# Patient Record
Sex: Female | Born: 1950
Health system: Southern US, Community
[De-identification: ages and names within clinical notes are randomized; demographics above are authoritative.]

## PROBLEM LIST (undated history)

## (undated) DIAGNOSIS — D649 Anemia, unspecified: Secondary | ICD-10-CM

## (undated) DIAGNOSIS — I5189 Other ill-defined heart diseases: Secondary | ICD-10-CM

## (undated) DIAGNOSIS — M1990 Unspecified osteoarthritis, unspecified site: Secondary | ICD-10-CM

## (undated) DIAGNOSIS — D259 Leiomyoma of uterus, unspecified: Secondary | ICD-10-CM

## (undated) DIAGNOSIS — K5792 Diverticulitis of intestine, part unspecified, without perforation or abscess without bleeding: Secondary | ICD-10-CM

## (undated) DIAGNOSIS — G2581 Restless legs syndrome: Secondary | ICD-10-CM

## (undated) DIAGNOSIS — E559 Vitamin D deficiency, unspecified: Secondary | ICD-10-CM

## (undated) DIAGNOSIS — E039 Hypothyroidism, unspecified: Secondary | ICD-10-CM

## (undated) DIAGNOSIS — I2510 Atherosclerotic heart disease of native coronary artery without angina pectoris: Secondary | ICD-10-CM

## (undated) DIAGNOSIS — M797 Fibromyalgia: Secondary | ICD-10-CM

## (undated) DIAGNOSIS — F32A Depression, unspecified: Secondary | ICD-10-CM

## (undated) DIAGNOSIS — S22000A Wedge compression fracture of unspecified thoracic vertebra, initial encounter for closed fracture: Secondary | ICD-10-CM

## (undated) DIAGNOSIS — M4712 Other spondylosis with myelopathy, cervical region: Secondary | ICD-10-CM

## (undated) DIAGNOSIS — F419 Anxiety disorder, unspecified: Secondary | ICD-10-CM

## (undated) DIAGNOSIS — M4722 Other spondylosis with radiculopathy, cervical region: Secondary | ICD-10-CM

## (undated) DIAGNOSIS — IMO0001 Reserved for inherently not codable concepts without codable children: Secondary | ICD-10-CM

## (undated) DIAGNOSIS — E785 Hyperlipidemia, unspecified: Secondary | ICD-10-CM

## (undated) DIAGNOSIS — I639 Cerebral infarction, unspecified: Secondary | ICD-10-CM

## (undated) DIAGNOSIS — Z87442 Personal history of urinary calculi: Secondary | ICD-10-CM

## (undated) DIAGNOSIS — I82409 Acute embolism and thrombosis of unspecified deep veins of unspecified lower extremity: Secondary | ICD-10-CM

## (undated) DIAGNOSIS — I10 Essential (primary) hypertension: Secondary | ICD-10-CM

## (undated) DIAGNOSIS — N809 Endometriosis, unspecified: Secondary | ICD-10-CM

## (undated) DIAGNOSIS — Z9889 Other specified postprocedural states: Secondary | ICD-10-CM

## (undated) DIAGNOSIS — R112 Nausea with vomiting, unspecified: Secondary | ICD-10-CM

## (undated) DIAGNOSIS — I351 Nonrheumatic aortic (valve) insufficiency: Secondary | ICD-10-CM

## (undated) DIAGNOSIS — Z5189 Encounter for other specified aftercare: Secondary | ICD-10-CM

## (undated) DIAGNOSIS — J189 Pneumonia, unspecified organism: Secondary | ICD-10-CM

## (undated) DIAGNOSIS — F329 Major depressive disorder, single episode, unspecified: Secondary | ICD-10-CM

## (undated) DIAGNOSIS — K219 Gastro-esophageal reflux disease without esophagitis: Secondary | ICD-10-CM

## (undated) DIAGNOSIS — G4700 Insomnia, unspecified: Secondary | ICD-10-CM

## (undated) DIAGNOSIS — Z8489 Family history of other specified conditions: Secondary | ICD-10-CM

## (undated) DIAGNOSIS — N189 Chronic kidney disease, unspecified: Secondary | ICD-10-CM

## (undated) DIAGNOSIS — R519 Headache, unspecified: Secondary | ICD-10-CM

## (undated) DIAGNOSIS — I509 Heart failure, unspecified: Secondary | ICD-10-CM

## (undated) DIAGNOSIS — N83209 Unspecified ovarian cyst, unspecified side: Secondary | ICD-10-CM

## (undated) DIAGNOSIS — K449 Diaphragmatic hernia without obstruction or gangrene: Secondary | ICD-10-CM

## (undated) DIAGNOSIS — D374 Neoplasm of uncertain behavior of colon: Secondary | ICD-10-CM

## (undated) DIAGNOSIS — N390 Urinary tract infection, site not specified: Secondary | ICD-10-CM

## (undated) HISTORY — DX: Endometriosis, unspecified: N80.9

## (undated) HISTORY — DX: Unspecified osteoarthritis, unspecified site: M19.90

## (undated) HISTORY — DX: Acute embolism and thrombosis of unspecified deep veins of unspecified lower extremity: I82.409

## (undated) HISTORY — DX: Vitamin D deficiency, unspecified: E55.9

## (undated) HISTORY — DX: Atherosclerotic heart disease of native coronary artery without angina pectoris: I25.10

## (undated) HISTORY — DX: Diaphragmatic hernia without obstruction or gangrene: K44.9

## (undated) HISTORY — DX: Anemia, unspecified: D64.9

## (undated) HISTORY — DX: Insomnia, unspecified: G47.00

## (undated) HISTORY — DX: Unspecified ovarian cyst, unspecified side: N83.209

## (undated) HISTORY — DX: Hyperlipidemia, unspecified: E78.5

## (undated) HISTORY — PX: TUBAL LIGATION: SHX77

## (undated) HISTORY — PX: OTHER SURGICAL HISTORY: SHX169

## (undated) HISTORY — DX: Restless legs syndrome: G25.81

## (undated) HISTORY — PX: JOINT REPLACEMENT: SHX530

## (undated) HISTORY — DX: Nonrheumatic aortic (valve) insufficiency: I35.1

## (undated) HISTORY — DX: Wedge compression fracture of unspecified thoracic vertebra, initial encounter for closed fracture: S22.000A

## (undated) HISTORY — DX: Other ill-defined heart diseases: I51.89

## (undated) HISTORY — DX: Diverticulitis of intestine, part unspecified, without perforation or abscess without bleeding: K57.92

## (undated) HISTORY — PX: CHOLECYSTECTOMY: SHX55

## (undated) HISTORY — PX: SHOULDER OPEN ROTATOR CUFF REPAIR: SHX2407

## (undated) HISTORY — PX: REDUCTION MAMMAPLASTY: SUR839

## (undated) HISTORY — PX: BREAST SURGERY: SHX581

## (undated) HISTORY — DX: Leiomyoma of uterus, unspecified: D25.9

## (undated) HISTORY — PX: APPENDECTOMY: SHX54

## (undated) HISTORY — PX: EYE SURGERY: SHX253

## (undated) HISTORY — PX: DILATION AND CURETTAGE OF UTERUS: SHX78

## (undated) HISTORY — PX: KNEE ARTHROPLASTY: SHX992

---

## 1997-12-05 ENCOUNTER — Ambulatory Visit (HOSPITAL_COMMUNITY): Admission: RE | Admit: 1997-12-05 | Discharge: 1997-12-05 | Payer: Self-pay | Admitting: Family Medicine

## 1998-01-06 ENCOUNTER — Encounter: Admission: RE | Admit: 1998-01-06 | Discharge: 1998-04-06 | Payer: Self-pay | Admitting: Family Medicine

## 1998-06-02 ENCOUNTER — Ambulatory Visit (HOSPITAL_COMMUNITY): Admission: RE | Admit: 1998-06-02 | Discharge: 1998-06-02 | Payer: Self-pay | Admitting: *Deleted

## 1999-06-14 ENCOUNTER — Encounter: Admission: RE | Admit: 1999-06-14 | Discharge: 1999-06-14 | Payer: Self-pay | Admitting: Family Medicine

## 1999-06-14 ENCOUNTER — Encounter: Payer: Self-pay | Admitting: Family Medicine

## 1999-12-15 ENCOUNTER — Other Ambulatory Visit: Admission: RE | Admit: 1999-12-15 | Discharge: 1999-12-15 | Payer: Self-pay | Admitting: Obstetrics and Gynecology

## 2000-01-04 ENCOUNTER — Encounter (HOSPITAL_COMMUNITY): Admission: RE | Admit: 2000-01-04 | Discharge: 2000-04-03 | Payer: Self-pay | Admitting: Obstetrics and Gynecology

## 2000-05-23 ENCOUNTER — Ambulatory Visit (HOSPITAL_COMMUNITY): Admission: RE | Admit: 2000-05-23 | Discharge: 2000-05-23 | Payer: Self-pay | Admitting: *Deleted

## 2000-06-19 ENCOUNTER — Encounter: Payer: Self-pay | Admitting: Family Medicine

## 2000-06-19 ENCOUNTER — Encounter: Admission: RE | Admit: 2000-06-19 | Discharge: 2000-06-19 | Payer: Self-pay | Admitting: Family Medicine

## 2000-10-04 ENCOUNTER — Encounter: Admission: RE | Admit: 2000-10-04 | Discharge: 2001-01-02 | Payer: Self-pay | Admitting: Family Medicine

## 2000-12-13 ENCOUNTER — Other Ambulatory Visit: Admission: RE | Admit: 2000-12-13 | Discharge: 2000-12-13 | Payer: Self-pay | Admitting: Obstetrics and Gynecology

## 2000-12-27 ENCOUNTER — Encounter: Payer: Self-pay | Admitting: Obstetrics and Gynecology

## 2001-01-02 ENCOUNTER — Encounter (INDEPENDENT_AMBULATORY_CARE_PROVIDER_SITE_OTHER): Payer: Self-pay

## 2001-01-02 ENCOUNTER — Ambulatory Visit (HOSPITAL_COMMUNITY): Admission: RE | Admit: 2001-01-02 | Discharge: 2001-01-02 | Payer: Self-pay | Admitting: Obstetrics and Gynecology

## 2001-06-25 ENCOUNTER — Encounter: Admission: RE | Admit: 2001-06-25 | Discharge: 2001-06-25 | Payer: Self-pay | Admitting: Family Medicine

## 2001-06-25 ENCOUNTER — Encounter: Payer: Self-pay | Admitting: Family Medicine

## 2001-12-17 ENCOUNTER — Other Ambulatory Visit: Admission: RE | Admit: 2001-12-17 | Discharge: 2001-12-17 | Payer: Self-pay | Admitting: Obstetrics and Gynecology

## 2002-06-26 ENCOUNTER — Encounter: Payer: Self-pay | Admitting: Family Medicine

## 2002-06-26 ENCOUNTER — Encounter: Admission: RE | Admit: 2002-06-26 | Discharge: 2002-06-26 | Payer: Self-pay | Admitting: Family Medicine

## 2002-10-30 ENCOUNTER — Encounter: Payer: Self-pay | Admitting: Neurosurgery

## 2002-10-30 ENCOUNTER — Encounter: Admission: RE | Admit: 2002-10-30 | Discharge: 2002-10-30 | Payer: Self-pay | Admitting: Neurosurgery

## 2002-11-04 ENCOUNTER — Encounter: Admission: RE | Admit: 2002-11-04 | Discharge: 2002-12-04 | Payer: Self-pay | Admitting: Neurosurgery

## 2002-11-13 ENCOUNTER — Encounter: Admission: RE | Admit: 2002-11-13 | Discharge: 2002-11-13 | Payer: Self-pay | Admitting: Neurosurgery

## 2002-11-13 ENCOUNTER — Encounter: Payer: Self-pay | Admitting: Neurosurgery

## 2002-11-27 ENCOUNTER — Encounter: Admission: RE | Admit: 2002-11-27 | Discharge: 2002-11-27 | Payer: Self-pay | Admitting: Neurosurgery

## 2002-11-27 ENCOUNTER — Encounter: Payer: Self-pay | Admitting: Neurosurgery

## 2003-01-07 ENCOUNTER — Other Ambulatory Visit: Admission: RE | Admit: 2003-01-07 | Discharge: 2003-01-07 | Payer: Self-pay | Admitting: Obstetrics and Gynecology

## 2003-03-24 ENCOUNTER — Encounter: Admission: RE | Admit: 2003-03-24 | Discharge: 2003-04-17 | Payer: Self-pay | Admitting: Neurosurgery

## 2003-05-14 ENCOUNTER — Ambulatory Visit (HOSPITAL_COMMUNITY): Admission: RE | Admit: 2003-05-14 | Discharge: 2003-05-14 | Payer: Self-pay | Admitting: Orthopedic Surgery

## 2003-05-14 ENCOUNTER — Ambulatory Visit (HOSPITAL_BASED_OUTPATIENT_CLINIC_OR_DEPARTMENT_OTHER): Admission: RE | Admit: 2003-05-14 | Discharge: 2003-05-14 | Payer: Self-pay | Admitting: Orthopedic Surgery

## 2003-05-26 ENCOUNTER — Encounter: Admission: RE | Admit: 2003-05-26 | Discharge: 2003-06-24 | Payer: Self-pay | Admitting: Orthopedic Surgery

## 2003-06-30 ENCOUNTER — Encounter: Admission: RE | Admit: 2003-06-30 | Discharge: 2003-06-30 | Payer: Self-pay | Admitting: Family Medicine

## 2003-08-09 HISTORY — PX: BACK SURGERY: SHX140

## 2003-09-01 ENCOUNTER — Inpatient Hospital Stay (HOSPITAL_COMMUNITY): Admission: RE | Admit: 2003-09-01 | Discharge: 2003-09-05 | Payer: Self-pay | Admitting: Neurosurgery

## 2004-01-12 ENCOUNTER — Other Ambulatory Visit: Admission: RE | Admit: 2004-01-12 | Discharge: 2004-01-12 | Payer: Self-pay | Admitting: Obstetrics and Gynecology

## 2004-08-24 ENCOUNTER — Encounter: Admission: RE | Admit: 2004-08-24 | Discharge: 2004-08-24 | Payer: Self-pay | Admitting: Family Medicine

## 2004-09-10 ENCOUNTER — Ambulatory Visit (HOSPITAL_COMMUNITY): Admission: RE | Admit: 2004-09-10 | Discharge: 2004-09-10 | Payer: Self-pay | Admitting: Orthopedic Surgery

## 2004-09-10 ENCOUNTER — Ambulatory Visit (HOSPITAL_BASED_OUTPATIENT_CLINIC_OR_DEPARTMENT_OTHER): Admission: RE | Admit: 2004-09-10 | Discharge: 2004-09-10 | Payer: Self-pay | Admitting: Orthopedic Surgery

## 2005-06-08 ENCOUNTER — Ambulatory Visit (HOSPITAL_COMMUNITY): Admission: RE | Admit: 2005-06-08 | Discharge: 2005-06-08 | Payer: Self-pay | Admitting: Orthopedic Surgery

## 2005-06-17 ENCOUNTER — Observation Stay (HOSPITAL_COMMUNITY): Admission: EM | Admit: 2005-06-17 | Discharge: 2005-06-17 | Payer: Self-pay | Admitting: Emergency Medicine

## 2005-09-02 ENCOUNTER — Encounter: Admission: RE | Admit: 2005-09-02 | Discharge: 2005-09-02 | Payer: Self-pay | Admitting: Obstetrics and Gynecology

## 2005-10-06 ENCOUNTER — Other Ambulatory Visit: Admission: RE | Admit: 2005-10-06 | Discharge: 2005-10-06 | Payer: Self-pay | Admitting: Obstetrics & Gynecology

## 2005-10-27 ENCOUNTER — Encounter: Admission: RE | Admit: 2005-10-27 | Discharge: 2005-10-27 | Payer: Self-pay | Admitting: Obstetrics and Gynecology

## 2006-01-04 ENCOUNTER — Ambulatory Visit (HOSPITAL_BASED_OUTPATIENT_CLINIC_OR_DEPARTMENT_OTHER): Admission: RE | Admit: 2006-01-04 | Discharge: 2006-01-04 | Payer: Self-pay | Admitting: Orthopedic Surgery

## 2006-09-13 ENCOUNTER — Ambulatory Visit (HOSPITAL_BASED_OUTPATIENT_CLINIC_OR_DEPARTMENT_OTHER): Admission: RE | Admit: 2006-09-13 | Discharge: 2006-09-13 | Payer: Self-pay | Admitting: Orthopedic Surgery

## 2006-10-23 ENCOUNTER — Other Ambulatory Visit: Admission: RE | Admit: 2006-10-23 | Discharge: 2006-10-23 | Payer: Self-pay | Admitting: Obstetrics & Gynecology

## 2006-11-09 ENCOUNTER — Encounter: Admission: RE | Admit: 2006-11-09 | Discharge: 2006-11-09 | Payer: Self-pay | Admitting: Obstetrics and Gynecology

## 2007-04-15 ENCOUNTER — Encounter (INDEPENDENT_AMBULATORY_CARE_PROVIDER_SITE_OTHER): Payer: Self-pay | Admitting: Surgery

## 2007-04-15 ENCOUNTER — Observation Stay (HOSPITAL_COMMUNITY): Admission: EM | Admit: 2007-04-15 | Discharge: 2007-04-16 | Payer: Self-pay | Admitting: Emergency Medicine

## 2007-08-09 HISTORY — PX: KNEE ARTHROPLASTY: SHX992

## 2008-01-15 ENCOUNTER — Other Ambulatory Visit: Admission: RE | Admit: 2008-01-15 | Discharge: 2008-01-15 | Payer: Self-pay | Admitting: Obstetrics and Gynecology

## 2008-02-20 ENCOUNTER — Encounter: Admission: RE | Admit: 2008-02-20 | Discharge: 2008-02-20 | Payer: Self-pay | Admitting: Obstetrics and Gynecology

## 2009-02-20 ENCOUNTER — Encounter: Admission: RE | Admit: 2009-02-20 | Discharge: 2009-02-20 | Payer: Self-pay | Admitting: Obstetrics and Gynecology

## 2009-02-26 ENCOUNTER — Encounter: Admission: RE | Admit: 2009-02-26 | Discharge: 2009-02-26 | Payer: Self-pay | Admitting: Obstetrics and Gynecology

## 2009-03-09 ENCOUNTER — Inpatient Hospital Stay (HOSPITAL_COMMUNITY): Admission: RE | Admit: 2009-03-09 | Discharge: 2009-03-10 | Payer: Self-pay | Admitting: Orthopedic Surgery

## 2009-03-10 ENCOUNTER — Encounter (INDEPENDENT_AMBULATORY_CARE_PROVIDER_SITE_OTHER): Payer: Self-pay | Admitting: Orthopedic Surgery

## 2009-03-10 ENCOUNTER — Ambulatory Visit: Payer: Self-pay | Admitting: Surgery

## 2009-08-31 ENCOUNTER — Inpatient Hospital Stay (HOSPITAL_COMMUNITY): Admission: RE | Admit: 2009-08-31 | Discharge: 2009-09-03 | Payer: Self-pay | Admitting: Orthopedic Surgery

## 2009-09-18 ENCOUNTER — Ambulatory Visit (HOSPITAL_COMMUNITY): Admission: RE | Admit: 2009-09-18 | Discharge: 2009-09-18 | Payer: Self-pay | Admitting: Orthopedic Surgery

## 2010-03-04 ENCOUNTER — Encounter: Admission: RE | Admit: 2010-03-04 | Discharge: 2010-03-04 | Payer: Self-pay | Admitting: Obstetrics and Gynecology

## 2010-07-15 ENCOUNTER — Emergency Department (HOSPITAL_COMMUNITY): Admission: EM | Admit: 2010-07-15 | Discharge: 2010-03-20 | Payer: Self-pay | Admitting: Emergency Medicine

## 2010-08-29 ENCOUNTER — Encounter: Payer: Self-pay | Admitting: Obstetrics and Gynecology

## 2010-10-22 LAB — DIFFERENTIAL
Eosinophils Absolute: 0.1 10*3/uL (ref 0.0–0.7)
Eosinophils Relative: 1 % (ref 0–5)
Lymphs Abs: 1.7 10*3/uL (ref 0.7–4.0)
Monocytes Relative: 4 % (ref 3–12)

## 2010-10-22 LAB — URINALYSIS, ROUTINE W REFLEX MICROSCOPIC
Bilirubin Urine: NEGATIVE
Glucose, UA: >1000 mg/dL — AB
Hgb urine dipstick: NEGATIVE
Nitrite: POSITIVE — AB
Protein, ur: NEGATIVE mg/dL
Specific Gravity, Urine: 1.017 (ref 1.005–1.030)
Urobilinogen, UA: 0.2 mg/dL (ref 0.0–1.0)
pH: 7.5 (ref 5.0–8.0)

## 2010-10-22 LAB — URINE CULTURE

## 2010-10-22 LAB — ABO/RH: ABO/RH(D): O POS

## 2010-10-22 LAB — CBC
HCT: 37.3 % (ref 36.0–46.0)
Hemoglobin: 12.6 g/dL (ref 12.0–15.0)
MCH: 28.3 pg (ref 26.0–34.0)
MCV: 83.7 fL (ref 78.0–100.0)
RBC: 4.46 MIL/uL (ref 3.87–5.11)

## 2010-10-22 LAB — TYPE AND SCREEN: Antibody Screen: NEGATIVE

## 2010-10-22 LAB — URINE MICROSCOPIC-ADD ON

## 2010-10-22 LAB — POCT I-STAT, CHEM 8
Calcium, Ion: 1.09 mmol/L — ABNORMAL LOW (ref 1.12–1.32)
Chloride: 102 mEq/L (ref 96–112)
HCT: 39 % (ref 36.0–46.0)
Hemoglobin: 13.3 g/dL (ref 12.0–15.0)
Potassium: 3.6 mEq/L (ref 3.5–5.1)

## 2010-10-22 LAB — GLUCOSE, CAPILLARY: Glucose-Capillary: 235 mg/dL — ABNORMAL HIGH (ref 70–99)

## 2010-10-24 LAB — CBC
HCT: 26.3 % — ABNORMAL LOW (ref 36.0–46.0)
HCT: 28.4 % — ABNORMAL LOW (ref 36.0–46.0)
HCT: 32.2 % — ABNORMAL LOW (ref 36.0–46.0)
HCT: 38.7 % (ref 36.0–46.0)
Hemoglobin: 9.6 g/dL — ABNORMAL LOW (ref 12.0–15.0)
MCHC: 33.8 g/dL (ref 30.0–36.0)
MCV: 83.8 fL (ref 78.0–100.0)
MCV: 84 fL (ref 78.0–100.0)
MCV: 84.3 fL (ref 78.0–100.0)
Platelets: 162 10*3/uL (ref 150–400)
Platelets: 267 10*3/uL (ref 150–400)
RBC: 3.39 MIL/uL — ABNORMAL LOW (ref 3.87–5.11)
RBC: 3.83 MIL/uL — ABNORMAL LOW (ref 3.87–5.11)
RDW: 13.1 % (ref 11.5–15.5)
WBC: 5.4 10*3/uL (ref 4.0–10.5)
WBC: 7 10*3/uL (ref 4.0–10.5)
WBC: 9.4 10*3/uL (ref 4.0–10.5)

## 2010-10-24 LAB — BASIC METABOLIC PANEL
BUN: 8 mg/dL (ref 6–23)
CO2: 27 mEq/L (ref 19–32)
Chloride: 100 mEq/L (ref 96–112)
Chloride: 102 mEq/L (ref 96–112)
Chloride: 99 mEq/L (ref 96–112)
GFR calc Af Amer: >60 mL/min (ref >60)
GFR calc Af Amer: >60 mL/min (ref >60)
GFR calc non Af Amer: >60 mL/min (ref >60)
Glucose, Bld: 258 mg/dL — ABNORMAL HIGH (ref 70–99)
Potassium: 3.5 mEq/L (ref 3.5–5.1)
Potassium: 3.8 mEq/L (ref 3.5–5.1)
Potassium: 4 mEq/L (ref 3.5–5.1)
Sodium: 135 mEq/L (ref 135–145)

## 2010-10-24 LAB — CROSSMATCH: ABO/RH(D): O POS

## 2010-10-24 LAB — GLUCOSE, CAPILLARY
Glucose-Capillary: 165 mg/dL — ABNORMAL HIGH (ref 70–99)
Glucose-Capillary: 226 mg/dL — ABNORMAL HIGH (ref 70–99)
Glucose-Capillary: 239 mg/dL — ABNORMAL HIGH (ref 70–99)
Glucose-Capillary: 302 mg/dL — ABNORMAL HIGH (ref 70–99)
Glucose-Capillary: 316 mg/dL — ABNORMAL HIGH (ref 70–99)

## 2010-10-24 LAB — COMPREHENSIVE METABOLIC PANEL
ALT: 15 U/L (ref 0–35)
AST: 17 U/L (ref 0–37)
Albumin: 3.9 g/dL (ref 3.5–5.2)
Alkaline Phosphatase: 90 U/L (ref 39–117)
BUN: 10 mg/dL (ref 6–23)
Chloride: 100 mEq/L (ref 96–112)
Potassium: 4.6 mEq/L (ref 3.5–5.1)
Sodium: 135 mEq/L (ref 135–145)
Total Bilirubin: 0.5 mg/dL (ref 0.3–1.2)
Total Protein: 6.7 g/dL (ref 6.0–8.3)

## 2010-10-24 LAB — URINALYSIS, ROUTINE W REFLEX MICROSCOPIC
Bilirubin Urine: NEGATIVE
Glucose, UA: >1000 mg/dL — AB
Hgb urine dipstick: NEGATIVE
Ketones, ur: 15 mg/dL — AB
Nitrite: NEGATIVE
Specific Gravity, Urine: 1.036 — ABNORMAL HIGH (ref 1.005–1.030)
pH: 5 (ref 5.0–8.0)

## 2010-10-24 LAB — DIFFERENTIAL
Basophils Absolute: 0 10*3/uL (ref 0.0–0.1)
Basophils Relative: 0 % (ref 0–1)
Eosinophils Absolute: 0.1 10*3/uL (ref 0.0–0.7)
Eosinophils Relative: 2 % (ref 0–5)
Monocytes Absolute: 0.4 10*3/uL (ref 0.1–1.0)
Monocytes Relative: 6 % (ref 3–12)
Neutro Abs: 4.5 10*3/uL (ref 1.7–7.7)

## 2010-10-24 LAB — URINE CULTURE: Colony Count: NO GROWTH

## 2010-10-24 LAB — URINE MICROSCOPIC-ADD ON

## 2010-11-09 ENCOUNTER — Emergency Department (HOSPITAL_COMMUNITY): Payer: 59

## 2010-11-09 ENCOUNTER — Emergency Department (HOSPITAL_COMMUNITY)
Admission: EM | Admit: 2010-11-09 | Discharge: 2010-11-09 | Disposition: A | Discharge status: Home / Self Care | Payer: 59 | Attending: Emergency Medicine | Admitting: Emergency Medicine

## 2010-11-09 DIAGNOSIS — R000 Tachycardia, unspecified: Secondary | ICD-10-CM | POA: Insufficient documentation

## 2010-11-09 DIAGNOSIS — M549 Dorsalgia, unspecified: Secondary | ICD-10-CM | POA: Insufficient documentation

## 2010-11-09 DIAGNOSIS — Z794 Long term (current) use of insulin: Secondary | ICD-10-CM | POA: Insufficient documentation

## 2010-11-09 DIAGNOSIS — N3090 Cystitis, unspecified without hematuria: Secondary | ICD-10-CM | POA: Insufficient documentation

## 2010-11-09 DIAGNOSIS — Z7982 Long term (current) use of aspirin: Secondary | ICD-10-CM | POA: Insufficient documentation

## 2010-11-09 DIAGNOSIS — E119 Type 2 diabetes mellitus without complications: Secondary | ICD-10-CM | POA: Insufficient documentation

## 2010-11-09 DIAGNOSIS — R109 Unspecified abdominal pain: Secondary | ICD-10-CM | POA: Insufficient documentation

## 2010-11-09 DIAGNOSIS — R110 Nausea: Secondary | ICD-10-CM | POA: Insufficient documentation

## 2010-11-09 DIAGNOSIS — E039 Hypothyroidism, unspecified: Secondary | ICD-10-CM | POA: Insufficient documentation

## 2010-11-09 DIAGNOSIS — M1990 Unspecified osteoarthritis, unspecified site: Secondary | ICD-10-CM | POA: Insufficient documentation

## 2010-11-09 DIAGNOSIS — Z79899 Other long term (current) drug therapy: Secondary | ICD-10-CM | POA: Insufficient documentation

## 2010-11-09 DIAGNOSIS — I10 Essential (primary) hypertension: Secondary | ICD-10-CM | POA: Insufficient documentation

## 2010-11-09 LAB — DIFFERENTIAL
Basophils Relative: 0 % (ref 0–1)
Lymphs Abs: 1.2 10*3/uL (ref 0.7–4.0)
Monocytes Relative: 8 % (ref 3–12)
Neutro Abs: 5.8 10*3/uL (ref 1.7–7.7)
Neutrophils Relative %: 75 % (ref 43–77)

## 2010-11-09 LAB — URINE MICROSCOPIC-ADD ON

## 2010-11-09 LAB — BASIC METABOLIC PANEL
BUN: 12 mg/dL (ref 6–23)
Calcium: 9.6 mg/dL (ref 8.4–10.5)
Creatinine, Ser: 0.8 mg/dL (ref 0.4–1.2)
GFR calc non Af Amer: >60 mL/min (ref >60)
Potassium: 3.9 mEq/L (ref 3.5–5.1)

## 2010-11-09 LAB — CBC
Hemoglobin: 13.5 g/dL (ref 12.0–15.0)
MCH: 28 pg (ref 26.0–34.0)
RBC: 4.83 MIL/uL (ref 3.87–5.11)
WBC: 7.7 10*3/uL (ref 4.0–10.5)

## 2010-11-09 LAB — URINALYSIS, ROUTINE W REFLEX MICROSCOPIC
Glucose, UA: NEGATIVE mg/dL
Ketones, ur: 15 mg/dL — AB
Specific Gravity, Urine: 1.023 (ref 1.005–1.030)
pH: 7 (ref 5.0–8.0)

## 2010-11-12 LAB — URINE CULTURE

## 2010-11-13 LAB — CBC
HCT: 31.8 % — ABNORMAL LOW (ref 36.0–46.0)
Hemoglobin: 10.9 g/dL — ABNORMAL LOW (ref 12.0–15.0)
MCV: 83.1 fL (ref 78.0–100.0)
Platelets: 201 10*3/uL (ref 150–400)
RDW: 13.1 % (ref 11.5–15.5)

## 2010-11-13 LAB — GLUCOSE, CAPILLARY
Glucose-Capillary: 200 mg/dL — ABNORMAL HIGH (ref 70–99)
Glucose-Capillary: 243 mg/dL — ABNORMAL HIGH (ref 70–99)
Glucose-Capillary: 338 mg/dL — ABNORMAL HIGH (ref 70–99)
Glucose-Capillary: 342 mg/dL — ABNORMAL HIGH (ref 70–99)

## 2010-11-13 LAB — BASIC METABOLIC PANEL
BUN: 14 mg/dL (ref 6–23)
CO2: 28 mEq/L (ref 19–32)
Chloride: 100 mEq/L (ref 96–112)
GFR calc non Af Amer: >60 mL/min (ref >60)
Glucose, Bld: 363 mg/dL — ABNORMAL HIGH (ref 70–99)
Potassium: 4.2 mEq/L (ref 3.5–5.1)
Sodium: 135 mEq/L (ref 135–145)

## 2010-11-14 LAB — CROSSMATCH

## 2010-11-14 LAB — COMPREHENSIVE METABOLIC PANEL
Alkaline Phosphatase: 91 U/L (ref 39–117)
BUN: 13 mg/dL (ref 6–23)
Chloride: 100 mEq/L (ref 96–112)
Creatinine, Ser: 0.76 mg/dL (ref 0.4–1.2)
GFR calc non Af Amer: >60 mL/min (ref >60)
Glucose, Bld: 89 mg/dL (ref 70–99)
Potassium: 3.9 mEq/L (ref 3.5–5.1)
Total Bilirubin: 0.5 mg/dL (ref 0.3–1.2)

## 2010-11-14 LAB — DIFFERENTIAL
Basophils Absolute: 0 10*3/uL (ref 0.0–0.1)
Basophils Relative: 0 % (ref 0–1)
Lymphocytes Relative: 35 % (ref 12–46)
Neutro Abs: 4.3 10*3/uL (ref 1.7–7.7)
Neutrophils Relative %: 56 % (ref 43–77)

## 2010-11-14 LAB — URINE CULTURE

## 2010-11-14 LAB — URINALYSIS, ROUTINE W REFLEX MICROSCOPIC
Bilirubin Urine: NEGATIVE
Ketones, ur: NEGATIVE mg/dL
Nitrite: NEGATIVE
Protein, ur: NEGATIVE mg/dL
pH: 6 (ref 5.0–8.0)

## 2010-11-14 LAB — PROTIME-INR
INR: 0.9 (ref 0.00–1.49)
Prothrombin Time: 12.3 seconds (ref 11.6–15.2)

## 2010-11-14 LAB — URINE MICROSCOPIC-ADD ON

## 2010-11-14 LAB — CBC
HCT: 40.3 % (ref 36.0–46.0)
Hemoglobin: 13.7 g/dL (ref 12.0–15.0)
MCV: 83.9 fL (ref 78.0–100.0)
WBC: 7.6 10*3/uL (ref 4.0–10.5)

## 2010-11-14 LAB — ABO/RH: ABO/RH(D): O POS

## 2010-11-14 LAB — APTT: aPTT: 29 seconds (ref 24–37)

## 2010-12-21 NOTE — Op Note (Signed)
[name], [name]                 ACCOUNT NO.:  [id number]   MEDICAL RECORD NO.:  [id number]          PATIENT TYPE:  INP   LOCATION:  1823                         FACILITY:  [facility]   PHYSICIAN:  Thornton Park. Daphine Deutscher, MD  DATE OF BIRTH:  [birth year]   DATE OF PROCEDURE:  04/15/2007  DATE OF DISCHARGE:                               OPERATIVE REPORT   PREOPERATIVE DIAGNOSIS:  A 60 year old white female with underlying  diabetes, hypothyroidism and hypertension with acute appendicitis.  She  has had multiple previous surgeries.   PROCEDURE:  Laparoscopic appendectomy.   FINDINGS:  Acute suppurative appendicitis.   SURGEON:  Luretha Murphy, M.D.   ANESTHESIA:  General endotracheal.   DESCRIPTION OF PROCEDURE:  [name] was taken from emergency room up  to room 11 on Sunday evening April 15, 2007.  She received a gram of  Mefoxin preop.  The abdomen was prepped with Techni-Care and draped  sterilely.  A longitudinal incision was made down into the umbilicus  near an old incision.  Hasson cannula was placed without difficulty.  The abdomen was insufflated.  A 5 mm was placed in the right upper  quadrant and 11 was placed in the left lower quadrant.  This was  subsequently visualized from the right side with a 5 mm scope just to  see because there was an adhesion nearby.  This was well away from any  bowel.   The appendix was mobilized anteriorly and the base was isolated and was  transected with the Endo-GIA vascular cartridge.  Remaining portion of  the mesentery of appendix was transected with a harmonic scalpel and  appendix was placed in a bag and brought out through the umbilicus.  Port sites were all injected with Marcaine.  Surveyed this area again  and no bleeding was noted.  The abdomen was deflated after closing the  umbilical defect with figure-of-eight suture of zero Vicryl.  Wounds  were closed 4-0 Vicryl and Steri-Strips.  The patient seemed to tolerate  the  procedure well.  She was taken to recovery room in satisfactory  condition.      Thornton Park Daphine Deutscher, MD  Electronically Signed     MBM/MEDQ  D:  04/15/2007  T:  04/16/2007  Job:  [id number]

## 2010-12-21 NOTE — Op Note (Signed)
NAMEJENIN, [name]                 ACCOUNT NO.:  [id number]   MEDICAL RECORD NO.:  [id number]          PATIENT TYPE:  INP   LOCATION:  5002                         FACILITY:  [facility]   PHYSICIAN:  Mila Homer. Sherlean Foot, M.D. DATE OF BIRTH:  06/25/1952   DATE OF PROCEDURE:  03/09/2009  DATE OF DISCHARGE:                               OPERATIVE REPORT   SURGEON:  Mila Homer. Sherlean Foot, MD   ASSISTANTS:  Altamese Cabal, PA-C and Skip Mayer, PA-C   ANESTHESIA:  General.   PREOPERATIVE DIAGNOSIS:  Left medial compartment osteoarthritis.   POSTOPERATIVE DIAGNOSIS:  Left medial compartment osteoarthritis.   PROCEDURE:  Left knee unicompartmental arthroplasty.   INDICATIONS FOR PROCEDURE:  The patient is a 60 year old white female  who has failed conservative measures for osteoarthritis of the left  knee.  Informed consent was obtained.   DESCRIPTION OF PROCEDURE:  The patient was taken to the operating room.  After general anesthesia and a Foley catheter was placed, the leg was  prepped and draped in usual sterile fashion.  Exsanguinated the  extremity with the Esmarch and midline incision was made from the tibial  tubercle to the inferior pole of the patella approximately 5 cm in  length.  A new blade was used to make a median parapatellar arthrotomy  and perform a synovectomy.  I then placed a tibial anatomic axis guide  with a long alignment rod and got the C-arm out taking AP images to  ensure I was through the mechanical axis with tension on the knee  correcting it slightly from its varus orientation and pinned it into the  distal femoral proximal tibial cutting blocks.  I then removed the  alignment guide to make the tibial cut and the femoral cut.  I then went  into flexion made the tibial cut.  I then removed all of the cutting  guides.  I then sized the femur to a D, pinned the cutting guide into  place and made the chamfer and posterior condylar cuts.  I removed the  cut surfaces  and the guide.  I then checked the flexion/extension gap  balance with the spacer block.  I then finished the tibia with a size 3  tibial tray, then trialed with a 3 tibia, 8 poly, D femur and I got good  flexion/extension, gap balance and good alignment.  I then removed the  trial components and copiously irrigated.  I then cemented the  components and removed all excess cement, snapped the polyethylene in  place and allowed the cement to harden in extension.  I then let the  tourniquet down, irrigated and obtained hemostasis.  Closed the  arthrotomy with figure-of-eight #1 Vicryl sutures, #1 Vicryl sutures in  the arthrotomy, buried 0s, subcuticular 2-0 Steri-Strips, infiltrated  the capsule with Marcaine and morphine mixture.   TOURNIQUET TIME:  65 minutes.   COMPLICATIONS:  None.   DRAINS:  None.           ______________________________  Mila Homer. Sherlean Foot, M.D.     SDL/MEDQ  D:  03/09/2009  T:  03/10/2009  Job:  [id number]

## 2010-12-24 NOTE — Procedures (Signed)
[facility]  Patient:    [name], [name]                        MRN: [id number] Proc. Date: 05/23/00 Adm. Date:  [id number] Attending:  Mingo Amber CC:         [name]. Cliffton Asters, M.D.   Procedure Report  PROCEDURES:  Video upper endoscopy and video colonoscopy.  INDICATIONS:  Persisting iron deficiency anemia in a 60 year old diabetic female without symptoms.  It should be noted that antibody test for celiac sprue was negative.  Recent hemoglobin was 8.7 with an MCV of 59.7.  PREPARATION:  The patient is n.p.o. since midnight, having taken Phospho-Soda prep and a clear liquid diet.  The mucosa throughout is clean.  PREPROCEDURE SEDATION:  Prior to the colonoscopy, she received 75 mg of Demerol and 8 mg of Versed intravenously.  In addition, she was on 2 L of nasal cannula O2.  DESCRIPTION OF PROCEDURE:  The Olympus video colonoscope was inserted via the rectum and advanced quite easily all the way to the cecum.  Cecal landmarks were identified and photographed.  On withdrawal, the mucosa was carefully evaluated and found to be virtually entirely normal.  There were minimal diverticula noted on the left colon.  There were no areas of inflammation, neoplasia, vascular dysplasia, or other abnormality.  Retroflexed view of the rectum was unremarkable.  The patient tolerated this procedure well.  At its conclusion, her position was reversed for procedure #2, video upper endoscopy. She received an additional 2 mg of Versed and 25 mg of Demerol.  In addition, her throat was anesthetized with Hurricaine spray.  The Olympus video upper endoscope was inserted via the mouth and advanced easily through the upper esophageal sphincter.  Intubation was carried out well into the descending duodenum.  On withdrawal, the mucosa was carefully evaluated.  Descending duodenum and bulb appeared normal, as did the entire stomach and esophagus. There were no areas of  peptic inflammation, vascular abnormality, or bleeding noted.  The patient tolerated the procedure well.  Pulse, blood pressure, and oximetry testing were stable throughout.  She will be observed in recovery for one hour and if stable, discharged home for outpatient follow-up.  IMPRESSION: 1. Normal colonoscopy with minimal left-sided diverticula. 2. Normal upper endoscopy.  RECOMMENDATIONS:  The patient should be on iron supplementation three times daily along with vitamin C, which may facilitate its absorption.  CBC should be checked within six weeks.  If anemia is not correcting or worsening, a small bowel series should be performed. DD:  05/23/00 TD:  05/23/00 Job: [id number]

## 2010-12-24 NOTE — Op Note (Signed)
[name], [name]                           ACCOUNT NO.:  [id number]   MEDICAL RECORD NO.:  [id number]                   PATIENT TYPE:  AMB   LOCATION:  DSC                                  FACILITY:  [facility]   PHYSICIAN:  Mila Homer. Sherlean Foot, M.D.              DATE OF BIRTH:  [birth year]   DATE OF PROCEDURE:  DATE OF DISCHARGE:                                 OPERATIVE REPORT   PREOPERATIVE DIAGNOSIS:  Left shoulder impingement with rotator cuff tear.   POSTOPERATIVE DIAGNOSIS:  Left shoulder impingement with rotator cuff tear  and labral tear.   PROCEDURE:  Left shoulder arthroscopy with mini open rotator cuff,  arthroscopic subacromial decompression and arthroscopic glenohumeral  debridement of the labral tear.   SURGEON:  Mila Homer. Sherlean Foot, M.D.   ASSISTANT:  Richardean Canal, P.A.   ANESTHESIA:  General plus intraoperative scalene block.   INDICATIONS:  The patient is a 60 year old with MRI and clinical evidence of  rotator cuff tearing.  Informed consent was obtained.   DESCRIPTION OF PROCEDURE:  The patient was laid supine and administered  general anesthesia, placed in the beach chair position.  The left shoulder  was prepped and draped in the usual sterile fashion.  Anterior and posterior  portals were created with a #11 blade and blunt trocar.  The glenohumeral  diagnostic arthroscopy revealed no chondromalacia but a frayed chronic type  tear tissue within the glenohumeral joint.  A 3.5 Gator shaver was used  through the anterior portal to debride the labral tear back to a stable rim  of tissue.  The undersurface of the rotator cuff looked pretty good but  there was some thinning at the leading edge of the supraspinatus.  We then  went from posterior lateral to the subacromial space, created a direct  lateral portal and went into the subacromial space there as well.  I then  performed a subtotal bursectomy.  I then used the ArthroCare 90 degree  device to debride the  bursae off the undersurface of the acromion.  I then  identified the anterior lateral corner and the type 3 acromion space there  as well.  I then performed a subtotal bursectomy.  I then used the  ArthroCare 90 degree device to debride the bursae off the undersurface of  the acromion. I then identified the anterior lateral corner and the type 3  acromion and performed an aggressive anterior lateral acromioplasty with a  4.0 mm cylindrical burr.  I then released the CA ligament and cauterized the  CA artery.  I debrided it back to a stump which was not causing any further  impingement.  I then used the shaver and aggressively debrided the rotator  cuff in the area of the suspicion from the glenohumeral arthroscopy and  found that there was a small full thickness tear.  At this point, I aborted  the arthroscopic portion of  the case and converted to a mini open incision  in extending our direct lateral portal up 2 cm.  I pierced the deltoid in  line with its raphe and held it in place with the arthroscopic retractor.  I  could see joint fluid coming out an approximately 0.5 cm tear which was an  avulsion type tear.  There was also spur directly underneath it which was  growing out and probably the area of impingement.  I ellipsed the tissue  tear, debrided the spur and then placed a 5.0 mm absorbable anchor from  Arthrex and performed two simple sutures tying it down through that.  This  nicely brought it back together with very little tension, and there was no  impingement through a range of motion of this area.  I then  irrigated and closed the deltoid with interrupted 0 Vicryl sutures, deep  soft tissues with interrupted 2-0 Vicryl sutures and then Steri-Strips and 4-  0 nylon in the portals.  I then dressed with Adaptic, dressings, sponges,  ABD, 2 inch silk tape and a sling and swathe.  The patient tolerated the  procedure well.  Complications none.  Drains none.                                                Mila Homer. Sherlean Foot, M.D.    SDL/MEDQ  D:  05/14/2003  T:  05/14/2003  Job:  [id number]

## 2010-12-24 NOTE — Op Note (Signed)
[facility]  Patient:    [name], [name]                        MRN: [id number] Proc. Date: 01/02/01 Adm. Date:  [id number] Attending:  Brynda Peon                           Operative Report  PREOPERATIVE DIAGNOSES:  Menorrhagia, known uterine fibroids, no endometrial defect on sonohysterogram.  POSTOPERATIVE DIAGNOSES:  Menorrhagia, known uterine fibroids, no endometrial defect on sonohysterogram. Path pending.  PROCEDURE:  Hysteroscopic resection of endometrial lesion, D&C.  SURGEON:  Dr. Amedeo Kinsman.  ANESTHESIA:  Modified anesthesia care plus paracervical block.  ESTIMATED BLOOD LOSS:  Minimal.  COMPLICATIONS:  None.  SORBITOL DEFICIT:  Essentially zero.  DESCRIPTION OF PROCEDURE:  The patient was taken to the operating room and after the IV sedation per anesthesia, she was placed in dorsal lithotomy position and prepped and draped in the usual fashion and bladder drained with a red rubber catheter. The cervix was grasped on its anterior lip with a single tooth tenaculum, paracervical block was instituted by injecting 10 cc of 1% xylocaine with epinephrine at each of 3 and 9 oclock. The uterus then sounded to 3.5 inches. The cervix easily dilated to a #31 Shawnie Pons and the operative hysteroscope was introduced. There were multiple endometrial polyps. These were resected with the wire loop. Bleeding points were then coagulated with the roller ball after a curettage was done. Specimens were sent to pathology, instruments removed from the vagina and the procedure was terminated. The patient tolerated it well and went in satisfactory condition to post anesthesia recovery. DD:  01/02/01 TD:  01/02/01 Job: [id number]

## 2010-12-24 NOTE — Discharge Summary (Signed)
[name], [name]                 ACCOUNT NO.:  [id number]   MEDICAL RECORD NO.:  [id number]          PATIENT TYPE:  INP   LOCATION:  6703                         FACILITY:  [facility]   PHYSICIAN:  Theone Stanley, MD   DATE OF BIRTH:  August 18, 1950   DATE OF ADMISSION:  06/16/2005  DATE OF DISCHARGE:  06/17/2005                                 DISCHARGE SUMMARY   ADMITTING DIAGNOSES:  1.  Chest pain.  2.  History of diabetes.  3.  History of hypertension.  4.  History of hypercholesterolemia.  5.  History of hypothyroidism.  6.  History of a right rotator cuff tear.   DISCHARGE DIAGNOSES:  1.  Atypical chest pain most likely non-cardiac in etiology.  2.  History of diabetes.  3.  History of hypertension.  4.  History of hypercholesterolemia.  5.  History of hypothyroidism.  6.  History of a right rotator cuff tear.   PROCEDURES/DIAGNOSTIC TESTS:  None.   CONSULTATIONS:  None.   HOSPITAL COURSE:  [name] is a very pleasant, 60 year old female  presenting with chest pain.  This pain appears to be intermittent in nature  and has been going on for a week or so.  The day prior to her admission, she  had an episode at night.  She took some cold medication and went to sleep,  and the pain went away on its own.  However, on the 9th, while she was  driving home from work, she had a similar episode of pain on the right side,  to the left shoulder, around to the back of the scapula, a 5 out of 10.  When she went home, she told her son, who is an EMT.  He took her blood  pressure at that time and felt that because of her risk factors she should  go to the hospital for further evaluation.  She was not able to correlate  any diaphoresis, nausea, or vomiting with the chest pain.  Besides this  pain, she has been feeling fine overall.  It appears that she has had poor  control over her diabetes, and her hemoglobin A1c is 10.  The pain went away  on its own.  She did receive some aspirin.   On her admission, she had an  EKG, which was normal.  She had serial enzymes, which were all normal.  Telemetry did not show any arrhythmias or abnormalities.  A TSH was checked  on this patient and it was noted that it was elevated at 7.149.  Because of  this, it was felt that she should have an increase in her Synthroid.  By the  next day, the patient was requesting to go home.  After discussing the case  with [facility], it was felt that since she was asymptomatic and there  was no evidence of ischemia on labs or EKG, it was safe to send her home  with a stress test so she was set up with a stress test with [facility] on November 15th at 9 a.m.  The patient understood  this and was  sent home in stable condition.   DISCHARGE MEDICATIONS:  1.  Ambien p.r.n.  2.  Lantus 24 units q.h.s.  3.  Metformin 500 mg q.i.d.  4.  Zetia 10 mg daily.  5.  Benicar 12.5 mg daily.  6.  Levothyroxine 75 mg daily.  7.  Humalog 10 units at breakfast, 12 at lunch, and 10 at dinner.  8.  Pravachol 625 mg 4 tabs daily.  9.  She was instructed to take an aspirin a day.  In addition, she was given      some nitro and instructed on its use and its side effects.  She was      given nitro sublingual.  10. Protonix 40 mg, 1 p.o. daily.   FOLLOWUP:  The patient is to follow up with [facility] on November  15th.  She is to follow up with Dr. [name] in two to three weeks.      Theone Stanley, MD  Electronically Signed     AEJ/MEDQ  D:  06/18/2005  T:  06/19/2005  Job:  [id number]   cc:   [name]. Cliffton Asters, M.D.  Fax: [phone]   Meade Maw, M.D.  Fax: [phone]

## 2010-12-24 NOTE — Discharge Summary (Signed)
[name], [name]                 ACCOUNT NO.:  [id number]   MEDICAL RECORD NO.:  [id number]          PATIENT TYPE:  INP   LOCATION:  5707                         FACILITY:  [facility]   PHYSICIAN:  Adolph Pollack, M.D.DATE OF BIRTH:  06/25/1952   DATE OF ADMISSION:  04/15/2007  DATE OF DISCHARGE:  04/16/2007                               DISCHARGE SUMMARY   FINAL DIAGNOSIS:  1. Acute appendicitis.   SECONDARY DIAGNOSIS:  1. Diabetes mellitus.  2. Hypothyroidism.  3. Hypertension.   PROCEDURE:  Laparoscopic appendectomy with Dr. Luretha Murphy.   REASON FOR ADMISSION:  This is a 60 year old female admitted April 15, 2007 by Dr. Luretha Murphy.  She had some lower abdominal pain,  nausea and an evaluation with CT that demonstrated appendicitis.  She  had right lower quadrant tenderness and was admitted and taken to the  operating room.   HOSPITAL COURSE:  She underwent a laparoscopic appendectomy by Dr.  Daphine Deutscher April 15, 2007.  On the first postoperative day, her initial  blood sugar was 314.  She states that it is not uncommon for her to run  blood sugars greater than 300.  We will put her back on her medications  for her diabetes, and I told her that if her blood sugar was improved  later in the day, she could be discharged, which she was able to be  discharged.   DISPOSITION:  Discharged home April 16, 2007 in satisfactory  condition.  She will continue her home medications.  She was given  discharge instructions, medicine for pain.  I have told her that she  would need to talk to Dr. Cliffton Asters about getting better control of her  blood sugar.  She will follow up with Dr. Daphine Deutscher in 2 to 3 weeks.      Adolph Pollack, M.D.  Electronically Signed     TJR/MEDQ  D:  05/15/2007  T:  05/15/2007  Job:  [id number]

## 2010-12-24 NOTE — Op Note (Signed)
NAMEPARLEE, [name]                 ACCOUNT NO.:  [id number]   MEDICAL RECORD NO.:  [id number]          PATIENT TYPE:  AMB   LOCATION:  DSC                          FACILITY:  [facility]   PHYSICIAN:  Mila Homer. Sherlean Foot, M.D. DATE OF BIRTH:  01/16/1951   DATE OF PROCEDURE:  09/13/2006  DATE OF DISCHARGE:                               OPERATIVE REPORT   SURGEON:  Mila Homer. Sherlean Foot, M.D.   ASSISTANT:  None.   ANESTHESIA:  General.   PREOPERATIVE DIAGNOSIS:  Left knee medial meniscus tear, osteoarthritis  of medial plica.   POSTOPERATIVE DIAGNOSIS:  Left knee medial meniscus tear, osteoarthritis  of medial plica.   PROCEDURE:  Left knee arthroscopy, partial medial meniscectomy,  chondroplasty medial compartment and plica-ectomy.   INDICATIONS FOR PROCEDURE:  The patient is a 60 year old white female  with failure of conservative measures for osteoarthritis of the knee  plus mechanical symptoms of internal derangement and a preliminary  diagnosis of meniscus tearing.  Informed consent obtained.   DESCRIPTION OF PROCEDURE:  The patient was laid supine and administered  MAC anesthesia.  The left leg was prepped and draped in usual sterile  fashion.  Inferolateral and anteromedial portals were created with a #11  blade, blunt trocar and cannula.  Diagnostic arthroscopy revealed grade  1 chondromalacia on the patella, 2 in the depths of the trochlea, a  large parapatellar plica.  This was debrided with a [employer].  Medial compartment had grade 1 and 2 chondromalacia.  The lateral  compartment, however, was completely normal.  There was complex tearing  of the medial meniscus.  This was debrided with a straight basket  forceps and a Biochemist, clinical.  I then lavaged the three compartments  and copiously irrigated and closed with 4-0 nylon sutures, infiltrated  with 10 mL of Marcaine- morphine mixture in both knees and then dressed  with Xeroform dressing sponges, sterile Webril  and Ace wrap.   COMPLICATIONS:  None.   DRAINS:  None.           ______________________________  Mila Homer. Sherlean Foot, M.D.     SDL/MEDQ  D:  09/13/2006  T:  09/13/2006  Job:  [id number]

## 2010-12-24 NOTE — H&P (Signed)
[name], [name]                 ACCOUNT NO.:  [id number]   MEDICAL RECORD NO.:  [id number]          PATIENT TYPE:  INP   LOCATION:  1824                         FACILITY:  [facility]   PHYSICIAN:  Melissa L. Ladona Ridgel, MD  DATE OF BIRTH:  [birth year]   DATE OF ADMISSION:  06/16/2005  DATE OF DISCHARGE:                                HISTORY & PHYSICAL   CHIEF COMPLAINT:  Chest pain.   PRIMARY CARE PHYSICIAN:  [name]. Cliffton Asters, M.D.   HISTORY OF PRESENT ILLNESS:  The patient is a 60 year old white female who  started having chest pain last evening prior to going to sleep. She did not  do anything to make it better. Nothing made it worse. It just went away on  its own. Today, while driving home from work, she developed similar pain  describing it 5 out of 10 going from the right side to the left shoulder and  around to the back scapula. She went home. Her son who is an EMT took her  blood pressure, said that it was okay but insisted that she come to the  hospital for further evaluation. The patient was not given any medication  but she did have relief with rest. She states that she sweats a lot and  therefore could not correlate any symptoms of diaphoresis with her symptoms.  At the present time, she states that there was a little twinge in the right  shoulder area of pain but otherwise she felt fine.   REVIEW OF SYSTEMS:  No fevers, no chills, no jaundice, no vomiting, no  diarrhea. She had a little bit of headache. All other review of systems are  negative.   PAST MEDICAL HISTORY:  1.  Diabetes.  2.  Hypertension.  3.  Hypercholesterolemia.  4.  Hypothyroidism.  5.  She has a torn right rotator cuff for which she has been taking      Celebrex.   PAST SURGICAL HISTORY:  1.  Two C-sections.  2.  Cholecystectomy.  3.  Breast reduction.  4.  Spinal fusion.  5.  A left rotator cuff repair.  6.  Right knee repair.   SOCIAL HISTORY:  She denies tobacco and ethanol and she works as  a  Lawyer.   FAMILY HISTORY:  Mom is living with a history of breast cancer, diabetes,  and hypertension. Dad is deceased with diabetes, MI and hypertension.   ALLERGIES:  CODEINE.   MEDICATIONS:  1.  Ambien p.r.n.  2.  Lantus 24 units q.h.s.  3.  Metformin 500 milligrams p.o. q.i.d.  4.  Zetia 10 milligrams daily.  5.  Benicar 12.5 milligrams daily.  6.  Levothyroxine 50 milligrams daily.  7.  Humalog 10 units at breakfast, 12 units at lunch, and 10 units at      dinner.  8.  Welchol 625 milligrams 4 tablets daily.   PHYSICAL EXAMINATION:  VITAL SIGNS:  Temperature was 98.1, blood pressure  140/88, pulse 84, respirations 16. Saturation 99%.  GENERAL:  This is a well-developed, well-nourished, moderately obese white  female in no  acute distress.  HEENT:  She is normocephalic and atraumatic. Pupils are equal, round, and  reactive to light. Extraocular movements intact. Moist mucus membranes.  NECK:  Supple. There is no JVD, no lymph nodes, and no carotid bruits.  CHEST:  Clear to auscultation. There are no rhonchi, rales, or wheezes.  CARDIOVASCULAR:  Regular rate and rhythm. Positive S1 and S2. No S3, S4. No  murmurs, gallops, or rubs are noted.  ABDOMEN:  Soft, obese, nontender and nondistended with positive bowel  sounds.  EXTREMITIES:  No clubbing, cyanosis, or edema.  NEUROLOGICAL:  Cranial nerves II through XII intact. Power is 5/5. DTR's are  2+. Plantars are downgoing.   LABORATORY DATA:  CT angiogram was completed and negative for PE. Her  creatinine is 0.7. White cell count is 5.8 with a hemoglobin of 12.7,  hematocrit 37.3, platelets of 248,000. A BMET was not completed. PT/INR is  12.4 and 0.9 respectively. PTT is 27. Point of care enzymes are negative x3.  EKG shows 92 beats per minute with an incomplete bundle branch block. No Q-  waves or ST-T wave changes are noted.   ASSESSMENT:  This is a 60 year old white female with atypical chest pain  that  has been episodic in nature. It is not associated with diaphoresis,  shortness of breath. She does not have any paroxysm nocturnal dyspnea,  orthopnea, or dyspnea on exertion. CT angiogram was negative for pulmonary  embolus and at present the patient is pain-free.   PLAN:  1.  Cardiovascular:  Intermittent left and right chest pain of unclear      etiology. We will start her on aspirin, Lovenox 1 milligram per      kilogram. Continue her ACE inhibitor and start a low-dose beta blocker.      We will check serial cardiac enzymes and ask [facility] to see her      in the a.m. to decide whether an outpatient stress would be appropriate.  2.  Pulmonary:  No evidence for pulmonary embolus and no obvious congestive      heart failure.  3.  GI:  We will start her on Protonix.  4.  GU:  She has no current complaints or issues.  5.  Endocrine:  We will continue her Lantus and Humalog.  6.  Torn rotator cuff:  I will continue her Celebrex at this time.  7.  DVT prophylaxis will be Lovenox.      Melissa L. Ladona Ridgel, MD  Electronically Signed     MLT/MEDQ  D:  06/17/2005  T:  06/17/2005  Job:  [id number]   cc:   [name]. Cliffton Asters, M.D.  Fax: [phone]

## 2010-12-24 NOTE — H&P (Signed)
NAMEFRANCOISE, [name]                           ACCOUNT NO.:  [id number]   MEDICAL RECORD NO.:  [id number]                   PATIENT TYPE:  INP   LOCATION:  2899                                 FACILITY:  [facility]   PHYSICIAN:  Clydene Fake, M.D.               DATE OF BIRTH:  [birth year]   DATE OF ADMISSION:  09/01/2003  DATE OF DISCHARGE:                                HISTORY & PHYSICAL   CHIEF COMPLAINT:  Back and right leg pain and right leg numbness.   HISTORY:  The patient is a 60 year old woman who has had on and off back  pain for years, which has worsened since last February.  She has had  significant back pain radiating down her right leg.  She tried epidural  injections and she had some relief but only short lived and per the patient  she continues to have problems. On MRI and x-rays she is found to have  unstable spondylolisthesis at 4-5 with retrolisthesis at 5-1 and stenosis at  both 4-5 and 5-1 levels.  Because of the instability, the patient decided to  undergo decompression and fusion.   PAST MEDICAL HISTORY:  1. Hypertension.  2. Diabetes.   PAST SURGICAL HISTORY:  1. C-sections in 1977 and 1981.  2. Breast reduction in 1988.  3. Gallbladder in the early 90s.   ALLERGIES:  1. CODEINE.  2. PENICILLIN.   MEDICATIONS:  1. Metformin.  2. Dyazide.  3. Glipizide.  4. Lantus insulin.   SOCIAL HISTORY:  She is employed as a Interior and spatial designer.  She does not smoke or  use alcohol.   REVIEW OF SYSTEMS:  Otherwise unremarkable.   FAMILY HISTORY:  Noncontributory.   PHYSICAL EXAMINATION:  HEENT:  Unremarkable.  NECK:  Supple.  LUNGS:  Clear.  HEART:  Regular rate and rhythm.  ABDOMEN:  Soft, nontender.  EXTREMITIES:  Intact.  No edema.  NEUROLOGIC:  Shows gait is fairly normal, straight leg raise is positive on  the right, negative on the left. Motor strength is __________.  Sensation is  intact to light touch and pinprick. Deep tendon reflexes are diminished  bilaterally at the ankles.   PLAN:  The patient with back and leg pain.  Unstable spondylolisthesis and  stenosis at L4-5 and L5-S1.  Patient is admitted for decompression and  fusion.                                                Clydene Fake, M.D.    JRH/MEDQ  D:  09/01/2003  T:  09/01/2003  Job:  [id number]

## 2010-12-24 NOTE — Op Note (Signed)
[name], [name]                 ACCOUNT NO.:  [id number]   MEDICAL RECORD NO.:  [id number]          PATIENT TYPE:  AMB   LOCATION:  DSC                          FACILITY:  [facility]   PHYSICIAN:  Katy Fitch. Sypher Montez Hageman., M.D.DATE OF BIRTH:  07-23-1951   DATE OF PROCEDURE:  09/10/2004  DATE OF DISCHARGE:                                 OPERATIVE REPORT   PREOPERATIVE DIAGNOSIS:  Chronic stenosing tenosynovitis right first dorsal  compartment.   POSTOPERATIVE DIAGNOSIS:  Chronic stenosing tenosynovitis right first dorsal  compartment.   OPERATIONS:  Release of right first dorsal compartment and resection of  septum between abductor pollicis longus and extensor pollicis brevis  tendons.   OPERATING SURGEON:  Katy Fitch. Sypher, M.D.   ASSISTANT:  Marveen Reeks. Dasnoit, P.A.C.   ANESTHESIA:  Marcaine 0.25% and 2% lidocaine supplemented by  IV sedation.   SUPERVISING ANESTHESIOLOGIST:  Germaine Pomfret, M.D.   INDICATIONS:  [name] is a 60 year old woman referred for evaluation and  management of painful swelling on the radial aspect of right wrist. She has  a history of type 2 diabetes. She has had swelling and difficulty with ulnar  deviation of the wrist for months. Clinical examination revealed a swollen  right first dorsal compartment.   Clinical examination confirmed stenosing tenosynovitis.   We offered steroid injection, however, due to history of diabetes she  requested that we proceed directly to release of the first dorsal  compartment.   PROCEDURE:  [name] is brought to the operating room and placed in  supine position on the table. Following light sedation with Versed and  fentanyl,  the right arm was prepped with Betadine soap solution, sterilely  draped. Marcaine 25% and 2% lidocaine were infiltrated at the first dorsal  compartment to obtain a field block.   When anesthesia satisfactory, procedure commenced with exsanguination of the  right arm with an  Esmarch bandage and inflation of the arterial tourniquet  to 220 mmHg.   Procedure commenced with a short transverse incision directly over the  palpably thickened first dorsal compartment.   Subcutaneous tissues were carefully divided revealing the swollen first  dorsal compartment. This was split at its apex revealing two slips of the  abductor pollicis longus. This was a separate dorsal compartment from the  extensor pollicis brevis. The accessory dorsal compartment was released  followed by resection of the septum between the abductor pollicis longus and  extensor pollicis brevis tendons.   The wound was then repaired with intradermal 3-0 Prolene suture.   A compressive dressings applied with Ace wrap.   For aftercare, we have advised [name] to begin immediate range of motion  exercises.   She will use Darvocet-N 100 one p.o. q.4-6h. p.r.n. pain, she is given  prescription 20 tablets without refill.   She will return to our office for follow-up in one week for suture removal  and advancement to a therapy program.      RVS/MEDQ  D:  09/10/2004  T:  09/10/2004  Job:  [id number]

## 2010-12-24 NOTE — Op Note (Signed)
[name], EID                 ACCOUNT NO.:  [id number]   MEDICAL RECORD NO.:  [id number]          PATIENT TYPE:  AMB   LOCATION:  DSC                          FACILITY:  [facility]   PHYSICIAN:  Mila Homer. Sherlean Foot, M.D. DATE OF BIRTH:  [birth year]   DATE OF PROCEDURE:  01/04/2006  DATE OF DISCHARGE:                                 OPERATIVE REPORT   SURGEON:  Mila Homer. Sherlean Foot, M.D.   ASSISTANTArlys John D. Petrarca, P.A.-C.   ANESTHESIA:  General.   PREOPERATIVE DIAGNOSIS:  Right shoulder rotator cuff tear and impingement  syndrome.   POSTOPERATIVE DIAGNOSIS:  Right shoulder rotator cuff tear and impingement  syndrome.   PROCEDURE:  Right shoulder arthroscopy with glenohumeral debridement,  subacromial decompression, distal clavicle resection, and mini open rotator  cuff repair.   INDICATIONS FOR PROCEDURE:  The patient is a 60 year old with MRI evidence  of rotator cuff tear and signs of chronic impingement syndrome and AC joint  arthritis.  Informed consent was obtained.   DESCRIPTION OF PROCEDURE:  The patient was laid supine, administered general  anesthesia, and then placed in a beach chair position.  The right shoulder  was prepped and draped in the usual sterile fashion.  Anterior and posterior  and direct lateral portals were created with a #11 blade, blunt trocar, and  cannula.  Diagnostic arthroscopy revealed no chondromalacia in the joint,  but a lot of degenerative labral tearing as well as a full-thickness rotator  cuff tear.  These were debrided with the 3.2 Barracuda shaver through the  anterior portal.  I then went into the subacromial space in the posterior  portal.  From the direct lateral portal I performed a bursectomy, CA  ligament release, and with the ArthroCare debridement wand and the Cuda  shaver, I then performed an anterolateral acromioplasty and distal clavicle  resection with __________  bur.  I then identified the rotator cuff easily.  It was  approximately 1 x 1 cm.  I then burred on the bear area there and  then converted to the mini technique.  I removed the portals and extended  the direct lateral portal up to 2 to 2.5 cm in length.  I pierced the  deltoid in line with its raphe where the old portal was and held this in  place with an Arthrex shoulder retractor.  I then placed a single 5.5 mm  bicortical screw anchor and placed a modified Mason-Alan stitch securing the  old rotator cuff back down.  This afforded excellent closure.  I then  irrigated and closed with 0 Vicryl in the deltoid interval, 2-0 in the  subcuticular space, and nylon.  I dressed with Adaptic, 4x4's, ABD's, and 2  inch silk tape, and a sling.  Complications were none and drains were none.           ______________________________  Mila Homer. Sherlean Foot, M.D.     SDL/MEDQ  D:  01/04/2006  T:  01/04/2006  Job:  [id number]

## 2010-12-24 NOTE — Op Note (Signed)
NAMEALVERTA, CACCAMO                           ACCOUNT NO.:  [id number]   MEDICAL RECORD NO.:  [id number]                   PATIENT TYPE:  INP   LOCATION:  3024                                 FACILITY:  [facility]   PHYSICIAN:  Clydene Fake, M.D.               DATE OF BIRTH:  1951/01/27   DATE OF PROCEDURE:  09/01/2003  DATE OF DISCHARGE:                                 OPERATIVE REPORT   PREOPERATIVE DIAGNOSIS:  Stenosis, spondylolisthesis and spondylosis at L4-  5, L5-S1.   POSTOPERATIVE DIAGNOSIS:  Stenosis, spondylolisthesis and spondylosis at L4-  5, L5-S1.   PROCEDURE:  1. __________ laminectomies at L4-5 and L5-S1.  2. Posterior lumbar interbody fusion at L4-5 and L5-S1.  3. Rodding and interbody cages at L4-5 and L5-S1.  4. Segmented pedicle screw fixation at L4 through S1.  5. Posterolateral fusion from L4 to S1 (two levels), Autograft, same     incision, Allograft, Symphony.   SURGEON:  Clydene Fake, M.D.   ASSISTANT:  Danae Orleans. Venetia Maxon, M.D.   ANESTHESIA:  General endotracheal tube anesthesia.   ESTIMATED BLOOD LOSS:  750 mL.   BLOOD REPLACED:  350 mL of Cell Saver blood was given back; no other blood.   DRAINS:  None.   COMPLICATIONS:  None.   REASON FOR PROCEDURE:  The patient is a 60 year old woman who has had a  couple of years of back pain and right leg pain improved with epidural  steroids but always returns.  It was elected to take her for decompression  and fusion of the lumbar spine.   DESCRIPTION OF PROCEDURE:  The patient was brought into the operating room  and general endotracheal anesthesia was induced.  The patient was placed in  the prone position on a Wilson frame and all pressures were padded.  The  patient was prepped and draped in the usual sterile fashion.  The site of  the incision was injected with 20 mL of 1% lidocaine with epinephrine.  The  incision was then made in the midline from the spinous process of L3 through  S1.  The  incision was taken down through the fascia.  Hemostasis was  obtained with Bovie cauterization.  The fascia was incised with the Bovie  and subperiosteal dissection was done to the spinous processes of the lamina  up to the facets and we exposed the transverse processes of L4, L5 and the  lateral sacrum.  Fluoroscopy was used to confirm our positioning and the  decompressive laminectomy was performed at L4-5, L5-S1 along the  facetectomies and resection of pars to decompress the central canal and both  upper and lower nerve roots at each level.  The stenosis was fairly  significant at 5-1 and even more so at 4-5 with a lot of pannus forming  around the pars area at the L4-5 level causing a stenosis. This was all  removed decompressing the canal and nerve roots.  We had __________ to the  disk spaces and got dural hemostasis with bipolar cauterization and then  incised the disk space for a diskectomy bilaterally at 4-5 and 5-1.  We then  used Brantigan interbody  broaches and scrapers to distract the disk space,  scrape out the disk and then remove the disk.  At the 4-5 we distracted  11  mm and it was in good alignment.  While holding the distraction one final  broach for a 9 wide x 11 high spacer was then placed in the interspace with  the interspace prepared and then we switched to distractor and used a final  broach on the opposite side.  We had to scrape the end plates to remove all  disk material. Autograft bone was then removed during the laminectomy and  cleaned of all soft tissue and then chopped into small pieces using the  Symphony system with platelet rich plasma and this bone was packed into two  11 high x 9 wide Brantigan cages and these were tapped into place with one  on each side.  The bone was also placed in the interspace.  This process was  repeated at L5-S1 and 9 x 9 mm Brantigan cages were used bilaterally.  The  bone was packed in the cages and the Autograft Symphony  bone and into the  dead interspace in-between the cages and around them.  Gelfoam and thrombin  was placed in the dura and attention was taken to the pedicle entry points.  Using a high speed drill the transverse process of the lateral facets and  lateral pars were all decorticated and the entry points of the pedicles were  decorticated using fluoroscopy as a guide.  The pedicle probe was placed  down the pedicle, tapped into the pedicle and checked with a small ball  probe making sure there was bony circumference all the way down. We then  placed pedicle screw and this process was done at L3, L4 and L5 bilaterally  placing 45 mm screws x 6 1/4 wide Monarch screws at L4, 60 mm screws x 40 on  the right and 45 on the left and 6 1/4 x 40 mm screws at S1.  The rods were  placed on the screw heads and locking nuts were placed.  We locked down the  top nuts at L4 and then with gentle compression over 4-5, tightened down the  nut at L5 with compression over 5-1 and then tightened the nut over S1.  This was repeated on the opposite side.  The wound was irrigated with  antibiotic solution.  Gelfoam and Thrombin were removed.  We again explored  the nerve roots and the cages making sure everything was in good position  and that we had a good decompression.  Final AP and lateral fluoroscopic  images were obtained showing good alignment of the spine and good position  of all the instrumentation.  Gelfoam was placed over the dura and the  Allograft bone placed in the Symphony system along with the rest of the  Autograft bone.  This was packed into the posterior lateral gutter for a  posterolateral fusion at L4 to S1 for a two level posterolateral fusion. The  retractors were removed.  The paraspinous muscle were closed with 0 Vicryl  interrupted suture.  The fascia was closed with the same.  The subcutaneous tissue was closed with 0, 2-0 and 3-0 Vicryl interrupted suture.  The skin  was closed with  Benzoin and Steri-Strips.  The patient was placed back in  the supine position, awakened from anesthesia and transferred to the  recovery room in stable condition.                                               Clydene Fake, M.D.    JRH/MEDQ  D:  09/01/2003  T:  09/01/2003  Job:  [id number]

## 2010-12-24 NOTE — Discharge Summary (Signed)
NAMESHEKETA, ENDE                           ACCOUNT NO.:  [id number]   MEDICAL RECORD NO.:  [id number]                   PATIENT TYPE:  INP   LOCATION:  3024                                 FACILITY:  [facility]   PHYSICIAN:  Clydene Fake, M.D.               DATE OF BIRTH:  [birth year]   DATE OF ADMISSION:  09/01/2003  DATE OF DISCHARGE:  09/05/2003                                 DISCHARGE SUMMARY   DIAGNOSES:  Stenosis ___________ spondylolisthesis and spondylosis.   DISCHARGE DIAGNOSIS:  Stenosis ____________ spondylolisthesis and  spondylosis.   PROCEDURE:  ____________ of lamina L4-5 and L5-S1 with posterior lumbar  interbody fusion at both of those levels with interbody cages and screw  fixation.   REASON FOR ADMISSION:  The patient is a 60 year old woman who has had on an  off back pain for years which  has been worsening over the last year or so.  The pain went into her right leg.  Steroid injections gave some relief, but  were short-lived.  X-rays showed her to have unstable spondylolisthesis of  L4-5 and retrolisthesis of L5-S1 with stenosis at both levels. The patient  was recommended for for decompression and fusion.   HOSPITAL COURSE:  The patient was admitted on the day of surgery and  underwent procedure above without complications. Postoperatively she was  transferred to the recovery room, then to the floor where she was doing  well. The patient was on Marcaine PCA but wanted that removed that first  night, and she did well with oral pain medications.  The next day, September 02, 2003, she noted that she had much less leg pain. The incision was clean,  dry, and intact with only moderate incisional pain. She ___________ leg  swelling, increasing her activity. PT and OT were consulted to assist with  that. Over the next few days will try to increase her activity, working with  therapy. By September 04, 2003, she did have some flatus, but no bowel  movement.  I wanted to  keep working with her, increase her activity, and get  her moving so she can tolerate p.o. better. She did have nauseousness, but  that has resolved. By September 05, 2003, she was feeling much better. Abdomen  was soft and nontender. She had good bowel sounds and pain was minimal. She  was ambulating well. She was discharged home in stable condition on September 05, 2003.  Discharge medications same as pre-hospitalization.   ACTIVITY:  Up with brace all the time, will need to stay dry for five days.   FOLLOWUP:  Will follow up with me in a few weeks in the office.  Clydene Fake, M.D.    JRH/MEDQ  D:  09/18/2003  T:  09/18/2003  Job:  [id number]

## 2011-03-21 ENCOUNTER — Other Ambulatory Visit: Payer: Self-pay | Admitting: Obstetrics and Gynecology

## 2011-03-21 DIAGNOSIS — Z1231 Encounter for screening mammogram for malignant neoplasm of breast: Secondary | ICD-10-CM

## 2011-04-05 ENCOUNTER — Ambulatory Visit
Admission: RE | Admit: 2011-04-05 | Discharge: 2011-04-05 | Disposition: A | Discharge status: Home / Self Care | Payer: 59 | Source: Ambulatory Visit | Attending: Obstetrics and Gynecology | Admitting: Obstetrics and Gynecology

## 2011-04-05 DIAGNOSIS — Z1231 Encounter for screening mammogram for malignant neoplasm of breast: Secondary | ICD-10-CM

## 2011-04-09 ENCOUNTER — Inpatient Hospital Stay (HOSPITAL_COMMUNITY)
Admission: EM | Admit: 2011-04-09 | Discharge: 2011-04-13 | DRG: 690 | Disposition: A | Discharge status: Home / Self Care | Payer: 59 | Attending: Internal Medicine | Admitting: Internal Medicine

## 2011-04-09 DIAGNOSIS — R109 Unspecified abdominal pain: Secondary | ICD-10-CM | POA: Diagnosis present

## 2011-04-09 DIAGNOSIS — D72829 Elevated white blood cell count, unspecified: Secondary | ICD-10-CM | POA: Diagnosis present

## 2011-04-09 DIAGNOSIS — E039 Hypothyroidism, unspecified: Secondary | ICD-10-CM | POA: Diagnosis present

## 2011-04-09 DIAGNOSIS — M169 Osteoarthritis of hip, unspecified: Secondary | ICD-10-CM | POA: Diagnosis present

## 2011-04-09 DIAGNOSIS — IMO0001 Reserved for inherently not codable concepts without codable children: Secondary | ICD-10-CM | POA: Diagnosis present

## 2011-04-09 DIAGNOSIS — M1610 Unilateral primary osteoarthritis, unspecified hip: Secondary | ICD-10-CM | POA: Diagnosis present

## 2011-04-09 DIAGNOSIS — I10 Essential (primary) hypertension: Secondary | ICD-10-CM | POA: Diagnosis present

## 2011-04-09 DIAGNOSIS — M25559 Pain in unspecified hip: Secondary | ICD-10-CM | POA: Diagnosis present

## 2011-04-09 DIAGNOSIS — E871 Hypo-osmolality and hyponatremia: Secondary | ICD-10-CM | POA: Diagnosis present

## 2011-04-09 DIAGNOSIS — N12 Tubulo-interstitial nephritis, not specified as acute or chronic: Principal | ICD-10-CM | POA: Diagnosis present

## 2011-04-09 DIAGNOSIS — A498 Other bacterial infections of unspecified site: Secondary | ICD-10-CM | POA: Diagnosis present

## 2011-04-09 DIAGNOSIS — Z794 Long term (current) use of insulin: Secondary | ICD-10-CM

## 2011-04-09 LAB — DIFFERENTIAL
Basophils Relative: 0 % (ref 0–1)
Eosinophils Absolute: 0.1 10*3/uL (ref 0.0–0.7)
Monocytes Absolute: 0.9 10*3/uL (ref 0.1–1.0)
Monocytes Relative: 9 % (ref 3–12)

## 2011-04-09 LAB — POCT I-STAT, CHEM 8
BUN: 18 mg/dL (ref 6–23)
Calcium, Ion: 1.11 mmol/L — ABNORMAL LOW (ref 1.12–1.32)
Chloride: 103 meq/L (ref 96–112)
Creatinine, Ser: 0.9 mg/dL (ref 0.50–1.10)
Glucose, Bld: 369 mg/dL — ABNORMAL HIGH (ref 70–99)
HCT: 41 % (ref 36.0–46.0)
Hemoglobin: 13.9 g/dL (ref 12.0–15.0)
Potassium: 4.2 meq/L (ref 3.5–5.1)
Sodium: 133 meq/L — ABNORMAL LOW (ref 135–145)
TCO2: 20 mmol/L (ref 0–100)

## 2011-04-09 LAB — URINALYSIS, ROUTINE W REFLEX MICROSCOPIC
Glucose, UA: >1000 mg/dL — AB
Protein, ur: 30 mg/dL — AB
Specific Gravity, Urine: 1.038 — ABNORMAL HIGH (ref 1.005–1.030)
Urobilinogen, UA: 1 mg/dL (ref 0.0–1.0)

## 2011-04-09 LAB — CBC
MCH: 27 pg (ref 26.0–34.0)
MCHC: 33.8 g/dL (ref 30.0–36.0)
Platelets: 294 10*3/uL (ref 150–400)

## 2011-04-09 LAB — GLUCOSE, CAPILLARY: Glucose-Capillary: 368 mg/dL — ABNORMAL HIGH (ref 70–99)

## 2011-04-09 LAB — URINE MICROSCOPIC-ADD ON

## 2011-04-10 LAB — CBC
HCT: 37.8 % (ref 36.0–46.0)
Hemoglobin: 12.5 g/dL (ref 12.0–15.0)
MCH: 27 pg (ref 26.0–34.0)
MCHC: 33.1 g/dL (ref 30.0–36.0)
MCV: 81.6 fL (ref 78.0–100.0)
Platelets: 243 10*3/uL (ref 150–400)
RBC: 4.63 MIL/uL (ref 3.87–5.11)
RDW: 13.5 % (ref 11.5–15.5)
WBC: 6.5 10*3/uL (ref 4.0–10.5)

## 2011-04-10 LAB — BASIC METABOLIC PANEL WITH GFR
CO2: 23 meq/L (ref 19–32)
Calcium: 8.8 mg/dL (ref 8.4–10.5)
Creatinine, Ser: 0.8 mg/dL (ref 0.50–1.10)
Glucose, Bld: 377 mg/dL — ABNORMAL HIGH (ref 70–99)

## 2011-04-10 LAB — BASIC METABOLIC PANEL
BUN: 15 mg/dL (ref 6–23)
Chloride: 98 mEq/L (ref 96–112)
GFR calc Af Amer: >60 mL/min (ref >60)
GFR calc non Af Amer: >60 mL/min (ref >60)
Potassium: 4.4 mEq/L (ref 3.5–5.1)
Sodium: 133 mEq/L — ABNORMAL LOW (ref 135–145)

## 2011-04-10 LAB — URINALYSIS, MICROSCOPIC ONLY
Bilirubin Urine: NEGATIVE
Glucose, UA: >1000 mg/dL — AB
Hgb urine dipstick: NEGATIVE
Ketones, ur: NEGATIVE mg/dL
Leukocytes, UA: NEGATIVE
Nitrite: NEGATIVE
Protein, ur: NEGATIVE mg/dL
Specific Gravity, Urine: 1.028 (ref 1.005–1.030)
Urobilinogen, UA: 1 mg/dL (ref 0.0–1.0)
pH: 6 (ref 5.0–8.0)

## 2011-04-10 LAB — GLUCOSE, CAPILLARY
Glucose-Capillary: 377 mg/dL — ABNORMAL HIGH (ref 70–99)
Glucose-Capillary: 246 mg/dL — ABNORMAL HIGH (ref 70–99)
Glucose-Capillary: 298 mg/dL — ABNORMAL HIGH (ref 70–99)
Glucose-Capillary: 410 mg/dL — ABNORMAL HIGH (ref 70–99)

## 2011-04-10 NOTE — H&P (Signed)
NAMEGRACLYN, [name]                 ACCOUNT NO.:  [id number]  MEDICAL RECORD NO.:  [id number]  LOCATION:  WLED                         FACILITY:  [facility]  PHYSICIAN:  Gery Pray, MD      DATE OF BIRTH:  1951-02-18  DATE OF ADMISSION:  04/09/2011 DATE OF DISCHARGE:                             HISTORY & PHYSICAL   PRIMARY CARE PHYSICIAN:  [name]. White, M.D.  CODE STATUS:  Full code.  The patient goes to team 5.  CHIEF COMPLAINT:  Flank pain.  HISTORY OF PRESENT ILLNESS:  This is a pleasant 60 year old female who states that she started to develop some back pain from Wednesday.  She initially thought nothing of it; however, today the pain became severe, sharp, constant.  It was right sided.  She reports no fevers, no chills.  No burning on urination.  No blood in her urine.  She states she had some nausea but no vomiting.  She has been quite diaphoretic having to change her clothing the last two nights.  She has no history of  UTI.  She is diabetic.  Her sugars are usually fairly controlled.  She reports some fatigue but no diarrhea.  No altered mental status. History obtained from the patient who appears reliable.  PAST MEDICAL HISTORY:  Includes 1. Hypertension. 2. Diabetes mellitus. 3. Fibromyalgia. 4. Hypothyroidism. 5. Osteoarthritis.  PAST SURGICAL HISTORY: 1. Appendectomy. 2. Cholecystectomy. 3. Multiple orthopedic surgeries including knees, back, rotator cuff,     and breast reduction.  MEDICATIONS:  Citalopram, cyclobenzaprine, Humalog, ketoprofen, Lantus 20 units subcu daily, levothyroxine, Losartan, Lovaza, magnesium citrate, metolazone, metformin 1000 mg p.o. b.i.d., metoprolol 100 mg daily, temazepam, and WelChol.  ALLERGIES:  The patient is allergic to 1. CODEINE. 2. PENICILLIN.  SOCIAL HISTORY:  Negative tobacco, alcohol, or illicit drugs.  She lives with her husband.  She does not use home oxygen.  She does not use a walker.  FAMILY HISTORY:   Significant for diabetes mellitus, thyroid cancer, and hypertension.  PHYSICAL EXAMINATION:  VITAL SIGNS:  Blood pressure 135/88, pulse 89, respirations 18, temperature 97.7, saturation 98% on room air. GENERAL: Alert and oriented female, currently no acute distress. EYES:  Pink conjunctivae.  PERRLA. ENT: Moist oral mucosa.  Trachea midline. NECK:  Supple. LUNGS:  Clear to auscultation bilaterally.  No use of accessory muscles. CARDIOVASCULAR:  Regular rate and rhythm without murmurs, rubs, or gallops.  No JVD. ABDOMEN:  Soft.  Positive bowel sounds.  Nontender, nondistended.  No organomegaly. NEURO:  Cranial nerves II through XII are grossly intact.  Sensation intact. MUSCULOSKELETAL:  Strength 5/5 in all extremities.  No clubbing, cyanosis, or edema.  The patient does have some mild right flank tenderness.  SKIN: No rashes.  No subcutaneous crepitations.  LABORATORY DATA:  white blood count 9.9, hemoglobin 13.2, platelets 249. Sodium 133, potassium 4.2, chloride 103, BUN 18, creatinine 0.9.  UA has too-numerous-to- count white blood cells, red blood cells too numerous to count, nitrites positive, leukocyte esterase small.  ASSESSMENT AND PLAN: 1. Pyelonephritis.  Admit the patient.  Blood cultures and urine     cultures will be ordered.  Antibiotics and pain medications as well  as antiemetics. 2. Diabetes mellitus, currently poor controlled, likely secondary to     infection.  We will resume home medications and sliding scale     insulin. 3. Hypertension.  Resume home medication. 4. Hypothyroidism and dyslipidemia.  The patient does not know her     home medications.  These medications have not been restarted.  She     will take her medications in the morning.          ______________________________ Gery Pray, MD     DC/MEDQ  D:  04/09/2011  T:  04/10/2011  Job:  [id number]  Electronically Signed by Gery Pray MD on 04/10/2011 03:45:27 AM

## 2011-04-11 ENCOUNTER — Inpatient Hospital Stay (HOSPITAL_COMMUNITY): Payer: 59

## 2011-04-11 LAB — GLUCOSE, CAPILLARY
Glucose-Capillary: 347 mg/dL — ABNORMAL HIGH (ref 70–99)
Glucose-Capillary: 67 mg/dL — ABNORMAL LOW (ref 70–99)
Glucose-Capillary: 270 mg/dL — ABNORMAL HIGH (ref 70–99)

## 2011-04-12 LAB — GLUCOSE, CAPILLARY
Glucose-Capillary: 417 mg/dL — ABNORMAL HIGH (ref 70–99)
Glucose-Capillary: 238 mg/dL — ABNORMAL HIGH (ref 70–99)
Glucose-Capillary: 54 mg/dL — ABNORMAL LOW (ref 70–99)

## 2011-04-12 LAB — URINE CULTURE
Colony Count: >=100000
Culture  Setup Time: 201209020230

## 2011-04-12 LAB — CBC
HCT: 37.2 % (ref 36.0–46.0)
Hemoglobin: 12.3 g/dL (ref 12.0–15.0)
MCV: 81.6 fL (ref 78.0–100.0)
WBC: 6.6 10*3/uL (ref 4.0–10.5)

## 2011-04-12 LAB — BASIC METABOLIC PANEL
CO2: 25 mEq/L (ref 19–32)
Chloride: 96 mEq/L (ref 96–112)
Glucose, Bld: 361 mg/dL — ABNORMAL HIGH (ref 70–99)
Potassium: 4.3 mEq/L (ref 3.5–5.1)
Sodium: 130 mEq/L — ABNORMAL LOW (ref 135–145)

## 2011-04-12 LAB — HEMOGLOBIN A1C
Hgb A1c MFr Bld: 11.3 % — ABNORMAL HIGH (ref <5.7)
Mean Plasma Glucose: 278 mg/dL — ABNORMAL HIGH (ref <117)

## 2011-04-13 LAB — GLUCOSE, CAPILLARY
Glucose-Capillary: 318 mg/dL — ABNORMAL HIGH (ref 70–99)
Glucose-Capillary: 216 mg/dL — ABNORMAL HIGH (ref 70–99)

## 2011-04-13 LAB — BASIC METABOLIC PANEL
Chloride: 101 mEq/L (ref 96–112)
GFR calc Af Amer: >60 mL/min (ref >60)
GFR calc non Af Amer: >60 mL/min (ref >60)
Potassium: 4.7 mEq/L (ref 3.5–5.1)
Sodium: 136 mEq/L (ref 135–145)

## 2011-04-16 LAB — CULTURE, BLOOD (ROUTINE X 2)
Culture  Setup Time: 201209021057
Culture  Setup Time: 201209021057
Culture: NO GROWTH
Culture: NO GROWTH

## 2011-04-28 NOTE — Discharge Summary (Signed)
[name], [name]                 ACCOUNT NO.:  [id number]  MEDICAL RECORD NO.:  [id number]  LOCATION:  1403                         FACILITY:  [facility]  PHYSICIAN:  Dr. Kellie Shropshire.       DATE OF BIRTH:  1951/05/08  DATE OF ADMISSION:  04/09/2011 DATE OF DISCHARGE:  04/13/2011                        DISCHARGE SUMMARY - REFERRING   PRIMARY CARE PROVIDER:  Dr. [name].  ENDOCRINOLOGIST:  Dr. Talmage Coin with [facility].  DISCHARGE DIAGNOSES: 1. Pyelonephritis. 2. Diabetes type 2, uncontrolled. 3. Hyponatremia. 4. Hypertension. 5. Left hip pain. 6. Hypothyroidism. 7. Fibromyalgia. 8. Osteoarthritis.  DISCHARGE MEDICATIONS: 1. Cipro 500 mg p.o. b.i.d., take for 9 more days. 2. Metformin 500 mg 2 tablets p.o. b.i.d. with meals. 3. Humalog 10 units subcutaneously 3 times a day before meals. 4. Lantus 30 units subcutaneously every morning. 5. Aspirin 81 mg p.o. daily. 6. Calcium/vitamin D 1 tablet p.o. daily. 7. Citalopram 20 mg p.o. daily. 8. Cyclobenzaprine 10 mg p.o. daily as needed for pain. 9. Gabapentin 100 mg 1 to 3 capsules p.o. t.i.d. as needed for pain. 10.Ketoprofen 75 mg p.o. t.i.d. as needed for headache. 11.Levothyroxine 125 mcg p.o. daily. 12.Losartan/hydrochlorothiazide 100/12.5 mg p.o. daily. 13.Lovaza 1 g 4 capsules p.o. daily. 14.Metaxalone 800 mg p.o. t.i.d. as needed for pain. 15.Metoprolol XL succinate 100 mg 1 tablet p.o. daily. 16.Multivitamins 1 tablet p.o. daily. 17.Vitamin D3, 2000 units 1 capsule p.o. daily. 18.WelChol 2 tablets p.o. b.i.d. 19.Restoril 15 mg p.o. q.h.s. as needed for insomnia.  DIAGNOSTIC LABORATORY DATA:  WBCs 9.9, hemoglobin 13.2, hematocrit 39.0, and platelets 294.  Sodium 133, potassium 4.2, chloride 103, CO2 of 20, BUN 18, creatinine 0.90, and glucose 369.  Urinalysis yields small leukocytes, positive nitrites, large blood, 30 protein, 15 ketones, greater than 1000 glucose, many bacteria, rbc's too many to  count, and wbc's too many to count.  Hemoglobin A1c is 11.3.  Mean plasma glucose 278.  Blood culture x2 shows no growth to date.  Urine culture with greater than 100,000 colonies of Escherichia coli.  DIAGNOSTIC IMAGING: 1. Two-view of the left hip on September 3rd, yields no acute fracture     or subluxation. 2. Two-view of the left knee on September 3rd, yields unicompartmental     left knee arthroplasty changes without acute or complicated area. 3. CT abdomen and pelvis without contrast on September 3rd, yields     unremarkable noncontrast CT.  Examination of the abdomen/pelvis, a     small left renal calculus is noted, but no obstructing ureteral     calculi.  CONSULTATIONS:  None.  PROCEDURES:  None.  BRIEF HISTORY:  [name] is a very pleasant 60 year old female with a history of diabetes and hypertension, who presented to the [facility] ED with a chief complaint of flank pain.  She indicated that she developed back pain couple of days prior to her presentation, but she thought nothing of it.  However, the pain worsened.  The pain was located on the right side.  She denied fever or chills.  No burning with urination.  No blood in her urine.  She also indicated she has some nausea, but no vomiting.  She indicates she has a history of UTI and is diabetic.  She reports that her sugars are usually fairly well controlled.  Workup in the ED, yielded pyelonephritis and poorly controlled diabetes.  Hospitalists were asked to admit for further evaluation and treatment.  HOSPITAL COURSE BY PROBLEM: 1. Pyelonephritis.  The patient had CT of the abdomen and pelvis as     dictated above.  Urine culture came back positive for Escherichia     coli.  The patient on IV Cipro until September 5th at which time,     it was changed to p.o.  The patient will remain on p.o. Cipro for 9     more days for a total 14-day course. 2. Diabetes type 2, uncontrolled.  Probably related to her  infectious     process as well as some noncompliance.  Hemoglobin A1c is 11.3.     The patient reports that she does take her insulin as ordered, but     only checks her blood sugar a couple times a week.  During her     hospitalization, she had several readings of greater than 400 and     she also had a tendency to dip into the 50s at 2 o'clock in the     afternoon.  She was initially covered with her home Lantus and     sliding scale.  During the course of her hospitalization, she was     started on metformin.  Her Lantus was increased to 30 units as     dictated above and her home coverage was decreased to 10 units as     dictated above.  The patient has an appointment with her     endocrinologist tomorrow, September 6th for further followup. 3. Hyponatremia.  At one point during her hospitalization, her sodium     was 130.  At the time of discharge, her sodium is 136. 4. Hypertension.  The patient's blood pressure remained in fairly good     control.  She was continued on her losartan. 5. Left hip pain most likely related to her osteoarthritis.  The     patient has a history of chronic back pain and passed back     surgeries.  Her pain was managed per her home regimen.  She had x-     rays as stated above that were negative for acute process. 6. Hypothyroidism.  The patient remained on her home Synthroid. 7. Fibromyalgia, stable at baseline.  Continue home medications. 8. Osteoarthritis, stable at baseline.  Continue home medications.  PHYSICAL EXAMINATION:  VITAL SIGNS:  Temperature 98.9, blood pressure 143/84, heart rate 70, respiratory rate is 18, and saturation 98% on room air. GENERAL:  Awake and alert, up in chair, no acute distress. CV:  Regular rate and rhythm.  No murmur, gallop, or rub.  No lower extremity edema. RESPIRATORY:  Normal effort.  Breath sounds clear to auscultation bilaterally.  No rhonchi, wheezes, or rales. ABDOMEN:  Obese and soft.  Positive bowel sounds  throughout, nontender to palpation. NEUROLOGIC:  Alert and oriented x3.  Speech clear.  Facial symmetry.  ACTIVITY:  As tolerated.  DIET:  Carb modified.  FOLLOWUP:  The patient has appointment with Dr. Talmage Coin, her endocrinologist tomorrow, September 6th at 11:40 a.m.  DISPOSITION:  The patient is medically stable and ready for discharge to home.  Time spent, 35 minutes.     Gwenyth Bender, NP   ______________________________ Dr. Kellie Shropshire.  KMB/MEDQ  D:  04/13/2011  T:  04/13/2011  Job:  [id number]  cc:   [name]. Cliffton Asters, M.D. Fax: [phone]  Tonita Cong, M.D. Fax: [phone]  Electronically Signed by Toya Smothers  on 04/28/2011 07:53:51 AM

## 2011-05-20 LAB — DIFFERENTIAL
Eosinophils Absolute: 0
Eosinophils Relative: 0
Lymphs Abs: 1.4
Monocytes Relative: 5

## 2011-05-20 LAB — CBC
HCT: 39
Hemoglobin: 13.1
MCV: 84.9
Platelets: 296
RDW: 13.2

## 2011-05-20 LAB — URINALYSIS, ROUTINE W REFLEX MICROSCOPIC
Glucose, UA: 250 — AB
Hgb urine dipstick: NEGATIVE
Ketones, ur: 15 — AB
Protein, ur: NEGATIVE
pH: 7.5

## 2011-05-20 LAB — COMPREHENSIVE METABOLIC PANEL
ALT: 17
AST: 24
BUN: 9
Chloride: 101
Creatinine, Ser: 0.61
GFR calc non Af Amer: >60
Glucose, Bld: 107 — ABNORMAL HIGH

## 2011-07-28 ENCOUNTER — Other Ambulatory Visit: Payer: Self-pay

## 2011-07-28 ENCOUNTER — Inpatient Hospital Stay (HOSPITAL_COMMUNITY)
Admission: EM | Admit: 2011-07-28 | Discharge: 2011-07-30 | DRG: 638 | Disposition: A | Discharge status: Home / Self Care | Payer: 59 | Attending: Family Medicine | Admitting: Family Medicine

## 2011-07-28 ENCOUNTER — Emergency Department (HOSPITAL_COMMUNITY): Payer: 59

## 2011-07-28 DIAGNOSIS — I10 Essential (primary) hypertension: Secondary | ICD-10-CM | POA: Diagnosis present

## 2011-07-28 DIAGNOSIS — E1110 Type 2 diabetes mellitus with ketoacidosis without coma: Secondary | ICD-10-CM | POA: Diagnosis present

## 2011-07-28 DIAGNOSIS — E1310 Other specified diabetes mellitus with ketoacidosis without coma: Principal | ICD-10-CM | POA: Diagnosis present

## 2011-07-28 DIAGNOSIS — Z794 Long term (current) use of insulin: Secondary | ICD-10-CM

## 2011-07-28 DIAGNOSIS — K760 Fatty (change of) liver, not elsewhere classified: Secondary | ICD-10-CM | POA: Diagnosis present

## 2011-07-28 DIAGNOSIS — E039 Hypothyroidism, unspecified: Secondary | ICD-10-CM | POA: Diagnosis present

## 2011-07-28 DIAGNOSIS — R112 Nausea with vomiting, unspecified: Secondary | ICD-10-CM | POA: Diagnosis present

## 2011-07-28 DIAGNOSIS — K5289 Other specified noninfective gastroenteritis and colitis: Secondary | ICD-10-CM | POA: Diagnosis present

## 2011-07-28 DIAGNOSIS — E119 Type 2 diabetes mellitus without complications: Secondary | ICD-10-CM | POA: Diagnosis present

## 2011-07-28 DIAGNOSIS — R6510 Systemic inflammatory response syndrome (SIRS) of non-infectious origin without acute organ dysfunction: Secondary | ICD-10-CM | POA: Diagnosis present

## 2011-07-28 DIAGNOSIS — K7689 Other specified diseases of liver: Secondary | ICD-10-CM | POA: Diagnosis present

## 2011-07-28 DIAGNOSIS — K529 Noninfective gastroenteritis and colitis, unspecified: Secondary | ICD-10-CM | POA: Diagnosis present

## 2011-07-28 HISTORY — DX: Essential (primary) hypertension: I10

## 2011-07-28 HISTORY — DX: Fibromyalgia: M79.7

## 2011-07-28 HISTORY — DX: Hypothyroidism, unspecified: E03.9

## 2011-07-28 HISTORY — DX: Other specified postprocedural states: Z98.890

## 2011-07-28 HISTORY — DX: Nausea with vomiting, unspecified: R11.2

## 2011-07-28 HISTORY — DX: Other specified postprocedural states: R11.2

## 2011-07-28 LAB — GLUCOSE, CAPILLARY
Glucose-Capillary: 466 mg/dL — ABNORMAL HIGH (ref 70–99)
Glucose-Capillary: 446 mg/dL — ABNORMAL HIGH (ref 70–99)
Glucose-Capillary: 450 mg/dL — ABNORMAL HIGH (ref 70–99)
Glucose-Capillary: 141 mg/dL — ABNORMAL HIGH (ref 70–99)
Glucose-Capillary: 154 mg/dL — ABNORMAL HIGH (ref 70–99)
Glucose-Capillary: 168 mg/dL — ABNORMAL HIGH (ref 70–99)
Glucose-Capillary: 219 mg/dL — ABNORMAL HIGH (ref 70–99)

## 2011-07-28 LAB — COMPREHENSIVE METABOLIC PANEL
Alkaline Phosphatase: 152 U/L — ABNORMAL HIGH (ref 39–117)
BUN: 22 mg/dL (ref 6–23)
Calcium: 10.8 mg/dL — ABNORMAL HIGH (ref 8.4–10.5)
GFR calc Af Amer: 51 mL/min — ABNORMAL LOW (ref >90)
Glucose, Bld: 671 mg/dL (ref 70–99)
Total Protein: 8.8 g/dL — ABNORMAL HIGH (ref 6.0–8.3)

## 2011-07-28 LAB — BLOOD GAS, ARTERIAL
Bicarbonate: 2.9 mEq/L — ABNORMAL LOW (ref 20.0–24.0)
Drawn by: 347861
FIO2: 0.21 %
pCO2 arterial: 13.9 mmHg — CL (ref 35.0–45.0)
pH, Arterial: 6.951 — CL (ref 7.350–7.400)
pO2, Arterial: 129 mmHg — ABNORMAL HIGH (ref 80.0–100.0)

## 2011-07-28 LAB — HEMOGLOBIN A1C
Hgb A1c MFr Bld: 11.2 % — ABNORMAL HIGH (ref <5.7)
Mean Plasma Glucose: 275 mg/dL — ABNORMAL HIGH (ref <117)

## 2011-07-28 LAB — URINALYSIS, ROUTINE W REFLEX MICROSCOPIC
Nitrite: NEGATIVE
Protein, ur: 30 mg/dL — AB
Specific Gravity, Urine: 1.025 (ref 1.005–1.030)
Urobilinogen, UA: 0.2 mg/dL (ref 0.0–1.0)

## 2011-07-28 LAB — BASIC METABOLIC PANEL
CO2: 10 mEq/L — CL (ref 19–32)
Chloride: 110 mEq/L (ref 96–112)
Chloride: 103 mEq/L (ref 96–112)
Creatinine, Ser: 0.84 mg/dL (ref 0.50–1.10)
GFR calc Af Amer: >90 mL/min (ref >90)
GFR calc Af Amer: 85 mL/min — ABNORMAL LOW (ref >90)
GFR calc non Af Amer: 78 mL/min — ABNORMAL LOW (ref >90)
Potassium: 4.3 mEq/L (ref 3.5–5.1)
Potassium: 4.3 mEq/L (ref 3.5–5.1)
Sodium: 137 mEq/L (ref 135–145)
Sodium: 137 mEq/L (ref 135–145)

## 2011-07-28 LAB — POCT I-STAT, CHEM 8
BUN: 23 mg/dL (ref 6–23)
Chloride: 112 mEq/L (ref 96–112)
Creatinine, Ser: 1.3 mg/dL — ABNORMAL HIGH (ref 0.50–1.10)
Sodium: 135 mEq/L (ref 135–145)
TCO2: 8 mmol/L (ref 0–100)

## 2011-07-28 LAB — CARDIAC PANEL(CRET KIN+CKTOT+MB+TROPI): Total CK: 164 U/L (ref 7–177)

## 2011-07-28 LAB — URINE MICROSCOPIC-ADD ON

## 2011-07-28 LAB — DIFFERENTIAL
Basophils Relative: 1 % (ref 0–1)
Eosinophils Absolute: 0 10*3/uL (ref 0.0–0.7)
Monocytes Absolute: 1 10*3/uL (ref 0.1–1.0)
Neutro Abs: 16.2 10*3/uL — ABNORMAL HIGH (ref 1.7–7.7)

## 2011-07-28 LAB — CBC
HCT: 45.6 % (ref 36.0–46.0)
Hemoglobin: 14.9 g/dL (ref 12.0–15.0)
MCH: 27.1 pg (ref 26.0–34.0)
MCHC: 32.7 g/dL (ref 30.0–36.0)

## 2011-07-28 LAB — LIPASE, BLOOD: Lipase: 12 U/L (ref 11–59)

## 2011-07-28 MED ORDER — INSULIN ASPART 100 UNIT/ML ~~LOC~~ SOLN
SUBCUTANEOUS | Status: AC
  Administered 2011-07-28: 10 [IU] via INTRAVENOUS
  Filled 2011-07-28: qty 1

## 2011-07-28 MED ORDER — VANCOMYCIN HCL IN DEXTROSE 1-5 GM/200ML-% IV SOLN
1000.0000 mg | Freq: Once | INTRAVENOUS | Status: AC
  Administered 2011-07-28: 1000 mg via INTRAVENOUS
  Filled 2011-07-28: qty 200

## 2011-07-28 MED ORDER — PANTOPRAZOLE SODIUM 40 MG IV SOLR
40.0000 mg | Freq: Every day | INTRAVENOUS | Status: DC
  Administered 2011-07-29: 40 mg via INTRAVENOUS
  Filled 2011-07-28 (×2): qty 40

## 2011-07-28 MED ORDER — INSULIN REGULAR BOLUS VIA INFUSION
0.0000 [IU] | Freq: Three times a day (TID) | INTRAVENOUS | Status: DC
  Filled 2011-07-28 (×6): qty 10

## 2011-07-28 MED ORDER — MORPHINE SULFATE 2 MG/ML IJ SOLN: 2.0000 mg | INTRAMUSCULAR | Status: DC | PRN

## 2011-07-28 MED ORDER — SODIUM CHLORIDE 0.9 % IV SOLN: INTRAVENOUS | Status: DC

## 2011-07-28 MED ORDER — ONDANSETRON HCL 4 MG/2ML IJ SOLN
4.0000 mg | Freq: Once | INTRAMUSCULAR | Status: AC
  Administered 2011-07-28: 4 mg via INTRAVENOUS
  Filled 2011-07-28: qty 2

## 2011-07-28 MED ORDER — HYDROMORPHONE HCL PF 1 MG/ML IJ SOLN
1.0000 mg | Freq: Once | INTRAMUSCULAR | Status: AC
  Administered 2011-07-28: 1 mg via INTRAVENOUS
  Filled 2011-07-28: qty 1

## 2011-07-28 MED ORDER — METOPROLOL SUCCINATE ER 100 MG PO TB24
100.0000 mg | ORAL_TABLET | Freq: Every day | ORAL | Status: DC
  Administered 2011-07-29 – 2011-07-30 (×2): 100 mg via ORAL
  Filled 2011-07-28 (×3): qty 1

## 2011-07-28 MED ORDER — INSULIN REGULAR HUMAN 100 UNIT/ML IJ SOLN
INTRAMUSCULAR | Status: DC
  Administered 2011-07-28: 5.4 [IU]/h via INTRAVENOUS
  Filled 2011-07-28: qty 1

## 2011-07-28 MED ORDER — INSULIN ASPART 100 UNIT/ML ~~LOC~~ SOLN
0.0000 [IU] | Freq: Every day | SUBCUTANEOUS | Status: DC
  Administered 2011-07-28: 10 [IU] via INTRAVENOUS
  Administered 2011-07-29: 3 [IU] via SUBCUTANEOUS
  Filled 2011-07-28: qty 3

## 2011-07-28 MED ORDER — POTASSIUM CHLORIDE 10 MEQ/100ML IV SOLN
10.0000 meq | INTRAVENOUS | Status: AC
  Administered 2011-07-28 – 2011-07-29 (×3): 10 meq via INTRAVENOUS
  Filled 2011-07-28 (×2): qty 100

## 2011-07-28 MED ORDER — INSULIN REGULAR HUMAN 100 UNIT/ML IJ SOLN: 10.0000 [IU] | Freq: Once | INTRAMUSCULAR | Status: DC

## 2011-07-28 MED ORDER — INSULIN GLARGINE 100 UNIT/ML ~~LOC~~ SOLN: 30.0000 [IU] | Freq: Every day | SUBCUTANEOUS | Status: DC

## 2011-07-28 MED ORDER — COLESEVELAM HCL 625 MG PO TABS
1250.0000 mg | ORAL_TABLET | Freq: Two times a day (BID) | ORAL | Status: DC
  Administered 2011-07-28 – 2011-07-30 (×4): 1250 mg via ORAL
  Filled 2011-07-28 (×5): qty 2

## 2011-07-28 MED ORDER — DEXTROSE-NACL 5-0.45 % IV SOLN
INTRAVENOUS | Status: DC
  Administered 2011-07-28: 11:00:00 via INTRAVENOUS

## 2011-07-28 MED ORDER — OXYCODONE-ACETAMINOPHEN 5-325 MG PO TABS
1.0000 | ORAL_TABLET | ORAL | Status: DC | PRN
  Administered 2011-07-28 (×2): 1 via ORAL
  Administered 2011-07-29 (×2): 2 via ORAL
  Filled 2011-07-28 (×2): qty 2
  Filled 2011-07-28: qty 1
  Filled 2011-07-28: qty 2

## 2011-07-28 MED ORDER — IOHEXOL 300 MG/ML  SOLN
100.0000 mL | Freq: Once | INTRAMUSCULAR | Status: AC | PRN
  Administered 2011-07-28: 100 mL via INTRAVENOUS

## 2011-07-28 MED ORDER — SODIUM CHLORIDE 0.9 % IV SOLN
INTRAVENOUS | Status: DC
  Administered 2011-07-28: 03:00:00 via INTRAVENOUS

## 2011-07-28 MED ORDER — SODIUM CHLORIDE 0.9 % IV BOLUS (SEPSIS)
1000.0000 mL | Freq: Once | INTRAVENOUS | Status: AC
  Administered 2011-07-28: 1000 mL via INTRAVENOUS

## 2011-07-28 MED ORDER — SODIUM BICARBONATE 8.4 % IV SOLN
25.0000 meq | Freq: Once | INTRAVENOUS | Status: AC
  Administered 2011-07-28: 25 meq via INTRAVENOUS
  Filled 2011-07-28: qty 50

## 2011-07-28 MED ORDER — SODIUM CHLORIDE 0.9 % IV BOLUS (SEPSIS)
1000.0000 mL | INTRAVENOUS | Status: AC
  Administered 2011-07-28: 1000 mL via INTRAVENOUS

## 2011-07-28 MED ORDER — INSULIN ASPART 100 UNIT/ML ~~LOC~~ SOLN
4.0000 [IU] | Freq: Three times a day (TID) | SUBCUTANEOUS | Status: DC
  Administered 2011-07-29 – 2011-07-30 (×4): 4 [IU] via SUBCUTANEOUS

## 2011-07-28 MED ORDER — INSULIN GLARGINE 100 UNIT/ML ~~LOC~~ SOLN
30.0000 [IU] | Freq: Every day | SUBCUTANEOUS | Status: DC
  Administered 2011-07-28 – 2011-07-29 (×2): 30 [IU] via SUBCUTANEOUS
  Filled 2011-07-28: qty 3
  Filled 2011-07-28: qty 1

## 2011-07-28 MED ORDER — LEVOFLOXACIN IN D5W 500 MG/100ML IV SOLN
500.0000 mg | INTRAVENOUS | Status: DC
  Administered 2011-07-28: 500 mg via INTRAVENOUS
  Filled 2011-07-28: qty 100

## 2011-07-28 MED ORDER — ADULT MULTIVITAMIN W/MINERALS CH
1.0000 | ORAL_TABLET | Freq: Every day | ORAL | Status: DC
  Administered 2011-07-29 – 2011-07-30 (×2): 1 via ORAL
  Filled 2011-07-28 (×2): qty 1

## 2011-07-28 MED ORDER — DEXTROSE 50 % IV SOLN: 25.0000 mL | INTRAVENOUS | Status: DC | PRN

## 2011-07-28 MED ORDER — VANCOMYCIN HCL 1000 MG IV SOLR
750.0000 mg | Freq: Two times a day (BID) | INTRAVENOUS | Status: DC
  Filled 2011-07-28 (×2): qty 750

## 2011-07-28 MED ORDER — INSULIN ASPART 100 UNIT/ML ~~LOC~~ SOLN
0.0000 [IU] | Freq: Three times a day (TID) | SUBCUTANEOUS | Status: DC
  Administered 2011-07-29: 8 [IU] via SUBCUTANEOUS
  Administered 2011-07-29: 2 [IU] via SUBCUTANEOUS
  Administered 2011-07-30: 5 [IU] via SUBCUTANEOUS

## 2011-07-28 MED ORDER — ENOXAPARIN SODIUM 40 MG/0.4ML ~~LOC~~ SOLN
40.0000 mg | Freq: Every day | SUBCUTANEOUS | Status: DC
  Administered 2011-07-29: 40 mg via SUBCUTANEOUS
  Filled 2011-07-28 (×2): qty 0.4

## 2011-07-28 MED ORDER — LEVOTHYROXINE SODIUM 125 MCG PO TABS
125.0000 ug | ORAL_TABLET | Freq: Every day | ORAL | Status: DC
  Administered 2011-07-29 – 2011-07-30 (×2): 125 ug via ORAL
  Filled 2011-07-28 (×3): qty 1

## 2011-07-28 MED ORDER — CITALOPRAM HYDROBROMIDE 20 MG PO TABS
20.0000 mg | ORAL_TABLET | Freq: Every day | ORAL | Status: DC
  Administered 2011-07-29 – 2011-07-30 (×2): 20 mg via ORAL
  Filled 2011-07-28 (×3): qty 1

## 2011-07-28 NOTE — ED Notes (Signed)
Dr Joneen Roach bedside

## 2011-07-28 NOTE — ED Notes (Signed)
RT @ bedside to obtain ABG.

## 2011-07-28 NOTE — ED Notes (Signed)
CBG registered 371 on ED Glucometer

## 2011-07-28 NOTE — ED Notes (Signed)
Insulin infusion 3.2

## 2011-07-28 NOTE — ED Notes (Signed)
Insulin infusion decreased 6.9

## 2011-07-28 NOTE — ED Notes (Signed)
Xray bedside.

## 2011-07-28 NOTE — ED Notes (Signed)
Pt alert, presents to ED c/o nausea with emesis, onset was last pm, seen pcp today, received script for phenergan suppositories, no relief, came to ER, cbg high

## 2011-07-28 NOTE — ED Notes (Signed)
Insulin infusion 2.2 units/hr

## 2011-07-28 NOTE — ED Provider Notes (Signed)
History     CSN: [id number] Arrival date & time: 07/28/2011  1:26 AM   First MD Initiated Contact with Patient 07/28/11 [phone]      Chief Complaint  Patient presents with  . Emesis    (Consider location/radiation/quality/duration/timing/severity/associated sxs/prior treatment) HPI Comments: 60 year old female with a history of cholecystectomy, appendectomy, diabetes who presents with approximately 24 hours of gradual onset of nausea vomiting and upper abdominal pain, this is gradually getting worse, radiates to the mid back, is severe and is not relieved with Phenergan suppositories or Vicodin. She has been seen by her family Dr. Dr. Tiburcio Pea at [facility]. Patient there was blood work drawn to rule out otitis and according to the patient her blood work was normal. Her pain is severe, the emesis is green  Patient is a 60 y.o. female presenting with vomiting. The history is provided by the patient and the spouse.  Emesis     Past Medical History  Diagnosis Date  . Diabetes mellitus   . Hypertension     History reviewed. No pertinent past surgical history.  History reviewed. No pertinent family history.  History  Substance Use Topics  . Smoking status: Not on file  . Smokeless tobacco: Not on file  . Alcohol Use: No    OB History    Grav Para Term Preterm Abortions TAB SAB Ect Mult Living                  Review of Systems  Gastrointestinal: Positive for vomiting.  All other systems reviewed and are negative.    Allergies  Codeine and Penicillins  Home Medications  No current outpatient prescriptions on file.  BP 93/46  Pulse 132  Temp(Src) 97.7 F (36.5 C) (Oral)  Resp 22  SpO2 100%  Physical Exam  Nursing note and vitals reviewed. Constitutional: She appears well-developed and well-nourished. She appears distressed.  HENT:  Head: Normocephalic and atraumatic.  Mouth/Throat: No oropharyngeal exudate.       Dry MM  Eyes: Conjunctivae and EOM  are normal. Pupils are equal, round, and reactive to light. Right eye exhibits no discharge. Left eye exhibits no discharge. No scleral icterus.  Neck: Normal range of motion. Neck supple. No JVD present. No thyromegaly present.  Cardiovascular: Regular rhythm, normal heart sounds and intact distal pulses.  Exam reveals no gallop and no friction rub.   No murmur heard.      Tachycardia  Pulmonary/Chest: Effort normal and breath sounds normal. No respiratory distress. She has no wheezes. She has no rales.  Abdominal: Soft. Bowel sounds are normal. She exhibits no distension and no mass. There is tenderness (epigastric and supraumbilical tenderness to palpation, no right upper quadrant tenderness, non-peritoneal). There is no rebound and no guarding.  Musculoskeletal: Normal range of motion. She exhibits no edema and no tenderness.  Lymphadenopathy:    She has no cervical adenopathy.  Neurological: She is alert. Coordination normal.  Skin: Skin is warm and dry. No rash noted. No erythema.  Psychiatric: She has a normal mood and affect. Her behavior is normal.    ED Course  Procedures (including critical care time)  Labs Reviewed  GLUCOSE, CAPILLARY - Abnormal; Notable for the following:    Glucose-Capillary 597 (*)    All other components within normal limits  CBC - Abnormal; Notable for the following:    WBC 19.3 (*)    RBC 5.49 (*)    Platelets 459 (*)    All other components within  normal limits  DIFFERENTIAL - Abnormal; Notable for the following:    Neutrophils Relative 84 (*)    Lymphocytes Relative 10 (*)    Neutro Abs 16.2 (*)    Basophils Absolute 0.2 (*)    All other components within normal limits  COMPREHENSIVE METABOLIC PANEL - Abnormal; Notable for the following:    Sodium 133 (*)    Potassium 6.2 (*)    Chloride 93 (*)    CO2 <5 (*)    Glucose, Bld 671 (*)    Creatinine, Ser 1.29 (*)    Calcium 10.8 (*)    Total Protein 8.8 (*)    Alkaline Phosphatase 152 (*)     Total Bilirubin 0.2 (*)    GFR calc non Af Amer 44 (*)    GFR calc Af Amer 51 (*)    All other components within normal limits  URINALYSIS, ROUTINE W REFLEX MICROSCOPIC - Abnormal; Notable for the following:    APPearance CLOUDY (*)    Glucose, UA >1000 (*)    Hgb urine dipstick SMALL (*)    Bilirubin Urine SMALL (*)    Ketones, ur >80 (*)    Protein, ur 30 (*)    All other components within normal limits  LACTIC ACID, PLASMA - Abnormal; Notable for the following:    Lactic Acid, Venous 4.0 (*)    All other components within normal limits  CARDIAC PANEL(CRET KIN+CKTOT+MB+TROPI) - Abnormal; Notable for the following:    CK, MB 4.6 (*)    Relative Index 2.8 (*)    All other components within normal limits  POCT I-STAT, CHEM 8 - Abnormal; Notable for the following:    Potassium 6.3 (*)    Creatinine, Ser 1.30 (*)    Glucose, Bld 686 (*)    Hemoglobin 17.3 (*)    HCT 51.0 (*)    All other components within normal limits  BLOOD GAS, ARTERIAL - Abnormal; Notable for the following:    pH, Arterial 6.951 (*)    pCO2 arterial 13.9 (*)    pO2, Arterial 129.0 (*)    Bicarbonate 2.9 (*)    Acid-base deficit 29.5 (*)    All other components within normal limits  GLUCOSE, CAPILLARY - Abnormal; Notable for the following:    Glucose-Capillary >600 (*)    All other components within normal limits  URINE MICROSCOPIC-ADD ON - Abnormal; Notable for the following:    Bacteria, UA FEW (*)    Casts HYALINE CASTS (*)    All other components within normal limits  LIPASE, BLOOD  POCT CBG MONITORING  I-STAT, CHEM 8  POCT CBG MONITORING   No results found.   1. Diabetic ketoacidosis       MDM  Is ill-appearing with tachycardia, dry mucous membranes, green biliary type emesis,   CBG > 600, AG > 20, CO2 < 10, c/w DKA - K > 6, ECG shows sig tachycardia - fluids aggressive, insuling gtt started, CC provided  CRITICAL CARE Performed by: Vida Roller   Total critical care time:  45  Critical care time was exclusive of separately billable procedures and treating other patients.  Critical care was necessary to treat or prevent imminent or life-threatening deterioration.  Critical care was time spent personally by me on the following activities: development of treatment plan with patient and/or surrogate as well as nursing, discussions with consultants, evaluation of patient's response to treatment, examination of patient, obtaining history from patient or surrogate, ordering and performing treatments and interventions,  ordering and review of laboratory studies, ordering and review of radiographic studies, pulse oximetry and re-evaluation of patient's condition.   ED ECG REPORT   Date: 07/28/2011   Rate: 125  Rhythm: sinus tachycardia  QRS Axis: normal  Intervals: normal  ST/T Wave abnormalities: nonspecific ST/T changes  Conduction Disutrbances:none  Narrative Interpretation:   Old EKG Reviewed: none available  Pt improved slightly with hdyration and insulin - admited to hsopitalist.  ABG shows severe acidosis   Interventions IVF Bicarb Insulin gtt  Vida Roller, MD 07/28/11 [phone]

## 2011-07-28 NOTE — ED Notes (Signed)
Dr Hyacinth Meeker notified of ABG results, orders obtained

## 2011-07-28 NOTE — ED Notes (Signed)
Patient transported to CT 

## 2011-07-28 NOTE — ED Notes (Signed)
Insulin infusion 2.8

## 2011-07-28 NOTE — ED Notes (Signed)
CBG registered 290 on ED Glucometer

## 2011-07-28 NOTE — Progress Notes (Signed)
ANTIBIOTIC CONSULT NOTE - INITIAL  Pharmacy Consult for Vancomycin Indication: rule out sepsis  Allergies  Allergen Reactions  . Codeine   . Penicillins Rash    Patient Measurements: Height: 5\' 4"  (162.6 cm) Weight: 180 lb (81.647 kg) IBW/kg (Calculated) : 54.7    Vital Signs: Temp: 97.7 F (36.5 C) (12/20 0127) Temp src: Oral (12/20 0127) BP: 127/67 mmHg (12/20 0615) Pulse Rate: 132  (12/20 0615) Intake/Output from previous day:   Intake/Output from this shift:    Labs:  Basename 07/28/11 0236 07/28/11 0220  WBC -- 19.3*  HGB 17.3* 14.9  PLT -- 459*  LABCREA -- --  CREATININE 1.30* 1.29*   Estimated Creatinine Clearance: 47 ml/min (by C-G formula based on Cr of 1.3). No results found for this basename: VANCOTROUGH:2,VANCOPEAK:2,VANCORANDOM:2,GENTTROUGH:2,GENTPEAK:2,GENTRANDOM:2,TOBRATROUGH:2,TOBRAPEAK:2,TOBRARND:2,AMIKACINPEAK:2,AMIKACINTROU:2,AMIKACIN:2, in the last 72 hours   Microbiology: No results found for this or any previous visit (from the past 720 hour(s)).  Medical History: Past Medical History  Diagnosis Date  . Diabetes mellitus   . Hypertension   . Hypothyroidism   . Fibromyalgia     Medications:  Scheduled:    .  HYDROmorphone (DILAUDID) injection  1 mg Intravenous Once  . insulin aspart      . insulin regular  10 Units Intravenous Once  . insulin regular  0-10 Units Intravenous TID WC  . ondansetron (ZOFRAN) IV  4 mg Intravenous Once  . sodium bicarbonate  25 mEq Intravenous Once  . sodium chloride  1,000 mL Intravenous Once  . sodium chloride  1,000 mL Intravenous STAT  . vancomycin  1,000 mg Intravenous Once   Infusions:    . sodium chloride 150 mL/hr at 07/28/11 0323  . dextrose 5 % and 0.45% NaCl    . insulin (NOVOLIN-R) infusion 7.7 Units/hr (07/28/11 0557)   Assessment: 60 yo female admitted with abdominal pain, R/O Sepsis.  Goal of Therapy:  Vancomycin trough=15-20 mg/L  Plan:  Vancomycin 750mg  IV q12h.  CrCl~52  N F/U SCr/Levels as needed.  Susanne Greenhouse R 07/28/2011,6:34 AM

## 2011-07-28 NOTE — ED Notes (Signed)
Rx contacted for insulin drip

## 2011-07-28 NOTE — ED Notes (Signed)
Pt family leaves, pt provided ice chips per Adm ALP, cont to monitor, awaiting inpt bed placement

## 2011-07-28 NOTE — ED Notes (Signed)
Rn covering for primary nurse for lunch. Primary nurse came back and was given report about critical value, she reported she will call pt doctor about critical value.

## 2011-07-28 NOTE — ED Notes (Signed)
Pt complains of vomiting and abd pain since yesterday, was seen by her primary this afternoon and all the blood work came back normal, was prescribed phenergan suppositories without relief, pt continues to vomit and is unable to eat or drink

## 2011-07-28 NOTE — ED Notes (Signed)
Insulin infusion 9.3

## 2011-07-28 NOTE — ED Notes (Signed)
RT bedside.

## 2011-07-28 NOTE — Progress Notes (Addendum)
PATIENT DETAILS Name: [name] Age: 60 y.o. Sex: female Date of Birth: 1951/02/16 Admit Date: 07/28/2011 [id number] S, MD  CONSULTS: None  Interval History: [name] is a 60 year old female who presented to the hospital with abdominal pain accompanied by nausea and vomiting. She was found to be in diabetic ketoacidosis and has been maintained on an insulin drip through the night. She is currently meeting parameters for discontinuation of the insulin drip. Her symptoms have improved with no further nausea or vomiting overnight.  ROS: The patient has some nausea but no frank vomiting. Complaining of a headache and some mild left lower quadrant abdominal pain. No diarrhea.   Objective: Vital signs in last 24 hours: Temp:  [97.7 F (36.5 C)-99.1 F (37.3 C)] 99.1 F (37.3 C) (12/20 1519) Pulse Rate:  [103-137] 103  (12/20 1519) Resp:  [17-29] 20  (12/20 1519) BP: (93-163)/(46-122) 145/66 mmHg (12/20 1519) SpO2:  [99 %-100 %] 100 % (12/20 1519) Weight:  [81.647 kg (180 lb)] 180 lb (81.647 kg) (12/20 0557) Weight change:     Intake/Output from previous day:  Intake/Output Summary (Last 24 hours) at 07/28/11 1553 Last data filed at 07/28/11 1348  Gross per 24 hour  Intake   2000 ml  Output   1100 ml  Net    900 ml     Physical Exam:  Gen:  NAD, has a ketone odor to breath. Cardiovascular:  RRR, No M/R/G Respiratory: Lungs CTAB Gastrointestinal: Abdomen soft, mildly tender, ND with normal active bowel sounds. Extremities: No C/E/C   Lab Results: Basic Metabolic Panel:  Lab 07/28/11 9147 07/28/11 0236 07/28/11 0220  NA 137 135 133*  K 4.9 6.3* --  CL 110 112 93*  CO2 10* -- <5*  GLUCOSE 163* 686* 671*  BUN 16 23 22   CREATININE 0.85 1.30* 1.29*  CALCIUM 9.1 -- 10.8*  MG -- -- --  PHOS -- -- --   GFR Estimated Creatinine Clearance: 71.9 ml/min (by C-G formula based on Cr of 0.85). Liver Function Tests:  Lab 07/28/11 0220  AST 16  ALT 18    ALKPHOS 152*  BILITOT 0.2*  PROT 8.8*  ALBUMIN 4.7    Lab 07/28/11 0220  LIPASE 12  AMYLASE --    CBC:  Lab 07/28/11 0236 07/28/11 0220  WBC -- 19.3*  NEUTROABS -- 16.2*  HGB 17.3* 14.9  HCT 51.0* 45.6  MCV -- 83.1  PLT -- 459*   Cardiac Enzymes:  Lab 07/28/11 0213  CKTOTAL 164  CKMB 4.6*  CKMBINDEX --  TROPONINI <0.30   CBG:  Lab 07/28/11 1509 07/28/11 1350 07/28/11 1235 07/28/11 1122 07/28/11 0915  GLUCAP 168* 154* 141* 132* 290*    Studies/Results: Dg Chest 1 View  07/28/2011  IMPRESSION: Minimal interstitial prominence without focal consolidation.  Original Report Authenticated By: Waneta Martins, M.D.   Ct Abdomen Pelvis W Contrast  07/28/2011 IMPRESSION: No acute CT abnormality.  Hepatic steatosis.  Original Report Authenticated By: Waneta Martins, M.D.    Medications: Scheduled Meds:    .  HYDROmorphone (DILAUDID) injection  1 mg Intravenous Once  . insulin aspart      . insulin aspart  0-15 Units Subcutaneous TID WC  . insulin aspart  0-5 Units Subcutaneous QHS  . insulin aspart  4 Units Subcutaneous TID WC  . insulin glargine  30 Units Subcutaneous QHS  . insulin regular  10 Units Intravenous Once  . insulin regular  0-10 Units Intravenous TID WC  .  levofloxacin (LEVAQUIN) IV  500 mg Intravenous Q24H  . ondansetron (ZOFRAN) IV  4 mg Intravenous Once  . sodium bicarbonate  25 mEq Intravenous Once  . sodium chloride  1,000 mL Intravenous Once  . sodium chloride  1,000 mL Intravenous STAT  . vancomycin  750 mg Intravenous Q12H  . vancomycin  1,000 mg Intravenous Once   Continuous Infusions:    . sodium chloride 150 mL/hr at 07/28/11 0323  . dextrose 5 % and 0.45% NaCl 75 mL/hr at 07/28/11 1128  . insulin (NOVOLIN-R) infusion 11.7 Units/hr (07/28/11 0705)   PRN Meds:.dextrose, iohexol, oxyCODONE-acetaminophen Antibiotics: Anti-infectives     Start     Dose/Rate Route Frequency Ordered Stop   07/28/11 1800   vancomycin  (VANCOCIN) 750 mg in sodium chloride 0.9 % 150 mL IVPB        750 mg 150 mL/hr over 60 Minutes Intravenous Every 12 hours 07/28/11 0639     07/28/11 1130   levofloxacin (LEVAQUIN) IVPB 500 mg        500 mg 100 mL/hr over 60 Minutes Intravenous Every 24 hours 07/28/11 1118     07/28/11 0615   vancomycin (VANCOCIN) IVPB 1000 mg/200 mL premix        1,000 mg 200 mL/hr over 60 Minutes Intravenous  Once 07/28/11 0557 07/28/11 0737           Assessment/Plan:  Principal Problem:  *Gastroenteritis The patient's GI symptoms are most likely a reflection of her diabetic ketoacidosis and not an infectious process involving the gastrointestinal system. At this point, I will discontinue empiric antibiotics and monitor her for further signs of infection. I suspect her leukocytosis was the margination from acute distress. Active Problems:  DKA (diabetic ketoacidoses)/Diabetes Mellitus The patient was put on an insulin drip per Glucomander protocol. She is now meeting criteria for discontinuation of the drip. She normally takes 30 units of Lantus daily so we will administer this now and in one hour discontinue the insulin drip. We'll place her on moderate scale sliding scale insulin and 4 units of NovoLog for meal coverage. She still is acidotic with a bicarbonate of 10 and therefore we will continue frequent checks of her chemistries until her gap closes. Would continue high volume IV fluids for now as well. Her last hemoglobin A1c was 11.3 back on 04/12/2011. We'll recheck her hemoglobin A1c but I suspect she has poor glycemic control overall. We'll asked the diabetes coordinator to see and evaluate her.  SIRS (systemic inflammatory response syndrome) At this time, I do not suspect that the patient has an infectious source. Her GI symptoms are likely a reflection of her diabetic ketoacidosis. We'll discontinue empiric antibiotics and monitor her CBC.  Hypothyroidism Check the patient's TSH and resume  her usual dose of Synthroid.  Hypertension We'll resume her metoprolol.  Hepatic steatosis Mild LFT elevation consistent with hepatic steatosis. We'll encourage the patient to lose weight.  Time spent: 25 minutes.   LOS: 0 days   Morgan Aldo, MD Pager [phone]  07/28/2011, 3:53 PM

## 2011-07-28 NOTE — ED Notes (Signed)
MD at bedside. 

## 2011-07-28 NOTE — H&P (Addendum)
PCP:   Dr. Cliffton Asters  Chief Complaint:  Nausea vomiting  HPI: This is a 60 year old female with known history of diabetes who states she started feeling ill yesterday. She developed back pain which she described as severe in the thoracic area which radiated through to her chest right-sided in the same area. She went to develop nausea and vomiting. She saw her PCP today, she was diagnosed with gastroenteritis, on the way home she developed even more severe nausea and vomiting and abdominal pain. Abdominal pain suprapubic in location. She states she developed chills but no fevers, no cough, no shortness of breath, no wheezing no myalgia. Her back pain continued. She reports mild burning or urination and polyuria. No active mental status, marked dehydration. She finally came to the ER. History obtained from the patient and her husband was at the bedside. Patient also complains of chills. No hematemesis. No diarrhea. Kussmaul respiration.  Review of Systems: Positives bolded The patient denies anorexia, fever, weight loss,, vision loss, decreased hearing, hoarseness, chest pain, syncope, dyspnea on exertion, peripheral edema, balance deficits, hemoptysis, abdominal pain, melena, hematochezia, severe indigestion/heartburn, hematuria, incontinence, genital sores, muscle weakness, suspicious skin lesions, transient blindness, difficulty walking, depression, unusual weight change, abnormal bleeding, enlarged lymph nodes, angioedema, and breast masses.  Past Medical History: Past Medical History  Diagnosis Date  . Diabetes mellitus   . Hypertension   . Hypothyroidism   . Fibromyalgia    Past Surgical History  Procedure Date  . Cholecystectomy   . Appendectomy   . Orthopedic surgeries     multiple    Medications: Prior to Admission medications   Not on File  Patient on Lantus, pharmacy to update  Allergies:   Allergies  Allergen Reactions  . Codeine   . Penicillins     Social History:  reports that she has never smoked. She does not have any smokeless tobacco history on file. She reports that she does not drink alcohol or use illicit drugs. is at home with husband  Family History: Family History  Problem Relation Age of Onset  . Diabetes type II    . Hypothyroidism      Physical Exam: Filed Vitals:   07/28/11 0414 07/28/11 0450 07/28/11 0453 07/28/11 0500  BP:  134/66  145/67  Pulse: 132  132 132  Temp:      TempSrc:      Resp:      SpO2: 100%  100% 100%    General:  Alert and oriented times three, well developed and nourished, weak dehydrated female, Kussmaul respiration Eyes: PERRLA, pink conjunctiva, no scleral icterus ENT: Dry oral mucosa, neck supple, no thyromegaly Lungs: clear to ascultation, no wheeze, no crackles, no use of accessory muscles Cardiovascular: regular rate and rhythm, no regurgitation, no gallops, no murmurs. No carotid bruits, no JVD Abdomen: soft, positive BS, non-tender, non-distended, no organomegaly, not an acute abdomen GU: not examined Neuro: CN II - XII grossly intact, sensation intact Musculoskeletal: strength 5/5 all extremities, no clubbing, cyanosis or edema, mild reproducible chest wall pain right-sided Skin: no rash, no subcutaneous crepitation, no decubitus Psych: appropriate patient   Labs on Admission:   [facility] 07/28/11 0236 07/28/11 0220  NA 135 133*  K 6.3* 6.2*  CL 112 93*  CO2 -- <5*  GLUCOSE 686* 671*  BUN 23 22  CREATININE 1.30* 1.29*  CALCIUM -- 10.8*  MG -- --  PHOS -- --    Basename 07/28/11 0220  AST 16  ALT 18  ALKPHOS  152*  BILITOT 0.2*  PROT 8.8*  ALBUMIN 4.7    Basename 07/28/11 0220  LIPASE 12  AMYLASE --    Basename 07/28/11 0236 07/28/11 0220  WBC -- 19.3*  NEUTROABS -- 16.2*  HGB 17.3* 14.9  HCT 51.0* 45.6  MCV -- 83.1  PLT -- 459*    Basename 07/28/11 0213  CKTOTAL 164  CKMB 4.6*  CKMBINDEX --  TROPONINI <0.30   No results found for this basename:  TSH,T4TOTAL,FREET3,T3FREE,THYROIDAB in the last 72 hours No results found for this basename: VITAMINB12:2,FOLATE:2,FERRITIN:2,TIBC:2,IRON:2,RETICCTPCT:2 in the last 72 hours  Radiological Exams on Admission: Ct Abdomen Pelvis W Contrast  07/28/2011  *RADIOLOGY REPORT*  Clinical Data: Epigastric pain  CT ABDOMEN AND PELVIS WITH CONTRAST  Technique:  Multidetector CT imaging of the abdomen and pelvis was performed following the standard protocol during bolus administration of intravenous contrast.  Contrast: OMNIPAQUE IOHEXOL 300 MG/ML IV SOLN  Comparison: 04/11/2011  Findings: Several images are degraded by patient motion.  Limited images through the lung bases demonstrate no significant appreciable abnormality. The heart size is within normal limits. No pleural or pericardial effusion.  Low attenuation of the liver without focal lesion.  Status post cholecystectomy.  No biliary ductal dilatation.  Unremarkable spleen, pancreas, adrenal glands.  Symmetric renal enhancement.  No hydronephrosis or hydroureter.  No bowel obstruction. Small duodenal diverticulum.  Colonic diverticulosis without CT evidence for diverticulitis.  Status post appendectomy.  No free intraperitoneal air or fluid.  No lymphadenopathy.  There is scattered atherosclerotic calcification of the aorta and its branches. No aneurysmal dilatation.  Decompressed bladder with a Foley catheter in place.  Fibroid uterus.  No adnexal mass.  Multilevel degenerative changes of the imaged spine. No acute or aggressive appearing osseous lesion.  L4-S1 fusion.  IMPRESSION: No acute CT abnormality.  Hepatic steatosis.  Original Report Authenticated By: Waneta Martins, M.D.    Assessment/Plan Present on Admission:  .DKA (diabetic ketoacidoses) .SIRS (systemic inflammatory response syndrome) .Gastroenteritis Due to infection, admit to step down DKA protocol Likely due to gastroenteritis but with elevated white blood count, evaluate for the  source of infection with blood, urine and chest x-ray. These have been ordered Antibiotics started Hypertension Hypothyroidism Stable Back pain/chest pain Patient complains of severe pain in the thoracic spinal area, it not improving consider imaging of the thoracic spine to rule out infection as a possible source of patient's DKA Doubt cardiac etiology  Full code DVT prophylaxis Team 5/Dr. Darnelle Catalan  Time in 4:40 AM  Time out 5:15 AM  Marlon Vonruden 07/28/2011, 5:12 AM

## 2011-07-29 LAB — BASIC METABOLIC PANEL
CO2: 14 mEq/L — ABNORMAL LOW (ref 19–32)
Calcium: 9 mg/dL (ref 8.4–10.5)
Calcium: 9.3 mg/dL (ref 8.4–10.5)
Calcium: 9.3 mg/dL (ref 8.4–10.5)
Chloride: 103 mEq/L (ref 96–112)
GFR calc Af Amer: >90 mL/min (ref >90)
GFR calc Af Amer: >90 mL/min (ref >90)
GFR calc non Af Amer: 90 mL/min — ABNORMAL LOW (ref >90)
GFR calc non Af Amer: 89 mL/min — ABNORMAL LOW (ref >90)
Glucose, Bld: 141 mg/dL — ABNORMAL HIGH (ref 70–99)
Sodium: 132 mEq/L — ABNORMAL LOW (ref 135–145)
Sodium: 132 mEq/L — ABNORMAL LOW (ref 135–145)
Sodium: 135 mEq/L (ref 135–145)

## 2011-07-29 LAB — COMPREHENSIVE METABOLIC PANEL
Alkaline Phosphatase: 87 U/L (ref 39–117)
BUN: 12 mg/dL (ref 6–23)
Chloride: 102 mEq/L (ref 96–112)
GFR calc Af Amer: 84 mL/min — ABNORMAL LOW (ref >90)
GFR calc non Af Amer: 72 mL/min — ABNORMAL LOW (ref >90)
Glucose, Bld: 285 mg/dL — ABNORMAL HIGH (ref 70–99)
Potassium: 4.2 mEq/L (ref 3.5–5.1)
Total Bilirubin: 0.3 mg/dL (ref 0.3–1.2)

## 2011-07-29 LAB — CBC
HCT: 34.7 % — ABNORMAL LOW (ref 36.0–46.0)
Hemoglobin: 11.5 g/dL — ABNORMAL LOW (ref 12.0–15.0)
MCV: 79.2 fL (ref 78.0–100.0)
WBC: 5.6 10*3/uL (ref 4.0–10.5)

## 2011-07-29 LAB — GLUCOSE, CAPILLARY
Glucose-Capillary: 275 mg/dL — ABNORMAL HIGH (ref 70–99)
Glucose-Capillary: 131 mg/dL — ABNORMAL HIGH (ref 70–99)

## 2011-07-29 MED ORDER — ZOLPIDEM TARTRATE 5 MG PO TABS
5.0000 mg | ORAL_TABLET | Freq: Every evening | ORAL | Status: DC | PRN
  Administered 2011-07-29: 5 mg via ORAL
  Filled 2011-07-29: qty 1

## 2011-07-29 MED ORDER — ZOLPIDEM TARTRATE 5 MG PO TABS
5.0000 mg | ORAL_TABLET | Freq: Once | ORAL | Status: AC
  Administered 2011-07-29: 5 mg via ORAL
  Filled 2011-07-29: qty 1

## 2011-07-29 NOTE — Progress Notes (Signed)
Patient appears to be sleeping, resp. Even and unlabored after having an Ambien 5mg  po for sleep. Will continue to monitor

## 2011-07-29 NOTE — Progress Notes (Signed)
PATIENT DETAILS Name: [name] Age: 60 y.o. Sex: female Date of Birth: 09-15-1950 Admit Date: 07/28/2011 [id number] S, MD  CONSULTS: None  Interval History: [name] is a 60 year old female who presented to the hospital with abdominal pain accompanied by nausea and vomiting. She was found to be in diabetic ketoacidosis and has been maintained on an insulin drip through the night. She met parameters for discontinuation of the insulin drip on 07/28/11. Her symptoms have improved with no further nausea or vomiting for over 24 hours.  ROS: The patient feels better.  No further N/V or abdominal pain.  Weak.  Feels stressed from having to care for her mother.  Objective: Vital signs in last 24 hours: Temp:  [97.6 F (36.4 C)-99.1 F (37.3 C)] 97.9 F (36.6 C) (12/21 1405) Pulse Rate:  [77-103] 77  (12/21 1405) Resp:  [18-20] 18  (12/21 1405) BP: (131-162)/(66-90) 151/81 mmHg (12/21 1405) SpO2:  [97 %-100 %] 98 % (12/21 1405) Weight:  [81.647 kg (180 lb)] 180 lb (81.647 kg) (12/20 1844) Weight change: 0 kg (0 lb) Last BM Date: 07/27/11  Intake/Output from previous day:  Intake/Output Summary (Last 24 hours) at 07/29/11 1457 Last data filed at 07/29/11 0620  Gross per 24 hour  Intake      0 ml  Output   2800 ml  Net  -2800 ml     Physical Exam:  Gen:  NAD, has a ketone odor to breath. Cardiovascular:  RRR, No M/R/G Respiratory: Lungs CTAB Gastrointestinal: Abdomen soft, mildly tender, ND with normal active bowel sounds. Extremities: No C/E/C   Lab Results: Basic Metabolic Panel:  Lab 07/29/11 4401 07/29/11 0805 07/29/11 0347 07/29/11 0040 07/28/11 2005  NA 135 132* 132* 132* 134*  K 3.5 4.2 -- -- --  CL 103 102 102 103 103  CO2 20 17* 15* 14* 14*  GLUCOSE 141* 300* 285* 287* 238*  BUN 10 11 12 14 14   CREATININE 0.76 0.75 0.85 0.84 0.84  CALCIUM 9.3 9.3 9.0 9.0 8.7  MG -- -- -- -- --  PHOS -- -- -- -- --   GFR Estimated Creatinine Clearance:  76.4 ml/min (by C-G formula based on Cr of 0.76). Liver Function Tests:  Lab 07/29/11 0347 07/28/11 0220  AST 12 16  ALT 11 18  ALKPHOS 87 152*  BILITOT 0.3 0.2*  PROT 6.1 8.8*  ALBUMIN 2.9* 4.7    Lab 07/28/11 0220  LIPASE 12  AMYLASE --    CBC:  Lab 07/29/11 0347 07/28/11 0236 07/28/11 0220  WBC 5.6 -- 19.3*  NEUTROABS -- -- 16.2*  HGB 11.5* 17.3* 14.9  HCT 34.7* 51.0* 45.6  MCV 79.2 -- 83.1  PLT 212 -- 459*   Cardiac Enzymes:  Lab 07/28/11 0213  CKTOTAL 164  CKMB 4.6*  CKMBINDEX --  TROPONINI <0.30   CBG:  Lab 07/29/11 1307 07/29/11 0815 07/28/11 2107 07/28/11 1621 07/28/11 1509  GLUCAP 131* 275* 219* 163* 168*    Ref. Range 07/28/2011 16:30  Hemoglobin A1C Latest Range: <5.7 % 11.2 (H)   R  Ref. Range 07/28/2011 16:30  TSH Latest Range: 0.350-4.500 uIU/mL 1.668   Studies/Results: Dg Chest 1 View  07/28/2011  IMPRESSION: Minimal interstitial prominence without focal consolidation.  Original Report Authenticated By: Waneta Martins, M.D.   Ct Abdomen Pelvis W Contrast  07/28/2011 IMPRESSION: No acute CT abnormality.  Hepatic steatosis.  Original Report Authenticated By: Waneta Martins, M.D.    Medications: Scheduled Meds:    .  citalopram  20 mg Oral Daily  . colesevelam  1,250 mg Oral BID  . enoxaparin  40 mg Subcutaneous QHS  . insulin aspart  0-15 Units Subcutaneous TID WC  . insulin aspart  0-5 Units Subcutaneous QHS  . insulin aspart  4 Units Subcutaneous TID WC  . insulin glargine  30 Units Subcutaneous QHS  . insulin regular  10 Units Intravenous Once  . levothyroxine  125 mcg Oral QAC breakfast  . metoprolol  100 mg Oral Daily  . mulitivitamin with minerals  1 tablet Oral Daily  . pantoprazole (PROTONIX) IV  40 mg Intravenous QHS  . potassium chloride  10 mEq Intravenous Q1H  . zolpidem  5 mg Oral Once  . DISCONTD: insulin glargine  30 Units Subcutaneous QAC breakfast  . DISCONTD: insulin regular  0-10 Units Intravenous TID  WC  . DISCONTD: levofloxacin (LEVAQUIN) IV  500 mg Intravenous Q24H  . DISCONTD: vancomycin  750 mg Intravenous Q12H   Continuous Infusions:    . DISCONTD: sodium chloride 150 mL/hr at 07/28/11 0323  . DISCONTD: dextrose 5 % and 0.45% NaCl 75 mL/hr at 07/28/11 1128  . DISCONTD: insulin (NOVOLIN-R) infusion 11.7 Units/hr (07/28/11 0705)  . DISCONTD: insulin (NOVOLIN-R) infusion     PRN Meds:.morphine injection, oxyCODONE-acetaminophen, DISCONTD: dextrose Antibiotics: Anti-infectives     Start     Dose/Rate Route Frequency Ordered Stop   07/28/11 1800   vancomycin (VANCOCIN) 750 mg in sodium chloride 0.9 % 150 mL IVPB  Status:  Discontinued        750 mg 150 mL/hr over 60 Minutes Intravenous Every 12 hours 07/28/11 0639 07/28/11 1555   07/28/11 1130   levofloxacin (LEVAQUIN) IVPB 500 mg  Status:  Discontinued        500 mg 100 mL/hr over 60 Minutes Intravenous Every 24 hours 07/28/11 1118 07/28/11 1555   07/28/11 0615   vancomycin (VANCOCIN) IVPB 1000 mg/200 mL premix        1,000 mg 200 mL/hr over 60 Minutes Intravenous  Once 07/28/11 0557 07/28/11 0737           Assessment/Plan:  Principal Problem:  *Gastroenteritis The patient's GI symptoms are most likely a reflection of her diabetic ketoacidosis and not an infectious process involving the gastrointestinal system. At this point, I will discontinue empiric antibiotics and monitor her for further signs of infection. I suspect her leukocytosis was demargination from acute distress, as her WBC has normalized. Active Problems:  DKA (diabetic ketoacidoses)/Diabetes Mellitus The patient was put on an insulin drip per Glucomander protocol. She met criteria for discontinuation of the drip on 07/28/11.  We placed her on moderate scale sliding scale insulin and 4 units of NovoLog for meal coverage, along with her usual dose of 30 units of Lantus daily. Her acidosis has resolved. Her last hemoglobin A1c was 11.3 back on 04/12/2011 and  a repeat check shows that it is 11.4%. We've asked the diabetes coordinator to see and evaluate her.  SIRS (systemic inflammatory response syndrome) At this time, I do not suspect that the patient has an infectious source. Her GI symptoms are likely a reflection of her diabetic ketoacidosis. We discontinued empiric antibiotics 12/20.    Hypothyroidism Continue usual dose of Synthroid, TSH WNL.  Hypertension We'll resume her metoprolol.  Hepatic steatosis Mild LFT elevation consistent with hepatic steatosis. We'll encourage the patient to lose weight.  Time spent: 25 minutes.   LOS: 1 day   Hillery Aldo, MD Pager [phone]  07/29/2011, 2:57 PM

## 2011-07-30 LAB — BASIC METABOLIC PANEL
CO2: 21 mEq/L (ref 19–32)
Calcium: 9 mg/dL (ref 8.4–10.5)
Creatinine, Ser: 0.72 mg/dL (ref 0.50–1.10)

## 2011-07-30 LAB — CBC
MCH: 26.5 pg (ref 26.0–34.0)
MCV: 78 fL (ref 78.0–100.0)
Platelets: 209 10*3/uL (ref 150–400)
RDW: 13.6 % (ref 11.5–15.5)
WBC: 5.2 10*3/uL (ref 4.0–10.5)

## 2011-07-30 LAB — GLUCOSE, CAPILLARY: Glucose-Capillary: 226 mg/dL — ABNORMAL HIGH (ref 70–99)

## 2011-07-30 MED ORDER — INSULIN LISPRO 100 UNIT/ML ~~LOC~~ SOLN: 10.0000 [IU] | Freq: Every day | SUBCUTANEOUS | Status: DC

## 2011-07-30 MED ORDER — OXYCODONE-ACETAMINOPHEN 5-325 MG PO TABS: 1.0000 | ORAL_TABLET | ORAL | Status: AC | PRN

## 2011-07-30 MED ORDER — INSULIN GLARGINE 100 UNIT/ML ~~LOC~~ SOLN: 30.0000 [IU] | Freq: Every day | SUBCUTANEOUS | Status: DC

## 2011-07-30 MED ORDER — INSULIN ASPART 100 UNIT/ML ~~LOC~~ SOLN: 0.0000 [IU] | Freq: Every day | SUBCUTANEOUS | Status: DC

## 2011-07-30 MED ORDER — INSULIN LISPRO 100 UNIT/ML ~~LOC~~ SOLN: SUBCUTANEOUS | Status: DC

## 2011-07-30 NOTE — Discharge Summary (Signed)
Physician Discharge Summary  Patient ID: [name] MRN: [id number] DOB/AGE: January 19, 1951 60 y.o.  Admit date: 07/28/2011 Discharge date: 07/30/2011  Primary Care Physician:  Morgan Bradford, MD   Discharge Diagnoses:    Present on Admission:  .DKA (diabetic ketoacidoses) .SIRS (systemic inflammatory response syndrome) .Diabetes mellitus .Hypothyroidism .Hypertension .Gastroenteritis .Hepatic steatosis  Discharge Medications:  Current Discharge Medication List    START taking these medications   Details  insulin aspart (NOVOLOG) 100 UNIT/ML injection Inject 0-5 Units into the skin at bedtime. Qty: 1 vial    oxyCODONE-acetaminophen (PERCOCET) 5-325 MG per tablet Take 1-2 tablets by mouth every 4 (four) hours as needed. Qty: 30 tablet, Refills: 0      CONTINUE these medications which have CHANGED   Details  insulin glargine (LANTUS) 100 UNIT/ML injection Inject 30 Units into the skin at bedtime. Qty: 10 mL    insulin lispro (HUMALOG) 100 UNIT/ML injection Take 10 u meal coverage plus SSI: If CBG < 70: Drink juice; CBG 70-120: 0 u; CBG 121-150: 3 u; CBG 151-200: 4 u; CBG 201-250: 7 u; CBG 251-300: 11 u;CBG 301-350: 15 u; CBG 351-400: 20 u; CBG > 400: call MD  Qty: 10 mL      CONTINUE these medications which have NOT CHANGED   Details  Calcium Carbonate-Vitamin D (CALCIUM 600 + D PO) Take 1 tablet by mouth daily.      citalopram (CELEXA) 20 MG tablet Take 20 mg by mouth daily.      colesevelam (WELCHOL) 625 MG tablet Take 1,250 mg by mouth 2 (two) times daily.      ketoprofen (ORUDIS) 75 MG capsule Take 75 mg by mouth 3 (three) times daily as needed. For headache     levothyroxine (SYNTHROID, LEVOTHROID) 125 MCG tablet Take 125 mcg by mouth daily.      metoprolol (TOPROL-XL) 100 MG 24 hr tablet Take 100 mg by mouth daily.      Multiple Vitamin (MULITIVITAMIN WITH MINERALS) TABS Take 1 tablet by mouth daily.      promethazine (PHENERGAN) 25 MG suppository  Place 25 mg rectally every 6 (six) hours as needed. For nausea.       STOP taking these medications     HYDROcodone-acetaminophen (VICODIN) 5-500 MG per tablet          Disposition and Follow-up: The patient is being discharged home. She is instructed to followup with her PCP in one week.  Consults: None  Significant Diagnostic Studies:  Dg Chest 1 View  07/28/2011  IMPRESSION: Minimal interstitial prominence without focal consolidation.    Ct Abdomen Pelvis W Contrast  07/28/2011 IMPRESSION: No acute CT abnormality.  Hepatic steatosis.    Discharge Laboratory Values: Basic Metabolic Panel:  Lab 07/30/11 4098 07/29/11 1222 07/29/11 0805 07/29/11 0347 07/29/11 0040  NA 134* 135 132* 132* 132*  K 3.5 3.5 -- -- --  CL 101 103 102 102 103  CO2 21 20 17* 15* 14*  GLUCOSE 229* 141* 300* 285* 287*  BUN 12 10 11 12 14   CREATININE 0.72 0.76 0.75 0.85 0.84  CALCIUM 9.0 9.3 9.3 9.0 9.0  MG -- -- -- -- --  PHOS -- -- -- -- --   GFR Estimated Creatinine Clearance: 76.4 ml/min (by C-G formula based on Cr of 0.72). Liver Function Tests:  Lab 07/29/11 0347 07/28/11 0220  AST 12 16  ALT 11 18  ALKPHOS 87 152*  BILITOT 0.3 0.2*  PROT 6.1 8.8*  ALBUMIN 2.9* 4.7  Lab 07/28/11 0220  LIPASE 12  AMYLASE --    CBC:  Lab 07/30/11 0415 07/29/11 0347 07/28/11 0236 07/28/11 0220  WBC 5.2 5.6 -- 19.3*  NEUTROABS -- -- -- 16.2*  HGB 11.6* 11.5* 17.3* 14.9  HCT 34.1* 34.7* 51.0* 45.6  MCV 78.0 79.2 -- 83.1  PLT 209 212 -- 459*   Cardiac Enzymes:  Lab 07/28/11 0213  CKTOTAL 164  CKMB 4.6*  CKMBINDEX --  TROPONINI <0.30   CBG:  Lab 07/30/11 0701 07/29/11 2251 07/29/11 1724 07/29/11 1307 07/29/11 0815  GLUCAP 226* 268* 117* 131* 275*   Hgb A1c  Basename 07/28/11 1630  HGBA1C 11.2*   Thyroid function studies  Basename 07/28/11 1630  TSH 1.668  T4TOTAL --  T3FREE --  THYROIDAB --   Microbiology Recent Results (from the past 240 hour(s))  CULTURE, BLOOD  (ROUTINE X 2)     Status: Normal (Preliminary result)   Collection Time   07/28/11  5:30 AM      Component Value Range Status Comment   Specimen Description BLOOD RIGHT HAND   Final    Special Requests BOTTLES DRAWN AEROBIC ONLY   Final    Setup Time [id number]   Final    Culture     Final    Value:        BLOOD CULTURE RECEIVED NO GROWTH TO DATE CULTURE WILL BE HELD FOR 5 DAYS BEFORE ISSUING A FINAL NEGATIVE REPORT   Report Status PENDING   Incomplete   CULTURE, BLOOD (ROUTINE X 2)     Status: Normal (Preliminary result)   Collection Time   07/28/11  5:30 AM      Component Value Range Status Comment   Specimen Description BLOOD LEFT HAND   Final    Special Requests BOTTLES DRAWN AEROBIC AND ANAEROBIC   Final    Setup Time [id number]   Final    Culture     Final    Value:        BLOOD CULTURE RECEIVED NO GROWTH TO DATE CULTURE WILL BE HELD FOR 5 DAYS BEFORE ISSUING A FINAL NEGATIVE REPORT   Report Status PENDING   Incomplete      Brief H and P: For complete details please refer to admission H and P, but in brief, Mrs. [name] is a 60 year old female who presented to the hospital with abdominal pain accompanied by nausea and vomiting. She was found to be in diabetic ketoacidosis and was referred to the hospitalist service for further evaluation and treatment.   Physical Exam at Discharge: BP 132/80  Pulse 72  Temp(Src) 98.3 F (36.8 C) (Oral)  Resp 18  Ht 5\' 4"  (1.626 m)  Wt 81.647 kg (180 lb)  BMI 30.90 kg/m2  SpO2 96% Gen:  NAD Cardiovascular:  RRR, No M/R/G Respiratory: Lungs CTAB Gastrointestinal: Abdomen soft, NT/ND with normal active bowel sounds. Extremities: No C/E/C   Hospital Course:  Principal Problem:  *DKA (diabetic ketoacidoses)/Uncontrolled Diabetes mellitus The patient was admitted and put on an insulin drip per the Glucomander protocol. Criteria for discontinuation of the drip on 07/28/2011 and subsequently was put on a combination of  basal/bolus insulin along with 4 units of NovoLog for meal coverage. She was continued on her usual dose of 30 units of Lantus daily. Her acidosis resolved with rehydration. Her last hemoglobin A1c was 11.3 back on 04/12/2011 and a repeat check of this lab shows that it is still high at 11.4%. Patient declines outpatient diabetes education  and insists that she has been taking her insulin correctly although she admits that she may have missed a few doses. Just did her insulin at discharge to include 10 units of meal coverage, the insulin resistance sliding scale, and ongoing therapy with 30 units of Lantus daily. We did recommend that she followup closely with her PCP.The patient's GI symptoms were most likely a reflection of her diabetic ketoacidosis and not an infectious process involving the gastrointestinal system. She had negative imaging as noted above. Although initially treated with empiric antibiotics, these were rapidly discontinued.  I suspect her leukocytosis was demargination from acute distress, as her WBC has normalized. Active Problems:  Hypothyroidism Continue usual dose of Synthroid, TSH WNL.  Hypertension The patient's metoprolol was initially held but resumed on 07/29/2011. Her blood pressure is currently well-controlled.  Hepatic steatosis The patient's miild LFT elevation is consistent with hepatic steatosis. We have encouraged the patient to lose weight.   Recommendations for hospital follow-up: 1.  Close monitoring of her glycemic control and ongoing efforts at diabetes education.  Diet: Carbohydrate modified  Activity: Increase activity slowly, no restrictions  Condition at Discharge: Improved  Time spent on Discharge: 40 minutes  Signed: Dr. Trula Ore Rama Pager [phone] 07/30/2011, 11:00 AM

## 2011-08-03 LAB — CULTURE, BLOOD (ROUTINE X 2)
Culture  Setup Time: 201212200823
Culture  Setup Time: 201212200823
Culture: NO GROWTH

## 2011-08-10 ENCOUNTER — Other Ambulatory Visit: Payer: Self-pay | Admitting: Family Medicine

## 2011-08-10 ENCOUNTER — Ambulatory Visit (HOSPITAL_COMMUNITY)
Admission: RE | Admit: 2011-08-10 | Discharge: 2011-08-10 | Disposition: A | Discharge status: Home / Self Care | Payer: 59 | Source: Ambulatory Visit | Attending: Family Medicine | Admitting: Family Medicine

## 2011-08-10 DIAGNOSIS — M79609 Pain in unspecified limb: Secondary | ICD-10-CM

## 2011-08-10 DIAGNOSIS — I809 Phlebitis and thrombophlebitis of unspecified site: Secondary | ICD-10-CM

## 2011-08-10 DIAGNOSIS — R609 Edema, unspecified: Secondary | ICD-10-CM

## 2011-08-10 DIAGNOSIS — M7989 Other specified soft tissue disorders: Secondary | ICD-10-CM | POA: Insufficient documentation

## 2011-08-10 NOTE — Progress Notes (Signed)
VASCULAR LAB PRELIMINARY  PRELIMINARY  PRELIMINARY  PRELIMINARY  Right upper extremity venous duplex completed.    Preliminary report:  Superficial thrombosis noted in cephalic vein and antecubital vein in the antecubitus.  All other veins patent.  [name], RVT 08/10/2011, 1:57 PM

## 2011-11-07 ENCOUNTER — Other Ambulatory Visit: Payer: Self-pay | Admitting: Neurosurgery

## 2011-11-11 ENCOUNTER — Encounter (HOSPITAL_COMMUNITY)
Admission: RE | Admit: 2011-11-11 | Discharge: 2011-11-11 | Disposition: A | Discharge status: Home / Self Care | Payer: 59 | Source: Ambulatory Visit | Attending: Neurosurgery | Admitting: Neurosurgery

## 2011-11-11 ENCOUNTER — Other Ambulatory Visit: Payer: Self-pay

## 2011-11-11 ENCOUNTER — Encounter (HOSPITAL_COMMUNITY): Payer: Self-pay

## 2011-11-11 ENCOUNTER — Encounter (HOSPITAL_COMMUNITY)
Admission: RE | Admit: 2011-11-11 | Discharge: 2011-11-11 | Disposition: A | Discharge status: Home / Self Care | Payer: 59 | Source: Ambulatory Visit | Attending: Anesthesiology | Admitting: Anesthesiology

## 2011-11-11 HISTORY — DX: Unspecified osteoarthritis, unspecified site: M19.90

## 2011-11-11 HISTORY — DX: Major depressive disorder, single episode, unspecified: F32.9

## 2011-11-11 HISTORY — DX: Urinary tract infection, site not specified: N39.0

## 2011-11-11 HISTORY — DX: Depression, unspecified: F32.A

## 2011-11-11 HISTORY — DX: Encounter for other specified aftercare: Z51.89

## 2011-11-11 HISTORY — DX: Reserved for inherently not codable concepts without codable children: IMO0001

## 2011-11-11 HISTORY — DX: Gastro-esophageal reflux disease without esophagitis: K21.9

## 2011-11-11 LAB — DIFFERENTIAL
Basophils Absolute: 0 10*3/uL (ref 0.0–0.1)
Basophils Relative: 1 % (ref 0–1)
Eosinophils Absolute: 0.2 10*3/uL (ref 0.0–0.7)
Eosinophils Relative: 2 % (ref 0–5)
Lymphocytes Relative: 42 % (ref 12–46)
Lymphs Abs: 3.5 10*3/uL (ref 0.7–4.0)
Monocytes Absolute: 0.5 10*3/uL (ref 0.1–1.0)
Monocytes Relative: 6 % (ref 3–12)
Neutro Abs: 4.3 10*3/uL (ref 1.7–7.7)
Neutrophils Relative %: 50 % (ref 43–77)

## 2011-11-11 LAB — CBC
HCT: 40.1 % (ref 36.0–46.0)
Hemoglobin: 13.7 g/dL (ref 12.0–15.0)
MCH: 26.6 pg (ref 26.0–34.0)
MCHC: 34.2 g/dL (ref 30.0–36.0)
MCV: 77.9 fL — ABNORMAL LOW (ref 78.0–100.0)

## 2011-11-11 LAB — BASIC METABOLIC PANEL
BUN: 13 mg/dL (ref 6–23)
CO2: 23 mEq/L (ref 19–32)
Chloride: 100 mEq/L (ref 96–112)
GFR calc Af Amer: >90 mL/min (ref >90)
Glucose, Bld: 220 mg/dL — ABNORMAL HIGH (ref 70–99)
Potassium: 3.4 mEq/L — ABNORMAL LOW (ref 3.5–5.1)

## 2011-11-11 LAB — SURGICAL PCR SCREEN: Staphylococcus aureus: NEGATIVE

## 2011-11-11 LAB — TYPE AND SCREEN: ABO/RH(D): O POS

## 2011-11-11 LAB — URINALYSIS, ROUTINE W REFLEX MICROSCOPIC
Bilirubin Urine: NEGATIVE
Hgb urine dipstick: NEGATIVE
Specific Gravity, Urine: 1.011 (ref 1.005–1.030)
Urobilinogen, UA: 0.2 mg/dL (ref 0.0–1.0)

## 2011-11-11 NOTE — Pre-Procedure Instructions (Addendum)
20 AMILA CALLIES  11/11/2011   Your procedure is scheduled on: 11/15/11  Report to [facility] at 720 AM.  Call this number if you have problems the morning of surgery: [phone]   Remember:   Do not eat food:After Midnight.  May have clear liquids: up to 4 Hours before arrival 320 am.  Clear liquids include soda, tea, black coffee, apple or grape juice, broth.  Take these medicines the morning of surgery with A SIP OF WATER: flexeril, oxycodone or hydrocodone, levothyroxine, metoprolol, phenergan, tenazepam,effexor  STOP meloxicam. Multi vit , omega 3 fish oil now   Do not wear jewelry, make-up or nail polish.  Do not wear lotions, powders, or perfumes. You may wear deodorant.  Do not shave 48 hours prior to surgery.  Do not bring valuables to the hospital.  Contacts, dentures or bridgework may not be worn into surgery.  Leave suitcase in the car. After surgery it may be brought to your room.  For patients admitted to the hospital, checkout time is 11:00 AM the day of discharge.   Patients discharged the day of surgery will not be allowed to drive home.  Name and phone number of your driver: [phone]  Special Instructions: CHG Shower Use Special Wash: 1/2 bottle night before surgery and 1/2 bottle morning of surgery.   Please read over the following fact sheets that you were given: Pain Booklet, Coughing and Deep Breathing, Blood Transfusion Information, MRSA Information and Surgical Site Infection Prevention

## 2011-11-14 MED ORDER — VANCOMYCIN HCL IN DEXTROSE 1-5 GM/200ML-% IV SOLN
1000.0000 mg | INTRAVENOUS | Status: AC
  Administered 2011-11-15: 1000 mg via INTRAVENOUS
  Filled 2011-11-14: qty 200

## 2011-11-14 NOTE — Consult Note (Addendum)
Anesthesia:  Patient is a 61 year old female scheduled for a redo decompressive laminectomy, L3-4, PLIF L3-4 on 11/15/11.  History includes HTN, DM2, depression, GERD, non-smoker, post-operative N/V, hypothyroidism, fibromyalgia, arthritis, history of transfusion.  Her BP at PAT was 159/103.  Unfortunately, it was not re-checked prior to her leaving PAT.  Her antihypertensive medications include losartan/HCTZ 100/12.5 mg daily and Toprol XL 100 mg daily.  I spoke with patient today, and she reports that she had not taken her medications yet that morning.  Her PCP is Dr. [name].  She was sent to Cardiologist Dr. Armanda Magic on 01/21/09 for surgical clearance prior to a right TKR, but does not routinely see a Cardiologist.  Her last stress test from [facility] was 06/22/05 and was overall felt low risk. EF 73%.  (Records available in her old chart.)  EKG from 11/11/11 showed ST @ 125 bpm, LAD, possible inferior and anterior infarct (age undetermined).  It was felt unchanged since 07/28/11.  Poor r wave progression and tachycardia is new since 08/27/09.  She denies CP, SOB.  She has to take pain meds for her back pain, but otherwise is able to do moderate housework, go up a flight of stairs, grocery shop.  CXR from 11/11/11 showed minimal bronchitic changes.  Labs reviewed.  K 3.4.  Cr 0.65.  Glucose 220.  H/H 13.7/40.1.    Anticipate that if her BP and HR are reasonable she can proceed.  Her tachycardia and hyperglycemia can be treated as indicated.  I reviewed her EKG, BP with Anesthesiologist Dr. Sampson Goon who agrees with this plan.  He also recommends patient take both her Toprol and Hyzaar on the morning of surgery.  I have called and left her a message instructing her to do so and asked her to call and confirm instructions.

## 2011-11-15 ENCOUNTER — Ambulatory Visit (HOSPITAL_COMMUNITY): Payer: 59 | Admitting: Vascular Surgery

## 2011-11-15 ENCOUNTER — Ambulatory Visit (HOSPITAL_COMMUNITY): Payer: 59

## 2011-11-15 ENCOUNTER — Inpatient Hospital Stay (HOSPITAL_COMMUNITY)
Admission: RE | Admit: 2011-11-15 | Discharge: 2011-11-19 | DRG: 460 | Disposition: A | Discharge status: Home / Self Care | Payer: 59 | Source: Ambulatory Visit | Attending: Neurosurgery | Admitting: Neurosurgery

## 2011-11-15 ENCOUNTER — Encounter (HOSPITAL_COMMUNITY)
Admission: RE | Disposition: A | Discharge status: Home / Self Care | Payer: Self-pay | Source: Ambulatory Visit | Attending: Neurosurgery

## 2011-11-15 ENCOUNTER — Encounter (HOSPITAL_COMMUNITY): Payer: Self-pay | Admitting: Vascular Surgery

## 2011-11-15 ENCOUNTER — Encounter (HOSPITAL_COMMUNITY): Payer: Self-pay | Admitting: *Deleted

## 2011-11-15 DIAGNOSIS — M47817 Spondylosis without myelopathy or radiculopathy, lumbosacral region: Principal | ICD-10-CM | POA: Diagnosis present

## 2011-11-15 DIAGNOSIS — Z01812 Encounter for preprocedural laboratory examination: Secondary | ICD-10-CM

## 2011-11-15 DIAGNOSIS — Z794 Long term (current) use of insulin: Secondary | ICD-10-CM

## 2011-11-15 DIAGNOSIS — Z01810 Encounter for preprocedural cardiovascular examination: Secondary | ICD-10-CM

## 2011-11-15 DIAGNOSIS — Z981 Arthrodesis status: Secondary | ICD-10-CM

## 2011-11-15 DIAGNOSIS — Z79899 Other long term (current) drug therapy: Secondary | ICD-10-CM

## 2011-11-15 DIAGNOSIS — Z01818 Encounter for other preprocedural examination: Secondary | ICD-10-CM

## 2011-11-15 DIAGNOSIS — Z6830 Body mass index (BMI) 30.0-30.9, adult: Secondary | ICD-10-CM

## 2011-11-15 DIAGNOSIS — Z7982 Long term (current) use of aspirin: Secondary | ICD-10-CM

## 2011-11-15 LAB — GLUCOSE, CAPILLARY: Glucose-Capillary: 146 mg/dL — ABNORMAL HIGH (ref 70–99)

## 2011-11-15 SURGERY — POSTERIOR LUMBAR FUSION 1 LEVEL
Anesthesia: General | Site: Back | Wound class: Clean

## 2011-11-15 MED ORDER — LOSARTAN POTASSIUM-HCTZ 100-12.5 MG PO TABS: 1.0000 | ORAL_TABLET | Freq: Every day | ORAL | Status: DC

## 2011-11-15 MED ORDER — METFORMIN HCL 500 MG PO TABS
1000.0000 mg | ORAL_TABLET | Freq: Two times a day (BID) | ORAL | Status: DC
Start: 2011-11-15 — End: 2011-11-19
  Administered 2011-11-15 – 2011-11-19 (×8): 1000 mg via ORAL
  Filled 2011-11-15 (×11): qty 2

## 2011-11-15 MED ORDER — SODIUM CHLORIDE 0.9 % IR SOLN
Status: DC | PRN
  Administered 2011-11-15: 11:00:00

## 2011-11-15 MED ORDER — INSULIN ASPART 100 UNIT/ML ~~LOC~~ SOLN
0.0000 [IU] | SUBCUTANEOUS | Status: DC
  Administered 2011-11-15: 5 [IU] via SUBCUTANEOUS
  Administered 2011-11-15: 8 [IU] via SUBCUTANEOUS
  Administered 2011-11-16 (×3): 2 [IU] via SUBCUTANEOUS
  Administered 2011-11-16: 3 [IU] via SUBCUTANEOUS
  Administered 2011-11-16: 5 [IU] via SUBCUTANEOUS
  Administered 2011-11-16: 3 [IU] via SUBCUTANEOUS
  Administered 2011-11-17: 8 [IU] via SUBCUTANEOUS
  Administered 2011-11-17: 3 [IU] via SUBCUTANEOUS
  Administered 2011-11-18: 5 [IU] via SUBCUTANEOUS
  Administered 2011-11-18: 2 [IU] via SUBCUTANEOUS
  Administered 2011-11-18: 3 [IU] via SUBCUTANEOUS
  Administered 2011-11-18: 5 [IU] via SUBCUTANEOUS
  Administered 2011-11-19: 2 [IU] via SUBCUTANEOUS

## 2011-11-15 MED ORDER — DIPHENHYDRAMINE HCL 50 MG/ML IJ SOLN: 12.5000 mg | Freq: Four times a day (QID) | INTRAMUSCULAR | Status: DC | PRN

## 2011-11-15 MED ORDER — BACITRACIN 50000 UNITS IM SOLR
INTRAMUSCULAR | Status: AC
  Filled 2011-11-15: qty 1

## 2011-11-15 MED ORDER — PHENYLEPHRINE HCL 10 MG/ML IJ SOLN
INTRAMUSCULAR | Status: DC | PRN
  Administered 2011-11-15 (×8): 80 ug via INTRAVENOUS
  Administered 2011-11-15: 40 ug via INTRAVENOUS
  Administered 2011-11-15: 80 ug via INTRAVENOUS

## 2011-11-15 MED ORDER — METHOCARBAMOL 100 MG/ML IJ SOLN
500.0000 mg | Freq: Four times a day (QID) | INTRAVENOUS | Status: DC | PRN
  Filled 2011-11-15: qty 5

## 2011-11-15 MED ORDER — SCOPOLAMINE 1 MG/3DAYS TD PT72
MEDICATED_PATCH | TRANSDERMAL | Status: DC | PRN
  Administered 2011-11-15: 1 via TRANSDERMAL

## 2011-11-15 MED ORDER — PROPOFOL 10 MG/ML IV EMUL
INTRAVENOUS | Status: DC | PRN
  Administered 2011-11-15: 120 mg via INTRAVENOUS

## 2011-11-15 MED ORDER — LEVOTHYROXINE SODIUM 125 MCG PO TABS
125.0000 ug | ORAL_TABLET | Freq: Every day | ORAL | Status: DC
  Administered 2011-11-15 – 2011-11-19 (×5): 125 ug via ORAL
  Filled 2011-11-15 (×7): qty 1

## 2011-11-15 MED ORDER — LIDOCAINE-EPINEPHRINE 1 %-1:100000 IJ SOLN
INTRAMUSCULAR | Status: DC | PRN
  Administered 2011-11-15: 20 mL

## 2011-11-15 MED ORDER — FLEET ENEMA 7-19 GM/118ML RE ENEM: 1.0000 | ENEMA | Freq: Once | RECTAL | Status: AC | PRN

## 2011-11-15 MED ORDER — MELOXICAM 7.5 MG PO TABS
7.5000 mg | ORAL_TABLET | Freq: Two times a day (BID) | ORAL | Status: DC
  Administered 2011-11-15 – 2011-11-16 (×3): 7.5 mg via ORAL
  Filled 2011-11-15 (×6): qty 1

## 2011-11-15 MED ORDER — INSULIN GLARGINE 100 UNIT/ML ~~LOC~~ SOLN
30.0000 [IU] | Freq: Every day | SUBCUTANEOUS | Status: DC
  Administered 2011-11-15 – 2011-11-18 (×4): 30 [IU] via SUBCUTANEOUS

## 2011-11-15 MED ORDER — ALBUMIN HUMAN 5 % IV SOLN: INTRAVENOUS | Status: DC | PRN

## 2011-11-15 MED ORDER — KETOROLAC TROMETHAMINE 30 MG/ML IJ SOLN
30.0000 mg | Freq: Four times a day (QID) | INTRAMUSCULAR | Status: AC
  Administered 2011-11-15 – 2011-11-16 (×4): 30 mg via INTRAVENOUS
  Filled 2011-11-15 (×4): qty 1

## 2011-11-15 MED ORDER — NALOXONE HCL 0.4 MG/ML IJ SOLN: 0.4000 mg | INTRAMUSCULAR | Status: DC | PRN

## 2011-11-15 MED ORDER — SODIUM CHLORIDE 0.9 % IV SOLN
INTRAVENOUS | Status: AC
  Filled 2011-11-15: qty 500

## 2011-11-15 MED ORDER — LOSARTAN POTASSIUM 50 MG PO TABS
100.0000 mg | ORAL_TABLET | Freq: Every day | ORAL | Status: DC
  Administered 2011-11-15 – 2011-11-19 (×5): 100 mg via ORAL
  Filled 2011-11-15 (×5): qty 2

## 2011-11-15 MED ORDER — THROMBIN 20000 UNITS EX KIT
PACK | CUTANEOUS | Status: DC | PRN
  Administered 2011-11-15: 11:00:00 via TOPICAL

## 2011-11-15 MED ORDER — PROMETHAZINE HCL 25 MG RE SUPP: 25.0000 mg | Freq: Four times a day (QID) | RECTAL | Status: DC | PRN

## 2011-11-15 MED ORDER — SODIUM CHLORIDE 0.9 % IJ SOLN: 3.0000 mL | INTRAMUSCULAR | Status: DC | PRN

## 2011-11-15 MED ORDER — SODIUM CHLORIDE 0.9 % IJ SOLN: 9.0000 mL | INTRAMUSCULAR | Status: DC | PRN

## 2011-11-15 MED ORDER — POTASSIUM CHLORIDE IN NACL 20-0.45 MEQ/L-% IV SOLN
INTRAVENOUS | Status: DC
  Administered 2011-11-15: 17:00:00 via INTRAVENOUS
  Filled 2011-11-15 (×11): qty 1000

## 2011-11-15 MED ORDER — ONDANSETRON HCL 4 MG/2ML IJ SOLN: 4.0000 mg | Freq: Once | INTRAMUSCULAR | Status: DC | PRN

## 2011-11-15 MED ORDER — VANCOMYCIN HCL IN DEXTROSE 1-5 GM/200ML-% IV SOLN
1000.0000 mg | Freq: Two times a day (BID) | INTRAVENOUS | Status: AC
  Administered 2011-11-15 – 2011-11-16 (×3): 1000 mg via INTRAVENOUS
  Filled 2011-11-15 (×3): qty 200

## 2011-11-15 MED ORDER — HYDROMORPHONE HCL PF 1 MG/ML IJ SOLN: 0.2500 mg | INTRAMUSCULAR | Status: DC | PRN

## 2011-11-15 MED ORDER — METOPROLOL SUCCINATE ER 100 MG PO TB24
100.0000 mg | ORAL_TABLET | Freq: Every day | ORAL | Status: DC
  Administered 2011-11-15 – 2011-11-19 (×4): 100 mg via ORAL
  Filled 2011-11-15 (×5): qty 1

## 2011-11-15 MED ORDER — COLESEVELAM HCL 625 MG PO TABS
1250.0000 mg | ORAL_TABLET | Freq: Two times a day (BID) | ORAL | Status: DC
  Administered 2011-11-15 – 2011-11-19 (×8): 1250 mg via ORAL
  Filled 2011-11-15 (×11): qty 2

## 2011-11-15 MED ORDER — MIDAZOLAM HCL 5 MG/5ML IJ SOLN
INTRAMUSCULAR | Status: DC | PRN
  Administered 2011-11-15: 2 mg via INTRAVENOUS

## 2011-11-15 MED ORDER — CYCLOBENZAPRINE HCL 10 MG PO TABS
10.0000 mg | ORAL_TABLET | Freq: Three times a day (TID) | ORAL | Status: DC | PRN
  Administered 2011-11-15 – 2011-11-18 (×4): 10 mg via ORAL
  Filled 2011-11-15 (×3): qty 1

## 2011-11-15 MED ORDER — NEOSTIGMINE METHYLSULFATE 1 MG/ML IJ SOLN
INTRAMUSCULAR | Status: DC | PRN
  Administered 2011-11-15: 3 mg via INTRAVENOUS

## 2011-11-15 MED ORDER — HYDROCHLOROTHIAZIDE 12.5 MG PO CAPS
12.5000 mg | ORAL_CAPSULE | Freq: Every day | ORAL | Status: DC
  Administered 2011-11-15 – 2011-11-19 (×4): 12.5 mg via ORAL
  Filled 2011-11-15 (×5): qty 1

## 2011-11-15 MED ORDER — PANTOPRAZOLE SODIUM 40 MG PO TBEC
40.0000 mg | DELAYED_RELEASE_TABLET | Freq: Every day | ORAL | Status: DC
  Administered 2011-11-15 – 2011-11-19 (×5): 40 mg via ORAL
  Filled 2011-11-15 (×5): qty 1

## 2011-11-15 MED ORDER — SODIUM CHLORIDE 0.9 % IV SOLN: 250.0000 mL | INTRAVENOUS | Status: DC

## 2011-11-15 MED ORDER — SODIUM CHLORIDE 0.9 % IJ SOLN
3.0000 mL | Freq: Two times a day (BID) | INTRAMUSCULAR | Status: DC
  Administered 2011-11-16 – 2011-11-18 (×3): 3 mL via INTRAVENOUS

## 2011-11-15 MED ORDER — ALBUMIN HUMAN 5 % IV SOLN
INTRAVENOUS | Status: DC | PRN
  Administered 2011-11-15 (×3): via INTRAVENOUS

## 2011-11-15 MED ORDER — LACTATED RINGERS IV SOLN
INTRAVENOUS | Status: DC | PRN
  Administered 2011-11-15 (×4): via INTRAVENOUS

## 2011-11-15 MED ORDER — VENLAFAXINE HCL ER 37.5 MG PO CP24
37.5000 mg | ORAL_CAPSULE | Freq: Every day | ORAL | Status: DC
  Administered 2011-11-15 – 2011-11-19 (×5): 37.5 mg via ORAL
  Filled 2011-11-15 (×5): qty 1

## 2011-11-15 MED ORDER — ONDANSETRON HCL 4 MG/2ML IJ SOLN: 4.0000 mg | INTRAMUSCULAR | Status: DC | PRN

## 2011-11-15 MED ORDER — TEMAZEPAM 15 MG PO CAPS
30.0000 mg | ORAL_CAPSULE | Freq: Every evening | ORAL | Status: DC | PRN
  Filled 2011-11-15: qty 2

## 2011-11-15 MED ORDER — 0.9 % SODIUM CHLORIDE (POUR BTL) OPTIME
TOPICAL | Status: DC | PRN
  Administered 2011-11-15: 1000 mL

## 2011-11-15 MED ORDER — PROMETHAZINE HCL 25 MG PO TABS
12.5000 mg | ORAL_TABLET | ORAL | Status: DC | PRN
  Administered 2011-11-18: 25 mg via ORAL
  Filled 2011-11-15: qty 1

## 2011-11-15 MED ORDER — MORPHINE SULFATE 2 MG/ML IJ SOLN: 0.0500 mg/kg | INTRAMUSCULAR | Status: DC | PRN

## 2011-11-15 MED ORDER — PROMETHAZINE HCL 25 MG/ML IJ SOLN
12.5000 mg | INTRAMUSCULAR | Status: DC | PRN
  Administered 2011-11-15 – 2011-11-16 (×2): 12.5 mg via INTRAVENOUS
  Filled 2011-11-15: qty 1

## 2011-11-15 MED ORDER — MORPHINE SULFATE (PF) 1 MG/ML IV SOLN
INTRAVENOUS | Status: DC
  Administered 2011-11-15: 14:00:00 via INTRAVENOUS
  Administered 2011-11-16: 3 mg via INTRAVENOUS
  Administered 2011-11-16: 7.09 mg via INTRAVENOUS
  Administered 2011-11-16: 9 mg via INTRAVENOUS
  Administered 2011-11-16: 4.05 mg via INTRAVENOUS

## 2011-11-15 MED ORDER — ACETAMINOPHEN 650 MG RE SUPP: 650.0000 mg | RECTAL | Status: DC | PRN

## 2011-11-15 MED ORDER — OMEGA-3-ACID ETHYL ESTERS 1 G PO CAPS
2.0000 g | ORAL_CAPSULE | Freq: Every day | ORAL | Status: DC
  Administered 2011-11-15 – 2011-11-19 (×5): 2 g via ORAL
  Filled 2011-11-15 (×5): qty 2

## 2011-11-15 MED ORDER — ADULT MULTIVITAMIN W/MINERALS CH
1.0000 | ORAL_TABLET | Freq: Every day | ORAL | Status: DC
  Administered 2011-11-15 – 2011-11-19 (×5): 1 via ORAL
  Filled 2011-11-15 (×5): qty 1

## 2011-11-15 MED ORDER — DOCUSATE SODIUM 100 MG PO CAPS
100.0000 mg | ORAL_CAPSULE | Freq: Two times a day (BID) | ORAL | Status: DC
  Administered 2011-11-15 – 2011-11-19 (×8): 100 mg via ORAL
  Filled 2011-11-15 (×6): qty 1

## 2011-11-15 MED ORDER — MAGNESIUM HYDROXIDE 400 MG/5ML PO SUSP: 30.0000 mL | Freq: Every day | ORAL | Status: DC | PRN

## 2011-11-15 MED ORDER — ACETAMINOPHEN 325 MG PO TABS: 650.0000 mg | ORAL_TABLET | ORAL | Status: DC | PRN

## 2011-11-15 MED ORDER — SODIUM CHLORIDE 0.9 % IV SOLN
INTRAVENOUS | Status: DC | PRN
  Administered 2011-11-15: 13:00:00 via INTRAVENOUS

## 2011-11-15 MED ORDER — METHOCARBAMOL 500 MG PO TABS: 500.0000 mg | ORAL_TABLET | Freq: Four times a day (QID) | ORAL | Status: DC | PRN

## 2011-11-15 MED ORDER — MORPHINE SULFATE (PF) 1 MG/ML IV SOLN
INTRAVENOUS | Status: AC
  Filled 2011-11-15: qty 25

## 2011-11-15 MED ORDER — ONDANSETRON HCL 4 MG/2ML IJ SOLN: 4.0000 mg | Freq: Four times a day (QID) | INTRAMUSCULAR | Status: DC | PRN

## 2011-11-15 MED ORDER — GLYCOPYRROLATE 0.2 MG/ML IJ SOLN
INTRAMUSCULAR | Status: DC | PRN
  Administered 2011-11-15: 0.4 mg via INTRAVENOUS

## 2011-11-15 MED ORDER — BISACODYL 10 MG RE SUPP: 10.0000 mg | Freq: Every day | RECTAL | Status: DC | PRN

## 2011-11-15 MED ORDER — ONDANSETRON HCL 4 MG/2ML IJ SOLN
INTRAMUSCULAR | Status: DC | PRN
  Administered 2011-11-15: 4 mg via INTRAVENOUS

## 2011-11-15 MED ORDER — EPHEDRINE SULFATE 50 MG/ML IJ SOLN
INTRAMUSCULAR | Status: DC | PRN
  Administered 2011-11-15: 15 mg via INTRAVENOUS
  Administered 2011-11-15 (×2): 10 mg via INTRAVENOUS
  Administered 2011-11-15: 5 mg via INTRAVENOUS
  Administered 2011-11-15 (×2): 15 mg via INTRAVENOUS
  Administered 2011-11-15: 5 mg via INTRAVENOUS

## 2011-11-15 MED ORDER — ROCURONIUM BROMIDE 100 MG/10ML IV SOLN
INTRAVENOUS | Status: DC | PRN
  Administered 2011-11-15: 50 mg via INTRAVENOUS

## 2011-11-15 MED ORDER — LIDOCAINE HCL (CARDIAC) 20 MG/ML IV SOLN
INTRAVENOUS | Status: DC | PRN
  Administered 2011-11-15: 100 mg via INTRAVENOUS

## 2011-11-15 MED ORDER — DIPHENHYDRAMINE HCL 12.5 MG/5ML PO ELIX: 12.5000 mg | ORAL_SOLUTION | Freq: Four times a day (QID) | ORAL | Status: DC | PRN

## 2011-11-15 MED ORDER — FENTANYL CITRATE 0.05 MG/ML IJ SOLN
INTRAMUSCULAR | Status: DC | PRN
  Administered 2011-11-15: 100 ug via INTRAVENOUS
  Administered 2011-11-15 (×2): 50 ug via INTRAVENOUS

## 2011-11-15 MED ORDER — VECURONIUM BROMIDE 10 MG IV SOLR
INTRAVENOUS | Status: DC | PRN
  Administered 2011-11-15 (×2): 2 mg via INTRAVENOUS

## 2011-11-15 SURGICAL SUPPLY — 67 items
BAG DECANTER FOR FLEXI CONT (MISCELLANEOUS) ×2 IMPLANT
BENZOIN TINCTURE PRP APPL 2/3 (GAUZE/BANDAGES/DRESSINGS) ×2 IMPLANT
BLADE SURG ROTATE 9660 (MISCELLANEOUS) IMPLANT
BUR PRECISION FLUTE 5.0 (BURR) ×2 IMPLANT
CAGE SABER 9X10X21MM (Cage) ×4 IMPLANT
CANISTER SUCTION 2500CC (MISCELLANEOUS) ×2 IMPLANT
CLOTH BEACON ORANGE TIMEOUT ST (SAFETY) ×2 IMPLANT
CONT SPEC 4OZ CLIKSEAL STRL BL (MISCELLANEOUS) ×4 IMPLANT
CONT SPEC STER OR (MISCELLANEOUS) ×6 IMPLANT
COVER BACK TABLE 24X17X13 BIG (DRAPES) IMPLANT
COVER TABLE BACK 60X90 (DRAPES) ×2 IMPLANT
DECANTER SPIKE VIAL GLASS SM (MISCELLANEOUS) ×2 IMPLANT
DERMABOND ADVANCED (GAUZE/BANDAGES/DRESSINGS) ×1
DERMABOND ADVANCED .7 DNX12 (GAUZE/BANDAGES/DRESSINGS) ×1 IMPLANT
DRAPE C-ARM 42X72 X-RAY (DRAPES) ×4 IMPLANT
DRAPE LAPAROTOMY 100X72X124 (DRAPES) ×2 IMPLANT
DRAPE POUCH INSTRU U-SHP 10X18 (DRAPES) ×2 IMPLANT
DRAPE PROXIMA HALF (DRAPES) IMPLANT
DRESSING TELFA 8X3 (GAUZE/BANDAGES/DRESSINGS) ×2 IMPLANT
DURAPREP 26ML APPLICATOR (WOUND CARE) ×2 IMPLANT
ELECT REM PT RETURN 9FT ADLT (ELECTROSURGICAL) ×2
ELECTRODE REM PT RTRN 9FT ADLT (ELECTROSURGICAL) ×1 IMPLANT
GAUZE SPONGE 4X4 16PLY XRAY LF (GAUZE/BANDAGES/DRESSINGS) IMPLANT
GLOVE BIOGEL PI IND STRL 7.0 (GLOVE) ×1 IMPLANT
GLOVE BIOGEL PI IND STRL 8.5 (GLOVE) ×2 IMPLANT
GLOVE BIOGEL PI INDICATOR 7.0 (GLOVE) ×1
GLOVE BIOGEL PI INDICATOR 8.5 (GLOVE) ×2
GLOVE ECLIPSE 7.5 STRL STRAW (GLOVE) ×4 IMPLANT
GLOVE EXAM NITRILE LRG STRL (GLOVE) IMPLANT
GLOVE EXAM NITRILE MD LF STRL (GLOVE) IMPLANT
GLOVE EXAM NITRILE XL STR (GLOVE) IMPLANT
GLOVE EXAM NITRILE XS STR PU (GLOVE) IMPLANT
GLOVE SURG SS PI 7.0 STRL IVOR (GLOVE) ×4 IMPLANT
GLOVE SURG SS PI 8.0 STRL IVOR (GLOVE) ×6 IMPLANT
GOWN BRE IMP SLV AUR LG STRL (GOWN DISPOSABLE) ×4 IMPLANT
GOWN BRE IMP SLV AUR XL STRL (GOWN DISPOSABLE) ×4 IMPLANT
GOWN STRL REIN 2XL LVL4 (GOWN DISPOSABLE) ×4 IMPLANT
HEALOS II DOUBLE STRIP 10CC (Peek) ×2 IMPLANT
KIT BASIN OR (CUSTOM PROCEDURE TRAY) ×2 IMPLANT
KIT ROOM TURNOVER OR (KITS) ×2 IMPLANT
MILL MEDIUM DISP (BLADE) ×2 IMPLANT
NEEDLE HYPO 22GX1.5 SAFETY (NEEDLE) ×2 IMPLANT
NS IRRIG 1000ML POUR BTL (IV SOLUTION) ×2 IMPLANT
PACK LAMINECTOMY NEURO (CUSTOM PROCEDURE TRAY) ×2 IMPLANT
PAD ARMBOARD 7.5X6 YLW CONV (MISCELLANEOUS) ×6 IMPLANT
PATTIES SURGICAL .75X.75 (GAUZE/BANDAGES/DRESSINGS) ×2 IMPLANT
ROD EXEDIUM PREBENT 5.5 40MM (Rod) ×2 IMPLANT
ROD EXEDIUM PREBENT 5.5X40 (Rod) ×2 IMPLANT
SCREW EXPEDIUM POLYAXIAL 6X45M (Screw) ×2 IMPLANT
SCREW EXPEDIUM POLYAXIAL 6X50M (Screw) ×4 IMPLANT
SCREW POLYAXIAL 7X45MM (Screw) ×2 IMPLANT
SCREW SET SINGLE INNER (Screw) ×8 IMPLANT
SPONGE GAUZE 4X4 12PLY (GAUZE/BANDAGES/DRESSINGS) ×2 IMPLANT
SPONGE LAP 4X18 X RAY DECT (DISPOSABLE) IMPLANT
SPONGE SURGIFOAM ABS GEL 100 (HEMOSTASIS) ×2 IMPLANT
STRIP CLOSURE SKIN 1/2X4 (GAUZE/BANDAGES/DRESSINGS) ×2 IMPLANT
SUT VIC AB 0 CT1 18XCR BRD8 (SUTURE) ×2 IMPLANT
SUT VIC AB 0 CT1 8-18 (SUTURE) ×2
SUT VIC AB 2-0 CP2 18 (SUTURE) ×4 IMPLANT
SUT VIC AB 3-0 SH 8-18 (SUTURE) ×4 IMPLANT
SYR 20ML ECCENTRIC (SYRINGE) ×4 IMPLANT
TAPE CLOTH SURG 4X10 WHT LF (GAUZE/BANDAGES/DRESSINGS) ×2 IMPLANT
TOWEL OR 17X24 6PK STRL BLUE (TOWEL DISPOSABLE) ×2 IMPLANT
TOWEL OR 17X26 10 PK STRL BLUE (TOWEL DISPOSABLE) ×2 IMPLANT
TRAP SPECIMEN MUCOUS 40CC (MISCELLANEOUS) ×2 IMPLANT
TRAY FOLEY CATH 14FRSI W/METER (CATHETERS) ×2 IMPLANT
WATER STERILE IRR 1000ML POUR (IV SOLUTION) ×2 IMPLANT

## 2011-11-15 NOTE — H&P (Signed)
See H& P.

## 2011-11-15 NOTE — Interval H&P Note (Signed)
History and Physical Interval Note:  11/15/2011 9:43 AM  [name]  has presented today for surgery, with the diagnosis of Lumbar stenosis, Lumbar degenerative disc disease, Lumbago  The various methods of treatment have been discussed with the patient and family. After consideration of risks, benefits and other options for treatment, the patient has consented to  Procedure(s) (LRB): POSTERIOR LUMBAR FUSION 1 LEVEL (N/A) as a surgical intervention .  The patients' history has been reviewed, patient examined, no change in status, stable for surgery.  I have reviewed the patients' chart and labs.  Questions were answered to the patient's satisfaction.     Marton Malizia R

## 2011-11-15 NOTE — Anesthesia Preprocedure Evaluation (Addendum)
Anesthesia Evaluation  Patient identified by MRN, date of birth, ID band Patient awake    Reviewed: Allergy & Precautions, H&P , NPO status , Patient's Chart, lab work & pertinent test results, reviewed documented beta blocker date and time   History of Anesthesia Complications (+) PONV  Airway Mallampati: I TM Distance: >3 FB Neck ROM: Full    Dental   Pulmonary          Cardiovascular hypertension, Pt. on medications     Neuro/Psych    GI/Hepatic GERD-  ,  Endo/Other  Diabetes mellitus-, Well Controlled, Type 2, Insulin DependentHypothyroidism   Renal/GU      Musculoskeletal  (+) Fibromyalgia -  Abdominal   Peds  Hematology   Anesthesia Other Findings   Reproductive/Obstetrics                         Anesthesia Physical Anesthesia Plan  ASA: II  Anesthesia Plan: General   Post-op Pain Management:    Induction: Intravenous  Airway Management Planned: Oral ETT  Additional Equipment:   Intra-op Plan:   Post-operative Plan: Extubation in OR  Informed Consent: I have reviewed the patients History and Physical, chart, labs and discussed the procedure including the risks, benefits and alternatives for the proposed anesthesia with the patient or authorized representative who has indicated his/her understanding and acceptance.     Plan Discussed with: CRNA and Surgeon  Anesthesia Plan Comments:         Anesthesia Quick Evaluation

## 2011-11-15 NOTE — Preoperative (Signed)
Beta Blockers   Reason not to administer Beta Blockers:Not Applicable 

## 2011-11-15 NOTE — Op Note (Signed)
11/15/2011  2:08 PM  PATIENT:  [name]  61 y.o. female  PRE-OPERATIVE DIAGNOSIS:  Lumbar stenosis, spondylosis  L3-4 , unstable retrolithesis L3-4, Lumbar degenerative disc disease, prior surgery  POST-OPERATIVE DIAGNOSIS:   Lumbar stenosis, spondylosis  L3-4 , unstable retrolithesis L3-4, Lumbar degenerative disc disease, prior surgery   PROCEDURE:  Procedure(s): Redo decompressive lam decompressing L3 and L4 roots  (2L) , Plif L3-4, sabre interbody cages L3-4, post lateral fusion L3-4 , nonsegmented expedium pedicle screw fixation L3-4, removal L5 and S1 screws, autograft, helos (allograft), bone marrow aspirate  SURGEON:  Surgeon(s): Clydene Fake, MD Karn Cassis, MD-ASSISTANT    ANESTHESIA:   general  EBL:  Total I/O In: 3950 [I.V.:3100; Blood:100; IV Piggyback:750] Out: 675 [Urine:275; Blood:400]  BLOOD ADMINISTERED:none  DRAINS: none   SPECIMEN:  No Specimen  DICTATION: Patient has had prior decompression fusion at L4-5-1 is develop progressive back and leg pain neurogenic claudication and found to have transitional syndrome at L3 -4 level with instability and stenosis. Patient has had epidural injections which have helped but lately they've been lasting loss less than and the patient in a new workup showing progressive the stenosis and patient wished to proceed with surgical intervention.  Patient brought into the operating room anesthesia induced patient placed in a prone position Wilson frame all pressure points padded. Patient prepped draped sterile fashion 7 incision injected with 20 cc 1% lidocaine with epinephrine. Incision was then made centered previous scar the midline lower lumbar spine and extended cephalad incision taken the fascia hemostasis obtained with Bovie cauterization fascia was incised and found to process and her exposure and subperiosteal dissection dissected up to the pedicle screws and then uncovered pedicle screws at L4-L5 and S1.  Suppressed dissection a synovial 2 and 3 spinous process at the facets and the transverse process of L3 was dissected out. Remove the locking nuts the rods and then the L5 and S1 screws bilaterally. We then removed the right L4 screw we tapped it and placed in expedient of in place we'll repeat his process of L4 not on the left side. We did dissect out the trash possible for a prior putting the screw in the and high-speed drill to decorticate transverse process of facets and transverse of L3 bilaterally for later posterior lateral fusion. Decompressive laminectomy then done removing the spinous process lamina 3 and the L4 dissecting into the previous surgical bed to with areas of scars we carefully dissected through the scar and we decompressed the central canal and the extent to the medial facetectomy fairly extensive 2A a good decompression of the nerve roots to do the spinal change at the C3-4 level nature the L3 and L4 nerves were well decompressed. Attention then taken to the disc space and 3 for this patient's size (interbody fusion bleed in the distal internal surgical rongeurs using a prescription punches and distracted the interspace up to 10 mm. In interspace] cages all the bone that was removed during the laminectomy. The small pieces and packed into to a saber interbody cages and a 10 mm on the top 3 for space on each side. Further imaging of the brain showed good position the L4 screws and the and interbody cages with him the 100.4 L3 using a high-speed drill to decorticate the area placed pedicle probe down the pedicle checked with a small bump omega-3 the bony circumference tapped on then placed in expedient screw is repeated on the opposite side. Roger placed into the screw  heads locking nuts placed in the final tightened with some distraction process and rod system. Within the fusion for orders for him we also aspirated bone marrow aspirate from the pedicles we added this to the heel is allograft  substitution and packed this in the postoperative for posterior fusion L3-4. Within the rechecked dura nerve root redo decompression L3 and L4 nerve roots along with the central canal and hemostasis retractors removed fascia closed with Vicryl interrupted sutures subcutaneous tissue closed with 2-0 Vicryl interrupted sutures skin closed with Steri-Strips dressing was placed is present in the spinal position woken from anesthesia transferred recovery room  PLAN OF CARE: Admit to inpatient   PATIENT DISPOSITION:  PACU - hemodynamically stable.

## 2011-11-15 NOTE — Transfer of Care (Signed)
Immediate Anesthesia Transfer of Care Note  Patient: [name]  Procedure(s) Performed: Procedure(s) (LRB): POSTERIOR LUMBAR FUSION 1 LEVEL (N/A)  Patient Location: PACU  Anesthesia Type: General  Level of Consciousness: awake, alert , oriented and patient cooperative  Airway & Oxygen Therapy: Patient Spontanous Breathing and Patient connected to nasal cannula oxygen  Post-op Assessment: Report given to PACU RN, Post -op Vital signs reviewed and stable and Patient moving all extremities  Post vital signs: Reviewed and stable  Complications: No apparent anesthesia complications

## 2011-11-15 NOTE — Progress Notes (Signed)
ANTIBIOTIC CONSULT NOTE - INITIAL  Pharmacy Consult for Vancomycin  Indication: post-op prophylaxis - for 48 hrs  Allergies  Allergen Reactions  . Codeine Nausea And Vomiting  . Penicillins Rash    Patient Measurements: Height: 5\' 5"  (165.1 cm) Weight: 193 lb 5.5 oz (87.7 kg) IBW/kg (Calculated) : 57  Dosing Weight: 87.7 kg  Vital Signs: Temp: 97.7 F (36.5 C) (04/09 1541) Temp src: Oral (04/09 1541) BP: 144/80 mmHg (04/09 1541) Pulse Rate: 93  (04/09 1541)  Pre-op labs from 11/11/11:    Scr = 0.65.  Estimated creatinine clearance ~80 ml/min    WBC 8.5.     Microbiology: Recent Results (from the past 720 hour(s))  SURGICAL PCR SCREEN     Status: Normal   Collection Time   11/11/11  1:35 PM      Component Value Range Status Comment   MRSA, PCR NEGATIVE  NEGATIVE  Final    Staphylococcus aureus NEGATIVE  NEGATIVE  Final     Medical History: Past Medical History  Diagnosis Date  . Diabetes mellitus   . Hypertension   . Hypothyroidism   . PONV (postoperative nausea and vomiting)   . Blood transfusion   . UTI (lower urinary tract infection)   . GERD (gastroesophageal reflux disease)     occ  . Fibromyalgia   . Arthritis   . Depression    Assessment:  Penicillin allergy.   Vancomycin 1 gram IV given pre-op ~10am.  Vancomycin 1 gram IV q12h x 3 doses requested.  Dose is appropriate for body size and renal function.  Post-op re-do lumbar disc surgery. No drains.  Goal of Therapy:  Vancomycin trough level 10-15 mcg/ml  Plan:    Vancomycin 1 gram IV q12h x 3 doses, to begin tonight at 10pm.  Last dose will be at 10pm on 4/10. Pharmacy to sign off.  Please re-consult if course needs to be extended.  Dennie Fetters, [place] Pager: [phone] 11/15/2011,3:56 PM

## 2011-11-15 NOTE — Anesthesia Procedure Notes (Signed)
Procedure Name: Intubation Date/Time: 11/15/2011 10:18 AM Performed by: Jerilee Hoh Pre-anesthesia Checklist: Patient identified, Emergency Drugs available, Patient being monitored and Suction available Patient Re-evaluated:Patient Re-evaluated prior to inductionOxygen Delivery Method: Circle system utilized Preoxygenation: Pre-oxygenation with 100% oxygen Intubation Type: IV induction Ventilation: Mask ventilation without difficulty Laryngoscope Size: Mac and 3 Grade View: Grade II Tube type: Oral Tube size: 7.5 mm Number of attempts: 1 Airway Equipment and Method: Stylet Placement Confirmation: ETT inserted through vocal cords under direct vision,  positive ETCO2 and breath sounds checked- equal and bilateral Secured at: 22 cm Tube secured with: Tape Dental Injury: Teeth and Oropharynx as per pre-operative assessment

## 2011-11-15 NOTE — Anesthesia Postprocedure Evaluation (Signed)
Anesthesia Post Note  Patient: [name]  Procedure(s) Performed: Procedure(s) (LRB): POSTERIOR LUMBAR FUSION 1 LEVEL (N/A)  Anesthesia type: general  Patient location: PACU  Post pain: Pain level controlled  Post assessment: Patient's Cardiovascular Status Stable  Last Vitals:  Filed Vitals:   11/15/11 1500  BP: 141/66  Pulse: 88  Temp:   Resp: 18    Post vital signs: Reviewed and stable  Level of consciousness: sedated  Complications: No apparent anesthesia complications

## 2011-11-16 LAB — GLUCOSE, CAPILLARY
Glucose-Capillary: 141 mg/dL — ABNORMAL HIGH (ref 70–99)
Glucose-Capillary: 149 mg/dL — ABNORMAL HIGH (ref 70–99)
Glucose-Capillary: 192 mg/dL — ABNORMAL HIGH (ref 70–99)

## 2011-11-16 MED ORDER — MORPHINE SULFATE (PF) 1 MG/ML IV SOLN
INTRAVENOUS | Status: AC
  Filled 2011-11-16: qty 25

## 2011-11-16 NOTE — Progress Notes (Signed)
Doing well. C/o appropriate incisional soreness. No leg/hip pain No Numbness, tingling, weakness No Nausea /vomiting Not oob yet  Temp:  [97.5 F (36.4 C)-98.1 F (36.7 C)] 98.1 F (36.7 C) (04/10 0600) Pulse Rate:  [85-94] 94  (04/10 0600) Resp:  [6-20] 20  (04/10 0600) BP: (94-144)/(60-80) 94/60 mmHg (04/10 0600) SpO2:  [94 %-100 %] 95 % (04/10 0600) Weight:  [87.7 kg (193 lb 5.5 oz)] 87.7 kg (193 lb 5.5 oz) (04/09 1541) Good strength and sensation Incision CDI  Plan: Increase activity

## 2011-11-16 NOTE — Progress Notes (Signed)
Inpatient Diabetes Program Recommendations  AACE/ADA: New Consensus Statement on Inpatient Glycemic Control (2009)  Target Ranges:  Prepandial:   less than 140 mg/dL      Peak postprandial:   less than 180 mg/dL (1-2 hours)      Critically ill patients:  140 - 180 mg/dL   Reason for Visit: Results for PEIGHTON, MEHRA (MRN [id number]) as of 11/16/2011 09:39  Ref. Range 11/15/2011 17:02 11/15/2011 20:24 11/16/2011 00:22 11/16/2011 05:29 11/16/2011 08:01  Glucose-Capillary Latest Range: 70-99 mg/dL 621 (H) 308 (H) 657 (H) 141 (H) 216 (H)    Inpatient Diabetes Program Recommendations HgbA1C: A1C was 11.2% in December, 2012.  Consider rechecking A1C. Diet: Please change diet to CHO modified  Note: Will follow.

## 2011-11-16 NOTE — Evaluation (Signed)
Occupational Therapy Evaluation Patient Details Name: [name] MRN: [id number] DOB: Dec 24, 1950 Today's Date: 11/16/2011  Problem List:  Patient Active Problem List  Diagnoses  . DKA (diabetic ketoacidoses)  . Diabetes mellitus  . Hypothyroidism  . Hypertension  . Hepatic steatosis    Past Medical History:  Past Medical History  Diagnosis Date  . Diabetes mellitus   . Hypertension   . Hypothyroidism   . PONV (postoperative nausea and vomiting)   . Blood transfusion   . UTI (lower urinary tract infection)   . GERD (gastroesophageal reflux disease)     occ  . Fibromyalgia   . Arthritis   . Depression    Past Surgical History:  Past Surgical History  Procedure Date  . Cholecystectomy   . Appendectomy   . Orthopedic surgeries     multiple  . Cesarean section     x 2  . Knee arthroplasty 09    lft partial  . Knee arthroplasty     rt  . Shoulder open rotator cuff repair     rt and lft  . Breast surgery     breast reduction  . Back surgery 96    OT Assessment/Plan/Recommendation OT Assessment Clinical Impression Statement: 61 yo female s/p posterior lumbar fusion that could benefit from skilled OT acutely.  OT Recommendation/Assessment: Patient will need skilled OT in the acute care venue OT Problem List: Impaired balance (sitting and/or standing);Decreased activity tolerance;Decreased strength;Decreased knowledge of use of DME or AE;Decreased knowledge of precautions;Pain OT Therapy Diagnosis : Generalized weakness;Acute pain OT Plan OT Frequency: Min 2X/week OT Treatment/Interventions: Self-care/ADL training;Balance training;Patient/family education OT Recommendation Follow Up Recommendations: No OT follow up Equipment Recommended: None recommended by OT Individuals Consulted Consulted and Agree with Results and Recommendations: Patient OT Goals Acute Rehab OT Goals OT Goal Formulation: With patient Time For Goal Achievement: 7 days ADL Goals Pt  Will Perform Lower Body Bathing: with modified independence;with adaptive equipment;Sit to stand from bed;Sit to stand from chair ADL Goal: Lower Body Bathing - Progress: Goal set today Pt Will Perform Lower Body Dressing: with modified independence;Sit to stand from chair;Sit to stand from bed;with adaptive equipment ADL Goal: Lower Body Dressing - Progress: Goal set today Miscellaneous OT Goals Miscellaneous OT Goal #1: PT will perform bed mobility Mod I hob 20 degrees or less no bed rail as precursor to adls OT Goal: Miscellaneous Goal #1 - Progress: Goal set today Miscellaneous OT Goal #2: Pt will don / doff brace Mod I with family (A) PRN as precursor to ADLS OT Goal: Miscellaneous Goal #2 - Progress: Goal set today  OT Evaluation Precautions/Restrictions  Precautions Precautions: Fall;Back Precaution Booklet Issued: Yes (comment) Precaution Comments: handout provided Required Braces or Orthoses: Yes Spinal Brace: Thoracolumbosacral orthotic Restrictions Weight Bearing Restrictions: No Prior Functioning Home Living Lives With: Spouse Type of Home: House Home Layout: One level Home Access: Stairs to enter Entrance Stairs-Rails: None Entrance Stairs-Number of Steps: 4 Bathroom Shower/Tub: Psychologist, counselling;Door [employer] Toilet: Standard Home Adaptive Equipment: Bedside commode/3-in-1;Walker - rolling;Straight cane Prior Function Level of Independence: Independent with basic ADLs Able to Take Stairs?: Yes Driving: Yes ADL ADL Eating/Feeding: Performed;Modified independent Where Assessed - Eating/Feeding: Chair Grooming: Performed;Wash/dry hands;Wash/dry face;Teeth care;Supervision/safety Where Assessed - Grooming: Standing at sink Toilet Transfer: Performed;Supervision/safety Toilet Transfer Method: Proofreader: Regular height toilet;Grab bars Toileting - Clothing Manipulation: Performed;Supervision/safety Where Assessed - Toileting Clothing  Manipulation: Sit to stand from 3-in-1 or toilet Toileting - Hygiene: Performed;Supervision/safety Where Assessed -  Toileting Hygiene: Sit to stand from 3-in-1 or toilet Ambulation Related to ADLs: Pt ambulating ~150 ft Supervision level with guard postures (Lt UE extended for environmental support) Pt declining RW at this time. ADL Comments: Pt very motivated with multiple surgeries in the past. Pt states " I wanted to get up and move" Pt educated  on back precautions with handout. Pt able to recall 3 out 3 to therapist at end of session . Pt educated on adls with back precautions. Vision/Perception  Vision - History Baseline Vision: Wears glasses only for reading Patient Visual Report: No change from baseline Cognition Cognition Arousal/Alertness: Awake/alert Overall Cognitive Status: Appears within functional limits for tasks assessed Orientation Level: Oriented X4 Sensation/Coordination Coordination Gross Motor Movements are Fluid and Coordinated: Yes Fine Motor Movements are Fluid and Coordinated: Yes Extremity Assessment RUE Assessment RUE Assessment: Within Functional Limits (grossly) LUE Assessment LUE Assessment: Within Functional Limits (grossly) Mobility  Bed Mobility Bed Mobility: Yes Supine to Sit: Other (comment);With rails;HOB elevated (Comment degrees) (min guard with min v/c for sequence) Transfers Transfers: Yes Sit to Stand: Other (comment);With upper extremity assist;From bed (min guard) Stand to Sit: Other (comment);With upper extremity assist;To chair/3-in-1;With armrests (min guard) Exercises   End of Session OT - End of Session Equipment Utilized During Treatment: Gait belt;Back brace Activity Tolerance: Patient tolerated treatment well Patient left: in chair;with call bell in reach Nurse Communication: Mobility status for transfers;Mobility status for ambulation General Behavior During Session: [facility] for tasks performed Cognition: [facility] for tasks  performed   Lucile Shutters 11/16/2011, 2:25 PM  Pager: [phone]

## 2011-11-16 NOTE — Evaluation (Signed)
Physical Therapy Evaluation Patient Details Name: [name] MRN: [id number] DOB: [birth year] Today's Date: 11/16/2011  Problem List:  Patient Active Problem List  Diagnoses  . DKA (diabetic ketoacidoses)  . Diabetes mellitus  . Hypothyroidism  . Hypertension  . Hepatic steatosis    Past Medical History:  Past Medical History  Diagnosis Date  . Diabetes mellitus   . Hypertension   . Hypothyroidism   . PONV (postoperative nausea and vomiting)   . Blood transfusion   . UTI (lower urinary tract infection)   . GERD (gastroesophageal reflux disease)     occ  . Fibromyalgia   . Arthritis   . Depression    Past Surgical History:  Past Surgical History  Procedure Date  . Cholecystectomy   . Appendectomy   . Orthopedic surgeries     multiple  . Cesarean section     x 2  . Knee arthroplasty 09    lft partial  . Knee arthroplasty     rt  . Shoulder open rotator cuff repair     rt and lft  . Breast surgery     breast reduction  . Back surgery 96    PT Assessment/Plan/Recommendation PT Assessment Clinical Impression Statement: Pt is 61 yo female with L3-4 decomp and previous h/o back surgery who is limited on eval by nausea but had been up and walking with OT at earlier time.  Will follow pt acutely but do not anticipate that she will need PT at d/c until appropriate for outpt services.   PT Recommendation/Assessment: Patient will need skilled PT in the acute care venue PT Problem List: Decreased mobility;Pain;Decreased activity tolerance;Decreased knowledge of precautions Barriers to Discharge: None PT Therapy Diagnosis : Difficulty walking;Acute pain PT Plan PT Frequency: Min 5X/week PT Treatment/Interventions: DME instruction;Gait training;Stair training;Functional mobility training;Therapeutic activities;Therapeutic exercise;Patient/family education PT Recommendation Follow Up Recommendations: Outpatient PT Equipment Recommended: None recommended by PT PT Goals   Acute Rehab PT Goals PT Goal Formulation: With patient Time For Goal Achievement: 7 days Pt will go Supine/Side to Sit: with modified independence;with HOB 0 degrees PT Goal: Supine/Side to Sit - Progress: Goal set today Pt will go Sit to Supine/Side: with modified independence;with HOB 0 degrees PT Goal: Sit to Supine/Side - Progress: Goal set today Pt will go Sit to Stand: with modified independence PT Goal: Sit to Stand - Progress: Goal set today Pt will go Stand to Sit: with modified independence PT Goal: Stand to Sit - Progress: Goal set today Pt will Ambulate: >150 feet;with modified independence;with least restrictive assistive device PT Goal: Ambulate - Progress: Goal set today Pt will Go Up / Down Stairs: 3-5 stairs;with supervision PT Goal: Up/Down Stairs - Progress: Goal set today Additional Goals Additional Goal #1: pt to verbalize 3/3 back precautions and keep with mobility PT Goal: Additional Goal #1 - Progress: Goal set today  PT Evaluation Precautions/Restrictions  Precautions Precautions: Fall;Back Precaution Booklet Issued: No (issued by OT) Precaution Comments: reviewed precautions as they relate to bed mobility Required Braces or Orthoses: Yes Spinal Brace: Thoracolumbosacral orthotic;Applied in sitting position Restrictions Weight Bearing Restrictions: No Prior Functioning  Home Living Lives With: Spouse Receives Help From: Family Type of Home: House Home Layout: One level Home Access: Stairs to enter Entrance Stairs-Rails: None Entrance Stairs-Number of Steps: 4 Bathroom Shower/Tub: Psychologist, counselling;Door [employer] Toilet: Standard Home Adaptive Equipment: Bedside commode/3-in-1;Walker - rolling;Straight cane Prior Function Level of Independence: Independent with gait;Independent with transfers;Independent with basic ADLs Able to Take Stairs?:  Yes Driving: Yes Vocation: Retired Producer, television/film/video: Awake/alert Overall Cognitive  Status: Appears within functional limits for tasks assessed Orientation Level: Oriented X4 Sensation/Coordination Sensation Light Touch: Appears Intact Stereognosis: Not tested Hot/Cold: Not tested Proprioception: Appears Intact Coordination Gross Motor Movements are Fluid and Coordinated: Yes Fine Motor Movements are Fluid and Coordinated: Yes Extremity Assessment RUE Assessment RUE Assessment: Within Functional Limits LUE Assessment LUE Assessment: Within Functional Limits RLE Assessment RLE Assessment: Within Functional Limits LLE Assessment LLE Assessment: Within Functional Limits Mobility (including Balance) Bed Mobility Bed Mobility: Yes Rolling Right: 5: Supervision Rolling Right Details (indicate cue type and reason): vc's for precautions Supine to Sit: Other (comment);With rails;HOB elevated (Comment degrees) (min guard with min v/c for sequence) Sit to Sidelying Left: 5: Supervision;HOB flat Sit to Sidelying Left Details (indicate cue type and reason): discussed keeping precautions during mobility, pt did this effectively Transfers Transfers: Yes Sit to Stand: Other (comment);With upper extremity assist;From bed (min guard) Stand to Sit: Other (comment);With upper extremity assist;To chair/3-in-1;With armrests (min guard) Stand Pivot Transfers: 5: Supervision;With armrests (from chair to bed) Ambulation/Gait Ambulation/Gait: No (pt deferred to next treatment due to nausea) Stairs: No Wheelchair Mobility Wheelchair Mobility: No  Posture/Postural Control Posture/Postural Control: No significant limitations Balance Balance Assessed: Yes Dynamic Sitting Balance Dynamic Sitting - Balance Support: No upper extremity supported;During functional activity;Feet supported Dynamic Sitting - Level of Assistance: 6: Modified independent (Device/Increase time) Exercise  General Exercises - Lower Extremity Ankle Circles/Pumps: AROM;Both;20 reps;Supine Short Arc Quad:  AROM;Both;10 reps;Supine End of Session PT - End of Session Equipment Utilized During Treatment: Gait belt;Back brace Activity Tolerance: Other (comment) (pt limited by nausea) Patient left: in bed;with call bell in reach Nurse Communication: Mobility status for transfers;Mobility status for ambulation General Behavior During Session: [facility] for tasks performed Cognition: [facility] for tasks performed  Murray Guzzetta, [place] 11/16/2011, 4:28 PM  Lyanne Co, PT  Acute [employer]  [phone]

## 2011-11-17 LAB — GLUCOSE, CAPILLARY
Glucose-Capillary: 118 mg/dL — ABNORMAL HIGH (ref 70–99)
Glucose-Capillary: 143 mg/dL — ABNORMAL HIGH (ref 70–99)
Glucose-Capillary: 190 mg/dL — ABNORMAL HIGH (ref 70–99)

## 2011-11-17 MED ORDER — MORPHINE SULFATE (PF) 1 MG/ML IV SOLN
INTRAVENOUS | Status: AC
  Administered 2011-11-17: 07:00:00
  Filled 2011-11-17: qty 25

## 2011-11-17 MED ORDER — TRAMADOL HCL 50 MG PO TABS: 50.0000 mg | ORAL_TABLET | Freq: Four times a day (QID) | ORAL | Status: DC | PRN

## 2011-11-17 MED ORDER — MORPHINE SULFATE 2 MG/ML IJ SOLN: 1.0000 mg | INTRAMUSCULAR | Status: DC | PRN

## 2011-11-17 MED ORDER — OXYCODONE-ACETAMINOPHEN 5-325 MG PO TABS
1.0000 | ORAL_TABLET | ORAL | Status: DC | PRN
  Administered 2011-11-18: 2 via ORAL
  Administered 2011-11-19: 1 via ORAL
  Filled 2011-11-17 (×2): qty 2

## 2011-11-17 MED ORDER — HYDROCODONE-ACETAMINOPHEN 10-325 MG PO TABS
1.0000 | ORAL_TABLET | ORAL | Status: DC | PRN
  Administered 2011-11-17: 2 via ORAL
  Administered 2011-11-17: 1 via ORAL
  Administered 2011-11-18 (×3): 2 via ORAL
  Administered 2011-11-18: 1 via ORAL
  Filled 2011-11-17 (×6): qty 2

## 2011-11-17 NOTE — Progress Notes (Signed)
Physical Therapy Treatment Patient Details Name: [name] MRN: [id number] DOB: Mar 04, 1951 Today's Date: 11/17/2011  PT Assessment/Plan  PT - Assessment/Plan Comments on Treatment Session: Pt was able to amb without nasuea. Pt was able to amb stairs without unsteadiness or LOB. Pt making progess.   PT Plan: Discharge plan remains appropriate;Frequency remains appropriate PT Frequency: Min 5X/week Follow Up Recommendations: Outpatient PT Equipment Recommended: None recommended by PT PT Goals  Acute Rehab PT Goals PT Goal: Sit to Stand - Progress: Progressing toward goal Pt will go Stand to Sit: with modified independence  PT Treatment Precautions/Restrictions  Precautions Precautions: Back;Fall Precaution Booklet Issued: No (issued by OT) Precaution Comments: Pt was able to recall 3/3 back precautions Required Braces or Orthoses DO NOT USE: Yes Spinal Brace: Thoracolumbosacral orthotic;Applied in sitting position Restrictions Weight Bearing Restrictions: No Mobility (including Balance) Transfers Transfers: Yes Sit to Stand: With upper extremity assist;From chair/3-in-1;5: Supervision Stand to Sit: 5: Supervision;To chair/3-in-1;With upper extremity assist Ambulation/Gait Ambulation/Gait: Yes Ambulation/Gait Assistance: 4: Min assist Ambulation Distance (Feet): 400 Feet Assistive device: Rolling walker Gait Pattern: Within Functional Limits Stairs: Yes Stairs Assistance: Other (comment) Stair Management Technique: One rail Right;Backwards;Forwards Number of Stairs: 10  (practiced 5 steps x 2 in gym ) Wheelchair Mobility Wheelchair Mobility: No    Exercise    End of Session PT - End of Session Equipment Utilized During Treatment: Gait belt;Back brace Activity Tolerance: Patient tolerated treatment well Patient left: in chair Nurse Communication: Mobility status for transfers;Mobility status for ambulation General Behavior During Session: [facility] for tasks  performed Cognition: [facility] for tasks performed  Tamera Stands  11/17/2011, 1:28 PM

## 2011-11-17 NOTE — Progress Notes (Signed)
Occupational Therapy Treatment Patient Details Name: [name] MRN: [id number] DOB: [birth year] Today's Date: 11/17/2011  OT Assessment/Plan OT Assessment/Plan Comments on Treatment Session: Pt progressing well this session and c/o nausea. Pt is at adequate level for d/c home with (A) of family. Equipment Recommended: None recommended by OT OT Goals ADL Goals Pt Will Perform Lower Body Bathing: with modified independence;with adaptive equipment;Sit to stand from bed;Sit to stand from chair ADL Goal: Lower Body Bathing - Progress: Progressing toward goals Pt Will Perform Lower Body Dressing: with modified independence;Sit to stand from chair;Sit to stand from bed;with adaptive equipment ADL Goal: Lower Body Dressing - Progress: Progressing toward goals Miscellaneous OT Goals Miscellaneous OT Goal #1: PT will perform bed mobility Mod I hob 20 degrees or less no bed rail as precursor to adls OT Goal: Miscellaneous Goal #1 - Progress: Progressing toward goals Miscellaneous OT Goal #2: Pt will don / doff brace Mod I with family (A) PRN as precursor to ADLS OT Goal: Miscellaneous Goal #2 - Progress: Progressing toward goals  OT Treatment Precautions/Restrictions  Precautions Precautions: Back;Fall Precaution Booklet Issued: Yes (comment) Precaution Comments: Pt was able to recall 3/3 back precautions Spinal Brace: Thoracolumbosacral orthotic;Applied in sitting position Restrictions Weight Bearing Restrictions: No   ADL ADL Grooming: Performed;Wash/dry hands;Wash/dry face;Teeth care;Supervision/safety Where Assessed - Grooming: Standing at sink Upper Body Dressing: Performed;Minimal assistance Upper Body Dressing Details (indicate cue type and reason): (A) with straps on TLSO Where Assessed - Upper Body Dressing: Sitting, bed;Unsupported Tub/Shower Transfer: Performed;Supervision/safety Tub/Shower Transfer Method: Science writer: Walk in  shower Ambulation Related to ADLs: Pt ambulating ~200 ft with Supervision level (A). Pt demonstrates good balance with ambulation ADL Comments: Pt performed peri hygiene at sink level and normally uses tongs. Pt currently without tongs Mobility  Bed Mobility Bed Mobility: Yes Rolling Right: 5: Supervision Supine to Sit: With rails;HOB elevated (Comment degrees);5: Supervision Transfers Transfers: Yes Sit to Stand: With upper extremity assist;From chair/3-in-1;5: Supervision Stand to Sit: 5: Supervision;To chair/3-in-1;With upper extremity assist Exercises    End of Session General Behavior During Session: [facility] for tasks performed Cognition: [facility] for tasks performed  Lucile Shutters  11/17/2011, 2:50 PM Pager: [phone]

## 2011-11-17 NOTE — Progress Notes (Signed)
Doing well. C/o appropriate incisional soreness. No leg pain No Numbness, tingling, weakness No Nausea /vomiting Amb/ voiding well  Temp:  [97.8 F (36.6 C)-99.5 F (37.5 C)] 99.4 F (37.4 C) (04/11 0600) Pulse Rate:  [85-102] 92  (04/11 0600) Resp:  [18-25] 20  (04/11 0633) BP: (84-128)/(56-70) 110/66 mmHg (04/11 0600) SpO2:  [92 %-97 %] 97 % (04/11 0633) FiO2 (%):  [38 %-43 %] 38 % (04/10 1600) Good strength and sensation Incision CDI  Plan: Change to po meds - increase activity

## 2011-11-17 NOTE — Progress Notes (Signed)
11/17/2011 Fredrich Birks PTA [phone] pager [phone] office

## 2011-11-18 LAB — GLUCOSE, CAPILLARY

## 2011-11-18 MED ORDER — OXYCODONE-ACETAMINOPHEN 5-325 MG PO TABS: 1.0000 | ORAL_TABLET | ORAL | Status: AC | PRN

## 2011-11-18 MED ORDER — CYCLOBENZAPRINE HCL 10 MG PO TABS: 10.0000 mg | ORAL_TABLET | Freq: Three times a day (TID) | ORAL | Status: AC | PRN

## 2011-11-18 MED FILL — Sodium Chloride IV Soln 0.9%: INTRAVENOUS | Qty: 1000 | Status: AC

## 2011-11-18 MED FILL — Sodium Chloride Irrigation Soln 0.9%: Qty: 3000 | Status: AC

## 2011-11-18 MED FILL — Heparin Sodium (Porcine) Inj 1000 Unit/ML: INTRAMUSCULAR | Qty: 30 | Status: AC

## 2011-11-18 NOTE — Progress Notes (Signed)
11/18/2011 Anye Brose Elizabeth PTA [phone] pager [phone] office    

## 2011-11-18 NOTE — Progress Notes (Signed)
Physical Therapy Treatment Patient Details Name: [name] MRN: [id number] DOB: 02-Mar-1951 Today's Date: 11/18/2011  PT Assessment/Plan  PT - Assessment/Plan Comments on Treatment Session: Pt was able to amb well despite fever. Pt showed improved gait pattern and balnace. Pt progessing towards goals.   PT Plan: Discharge plan remains appropriate;Frequency remains appropriate PT Frequency: Min 5X/week Follow Up Recommendations: Outpatient PT Equipment Recommended: None recommended by PT PT Goals  Acute Rehab PT Goals PT Goal: Supine/Side to Sit - Progress: Progressing toward goal PT Goal: Sit to Stand - Progress: Progressing toward goal PT Goal: Stand to Sit - Progress: Progressing toward goal PT Goal: Ambulate - Progress: Progressing toward goal  PT Treatment Precautions/Restrictions  Precautions Precautions: Back;Fall Precaution Booklet Issued: Yes (comment) Precaution Comments: Pt was able to recall 3/3 back precautions Required Braces or Orthoses DO NOT USE: Yes Spinal Brace: Thoracolumbosacral orthotic;Applied in sitting position (Pt able to put on I ) Restrictions Weight Bearing Restrictions: No Mobility (including Balance) Bed Mobility Rolling Left: 5: Supervision;With rail Supine to Sit: With rails;5: Supervision Transfers Transfers: Yes Sit to Stand: 5: Supervision;With upper extremity assist;From chair/3-in-1 Stand to Sit: 5: Supervision;To chair/3-in-1;With upper extremity assist Ambulation/Gait Ambulation/Gait: Yes Ambulation/Gait Assistance: 4: Min assist (Minguard for Safety ) Ambulation/Gait Assistance Details (indicate cue type and reason): Pt required VC to not utilize walls during amb.   Ambulation Distance (Feet): 400 Feet Assistive device: Rolling walker Gait Pattern: Decreased stride length;Step-through pattern Stairs: No Wheelchair Mobility Wheelchair Mobility: No  End of Session PT - End of Session Equipment Utilized During Treatment: Gait  belt Activity Tolerance: Patient tolerated treatment well Patient left: in chair;with call bell in reach Nurse Communication: Mobility status for transfers;Mobility status for ambulation General Behavior During Session: [facility] for tasks performed Cognition: [facility] for tasks performed  Tamera Stands 11/18/2011, 9:08 AM

## 2011-11-18 NOTE — Progress Notes (Signed)
Doing well. C/o appropriate incisional soreness.  sl Nausea / no vomiting Amb/ voiding well No bm yet   Temp:  [97 F (36.1 C)-100.5 F (38.1 C)] 99.5 F (37.5 C) (04/12 0819) Pulse Rate:  [88-103] 101  (04/12 0819) Resp:  [17-22] 22  (04/12 0819) BP: (100-150)/(61-82) 138/72 mmHg (04/12 0819) SpO2:  [94 %-100 %] 95 % (04/12 0819) Weight:  [82.146 kg (181 lb 1.6 oz)] 82.146 kg (181 lb 1.6 oz) (04/11 1839) Good strength and sensation Incision CDI  Plan: Increase activity - d/c home next 1-2 days?

## 2011-11-19 LAB — GLUCOSE, CAPILLARY
Glucose-Capillary: 165 mg/dL — ABNORMAL HIGH (ref 70–99)
Glucose-Capillary: 70 mg/dL (ref 70–99)
Glucose-Capillary: 140 mg/dL — ABNORMAL HIGH (ref 70–99)

## 2011-11-19 NOTE — Discharge Summary (Signed)
Physician Discharge Summary  Patient ID: CLETIS MUMA MRN: [id number] DOB/AGE: 08-26-1950 61 y.o.  Admit date: 11/15/2011 Discharge date: 11/19/2011  Admission Diagnoses: Lumbar stenosis, lumbar spondylosis, lumbar degenerative disc disease  Discharge Diagnoses: Lumbar stenosis, lumbar spondylosis, lumbar degenerative disc disease  Discharged Condition: good  Hospital Course: Patient was admitted by Dr. Phoebe Perch and underwent a L3-4 lumbar decompression and fusion, including removal of previous screws at L5 and S1. She has done well following surgery. He is up and ambulate. Her wound is clean and dry. She is asked to be discharged to home. She's been given instructions regarding wound care and activities. She is to return for followup with Dr. Phoebe Perch in the office.  Discharge Exam: Blood pressure 106/68, pulse 88, temperature 97.2 F (36.2 C), temperature source Oral, resp. rate 18, height 5\' 5"  (1.651 m), weight 82.146 kg (181 lb 1.6 oz), SpO2 96.00%.  Disposition: Home   Medication List  As of 11/19/2011  9:20 AM   STOP taking these medications         meloxicam 7.5 MG tablet         TAKE these medications         aspirin 81 MG tablet   Take 81 mg by mouth daily.      CALCIUM 600 + D PO   Take 1 tablet by mouth daily.      colesevelam 625 MG tablet   Commonly known as: WELCHOL   Take 1,250 mg by mouth 2 (two) times daily with a meal.      cyclobenzaprine 10 MG tablet   Commonly known as: FLEXERIL   Take 10 mg by mouth 3 (three) times daily as needed. For muscle pain      cyclobenzaprine 10 MG tablet   Commonly known as: FLEXERIL   Take 1 tablet (10 mg total) by mouth 3 (three) times daily as needed for muscle spasms.      esomeprazole 20 MG capsule   Commonly known as: NEXIUM   Take 20 mg by mouth as needed.      HYDROcodone-acetaminophen 5-500 MG per tablet   Commonly known as: VICODIN   Take 1 tablet by mouth every 6 (six) hours as needed. For pain     insulin glargine 100 UNIT/ML injection   Commonly known as: LANTUS   Inject 30 Units into the skin at bedtime.      insulin lispro 100 UNIT/ML injection   Commonly known as: HUMALOG   Take 10 u meal coverage plus SSI: If CBG < 70: Drink juice; CBG 70-120: 0 u; CBG 121-150: 3 u; CBG 151-200: 4 u; CBG 201-250: 7 u; CBG 251-300: 11 u;CBG 301-350: 15 u; CBG 351-400: 20 u; CBG > 400: call MD        levothyroxine 125 MCG tablet   Commonly known as: SYNTHROID, LEVOTHROID   Take 125 mcg by mouth daily.      losartan-hydrochlorothiazide 100-12.5 MG per tablet   Commonly known as: HYZAAR   Take 1 tablet by mouth daily.      metFORMIN 1000 MG tablet   Commonly known as: GLUCOPHAGE   Take 1,000 mg by mouth 2 (two) times daily with a meal.      metoprolol succinate 100 MG 24 hr tablet   Commonly known as: TOPROL-XL   Take 100 mg by mouth daily.      mulitivitamin with minerals Tabs   Take 1 tablet by mouth daily.      omega-3 acid  ethyl esters 1 G capsule   Commonly known as: LOVAZA   Take 2 g by mouth daily.      oxyCODONE-acetaminophen 5-325 MG per tablet   Commonly known as: PERCOCET   Take 1-2 tablets by mouth every 4 (four) hours as needed. For pain      oxyCODONE-acetaminophen 5-325 MG per tablet   Commonly known as: PERCOCET   Take 1-2 tablets by mouth every 4 (four) hours as needed for pain.      promethazine 25 MG suppository   Commonly known as: PHENERGAN   Place 25 mg rectally every 6 (six) hours as needed. For nausea.      temazepam 30 MG capsule   Commonly known as: RESTORIL   Take 30 mg by mouth at bedtime as needed. For sleep      venlafaxine XR 37.5 MG 24 hr capsule   Commonly known as: EFFEXOR-XR   Take 37.5 mg by mouth daily.             Signed: Hewitt Shorts, MD 11/19/2011, 9:20 AM

## 2011-11-19 NOTE — Progress Notes (Signed)
PT Cancellation Note  Treatment cancelled today due to patient's refusal to participate. Pt to go home today. Spoke with pt and spouse to ensure they had no mobility questions. Both able to independently recall 3/3 back precautions and felt comfortable with bracing and all mobility. Politely declined therapy today due to ready to go home. RN notified.  Sallyanne Kuster 11/19/2011, 11:01 AM  Sallyanne Kuster, PTA Office- [phone] Pager- [phone]

## 2011-11-21 LAB — GLUCOSE, CAPILLARY: Glucose-Capillary: 145 mg/dL — ABNORMAL HIGH (ref 70–99)

## 2012-05-15 ENCOUNTER — Other Ambulatory Visit: Payer: Self-pay | Admitting: Obstetrics and Gynecology

## 2012-05-15 DIAGNOSIS — Z9889 Other specified postprocedural states: Secondary | ICD-10-CM

## 2012-05-15 DIAGNOSIS — Z1231 Encounter for screening mammogram for malignant neoplasm of breast: Secondary | ICD-10-CM

## 2012-06-04 ENCOUNTER — Other Ambulatory Visit: Payer: Self-pay

## 2012-06-04 ENCOUNTER — Encounter (HOSPITAL_BASED_OUTPATIENT_CLINIC_OR_DEPARTMENT_OTHER): Payer: Self-pay | Admitting: *Deleted

## 2012-06-04 ENCOUNTER — Encounter (HOSPITAL_BASED_OUTPATIENT_CLINIC_OR_DEPARTMENT_OTHER)
Admission: RE | Admit: 2012-06-04 | Discharge: 2012-06-04 | Disposition: A | Discharge status: Home / Self Care | Payer: 59 | Source: Ambulatory Visit | Attending: Orthopedic Surgery | Admitting: Orthopedic Surgery

## 2012-06-04 LAB — BASIC METABOLIC PANEL
Calcium: 9.9 mg/dL (ref 8.4–10.5)
Chloride: 96 mEq/L (ref 96–112)
Creatinine, Ser: 0.8 mg/dL (ref 0.50–1.10)
GFR calc Af Amer: >90 mL/min (ref >90)
GFR calc non Af Amer: 78 mL/min — ABNORMAL LOW (ref >90)

## 2012-06-04 NOTE — Progress Notes (Signed)
Pt here from Dr. Stark Jock office.  Dr. Ivin Booty shown EKG from dec 2012 and April 2013 -- ordered to get another EKG today.

## 2012-06-04 NOTE — H&P (Signed)
[name] is an 61 y.o. female.   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  I have treated her in the past for first dorsal compartment release in the 1990's. She has had type II diabetes for 12 years, her last hemoglobin A1C was elevated at 9.0.  She has been caring for her 46 year-old mother who is quite ill at this time.  She has a cyst in her left first dorsal compartment, chronic stenosing tenosynovitis symptoms of the first dorsal compartment and bilateral hand numbness. She seeks a follow-up upper extremity orthopaedic consult.     Past Medical History  Diagnosis Date  . Diabetes mellitus   . Hypertension   . Hypothyroidism   . PONV (postoperative nausea and vomiting)   . Blood transfusion   . UTI (lower urinary tract infection)   . GERD (gastroesophageal reflux disease)     occ  . Fibromyalgia   . Arthritis   . Depression     Past Surgical History  Procedure Date  . Cholecystectomy   . Appendectomy   . Orthopedic surgeries     multiple  . Cesarean section     x 2  . Knee arthroplasty 09    lft partial  . Knee arthroplasty     rt  . Shoulder open rotator cuff repair     rt and lft  . Breast surgery     breast reduction  . Back surgery 96    Jan 2013 - spinal fusion@ [facility]    Family History  Problem Relation Age of Onset  . Diabetes type II    . Hypothyroidism     Social History:  reports that she has never smoked. She has never used smokeless tobacco. She reports that she does not drink alcohol or use illicit drugs.  Allergies:  Allergies  Allergen Reactions  . Codeine Nausea And Vomiting  . Penicillins Rash    No prescriptions prior to admission    Results for orders placed during the hospital encounter of 06/05/12 (from the past 48 hour(s))  BASIC METABOLIC PANEL     Status:  Abnormal   Collection Time   06/04/12  2:00 PM      Component Value Range Comment   Sodium 134 (*) 135 - 145 mEq/L    Potassium 4.3  3.5 - 5.1 mEq/L    Chloride 96  96 - 112 mEq/L    CO2 28  19 - 32 mEq/L    Glucose, Bld 307 (*) 70 - 99 mg/dL    BUN 14  6 - 23 mg/dL    Creatinine, Ser 1.61  0.50 - 1.10 mg/dL    Calcium 9.9  8.4 - 09.6 mg/dL    GFR calc non Af Amer 78 (*) >90 mL/min    GFR calc Af Amer >90  >90 mL/min     No results found.   Pertinent items are noted in HPI.  Height 5\' 4"  (1.626 m), weight 83.915 kg (185 lb).  General appearance: alert Head: Normocephalic, without obvious abnormality Neck: supple, symmetrical, trachea midline Resp: clear to auscultation bilaterally Cardio: regular rate and rhythm GI: normal findings: aorta normal Extremities:.  Inspection of her hands reveals swelling at her left first dorsal compartment. She has a palpable myxoid cyst.  She has a painful Finkelstein maneuver. She  has no sign of stenosing tenosynovitis of her fingers or thumbs.  She has pain with CMC translation and grind on the left evidencing osteoarthritis.  She also has a palpable loose body at her olecranon bursa at the medial aspect of her left elbow adjacent to the medial epicondyle and cubital groove.   On palpation of her arm there is also a small mass in the subcutaneous region of her distal posterior brachium.    X-rays of her thumb, wrist and hand as well as elbow films demonstrate that she has loose body near her medial olecranon probably in the bursa. She has what appears to be calcified adipose tissue or other lesion in the subcutaneous region of her left brachium.  Eaton stage III pan trapezial CMC arthrosis and mild narrowing of all of her interphalangeal joints.  Due to her symptoms of numbness consistent with carpal tunnel syndrome, Dr. Johna Roles performed screening electrodiagnostic studies.  These confirm bilateral moderately severe carpal tunnel syndrome with  markedly diminished sensory amplitudes.   Pulses: 2+ and symmetric Skin: normal Neurologic: Grossly normal    Assessment/Plan Impression:1)  Bilateral carpal tunnel syndrome. 2)Left first dorsal  compartment stenosing tenosynovitis. 3)Myxoid cyst left first dorsal compartment.  Plan:To the OR for left CTR and release left 1st DC with cyst excision.The procedure, risks,benefits and post-op course were discussed with the patient at length and they were in agreement with the plan.   DASNOIT,[name] 06/04/2012, 9:22 PM     H&P documentation: 06/05/2012  -History and Physical Reviewed  -Patient has been re-examined  -No change in the plan of care  Wyn Forster, MD

## 2012-06-05 ENCOUNTER — Other Ambulatory Visit: Payer: Self-pay | Admitting: Orthopedic Surgery

## 2012-06-05 ENCOUNTER — Encounter (HOSPITAL_BASED_OUTPATIENT_CLINIC_OR_DEPARTMENT_OTHER)
Admission: RE | Disposition: A | Discharge status: Home / Self Care | Payer: Self-pay | Source: Ambulatory Visit | Attending: Orthopedic Surgery

## 2012-06-05 ENCOUNTER — Ambulatory Visit (HOSPITAL_BASED_OUTPATIENT_CLINIC_OR_DEPARTMENT_OTHER)
Admission: RE | Admit: 2012-06-05 | Discharge: 2012-06-05 | Disposition: A | Discharge status: Home / Self Care | Payer: 59 | Source: Ambulatory Visit | Attending: Orthopedic Surgery | Admitting: Orthopedic Surgery

## 2012-06-05 ENCOUNTER — Encounter (HOSPITAL_BASED_OUTPATIENT_CLINIC_OR_DEPARTMENT_OTHER): Payer: Self-pay | Admitting: Anesthesiology

## 2012-06-05 ENCOUNTER — Ambulatory Visit (HOSPITAL_BASED_OUTPATIENT_CLINIC_OR_DEPARTMENT_OTHER): Payer: 59 | Admitting: Anesthesiology

## 2012-06-05 ENCOUNTER — Encounter (HOSPITAL_BASED_OUTPATIENT_CLINIC_OR_DEPARTMENT_OTHER): Payer: Self-pay | Admitting: *Deleted

## 2012-06-05 DIAGNOSIS — F329 Major depressive disorder, single episode, unspecified: Secondary | ICD-10-CM | POA: Insufficient documentation

## 2012-06-05 DIAGNOSIS — E039 Hypothyroidism, unspecified: Secondary | ICD-10-CM | POA: Insufficient documentation

## 2012-06-05 DIAGNOSIS — Z01812 Encounter for preprocedural laboratory examination: Secondary | ICD-10-CM | POA: Insufficient documentation

## 2012-06-05 DIAGNOSIS — K219 Gastro-esophageal reflux disease without esophagitis: Secondary | ICD-10-CM | POA: Insufficient documentation

## 2012-06-05 DIAGNOSIS — E119 Type 2 diabetes mellitus without complications: Secondary | ICD-10-CM | POA: Insufficient documentation

## 2012-06-05 DIAGNOSIS — Z01810 Encounter for preprocedural cardiovascular examination: Secondary | ICD-10-CM | POA: Insufficient documentation

## 2012-06-05 DIAGNOSIS — M654 Radial styloid tenosynovitis [de Quervain]: Secondary | ICD-10-CM | POA: Insufficient documentation

## 2012-06-05 DIAGNOSIS — F3289 Other specified depressive episodes: Secondary | ICD-10-CM | POA: Insufficient documentation

## 2012-06-05 DIAGNOSIS — I10 Essential (primary) hypertension: Secondary | ICD-10-CM | POA: Insufficient documentation

## 2012-06-05 DIAGNOSIS — G5600 Carpal tunnel syndrome, unspecified upper limb: Secondary | ICD-10-CM | POA: Insufficient documentation

## 2012-06-05 DIAGNOSIS — M6740 Ganglion, unspecified site: Secondary | ICD-10-CM | POA: Insufficient documentation

## 2012-06-05 HISTORY — PX: DORSAL COMPARTMENT RELEASE: SHX5039

## 2012-06-05 HISTORY — PX: CARPAL TUNNEL RELEASE: SHX101

## 2012-06-05 LAB — GLUCOSE, CAPILLARY: Glucose-Capillary: 112 mg/dL — ABNORMAL HIGH (ref 70–99)

## 2012-06-05 SURGERY — RELEASE, FIRST DORSAL COMPARTMENT, HAND
Anesthesia: General | Site: Wrist | Laterality: Left | Wound class: Clean

## 2012-06-05 MED ORDER — GLYCOPYRROLATE 0.2 MG/ML IJ SOLN
0.2000 mg | Freq: Once | INTRAMUSCULAR | Status: AC
  Administered 2012-06-05: 0.2 mg via INTRAVENOUS

## 2012-06-05 MED ORDER — OXYCODONE HCL 5 MG/5ML PO SOLN: 5.0000 mg | Freq: Once | ORAL | Status: AC | PRN

## 2012-06-05 MED ORDER — LIDOCAINE HCL 2 % IJ SOLN
INTRAMUSCULAR | Status: DC | PRN
  Administered 2012-06-05: 5 mL

## 2012-06-05 MED ORDER — ONDANSETRON HCL 4 MG/2ML IJ SOLN
INTRAMUSCULAR | Status: DC | PRN
  Administered 2012-06-05: 4 mg via INTRAVENOUS

## 2012-06-05 MED ORDER — EPHEDRINE SULFATE 50 MG/ML IJ SOLN
INTRAMUSCULAR | Status: DC | PRN
  Administered 2012-06-05: 10 mg via INTRAVENOUS

## 2012-06-05 MED ORDER — FENTANYL CITRATE 0.05 MG/ML IJ SOLN
INTRAMUSCULAR | Status: DC | PRN
  Administered 2012-06-05: 100 ug via INTRAVENOUS

## 2012-06-05 MED ORDER — OXYCODONE HCL 5 MG PO TABS
5.0000 mg | ORAL_TABLET | Freq: Once | ORAL | Status: AC | PRN
  Administered 2012-06-05: 5 mg via ORAL

## 2012-06-05 MED ORDER — PROPOFOL 10 MG/ML IV BOLUS
INTRAVENOUS | Status: DC | PRN
  Administered 2012-06-05: 180 mg via INTRAVENOUS

## 2012-06-05 MED ORDER — HYDROCODONE-ACETAMINOPHEN 5-325 MG PO TABS: ORAL_TABLET | ORAL | Status: DC

## 2012-06-05 MED ORDER — LIDOCAINE HCL (CARDIAC) 20 MG/ML IV SOLN
INTRAVENOUS | Status: DC | PRN
  Administered 2012-06-05: 80 mg via INTRAVENOUS

## 2012-06-05 MED ORDER — LACTATED RINGERS IV SOLN
INTRAVENOUS | Status: DC
  Administered 2012-06-05 (×2): via INTRAVENOUS

## 2012-06-05 MED ORDER — PROMETHAZINE HCL 25 MG/ML IJ SOLN
6.2500 mg | INTRAMUSCULAR | Status: AC | PRN
  Administered 2012-06-05 (×2): 6.25 mg via INTRAVENOUS

## 2012-06-05 MED ORDER — HYDROMORPHONE HCL PF 1 MG/ML IJ SOLN
0.2500 mg | INTRAMUSCULAR | Status: DC | PRN
  Administered 2012-06-05: 0.5 mg via INTRAVENOUS

## 2012-06-05 MED ORDER — MEPERIDINE HCL 25 MG/ML IJ SOLN: 6.2500 mg | INTRAMUSCULAR | Status: DC | PRN

## 2012-06-05 SURGICAL SUPPLY — 45 items
BANDAGE ADHESIVE 1X3 (GAUZE/BANDAGES/DRESSINGS) IMPLANT
BANDAGE ELASTIC 3 VELCRO ST LF (GAUZE/BANDAGES/DRESSINGS) ×3 IMPLANT
BLADE MINI RND TIP GREEN BEAV (BLADE) IMPLANT
BLADE SURG 15 STRL LF DISP TIS (BLADE) ×2 IMPLANT
BLADE SURG 15 STRL SS (BLADE) ×1
BNDG ESMARK 4X9 LF (GAUZE/BANDAGES/DRESSINGS) ×3 IMPLANT
BRUSH SCRUB EZ PLAIN DRY (MISCELLANEOUS) ×3 IMPLANT
CLOTH BEACON ORANGE TIMEOUT ST (SAFETY) ×3 IMPLANT
CORDS BIPOLAR (ELECTRODE) ×3 IMPLANT
COVER MAYO STAND STRL (DRAPES) ×3 IMPLANT
COVER TABLE BACK 60X90 (DRAPES) ×3 IMPLANT
CUFF TOURNIQUET SINGLE 18IN (TOURNIQUET CUFF) ×3 IMPLANT
DECANTER SPIKE VIAL GLASS SM (MISCELLANEOUS) IMPLANT
DRAPE EXTREMITY T 121X128X90 (DRAPE) ×3 IMPLANT
DRAPE SURG 17X23 STRL (DRAPES) ×3 IMPLANT
DRSG TEGADERM 4X4.75 (GAUZE/BANDAGES/DRESSINGS) IMPLANT
GLOVE BIO SURGEON STRL SZ 6.5 (GLOVE) ×3 IMPLANT
GLOVE BIOGEL M STRL SZ7.5 (GLOVE) ×3 IMPLANT
GLOVE EXAM NITRILE EXT CUFF MD (GLOVE) ×3 IMPLANT
GLOVE ORTHO TXT STRL SZ7.5 (GLOVE) ×3 IMPLANT
GOWN BRE IMP PREV XXLGXLNG (GOWN DISPOSABLE) ×6 IMPLANT
GOWN PREVENTION PLUS XLARGE (GOWN DISPOSABLE) ×3 IMPLANT
NDL SAFETY ECLIPSE 18X1.5 (NEEDLE) ×2 IMPLANT
NEEDLE 27GAX1X1/2 (NEEDLE) ×3 IMPLANT
NEEDLE HYPO 18GX1.5 SHARP (NEEDLE) ×1
NS IRRIG 1000ML POUR BTL (IV SOLUTION) ×3 IMPLANT
PACK BASIN DAY SURGERY FS (CUSTOM PROCEDURE TRAY) ×3 IMPLANT
PAD CAST 3X4 CTTN HI CHSV (CAST SUPPLIES) ×2 IMPLANT
PADDING CAST ABS 4INX4YD NS (CAST SUPPLIES)
PADDING CAST ABS COTTON 4X4 ST (CAST SUPPLIES) IMPLANT
PADDING CAST COTTON 3X4 STRL (CAST SUPPLIES) ×1
SLEEVE SCD COMPRESS KNEE MED (MISCELLANEOUS) IMPLANT
SPLINT PLASTER CAST XFAST 3X15 (CAST SUPPLIES) ×10 IMPLANT
SPLINT PLASTER XTRA FASTSET 3X (CAST SUPPLIES) ×5
SPONGE GAUZE 4X4 12PLY (GAUZE/BANDAGES/DRESSINGS) IMPLANT
STOCKINETTE 4X48 STRL (DRAPES) ×3 IMPLANT
STRIP CLOSURE SKIN 1/2X4 (GAUZE/BANDAGES/DRESSINGS) ×3 IMPLANT
SUT PROLENE 3 0 PS 2 (SUTURE) ×3 IMPLANT
SUT VIC AB 4-0 P-3 18XBRD (SUTURE) IMPLANT
SUT VIC AB 4-0 P3 18 (SUTURE)
SYR 3ML 23GX1 SAFETY (SYRINGE) IMPLANT
SYR CONTROL 10ML LL (SYRINGE) ×3 IMPLANT
TRAY DSU PREP LF (CUSTOM PROCEDURE TRAY) ×3 IMPLANT
UNDERPAD 30X30 INCONTINENT (UNDERPADS AND DIAPERS) ×3 IMPLANT
WATER STERILE IRR 1000ML POUR (IV SOLUTION) IMPLANT

## 2012-06-05 NOTE — Brief Op Note (Signed)
06/05/2012  9:02 AM  PATIENT:  [name]  61 y.o. female  PRE-OPERATIVE DIAGNOSIS:  Left Dequervains/Left carpal Tunnel Syndrome  POST-OPERATIVE DIAGNOSIS:  Left Dequervains/Left carpal Tunnel Syndrome  PROCEDURE:  Procedure(s) (LRB) with comments: RELEASE DORSAL COMPARTMENT (DEQUERVAIN) (Left) - Excision of mixoid cyst also CARPAL TUNNEL RELEASE (Left)  SURGEON:  Surgeon(s) and Role:    * Wyn Forster., MD - Primary  PHYSICIAN ASSISTANT:   ASSISTANTS:See Beharry Dasnoit,P.A-C   ANESTHESIA:   general  EBL:     BLOOD ADMINISTERED:none  DRAINS: none   LOCAL MEDICATIONS USED:  LIDOCAINE   SPECIMEN:  No Specimen  DISPOSITION OF SPECIMEN:  N/A  COUNTS:  YES  TOURNIQUET:  * Missing tourniquet times found for documented tourniquets in log:  [id number] *  DICTATION: .Other Dictation: Dictation Number [id number]  PLAN OF CARE: Discharge to home after PACU  PATIENT DISPOSITION:  PACU - hemodynamically stable.

## 2012-06-05 NOTE — Op Note (Signed)
[id number] 

## 2012-06-05 NOTE — Anesthesia Preprocedure Evaluation (Signed)
Anesthesia Evaluation  Patient identified by MRN, date of birth, ID band Patient awake    Reviewed: Allergy & Precautions, H&P , NPO status , Patient's Chart, lab work & pertinent test results  History of Anesthesia Complications (+) PONV  Airway Mallampati: II  Neck ROM: Full    Dental  (+) Teeth Intact and Dental Advisory Given   Pulmonary  breath sounds clear to auscultation        Cardiovascular hypertension, Rhythm:Regular Rate:Normal     Neuro/Psych Depression  Neuromuscular disease    GI/Hepatic GERD-  ,  Endo/Other  diabetes, Well Controlled  Renal/GU      Musculoskeletal  (+) Fibromyalgia -  Abdominal (+) + obese,   Peds  Hematology   Anesthesia Other Findings   Reproductive/Obstetrics                           Anesthesia Physical Anesthesia Plan  ASA: III  Anesthesia Plan: General   Post-op Pain Management:    Induction: Intravenous  Airway Management Planned: LMA  Additional Equipment:   Intra-op Plan:   Post-operative Plan: Extubation in OR  Informed Consent: I have reviewed the patients History and Physical, chart, labs and discussed the procedure including the risks, benefits and alternatives for the proposed anesthesia with the patient or authorized representative who has indicated his/her understanding and acceptance.   Dental advisory given  Plan Discussed with: CRNA and Surgeon  Anesthesia Plan Comments:         Anesthesia Quick Evaluation

## 2012-06-05 NOTE — Anesthesia Postprocedure Evaluation (Signed)
  Anesthesia Post-op Note  Patient: [name]  Procedure(s) Performed: Procedure(s) (LRB) with comments: RELEASE DORSAL COMPARTMENT (DEQUERVAIN) (Left) - Excision of mixoid cyst also CARPAL TUNNEL RELEASE (Left)  Patient Location: PACU  Anesthesia Type:General  Level of Consciousness: awake  Airway and Oxygen Therapy: Patient Spontanous Breathing  Post-op Pain: mild  Post-op Assessment: Post-op Vital signs reviewed  Post-op Vital Signs: stable  Complications: No apparent anesthesia complications

## 2012-06-05 NOTE — Transfer of Care (Signed)
Immediate Anesthesia Transfer of Care Note  Patient: [name]  Procedure(s) Performed: Procedure(s) (LRB) with comments: RELEASE DORSAL COMPARTMENT (DEQUERVAIN) (Left) - Excision of mixoid cyst also CARPAL TUNNEL RELEASE (Left)  Patient Location: PACU  Anesthesia Type:General  Level of Consciousness: sedated and patient cooperative  Airway & Oxygen Therapy: Patient Spontanous Breathing  Post-op Assessment: Report given to PACU RN and Post -op Vital signs reviewed and stable  Post vital signs: Reviewed and stable  Complications: No apparent anesthesia complications

## 2012-06-05 NOTE — Anesthesia Procedure Notes (Signed)
Procedure Name: LMA Insertion Date/Time: 06/05/2012 8:41 AM Performed by: Gar Gibbon Pre-anesthesia Checklist: Patient identified, Emergency Drugs available, Suction available and Patient being monitored Patient Re-evaluated:Patient Re-evaluated prior to inductionOxygen Delivery Method: Circle System Utilized Preoxygenation: Pre-oxygenation with 100% oxygen Intubation Type: IV induction Ventilation: Mask ventilation without difficulty LMA: LMA inserted LMA Size: 4.0 Number of attempts: 1 Airway Equipment and Method: bite block Placement Confirmation: positive ETCO2 Tube secured with: Tape Dental Injury: Teeth and Oropharynx as per pre-operative assessment

## 2012-06-06 ENCOUNTER — Encounter (HOSPITAL_BASED_OUTPATIENT_CLINIC_OR_DEPARTMENT_OTHER): Payer: Self-pay | Admitting: Orthopedic Surgery

## 2012-06-08 NOTE — Op Note (Signed)
NAMEPRICIE, MCLERRAN                 ACCOUNT NO.:  [id number]  MEDICAL RECORD NO.:  [id number]  LOCATION:                               FACILITY:  [facility]  PHYSICIAN:  Katy Fitch. Christon Parada, M.D.      DATE OF BIRTH:  DATE OF PROCEDURE:  06/05/2012 DATE OF DISCHARGE:  06/05/2012                              OPERATIVE REPORT   POSTOPERATIVE DIAGNOSES:  Entrapment neuropathy, median nerve, left carpal tunnel and painful first dorsal compartment stenosing tenosynovitis with myxoid cyst on extensor retinaculum.  POSTOPERATIVE DIAGNOSES:  Entrapment neuropathy, median nerve, left carpal tunnel and painful first dorsal compartment stenosing tenosynovitis with myxoid cyst on extensor retinaculum.  OPERATION: 1. Release of left transverse carpal ligament. 2. Resection of myxoid cyst from left first dorsal compartment and     release of first dorsal compartment.  OPERATING SURGEON:  Katy Fitch. Aidin Doane, MD  ASSISTANT:  Marveen Reeks Dasnoit, PA-C  ANESTHESIA:  General by LMA.  SUPERVISING ANESTHESIOLOGIST:  Burna Forts, MD  INDICATIONS:  [name] is a 61 year old well acquainted with our practice.  She has a history of pain at the radial aspect of her left wrist and bilateral hand numbness.  She presented for evaluation of multiple orthopedic predicaments.  During her exam, she was noted to have bilateral carpal tunnel syndrome confirmed by electrodiagnostic studies, stenosing tenosynovitis of the left first dorsal compartment with a myxoid cyst overlying the extensor retinaculum.  A loose body on the medial aspect of her left olecranon near the cubital groove and a calcified mass in the subcutaneous region of her left brachium posteriorly.  We had detailed discussion of management choices for each problem.  She decided that she would tolerate her thumb CMC arthritis at this time but would like to have her first dorsal compartment stenosing tenosynovitis relieved and her carpal tunnel  syndrome symptoms relieved. We made arrangements for release of her left transverse carpal ligament and release of her left first dorsal compartment with myxoid cyst resection at this time.  Preoperatively, she was interviewed by Dr. Jacklynn Bue of the Anesthesia Service.  After detailed informed consent, she chose general anesthesia by LMA technique.  Preoperatively, she was interviewed in the holding area.  The proper surgical site was identified and marked with a marking pen.  Questions were invited and answered in detail.  PROCEDURE:  Ahlea Bransky was brought to room 2 of the [facility] and placed in supine position on the operating table.  Following induction of general anesthesia by LMA technique under Dr. Marlane Mingle direct supervision, left arm and hand were prepped with Betadine soap and solution, sterilely draped.  A pneumatic tourniquet was applied to the proximal left brachium.  Following exsanguination of left arm with Esmarch bandage, the arterial tourniquet was inflated to 220 mmHg.  Following routine surgical time-out, procedure commenced with a short oblique incision directly over the palpably thickened wrist first dorsal compartment and myxoid cyst.  Subcutaneous tissues were carefully divided, taking care to place blunt Ragnell retractors exposing the compartment.  A myxoid cyst measuring 4 mm diameter was identified and resected with a micro rongeur.  The compartment was then split with scissors  and 3 tendons noted.  There was a minimal septum between the extensor pollicis brevis and abductor pollicis longus tendons.  This was removed with scissors.  Thereafter free range of motion of the wrist, thumb, and thumb MP joint was recovered.  The wound was then repaired with intradermal 3-0 Prolene suture and Steri-Strips.  A 2% lidocaine was infiltrated for postoperative analgesia.  Attention was then directed to the proximal left palm.  A short  incision was fashioned in line of the ring finger.  Subcutaneous tissues were carefully divided revealing palmar fascia.  This split longitudinally to the common sensory branch of the median nerve.  These were followed back to the median nerve proper and a Penfield 4 elevator was used to create a pathway superficial to the nerve deep to the transverse carpal ligament.  The lid was then released with scissors along its ulnar border extending into the distal forearm.  This widely opened the carpal canal.  The ulnar bursa was moderately fibrotic and it did appear to have a few areas of possible crystalline deposition which could represent pseudogout or even possibly gout.  The wound was examined for bleeding points and subsequently repaired with intradermal 3-0 Prolene suture.  A compressive dressing was applied with a volar plaster splint, maintaining the wrist in 15 degrees of dorsiflexion.  For aftercare, Ms. Lovel is provided prescription for Vicodin 5 mg 1 tablet p.o. q.4-6 hours p.r.n. pain, 24 tablets without refill.     Katy Fitch Olita Takeshita, M.D.     RVS/MEDQ  D:  06/05/2012  T:  06/06/2012  Job:  [id number]

## 2012-06-13 ENCOUNTER — Ambulatory Visit: Payer: 59

## 2012-06-25 ENCOUNTER — Emergency Department (HOSPITAL_COMMUNITY): Payer: 59

## 2012-06-25 ENCOUNTER — Emergency Department (HOSPITAL_COMMUNITY)
Admission: EM | Admit: 2012-06-25 | Discharge: 2012-06-25 | Disposition: A | Discharge status: Home / Self Care | Payer: 59 | Attending: Emergency Medicine | Admitting: Emergency Medicine

## 2012-06-25 ENCOUNTER — Encounter (HOSPITAL_COMMUNITY): Payer: Self-pay | Admitting: *Deleted

## 2012-06-25 DIAGNOSIS — K219 Gastro-esophageal reflux disease without esophagitis: Secondary | ICD-10-CM | POA: Insufficient documentation

## 2012-06-25 DIAGNOSIS — Z79899 Other long term (current) drug therapy: Secondary | ICD-10-CM | POA: Insufficient documentation

## 2012-06-25 DIAGNOSIS — Z7982 Long term (current) use of aspirin: Secondary | ICD-10-CM | POA: Insufficient documentation

## 2012-06-25 DIAGNOSIS — E039 Hypothyroidism, unspecified: Secondary | ICD-10-CM | POA: Insufficient documentation

## 2012-06-25 DIAGNOSIS — C92Z0 Other myeloid leukemia not having achieved remission: Secondary | ICD-10-CM | POA: Insufficient documentation

## 2012-06-25 DIAGNOSIS — R739 Hyperglycemia, unspecified: Secondary | ICD-10-CM

## 2012-06-25 DIAGNOSIS — F3289 Other specified depressive episodes: Secondary | ICD-10-CM | POA: Insufficient documentation

## 2012-06-25 DIAGNOSIS — R6883 Chills (without fever): Secondary | ICD-10-CM | POA: Insufficient documentation

## 2012-06-25 DIAGNOSIS — Z794 Long term (current) use of insulin: Secondary | ICD-10-CM | POA: Insufficient documentation

## 2012-06-25 DIAGNOSIS — R1012 Left upper quadrant pain: Secondary | ICD-10-CM | POA: Insufficient documentation

## 2012-06-25 DIAGNOSIS — F329 Major depressive disorder, single episode, unspecified: Secondary | ICD-10-CM | POA: Insufficient documentation

## 2012-06-25 DIAGNOSIS — Z87440 Personal history of urinary (tract) infections: Secondary | ICD-10-CM | POA: Insufficient documentation

## 2012-06-25 DIAGNOSIS — M129 Arthropathy, unspecified: Secondary | ICD-10-CM | POA: Insufficient documentation

## 2012-06-25 DIAGNOSIS — IMO0001 Reserved for inherently not codable concepts without codable children: Secondary | ICD-10-CM | POA: Insufficient documentation

## 2012-06-25 DIAGNOSIS — I10 Essential (primary) hypertension: Secondary | ICD-10-CM | POA: Insufficient documentation

## 2012-06-25 LAB — LIPASE, BLOOD: Lipase: 13 U/L (ref 11–59)

## 2012-06-25 LAB — COMPREHENSIVE METABOLIC PANEL
ALT: 12 U/L (ref 0–35)
Alkaline Phosphatase: 218 U/L — ABNORMAL HIGH (ref 39–117)
BUN: 22 mg/dL (ref 6–23)
CO2: 18 mEq/L — ABNORMAL LOW (ref 19–32)
Chloride: 89 mEq/L — ABNORMAL LOW (ref 96–112)
GFR calc Af Amer: 60 mL/min — ABNORMAL LOW (ref >90)
GFR calc non Af Amer: 52 mL/min — ABNORMAL LOW (ref >90)
Glucose, Bld: 445 mg/dL — ABNORMAL HIGH (ref 70–99)
Potassium: 4.4 mEq/L (ref 3.5–5.1)
Sodium: 129 mEq/L — ABNORMAL LOW (ref 135–145)
Total Bilirubin: 0.4 mg/dL (ref 0.3–1.2)
Total Protein: 8.3 g/dL (ref 6.0–8.3)

## 2012-06-25 LAB — GLUCOSE, CAPILLARY: Glucose-Capillary: 359 mg/dL — ABNORMAL HIGH (ref 70–99)

## 2012-06-25 LAB — CBC WITH DIFFERENTIAL/PLATELET
Basophils Relative: 1 % (ref 0–1)
Eosinophils Absolute: 0.1 10*3/uL (ref 0.0–0.7)
Eosinophils Relative: 1 % (ref 0–5)
HCT: 41.5 % (ref 36.0–46.0)
Hemoglobin: 14.4 g/dL (ref 12.0–15.0)
Lymphocytes Relative: 22 % (ref 12–46)
MCHC: 34.7 g/dL (ref 30.0–36.0)
Neutro Abs: 8.6 10*3/uL — ABNORMAL HIGH (ref 1.7–7.7)
RBC: 5.65 MIL/uL — ABNORMAL HIGH (ref 3.87–5.11)

## 2012-06-25 LAB — URINALYSIS, ROUTINE W REFLEX MICROSCOPIC
Glucose, UA: >1000 mg/dL — AB
Hgb urine dipstick: NEGATIVE
Ketones, ur: >80 mg/dL — AB
Leukocytes, UA: NEGATIVE
Protein, ur: NEGATIVE mg/dL
pH: 5 (ref 5.0–8.0)

## 2012-06-25 LAB — BASIC METABOLIC PANEL
BUN: 18 mg/dL (ref 6–23)
CO2: 20 mEq/L (ref 19–32)
Calcium: 8.9 mg/dL (ref 8.4–10.5)
Chloride: 93 mEq/L — ABNORMAL LOW (ref 96–112)
Creatinine, Ser: 0.91 mg/dL (ref 0.50–1.10)

## 2012-06-25 LAB — URINE MICROSCOPIC-ADD ON

## 2012-06-25 LAB — BLOOD GAS, VENOUS
Acid-base deficit: 4.6 mmol/L — ABNORMAL HIGH (ref 0.0–2.0)
Bicarbonate: 20.1 mEq/L (ref 20.0–24.0)
Patient temperature: 98.6
TCO2: 18.4 mmol/L (ref 0–100)

## 2012-06-25 LAB — TROPONIN I: Troponin I: <0.3 ng/mL (ref <0.30)

## 2012-06-25 MED ORDER — INSULIN LISPRO 100 UNIT/ML ~~LOC~~ SOLN: SUBCUTANEOUS | Status: DC

## 2012-06-25 MED ORDER — INSULIN ASPART 100 UNIT/ML ~~LOC~~ SOLN
10.0000 [IU] | Freq: Once | SUBCUTANEOUS | Status: AC
  Administered 2012-06-25: 10 [IU] via SUBCUTANEOUS
  Filled 2012-06-25: qty 1

## 2012-06-25 MED ORDER — OXYCODONE-ACETAMINOPHEN 5-325 MG PO TABS
1.0000 | ORAL_TABLET | Freq: Once | ORAL | Status: AC
  Administered 2012-06-25: 1 via ORAL
  Filled 2012-06-25: qty 1

## 2012-06-25 MED ORDER — SODIUM CHLORIDE 0.9 % IV BOLUS (SEPSIS)
1000.0000 mL | Freq: Once | INTRAVENOUS | Status: AC
  Administered 2012-06-25: 1000 mL via INTRAVENOUS

## 2012-06-25 MED ORDER — IOHEXOL 300 MG/ML  SOLN
100.0000 mL | Freq: Once | INTRAMUSCULAR | Status: AC | PRN
  Administered 2012-06-25: 100 mL via INTRAVENOUS

## 2012-06-25 MED ORDER — INSULIN GLARGINE 100 UNIT/ML ~~LOC~~ SOLN: 30.0000 [IU] | Freq: Every day | SUBCUTANEOUS | Status: DC

## 2012-06-25 MED ORDER — INSULIN REGULAR HUMAN 100 UNIT/ML IJ SOLN: 10.0000 [IU] | Freq: Once | INTRAMUSCULAR | Status: DC

## 2012-06-25 MED ORDER — PANTOPRAZOLE SODIUM 40 MG PO TBEC
40.0000 mg | DELAYED_RELEASE_TABLET | Freq: Once | ORAL | Status: AC
  Administered 2012-06-25: 40 mg via ORAL
  Filled 2012-06-25: qty 1

## 2012-06-25 NOTE — ED Notes (Signed)
Pt states she is here for high blood sugar, called her PCP and was told to come here, pt states has taken her CBG twice today and the second time it read high, Pt states she has been taking her Humalog like scheduled but has been out of her Lantus since Friday, states her doctor won't refill it until she see's him. Pt states her mouth feels dry and having upper abdominal and back pain.

## 2012-06-25 NOTE — ED Notes (Signed)
Patient transported to CT 

## 2012-06-25 NOTE — ED Provider Notes (Addendum)
History     CSN: [id number]  Arrival date & time 06/25/12  4098   First MD Initiated Contact with Patient 06/25/12 1011      No chief complaint on file.   (Consider location/radiation/quality/duration/timing/severity/associated sxs/prior treatment) HPI Patient with iddm weakness and bs elevated at high.  Patient states she became nauseated on Saturday with epigastric pain off and on "all week" worse yesterday and today, but did not wake up from sleep.  Pain radiates to left upper quadrant.  Patient ate peanut butter sandwich today.  No fever, some chills.   PMD [name] Past Medical History  Diagnosis Date  . Diabetes mellitus   . Hypertension   . Hypothyroidism   . PONV (postoperative nausea and vomiting)   . Blood transfusion   . UTI (lower urinary tract infection)   . GERD (gastroesophageal reflux disease)     occ  . Fibromyalgia   . Arthritis   . Depression     Past Surgical History  Procedure Date  . Cholecystectomy   . Appendectomy   . Orthopedic surgeries     multiple  . Cesarean section     x 2  . Knee arthroplasty 09    lft partial  . Knee arthroplasty     rt  . Shoulder open rotator cuff repair     rt and lft  . Breast surgery     breast reduction  . Back surgery 96    Jan 2013 - [facility]  . Dorsal compartment release 06/05/2012    Procedure: RELEASE DORSAL COMPARTMENT (DEQUERVAIN);  Surgeon: Wyn Forster., MD;  Location: [facility];  Service: Orthopedics;  Laterality: Left;  Excision of mixoid cyst also  . Carpal tunnel release 06/05/2012    Procedure: CARPAL TUNNEL RELEASE;  Surgeon: Wyn Forster., MD;  Location: [facility];  Service: Orthopedics;  Laterality: Left;    Family History  Problem Relation Age of Onset  . Diabetes type II    . Hypothyroidism      History  Substance Use Topics  . Smoking status: Never Smoker   . Smokeless tobacco: Never Used  . Alcohol Use: No    OB  History    Grav Para Term Preterm Abortions TAB SAB Ect Mult Living                  Review of Systems  Constitutional: Positive for chills and appetite change. Negative for fever, activity change and unexpected weight change.  HENT: Negative for sore throat, rhinorrhea, neck pain, neck stiffness and sinus pressure.   Eyes: Negative for visual disturbance.  Respiratory: Negative for cough and shortness of breath.   Cardiovascular: Negative for chest pain and leg swelling.  Gastrointestinal: Positive for abdominal pain. Negative for vomiting, diarrhea and blood in stool.  Genitourinary: Negative for dysuria, urgency, frequency, vaginal discharge and difficulty urinating.  Musculoskeletal: Negative for myalgias, arthralgias and gait problem.  Skin: Negative for color change and rash.  Neurological: Negative for weakness, light-headedness and headaches.  Hematological: Does not bruise/bleed easily.  Psychiatric/Behavioral: Negative for dysphoric mood.    Allergies  Codeine and Penicillins  Home Medications   Current Outpatient Rx  Name  Route  Sig  Dispense  Refill  . ASPIRIN 81 MG PO TABS   Oral   Take 81 mg by mouth daily.         Marland Kitchen CALCIUM 600 + D PO   Oral  Take 1 tablet by mouth daily.          . COLESEVELAM HCL 625 MG PO TABS   Oral   Take 1,250 mg by mouth 2 (two) times daily with a meal.          . CYCLOBENZAPRINE HCL 10 MG PO TABS   Oral   Take 10 mg by mouth 3 (three) times daily as needed. For muscle pain         . ESOMEPRAZOLE MAGNESIUM 20 MG PO CPDR   Oral   Take 20 mg by mouth as needed.         Marland Kitchen GABAPENTIN 300 MG PO CAPS   Oral   Take 300 mg by mouth 3 (three) times daily.         Marland Kitchen HYDROCODONE-ACETAMINOPHEN 5-325 MG PO TABS      1 or 2 tabs every 4 hours as needed for pain   24 tablet   0   . INSULIN GLARGINE 100 UNIT/ML [place] SOLN   Subcutaneous   Inject 30 Units into the skin at bedtime.   10 mL      . INSULIN LISPRO (HUMAN)  100 UNIT/ML [place] SOLN      Take 10 u meal coverage plus SSI: If CBG < 70: Drink juice; CBG 70-120: 0 u; CBG 121-150: 3 u; CBG 151-200: 4 u; CBG 201-250: 7 u; CBG 251-300: 11 u;CBG 301-350: 15 u; CBG 351-400: 20 u; CBG > 400: call MD    10 mL        Take 10 u meal coverage plus SSI: If CBG < 70: Dri ...   . LEVOTHYROXINE SODIUM 125 MCG PO TABS   Oral   Take 125 mcg by mouth daily.          Marland Kitchen LOSARTAN POTASSIUM-HCTZ 100-12.5 MG PO TABS   Oral   Take 1 tablet by mouth daily.         Marland Kitchen METFORMIN HCL 1000 MG PO TABS   Oral   Take 1,000 mg by mouth 2 (two) times daily with a meal.         . METOPROLOL SUCCINATE ER 100 MG PO TB24   Oral   Take 100 mg by mouth daily.           . ADULT MULTIVITAMIN W/MINERALS CH   Oral   Take 1 tablet by mouth daily.           Marland Kitchen PROMETHAZINE HCL 25 MG RE SUPP   Rectal   Place 25 mg rectally every 6 (six) hours as needed. For nausea.          Marland Kitchen TEMAZEPAM 30 MG PO CAPS   Oral   Take 30 mg by mouth at bedtime as needed. For sleep         . VENLAFAXINE HCL ER 37.5 MG PO CP24   Oral   Take 37.5 mg by mouth daily.           BP 141/90  Pulse 117  Temp 98 F (36.7 C) (Oral)  Resp 20  SpO2 98%  Physical Exam  Nursing note and vitals reviewed. Constitutional: She is oriented to person, place, and time. She appears well-developed and well-nourished.  HENT:  Head: Normocephalic and atraumatic.  Eyes: Conjunctivae normal are normal. Pupils are equal, round, and reactive to light.  Neck: Normal range of motion. Neck supple.  Cardiovascular: Regular rhythm.  Tachycardia present.   Pulmonary/Chest: Effort normal and breath sounds  normal.  Abdominal: Soft. There is tenderness.       Mild diffuse upper abdominal  Musculoskeletal: Normal range of motion.  Neurological: She is alert and oriented to person, place, and time.  Skin: Skin is warm and dry.  Psychiatric: She has a normal mood and affect.    ED Course  Procedures  (including critical care time)  Labs Reviewed  GLUCOSE, CAPILLARY - Abnormal; Notable for the following:    Glucose-Capillary 437 (*)     All other components within normal limits  CBC WITH DIFFERENTIAL  COMPREHENSIVE METABOLIC PANEL   No results found.   No diagnosis found.   Date: 06/25/2012  Rate: 94  Rhythm: normal sinus rhythm  QRS Axis: left  Intervals: normal  ST/T Wave abnormalities: normal  Conduction Disutrbances:none  Narrative Interpretation:   Old EKG Reviewed: unchanged    MDM  Patient states she has been out of lantus and wants to know if this could cause bs to be elevated.   Patient with bs decreasing and taking po well here.  Plan recheck bmet and reassess.    Repeat bmet with co2 increased and glucose decreased.  Patient with epigastric discomfort resolved with protonix and no ekg changes or troponin elevation.  Patient has had symptoms for several days.  Patient's lantus and humalog will be refilled and she is advised to recheck with her doctor in 1-2 days.   Hilario Quarry, MD 06/25/12 1620  Hilario Quarry, MD 08/04/12 [phone]

## 2012-06-25 NOTE — ED Notes (Signed)
Pt states the lower back pain is from a fall couple weeks ago and had back surgery April the 9th, states had xrays after fall and everything was ok. Pt states also she's been nauseous, denies vomiting since yesterday, has not taken any of her medications today except humalog.

## 2012-06-26 ENCOUNTER — Encounter (HOSPITAL_COMMUNITY): Payer: Self-pay | Admitting: Emergency Medicine

## 2012-06-26 ENCOUNTER — Inpatient Hospital Stay (HOSPITAL_COMMUNITY)
Admission: EM | Admit: 2012-06-26 | Discharge: 2012-06-28 | DRG: 639 | Disposition: A | Discharge status: Home / Self Care | Payer: 59 | Attending: Internal Medicine | Admitting: Internal Medicine

## 2012-06-26 DIAGNOSIS — I10 Essential (primary) hypertension: Secondary | ICD-10-CM | POA: Diagnosis present

## 2012-06-26 DIAGNOSIS — IMO0001 Reserved for inherently not codable concepts without codable children: Secondary | ICD-10-CM | POA: Diagnosis present

## 2012-06-26 DIAGNOSIS — E1110 Type 2 diabetes mellitus with ketoacidosis without coma: Secondary | ICD-10-CM | POA: Diagnosis present

## 2012-06-26 DIAGNOSIS — E860 Dehydration: Secondary | ICD-10-CM

## 2012-06-26 DIAGNOSIS — Z79899 Other long term (current) drug therapy: Secondary | ICD-10-CM

## 2012-06-26 DIAGNOSIS — E1010 Type 1 diabetes mellitus with ketoacidosis without coma: Secondary | ICD-10-CM

## 2012-06-26 DIAGNOSIS — E039 Hypothyroidism, unspecified: Secondary | ICD-10-CM | POA: Diagnosis present

## 2012-06-26 DIAGNOSIS — F329 Major depressive disorder, single episode, unspecified: Secondary | ICD-10-CM | POA: Diagnosis present

## 2012-06-26 DIAGNOSIS — E1310 Other specified diabetes mellitus with ketoacidosis without coma: Principal | ICD-10-CM | POA: Diagnosis present

## 2012-06-26 DIAGNOSIS — D72829 Elevated white blood cell count, unspecified: Secondary | ICD-10-CM | POA: Diagnosis present

## 2012-06-26 DIAGNOSIS — E119 Type 2 diabetes mellitus without complications: Secondary | ICD-10-CM | POA: Diagnosis present

## 2012-06-26 DIAGNOSIS — Z794 Long term (current) use of insulin: Secondary | ICD-10-CM

## 2012-06-26 DIAGNOSIS — E7800 Pure hypercholesterolemia, unspecified: Secondary | ICD-10-CM | POA: Diagnosis present

## 2012-06-26 DIAGNOSIS — R1013 Epigastric pain: Secondary | ICD-10-CM

## 2012-06-26 DIAGNOSIS — K219 Gastro-esophageal reflux disease without esophagitis: Secondary | ICD-10-CM | POA: Diagnosis present

## 2012-06-26 DIAGNOSIS — F3289 Other specified depressive episodes: Secondary | ICD-10-CM | POA: Diagnosis present

## 2012-06-26 LAB — COMPREHENSIVE METABOLIC PANEL
BUN: 18 mg/dL (ref 6–23)
CO2: 8 mEq/L — CL (ref 19–32)
Chloride: 93 mEq/L — ABNORMAL LOW (ref 96–112)
Creatinine, Ser: 1.02 mg/dL (ref 0.50–1.10)
GFR calc non Af Amer: 58 mL/min — ABNORMAL LOW (ref >90)
Glucose, Bld: 410 mg/dL — ABNORMAL HIGH (ref 70–99)
Total Bilirubin: 0.2 mg/dL — ABNORMAL LOW (ref 0.3–1.2)

## 2012-06-26 LAB — CBC WITH DIFFERENTIAL/PLATELET
HCT: 42 % (ref 36.0–46.0)
Hemoglobin: 14.1 g/dL (ref 12.0–15.0)
Lymphocytes Relative: 12 % (ref 12–46)
MCHC: 33.6 g/dL (ref 30.0–36.0)
Monocytes Absolute: 0.3 10*3/uL (ref 0.1–1.0)
Monocytes Relative: 3 % (ref 3–12)
Neutro Abs: 9.2 10*3/uL — ABNORMAL HIGH (ref 1.7–7.7)
WBC: 10.8 10*3/uL — ABNORMAL HIGH (ref 4.0–10.5)

## 2012-06-26 LAB — GLUCOSE, CAPILLARY
Glucose-Capillary: 295 mg/dL — ABNORMAL HIGH (ref 70–99)
Glucose-Capillary: 172 mg/dL — ABNORMAL HIGH (ref 70–99)

## 2012-06-26 LAB — URINALYSIS, ROUTINE W REFLEX MICROSCOPIC
Glucose, UA: >1000 mg/dL — AB
Ketones, ur: >80 mg/dL — AB
Leukocytes, UA: NEGATIVE
Nitrite: NEGATIVE
Protein, ur: 30 mg/dL — AB

## 2012-06-26 LAB — LIPASE, BLOOD: Lipase: 14 U/L (ref 11–59)

## 2012-06-26 LAB — BASIC METABOLIC PANEL
CO2: 12 mEq/L — ABNORMAL LOW (ref 19–32)
Chloride: 106 mEq/L (ref 96–112)
GFR calc Af Amer: 76 mL/min — ABNORMAL LOW (ref >90)
Potassium: 4.5 mEq/L (ref 3.5–5.1)
Sodium: 137 mEq/L (ref 135–145)

## 2012-06-26 MED ORDER — SODIUM CHLORIDE 0.9 % IV SOLN
INTRAVENOUS | Status: DC
  Administered 2012-06-26: 2.9 [IU]/h via INTRAVENOUS
  Filled 2012-06-26: qty 1

## 2012-06-26 MED ORDER — MORPHINE SULFATE 4 MG/ML IJ SOLN
4.0000 mg | Freq: Once | INTRAMUSCULAR | Status: AC
  Administered 2012-06-26: 4 mg via INTRAVENOUS
  Filled 2012-06-26: qty 1

## 2012-06-26 MED ORDER — ONDANSETRON HCL 4 MG/2ML IJ SOLN
4.0000 mg | Freq: Once | INTRAMUSCULAR | Status: AC
  Administered 2012-06-26: 4 mg via INTRAVENOUS
  Filled 2012-06-26: qty 2

## 2012-06-26 MED ORDER — ENOXAPARIN SODIUM 40 MG/0.4ML ~~LOC~~ SOLN
40.0000 mg | SUBCUTANEOUS | Status: DC
Start: 2012-06-26 — End: 2012-06-28
  Administered 2012-06-26 – 2012-06-27 (×2): 40 mg via SUBCUTANEOUS
  Filled 2012-06-26 (×3): qty 0.4

## 2012-06-26 MED ORDER — DEXTROSE-NACL 5-0.45 % IV SOLN
INTRAVENOUS | Status: DC
  Administered 2012-06-26: 1000 mL via INTRAVENOUS

## 2012-06-26 MED ORDER — INSULIN REGULAR BOLUS VIA INFUSION
0.0000 [IU] | Freq: Three times a day (TID) | INTRAVENOUS | Status: DC
  Filled 2012-06-26: qty 10

## 2012-06-26 MED ORDER — SODIUM CHLORIDE 0.9 % IV SOLN
1000.0000 mL | INTRAVENOUS | Status: DC
  Administered 2012-06-26 (×2): 1000 mL via INTRAVENOUS

## 2012-06-26 MED ORDER — FLEET ENEMA 7-19 GM/118ML RE ENEM: 1.0000 | ENEMA | Freq: Every day | RECTAL | Status: DC | PRN

## 2012-06-26 MED ORDER — SODIUM CHLORIDE 0.9 % IV SOLN
INTRAVENOUS | Status: AC
  Filled 2012-06-26: qty 1

## 2012-06-26 MED ORDER — PANTOPRAZOLE SODIUM 40 MG IV SOLR
40.0000 mg | Freq: Once | INTRAVENOUS | Status: AC
  Administered 2012-06-26: 40 mg via INTRAVENOUS
  Filled 2012-06-26: qty 40

## 2012-06-26 MED ORDER — SODIUM CHLORIDE 0.9 % IV BOLUS (SEPSIS)
1000.0000 mL | Freq: Once | INTRAVENOUS | Status: AC
  Administered 2012-06-26: 1000 mL via INTRAVENOUS

## 2012-06-26 MED ORDER — MORPHINE SULFATE 2 MG/ML IJ SOLN
2.0000 mg | INTRAMUSCULAR | Status: DC | PRN
  Administered 2012-06-26 – 2012-06-28 (×4): 2 mg via INTRAVENOUS
  Filled 2012-06-26 (×4): qty 1

## 2012-06-26 MED ORDER — INSULIN REGULAR HUMAN 100 UNIT/ML IJ SOLN: 10.0000 [IU] | Freq: Once | INTRAMUSCULAR | Status: DC

## 2012-06-26 MED ORDER — ONDANSETRON HCL 4 MG/2ML IJ SOLN
4.0000 mg | Freq: Four times a day (QID) | INTRAMUSCULAR | Status: DC | PRN
  Administered 2012-06-27: 4 mg via INTRAVENOUS
  Filled 2012-06-26: qty 2

## 2012-06-26 MED ORDER — CHLORHEXIDINE GLUCONATE 0.12 % MT SOLN
15.0000 mL | Freq: Two times a day (BID) | OROMUCOSAL | Status: DC
  Administered 2012-06-27: 15 mL via OROMUCOSAL
  Filled 2012-06-26: qty 15

## 2012-06-26 MED ORDER — BIOTENE DRY MOUTH MT LIQD
15.0000 mL | Freq: Two times a day (BID) | OROMUCOSAL | Status: DC
  Administered 2012-06-27: 15 mL via OROMUCOSAL

## 2012-06-26 MED ORDER — DEXTROSE 50 % IV SOLN: 25.0000 mL | INTRAVENOUS | Status: DC | PRN

## 2012-06-26 MED ORDER — POTASSIUM CHLORIDE 10 MEQ/100ML IV SOLN
10.0000 meq | INTRAVENOUS | Status: AC
  Administered 2012-06-27 (×4): 10 meq via INTRAVENOUS
  Filled 2012-06-26 (×4): qty 100

## 2012-06-26 MED ORDER — INSULIN ASPART 100 UNIT/ML ~~LOC~~ SOLN
10.0000 [IU] | Freq: Once | SUBCUTANEOUS | Status: AC
  Administered 2012-06-26: 10 [IU] via SUBCUTANEOUS
  Filled 2012-06-26: qty 10

## 2012-06-26 MED ORDER — SODIUM CHLORIDE 0.9 % IV SOLN
INTRAVENOUS | Status: DC
  Administered 2012-06-26: 1000 mL via INTRAVENOUS

## 2012-06-26 MED ORDER — ASPIRIN 300 MG RE SUPP
300.0000 mg | Freq: Every day | RECTAL | Status: DC
  Filled 2012-06-26 (×3): qty 1

## 2012-06-26 NOTE — H&P (Addendum)
[facility] admit Note  Date: 06/26/2012               Patient Name:  [name] MRN: [id number]  DOB: 02-26-1951 Age / Sex: 61 y.o., female   PCP: [name], MD                   Chief Complaint: Nausea and vomiting and abdominal pain  History of Present Illness: Patient is a 61 y.o. female with a PMHx of type 2 diabetes since 1999, who presents to [facility] for evaluation of abdominal pain. Patient was seen in the ER yesterday for nausea and abdominal pain but was discharged after adequate rehydration. Patient said that she saw her primary care physician for a followup visit. She started having nausea and vomiting at the doctor's office there she was given an injection of Phenergan.Patient vomited about 3 times in his office.  patient had another 6-7 episodes prior to showing up to the ER . Her CBG was found to be more than 500 in the office. The vomitus contained no food particles has she hasn't been able to keep anything down. Vomitus is described as just dry heaving and some ginger ale she is having a lot of burping. Nausea and vomiting was associated with abdominal pain which has been present actually since Sunday morning that this 3 days prior to admission. Abdominal pain is described as present in the epigastric region, 4/10 at the time of history taking, radiating to the left side and to the back, no exacerbating factors, relieved with morphine patient denies any fever, cough, headaches, sick contacts, palpitations.  patient does have constipation and admits that she has not had a bowel movement since last 5 days.  Patient is fairly active and is able to walk without any chest pain for several miles.    Current Outpatient Medications:  Allergies: Allergies  Allergen Reactions  . Lipitor (Atorvastatin) Itching  . Lunesta (Eszopiclone) Other (See Comments)    dizzy  . Ambien (Zolpidem Tartrate) Other (See Comments)    Up sleep walking and eating  . Codeine Nausea  And Vomiting  . Crestor (Rosuvastatin) Other (See Comments)    Aching all over  . Penicillins Rash     Past Medical History: Past Medical History  Diagnosis Date  . Diabetes mellitus   . Hypertension   . Hypothyroidism   . PONV (postoperative nausea and vomiting)   . Blood transfusion   . UTI (lower urinary tract infection)   . GERD (gastroesophageal reflux disease)     occ  . Fibromyalgia   . Arthritis   . Depression     Past Surgical History: Past Surgical History  Procedure Date  . Cholecystectomy   . Appendectomy   . Orthopedic surgeries     multiple  . Cesarean section     x 2  . Knee arthroplasty 09    lft partial  . Knee arthroplasty     rt  . Shoulder open rotator cuff repair     rt and lft  . Breast surgery     breast reduction  . Dorsal compartment release 06/05/2012    Procedure: RELEASE DORSAL COMPARTMENT (DEQUERVAIN);  Surgeon: Wyn Forster., MD;  Location: [facility];  Service: Orthopedics;  Laterality: Left;  Excision of mixoid cyst also  . Carpal tunnel release 06/05/2012    Procedure: CARPAL TUNNEL RELEASE;  Surgeon: Wyn Forster., MD;  Location: [facility];  Service: Orthopedics;  Laterality: Left;  . Back surgery ,2005    April  2013 - [facility]    Family History: Family History  Problem Relation Age of Onset  . Diabetes type II    . Hypothyroidism    . Heart attack Father     59s    Social History: History   Social History  . Marital Status: Married    Spouse Name: N/A    Number of Children: N/A  . Years of Education: N/A   Occupational History  . Not on file.   Social History Main Topics  . Smoking status: Never Smoker   . Smokeless tobacco: Never Used  . Alcohol Use: No  . Drug Use: No  . Sexually Active: Yes    Birth Control/ Protection: Post-menopausal   Other Topics Concern  . Not on file   Social History Narrative  . No narrative on file    Review of  Systems: Constitutional:  denies fever, chills, diaphoresis and fatigue.  HEENT: denies photophobia, eye pain, redness, hearing loss, ear pain, congestion, sore throat, rhinorrhea, sneezing, neck pain, neck stiffness and tinnitus.  Respiratory: denies SOB, DOE, cough, chest tightness, and wheezing.  Cardiovascular: denies chest pain, palpitations and leg swelling.  Gastrointestinal: denies diarrhea, blood in stool.  Genitourinary: denies dysuria, urgency, frequency, hematuria, flank pain and difficulty urinating.  Musculoskeletal: denies  myalgias, back pain, joint swelling, arthralgias and gait problem.   Skin: denies pallor, rash and wound.  Neurological: denies dizziness, seizures, syncope, weakness, light-headedness, numbness and headaches.   Hematological: denies adenopathy, easy bruising, personal or family bleeding history.  Psychiatric/ Behavioral: denies suicidal ideation, mood changes, confusion, nervousness, sleep disturbance and agitation.    Vital Signs: Blood pressure 170/91, pulse 123, temperature 98.2 F (36.8 C), temperature source Oral, resp. rate 21, SpO2 100.00%.  Physical Exam: General: Vital signs reviewed and noted. Well-developed, well-nourished, in no acute distress; alert, appropriate and cooperative throughout examination.  Head: Normocephalic, atraumatic.  Eyes: PERRL, EOMI, No signs of anemia or jaundince.  Nose: Mucous membranes moist, not inflammed, nonerythematous.  Throat: Oropharynx nonerythematous, no exudate appreciated.   Neck: No deformities, masses, or tenderness noted.Supple, No carotid Bruits, no JVD.  Lungs:  Normal respiratory effort. Clear to auscultation BL without crackles or wheezes.  Heart: RRR. S1 and S2 normal without gallop, murmur, or rubs.  Abdomen:  BS normoactive. Soft, Nondistended, non-tender.  No masses or organomegaly.  Extremities: No pretibial edema.  Neurologic: A&O X3, CN II - XII are grossly intact. Motor strength is 5/5 in  the all 4 extremities, Sensations intact to light touch, Cerebellar signs negative.  Skin: No visible rashes, scars.   Lab results: Basic Metabolic Panel:  Basename 06/26/12 1600 06/25/12 1456  NA 132* 129*  K 5.7* 3.8  CL 93* 93*  CO2 8* 20  GLUCOSE 410* 280*  BUN 18 18  CREATININE 1.02 0.91  CALCIUM 10.4 8.9  MG -- --  PHOS -- --   Liver Function Tests:  Basename 06/26/12 1600 06/25/12 1015  AST 16 14  ALT 13 12  ALKPHOS 206* 218*  BILITOT 0.2* 0.4  PROT 8.4* 8.3  ALBUMIN 4.1 4.2    Basename 06/26/12 1600 06/25/12 1015  LIPASE 14 13  AMYLASE -- --   CBC:  Basename 06/26/12 1600 06/25/12 1015  WBC 10.8* 11.9*  NEUTROABS 9.2* 8.6*  HGB 14.1 14.4  HCT 42.0 41.5  MCV 75.8* 73.5*  PLT 429* 460*   Cardiac Enzymes:  Basename 06/25/12 1456  CKTOTAL --  CKMB --  CKMBINDEX --  TROPONINI <0.30   CBG:  Basename 06/26/12 1803 06/26/12 1405 06/25/12 1437 06/25/12 1149 06/25/12 1003  GLUCAP 349* 315* 280* 359* 437*   Urinalysis:  Basename 06/26/12 1525 06/25/12 1208  COLORURINE YELLOW YELLOW  LABSPEC 1.030 1.031*  PHURINE 5.5 5.0  GLUCOSEU >1000* >1000*  HGBUR NEGATIVE NEGATIVE  BILIRUBINUR MODERATE* MODERATE*  KETONESUR >80* >80*  PROTEINUR 30* NEGATIVE  UROBILINOGEN 0.2 0.2  NITRITE NEGATIVE NEGATIVE  LEUKOCYTESUR NEGATIVE NEGATIVE   Misc. Labs: EKG : Patient has left axis deviation, there are no ST changes noted, some nonspecific T wave changes seen in inferior lead.   Imaging results:  Ct Abdomen Pelvis W Contrast  06/25/2012  *RADIOLOGY REPORT*  Clinical Data: Low back pain.  Pelvic pain.  Nausea.  CT ABDOMEN AND PELVIS WITH CONTRAST  Technique:  Multidetector CT imaging of the abdomen and pelvis was performed following the standard protocol during bolus administration of intravenous contrast.  Contrast: OMNIPAQUE IOHEXOL 300 MG/ML  SOLN  Comparison: 07/28/2011.  Findings: The lung bases are clear.  No pulmonary nodule or pleural effusion.   The heart is normal in size.  No pericardial effusion. The distal esophagus is unremarkable.  The liver demonstrates diffuse fatty infiltration more focally in the left hepatic lobe.  No focal hepatic lesions or intrahepatic ductal dilatation.  The portal and hepatic veins are patent.  The gallbladder is surgically absent.  No common bile duct dilatation. Pancreas is normal and stable.  The spleen is normal in size.  No focal lesions.  The adrenal glands are unremarkable and stable. There is a stable upper pole left renal calculus but no worrisome renal lesions or obstructing ureteral calculi.  The stomach is not well distended with contrast.  No gross abnormalities are seen.  The duodenum, small bowel and colon are unremarkable.  No inflammatory changes or mass lesions.  The appendix is surgically absent.  There are scattered colonic diverticuli and moderate stool throughout the colon.  The aorta is normal in caliber.  Scattered atherosclerotic calcifications but no focal aneurysm or dissection.  The major branch vessels are patent.  Moderate calcification again noted at the right renal artery ostia.  No mesenteric or retroperitoneal masses or adenopathy.  The uterus and ovaries are normal and stable.  No pelvic mass, adenopathy or free pelvic fluid collections.  No inguinal mass or hernia.  The bladder is normal.  Stable surgical changes involving the lumbar spine related to posterior and interbody fusion extending from L3-S1.  The L5-S1 fusion hardware has been removed since the prior study.  No solid interbody fusion changes at L3-4.  There is a mild and likely acute compression fracture involving the superior endplate of T12.  This was not present on the prior CT scan.  IMPRESSION:  1.  No acute abdominal/pelvic findings, mass lesions or lymphadenopathy. 2.  Lumbar fusion changes from L3-S1. 3.  Acute/subacute compression fracture of T12.   Original Report Authenticated By: Rudie Meyer, M.D.          Assessment & Plan:  Pt is a 61 y.o. yo female with a PMHx of type 2 diabetes who was admitted on 06/26/2012 with symptoms of abdominal pain, nausea and vomiting, which was determined to be secondary to DKA. Interventions at this time will be focused on correction of electrolyte abnormalities and looking for a cause of DKA.    1) Diabetic Ketoacidosis - The patient does have known  history of diabetes mellitus, therefore, this acute presentation likely does not represent new diabetes diagnosis. Patient with typical expected symptoms including non-bilious vomiting 10 times a day and moderate in the epigastrium and in the periumbilical area abdominal pain. Urinalysis show > 1000 glucose, (+) ketones. This acute occurrence is thought to have been precipitated by viral gastroenteritis. Patient with known cardiac disease or family history of early CAD, therefore, acute cardiac pathology is considered, but thought to be less likely. Patient otherwise denies drug abuse such as cocaine. Denies excessive alcohol abuse.  - DKA protocol after admission to step down - Aggressive IVF  - Diabetes education.   -Morning 12 lead EKG and CE X 3 to monitor for cardiac cause of DKA  2) Metabolic derangements - ABG was not performed today. BMP results showing Metabolic Acidosis with AG of 20. No fevers, change in mental status to suggest methanol or ethylene glycol toxicity. No recent new medications. No renal failure and mental status changes. - Will work towards treating #1and monitor.   3) Leukocytosis - likely secondary to stress hyperglycemia in setting of acute DKA. UA negative for evidence of UTI.  - Will monitor, and continue to monitor for signs/ symptoms of infection.   4) DVT PPX - low molecular weight heparin   Lars Mage MD Pager [phone]  06/26/2012, 7:31 PM

## 2012-06-26 NOTE — ED Provider Notes (Signed)
History     CSN: [id number]  Arrival date & time 06/26/12  1353   First MD Initiated Contact with Patient 06/26/12 1504      Chief Complaint  Patient presents with  . Abdominal Pain  . Nausea  . Emesis    (Consider location/radiation/quality/duration/timing/severity/associated sxs/prior treatment) HPI Pt P/w vomiting and upper abd pain. Seen yesterday for same complaint. CT abd neg. D/C'd home to f/u with PMD. Referred to ED from PMD office for persistent vomiting and dehydration. Pt states symptoms started on Saturday. She has had no BM since Thursday. Passing flatus. No fever or chills. Denies CP, sob, blood in vomit. States she took lantus as prescribed this AM.  Past Medical History  Diagnosis Date  . Diabetes mellitus   . Hypertension   . Hypothyroidism   . PONV (postoperative nausea and vomiting)   . Blood transfusion   . UTI (lower urinary tract infection)   . GERD (gastroesophageal reflux disease)     occ  . Fibromyalgia   . Arthritis   . Depression     Past Surgical History  Procedure Date  . Cholecystectomy   . Appendectomy   . Orthopedic surgeries     multiple  . Cesarean section     x 2  . Knee arthroplasty 09    lft partial  . Knee arthroplasty     rt  . Shoulder open rotator cuff repair     rt and lft  . Breast surgery     breast reduction  . Dorsal compartment release 06/05/2012    Procedure: RELEASE DORSAL COMPARTMENT (DEQUERVAIN);  Surgeon: Wyn Forster., MD;  Location: [facility];  Service: Orthopedics;  Laterality: Left;  Excision of mixoid cyst also  . Carpal tunnel release 06/05/2012    Procedure: CARPAL TUNNEL RELEASE;  Surgeon: Wyn Forster., MD;  Location: [facility];  Service: Orthopedics;  Laterality: Left;  . Back surgery ,2005    April  2013 - [facility] fusion@ [facility]    Family History  Problem Relation Age of Onset  . Diabetes type II    . Hypothyroidism    . Heart attack Father     34s      History  Substance Use Topics  . Smoking status: Never Smoker   . Smokeless tobacco: Never Used  . Alcohol Use: No    OB History    Grav Para Term Preterm Abortions TAB SAB Ect Mult Living                  Review of Systems  Constitutional: Negative for fever and chills.  Respiratory: Negative for shortness of breath.   Cardiovascular: Negative for chest pain.  Gastrointestinal: Positive for nausea, vomiting, abdominal pain and constipation. Negative for diarrhea, blood in stool and abdominal distention.  Genitourinary: Negative for dysuria.  Musculoskeletal: Negative for back pain.  Skin: Negative for rash and wound.  Neurological: Negative for dizziness, tremors, weakness, light-headedness and numbness.    Allergies  Lipitor; Lunesta; Ambien; Codeine; Crestor; and Penicillins  Home Medications   Current Outpatient Rx  Name  Route  Sig  Dispense  Refill  . ASPIRIN 81 MG PO TABS   Oral   Take 81 mg by mouth daily.         Marland Kitchen CALCIUM 600 + D PO   Oral   Take 1 tablet by mouth daily.          . COLESEVELAM HCL  625 MG PO TABS   Oral   Take 1,250 mg by mouth 2 (two) times daily with a meal.          . CYCLOBENZAPRINE HCL 10 MG PO TABS   Oral   Take 10 mg by mouth 3 (three) times daily as needed. For muscle pain         . ESOMEPRAZOLE MAGNESIUM 20 MG PO CPDR   Oral   Take 20 mg by mouth as needed.         Marland Kitchen GABAPENTIN 300 MG PO CAPS   Oral   Take 300 mg by mouth 3 (three) times daily.         Marland Kitchen HYDROCODONE-ACETAMINOPHEN 5-325 MG PO TABS   Oral   Take 1-2 tablets by mouth every 4 (four) hours as needed. pain         . INSULIN GLARGINE 100 UNIT/ML [place] SOLN   Subcutaneous   Inject 35 Units into the skin every morning.         . INSULIN LISPRO (HUMAN) 100 UNIT/ML [place] SOLN   Subcutaneous   Inject 12 Units into the skin 3 (three) times daily before meals. Take 10 u meal coverage plus SSI: If CBG < 70: Drink juice; CBG 70-120: 0 u; CBG 121-150:  3 u; CBG 151-200: 4 u; CBG 201-250: 7 u; CBG 251-300: 11 u;CBG 301-350: 15 u; CBG 351-400: 20 u; CBG > 400: call MD         . LEVOTHYROXINE SODIUM 125 MCG PO TABS   Oral   Take 125 mcg by mouth daily.          Marland Kitchen LOSARTAN POTASSIUM-HCTZ 100-12.5 MG PO TABS   Oral   Take 1 tablet by mouth daily.         Marland Kitchen METFORMIN HCL 1000 MG PO TABS   Oral   Take 1,000 mg by mouth 2 (two) times daily with a meal.         . METOPROLOL SUCCINATE ER 100 MG PO TB24   Oral   Take 100 mg by mouth daily.           . ADULT MULTIVITAMIN W/MINERALS CH   Oral   Take 1 tablet by mouth daily.           Marland Kitchen PROMETHAZINE HCL 25 MG RE SUPP   Rectal   Place 25 mg rectally every 6 (six) hours as needed. For nausea.          Marland Kitchen TEMAZEPAM 30 MG PO CAPS   Oral   Take 30 mg by mouth at bedtime as needed. For sleep         . VENLAFAXINE HCL ER 37.5 MG PO CP24   Oral   Take 37.5 mg by mouth daily.           BP 124/57  Pulse 128  Temp 98.2 F (36.8 C) (Oral)  Resp 19  SpO2 97%  Physical Exam  Nursing note and vitals reviewed. Constitutional: She is oriented to person, place, and time. She appears well-developed and well-nourished. No distress.  HENT:  Head: Normocephalic and atraumatic.       Dry MM  Eyes: EOM are normal. Pupils are equal, round, and reactive to light.  Neck: Normal range of motion. Neck supple.  Cardiovascular: Normal rate and regular rhythm.   Pulmonary/Chest: Effort normal and breath sounds normal. No respiratory distress. She has no wheezes. She has no rales. She exhibits tenderness.  Abdominal:  Soft. Bowel sounds are normal. She exhibits no distension and no mass. There is tenderness (TTP over epigastrum and LUQ. No rebound or guarding). There is no rebound and no guarding.  Musculoskeletal: Normal range of motion. She exhibits no edema and no tenderness.       No   Neurological: She is alert and oriented to person, place, and time. Abnormal muscle tone: lower ext  swelling or tenderness.  Skin: Skin is warm and dry. No rash noted. No erythema.  Psychiatric: She has a normal mood and affect. Her behavior is normal.    ED Course  Procedures (including critical care time)  Labs Reviewed  GLUCOSE, CAPILLARY - Abnormal; Notable for the following:    Glucose-Capillary 315 (*)     All other components within normal limits  CBC WITH DIFFERENTIAL - Abnormal; Notable for the following:    WBC 10.8 (*)     RBC 5.54 (*)     MCV 75.8 (*)     MCH 25.5 (*)     Platelets 429 (*)     Neutrophils Relative 85 (*)     Neutro Abs 9.2 (*)     All other components within normal limits  COMPREHENSIVE METABOLIC PANEL - Abnormal; Notable for the following:    Sodium 132 (*)     Potassium 5.7 (*)     Chloride 93 (*)     CO2 8 (*)     Glucose, Bld 410 (*)     Total Protein 8.4 (*)     Alkaline Phosphatase 206 (*)     Total Bilirubin 0.2 (*)     GFR calc non Af Amer 58 (*)     GFR calc Af Amer 67 (*)     All other components within normal limits  URINALYSIS, ROUTINE W REFLEX MICROSCOPIC - Abnormal; Notable for the following:    Glucose, UA >1000 (*)     Bilirubin Urine MODERATE (*)     Ketones, ur >80 (*)     Protein, ur 30 (*)     All other components within normal limits  GLUCOSE, CAPILLARY - Abnormal; Notable for the following:    Glucose-Capillary 349 (*)     All other components within normal limits  GLUCOSE, CAPILLARY - Abnormal; Notable for the following:    Glucose-Capillary 273 (*)     All other components within normal limits  LIPASE, BLOOD  URINE MICROSCOPIC-ADD ON   Ct Abdomen Pelvis W Contrast  06/25/2012  *RADIOLOGY REPORT*  Clinical Data: Low back pain.  Pelvic pain.  Nausea.  CT ABDOMEN AND PELVIS WITH CONTRAST  Technique:  Multidetector CT imaging of the abdomen and pelvis was performed following the standard protocol during bolus administration of intravenous contrast.  Contrast: OMNIPAQUE IOHEXOL 300 MG/ML  SOLN  Comparison:  07/28/2011.  Findings: The lung bases are clear.  No pulmonary nodule or pleural effusion.  The heart is normal in size.  No pericardial effusion. The distal esophagus is unremarkable.  The liver demonstrates diffuse fatty infiltration more focally in the left hepatic lobe.  No focal hepatic lesions or intrahepatic ductal dilatation.  The portal and hepatic veins are patent.  The gallbladder is surgically absent.  No common bile duct dilatation. Pancreas is normal and stable.  The spleen is normal in size.  No focal lesions.  The adrenal glands are unremarkable and stable. There is a stable upper pole left renal calculus but no worrisome renal lesions or obstructing ureteral calculi.  The  stomach is not well distended with contrast.  No gross abnormalities are seen.  The duodenum, small bowel and colon are unremarkable.  No inflammatory changes or mass lesions.  The appendix is surgically absent.  There are scattered colonic diverticuli and moderate stool throughout the colon.  The aorta is normal in caliber.  Scattered atherosclerotic calcifications but no focal aneurysm or dissection.  The major branch vessels are patent.  Moderate calcification again noted at the right renal artery ostia.  No mesenteric or retroperitoneal masses or adenopathy.  The uterus and ovaries are normal and stable.  No pelvic mass, adenopathy or free pelvic fluid collections.  No inguinal mass or hernia.  The bladder is normal.  Stable surgical changes involving the lumbar spine related to posterior and interbody fusion extending from L3-S1.  The L5-S1 fusion hardware has been removed since the prior study.  No solid interbody fusion changes at L3-4.  There is a mild and likely acute compression fracture involving the superior endplate of T12.  This was not present on the prior CT scan.  IMPRESSION:  1.  No acute abdominal/pelvic findings, mass lesions or lymphadenopathy. 2.  Lumbar fusion changes from L3-S1. 3.  Acute/subacute  compression fracture of T12.   Original Report Authenticated By: Rudie Meyer, M.D.      1. Diabetic ketoacidosis   2. Dehydration     CRITICAL CARE Performed by: Ranae Palms, Kainen Struckman   Total critical care time: 20  Critical care time was exclusive of separately billable procedures and treating other patients.  Critical care was necessary to treat or prevent imminent or life-threatening deterioration.  Critical care was time spent personally by me on the following activities: development of treatment plan with patient and/or surrogate as well as nursing, discussions with consultants, evaluation of patient's response to treatment, examination of patient, obtaining history from patient or surrogate, ordering and performing treatments and interventions, ordering and review of laboratory studies, ordering and review of radiographic studies, pulse oximetry and re-evaluation of patient's condition.   MDM   Discussed with [facility]. Will see pt in ED and admit       Loren Racer, MD 06/26/12 2007

## 2012-06-26 NOTE — ED Notes (Signed)
Pt c/o mid upper abd pain w/ NV.  States her sugars have been running high.  Yesterday they were in the 500s.  TOday 315.  Has been vomiting about 9 times today.  Went to the doctor and they sent her here.  Pt's HR 121 in triage.  Pain 6/10.

## 2012-06-27 ENCOUNTER — Other Ambulatory Visit: Payer: Self-pay

## 2012-06-27 DIAGNOSIS — E039 Hypothyroidism, unspecified: Secondary | ICD-10-CM

## 2012-06-27 DIAGNOSIS — R1013 Epigastric pain: Secondary | ICD-10-CM

## 2012-06-27 DIAGNOSIS — E1110 Type 2 diabetes mellitus with ketoacidosis without coma: Secondary | ICD-10-CM

## 2012-06-27 DIAGNOSIS — I10 Essential (primary) hypertension: Secondary | ICD-10-CM

## 2012-06-27 LAB — BASIC METABOLIC PANEL
BUN: 13 mg/dL (ref 6–23)
BUN: 11 mg/dL (ref 6–23)
BUN: 10 mg/dL (ref 6–23)
BUN: 9 mg/dL (ref 6–23)
CO2: 12 mEq/L — ABNORMAL LOW (ref 19–32)
CO2: 13 mEq/L — ABNORMAL LOW (ref 19–32)
CO2: 14 mEq/L — ABNORMAL LOW (ref 19–32)
CO2: 14 mEq/L — ABNORMAL LOW (ref 19–32)
CO2: 14 mEq/L — ABNORMAL LOW (ref 19–32)
CO2: 19 mEq/L (ref 19–32)
Calcium: 9.2 mg/dL (ref 8.4–10.5)
Chloride: 106 mEq/L (ref 96–112)
Chloride: 108 mEq/L (ref 96–112)
Chloride: 107 mEq/L (ref 96–112)
Chloride: 106 mEq/L (ref 96–112)
Chloride: 106 mEq/L (ref 96–112)
Chloride: 105 mEq/L (ref 96–112)
Creatinine, Ser: 0.77 mg/dL (ref 0.50–1.10)
Creatinine, Ser: 0.69 mg/dL (ref 0.50–1.10)
GFR calc Af Amer: 83 mL/min — ABNORMAL LOW (ref >90)
GFR calc Af Amer: >90 mL/min (ref >90)
GFR calc Af Amer: >90 mL/min (ref >90)
GFR calc non Af Amer: 72 mL/min — ABNORMAL LOW (ref >90)
GFR calc non Af Amer: 89 mL/min — ABNORMAL LOW (ref >90)
GFR calc non Af Amer: >90 mL/min (ref >90)
GFR calc non Af Amer: 90 mL/min — ABNORMAL LOW (ref >90)
Glucose, Bld: 172 mg/dL — ABNORMAL HIGH (ref 70–99)
Glucose, Bld: 195 mg/dL — ABNORMAL HIGH (ref 70–99)
Glucose, Bld: 133 mg/dL — ABNORMAL HIGH (ref 70–99)
Glucose, Bld: 135 mg/dL — ABNORMAL HIGH (ref 70–99)
Glucose, Bld: 190 mg/dL — ABNORMAL HIGH (ref 70–99)
Glucose, Bld: 85 mg/dL (ref 70–99)
Potassium: 4.1 mEq/L (ref 3.5–5.1)
Potassium: 4 mEq/L (ref 3.5–5.1)
Potassium: 4.2 mEq/L (ref 3.5–5.1)
Potassium: 4.7 mEq/L (ref 3.5–5.1)
Potassium: 4.4 mEq/L (ref 3.5–5.1)
Potassium: 4.5 mEq/L (ref 3.5–5.1)
Potassium: 3.4 mEq/L — ABNORMAL LOW (ref 3.5–5.1)
Sodium: 137 mEq/L (ref 135–145)
Sodium: 137 mEq/L (ref 135–145)
Sodium: 137 mEq/L (ref 135–145)
Sodium: 136 mEq/L (ref 135–145)
Sodium: 136 mEq/L (ref 135–145)
Sodium: 137 mEq/L (ref 135–145)

## 2012-06-27 LAB — CBC
HCT: 35.2 % — ABNORMAL LOW (ref 36.0–46.0)
MCH: 25.1 pg — ABNORMAL LOW (ref 26.0–34.0)
MCHC: 33.5 g/dL (ref 30.0–36.0)
MCV: 74.9 fL — ABNORMAL LOW (ref 78.0–100.0)
RDW: 14.7 % (ref 11.5–15.5)

## 2012-06-27 LAB — GLUCOSE, CAPILLARY
Glucose-Capillary: 192 mg/dL — ABNORMAL HIGH (ref 70–99)
Glucose-Capillary: 161 mg/dL — ABNORMAL HIGH (ref 70–99)
Glucose-Capillary: 140 mg/dL — ABNORMAL HIGH (ref 70–99)
Glucose-Capillary: 120 mg/dL — ABNORMAL HIGH (ref 70–99)
Glucose-Capillary: 162 mg/dL — ABNORMAL HIGH (ref 70–99)
Glucose-Capillary: 128 mg/dL — ABNORMAL HIGH (ref 70–99)
Glucose-Capillary: 124 mg/dL — ABNORMAL HIGH (ref 70–99)

## 2012-06-27 LAB — TROPONIN I: Troponin I: <0.3 ng/mL (ref <0.30)

## 2012-06-27 MED ORDER — INSULIN GLARGINE 100 UNIT/ML ~~LOC~~ SOLN
40.0000 [IU] | Freq: Every day | SUBCUTANEOUS | Status: DC
  Administered 2012-06-27 – 2012-06-28 (×2): 40 [IU] via SUBCUTANEOUS

## 2012-06-27 MED ORDER — INSULIN ASPART 100 UNIT/ML ~~LOC~~ SOLN
6.0000 [IU] | Freq: Three times a day (TID) | SUBCUTANEOUS | Status: DC
  Administered 2012-06-27 – 2012-06-28 (×3): 6 [IU] via SUBCUTANEOUS

## 2012-06-27 MED ORDER — INSULIN ASPART 100 UNIT/ML ~~LOC~~ SOLN
0.0000 [IU] | Freq: Three times a day (TID) | SUBCUTANEOUS | Status: DC
  Administered 2012-06-27: 3 [IU] via SUBCUTANEOUS
  Administered 2012-06-27 – 2012-06-28 (×2): 2 [IU] via SUBCUTANEOUS

## 2012-06-27 MED ORDER — SODIUM CHLORIDE 0.45 % IV SOLN
INTRAVENOUS | Status: DC
  Administered 2012-06-27: 12:00:00 via INTRAVENOUS

## 2012-06-27 MED ORDER — LEVOTHYROXINE SODIUM 125 MCG PO TABS
125.0000 ug | ORAL_TABLET | Freq: Every day | ORAL | Status: DC
  Administered 2012-06-28: 125 ug via ORAL
  Filled 2012-06-27 (×2): qty 1

## 2012-06-27 MED ORDER — TEMAZEPAM 15 MG PO CAPS
15.0000 mg | ORAL_CAPSULE | Freq: Once | ORAL | Status: AC
  Administered 2012-06-27: 15 mg via ORAL
  Filled 2012-06-27: qty 1

## 2012-06-27 MED ORDER — INSULIN ASPART 100 UNIT/ML ~~LOC~~ SOLN: 0.0000 [IU] | Freq: Every day | SUBCUTANEOUS | Status: DC

## 2012-06-27 MED ORDER — ALUM & MAG HYDROXIDE-SIMETH 200-200-20 MG/5ML PO SUSP: 15.0000 mL | ORAL | Status: DC | PRN

## 2012-06-27 MED ORDER — PANTOPRAZOLE SODIUM 40 MG IV SOLR
40.0000 mg | INTRAVENOUS | Status: DC
  Administered 2012-06-27 – 2012-06-28 (×2): 40 mg via INTRAVENOUS
  Filled 2012-06-27 (×2): qty 40

## 2012-06-27 NOTE — Progress Notes (Signed)
[facility] PROGRESS NOTE  KESTREL MIS YNW:[id number] DOB: 11/19/1950 DOA: 06/26/2012  PCP: [name], MD Endocrinologist: Dr. Sharl Ma  Brief HPI: Patient is a 61 y.o. female with a PMHx of type 2 diabetes since 1999, who presented to [facility] for evaluation of abdominal pain. Patient was seen in the ER the day prior to admission for nausea and abdominal pain but was discharged after adequate rehydration. Patient said that she saw her Endocrinologist for a followup visit. She started having nausea and vomiting at the doctor's office. She was given an injection of Phenergan. Patient vomited about 3 times in his office. Patient had another 6-7 episodes prior to showing up to the ER . Her CBG was found to be more than 500 in the office. The vomitus contained no food particles has she hasn't been able to keep anything down. Vomitus is described as just dry heaving and some ginger ale. She was having a lot of burping. Nausea and vomiting was associated with abdominal pain which has been present actually since Sunday morning. Abdominal pain was described as present in the epigastric region, 4/10 at the time of history taking, radiating to the left side and to the back, no exacerbating factors, relieved with morphine. Patient denied any fever, cough, headaches, sick contacts, palpitations. Patient did mention constipation and admitted that she had not had a bowel movement since last 5 days.  Past medical history:  Past Medical History  Diagnosis Date  . Diabetes mellitus   . Hypertension   . Hypothyroidism   . PONV (postoperative nausea and vomiting)   . Blood transfusion   . UTI (lower urinary tract infection)   . GERD (gastroesophageal reflux disease)     occ  . Fibromyalgia   . Arthritis   . Depression     Consultants: None  Procedures: None  Antibiotics: None  Subjective: Patient feels better but still feels weak. No vomiting since last afternoon. Continues to have some  discomfort in the upper abdomen. No BM yet.  Objective: Vital Signs  Filed Vitals:   06/27/12 0500 06/27/12 0600 06/27/12 0700 06/27/12 0800  BP: 143/65 120/55 137/88 133/68  Pulse: 104 102 102 96  Temp:      TempSrc:      Resp: 17 19 22 15   Height:      Weight: 86.3 kg (190 lb 4.1 oz)     SpO2: 100% 100% 98% 100%    Intake/Output Summary (Last 24 hours) at 06/27/12 0837 Last data filed at 06/27/12 0800  Gross per 24 hour  Intake 2195.55 ml  Output      0 ml  Net 2195.55 ml   Filed Weights   06/26/12 2038 06/27/12 0500  Weight: 84.3 kg (185 lb 13.6 oz) 86.3 kg (190 lb 4.1 oz)    Intake/Output from previous day: 11/19 0701 - 11/20 0700 In: 2070.3 [I.V.:1670.3; IV Piggyback:400] Out: -   General appearance: alert, cooperative, appears stated age, no distress and mildly obese Head: Normocephalic, without obvious abnormality, atraumatic Resp: clear to auscultation bilaterally Cardio: regular rate and rhythm, S1, S2 normal, no murmur, click, rub or gallop GI: soft, non-tender; bowel sounds normal; no masses,  no organomegaly Extremities: extremities normal, atraumatic, no cyanosis or edema Pulses: 2+ and symmetric Skin: Skin color, texture, turgor normal. No rashes or lesions Lymph nodes: Cervical, supraclavicular, and axillary nodes normal. Neurologic: Alert and oriented x 3. No focal deficits.  Lab Results:  Basic Metabolic Panel:  Lab 06/27/12 6578 06/27/12 0515  06/27/12 0255 06/27/12 0110 06/26/12 2316  NA 136 137 136 137 137  K 4.7 4.6 4.2 4.0 4.1  CL 106 107 108 106 107  CO2 14* 14* 13* 11* 12*  GLUCOSE 135* 133* 150* 195* 172*  BUN 11 11 12 13 14   CREATININE 0.74 0.77 0.77 0.85 0.85  CALCIUM 9.0 9.2 9.0 9.1 9.0  MG -- -- -- -- --  PHOS -- -- -- -- --   Liver Function Tests:  Lab 06/26/12 1600 06/25/12 1015  AST 16 14  ALT 13 12  ALKPHOS 206* 218*  BILITOT 0.2* 0.4  PROT 8.4* 8.3  ALBUMIN 4.1 4.2    Lab 06/26/12 1600 06/25/12 1015  LIPASE 14  13  AMYLASE -- --   No results found for this basename: AMMONIA:5 in the last 168 hours CBC:  Lab 06/26/12 1600 06/25/12 1015  WBC 10.8* 11.9*  NEUTROABS 9.2* 8.6*  HGB 14.1 14.4  HCT 42.0 41.5  MCV 75.8* 73.5*  PLT 429* 460*   Cardiac Enzymes:  Lab 06/27/12 0255 06/26/12 2105 06/25/12 1456  CKTOTAL -- -- --  CKMB -- -- --  CKMBINDEX -- -- --  TROPONINI <0.30 <0.30 <0.30   BNP (last 3 results) No results found for this basename: PROBNP:3 in the last 8760 hours CBG:  Lab 06/27/12 0737 06/27/12 0521 06/27/12 0414 06/27/12 0311 06/27/12 0207  GLUCAP 120* 132* 138* 140* 161*    Recent Results (from the past 240 hour(s))  MRSA PCR SCREENING     Status: Normal   Collection Time   06/26/12  8:31 PM      Component Value Range Status Comment   MRSA by PCR NEGATIVE  NEGATIVE Final       Studies/Results: Ct Abdomen Pelvis W Contrast  06/25/2012  *RADIOLOGY REPORT*  Clinical Data: Low back pain.  Pelvic pain.  Nausea.  CT ABDOMEN AND PELVIS WITH CONTRAST  Technique:  Multidetector CT imaging of the abdomen and pelvis was performed following the standard protocol during bolus administration of intravenous contrast.  Contrast: OMNIPAQUE IOHEXOL 300 MG/ML  SOLN  Comparison: 07/28/2011.  Findings: The lung bases are clear.  No pulmonary nodule or pleural effusion.  The heart is normal in size.  No pericardial effusion. The distal esophagus is unremarkable.  The liver demonstrates diffuse fatty infiltration more focally in the left hepatic lobe.  No focal hepatic lesions or intrahepatic ductal dilatation.  The portal and hepatic veins are patent.  The gallbladder is surgically absent.  No common bile duct dilatation. Pancreas is normal and stable.  The spleen is normal in size.  No focal lesions.  The adrenal glands are unremarkable and stable. There is a stable upper pole left renal calculus but no worrisome renal lesions or obstructing ureteral calculi.  The stomach is not well  distended with contrast.  No gross abnormalities are seen.  The duodenum, small bowel and colon are unremarkable.  No inflammatory changes or mass lesions.  The appendix is surgically absent.  There are scattered colonic diverticuli and moderate stool throughout the colon.  The aorta is normal in caliber.  Scattered atherosclerotic calcifications but no focal aneurysm or dissection.  The major branch vessels are patent.  Moderate calcification again noted at the right renal artery ostia.  No mesenteric or retroperitoneal masses or adenopathy.  The uterus and ovaries are normal and stable.  No pelvic mass, adenopathy or free pelvic fluid collections.  No inguinal mass or hernia.  The bladder is normal.  Stable surgical changes involving the lumbar spine related to posterior and interbody fusion extending from L3-S1.  The L5-S1 fusion hardware has been removed since the prior study.  No solid interbody fusion changes at L3-4.  There is a mild and likely acute compression fracture involving the superior endplate of T12.  This was not present on the prior CT scan.  IMPRESSION:  1.  No acute abdominal/pelvic findings, mass lesions or lymphadenopathy. 2.  Lumbar fusion changes from L3-S1. 3.  Acute/subacute compression fracture of T12.   Original Report Authenticated By: Rudie Meyer, M.D.     Medications:  Scheduled:    . antiseptic oral rinse  15 mL Mouth Rinse q12n4p  . aspirin  300 mg Rectal Daily  . chlorhexidine  15 mL Mouth Rinse BID  . enoxaparin (LOVENOX) injection  40 mg Subcutaneous Q24H  . [COMPLETED] insulin aspart  10 Units Subcutaneous Once  . insulin regular  0-10 Units Intravenous TID WC  . levothyroxine  125 mcg Oral Daily  . [COMPLETED]  morphine injection  4 mg Intravenous Once  . [COMPLETED]  morphine injection  4 mg Intravenous Once  . [COMPLETED] ondansetron  4 mg Intravenous Once  . [COMPLETED] pantoprazole (PROTONIX) IV  40 mg Intravenous Once  . pantoprazole (PROTONIX) IV  40 mg  Intravenous Q24H  . [COMPLETED] potassium chloride  10 mEq Intravenous Q1 Hr x 4  . [COMPLETED] sodium chloride  1,000 mL Intravenous Once  . [COMPLETED] sodium chloride  1,000 mL Intravenous Once  . [DISCONTINUED] insulin regular  10 Units Subcutaneous Once   Continuous:    . dextrose 5 % and 0.45% NaCl 1,000 mL (06/26/12 2211)  . insulin (NOVOLIN-R) infusion Stopped (06/27/12 0730)  . [DISCONTINUED] sodium chloride Stopped (06/26/12 2054)  . [DISCONTINUED] sodium chloride Stopped (06/26/12 2212)  . [DISCONTINUED] insulin (NOVOLIN-R) infusion 2.1 Units/hr (06/26/12 1942)   ION:GEXB & mag hydroxide-simeth, dextrose, morphine injection, ondansetron, sodium phosphate  Assessment/Plan:  Principal Problem:  *DKA (diabetic ketoacidoses) Active Problems:  Type II diabetes mellitus  Hypothyroidism  Hypertension  Epigastric abdominal pain    Pt is a 61 y.o. yo female with a PMHx of type 2 diabetes who was admitted on 06/26/2012 with symptoms of abdominal pain, nausea and vomiting, which was determined to be secondary to DKA.   Diabetic Ketoacidosis Acidosis is improving though AG is not closed completely. Continue IV Insulin. Keep NPO for now. Epigastric discomfort is probably from gastric irritation. Continue PPI.  Monitor electrolytes closely. No clear precipitating factor identified. This acute occurrence is thought to have been precipitated by viral gastroenteritis.   Epigastric Abdominal Pain Likely from nausea and vomiting and DKA. CT unremarkable. Lipase was normal. PPI.   History of Hypothyroidism Initiate Levothyroxine  History of Hypertension and Hypercholesterolemia Continue to monitor. Reinitiate meds as appropriate.  Code Status Full Code  DVT Prophylaxis Enoxaparin  Family Communication: None at bedside. Patient lives with her husband  Disposition Plan: Will return home when better    LOS: 1 day   [facility]  [facility] Pager  [phone] 06/27/2012, 8:37 AM  If 8PM-8AM, please contact night-coverage at [email or web address], password [facility]

## 2012-06-27 NOTE — Progress Notes (Signed)
INITIAL ADULT NUTRITION ASSESSMENT Date: 06/27/2012   Time: 2:30 PM Reason for Assessment: MST  ASSESSMENT: Female 61 y.o.  Dx: DKA (diabetic ketoacidoses)  Hx:  Past Medical History  Diagnosis Date  . Diabetes mellitus   . Hypertension   . Hypothyroidism   . PONV (postoperative nausea and vomiting)   . Blood transfusion   . UTI (lower urinary tract infection)   . GERD (gastroesophageal reflux disease)     occ  . Fibromyalgia   . Arthritis   . Depression    Past Surgical History  Procedure Date  . Cholecystectomy   . Appendectomy   . Orthopedic surgeries     multiple  . Cesarean section     x 2  . Knee arthroplasty 09    lft partial  . Knee arthroplasty     rt  . Shoulder open rotator cuff repair     rt and lft  . Breast surgery     breast reduction  . Dorsal compartment release 06/05/2012    Procedure: RELEASE DORSAL COMPARTMENT (DEQUERVAIN);  Surgeon: Wyn Forster., MD;  Location: [facility];  Service: Orthopedics;  Laterality: Left;  Excision of mixoid cyst also  . Carpal tunnel release 06/05/2012    Procedure: CARPAL TUNNEL RELEASE;  Surgeon: Wyn Forster., MD;  Location: [facility];  Service: Orthopedics;  Laterality: Left;  . Back surgery ,2005    April  2013 - [facility] fusion[facility]    Related Meds:  Scheduled Meds:   . antiseptic oral rinse  15 mL Mouth Rinse q12n4p  . aspirin  300 mg Rectal Daily  . chlorhexidine  15 mL Mouth Rinse BID  . enoxaparin (LOVENOX) injection  40 mg Subcutaneous Q24H  . insulin aspart  0-15 Units Subcutaneous TID WC  . insulin aspart  0-5 Units Subcutaneous QHS  . [COMPLETED] insulin aspart  10 Units Subcutaneous Once  . insulin aspart  6 Units Subcutaneous TID WC  . insulin glargine  40 Units Subcutaneous Daily  . levothyroxine  125 mcg Oral QAC breakfast  . [COMPLETED]  morphine injection  4 mg Intravenous Once  . [COMPLETED]  morphine injection  4 mg Intravenous Once  .  [COMPLETED] ondansetron  4 mg Intravenous Once  . [COMPLETED] pantoprazole (PROTONIX) IV  40 mg Intravenous Once  . pantoprazole (PROTONIX) IV  40 mg Intravenous Q24H  . [COMPLETED] potassium chloride  10 mEq Intravenous Q1 Hr x 4  . [COMPLETED] sodium chloride  1,000 mL Intravenous Once  . [COMPLETED] sodium chloride  1,000 mL Intravenous Once  . [DISCONTINUED] insulin regular  10 Units Subcutaneous Once  . [DISCONTINUED] insulin regular  0-10 Units Intravenous TID WC   Continuous Infusions:   . sodium chloride 75 mL/hr at 06/27/12 1218  . [EXPIRED] insulin (NOVOLIN-R) infusion 2.6 mL/hr at 06/27/12 1150  . [DISCONTINUED] sodium chloride Stopped (06/26/12 2054)  . [DISCONTINUED] sodium chloride Stopped (06/26/12 2212)  . [DISCONTINUED] dextrose 5 % and 0.45% NaCl 1,000 mL (06/26/12 2211)  . [DISCONTINUED] insulin (NOVOLIN-R) infusion 2.1 Units/hr (06/26/12 1942)   PRN Meds:.alum & mag hydroxide-simeth, dextrose, morphine injection, ondansetron, sodium phosphate   Ht: 5\' 4"  (162.6 cm)  Wt: 190 lb 4.1 oz (86.3 kg)  Ideal Wt: 120 lbs % Ideal Wt: 158%  Usual Wt: 180-190 lbs % Usual Wt: 100%  Body mass index is 32.66 kg/(m^2).  Food/Nutrition Related Hx: poor PO since 11/5 per pt report  Labs:  CMP  Component Value Date/Time   NA 136 06/27/2012 1049   K 4.5 06/27/2012 1049   CL 105 06/27/2012 1049   CO2 16* 06/27/2012 1049   GLUCOSE 250* 06/27/2012 1049   BUN 9 06/27/2012 1049   CREATININE 0.71 06/27/2012 1049   CALCIUM 8.9 06/27/2012 1049   PROT 8.4* 06/26/2012 1600   ALBUMIN 4.1 06/26/2012 1600   AST 16 06/26/2012 1600   ALT 13 06/26/2012 1600   ALKPHOS 206* 06/26/2012 1600   BILITOT 0.2* 06/26/2012 1600   GFRNONAA >90 06/27/2012 1049   GFRAA >90 06/27/2012 1049    CBC    Component Value Date/Time   WBC 7.3 06/27/2012 0845   RBC 4.70 06/27/2012 0845   HGB 11.8* 06/27/2012 0845   HCT 35.2* 06/27/2012 0845   PLT 276 06/27/2012 0845   MCV 74.9*  06/27/2012 0845   MCH 25.1* 06/27/2012 0845   MCHC 33.5 06/27/2012 0845   RDW 14.7 06/27/2012 0845   LYMPHSABS 1.3 06/26/2012 1600   MONOABS 0.3 06/26/2012 1600   EOSABS 0.0 06/26/2012 1600   BASOSABS 0.0 06/26/2012 1600    Intake: NPO Output:   Intake/Output Summary (Last 24 hours) at 06/27/12 1433 Last data filed at 06/27/12 1300  Gross per 24 hour  Intake 2787.71 ml  Output      0 ml  Net 2787.71 ml   Last BM PTA  Diet Order: NPO  Supplements/Tube Feeding: none at this time  IVF:    sodium chloride Last Rate: 75 mL/hr at 06/27/12 1218  [EXPIRED] insulin (NOVOLIN-R) infusion Last Rate: 2.6 mL/hr at 06/27/12 1150  [DISCONTINUED] sodium chloride Last Rate: Stopped (06/26/12 2054)  [DISCONTINUED] sodium chloride Last Rate: Stopped (06/26/12 2212)  [DISCONTINUED] dextrose 5 % and 0.45% NaCl Last Rate: 1,000 mL (06/26/12 2211)  [DISCONTINUED] insulin (NOVOLIN-R) infusion Last Rate: 2.1 Units/hr (06/26/12 1942)    Estimated Nutritional Needs:   Kcal: 1900-2150 Protein: 86-103g Fluid: >2.2 L/day  Pt admitted with DKA, blood glucose >500 mg/dL.  Pt states she has been unable to eat or drink anything since 11/5.   Pt states her appetite has been poor. RD notes pt to start diet likely this afternoon and will follow for diet tolerance and pt meeting needs.  Pt denies education needs at this time.  NUTRITION DIAGNOSIS: -Inadequate oral intake (NI-2.1).  Status: Ongoing  RELATED TO: nausea, vomiting  AS EVIDENCE BY: pt report  MONITORING/EVALUATION(Goals): 1.  Food/Beverage; diet advancement with tolerance.  EDUCATION NEEDS: -Education needs addressed  INTERVENTION: 1.  Modify diet; diet advancement as tolerated and as medically appropriate    DOCUMENTATION CODES Per approved criteria  -Not Applicable    Loyce Dys, MS RD LDN Clinical Inpatient Dietitian Pager: [phone] Weekend/After hours pager: [phone]  06/27/2012, 2:30 PM

## 2012-06-27 NOTE — Progress Notes (Signed)
CARE MANAGEMENT NOTE 06/27/2012  Patient:  BERKLEY, WRIGHTSMAN   Account Number:  [id number]  Date Initiated:  06/27/2012  Documentation initiated by:  DAVIS,RHONDA  Subjective/Objective Assessment:   pt with abd pain, n and v, hypotension     Action/Plan:   private residence   Anticipated DC Date:  06/30/2012   Anticipated DC Plan:  HOME/SELF CARE  In-house referral  NA      DC Planning Services  NA      [facility] Choice  NA   Choice offered to / List presented to:  NA   DME arranged  NA      DME agency  NA     HH arranged  NA      HH agency  NA   Status of service:  In process, will continue to follow Medicare Important Message given?  NA - LOS <3 / Initial given by admissions (If response is "NO", the following Medicare IM given date fields will be blank) Date Medicare IM given:   Date Additional Medicare IM given:    Discharge Disposition:    Per UR Regulation:    If discussed at Long Length of Stay Meetings, dates discussed:    Comments:  [id number]/Rhonda Earlene Plater, RN, BSN, CCM: CHART REVIEWED AND UPDATED.  Next chart review due on [id number]. NO DISCHARGE NEEDS PRESENT AT THIS TIME. CASE MANAGEMENT [phone]

## 2012-06-27 NOTE — Progress Notes (Signed)
Inpatient Diabetes Program Recommendations  AACE/ADA: New Consensus Statement on Inpatient Glycemic Control (2013)  Target Ranges:  Prepandial:   less than 140 mg/dL      Peak postprandial:   less than 180 mg/dL (1-2 hours)      Critically ill patients:  140 - 180 mg/dL   Reason for Visit: Diabetic ketoacidosis Will talk with patient regarding her being without her basal insulin for several days. Will educate patient as to need for basal insulin and prevention of DKA.  Concern that pt states she could not get her lantus.  Note: Thank you, Lenor Coffin, RN, CNS, Diabetes Coordinator [phone])

## 2012-06-28 DIAGNOSIS — E119 Type 2 diabetes mellitus without complications: Secondary | ICD-10-CM

## 2012-06-28 LAB — CBC
HCT: 32.6 % — ABNORMAL LOW (ref 36.0–46.0)
Hemoglobin: 11.1 g/dL — ABNORMAL LOW (ref 12.0–15.0)
RDW: 14.8 % (ref 11.5–15.5)
WBC: 4.2 10*3/uL (ref 4.0–10.5)

## 2012-06-28 LAB — BASIC METABOLIC PANEL
BUN: 7 mg/dL (ref 6–23)
Chloride: 106 mEq/L (ref 96–112)
GFR calc Af Amer: >90 mL/min (ref >90)
Potassium: 3.5 mEq/L (ref 3.5–5.1)
Sodium: 135 mEq/L (ref 135–145)

## 2012-06-28 LAB — GLUCOSE, CAPILLARY: Glucose-Capillary: 141 mg/dL — ABNORMAL HIGH (ref 70–99)

## 2012-06-28 MED ORDER — INSULIN GLARGINE 100 UNIT/ML ~~LOC~~ SOLN: 42.0000 [IU] | Freq: Every morning | SUBCUTANEOUS | Status: DC

## 2012-06-28 NOTE — Discharge Summary (Signed)
[facility]  Physician Discharge Summary   Patient ID: [name] MRN: [id number] DOB/AGE: 62-17-1952 62 y.o.  Admit date: 06/26/2012 Discharge date: 06/28/2012  PCP: [name], MD  DISCHARGE DIAGNOSES:  Active Problems:  Type II diabetes mellitus  Hypothyroidism  Hypertension  Epigastric abdominal pain   RECOMMENDATIONS FOR OUTPATIENT FOLLOW UP: 1. HBA1C not done in the hospital. Please consider one on follow up if appropriate.  DISCHARGE CONDITION: fair  Diet recommendation: Mod Carb  Filed Weights   06/26/12 2038 06/27/12 0500  Weight: 84.3 kg (185 lb 13.6 oz) 86.3 kg (190 lb 4.1 oz)    INITIAL HISTORY: Patient is a 61 y.o. female with a PMHx of type 2 diabetes since 1999, who presented to [facility] long for evaluation of abdominal pain. Patient was seen in the ER the day prior to admission for nausea and abdominal pain but was discharged after adequate rehydration. Patient said that she saw her Endocrinologist for a followup visit. She started having nausea and vomiting at the doctor's office. She was given an injection of Phenergan. Patient vomited about 3 times in his office. Patient had another 6-7 episodes prior to showing up to the ER . Her CBG was found to be more than 500 in the office. The vomitus contained no food particles has she hasn't been able to keep anything down. Vomitus is described as just dry heaving and some ginger ale. She was having a lot of burping. Nausea and vomiting was associated with abdominal pain which has been present actually since Sunday morning. Abdominal pain was described as present in the epigastric region, 4/10 at the time of history taking, radiating to the left side and to the back, no exacerbating factors, relieved with morphine. Patient denied any fever, cough, headaches, sick contacts, palpitations. Patient did mention constipation and admitted that she had not had a bowel movement since 5 days prior to  admission.  Consultations:  None  Procedures:  None  HOSPITAL COURSE:  Pt is a 61 y.o. yo female with a PMHx of type 2 diabetes who was admitted on 06/26/2012 with symptoms of abdominal pain, nausea and vomiting, which was determined to be secondary to DKA.   Diabetic Ketoacidosis  Patient was placed on insulin infusion. She was kept n.p.o. Blood work was done periodically. Her acidosis continue to improve. Yesterday afternoon her anion gap closed. Patient was transitioned to subcutaneous Lantus and sliding scale insulin. Since she had had no further episodes of nausea and vomiting. Her diet was resumed. She has been tolerating a diet without any difficulty since yesterday. Her blood sugars are reasonably well controlled. She has ambulated in the hallway with no difficulties. She still feels a little exhausted, but overall feels much better. Unfortunately, HbA1c was not done during this hospitalization. She has been explained this and asked to discuss this further with her endocrinologist in followup. No clear etiology for her DKA has been found. However, if it appears, that the Gastroenteritis that she had last week may have precipitated this episode. No other source of infection was found.  Epigastric Abdominal Pain  Likely from nausea and vomiting and DKA. CT unremarkable. Lipase was normal. This has improved and should continue to improve.  History of Hypothyroidism  Continue Levothyroxine   History of Hypertension and Hypercholesterolemia  Continue home medications.  Patient is considered stable for discharge  PERTINENT LABS:  The results of significant diagnostics from this hospitalization (including imaging, microbiology, ancillary and laboratory) are listed below for reference.  Microbiology: Recent Results (from the past 240 hour(s))  MRSA PCR SCREENING     Status: Normal   Collection Time   06/26/12  8:31 PM      Component Value Range Status Comment   MRSA by PCR  NEGATIVE  NEGATIVE Final      Labs: Basic Metabolic Panel:  Lab 06/28/12 8657 06/27/12 1734 06/27/12 1049 06/27/12 0845 06/27/12 0635  NA 135 137 136 133* 136  K 3.5 3.4* 4.5 4.4 4.7  CL 106 106 105 106 106  CO2 18* 19 16* 14* 14*  GLUCOSE 107* 85 250* 190* 135*  BUN 7 8 9 10 11   CREATININE 0.65 0.73 0.71 0.69 0.74  CALCIUM 8.5 9.2 8.9 8.9 9.0  MG -- -- -- -- --  PHOS -- -- -- -- --   Liver Function Tests:  Lab 06/26/12 1600 06/25/12 1015  AST 16 14  ALT 13 12  ALKPHOS 206* 218*  BILITOT 0.2* 0.4  PROT 8.4* 8.3  ALBUMIN 4.1 4.2    Lab 06/26/12 1600 06/25/12 1015  LIPASE 14 13  AMYLASE -- --   No results found for this basename: AMMONIA:5 in the last 168 hours CBC:  Lab 06/28/12 0330 06/27/12 0845 06/26/12 1600 06/25/12 1015  WBC 4.2 7.3 10.8* 11.9*  NEUTROABS -- -- 9.2* 8.6*  HGB 11.1* 11.8* 14.1 14.4  HCT 32.6* 35.2* 42.0 41.5  MCV 74.6* 74.9* 75.8* 73.5*  PLT 213 276 429* 460*   Cardiac Enzymes:  Lab 06/27/12 0845 06/27/12 0255 06/26/12 2105 06/25/12 1456  CKTOTAL -- -- -- --  CKMB -- -- -- --  CKMBINDEX -- -- -- --  TROPONINI <0.30 <0.30 <0.30 <0.30   BNP: BNP (last 3 results) No results found for this basename: PROBNP:3 in the last 8760 hours CBG:  Lab 06/28/12 0728 06/27/12 2113 06/27/12 1653 06/27/12 1353 06/27/12 1250  GLUCAP 141* 124* 128* 191* 199*     IMAGING STUDIES Ct Abdomen Pelvis W Contrast  06/25/2012  *RADIOLOGY REPORT*  Clinical Data: Low back pain.  Pelvic pain.  Nausea.  CT ABDOMEN AND PELVIS WITH CONTRAST  Technique:  Multidetector CT imaging of the abdomen and pelvis was performed following the standard protocol during bolus administration of intravenous contrast.  Contrast: OMNIPAQUE IOHEXOL 300 MG/ML  SOLN  Comparison: 07/28/2011.  Findings: The lung bases are clear.  No pulmonary nodule or pleural effusion.  The heart is normal in size.  No pericardial effusion. The distal esophagus is unremarkable.  The liver  demonstrates diffuse fatty infiltration more focally in the left hepatic lobe.  No focal hepatic lesions or intrahepatic ductal dilatation.  The portal and hepatic veins are patent.  The gallbladder is surgically absent.  No common bile duct dilatation. Pancreas is normal and stable.  The spleen is normal in size.  No focal lesions.  The adrenal glands are unremarkable and stable. There is a stable upper pole left renal calculus but no worrisome renal lesions or obstructing ureteral calculi.  The stomach is not well distended with contrast.  No gross abnormalities are seen.  The duodenum, small bowel and colon are unremarkable.  No inflammatory changes or mass lesions.  The appendix is surgically absent.  There are scattered colonic diverticuli and moderate stool throughout the colon.  The aorta is normal in caliber.  Scattered atherosclerotic calcifications but no focal aneurysm or dissection.  The major branch vessels are patent.  Moderate calcification again noted at the right renal artery ostia.  No mesenteric  or retroperitoneal masses or adenopathy.  The uterus and ovaries are normal and stable.  No pelvic mass, adenopathy or free pelvic fluid collections.  No inguinal mass or hernia.  The bladder is normal.  Stable surgical changes involving the lumbar spine related to posterior and interbody fusion extending from L3-S1.  The L5-S1 fusion hardware has been removed since the prior study.  No solid interbody fusion changes at L3-4.  There is a mild and likely acute compression fracture involving the superior endplate of T12.  This was not present on the prior CT scan.  IMPRESSION:  1.  No acute abdominal/pelvic findings, mass lesions or lymphadenopathy. 2.  Lumbar fusion changes from L3-S1. 3.  Acute/subacute compression fracture of T12.   Original Report Authenticated By: Rudie Meyer, M.D.     DISCHARGE EXAMINATION: Filed Vitals:   06/27/12 2000 06/28/12 0000 06/28/12 0400 06/28/12 0800  BP: 152/77  122/67 147/77   Pulse: 109 89 88   Temp: 97.9 F (36.6 C) 98.3 F (36.8 C) 97.7 F (36.5 C) 97.8 F (36.6 C)  TempSrc: Oral Oral Oral Oral  Resp: 15 18 15    Height:      Weight:      SpO2: 100% 96% 100%    General appearance: alert, cooperative, appears stated age and no distress Head: Normocephalic, without obvious abnormality, atraumatic Resp: clear to auscultation bilaterally Cardio: regular rate and rhythm, S1, S2 normal, no murmur, click, rub or gallop GI: soft, non-tender; bowel sounds normal; no masses,  no organomegaly Extremities: extremities normal, atraumatic, no cyanosis or edema Neurologic: Grossly normal  DISPOSITION: Home  Discharge Orders    Future Orders Please Complete By Expires   Diet - low sodium heart healthy      Increase activity slowly      Discharge instructions      Comments:   Be sure to follow up with Dr. Sharl Ma or Dr. Cliffton Asters by next week.   May walk up steps      May shower / Bathe        Current Discharge Medication List    CONTINUE these medications which have CHANGED   Details  insulin glargine (LANTUS) 100 UNIT/ML injection Inject 42 Units into the skin every morning. Qty: 10 mL      CONTINUE these medications which have NOT CHANGED   Details  aspirin 81 MG tablet Take 81 mg by mouth daily.    Calcium Carbonate-Vitamin D (CALCIUM 600 + D PO) Take 1 tablet by mouth daily.     colesevelam (WELCHOL) 625 MG tablet Take 1,250 mg by mouth 2 (two) times daily with a meal.     cyclobenzaprine (FLEXERIL) 10 MG tablet Take 10 mg by mouth 3 (three) times daily as needed. For muscle pain    esomeprazole (NEXIUM) 20 MG capsule Take 20 mg by mouth as needed.    gabapentin (NEURONTIN) 300 MG capsule Take 300 mg by mouth 3 (three) times daily.    HYDROcodone-acetaminophen (NORCO/VICODIN) 5-325 MG per tablet Take 1-2 tablets by mouth every 4 (four) hours as needed. pain    insulin lispro (HUMALOG) 100 UNIT/ML injection Inject 12 Units into the  skin 3 (three) times daily before meals. Take 10 u meal coverage plus SSI: If CBG < 70: Drink juice; CBG 70-120: 0 u; CBG 121-150: 3 u; CBG 151-200: 4 u; CBG 201-250: 7 u; CBG 251-300: 11 u;CBG 301-350: 15 u; CBG 351-400: 20 u; CBG > 400: call MD    levothyroxine (SYNTHROID,  LEVOTHROID) 125 MCG tablet Take 125 mcg by mouth daily.     losartan-hydrochlorothiazide (HYZAAR) 100-12.5 MG per tablet Take 1 tablet by mouth daily.    metFORMIN (GLUCOPHAGE) 1000 MG tablet Take 1,000 mg by mouth 2 (two) times daily with a meal.    metoprolol (TOPROL-XL) 100 MG 24 hr tablet Take 100 mg by mouth daily.      Multiple Vitamin (MULITIVITAMIN WITH MINERALS) TABS Take 1 tablet by mouth daily.      promethazine (PHENERGAN) 25 MG suppository Place 25 mg rectally every 6 (six) hours as needed. For nausea.     temazepam (RESTORIL) 30 MG capsule Take 30 mg by mouth at bedtime as needed. For sleep    venlafaxine (EFFEXOR-XR) 37.5 MG 24 hr capsule Take 37.5 mg by mouth daily.       Follow-up Information    Follow up with KERR,JEFFREY, MD. (Patient has an appt next week)    Contact information:   [address] [facility] [place] [address] [phone]       Call [name], MD. (As needed)    Contact information:   [address] [place] [address] [phone]          TOTAL DISCHARGE TIME: 35 mins  [facility]  [facility] Pager [phone]  06/28/2012, 8:48 AM

## 2012-06-28 NOTE — Progress Notes (Signed)
Inpatient Diabetes Program Recommendations  AACE/ADA: New Consensus Statement on Inpatient Glycemic Control (2013)  Target Ranges:  Prepandial:   less than 140 mg/dL      Peak postprandial:   less than 180 mg/dL (1-2 hours)      Critically ill patients:  140 - 180 mg/dL   Reason for Visit: Consult - DKA  Patient is a 61 y.o. female with a PMHx of type 2 diabetes since 1999, who presented to [facility] long for evaluation of abdominal pain. Patient was seen in the ER the day prior to admission for nausea and abdominal pain but was discharged after adequate rehydration. Patient said that she saw her Endocrinologist for a followup visit. She started having nausea and vomiting at the doctor's office. She was given an injection of Phenergan. Patient vomited about 3 times in his office. Patient had another 6-7 episodes prior to showing up to the ER . Her CBG was found to be more than 500 in the office.  Pt states she ran out of Lantus on Friday, 12/15, called Dr. Daune Perch office and was told insulin could not be called in because she had not seen MD in past year.  Her PCP, Dr. Cliffton Asters, would not call it in because the endo was managing DM.  "It was just all a mix-up. I went back and forth between Dr. Daune Perch office and Dr. Lucilla Lame office."  It is noted that pt was admitted for DKA in 07/2011.  Lengthy discussion with pt regarding importance of taking both Lantus and Humalog at home and checking blood sugars at least 3 times/day.  Pt verbalized she "hated taking insulin and they told me if I lost weight I wouldn't have to take it.  I lost 52 pounds and then ended up having to still take the insulin. Pt states this was 14 years ago and she's been taking insulin ever since.  Has been to several diabetes education classes and has met with RD in endo's office.  States she has a lot of stress with family and knows that stress runs her blood sugars up.  Ate breakfast with no complaints - said this was first meal she had  eaten in several days.  Results for GWENDOLINE, JUDY (MRN [id number]) as of 06/28/2012 10:47  Ref. Range 06/27/2012 10:48 06/27/2012 11:46 06/27/2012 12:50 06/27/2012 13:53 06/27/2012 16:53 06/27/2012 21:13 06/28/2012 07:28  Glucose-Capillary Latest Range: 70-99 mg/dL 914 (H) 782 (H) 956 (H) 191 (H) 128 (H) 124 (H) 141 (H)  Results for OTHELLA, SLAPPEY (MRN [id number]) as of 06/28/2012 10:47  Ref. Range 06/28/2012 03:30  Sodium Latest Range: 135-145 mEq/L 135  Potassium Latest Range: 3.5-5.1 mEq/L 3.5  Chloride Latest Range: 96-112 mEq/L 106  CO2 Latest Range: 19-32 mEq/L 18 (L)  BUN Latest Range: 6-23 mg/dL 7  Creatinine Latest Range: 0.50-1.10 mg/dL 4.69  Calcium Latest Range: 8.4-10.5 mg/dL 8.5  GFR calc non Af Amer Latest Range: >90 mL/min >90  GFR calc Af Amer Latest Range: >90 mL/min >90  Glucose Latest Range: 70-99 mg/dL 629 (H)  Results for AISLINN, FELIZ (MRN [id number]) as of 06/28/2012 10:47  Ref. Range 07/28/2011 16:30  Hemoglobin A1C Latest Range: <5.7 % 11.2 (H)    DKA resolved and pt is ready for discharge.  States she will followup with Dr. Cliffton Asters and take logbook of blood sugars for her review.  States she is not interested in f/u with her current endo at this time.  Reviewed sick day rules with pt.  To be discharged on: Lantus 42 units QD Humalog 12 units tid Metformin 1000 mg bid  Thank you.

## 2012-07-06 ENCOUNTER — Encounter (HOSPITAL_COMMUNITY): Payer: Self-pay | Admitting: *Deleted

## 2012-07-06 ENCOUNTER — Inpatient Hospital Stay (HOSPITAL_COMMUNITY)
Admission: EM | Admit: 2012-07-06 | Discharge: 2012-07-10 | DRG: 690 | Disposition: A | Discharge status: Home / Self Care | Payer: 59 | Attending: Internal Medicine | Admitting: Internal Medicine

## 2012-07-06 DIAGNOSIS — Z880 Allergy status to penicillin: Secondary | ICD-10-CM

## 2012-07-06 DIAGNOSIS — N390 Urinary tract infection, site not specified: Principal | ICD-10-CM

## 2012-07-06 DIAGNOSIS — E871 Hypo-osmolality and hyponatremia: Secondary | ICD-10-CM | POA: Diagnosis present

## 2012-07-06 DIAGNOSIS — S22009A Unspecified fracture of unspecified thoracic vertebra, initial encounter for closed fracture: Secondary | ICD-10-CM | POA: Diagnosis present

## 2012-07-06 DIAGNOSIS — N12 Tubulo-interstitial nephritis, not specified as acute or chronic: Secondary | ICD-10-CM

## 2012-07-06 DIAGNOSIS — E1142 Type 2 diabetes mellitus with diabetic polyneuropathy: Secondary | ICD-10-CM | POA: Diagnosis present

## 2012-07-06 DIAGNOSIS — I10 Essential (primary) hypertension: Secondary | ICD-10-CM

## 2012-07-06 DIAGNOSIS — Y929 Unspecified place or not applicable: Secondary | ICD-10-CM

## 2012-07-06 DIAGNOSIS — E039 Hypothyroidism, unspecified: Secondary | ICD-10-CM

## 2012-07-06 DIAGNOSIS — K219 Gastro-esophageal reflux disease without esophagitis: Secondary | ICD-10-CM | POA: Diagnosis present

## 2012-07-06 DIAGNOSIS — M129 Arthropathy, unspecified: Secondary | ICD-10-CM | POA: Diagnosis present

## 2012-07-06 DIAGNOSIS — T40605A Adverse effect of unspecified narcotics, initial encounter: Secondary | ICD-10-CM | POA: Diagnosis not present

## 2012-07-06 DIAGNOSIS — R1115 Cyclical vomiting syndrome unrelated to migraine: Secondary | ICD-10-CM

## 2012-07-06 DIAGNOSIS — IMO0001 Reserved for inherently not codable concepts without codable children: Secondary | ICD-10-CM | POA: Diagnosis present

## 2012-07-06 DIAGNOSIS — F329 Major depressive disorder, single episode, unspecified: Secondary | ICD-10-CM | POA: Diagnosis present

## 2012-07-06 DIAGNOSIS — K3184 Gastroparesis: Secondary | ICD-10-CM | POA: Diagnosis present

## 2012-07-06 DIAGNOSIS — R112 Nausea with vomiting, unspecified: Secondary | ICD-10-CM | POA: Diagnosis present

## 2012-07-06 DIAGNOSIS — R232 Flushing: Secondary | ICD-10-CM | POA: Diagnosis not present

## 2012-07-06 DIAGNOSIS — F3289 Other specified depressive episodes: Secondary | ICD-10-CM | POA: Diagnosis present

## 2012-07-06 DIAGNOSIS — N179 Acute kidney failure, unspecified: Secondary | ICD-10-CM

## 2012-07-06 DIAGNOSIS — X58XXXA Exposure to other specified factors, initial encounter: Secondary | ICD-10-CM | POA: Diagnosis present

## 2012-07-06 DIAGNOSIS — N200 Calculus of kidney: Secondary | ICD-10-CM | POA: Diagnosis present

## 2012-07-06 DIAGNOSIS — E878 Other disorders of electrolyte and fluid balance, not elsewhere classified: Secondary | ICD-10-CM | POA: Diagnosis present

## 2012-07-06 DIAGNOSIS — Z888 Allergy status to other drugs, medicaments and biological substances status: Secondary | ICD-10-CM

## 2012-07-06 DIAGNOSIS — E119 Type 2 diabetes mellitus without complications: Secondary | ICD-10-CM

## 2012-07-06 DIAGNOSIS — D500 Iron deficiency anemia secondary to blood loss (chronic): Secondary | ICD-10-CM

## 2012-07-06 DIAGNOSIS — Z794 Long term (current) use of insulin: Secondary | ICD-10-CM

## 2012-07-06 DIAGNOSIS — E1149 Type 2 diabetes mellitus with other diabetic neurological complication: Secondary | ICD-10-CM | POA: Diagnosis present

## 2012-07-06 DIAGNOSIS — K760 Fatty (change of) liver, not elsewhere classified: Secondary | ICD-10-CM

## 2012-07-06 DIAGNOSIS — Z885 Allergy status to narcotic agent status: Secondary | ICD-10-CM

## 2012-07-06 DIAGNOSIS — Y9210 Unspecified residential institution as the place of occurrence of the external cause: Secondary | ICD-10-CM | POA: Diagnosis not present

## 2012-07-06 DIAGNOSIS — R1013 Epigastric pain: Secondary | ICD-10-CM

## 2012-07-06 DIAGNOSIS — R739 Hyperglycemia, unspecified: Secondary | ICD-10-CM

## 2012-07-06 LAB — URINALYSIS, ROUTINE W REFLEX MICROSCOPIC
Bilirubin Urine: NEGATIVE
Glucose, UA: >1000 mg/dL — AB
Ketones, ur: NEGATIVE mg/dL
Nitrite: NEGATIVE
Protein, ur: 100 mg/dL — AB
Specific Gravity, Urine: 1.029 (ref 1.005–1.030)
Urobilinogen, UA: 1 mg/dL (ref 0.0–1.0)
pH: 5.5 (ref 5.0–8.0)

## 2012-07-06 LAB — COMPREHENSIVE METABOLIC PANEL WITH GFR
Albumin: 2.6 g/dL — ABNORMAL LOW (ref 3.5–5.2)
BUN: 14 mg/dL (ref 6–23)
CO2: 23 meq/L (ref 19–32)
Chloride: 93 meq/L — ABNORMAL LOW (ref 96–112)
Creatinine, Ser: 0.91 mg/dL (ref 0.50–1.10)
GFR calc non Af Amer: 66 mL/min — ABNORMAL LOW (ref >90)
Total Bilirubin: 0.3 mg/dL (ref 0.3–1.2)

## 2012-07-06 LAB — CBC WITH DIFFERENTIAL/PLATELET
Basophils Absolute: 0 K/uL (ref 0.0–0.1)
Basophils Relative: 0 % (ref 0–1)
Eosinophils Absolute: 0 K/uL (ref 0.0–0.7)
Eosinophils Relative: 0 % (ref 0–5)
HCT: 32.1 % — ABNORMAL LOW (ref 36.0–46.0)
Hemoglobin: 10.7 g/dL — ABNORMAL LOW (ref 12.0–15.0)
Lymphocytes Relative: 6 % — ABNORMAL LOW (ref 12–46)
Lymphs Abs: 0.4 K/uL — ABNORMAL LOW (ref 0.7–4.0)
MCH: 25.4 pg — ABNORMAL LOW (ref 26.0–34.0)
MCHC: 33.3 g/dL (ref 30.0–36.0)
MCV: 76.1 fL — ABNORMAL LOW (ref 78.0–100.0)
Monocytes Absolute: 0.6 10*3/uL (ref 0.1–1.0)
Monocytes Relative: 9 % (ref 3–12)
Neutro Abs: 5.9 10*3/uL (ref 1.7–7.7)
Neutrophils Relative %: 85 % — ABNORMAL HIGH (ref 43–77)
Platelets: 202 K/uL (ref 150–400)
RBC: 4.22 MIL/uL (ref 3.87–5.11)
RDW: 14.7 % (ref 11.5–15.5)
WBC: 7 K/uL (ref 4.0–10.5)

## 2012-07-06 LAB — COMPREHENSIVE METABOLIC PANEL
ALT: 12 U/L (ref 0–35)
AST: 23 U/L (ref 0–37)
Alkaline Phosphatase: 148 U/L — ABNORMAL HIGH (ref 39–117)
Calcium: 8.8 mg/dL (ref 8.4–10.5)
GFR calc Af Amer: 77 mL/min — ABNORMAL LOW (ref >90)
Glucose, Bld: 314 mg/dL — ABNORMAL HIGH (ref 70–99)
Potassium: 3.6 mEq/L (ref 3.5–5.1)
Sodium: 134 mEq/L — ABNORMAL LOW (ref 135–145)
Total Protein: 6.4 g/dL (ref 6.0–8.3)

## 2012-07-06 LAB — BLOOD GAS, VENOUS
Acid-Base Excess: 4.6 mmol/L — ABNORMAL HIGH (ref 0.0–2.0)
Bicarbonate: 27.4 mEq/L — ABNORMAL HIGH (ref 20.0–24.0)
O2 Saturation: 70.4 %
pCO2, Ven: 34.8 mmHg — ABNORMAL LOW (ref 45.0–50.0)
pO2, Ven: 34.2 mmHg (ref 30.0–45.0)

## 2012-07-06 LAB — URINE MICROSCOPIC-ADD ON

## 2012-07-06 MED ORDER — ONDANSETRON HCL 4 MG/2ML IJ SOLN: 4.0000 mg | Freq: Four times a day (QID) | INTRAMUSCULAR | Status: DC | PRN

## 2012-07-06 MED ORDER — SODIUM CHLORIDE 0.9 % IV BOLUS (SEPSIS)
1000.0000 mL | Freq: Once | INTRAVENOUS | Status: AC
Start: 2012-07-06 — End: 2012-07-06
  Administered 2012-07-06: 1000 mL via INTRAVENOUS

## 2012-07-06 MED ORDER — HYDROCODONE-ACETAMINOPHEN 5-325 MG PO TABS
1.0000 | ORAL_TABLET | ORAL | Status: DC | PRN
  Administered 2012-07-06 – 2012-07-08 (×4): 2 via ORAL
  Administered 2012-07-10: 1 via ORAL
  Filled 2012-07-06: qty 2
  Filled 2012-07-06: qty 1
  Filled 2012-07-06 (×3): qty 2

## 2012-07-06 MED ORDER — LEVOTHYROXINE SODIUM 125 MCG PO TABS
125.0000 ug | ORAL_TABLET | Freq: Every day | ORAL | Status: DC
  Administered 2012-07-06 – 2012-07-10 (×4): 125 ug via ORAL
  Filled 2012-07-06 (×6): qty 1

## 2012-07-06 MED ORDER — INSULIN GLARGINE 100 UNIT/ML ~~LOC~~ SOLN
20.0000 [IU] | Freq: Every morning | SUBCUTANEOUS | Status: DC
  Administered 2012-07-07 – 2012-07-08 (×2): 20 [IU] via SUBCUTANEOUS

## 2012-07-06 MED ORDER — PANTOPRAZOLE SODIUM 40 MG PO TBEC
80.0000 mg | DELAYED_RELEASE_TABLET | Freq: Two times a day (BID) | ORAL | Status: DC
  Administered 2012-07-07 – 2012-07-10 (×7): 80 mg via ORAL
  Filled 2012-07-06 (×11): qty 2

## 2012-07-06 MED ORDER — GABAPENTIN 300 MG PO CAPS
300.0000 mg | ORAL_CAPSULE | Freq: Three times a day (TID) | ORAL | Status: DC
  Administered 2012-07-06 – 2012-07-10 (×11): 300 mg via ORAL
  Filled 2012-07-06 (×13): qty 1

## 2012-07-06 MED ORDER — PROMETHAZINE HCL 25 MG PO TABS
25.0000 mg | ORAL_TABLET | Freq: Four times a day (QID) | ORAL | Status: DC | PRN
Start: 2012-07-06 — End: 2012-07-07

## 2012-07-06 MED ORDER — VENLAFAXINE HCL ER 37.5 MG PO CP24
37.5000 mg | ORAL_CAPSULE | Freq: Every day | ORAL | Status: DC
  Administered 2012-07-06 – 2012-07-10 (×5): 37.5 mg via ORAL
  Filled 2012-07-06 (×5): qty 1

## 2012-07-06 MED ORDER — ONDANSETRON HCL 4 MG/2ML IJ SOLN
4.0000 mg | Freq: Once | INTRAMUSCULAR | Status: AC
  Administered 2012-07-06: 4 mg via INTRAVENOUS
  Filled 2012-07-06: qty 2

## 2012-07-06 MED ORDER — MORPHINE SULFATE 4 MG/ML IJ SOLN
4.0000 mg | Freq: Once | INTRAMUSCULAR | Status: AC
  Administered 2012-07-06: 4 mg via INTRAVENOUS
  Filled 2012-07-06: qty 1

## 2012-07-06 MED ORDER — ALUM & MAG HYDROXIDE-SIMETH 200-200-20 MG/5ML PO SUSP
30.0000 mL | Freq: Four times a day (QID) | ORAL | Status: DC | PRN
  Administered 2012-07-09: 30 mL via ORAL
  Filled 2012-07-06: qty 30

## 2012-07-06 MED ORDER — ONDANSETRON HCL 4 MG PO TABS
4.0000 mg | ORAL_TABLET | Freq: Four times a day (QID) | ORAL | Status: DC | PRN
  Administered 2012-07-06 – 2012-07-08 (×4): 4 mg via ORAL
  Filled 2012-07-06 (×4): qty 1

## 2012-07-06 MED ORDER — SODIUM CHLORIDE 0.9 % IV SOLN
INTRAVENOUS | Status: DC
  Administered 2012-07-06 – 2012-07-08 (×2): via INTRAVENOUS

## 2012-07-06 MED ORDER — ADULT MULTIVITAMIN W/MINERALS CH
1.0000 | ORAL_TABLET | Freq: Every day | ORAL | Status: DC
  Administered 2012-07-06 – 2012-07-10 (×5): 1 via ORAL
  Filled 2012-07-06 (×5): qty 1

## 2012-07-06 MED ORDER — SODIUM CHLORIDE 0.9 % IV SOLN
INTRAVENOUS | Status: AC
  Administered 2012-07-06: 21:00:00 via INTRAVENOUS

## 2012-07-06 MED ORDER — ACETAMINOPHEN 500 MG PO TABS
1000.0000 mg | ORAL_TABLET | Freq: Four times a day (QID) | ORAL | Status: DC | PRN
  Administered 2012-07-06 – 2012-07-09 (×4): 1000 mg via ORAL
  Filled 2012-07-06 (×4): qty 2

## 2012-07-06 MED ORDER — INSULIN ASPART 100 UNIT/ML ~~LOC~~ SOLN
0.0000 [IU] | Freq: Three times a day (TID) | SUBCUTANEOUS | Status: DC
  Administered 2012-07-07: 11 [IU] via SUBCUTANEOUS
  Administered 2012-07-07: 15 [IU] via SUBCUTANEOUS
  Administered 2012-07-08 (×2): 8 [IU] via SUBCUTANEOUS
  Administered 2012-07-08 – 2012-07-09 (×2): 11 [IU] via SUBCUTANEOUS

## 2012-07-06 MED ORDER — ONDANSETRON HCL 4 MG/2ML IJ SOLN: 4.0000 mg | Freq: Three times a day (TID) | INTRAMUSCULAR | Status: DC | PRN

## 2012-07-06 MED ORDER — COLESEVELAM HCL 625 MG PO TABS
1250.0000 mg | ORAL_TABLET | Freq: Two times a day (BID) | ORAL | Status: DC
  Administered 2012-07-07 – 2012-07-10 (×7): 1250 mg via ORAL
  Filled 2012-07-06 (×10): qty 2

## 2012-07-06 MED ORDER — METOPROLOL SUCCINATE ER 100 MG PO TB24
100.0000 mg | ORAL_TABLET | Freq: Every day | ORAL | Status: DC
  Administered 2012-07-06 – 2012-07-10 (×5): 100 mg via ORAL
  Filled 2012-07-06 (×5): qty 1

## 2012-07-06 MED ORDER — DEXTROSE 5 % IV SOLN
1.0000 g | Freq: Once | INTRAVENOUS | Status: AC
  Administered 2012-07-06: 1 g via INTRAVENOUS
  Filled 2012-07-06: qty 10

## 2012-07-06 MED ORDER — SODIUM CHLORIDE 0.9 % IV BOLUS (SEPSIS)
500.0000 mL | Freq: Once | INTRAVENOUS | Status: AC
  Administered 2012-07-06: 500 mL via INTRAVENOUS

## 2012-07-06 MED ORDER — TEMAZEPAM 15 MG PO CAPS
30.0000 mg | ORAL_CAPSULE | Freq: Every evening | ORAL | Status: DC | PRN
Start: 2012-07-06 — End: 2012-07-10
  Administered 2012-07-07 – 2012-07-09 (×3): 30 mg via ORAL
  Filled 2012-07-06 (×3): qty 2

## 2012-07-06 MED ORDER — POLYETHYLENE GLYCOL 3350 17 G PO PACK
17.0000 g | PACK | Freq: Every day | ORAL | Status: DC | PRN
  Filled 2012-07-06: qty 1

## 2012-07-06 NOTE — ED Notes (Signed)
Pt with hyperglycemia, fever and shaking, nausea and vomiting since Wednesday. Seen and treated here last week for the same

## 2012-07-06 NOTE — H&P (Signed)
[facility] History and Physical  [name] BJY:[id number] DOB: [birth year] DOA: 07/06/2012  Referring physician: Dr. Karma Ganja PCP: [name], MD  Specialists: none  Chief Complaint: Nausea, vmotting and elevated Blood glucose for the past 24 hours HPI: [name] is a 61 y.o. female  With past medical history of diabetes mellitus 2 with a last  hemoglobin A1c of 11.2 last year and epigastric abdominal pain, comes in for the above-mentioned symptoms. She also started having abdominal pain in the suprapubic area one day prior to admission. She relates she has not been able to tolerate anything by mouth in the last 24 hours. She has not been able to take her medications due to nausea and vomiting. She relates she's been having regular stools which are kind of loose. She relates is no radiation of the pain.   she relates no chest pain shortness of breath or diarrhea. Denies any dysuria.  Review of Systems: The patient denies anorexia, fever, weight loss, vision loss, decreased hearing, hoarseness,  syncope, dyspnea on exertion, peripheral edema, balance deficits, hemoptysis, melena, hematochezia, severe indigestion/heartburn, hematuria, incontinence, genital sores, muscle weakness, suspicious skin lesions, transient blindness, difficulty walking, depression, unusual weight change, abnormal bleeding, enlarged lymph nodes, angioedema, and breast masses.    Past Medical History  Diagnosis Date  . Diabetes mellitus   . Hypertension   . Hypothyroidism   . PONV (postoperative nausea and vomiting)   . Blood transfusion   . UTI (lower urinary tract infection)   . GERD (gastroesophageal reflux disease)     occ  . Fibromyalgia   . Arthritis   . Depression    Past Surgical History  Procedure Date  . Cholecystectomy   . Appendectomy   . Orthopedic surgeries     multiple  . Cesarean section     x 2  . Knee arthroplasty 09    lft partial  . Knee arthroplasty     rt  .  Shoulder open rotator cuff repair     rt and lft  . Breast surgery     breast reduction  . Dorsal compartment release 06/05/2012    Procedure: RELEASE DORSAL COMPARTMENT (DEQUERVAIN);  Surgeon: Wyn Forster., MD;  Location: [facility];  Service: Orthopedics;  Laterality: Left;  Excision of mixoid cyst also  . Carpal tunnel release 06/05/2012    Procedure: CARPAL TUNNEL RELEASE;  Surgeon: Wyn Forster., MD;  Location: [facility];  Service: Orthopedics;  Laterality: Left;  . Back surgery ,2005    April  2013 - [facility]   Social History:  reports that she has never smoked. She has never used smokeless tobacco. She reports that she does not drink alcohol or use illicit drugs.  was at home her husband has perform all his ADLs  Allergies  Allergen Reactions  . Lipitor (Atorvastatin) Itching  . Lunesta (Eszopiclone) Other (See Comments)    dizzy  . Ambien (Zolpidem Tartrate) Other (See Comments)    Up sleep walking and eating  . Codeine Nausea And Vomiting  . Crestor (Rosuvastatin) Other (See Comments)    Aching all over  . Morphine And Related Other (See Comments)    Flushing and feeling hot  . Penicillins Rash    Family History  Problem Relation Age of Onset  . Diabetes type II    . Hypothyroidism    . Heart attack Father     64s  . Diabetes type II Mother   .  Hypertension Mother      Prior to Admission medications   Medication Sig Start Date End Date Taking? Authorizing Provider  acetaminophen (TYLENOL) 500 MG tablet Take 1,000 mg by mouth every 6 (six) hours as needed. pain   Yes Historical Provider, MD  Calcium Carbonate-Vitamin D (CALCIUM 600 + D PO) Take 1 tablet by mouth daily.    Yes Historical Provider, MD  colesevelam (WELCHOL) 625 MG tablet Take 1,250 mg by mouth 2 (two) times daily with a meal.    Yes Historical Provider, MD  cyclobenzaprine (FLEXERIL) 10 MG tablet Take 10 mg by mouth 3 (three) times daily as  needed. For muscle pain   Yes Historical Provider, MD  esomeprazole (NEXIUM) 20 MG capsule Take 20 mg by mouth as needed.   Yes Historical Provider, MD  gabapentin (NEURONTIN) 300 MG capsule Take 300 mg by mouth 3 (three) times daily.   Yes Historical Provider, MD  HYDROcodone-acetaminophen (NORCO/VICODIN) 5-325 MG per tablet Take 1-2 tablets by mouth every 4 (four) hours as needed. pain   Yes Historical Provider, MD  insulin glargine (LANTUS) 100 UNIT/ML injection Inject 35 Units into the skin every morning. 06/28/12  Yes Osvaldo Shipper, MD  insulin lispro (HUMALOG) 100 UNIT/ML injection Inject 15 Units into the skin 3 (three) times daily before meals. Take 10 u meal coverage plus SSI: If CBG < 70: Drink juice; CBG 70-120: 0 u; CBG 121-150: 3 u; CBG 151-200: 4 u; CBG 201-250: 7 u; CBG 251-300: 11 u;CBG 301-350: 15 u; CBG 351-400: 20 u; CBG > 400: call MD 06/25/12  Yes Hilario Quarry, MD  levothyroxine (SYNTHROID, LEVOTHROID) 125 MCG tablet Take 125 mcg by mouth daily.    Yes Historical Provider, MD  losartan-hydrochlorothiazide (HYZAAR) 100-12.5 MG per tablet Take 1 tablet by mouth daily.   Yes Historical Provider, MD  metFORMIN (GLUCOPHAGE) 1000 MG tablet Take 1,000 mg by mouth 2 (two) times daily with a meal.   Yes Historical Provider, MD  metoprolol (TOPROL-XL) 100 MG 24 hr tablet Take 100 mg by mouth daily.     Yes Historical Provider, MD  Multiple Vitamin (MULITIVITAMIN WITH MINERALS) TABS Take 1 tablet by mouth daily.     Yes Historical Provider, MD  promethazine (PHENERGAN) 25 MG tablet Take 25 mg by mouth every 6 (six) hours as needed. nausea   Yes Historical Provider, MD  temazepam (RESTORIL) 30 MG capsule Take 30 mg by mouth at bedtime as needed. For sleep   Yes Historical Provider, MD  venlafaxine (EFFEXOR-XR) 37.5 MG 24 hr capsule Take 37.5 mg by mouth daily.   Yes Historical Provider, MD   Physical Exam: Filed Vitals:   07/06/12 1422 07/06/12 1515 07/06/12 1600 07/06/12 1630  BP:  121/59 109/58 139/59 124/58  Pulse: 120 102 97 92  Temp: 100.5 F (38.1 C)     TempSrc: Oral     Resp: 18 26 20 19   SpO2: 95% 99% 90% 96%     General:  Alert and oriented x3 no acute distress  Eyes:  Anicteric  ENT:  Dry mucous membranes  Neck:  No JVD  Cardiovascular:  Regular rate and rhythm with positive S1 and S2 no murmurs rubs gallops  Respiratory:  Good air movement clear to auscultation  Abdomen:  Positive bowel sounds on distended mild gastric tenderness and suprapubic tenderness right CVA tenderness no rebound or guarding.  Skin:  No rashes ulcerations  Musculoskeletal:  Intact  Psychiatric:  Appropriate  Neurologic:  Alert and oriented  x4 coherent for language and nonfocal.  Labs on Admission:  Basic Metabolic Panel:  Lab 07/06/12 2952  NA 134*  K 3.6  CL 93*  CO2 23  GLUCOSE 314*  BUN 14  CREATININE 0.91  CALCIUM 8.8  MG --  PHOS --   Liver Function Tests:  Lab 07/06/12 1514  AST 23  ALT 12  ALKPHOS 148*  BILITOT 0.3  PROT 6.4  ALBUMIN 2.6*   No results found for this basename: LIPASE:5,AMYLASE:5 in the last 168 hours No results found for this basename: AMMONIA:5 in the last 168 hours CBC:  Lab 07/06/12 1514  WBC 7.0  NEUTROABS 5.9  HGB 10.7*  HCT 32.1*  MCV 76.1*  PLT 202   Cardiac Enzymes: No results found for this basename: CKTOTAL:5,CKMB:5,CKMBINDEX:5,TROPONINI:5 in the last 168 hours  BNP (last 3 results) No results found for this basename: PROBNP:3 in the last 8760 hours CBG: No results found for this basename: GLUCAP:5 in the last 168 hours  Radiological Exams on Admission: No results found.  EKG: Independently reviewed.  Sinus tachycardia with nonspecific T wave changes.  Assessment/Plan  UTI (lower urinary tract infection: - I agree to admit her to the regular floor, agree with IV antibiotics Rocephin, urine and blood cultures have already been done.  Will continue IV fluids aggressively. She is hyponatremic and  hypochloremic. We'll monitor vitals closely. At this time she is not tachycardic, does not have a leukocytosis and her blood pressure seems to be stable.  Acute kidney injury: -This is most likely secondary to her ongoing use of her blood pressure medication and nausea and vomiting. I. Will started on aggressive IV fluids. We will check a basic metabolic panel in the morning. Will continue strict I.'s nose. We'll check urinary sodium and urinary creatinine. -Orthostatics.  Epigastric abdominal pain/ Blood loss anemia: -Quite concerning her epigastric abdominal pain, there's no rebound physical exam. This could be secondary to all the nausea and vomiting she's been having. She could also have a Mallory-Weiss tear. She denies any melanotic stools.  - Also in the differential includes acute coronary syndrome. Her EKG only shows nonspecific ST segment changes. We'll g ahead and cycle her cardiac enzymes x3. I will get a 2-D echo. We'll continue her home dose of aspirin. -I am also concerning that her hemoglobin was around 14, about 4 months prior to this admission. Today is 10.7. She is hemoconcentrated as she is significant intravascularly depleted. I'm sure her hemoglobin will drop after hydration, we'll have to check a CBC in the morning. I will go ahead an guiac her  stool started on protonic high dose. -Go ahead and placed 2 large bores IV. And we'll monitor for signs of bleeding.  Type II diabetes mellitus: -I will to continue her Lantus at lower dose, as she has not been tolerating anything by mouth. Once she is able to eat will titrate her Lantus as tolerated.  Hypothyroidism: -Continue Synthroid.  Hypertension: -Elita Boone her blood pressure seems to be well controlled she has not taken her blood pressure medications last 24 hours. 2 to her acute renal failure and deep crease intravascular volume. We'll hold these for the next 24 hours.  Intractable nausea and vomiting: - could be secondary to  her urinary tract infection. -She has never been told she has gastroparesis. The duration of her symptoms only been for the last 24 hours.   Code Status: full Family Communication: husband Disposition Plan: home 3-4 days. (indicate anticipated LOS)  Time  spent: [address] Rosine Beat [facility] Pager [phone]  If 7PM-7AM, please contact night-coverage [email or web address] Password [facility] 07/06/2012, 5:31 PM

## 2012-07-06 NOTE — ED Provider Notes (Signed)
History     CSN: [id number]  Arrival date & time 07/06/12  1406   First MD Initiated Contact with Patient 07/06/12 1443      Chief Complaint  Patient presents with  . Hyperglycemia  . Nausea  . Emesis  . Generalized Body Aches    (Consider location/radiation/quality/duration/timing/severity/associated sxs/prior treatment) HPI Comments: Patient with history of diabetes, recent admission for DKA presents today with two-day history of fever and chills, elevated blood sugars in the 400s at home, and myalgias. She's been taking her insulin as directed at home. Her home pain medications have not helped. She denies upper respiratory tract infection symptoms including ear pain, nasal congestion sore throat. She denies chest pain, cough, or shortness of breath. She denies diarrhea. She has some epigastric abdominal pain as well as suprapubic abdominal pain. Some of her symptoms are similar to previous urinary tract infections, however she does not have any increased dysuria or hematuria. She denies skin rashes. Onset gradual. Course gradually worsening. Nothing makes symptoms better worse. Patient denies having fever at last admission.  The history is provided by the patient.    Past Medical History  Diagnosis Date  . Diabetes mellitus   . Hypertension   . Hypothyroidism   . PONV (postoperative nausea and vomiting)   . Blood transfusion   . UTI (lower urinary tract infection)   . GERD (gastroesophageal reflux disease)     occ  . Fibromyalgia   . Arthritis   . Depression     Past Surgical History  Procedure Date  . Cholecystectomy   . Appendectomy   . Orthopedic surgeries     multiple  . Cesarean section     x 2  . Knee arthroplasty 09    lft partial  . Knee arthroplasty     rt  . Shoulder open rotator cuff repair     rt and lft  . Breast surgery     breast reduction  . Dorsal compartment release 06/05/2012    Procedure: RELEASE DORSAL COMPARTMENT (DEQUERVAIN);  Surgeon:  Wyn Forster., MD;  Location: [facility];  Service: Orthopedics;  Laterality: Left;  Excision of mixoid cyst also  . Carpal tunnel release 06/05/2012    Procedure: CARPAL TUNNEL RELEASE;  Surgeon: Wyn Forster., MD;  Location: [facility];  Service: Orthopedics;  Laterality: Left;  . Back surgery ,2005    April  2013 - [facility]    Family History  Problem Relation Age of Onset  . Diabetes type II    . Hypothyroidism    . Heart attack Father     51s    History  Substance Use Topics  . Smoking status: Never Smoker   . Smokeless tobacco: Never Used  . Alcohol Use: No    OB History    Grav Para Term Preterm Abortions TAB SAB Ect Mult Living                  Review of Systems  Constitutional: Positive for fever, chills and fatigue.  HENT: Positive for congestion. Negative for ear pain, sore throat and rhinorrhea.   Eyes: Negative for redness.  Respiratory: Negative for cough.   Cardiovascular: Negative for chest pain.  Gastrointestinal: Positive for nausea, vomiting and abdominal pain. Negative for diarrhea, constipation and blood in stool.  Genitourinary: Positive for pelvic pain (suprapubic). Negative for dysuria, hematuria, vaginal bleeding and vaginal discharge.  Musculoskeletal: Positive for myalgias (generalized).  Skin:  Negative for rash.  Neurological: Negative for headaches.    Allergies  Lipitor; Lunesta; Ambien; Codeine; Crestor; and Penicillins  Home Medications   Current Outpatient Rx  Name  Route  Sig  Dispense  Refill  . ACETAMINOPHEN 500 MG PO TABS   Oral   Take 1,000 mg by mouth every 6 (six) hours as needed. pain         . CALCIUM 600 + D PO   Oral   Take 1 tablet by mouth daily.          . COLESEVELAM HCL 625 MG PO TABS   Oral   Take 1,250 mg by mouth 2 (two) times daily with a meal.          . CYCLOBENZAPRINE HCL 10 MG PO TABS   Oral   Take 10 mg by mouth 3 (three) times daily as  needed. For muscle pain         . ESOMEPRAZOLE MAGNESIUM 20 MG PO CPDR   Oral   Take 20 mg by mouth as needed.         Marland Kitchen GABAPENTIN 300 MG PO CAPS   Oral   Take 300 mg by mouth 3 (three) times daily.         Marland Kitchen HYDROCODONE-ACETAMINOPHEN 5-325 MG PO TABS   Oral   Take 1-2 tablets by mouth every 4 (four) hours as needed. pain         . INSULIN GLARGINE 100 UNIT/ML [place] SOLN   Subcutaneous   Inject 35 Units into the skin every morning.         . INSULIN LISPRO (HUMAN) 100 UNIT/ML [place] SOLN   Subcutaneous   Inject 15 Units into the skin 3 (three) times daily before meals. Take 10 u meal coverage plus SSI: If CBG < 70: Drink juice; CBG 70-120: 0 u; CBG 121-150: 3 u; CBG 151-200: 4 u; CBG 201-250: 7 u; CBG 251-300: 11 u;CBG 301-350: 15 u; CBG 351-400: 20 u; CBG > 400: call MD         . LEVOTHYROXINE SODIUM 125 MCG PO TABS   Oral   Take 125 mcg by mouth daily.          Marland Kitchen LOSARTAN POTASSIUM-HCTZ 100-12.5 MG PO TABS   Oral   Take 1 tablet by mouth daily.         Marland Kitchen METFORMIN HCL 1000 MG PO TABS   Oral   Take 1,000 mg by mouth 2 (two) times daily with a meal.         . METOPROLOL SUCCINATE ER 100 MG PO TB24   Oral   Take 100 mg by mouth daily.           . ADULT MULTIVITAMIN W/MINERALS CH   Oral   Take 1 tablet by mouth daily.           Marland Kitchen PROMETHAZINE HCL 25 MG PO TABS   Oral   Take 25 mg by mouth every 6 (six) hours as needed. nausea         . TEMAZEPAM 30 MG PO CAPS   Oral   Take 30 mg by mouth at bedtime as needed. For sleep         . VENLAFAXINE HCL ER 37.5 MG PO CP24   Oral   Take 37.5 mg by mouth daily.           BP 121/59  Pulse 120  Temp 100.5 F (38.1 C) (Oral)  Resp 18  SpO2 95%  Physical Exam  Nursing note and vitals reviewed. Constitutional: She appears well-developed and well-nourished.  HENT:  Head: Normocephalic and atraumatic.  Right Ear: Tympanic membrane, external ear and ear canal normal.  Left Ear: Tympanic membrane,  external ear and ear canal normal.  Nose: Nose normal.  Mouth/Throat: Uvula is midline. Mucous membranes are dry. No oropharyngeal exudate, posterior oropharyngeal edema or posterior oropharyngeal erythema.  Eyes: Conjunctivae normal are normal. Right eye exhibits no discharge. Left eye exhibits no discharge.  Neck: Normal range of motion. Neck supple.  Cardiovascular: Normal rate, regular rhythm and normal heart sounds.   Pulmonary/Chest: Effort normal and breath sounds normal.  Abdominal: Soft. There is tenderness.  Neurological: She is alert.  Skin: Skin is warm and dry.  Psychiatric: She has a normal mood and affect.    ED Course  Procedures (including critical care time)  Labs Reviewed  CBC WITH DIFFERENTIAL - Abnormal; Notable for the following:    Hemoglobin 10.7 (*)     HCT 32.1 (*)     MCV 76.1 (*)     MCH 25.4 (*)     Neutrophils Relative 85 (*)     Lymphocytes Relative 6 (*)     Lymphs Abs 0.4 (*)     All other components within normal limits  COMPREHENSIVE METABOLIC PANEL - Abnormal; Notable for the following:    Sodium 134 (*)     Chloride 93 (*)     Glucose, Bld 314 (*)     Albumin 2.6 (*)     Alkaline Phosphatase 148 (*)     GFR calc non Af Amer 66 (*)     GFR calc Af Amer 77 (*)     All other components within normal limits  URINALYSIS, ROUTINE W REFLEX MICROSCOPIC - Abnormal; Notable for the following:    APPearance TURBID (*)     Glucose, UA >1000 (*)     Hgb urine dipstick LARGE (*)     Protein, ur 100 (*)     Leukocytes, UA MODERATE (*)     All other components within normal limits  URINE MICROSCOPIC-ADD ON - Abnormal; Notable for the following:    Bacteria, UA MANY (*)     All other components within normal limits  LACTIC ACID, PLASMA - Abnormal; Notable for the following:    Lactic Acid, Venous 3.1 (*)     All other components within normal limits  URINE CULTURE  BLOOD GAS, VENOUS   No results found.   1. Pyelonephritis   2. Hyperglycemia  without ketosis     2:53 PM Patient seen and examined. Previous admission reviewed. Work-up initiated. Medications ordered.   Vital signs reviewed and are as follows: Filed Vitals:   07/06/12 1422  BP: 121/59  Pulse: 120  Temp: 100.5 F (38.1 C)  Resp: 18    Date: 07/06/2012  Rate: 109  Rhythm: sinus tachycardia  QRS Axis: normal  Intervals: normal  ST/T Wave abnormalities: normal  Conduction Disutrbances:none  Narrative Interpretation:   Old EKG Reviewed: unchanged  D/w Dr. Oletta Lamas. No evidence of DKA today. Will need admission for pyelo in setting of hyperglycemia.   [facility] p[facility] and they will evaluate for admission. Mild elevation in lactate.   MDM  Pyelo in setting of DM, recent admission for DKA. Given risk of worsening at home, will admit for stabilization and treatment of pyelo and management of blood sugars.         Ivin Booty  [place], [place] 07/06/12 1717

## 2012-07-06 NOTE — ED Provider Notes (Signed)
Medical screening examination/treatment/procedure(s) were conducted as a shared visit with non-physician practitioner(s) and myself.  I personally evaluated the patient during the encounter  Pt with fever, UTI, now also hyperglycemia with mild anion gap, but no ketones yet in urine.  PH is basic, not acidotic.  Worrisome that pt may go into sepsis or DKA again.  Will treat with IVF's, insulin, IV abx and admit.    Pt requests no more morphine due to histamine release, she is very uncomfortable feeling hot and flushed.  I added to her allergy list as an adverse reaction  Gavin Pound. Deedra Pro, MD 07/06/12 1725

## 2012-07-07 DIAGNOSIS — R1013 Epigastric pain: Secondary | ICD-10-CM

## 2012-07-07 DIAGNOSIS — I359 Nonrheumatic aortic valve disorder, unspecified: Secondary | ICD-10-CM

## 2012-07-07 LAB — GLUCOSE, CAPILLARY: Glucose-Capillary: 113 mg/dL — ABNORMAL HIGH (ref 70–99)

## 2012-07-07 LAB — PROTIME-INR: Prothrombin Time: 13.9 seconds (ref 11.6–15.2)

## 2012-07-07 LAB — BASIC METABOLIC PANEL
CO2: 27 mEq/L (ref 19–32)
Calcium: 8.3 mg/dL — ABNORMAL LOW (ref 8.4–10.5)
Glucose, Bld: 334 mg/dL — ABNORMAL HIGH (ref 70–99)
Sodium: 134 mEq/L — ABNORMAL LOW (ref 135–145)

## 2012-07-07 LAB — HEMOGLOBIN A1C
Hgb A1c MFr Bld: 12 % — ABNORMAL HIGH (ref <5.7)
Mean Plasma Glucose: 298 mg/dL — ABNORMAL HIGH (ref <117)

## 2012-07-07 LAB — TROPONIN I
Troponin I: <0.3 ng/mL (ref <0.30)
Troponin I: <0.3 ng/mL (ref <0.30)

## 2012-07-07 LAB — TSH: TSH: 10.553 u[IU]/mL — ABNORMAL HIGH (ref 0.350–4.500)

## 2012-07-07 LAB — CBC
Hemoglobin: 9.2 g/dL — ABNORMAL LOW (ref 12.0–15.0)
MCH: 25.1 pg — ABNORMAL LOW (ref 26.0–34.0)
RBC: 3.66 MIL/uL — ABNORMAL LOW (ref 3.87–5.11)
WBC: 5 10*3/uL (ref 4.0–10.5)

## 2012-07-07 LAB — CREATININE, URINE, RANDOM: Creatinine, Urine: 100.6 mg/dL

## 2012-07-07 LAB — COMPREHENSIVE METABOLIC PANEL
AST: 15 U/L (ref 0–37)
Albumin: 2.2 g/dL — ABNORMAL LOW (ref 3.5–5.2)
Calcium: 8.1 mg/dL — ABNORMAL LOW (ref 8.4–10.5)
Creatinine, Ser: 0.94 mg/dL (ref 0.50–1.10)
GFR calc non Af Amer: 64 mL/min — ABNORMAL LOW (ref >90)

## 2012-07-07 LAB — SODIUM, URINE, RANDOM: Sodium, Ur: 25 mEq/L

## 2012-07-07 MED ORDER — POTASSIUM CHLORIDE CRYS ER 20 MEQ PO TBCR
40.0000 meq | EXTENDED_RELEASE_TABLET | Freq: Once | ORAL | Status: AC
  Administered 2012-07-07: 40 meq via ORAL
  Filled 2012-07-07: qty 2

## 2012-07-07 MED ORDER — DEXTROSE 5 % IV SOLN
1.0000 g | INTRAVENOUS | Status: DC
  Administered 2012-07-07: 1 g via INTRAVENOUS
  Filled 2012-07-07 (×2): qty 10

## 2012-07-07 MED ORDER — LIP MEDEX EX OINT: TOPICAL_OINTMENT | CUTANEOUS | Status: DC | PRN

## 2012-07-07 MED ORDER — PROMETHAZINE HCL 25 MG PO TABS
25.0000 mg | ORAL_TABLET | Freq: Four times a day (QID) | ORAL | Status: DC | PRN
  Administered 2012-07-08: 25 mg via ORAL
  Filled 2012-07-07: qty 1

## 2012-07-07 MED ORDER — POTASSIUM CHLORIDE 10 MEQ/100ML IV SOLN
10.0000 meq | INTRAVENOUS | Status: DC
  Administered 2012-07-07 (×2): 10 meq via INTRAVENOUS
  Filled 2012-07-07 (×4): qty 100

## 2012-07-07 NOTE — Progress Notes (Signed)
Patient ID: [name], female   DOB: [birth year], 61 y.o.   MRN: [id number] [facility] PROGRESS NOTE  [name] GNF:[id number] DOB: 07/15/1951 DOA: 07/06/2012 PCP: [name], MD  Brief narrative: Pt is 61 yo female with past medical history of diabetes mellitus 2 with a last hemoglobin A1c of 11.2 last year, presented to [facility] ED with main concern of progressively worsening generalized abdominal pain, dull in nature and intermittent, occasionally radiating to lower bilateral abdominal quadrants, with no specific alleviating factors but aggravated by food, associated with nausea and non bloody vomiting, poor oral intake. Pt denied any specific urinary symptoms other than urinary frequency. Has had similar episodes in the past.   Principal Problem:  *UTI (lower urinary tract infection) - clinically improving - will continue Rocephine day#2 - encourage PO intake and IVF PO Active Problems:  Type II diabetes mellitus - uncontrolled and with complications of neuropathy and gastroparesis - A1C is 12 - pt reports compliance - diabetic educator consult - continue Lantus and SSI  Hypothyroidism - stable - continue Synthroid   Hypertension - continue to monitor vitals per floor protocol   Epigastric abdominal pain with nausea and vomiting  - likely multifactorial and secondary to UTI imposed on hyperglycemia - resolved by this AM - supportive care  Blood loss anemia - Hg and Hct stable since admission - CBC in AM  Acute kidney injury - likely of pre renal etiology - now resolved this AM - BMP in AM  Consultants:  None  Procedures/Studies:  None  Antibiotics:  Rocephin 11/29 -->  Code Status: Full Family Communication: Pt at bedside Disposition Plan: Home when medically stable  HPI/Subjective: No events overnight.   Objective: Filed Vitals:   07/06/12 2201 07/06/12 2202 07/06/12 2230 07/07/12 0630  BP: 133/68 119/74  119/64  Pulse: 108 107  74    Temp:   100.6 F (38.1 C) 98 F (36.7 C)  TempSrc:    Oral  Resp:    18  Height:      Weight:      SpO2:    97%    Intake/Output Summary (Last 24 hours) at 07/07/12 1347 Last data filed at 07/07/12 1100  Gross per 24 hour  Intake   1375 ml  Output    500 ml  Net    875 ml    Exam:   General:  Pt is alert, follows commands appropriately, not in acute distress  Cardiovascular: Regular rhythm, tachycardic, S1/S2, no murmurs, no rubs, no gallops  Respiratory: Clear to auscultation bilaterally, no wheezing, no crackles, no rhonchi  Abdomen: Soft, non tender, non distended, bowel sounds present, no guarding  Extremities: No edema, pulses DP and PT palpable bilaterally  Neuro: Grossly nonfocal  Data Reviewed: Basic Metabolic Panel:  Lab 07/07/12 8469 07/06/12 1514  NA 138 134*  K 2.8* 3.6  CL 101 93*  CO2 27 23  GLUCOSE 72 314*  BUN 13 14  CREATININE 0.94 0.91  CALCIUM 8.1* 8.8  MG -- --  PHOS -- --   Liver Function Tests:  Lab 07/07/12 0228 07/06/12 1514  AST 15 23  ALT 9 12  ALKPHOS 122* 148*  BILITOT 0.2* 0.3  PROT 5.5* 6.4  ALBUMIN 2.2* 2.6*   CBC:  Lab 07/07/12 0228 07/06/12 1514  WBC 5.0 7.0  NEUTROABS -- 5.9  HGB 9.2* 10.7*  HCT 28.1* 32.1*  MCV 76.8* 76.1*  PLT 133* 202   Cardiac Enzymes:  Lab 07/07/12 0941  07/07/12 0228 07/06/12 2034  CKTOTAL -- -- --  CKMB -- -- --  CKMBINDEX -- -- --  TROPONINI <0.30 <0.30 <0.30   CBG:  Lab 07/07/12 1106 07/07/12 0741 07/06/12 2118 07/06/12 1449  GLUCAP 359* 92 113* 350*   Scheduled Meds:   . colesevelam  1,250 mg Oral BID WC  . gabapentin  300 mg Oral TID  . insulin aspart  0-15 Units Subcutaneous TID WC  . insulin glargine  20 Units Subcutaneous q morning - 10a  . levothyroxine  125 mcg Oral QAC breakfast  . metoprolol succinate  100 mg Oral Daily  . multivitamin   1 tablet Oral Daily  . pantoprazole  80 mg Oral BID AC  . venlafaxine XR  37.5 mg Oral Daily   Continuous Infusions:    . sodium chloride 125 mL/hr at 07/06/12 2012     Debbora Presto, MD  [facility] Pager [phone]  If 7PM-7AM, please contact night-coverage [email or web address] Password [email or web address] 07/07/2012, 1:47 PM   LOS: 1 day

## 2012-07-07 NOTE — Progress Notes (Signed)
  Echocardiogram 2D Echocardiogram has been performed.  Patrick Salemi 07/07/2012, 8:58 AM

## 2012-07-08 ENCOUNTER — Inpatient Hospital Stay (HOSPITAL_COMMUNITY): Payer: 59

## 2012-07-08 LAB — CBC
HCT: 30.8 % — ABNORMAL LOW (ref 36.0–46.0)
Hemoglobin: 9.7 g/dL — ABNORMAL LOW (ref 12.0–15.0)
MCH: 24.6 pg — ABNORMAL LOW (ref 26.0–34.0)
MCV: 78 fL (ref 78.0–100.0)
Platelets: 193 10*3/uL (ref 150–400)
RBC: 3.95 MIL/uL (ref 3.87–5.11)

## 2012-07-08 LAB — BASIC METABOLIC PANEL
BUN: 11 mg/dL (ref 6–23)
CO2: 25 mEq/L (ref 19–32)
Calcium: 8.3 mg/dL — ABNORMAL LOW (ref 8.4–10.5)
Creatinine, Ser: 1.11 mg/dL — ABNORMAL HIGH (ref 0.50–1.10)
Glucose, Bld: 309 mg/dL — ABNORMAL HIGH (ref 70–99)

## 2012-07-08 LAB — GLUCOSE, CAPILLARY
Glucose-Capillary: 325 mg/dL — ABNORMAL HIGH (ref 70–99)
Glucose-Capillary: 272 mg/dL — ABNORMAL HIGH (ref 70–99)

## 2012-07-08 MED ORDER — PIPERACILLIN-TAZOBACTAM 3.375 G IVPB
3.3750 g | Freq: Three times a day (TID) | INTRAVENOUS | Status: DC
  Filled 2012-07-08: qty 50

## 2012-07-08 MED ORDER — INSULIN GLARGINE 100 UNIT/ML ~~LOC~~ SOLN
25.0000 [IU] | Freq: Every morning | SUBCUTANEOUS | Status: DC
  Administered 2012-07-09: 25 [IU] via SUBCUTANEOUS

## 2012-07-08 MED ORDER — SODIUM CHLORIDE 0.9 % IV BOLUS (SEPSIS)
500.0000 mL | Freq: Once | INTRAVENOUS | Status: AC
  Administered 2012-07-08: 500 mL via INTRAVENOUS

## 2012-07-08 MED ORDER — LIP MEDEX EX OINT
TOPICAL_OINTMENT | CUTANEOUS | Status: AC
  Administered 2012-07-08: 08:00:00
  Filled 2012-07-08: qty 7

## 2012-07-08 MED ORDER — LEVOFLOXACIN IN D5W 500 MG/100ML IV SOLN
500.0000 mg | INTRAVENOUS | Status: DC
  Administered 2012-07-08 – 2012-07-09 (×2): 500 mg via INTRAVENOUS
  Filled 2012-07-08 (×3): qty 100

## 2012-07-08 NOTE — Progress Notes (Signed)
ANTIBIOTIC CONSULT NOTE - INITIAL  Pharmacy Consult for Levaquin Indication: UTI  Allergies  Allergen Reactions  . Lipitor (Atorvastatin) Itching  . Lunesta (Eszopiclone) Other (See Comments)    dizzy  . Ambien (Zolpidem Tartrate) Other (See Comments)    Up sleep walking and eating  . Codeine Nausea And Vomiting  . Crestor (Rosuvastatin) Other (See Comments)    Aching all over  . Morphine And Related Other (See Comments)    Flushing and feeling hot  . Penicillins Rash    Patient Measurements: Height: 5' 4.5" (163.8 cm) Weight: 198 lb (89.812 kg) IBW/kg (Calculated) : 55.85  Adjusted Body Weight:   Vital Signs: Temp: 100.6 F (38.1 C) (12/01 0600) Temp src: Oral (12/01 0600) BP: 164/86 mmHg (12/01 0600) Pulse Rate: 105  (12/01 0600) Intake/Output from previous day: 11/30 0701 - 12/01 0700 In: 5310 [P.O.:2460; I.V.:2600; IV Piggyback:250] Out: 2000 [Urine:2000] Intake/Output from this shift:    Labs:  Basename 07/08/12 0516 07/07/12 1442 07/07/12 0959 07/07/12 0228 07/06/12 1514  WBC 3.6* -- -- 5.0 7.0  HGB 9.7* -- -- 9.2* 10.7*  PLT 193 -- -- 133* 202  LABCREA -- -- 100.6 -- --  CREATININE 1.11* 0.99 -- 0.94 --   Estimated Creatinine Clearance: 57.7 ml/min (by C-G formula based on Cr of 1.11). No results found for this basename: VANCOTROUGH:2,VANCOPEAK:2,VANCORANDOM:2,GENTTROUGH:2,GENTPEAK:2,GENTRANDOM:2,TOBRATROUGH:2,TOBRAPEAK:2,TOBRARND:2,AMIKACINPEAK:2,AMIKACINTROU:2,AMIKACIN:2, in the last 72 hours   Microbiology: Recent Results (from the past 720 hour(s))  MRSA PCR SCREENING     Status: Normal   Collection Time   06/26/12  8:31 PM      Component Value Range Status Comment   MRSA by PCR NEGATIVE  NEGATIVE Final   URINE CULTURE     Status: Normal (Preliminary result)   Collection Time   07/06/12  3:01 PM      Component Value Range Status Comment   Specimen Description URINE, CLEAN CATCH   Final    Special Requests NONE   Final    Culture  Setup Time  07/07/2012 01:32   Final    Colony Count >=100,000 COLONIES/ML   Final    Culture ESCHERICHIA COLI   Final    Report Status PENDING   Incomplete     Medical History: Past Medical History  Diagnosis Date  . Diabetes mellitus   . Hypertension   . Hypothyroidism   . PONV (postoperative nausea and vomiting)   . Blood transfusion   . UTI (lower urinary tract infection)   . GERD (gastroesophageal reflux disease)     occ  . Fibromyalgia   . Arthritis   . Depression     Medications:  Scheduled:    . colesevelam  1,250 mg Oral BID WC  . gabapentin  300 mg Oral TID  . insulin aspart  0-15 Units Subcutaneous TID WC  . insulin glargine  20 Units Subcutaneous q morning - 10a  . levofloxacin (LEVAQUIN) IV  500 mg Intravenous Q24H  . levothyroxine  125 mcg Oral QAC breakfast  . [COMPLETED] lip balm      . metoprolol succinate  100 mg Oral Daily  . multivitamin with minerals  1 tablet Oral Daily  . pantoprazole  80 mg Oral BID AC  . [COMPLETED] potassium chloride  40 mEq Oral Once  . sodium chloride  500 mL Intravenous Once  . venlafaxine XR  37.5 mg Oral Daily  . [DISCONTINUED] cefTRIAXone (ROCEPHIN)  IV  1 g Intravenous Q24H  . [DISCONTINUED] piperacillin-tazobactam (ZOSYN)  IV  3.375 g  Intravenous Q8H  . [DISCONTINUED] potassium chloride  10 mEq Intravenous Q1 Hr x 4   Anti-infectives     Start     Dose/Rate Route Frequency Ordered Stop   07/08/12 1000   piperacillin-tazobactam (ZOSYN) IVPB 3.375 g  Status:  Discontinued        3.375 g 12.5 mL/hr over 240 Minutes Intravenous 3 times per day 07/08/12 0933 07/08/12 0937   07/08/12 1000   levofloxacin (LEVAQUIN) IVPB 500 mg        500 mg 100 mL/hr over 60 Minutes Intravenous Every 24 hours 07/08/12 0942     07/07/12 1600   cefTRIAXone (ROCEPHIN) 1 g in dextrose 5 % 50 mL IVPB  Status:  Discontinued        1 g 100 mL/hr over 30 Minutes Intravenous Every 24 hours 07/07/12 1352 07/08/12 0918   07/06/12 1600   cefTRIAXone  (ROCEPHIN) 1 g in dextrose 5 % 50 mL IVPB        1 g 100 mL/hr over 30 Minutes Intravenous  Once 07/06/12 1556 07/06/12 1634         Assessment: 61 yo F previously on ceftriaxone for UTI still having fevers. Pharmacy asked to start Levaquin.   Patient has pcn allergy - reaction listed as rash  Urine culture growing > 100k E.Coli - S/S pending  ABX: 11/29 >> CTX >> 12/1 12/1 >> Levaquin >>   Goal of Therapy:  Eradication of infection   Plan:  1. Begin levaquin 500 mg IV daily 2. Follow up culture s/s 3. Will adjust when necessary  [employer], Pharm.D. Clinical Oncology Pharmacist  Pager # [phone]  07/08/2012,9:43 AM

## 2012-07-08 NOTE — Progress Notes (Signed)
Patient ID: [name], female   DOB: 09-14-1950, 61 y.o.   MRN: [id number]  [facility] PROGRESS NOTE  CASSIEL KIS WUJ:[id number] DOB: [birth year] DOA: 07/06/2012 PCP: [name], MD  Brief narrative:  Pt is 61 yo female with past medical history of diabetes mellitus 2 with a last hemoglobin A1c of 11.2 last year, presented to [facility] ED with main concern of progressively worsening generalized abdominal pain, dull in nature and intermittent, occasionally radiating to lower bilateral abdominal quadrants, with no specific alleviating factors but aggravated by food, associated with nausea and non bloody vomiting, poor oral intake. Pt denied any specific urinary symptoms other than urinary frequency. Has had similar episodes in the past.   Principal Problem:  *UTI (lower urinary tract infection)  - clinically improving but still spiking fevers overnight, Tmax 101 F - I will change antibiotic to Levaquin for now and will follow upon fine urine culture report - encourage PO intake Active Problems:  Type II diabetes mellitus  - uncontrolled and with complications of neuropathy and gastroparesis  - A1C is 12  - pt reports compliance  - diabetic educator consult  - continue Lantus but will increase the dose today Hypothyroidism  - stable  - continue Synthroid  Hypertension  - continue to monitor vitals per floor protocol  Epigastric abdominal pain with nausea and vomiting  - likely multifactorial and secondary to UTI imposed on hyperglycemia  - resolved by this AM  - supportive care  Blood loss anemia  - Hg and Hct stable since admission  - CBC in AM  Acute kidney injury  - likely of pre renal etiology  - creatinine is up from yesterday - BMP in AM   Consultants:  None Procedures/Studies:  None Antibiotics:  Rocephin 11/29 --> 12/01 Levaquin 12/01 -->  Code Status: Full  Family Communication: Pt at bedside  Disposition Plan: Home when medically stable    HPI/Subjective: No events overnight. Pt reports feeling better. Tolerating PO intake.  Objective: Filed Vitals:   07/07/12 0630 07/07/12 1517 07/07/12 2200 07/08/12 0600  BP: 119/64 110/58 160/87 164/86  Pulse: 74 77 89 105  Temp: 98 F (36.7 C) 97.8 F (36.6 C) 100.3 F (37.9 C) 100.6 F (38.1 C)  TempSrc: Oral Oral Oral Oral  Resp: 18 18 20 20   Height:      Weight:    89.812 kg (198 lb)  SpO2: 97% 94% 94% 92%    Intake/Output Summary (Last 24 hours) at 07/08/12 0916 Last data filed at 07/08/12 0600  Gross per 24 hour  Intake   5310 ml  Output   2000 ml  Net   3310 ml    Exam:   General:  Pt is alert, follows commands appropriately, not in acute distress  Cardiovascular: Regular rate and rhythm, S1/S2, no murmurs, no rubs, no gallops  Respiratory: Clear to auscultation bilaterally, bibasilar crackles  Abdomen: Soft, non tender, non distended, bowel sounds present, no guarding  Extremities: No edema, pulses DP and PT palpable bilaterally  Neuro: Grossly nonfocal  Data Reviewed: Basic Metabolic Panel:  Lab 07/08/12 9562 07/07/12 1442 07/07/12 0228 07/06/12 1514  NA 133* 134* 138 134*  K 3.9 4.3 2.8* 3.6  CL 98 101 101 93*  CO2 25 27 27 23   GLUCOSE 309* 334* 72 314*  BUN 11 13 13 14   CREATININE 1.11* 0.99 0.94 0.91  CALCIUM 8.3* 8.3* 8.1* 8.8  MG -- -- -- --  PHOS -- -- -- --  Liver Function Tests:  Lab 07/07/12 0228 07/06/12 1514  AST 15 23  ALT 9 12  ALKPHOS 122* 148*  BILITOT 0.2* 0.3  PROT 5.5* 6.4  ALBUMIN 2.2* 2.6*   CBC:  Lab 07/08/12 0516 07/07/12 0228 07/06/12 1514  WBC 3.6* 5.0 7.0  NEUTROABS -- -- 5.9  HGB 9.7* 9.2* 10.7*  HCT 30.8* 28.1* 32.1*  MCV 78.0 76.8* 76.1*  PLT 193 133* 202   Cardiac Enzymes:  Lab 07/07/12 0941 07/07/12 0228 07/06/12 2034  CKTOTAL -- -- --  CKMB -- -- --  CKMBINDEX -- -- --  TROPONINI <0.30 <0.30 <0.30   CBG:  Lab 07/08/12 0725 07/07/12 2104 07/07/12 1704 07/07/12 1106 07/07/12 0741   GLUCAP 293* 246* 342* 359* 92    Recent Results (from the past 240 hour(s))  URINE CULTURE     Status: Normal (Preliminary result)   Collection Time   07/06/12  3:01 PM      Component Value Range Status Comment   Specimen Description URINE, CLEAN CATCH   Final    Special Requests NONE   Final    Culture  Setup Time 07/07/2012 01:32   Final    Colony Count >=100,000 COLONIES/ML   Final    Culture ESCHERICHIA COLI   Final    Report Status PENDING   Incomplete      Scheduled Meds:   . [COMPLETED] sodium chloride   Intravenous STAT  . cefTRIAXone (ROCEPHIN)  IV  1 g Intravenous Q24H  . colesevelam  1,250 mg Oral BID WC  . gabapentin  300 mg Oral TID  . insulin aspart  0-15 Units Subcutaneous TID WC  . insulin glargine  20 Units Subcutaneous q morning - 10a  . levothyroxine  125 mcg Oral QAC breakfast  . [COMPLETED] lip balm      . metoprolol succinate  100 mg Oral Daily  . multivitamin with minerals  1 tablet Oral Daily  . pantoprazole  80 mg Oral BID AC  . [COMPLETED] potassium chloride  40 mEq Oral Once  . venlafaxine XR  37.5 mg Oral Daily  . [DISCONTINUED] potassium chloride  10 mEq Intravenous Q1 Hr x 4   Continuous Infusions:   . sodium chloride 75 mL/hr at 07/08/12 0715     Debbora Presto, MD  [facility] Pager [phone]  If 7PM-7AM, please contact night-coverage [email or web address] Password [email or web address] 07/08/2012, 9:16 AM   LOS: 2 days

## 2012-07-08 NOTE — Progress Notes (Signed)
Patient has elevated temp. New order for antibiotics today. Patient has been been given a incentive spirometer and instructed on how to use it. Temperature will be re-assessed within the hour.

## 2012-07-09 LAB — BASIC METABOLIC PANEL
BUN: 8 mg/dL (ref 6–23)
Calcium: 8.4 mg/dL (ref 8.4–10.5)
GFR calc Af Amer: 77 mL/min — ABNORMAL LOW (ref >90)
GFR calc non Af Amer: 66 mL/min — ABNORMAL LOW (ref >90)
Potassium: 3.8 mEq/L (ref 3.5–5.1)
Sodium: 133 mEq/L — ABNORMAL LOW (ref 135–145)

## 2012-07-09 LAB — CBC
MCHC: 32.5 g/dL (ref 30.0–36.0)
RDW: 14.6 % (ref 11.5–15.5)

## 2012-07-09 LAB — URINE CULTURE: Colony Count: >=100000

## 2012-07-09 LAB — LACTIC ACID, PLASMA: Lactic Acid, Venous: 1.7 mmol/L (ref 0.5–2.2)

## 2012-07-09 LAB — GLUCOSE, CAPILLARY
Glucose-Capillary: 330 mg/dL — ABNORMAL HIGH (ref 70–99)
Glucose-Capillary: 397 mg/dL — ABNORMAL HIGH (ref 70–99)

## 2012-07-09 MED ORDER — INSULIN ASPART 100 UNIT/ML ~~LOC~~ SOLN
0.0000 [IU] | SUBCUTANEOUS | Status: DC
  Administered 2012-07-09: 8 [IU] via SUBCUTANEOUS
  Administered 2012-07-09: 11 [IU] via SUBCUTANEOUS
  Administered 2012-07-09: 8 [IU] via SUBCUTANEOUS
  Administered 2012-07-09: 15 [IU] via SUBCUTANEOUS
  Administered 2012-07-10: 3 [IU] via SUBCUTANEOUS
  Administered 2012-07-10: 15 [IU] via SUBCUTANEOUS
  Administered 2012-07-10: 3 [IU] via SUBCUTANEOUS

## 2012-07-09 MED ORDER — INSULIN GLARGINE 100 UNIT/ML ~~LOC~~ SOLN
35.0000 [IU] | Freq: Every morning | SUBCUTANEOUS | Status: DC
  Administered 2012-07-10: 35 [IU] via SUBCUTANEOUS

## 2012-07-09 NOTE — Progress Notes (Signed)
Patient ID: [name], female   DOB: February 26, 1951, 61 y.o.   MRN: [id number] [facility] PROGRESS NOTE  [name] [id number]:[id number] DOB: Jan 26, 1951 DOA: 07/06/2012 PCP: [name], MD  Brief narrative:  Pt is 61 yo female with past medical history of diabetes mellitus 2 with a last hemoglobin A1c of 11.2 last year, presented to [facility] ED with main concern of progressively worsening generalized abdominal pain, dull in nature and intermittent, occasionally radiating to lower bilateral abdominal quadrants, with no specific alleviating factors but aggravated by food, associated with nausea and non bloody vomiting, poor oral intake. Pt denied any specific urinary symptoms other than urinary frequency. Has had similar episodes in the past.   Principal Problem:  *UTI (lower urinary tract infection)  - clinically improving but still spiking fevers overnight, Tmax 102 F  - continue Levaquin as urine culture indicated sensitivity - encourage PO intake  Active Problems:  Type II diabetes mellitus  - uncontrolled and with complications of neuropathy and gastroparesis  - A1C is 12  - pt reports compliance  - diabetic educator consult appreciated  - continue Lantus but will increase the dose today to 35 units and will change SSI to moderate coverage  Hypothyroidism  - stable  - continue Synthroid  Hypertension  - continue to monitor vitals per floor protocol  Epigastric abdominal pain with nausea and vomiting  - likely multifactorial and secondary to UTI imposed on hyperglycemia  - resolved by this AM  - supportive care  Blood loss anemia  - Hg and Hct stable since admission  - CBC in AM  Acute kidney injury  - likely of pre renal etiology  - creatinine is now within normal limits  - BMP in AM   Consultants:  None Procedures/Studies:  None Antibiotics:  Rocephin 11/29 --> 12/01  Levaquin 12/01 -->  Code Status: Full  Family Communication: Pt at bedside  Disposition Plan:  Home when medically stable    HPI/Subjective: No events overnight.   Objective: Filed Vitals:   07/08/12 1850 07/08/12 2200 07/09/12 0600 07/09/12 0650  BP:  119/75 152/72   Pulse:  97 98   Temp: 102.4 F (39.1 C) 99.5 F (37.5 C) 102.5 F (39.2 C) 100.1 F (37.8 C)  TempSrc: Oral Oral Oral   Resp:  18 18   Height:      Weight:   91 kg (200 lb 9.9 oz)   SpO2:  93% 91%     Intake/Output Summary (Last 24 hours) at 07/09/12 1041 Last data filed at 07/09/12 0925  Gross per 24 hour  Intake    960 ml  Output      0 ml  Net    960 ml    Exam:   General:  Pt is alert, follows commands appropriately, not in acute distress  Cardiovascular: Regular rate and rhythm, S1/S2, no murmurs, no rubs, no gallops  Respiratory: Clear to auscultation bilaterally, no wheezing, no crackles, no rhonchi  Abdomen: Soft, non tender, non distended, bowel sounds present, no guarding  Extremities: No edema, pulses DP and PT palpable bilaterally  Neuro: Grossly nonfocal  Data Reviewed: Basic Metabolic Panel:  Lab 07/09/12 5784 07/08/12 0516 07/07/12 1442 07/07/12 0228 07/06/12 1514  NA 133* 133* 134* 138 134*  K 3.8 3.9 4.3 2.8* 3.6  CL 98 98 101 101 93*  CO2 23 25 27 27 23   GLUCOSE 371* 309* 334* 72 314*  BUN 8 11 13 13 14   CREATININE 0.91 1.11*  0.99 0.94 0.91  CALCIUM 8.4 8.3* 8.3* 8.1* 8.8  MG -- -- -- -- --  PHOS -- -- -- -- --   Liver Function Tests:  Lab 07/07/12 0228 07/06/12 1514  AST 15 23  ALT 9 12  ALKPHOS 122* 148*  BILITOT 0.2* 0.3  PROT 5.5* 6.4  ALBUMIN 2.2* 2.6*   CBC:  Lab 07/09/12 0500 07/08/12 0516 07/07/12 0228 07/06/12 1514  WBC 3.5* 3.6* 5.0 7.0  NEUTROABS -- -- -- 5.9  HGB 9.0* 9.7* 9.2* 10.7*  HCT 27.7* 30.8* 28.1* 32.1*  MCV 76.7* 78.0 76.8* 76.1*  PLT 174 193 133* 202   Cardiac Enzymes:  Lab 07/07/12 0941 07/07/12 0228 07/06/12 2034  CKTOTAL -- -- --  CKMB -- -- --  CKMBINDEX -- -- --  TROPONINI <0.30 <0.30 <0.30   CBG:  Lab 07/09/12  0735 07/08/12 2131 07/08/12 1630 07/08/12 1127 07/08/12 1109  GLUCAP 330* 245* 272* 325* 388*    Recent Results (from the past 240 hour(s))  URINE CULTURE     Status: Normal   Collection Time   07/06/12  3:01 PM      Component Value Range Status Comment   Specimen Description URINE, CLEAN CATCH   Final    Special Requests NONE   Final    Culture  Setup Time 07/07/2012 01:32   Final    Colony Count >=100,000 COLONIES/ML   Final    Culture ESCHERICHIA COLI   Final    Report Status 07/09/2012 FINAL   Final    Organism ID, Bacteria ESCHERICHIA COLI   Final   CULTURE, BLOOD (ROUTINE X 2)     Status: Normal (Preliminary result)   Collection Time   07/06/12  8:30 PM      Component Value Range Status Comment   Specimen Description BLOOD LEFT ARM   Final    Special Requests BOTTLES DRAWN AEROBIC AND ANAEROBIC 3.5CC   Final    Culture  Setup Time 07/07/2012 01:35   Final    Culture     Final    Value:        BLOOD CULTURE RECEIVED NO GROWTH TO DATE CULTURE WILL BE HELD FOR 5 DAYS BEFORE ISSUING A FINAL NEGATIVE REPORT   Report Status PENDING   Incomplete   CULTURE, BLOOD (ROUTINE X 2)     Status: Normal (Preliminary result)   Collection Time   07/06/12  8:33 PM      Component Value Range Status Comment   Specimen Description BLOOD RIGHT HAND   Final    Special Requests BOTTLES DRAWN AEROBIC ONLY 3CC   Final    Culture  Setup Time 07/07/2012 01:35   Final    Culture     Final    Value:        BLOOD CULTURE RECEIVED NO GROWTH TO DATE CULTURE WILL BE HELD FOR 5 DAYS BEFORE ISSUING A FINAL NEGATIVE REPORT   Report Status PENDING   Incomplete      Scheduled Meds:   . colesevelam  1,250 mg Oral BID WC  . gabapentin  300 mg Oral TID  . insulin aspart  0-15 Units Subcutaneous TID WC  . insulin glargine  25 Units Subcutaneous q morning - 10a  . levofloxacin  IV  500 mg Intravenous Q24H  . levothyroxine  125 mcg Oral QAC breakfast  . metoprolol succinate  100 mg Oral Daily  . multivitamin    1 tablet Oral Daily  . pantoprazole  80  mg Oral BID AC  . venlafaxine XR  37.5 mg Oral Daily   Continuous Infusions:   . [DISCONTINUED] sodium chloride 75 mL/hr at 07/08/12 0715     Debbora Presto, MD  [facility] Pager [phone]  If 7PM-7AM, please contact night-coverage [email or web address] Password [email or web address] 07/09/2012, 10:41 AM   LOS: 3 days

## 2012-07-10 LAB — CBC
MCV: 76.5 fL — ABNORMAL LOW (ref 78.0–100.0)
Platelets: 215 10*3/uL (ref 150–400)
RBC: 3.74 MIL/uL — ABNORMAL LOW (ref 3.87–5.11)
WBC: 3.2 10*3/uL — ABNORMAL LOW (ref 4.0–10.5)

## 2012-07-10 LAB — GLUCOSE, CAPILLARY
Glucose-Capillary: 199 mg/dL — ABNORMAL HIGH (ref 70–99)
Glucose-Capillary: 300 mg/dL — ABNORMAL HIGH (ref 70–99)
Glucose-Capillary: 189 mg/dL — ABNORMAL HIGH (ref 70–99)

## 2012-07-10 LAB — BASIC METABOLIC PANEL
CO2: 27 mEq/L (ref 19–32)
Chloride: 100 mEq/L (ref 96–112)
Creatinine, Ser: 1.01 mg/dL (ref 0.50–1.10)
GFR calc Af Amer: 68 mL/min — ABNORMAL LOW (ref >90)
Potassium: 3.3 mEq/L — ABNORMAL LOW (ref 3.5–5.1)

## 2012-07-10 MED ORDER — HYDROCODONE-ACETAMINOPHEN 5-325 MG PO TABS: 1.0000 | ORAL_TABLET | ORAL | Status: DC | PRN

## 2012-07-10 MED ORDER — LEVOFLOXACIN 500 MG PO TABS
500.0000 mg | ORAL_TABLET | Freq: Every day | ORAL | Status: DC
  Filled 2012-07-10: qty 1

## 2012-07-10 MED ORDER — LEVOFLOXACIN 500 MG PO TABS: 500.0000 mg | ORAL_TABLET | Freq: Every day | ORAL | Status: DC

## 2012-07-10 MED ORDER — POTASSIUM CHLORIDE CRYS ER 20 MEQ PO TBCR
20.0000 meq | EXTENDED_RELEASE_TABLET | Freq: Once | ORAL | Status: AC
  Administered 2012-07-10: 20 meq via ORAL
  Filled 2012-07-10: qty 1

## 2012-07-10 NOTE — Discharge Summary (Signed)
Physician Discharge Summary  [name]:[id number] DOB: 04-30-1951 DOA: 07/06/2012  PCP: [name], MD  Admit date: 07/06/2012 Discharge date: 07/10/2012  Recommendations for Outpatient Follow-up:  1. Pt will need to follow up with PCP in 2-3 weeks post discharge 2. Please obtain BMP to evaluate electrolytes and kidney function, potassium level and supplement as indicated  3. Please also check CBC to evaluate Hg and Hct levels 4. Please note that pt was discharged on Levaquin PO to complete the therapy for 10 more days 5. In addition, pt was advised to follow up with urologist and the [facility] will call her for the appointment time and date 6. Please see the CT abd/pelvis findings below  7. Please note pt was given one dose or PO K-dur prior to discharge   Discharge Diagnoses: UTI Principal Problem:  *UTI (lower urinary tract infection) Active Problems:  Type II diabetes mellitus  Hypothyroidism  Hypertension  Epigastric abdominal pain  Intractable nausea and vomiting  Blood loss anemia  Acute kidney injury  Discharge Condition: Stable  Diet recommendation: Heart healthy diet discussed in details   Brief narrative:  Pt is 61 yo female with past medical history of diabetes mellitus 2 with a last hemoglobin A1c of 11.2 last year, presented to [facility] ED with main concern of progressively worsening generalized abdominal pain, dull in nature and intermittent, occasionally radiating to lower bilateral abdominal quadrants, with no specific alleviating factors but aggravated by food, associated with nausea and non bloody vomiting, poor oral intake. Pt denied any specific urinary symptoms other than urinary frequency. Has had similar episodes in the past.   Principal Problem:  *UTI (lower urinary tract infection)  - clinically improving, fever resolved and pt feels ready for discharge home today - continue Levaquin as urine culture indicated sensitivity  Active  Problems:  Type II diabetes mellitus  - uncontrolled and with complications of neuropathy and gastroparesis  - A1C is 12  - pt reports compliance  - diabetic educator consult appreciated  - continue Lantus Hypothyroidism  - stable  - continue Synthroid  Hypertension  - continue to monitor vitals per floor protocol  Epigastric abdominal pain with nausea and vomiting  - likely multifactorial and secondary to UTI imposed on hyperglycemia  - resolved by this AM  - supportive care  Blood loss anemia  - Hg and Hct stable since admission  Acute kidney injury  - likely of pre renal etiology  - creatinine is now within normal limits   Consultants:  None Procedures/Studies:  Ct Abdomen Pelvis W Contrast 06/25/2012   There is a stable upper pole left renal calculus but no worrisome renal lesions or obstructing ureteral calculi.   No acute abdominal/pelvic findings, mass lesions or lymphadenopathy. Lumbar fusion changes from L3-S1 Acute/subacute compression fracture of T12.   Antibiotics:  Rocephin 11/29 --> 12/01  Levaquin 12/01 --> continue for 10 more days post discharge   Discharge Exam: Filed Vitals:   07/10/12 0600  BP: 139/72  Pulse: 79  Temp: 98.9 F (37.2 C)  Resp: 18   Filed Vitals:   07/09/12 1332 07/09/12 2200 07/10/12 0423 07/10/12 0600  BP: 135/59 158/83  139/72  Pulse: 76 80  79  Temp: 99 F (37.2 C) 99 F (37.2 C)  98.9 F (37.2 C)  TempSrc: Oral Oral  Oral  Resp: 20 18  18   Height:      Weight:   93.3 kg (205 lb 11 oz) 91.5 kg (201 lb  11.5 oz)  SpO2: 92% 92%  97%    General: Pt is alert, follows commands appropriately, not in acute distress Cardiovascular: Regular rate and rhythm, S1/S2 +, no murmurs, no rubs, no gallops Respiratory: Clear to auscultation bilaterally, no wheezing, no crackles, no rhonchi Abdominal: Soft, non tender, non distended, bowel sounds +, no guarding Extremities: no edema, no cyanosis, pulses palpable bilaterally DP and  PT Neuro: Grossly nonfocal  Discharge Instructions  Discharge Orders    Future Orders Please Complete By Expires   Diet - low sodium heart healthy      Increase activity slowly          Medication List     As of 07/10/2012 11:30 AM    TAKE these medications         acetaminophen 500 MG tablet   Commonly known as: TYLENOL   Take 1,000 mg by mouth every 6 (six) hours as needed. pain      CALCIUM 600 + D PO   Take 1 tablet by mouth daily.      colesevelam 625 MG tablet   Commonly known as: WELCHOL   Take 1,250 mg by mouth 2 (two) times daily with a meal.      cyclobenzaprine 10 MG tablet   Commonly known as: FLEXERIL   Take 10 mg by mouth 3 (three) times daily as needed. For muscle pain      esomeprazole 20 MG capsule   Commonly known as: NEXIUM   Take 20 mg by mouth as needed.      gabapentin 300 MG capsule   Commonly known as: NEURONTIN   Take 300 mg by mouth 3 (three) times daily.      HYDROcodone-acetaminophen 5-325 MG per tablet   Commonly known as: NORCO/VICODIN   Take 1-2 tablets by mouth every 4 (four) hours as needed. pain      insulin glargine 100 UNIT/ML injection   Commonly known as: LANTUS   Inject 35 Units into the skin every morning.      insulin lispro 100 UNIT/ML injection   Commonly known as: HUMALOG   Inject 15 Units into the skin 3 (three) times daily before meals. Take 10 u meal coverage plus SSI: If CBG < 70: Drink juice; CBG 70-120: 0 u; CBG 121-150: 3 u; CBG 151-200: 4 u; CBG 201-250: 7 u; CBG 251-300: 11 u;CBG 301-350: 15 u; CBG 351-400: 20 u; CBG > 400: call MD      levofloxacin 500 MG tablet   Commonly known as: LEVAQUIN   Take 1 tablet (500 mg total) by mouth daily.      levothyroxine 125 MCG tablet   Commonly known as: SYNTHROID, LEVOTHROID   Take 125 mcg by mouth daily.      losartan-hydrochlorothiazide 100-12.5 MG per tablet   Commonly known as: HYZAAR   Take 1 tablet by mouth daily.      metFORMIN 1000 MG tablet   Commonly  known as: GLUCOPHAGE   Take 1,000 mg by mouth 2 (two) times daily with a meal.      metoprolol succinate 100 MG 24 hr tablet   Commonly known as: TOPROL-XL   Take 100 mg by mouth daily.      multivitamin with minerals Tabs   Take 1 tablet by mouth daily.      promethazine 25 MG tablet   Commonly known as: PHENERGAN   Take 25 mg by mouth every 6 (six) hours as needed. nausea  temazepam 30 MG capsule   Commonly known as: RESTORIL   Take 30 mg by mouth at bedtime as needed. For sleep      venlafaxine XR 37.5 MG 24 hr capsule   Commonly known as: EFFEXOR-XR   Take 37.5 mg by mouth daily.           Follow-up Information    Follow up with WHITE,CYNTHIA S, MD. In 2 weeks.   Contact information:   [address] [place] [address] [phone]       Follow up with [facility]. (they will call you tomorrow for appointment date and time )    Contact information:   [address] [place] [address] [place] [address] [phone]          The results of significant diagnostics from this hospitalization (including imaging, microbiology, ancillary and laboratory) are listed below for reference.     Microbiology: Recent Results (from the past 240 hour(s))  URINE CULTURE     Status: Normal   Collection Time   07/06/12  3:01 PM      Component Value Range Status Comment   Specimen Description URINE, CLEAN CATCH   Final    Special Requests NONE   Final    Culture  Setup Time 07/07/2012 01:32   Final    Colony Count >=100,000 COLONIES/ML   Final    Culture ESCHERICHIA COLI   Final    Report Status 07/09/2012 FINAL   Final    Organism ID, Bacteria ESCHERICHIA COLI   Final   CULTURE, BLOOD (ROUTINE X 2)     Status: Normal (Preliminary result)   Collection Time   07/06/12  8:30 PM      Component Value Range Status Comment   Specimen Description BLOOD LEFT ARM   Final    Special Requests BOTTLES DRAWN AEROBIC AND ANAEROBIC 3.5CC   Final    Culture  Setup Time  07/07/2012 01:35   Final    Culture     Final    Value:        BLOOD CULTURE RECEIVED NO GROWTH TO DATE CULTURE WILL BE HELD FOR 5 DAYS BEFORE ISSUING A FINAL NEGATIVE REPORT   Report Status PENDING   Incomplete   CULTURE, BLOOD (ROUTINE X 2)     Status: Normal (Preliminary result)   Collection Time   07/06/12  8:33 PM      Component Value Range Status Comment   Specimen Description BLOOD RIGHT HAND   Final    Special Requests BOTTLES DRAWN AEROBIC ONLY 3CC   Final    Culture  Setup Time 07/07/2012 01:35   Final    Culture     Final    Value:        BLOOD CULTURE RECEIVED NO GROWTH TO DATE CULTURE WILL BE HELD FOR 5 DAYS BEFORE ISSUING A FINAL NEGATIVE REPORT   Report Status PENDING   Incomplete      Labs: Basic Metabolic Panel:  Lab 07/10/12 2841 07/09/12 0500 07/08/12 0516 07/07/12 1442 07/07/12 0228  NA 137 133* 133* 134* 138  K 3.3* 3.8 3.9 4.3 2.8*  CL 100 98 98 101 101  CO2 27 23 25 27 27   GLUCOSE 211* 371* 309* 334* 72  BUN 9 8 11 13 13   CREATININE 1.01 0.91 1.11* 0.99 0.94  CALCIUM 8.8 8.4 8.3* 8.3* 8.1*  MG -- -- -- -- --  PHOS -- -- -- -- --   Liver Function  Tests:  Lab 07/07/12 0228 07/06/12 1514  AST 15 23  ALT 9 12  ALKPHOS 122* 148*  BILITOT 0.2* 0.3  PROT 5.5* 6.4  ALBUMIN 2.2* 2.6*   CBC:  Lab 07/10/12 0510 07/09/12 0500 07/08/12 0516 07/07/12 0228 07/06/12 1514  WBC 3.2* 3.5* 3.6* 5.0 7.0  NEUTROABS -- -- -- -- 5.9  HGB 9.2* 9.0* 9.7* 9.2* 10.7*  HCT 28.6* 27.7* 30.8* 28.1* 32.1*  MCV 76.5* 76.7* 78.0 76.8* 76.1*  PLT 215 174 193 133* 202   Cardiac Enzymes:  Lab 07/07/12 0941 07/07/12 0228 07/06/12 2034  CKTOTAL -- -- --  CKMB -- -- --  CKMBINDEX -- -- --  TROPONINI <0.30 <0.30 <0.30   CBG:  Lab 07/10/12 0745 07/10/12 0412 07/09/12 2341 07/09/12 2004 07/09/12 1637  GLUCAP 189* 199* 300* 397* 306*     SIGNED: Time coordinating discharge: Over 30 minutes  Debbora Presto, MD  [facility] 07/10/2012, 11:30 AM Pager  [phone]  If 7PM-7AM, please contact night-coverage [email or web address] Password [email or web address]

## 2012-07-10 NOTE — Progress Notes (Signed)
Inpatient Diabetes Program Recommendations  AACE/ADA: New Consensus Statement on Inpatient Glycemic Control (2013)  Target Ranges:  Prepandial:   less than 140 mg/dL      Peak postprandial:   less than 180 mg/dL (1-2 hours)      Critically ill patients:  140 - 180 mg/dL   Reason for Visit: Hyperglycemia  61 year old WF with past medical history of diabetes mellitus 2 with a last hemoglobin A1c of 11.2 last year and epigastric abdominal pain, comes in for the above-mentioned symptoms. She also started having abdominal pain in the suprapubic area one day prior to admission. She relates she has not been able to tolerate anything by mouth in the last 24 hours.  Pt reports she has been checking her blood sugars at home and has taken her insulin as prescribed from previous hospital visit.   Results for DEBBY, CLYNE (MRN [id number]) as of 07/10/2012 10:21  Ref. Range 07/10/2012 05:10  Sodium Latest Range: 135-145 mEq/L 137  Potassium Latest Range: 3.5-5.1 mEq/L 3.3 (L)  Chloride Latest Range: 96-112 mEq/L 100  CO2 Latest Range: 19-32 mEq/L 27  BUN Latest Range: 6-23 mg/dL 9  Creatinine Latest Range: 0.50-1.10 mg/dL 4.09  Calcium Latest Range: 8.4-10.5 mg/dL 8.8  GFR calc non Af Amer Latest Range: >90 mL/min 58 (L)  GFR calc Af Amer Latest Range: >90 mL/min 68 (L)  Glucose Latest Range: 70-99 mg/dL 811 (H)  WBC Latest Range: 4.0-10.5 K/uL 3.2 (L)  RBC Latest Range: 3.87-5.11 MIL/uL 3.74 (L)  Hemoglobin Latest Range: 12.0-15.0 g/dL 9.2 (L)  HCT Latest Range: 36.0-46.0 % 28.6 (L)  MCV Latest Range: 78.0-100.0 fL 76.5 (L)  MCH Latest Range: 26.0-34.0 pg 24.6 (L)  MCHC Latest Range: 30.0-36.0 g/dL 91.4  RDW Latest Range: 11.5-15.5 % 14.6  Platelets Latest Range: 150-400 K/uL 215  Results for HIROMI, KNODEL (MRN [id number]) as of 07/10/2012 10:21  Ref. Range 07/06/2012 20:34  Hemoglobin A1C Latest Range: <5.7 % 12.0 (H)  Results for NAVI, EWTON (MRN [id number]) as of 07/10/2012 10:21  Ref. Range  07/08/2012 21:31 07/09/2012 07:35 07/09/2012 11:34 07/09/2012 16:37 07/09/2012 20:04 07/09/2012 23:41 07/10/2012 04:12 07/10/2012 07:45  Glucose-Capillary Latest Range: 70-99 mg/dL 629 (H) 528 (H) 413 (H) 306 (H) 397 (H) 300 (H) 199 (H) 189 (H)      Inpatient Diabetes Program Recommendations Insulin - Basal: Increased to home dose of Lantus 35 units QD Correction (SSI): Change to Novolog mod tidwc and hs Insulin - Meal Coverage: Add Novolog 6 units tidwc for meal coverage insulin - Pt is on Novolog 10 - 12 units tidwc at home.  Note: Pt verbalized no problems injecting insulin.  Checks blood sugars 3 - 4 times/day.  Received education on previous visit.  Reinforced sick day rules with pt.  Will continue to follow.

## 2012-07-10 NOTE — Progress Notes (Signed)
ANTIBIOTIC CONSULT NOTE - FOLLOW UP  Pharmacy Consult for Levaquin Indication: E.coli UTI  Allergies  Allergen Reactions  . Lipitor (Atorvastatin) Itching  . Lunesta (Eszopiclone) Other (See Comments)    dizzy  . Ambien (Zolpidem Tartrate) Other (See Comments)    Up sleep walking and eating  . Codeine Nausea And Vomiting  . Crestor (Rosuvastatin) Other (See Comments)    Aching all over  . Morphine And Related Other (See Comments)    Flushing and feeling hot  . Penicillins Rash   Labs:  Basename 07/10/12 0510 07/09/12 0500 07/08/12 0516 07/07/12 0959  WBC 3.2* 3.5* 3.6* --  HGB 9.2* 9.0* 9.7* --  PLT 215 174 193 --  LABCREA -- -- -- 100.6  CREATININE 1.01 0.91 1.11* --    Assessment:  61 yof previously on Ceftriaxone for UTI, this was changed to Levaquin 12/01 given continued fevers.  Today is D#3 of Levaquin 500 mg IV q24h.  Urine cultures growing E.coli sensitive to fluoroquinolones.    Pt now afebrile, WBC low at 3.2K, Scr stable with CrCl 64 ml/min.    Plan:   Change Levaquin to 500 mg PO q24h  Pharmacy will f/u.    Geoffry Paradise, PharmD, BCPS Pager: [phone] 9:23 AM Pharmacy #: [phone]

## 2012-07-13 LAB — CULTURE, BLOOD (ROUTINE X 2)
Culture: NO GROWTH
Culture: NO GROWTH

## 2012-07-30 ENCOUNTER — Emergency Department (HOSPITAL_COMMUNITY)
Admission: EM | Admit: 2012-07-30 | Discharge: 2012-07-31 | Disposition: A | Discharge status: Home / Self Care | Payer: 59 | Attending: Emergency Medicine | Admitting: Emergency Medicine

## 2012-07-30 ENCOUNTER — Encounter (HOSPITAL_COMMUNITY): Payer: Self-pay | Admitting: *Deleted

## 2012-07-30 DIAGNOSIS — Z8719 Personal history of other diseases of the digestive system: Secondary | ICD-10-CM | POA: Insufficient documentation

## 2012-07-30 DIAGNOSIS — R51 Headache: Secondary | ICD-10-CM | POA: Insufficient documentation

## 2012-07-30 DIAGNOSIS — IMO0001 Reserved for inherently not codable concepts without codable children: Secondary | ICD-10-CM | POA: Insufficient documentation

## 2012-07-30 DIAGNOSIS — Z7982 Long term (current) use of aspirin: Secondary | ICD-10-CM | POA: Insufficient documentation

## 2012-07-30 DIAGNOSIS — I10 Essential (primary) hypertension: Secondary | ICD-10-CM | POA: Insufficient documentation

## 2012-07-30 DIAGNOSIS — Z8659 Personal history of other mental and behavioral disorders: Secondary | ICD-10-CM | POA: Insufficient documentation

## 2012-07-30 DIAGNOSIS — M549 Dorsalgia, unspecified: Secondary | ICD-10-CM | POA: Insufficient documentation

## 2012-07-30 DIAGNOSIS — K219 Gastro-esophageal reflux disease without esophagitis: Secondary | ICD-10-CM | POA: Insufficient documentation

## 2012-07-30 DIAGNOSIS — E039 Hypothyroidism, unspecified: Secondary | ICD-10-CM | POA: Insufficient documentation

## 2012-07-30 DIAGNOSIS — Z79899 Other long term (current) drug therapy: Secondary | ICD-10-CM | POA: Insufficient documentation

## 2012-07-30 DIAGNOSIS — Z87440 Personal history of urinary (tract) infections: Secondary | ICD-10-CM | POA: Insufficient documentation

## 2012-07-30 DIAGNOSIS — R739 Hyperglycemia, unspecified: Secondary | ICD-10-CM

## 2012-07-30 DIAGNOSIS — R509 Fever, unspecified: Secondary | ICD-10-CM | POA: Insufficient documentation

## 2012-07-30 DIAGNOSIS — Z8739 Personal history of other diseases of the musculoskeletal system and connective tissue: Secondary | ICD-10-CM | POA: Insufficient documentation

## 2012-07-30 DIAGNOSIS — E1169 Type 2 diabetes mellitus with other specified complication: Secondary | ICD-10-CM | POA: Insufficient documentation

## 2012-07-30 DIAGNOSIS — R112 Nausea with vomiting, unspecified: Secondary | ICD-10-CM | POA: Insufficient documentation

## 2012-07-30 LAB — COMPREHENSIVE METABOLIC PANEL
ALT: 7 U/L (ref 0–35)
AST: 12 U/L (ref 0–37)
CO2: 19 mEq/L (ref 19–32)
Chloride: 98 mEq/L (ref 96–112)
GFR calc Af Amer: >90 mL/min (ref >90)
GFR calc non Af Amer: 87 mL/min — ABNORMAL LOW (ref >90)
Glucose, Bld: 367 mg/dL — ABNORMAL HIGH (ref 70–99)
Sodium: 135 mEq/L (ref 135–145)
Total Bilirubin: 0.5 mg/dL (ref 0.3–1.2)

## 2012-07-30 LAB — CBC WITH DIFFERENTIAL/PLATELET
Basophils Absolute: 0 10*3/uL (ref 0.0–0.1)
Eosinophils Relative: 0 % (ref 0–5)
HCT: 36.5 % (ref 36.0–46.0)
Lymphocytes Relative: 10 % — ABNORMAL LOW (ref 12–46)
Lymphs Abs: 0.5 10*3/uL — ABNORMAL LOW (ref 0.7–4.0)
MCV: 75.9 fL — ABNORMAL LOW (ref 78.0–100.0)
Monocytes Absolute: 0.3 10*3/uL (ref 0.1–1.0)
Neutro Abs: 4 10*3/uL (ref 1.7–7.7)
Platelets: 242 10*3/uL (ref 150–400)
RBC: 4.81 MIL/uL (ref 3.87–5.11)
RDW: 14.3 % (ref 11.5–15.5)
WBC: 4.7 10*3/uL (ref 4.0–10.5)

## 2012-07-30 LAB — URINALYSIS, ROUTINE W REFLEX MICROSCOPIC
Hgb urine dipstick: NEGATIVE
Ketones, ur: >80 mg/dL — AB
Nitrite: NEGATIVE
Protein, ur: NEGATIVE mg/dL
Urobilinogen, UA: 0.2 mg/dL (ref 0.0–1.0)

## 2012-07-30 LAB — URINE MICROSCOPIC-ADD ON

## 2012-07-30 MED ORDER — INSULIN REGULAR HUMAN 100 UNIT/ML IJ SOLN: 5.0000 [IU] | Freq: Once | INTRAMUSCULAR | Status: DC

## 2012-07-30 MED ORDER — ONDANSETRON HCL 4 MG/2ML IJ SOLN
4.0000 mg | Freq: Once | INTRAMUSCULAR | Status: AC
  Administered 2012-07-30: 4 mg via INTRAVENOUS
  Filled 2012-07-30: qty 2

## 2012-07-30 MED ORDER — METOCLOPRAMIDE HCL 5 MG/ML IJ SOLN
10.0000 mg | Freq: Once | INTRAMUSCULAR | Status: AC
  Administered 2012-07-30: 10 mg via INTRAVENOUS
  Filled 2012-07-30: qty 2

## 2012-07-30 MED ORDER — INSULIN ASPART 100 UNIT/ML ~~LOC~~ SOLN
5.0000 [IU] | Freq: Once | SUBCUTANEOUS | Status: AC
  Administered 2012-07-30: 5 [IU] via SUBCUTANEOUS
  Filled 2012-07-30: qty 1

## 2012-07-30 MED ORDER — SODIUM CHLORIDE 0.9 % IV BOLUS (SEPSIS)
1000.0000 mL | Freq: Once | INTRAVENOUS | Status: AC
  Administered 2012-07-30: 1000 mL via INTRAVENOUS

## 2012-07-30 MED ORDER — HYDROMORPHONE HCL PF 1 MG/ML IJ SOLN
1.0000 mg | Freq: Once | INTRAMUSCULAR | Status: AC
  Administered 2012-07-30: 1 mg via INTRAVENOUS
  Filled 2012-07-30: qty 1

## 2012-07-30 NOTE — ED Notes (Signed)
Pt presents c/o lower abd pain and lower back pain, reports recent hospitalization for UTI. Pt also reports hyperglycemia at home. And vomiting.

## 2012-07-30 NOTE — ED Provider Notes (Signed)
History     CSN: [id number]  Arrival date & time 07/30/12  2109   First MD Initiated Contact with Patient 07/30/12 2225      Chief Complaint  Patient presents with  . Abdominal Pain  . Back Pain   HPI  History provided by the patient and son. Patient is a 61 year old female with history of hypertension, diabetes, hypothyroidism, lumbar fusions and chronic back pains who presents with complaints of acute onset nausea vomiting this morning followed by diffuse body aches. Patient's son reports recently being ill with viral stomach illness with diarrhea symptoms. Patient denies having any diarrhea just vomiting. Symptoms have also been associated with elevated blood sugars today. She did take her normal dose of Lantus and Humalog. Patient also had recent hospitalization for hyperglycemia greater than 600 and UTI. She was treated and released one week ago and has been feeling well since that time. Denies any urinary complaints at this time. She denies any other aggravating or alleviating factors. Denies any other associated symptoms.    Past Medical History  Diagnosis Date  . Diabetes mellitus   . Hypertension   . Hypothyroidism   . PONV (postoperative nausea and vomiting)   . Blood transfusion   . UTI (lower urinary tract infection)   . GERD (gastroesophageal reflux disease)     occ  . Fibromyalgia   . Arthritis   . Depression     Past Surgical History  Procedure Date  . Cholecystectomy   . Appendectomy   . Orthopedic surgeries     multiple  . Cesarean section     x 2  . Knee arthroplasty 09    lft partial  . Knee arthroplasty     rt  . Shoulder open rotator cuff repair     rt and lft  . Breast surgery     breast reduction  . Dorsal compartment release 06/05/2012    Procedure: RELEASE DORSAL COMPARTMENT (DEQUERVAIN);  Surgeon: Wyn Forster., MD;  Location: [facility];  Service: Orthopedics;  Laterality: Left;  Excision of mixoid cyst also  .  Carpal tunnel release 06/05/2012    Procedure: CARPAL TUNNEL RELEASE;  Surgeon: Wyn Forster., MD;  Location: [facility];  Service: Orthopedics;  Laterality: Left;  . Back surgery ,2005    April  2013 - spinal fusion@ [facility]    Family History  Problem Relation Age of Onset  . Diabetes type II    . Hypothyroidism    . Heart attack Father     79s  . Diabetes type II Mother   . Hypertension Mother     History  Substance Use Topics  . Smoking status: Never Smoker   . Smokeless tobacco: Never Used  . Alcohol Use: No    OB History    Grav Para Term Preterm Abortions TAB SAB Ect Mult Living                  Review of Systems  Constitutional: Positive for fever, chills and appetite change.  Respiratory: Negative for cough.   Gastrointestinal: Positive for nausea and vomiting. Negative for diarrhea, constipation and blood in stool.  Genitourinary: Negative for dysuria, frequency, hematuria and flank pain.  Musculoskeletal: Positive for myalgias and back pain.  Neurological: Positive for headaches.  All other systems reviewed and are negative.    Allergies  Lipitor; Lunesta; Ambien; Codeine; Crestor; Morphine and related; and Penicillins  Home Medications   Current Outpatient  Rx  Name  Route  Sig  Dispense  Refill  . ACETAMINOPHEN 500 MG PO TABS   Oral   Take 1,000 mg by mouth every 6 (six) hours as needed. pain         . ASPIRIN EC 81 MG PO TBEC   Oral   Take 81 mg by mouth daily.         Marland Kitchen CALCIUM 600 + D PO   Oral   Take 1 tablet by mouth daily.          . COLESEVELAM HCL 625 MG PO TABS   Oral   Take 1,250 mg by mouth 2 (two) times daily with a meal.          . CYCLOBENZAPRINE HCL 10 MG PO TABS   Oral   Take 10 mg by mouth 3 (three) times daily as needed. For muscle pain         . ESOMEPRAZOLE MAGNESIUM 20 MG PO CPDR   Oral   Take 20 mg by mouth as needed. Ulcer/acid reflux         . GABAPENTIN 300 MG PO CAPS   Oral    Take 300 mg by mouth 3 (three) times daily.         Marland Kitchen HYDROCODONE-ACETAMINOPHEN 5-325 MG PO TABS   Oral   Take 1-2 tablets by mouth every 4 (four) hours as needed. pain   65 tablet   0   . INSULIN GLARGINE 100 UNIT/ML [place] SOLN   Subcutaneous   Inject 35 Units into the skin every morning.         . INSULIN LISPRO (HUMAN) 100 UNIT/ML [place] SOLN   Subcutaneous   Inject 10 Units into the skin 3 (three) times daily before meals. Take 10 u meal coverage plus SSI: If CBG < 70: Drink juice; CBG 70-120: 0 u; CBG 121-150: 3 u; CBG 151-200: 4 u; CBG 201-250: 7 u; CBG 251-300: 11 u;CBG 301-350: 15 u; CBG 351-400: 20 u; CBG > 400: call MD         . LEVOTHYROXINE SODIUM 125 MCG PO TABS   Oral   Take 125 mcg by mouth daily.          Marland Kitchen LOSARTAN POTASSIUM-HCTZ 100-12.5 MG PO TABS   Oral   Take 1 tablet by mouth daily.         Marland Kitchen METFORMIN HCL 1000 MG PO TABS   Oral   Take 1,000 mg by mouth 2 (two) times daily with a meal.         . METOPROLOL SUCCINATE ER 100 MG PO TB24   Oral   Take 100 mg by mouth daily.           . ADULT MULTIVITAMIN W/MINERALS CH   Oral   Take 1 tablet by mouth daily.           Marland Kitchen PROMETHAZINE HCL 25 MG PO TABS   Oral   Take 25 mg by mouth every 6 (six) hours as needed. nausea         . TEMAZEPAM 30 MG PO CAPS   Oral   Take 30 mg by mouth at bedtime as needed. For sleep         . VENLAFAXINE HCL ER 37.5 MG PO CP24   Oral   Take 37.5 mg by mouth daily.           BP 170/89  Pulse 130  Temp 100.3 F (37.9 C) (  Oral)  Resp 20  SpO2 98%  Physical Exam  Nursing note and vitals reviewed. Constitutional: She is oriented to person, place, and time. She appears well-developed and well-nourished. No distress.  HENT:  Head: Normocephalic.  Cardiovascular: Regular rhythm.  Tachycardia present.   Pulmonary/Chest: Effort normal and breath sounds normal. No respiratory distress. She has no wheezes. She has no rales.  Abdominal: Soft. She exhibits no  distension. There is tenderness. There is no rebound.       Very mild diffuse tenderness.  Neurological: She is alert and oriented to person, place, and time.  Skin: Skin is warm and dry.  Psychiatric: She has a normal mood and affect. Her behavior is normal.    ED Course  Procedures   Results for orders placed during the hospital encounter of 07/30/12  CBC WITH DIFFERENTIAL      Component Value Range   WBC 4.7  4.0 - 10.5 K/uL   RBC 4.81  3.87 - 5.11 MIL/uL   Hemoglobin 11.7 (*) 12.0 - 15.0 g/dL   HCT 62.1  30.8 - 65.7 %   MCV 75.9 (*) 78.0 - 100.0 fL   MCH 24.3 (*) 26.0 - 34.0 pg   MCHC 32.1  30.0 - 36.0 g/dL   RDW 84.6  96.2 - 95.2 %   Platelets 242  150 - 400 K/uL   Neutrophils Relative 84 (*) 43 - 77 %   Neutro Abs 4.0  1.7 - 7.7 K/uL   Lymphocytes Relative 10 (*) 12 - 46 %   Lymphs Abs 0.5 (*) 0.7 - 4.0 K/uL   Monocytes Relative 6  3 - 12 %   Monocytes Absolute 0.3  0.1 - 1.0 K/uL   Eosinophils Relative 0  0 - 5 %   Eosinophils Absolute 0.0  0.0 - 0.7 K/uL   Basophils Relative 0  0 - 1 %   Basophils Absolute 0.0  0.0 - 0.1 K/uL  COMPREHENSIVE METABOLIC PANEL      Component Value Range   Sodium 135  135 - 145 mEq/L   Potassium 3.7  3.5 - 5.1 mEq/L   Chloride 98  96 - 112 mEq/L   CO2 19  19 - 32 mEq/L   Glucose, Bld 367 (*) 70 - 99 mg/dL   BUN 11  6 - 23 mg/dL   Creatinine, Ser 8.41  0.50 - 1.10 mg/dL   Calcium 9.6  8.4 - 32.4 mg/dL   Total Protein 7.4  6.0 - 8.3 g/dL   Albumin 3.5  3.5 - 5.2 g/dL   AST 12  0 - 37 U/L   ALT 7  0 - 35 U/L   Alkaline Phosphatase 107  39 - 117 U/L   Total Bilirubin 0.5  0.3 - 1.2 mg/dL   GFR calc non Af Amer 87 (*) >90 mL/min   GFR calc Af Amer >90  >90 mL/min  URINALYSIS, ROUTINE W REFLEX MICROSCOPIC      Component Value Range   Color, Urine YELLOW  YELLOW   APPearance CLEAR  CLEAR   Specific Gravity, Urine 1.031 (*) 1.005 - 1.030   pH 5.5  5.0 - 8.0   Glucose, UA >1000 (*) NEGATIVE mg/dL   Hgb urine dipstick NEGATIVE   NEGATIVE   Bilirubin Urine MODERATE (*) NEGATIVE   Ketones, ur >80 (*) NEGATIVE mg/dL   Protein, ur NEGATIVE  NEGATIVE mg/dL   Urobilinogen, UA 0.2  0.0 - 1.0 mg/dL   Nitrite NEGATIVE  NEGATIVE  Leukocytes, UA NEGATIVE  NEGATIVE  GLUCOSE, CAPILLARY      Component Value Range   Glucose-Capillary 344 (*) 70 - 99 mg/dL   Comment 1 Documented in Chart    URINE MICROSCOPIC-ADD ON      Component Value Range   Squamous Epithelial / LPF FEW (*) RARE   WBC, UA 0-2  <3 WBC/hpf   Bacteria, UA RARE  RARE  GLUCOSE, CAPILLARY      Component Value Range   Glucose-Capillary 244 (*) 70 - 99 mg/dL   Comment 1 Documented in Chart     Comment 2 Notify RN    GLUCOSE, CAPILLARY      Component Value Range   Glucose-Capillary 160 (*) 70 - 99 mg/dL   Comment 1 Documented in Chart     Comment 2 Notify RN         No results found.   1. Nausea & vomiting   2. Hyperglycemia       MDM  10:20 PM patient seen and evaluated. Patient appears in mild-to-moderate discomfort but in no acute distress.   Patient continues to feel well. Blood sugar has improved significantly. Patient received several fluid boluses for rehydration. Heart rate has also improved significantly. At this time patient is requesting to return home. She has been instructed to followup with PCP and have close monitoring of her blood sugar levels.     Angus Seller, [place] 07/31/12 [phone]

## 2012-07-31 LAB — GLUCOSE, CAPILLARY
Glucose-Capillary: 160 mg/dL — ABNORMAL HIGH (ref 70–99)
Glucose-Capillary: 244 mg/dL — ABNORMAL HIGH (ref 70–99)

## 2012-07-31 MED ORDER — ONDANSETRON 8 MG PO TBDP: ORAL_TABLET | ORAL | Status: DC

## 2012-07-31 MED ORDER — SODIUM CHLORIDE 0.9 % IV BOLUS (SEPSIS)
1000.0000 mL | Freq: Once | INTRAVENOUS | Status: AC
  Administered 2012-07-31: 1000 mL via INTRAVENOUS

## 2012-08-01 NOTE — ED Provider Notes (Signed)
Medical screening examination/treatment/procedure(s) were performed by non-physician practitioner and as supervising physician I was immediately available for consultation/collaboration.  [name], MD 08/01/12 0016 

## 2012-11-22 ENCOUNTER — Other Ambulatory Visit: Payer: Self-pay | Admitting: Neurosurgery

## 2012-11-22 DIAGNOSIS — M8448XA Pathological fracture, other site, initial encounter for fracture: Secondary | ICD-10-CM

## 2012-11-22 DIAGNOSIS — M48061 Spinal stenosis, lumbar region without neurogenic claudication: Secondary | ICD-10-CM

## 2013-01-04 ENCOUNTER — Other Ambulatory Visit: Payer: Self-pay | Admitting: Obstetrics and Gynecology

## 2013-01-04 ENCOUNTER — Ambulatory Visit
Admission: RE | Admit: 2013-01-04 | Discharge: 2013-01-04 | Disposition: A | Discharge status: Home / Self Care | Payer: 59 | Source: Ambulatory Visit | Attending: Neurosurgery | Admitting: Neurosurgery

## 2013-01-04 ENCOUNTER — Ambulatory Visit
Admission: RE | Admit: 2013-01-04 | Discharge: 2013-01-04 | Disposition: A | Discharge status: Home / Self Care | Payer: 59 | Source: Ambulatory Visit | Attending: Obstetrics and Gynecology | Admitting: Obstetrics and Gynecology

## 2013-01-04 DIAGNOSIS — Z9889 Other specified postprocedural states: Secondary | ICD-10-CM

## 2013-01-04 DIAGNOSIS — M8448XA Pathological fracture, other site, initial encounter for fracture: Secondary | ICD-10-CM

## 2013-01-04 DIAGNOSIS — M48061 Spinal stenosis, lumbar region without neurogenic claudication: Secondary | ICD-10-CM

## 2013-01-04 DIAGNOSIS — Z1231 Encounter for screening mammogram for malignant neoplasm of breast: Secondary | ICD-10-CM

## 2013-02-28 ENCOUNTER — Encounter (HOSPITAL_COMMUNITY): Payer: Self-pay | Admitting: Emergency Medicine

## 2013-02-28 ENCOUNTER — Emergency Department (HOSPITAL_COMMUNITY): Payer: 59

## 2013-02-28 ENCOUNTER — Observation Stay (HOSPITAL_COMMUNITY)
Admission: EM | Admit: 2013-02-28 | Discharge: 2013-03-02 | Disposition: A | Discharge status: Home / Self Care | Payer: 59 | Attending: Internal Medicine | Admitting: Internal Medicine

## 2013-02-28 DIAGNOSIS — I359 Nonrheumatic aortic valve disorder, unspecified: Secondary | ICD-10-CM | POA: Insufficient documentation

## 2013-02-28 DIAGNOSIS — R079 Chest pain, unspecified: Secondary | ICD-10-CM

## 2013-02-28 DIAGNOSIS — R1310 Dysphagia, unspecified: Secondary | ICD-10-CM | POA: Diagnosis present

## 2013-02-28 DIAGNOSIS — IMO0001 Reserved for inherently not codable concepts without codable children: Secondary | ICD-10-CM | POA: Insufficient documentation

## 2013-02-28 DIAGNOSIS — R072 Precordial pain: Principal | ICD-10-CM | POA: Insufficient documentation

## 2013-02-28 DIAGNOSIS — K219 Gastro-esophageal reflux disease without esophagitis: Secondary | ICD-10-CM | POA: Insufficient documentation

## 2013-02-28 DIAGNOSIS — E119 Type 2 diabetes mellitus without complications: Secondary | ICD-10-CM | POA: Diagnosis present

## 2013-02-28 DIAGNOSIS — I079 Rheumatic tricuspid valve disease, unspecified: Secondary | ICD-10-CM | POA: Insufficient documentation

## 2013-02-28 DIAGNOSIS — I10 Essential (primary) hypertension: Secondary | ICD-10-CM | POA: Diagnosis present

## 2013-02-28 DIAGNOSIS — R0789 Other chest pain: Secondary | ICD-10-CM | POA: Diagnosis present

## 2013-02-28 DIAGNOSIS — E039 Hypothyroidism, unspecified: Secondary | ICD-10-CM | POA: Diagnosis present

## 2013-02-28 LAB — COMPREHENSIVE METABOLIC PANEL
ALT: 15 U/L (ref 0–35)
Albumin: 3.2 g/dL — ABNORMAL LOW (ref 3.5–5.2)
Alkaline Phosphatase: 129 U/L — ABNORMAL HIGH (ref 39–117)
Potassium: 4 mEq/L (ref 3.5–5.1)
Sodium: 136 mEq/L (ref 135–145)
Total Protein: 6.4 g/dL (ref 6.0–8.3)

## 2013-02-28 LAB — CBC
MCHC: 32.8 g/dL (ref 30.0–36.0)
RDW: 14.2 % (ref 11.5–15.5)

## 2013-02-28 LAB — POCT I-STAT TROPONIN I: Troponin i, poc: 0 ng/mL (ref 0.00–0.08)

## 2013-02-28 MED ORDER — KETOROLAC TROMETHAMINE 30 MG/ML IJ SOLN
30.0000 mg | Freq: Once | INTRAMUSCULAR | Status: AC
  Administered 2013-02-28: 30 mg via INTRAVENOUS
  Filled 2013-02-28: qty 1

## 2013-02-28 MED ORDER — HYDROMORPHONE HCL PF 1 MG/ML IJ SOLN
1.0000 mg | Freq: Once | INTRAMUSCULAR | Status: AC
  Administered 2013-02-28: 1 mg via INTRAVENOUS
  Filled 2013-02-28: qty 1

## 2013-02-28 NOTE — ED Provider Notes (Addendum)
Medical screening examination/treatment/procedure(s) were conducted as a shared visit with non-physician practitioner(s) and myself.  I personally evaluated the patient during the encounter  Pt with chest pain, multiple risk factors, no known CAD.   Messi Twedt B. Bernette Mayers, MD 02/28/13 [phone]

## 2013-02-28 NOTE — ED Notes (Signed)
Pt brought to ED by EMS with central chest pain radiating to the left side.Pt says it started this morning.Nitro X 1 was given by EMS but no change in pain level.Asprin 324 and Zofran 4mg  was given by EMS.

## 2013-02-28 NOTE — ED Provider Notes (Signed)
CSN: [id number]     Arrival date & time 02/28/13  2009 History     First MD Initiated Contact with Patient 02/28/13 2013     No chief complaint on file.  (Consider location/radiation/quality/duration/timing/severity/associated sxs/prior Treatment) HPI  [name] is a 62 y.o.female with a significant PMH of substernal CP since she woke up this morning that radiates to the left arm with nausea. She denies ever having chest pains before. She has seen [name] with [facility] physicians for a heart murmur a few years ago but the cleared her. Otherwise she has not had a cath or CABG. She had a stress test a few years ago. She was given aspirin and nitro by EMS but it did not help.    Chest Pain  Pain location: substernal Pain quality:  pressure Radiates to: left arm Pain severity: 10/10 but intermittent Onset quality: woke up to pain Timing: waxing and waning Progression: constant Relieved by:  nothing Associated symptoms:nausea and diaphoresis    Past Medical History  Diagnosis Date  . Diabetes mellitus   . Hypertension   . Hypothyroidism   . PONV (postoperative nausea and vomiting)   . Blood transfusion   . UTI (lower urinary tract infection)   . GERD (gastroesophageal reflux disease)     occ  . Fibromyalgia   . Arthritis   . Depression    Past Surgical History  Procedure Laterality Date  . Cholecystectomy    . Appendectomy    . Orthopedic surgeries      multiple  . Cesarean section      x 2  . Knee arthroplasty  09    lft partial  . Knee arthroplasty      rt  . Shoulder open rotator cuff repair      rt and lft  . Breast surgery      breast reduction  . Dorsal compartment release  06/05/2012    Procedure: RELEASE DORSAL COMPARTMENT (DEQUERVAIN);  Surgeon: Wyn Forster., MD;  Location: [facility];  Service: Orthopedics;  Laterality: Left;  Excision of mixoid cyst also  . Carpal tunnel release  06/05/2012    Procedure: CARPAL TUNNEL  RELEASE;  Surgeon: Wyn Forster., MD;  Location: [facility];  Service: Orthopedics;  Laterality: Left;  . Back surgery  ,2005    April  2013 - [facility]   Family History  Problem Relation Age of Onset  . Diabetes type II    . Hypothyroidism    . Heart attack Father     71s  . Diabetes type II Mother   . Hypertension Mother    History  Substance Use Topics  . Smoking status: Never Smoker   . Smokeless tobacco: Never Used  . Alcohol Use: No   OB History   Grav Para Term Preterm Abortions TAB SAB Ect Mult Living                 Review of Systems  Cardiovascular: Positive for chest pain.  Gastrointestinal: Positive for nausea.  All other systems reviewed and are negative.     Allergies  Ambien; Crestor; Lipitor; Morphine and related; Statins; Codeine; Lunesta; and Penicillins  Home Medications   Current Outpatient Rx  Name  Route  Sig  Dispense  Refill  . acetaminophen (TYLENOL) 500 MG tablet   Oral   Take 1,000 mg by mouth every 6 (six) hours as needed. pain         .  aspirin EC 81 MG tablet   Oral   Take 81 mg by mouth daily.         . Calcium Carbonate-Vitamin D (CALCIUM 600 + D PO)   Oral   Take 1 tablet by mouth daily.          . colesevelam (WELCHOL) 625 MG tablet   Oral   Take 1,250 mg by mouth 2 (two) times daily with a meal.          . cyclobenzaprine (FLEXERIL) 10 MG tablet   Oral   Take 10 mg by mouth 3 (three) times daily as needed. For muscle pain         . diphenhydrAMINE (BENADRYL) 25 MG tablet   Oral   Take 25 mg by mouth every 6 (six) hours as needed for allergies.         Marland Kitchen esomeprazole (NEXIUM) 20 MG capsule   Oral   Take 20 mg by mouth daily as needed (ulcer / acid reflux).          . gabapentin (NEURONTIN) 300 MG capsule   Oral   Take 900 mg by mouth at bedtime.          Marland Kitchen HYDROcodone-acetaminophen (NORCO/VICODIN) 5-325 MG per tablet   Oral   Take 1 tablet by mouth every 4 (four)  hours as needed for pain.         Marland Kitchen insulin glargine (LANTUS) 100 UNIT/ML injection   Subcutaneous   Inject 42 Units into the skin every morning.          . insulin lispro (HUMALOG) 100 UNIT/ML injection   Subcutaneous   Inject 10 Units into the skin 3 (three) times daily before meals. Take 10 u meal coverage plus SSI: If CBG < 70: Drink juice; CBG 70-120: 0 u; CBG 121-150: 3 u; CBG 151-200: 4 u; CBG 201-250: 7 u; CBG 251-300: 11 u;CBG 301-350: 15 u; CBG 351-400: 20 u; CBG > 400: call MD         . levothyroxine (SYNTHROID, LEVOTHROID) 125 MCG tablet   Oral   Take 125 mcg by mouth daily.          . meloxicam (MOBIC) 7.5 MG tablet   Oral   Take 7.5 mg by mouth 2 (two) times daily as needed for pain.          . metFORMIN (GLUCOPHAGE) 1000 MG tablet   Oral   Take 1,000 mg by mouth 2 (two) times daily with a meal.         . Multiple Vitamin (MULITIVITAMIN WITH MINERALS) TABS   Oral   Take 1 tablet by mouth daily.           . ondansetron (ZOFRAN-ODT) 8 MG disintegrating tablet   Oral   Take 8 mg by mouth every 4 (four) hours as needed for nausea.         . promethazine (PHENERGAN) 25 MG tablet   Oral   Take 25 mg by mouth every 6 (six) hours as needed. nausea         . temazepam (RESTORIL) 30 MG capsule   Oral   Take 30 mg by mouth at bedtime as needed for sleep.          . valsartan-hydrochlorothiazide (DIOVAN-HCT) 320-12.5 MG per tablet   Oral   Take 1 tablet by mouth daily.         Marland Kitchen venlafaxine (EFFEXOR-XR) 37.5 MG 24 hr capsule   Oral  Take 37.5 mg by mouth daily.         . Vitamin D, Ergocalciferol, (DRISDOL) 50000 UNITS CAPS   Oral   Take 50,000 Units by mouth See admin instructions. Filled on 01/30/13, takes every other week          BP 159/73  Pulse 75  Temp(Src) 98.1 F (36.7 C) (Axillary)  Resp 16  SpO2 98% Physical Exam  Nursing note and vitals reviewed. Constitutional: She appears well-developed and well-nourished. No  distress.  HENT:  Head: Normocephalic and atraumatic.  Eyes: Pupils are equal, round, and reactive to light.  Neck: Normal range of motion. Neck supple.  Cardiovascular: Normal rate and regular rhythm.   Pulmonary/Chest: Effort normal.  Abdominal: Soft.  Neurological: She is alert.  Skin: Skin is warm and dry.    ED Course   Procedures (including critical care time)  Labs Reviewed  CBC - Abnormal; Notable for the following:    Hemoglobin 10.4 (*)    HCT 31.7 (*)    MCV 73.7 (*)    MCH 24.2 (*)    All other components within normal limits  COMPREHENSIVE METABOLIC PANEL - Abnormal; Notable for the following:    Glucose, Bld 254 (*)    Albumin 3.2 (*)    Alkaline Phosphatase 129 (*)    GFR calc non Af Amer 75 (*)    GFR calc Af Amer 87 (*)    All other components within normal limits  POCT I-STAT TROPONIN I   Dg Chest 2 View  02/28/2013   *RADIOLOGY REPORT*  Clinical Data: Chest pain  CHEST - 2 VIEW  Comparison: None.  Findings: Normal mediastinum and cardiac silhouette.  Normal pulmonary  vasculature.  No evidence of effusion, infiltrate, or pneumothorax.  No acute bony abnormality. Degenerative osteophytosis of the thoracic spine.  IMPRESSION: No acute cardiopulmonary process.   Original Report Authenticated By: Genevive Bi, M.D.   1. Chest pain     MDM  Negative troponin and chest xray is unremarkable. Due to risk factors patient meets criteria for admission and cardiac r/o. Discussed with attending who agrees.   Date: 02/28/2013  Rate: 72  Rhythm: sinus rhythm  QRS Axis: normal  Intervals: normal  ST/T Wave abnormalities: normal  Conduction Disutrbances:none  Narrative Interpretation:   Old EKG Reviewed: unchanged from 07/06/2012   Admitted to [facility], Tele, obs  Dorthula Matas, PA-C 02/28/13 2248

## 2013-03-01 DIAGNOSIS — E119 Type 2 diabetes mellitus without complications: Secondary | ICD-10-CM

## 2013-03-01 DIAGNOSIS — R0789 Other chest pain: Secondary | ICD-10-CM

## 2013-03-01 LAB — GLUCOSE, CAPILLARY
Glucose-Capillary: 233 mg/dL — ABNORMAL HIGH (ref 70–99)
Glucose-Capillary: 339 mg/dL — ABNORMAL HIGH (ref 70–99)
Glucose-Capillary: 389 mg/dL — ABNORMAL HIGH (ref 70–99)

## 2013-03-01 LAB — TROPONIN I: Troponin I: <0.3 ng/mL (ref <0.30)

## 2013-03-01 LAB — TSH: TSH: 10.751 u[IU]/mL — ABNORMAL HIGH (ref 0.350–4.500)

## 2013-03-01 LAB — HEMOGLOBIN A1C
Hgb A1c MFr Bld: 10.5 % — ABNORMAL HIGH (ref <5.7)
Mean Plasma Glucose: 255 mg/dL — ABNORMAL HIGH (ref <117)

## 2013-03-01 MED ORDER — LORAZEPAM 2 MG/ML IJ SOLN: 1.0000 mg | INTRAMUSCULAR | Status: DC | PRN

## 2013-03-01 MED ORDER — IRBESARTAN 300 MG PO TABS
300.0000 mg | ORAL_TABLET | Freq: Every day | ORAL | Status: DC
  Administered 2013-03-01 – 2013-03-02 (×2): 300 mg via ORAL
  Filled 2013-03-01 (×2): qty 1

## 2013-03-01 MED ORDER — PANTOPRAZOLE SODIUM 40 MG PO TBEC
40.0000 mg | DELAYED_RELEASE_TABLET | Freq: Every day | ORAL | Status: DC
  Administered 2013-03-01 – 2013-03-02 (×2): 40 mg via ORAL
  Filled 2013-03-01 (×2): qty 1

## 2013-03-01 MED ORDER — INSULIN GLARGINE 100 UNIT/ML ~~LOC~~ SOLN
20.0000 [IU] | Freq: Every day | SUBCUTANEOUS | Status: DC
  Administered 2013-03-01 – 2013-03-02 (×2): 20 [IU] via SUBCUTANEOUS
  Filled 2013-03-01 (×2): qty 0.2

## 2013-03-01 MED ORDER — VENLAFAXINE HCL ER 37.5 MG PO CP24
37.5000 mg | ORAL_CAPSULE | Freq: Every day | ORAL | Status: DC
  Administered 2013-03-01 – 2013-03-02 (×2): 37.5 mg via ORAL
  Filled 2013-03-01 (×2): qty 1

## 2013-03-01 MED ORDER — ENOXAPARIN SODIUM 40 MG/0.4ML ~~LOC~~ SOLN
40.0000 mg | Freq: Every day | SUBCUTANEOUS | Status: DC
  Administered 2013-03-01 – 2013-03-02 (×2): 40 mg via SUBCUTANEOUS
  Filled 2013-03-01 (×2): qty 0.4

## 2013-03-01 MED ORDER — VALSARTAN-HYDROCHLOROTHIAZIDE 320-12.5 MG PO TABS
1.0000 | ORAL_TABLET | Freq: Every day | ORAL | Status: DC
Start: 2013-03-01 — End: 2013-03-01

## 2013-03-01 MED ORDER — LEVOTHYROXINE SODIUM 125 MCG PO TABS
125.0000 ug | ORAL_TABLET | Freq: Every day | ORAL | Status: DC
  Administered 2013-03-01 – 2013-03-02 (×2): 125 ug via ORAL
  Filled 2013-03-01 (×3): qty 1

## 2013-03-01 MED ORDER — HYDROMORPHONE HCL PF 1 MG/ML IJ SOLN
1.0000 mg | INTRAMUSCULAR | Status: DC | PRN
  Administered 2013-03-01 (×2): 1 mg via INTRAVENOUS
  Filled 2013-03-01 (×2): qty 1

## 2013-03-01 MED ORDER — KETOROLAC TROMETHAMINE 10 MG PO TABS
10.0000 mg | ORAL_TABLET | Freq: Three times a day (TID) | ORAL | Status: DC | PRN
  Administered 2013-03-01: 10 mg via ORAL
  Filled 2013-03-01: qty 1

## 2013-03-01 MED ORDER — VITAMIN D (ERGOCALCIFEROL) 1.25 MG (50000 UNIT) PO CAPS: 50000.0000 [IU] | ORAL_CAPSULE | ORAL | Status: DC

## 2013-03-01 MED ORDER — ASPIRIN EC 81 MG PO TBEC
81.0000 mg | DELAYED_RELEASE_TABLET | Freq: Every day | ORAL | Status: DC
  Administered 2013-03-01 – 2013-03-02 (×2): 81 mg via ORAL
  Filled 2013-03-01 (×2): qty 1

## 2013-03-01 MED ORDER — GABAPENTIN 300 MG PO CAPS
900.0000 mg | ORAL_CAPSULE | Freq: Every day | ORAL | Status: DC
  Administered 2013-03-01 (×2): 900 mg via ORAL
  Filled 2013-03-01 (×3): qty 3

## 2013-03-01 MED ORDER — SODIUM CHLORIDE 0.9 % IJ SOLN
3.0000 mL | Freq: Two times a day (BID) | INTRAMUSCULAR | Status: DC
  Administered 2013-03-01 – 2013-03-02 (×3): 3 mL via INTRAVENOUS

## 2013-03-01 MED ORDER — ONDANSETRON 4 MG PO TBDP
8.0000 mg | ORAL_TABLET | ORAL | Status: DC | PRN
  Administered 2013-03-01 (×2): 8 mg via ORAL
  Filled 2013-03-01 (×4): qty 2

## 2013-03-01 MED ORDER — DEXTROSE-NACL 5-0.9 % IV SOLN
INTRAVENOUS | Status: DC
  Administered 2013-03-01: 01:00:00 via INTRAVENOUS

## 2013-03-01 MED ORDER — TEMAZEPAM 15 MG PO CAPS
30.0000 mg | ORAL_CAPSULE | Freq: Every evening | ORAL | Status: DC | PRN
  Administered 2013-03-01: 30 mg via ORAL
  Filled 2013-03-01: qty 2

## 2013-03-01 MED ORDER — DOCUSATE SODIUM 100 MG PO CAPS
100.0000 mg | ORAL_CAPSULE | Freq: Two times a day (BID) | ORAL | Status: DC
  Administered 2013-03-01 – 2013-03-02 (×3): 100 mg via ORAL
  Filled 2013-03-01 (×4): qty 1

## 2013-03-01 MED ORDER — HYDROMORPHONE HCL PF 1 MG/ML IJ SOLN: 1.0000 mg | Freq: Four times a day (QID) | INTRAMUSCULAR | Status: DC | PRN

## 2013-03-01 MED ORDER — COLESEVELAM HCL 625 MG PO TABS
1250.0000 mg | ORAL_TABLET | Freq: Two times a day (BID) | ORAL | Status: DC
  Administered 2013-03-01 – 2013-03-02 (×3): 1250 mg via ORAL
  Filled 2013-03-01 (×5): qty 2

## 2013-03-01 MED ORDER — INSULIN ASPART 100 UNIT/ML ~~LOC~~ SOLN
0.0000 [IU] | Freq: Three times a day (TID) | SUBCUTANEOUS | Status: DC
  Administered 2013-03-01: 8 [IU] via SUBCUTANEOUS
  Administered 2013-03-01: 11 [IU] via SUBCUTANEOUS
  Administered 2013-03-02: 8 [IU] via SUBCUTANEOUS

## 2013-03-01 MED ORDER — HYDROCHLOROTHIAZIDE 12.5 MG PO CAPS
12.5000 mg | ORAL_CAPSULE | Freq: Every day | ORAL | Status: DC
  Administered 2013-03-01 – 2013-03-02 (×2): 12.5 mg via ORAL
  Filled 2013-03-01 (×2): qty 1

## 2013-03-01 MED ORDER — INSULIN ASPART 100 UNIT/ML ~~LOC~~ SOLN
0.0000 [IU] | SUBCUTANEOUS | Status: DC
  Administered 2013-03-01: 5 [IU] via SUBCUTANEOUS
  Administered 2013-03-01: 3 [IU] via SUBCUTANEOUS

## 2013-03-01 NOTE — Consult Note (Signed)
Subjective:   HPI  The patient is a 62 year old female who has been admitted to the hospital with atypical chest pain. It appears that cardiac issues are being ruled out and we are asked to see her in regards to dysphagia. She describes solid food dysphagia on an intermittent basis for the past several weeks. She feels as though food will stick in the lower esophagus area temporarily but then pass. She has not experiencing heartburn, or vomiting. She had a normal EGD several years ago.  Review of Systems Chest pain. No shortness of breath  Past Medical History  Diagnosis Date  . Diabetes mellitus   . Hypertension   . Hypothyroidism   . PONV (postoperative nausea and vomiting)   . Blood transfusion   . UTI (lower urinary tract infection)   . GERD (gastroesophageal reflux disease)     occ  . Fibromyalgia   . Arthritis   . Depression    Past Surgical History  Procedure Laterality Date  . Cholecystectomy    . Appendectomy    . Orthopedic surgeries      multiple  . Cesarean section      x 2  . Knee arthroplasty  09    lft partial  . Knee arthroplasty      rt  . Shoulder open rotator cuff repair      rt and lft  . Breast surgery      breast reduction  . Dorsal compartment release  06/05/2012    Procedure: RELEASE DORSAL COMPARTMENT (DEQUERVAIN);  Surgeon: Wyn Forster., MD;  Location: [facility];  Service: Orthopedics;  Laterality: Left;  Excision of mixoid cyst also  . Carpal tunnel release  06/05/2012    Procedure: CARPAL TUNNEL RELEASE;  Surgeon: Wyn Forster., MD;  Location: [facility];  Service: Orthopedics;  Laterality: Left;  . Back surgery  ,2005    April  2013 - [facility]@ [facility]   History   Social History  . Marital Status: Married    Spouse Name: N/A    Number of Children: N/A  . Years of Education: N/A   Occupational History  . Not on file.   Social History Main Topics  . Smoking status: Never Smoker   .  Smokeless tobacco: Never Used  . Alcohol Use: No  . Drug Use: No  . Sexually Active: Yes    Birth Control/ Protection: Post-menopausal   Other Topics Concern  . Not on file   Social History Narrative  . No narrative on file   family history includes Diabetes type II in her mother and unspecified family member; Heart attack in her father; Hypertension in her mother; and Hypothyroidism in an unspecified family member. Current facility-administered medications:aspirin EC tablet 81 mg, 81 mg, Oral, Daily, [place] Siren, MD, 81 mg at 03/01/13 1039;  colesevelam [facility]) tablet 1,250 mg, 1,250 mg, Oral, BID WC, [place] Siren, MD, 1,250 mg at 03/01/13 1724;  docusate sodium (COLACE) capsule 100 mg, 100 mg, Oral, BID, [place] Siren, MD, 100 mg at 03/01/13 1041;  enoxaparin (LOVENOX) injection 40 mg, 40 mg, Subcutaneous, Daily, [place] Siren, MD, 40 mg at 03/01/13 1043 gabapentin (NEURONTIN) capsule 900 mg, 900 mg, Oral, QHS, [place] Siren, MD, 900 mg at 03/01/13 0131;  hydrochlorothiazide (MICROZIDE) capsule 12.5 mg, 12.5 mg, Oral, Daily, Kathlen Mody, MD, 12.5 mg at 03/01/13 1042;  HYDROmorphone (DILAUDID) injection 1 mg, 1 mg, Intravenous, Q6H PRN, Kathlen Mody, MD;  insulin aspart (novoLOG) injection  0-15 Units, 0-15 Units, Subcutaneous, TID WC, Kathlen Mody, MD, 8 Units at 03/01/13 1723 insulin glargine (LANTUS) injection 20 Units, 20 Units, Subcutaneous, Daily, [place] Siren, MD, 20 Units at 03/01/13 1053;  irbesartan (AVAPRO) tablet 300 mg, 300 mg, Oral, Daily, Kathlen Mody, MD, 300 mg at 03/01/13 1042;  ketorolac (TORADOL) tablet 10 mg, 10 mg, Oral, Q8H PRN, Kathlen Mody, MD;  levothyroxine (SYNTHROID, LEVOTHROID) tablet 125 mcg, 125 mcg, Oral, QAC breakfast, [place] Siren, MD, 125 mcg at 03/01/13 1610 LORazepam (ATIVAN) injection 1 mg, 1 mg, Intravenous, Q4H PRN, [place] Siren, MD;  ondansetron (ZOFRAN-ODT) disintegrating tablet 8 mg, 8 mg, Oral, Q4H PRN, [place] Siren, MD, 8 mg at 03/01/13 1535;  pantoprazole (PROTONIX) EC tablet 40 mg, 40  mg, Oral, Daily, [place] Siren, MD, 40 mg at 03/01/13 1053;  sodium chloride 0.9 % injection 3 mL, 3 mL, Intravenous, Q12H, [place] Siren, MD, 3 mL at 03/01/13 0120 temazepam (RESTORIL) capsule 30 mg, 30 mg, Oral, QHS PRN, Houston Siren, MD;  venlafaxine XR (EFFEXOR-XR) 24 hr capsule 37.5 mg, 37.5 mg, Oral, Daily, [place] Siren, MD, 37.5 mg at 03/01/13 1041;  [START ON 03/05/2013] Vitamin D (Ergocalciferol) (DRISDOL) capsule 50,000 Units, 50,000 Units, Oral, Q14 Days, [place] Siren, MD Allergies  Allergen Reactions  . Ambien (Zolpidem Tartrate) Other (See Comments)    Up sleep walking and eating  . Crestor (Rosuvastatin) Other (See Comments)    Muscle weakness, cramps and aching all over body  . Lipitor (Atorvastatin) Itching and Other (See Comments)    Muscle weakness, cramps and aches all over body  . Morphine And Related Other (See Comments)    Flushing and feeling hot  . Statins Other (See Comments)    Muscle weakness, cramps and aching all over body  . Codeine Nausea And Vomiting  . Lunesta (Eszopiclone) Other (See Comments)    Causes dizziness  . Penicillins Rash and Other (See Comments)    Caused Headaches also      Objective:     BP 173/81  Pulse 103  Temp(Src) 98.4 F (36.9 C) (Oral)  Resp 18  Ht 5\' 4"  (1.626 m)  Wt 92.9 kg (204 lb 12.9 oz)  BMI 35.14 kg/m2  SpO2 100%  She is in no distress  Heart regular rhythm no murmurs  Chest wall discomfort over the left anterior lower rib cage.  Lungs clear  Abdomen: Bowel sounds normal, soft, nontender  Laboratory No components found with this basename: d1      Assessment:     #1. Dysphagia      Plan:     This has been an intermittent problem for the past 6 weeks or so. The first test I would recommend would be to do a barium swallow with a barium tablet. If this cannot be done over the weekend and there is no other reason to keep her in the hospital it can be done as an outpatient.

## 2013-03-01 NOTE — H&P (Signed)
[facility] History and Physical  [name] ZOX:[id number] DOB: Jun 21, 1951    PCP:   [name], MD   Chief Complaint: substernal chest pain.  HPI: [name] is an 62 y.o. female with hx of moderate anxiety, DM2, HTN, hypothyroidism, GERD, fibromyalgia, presents to the ER with one day hx of substernal chest pain.   She reported no shortness of breath, nausea, vomiting or diaphoresis.  There has been no reported hx of known CAD, or exertional chest pain.  She has no fever, chills, or coughs.  She also stated that she has progressive dysphagia, with the feeling of food stuck about her mid to low esophagus.  Evaluation in the ER included a CXR which was negative, EKG showed no ischemic changes, and troponin was negative.  Hospitalist was asked to admit her for atypical chest pain.  Rewiew of Systems:  Constitutional: Negative for malaise, fever and chills. No significant weight loss or weight gain Eyes: Negative for eye pain, redness and discharge, diplopia, visual changes, or flashes of light. ENMT: Negative for ear pain, hoarseness, nasal congestion, sinus pressure and sore throat. No headaches; tinnitus, drooling, or problem swallowing. Cardiovascular: Negative for  palpitations, diaphoresis, dyspnea and peripheral edema. ; No orthopnea, PND Respiratory: Negative for cough, hemoptysis, wheezing and stridor. No pleuritic chestpain. Gastrointestinal: Negative for nausea, vomiting, diarrhea, constipation, abdominal pain, melena, blood in stool, hematemesis, jaundice and rectal bleeding.    Genitourinary: Negative for frequency, dysuria, incontinence,flank pain and hematuria; Musculoskeletal: Negative for back pain and neck pain. Negative for swelling and trauma.;  Skin: . Negative for pruritus, rash, abrasions, bruising and skin lesion.; ulcerations Neuro: Negative for headache, lightheadedness and neck stiffness. Negative for weakness, altered level of consciousness , altered  mental status, extremity weakness, burning feet, involuntary movement, seizure and syncope.  Psych: negative for depression, insomnia, tearfulness, panic attacks, hallucinations, paranoia, suicidal or homicidal ideation    Past Medical History  Diagnosis Date  . Diabetes mellitus   . Hypertension   . Hypothyroidism   . PONV (postoperative nausea and vomiting)   . Blood transfusion   . UTI (lower urinary tract infection)   . GERD (gastroesophageal reflux disease)     occ  . Fibromyalgia   . Arthritis   . Depression     Past Surgical History  Procedure Laterality Date  . Cholecystectomy    . Appendectomy    . Orthopedic surgeries      multiple  . Cesarean section      x 2  . Knee arthroplasty  09    lft partial  . Knee arthroplasty      rt  . Shoulder open rotator cuff repair      rt and lft  . Breast surgery      breast reduction  . Dorsal compartment release  06/05/2012    Procedure: RELEASE DORSAL COMPARTMENT (DEQUERVAIN);  Surgeon: Wyn Forster., MD;  Location: [facility];  Service: Orthopedics;  Laterality: Left;  Excision of mixoid cyst also  . Carpal tunnel release  06/05/2012    Procedure: CARPAL TUNNEL RELEASE;  Surgeon: Wyn Forster., MD;  Location: [facility];  Service: Orthopedics;  Laterality: Left;  . Back surgery  ,2005    April  2013 - [facility] fusion@ [facility]    Medications:  HOME MEDS: Prior to Admission medications   Medication Sig Start Date End Date Taking? Authorizing Provider  acetaminophen (TYLENOL) 500 MG tablet Take 1,000 mg by  mouth every 6 (six) hours as needed. pain   Yes Historical Provider, MD  aspirin EC 81 MG tablet Take 81 mg by mouth daily.   Yes Historical Provider, MD  Calcium Carbonate-Vitamin D (CALCIUM 600 + D PO) Take 1 tablet by mouth daily.    Yes Historical Provider, MD  colesevelam (WELCHOL) 625 MG tablet Take 1,250 mg by mouth 2 (two) times daily with a meal.    Yes Historical Provider,  MD  cyclobenzaprine (FLEXERIL) 10 MG tablet Take 10 mg by mouth 3 (three) times daily as needed. For muscle pain   Yes Historical Provider, MD  diphenhydrAMINE (BENADRYL) 25 MG tablet Take 25 mg by mouth every 6 (six) hours as needed for allergies.   Yes Historical Provider, MD  esomeprazole (NEXIUM) 20 MG capsule Take 20 mg by mouth daily as needed (ulcer / acid reflux).    Yes Historical Provider, MD  gabapentin (NEURONTIN) 300 MG capsule Take 900 mg by mouth at bedtime.    Yes Historical Provider, MD  HYDROcodone-acetaminophen (NORCO/VICODIN) 5-325 MG per tablet Take 1 tablet by mouth every 4 (four) hours as needed for pain.   Yes Historical Provider, MD  insulin glargine (LANTUS) 100 UNIT/ML injection Inject 42 Units into the skin every morning.  06/28/12  Yes Osvaldo Shipper, MD  insulin lispro (HUMALOG) 100 UNIT/ML injection Inject 10 Units into the skin 3 (three) times daily before meals. Take 10 u meal coverage plus SSI: If CBG < 70: Drink juice; CBG 70-120: 0 u; CBG 121-150: 3 u; CBG 151-200: 4 u; CBG 201-250: 7 u; CBG 251-300: 11 u;CBG 301-350: 15 u; CBG 351-400: 20 u; CBG > 400: call MD 06/25/12  Yes Hilario Quarry, MD  levothyroxine (SYNTHROID, LEVOTHROID) 125 MCG tablet Take 125 mcg by mouth daily.    Yes Historical Provider, MD  meloxicam (MOBIC) 7.5 MG tablet Take 7.5 mg by mouth 2 (two) times daily as needed for pain.    Yes Historical Provider, MD  metFORMIN (GLUCOPHAGE) 1000 MG tablet Take 1,000 mg by mouth 2 (two) times daily with a meal.   Yes Historical Provider, MD  Multiple Vitamin (MULITIVITAMIN WITH MINERALS) TABS Take 1 tablet by mouth daily.     Yes Historical Provider, MD  ondansetron (ZOFRAN-ODT) 8 MG disintegrating tablet Take 8 mg by mouth every 4 (four) hours as needed for nausea.   Yes Historical Provider, MD  promethazine (PHENERGAN) 25 MG tablet Take 25 mg by mouth every 6 (six) hours as needed. nausea   Yes Historical Provider, MD  temazepam (RESTORIL) 30 MG capsule  Take 30 mg by mouth at bedtime as needed for sleep.    Yes Historical Provider, MD  valsartan-hydrochlorothiazide (DIOVAN-HCT) 320-12.5 MG per tablet Take 1 tablet by mouth daily.   Yes Historical Provider, MD  venlafaxine (EFFEXOR-XR) 37.5 MG 24 hr capsule Take 37.5 mg by mouth daily.   Yes Historical Provider, MD  Vitamin D, Ergocalciferol, (DRISDOL) 50000 UNITS CAPS Take 50,000 Units by mouth See admin instructions. Filled on 01/30/13, takes every other week   Yes Historical Provider, MD     Allergies:  Allergies  Allergen Reactions  . Ambien (Zolpidem Tartrate) Other (See Comments)    Up sleep walking and eating  . Crestor (Rosuvastatin) Other (See Comments)    Muscle weakness, cramps and aching all over body  . Lipitor (Atorvastatin) Itching and Other (See Comments)    Muscle weakness, cramps and aches all over body  . Morphine And Related Other (See  Comments)    Flushing and feeling hot  . Statins Other (See Comments)    Muscle weakness, cramps and aching all over body  . Codeine Nausea And Vomiting  . Lunesta (Eszopiclone) Other (See Comments)    Causes dizziness  . Penicillins Rash and Other (See Comments)    Caused Headaches also     Social History:   reports that she has never smoked. She has never used smokeless tobacco. She reports that she does not drink alcohol or use illicit drugs.  Family History: Family History  Problem Relation Age of Onset  . Diabetes type II    . Hypothyroidism    . Heart attack Father     35s  . Diabetes type II Mother   . Hypertension Mother      Physical Exam: Filed Vitals:   03/01/13 0000 03/01/13 0047 03/01/13 0140 03/01/13 0410  BP: 150/61 176/93 148/72 153/87  Pulse: 74 69 82 79  Temp:  98.7 F (37.1 C)  98.9 F (37.2 C)  TempSrc:  Oral  Oral  Resp: 19 20  18   Height:  5\' 4"  (1.626 m)    Weight:  92.9 kg (204 lb 12.9 oz)    SpO2: 100% 97%  95%   Blood pressure 153/87, pulse 79, temperature 98.9 F (37.2 C),  temperature source Oral, resp. rate 18, height 5\' 4"  (1.626 m), weight 92.9 kg (204 lb 12.9 oz), SpO2 95.00%.  GEN:  Pleasant  patient lying in the stretcher in no acute distress; cooperative with exam. PSYCH:  alert and oriented x4; does not appear anxious or depressed; affect is appropriate. HEENT: Mucous membranes pink and anicteric; PERRLA; EOM intact; no cervical lymphadenopathy nor thyromegaly or carotid bruit; no JVD; There were no stridor. Neck is very supple. Breasts:: Not examined CHEST WALL: No tenderness CHEST: Normal respiration, clear to auscultation bilaterally.  HEART: Regular rate and rhythm.  There are no murmur, rub, or gallops.   BACK: No kyphosis or scoliosis; no CVA tenderness ABDOMEN: soft and non-tender; no masses, no organomegaly, normal abdominal bowel sounds; no pannus; no intertriginous candida. There is no rebound and no distention. Rectal Exam: Not done EXTREMITIES: No bone or joint deformity; age-appropriate arthropathy of the hands and knees; no edema; no ulcerations.  There is no calf tenderness. Genitalia: not examined PULSES: 2+ and symmetric SKIN: Normal hydration no rash or ulceration CNS: Cranial nerves 2-12 grossly intact no focal lateralizing neurologic deficit.  Speech is fluent; uvula elevated with phonation, facial symmetry and tongue midline. DTR are normal bilaterally, cerebella exam is intact, barbinski is negative and strengths are equaled bilaterally.  No sensory loss.   Labs on Admission:  Basic Metabolic Panel:  Recent Labs Lab 02/28/13 2117  NA 136  K 4.0  CL 101  CO2 28  GLUCOSE 254*  BUN 13  CREATININE 0.82  CALCIUM 9.2   Liver Function Tests:  Recent Labs Lab 02/28/13 2117  AST 14  ALT 15  ALKPHOS 129*  BILITOT 0.3  PROT 6.4  ALBUMIN 3.2*   No results found for this basename: LIPASE, AMYLASE,  in the last 168 hours No results found for this basename: AMMONIA,  in the last 168 hours CBC:  Recent Labs Lab  02/28/13 2117  WBC 4.3  HGB 10.4*  HCT 31.7*  MCV 73.7*  PLT 224   Cardiac Enzymes:  Recent Labs Lab 03/01/13 0100  TROPONINI <0.30    CBG:  Recent Labs Lab 03/01/13 0051  GLUCAP 233*  Radiological Exams on Admission: Dg Chest 2 View  02/28/2013   *RADIOLOGY REPORT*  Clinical Data: Chest pain  CHEST - 2 VIEW  Comparison: None.  Findings: Normal mediastinum and cardiac silhouette.  Normal pulmonary  vasculature.  No evidence of effusion, infiltrate, or pneumothorax.  No acute bony abnormality. Degenerative osteophytosis of the thoracic spine.  IMPRESSION: No acute cardiopulmonary process.   Original Report Authenticated By: Genevive Bi, M.D.    EKG: Independently reviewed. No ischemic changes.   Assessment/Plan Present on Admission:  . Atypical chest pain . Dysphagia . Type II diabetes mellitus . Hypothyroidism . Hypertension  PLAN: I will admit her for atypical chest pain r/out.  I don't think this is acute coronary syndrome.  Will obtain ECHO to evaluate any abnormal wall motion abnormality.  I am also suspicious that she has an esophageal stricture with her dysphagia.  Could be from GERD or even potential malignancy.  Please consult GI for EGD.  She will be given ASA, and her medications will be continued.  For her hypothroidism, will continue supplement and check TSH.  Her DM will be controlled with SSI.  She is stable, full code, and will be admitted to [facility] service.  Thank you for allowing me to partake in the care of your nice patient.  Other plans as per orders.  Code Status: FULL Unk Lightning, MD. [facility] Pager [phone] 7pm to 7am.  03/01/2013, 4:40 AM

## 2013-03-01 NOTE — Progress Notes (Signed)
  Echocardiogram 2D Echocardiogram has been performed.  Arvil Chaco 03/01/2013, 9:32 AM

## 2013-03-01 NOTE — Progress Notes (Signed)
[facility] PROGRESS NOTE  [name] VHQ:[id number] DOB: 1950/10/19 DOA: 02/28/2013 PCP: [name], MD  Assessment/Plan: 1. Atypical chest pain: tender to palpation. cadiac enzymes negative. ekg is pending. PAIN CONTROL with toradol. Resume aspirin. Not on  b blockers, .  2. Dysphagia; as per the patient she underwent EGD and colonoscopy by Dr Randa Evens in the past and was within normal limits. Will request consult from GI for further recommendations.  3. Diabetes Mellitus; hgba1c is pending and resume lantus and SSI.  4. HYPERTENSION: controlled.  5. Hypothyroidism: resume synthroid.  6. DVT prophylaxis.   Code Status: full code Family Communication: none atbedside Disposition Plan: pending.    Consultants:  GI   Procedures:  Echocardiogram pending.   Antibiotics: None.  HPI/Subjective: PAIN improved, but feel nauseated from dilaudid  Objective: Filed Vitals:   03/01/13 0000 03/01/13 0047 03/01/13 0140 03/01/13 0410  BP: 150/61 176/93 148/72 153/87  Pulse: 74 69 82 79  Temp:  98.7 F (37.1 C)  98.9 F (37.2 C)  TempSrc:  Oral  Oral  Resp: 19 20  18   Height:  5\' 4"  (1.626 m)    Weight:  92.9 kg (204 lb 12.9 oz)    SpO2: 100% 97%  95%    Intake/Output Summary (Last 24 hours) at 03/01/13 1037 Last data filed at 02/28/13 2300  Gross per 24 hour  Intake      0 ml  Output      1 ml  Net     -1 ml   Filed Weights   03/01/13 0047  Weight: 92.9 kg (204 lb 12.9 oz)    Exam:   General:  Alert afebrile comfortable  Cardiovascular: S1S2  Respiratory: CTAB  Abdomen: soft NT ND BS+  Musculoskeletal: no pedal edema.   Data Reviewed: Basic Metabolic Panel:  Recent Labs Lab 02/28/13 2117  NA 136  K 4.0  CL 101  CO2 28  GLUCOSE 254*  BUN 13  CREATININE 0.82  CALCIUM 9.2   Liver Function Tests:  Recent Labs Lab 02/28/13 2117  AST 14  ALT 15  ALKPHOS 129*  BILITOT 0.3  PROT 6.4  ALBUMIN 3.2*   No results found for this  basename: LIPASE, AMYLASE,  in the last 168 hours No results found for this basename: AMMONIA,  in the last 168 hours CBC:  Recent Labs Lab 02/28/13 2117  WBC 4.3  HGB 10.4*  HCT 31.7*  MCV 73.7*  PLT 224   Cardiac Enzymes:  Recent Labs Lab 03/01/13 0100 03/01/13 0735  TROPONINI <0.30 <0.30   BNP (last 3 results) No results found for this basename: PROBNP,  in the last 8760 hours CBG:  Recent Labs Lab 03/01/13 0051 03/01/13 0408  GLUCAP 233* 174*    No results found for this or any previous visit (from the past 240 hour(s)).   Studies: Dg Chest 2 View  02/28/2013   *RADIOLOGY REPORT*  Clinical Data: Chest pain  CHEST - 2 VIEW  Comparison: None.  Findings: Normal mediastinum and cardiac silhouette.  Normal pulmonary  vasculature.  No evidence of effusion, infiltrate, or pneumothorax.  No acute bony abnormality. Degenerative osteophytosis of the thoracic spine.  IMPRESSION: No acute cardiopulmonary process.   Original Report Authenticated By: Genevive Bi, M.D.    Scheduled Meds: . aspirin EC  81 mg Oral Daily  . colesevelam  1,250 mg Oral BID WC  . docusate sodium  100 mg Oral BID  . enoxaparin (LOVENOX) injection  40 mg  Subcutaneous Daily  . gabapentin  900 mg Oral QHS  . irbesartan  300 mg Oral Daily   And  . hydrochlorothiazide  12.5 mg Oral Daily  . insulin aspart  0-15 Units Subcutaneous TID WC  . insulin glargine  20 Units Subcutaneous Daily  . levothyroxine  125 mcg Oral QAC breakfast  . pantoprazole  40 mg Oral Daily  . sodium chloride  3 mL Intravenous Q12H  . venlafaxine XR  37.5 mg Oral Daily  . [START ON 03/05/2013] Vitamin D (Ergocalciferol)  50,000 Units Oral Q14 Days   Continuous Infusions: . dextrose 5 % and 0.9% NaCl 75 mL/hr at 03/01/13 0123    Principal Problem:   Atypical chest pain Active Problems:   Type II diabetes mellitus   Hypothyroidism   Hypertension   Dysphagia    Time spent: 5    [facility]  [facility] Pager [phone] If 7PM-7AM, please contact night-coverage at [email or web address], password [facility] 03/01/2013, 10:37 AM  LOS: 1 day

## 2013-03-01 NOTE — Progress Notes (Signed)
SLP Cancellation Note  Patient Details Name: [name] MRN: [id number] DOB: August 17, 1950   Cancelled evaluation:    Order received for clinical swallow eval.  Will defer assessment until after GI sees for EGD, then determine if our services are warranted.  D/W Dr. Blake Divine.  Kayti Poss L. Samson Frederic, [place] CCC/SLP Pager [phone]       [name] 03/01/2013, 10:12 AM

## 2013-03-01 NOTE — Care Management Note (Unsigned)
    Page 1 of 1   03/01/2013     3:46:46 PM   CARE MANAGEMENT NOTE 03/01/2013  Patient:  [name], [name]   Account Number:  [id number]  Date Initiated:  03/01/2013  Documentation initiated by:  Gaby Harney  Subjective/Objective Assessment:   PT ADM WITH ATYPICAL CHEST PAIN, DYSPHAGIA.  PTA, PT INDEPENDENT, LIVES WITH SPOUSE.     Action/Plan:   WILL FOLLOW FOR HOME NEEDS AS PT PROGRESSES.   Anticipated DC Date:  03/02/2013   Anticipated DC Plan:  HOME/SELF CARE      DC Planning Services  CM consult      Choice offered to / List presented to:             Status of service:  In process, will continue to follow Medicare Important Message given?   (If response is "NO", the following Medicare IM given date fields will be blank) Date Medicare IM given:   Date Additional Medicare IM given:    Discharge Disposition:    Per UR Regulation:  Reviewed for med. necessity/level of care/duration of stay  If discussed at Long Length of Stay Meetings, dates discussed:    Comments:

## 2013-03-02 LAB — GLUCOSE, CAPILLARY: Glucose-Capillary: 473 mg/dL — ABNORMAL HIGH (ref 70–99)

## 2013-03-02 MED ORDER — INSULIN ASPART 100 UNIT/ML ~~LOC~~ SOLN
20.0000 [IU] | Freq: Once | SUBCUTANEOUS | Status: AC
  Administered 2013-03-02: 20 [IU] via SUBCUTANEOUS

## 2013-03-02 MED ORDER — KETOROLAC TROMETHAMINE 10 MG PO TABS: 10.0000 mg | ORAL_TABLET | Freq: Three times a day (TID) | ORAL | Status: DC | PRN

## 2013-03-02 MED ORDER — INSULIN ASPART 100 UNIT/ML ~~LOC~~ SOLN: 0.0000 [IU] | Freq: Three times a day (TID) | SUBCUTANEOUS | Status: DC

## 2013-03-02 NOTE — Progress Notes (Signed)
Discharge instructions along with med list and script provided. IV d/c'd with catheter intact. Patient transported via wheelchair to lobby for d/c home.[name]

## 2013-03-02 NOTE — Progress Notes (Signed)
Spoke with Endoscopy this am. Unable to do barium swallow this am d/t multiple procedures today. Called to evaluate urgency; Dr. Blake Divine notified. Will reassess. [name]

## 2013-03-02 NOTE — Progress Notes (Signed)
GASTROENTEROLOGY PROGRESS NOTE  Problem:   Chest pain. Intermittent dysphagia.  Subjective: The patient indicates that she is swallowing fine here in the hospital, and in fact, is eating lunch when I came in the room.   She does report, however, episodes of intermittent dysphagia prior to admission, where food would "stick" and prevent further food consumption on an occasional intermittent basis. This symptom with last a while and prevent her from further eating at that time.   Objective: The barium swallow ordered for this morning could not be accomplished, apparently 2 to scheduling problems.  Assessment: Intermittent dysphagia. This sounds somewhat like a Schatzki's ring.  Plan: The patient's cardiac evaluation is negative, and I have spoken with Dr. Blake Divine who feels that it is okay for the patient to go home today. I feel that is fine from the GI standpoint.   I have recommended to the patient that she contact her primary gastroenterologist, Dr. Carman Ching, to arrange outpatient followup which might include a barium swallow followed by an office visit.  I will plan to sign off at this time. Please feel free to call me if I can be of further assistance in the care of this patient.  [name], M.D. 03/02/2013 12:05 PM

## 2013-03-02 NOTE — Discharge Summary (Signed)
Physician Discharge Summary  [name] UJW:[id number] DOB: 13-Jun-1951 DOA: 02/28/2013  PCP: [name], MD  Admit date: 02/28/2013 Discharge date: 03/02/2013  Time spent: 30 minutes  Recommendations for Outpatient Follow-up:  Follow up with Dr Randa Evens in one week Follow up with Dr Mayford Knife in one week for possible stress test. Discharge Diagnoses:  Principal Problem:   Atypical chest pain Active Problems:   Type II diabetes mellitus   Hypothyroidism   Hypertension   Dysphagia   Discharge Condition: improved.  Diet recommendation: CARB MODIFED DIET  Filed Weights   03/01/13 0047  Weight: 92.9 kg (204 lb 12.9 oz)    History of present illness:  [name] is an 62 y.o. female with hx of moderate anxiety, DM2, HTN, hypothyroidism, GERD, fibromyalgia, presents to the ER with one day hx of substernal chest pain. She reported no shortness of breath, nausea, vomiting or diaphoresis. There has been no reported hx of known CAD, or exertional chest pain. She has no fever, chills, or coughs. She also stated that she has progressive dysphagia, with the feeling of food stuck about her mid to low esophagus. Evaluation in the ER included a CXR which was negative, EKG showed no ischemic changes, and troponin was negative. Hospitalist was asked to admit her for atypical chest pain.   Hospital Course:  Atypical chest pain: tender to palpation. cadiac enzymes negative. ekg is NSR.  PAIN CONTROL with toradol. Resume aspirin. Not on b blockers, .  She was recommended to follow up with Dr Mayford Knife.   Dysphagia; as per the patient she underwent EGD and colonoscopy by Dr Randa Evens in the past and was within normal limits. requested consult from GI for further recommendations. Out patient follow upw ith GI.   Diabetes Mellitus: resume lantus and SSI.  HYPERTENSION: controlled.  Hypothyroidism: resume synthroid.    Consultations:  gi  Discharge Exam: Filed Vitals:   03/01/13 1400  03/01/13 2209 03/02/13 0641 03/02/13 0920  BP: 173/81 132/55 144/61 138/59  Pulse: 103 101 95 86  Temp: 98.4 F (36.9 C) 98.4 F (36.9 C) 98 F (36.7 C)   TempSrc: Oral Oral Oral   Resp: 18 18 18 16   Height:      Weight:      SpO2: 100% 94% 92% 95%    General: Alert afebrile comfortable  Cardiovascular: S1S2  Respiratory: CTAB  Abdomen: soft NT ND BS+  Musculoskeletal: no pedal edema.    Discharge Instructions  Discharge Orders   Future Orders Complete By Expires     Diet Carb Modified  As directed     Discharge instructions  As directed     Comments:      Follow up with Dr Mayford Knife in one week, for possible stress test today.  Follow upwith Dr Sharl Ma in one week.        Medication List    STOP taking these medications       cyclobenzaprine 10 MG tablet  Commonly known as:  FLEXERIL      TAKE these medications       acetaminophen 500 MG tablet  Commonly known as:  TYLENOL  Take 1,000 mg by mouth every 6 (six) hours as needed. pain     aspirin EC 81 MG tablet  Take 81 mg by mouth daily.     CALCIUM 600 + D PO  Take 1 tablet by mouth daily.     colesevelam 625 MG tablet  Commonly known as:  WELCHOL  Take  1,250 mg by mouth 2 (two) times daily with a meal.     diphenhydrAMINE 25 MG tablet  Commonly known as:  BENADRYL  Take 25 mg by mouth every 6 (six) hours as needed for allergies.     esomeprazole 20 MG capsule  Commonly known as:  NEXIUM  Take 20 mg by mouth daily as needed (ulcer / acid reflux).     gabapentin 300 MG capsule  Commonly known as:  NEURONTIN  Take 900 mg by mouth at bedtime.     HYDROcodone-acetaminophen 5-325 MG per tablet  Commonly known as:  NORCO/VICODIN  Take 1 tablet by mouth every 4 (four) hours as needed for pain.     insulin aspart 100 UNIT/ML injection  Commonly known as:  novoLOG  Inject 0-15 Units into the skin 3 (three) times daily with meals.     insulin glargine 100 UNIT/ML injection  Commonly known as:  LANTUS   Inject 42 Units into the skin every morning.     insulin lispro 100 UNIT/ML injection  Commonly known as:  HUMALOG  Inject 10 Units into the skin 3 (three) times daily before meals. Take 10 u meal coverage plus SSI: If CBG < 70: Drink juice; CBG 70-120: 0 u; CBG 121-150: 3 u; CBG 151-200: 4 u; CBG 201-250: 7 u; CBG 251-300: 11 u;CBG 301-350: 15 u; CBG 351-400: 20 u; CBG > 400: call MD     levothyroxine 125 MCG tablet  Commonly known as:  SYNTHROID, LEVOTHROID  Take 125 mcg by mouth daily.     meloxicam 7.5 MG tablet  Commonly known as:  MOBIC  Take 7.5 mg by mouth 2 (two) times daily as needed for pain.     metFORMIN 1000 MG tablet  Commonly known as:  GLUCOPHAGE  Take 1,000 mg by mouth 2 (two) times daily with a meal.     multivitamin with minerals Tabs  Take 1 tablet by mouth daily.     ondansetron 8 MG disintegrating tablet  Commonly known as:  ZOFRAN-ODT  Take 8 mg by mouth every 4 (four) hours as needed for nausea.     promethazine 25 MG tablet  Commonly known as:  PHENERGAN  Take 25 mg by mouth every 6 (six) hours as needed. nausea     temazepam 30 MG capsule  Commonly known as:  RESTORIL  Take 30 mg by mouth at bedtime as needed for sleep.     valsartan-hydrochlorothiazide 320-12.5 MG per tablet  Commonly known as:  DIOVAN-HCT  Take 1 tablet by mouth daily.     venlafaxine XR 37.5 MG 24 hr capsule  Commonly known as:  EFFEXOR-XR  Take 37.5 mg by mouth daily.     Vitamin D (Ergocalciferol) 50000 UNITS Caps  Commonly known as:  DRISDOL  Take 50,000 Units by mouth See admin instructions. Filled on 01/30/13, takes every other week       Allergies  Allergen Reactions  . Ambien (Zolpidem Tartrate) Other (See Comments)    Up sleep walking and eating  . Crestor (Rosuvastatin) Other (See Comments)    Muscle weakness, cramps and aching all over body  . Lipitor (Atorvastatin) Itching and Other (See Comments)    Muscle weakness, cramps and aches all over body  .  Morphine And Related Other (See Comments)    Flushing and feeling hot  . Statins Other (See Comments)    Muscle weakness, cramps and aching all over body  . Codeine Nausea And Vomiting  .  Lunesta (Eszopiclone) Other (See Comments)    Causes dizziness  . Penicillins Rash and Other (See Comments)    Caused Headaches also       The results of significant diagnostics from this hospitalization (including imaging, microbiology, ancillary and laboratory) are listed below for reference.    Significant Diagnostic Studies: Dg Chest 2 View  02/28/2013   *RADIOLOGY REPORT*  Clinical Data: Chest pain  CHEST - 2 VIEW  Comparison: None.  Findings: Normal mediastinum and cardiac silhouette.  Normal pulmonary  vasculature.  No evidence of effusion, infiltrate, or pneumothorax.  No acute bony abnormality. Degenerative osteophytosis of the thoracic spine.  IMPRESSION: No acute cardiopulmonary process.   Original Report Authenticated By: Genevive Bi, M.D.    Microbiology: No results found for this or any previous visit (from the past 240 hour(s)).   Labs: Basic Metabolic Panel:  Recent Labs Lab 02/28/13 2117  NA 136  K 4.0  CL 101  CO2 28  GLUCOSE 254*  BUN 13  CREATININE 0.82  CALCIUM 9.2   Liver Function Tests:  Recent Labs Lab 02/28/13 2117  AST 14  ALT 15  ALKPHOS 129*  BILITOT 0.3  PROT 6.4  ALBUMIN 3.2*   No results found for this basename: LIPASE, AMYLASE,  in the last 168 hours No results found for this basename: AMMONIA,  in the last 168 hours CBC:  Recent Labs Lab 02/28/13 2117  WBC 4.3  HGB 10.4*  HCT 31.7*  MCV 73.7*  PLT 224   Cardiac Enzymes:  Recent Labs Lab 03/01/13 0100 03/01/13 0735 03/01/13 1300  TROPONINI <0.30 <0.30 <0.30   BNP: BNP (last 3 results) No results found for this basename: PROBNP,  in the last 8760 hours CBG:  Recent Labs Lab 03/01/13 1629 03/01/13 2132 03/02/13 0638 03/02/13 1034 03/02/13 1115  GLUCAP 285* 389*  473* 339* 281*       Signed:  Zakary Kimura  [facility] 03/02/2013, 12:26 PM

## 2013-04-08 HISTORY — PX: CARDIAC CATHETERIZATION: SHX172

## 2013-04-22 ENCOUNTER — Other Ambulatory Visit: Payer: Self-pay | Admitting: Certified Nurse Midwife

## 2013-05-01 ENCOUNTER — Observation Stay (HOSPITAL_COMMUNITY): Payer: 59

## 2013-05-01 ENCOUNTER — Encounter (HOSPITAL_COMMUNITY): Payer: Self-pay | Admitting: *Deleted

## 2013-05-01 ENCOUNTER — Other Ambulatory Visit: Payer: Self-pay | Admitting: Cardiology

## 2013-05-01 ENCOUNTER — Observation Stay (HOSPITAL_COMMUNITY)
Admission: AD | Admit: 2013-05-01 | Discharge: 2013-05-03 | Disposition: A | Discharge status: Home / Self Care | Payer: 59 | Source: Ambulatory Visit | Attending: Cardiology | Admitting: Cardiology

## 2013-05-01 DIAGNOSIS — I10 Essential (primary) hypertension: Secondary | ICD-10-CM | POA: Insufficient documentation

## 2013-05-01 DIAGNOSIS — N390 Urinary tract infection, site not specified: Secondary | ICD-10-CM

## 2013-05-01 DIAGNOSIS — E039 Hypothyroidism, unspecified: Secondary | ICD-10-CM

## 2013-05-01 DIAGNOSIS — K760 Fatty (change of) liver, not elsewhere classified: Secondary | ICD-10-CM

## 2013-05-01 DIAGNOSIS — D500 Iron deficiency anemia secondary to blood loss (chronic): Secondary | ICD-10-CM

## 2013-05-01 DIAGNOSIS — R9439 Abnormal result of other cardiovascular function study: Secondary | ICD-10-CM | POA: Insufficient documentation

## 2013-05-01 DIAGNOSIS — R0602 Shortness of breath: Secondary | ICD-10-CM | POA: Insufficient documentation

## 2013-05-01 DIAGNOSIS — N179 Acute kidney failure, unspecified: Secondary | ICD-10-CM

## 2013-05-01 DIAGNOSIS — R0789 Other chest pain: Principal | ICD-10-CM | POA: Insufficient documentation

## 2013-05-01 DIAGNOSIS — E119 Type 2 diabetes mellitus without complications: Secondary | ICD-10-CM | POA: Insufficient documentation

## 2013-05-01 DIAGNOSIS — R1310 Dysphagia, unspecified: Secondary | ICD-10-CM

## 2013-05-01 DIAGNOSIS — R1013 Epigastric pain: Secondary | ICD-10-CM

## 2013-05-01 LAB — COMPREHENSIVE METABOLIC PANEL
ALT: 13 U/L (ref 0–35)
AST: 12 U/L (ref 0–37)
Albumin: 3.6 g/dL (ref 3.5–5.2)
Alkaline Phosphatase: 128 U/L — ABNORMAL HIGH (ref 39–117)
BUN: 20 mg/dL (ref 6–23)
Calcium: 9.6 mg/dL (ref 8.4–10.5)
Chloride: 99 mEq/L (ref 96–112)
Potassium: 3.6 mEq/L (ref 3.5–5.1)
Sodium: 134 mEq/L — ABNORMAL LOW (ref 135–145)
Total Protein: 7 g/dL (ref 6.0–8.3)

## 2013-05-01 LAB — T4, FREE: Free T4: 1.18 ng/dL (ref 0.80–1.80)

## 2013-05-01 LAB — TROPONIN I
Troponin I: <0.3 ng/mL (ref <0.30)
Troponin I: <0.3 ng/mL (ref <0.30)
Troponin I: <0.3 ng/mL (ref <0.30)

## 2013-05-01 LAB — MAGNESIUM: Magnesium: 1.7 mg/dL (ref 1.5–2.5)

## 2013-05-01 LAB — CBC WITH DIFFERENTIAL/PLATELET
Basophils Absolute: 0 10*3/uL (ref 0.0–0.1)
Eosinophils Absolute: 0.2 10*3/uL (ref 0.0–0.7)
Lymphocytes Relative: 31 % (ref 12–46)
MCHC: 33.5 g/dL (ref 30.0–36.0)
MCV: 72.2 fL — ABNORMAL LOW (ref 78.0–100.0)
Monocytes Relative: 5 % (ref 3–12)
Neutrophils Relative %: 61 % (ref 43–77)
Platelets: 300 10*3/uL (ref 150–400)
RDW: 14.1 % (ref 11.5–15.5)
WBC: 6.6 10*3/uL (ref 4.0–10.5)

## 2013-05-01 LAB — PROTIME-INR: INR: 1 (ref 0.00–1.49)

## 2013-05-01 LAB — GLUCOSE, CAPILLARY
Glucose-Capillary: 216 mg/dL — ABNORMAL HIGH (ref 70–99)
Glucose-Capillary: 218 mg/dL — ABNORMAL HIGH (ref 70–99)

## 2013-05-01 LAB — APTT: aPTT: 27 seconds (ref 24–37)

## 2013-05-01 LAB — D-DIMER, QUANTITATIVE: D-Dimer, Quant: 0.47 ug/mL-FEU (ref 0.00–0.48)

## 2013-05-01 MED ORDER — SODIUM CHLORIDE 0.45 % IV SOLN
INTRAVENOUS | Status: DC
  Administered 2013-05-01: 11:00:00 via INTRAVENOUS

## 2013-05-01 MED ORDER — INSULIN ASPART 100 UNIT/ML ~~LOC~~ SOLN
0.0000 [IU] | Freq: Three times a day (TID) | SUBCUTANEOUS | Status: DC
  Administered 2013-05-01: 5 [IU] via SUBCUTANEOUS
  Administered 2013-05-02: 3 [IU] via SUBCUTANEOUS
  Administered 2013-05-03: 2 [IU] via SUBCUTANEOUS
  Administered 2013-05-03 (×2): 5 [IU] via SUBCUTANEOUS

## 2013-05-01 MED ORDER — HEPARIN SODIUM (PORCINE) 5000 UNIT/ML IJ SOLN
5000.0000 [IU] | Freq: Three times a day (TID) | INTRAMUSCULAR | Status: DC
  Administered 2013-05-01 – 2013-05-03 (×6): 5000 [IU] via SUBCUTANEOUS
  Filled 2013-05-01 (×9): qty 1

## 2013-05-01 MED ORDER — INSULIN LISPRO 100 UNIT/ML ~~LOC~~ SOLN: 10.0000 [IU] | Freq: Three times a day (TID) | SUBCUTANEOUS | Status: DC

## 2013-05-01 MED ORDER — IRBESARTAN 300 MG PO TABS
300.0000 mg | ORAL_TABLET | Freq: Every day | ORAL | Status: DC
  Administered 2013-05-02 – 2013-05-03 (×2): 300 mg via ORAL
  Filled 2013-05-01 (×3): qty 1

## 2013-05-01 MED ORDER — VALSARTAN-HYDROCHLOROTHIAZIDE 320-12.5 MG PO TABS: 1.0000 | ORAL_TABLET | Freq: Every day | ORAL | Status: DC

## 2013-05-01 MED ORDER — ASPIRIN EC 81 MG PO TBEC: 81.0000 mg | DELAYED_RELEASE_TABLET | Freq: Every day | ORAL | Status: DC

## 2013-05-01 MED ORDER — INSULIN GLARGINE 100 UNIT/ML ~~LOC~~ SOLN
42.0000 [IU] | Freq: Every day | SUBCUTANEOUS | Status: DC
  Administered 2013-05-02 – 2013-05-03 (×2): 42 [IU] via SUBCUTANEOUS
  Filled 2013-05-01 (×2): qty 0.42

## 2013-05-01 MED ORDER — ACETAMINOPHEN 325 MG PO TABS
650.0000 mg | ORAL_TABLET | ORAL | Status: DC | PRN
  Administered 2013-05-01 – 2013-05-02 (×2): 650 mg via ORAL
  Filled 2013-05-01: qty 2

## 2013-05-01 MED ORDER — NITROGLYCERIN 0.4 MG SL SUBL
0.4000 mg | SUBLINGUAL_TABLET | SUBLINGUAL | Status: DC | PRN
  Administered 2013-05-01 (×2): 0.4 mg via SUBLINGUAL
  Filled 2013-05-01: qty 25

## 2013-05-01 MED ORDER — INFLUENZA VAC SPLIT QUAD 0.5 ML IM SUSP
0.5000 mL | INTRAMUSCULAR | Status: AC
  Administered 2013-05-02: 0.5 mL via INTRAMUSCULAR
  Filled 2013-05-01: qty 0.5

## 2013-05-01 MED ORDER — INSULIN GLARGINE 100 UNIT/ML ~~LOC~~ SOLN: 42.0000 [IU] | Freq: Every morning | SUBCUTANEOUS | Status: DC

## 2013-05-01 MED ORDER — KETOROLAC TROMETHAMINE 10 MG PO TABS
10.0000 mg | ORAL_TABLET | Freq: Three times a day (TID) | ORAL | Status: DC | PRN
  Filled 2013-05-01: qty 1

## 2013-05-01 MED ORDER — HYDROCHLOROTHIAZIDE 12.5 MG PO CAPS
12.5000 mg | ORAL_CAPSULE | Freq: Every day | ORAL | Status: DC
  Administered 2013-05-02 – 2013-05-03 (×2): 12.5 mg via ORAL
  Filled 2013-05-01 (×3): qty 1

## 2013-05-01 MED ORDER — ACETAMINOPHEN 500 MG PO TABS: 1000.0000 mg | ORAL_TABLET | Freq: Four times a day (QID) | ORAL | Status: DC | PRN

## 2013-05-01 MED ORDER — VENLAFAXINE HCL ER 37.5 MG PO CP24
37.5000 mg | ORAL_CAPSULE | Freq: Every day | ORAL | Status: DC
  Administered 2013-05-01 – 2013-05-03 (×3): 37.5 mg via ORAL
  Filled 2013-05-01 (×4): qty 1

## 2013-05-01 MED ORDER — METFORMIN HCL 500 MG PO TABS: 1000.0000 mg | ORAL_TABLET | Freq: Two times a day (BID) | ORAL | Status: DC

## 2013-05-01 MED ORDER — HYDROCODONE-ACETAMINOPHEN 5-325 MG PO TABS
1.0000 | ORAL_TABLET | ORAL | Status: DC | PRN
  Administered 2013-05-01: 1 via ORAL
  Filled 2013-05-01: qty 1

## 2013-05-01 MED ORDER — METOPROLOL SUCCINATE ER 100 MG PO TB24
100.0000 mg | ORAL_TABLET | Freq: Every day | ORAL | Status: DC
  Administered 2013-05-01 – 2013-05-02 (×2): 100 mg via ORAL
  Filled 2013-05-01 (×5): qty 1

## 2013-05-01 MED ORDER — LEVOTHYROXINE SODIUM 125 MCG PO TABS
125.0000 ug | ORAL_TABLET | Freq: Every day | ORAL | Status: DC
  Administered 2013-05-02 – 2013-05-03 (×2): 125 ug via ORAL
  Filled 2013-05-01 (×4): qty 1

## 2013-05-01 MED ORDER — GABAPENTIN 300 MG PO CAPS
900.0000 mg | ORAL_CAPSULE | Freq: Every day | ORAL | Status: DC
  Filled 2013-05-01: qty 3

## 2013-05-01 MED ORDER — ASPIRIN EC 81 MG PO TBEC
81.0000 mg | DELAYED_RELEASE_TABLET | Freq: Every day | ORAL | Status: DC
  Administered 2013-05-02: 81 mg via ORAL
  Filled 2013-05-01: qty 1

## 2013-05-01 MED ORDER — METFORMIN HCL 500 MG PO TABS
1000.0000 mg | ORAL_TABLET | Freq: Two times a day (BID) | ORAL | Status: DC
Start: 2013-05-01 — End: 2013-05-02
  Administered 2013-05-01 – 2013-05-02 (×3): 1000 mg via ORAL
  Filled 2013-05-01 (×4): qty 2

## 2013-05-01 MED ORDER — ONDANSETRON HCL 4 MG/2ML IJ SOLN
4.0000 mg | Freq: Four times a day (QID) | INTRAMUSCULAR | Status: DC | PRN
  Administered 2013-05-02 – 2013-05-03 (×2): 4 mg via INTRAVENOUS
  Filled 2013-05-01: qty 2

## 2013-05-01 MED ORDER — TEMAZEPAM 15 MG PO CAPS
30.0000 mg | ORAL_CAPSULE | Freq: Every evening | ORAL | Status: DC | PRN
  Administered 2013-05-01 – 2013-05-02 (×2): 30 mg via ORAL
  Filled 2013-05-01 (×4): qty 2

## 2013-05-01 MED ORDER — INSULIN ASPART 100 UNIT/ML ~~LOC~~ SOLN: 10.0000 [IU] | Freq: Three times a day (TID) | SUBCUTANEOUS | Status: DC

## 2013-05-01 MED ORDER — COLESEVELAM HCL 625 MG PO TABS
1250.0000 mg | ORAL_TABLET | Freq: Two times a day (BID) | ORAL | Status: DC
  Administered 2013-05-02 (×2): 1250 mg via ORAL
  Filled 2013-05-01 (×4): qty 2

## 2013-05-01 NOTE — H&P (Signed)
Office Visit     Patient: [name], [name] Account Number: [id number] Provider: Druanne Bosques, MD  DOB: 03/18/1951 Age: 62 Y Sex: Female Date: 05/01/2013  Phone: [phone]   Address: [address], [place], [place]  Pcp: CYNTHIA WHITE          1. PER DR CYNTHIA WHITE FOLLOW UP.        HPI:  General:  The patient presents today for followup of her HTN and SVT. She has not been feeling well for a few days. She has been having dizzy spells along with chills and then diaphoresis. She denies any fever or cough. She is having episodes of SOB that occur at any time. This started this am. She says that her chest feels tight. She says that her chest started feeling tight on Monday. She denies any flu like symptoms. She has been having severe nausea as well with her episodes of dizziness..        ROS:  See HPI, A twelve system review was perfomed at today's visit. For pertinent positives and negatives see HPI.       Medical History: Fibromyalgia, Hypertension, type 2 diabetes mellitus--Dr. Kerr, Endometriosis, Anemia, iron deficiency, Osteoarthritis, back, knee, Insomnia, Hypercholesterolemia, Hypothyroidism--Dr. Kerr, rotator cuff tear--Dr Lucy, Restless leg syndrome, History of fibroids, Kidney stone, vitamin D deficiency, T12 compression fx 11/13, Phlebitis, Hiatal hernia, Dyspepsia, Abdominal pain, Reflux, Ovarian cysts, Fibroids, Arthritis, Degenerative joint disease, Angina, Dysphagia, Depression.        Social History:  General:  Tobacco use cigarettes: Never smoked.  no Smoking.  no Tobacco Exposure.  no Alcohol.  Caffeine: yes, 1 serving per day.  no Recreational drug use.  Exercise: yes, walks, 3 times weekly.  Occupation: retired from [employer], substitute teacher part-time, retired.  Marital Status: married.  Children: 2.        Medications: Taking Metoprolol Succinate 100 MG Tablet Extended Release 24 Hour TAKE 1 TABLET BY MOUTH EVERY DAY , Taking Venlafaxine  HCl 37.5 MG Tablet 1 tablet daily, Taking Multivitamins Tablet as directed daily, Taking Calcium 600 + D 600-200 MG-UNIT Tablet 2 tablets Once a day, Taking Aspirin 81 MG Tablet Dispersible as directed daily, Taking OneTouch Ultra Test . Strip TEST BLOOD SUGAR 3 TIMES A DAY , Taking Triamcinolone Acetonide 0.1 % Cream 1 application sparingly to affected area Twice a day, Taking BD Insulin Syr Ultrafine II 31G X 5/16 Miscellaneous USE AS DIRECTED 4 TIMES A DAY , Taking Lantus 100 UNIT/ML Solution 42 units Once a day each morning, Taking Humalog 100 UNIT/ML Solution 10 units at meals plus 1 unit for every 30 mg/dl above 100 mg/dl Three times a day, Taking Fluticasone Propionate 50 MCG/ACT Suspension USE 2 SPRAYS IN EACH NOSTRIL ONCE DAILY FOR 7 DAYS , Notes: PRN, Taking Levothyroxine Sodium 125 MCG Tablet TAKE 1 TABLET EVERY MORNING ON EMPTY STOMACH , Taking Temazepam 30 MG Capsule 1 capsule at bedtime- as needed, Taking Valsartan-Hydrochlorothiazide 320-12.5 MG Tablet TAKE 1 TABLET BY MOUTH EVERY DAY , Taking Welchol 625 MG Tablet TAKE 2 TABLETS BY MOUTH TWICE A DAY , Taking Hydrocodone-Acetaminophen 5-325 MG Tablet 1 tablet as needed every 6 hrs, Taking Promethazine HCl 25mg tablet 1 tablet every 6-8 hours as needed, Taking Nexium 20 MG Packet 1 packet mixed in 15 ml of water as needed, Taking Cyclobenzaprine HCl 10mg tablet 1 tablet as needed, Taking Meclizine HCl 12.5 MG Tablet 2 tablets q 8 hrs, prn dizzines, Not-Taking/PRN Gabapentin 300 MG Capsule 3 capsules at   bedtime, Notes: On Hold, Medication List reviewed and reconciled with the patient       Allergies: ambien: eating at night, Codeine Sulfate, Penicillin V Potassium: rash and headache: Allergy, Lipitor: myalgias in legs: Side Effects, Lyrica: nauseated, Lunesta: bad taste, Amitriptyline HCl: loopy, Lovaza: stomach pain, Morphine Sulfate.           Vitals: Wt 197.5, Wt change -4.9 lb, Ht 63, BMI 34.98, Pulse sitting 102, BP sitting 119/81, O BP  Lying 126/87, O pulse lying 93, O BP sitting 118/82, O pulse sitting 96, O BP standing 105/83, O pulse standing 102.       Examination:  Cardiology, General:  GENERAL APPEARANCE: pleasant, NAD.  HEENT: unremarkable.  CAROTID UPSTROKE: normal, no bruit.  JVD: flat.  HEART SOUNDS: regular, normal S1, S2, no S3 or S4.  MURMUR: absent.  LUNGS: no rales or wheezes.  ABDOMEN: soft, non tender, positive bowel sounds, no masses felt.  EXTREMITIES: no leg edema.  PERIPHERAL PULSES: 2 plus bilateral.            Assessment:  1. Paroxysmal supraventricular tachycardia - 427.0  2. Benign hypertension - 401.1  3. SOB (shortness of breath) - 786.05  4. Dizziness - 780.4        1. Chest Pain Imaging: EKG NSR     Corson,Danielle 05/01/2013 09:26:13 AM > Bricen Victory M 05/01/2013 09:33:26 AM >   Her EKG is nonischemic.  Her chest pain is somewhat atypical but she was in the hospital in July with atypical CP as well.  I will admit her to rule out MI and if enzymes normal plan nuclear stress test in am.  Check d-dimer and get 2D echo to rule out pericardial effusion  2.  SOB Her symptoms are vague and episodic.  No history of LE edema or recent travel.  She carreis a diagnosis of moderate anxiety disorder and is very anxious in the office almost to the point of hyperventilating.  Need to rule out PE, pericardial effusion and coronary ischemia.  3.  Diaphoresis She does not appear to be sweating at all but says that she is very hot and is fanning herself in the office.  4.  Dizziness with mild orthostasis on exam. I cannot tell form her symptoms whether she is having vertigo or orthostatic dizziness.  She describes nausea with it.  I will give her some IVF hydration since she is mildly orthostatic.  5.  History of SVT  6.  HTN    Procedure Codes: 93000 EKG I AND R         Provider: Loretta Doutt, MD  Patient: Makki, Emmamarie [name] DOB: 06/30/1951 Date: 05/01/2013       

## 2013-05-01 NOTE — Progress Notes (Signed)
Utilization review completed.  

## 2013-05-02 ENCOUNTER — Observation Stay (HOSPITAL_COMMUNITY): Payer: 59

## 2013-05-02 LAB — CBC
HCT: 33.4 % — ABNORMAL LOW (ref 36.0–46.0)
Hemoglobin: 11.1 g/dL — ABNORMAL LOW (ref 12.0–15.0)
RBC: 4.57 MIL/uL (ref 3.87–5.11)
RDW: 14.2 % (ref 11.5–15.5)

## 2013-05-02 LAB — BASIC METABOLIC PANEL
BUN: 18 mg/dL (ref 6–23)
CO2: 25 mEq/L (ref 19–32)
Calcium: 9.2 mg/dL (ref 8.4–10.5)
Chloride: 99 mEq/L (ref 96–112)
Creatinine, Ser: 0.85 mg/dL (ref 0.50–1.10)
Creatinine, Ser: 0.83 mg/dL (ref 0.50–1.10)
GFR calc non Af Amer: 72 mL/min — ABNORMAL LOW (ref >90)
Glucose, Bld: 271 mg/dL — ABNORMAL HIGH (ref 70–99)
Glucose, Bld: 205 mg/dL — ABNORMAL HIGH (ref 70–99)
Potassium: 4.2 mEq/L (ref 3.5–5.1)
Sodium: 136 mEq/L (ref 135–145)

## 2013-05-02 LAB — LIPID PANEL
Cholesterol: 195 mg/dL (ref 0–200)
HDL: 35 mg/dL — ABNORMAL LOW (ref >39)
LDL Cholesterol: 108 mg/dL — ABNORMAL HIGH (ref 0–99)
Total CHOL/HDL Ratio: 5.6 RATIO
Triglycerides: 259 mg/dL — ABNORMAL HIGH (ref <150)
VLDL: 52 mg/dL — ABNORMAL HIGH (ref 0–40)

## 2013-05-02 LAB — PROTIME-INR
INR: 0.95 (ref 0.00–1.49)
Prothrombin Time: 12.5 seconds (ref 11.6–15.2)

## 2013-05-02 LAB — PLATELET INHIBITION P2Y12: Platelet Function  P2Y12: 331 [PRU] (ref 194–418)

## 2013-05-02 LAB — GLUCOSE, CAPILLARY: Glucose-Capillary: 193 mg/dL — ABNORMAL HIGH (ref 70–99)

## 2013-05-02 MED ORDER — SODIUM CHLORIDE 0.9 % IJ SOLN
3.0000 mL | Freq: Two times a day (BID) | INTRAMUSCULAR | Status: DC
  Administered 2013-05-02: 3 mL via INTRAVENOUS

## 2013-05-02 MED ORDER — SODIUM CHLORIDE 0.9 % IV SOLN: 250.0000 mL | INTRAVENOUS | Status: DC | PRN

## 2013-05-02 MED ORDER — SODIUM CHLORIDE 0.9 % IJ SOLN: 3.0000 mL | INTRAMUSCULAR | Status: DC | PRN

## 2013-05-02 MED ORDER — ACETAMINOPHEN 325 MG PO TABS
ORAL_TABLET | ORAL | Status: AC
  Filled 2013-05-02: qty 2

## 2013-05-02 MED ORDER — TECHNETIUM TC 99M SESTAMIBI GENERIC - CARDIOLITE
30.0000 | Freq: Once | INTRAVENOUS | Status: AC | PRN
  Administered 2013-05-02: 30 via INTRAVENOUS

## 2013-05-02 MED ORDER — TECHNETIUM TC 99M SESTAMIBI GENERIC - CARDIOLITE
10.0000 | Freq: Once | INTRAVENOUS | Status: AC | PRN
  Administered 2013-05-02: 10 via INTRAVENOUS

## 2013-05-02 MED ORDER — REGADENOSON 0.4 MG/5ML IV SOLN
INTRAVENOUS | Status: AC
  Administered 2013-05-02: 0.4 mg via INTRAVENOUS
  Filled 2013-05-02: qty 5

## 2013-05-02 MED ORDER — DIAZEPAM 5 MG PO TABS
5.0000 mg | ORAL_TABLET | ORAL | Status: AC
  Administered 2013-05-03: 5 mg via ORAL
  Filled 2013-05-02: qty 1

## 2013-05-02 MED ORDER — SODIUM CHLORIDE 0.9 % IV SOLN
INTRAVENOUS | Status: DC
  Administered 2013-05-03 (×2): via INTRAVENOUS

## 2013-05-02 MED ORDER — ASPIRIN 81 MG PO CHEW
324.0000 mg | CHEWABLE_TABLET | ORAL | Status: AC
  Administered 2013-05-03: 324 mg via ORAL
  Filled 2013-05-02: qty 4

## 2013-05-02 NOTE — Progress Notes (Signed)
SUBJECTIVE:  No further chest pain or SOB  OBJECTIVE:   Vitals:   Filed Vitals:   05/01/13 1355 05/01/13 1832 05/01/13 2210 05/02/13 0636  BP: 123/64 139/71 150/84 133/45  Pulse: 87 90 77 76  Temp: 98.4 F (36.9 C)  97.9 F (36.6 C) 97.8 F (36.6 C)  TempSrc: Oral  Oral Oral  Resp:   18 18  Height:      Weight:      SpO2: 98%  97% 98%   I&O's:   Intake/Output Summary (Last 24 hours) at 05/02/13 1610 Last data filed at 05/01/13 1847  Gross per 24 hour  Intake    575 ml  Output      0 ml  Net    575 ml   TELEMETRY: Reviewed telemetry pt in NSR     PHYSICAL EXAM General: Well developed, well nourished, in no acute distress Head: Eyes PERRLA, No xanthomas.   Normal cephalic and atramatic  Lungs:   Clear bilaterally to auscultation and percussion. Heart:   HRRR S1 S2 Pulses are 2+ & equal. Abdomen: Bowel sounds are positive, abdomen soft and non-tender without masses Extremities:   No clubbing, cyanosis or edema.  DP +1 Neuro: Alert and oriented X 3. Psych:  Good affect, responds appropriately   LABS: Basic Metabolic Panel:  Recent Labs  96/04/54 1258  NA 134*  K 3.6  CL 99  CO2 25  GLUCOSE 215*  BUN 20  CREATININE 0.86  CALCIUM 9.6  MG 1.7   Liver Function Tests:  Recent Labs  05/01/13 1258  AST 12  ALT 13  ALKPHOS 128*  BILITOT 0.3  PROT 7.0  ALBUMIN 3.6   No results found for this basename: LIPASE, AMYLASE,  in the last 72 hours CBC:  Recent Labs  05/01/13 1258  WBC 6.6  NEUTROABS 4.1  HGB 11.6*  HCT 34.6*  MCV 72.2*  PLT 300   Cardiac Enzymes:  Recent Labs  05/01/13 1258 05/01/13 2050 05/01/13 2220  TROPONINI <0.30 <0.30 <0.30   BNP: No components found with this basename: POCBNP,  D-Dimer:  Recent Labs  05/01/13 1258  DDIMER 0.47   Hemoglobin A1C: No results found for this basename: HGBA1C,  in the last 72 hours Fasting Lipid Panel: No results found for this basename: CHOL, HDL, LDLCALC, TRIG, CHOLHDL, LDLDIRECT,   in the last 72 hours Thyroid Function Tests:  Recent Labs  05/01/13 1258  TSH 4.947*   Anemia Panel: No results found for this basename: VITAMINB12, FOLATE, FERRITIN, TIBC, IRON, RETICCTPCT,  in the last 72 hours Coag Panel:   Lab Results  Component Value Date   INR 1.00 05/01/2013   INR 1.08 07/07/2012   INR 0.93 03/20/2010    RADIOLOGY: Dg Chest 2 View  05/01/2013   CLINICAL DATA:  Cough and shortness of Breath  EXAM: CHEST  2 VIEW  COMPARISON:  02/28/2013  FINDINGS: The heart size and mediastinal contours are within normal limits. Both lungs are clear. Spondylosis is noted in the thoracic spine. The visualized skeletal structures are unremarkable.  IMPRESSION: No active cardiopulmonary disease.   Electronically Signed   By: Signa Kell M.D.   On: 05/01/2013 17:23      ASSESSMENT:  1.  Chest pain with negative cardiac markers. 2.  SOB of unclear etiology.  ? Large anxiety component.  She was very anxious and hyperventilating in the office yesterday.  D-Dimer negative.  Chest xray clear.  She is mildly anemic and her TSH  is mildly elevated. 3.  Hypothyroidism - TSH much improved compared to 02/2013 4.  HTN controlled 5.  DM 6.  Anemia 7.  Mildly elevated alk phos  PLAN:   1.  Nuclear stress test to rule out ischemia 2.  2D echo to assess LVF 3.  Outpt workup of anemia 4.  Primary MD to followup on hypothyroidism 5.  If echo ok and stress test with no ischemia then d/c home later today  Quintella Reichert, MD  05/02/2013  8:24 AM

## 2013-05-02 NOTE — Progress Notes (Signed)
*  PRELIMINARY RESULTS* Echocardiogram 2D Echocardiogram has been performed.  [name] 05/02/2013, 9:40 AM

## 2013-05-02 NOTE — Progress Notes (Signed)
Reviewed findings of the nuclear stress test which showed a small area of inducible ischemia in the inferolateral wall.  I have reviewed the findings with the patient and have recommended proceeding the cardiac cath.  Cardiac catheterization was discussed with the patient fully including risks on myocardial infarction, death, stroke, bleeding, arrhythmia, dye allergy, renal insufficiency or bleeding.  All patient questions and concerns were discussed and the patient understands and is willing to proceed.

## 2013-05-03 ENCOUNTER — Encounter (HOSPITAL_COMMUNITY)
Admission: AD | Disposition: A | Discharge status: Home / Self Care | Payer: Self-pay | Source: Ambulatory Visit | Attending: Cardiology

## 2013-05-03 DIAGNOSIS — R9439 Abnormal result of other cardiovascular function study: Secondary | ICD-10-CM | POA: Diagnosis present

## 2013-05-03 LAB — GLUCOSE, CAPILLARY
Glucose-Capillary: 233 mg/dL — ABNORMAL HIGH (ref 70–99)
Glucose-Capillary: 198 mg/dL — ABNORMAL HIGH (ref 70–99)
Glucose-Capillary: 229 mg/dL — ABNORMAL HIGH (ref 70–99)
Glucose-Capillary: 203 mg/dL — ABNORMAL HIGH (ref 70–99)
Glucose-Capillary: 134 mg/dL — ABNORMAL HIGH (ref 70–99)

## 2013-05-03 SURGERY — LEFT HEART CATHETERIZATION WITH CORONARY ANGIOGRAM
Anesthesia: LOCAL

## 2013-05-03 MED ORDER — MIDAZOLAM HCL 2 MG/2ML IJ SOLN
INTRAMUSCULAR | Status: AC
  Filled 2013-05-03: qty 2

## 2013-05-03 MED ORDER — ONDANSETRON HCL 4 MG/2ML IJ SOLN
INTRAMUSCULAR | Status: AC
  Filled 2013-05-03: qty 2

## 2013-05-03 MED ORDER — ONDANSETRON HCL 4 MG/2ML IJ SOLN: 4.0000 mg | Freq: Four times a day (QID) | INTRAMUSCULAR | Status: DC | PRN

## 2013-05-03 MED ORDER — LIDOCAINE HCL (PF) 1 % IJ SOLN
INTRAMUSCULAR | Status: AC
  Filled 2013-05-03: qty 30

## 2013-05-03 MED ORDER — ACETAMINOPHEN 325 MG PO TABS: 650.0000 mg | ORAL_TABLET | ORAL | Status: DC | PRN

## 2013-05-03 MED ORDER — HEPARIN (PORCINE) IN NACL 2-0.9 UNIT/ML-% IJ SOLN
INTRAMUSCULAR | Status: AC
  Filled 2013-05-03: qty 500

## 2013-05-03 MED ORDER — VERAPAMIL HCL 2.5 MG/ML IV SOLN
INTRAVENOUS | Status: AC
  Filled 2013-05-03: qty 2

## 2013-05-03 MED ORDER — NITROGLYCERIN 0.2 MG/ML ON CALL CATH LAB
INTRAVENOUS | Status: AC
  Filled 2013-05-03: qty 1

## 2013-05-03 MED ORDER — HEPARIN (PORCINE) IN NACL 2-0.9 UNIT/ML-% IJ SOLN
INTRAMUSCULAR | Status: AC
  Filled 2013-05-03: qty 1000

## 2013-05-03 MED ORDER — FENTANYL CITRATE 0.05 MG/ML IJ SOLN
INTRAMUSCULAR | Status: AC
  Filled 2013-05-03: qty 2

## 2013-05-03 MED ORDER — SODIUM CHLORIDE 0.9 % IV SOLN
1.0000 mL/kg/h | INTRAVENOUS | Status: AC
  Administered 2013-05-03: 1 mL/kg/h via INTRAVENOUS

## 2013-05-03 NOTE — Interval H&P Note (Signed)
History and Physical Interval Note:  05/03/2013 10:21 AM  [name]  has presented today for surgery, with the diagnosis of Chest pain  The various methods of treatment have been discussed with the patient and family. After consideration of risks, benefits and other options for treatment, the patient has consented to  Procedure(s): LEFT HEART CATHETERIZATION WITH CORONARY ANGIOGRAM (N/A) as a surgical intervention .  The patient's history has been reviewed, patient examined, no change in status, stable for surgery.  I have reviewed the patient's chart and labs.  Questions were answered to the patient's satisfaction.    She was anxious. [name] asked me to perform procedure. NUC stress with lateral defect. Trop neg. Discussed with patient, willing to proceed.   Camala Talwar

## 2013-05-03 NOTE — Progress Notes (Signed)
Pt refused to get new iv site. Pt has phobia of needles.

## 2013-05-03 NOTE — Progress Notes (Signed)
Inpatient Diabetes Program Recommendations  AACE/ADA: New Consensus Statement on Inpatient Glycemic Control (2013)  Target Ranges:  Prepandial:   less than 140 mg/dL      Peak postprandial:   less than 180 mg/dL (1-2 hours)      Critically ill patients:  140 - 180 mg/dL     Results for FRAIDY, MCCARRICK (MRN [id number]) as of 05/03/2013 09:22  Ref. Range 05/02/2013 07:50 05/02/2013 16:18 05/02/2013 21:03  Glucose-Capillary Latest Range: 70-99 mg/dL 409 (H) 811 (H) 914 (H)    Results for ICHELLE, HARRAL (MRN [id number]) as of 05/03/2013 09:22  Ref. Range 05/03/2013 07:52  Glucose-Capillary Latest Range: 70-99 mg/dL 086 (H)    MD- Please consider increasing Lantus to 45 units daily in the AM   Will follow. Ambrose Finland RN, MSN, CDE Diabetes Coordinator Inpatient Diabetes Program Team Pager: [phone] (8a-10p)

## 2013-05-03 NOTE — Discharge Summary (Signed)
Physician Discharge Summary  Patient ID: [name] MRN: [id number] DOB/AGE: Mar 18, 1951 62 y.o.  Admit date: 05/01/2013 Discharge date: 05/03/2013  Admission Diagnoses: Atypical chest pain  Discharge Diagnoses:  Principal Problem:   Atypical chest pain Active Problems:   Type II diabetes mellitus   Hypertension   SOB (shortness of breath)   Other nonspecific abnormal cardiovascular system function study   Discharged Condition: Good  Hospital Course: She was admitted on 05/01/13 with episodes of shortness of breath, chest tightness, severe nausea, anxiety. She underwent nuclear stress test which showed  possible apical lateral ischemia. EF 63%. Because of this, cardiac catheterization took place. There was no angiographically significant coronary artery disease. Ejection fraction was normal. Reassuring. This was performed via the radial artery approach. She has recovered well postoperatively. She is eager to be discharged.   Discharge Exam: Blood pressure 143/98, pulse 80, temperature 98.1 F (36.7 C), temperature source Oral, resp. rate 18, height 5\' 3"  (1.6 m), weight 89.132 kg (196 lb 8 oz), SpO2 100.00%.  Alert and oriented x3, heart is regular rate and rhythm, lungs are clear, catheterization site clean and intact, extremities normal  Disposition: 01-Home or Self Care  Discharge Orders   Future Appointments Provider Department Dept Phone   05/09/2013 3:15 PM Ralene Bathe [facility] [phone]   05/30/2013 2:15 PM Quintella Reichert, MD [facility] [phone]   Future Orders Complete By Expires   Diet - low sodium heart healthy  As directed    Increase activity slowly  As directed        Medication List         acetaminophen 500 MG tablet  Commonly known as:  TYLENOL  Take 1,000 mg by mouth every 6 (six) hours as needed for pain. pain     aspirin EC 81 MG tablet  Take 81 mg by mouth daily.     CALCIUM 600 + D PO   Take 1 tablet by mouth daily.     colesevelam 625 MG tablet  Commonly known as:  WELCHOL  Take 1,250 mg by mouth 2 (two) times daily with a meal.     diphenhydrAMINE 25 MG tablet  Commonly known as:  BENADRYL  Take 25 mg by mouth every 6 (six) hours as needed for allergies.     HYDROcodone-acetaminophen 5-325 MG per tablet  Commonly known as:  NORCO/VICODIN  Take 1 tablet by mouth every 4 (four) hours as needed for pain.     insulin aspart 100 UNIT/ML injection  Commonly known as:  novoLOG  Inject 10-30 Units into the skin 3 (three) times daily with meals.     insulin glargine 100 UNIT/ML injection  Commonly known as:  LANTUS  Inject 42 Units into the skin every morning.     insulin lispro 100 UNIT/ML injection  Commonly known as:  HUMALOG  Inject 10 Units into the skin 3 (three) times daily before meals. Take 10 u meal coverage plus SSI: If CBG < 70: Drink juice; CBG 70-120: 0 u; CBG 121-150: 3 u; CBG 151-200: 4 u; CBG 201-250: 7 u; CBG 251-300: 11 u;CBG 301-350: 15 u; CBG 351-400: 20 u; CBG > 400: call MD     ketorolac 10 MG tablet  Commonly known as:  TORADOL  Take 10 mg by mouth every 8 (eight) hours as needed for pain.     levothyroxine 125 MCG tablet  Commonly known as:  SYNTHROID, LEVOTHROID  Take 125 mcg  by mouth daily.     metFORMIN 1000 MG tablet  Commonly known as:  GLUCOPHAGE  Take 1,000 mg by mouth 2 (two) times daily with a meal.     metoprolol succinate 100 MG 24 hr tablet  Commonly known as:  TOPROL-XL  Take 100 mg by mouth daily. Take with or immediately following a meal.     multivitamin with minerals Tabs tablet  Take 1 tablet by mouth daily.     promethazine 25 MG tablet  Commonly known as:  PHENERGAN  Take 25 mg by mouth every 6 (six) hours as needed. nausea     temazepam 30 MG capsule  Commonly known as:  RESTORIL  Take 30 mg by mouth at bedtime as needed for sleep.     valsartan-hydrochlorothiazide 320-12.5 MG per tablet  Commonly known  as:  DIOVAN-HCT  Take 1 tablet by mouth daily.     venlafaxine XR 37.5 MG 24 hr capsule  Commonly known as:  EFFEXOR-XR  Take 37.5 mg by mouth daily.     Vitamin D (Ergocalciferol) 50000 UNITS Caps capsule  Commonly known as:  DRISDOL  TAKE ONE CAPSULE BY MOUTH EVERY OTHER WEEK           Follow-up Information   Follow up with Quintella Reichert, MD On 05/30/2013. (at 2:15pm)    Specialty:  Cardiology   Contact information:   [address] [place] [address] [place] [address] [phone]       SignedDonato Schultz 05/03/2013, 4:54 PM

## 2013-05-03 NOTE — H&P (View-Only) (Signed)
Office Visit     Patient: [name], Furniss Account Number: [id number] Provider: Armanda Magic, MD  DOB: 1950-11-24 Age: 62 Y Sex: Female Date: 05/01/2013  Phone: [phone]   Address: [address] [place], [place]  Pcp: CYNTHIA WHITE          1. PER DR CYNTHIA WHITE FOLLOW UP.        HPI:  General:  The patient presents today for followup of her HTN and SVT. She has not been feeling well for a few days. She has been having dizzy spells along with chills and then diaphoresis. She denies any fever or cough. She is having episodes of SOB that occur at any time. This started this am. She says that her chest feels tight. She says that her chest started feeling tight on Monday. She denies any flu like symptoms. She has been having severe nausea as well with her episodes of dizziness..        ROS:  See HPI, A twelve system review was perfomed at today's visit. For pertinent positives and negatives see HPI.       Medical History: Fibromyalgia, Hypertension, type 2 diabetes mellitus--Dr. Sharl Ma, Endometriosis, Anemia, iron deficiency, Osteoarthritis, back, knee, Insomnia, Hypercholesterolemia, Hypothyroidism--Dr. Sharl Ma, rotator cuff tear--Dr Valentina Gu, Restless leg syndrome, History of fibroids, Kidney stone, vitamin D deficiency, T12 compression fx 11/13, Phlebitis, Hiatal hernia, Dyspepsia, Abdominal pain, Reflux, Ovarian cysts, Fibroids, Arthritis, Degenerative joint disease, Angina, Dysphagia, Depression.        Social History:  General:  Tobacco use cigarettes: Never smoked.  no Smoking.  no Tobacco Exposure.  no Alcohol.  Caffeine: yes, 1 serving per day.  no Recreational drug use.  Exercise: yes, walks, 3 times weekly.  Occupation: retired from [employer] and [employer], Lawyer part-time, retired.  Marital Status: married.  Children: 2.        Medications: Taking Metoprolol Succinate 100 MG Tablet Extended Release 24 Hour TAKE 1 TABLET BY MOUTH EVERY DAY , Taking Venlafaxine  HCl 37.5 MG Tablet 1 tablet daily, Taking Multivitamins Tablet as directed daily, Taking Calcium 600 + D 600-200 MG-UNIT Tablet 2 tablets Once a day, Taking Aspirin 81 MG Tablet Dispersible as directed daily, Taking OneTouch Ultra Test . Strip TEST BLOOD SUGAR 3 TIMES A DAY , Taking Triamcinolone Acetonide 0.1 % Cream 1 application sparingly to affected area Twice a day, Taking BD Insulin Syr Ultrafine II 31G X 5/16 Miscellaneous USE AS DIRECTED 4 TIMES A DAY , Taking Lantus 100 UNIT/ML Solution 42 units Once a day each morning, Taking Humalog 100 UNIT/ML Solution 10 units at meals plus 1 unit for every 30 mg/dl above 401 mg/dl Three times a day, Taking Fluticasone Propionate 50 MCG/ACT Suspension USE 2 SPRAYS IN EACH NOSTRIL ONCE DAILY FOR 7 DAYS , Notes: PRN, Taking Levothyroxine Sodium 125 MCG Tablet TAKE 1 TABLET EVERY MORNING ON EMPTY STOMACH , Taking Temazepam 30 MG Capsule 1 capsule at bedtime- as needed, Taking Valsartan-Hydrochlorothiazide 320-12.5 MG Tablet TAKE 1 TABLET BY MOUTH EVERY DAY , Taking Welchol 625 MG Tablet TAKE 2 TABLETS BY MOUTH TWICE A DAY , Taking Hydrocodone-Acetaminophen 5-325 MG Tablet 1 tablet as needed every 6 hrs, Taking Promethazine HCl 25mg  tablet 1 tablet every 6-8 hours as needed, Taking Nexium 20 MG Packet 1 packet mixed in 15 ml of water as needed, Taking Cyclobenzaprine HCl 10mg  tablet 1 tablet as needed, Taking Meclizine HCl 12.5 MG Tablet 2 tablets q 8 hrs, prn dizzines, Not-Taking/PRN Gabapentin 300 MG Capsule 3 capsules at  bedtime, Notes: On Hold, Medication List reviewed and reconciled with the patient       Allergies: ambien: eating at night, Codeine Sulfate, Penicillin V Potassium: rash and headache: Allergy, Lipitor: myalgias in legs: Side Effects, Lyrica: nauseated, Lunesta: bad taste, Amitriptyline HCl: loopy, Lovaza: stomach pain, Morphine Sulfate.           Vitals: Wt 197.5, Wt change -4.9 lb, Ht 63, BMI 34.98, Pulse sitting 102, BP sitting 119/81, O BP  Lying 126/87, O pulse lying 93, O BP sitting 118/82, O pulse sitting 96, O BP standing 105/83, O pulse standing 102.       Examination:  Cardiology, General:  GENERAL APPEARANCE: pleasant, NAD.  HEENT: unremarkable.  CAROTID UPSTROKE: normal, no bruit.  JVD: flat.  HEART SOUNDS: regular, normal S1, S2, no S3 or S4.  MURMUR: absent.  LUNGS: no rales or wheezes.  ABDOMEN: soft, non tender, positive bowel sounds, no masses felt.  EXTREMITIES: no leg edema.  PERIPHERAL PULSES: 2 plus bilateral.            Assessment:  1. Paroxysmal supraventricular tachycardia - 427.0  2. Benign hypertension - 401.1  3. SOB (shortness of breath) - 786.05  4. Dizziness - 780.4        1. Chest Pain Imaging: EKG NSR     Corson,Danielle 05/01/2013 09:26:13 AM > [name] M 05/01/2013 09:33:26 AM >   Her EKG is nonischemic.  Her chest pain is somewhat atypical but she was in the hospital in July with atypical CP as well.  I will admit her to rule out MI and if enzymes normal plan nuclear stress test in am.  Check d-dimer and get 2D echo to rule out pericardial effusion  2.  SOB Her symptoms are vague and episodic.  No history of LE edema or recent travel.  She carreis a diagnosis of moderate anxiety disorder and is very anxious in the office almost to the point of hyperventilating.  Need to rule out PE, pericardial effusion and coronary ischemia.  3.  Diaphoresis She does not appear to be sweating at all but says that she is very hot and is fanning herself in the office.  4.  Dizziness with mild orthostasis on exam. I cannot tell form her symptoms whether she is having vertigo or orthostatic dizziness.  She describes nausea with it.  I will give her some IVF hydration since she is mildly orthostatic.  5.  History of SVT  6.  HTN    Procedure Codes: 16109 EKG I AND R         Provider: Armanda Magic, MD  Patient: [name], Dickman DOB: February 15, 1951 Date: 05/01/2013

## 2013-05-03 NOTE — CV Procedure (Signed)
CARDIAC CATHETERIZATION  PROCEDURE:  Left heart catheterization with selective coronary angiography, left ventriculogram via the radial artery approach.  INDICATIONS:  62 year old with abnormal NUC mild lat possible ischemia. Atypical CP, anxiety. CATH performed for [name] request.   The risks, benefits, and details of the procedure were explained to the patient, including possibilities of stroke, heart attack, death, renal impairment, arterial damage, bleeding.  The patient verbalized understanding and wanted to proceed.  Informed written consent was obtained.  PROCEDURE TECHNIQUE:  Allen's test was performed pre-and post procedure and was normal. The right radial artery site was prepped and draped in a sterile fashion. One percent lidocaine was used for local anesthesia. Using the modified Seldinger technique a 5 French hydrophilic sheath was inserted into the radial artery without difficulty. 3 mg of verapamil was administered via the sheath. A Judkins right #4 catheter with the guidance of a Versicore wire was placed in the right coronary cusp and selectively cannulated the right coronary artery. After traversing the aortic arch, 4500 units of heparin IV was administered. A Judkins left #3.5 catheter was used to selectively cannulate the left main artery. Multiple views with hand injection of Omnipaque were obtained. Catheter a pigtail catheter was used to cross into the left ventricle, hemodynamics were obtained, and a left ventriculogram was performed in the RAO position with power injection. Following the procedure, sheath was removed, patient was hemodynamically stable, hemostasis was maintained with a Terumo T band.   CONTRAST:  Total of 80 ml.    FLOUROSCOPY TIME: 4.1 min.  COMPLICATIONS:  None.    HEMODYNAMICS:  Aortic pressure was 158/14mmHg; LV systolic pressure was ; LVEDP .  There was no gradient between the left ventricle and aorta.    ANGIOGRAPHIC DATA:    Left  main: No angiographic CAD.  Left anterior descending (LAD):  No angiographic CAD.  Circumflex artery (CIRC):  No angiographic CAD.  Right coronary artery (RCA):  No angiographic CAD.  LEFT VENTRICULOGRAM:  Left ventricular angiogram was done in the 30 RAO projection and revealed normal left ventricular wall motion and systolic function with an estimated ejection fraction of 60%.   IMPRESSIONS:  No angiographically significant CAD Normal left ventricular systolic function.  LVEDP 18 mmHg.  Ejection fraction 60%.  RECOMMENDATION:  Excellent. Reassuring. Will hopeful DC later today.

## 2013-05-08 ENCOUNTER — Encounter: Payer: Self-pay | Admitting: Certified Nurse Midwife

## 2013-05-09 ENCOUNTER — Encounter: Payer: Self-pay | Admitting: Certified Nurse Midwife

## 2013-05-09 ENCOUNTER — Ambulatory Visit (INDEPENDENT_AMBULATORY_CARE_PROVIDER_SITE_OTHER): Payer: 59 | Admitting: Certified Nurse Midwife

## 2013-05-09 ENCOUNTER — Ambulatory Visit: Payer: 59 | Admitting: Certified Nurse Midwife

## 2013-05-09 VITALS — BP 130/82 | HR 76 | Resp 16 | Ht 62.5 in | Wt 196.0 lb

## 2013-05-09 DIAGNOSIS — Z01419 Encounter for gynecological examination (general) (routine) without abnormal findings: Secondary | ICD-10-CM

## 2013-05-09 NOTE — Patient Instructions (Addendum)

## 2013-05-09 NOTE — Progress Notes (Signed)
Patient ID: [name], female   DOB: [birth year], 62 y.o.   MRN: [id number] 62 y.o. G15P2002 Married Caucasian Fe here for annual exam.  Menopausal no HRT. Denies vaginal bleeding or vaginal dryness. Hospitalization for SOB with cardiac cath all negative per patient. Has follow up scheduled with MD in 2 weeks. Lab work showed borderline anemia. Thyroid stable medication, glucose level not stable with Insulin, working with PCP on.  No LMP recorded. Patient is postmenopausal.          Sexually active: no  The current method of family planning is none.    Exercising: no  The patient does not participate in regular exercise at present. Smoker:  no  Health Maintenance: Pap:  03/21/12, WNL, neg HR HPV MMG:  01/04/13, BI-Rads 1: negative Colonoscopy:  2009, repeat 5 years BMD:   01/07/13, Normal TDaP:  UTD Labs: PCP   reports that she has never smoked. She has never used smokeless tobacco. She reports that she does not drink alcohol or use illicit drugs.  Past Medical History  Diagnosis Date  . Diabetes mellitus   . Hypertension   . Hypothyroidism   . PONV (postoperative nausea and vomiting)   . Blood transfusion   . UTI (lower urinary tract infection)   . GERD (gastroesophageal reflux disease)     occ  . Fibromyalgia   . Arthritis   . Depression   . DVT (deep venous thrombosis)     times 2 lower leg  . Hiatal hernia   . Diverticulitis     Past Surgical History  Procedure Laterality Date  . Cholecystectomy    . Appendectomy    . Orthopedic surgeries      multiple  . Cesarean section      x 2  . Knee arthroplasty  09    lft partial  . Knee arthroplasty      rt  . Shoulder open rotator cuff repair      rt and lft  . Breast surgery      breast reduction  . Dorsal compartment release  06/05/2012    Procedure: RELEASE DORSAL COMPARTMENT (DEQUERVAIN);  Surgeon: Wyn Forster., MD;  Location: [facility];  Service: Orthopedics;  Laterality: Left;  Excision of  mixoid cyst also  . Carpal tunnel release  06/05/2012    Procedure: CARPAL TUNNEL RELEASE;  Surgeon: Wyn Forster., MD;  Location: [facility];  Service: Orthopedics;  Laterality: Left;  . Back surgery  ,2005    April  2013 - spinal fusion@ [facility]  . Tubal ligation      btsp    Current Outpatient Prescriptions  Medication Sig Dispense Refill  . acetaminophen (TYLENOL) 500 MG tablet Take 1,000 mg by mouth every 6 (six) hours as needed for pain. pain      . aspirin EC 81 MG tablet Take 81 mg by mouth daily.      . Calcium Carbonate-Vitamin D (CALCIUM 600 + D PO) Take 1 tablet by mouth daily.       . colesevelam (WELCHOL) 625 MG tablet Take 1,250 mg by mouth 2 (two) times daily with a meal.       . diphenhydrAMINE (BENADRYL) 25 MG tablet Take 25 mg by mouth every 6 (six) hours as needed for allergies.      Marland Kitchen HYDROcodone-acetaminophen (NORCO/VICODIN) 5-325 MG per tablet Take 1 tablet by mouth every 4 (four) hours as needed for pain.      Marland Kitchen  insulin glargine (LANTUS) 100 UNIT/ML injection Inject 42 Units into the skin every morning.       . insulin lispro (HUMALOG) 100 UNIT/ML injection Take 10 u meal coverage plus SSI: If CBG < 70: Drink juice; CBG 70-120: 0 u; CBG 121-150: 3 u; CBG 151-200: 4 u; CBG 201-250: 7 u; CBG 251-300: 11 u;CBG 301-350: 15 u; CBG 351-400: 20 u; CBG > 400: call MD      . levothyroxine (SYNTHROID, LEVOTHROID) 125 MCG tablet Take 125 mcg by mouth daily.       . metFORMIN (GLUCOPHAGE) 1000 MG tablet Take 1,000 mg by mouth 2 (two) times daily with a meal.      . metoprolol succinate (TOPROL-XL) 100 MG 24 hr tablet Take 100 mg by mouth daily. Take with or immediately following a meal.      . Multiple Vitamin (MULITIVITAMIN WITH MINERALS) TABS Take 1 tablet by mouth daily.       . promethazine (PHENERGAN) 25 MG tablet Take 25 mg by mouth every 6 (six) hours as needed. nausea      . temazepam (RESTORIL) 30 MG capsule Take 30 mg by mouth at bedtime as needed for  sleep.       . valsartan-hydrochlorothiazide (DIOVAN-HCT) 320-12.5 MG per tablet Take 1 tablet by mouth daily.      Marland Kitchen venlafaxine (EFFEXOR-XR) 37.5 MG 24 hr capsule Take 37.5 mg by mouth daily.      . Vitamin D, Ergocalciferol, (DRISDOL) 50000 UNITS CAPS capsule TAKE ONE CAPSULE BY MOUTH EVERY OTHER WEEK  6 capsule  0   No current facility-administered medications for this visit.    Family History  Problem Relation Age of Onset  . Heart attack Father     64s  . Hypothyroidism Father   . Diabetes type II Father   . Diabetes type II Mother   . Hypertension Mother   . Breast cancer Mother 23  . Hypothyroidism Brother     ROS:  Pertinent items are noted in HPI.  Otherwise, a comprehensive ROS was negative.  Exam:   BP 130/82  Pulse 76  Resp 16  Ht 5' 2.5" (1.588 m)  Wt 196 lb (88.905 kg)  BMI 35.26 kg/m2 Height: 5' 2.5" (158.8 cm)  Ht Readings from Last 3 Encounters:  05/09/13 5' 2.5" (1.588 m)  05/01/13 5\' 3"  (1.6 m)  05/01/13 5\' 3"  (1.6 m)    General appearance: alert, cooperative and appears stated age Head: Normocephalic, without obvious abnormality, atraumatic Neck: no adenopathy, supple, symmetrical, trachea midline and thyroid normal to inspection and palpation Lungs: clear to auscultation bilaterally Breasts: normal appearance, no masses or tenderness, No nipple retraction or dimpling, No nipple discharge or bleeding, No axillary or supraclavicular adenopathy Heart: regular rate and rhythm Abdomen: soft, non-tender; no masses,  no organomegaly Extremities: extremities normal, atraumatic, no cyanosis or edema Skin: Skin color, texture, turgor normal. No rashes or lesions Lymph nodes: Cervical, supraclavicular, and axillary nodes normal. No abnormal inguinal nodes palpated Neurologic: Grossly normal   Pelvic: External genitalia:  no lesions              Urethra:  normal appearing urethra with no masses, tenderness or lesions              Bartholin's and Skene's:  normal                 Vagina: normal appearing vagina with normal color and discharge, no lesions  Cervix: normal, nontender              Pap taken: no Bimanual Exam:  Uterus:  normal size, contour, position, consistency, mobility, non-tender              Adnexa: normal adnexa and no mass, fullness, tenderness               Rectovaginal: Confirms               Anus:  normal sphincter tone, no lesions  A:  Well Woman with normal exam  Menopausal no HRT  AOIDDM unstable at present with Endocrine management  Recent cardiac evaluation for SOB all negative under follow up  P:   Reviewed health and wellness pertinent to exam  Patient aware of need for evaluation if vaginal bleeding  Continue follow up as indicated  Pap smear as per guidelines   Mammogram yearly pap smear not taken today  counseled on breast self exam, mammography screening, adequate intake of calcium and vitamin D, diet and exercise  return annually or prn  An After Visit Summary was printed and given to the patient.

## 2013-05-09 NOTE — Progress Notes (Signed)
Note reviewed, agree with plan.  Joseline Mccampbell, MD  

## 2013-05-30 ENCOUNTER — Ambulatory Visit (INDEPENDENT_AMBULATORY_CARE_PROVIDER_SITE_OTHER): Payer: 59 | Admitting: Cardiology

## 2013-05-30 ENCOUNTER — Encounter: Payer: Self-pay | Admitting: Cardiology

## 2013-05-30 VITALS — BP 120/70 | HR 87 | Ht 63.0 in | Wt 196.0 lb

## 2013-05-30 DIAGNOSIS — I351 Nonrheumatic aortic (valve) insufficiency: Secondary | ICD-10-CM | POA: Insufficient documentation

## 2013-05-30 DIAGNOSIS — I5189 Other ill-defined heart diseases: Secondary | ICD-10-CM

## 2013-05-30 DIAGNOSIS — I10 Essential (primary) hypertension: Secondary | ICD-10-CM

## 2013-05-30 DIAGNOSIS — R0602 Shortness of breath: Secondary | ICD-10-CM

## 2013-05-30 DIAGNOSIS — I359 Nonrheumatic aortic valve disorder, unspecified: Secondary | ICD-10-CM

## 2013-05-30 DIAGNOSIS — D649 Anemia, unspecified: Secondary | ICD-10-CM | POA: Insufficient documentation

## 2013-05-30 DIAGNOSIS — I519 Heart disease, unspecified: Secondary | ICD-10-CM

## 2013-05-30 DIAGNOSIS — R0789 Other chest pain: Secondary | ICD-10-CM

## 2013-05-30 NOTE — Patient Instructions (Signed)
Your physician wants you to follow-up in: 6 months with Dr. Turner. You will receive a reminder letter in the mail two months in advance. If you don't receive a letter, please call our office to schedule the follow-up appointment.  

## 2013-05-30 NOTE — Progress Notes (Signed)
[address] [place], [place]  [address] Phone: [phone] Fax:  [phone]  Date:  05/30/2013   ID:  [name], DOB May 12, 1951, MRN [id number]  PCP:  [name], MD  Cardiologist:  Armanda Magic, MD     History of Present Illness: [name] is a 62 y.o. female with a history of HTN, SVT and dyslipidemia who recently saw me for problems with SOB and dizziness.  She also was having chest tightness.  She had severe nausea with the episodes of dizziness. She was very anxious at the OV and was hyperventilating.  She was admitted to [facility] and ruled out for MI.  A nuclear stress test was done with showed a very small area of possible apical lateral ischemia and EF was 63%.  She underwent cardiac cath which showed no significant obstructive ASCAD and EF was normal.  BNP was normal and a d-d imer was normal as well.  She now presents today for followup.  2D echo showed diastolic dysfunction and normal LVF.  She is doing well.  She still occasionally has some mild CP and SOB.  She still has some mild vertigo symptoms with nausea that improved with meclizine yesterday.   Wt Readings from Last 3 Encounters:  05/09/13 196 lb (88.905 kg)  05/01/13 196 lb 8 oz (89.132 kg)  05/01/13 196 lb 8 oz (89.132 kg)     Past Medical History  Diagnosis Date  . Diabetes mellitus   . Hypertension   . Hypothyroidism   . PONV (postoperative nausea and vomiting)   . Blood transfusion   . UTI (lower urinary tract infection)   . GERD (gastroesophageal reflux disease)     occ  . Fibromyalgia   . Arthritis   . Depression   . DVT (deep venous thrombosis)     times 2 lower leg  . Hiatal hernia   . Diverticulitis   . Endometriosis   . Anemia     iron deficiency  . Osteoarthritis     back and knee  . Insomnia   . Hyperlipidemia   . RLS (restless legs syndrome)   . Uterine fibroid   . Nephrolithiasis   . Vitamin D deficiency disease   . Thoracic compression fracture   . Ovarian  cyst   . DJD (degenerative joint disease)     Current Outpatient Prescriptions  Medication Sig Dispense Refill  . acetaminophen (TYLENOL) 500 MG tablet Take 1,000 mg by mouth every 6 (six) hours as needed for pain. pain      . aspirin EC 81 MG tablet Take 81 mg by mouth daily.      . Calcium Carbonate-Vitamin D (CALCIUM 600 + D PO) Take 1 tablet by mouth daily.       . colesevelam (WELCHOL) 625 MG tablet Take 1,250 mg by mouth 2 (two) times daily with a meal.       . diphenhydrAMINE (BENADRYL) 25 MG tablet Take 25 mg by mouth every 6 (six) hours as needed for allergies.      Marland Kitchen HYDROcodone-acetaminophen (NORCO/VICODIN) 5-325 MG per tablet Take 1 tablet by mouth every 4 (four) hours as needed for pain.      Marland Kitchen insulin glargine (LANTUS) 100 UNIT/ML injection Inject 42 Units into the skin every morning.       . insulin lispro (HUMALOG) 100 UNIT/ML injection Take 10 u meal coverage plus SSI: If CBG < 70: Drink juice; CBG 70-120: 0 u; CBG 121-150: 3  u; CBG 151-200: 4 u; CBG 201-250: 7 u; CBG 251-300: 11 u;CBG 301-350: 15 u; CBG 351-400: 20 u; CBG > 400: call MD      . levothyroxine (SYNTHROID, LEVOTHROID) 125 MCG tablet Take 125 mcg by mouth daily.       . metFORMIN (GLUCOPHAGE) 1000 MG tablet Take 1,000 mg by mouth 2 (two) times daily with a meal.      . metoprolol succinate (TOPROL-XL) 100 MG 24 hr tablet Take 100 mg by mouth daily. Take with or immediately following a meal.      . Multiple Vitamin (MULITIVITAMIN WITH MINERALS) TABS Take 1 tablet by mouth daily.       . promethazine (PHENERGAN) 25 MG tablet Take 25 mg by mouth every 6 (six) hours as needed. nausea      . temazepam (RESTORIL) 30 MG capsule Take 30 mg by mouth at bedtime as needed for sleep.       . valsartan-hydrochlorothiazide (DIOVAN-HCT) 320-12.5 MG per tablet Take 1 tablet by mouth daily.      Marland Kitchen venlafaxine (EFFEXOR-XR) 37.5 MG 24 hr capsule Take 37.5 mg by mouth daily.      . Vitamin D, Ergocalciferol, (DRISDOL) 50000 UNITS CAPS  capsule TAKE ONE CAPSULE BY MOUTH EVERY OTHER WEEK  6 capsule  0   No current facility-administered medications for this visit.    Allergies:    Allergies  Allergen Reactions  . Ambien [Zolpidem Tartrate] Other (See Comments)    Up sleep walking and eating  . Crestor [Rosuvastatin] Other (See Comments)    Muscle weakness, cramps and aching all over body  . Lipitor [Atorvastatin] Itching and Other (See Comments)    Muscle weakness, cramps and aches all over body  . Morphine And Related Other (See Comments)    Flushing and feeling hot  . Statins Other (See Comments)    Muscle weakness, cramps and aching all over body  . Amitriptyline     Loopy feeling  . Lovaza [Omega-3-Acid Ethyl Esters]   . Lyrica [Pregabalin] Nausea Only  . Codeine Nausea And Vomiting  . Lunesta [Eszopiclone] Other (See Comments)    Causes dizziness  . Penicillins Rash and Other (See Comments)    Caused Headaches also     Social History:  The patient  reports that she has never smoked. She has never used smokeless tobacco. She reports that she does not drink alcohol or use illicit drugs.   Family History:  The patient's family history includes Breast cancer (age of onset: 26) in her mother; Diabetes type II in her father and mother; Heart attack in her father; Hypertension in her mother; Hypothyroidism in her brother and father.   ROS:  Please see the history of present illness.      All other systems reviewed and negative.   PHYSICAL EXAM: VS:  There were no vitals taken for this visit. Well nourished, well developed, in no acute distress HEENT: normal Neck: no JVD Cardiac:  normal S1, S2; RRR; no murmur Lungs:  clear to auscultation bilaterally, no wheezing, rhonchi or rales Abd: soft, nontender, no hepatomegaly Ext: no edema Skin: warm and dry Neuro:  CNs 2-12 intact, no focal abnormalities noted    ASSESSMENT AND PLAN:  1. SVT  - Continue beta blocker 2. HTN  - Continue Toprol/Diovan  HCT 3. Noncardiac CP with normal cath recently and negative d-dimer 4. Diastolic dysfunction by echo- continue diuretic  Followup with me in 6 months  Signed, Armanda Magic, MD  05/30/2013 1:36 PM

## 2013-06-10 ENCOUNTER — Other Ambulatory Visit: Payer: Self-pay | Admitting: Gastroenterology

## 2013-06-10 DIAGNOSIS — R110 Nausea: Secondary | ICD-10-CM

## 2013-06-13 ENCOUNTER — Ambulatory Visit
Admission: RE | Admit: 2013-06-13 | Discharge: 2013-06-13 | Disposition: A | Discharge status: Home / Self Care | Payer: 59 | Source: Ambulatory Visit | Attending: Gastroenterology | Admitting: Gastroenterology

## 2013-06-13 DIAGNOSIS — R110 Nausea: Secondary | ICD-10-CM

## 2013-07-22 ENCOUNTER — Other Ambulatory Visit: Payer: Self-pay | Admitting: Certified Nurse Midwife

## 2013-07-22 NOTE — Telephone Encounter (Signed)
AEX: 05/09/13 no rx was given Last Refilled: 04/22/13 #6/0refills was sent to [facility] Last level checked 04/19/12 at 47  Given New Vitamin D Protocol please advise.

## 2013-07-22 NOTE — Telephone Encounter (Signed)
Have patient start on daily 1000 IU Vitamin D 3 now due to protocol change

## 2013-07-24 NOTE — Telephone Encounter (Signed)
Patient notified says she already takes Vitamin D 1,0000 daily will continue as she has been.

## 2013-08-05 ENCOUNTER — Other Ambulatory Visit: Payer: Self-pay | Admitting: Family Medicine

## 2013-08-05 ENCOUNTER — Ambulatory Visit
Admission: RE | Admit: 2013-08-05 | Discharge: 2013-08-05 | Disposition: A | Discharge status: Home / Self Care | Payer: 59 | Source: Ambulatory Visit | Attending: Family Medicine | Admitting: Family Medicine

## 2013-08-05 DIAGNOSIS — M549 Dorsalgia, unspecified: Secondary | ICD-10-CM

## 2013-10-08 ENCOUNTER — Other Ambulatory Visit: Payer: Self-pay | Admitting: Neurosurgery

## 2013-10-08 DIAGNOSIS — M47817 Spondylosis without myelopathy or radiculopathy, lumbosacral region: Secondary | ICD-10-CM

## 2013-10-09 ENCOUNTER — Other Ambulatory Visit: Payer: 59

## 2013-10-09 ENCOUNTER — Inpatient Hospital Stay
Admission: RE | Admit: 2013-10-09 | Discharge: 2013-10-09 | Disposition: A | Discharge status: Home / Self Care | Payer: 59 | Source: Ambulatory Visit | Attending: Neurosurgery | Admitting: Neurosurgery

## 2013-10-09 NOTE — Discharge Instructions (Addendum)
Myelogram Discharge Instructions  1. Go home and rest quietly for the next 24 hours.  It is important to lie flat for the next 24 hours.  Get up only to go to the restroom.  You may lie in the bed or on a couch on your back, your stomach, your left side or your right side.  You may have one pillow under your head.  You may have pillows between your knees while you are on your side or under your knees while you are on your back.  2. DO NOT drive today.  Recline the seat as far back as it will go, while still wearing your seat belt, on the way home.  3. You may get up to go to the bathroom as needed.  You may sit up for 10 minutes to eat.  You may resume your normal diet and medications unless otherwise indicated.  Drink lots of extra fluids today and tomorrow.  4. The incidence of headache, nausea, or vomiting is about 5% (one in 20 patients).  If you develop a headache, lie flat and drink plenty of fluids until the headache goes away.  Caffeinated beverages may be helpful.  If you develop severe nausea and vomiting or a headache that does not go away with flat bed rest, call [phone].  5. You may resume normal activities after your 24 hours of bed rest is over; however, do not exert yourself strongly or do any heavy lifting tomorrow. If when you get up you have a headache when standing, go back to bed and force fluids for another 24 hours.  6. Call your physician for a follow-up appointment.  The results of your myelogram will be sent directly to your physician by the following day.  7. If you have any questions or if complications develop after you arrive home, please call [phone].  Discharge instructions have been explained to the patient.  The patient, or the person responsible for the patient, fully understands these instructions.      May resume effexor/venlafaxine and phenergan on October 10, 2013, after 1:00 pm.

## 2013-10-14 ENCOUNTER — Ambulatory Visit
Admission: RE | Admit: 2013-10-14 | Discharge: 2013-10-14 | Disposition: A | Discharge status: Home / Self Care | Payer: 59 | Source: Ambulatory Visit | Attending: Neurosurgery | Admitting: Neurosurgery

## 2013-10-14 VITALS — BP 149/81 | HR 80

## 2013-10-14 DIAGNOSIS — M47817 Spondylosis without myelopathy or radiculopathy, lumbosacral region: Secondary | ICD-10-CM

## 2013-10-14 MED ORDER — HYDROMORPHONE HCL PF 2 MG/ML IJ SOLN
2.0000 mg | Freq: Once | INTRAMUSCULAR | Status: AC
  Administered 2013-10-14: 2 mg via INTRAMUSCULAR

## 2013-10-14 MED ORDER — ONDANSETRON HCL 4 MG/2ML IJ SOLN
4.0000 mg | Freq: Once | INTRAMUSCULAR | Status: AC
  Administered 2013-10-14: 4 mg via INTRAMUSCULAR

## 2013-10-14 MED ORDER — IOHEXOL 300 MG/ML  SOLN
10.0000 mL | Freq: Once | INTRAMUSCULAR | Status: AC | PRN
Start: 2013-10-14 — End: 2013-10-14
  Administered 2013-10-14: 10 mL via INTRATHECAL

## 2013-10-14 MED ORDER — DIAZEPAM 5 MG PO TABS
10.0000 mg | ORAL_TABLET | Freq: Once | ORAL | Status: AC
  Administered 2013-10-14: 10 mg via ORAL

## 2013-10-14 NOTE — Discharge Instructions (Signed)
Myelogram Discharge Instructions  1. Go home and rest quietly for the next 24 hours.  It is important to lie flat for the next 24 hours.  Get up only to go to the restroom.  You may lie in the bed or on a couch on your back, your stomach, your left side or your right side.  You may have one pillow under your head.  You may have pillows between your knees while you are on your side or under your knees while you are on your back.  2. DO NOT drive today.  Recline the seat as far back as it will go, while still wearing your seat belt, on the way home.  3. You may get up to go to the bathroom as needed.  You may sit up for 10 minutes to eat.  You may resume your normal diet and medications unless otherwise indicated.  Drink lots of extra fluids today and tomorrow.  4. The incidence of headache, nausea, or vomiting is about 5% (one in 20 patients).  If you develop a headache, lie flat and drink plenty of fluids until the headache goes away.  Caffeinated beverages may be helpful.  If you develop severe nausea and vomiting or a headache that does not go away with flat bed rest, call [phone].  5. You may resume normal activities after your 24 hours of bed rest is over; however, do not exert yourself strongly or do any heavy lifting tomorrow. If when you get up you have a headache when standing, go back to bed and force fluids for another 24 hours.  6. Call your physician for a follow-up appointment.  The results of your myelogram will be sent directly to your physician by the following day.  7. If you have any questions or if complications develop after you arrive home, please call [phone].  Discharge instructions have been explained to the patient.  The patient, or the person responsible for the patient, fully understands these instructions.      May resume effexor and phenergan on October 15, 2013, after 09:30 am.

## 2013-10-14 NOTE — Progress Notes (Signed)
Pt has been off phenergan and effexor for the past 2-3 days.

## 2013-10-14 NOTE — Progress Notes (Signed)
Patient with continued nausea since myelogram completed despite being medicated for pain and nausea.  Medicated again for nausea.  jkl

## 2013-11-22 ENCOUNTER — Encounter: Payer: Self-pay | Admitting: Cardiology

## 2014-01-02 ENCOUNTER — Other Ambulatory Visit: Payer: Self-pay

## 2014-01-02 DIAGNOSIS — Z1231 Encounter for screening mammogram for malignant neoplasm of breast: Secondary | ICD-10-CM

## 2014-01-13 ENCOUNTER — Ambulatory Visit
Admission: RE | Admit: 2014-01-13 | Discharge: 2014-01-13 | Disposition: A | Discharge status: Home / Self Care | Payer: 59 | Source: Ambulatory Visit

## 2014-01-13 DIAGNOSIS — Z1231 Encounter for screening mammogram for malignant neoplasm of breast: Secondary | ICD-10-CM

## 2014-03-20 ENCOUNTER — Other Ambulatory Visit: Payer: Self-pay | Admitting: Certified Nurse Midwife

## 2014-03-20 NOTE — Telephone Encounter (Signed)
Last AEX 05/2013 No future appt   Please advise.

## 2014-06-09 ENCOUNTER — Encounter: Payer: Self-pay | Admitting: Cardiology

## 2014-10-28 ENCOUNTER — Emergency Department (HOSPITAL_COMMUNITY)
Admission: EM | Admit: 2014-10-28 | Discharge: 2014-10-29 | Disposition: A | Discharge status: Home / Self Care | Payer: 59 | Attending: Emergency Medicine | Admitting: Emergency Medicine

## 2014-10-28 ENCOUNTER — Encounter (HOSPITAL_COMMUNITY): Payer: Self-pay | Admitting: Emergency Medicine

## 2014-10-28 DIAGNOSIS — G4700 Insomnia, unspecified: Secondary | ICD-10-CM | POA: Diagnosis not present

## 2014-10-28 DIAGNOSIS — M797 Fibromyalgia: Secondary | ICD-10-CM | POA: Insufficient documentation

## 2014-10-28 DIAGNOSIS — Z880 Allergy status to penicillin: Secondary | ICD-10-CM | POA: Insufficient documentation

## 2014-10-28 DIAGNOSIS — Z87440 Personal history of urinary (tract) infections: Secondary | ICD-10-CM | POA: Insufficient documentation

## 2014-10-28 DIAGNOSIS — Z7951 Long term (current) use of inhaled steroids: Secondary | ICD-10-CM | POA: Diagnosis not present

## 2014-10-28 DIAGNOSIS — Z794 Long term (current) use of insulin: Secondary | ICD-10-CM | POA: Diagnosis not present

## 2014-10-28 DIAGNOSIS — Z86718 Personal history of other venous thrombosis and embolism: Secondary | ICD-10-CM | POA: Insufficient documentation

## 2014-10-28 DIAGNOSIS — R51 Headache: Secondary | ICD-10-CM | POA: Insufficient documentation

## 2014-10-28 DIAGNOSIS — Z862 Personal history of diseases of the blood and blood-forming organs and certain disorders involving the immune mechanism: Secondary | ICD-10-CM | POA: Insufficient documentation

## 2014-10-28 DIAGNOSIS — Z7982 Long term (current) use of aspirin: Secondary | ICD-10-CM | POA: Diagnosis not present

## 2014-10-28 DIAGNOSIS — Z8719 Personal history of other diseases of the digestive system: Secondary | ICD-10-CM | POA: Insufficient documentation

## 2014-10-28 DIAGNOSIS — Z8742 Personal history of other diseases of the female genital tract: Secondary | ICD-10-CM | POA: Diagnosis not present

## 2014-10-28 DIAGNOSIS — M1990 Unspecified osteoarthritis, unspecified site: Secondary | ICD-10-CM | POA: Insufficient documentation

## 2014-10-28 DIAGNOSIS — E119 Type 2 diabetes mellitus without complications: Secondary | ICD-10-CM | POA: Insufficient documentation

## 2014-10-28 DIAGNOSIS — F329 Major depressive disorder, single episode, unspecified: Secondary | ICD-10-CM | POA: Insufficient documentation

## 2014-10-28 DIAGNOSIS — E039 Hypothyroidism, unspecified: Secondary | ICD-10-CM | POA: Diagnosis not present

## 2014-10-28 DIAGNOSIS — Z87442 Personal history of urinary calculi: Secondary | ICD-10-CM | POA: Insufficient documentation

## 2014-10-28 DIAGNOSIS — R519 Headache, unspecified: Secondary | ICD-10-CM

## 2014-10-28 DIAGNOSIS — H9202 Otalgia, left ear: Secondary | ICD-10-CM | POA: Diagnosis not present

## 2014-10-28 DIAGNOSIS — M542 Cervicalgia: Secondary | ICD-10-CM | POA: Diagnosis not present

## 2014-10-28 DIAGNOSIS — E559 Vitamin D deficiency, unspecified: Secondary | ICD-10-CM | POA: Insufficient documentation

## 2014-10-28 DIAGNOSIS — I10 Essential (primary) hypertension: Secondary | ICD-10-CM | POA: Insufficient documentation

## 2014-10-28 LAB — I-STAT CHEM 8, ED
BUN: 18 mg/dL (ref 6–23)
CALCIUM ION: 1.17 mmol/L (ref 1.13–1.30)
Chloride: 103 mmol/L (ref 96–112)
Creatinine, Ser: 1.1 mg/dL (ref 0.50–1.10)
GLUCOSE: 121 mg/dL — AB (ref 70–99)
HCT: 37 % (ref 36.0–46.0)
HEMOGLOBIN: 12.6 g/dL (ref 12.0–15.0)
POTASSIUM: 3.5 mmol/L (ref 3.5–5.1)
SODIUM: 138 mmol/L (ref 135–145)
TCO2: 19 mmol/L (ref 0–100)

## 2014-10-28 LAB — CBC WITH DIFFERENTIAL/PLATELET
BASOS PCT: 1 % (ref 0–1)
Basophils Absolute: 0.1 10*3/uL (ref 0.0–0.1)
EOS ABS: 0.2 10*3/uL (ref 0.0–0.7)
EOS PCT: 2 % (ref 0–5)
HCT: 36.3 % (ref 36.0–46.0)
HEMOGLOBIN: 11.2 g/dL — AB (ref 12.0–15.0)
LYMPHS PCT: 45 % (ref 12–46)
Lymphs Abs: 3.6 10*3/uL (ref 0.7–4.0)
MCH: 21.2 pg — AB (ref 26.0–34.0)
MCHC: 30.9 g/dL (ref 30.0–36.0)
MCV: 68.8 fL — ABNORMAL LOW (ref 78.0–100.0)
Monocytes Absolute: 0.4 10*3/uL (ref 0.1–1.0)
Monocytes Relative: 5 % (ref 3–12)
Neutro Abs: 3.7 10*3/uL (ref 1.7–7.7)
Neutrophils Relative %: 47 % (ref 43–77)
PLATELETS: 341 10*3/uL (ref 150–400)
RBC: 5.28 MIL/uL — ABNORMAL HIGH (ref 3.87–5.11)
RDW: 16.1 % — ABNORMAL HIGH (ref 11.5–15.5)
WBC: 8 10*3/uL (ref 4.0–10.5)

## 2014-10-28 MED ORDER — HYDROMORPHONE HCL 1 MG/ML IJ SOLN
0.5000 mg | Freq: Once | INTRAMUSCULAR | Status: AC
  Administered 2014-10-29: 0.5 mg via INTRAVENOUS
  Filled 2014-10-28: qty 1

## 2014-10-28 MED ORDER — ONDANSETRON HCL 4 MG/2ML IJ SOLN
4.0000 mg | Freq: Once | INTRAMUSCULAR | Status: DC
  Filled 2014-10-28: qty 2

## 2014-10-28 NOTE — ED Notes (Signed)
Pt c/o severe headache onset earlier today.  St's she was eating a hamburger and when she bit down she developed pain in left side of face.  St's then she developed a headache.  Pt went to [place] walk in clinic and was offered Toradol but refused.  Pt sent to ED from [facility] for futher eval.  Pt also c/o nausea

## 2014-10-29 ENCOUNTER — Encounter (HOSPITAL_COMMUNITY): Payer: Self-pay

## 2014-10-29 ENCOUNTER — Emergency Department (HOSPITAL_COMMUNITY): Payer: 59

## 2014-10-29 MED ORDER — ONDANSETRON HCL 4 MG/2ML IJ SOLN
4.0000 mg | Freq: Once | INTRAMUSCULAR | Status: AC
  Administered 2014-10-29: 4 mg via INTRAVENOUS

## 2014-10-29 MED ORDER — ONDANSETRON HCL 4 MG/2ML IJ SOLN
4.0000 mg | Freq: Once | INTRAMUSCULAR | Status: AC
  Administered 2014-10-29: 4 mg via INTRAVENOUS
  Filled 2014-10-29: qty 2

## 2014-10-29 MED ORDER — IOHEXOL 350 MG/ML SOLN
80.0000 mL | Freq: Once | INTRAVENOUS | Status: AC | PRN
  Administered 2014-10-29: 80 mL via INTRAVENOUS

## 2014-10-29 MED ORDER — HYDROCODONE-ACETAMINOPHEN 5-325 MG PO TABS: 1.0000 | ORAL_TABLET | ORAL | Status: DC | PRN

## 2014-10-29 NOTE — ED Provider Notes (Signed)
CSN: [id number]     Arrival date & time 10/28/14  2155 History   First MD Initiated Contact with Patient 10/28/14 2248     Chief Complaint  Patient presents with  . Headache     (Consider location/radiation/quality/duration/timing/severity/associated sxs/prior Treatment) Patient is a 64 y.o. female presenting with headaches. The history is provided by the patient. No language interpreter was used.  Headache Pain location:  L parietal and L temporal Quality:  Sharp Radiates to:  Does not radiate Severity currently:  8/10 Severity at highest:  8/10 Onset quality:  Gradual Duration:  1 day Timing:  Constant Progression:  Worsening Chronicity:  New Similar to prior headaches: no   Relieved by:  Nothing Worsened by:  Nothing Ineffective treatments:  None tried Associated symptoms: ear pain and facial pain   Associated symptoms: no blurred vision, no dizziness, no numbness, no seizures and no weakness     Past Medical History  Diagnosis Date  . Diabetes mellitus   . Hypertension   . Hypothyroidism   . PONV (postoperative nausea and vomiting)   . Blood transfusion   . UTI (lower urinary tract infection)   . GERD (gastroesophageal reflux disease)     occ  . Fibromyalgia   . Arthritis   . Depression   . DVT (deep venous thrombosis)     times 2 lower leg  . Hiatal hernia   . Diverticulitis   . Endometriosis   . Anemia     iron deficiency  . Osteoarthritis     back and knee  . Insomnia   . Hyperlipidemia   . RLS (restless legs syndrome)   . Uterine fibroid   . Nephrolithiasis   . Vitamin D deficiency disease   . Thoracic compression fracture   . Ovarian cyst   . DJD (degenerative joint disease)   . Diastolic dysfunction   . Mild aortic insufficiency     by echo 04/2013   Past Surgical History  Procedure Laterality Date  . Cholecystectomy    . Appendectomy    . Orthopedic surgeries      multiple  . Cesarean section      x 2  . Knee arthroplasty  09    lft  partial  . Knee arthroplasty      rt  . Shoulder open rotator cuff repair      rt and lft  . Breast surgery      breast reduction  . Dorsal compartment release  06/05/2012    Procedure: RELEASE DORSAL COMPARTMENT (DEQUERVAIN);  Surgeon: Cammie Sickle., MD;  Location: [facility];  Service: Orthopedics;  Laterality: Left;  Excision of mixoid cyst also  . Carpal tunnel release  06/05/2012    Procedure: CARPAL TUNNEL RELEASE;  Surgeon: Cammie Sickle., MD;  Location: [facility];  Service: Orthopedics;  Laterality: Left;  . Back surgery  ,2005    April  2013 - spinal fusion@ [facility]  . Tubal ligation      btsp  . Cardiac catheterization  04/2013    normal coronary arteries and normal LVF   Family History  Problem Relation Age of Onset  . Heart attack Father     54s  . Hypothyroidism Father   . Diabetes type II Father   . Diabetes type II Mother   . Hypertension Mother   . Breast cancer Mother 69  . Hypothyroidism Brother    History  Substance Use Topics  . Smoking  status: Never Smoker   . Smokeless tobacco: Never Used  . Alcohol Use: No   OB History    Gravida Para Term Preterm AB TAB SAB Ectopic Multiple Living   2 2 2       2      Review of Systems  HENT: Positive for ear pain.   Eyes: Negative for blurred vision.  Neurological: Positive for headaches. Negative for dizziness, seizures, facial asymmetry, speech difficulty, weakness and numbness.  All other systems reviewed and are negative. Pt reports she has had a earache since yesterday.  Pt reports today she has had increasing pain left side of face and neck.  Pt reports she bit down on a hamburger and had severe pain shot through her neck and jaw.  Pt went to [place] walk in and was sent here to have evaluation for possible (TIA, Trigeminal neuralgia or possible TMJ)  Pt describes a severe pain to the left anterior neck and lateral neck to her left ear.  Pt reports some tingling to face.   No weakness to face or body.     Allergies  Ambien; Crestor; Lipitor; Morphine and related; Statins; Amitriptyline; Lovaza; Lyrica; Codeine; Lunesta; and Penicillins  Home Medications   Prior to Admission medications   Medication Sig Start Date End Date Taking? Authorizing Provider  acetaminophen (TYLENOL) 500 MG tablet Take 1,000 mg by mouth every 6 (six) hours as needed for pain. pain   Yes Historical Provider, MD  aspirin EC 81 MG tablet Take 81 mg by mouth daily.   Yes Historical Provider, MD  Calcium Carbonate-Vitamin D (CALCIUM 600 + D PO) Take 1 tablet by mouth daily.    Yes Historical Provider, MD  diphenhydrAMINE (BENADRYL) 25 MG tablet Take 25 mg by mouth every 6 (six) hours as needed for allergies.   Yes Historical Provider, MD  empagliflozin (JARDIANCE) 10 MG TABS tablet Take 10 mg by mouth daily.   Yes Historical Provider, MD  FLUoxetine (PROZAC) 20 MG tablet Take 20 mg by mouth daily.   Yes Historical Provider, MD  fluticasone (FLONASE) 50 MCG/ACT nasal spray Place 1 spray into both nostrils daily as needed for allergies or rhinitis.   Yes Historical Provider, MD  gabapentin (NEURONTIN) 300 MG capsule Take 900 mg by mouth at bedtime.   Yes Historical Provider, MD  HYDROcodone-acetaminophen (NORCO/VICODIN) 5-325 MG per tablet Take 1 tablet by mouth every 4 (four) hours as needed for pain.   Yes Historical Provider, MD  insulin glargine (LANTUS) 100 UNIT/ML injection Inject 36 Units into the skin every morning.  06/28/12  Yes Bonnielee Haff, MD  insulin lispro (HUMALOG) 100 UNIT/ML injection Take 10 u meal coverage plus SSI: If CBG < 70: Drink juice; CBG 70-120: 0 u; CBG 121-150: 3 u; CBG 151-200: 4 u; CBG 201-250: 7 u; CBG 251-300: 11 u;CBG 301-350: 15 u; CBG 351-400: 20 u; CBG > 400: call MD 06/25/12  Yes Pattricia Boss, MD  levothyroxine (SYNTHROID, LEVOTHROID) 125 MCG tablet Take 125 mcg by mouth daily.    Yes Historical Provider, MD  metoprolol (TOPROL-XL) 200 MG 24 hr tablet  Take 200 mg by mouth daily.   Yes Historical Provider, MD  Multiple Vitamin (MULITIVITAMIN WITH MINERALS) TABS Take 1 tablet by mouth daily.    Yes Historical Provider, MD  nystatin-triamcinolone (MYCOLOG II) cream APPLY TO AFFECTED AREA TWICE A DAY FOR 5 DAYS Patient taking differently: APPLY TO AFFECTED AREA TWICE A DAY AS NEEDED FOR RASH 03/20/14  Yes Phylis Bougie  Hollice Espy, CNM  promethazine (PHENERGAN) 25 MG tablet Take 25 mg by mouth every 6 (six) hours as needed. nausea   Yes Historical Provider, MD  temazepam (RESTORIL) 30 MG capsule Take 30 mg by mouth at bedtime as needed for sleep.    Yes Historical Provider, MD  valsartan-hydrochlorothiazide (DIOVAN-HCT) 320-12.5 MG per tablet Take 1 tablet by mouth daily.   Yes Historical Provider, MD  colesevelam (WELCHOL) 625 MG tablet Take 1,250 mg by mouth 2 (two) times daily with a meal.     Historical Provider, MD  metFORMIN (GLUCOPHAGE) 1000 MG tablet Take 1,000 mg by mouth 2 (two) times daily with a meal.    Historical Provider, MD  metoprolol succinate (TOPROL-XL) 100 MG 24 hr tablet Take 100 mg by mouth daily. Take with or immediately following a meal.    Historical Provider, MD  venlafaxine (EFFEXOR-XR) 37.5 MG 24 hr capsule Take 37.5 mg by mouth daily.    Historical Provider, MD  Vitamin D, Ergocalciferol, (DRISDOL) 50000 UNITS CAPS capsule TAKE ONE CAPSULE BY MOUTH EVERY OTHER WEEK Patient not taking: Reported on 10/28/2014 04/22/13   Regina Eck, CNM   BP 154/77 mmHg  Pulse 66  Temp(Src) 97.5 F (36.4 C) (Oral)  Resp 17  Ht 5\' 4"  (1.626 m)  Wt 200 lb (90.719 kg)  BMI 34.31 kg/m2  SpO2 98% Physical Exam  Constitutional: She is oriented to person, place, and time. She appears well-developed and well-nourished.  HENT:  Head: Normocephalic and atraumatic.  Right Ear: External ear normal.  Left Ear: External ear normal.  Nose: Nose normal.  Mouth/Throat: Oropharynx is clear and moist.  Eyes: Conjunctivae and EOM are normal. Pupils  are equal, round, and reactive to light.  Neck: Normal range of motion.  Cardiovascular: Normal rate, regular rhythm and normal heart sounds.   Pulmonary/Chest: Effort normal.  Abdominal: Soft. She exhibits no distension.  Musculoskeletal: Normal range of motion.  Neurological: She is alert and oriented to person, place, and time. She displays normal reflexes. No cranial nerve deficit. Coordination normal.  Skin: Skin is warm.  Psychiatric: She has a normal mood and affect.  Nursing note and vitals reviewed.   ED Course  Procedures (including critical care time) Labs Review Labs Reviewed  CBC WITH DIFFERENTIAL/PLATELET - Abnormal; Notable for the following:    RBC 5.28 (*)    Hemoglobin 11.2 (*)    MCV 68.8 (*)    MCH 21.2 (*)    RDW 16.1 (*)    All other components within normal limits  I-STAT CHEM 8, ED - Abnormal; Notable for the following:    Glucose, Bld 121 (*)    All other components within normal limits    Imaging Review No results found.   EKG Interpretation None      MDM  I will obtain cta of head and neck.   I examined skin carefully no sign of shingles,    Final diagnoses:  None     Fransico Meadow, PA-C 10/29/14 [place], DO 10/30/14 [phone]

## 2014-10-29 NOTE — ED Notes (Signed)
Pt returned from CT scan and stated that she was extremely nauseous. PA notified

## 2014-10-29 NOTE — ED Provider Notes (Signed)
0100 - Patient care assumed from Morgan Low, PA-C at shift change. Patient presenting to the ED for further evaluation of L sided headache characterized at L sided neck, ear, and jaw pain. She reports reproducible tenderness on her encounter with the patient. Patient with CT angio head and neck pending to evaluate for acute intracranial pathology. Plan discussed with Threasa Alpha, PA-C which includes d/c with pain medication and PCP f/u if CT imaging negative. No concern for TIA or CVA based off symptoms, per exam of Morgan Low, PA-C.  0315 - Patient's imaging negative for any acute intracranial process or any major arterial vasculature changes within the head or neck. Patient states her symptoms have dulled significantly. She has no focal neurologic deficits on my exam. No fever, nuchal rigidity or meningismus. Plan to d/c with pain medication for management and have advised PCP f/u in 2 days for recheck. Return precautions given. Patient agreeable to plan with no unaddressed concerns. Patient discharged in good condition.   Results for orders placed or performed during the hospital encounter of 10/28/14  CBC with Differential  Result Value Ref Range   WBC 8.0 4.0 - 10.5 K/uL   RBC 5.28 (H) 3.87 - 5.11 MIL/uL   Hemoglobin 11.2 (L) 12.0 - 15.0 g/dL   HCT 36.3 36.0 - 46.0 %   MCV 68.8 (L) 78.0 - 100.0 fL   MCH 21.2 (L) 26.0 - 34.0 pg   MCHC 30.9 30.0 - 36.0 g/dL   RDW 16.1 (H) 11.5 - 15.5 %   Platelets 341 150 - 400 K/uL   Neutrophils Relative % 47 43 - 77 %   Lymphocytes Relative 45 12 - 46 %   Monocytes Relative 5 3 - 12 %   Eosinophils Relative 2 0 - 5 %   Basophils Relative 1 0 - 1 %   Neutro Abs 3.7 1.7 - 7.7 K/uL   Lymphs Abs 3.6 0.7 - 4.0 K/uL   Monocytes Absolute 0.4 0.1 - 1.0 K/uL   Eosinophils Absolute 0.2 0.0 - 0.7 K/uL   Basophils Absolute 0.1 0.0 - 0.1 K/uL   RBC Morphology POLYCHROMASIA PRESENT    WBC Morphology ATYPICAL LYMPHOCYTES   I-Stat Chem 8, ED  Result Value Ref Range    Sodium 138 135 - 145 mmol/L   Potassium 3.5 3.5 - 5.1 mmol/L   Chloride 103 96 - 112 mmol/L   BUN 18 6 - 23 mg/dL   Creatinine, Ser 1.10 0.50 - 1.10 mg/dL   Glucose, Bld 121 (H) 70 - 99 mg/dL   Calcium, Ion 1.17 1.13 - 1.30 mmol/L   TCO2 19 0 - 100 mmol/L   Hemoglobin 12.6 12.0 - 15.0 g/dL   HCT 37.0 36.0 - 46.0 %   Ct Angio Head W/cm &/or Wo Cm  10/29/2014   CLINICAL DATA:  Initial evaluation for acute onset headache.  EXAM: CT ANGIOGRAPHY HEAD AND NECK  TECHNIQUE: Multidetector CT imaging of the head and neck was performed using the standard protocol during bolus administration of intravenous contrast. Multiplanar CT image reconstructions and MIPs were obtained to evaluate the vascular anatomy. Carotid stenosis measurements (when applicable) are obtained utilizing NASCET criteria, using the distal internal carotid diameter as the denominator.  CONTRAST:  8mL OMNIPAQUE IOHEXOL 350 MG/ML SOLN  COMPARISON:  None.  FINDINGS: CT HEAD  Brain: Mild diffuse prominence of the CSF containing spaces is compatible with generalized cerebral atrophy. Patchy and confluent hypodensity within the periventricular and deep white matter both cerebral hemispheres most consistent  with chronic small vessel ischemic disease.  No acute large vessel territory infarct.  Calvarium and skull base: Calvarium intact. Scalp soft tissues within normal limits.  Paranasal sinuses: Mild opacity present within the bilateral maxillary sinuses. Paranasal on this is are otherwise clear. No mastoid effusion.  Orbits: No acute abnormality about the orbits.  CTA NECK  Aortic arch: Aortic arch within normal limits for caliber. Minimal atherosclerotic plaque present within the aortic arch itself. No high-grade stenosis at the origin of the great vessels. There is calcified and noncalcified atheromatous disease within the proximal left subclavian artery without significant stenosis. Left subclavian artery otherwise well opacified. Visualize right  subclavian artery widely patent.  Right carotid system: Proximal right common carotid artery tortuous but widely patent. The right common carotid artery is well opacified to the carotid bifurcation. Right internal carotid artery well opacified to the skullbase.  Left carotid system: Left common carotid artery well opacified from its origin at the aortic arch to the carotid bifurcation. Left internal carotid artery well opacified to the skullbase. No evidence for dissection, occlusion, or significant stenosis.  Vertebral arteries:Both vertebral arteries arise from the subclavian arteries there is apparent short segment high-grade stenosis at the origin of the left vertebral artery. Left vertebral artery otherwise well opacified to the skullbase without significant stenosis or dissection.  Right vertebral artery Roux well opacified along its entire course without evidence for dissection or high-grade stenosis.  Skeleton: Moderate multilevel degenerative disc disease is evidenced by intervertebral disc space narrowing, endplate sclerosis, and osteophytosis present within the visualized cervical spine, most severe at C4-5, C5-6, and C6-7. No acute osseous abnormality. No worrisome lytic or blastic osseous lesions.  Other neck: No acute soft tissue abnormality within the neck. No adenopathy. Thyroid gland normal. Visualized superior mediastinum within normal limits. Scattered linear and ground-glass opacities within the partially visualized lungs likely reflect atelectatic changes.  CTA HEAD  Anterior circulation: The petrous segments of the internal carotid arteries widely patent bilaterally. Mild multi focal atheromatous irregularity present within the cavernous segments bilaterally, right greater than left. No focal high-grade stenosis. Supraclinoid segments widely patent. A1 segments, anterior communicating artery, and anterior cerebral arteries well opacified.  M1 segments widely patent bilaterally without stenosis  or occlusion. MCA bifurcations normal. Distal MCA branches well opacified.  Posterior circulation: Left vertebral artery is dominant and widely patent to the vertebrobasilar junction. Mildly diminutive right vertebral artery also widely patent. Posterior inferior cerebral arteries patent. Basilar artery mildly tortuous but widely patent. Superior cerebellar arteries well opacified bilaterally. Posterior cerebral arteries well opacified bilaterally. No proximal branch occlusion or significant stenosis within the posterior circulation.  Venous sinuses: No acute abnormality within the venous sinuses.  Anatomic variants: No anatomic variant. No aneurysm or vascular malformation.  Delayed phase: No abnormal enhancement on delayed sequence.  IMPRESSION: 1. No acute intracranial process identified. 2. No acute abnormality identified within the major arterial vasculature of the head and neck. No evidence for dissection, hemodynamically significant stenosis, or vascular occlusion. 3. Mild atrophy with chronic microvascular ischemic disease.   Electronically Signed   By: Jeannine Boga M.D.   On: 10/29/2014 02:02   Ct Angio Neck W/cm &/or Wo/cm  10/29/2014   CLINICAL DATA:  Initial evaluation for acute onset headache.  EXAM: CT ANGIOGRAPHY HEAD AND NECK  TECHNIQUE: Multidetector CT imaging of the head and neck was performed using the standard protocol during bolus administration of intravenous contrast. Multiplanar CT image reconstructions and MIPs were obtained to evaluate the vascular  anatomy. Carotid stenosis measurements (when applicable) are obtained utilizing NASCET criteria, using the distal internal carotid diameter as the denominator.  CONTRAST:  20mL OMNIPAQUE IOHEXOL 350 MG/ML SOLN  COMPARISON:  None.  FINDINGS: CT HEAD  Brain: Mild diffuse prominence of the CSF containing spaces is compatible with generalized cerebral atrophy. Patchy and confluent hypodensity within the periventricular and deep white  matter both cerebral hemispheres most consistent with chronic small vessel ischemic disease.  No acute large vessel territory infarct.  Calvarium and skull base: Calvarium intact. Scalp soft tissues within normal limits.  Paranasal sinuses: Mild opacity present within the bilateral maxillary sinuses. Paranasal on this is are otherwise clear. No mastoid effusion.  Orbits: No acute abnormality about the orbits.  CTA NECK  Aortic arch: Aortic arch within normal limits for caliber. Minimal atherosclerotic plaque present within the aortic arch itself. No high-grade stenosis at the origin of the great vessels. There is calcified and noncalcified atheromatous disease within the proximal left subclavian artery without significant stenosis. Left subclavian artery otherwise well opacified. Visualize right subclavian artery widely patent.  Right carotid system: Proximal right common carotid artery tortuous but widely patent. The right common carotid artery is well opacified to the carotid bifurcation. Right internal carotid artery well opacified to the skullbase.  Left carotid system: Left common carotid artery well opacified from its origin at the aortic arch to the carotid bifurcation. Left internal carotid artery well opacified to the skullbase. No evidence for dissection, occlusion, or significant stenosis.  Vertebral arteries:Both vertebral arteries arise from the subclavian arteries there is apparent short segment high-grade stenosis at the origin of the left vertebral artery. Left vertebral artery otherwise well opacified to the skullbase without significant stenosis or dissection.  Right vertebral artery Roux well opacified along its entire course without evidence for dissection or high-grade stenosis.  Skeleton: Moderate multilevel degenerative disc disease is evidenced by intervertebral disc space narrowing, endplate sclerosis, and osteophytosis present within the visualized cervical spine, most severe at C4-5, C5-6,  and C6-7. No acute osseous abnormality. No worrisome lytic or blastic osseous lesions.  Other neck: No acute soft tissue abnormality within the neck. No adenopathy. Thyroid gland normal. Visualized superior mediastinum within normal limits. Scattered linear and ground-glass opacities within the partially visualized lungs likely reflect atelectatic changes.  CTA HEAD  Anterior circulation: The petrous segments of the internal carotid arteries widely patent bilaterally. Mild multi focal atheromatous irregularity present within the cavernous segments bilaterally, right greater than left. No focal high-grade stenosis. Supraclinoid segments widely patent. A1 segments, anterior communicating artery, and anterior cerebral arteries well opacified.  M1 segments widely patent bilaterally without stenosis or occlusion. MCA bifurcations normal. Distal MCA branches well opacified.  Posterior circulation: Left vertebral artery is dominant and widely patent to the vertebrobasilar junction. Mildly diminutive right vertebral artery also widely patent. Posterior inferior cerebral arteries patent. Basilar artery mildly tortuous but widely patent. Superior cerebellar arteries well opacified bilaterally. Posterior cerebral arteries well opacified bilaterally. No proximal branch occlusion or significant stenosis within the posterior circulation.  Venous sinuses: No acute abnormality within the venous sinuses.  Anatomic variants: No anatomic variant. No aneurysm or vascular malformation.  Delayed phase: No abnormal enhancement on delayed sequence.  IMPRESSION: 1. No acute intracranial process identified. 2. No acute abnormality identified within the major arterial vasculature of the head and neck. No evidence for dissection, hemodynamically significant stenosis, or vascular occlusion. 3. Mild atrophy with chronic microvascular ischemic disease.   Electronically Signed   By: Marland Kitchen  Jeannine Boga M.D.   On: 10/29/2014 02:02    Antonietta Breach, PA-C 10/29/14 [place], PA-C 10/29/14 0254  Linton Flemings, MD 10/30/14 [phone]

## 2014-10-29 NOTE — Discharge Instructions (Signed)
General Headache Without Cause A headache is pain or discomfort felt around the head or neck area. The specific cause of a headache may not be found. There are many causes and types of headaches. A few common ones are:  Tension headaches.  Migraine headaches.  Cluster headaches.  Chronic daily headaches. HOME CARE INSTRUCTIONS   Keep all follow-up appointments with your caregiver or any specialist referral.  Only take over-the-counter or prescription medicines for pain or discomfort as directed by your caregiver.  Lie down in a dark, quiet room when you have a headache.  Keep a headache journal to find out what may trigger your migraine headaches. For example, write down:  What you eat and drink.  How much sleep you get.  Any change to your diet or medicines.  Try massage or other relaxation techniques.  Put ice packs or heat on the head and neck. Use these 3 to 4 times per day for 15 to 20 minutes each time, or as needed.  Limit stress.  Sit up straight, and do not tense your muscles.  Quit smoking if you smoke.  Limit alcohol use.  Decrease the amount of caffeine you drink, or stop drinking caffeine.  Eat and sleep on a regular schedule.  Get 7 to 9 hours of sleep, or as recommended by your caregiver.  Keep lights dim if bright lights bother you and make your headaches worse. SEEK MEDICAL CARE IF:   You have problems with the medicines you were prescribed.  Your medicines are not working.  You have a change from the usual headache.  You have nausea or vomiting. SEEK IMMEDIATE MEDICAL CARE IF:   Your headache becomes severe.  You have a fever.  You have a stiff neck.  You have loss of vision.  You have muscular weakness or loss of muscle control.  You start losing your balance or have trouble walking.  You feel faint or pass out.  You have severe symptoms that are different from your first symptoms. MAKE SURE YOU:   Understand these  instructions.  Will watch your condition.  Will get help right away if you are not doing well or get worse. Document Released: 07/25/2005 Document Revised: 10/17/2011 Document Reviewed: 08/10/2011 [facility] Patient Information 2015 [place], [place]. This information is not intended to replace advice given to you by your health care provider. Make sure you discuss any questions you have with your health care provider.  Otalgia The most common reason for this in children is an infection of the middle ear. Pain from the middle ear is usually caused by a build-up of fluid and pressure behind the eardrum. Pain from an earache can be sharp, dull, or burning. The pain may be temporary or constant. The middle ear is connected to the nasal passages by a short narrow tube called the Eustachian tube. The Eustachian tube allows fluid to drain out of the middle ear, and helps keep the pressure in your ear equalized. CAUSES  A cold or allergy can block the Eustachian tube with inflammation and the build-up of secretions. This is especially likely in small children, because their Eustachian tube is shorter and more horizontal. When the Eustachian tube closes, the normal flow of fluid from the middle ear is stopped. Fluid can accumulate and cause stuffiness, pain, hearing loss, and an ear infection if germs start growing in this area. SYMPTOMS  The symptoms of an ear infection may include fever, ear pain, fussiness, increased crying, and irritability. Many children  will have temporary and minor hearing loss during and right after an ear infection. Permanent hearing loss is rare, but the risk increases the more infections a child has. Other causes of ear pain include retained water in the outer ear canal from swimming and bathing. Ear pain in adults is less likely to be from an ear infection. Ear pain may be referred from other locations. Referred pain may be from the joint between your jaw and the skull. It may also  come from a tooth problem or problems in the neck. Other causes of ear pain include:  A foreign body in the ear.  Outer ear infection.  Sinus infections.  Impacted ear wax.  Ear injury.  Arthritis of the jaw or TMJ problems.  Middle ear infection.  Tooth infections.  Sore throat with pain to the ears. DIAGNOSIS  Your caregiver can usually make the diagnosis by examining you. Sometimes other special studies, including x-rays and lab work may be necessary. TREATMENT   If antibiotics were prescribed, use them as directed and finish them even if you or your child's symptoms seem to be improved.  Sometimes PE tubes are needed in children. These are little plastic tubes which are put into the eardrum during a simple surgical procedure. They allow fluid to drain easier and allow the pressure in the middle ear to equalize. This helps relieve the ear pain caused by pressure changes. HOME CARE INSTRUCTIONS   Only take over-the-counter or prescription medicines for pain, discomfort, or fever as directed by your caregiver. DO NOT GIVE CHILDREN ASPIRIN because of the association of Reye's Syndrome in children taking aspirin.  Use a cold pack applied to the outer ear for 15-20 minutes, 03-04 times per day or as needed may reduce pain. Do not apply ice directly to the skin. You may cause frost bite.  Over-the-counter ear drops used as directed may be effective. Your caregiver may sometimes prescribe ear drops.  Resting in an upright position may help reduce pressure in the middle ear and relieve pain.  Ear pain caused by rapidly descending from high altitudes can be relieved by swallowing or chewing gum. Allowing infants to suck on a bottle during airplane travel can help.  Do not smoke in the house or near children. If you are unable to quit smoking, smoke outside.  Control allergies. SEEK IMMEDIATE MEDICAL CARE IF:   You or your child are becoming sicker.  Pain or fever relief is not  obtained with medicine.  You or your child's symptoms (pain, fever, or irritability) do not improve within 24 to 48 hours or as instructed.  Severe pain suddenly stops hurting. This may indicate a ruptured eardrum.  You or your children develop new problems such as severe headaches, stiff neck, difficulty swallowing, or swelling of the face or around the ear. Document Released: 03/11/2004 Document Revised: 10/17/2011 Document Reviewed: 07/16/2008 [facility] Patient Information 2015 [place], [place]. This information is not intended to replace advice given to you by your health care provider. Make sure you discuss any questions you have with your health care provider.

## 2014-10-29 NOTE — ED Notes (Signed)
Pt verbalized understanding of d/c instructions and has no further questions. Pt's son driving pt home.

## 2014-12-11 ENCOUNTER — Other Ambulatory Visit: Payer: Self-pay | Admitting: Internal Medicine

## 2014-12-11 DIAGNOSIS — R51 Headache: Secondary | ICD-10-CM

## 2014-12-11 DIAGNOSIS — R519 Headache, unspecified: Secondary | ICD-10-CM

## 2014-12-11 DIAGNOSIS — I959 Hypotension, unspecified: Secondary | ICD-10-CM

## 2015-01-02 ENCOUNTER — Ambulatory Visit
Admission: RE | Admit: 2015-01-02 | Discharge: 2015-01-02 | Disposition: A | Discharge status: Home / Self Care | Payer: 59 | Source: Ambulatory Visit | Attending: Internal Medicine | Admitting: Internal Medicine

## 2015-01-02 DIAGNOSIS — R51 Headache: Secondary | ICD-10-CM

## 2015-01-02 DIAGNOSIS — I959 Hypotension, unspecified: Secondary | ICD-10-CM

## 2015-01-02 DIAGNOSIS — R519 Headache, unspecified: Secondary | ICD-10-CM

## 2015-01-02 MED ORDER — GADOBENATE DIMEGLUMINE 529 MG/ML IV SOLN
10.0000 mL | Freq: Once | INTRAVENOUS | Status: AC | PRN
  Administered 2015-01-02: 10 mL via INTRAVENOUS

## 2015-01-19 ENCOUNTER — Other Ambulatory Visit: Payer: Self-pay

## 2015-01-19 DIAGNOSIS — Z1231 Encounter for screening mammogram for malignant neoplasm of breast: Secondary | ICD-10-CM

## 2015-02-16 ENCOUNTER — Ambulatory Visit: Payer: 59

## 2015-04-08 ENCOUNTER — Other Ambulatory Visit: Payer: Self-pay | Admitting: Orthopedic Surgery

## 2015-04-08 DIAGNOSIS — M25512 Pain in left shoulder: Secondary | ICD-10-CM

## 2015-04-09 ENCOUNTER — Other Ambulatory Visit: Payer: Self-pay | Admitting: Orthopaedic Surgery

## 2015-04-09 DIAGNOSIS — M4716 Other spondylosis with myelopathy, lumbar region: Secondary | ICD-10-CM

## 2015-04-17 ENCOUNTER — Ambulatory Visit: Payer: 59

## 2015-04-22 ENCOUNTER — Other Ambulatory Visit: Payer: 59

## 2015-04-24 ENCOUNTER — Other Ambulatory Visit: Payer: 59

## 2015-04-26 ENCOUNTER — Ambulatory Visit
Admission: RE | Admit: 2015-04-26 | Discharge: 2015-04-26 | Disposition: A | Discharge status: Home / Self Care | Payer: 59 | Source: Ambulatory Visit | Attending: Orthopaedic Surgery | Admitting: Orthopaedic Surgery

## 2015-04-26 ENCOUNTER — Ambulatory Visit
Admission: RE | Admit: 2015-04-26 | Discharge: 2015-04-26 | Disposition: A | Discharge status: Home / Self Care | Payer: 59 | Source: Ambulatory Visit | Attending: Orthopedic Surgery | Admitting: Orthopedic Surgery

## 2015-04-26 DIAGNOSIS — M25512 Pain in left shoulder: Secondary | ICD-10-CM

## 2015-04-26 DIAGNOSIS — M4716 Other spondylosis with myelopathy, lumbar region: Secondary | ICD-10-CM

## 2015-05-07 ENCOUNTER — Other Ambulatory Visit: Payer: 59

## 2015-05-25 ENCOUNTER — Other Ambulatory Visit: Payer: Self-pay | Admitting: Orthopaedic Surgery

## 2015-05-25 DIAGNOSIS — M25551 Pain in right hip: Secondary | ICD-10-CM

## 2015-06-09 ENCOUNTER — Ambulatory Visit
Admission: RE | Admit: 2015-06-09 | Discharge: 2015-06-09 | Disposition: A | Discharge status: Home / Self Care | Payer: Medicare Other | Source: Ambulatory Visit | Attending: Orthopaedic Surgery | Admitting: Orthopaedic Surgery

## 2015-06-09 DIAGNOSIS — M25551 Pain in right hip: Secondary | ICD-10-CM

## 2015-06-15 DIAGNOSIS — Z23 Encounter for immunization: Secondary | ICD-10-CM | POA: Diagnosis not present

## 2015-06-15 DIAGNOSIS — G4700 Insomnia, unspecified: Secondary | ICD-10-CM | POA: Diagnosis not present

## 2015-06-15 DIAGNOSIS — I10 Essential (primary) hypertension: Secondary | ICD-10-CM | POA: Diagnosis not present

## 2015-06-15 DIAGNOSIS — F324 Major depressive disorder, single episode, in partial remission: Secondary | ICD-10-CM | POA: Diagnosis not present

## 2015-06-15 DIAGNOSIS — K149 Disease of tongue, unspecified: Secondary | ICD-10-CM | POA: Diagnosis not present

## 2015-06-16 ENCOUNTER — Ambulatory Visit
Admission: RE | Admit: 2015-06-16 | Discharge: 2015-06-16 | Disposition: A | Discharge status: Home / Self Care | Payer: Medicare Other | Source: Ambulatory Visit

## 2015-06-16 DIAGNOSIS — Z1231 Encounter for screening mammogram for malignant neoplasm of breast: Secondary | ICD-10-CM

## 2015-06-16 DIAGNOSIS — E039 Hypothyroidism, unspecified: Secondary | ICD-10-CM | POA: Diagnosis not present

## 2015-06-16 DIAGNOSIS — E1165 Type 2 diabetes mellitus with hyperglycemia: Secondary | ICD-10-CM | POA: Diagnosis not present

## 2015-06-16 DIAGNOSIS — Z794 Long term (current) use of insulin: Secondary | ICD-10-CM | POA: Diagnosis not present

## 2015-06-18 DIAGNOSIS — M25551 Pain in right hip: Secondary | ICD-10-CM | POA: Diagnosis not present

## 2015-06-18 DIAGNOSIS — M4726 Other spondylosis with radiculopathy, lumbar region: Secondary | ICD-10-CM | POA: Diagnosis not present

## 2015-07-01 DIAGNOSIS — J209 Acute bronchitis, unspecified: Secondary | ICD-10-CM | POA: Diagnosis not present

## 2015-07-01 DIAGNOSIS — B349 Viral infection, unspecified: Secondary | ICD-10-CM | POA: Diagnosis not present

## 2015-07-08 DIAGNOSIS — M4726 Other spondylosis with radiculopathy, lumbar region: Secondary | ICD-10-CM | POA: Diagnosis not present

## 2015-07-08 DIAGNOSIS — M4716 Other spondylosis with myelopathy, lumbar region: Secondary | ICD-10-CM | POA: Diagnosis not present

## 2015-07-08 DIAGNOSIS — M5416 Radiculopathy, lumbar region: Secondary | ICD-10-CM | POA: Diagnosis not present

## 2015-07-08 DIAGNOSIS — M545 Low back pain: Secondary | ICD-10-CM | POA: Diagnosis not present

## 2015-07-19 DIAGNOSIS — I10 Essential (primary) hypertension: Secondary | ICD-10-CM | POA: Diagnosis not present

## 2015-07-19 DIAGNOSIS — J0110 Acute frontal sinusitis, unspecified: Secondary | ICD-10-CM | POA: Diagnosis not present

## 2015-07-22 DIAGNOSIS — E113313 Type 2 diabetes mellitus with moderate nonproliferative diabetic retinopathy with macular edema, bilateral: Secondary | ICD-10-CM | POA: Diagnosis not present

## 2015-08-12 DIAGNOSIS — M7542 Impingement syndrome of left shoulder: Secondary | ICD-10-CM | POA: Diagnosis not present

## 2015-08-12 DIAGNOSIS — M94212 Chondromalacia, left shoulder: Secondary | ICD-10-CM | POA: Diagnosis not present

## 2015-08-12 DIAGNOSIS — M19012 Primary osteoarthritis, left shoulder: Secondary | ICD-10-CM | POA: Diagnosis not present

## 2015-08-12 DIAGNOSIS — G8918 Other acute postprocedural pain: Secondary | ICD-10-CM | POA: Diagnosis not present

## 2015-08-12 DIAGNOSIS — S43492A Other sprain of left shoulder joint, initial encounter: Secondary | ICD-10-CM | POA: Diagnosis not present

## 2015-08-12 DIAGNOSIS — M24112 Other articular cartilage disorders, left shoulder: Secondary | ICD-10-CM | POA: Diagnosis not present

## 2015-08-18 DIAGNOSIS — Z9889 Other specified postprocedural states: Secondary | ICD-10-CM | POA: Diagnosis not present

## 2015-08-18 DIAGNOSIS — M25612 Stiffness of left shoulder, not elsewhere classified: Secondary | ICD-10-CM | POA: Diagnosis not present

## 2015-08-26 DIAGNOSIS — M6281 Muscle weakness (generalized): Secondary | ICD-10-CM | POA: Diagnosis not present

## 2015-08-26 DIAGNOSIS — Z9889 Other specified postprocedural states: Secondary | ICD-10-CM | POA: Diagnosis not present

## 2015-08-26 DIAGNOSIS — M25612 Stiffness of left shoulder, not elsewhere classified: Secondary | ICD-10-CM | POA: Diagnosis not present

## 2015-09-02 DIAGNOSIS — M25612 Stiffness of left shoulder, not elsewhere classified: Secondary | ICD-10-CM | POA: Diagnosis not present

## 2015-09-02 DIAGNOSIS — M6281 Muscle weakness (generalized): Secondary | ICD-10-CM | POA: Diagnosis not present

## 2015-09-02 DIAGNOSIS — Z9889 Other specified postprocedural states: Secondary | ICD-10-CM | POA: Diagnosis not present

## 2015-09-15 DIAGNOSIS — H18411 Arcus senilis, right eye: Secondary | ICD-10-CM | POA: Diagnosis not present

## 2015-09-15 DIAGNOSIS — H18412 Arcus senilis, left eye: Secondary | ICD-10-CM | POA: Diagnosis not present

## 2015-09-15 DIAGNOSIS — H02839 Dermatochalasis of unspecified eye, unspecified eyelid: Secondary | ICD-10-CM | POA: Diagnosis not present

## 2015-09-15 DIAGNOSIS — H2511 Age-related nuclear cataract, right eye: Secondary | ICD-10-CM | POA: Diagnosis not present

## 2015-09-15 DIAGNOSIS — E113319 Type 2 diabetes mellitus with moderate nonproliferative diabetic retinopathy with macular edema, unspecified eye: Secondary | ICD-10-CM | POA: Diagnosis not present

## 2015-09-15 DIAGNOSIS — H2512 Age-related nuclear cataract, left eye: Secondary | ICD-10-CM | POA: Diagnosis not present

## 2015-10-12 DIAGNOSIS — H2512 Age-related nuclear cataract, left eye: Secondary | ICD-10-CM | POA: Diagnosis not present

## 2015-10-12 DIAGNOSIS — H25812 Combined forms of age-related cataract, left eye: Secondary | ICD-10-CM | POA: Diagnosis not present

## 2015-10-12 DIAGNOSIS — H2513 Age-related nuclear cataract, bilateral: Secondary | ICD-10-CM | POA: Diagnosis not present

## 2015-10-13 DIAGNOSIS — H25041 Posterior subcapsular polar age-related cataract, right eye: Secondary | ICD-10-CM | POA: Diagnosis not present

## 2015-10-13 DIAGNOSIS — H25011 Cortical age-related cataract, right eye: Secondary | ICD-10-CM | POA: Diagnosis not present

## 2015-10-13 DIAGNOSIS — H2511 Age-related nuclear cataract, right eye: Secondary | ICD-10-CM | POA: Diagnosis not present

## 2015-10-22 DIAGNOSIS — G4700 Insomnia, unspecified: Secondary | ICD-10-CM | POA: Diagnosis not present

## 2015-10-22 DIAGNOSIS — I10 Essential (primary) hypertension: Secondary | ICD-10-CM | POA: Diagnosis not present

## 2015-10-22 DIAGNOSIS — L989 Disorder of the skin and subcutaneous tissue, unspecified: Secondary | ICD-10-CM | POA: Diagnosis not present

## 2015-10-22 DIAGNOSIS — R238 Other skin changes: Secondary | ICD-10-CM | POA: Diagnosis not present

## 2015-10-22 DIAGNOSIS — F324 Major depressive disorder, single episode, in partial remission: Secondary | ICD-10-CM | POA: Diagnosis not present

## 2015-11-06 DIAGNOSIS — H2511 Age-related nuclear cataract, right eye: Secondary | ICD-10-CM | POA: Diagnosis not present

## 2015-11-06 DIAGNOSIS — H2513 Age-related nuclear cataract, bilateral: Secondary | ICD-10-CM | POA: Diagnosis not present

## 2015-11-06 DIAGNOSIS — H25811 Combined forms of age-related cataract, right eye: Secondary | ICD-10-CM | POA: Diagnosis not present

## 2015-11-18 DIAGNOSIS — H40053 Ocular hypertension, bilateral: Secondary | ICD-10-CM | POA: Diagnosis not present

## 2015-12-11 ENCOUNTER — Encounter (HOSPITAL_COMMUNITY): Payer: Self-pay

## 2015-12-11 ENCOUNTER — Emergency Department (HOSPITAL_COMMUNITY)
Admission: EM | Admit: 2015-12-11 | Discharge: 2015-12-11 | Disposition: A | Discharge status: Home / Self Care | Payer: Medicare Other | Attending: Emergency Medicine | Admitting: Emergency Medicine

## 2015-12-11 DIAGNOSIS — Z7951 Long term (current) use of inhaled steroids: Secondary | ICD-10-CM | POA: Insufficient documentation

## 2015-12-11 DIAGNOSIS — Z86018 Personal history of other benign neoplasm: Secondary | ICD-10-CM | POA: Diagnosis not present

## 2015-12-11 DIAGNOSIS — Z8781 Personal history of (healed) traumatic fracture: Secondary | ICD-10-CM | POA: Insufficient documentation

## 2015-12-11 DIAGNOSIS — R1110 Vomiting, unspecified: Secondary | ICD-10-CM | POA: Insufficient documentation

## 2015-12-11 DIAGNOSIS — I10 Essential (primary) hypertension: Secondary | ICD-10-CM | POA: Insufficient documentation

## 2015-12-11 DIAGNOSIS — Z9889 Other specified postprocedural states: Secondary | ICD-10-CM | POA: Diagnosis not present

## 2015-12-11 DIAGNOSIS — E119 Type 2 diabetes mellitus without complications: Secondary | ICD-10-CM | POA: Insufficient documentation

## 2015-12-11 DIAGNOSIS — E785 Hyperlipidemia, unspecified: Secondary | ICD-10-CM | POA: Diagnosis not present

## 2015-12-11 DIAGNOSIS — Z7982 Long term (current) use of aspirin: Secondary | ICD-10-CM | POA: Diagnosis not present

## 2015-12-11 DIAGNOSIS — Z8742 Personal history of other diseases of the female genital tract: Secondary | ICD-10-CM | POA: Insufficient documentation

## 2015-12-11 DIAGNOSIS — E039 Hypothyroidism, unspecified: Secondary | ICD-10-CM | POA: Diagnosis not present

## 2015-12-11 DIAGNOSIS — Z862 Personal history of diseases of the blood and blood-forming organs and certain disorders involving the immune mechanism: Secondary | ICD-10-CM | POA: Diagnosis not present

## 2015-12-11 DIAGNOSIS — Z86718 Personal history of other venous thrombosis and embolism: Secondary | ICD-10-CM | POA: Insufficient documentation

## 2015-12-11 DIAGNOSIS — Z880 Allergy status to penicillin: Secondary | ICD-10-CM | POA: Diagnosis not present

## 2015-12-11 DIAGNOSIS — M159 Polyosteoarthritis, unspecified: Secondary | ICD-10-CM | POA: Insufficient documentation

## 2015-12-11 DIAGNOSIS — G2581 Restless legs syndrome: Secondary | ICD-10-CM | POA: Insufficient documentation

## 2015-12-11 DIAGNOSIS — R55 Syncope and collapse: Secondary | ICD-10-CM | POA: Diagnosis not present

## 2015-12-11 DIAGNOSIS — Z7984 Long term (current) use of oral hypoglycemic drugs: Secondary | ICD-10-CM | POA: Diagnosis not present

## 2015-12-11 DIAGNOSIS — K219 Gastro-esophageal reflux disease without esophagitis: Secondary | ICD-10-CM | POA: Insufficient documentation

## 2015-12-11 DIAGNOSIS — Z79899 Other long term (current) drug therapy: Secondary | ICD-10-CM | POA: Diagnosis not present

## 2015-12-11 DIAGNOSIS — E559 Vitamin D deficiency, unspecified: Secondary | ICD-10-CM | POA: Insufficient documentation

## 2015-12-11 DIAGNOSIS — Z791 Long term (current) use of non-steroidal anti-inflammatories (NSAID): Secondary | ICD-10-CM | POA: Insufficient documentation

## 2015-12-11 DIAGNOSIS — Z794 Long term (current) use of insulin: Secondary | ICD-10-CM | POA: Diagnosis not present

## 2015-12-11 DIAGNOSIS — F329 Major depressive disorder, single episode, unspecified: Secondary | ICD-10-CM | POA: Diagnosis not present

## 2015-12-11 DIAGNOSIS — N390 Urinary tract infection, site not specified: Secondary | ICD-10-CM | POA: Diagnosis not present

## 2015-12-11 DIAGNOSIS — R197 Diarrhea, unspecified: Secondary | ICD-10-CM | POA: Insufficient documentation

## 2015-12-11 DIAGNOSIS — R000 Tachycardia, unspecified: Secondary | ICD-10-CM | POA: Insufficient documentation

## 2015-12-11 DIAGNOSIS — R531 Weakness: Secondary | ICD-10-CM | POA: Diagnosis present

## 2015-12-11 DIAGNOSIS — Z87442 Personal history of urinary calculi: Secondary | ICD-10-CM | POA: Diagnosis not present

## 2015-12-11 DIAGNOSIS — G4700 Insomnia, unspecified: Secondary | ICD-10-CM | POA: Diagnosis not present

## 2015-12-11 LAB — CBC
HEMATOCRIT: 34.9 % — AB (ref 36.0–46.0)
Hemoglobin: 10.5 g/dL — ABNORMAL LOW (ref 12.0–15.0)
MCH: 19.6 pg — AB (ref 26.0–34.0)
MCHC: 30.1 g/dL (ref 30.0–36.0)
MCV: 65.2 fL — ABNORMAL LOW (ref 78.0–100.0)
Platelets: 315 10*3/uL (ref 150–400)
RBC: 5.35 MIL/uL — AB (ref 3.87–5.11)
RDW: 16.8 % — ABNORMAL HIGH (ref 11.5–15.5)
WBC: 8.2 10*3/uL (ref 4.0–10.5)

## 2015-12-11 LAB — BASIC METABOLIC PANEL
ANION GAP: 9 (ref 5–15)
BUN: 14 mg/dL (ref 6–20)
CALCIUM: 9.3 mg/dL (ref 8.9–10.3)
CO2: 23 mmol/L (ref 22–32)
Chloride: 106 mmol/L (ref 101–111)
Creatinine, Ser: 1.1 mg/dL — ABNORMAL HIGH (ref 0.44–1.00)
GFR calc non Af Amer: 52 mL/min — ABNORMAL LOW (ref >60)
GFR, EST AFRICAN AMERICAN: 60 mL/min — AB (ref >60)
GLUCOSE: 259 mg/dL — AB (ref 65–99)
POTASSIUM: 4.4 mmol/L (ref 3.5–5.1)
Sodium: 138 mmol/L (ref 135–145)

## 2015-12-11 LAB — URINALYSIS, ROUTINE W REFLEX MICROSCOPIC
BILIRUBIN URINE: NEGATIVE
Hgb urine dipstick: NEGATIVE
Ketones, ur: NEGATIVE mg/dL
NITRITE: NEGATIVE
PH: 5.5 (ref 5.0–8.0)
Protein, ur: NEGATIVE mg/dL
SPECIFIC GRAVITY, URINE: 1.023 (ref 1.005–1.030)

## 2015-12-11 LAB — URINE MICROSCOPIC-ADD ON

## 2015-12-11 LAB — CBG MONITORING, ED: Glucose-Capillary: 271 mg/dL — ABNORMAL HIGH (ref 65–99)

## 2015-12-11 MED ORDER — LORAZEPAM 2 MG/ML IJ SOLN
1.0000 mg | Freq: Once | INTRAMUSCULAR | Status: AC
  Administered 2015-12-11: 1 mg via INTRAVENOUS
  Filled 2015-12-11: qty 1

## 2015-12-11 MED ORDER — DIPHENHYDRAMINE HCL 50 MG/ML IJ SOLN
25.0000 mg | Freq: Once | INTRAMUSCULAR | Status: AC
  Administered 2015-12-11: 25 mg via INTRAVENOUS
  Filled 2015-12-11: qty 1

## 2015-12-11 MED ORDER — DIAZEPAM 2 MG PO TABS: 2.0000 mg | ORAL_TABLET | ORAL | Status: DC | PRN

## 2015-12-11 MED ORDER — CIPROFLOXACIN HCL 500 MG PO TABS: 500.0000 mg | ORAL_TABLET | Freq: Two times a day (BID) | ORAL | Status: DC

## 2015-12-11 MED ORDER — SODIUM CHLORIDE 0.9 % IV BOLUS (SEPSIS)
1000.0000 mL | Freq: Once | INTRAVENOUS | Status: AC
  Administered 2015-12-11: 1000 mL via INTRAVENOUS

## 2015-12-11 MED ORDER — SODIUM CHLORIDE 0.9 % IV SOLN
INTRAVENOUS | Status: DC
  Administered 2015-12-11: 18:00:00 via INTRAVENOUS

## 2015-12-11 NOTE — ED Notes (Signed)
Pt c/o near syncope, L mid back pain, nausea, diarrhea, generalized itching, and generalized body aches x 2 days.  Pain score 8/10. Pt reports taking Tylenol w/o relief.

## 2015-12-11 NOTE — ED Provider Notes (Signed)
CSN: [id number]     Arrival date & time 12/11/15  1358 History   First MD Initiated Contact with Patient 12/11/15 1601     Chief Complaint  Patient presents with  . Near Syncope  . Back Pain  . Emesis  . Diarrhea     (Consider location/radiation/quality/duration/timing/severity/associated sxs/prior Treatment) HPI Comments: Patient here complaining of several days of whole-body weakness without associated joint myalgias in some nausea and vomiting watery diarrhea. She also notes increased twitching in her legs. Does have a history of fibromyalgia as well as restless leg syndrome and this feels similar to that. States increased stress recently. Took promethazine at home with some relief. Denies any fever, abdominal pain. Her vomiting has been nonbilious. Does have a history of UTI and does note some left-sided flank pain and states that this is similar to when she was admitted for her UTI.  Patient is a 65 y.o. female presenting with near-syncope, back pain, vomiting, and diarrhea. The history is provided by the patient and a relative.  Near Syncope  Back Pain Emesis Associated symptoms: diarrhea   Diarrhea Associated symptoms: vomiting     Past Medical History  Diagnosis Date  . Diabetes mellitus   . Hypertension   . Hypothyroidism   . PONV (postoperative nausea and vomiting)   . Blood transfusion   . UTI (lower urinary tract infection)   . GERD (gastroesophageal reflux disease)     occ  . Fibromyalgia   . Arthritis   . Depression   . DVT (deep venous thrombosis) (HCC)     times 2 lower leg  . Hiatal hernia   . Diverticulitis   . Endometriosis   . Anemia     iron deficiency  . Osteoarthritis     back and knee  . Insomnia   . Hyperlipidemia   . RLS (restless legs syndrome)   . Uterine fibroid   . Nephrolithiasis   . Vitamin D deficiency disease   . Thoracic compression fracture ([place])   . Ovarian cyst   . DJD (degenerative joint disease)   . Diastolic dysfunction    . Mild aortic insufficiency     by echo 04/2013   Past Surgical History  Procedure Laterality Date  . Cholecystectomy    . Appendectomy    . Orthopedic surgeries      multiple  . Cesarean section      x 2  . Knee arthroplasty  09    lft partial  . Knee arthroplasty      rt  . Shoulder open rotator cuff repair      rt and lft  . Breast surgery      breast reduction  . Dorsal compartment release  06/05/2012    Procedure: RELEASE DORSAL COMPARTMENT (DEQUERVAIN);  Surgeon: Cammie Sickle., MD;  Location: [facility];  Service: Orthopedics;  Laterality: Left;  Excision of mixoid cyst also  . Carpal tunnel release  06/05/2012    Procedure: CARPAL TUNNEL RELEASE;  Surgeon: Cammie Sickle., MD;  Location: [place];  Service: Orthopedics;  Laterality: Left;  . Back surgery  ,2005    April  2013 - [facility]  . Tubal ligation      btsp  . Cardiac catheterization  04/2013    normal coronary arteries and normal LVF   Family History  Problem Relation Age of Onset  . Heart attack Father     20s  . Hypothyroidism Father   .  Diabetes type II Father   . Diabetes type II Mother   . Hypertension Mother   . Breast cancer Mother 11  . Hypothyroidism Brother    Social History  Substance Use Topics  . Smoking status: Never Smoker   . Smokeless tobacco: Never Used  . Alcohol Use: No   OB History    Gravida Para Term Preterm AB TAB SAB Ectopic Multiple Living   2 2 2       2      Review of Systems  Cardiovascular: Positive for near-syncope.  Gastrointestinal: Positive for vomiting and diarrhea.  Musculoskeletal: Positive for back pain.  All other systems reviewed and are negative.     Allergies  Ambien; Crestor; Lipitor; Morphine and related; Statins; Amitriptyline; Ceftin; Lovaza; Lyrica; Metformin and related; Codeine; Lunesta; and Penicillins  Home Medications   Prior to Admission medications   Medication Sig Start Date End  Date Taking? Authorizing Provider  amLODipine (NORVASC) 5 MG tablet Take 5 mg by mouth daily.   Yes Historical Provider, MD  aspirin 81 MG chewable tablet Chew 81 mg by mouth daily.   Yes Historical Provider, MD  Calcium Carbonate-Vitamin D (CALCIUM 600 + D PO) Take 2 tablets by mouth daily.    Yes Historical Provider, MD  DULoxetine (CYMBALTA) 60 MG capsule Take 60 mg by mouth daily.   Yes Historical Provider, MD  gabapentin (NEURONTIN) 300 MG capsule Take 900 mg by mouth at bedtime.   Yes Historical Provider, MD  insulin NPH-regular Human (NOVOLIN 70/30) (70-30) 100 UNIT/ML injection Inject 60 Units into the skin 2 (two) times daily with a meal.   Yes Historical Provider, MD  levothyroxine (SYNTHROID, LEVOTHROID) 150 MCG tablet Take 150 mcg by mouth daily before breakfast.   Yes Historical Provider, MD  meloxicam (MOBIC) 7.5 MG tablet Take 7.5 mg by mouth daily.   Yes Historical Provider, MD  Multiple Vitamin (MULITIVITAMIN WITH MINERALS) TABS Take 1 tablet by mouth daily.    Yes Historical Provider, MD  pantoprazole (PROTONIX) 40 MG tablet Take 40 mg by mouth 2 (two) times daily.   Yes Historical Provider, MD  promethazine (PHENERGAN) 25 MG tablet Take 25 mg by mouth every 6 (six) hours as needed for nausea or vomiting.    Yes Historical Provider, MD  Suvorexant (BELSOMRA) 20 MG TABS Take 20 mg by mouth at bedtime as needed (for sleep).   Yes Historical Provider, MD  valsartan (DIOVAN) 320 MG tablet Take 320 mg by mouth daily.   Yes Historical Provider, MD  acetaminophen (TYLENOL) 500 MG tablet Take 1,000 mg by mouth every 6 (six) hours as needed for pain. pain    Historical Provider, MD  colesevelam (WELCHOL) 625 MG tablet Take 1,250 mg by mouth 2 (two) times daily with a meal.     Historical Provider, MD  diphenhydrAMINE (BENADRYL) 25 MG tablet Take 25 mg by mouth every 6 (six) hours as needed for allergies.    Historical Provider, MD  empagliflozin (JARDIANCE) 10 MG TABS tablet Take 10 mg by  mouth daily.    Historical Provider, MD  FLUoxetine (PROZAC) 20 MG tablet Take 20 mg by mouth daily.    Historical Provider, MD  fluticasone (FLONASE) 50 MCG/ACT nasal spray Place 1 spray into both nostrils daily as needed for allergies or rhinitis.    Historical Provider, MD  HYDROcodone-acetaminophen (NORCO/VICODIN) 5-325 MG per tablet Take 1 tablet by mouth every 4 (four) hours as needed for moderate pain or severe pain. 10/29/14  Antonietta Breach, PA-C  insulin glargine (LANTUS) 100 UNIT/ML injection Inject 36 Units into the skin every morning.  06/28/12   Bonnielee Haff, MD  insulin lispro (HUMALOG) 100 UNIT/ML injection Take 10 u meal coverage plus SSI: If CBG < 70: Drink juice; CBG 70-120: 0 u; CBG 121-150: 3 u; CBG 151-200: 4 u; CBG 201-250: 7 u; CBG 251-300: 11 u;CBG 301-350: 15 u; CBG 351-400: 20 u; CBG > 400: call MD 06/25/12   Pattricia Boss, MD  metFORMIN (GLUCOPHAGE) 1000 MG tablet Take 1,000 mg by mouth 2 (two) times daily with a meal.    Historical Provider, MD  metoprolol (TOPROL-XL) 200 MG 24 hr tablet Take 200 mg by mouth daily.    Historical Provider, MD  metoprolol succinate (TOPROL-XL) 100 MG 24 hr tablet Take 100 mg by mouth daily. Take with or immediately following a meal.    Historical Provider, MD  nystatin-triamcinolone (MYCOLOG II) cream APPLY TO AFFECTED AREA TWICE A DAY FOR 5 DAYS Patient taking differently: APPLY TO AFFECTED AREA TWICE A DAY AS NEEDED FOR RASH 03/20/14   Regina Eck, CNM  temazepam (RESTORIL) 30 MG capsule Take 30 mg by mouth at bedtime as needed for sleep.     Historical Provider, MD  valsartan-hydrochlorothiazide (DIOVAN-HCT) 320-12.5 MG per tablet Take 1 tablet by mouth daily.    Historical Provider, MD  venlafaxine (EFFEXOR-XR) 37.5 MG 24 hr capsule Take 37.5 mg by mouth daily.    Historical Provider, MD  Vitamin D, Ergocalciferol, (DRISDOL) 50000 UNITS CAPS capsule TAKE ONE CAPSULE BY MOUTH EVERY OTHER WEEK Patient not taking: Reported on 10/28/2014  04/22/13   Regina Eck, CNM   BP 166/70 mmHg  Pulse 105  Temp(Src) 98.3 F (36.8 C) (Oral)  Resp 16  Ht 5\' 3"  (1.6 m)  Wt 90.719 kg  BMI 35.44 kg/m2  SpO2 98% Physical Exam  Constitutional: She is oriented to person, place, and time. She appears well-developed and well-nourished.  Non-toxic appearance. No distress.  HENT:  Head: Normocephalic and atraumatic.  Eyes: Conjunctivae, EOM and lids are normal. Pupils are equal, round, and reactive to light.  Neck: Normal range of motion. Neck supple. No tracheal deviation present. No thyroid mass present.  Cardiovascular: Regular rhythm and normal heart sounds.  Tachycardia present.  Exam reveals no gallop.   No murmur heard. Pulmonary/Chest: Effort normal and breath sounds normal. No stridor. No respiratory distress. She has no decreased breath sounds. She has no wheezes. She has no rhonchi. She has no rales.  Abdominal: Soft. Normal appearance and bowel sounds are normal. She exhibits no distension. There is no tenderness. There is no rigidity, no rebound, no guarding and no CVA tenderness.  Musculoskeletal: Normal range of motion. She exhibits no edema or tenderness.  Neurological: She is alert and oriented to person, place, and time. She has normal strength. No cranial nerve deficit or sensory deficit. GCS eye subscore is 4. GCS verbal subscore is 5. GCS motor subscore is 6.  Skin: Skin is warm and dry. No abrasion and no rash noted.  Psychiatric: Her speech is normal and behavior is normal. Her affect is blunt.  Nursing note and vitals reviewed.   ED Course  Procedures (including critical care time) Labs Review Labs Reviewed  CBC - Abnormal; Notable for the following:    RBC 5.35 (*)    Hemoglobin 10.5 (*)    HCT 34.9 (*)    MCV 65.2 (*)    MCH 19.6 (*)  RDW 16.8 (*)    All other components within normal limits  CBG MONITORING, ED - Abnormal; Notable for the following:    Glucose-Capillary 271 (*)    All other  components within normal limits  URINE CULTURE  BASIC METABOLIC PANEL  URINALYSIS, ROUTINE W REFLEX MICROSCOPIC (NOT AT [facility])  CBG MONITORING, ED    Imaging Review No results found. I have personally reviewed and evaluated these images and lab results as part of my medical decision-making.   EKG Interpretation   Date/Time:  Friday Dec 11 2015 14:53:39 EDT Ventricular Rate:  100 PR Interval:  175 QRS Duration: 82 QT Interval:  341 QTC Calculation: 440 R Axis:   -21 Text Interpretation:  Sinus tachycardia Borderline left axis deviation  Anterior infarct, old Baseline wander in lead(s) II III aVF Confirmed by  Schelly Chuba  MD, Mariam Helbert ([id number]) on 12/11/2015 4:02:06 PM      MDM   Final diagnoses:  None    Patient complains of having restless leg syndrome and was given Ativan for this as well as Benadryl. We'll treat her for a UTI and she will follow with her Dr.    Lacretia Leigh, MD 12/11/15 [phone]

## 2015-12-11 NOTE — Discharge Instructions (Signed)

## 2015-12-13 LAB — URINE CULTURE: Culture: 3000 — AB

## 2015-12-15 DIAGNOSIS — G8929 Other chronic pain: Secondary | ICD-10-CM | POA: Diagnosis not present

## 2015-12-15 DIAGNOSIS — M25511 Pain in right shoulder: Secondary | ICD-10-CM | POA: Diagnosis not present

## 2015-12-16 DIAGNOSIS — H40053 Ocular hypertension, bilateral: Secondary | ICD-10-CM | POA: Diagnosis not present

## 2015-12-17 ENCOUNTER — Other Ambulatory Visit: Payer: Self-pay | Admitting: Orthopedic Surgery

## 2015-12-17 DIAGNOSIS — M2530 Other instability, unspecified joint: Secondary | ICD-10-CM

## 2015-12-17 DIAGNOSIS — M25511 Pain in right shoulder: Secondary | ICD-10-CM

## 2015-12-17 DIAGNOSIS — R531 Weakness: Secondary | ICD-10-CM

## 2015-12-22 DIAGNOSIS — H40053 Ocular hypertension, bilateral: Secondary | ICD-10-CM | POA: Diagnosis not present

## 2015-12-28 ENCOUNTER — Ambulatory Visit
Admission: RE | Admit: 2015-12-28 | Discharge: 2015-12-28 | Disposition: A | Discharge status: Home / Self Care | Payer: Medicare Other | Source: Ambulatory Visit | Attending: Orthopedic Surgery | Admitting: Orthopedic Surgery

## 2015-12-28 DIAGNOSIS — M2530 Other instability, unspecified joint: Secondary | ICD-10-CM

## 2015-12-28 DIAGNOSIS — M25511 Pain in right shoulder: Secondary | ICD-10-CM

## 2015-12-28 DIAGNOSIS — R531 Weakness: Secondary | ICD-10-CM

## 2015-12-31 DIAGNOSIS — M7541 Impingement syndrome of right shoulder: Secondary | ICD-10-CM | POA: Diagnosis not present

## 2015-12-31 DIAGNOSIS — M7581 Other shoulder lesions, right shoulder: Secondary | ICD-10-CM | POA: Diagnosis not present

## 2016-01-06 DIAGNOSIS — M75111 Incomplete rotator cuff tear or rupture of right shoulder, not specified as traumatic: Secondary | ICD-10-CM | POA: Diagnosis not present

## 2016-01-06 DIAGNOSIS — M7541 Impingement syndrome of right shoulder: Secondary | ICD-10-CM | POA: Diagnosis not present

## 2016-01-06 DIAGNOSIS — G8918 Other acute postprocedural pain: Secondary | ICD-10-CM | POA: Diagnosis not present

## 2016-01-06 DIAGNOSIS — M24111 Other articular cartilage disorders, right shoulder: Secondary | ICD-10-CM | POA: Diagnosis not present

## 2016-01-06 DIAGNOSIS — M19011 Primary osteoarthritis, right shoulder: Secondary | ICD-10-CM | POA: Diagnosis not present

## 2016-01-06 DIAGNOSIS — M659 Synovitis and tenosynovitis, unspecified: Secondary | ICD-10-CM | POA: Diagnosis not present

## 2016-01-06 DIAGNOSIS — M7551 Bursitis of right shoulder: Secondary | ICD-10-CM | POA: Diagnosis not present

## 2016-01-12 DIAGNOSIS — Z9889 Other specified postprocedural states: Secondary | ICD-10-CM | POA: Diagnosis not present

## 2016-01-12 DIAGNOSIS — R29898 Other symptoms and signs involving the musculoskeletal system: Secondary | ICD-10-CM | POA: Diagnosis not present

## 2016-01-15 ENCOUNTER — Emergency Department (HOSPITAL_COMMUNITY): Payer: Medicare Other

## 2016-01-15 ENCOUNTER — Emergency Department (HOSPITAL_COMMUNITY)
Admission: EM | Admit: 2016-01-15 | Discharge: 2016-01-15 | Disposition: A | Discharge status: Home / Self Care | Payer: Medicare Other | Attending: Emergency Medicine | Admitting: Emergency Medicine

## 2016-01-15 ENCOUNTER — Encounter (HOSPITAL_COMMUNITY): Payer: Self-pay

## 2016-01-15 DIAGNOSIS — Z96653 Presence of artificial knee joint, bilateral: Secondary | ICD-10-CM | POA: Diagnosis not present

## 2016-01-15 DIAGNOSIS — E039 Hypothyroidism, unspecified: Secondary | ICD-10-CM | POA: Insufficient documentation

## 2016-01-15 DIAGNOSIS — D649 Anemia, unspecified: Secondary | ICD-10-CM | POA: Diagnosis not present

## 2016-01-15 DIAGNOSIS — R251 Tremor, unspecified: Secondary | ICD-10-CM | POA: Diagnosis not present

## 2016-01-15 DIAGNOSIS — E119 Type 2 diabetes mellitus without complications: Secondary | ICD-10-CM | POA: Diagnosis not present

## 2016-01-15 DIAGNOSIS — R079 Chest pain, unspecified: Secondary | ICD-10-CM

## 2016-01-15 DIAGNOSIS — R0789 Other chest pain: Secondary | ICD-10-CM | POA: Insufficient documentation

## 2016-01-15 DIAGNOSIS — Z7982 Long term (current) use of aspirin: Secondary | ICD-10-CM | POA: Diagnosis not present

## 2016-01-15 DIAGNOSIS — F329 Major depressive disorder, single episode, unspecified: Secondary | ICD-10-CM | POA: Insufficient documentation

## 2016-01-15 DIAGNOSIS — Z794 Long term (current) use of insulin: Secondary | ICD-10-CM | POA: Insufficient documentation

## 2016-01-15 DIAGNOSIS — M1990 Unspecified osteoarthritis, unspecified site: Secondary | ICD-10-CM | POA: Insufficient documentation

## 2016-01-15 DIAGNOSIS — E785 Hyperlipidemia, unspecified: Secondary | ICD-10-CM | POA: Diagnosis not present

## 2016-01-15 DIAGNOSIS — I10 Essential (primary) hypertension: Secondary | ICD-10-CM | POA: Diagnosis not present

## 2016-01-15 LAB — BASIC METABOLIC PANEL
Anion gap: 9 (ref 5–15)
BUN: 10 mg/dL (ref 6–20)
CALCIUM: 8.8 mg/dL — AB (ref 8.9–10.3)
CHLORIDE: 102 mmol/L (ref 101–111)
CO2: 22 mmol/L (ref 22–32)
CREATININE: 0.97 mg/dL (ref 0.44–1.00)
GFR, EST NON AFRICAN AMERICAN: 60 mL/min — AB (ref >60)
Glucose, Bld: 311 mg/dL — ABNORMAL HIGH (ref 65–99)
Potassium: 4 mmol/L (ref 3.5–5.1)
SODIUM: 133 mmol/L — AB (ref 135–145)

## 2016-01-15 LAB — URINE MICROSCOPIC-ADD ON

## 2016-01-15 LAB — CBC
HCT: 30.1 % — ABNORMAL LOW (ref 36.0–46.0)
Hemoglobin: 8.6 g/dL — ABNORMAL LOW (ref 12.0–15.0)
MCH: 18.7 pg — ABNORMAL LOW (ref 26.0–34.0)
MCHC: 28.6 g/dL — ABNORMAL LOW (ref 30.0–36.0)
MCV: 65.3 fL — ABNORMAL LOW (ref 78.0–100.0)
PLATELETS: 292 10*3/uL (ref 150–400)
RBC: 4.61 MIL/uL (ref 3.87–5.11)
RDW: 17.1 % — AB (ref 11.5–15.5)
WBC: 6.1 10*3/uL (ref 4.0–10.5)

## 2016-01-15 LAB — URINALYSIS, ROUTINE W REFLEX MICROSCOPIC
BILIRUBIN URINE: NEGATIVE
HGB URINE DIPSTICK: NEGATIVE
KETONES UR: NEGATIVE mg/dL
Nitrite: NEGATIVE
PROTEIN: NEGATIVE mg/dL
Specific Gravity, Urine: 1.019 (ref 1.005–1.030)
pH: 5.5 (ref 5.0–8.0)

## 2016-01-15 LAB — I-STAT TROPONIN, ED: TROPONIN I, POC: 0 ng/mL (ref 0.00–0.08)

## 2016-01-15 LAB — CBG MONITORING, ED
GLUCOSE-CAPILLARY: 295 mg/dL — AB (ref 65–99)
Glucose-Capillary: 259 mg/dL — ABNORMAL HIGH (ref 65–99)

## 2016-01-15 MED ORDER — PROMETHAZINE HCL 25 MG/ML IJ SOLN
12.5000 mg | Freq: Once | INTRAMUSCULAR | Status: AC
  Administered 2016-01-15: 12.5 mg via INTRAVENOUS
  Filled 2016-01-15: qty 1

## 2016-01-15 MED ORDER — SODIUM CHLORIDE 0.9 % IV SOLN
INTRAVENOUS | Status: DC
  Administered 2016-01-15: 19:00:00 via INTRAVENOUS

## 2016-01-15 MED ORDER — ACETAMINOPHEN 325 MG PO TABS
650.0000 mg | ORAL_TABLET | Freq: Once | ORAL | Status: AC
  Administered 2016-01-15: 650 mg via ORAL
  Filled 2016-01-15: qty 2

## 2016-01-15 MED ORDER — ONDANSETRON HCL 4 MG/2ML IJ SOLN
4.0000 mg | Freq: Once | INTRAMUSCULAR | Status: DC
Start: 2016-01-15 — End: 2016-01-15

## 2016-01-15 MED ORDER — SODIUM CHLORIDE 0.9 % IV BOLUS (SEPSIS)
500.0000 mL | Freq: Once | INTRAVENOUS | Status: AC
  Administered 2016-01-15: 500 mL via INTRAVENOUS

## 2016-01-15 MED ORDER — GI COCKTAIL ~~LOC~~
30.0000 mL | Freq: Once | ORAL | Status: AC
  Administered 2016-01-15: 30 mL via ORAL
  Filled 2016-01-15: qty 30

## 2016-01-15 NOTE — ED Provider Notes (Addendum)
CSN: [id number]     Arrival date & time 01/15/16  1658 History   First MD Initiated Contact with Patient 01/15/16 1700     Chief Complaint  Patient presents with  . Chest Pain     (Consider location/radiation/quality/duration/timing/severity/associated sxs/prior Treatment) HPI    [name] is a 65 y.o. female who presents for evaluation of chest pain followed by generalized weakness, while she was cutting vegetables standing at the sink today. The pain started around 3:30 PM and has largely resolved at this time. For the pain, she took an extra baby aspirin and 2 Tums.she was transported by EMS gave her 4 more baby aspirin. She reports having  Increased stress recently, with her mother who has dementia, and  Taking care of 3 grandchildren today.  She had a cardiac catheterization which was reported to her as normal 3 years ago. She does not have ongoing chest pain.  No recent  Anorexia or change in bowel or urinary habits. She is treated for UTI about 3 weeks ago. Her urine has not been checked since that time. She denies dysuria, urinary frequency, diarrhea or constipation. At this time, she has mild nausea and requests Phenergan, for it. She takes  Protonix daily, she does not have ongoing heartburn symptoms. There are no other known modifying factors.   Past Medical History  Diagnosis Date  . Diabetes mellitus   . Hypertension   . Hypothyroidism   . PONV (postoperative nausea and vomiting)   . Blood transfusion   . UTI (lower urinary tract infection)   . GERD (gastroesophageal reflux disease)     occ  . Fibromyalgia   . Arthritis   . Depression   . DVT (deep venous thrombosis) (HCC)     times 2 lower leg  . Hiatal hernia   . Diverticulitis   . Endometriosis   . Anemia     iron deficiency  . Osteoarthritis     back and knee  . Insomnia   . Hyperlipidemia   . RLS (restless legs syndrome)   . Uterine fibroid   . Nephrolithiasis   . Vitamin D deficiency disease   .  Thoracic compression fracture ([place])   . Ovarian cyst   . DJD (degenerative joint disease)   . Diastolic dysfunction   . Mild aortic insufficiency     by echo 04/2013   Past Surgical History  Procedure Laterality Date  . Cholecystectomy    . Appendectomy    . Orthopedic surgeries      multiple  . Cesarean section      x 2  . Knee arthroplasty  09    lft partial  . Knee arthroplasty      rt  . Shoulder open rotator cuff repair      rt and lft  . Breast surgery      breast reduction  . Dorsal compartment release  06/05/2012    Procedure: RELEASE DORSAL COMPARTMENT (DEQUERVAIN);  Surgeon: Cammie Sickle., MD;  Location: [facility];  Service: Orthopedics;  Laterality: Left;  Excision of mixoid cyst also  . Carpal tunnel release  06/05/2012    Procedure: CARPAL TUNNEL RELEASE;  Surgeon: Cammie Sickle., MD;  Location: [place];  Service: Orthopedics;  Laterality: Left;  . Back surgery  ,2005    April  2013 - [facility]  . Tubal ligation      btsp  . Cardiac catheterization  04/2013  normal coronary arteries and normal LVF   Family History  Problem Relation Age of Onset  . Heart attack Father     46s  . Hypothyroidism Father   . Diabetes type II Father   . Diabetes type II Mother   . Hypertension Mother   . Breast cancer Mother 82  . Hypothyroidism Brother    Social History  Substance Use Topics  . Smoking status: Never Smoker   . Smokeless tobacco: Never Used  . Alcohol Use: No   OB History    Gravida Para Term Preterm AB TAB SAB Ectopic Multiple Living   2 2 2       2      Review of Systems  All other systems reviewed and are negative.     Allergies  Ambien; Crestor; Lipitor; Morphine and related; Statins; Amitriptyline; Ceftin; Lovaza; Lyrica; Metformin and related; Codeine; Lunesta; and Penicillins  Home Medications   Prior to Admission medications   Medication Sig Start Date End Date Taking? Authorizing  Provider  acetaminophen (TYLENOL) 500 MG tablet Take 1,000 mg by mouth every 6 (six) hours as needed for mild pain, moderate pain, fever or headache.    Yes Historical Provider, MD  amLODipine (NORVASC) 5 MG tablet Take 5 mg by mouth daily.   Yes Historical Provider, MD  aspirin 81 MG chewable tablet Chew 81 mg by mouth daily.   Yes Historical Provider, MD  Calcium Carbonate-Vitamin D (CALCIUM 600 + D PO) Take 2 tablets by mouth daily.    Yes Historical Provider, MD  cyclobenzaprine (FLEXERIL) 10 MG tablet Take 10 mg by mouth 3 (three) times daily as needed for muscle spasms.   Yes Historical Provider, MD  diphenhydrAMINE (BENADRYL) 25 mg capsule Take 25-50 mg by mouth every 6 (six) hours as needed for itching.   Yes Historical Provider, MD  DULoxetine (CYMBALTA) 60 MG capsule Take 60 mg by mouth daily.   Yes Historical Provider, MD  furosemide (LASIX) 20 MG tablet Take 20-40 mg by mouth daily as needed for fluid.   Yes Historical Provider, MD  gabapentin (NEURONTIN) 300 MG capsule Take 900 mg by mouth at bedtime.   Yes Historical Provider, MD  insulin NPH-regular Human (NOVOLIN 70/30) (70-30) 100 UNIT/ML injection Inject 60 Units into the skin 2 (two) times daily with a meal.   Yes Historical Provider, MD  levothyroxine (SYNTHROID, LEVOTHROID) 150 MCG tablet Take 150 mcg by mouth daily before breakfast.   Yes Historical Provider, MD  Multiple Vitamin (MULITIVITAMIN WITH MINERALS) TABS Take 1 tablet by mouth daily.    Yes Historical Provider, MD  nystatin (MYCOSTATIN) 100000 UNIT/ML suspension Take 5 mLs by mouth 4 (four) times daily as needed (for thrush).   Yes Historical Provider, MD  pantoprazole (PROTONIX) 40 MG tablet Take 40 mg by mouth 2 (two) times daily.   Yes Historical Provider, MD  promethazine (PHENERGAN) 25 MG tablet Take 25 mg by mouth every 6 (six) hours as needed for nausea or vomiting.    Yes Historical Provider, MD  temazepam (RESTORIL) 30 MG capsule Take 30 mg by mouth at bedtime  as needed for sleep.    Yes Historical Provider, MD  valsartan (DIOVAN) 320 MG tablet Take 320 mg by mouth daily.   Yes Historical Provider, MD  ciprofloxacin (CIPRO) 500 MG tablet Take 1 tablet (500 mg total) by mouth 2 (two) times daily. Patient not taking: Reported on 01/15/2016 12/11/15   Lacretia Leigh, MD  diazepam (VALIUM) 2 MG tablet Take  1 tablet (2 mg total) by mouth every 4 (four) hours as needed for muscle spasms. Patient not taking: Reported on 01/15/2016 12/11/15   Lacretia Leigh, MD  HYDROcodone-acetaminophen (NORCO/VICODIN) 5-325 MG per tablet Take 1 tablet by mouth every 4 (four) hours as needed for moderate pain or severe pain. Patient not taking: Reported on 01/15/2016 10/29/14   Antonietta Breach, PA-C  Vitamin D, Ergocalciferol, (DRISDOL) 50000 UNITS CAPS capsule TAKE ONE CAPSULE BY MOUTH EVERY OTHER WEEK Patient not taking: Reported on 10/28/2014 04/22/13   Regina Eck, CNM   Pulse 88  Temp(Src) 97.6 F (36.4 C) (Oral)  Resp 21  Ht 5' 3.5" (1.613 m)  Wt 200 lb (90.719 kg)  BMI 34.87 kg/m2  SpO2 96% Physical Exam  Constitutional: She is oriented to person, place, and time. She appears well-developed. No distress.  Overweight, appears older than stated age.  HENT:  Head: Normocephalic and atraumatic.  Right Ear: External ear normal.  Left Ear: External ear normal.  Eyes: Conjunctivae and EOM are normal. Pupils are equal, round, and reactive to light.  Neck: Normal range of motion and phonation normal. Neck supple.  Cardiovascular: Normal rate, regular rhythm and normal heart sounds.   Pulmonary/Chest: Effort normal and breath sounds normal. No respiratory distress. She exhibits tenderness (mild mid anterior without crepitation). She exhibits no bony tenderness.  Abdominal: Soft. Bowel sounds are normal. She exhibits no distension and no mass. There is no tenderness. There is no guarding.  Musculoskeletal: Normal range of motion. She exhibits no edema.  Neurological: She is alert  and oriented to person, place, and time. No cranial nerve deficit or sensory deficit. She exhibits normal muscle tone. Coordination normal.  Skin: Skin is warm, dry and intact.  Psychiatric: Her behavior is normal. Judgment and thought content normal.  Sad appearing  Nursing note and vitals reviewed.   ED Course  Procedures (including critical care time)  Initial clinical impression- nonspecific chest pain. Relatively recent cardiac catheterization, and EKG is reassuring. Will evaluate for occult cardiac injury,  Give IV fluids for hyperglycemia, and reassess.   Medications  0.9 %  sodium chloride infusion ( Intravenous Stopped 01/15/16 2050)  acetaminophen (TYLENOL) tablet 650 mg (not administered)  sodium chloride 0.9 % bolus 500 mL (0 mLs Intravenous Stopped 01/15/16 1909)  gi cocktail (Maalox,Lidocaine,Donnatal) (30 mLs Oral Given 01/15/16 1909)  promethazine (PHENERGAN) injection 12.5 mg (12.5 mg Intravenous Given 01/15/16 1810)    Patient Vitals for the past 24 hrs:  BP Temp Temp src Pulse Resp SpO2 Height Weight  01/15/16 2100 - - - 102 18 95 % - -  01/15/16 1845 171/76 mmHg - - 100 20 97 % - -  01/15/16 1815 163/78 mmHg - - 97 21 100 % - -  01/15/16 1800 164/78 mmHg - - 95 19 97 % - -  01/15/16 1730 155/81 mmHg - - 90 16 96 % - -  01/15/16 1715 156/76 mmHg - - 81 18 100 % - -  01/15/16 1704 - 97.6 F (36.4 C) Oral 88 21 96 % 5' 3.5" (1.613 m) 200 lb (90.719 kg)    10:09 PM Reevaluation with update and discussion. After initial assessment and treatment, an updated evaluation reveals She feels like her chest discomfort has returned. It continues to hurt with touch, and is worse with deep breathing and movement. Findings reviewed with the patient, and her family members, all questions were answered. [place] Review Labs Reviewed  CBC -  Abnormal; Notable for the following:    Hemoglobin 8.6 (*)    HCT 30.1 (*)    MCV 65.3 (*)    MCH 18.7 (*)    MCHC 28.6 (*)     RDW 17.1 (*)    All other components within normal limits  CBG MONITORING, ED - Abnormal; Notable for the following:    Glucose-Capillary 295 (*)    All other components within normal limits  URINE CULTURE  BASIC METABOLIC PANEL  URINALYSIS, ROUTINE W REFLEX MICROSCOPIC (NOT AT [facility])  I-STAT TROPOININ, ED   Component     Latest Ref Rng 12/11/2015 01/15/2016           WBC     4.0 - 10.5 K/uL 8.2 6.1  RBC     3.87 - 5.11 MIL/uL 5.35 (H) 4.61  Hemoglobin     12.0 - 15.0 g/dL 10.5 (L) 8.6 (L)  HCT     36.0 - 46.0 % 34.9 (L) 30.1 (L)  MCV     78.0 - 100.0 fL 65.2 (L) 65.3 (L)  MCH     26.0 - 34.0 pg 19.6 (L) 18.7 (L)  MCHC     30.0 - 36.0 g/dL 30.1 28.6 (L)  RDW     11.5 - 15.5 % 16.8 (H) 17.1 (H)  Platelets     150 - 400 K/uL 315 292   Imaging Review No results found. I have personally reviewed and evaluated these images and lab results as part of my medical decision-making.   EKG Interpretation   Date/Time:  Friday January 15 2016 17:05:10 EDT Ventricular Rate:  85 PR Interval:  184 QRS Duration: 88 QT Interval:  385 QTC Calculation: 458 R Axis:   -9 Text Interpretation:  Sinus rhythm Consider anterior infarct Minimal ST  elevation, inferior leads since last tracing no significant change  Confirmed by Jahari Wiginton  MD, Moneisha Vosler [id number]) on 01/15/2016 6:56:35 PM      MDM   Final diagnoses:  Nonspecific chest pain  Anemia, unspecified anemia type    Nonspecific chest pain, with reproducible chest wall tenderness. Incidental anemia, likely iron deficient. Urinalysis abnormal, following recent UTI, but not having current UTI symptoms. Urine culture ordered and pending. Doubt ACS, PE, pneumonia, or impending vascular collapse.  Nursing Notes Reviewed/ Care Coordinated Applicable Imaging Reviewed Interpretation of Laboratory Data incorporated into ED treatment  The patient appears reasonably screened and/or stabilized for discharge and I doubt any other medical condition or  other [facility] requiring further screening, evaluation, or treatment in the ED at this time prior to discharge.  Plan: Home Medications- APAP for pain, FeSO4 for anemia; Home Treatments- rest, heat; return here if the recommended treatment, does not improve the symptoms; Recommended follow up- PCP 2 weeks and prn       Daleen Bo, MD 01/15/16 2311

## 2016-01-15 NOTE — ED Notes (Signed)
Pt. Coming from home via [facility] [facility] for chest pain starting today while she was cutting vegetables. Pt. Hx of similar episodes and cardiac cath revealed nothing. Pt. Clammy  And diaphoretic upon EMS arrival. Pt. sts she has hx of anxiety, htn, and diabetes. Pt. CBG with EMS 384. Pt. Started on fluid bolus with EMS to lower sugar. Pt. Remains clammy and diaphoretic upon arrival to ED. Pt. Also c/o nausea.

## 2016-01-15 NOTE — ED Notes (Signed)
Patient transported to X-ray 

## 2016-01-15 NOTE — Discharge Instructions (Signed)
For the chest discomfort, use Tylenol every 4 hours and use heat on the sore area 3 or 4 times a day. You appear to have iron deficiency anemia. Take an iron pill twice a day, along with a stool softener to prevent constipation, for 1 month. Return here if needed, for problems.   Anemia, Nonspecific Anemia is a condition in which the concentration of red blood cells or hemoglobin in the blood is below normal. Hemoglobin is a substance in red blood cells that carries oxygen to the tissues of the body. Anemia results in not enough oxygen reaching these tissues.  CAUSES  Common causes of anemia include:   Excessive bleeding. Bleeding may be internal or external. This includes excessive bleeding from periods (in women) or from the intestine.   Poor nutrition.   Chronic kidney, thyroid, and liver disease.  Bone marrow disorders that decrease red blood cell production.  Cancer and treatments for cancer.  HIV, AIDS, and their treatments.  Spleen problems that increase red blood cell destruction.  Blood disorders.  Excess destruction of red blood cells due to infection, medicines, and autoimmune disorders. SIGNS AND SYMPTOMS   Minor weakness.   Dizziness.   Headache.  Palpitations.   Shortness of breath, especially with exercise.   Paleness.  Cold sensitivity.  Indigestion.  Nausea.  Difficulty sleeping.  Difficulty concentrating. Symptoms may occur suddenly or they may develop slowly.  DIAGNOSIS  Additional blood tests are often needed. These help your health care provider determine the best treatment. Your health care provider will check your stool for blood and look for other causes of blood loss.  TREATMENT  Treatment varies depending on the cause of the anemia. Treatment can include:   Supplements of iron, vitamin 123456, or folic acid.   Hormone medicines.   A blood transfusion. This may be needed if blood loss is severe.   Hospitalization. This may  be needed if there is significant continual blood loss.   Dietary changes.  Spleen removal. HOME CARE INSTRUCTIONS Keep all follow-up appointments. It often takes many weeks to correct anemia, and having your health care provider check on your condition and your response to treatment is very important. SEEK IMMEDIATE MEDICAL CARE IF:   You develop extreme weakness, shortness of breath, or chest pain.   You become dizzy or have trouble concentrating.  You develop heavy vaginal bleeding.   You develop a rash.   You have bloody or black, tarry stools.   You faint.   You vomit up blood.   You vomit repeatedly.   You have abdominal pain.  You have a fever or persistent symptoms for more than 2-3 days.   You have a fever and your symptoms suddenly get worse.   You are dehydrated.  MAKE SURE YOU:  Understand these instructions.  Will watch your condition.  Will get help right away if you are not doing well or get worse.   This information is not intended to replace advice given to you by your health care provider. Make sure you discuss any questions you have with your health care provider.   Document Released: 09/01/2004 Document Revised: 03/27/2013 Document Reviewed: 01/18/2013 Elsevier Interactive Patient Education 2016 [employer]  Nonspecific Chest Pain  Chest pain can be caused by many different conditions. There is always a chance that your pain could be related to something serious, such as a heart attack or a blood clot in your lungs. Chest pain can also be caused by conditions that  are not life-threatening. If you have chest pain, it is very important to follow up with your health care provider. CAUSES  Chest pain can be caused by:  Heartburn.  Pneumonia or bronchitis.  Anxiety or stress.  Inflammation around your heart (pericarditis) or lung (pleuritis or pleurisy).  A blood clot in your lung.  A collapsed lung (pneumothorax). It can  develop suddenly on its own (spontaneous pneumothorax) or from trauma to the chest.  Shingles infection (varicella-zoster virus).  Heart attack.  Damage to the bones, muscles, and cartilage that make up your chest wall. This can include:  Bruised bones due to injury.  Strained muscles or cartilage due to frequent or repeated coughing or overwork.  Fracture to one or more ribs.  Sore cartilage due to inflammation (costochondritis). RISK FACTORS  Risk factors for chest pain may include:  Activities that increase your risk for trauma or injury to your chest.  Respiratory infections or conditions that cause frequent coughing.  Medical conditions or overeating that can cause heartburn.  Heart disease or family history of heart disease.  Conditions or health behaviors that increase your risk of developing a blood clot.  Having had chicken pox (varicella zoster). SIGNS AND SYMPTOMS Chest pain can feel like:  Burning or tingling on the surface of your chest or deep in your chest.  Crushing, pressure, aching, or squeezing pain.  Dull or sharp pain that is worse when you move, cough, or take a deep breath.  Pain that is also felt in your back, neck, shoulder, or arm, or pain that spreads to any of these areas. Your chest pain may come and go, or it may stay constant. DIAGNOSIS Lab tests or other studies may be needed to find the cause of your pain. Your health care provider may have you take a test called an ambulatory ECG (electrocardiogram). An ECG records your heartbeat patterns at the time the test is performed. You may also have other tests, such as:  Transthoracic echocardiogram (TTE). During echocardiography, sound waves are used to create a picture of all of the heart structures and to look at how blood flows through your heart.  Transesophageal echocardiogram (TEE).This is a more advanced imaging test that obtains images from inside your body. It allows your health care  provider to see your heart in finer detail.  Cardiac monitoring. This allows your health care provider to monitor your heart rate and rhythm in real time.  Holter monitor. This is a portable device that records your heartbeat and can help to diagnose abnormal heartbeats. It allows your health care provider to track your heart activity for several days, if needed.  Stress tests. These can be done through exercise or by taking medicine that makes your heart beat more quickly.  Blood tests.  Imaging tests. TREATMENT  Your treatment depends on what is causing your chest pain. Treatment may include:  Medicines. These may include:  Acid blockers for heartburn.  Anti-inflammatory medicine.  Pain medicine for inflammatory conditions.  Antibiotic medicine, if an infection is present.  Medicines to dissolve blood clots.  Medicines to treat coronary artery disease.  Supportive care for conditions that do not require medicines. This may include:  Resting.  Applying heat or cold packs to injured areas.  Limiting activities until pain decreases. HOME CARE INSTRUCTIONS  If you were prescribed an antibiotic medicine, finish it all even if you start to feel better.  Avoid any activities that bring on chest pain.  Do not use any  tobacco products, including cigarettes, chewing tobacco, or electronic cigarettes. If you need help quitting, ask your health care provider.  Do not drink alcohol.  Take medicines only as directed by your health care provider.  Keep all follow-up visits as directed by your health care provider. This is important. This includes any further testing if your chest pain does not go away.  If heartburn is the cause for your chest pain, you may be told to keep your head raised (elevated) while sleeping. This reduces the chance that acid will go from your stomach into your esophagus.  Make lifestyle changes as directed by your health care provider. These may  include:  Getting regular exercise. Ask your health care provider to suggest some activities that are safe for you.  Eating a heart-healthy diet. A registered dietitian can help you to learn healthy eating options.  Maintaining a healthy weight.  Managing diabetes, if necessary.  Reducing stress. SEEK MEDICAL CARE IF:  Your chest pain does not go away after treatment.  You have a rash with blisters on your chest.  You have a fever. SEEK IMMEDIATE MEDICAL CARE IF:   Your chest pain is worse.  You have an increasing cough, or you cough up blood.  You have severe abdominal pain.  You have severe weakness.  You faint.  You have chills.  You have sudden, unexplained chest discomfort.  You have sudden, unexplained discomfort in your arms, back, neck, or jaw.  You have shortness of breath at any time.  You suddenly start to sweat, or your skin gets clammy.  You feel nauseous or you vomit.  You suddenly feel light-headed or dizzy.  Your heart begins to beat quickly, or it feels like it is skipping beats. These symptoms may represent a serious problem that is an emergency. Do not wait to see if the symptoms will go away. Get medical help right away. Call your local emergency services (911 in the [place] Do not drive yourself to the hospital.   This information is not intended to replace advice given to you by your health care provider. Make sure you discuss any questions you have with your health care provider.   Document Released: 05/04/2005 Document Revised: 08/15/2014 Document Reviewed: 02/28/2014 Elsevier Interactive Patient Education [employer].

## 2016-01-16 LAB — URINE CULTURE
Culture: NO GROWTH
Special Requests: NORMAL

## 2016-01-19 DIAGNOSIS — Z794 Long term (current) use of insulin: Secondary | ICD-10-CM | POA: Diagnosis not present

## 2016-01-19 DIAGNOSIS — E1165 Type 2 diabetes mellitus with hyperglycemia: Secondary | ICD-10-CM | POA: Diagnosis not present

## 2016-01-19 DIAGNOSIS — D509 Iron deficiency anemia, unspecified: Secondary | ICD-10-CM | POA: Diagnosis not present

## 2016-01-19 DIAGNOSIS — E039 Hypothyroidism, unspecified: Secondary | ICD-10-CM | POA: Diagnosis not present

## 2016-01-20 DIAGNOSIS — H43821 Vitreomacular adhesion, right eye: Secondary | ICD-10-CM | POA: Diagnosis not present

## 2016-01-20 DIAGNOSIS — E113311 Type 2 diabetes mellitus with moderate nonproliferative diabetic retinopathy with macular edema, right eye: Secondary | ICD-10-CM | POA: Diagnosis not present

## 2016-01-20 DIAGNOSIS — H3582 Retinal ischemia: Secondary | ICD-10-CM | POA: Diagnosis not present

## 2016-01-20 DIAGNOSIS — E113392 Type 2 diabetes mellitus with moderate nonproliferative diabetic retinopathy without macular edema, left eye: Secondary | ICD-10-CM | POA: Diagnosis not present

## 2016-02-03 DIAGNOSIS — D509 Iron deficiency anemia, unspecified: Secondary | ICD-10-CM | POA: Diagnosis not present

## 2016-02-03 DIAGNOSIS — Z794 Long term (current) use of insulin: Secondary | ICD-10-CM | POA: Diagnosis not present

## 2016-02-03 DIAGNOSIS — I10 Essential (primary) hypertension: Secondary | ICD-10-CM | POA: Diagnosis not present

## 2016-02-03 DIAGNOSIS — E039 Hypothyroidism, unspecified: Secondary | ICD-10-CM | POA: Diagnosis not present

## 2016-02-03 DIAGNOSIS — E119 Type 2 diabetes mellitus without complications: Secondary | ICD-10-CM | POA: Diagnosis not present

## 2016-02-03 DIAGNOSIS — K219 Gastro-esophageal reflux disease without esophagitis: Secondary | ICD-10-CM | POA: Diagnosis not present

## 2016-02-23 DIAGNOSIS — Z794 Long term (current) use of insulin: Secondary | ICD-10-CM | POA: Diagnosis not present

## 2016-02-23 DIAGNOSIS — E1165 Type 2 diabetes mellitus with hyperglycemia: Secondary | ICD-10-CM | POA: Diagnosis not present

## 2016-02-23 DIAGNOSIS — E039 Hypothyroidism, unspecified: Secondary | ICD-10-CM | POA: Diagnosis not present

## 2016-02-23 DIAGNOSIS — D509 Iron deficiency anemia, unspecified: Secondary | ICD-10-CM | POA: Diagnosis not present

## 2016-03-03 ENCOUNTER — Other Ambulatory Visit: Payer: Self-pay | Admitting: Gastroenterology

## 2016-03-03 DIAGNOSIS — K640 First degree hemorrhoids: Secondary | ICD-10-CM | POA: Diagnosis not present

## 2016-03-03 DIAGNOSIS — K2950 Unspecified chronic gastritis without bleeding: Secondary | ICD-10-CM | POA: Diagnosis not present

## 2016-03-03 DIAGNOSIS — D120 Benign neoplasm of cecum: Secondary | ICD-10-CM | POA: Diagnosis not present

## 2016-03-03 DIAGNOSIS — D509 Iron deficiency anemia, unspecified: Secondary | ICD-10-CM | POA: Diagnosis not present

## 2016-03-03 DIAGNOSIS — D490 Neoplasm of unspecified behavior of digestive system: Secondary | ICD-10-CM | POA: Diagnosis not present

## 2016-03-03 DIAGNOSIS — Z794 Long term (current) use of insulin: Secondary | ICD-10-CM | POA: Diagnosis not present

## 2016-03-03 DIAGNOSIS — E1165 Type 2 diabetes mellitus with hyperglycemia: Secondary | ICD-10-CM | POA: Diagnosis not present

## 2016-03-03 DIAGNOSIS — D126 Benign neoplasm of colon, unspecified: Secondary | ICD-10-CM | POA: Diagnosis not present

## 2016-03-18 DIAGNOSIS — E039 Hypothyroidism, unspecified: Secondary | ICD-10-CM | POA: Diagnosis not present

## 2016-03-21 ENCOUNTER — Other Ambulatory Visit: Payer: Self-pay | Admitting: General Surgery

## 2016-03-21 DIAGNOSIS — Z9049 Acquired absence of other specified parts of digestive tract: Secondary | ICD-10-CM | POA: Diagnosis not present

## 2016-03-21 DIAGNOSIS — M797 Fibromyalgia: Secondary | ICD-10-CM | POA: Diagnosis not present

## 2016-03-21 DIAGNOSIS — K219 Gastro-esophageal reflux disease without esophagitis: Secondary | ICD-10-CM | POA: Diagnosis not present

## 2016-03-21 DIAGNOSIS — D374 Neoplasm of uncertain behavior of colon: Secondary | ICD-10-CM | POA: Diagnosis not present

## 2016-03-21 DIAGNOSIS — Z98891 History of uterine scar from previous surgery: Secondary | ICD-10-CM | POA: Diagnosis not present

## 2016-03-21 DIAGNOSIS — Z794 Long term (current) use of insulin: Secondary | ICD-10-CM | POA: Diagnosis not present

## 2016-03-21 DIAGNOSIS — Z6837 Body mass index (BMI) 37.0-37.9, adult: Secondary | ICD-10-CM | POA: Diagnosis not present

## 2016-03-21 DIAGNOSIS — M549 Dorsalgia, unspecified: Secondary | ICD-10-CM | POA: Diagnosis not present

## 2016-03-21 DIAGNOSIS — Z96653 Presence of artificial knee joint, bilateral: Secondary | ICD-10-CM | POA: Diagnosis not present

## 2016-03-21 DIAGNOSIS — N183 Chronic kidney disease, stage 3 (moderate): Secondary | ICD-10-CM | POA: Diagnosis not present

## 2016-03-21 DIAGNOSIS — E119 Type 2 diabetes mellitus without complications: Secondary | ICD-10-CM | POA: Diagnosis not present

## 2016-03-21 DIAGNOSIS — D509 Iron deficiency anemia, unspecified: Secondary | ICD-10-CM | POA: Diagnosis not present

## 2016-03-24 ENCOUNTER — Other Ambulatory Visit: Payer: Self-pay | Admitting: General Surgery

## 2016-03-24 DIAGNOSIS — D374 Neoplasm of uncertain behavior of colon: Secondary | ICD-10-CM

## 2016-03-28 ENCOUNTER — Ambulatory Visit
Admission: RE | Admit: 2016-03-28 | Discharge: 2016-03-28 | Disposition: A | Discharge status: Home / Self Care | Payer: Medicare Other | Source: Ambulatory Visit | Attending: General Surgery | Admitting: General Surgery

## 2016-03-28 DIAGNOSIS — D374 Neoplasm of uncertain behavior of colon: Secondary | ICD-10-CM

## 2016-03-28 DIAGNOSIS — C189 Malignant neoplasm of colon, unspecified: Secondary | ICD-10-CM | POA: Diagnosis not present

## 2016-03-28 MED ORDER — IOPAMIDOL (ISOVUE-300) INJECTION 61%
125.0000 mL | Freq: Once | INTRAVENOUS | Status: AC | PRN
  Administered 2016-03-28: 125 mL via INTRAVENOUS

## 2016-03-31 ENCOUNTER — Encounter (HOSPITAL_COMMUNITY): Payer: Self-pay

## 2016-03-31 DIAGNOSIS — Z794 Long term (current) use of insulin: Secondary | ICD-10-CM | POA: Diagnosis not present

## 2016-03-31 DIAGNOSIS — E119 Type 2 diabetes mellitus without complications: Secondary | ICD-10-CM | POA: Diagnosis not present

## 2016-03-31 DIAGNOSIS — E039 Hypothyroidism, unspecified: Secondary | ICD-10-CM | POA: Diagnosis not present

## 2016-03-31 NOTE — Pre-Procedure Instructions (Signed)
RHONNA DRUGAN  03/31/2016      [id number] - [place], [place] - [address] [place] [address] Phone: [phone] Fax: [phone]    Your procedure is scheduled on Friday, April 08, 2016  Report to [facility] Admitting at 8:00 A.M.  Call this number if you have problems the morning of surgery:  [phone]   Remember: Follow doctors instructions regarding bowel prep; drink plenty of clear liquids on day of prep to prevent dehydration.   Do not eat food or drink liquids after midnight Thursday, April 07, 2016  Take these medicines the morning of surgery with A SIP OF WATER : Amlodipine ( Norvasc), Duloxetine   (Cymbalta), Levothyroxine ( Synthroid), Pantoprazole (Protonix), if needed: pain medication,  Promethazine ( Phenergan) for nausea/vomiting, Flonase nasal spray for allergies   Stop taking Aspirin, vitamins, fish oil and herbal medications. Do not take any NSAIDs ie: Ibuprofen, Advil, Naproxen, BC and Goody powder or any medication containing Aspirin; stop now.   WHAT DO I DO ABOUT MY DIABETES MEDICATION?    Marland Kitchen Do not take oral diabetes medicines (pills) the morning of surgery.  . THE NIGHT BEFORE SURGERY, take ______49_____ units of __Novolin 70/30_________insulin.       . THE MORNING OF SURGERY, take NO insulin.  . The day of surgery, do not take other diabetes injectables, including Byetta (exenatide), Bydureon (exenatide ER), Victoza (liraglutide), or Trulicity (dulaglutide).  . If your CBG is greater than 220 mg/dL, you may take  of your sliding scale (correction) dose of insulin.  How to Manage Your Diabetes Before and After Surgery  Why is it important to control my blood sugar before and after surgery? . Improving blood sugar levels before and after surgery helps healing and can limit problems. . A way of improving blood sugar control is eating a healthy diet by: o   Eating less sugar and carbohydrates o  Increasing activity/exercise o  Talking with your doctor about reaching your blood sugar goals . High blood sugars (greater than 180 mg/dL) can raise your risk of infections and slow your recovery, so you will need to focus on controlling your diabetes during the weeks before surgery. . Make sure that the doctor who takes care of your diabetes knows about your planned surgery including the date and location.  How do I manage my blood sugar before surgery? . Check your blood sugar at least 4 times a day, starting 2 days before surgery, to make sure that the level is not too high or low. o Check your blood sugar the morning of your surgery when you wake up and every 2 hours until you get to the Short Stay unit. . If your blood sugar is less than 70 mg/dL, you will need to treat for low blood sugar: o Do not take insulin. o Treat a low blood sugar (less than 70 mg/dL) with  cup of clear juice (cranberry or apple), 4 glucose tablets, OR glucose gel. o Recheck blood sugar in 15 minutes after treatment (to make sure it is greater than 70 mg/dL). If your blood sugar is not greater than 70 mg/dL on recheck, call [phone] for further instructions. . Report your blood sugar to the short stay nurse when you get to Short Stay.  . If you are admitted to the hospital after surgery: o Your blood sugar will be checked by the staff and  you will probably be given insulin after surgery (instead of oral diabetes medicines) to make sure you have good blood sugar levels. o The goal for blood sugar control after surgery is 80-180 mg/dL.    Do not wear jewelry, make-up or nail polish.  Do not wear lotions, powders, or perfumes, or deoderant.  Do not shave 48 hours prior to surgery.    Do not bring valuables to the hospital.  [facility] is not responsible for any belongings or valuables.  Contacts, dentures or bridgework may not be worn into surgery.  Leave your suitcase  in the car.  After surgery it may be brought to your room.  For patients admitted to the hospital, discharge time will be determined by your treatment team.  Patients discharged the day of surgery will not be allowed to drive home.   Name and phone number of your driver:   Special instructions: [place]- Preparing For Surgery  Before surgery, you can play an important role. Because skin is not sterile, your skin needs to be as free of germs as possible. You can reduce the number of germs on your skin by washing with CHG (chlorahexidine gluconate) Soap before surgery.  CHG is an antiseptic cleaner which kills germs and bonds with the skin to continue killing germs even after washing.  Please do not use if you have an allergy to CHG or antibacterial soaps. If your skin becomes reddened/irritated stop using the CHG.  Do not shave (including legs and underarms) for at least 48 hours prior to first CHG shower. It is OK to shave your face.  Please follow these instructions carefully.   1. Shower the NIGHT BEFORE SURGERY and the MORNING OF SURGERY with CHG.   2. If you chose to wash your hair, wash your hair first as usual with your normal shampoo.  3. After you shampoo, rinse your hair and body thoroughly to remove the shampoo.  4. Use CHG as you would any other liquid soap. You can apply CHG directly to the skin and wash gently with a scrungie or a clean washcloth.   5. Apply the CHG Soap to your body ONLY FROM THE NECK DOWN.  Do not use on open wounds or open sores. Avoid contact with your eyes, ears, mouth and genitals (private parts). Wash genitals (private parts) with your normal soap.  6. Wash thoroughly, paying special attention to the area where your surgery will be performed.  7. Thoroughly rinse your body with warm water from the neck down.  8. DO NOT shower/wash with your normal soap after using and rinsing off the CHG Soap.  9. Pat yourself dry with a CLEAN TOWEL.    10. Wear CLEAN PAJAMAS   11. Place CLEAN SHEETS on your bed the night of your first shower and DO NOT SLEEP WITH PETS.    Day of Surgery: Do not apply any deodorants/lotions. Please wear clean clothes to the hospital/surgery center.      Please read over the following fact sheets that you were given. Pain Booklet, Coughing and Deep Breathing, Blood Transfusion Information and Surgical Site Infection Prevention

## 2016-04-01 ENCOUNTER — Encounter (HOSPITAL_COMMUNITY): Payer: Self-pay

## 2016-04-01 ENCOUNTER — Encounter (HOSPITAL_COMMUNITY)
Admission: RE | Admit: 2016-04-01 | Discharge: 2016-04-01 | Disposition: A | Discharge status: Home / Self Care | Payer: Medicare Other | Source: Ambulatory Visit | Attending: General Surgery | Admitting: General Surgery

## 2016-04-01 DIAGNOSIS — Z0183 Encounter for blood typing: Secondary | ICD-10-CM | POA: Insufficient documentation

## 2016-04-01 DIAGNOSIS — Z7982 Long term (current) use of aspirin: Secondary | ICD-10-CM | POA: Insufficient documentation

## 2016-04-01 DIAGNOSIS — D509 Iron deficiency anemia, unspecified: Secondary | ICD-10-CM | POA: Diagnosis not present

## 2016-04-01 DIAGNOSIS — Z794 Long term (current) use of insulin: Secondary | ICD-10-CM | POA: Diagnosis not present

## 2016-04-01 DIAGNOSIS — E119 Type 2 diabetes mellitus without complications: Secondary | ICD-10-CM | POA: Insufficient documentation

## 2016-04-01 DIAGNOSIS — Z79899 Other long term (current) drug therapy: Secondary | ICD-10-CM | POA: Diagnosis not present

## 2016-04-01 DIAGNOSIS — E039 Hypothyroidism, unspecified: Secondary | ICD-10-CM | POA: Diagnosis not present

## 2016-04-01 DIAGNOSIS — D374 Neoplasm of uncertain behavior of colon: Secondary | ICD-10-CM | POA: Diagnosis not present

## 2016-04-01 DIAGNOSIS — E785 Hyperlipidemia, unspecified: Secondary | ICD-10-CM | POA: Insufficient documentation

## 2016-04-01 DIAGNOSIS — Z01818 Encounter for other preprocedural examination: Secondary | ICD-10-CM | POA: Insufficient documentation

## 2016-04-01 DIAGNOSIS — Z01812 Encounter for preprocedural laboratory examination: Secondary | ICD-10-CM | POA: Insufficient documentation

## 2016-04-01 DIAGNOSIS — I10 Essential (primary) hypertension: Secondary | ICD-10-CM | POA: Diagnosis not present

## 2016-04-01 DIAGNOSIS — Z981 Arthrodesis status: Secondary | ICD-10-CM | POA: Diagnosis not present

## 2016-04-01 DIAGNOSIS — Z86718 Personal history of other venous thrombosis and embolism: Secondary | ICD-10-CM | POA: Insufficient documentation

## 2016-04-01 HISTORY — DX: Family history of other specified conditions: Z84.89

## 2016-04-01 LAB — CBC WITH DIFFERENTIAL/PLATELET
BASOS ABS: 0.1 10*3/uL (ref 0.0–0.1)
BASOS PCT: 1 %
EOS ABS: 0.2 10*3/uL (ref 0.0–0.7)
Eosinophils Relative: 3 %
HCT: 36.9 % (ref 36.0–46.0)
Hemoglobin: 10.9 g/dL — ABNORMAL LOW (ref 12.0–15.0)
LYMPHS PCT: 27 %
Lymphs Abs: 1.7 10*3/uL (ref 0.7–4.0)
MCH: 21.3 pg — AB (ref 26.0–34.0)
MCHC: 29.5 g/dL — ABNORMAL LOW (ref 30.0–36.0)
MCV: 72.2 fL — ABNORMAL LOW (ref 78.0–100.0)
MONO ABS: 0.5 10*3/uL (ref 0.1–1.0)
Monocytes Relative: 8 %
NEUTROS PCT: 61 %
Neutro Abs: 3.9 10*3/uL (ref 1.7–7.7)
PLATELETS: 338 10*3/uL (ref 150–400)
RBC: 5.11 MIL/uL (ref 3.87–5.11)
RDW: 17.9 % — ABNORMAL HIGH (ref 11.5–15.5)
WBC: 6.4 10*3/uL (ref 4.0–10.5)

## 2016-04-01 LAB — TYPE AND SCREEN
ABO/RH(D): O POS
ANTIBODY SCREEN: NEGATIVE

## 2016-04-01 LAB — COMPREHENSIVE METABOLIC PANEL
ALBUMIN: 3.9 g/dL (ref 3.5–5.0)
ALT: 16 U/L (ref 14–54)
AST: 19 U/L (ref 15–41)
Alkaline Phosphatase: 109 U/L (ref 38–126)
Anion gap: 7 (ref 5–15)
BUN: 10 mg/dL (ref 6–20)
CHLORIDE: 106 mmol/L (ref 101–111)
CO2: 26 mmol/L (ref 22–32)
CREATININE: 0.93 mg/dL (ref 0.44–1.00)
Calcium: 9.5 mg/dL (ref 8.9–10.3)
GFR calc non Af Amer: >60 mL/min (ref >60)
GLUCOSE: 82 mg/dL (ref 65–99)
Potassium: 4.1 mmol/L (ref 3.5–5.1)
SODIUM: 139 mmol/L (ref 135–145)
Total Bilirubin: 0.5 mg/dL (ref 0.3–1.2)
Total Protein: 6.8 g/dL (ref 6.5–8.1)

## 2016-04-01 LAB — GLUCOSE, CAPILLARY: GLUCOSE-CAPILLARY: 128 mg/dL — AB (ref 65–99)

## 2016-04-01 NOTE — Progress Notes (Signed)
PCP - Rivereno  Chest x-ray - 01/15/16 EKG - 01/15/16 Stress Test - 05/02/13 ECHO - 05/02/13 Cardiac Cath - 05/03/13  Patient checks blood sugar at home at least twice a day.   STOP BANG tool send to PCP  Patient saw endocrinologist who developed a plan for insulin management before surgery  Patient also passed out once with iv start she gets very anxious  About it    Patient denies shortness of breath, fever, cough and chest pain at PAT appointment

## 2016-04-01 NOTE — Progress Notes (Signed)
   04/01/16 1014  OBSTRUCTIVE SLEEP APNEA  Have you ever been diagnosed with sleep apnea through a sleep study? No  Do you snore loudly (loud enough to be heard through closed doors)?  1  Do you often feel tired, fatigued, or sleepy during the daytime (such as falling asleep during driving or talking to someone)? 0  Has anyone observed you stop breathing during your sleep? 0  Do you have, or are you being treated for high blood pressure? 1  BMI more than 35 kg/m2? 1  Age > 50 (1-yes) 1  Neck circumference greater than:Female 16 inches or larger, Female 17inches or larger? 1  Female Gender (Yes=1) 0  Obstructive Sleep Apnea Score 5  Score 5 or greater  Results sent to PCP

## 2016-04-02 LAB — CEA: CEA: 1.8 ng/mL (ref 0.0–4.7)

## 2016-04-02 LAB — HEMOGLOBIN A1C
Hgb A1c MFr Bld: 8.7 % — ABNORMAL HIGH (ref 4.8–5.6)
Mean Plasma Glucose: 203 mg/dL

## 2016-04-04 NOTE — Progress Notes (Signed)
Anesthesia Chart Review:  Pt is a 65 year old female scheduled for laparoscopic assisted ascending colectomy, possible open colectomy on 04/08/2016 with Fanny Skates, MD.   PCP is Harlan Stains, MD.   PMH includes:  HTN, DM, hyperlipidemia, DVT, hypothyroidism, iron deficiency anemia, post-op N/V. GERD. Never smoker. BMI 38. S/p L carpal tunnel release 06/05/12. S/p lumbar fusion 11/15/11.   Medications include: amlodipine, ASA, lasix, novolin 70/30, levothyroxine, protonix, valsartan  Preoperative labs reviewed.  HgbA1c 8.7, glucose 82  chest X-ray 01/15/16: No active cardiopulmonary disease.  EKG 01/15/16: Sinus rhythm. Consider anterior infarct. Minimal ST elevation, inferior leads. Anterior infarct present on EKG dating back to 05/01/13 prior to cardiac cath.   Cardiac cath 05/03/16 (for abnormal stress test):  1. No angiographically significant CAD 2. Normal left ventricular systolic function.  LVEDP 18 mmHg.  Ejection fraction 60%.  Echo 05/02/13:  - Left ventricle: Wall thickness was increased in a pattern of moderate LVH. There was moderate concentric hypertrophy. Systolic function was normal. The estimated ejection fraction was in the range of 60% to 65%. Doppler parameters are consistent with abnormal left ventricular relaxation (grade 1 diastolic dysfunction). - Aortic valve: Mild regurgitation.  Nuclear stress test 05/02/13:  1.  Possible small area of pharmacologically induced ischemia involving the inferior lateral wall of the left ventricle. 2.  No scintigraphic evidence of prior infarction. 3.  Normal wall motion.  Ejection fraction - 63%.  If no changes, I anticipate pt can proceed with surgery as scheduled.   Willeen Cass, FNP-BC [facility] Short Stay Surgical [facility]/Anesthesiology Phone: [phone] 04/04/2016 4:22 PM

## 2016-04-07 NOTE — H&P (Signed)
[name]. [name] Location: [facility] Patient #: [id number] DOB: [birth year] Married / Language: English / Race: White Female       History of Present Illness  Patient words: colon polyps.  The patient is a 65 year old female who presents with a colonic mass. This is a 65 year old Caucasian female from [place], [place]. She is referred by Dr. Carman Ching for management of a villous adenoma with high-grade dysplasia of the cecum. Her husband is with her throughout the encounter. Dr. [name] is her PCP. Dr. Talmage Coin is her endocrinologist.  She was initially found to have iron deficiency anemia. Hemoglobin 9.8. Repeat 10.0. She's had anemia in the past. Last upper endoscopy and colonoscopy 2009 were normal.  Recent colonoscopy shows a 3 cm exophytic mass around the ileocecal valve. Biopsy showed villous adenoma with high-grade dysplasia. She has not had any further imaging studies. Symptomatically she's had some epigastric discomfort which sounds like chronic GERD. No weight loss. No nausea or vomiting. She has a bowel movement every 3-4 days which is normal for her. Good appetite. Has seen any blood in her stool. Stools are dark. No vaginal bleeding.  Comorbidities include insulin-dependent diabetes. She takes insulin twice a day. Her blood sugars were poorly controlled around the time of her colonoscopy. They're better now. Fibromyalgia. Hypertension. Hyperlipidemia. CK D stage III. Chronic GERD. History of uterine fibroids. History laparoscopic appendectomy by Dr. Daphine Deutscher. History open cholecystectomy by Dr. Earlene Plater for gangrenous gallbladder according to her. She's had 2 C-sections through lower midline incision. One was urgent. She has a BMI of 37.  Family history reveals mother living with dementia, hypertension, diabetes. Father deceased at melanoma and diabetes.  Social history reveals that she lives in [place]. Her husband is here with  her. She knows the [place]  family. Denies alcohol or tobacco. Retired from [employer]. The baby sits for 75-year-old grandchild 3 or 4 days a week.  I described the anatomy of her colonic neoplasm. I told her it was precancerous. Told her that it is possible there could be invasive cancer present at this time we just don't know. I advised her to undergo elective laparoscopic-assisted right colectomy and she agrees. I discussed the indications, details, techniques, and numerous risk of the surgery with her and her husband. They're aware of the risk of bleeding, infection, conversion to open laparotomy, wound healing problems such as hernia or dehiscence, injury to adjacent organs requiring major reconstructive surgery, cardiac pulmonary and thromboembolic problems. She knows that she may have adhesions, minimal or significant that will possibly slow the surgery down, and possibly could require converting to open if they're bad. She accepts all of this. She understands all these issues well. At this time all questions were answered. She agrees with this plan.  Plan preop CT scan abdomen and pelvis with contrast. This is being scheduled.  I've asked her to take MiraLAX daily for about 5 days due to her chronic constipation. I've asked her to make an appointment with Dr. Sharl Ma to make sure her diabetes is under very good control in the perioperative period we may need insulin drip in the perioperative period I described a mechanical and antibiotic bowel prep and she has been given those instructions and the antibiotics have been called in. We will set this surgery up in the near future at the hospital of her choice. We will use Entereg periop..  Transfusion if indicated   Addendum Note(Kanyon Bunn M. Derrell Lolling MD; 03/29/2016 2:58 PM)  CT scan abdomen and pelvis shows no obvious lesion of the colon, with specific attention to the right colon. There is a 9 mm low-density right hepatic lobe  lesion, indeterminate. Further evaluation with dedicated pre-and post contrast abdominal MRI was suggested. I called Mrs. Secrest and discussed this with her. I told her there was no indication for biopsy or further imaging at this time. We will proceed with her right colon resection. If this shows a benign villous adenoma then we will repeat her CT scan in 6 months. If this shows a colon cancer then we will proceed with MRI, referral to oncology, and possible PET scan. She understands all of this.   Other Problems  Anxiety Disorder Arthritis Back Pain Cholelithiasis Depression Diabetes Mellitus Gastroesophageal Reflux Disease High blood pressure Hypercholesterolemia Thyroid Disease  Past Surgical History Appendectomy Cataract Surgery Bilateral. Cesarean Section - Multiple Gallbladder Surgery - Open Knee Surgery Bilateral. Mammoplasty; Reduction Bilateral. Shoulder Surgery Bilateral. Spinal Surgery - Lower Back  Diagnostic Studies History  Colonoscopy within last year Mammogram within last year Pap Smear 1-5 years ago  Allergies  Ambien *HYPNOTICS/SEDATIVES/SLEEP DISORDER AGENTS* Crestor *ANTIHYPERLIPIDEMICS* Lipitor *ANTIHYPERLIPIDEMICS* Itching. Morphine Derivatives Statins Amitriptyline HCl *ANTIDEPRESSANTS* Ceftin *CEPHALOSPORINS* Lovaza *ANTIHYPERLIPIDEMICS* Lyrica *ANTICONVULSANTS* Nausea. MetFORMIN HCl *ANTIDIABETICS* Nausea. Codeine Phosphate *ANALGESICS - OPIOID* Nausea, Vomiting. Lunesta *HYPNOTICS/SEDATIVES/SLEEP DISORDER AGENTS* Penicillins Rash.  Medication History  IBU (800MG  Tablet, Oral) Active. Norvasc (5MG  Tablet, Oral) Active. Aspirin (81MG  Tablet DR, Oral) Active. Calcium-Vitamin D (600MG  Tablet Chewable, Oral) Active. Benadryl Allergy (25MG  Capsule, Oral as needed) Active. Cymbalta (60MG  Capsule DR Part, Oral) Active. Lasix (20MG  Tablet, Oral as needed) Active. Norco (5-325MG  Tablet, Oral as  needed) Active. NovoLIN 70/30 ((70-30) 100UNIT/ML Suspension, Subcutaneous) Active. Synthroid ( Tablet, Oral) Active. Protonix (40MG  Tablet DR, Oral) Active. Phenergan (25MG  Tablet, Oral) Active. Restoril (30MG  Capsule, Oral) Active. Diovan (320MG  Tablet, Oral) Active.  Social History  Caffeine use Coffee. No alcohol use No drug use Tobacco use Never smoker.  Family History Arthritis Brother, Father, Mother. Bleeding disorder Mother. Breast Cancer Family Members In General, Mother. Cerebrovascular Accident Mother. Diabetes Mellitus Brother, Father, Mother. Heart Disease Father. Hypertension Brother, Father, Mother. Kidney Disease Father. Melanoma Father. Migraine Headache Son. Prostate Cancer Brother. Thyroid problems Brother, Father.  Pregnancy / Birth History  Age at menarche 10 years. Age of menopause 51-55 Contraceptive History Oral contraceptives. Gravida 2 Maternal age 43-25 Para 2    Review of Systems  General Present- Fatigue and Night Sweats. Not Present- Appetite Loss, Chills, Fever, Weight Gain and Weight Loss. Skin Not Present- Change in Wart/Mole, Dryness, Hives, Jaundice, New Lesions, Non-Healing Wounds, Rash and Ulcer. HEENT Not Present- Earache, Hearing Loss, Hoarseness, Nose Bleed, Oral Ulcers, Ringing in the Ears, Seasonal Allergies, Sinus Pain, Sore Throat, Visual Disturbances, Wears glasses/contact lenses and Yellow Eyes. Respiratory Not Present- Bloody sputum, Chronic Cough, Difficulty Breathing, Snoring and Wheezing. Breast Not Present- Breast Mass, Breast Pain, Nipple Discharge and Skin Changes. Cardiovascular Not Present- Chest Pain, Difficulty Breathing Lying Down, Leg Cramps, Palpitations, Rapid Heart Rate, Shortness of Breath and Swelling of Extremities. Gastrointestinal Not Present- Abdominal Pain, Bloating, Bloody Stool, Change in Bowel Habits, Chronic diarrhea, Constipation, Difficulty Swallowing, Excessive  gas, Gets full quickly at meals, Hemorrhoids, Indigestion, Nausea, Rectal Pain and Vomiting. Female Genitourinary Not Present- Frequency, Nocturia, Painful Urination, Pelvic Pain and Urgency. Musculoskeletal Present- Back Pain, Joint Pain and Muscle Pain. Not Present- Joint Stiffness, Muscle Weakness and Swelling of Extremities. Neurological Not Present- Decreased Memory, Fainting, Headaches, Numbness, Seizures, Tingling, Tremor, Trouble walking and  Weakness. Psychiatric Present- Anxiety and Depression. Not Present- Bipolar, Change in Sleep Pattern, Fearful and Frequent crying. Endocrine Present- Heat Intolerance. Not Present- Cold Intolerance, Excessive Hunger, Hair Changes, Hot flashes and New Diabetes. Hematology Not Present- Blood Thinners, Easy Bruising, Excessive bleeding, Gland problems, HIV and Persistent Infections.  Vitals  Weight: 213.2 lb Height: 63in Body Surface Area: 1.99 m Body Mass Index: 37.77 kg/m  Temp.: 98.63F(Oral)  Pulse: 102 (Regular)  BP: 152/104 (Sitting, Right Arm, Standard) BP recheck 154/106 right arm sitting 5 minutes later    Physical Exam  General Mental Status-Alert. General Appearance-Consistent with stated age. Hydration-Well hydrated. Voice-Normal. Note: BMI 37. Central obesity.   Head and Neck Head-normocephalic, atraumatic with no lesions or palpable masses. Trachea-midline. Thyroid Gland Characteristics - normal size and consistency.  Eye Eyeball - Bilateral-Extraocular movements intact. Sclera/Conjunctiva - Bilateral-No scleral icterus.  Chest and Lung Exam Chest and lung exam reveals -quiet, even and easy respiratory effort with no use of accessory muscles and on auscultation, normal breath sounds, no adventitious sounds and normal vocal resonance. Inspection Chest Wall - Normal. Back - normal.  Cardiovascular Cardiovascular examination reveals -normal heart sounds, regular rate and rhythm with no  murmurs and normal pedal pulses bilaterally. Note: Excellent dorsalis pedis pulses bilaterally   Abdomen Inspection Inspection of the abdomen reveals - No Hernias. Palpation/Percussion Palpation and Percussion of the abdomen reveal - Soft, Non Tender, No Rebound tenderness, No Rigidity (guarding) and No hepatosplenomegaly. Auscultation Auscultation of the abdomen reveals - Bowel sounds normal. Note: No organomegaly. Long right subcostal scar no hernia. Lower midline scar with big suture marks. No hernia. Laparoscopic scars. Nontender. No mass.   Neurologic Neurologic evaluation reveals -alert and oriented x 3 with no impairment of recent or remote memory. Mental Status-Normal.  Musculoskeletal Normal Exam - Left-Upper Extremity Strength Normal and Lower Extremity Strength Normal. Normal Exam - Right-Upper Extremity Strength Normal and Lower Extremity Strength Normal. Note: Long anterior scar right knee. Shorter anterior scar left knee.   Lymphatic Head & Neck  General Head & Neck Lymphatics: Bilateral - Description - Normal. Axillary  General Axillary Region: Bilateral - Description - Normal. Tenderness - Non Tender. Femoral & Inguinal  Generalized Femoral & Inguinal Lymphatics: Bilateral - Description - Normal. Tenderness - Non Tender.    Assessment & Plan  NEOPLASM OF UNCERTAIN BEHAVIOR OF ASCENDING COLON (D37.4) Current Plans Pt Education - Pamphlet Given - Laparoscopic Colorectal Surgery: discussed with patient and provided information.  Your recent colonoscopy shows a precancerous mass in the right colon. The biopsy showed villous adenoma with high-grade dysplasia. Hopefully this has not changed into cancer yet, but we cannot know for sure from the biopsies that have been done  You'll be scheduled for a laparoscopic assisted right colectomy, possible open colectomy. We have discussed the indications, techniques, and numerous risk of this surgery You  will need to go undergo an antibiotic prep the day before surgery You will also need to be given lots of laxatives the day before surgery, and we have gone over all of this with you  You will be scheduled for a CT scan of the abdomen and pelvis to make sure we were not missing something  I suggest that you see Dr. Sharl Ma preop to get good control of your diabetes pre op.  Take a daily walk around the neighborhood Stay well hydrated Take MiraLAX once or twice a day for about 5 days prior to surgery since you are chronically constipated.  Please review the patient  information that a booklet I've given you.  Started Neomycin Sulfate 500MG , 2 (two) Tablet SEE NOTE, #6, 03/21/2016, No Refill. Local Order: TAKE TWO TABLETS AT 2 PM, 3 PM, AND 10 PM THE DAY PRIOR TO SURGERY Started Flagyl 500MG , 2 (two) Tablet SEE NOTE, #6, 03/21/2016, No Refill. Local Order: Take at 2pm, 3pm, and 10pm the day prior to your colon operation  Pt Education - [facility] Colectomy post-op instructions: discussed with patient and provided information.  DIABETES MELLITUS WITH INSULIN THERAPY (E11.9) CKD (CHRONIC KIDNEY DISEASE), STAGE III (N18.3) BMI 37.0-37.9, ADULT (Z68.37) HISTORY OF CHOLECYSTECTOMY (Z90.49) Impression: Open cholecystectomy for gangrene. Dr. Kendrick Ranch. HISTORY OF CESAREAN SECTION, LOW VERTICAL (Z98.891) HISTORY OF LAPAROSCOPIC APPENDECTOMY (Z90.49) FIBROMYALGIA (M79.7) HISTORY OF BILATERAL KNEE REPLACEMENT (K74.259) Impression: Total replacement on right. Partial replacement on left. CHRONIC GERD (K21.9) IRON DEFICIENCY ANEMIA, UNSPECIFIED IRON DEFICIENCY ANEMIA TYPE (D50.9) CHRONIC BACK PAIN GREATER THAN 3 MONTHS DURATION (M54.9)   Angelia Mould. Derrell Lolling, M.D., [facility] General and Minimally invasive Surgery Breast and Colorectal Surgery Office:   [phone] Pager:   [phone]

## 2016-04-08 ENCOUNTER — Inpatient Hospital Stay (HOSPITAL_COMMUNITY): Payer: Medicare Other | Admitting: Emergency Medicine

## 2016-04-08 ENCOUNTER — Inpatient Hospital Stay (HOSPITAL_COMMUNITY)
Admission: RE | Admit: 2016-04-08 | Discharge: 2016-04-12 | DRG: 330 | Disposition: A | Discharge status: Home / Self Care | Payer: Medicare Other | Source: Ambulatory Visit | Attending: General Surgery | Admitting: General Surgery

## 2016-04-08 ENCOUNTER — Encounter (HOSPITAL_COMMUNITY)
Admission: RE | Disposition: A | Discharge status: Home / Self Care | Payer: Self-pay | Source: Ambulatory Visit | Attending: General Surgery

## 2016-04-08 ENCOUNTER — Encounter (HOSPITAL_COMMUNITY): Payer: Self-pay | Admitting: Surgery

## 2016-04-08 ENCOUNTER — Inpatient Hospital Stay (HOSPITAL_COMMUNITY): Payer: Medicare Other | Admitting: Anesthesiology

## 2016-04-08 DIAGNOSIS — M797 Fibromyalgia: Secondary | ICD-10-CM | POA: Diagnosis present

## 2016-04-08 DIAGNOSIS — Z96653 Presence of artificial knee joint, bilateral: Secondary | ICD-10-CM | POA: Diagnosis present

## 2016-04-08 DIAGNOSIS — K5909 Other constipation: Secondary | ICD-10-CM | POA: Diagnosis present

## 2016-04-08 DIAGNOSIS — I429 Cardiomyopathy, unspecified: Secondary | ICD-10-CM | POA: Diagnosis not present

## 2016-04-08 DIAGNOSIS — D374 Neoplasm of uncertain behavior of colon: Secondary | ICD-10-CM | POA: Diagnosis not present

## 2016-04-08 DIAGNOSIS — I129 Hypertensive chronic kidney disease with stage 1 through stage 4 chronic kidney disease, or unspecified chronic kidney disease: Secondary | ICD-10-CM | POA: Diagnosis present

## 2016-04-08 DIAGNOSIS — R110 Nausea: Secondary | ICD-10-CM | POA: Diagnosis not present

## 2016-04-08 DIAGNOSIS — N183 Chronic kidney disease, stage 3 (moderate): Secondary | ICD-10-CM | POA: Diagnosis present

## 2016-04-08 DIAGNOSIS — R0902 Hypoxemia: Secondary | ICD-10-CM | POA: Diagnosis not present

## 2016-04-08 DIAGNOSIS — Z6838 Body mass index (BMI) 38.0-38.9, adult: Secondary | ICD-10-CM

## 2016-04-08 DIAGNOSIS — K660 Peritoneal adhesions (postprocedural) (postinfection): Secondary | ICD-10-CM | POA: Diagnosis not present

## 2016-04-08 DIAGNOSIS — D120 Benign neoplasm of cecum: Secondary | ICD-10-CM | POA: Diagnosis present

## 2016-04-08 DIAGNOSIS — Z7982 Long term (current) use of aspirin: Secondary | ICD-10-CM | POA: Diagnosis not present

## 2016-04-08 DIAGNOSIS — Z79899 Other long term (current) drug therapy: Secondary | ICD-10-CM

## 2016-04-08 DIAGNOSIS — K219 Gastro-esophageal reflux disease without esophagitis: Secondary | ICD-10-CM | POA: Diagnosis not present

## 2016-04-08 DIAGNOSIS — F329 Major depressive disorder, single episode, unspecified: Secondary | ICD-10-CM | POA: Diagnosis present

## 2016-04-08 DIAGNOSIS — E1165 Type 2 diabetes mellitus with hyperglycemia: Secondary | ICD-10-CM | POA: Diagnosis present

## 2016-04-08 DIAGNOSIS — E1100 Type 2 diabetes mellitus with hyperosmolarity without nonketotic hyperglycemic-hyperosmolar coma (NKHHC): Secondary | ICD-10-CM | POA: Diagnosis not present

## 2016-04-08 DIAGNOSIS — E785 Hyperlipidemia, unspecified: Secondary | ICD-10-CM | POA: Diagnosis present

## 2016-04-08 DIAGNOSIS — D126 Benign neoplasm of colon, unspecified: Secondary | ICD-10-CM | POA: Diagnosis not present

## 2016-04-08 DIAGNOSIS — E039 Hypothyroidism, unspecified: Secondary | ICD-10-CM | POA: Diagnosis present

## 2016-04-08 DIAGNOSIS — R000 Tachycardia, unspecified: Secondary | ICD-10-CM | POA: Diagnosis not present

## 2016-04-08 DIAGNOSIS — D122 Benign neoplasm of ascending colon: Secondary | ICD-10-CM | POA: Diagnosis not present

## 2016-04-08 DIAGNOSIS — Z794 Long term (current) use of insulin: Secondary | ICD-10-CM | POA: Diagnosis not present

## 2016-04-08 DIAGNOSIS — I10 Essential (primary) hypertension: Secondary | ICD-10-CM | POA: Diagnosis not present

## 2016-04-08 DIAGNOSIS — D649 Anemia, unspecified: Secondary | ICD-10-CM | POA: Diagnosis not present

## 2016-04-08 DIAGNOSIS — F419 Anxiety disorder, unspecified: Secondary | ICD-10-CM | POA: Diagnosis present

## 2016-04-08 DIAGNOSIS — D72829 Elevated white blood cell count, unspecified: Secondary | ICD-10-CM | POA: Diagnosis not present

## 2016-04-08 DIAGNOSIS — E669 Obesity, unspecified: Secondary | ICD-10-CM | POA: Diagnosis present

## 2016-04-08 DIAGNOSIS — R443 Hallucinations, unspecified: Secondary | ICD-10-CM | POA: Diagnosis not present

## 2016-04-08 DIAGNOSIS — D500 Iron deficiency anemia secondary to blood loss (chronic): Secondary | ICD-10-CM | POA: Diagnosis present

## 2016-04-08 DIAGNOSIS — J9811 Atelectasis: Secondary | ICD-10-CM | POA: Diagnosis not present

## 2016-04-08 DIAGNOSIS — T402X5A Adverse effect of other opioids, initial encounter: Secondary | ICD-10-CM | POA: Diagnosis not present

## 2016-04-08 DIAGNOSIS — E119 Type 2 diabetes mellitus without complications: Secondary | ICD-10-CM

## 2016-04-08 DIAGNOSIS — Z86718 Personal history of other venous thrombosis and embolism: Secondary | ICD-10-CM

## 2016-04-08 DIAGNOSIS — R442 Other hallucinations: Secondary | ICD-10-CM | POA: Diagnosis not present

## 2016-04-08 HISTORY — PX: LAPAROSCOPIC PARTIAL COLECTOMY: SHX5907

## 2016-04-08 HISTORY — DX: Neoplasm of uncertain behavior of colon: D37.4

## 2016-04-08 HISTORY — PX: COLECTOMY: SHX59

## 2016-04-08 LAB — CBC
HEMATOCRIT: 34.3 % — AB (ref 36.0–46.0)
Hemoglobin: 10.2 g/dL — ABNORMAL LOW (ref 12.0–15.0)
MCH: 21.5 pg — ABNORMAL LOW (ref 26.0–34.0)
MCHC: 29.7 g/dL — ABNORMAL LOW (ref 30.0–36.0)
MCV: 72.4 fL — AB (ref 78.0–100.0)
Platelets: 265 10*3/uL (ref 150–400)
RBC: 4.74 MIL/uL (ref 3.87–5.11)
RDW: 17.5 % — ABNORMAL HIGH (ref 11.5–15.5)
WBC: 13.6 10*3/uL — AB (ref 4.0–10.5)

## 2016-04-08 LAB — GLUCOSE, CAPILLARY
GLUCOSE-CAPILLARY: 352 mg/dL — AB (ref 65–99)
GLUCOSE-CAPILLARY: 402 mg/dL — AB (ref 65–99)
GLUCOSE-CAPILLARY: 327 mg/dL — AB (ref 65–99)
GLUCOSE-CAPILLARY: 264 mg/dL — AB (ref 65–99)
GLUCOSE-CAPILLARY: 267 mg/dL — AB (ref 65–99)
GLUCOSE-CAPILLARY: 258 mg/dL — AB (ref 65–99)
GLUCOSE-CAPILLARY: 256 mg/dL — AB (ref 65–99)
GLUCOSE-CAPILLARY: 210 mg/dL — AB (ref 65–99)
Glucose-Capillary: 244 mg/dL — ABNORMAL HIGH (ref 65–99)

## 2016-04-08 LAB — CREATININE, SERUM
Creatinine, Ser: 1.11 mg/dL — ABNORMAL HIGH (ref 0.44–1.00)
GFR calc non Af Amer: 51 mL/min — ABNORMAL LOW (ref >60)
GFR, EST AFRICAN AMERICAN: 59 mL/min — AB (ref >60)

## 2016-04-08 SURGERY — LAPAROSCOPIC PARTIAL COLECTOMY
Anesthesia: General | Site: Abdomen

## 2016-04-08 MED ORDER — MIDAZOLAM HCL 5 MG/5ML IJ SOLN
INTRAMUSCULAR | Status: DC | PRN
  Administered 2016-04-08: 2 mg via INTRAVENOUS

## 2016-04-08 MED ORDER — SCOPOLAMINE 1 MG/3DAYS TD PT72
1.0000 | MEDICATED_PATCH | TRANSDERMAL | Status: DC
  Administered 2016-04-08: 1.5 mg via TRANSDERMAL

## 2016-04-08 MED ORDER — ALVIMOPAN 12 MG PO CAPS
12.0000 mg | ORAL_CAPSULE | Freq: Once | ORAL | Status: AC
  Administered 2016-04-08: 12 mg via ORAL

## 2016-04-08 MED ORDER — SODIUM CHLORIDE 0.9 % IV SOLN
INTRAVENOUS | Status: DC
  Administered 2016-04-08 (×2): via INTRAVENOUS

## 2016-04-08 MED ORDER — LIDOCAINE 2% (20 MG/ML) 5 ML SYRINGE
INTRAMUSCULAR | Status: AC
  Filled 2016-04-08: qty 5

## 2016-04-08 MED ORDER — ROCURONIUM 10MG/ML (10ML) SYRINGE FOR MEDFUSION PUMP - OPTIME
INTRAVENOUS | Status: DC | PRN
  Administered 2016-04-08: 50 mg via INTRAVENOUS
  Administered 2016-04-08: 10 mg via INTRAVENOUS
  Administered 2016-04-08 (×2): 20 mg via INTRAVENOUS

## 2016-04-08 MED ORDER — FLUMAZENIL 0.5 MG/5ML IV SOLN
INTRAVENOUS | Status: AC
  Filled 2016-04-08: qty 5

## 2016-04-08 MED ORDER — FENTANYL CITRATE (PF) 100 MCG/2ML IJ SOLN
INTRAMUSCULAR | Status: AC
  Filled 2016-04-08: qty 2

## 2016-04-08 MED ORDER — SODIUM CHLORIDE 0.9 % IR SOLN
Status: DC | PRN
  Administered 2016-04-08: 1000 mL

## 2016-04-08 MED ORDER — ROCURONIUM BROMIDE 10 MG/ML (PF) SYRINGE
PREFILLED_SYRINGE | INTRAVENOUS | Status: AC
  Filled 2016-04-08: qty 10

## 2016-04-08 MED ORDER — DEXTROSE 5 % IV SOLN
2.0000 g | Freq: Two times a day (BID) | INTRAVENOUS | Status: AC
  Administered 2016-04-08: 2 g via INTRAVENOUS
  Filled 2016-04-08: qty 2

## 2016-04-08 MED ORDER — ENOXAPARIN SODIUM 40 MG/0.4ML ~~LOC~~ SOLN
40.0000 mg | SUBCUTANEOUS | Status: DC
  Administered 2016-04-09 – 2016-04-12 (×4): 40 mg via SUBCUTANEOUS
  Filled 2016-04-08 (×4): qty 0.4

## 2016-04-08 MED ORDER — ONDANSETRON HCL 4 MG/2ML IJ SOLN
4.0000 mg | Freq: Four times a day (QID) | INTRAMUSCULAR | Status: DC | PRN
  Administered 2016-04-09 – 2016-04-10 (×4): 4 mg via INTRAVENOUS
  Filled 2016-04-08 (×4): qty 2

## 2016-04-08 MED ORDER — PROPOFOL 10 MG/ML IV BOLUS
INTRAVENOUS | Status: AC
  Filled 2016-04-08: qty 20

## 2016-04-08 MED ORDER — SCOPOLAMINE 1 MG/3DAYS TD PT72
MEDICATED_PATCH | TRANSDERMAL | Status: AC
  Administered 2016-04-08: 1.5 mg via TRANSDERMAL
  Filled 2016-04-08: qty 1

## 2016-04-08 MED ORDER — CHLORHEXIDINE GLUCONATE 4 % EX LIQD: 60.0000 mL | Freq: Once | CUTANEOUS | Status: DC

## 2016-04-08 MED ORDER — INSULIN GLARGINE 100 UNIT/ML ~~LOC~~ SOLN
10.0000 [IU] | Freq: Every day | SUBCUTANEOUS | Status: DC
  Administered 2016-04-08: 10 [IU] via SUBCUTANEOUS
  Filled 2016-04-08 (×2): qty 0.1

## 2016-04-08 MED ORDER — PROPOFOL 500 MG/50ML IV EMUL
INTRAVENOUS | Status: DC | PRN
  Administered 2016-04-08: 75 ug/kg/min via INTRAVENOUS

## 2016-04-08 MED ORDER — DEXTROSE 50 % IV SOLN
25.0000 mL | INTRAVENOUS | Status: DC | PRN
Start: 2016-04-08 — End: 2016-04-08
  Filled 2016-04-08: qty 50

## 2016-04-08 MED ORDER — OXYCODONE-ACETAMINOPHEN 5-325 MG PO TABS
1.0000 | ORAL_TABLET | ORAL | Status: DC | PRN
  Administered 2016-04-08 – 2016-04-09 (×4): 2 via ORAL
  Administered 2016-04-10: 1 via ORAL
  Filled 2016-04-08 (×3): qty 2
  Filled 2016-04-08: qty 1

## 2016-04-08 MED ORDER — INSULIN ASPART 100 UNIT/ML ~~LOC~~ SOLN
SUBCUTANEOUS | Status: AC
  Administered 2016-04-08: 7 [IU] via SUBCUTANEOUS
  Filled 2016-04-08: qty 7

## 2016-04-08 MED ORDER — ACETAMINOPHEN 10 MG/ML IV SOLN
INTRAVENOUS | Status: AC
  Filled 2016-04-08: qty 100

## 2016-04-08 MED ORDER — HYDROMORPHONE HCL 1 MG/ML IJ SOLN
INTRAMUSCULAR | Status: DC | PRN
  Administered 2016-04-08: 1 mg via INTRAVENOUS

## 2016-04-08 MED ORDER — EPHEDRINE SULFATE 50 MG/ML IJ SOLN
INTRAMUSCULAR | Status: DC | PRN
Start: 2016-04-08 — End: 2016-04-08
  Administered 2016-04-08: 10 mg via INTRAVENOUS

## 2016-04-08 MED ORDER — ONDANSETRON HCL 4 MG/2ML IJ SOLN
INTRAMUSCULAR | Status: DC | PRN
  Administered 2016-04-08: 4 mg via INTRAVENOUS

## 2016-04-08 MED ORDER — HYDROMORPHONE HCL 1 MG/ML IJ SOLN
1.0000 mg | INTRAMUSCULAR | Status: DC | PRN
  Administered 2016-04-08 – 2016-04-10 (×6): 1 mg via INTRAVENOUS
  Filled 2016-04-08 (×6): qty 1

## 2016-04-08 MED ORDER — ONDANSETRON HCL 4 MG/2ML IJ SOLN
INTRAMUSCULAR | Status: AC
  Filled 2016-04-08: qty 2

## 2016-04-08 MED ORDER — INSULIN ASPART 100 UNIT/ML ~~LOC~~ SOLN
0.0000 [IU] | SUBCUTANEOUS | Status: DC
  Administered 2016-04-08 (×2): 7 [IU] via SUBCUTANEOUS
  Administered 2016-04-09: 15 [IU] via SUBCUTANEOUS
  Administered 2016-04-09: 4 [IU] via SUBCUTANEOUS
  Administered 2016-04-09 (×2): 3 [IU] via SUBCUTANEOUS
  Administered 2016-04-09 – 2016-04-10 (×2): 4 [IU] via SUBCUTANEOUS
  Administered 2016-04-10: 11 [IU] via SUBCUTANEOUS
  Administered 2016-04-10: 4 [IU] via SUBCUTANEOUS
  Administered 2016-04-10 (×2): 7 [IU] via SUBCUTANEOUS
  Administered 2016-04-10: 4 [IU] via SUBCUTANEOUS
  Administered 2016-04-11: 3 [IU] via SUBCUTANEOUS
  Administered 2016-04-11 (×2): 4 [IU] via SUBCUTANEOUS
  Administered 2016-04-11 (×2): 11 [IU] via SUBCUTANEOUS
  Administered 2016-04-12: 4 [IU] via SUBCUTANEOUS
  Administered 2016-04-12: 20 [IU] via SUBCUTANEOUS

## 2016-04-08 MED ORDER — MIDAZOLAM HCL 2 MG/2ML IJ SOLN
INTRAMUSCULAR | Status: AC
  Filled 2016-04-08: qty 2

## 2016-04-08 MED ORDER — PROPOFOL 10 MG/ML IV BOLUS
INTRAVENOUS | Status: DC | PRN
  Administered 2016-04-08: 200 mg via INTRAVENOUS

## 2016-04-08 MED ORDER — INSULIN ASPART 100 UNIT/ML ~~LOC~~ SOLN
SUBCUTANEOUS | Status: AC
  Administered 2016-04-08: 10 [IU] via SUBCUTANEOUS
  Filled 2016-04-08: qty 1

## 2016-04-08 MED ORDER — LEVOTHYROXINE SODIUM 137 MCG PO TABS
137.0000 ug | ORAL_TABLET | Freq: Every day | ORAL | Status: DC
  Administered 2016-04-09 – 2016-04-12 (×4): 137 ug via ORAL
  Filled 2016-04-08 (×4): qty 1

## 2016-04-08 MED ORDER — HYDROMORPHONE HCL 1 MG/ML IJ SOLN
INTRAMUSCULAR | Status: AC
  Filled 2016-04-08: qty 1

## 2016-04-08 MED ORDER — 0.9 % SODIUM CHLORIDE (POUR BTL) OPTIME
TOPICAL | Status: DC | PRN
  Administered 2016-04-08 (×5): 1000 mL

## 2016-04-08 MED ORDER — BUPIVACAINE-EPINEPHRINE 0.25% -1:200000 IJ SOLN
INTRAMUSCULAR | Status: DC | PRN
  Administered 2016-04-08: 14 mL

## 2016-04-08 MED ORDER — PROPOFOL 1000 MG/100ML IV EMUL
INTRAVENOUS | Status: AC
  Filled 2016-04-08: qty 200

## 2016-04-08 MED ORDER — DEXTROSE-NACL 5-0.45 % IV SOLN
INTRAVENOUS | Status: DC
Start: 2016-04-08 — End: 2016-04-08

## 2016-04-08 MED ORDER — INSULIN GLARGINE 100 UNIT/ML ~~LOC~~ SOLN
20.0000 [IU] | Freq: Every day | SUBCUTANEOUS | Status: DC
  Filled 2016-04-08: qty 0.2

## 2016-04-08 MED ORDER — AMLODIPINE BESYLATE 5 MG PO TABS
5.0000 mg | ORAL_TABLET | Freq: Every day | ORAL | Status: DC
  Administered 2016-04-09 – 2016-04-12 (×4): 5 mg via ORAL
  Filled 2016-04-08 (×4): qty 1

## 2016-04-08 MED ORDER — PANTOPRAZOLE SODIUM 40 MG PO TBEC
40.0000 mg | DELAYED_RELEASE_TABLET | Freq: Two times a day (BID) | ORAL | Status: DC
  Administered 2016-04-08 – 2016-04-12 (×8): 40 mg via ORAL
  Filled 2016-04-08 (×8): qty 1

## 2016-04-08 MED ORDER — EPHEDRINE 5 MG/ML INJ
INTRAVENOUS | Status: AC
  Filled 2016-04-08: qty 10

## 2016-04-08 MED ORDER — SODIUM CHLORIDE 0.9 % IV SOLN
INTRAVENOUS | Status: DC
  Filled 2016-04-08: qty 2.5

## 2016-04-08 MED ORDER — ALVIMOPAN 12 MG PO CAPS
ORAL_CAPSULE | ORAL | Status: AC
  Administered 2016-04-08: 12 mg via ORAL
  Filled 2016-04-08: qty 1

## 2016-04-08 MED ORDER — PHENYLEPHRINE HCL 10 MG/ML IJ SOLN
INTRAMUSCULAR | Status: DC | PRN
  Administered 2016-04-08: 120 ug via INTRAVENOUS
  Administered 2016-04-08 (×2): 80 ug via INTRAVENOUS
  Administered 2016-04-08: 120 ug via INTRAVENOUS
  Administered 2016-04-08: 80 ug via INTRAVENOUS

## 2016-04-08 MED ORDER — DEXTROSE 5 % IV SOLN
2.0000 g | INTRAVENOUS | Status: AC
  Administered 2016-04-08: 2 g via INTRAVENOUS
  Filled 2016-04-08: qty 2

## 2016-04-08 MED ORDER — PROMETHAZINE HCL 25 MG/ML IJ SOLN: 6.2500 mg | INTRAMUSCULAR | Status: DC | PRN

## 2016-04-08 MED ORDER — INSULIN ASPART 100 UNIT/ML ~~LOC~~ SOLN
10.0000 [IU] | Freq: Once | SUBCUTANEOUS | Status: AC
  Administered 2016-04-08: 10 [IU] via SUBCUTANEOUS

## 2016-04-08 MED ORDER — DIPHENHYDRAMINE HCL 25 MG PO CAPS: 25.0000 mg | ORAL_CAPSULE | Freq: Four times a day (QID) | ORAL | Status: DC | PRN

## 2016-04-08 MED ORDER — IRBESARTAN 300 MG PO TABS
300.0000 mg | ORAL_TABLET | Freq: Every day | ORAL | Status: DC
  Administered 2016-04-09 – 2016-04-12 (×4): 300 mg via ORAL
  Filled 2016-04-08 (×4): qty 1

## 2016-04-08 MED ORDER — INSULIN REGULAR BOLUS VIA INFUSION
0.0000 [IU] | Freq: Three times a day (TID) | INTRAVENOUS | Status: DC
  Filled 2016-04-08: qty 10

## 2016-04-08 MED ORDER — ALVIMOPAN 12 MG PO CAPS
12.0000 mg | ORAL_CAPSULE | Freq: Two times a day (BID) | ORAL | Status: DC
  Administered 2016-04-09 – 2016-04-10 (×3): 12 mg via ORAL
  Filled 2016-04-08 (×3): qty 1

## 2016-04-08 MED ORDER — SUGAMMADEX SODIUM 200 MG/2ML IV SOLN
INTRAVENOUS | Status: AC
  Filled 2016-04-08: qty 4

## 2016-04-08 MED ORDER — POTASSIUM CHLORIDE 2 MEQ/ML IV SOLN
INTRAVENOUS | Status: DC
  Administered 2016-04-08 – 2016-04-10 (×4): via INTRAVENOUS
  Filled 2016-04-08 (×8): qty 1000

## 2016-04-08 MED ORDER — BUPIVACAINE-EPINEPHRINE (PF) 0.25% -1:200000 IJ SOLN
INTRAMUSCULAR | Status: AC
  Filled 2016-04-08: qty 30

## 2016-04-08 MED ORDER — LIDOCAINE HCL (CARDIAC) 20 MG/ML IV SOLN
INTRAVENOUS | Status: DC | PRN
  Administered 2016-04-08: 100 mg via INTRAVENOUS

## 2016-04-08 MED ORDER — SUGAMMADEX SODIUM 200 MG/2ML IV SOLN
INTRAVENOUS | Status: DC | PRN
  Administered 2016-04-08: 400 mg via INTRAVENOUS

## 2016-04-08 MED ORDER — FENTANYL CITRATE (PF) 100 MCG/2ML IJ SOLN
INTRAMUSCULAR | Status: DC | PRN
  Administered 2016-04-08: 50 ug via INTRAVENOUS
  Administered 2016-04-08: 100 ug via INTRAVENOUS
  Administered 2016-04-08: 50 ug via INTRAVENOUS
  Administered 2016-04-08 (×2): 100 ug via INTRAVENOUS

## 2016-04-08 MED ORDER — DIPHENHYDRAMINE HCL 50 MG/ML IJ SOLN
INTRAMUSCULAR | Status: AC
  Filled 2016-04-08: qty 1

## 2016-04-08 MED ORDER — ONDANSETRON HCL 4 MG PO TABS: 4.0000 mg | ORAL_TABLET | Freq: Four times a day (QID) | ORAL | Status: DC | PRN

## 2016-04-08 MED ORDER — FLUTICASONE PROPIONATE 50 MCG/ACT NA SUSP
2.0000 | Freq: Every day | NASAL | Status: DC | PRN
  Filled 2016-04-08: qty 16

## 2016-04-08 MED ORDER — PHENYLEPHRINE HCL 10 MG/ML IJ SOLN
INTRAVENOUS | Status: DC | PRN
  Administered 2016-04-08: 15 ug/min via INTRAVENOUS

## 2016-04-08 MED ORDER — PHENYLEPHRINE 40 MCG/ML (10ML) SYRINGE FOR IV PUSH (FOR BLOOD PRESSURE SUPPORT)
PREFILLED_SYRINGE | INTRAVENOUS | Status: AC
  Filled 2016-04-08: qty 10

## 2016-04-08 MED ORDER — HYDROMORPHONE HCL 1 MG/ML IJ SOLN: 0.2500 mg | INTRAMUSCULAR | Status: DC | PRN

## 2016-04-08 MED ORDER — OXYCODONE-ACETAMINOPHEN 5-325 MG PO TABS
ORAL_TABLET | ORAL | Status: AC
  Administered 2016-04-08: 2 via ORAL
  Filled 2016-04-08: qty 2

## 2016-04-08 MED ORDER — METOCLOPRAMIDE HCL 5 MG/ML IJ SOLN
INTRAMUSCULAR | Status: DC | PRN
  Administered 2016-04-08: 10 mg via INTRAVENOUS

## 2016-04-08 MED ORDER — DULOXETINE HCL 60 MG PO CPEP
60.0000 mg | ORAL_CAPSULE | Freq: Every day | ORAL | Status: DC
  Administered 2016-04-09 – 2016-04-12 (×4): 60 mg via ORAL
  Filled 2016-04-08 (×4): qty 1

## 2016-04-08 MED ORDER — METOCLOPRAMIDE HCL 5 MG/ML IJ SOLN
INTRAMUSCULAR | Status: AC
  Filled 2016-04-08: qty 2

## 2016-04-08 MED ORDER — SODIUM CHLORIDE 0.9 % IV SOLN
INTRAVENOUS | Status: DC | PRN
  Administered 2016-04-08: 1.5 [IU]/h via INTRAVENOUS

## 2016-04-08 MED ORDER — LACTATED RINGERS IV SOLN
INTRAVENOUS | Status: DC
  Administered 2016-04-08: 10:00:00 via INTRAVENOUS

## 2016-04-08 MED ORDER — DIPHENHYDRAMINE HCL 50 MG/ML IJ SOLN
INTRAMUSCULAR | Status: DC | PRN
Start: 2016-04-08 — End: 2016-04-08
  Administered 2016-04-08 (×2): 12.5 mg via INTRAVENOUS

## 2016-04-08 SURGICAL SUPPLY — 72 items
APPLIER CLIP ROT 10 11.4 M/L (STAPLE)
BLADE SURG 10 STRL SS (BLADE) ×2 IMPLANT
BLADE SURG ROTATE 9660 (MISCELLANEOUS) ×2 IMPLANT
CANISTER SUCTION 2500CC (MISCELLANEOUS) ×2 IMPLANT
CELLS DAT CNTRL 66122 CELL SVR (MISCELLANEOUS) ×1 IMPLANT
CHLORAPREP W/TINT 26ML (MISCELLANEOUS) ×2 IMPLANT
CLIP APPLIE ROT 10 11.4 M/L (STAPLE) IMPLANT
COVER SURGICAL LIGHT HANDLE (MISCELLANEOUS) ×4 IMPLANT
DRAPE PROXIMA HALF (DRAPES) ×2 IMPLANT
DRAPE UTILITY XL STRL (DRAPES) ×2 IMPLANT
DRAPE WARM FLUID 44X44 (DRAPE) ×2 IMPLANT
DRSG OPSITE POSTOP 4X8 (GAUZE/BANDAGES/DRESSINGS) ×2 IMPLANT
DRSG TEGADERM 2-3/8X2-3/4 SM (GAUZE/BANDAGES/DRESSINGS) ×8 IMPLANT
DRSG TELFA 3X8 NADH (GAUZE/BANDAGES/DRESSINGS) ×2 IMPLANT
ELECT BLADE 6.5 EXT (BLADE) IMPLANT
ELECT CAUTERY BLADE 6.4 (BLADE) ×4 IMPLANT
ELECT REM PT RETURN 9FT ADLT (ELECTROSURGICAL) ×2
ELECTRODE REM PT RTRN 9FT ADLT (ELECTROSURGICAL) ×1 IMPLANT
GAUZE SPONGE 2X2 8PLY STRL LF (GAUZE/BANDAGES/DRESSINGS) ×1 IMPLANT
GLOVE BIO SURGEON STRL SZ7 (GLOVE) ×2 IMPLANT
GLOVE BIO SURGEON STRL SZ8 (GLOVE) ×4 IMPLANT
GLOVE BIOGEL PI IND STRL 6.5 (GLOVE) ×4 IMPLANT
GLOVE BIOGEL PI IND STRL 7.0 (GLOVE) ×2 IMPLANT
GLOVE BIOGEL PI IND STRL 8.5 (GLOVE) ×2 IMPLANT
GLOVE BIOGEL PI INDICATOR 6.5 (GLOVE) ×4
GLOVE BIOGEL PI INDICATOR 7.0 (GLOVE) ×2
GLOVE BIOGEL PI INDICATOR 8.5 (GLOVE) ×2
GLOVE ECLIPSE 7.0 STRL STRAW (GLOVE) ×4 IMPLANT
GLOVE EUDERMIC 7 POWDERFREE (GLOVE) ×4 IMPLANT
GLOVE SURG SS PI 7.0 STRL IVOR (GLOVE) ×2 IMPLANT
GOWN STRL REUS W/ TWL LRG LVL3 (GOWN DISPOSABLE) ×7 IMPLANT
GOWN STRL REUS W/ TWL XL LVL3 (GOWN DISPOSABLE) ×4 IMPLANT
GOWN STRL REUS W/TWL LRG LVL3 (GOWN DISPOSABLE) ×7
GOWN STRL REUS W/TWL XL LVL3 (GOWN DISPOSABLE) ×4
HANDLE SUCTION POOLE (INSTRUMENTS) ×1 IMPLANT
KIT BASIN OR (CUSTOM PROCEDURE TRAY) ×2 IMPLANT
LIGASURE IMPACT 36 18CM CVD LR (INSTRUMENTS) ×2 IMPLANT
MARKER SKIN DUAL TIP RULER LAB (MISCELLANEOUS) ×2 IMPLANT
NS IRRIG 1000ML POUR BTL (IV SOLUTION) ×4 IMPLANT
PAD ARMBOARD 7.5X6 YLW CONV (MISCELLANEOUS) ×4 IMPLANT
PENCIL BUTTON HOLSTER BLD 10FT (ELECTRODE) ×4 IMPLANT
RELOAD PROXIMATE 75MM BLUE (ENDOMECHANICALS) ×4 IMPLANT
RTRCTR WOUND ALEXIS 18CM MED (MISCELLANEOUS) ×2
SCALPEL HARMONIC ACE (MISCELLANEOUS) ×2 IMPLANT
SCISSORS LAP 5X35 DISP (ENDOMECHANICALS) ×2 IMPLANT
SET IRRIG TUBING LAPAROSCOPIC (IRRIGATION / IRRIGATOR) ×2 IMPLANT
SLEEVE ENDOPATH XCEL 5M (ENDOMECHANICALS) ×6 IMPLANT
SPECIMEN JAR LARGE (MISCELLANEOUS) ×2 IMPLANT
SPONGE GAUZE 2X2 STER 10/PKG (GAUZE/BANDAGES/DRESSINGS) ×1
SPONGE LAP 18X18 X RAY DECT (DISPOSABLE) ×2 IMPLANT
STAPLER GUN LINEAR PROX 60 (STAPLE) ×2 IMPLANT
STAPLER PROXIMATE 75MM BLUE (STAPLE) ×2 IMPLANT
STAPLER VISISTAT 35W (STAPLE) ×2 IMPLANT
SUCTION POOLE HANDLE (INSTRUMENTS) ×2
SUCTION POOLE TIP (SUCTIONS) ×2 IMPLANT
SUT NOVA 1 T20/GS 25DT (SUTURE) ×6 IMPLANT
SUT PDS AB 1 TP1 96 (SUTURE) ×4 IMPLANT
SUT SILK 2 0 (SUTURE) ×1
SUT SILK 2 0 SH CR/8 (SUTURE) ×2 IMPLANT
SUT SILK 2-0 18XBRD TIE 12 (SUTURE) ×1 IMPLANT
SUT SILK 3 0 (SUTURE) ×1
SUT SILK 3 0 SH CR/8 (SUTURE) ×2 IMPLANT
SUT SILK 3-0 18XBRD TIE 12 (SUTURE) ×1 IMPLANT
SYR BULB IRRIGATION 50ML (SYRINGE) ×4 IMPLANT
TOWEL OR 17X26 10 PK STRL BLUE (TOWEL DISPOSABLE) ×4 IMPLANT
TRAY FOLEY CATH 16FRSI W/METER (SET/KITS/TRAYS/PACK) ×2 IMPLANT
TRAY LAPAROSCOPIC MC (CUSTOM PROCEDURE TRAY) ×2 IMPLANT
TROCAR XCEL 12X100 BLDLESS (ENDOMECHANICALS) ×2 IMPLANT
TROCAR XCEL NON-BLD 5MMX100MML (ENDOMECHANICALS) ×2 IMPLANT
TUBE CONNECTING 12X1/4 (SUCTIONS) ×4 IMPLANT
TUBING FILTER THERMOFLATOR (ELECTROSURGICAL) ×2 IMPLANT
YANKAUER SUCT BULB TIP NO VENT (SUCTIONS) ×4 IMPLANT

## 2016-04-08 NOTE — Interval H&P Note (Signed)
History and Physical Interval Note:  04/08/2016 8:40 AM  [name]  has presented today for surgery, with the diagnosis of villous adenoma of cecum  polyp right colon  The various methods of treatment have been discussed with the patient and family. After consideration of risks, benefits and other options for treatment, the patient has consented to  Procedure(s): LAPAROSCOPIC ASSISTED ASCENDING COLECTOMY POSSIBLE OPEN COLECTOMY (N/A) as a surgical intervention .  The patient's history has been reviewed, patient examined, no change in status, stable for surgery.  I have reviewed the patient's chart and labs.  Questions were answered to the patient's satisfaction.     Adin Hector

## 2016-04-08 NOTE — Anesthesia Procedure Notes (Signed)
Procedure Name: Intubation Date/Time: 04/08/2016 10:03 AM Performed by: Huey Romans ANN Pre-anesthesia Checklist: Patient identified, Emergency Drugs available, Suction available and Patient being monitored Patient Re-evaluated:Patient Re-evaluated prior to inductionOxygen Delivery Method: Circle System Utilized Preoxygenation: Pre-oxygenation with 100% oxygen Intubation Type: IV induction Ventilation: Mask ventilation without difficulty Laryngoscope Size: Miller and 2 Grade View: Grade I Tube type: Oral Tube size: 7.0 mm Number of attempts: 1 Airway Equipment and Method: Stylet and Oral airway Placement Confirmation: ETT inserted through vocal cords under direct vision,  positive ETCO2 and breath sounds checked- equal and bilateral Secured at: 22 cm Tube secured with: Tape Dental Injury: Teeth and Oropharynx as per pre-operative assessment  Comments: Chips noted to front incisors preop.  Mucosa and dentition intact after DL as pre-op.

## 2016-04-08 NOTE — Anesthesia Postprocedure Evaluation (Signed)
Anesthesia Post Note  Patient: [name]  Procedure(s) Performed: Procedure(s) (LRB): LAPAROSCOPIC ASSISTED ASCENDING COLECTOMY POSSIBLE OPEN COLECTOMY (N/A)  Patient location during evaluation: PACU Anesthesia Type: General Level of consciousness: awake and alert Pain management: pain level controlled Vital Signs Assessment: post-procedure vital signs reviewed and stable Respiratory status: spontaneous breathing, nonlabored ventilation, respiratory function stable and patient connected to nasal cannula oxygen Cardiovascular status: blood pressure returned to baseline and stable Postop Assessment: no signs of nausea or vomiting Anesthetic complications: no    Last Vitals:  Vitals:   04/08/16 0829 04/08/16 1344  BP: (!) 169/67 135/63  Pulse: (!) 104 95  Resp: 18 14  Temp: 37.1 C 36.4 C    Last Pain:  Vitals:   04/08/16 0829  TempSrc: Oral  PainSc: 6                  Analeya Luallen DAVID

## 2016-04-08 NOTE — Transfer of Care (Signed)
Immediate Anesthesia Transfer of Care Note  Patient: [name]  Procedure(s) Performed: Procedure(s): LAPAROSCOPIC ASSISTED ASCENDING COLECTOMY POSSIBLE OPEN COLECTOMY (N/A)  Patient Location: PACU  Anesthesia Type:General  Level of Consciousness: sedated  Airway & Oxygen Therapy: Patient Spontanous Breathing and Patient connected to face mask  Post-op Assessment: Report given to RN and Post -op Vital signs reviewed and stable  Post vital signs: Reviewed and stable  Last Vitals:  Vitals:   04/08/16 0829 04/08/16 1344  BP: (!) 169/67 135/63  Pulse: (!) 104 95  Resp: 18 14  Temp: 37.1 C 36.4 C    Last Pain:  Vitals:   04/08/16 0829  TempSrc: Oral  PainSc: 6       Patients Stated Pain Goal: 4 ([id number])  Complications: No apparent anesthesia complications

## 2016-04-08 NOTE — Anesthesia Preprocedure Evaluation (Addendum)
Anesthesia Evaluation  Patient identified by MRN, date of birth, ID band Patient awake    Reviewed: Allergy & Precautions, NPO status , Patient's Chart, lab work & pertinent test results  History of Anesthesia Complications (+) PONV  Airway Mallampati: II  TM Distance: >3 FB Neck ROM: Full    Dental  (+) Teeth Intact, Dental Advisory Given   Pulmonary shortness of breath,    breath sounds clear to auscultation (-) decreased breath sounds      Cardiovascular hypertension,  Rhythm:Regular Rate:Normal     Neuro/Psych    GI/Hepatic hiatal hernia, GERD  ,  Endo/Other  diabetesHypothyroidism   Renal/GU Renal InsufficiencyRenal disease     Musculoskeletal  (+) Arthritis ,   Abdominal (+) + obese,   Peds  Hematology  (+) Sickle cell trait ,   Anesthesia Other Findings   Reproductive/Obstetrics                            Anesthesia Physical Anesthesia Plan  ASA: III  Anesthesia Plan: General   Post-op Pain Management:    Induction: Intravenous  Airway Management Planned: Oral ETT  Additional Equipment:   Intra-op Plan:   Post-operative Plan: Extubation in OR  Informed Consent: I have reviewed the patients History and Physical, chart, labs and discussed the procedure including the risks, benefits and alternatives for the proposed anesthesia with the patient or authorized representative who has indicated his/her understanding and acceptance.   Dental advisory given  Plan Discussed with: CRNA  Anesthesia Plan Comments:         Anesthesia Quick Evaluation

## 2016-04-08 NOTE — Consult Note (Signed)
[facility] Medical Consultation  [name] [id number] DOB: 01-25-1951 DOA: 04/08/2016 PCP: Vidal Schwalbe, MD   Requesting physician: Fanny Skates Date of consultation: 04/08/16 Reason for consultation: diabetes and htn  Impression/Recommendations Principal Problem:   Type II diabetes mellitus ([place]) Active Problems:   Hypertension   Blood loss anemia   Anemia   Villous adenoma of right colon   Leukocytosis  1. Type 2 diabetes uncontrolled. Home medications include 70 3070 units twice a day. Patient denies difficulty with controlling and reports compliance with diet and meds.. Dr. Buddy Duty endocrinologists -We will provide Lantus 10 units as currently appetite unreliable -Sliding scale insulin every 4 hours -Obtain a hemoglobin A1c -Monitor  2. Hypertension. Blood pressure on the high end of normal. Home medications include Norvasc, Lasix, valsartan. -Hold Lasix -We'll resume Norvasc and valsartan -Monitor closely  #3. Anemia. History of same related to acute blood loss. Hemoglobin 10.2. Close to baseline. Signs symptoms of active bleeding -Monitor  #4.Adenoma of the right colon. Per surgery. -pain control  #5. Leukocytosis: mild. Likely reactive. She is afebrile and hemodynamically stable.  -monitor  TRH followup again tomorrow. Please contact can be of assistance in the meanwhile. Thank you for this consultation.  Chief Complaint: diabetes and htn  HPI:  Pt 65 yo hx dm,htn, obesity, diastolic dysfunction, gerd, dvt anemia recently had colonoscopy showing a 3 cm exophytic mass around the ileocecal valve. Biopsy showed villous adenoma with high-grade dysplasia. underwent laparoscopic assisted ascending colectomy today. Comorbidities include insulin-dependent diabetes poorly controlled Hypertension. Hyperlipidemia. CKD stage III. Chronic GERD.  Consult requested for DM and HTN management. Reportedly BS 402 this am. She was placed on insulin gtts and  improved to 250. Reports compliance with insulin. In addition BP 172/83. She had not taken home meds as she was npo. She denies chest pain palpitation shortness of breath headache dizziness syncope or near-syncope. She denies any dysuria hematuria frequency or urgency. She denies cough fever chills recent travel or sick contacts.    Review of Systems:  10 point review of systems complete and all systems are negative except as indicated in the history of present illness  Past Medical History:  Diagnosis Date  . Anemia    iron deficiency  . Arthritis   . Blood transfusion   . Depression   . Diabetes mellitus   . Diastolic dysfunction   . Diverticulitis   . DJD (degenerative joint disease)   . DVT (deep venous thrombosis) ([facility])    times 2 lower leg  . Endometriosis   . Family history of adverse reaction to anesthesia    mom and dad PONV  . Fibromyalgia   . GERD (gastroesophageal reflux disease)    occ  . Hiatal hernia   . Hyperlipidemia   . Hypertension   . Hypothyroidism   . Insomnia   . Kidney stones   . Mild aortic insufficiency    by echo 04/2013  . Osteoarthritis    back and knee  . Ovarian cyst   . PONV (postoperative nausea and vomiting)   . RLS (restless legs syndrome)   . Thoracic compression fracture ([place])   . Uterine fibroid   . UTI (lower urinary tract infection)   . Villous adenoma of right colon 04/08/2016  . Vitamin D deficiency disease    Past Surgical History:  Procedure Laterality Date  . APPENDECTOMY    . BACK SURGERY  ,2005   April  2013 - [facility] fusion[facility]  . BREAST SURGERY  breast reduction  . CARDIAC CATHETERIZATION  04/2013   normal coronary arteries and normal LVF  . CARPAL TUNNEL RELEASE  06/05/2012   Procedure: CARPAL TUNNEL RELEASE;  Surgeon: Cammie Sickle., MD;  Location: [facility];  Service: Orthopedics;  Laterality: Left;  . CESAREAN SECTION     x 2  . CHOLECYSTECTOMY    . COLECTOMY  04/08/2016  . DORSAL  COMPARTMENT RELEASE  06/05/2012   Procedure: RELEASE DORSAL COMPARTMENT (DEQUERVAIN);  Surgeon: Cammie Sickle., MD;  Location: [facility];  Service: Orthopedics;  Laterality: Left;  Excision of mixoid cyst also  . EYE SURGERY     cataracts bilateral  . KNEE ARTHROPLASTY  09   lft partial  . KNEE ARTHROPLASTY     rt  . orthopedic surgeries     multiple  . SHOULDER OPEN ROTATOR CUFF REPAIR     rt and lft  . TUBAL LIGATION     btsp   Social History:  reports that she has never smoked. She has never used smokeless tobacco. She reports that she does not drink alcohol or use drugs.  Allergies  Allergen Reactions  . Ambien [Zolpidem Tartrate] Other (See Comments)    Up sleep walking and eating  . Crestor [Rosuvastatin] Other (See Comments)    Muscle weakness, cramps and aching all over body  . Lipitor [Atorvastatin] Itching and Other (See Comments)    Muscle weakness, cramps and aches all over body  . Morphine And Related Other (See Comments)    Flushing and feeling hot  . Penicillins Rash and Other (See Comments)    Caused Headaches also Has patient had a PCN reaction causing immediate rash, facial/tongue/throat swelling, SOB or lightheadedness with hypotension: No Has patient had a PCN reaction causing severe rash involving mucus membranes or skin necrosis: No Has patient had a PCN reaction that required hospitalization No Has patient had a PCN reaction occurring within the last 10 years: No If all of the above answers are "NO", then may proceed with Cephalosporin use.    . Statins Other (See Comments)    Muscle weakness, cramps and aching all over body  . Amitriptyline Other (See Comments)    Loopy feeling  . Ceftin [Cefuroxime Axetil] Other (See Comments)    Stomach pain   . Codeine Nausea And Vomiting  . Lovaza [Omega-3-Acid Ethyl Esters] Other (See Comments)    Stomach pain   . Lunesta [Eszopiclone] Other (See Comments)    Causes dizziness  . Lyrica  [Pregabalin] Nausea Only  . Metformin And Related Nausea Only   Family History  Problem Relation Age of Onset  . Heart attack Father     21s  . Hypothyroidism Father   . Diabetes type II Father   . Diabetes type II Mother   . Hypertension Mother   . Breast cancer Mother 52  . Hypothyroidism Brother     Prior to Admission medications   Medication Sig Start Date End Date Taking? Authorizing Provider  acetaminophen (TYLENOL) 500 MG tablet Take 1,000 mg by mouth every 6 (six) hours as needed for mild pain, moderate pain, fever or headache.    Yes Historical Provider, MD  amLODipine (NORVASC) 5 MG tablet Take 5 mg by mouth daily.   Yes Historical Provider, MD  aspirin 81 MG chewable tablet Chew 81 mg by mouth daily.   Yes Historical Provider, MD  diphenhydrAMINE (BENADRYL) 25 mg capsule Take 25-50 mg by mouth every 6 (  six) hours as needed for itching.   Yes Historical Provider, MD  DULoxetine (CYMBALTA) 60 MG capsule Take 60 mg by mouth daily.   Yes Historical Provider, MD  fluticasone (FLONASE) 50 MCG/ACT nasal spray Place 2 sprays into both nostrils daily as needed for allergies or rhinitis.   Yes Historical Provider, MD  furosemide (LASIX) 20 MG tablet Take 20-40 mg by mouth daily as needed for fluid.   Yes Historical Provider, MD  HYDROcodone-acetaminophen (NORCO/VICODIN) 5-325 MG per tablet Take 1 tablet by mouth every 4 (four) hours as needed for moderate pain or severe pain. 10/29/14  Yes Antonietta Breach, PA-C  ibuprofen (ADVIL,MOTRIN) 800 MG tablet Take 800 mg by mouth every 8 (eight) hours as needed for headache or moderate pain.   Yes Historical Provider, MD  insulin NPH-regular Human (NOVOLIN 70/30) (70-30) 100 UNIT/ML injection Inject 70 Units into the skin 2 (two) times daily with a meal.    Yes Historical Provider, MD  levothyroxine (SYNTHROID, LEVOTHROID) 137 MCG tablet Take 137 mcg by mouth daily before breakfast.   Yes Historical Provider, MD  metroNIDAZOLE (METROGEL) 0.75 % gel  Apply 1 application topically daily.   Yes Historical Provider, MD  pantoprazole (PROTONIX) 40 MG tablet Take 40 mg by mouth 2 (two) times daily.   Yes Historical Provider, MD  promethazine (PHENERGAN) 25 MG tablet Take 25 mg by mouth every 6 (six) hours as needed for nausea or vomiting.    Yes Historical Provider, MD  temazepam (RESTORIL) 30 MG capsule Take 30 mg by mouth at bedtime.    Yes Historical Provider, MD  valsartan (DIOVAN) 320 MG tablet Take 320 mg by mouth daily.   Yes Historical Provider, MD  ciprofloxacin (CIPRO) 500 MG tablet Take 1 tablet (500 mg total) by mouth 2 (two) times daily. Patient not taking: Reported on 01/15/2016 12/11/15   Lacretia Leigh, MD  diazepam (VALIUM) 2 MG tablet Take 1 tablet (2 mg total) by mouth every 4 (four) hours as needed for muscle spasms. Patient not taking: Reported on 01/15/2016 12/11/15   Lacretia Leigh, MD  Vitamin D, Ergocalciferol, (DRISDOL) 50000 UNITS CAPS capsule TAKE ONE CAPSULE BY MOUTH EVERY OTHER WEEK Patient not taking: Reported on 10/28/2014 04/22/13   Regina Eck, CNM   Physical Exam: Blood pressure (!) 144/61, pulse 98, temperature 97.6 F (36.4 C), temperature source Oral, resp. rate 20, height 5' 2.5" (1.588 m), weight 96 kg (211 lb 10.3 oz), SpO2 100 %. Vitals:   04/08/16 1530 04/08/16 1602  BP: (!) 141/66 (!) 144/61  Pulse: 98 98  Resp: (!) 22 20  Temp: 98 F (36.7 C) 97.6 F (36.4 C)     General:  Lying in bed slightly pale somewhat lethargic in no acute distress  Eyes: Pupils equal round reactive to light no scleral icterus EOMI  ENT: Mucous membranes of her mouth are slightly pale very dry oropharynx without erythema or exudate  Neck: Full range of motion no JVD no lymphadenopathy  Cardiovascular: Regular rate and rhythm no murmur gallop or rub no lower extremity edema  Respiratory: Normal effort but slightly shallow breath sounds slightly distant to hear no wheeze no rhonchi no crackles  Abdomen: Obese sluggish  bowel sounds diffuse tenderness to palpation dressing dry and intact  Skin: No erythema rash  Musculoskeletal: joints without swelling/erythema range of motion  Psychiatric: Calm cooperative  Neurologic: Lethargic oriented 3 speech is slow and deliberate but clear  Labs on Admission:  Basic Metabolic Panel:  Recent Labs  Lab 04/08/16 1627  CREATININE 1.11*   Liver Function Tests: No results for input(s): AST, ALT, ALKPHOS, BILITOT, PROT, ALBUMIN in the last 168 hours. No results for input(s): LIPASE, AMYLASE in the last 168 hours. No results for input(s): AMMONIA in the last 168 hours. CBC:  Recent Labs Lab 04/08/16 1627  WBC 13.6*  HGB 10.2*  HCT 34.3*  MCV 72.4*  PLT 265   Cardiac Enzymes: No results for input(s): CKTOTAL, CKMB, CKMBINDEX, TROPONINI in the last 168 hours. BNP: Invalid input(s): POCBNP CBG:  Recent Labs Lab 04/08/16 1204 04/08/16 1254 04/08/16 1350 04/08/16 1444 04/08/16 1447  GLUCAP 264* 267* 258* 256* 244*    Radiological Exams on Admission: No results found.  EKG:   Time spent: 45 minutes  [facility]  If 7PM-7AM, please contact night-coverage [email or web address] Password [facility] 04/08/2016, 5:37 PM

## 2016-04-08 NOTE — Op Note (Addendum)
Patient Name:           [name]   Date of Surgery:        04/08/2016  Pre op Diagnosis:      Villous adenoma of cecum with high-grade dysplasia  Post op Diagnosis:    Villous adenoma of cecum with high-grade dysplasia                                       Extensive intra-abdominal adhesions  Procedure:                 Laparoscopic lysis of adhesions requiring 60 minutes                                      Laparoscopic ascending colectomy  Surgeon:                    Edsel Petrin. Dalbert Batman, M.D., FACS  Assistant:                    Jens Som, M.D.  Operative Indications:   This is a 65 year old Caucasian female from [place], [place]. She is referred by Dr. Laurence Spates for management of a villous adenoma with high-grade dysplasia of the cecum.  Dr. Harlan Stains is her PCP. Dr. Delrae Rend is her endocrinologist.     She was initially found to have iron deficiency anemia. Hemoglobin 9.8. Repeat 10.0. She's had anemia in the past. Last upper endoscopy and colonoscopy 2009 were normal.      Recent colonoscopy shows a 3 cm exophytic mass around the ileocecal valve. Biopsy showed villous adenoma with high-grade dysplasia.  Symptomatically she's had some epigastric discomfort which sounds like chronic GERD. No weight loss. No nausea or vomiting. She has a bowel movement every 3-4 days which is normal for her. Good appetite. Has seen any blood in her stool. Stools are dark. No vaginal bleeding.       CT scan abdomen and pelvis shows no obvious lesion of the colon, with specific attention to the right colon. There is a 9 mm low-density right hepatic lobe lesion, indeterminate. Further evaluation with dedicated pre-and post contrast abdominal MRI was suggested. I called Mrs. Beveridge and discussed this with her. I told her there was no indication for biopsy or further imaging at this time. We will proceed with her right colon resection. If this shows a benign villous adenoma  then we will repeat her CT scan in 6 months. If this shows a colon cancer then we will proceed with MRI, referral to oncology, and possible PET scan. She understands all of this.      Comorbidities include insulin-dependent diabetes. She takes insulin twice a day. Her blood sugars were poorly controlled around the time of her colonoscopy.. Fibromyalgia. Hypertension. Hyperlipidemia. CKD stage III. Chronic GERD. History of uterine fibroids. History laparoscopic appendectomy by Dr. Hassell Done. History open cholecystectomy by Dr. Rosana Hoes for gangrenous gallbladder according to her. She's had 2 C-sections through lower midline incision. One was urgent. She has a BMI of 37..    I described the anatomy of her colonic neoplasm. I told her it was precancerous. Told her that it is possible there could be invasive cancer present at this time we just don't know. I advised her to undergo elective laparoscopic-assisted  right colectomy and she agrees. I discussed the indications, details, techniques, and numerous risk of the surgery with her and her husband. They're aware of the risk of bleeding, infection, conversion to open laparotomy, wound healing problems such as hernia or dehiscence, injury to adjacent organs requiring major reconstructive surgery, cardiac pulmonary and thromboembolic problems. She knows that she may have adhesions, minimal or significant that will possibly slow the surgery down, and possibly could require converting to open if they're bad. She accepts all of this. She understands all these issues well. At this time all questions were answered. She agrees with this plan.   She underwent a mechanical and antibiotic bowel prep at home yesterday.  Her blood sugar this morning was 400.  We will bring it down to less than 250 with insulin drip. Marland Kitchen  Operative Findings:       She had moderate omental adhesions in the lower abdomen.  In the upper abdomen she had extensive complex chronic  adhesions which took over 1 hour to take down.  Ultimately we had good visualization of the C-loop of the duodenum with no injury.  Ultimately we could mobilize the entire right colon and right transverse colon.  When we remove the specimen we can feel a soft polypoid mass just above the ileocecal valve.  The pathologist looked at the specimen and identified the polyp as described by colonoscopy.  There was no adenopathy or signs of cancer anywhere else in the abdomen.  Procedure in Detail:          Following the induction of general endotracheal anesthesia a Foley catheter was placed.  Oral gastric tube was placed.  The abdomen was prepped and draped in a sterile fashion.  Intravenous antibiotics were given.  Surgical timeout was performed.  0.5% Marcaine with epinephrine was used as a local infiltration anesthetic.     An 11 mm Hassan trocar was placed in the midline just above the umbilicus with an open technique.  We entered the abdomen under direct vision.  There were no adhesions around the umbilicus.  A Hassan cannula was secured with the Purstring suture of Vicryl and a pneumoperitoneum was created.  Video camera was inserted.  We placed 5 mm trochars in the right midabdomen, left mid abdomen, lower midline, and upper midline.      We spent at least 1 hour taking down all the adhesions with harmonic scalpel, sharp dissection, blunt dissection.  We ultimately identified all the pertinent anatomy.  We mobilized the terminal ileum right colon, hepatic flexure, and transverse colon mostly dissecting with a harmonic scalpel.  After we had full mobility we converted to an extraction site in the upper midline.  This incision was made with the knife.  Fascia divided with cautery.  Wound protector placed.  The terminal ileum right colon and transverse colon were exteriorized through this wound.  We transected the terminal ileum with a GIA stapling device.  We transected the transverse colon with a GIA stapling  device to the right of the middle colic vessels.  Smaller mesenteric vessels were taken down with the LigaSure device.  Larger right colic vessels were clamped divided and ligated with 2-0 silk suture ligatures.  The specimen was removed and sent to pathology.  The specimen was examined by pathology and they identified the polyp as described on colonoscopy.  We can also feel this polyp.     Anastomosis was created between the terminal ileum and the mid transverse colon with a GIA  stapling device.  Both the proximal and distal limbs of the anastomosis were pink with excellent vascularity.  Anastomosis was without tension.  There was no bleeding.  The common defect was closed with the TA 60 stapling device on a blue load.  A few silk sutures were placed to reinforce the staple line at critical points.  The staple line appeared to be airtight and watertight.  Great care was taken not to twist the mesentery.  We irrigated the abdomen and pelvis with 2 L of saline.  The did not appear to be any bleeding.     At this point we changed our gowns gloves and instruments and redraped the patient according to our protocol.  We irrigated one more time and saw no bleeding.  The midline fascia was closed with interrupted #1 Novafil sutures.  We reinsufflated and looked around.  There was no active bleeding.  We evacuated  the remaining irrigation fluid.  We did a 4 quadrant inspection and everything looked fine.  We irrigated the midline wound.  We released the pneumoperitoneum and removed all of the rest of the trocars.  The skin incisions were closed with skin staples.  Between skin staples of the midline wound we placed Telfa wicks.  Protocol bandages were placed and the patient taken to recovery in stable condition.  The patient tolerated the procedure well.  EBL 125 mL or less.  Counts correct.  Complications none.     Edsel Petrin. Dalbert Batman, M.D., FACS General and Minimally Invasive Surgery Breast and Colorectal  Surgery  04/08/2016 1:15 PM

## 2016-04-08 NOTE — Anesthesia Postprocedure Evaluation (Signed)
Anesthesia Post Note  Patient: [name]  Procedure(s) Performed: Procedure(s) (LRB): LAPAROSCOPIC ASSISTED ASCENDING COLECTOMY POSSIBLE OPEN COLECTOMY (N/A)  Patient location during evaluation: PACU Anesthesia Type: General Level of consciousness: awake and alert Pain management: pain level controlled Vital Signs Assessment: post-procedure vital signs reviewed and stable Respiratory status: spontaneous breathing, nonlabored ventilation, respiratory function stable and patient connected to nasal cannula oxygen Cardiovascular status: blood pressure returned to baseline and stable Postop Assessment: no signs of nausea or vomiting Anesthetic complications: no    Last Vitals:  Vitals:   04/08/16 0829 04/08/16 1344  BP: (!) 169/67 135/63  Pulse: (!) 104 95  Resp: 18 14  Temp: 37.1 C 36.4 C    Last Pain:  Vitals:   04/08/16 0829  TempSrc: Oral  PainSc: 6                  Tambra Muller DAVID

## 2016-04-09 DIAGNOSIS — E1100 Type 2 diabetes mellitus with hyperosmolarity without nonketotic hyperglycemic-hyperosmolar coma (NKHHC): Secondary | ICD-10-CM

## 2016-04-09 DIAGNOSIS — D500 Iron deficiency anemia secondary to blood loss (chronic): Secondary | ICD-10-CM

## 2016-04-09 DIAGNOSIS — D72829 Elevated white blood cell count, unspecified: Secondary | ICD-10-CM

## 2016-04-09 LAB — BASIC METABOLIC PANEL
Anion gap: 5 (ref 5–15)
BUN: 8 mg/dL (ref 6–20)
CALCIUM: 8.6 mg/dL — AB (ref 8.9–10.3)
CO2: 25 mmol/L (ref 22–32)
CREATININE: 0.95 mg/dL (ref 0.44–1.00)
Chloride: 107 mmol/L (ref 101–111)
GFR calc Af Amer: >60 mL/min (ref >60)
GLUCOSE: 150 mg/dL — AB (ref 65–99)
Potassium: 4.1 mmol/L (ref 3.5–5.1)
Sodium: 137 mmol/L (ref 135–145)

## 2016-04-09 LAB — GLUCOSE, CAPILLARY
GLUCOSE-CAPILLARY: 138 mg/dL — AB (ref 65–99)
Glucose-Capillary: 110 mg/dL — ABNORMAL HIGH (ref 65–99)
Glucose-Capillary: 124 mg/dL — ABNORMAL HIGH (ref 65–99)
Glucose-Capillary: 178 mg/dL — ABNORMAL HIGH (ref 65–99)
Glucose-Capillary: 183 mg/dL — ABNORMAL HIGH (ref 65–99)
Glucose-Capillary: 305 mg/dL — ABNORMAL HIGH (ref 65–99)
Glucose-Capillary: 86 mg/dL (ref 65–99)

## 2016-04-09 LAB — CBC
HCT: 31.3 % — ABNORMAL LOW (ref 36.0–46.0)
Hemoglobin: 9 g/dL — ABNORMAL LOW (ref 12.0–15.0)
MCH: 20.9 pg — AB (ref 26.0–34.0)
MCHC: 28.8 g/dL — AB (ref 30.0–36.0)
MCV: 72.8 fL — ABNORMAL LOW (ref 78.0–100.0)
PLATELETS: 248 10*3/uL (ref 150–400)
RBC: 4.3 MIL/uL (ref 3.87–5.11)
RDW: 17.8 % — AB (ref 11.5–15.5)
WBC: 9.1 10*3/uL (ref 4.0–10.5)

## 2016-04-09 LAB — TSH: TSH: 0.491 u[IU]/mL (ref 0.350–4.500)

## 2016-04-09 LAB — HEMOGLOBIN A1C
HEMOGLOBIN A1C: 8.2 % — AB (ref 4.8–5.6)
MEAN PLASMA GLUCOSE: 189 mg/dL

## 2016-04-09 MED ORDER — INSULIN ASPART PROT & ASPART (70-30 MIX) 100 UNIT/ML ~~LOC~~ SUSP
30.0000 [IU] | Freq: Two times a day (BID) | SUBCUTANEOUS | Status: DC
  Administered 2016-04-09 – 2016-04-10 (×2): 30 [IU] via SUBCUTANEOUS
  Filled 2016-04-09: qty 10

## 2016-04-09 MED ORDER — INSULIN ASPART PROT & ASPART (70-30 MIX) 100 UNIT/ML ~~LOC~~ SUSP
15.0000 [IU] | Freq: Once | SUBCUTANEOUS | Status: AC
  Administered 2016-04-09: 15 [IU] via SUBCUTANEOUS
  Filled 2016-04-09: qty 10

## 2016-04-09 MED ORDER — HYDRALAZINE HCL 20 MG/ML IJ SOLN: 5.0000 mg | INTRAMUSCULAR | Status: DC | PRN

## 2016-04-09 NOTE — Progress Notes (Signed)
MD notified in reference to elevated temperature, IS advised.

## 2016-04-09 NOTE — Progress Notes (Addendum)
1 Day Post-Op  Subjective: Stable and alert.  Feels pretty good, considering No nausea.  Pain well controlled.  No dyspnea.  Good urine output. Operative findings explained to patient. Normotensive.  Afebrile. CBGs coming down.  178 at midnight.  138 at 4 AM.  Received a little Lantus insulin yesterday and now just on sliding scale. [facility] following and there medical advice is greatly appreciated.  Objective: Vital signs in last 24 hours: Temp:  [97.5 F (36.4 C)-99.4 F (37.4 C)] 99.4 F (37.4 C) (09/02 0543) Pulse Rate:  [95-113] 103 (09/02 0543) Resp:  [14-22] 18 (09/02 0543) BP: (119-169)/(54-67) 125/54 (09/02 0543) SpO2:  [96 %-100 %] 96 % (09/02 0543) Weight:  [96 kg (211 lb 10.3 oz)] 96 kg (211 lb 10.3 oz) (09/01 1602)    Intake/Output from previous day: 09/01 0701 - 09/02 0700 In: 4344.6 [P.O.:120; I.V.:4224.6] Out: 2725 [Urine:2425; Blood:250] Intake/Output this shift: Total I/O In: 1814.6 [I.V.:1814.6] Out: 1000 [Urine:1000]  General appearance: Alert.  No distress.  Mental status normal. Resp: clear to auscultation bilaterally GI: Soft.  Appropriately tender.  Hypoactive bowel sounds.  Not distended.  Wounds clean.  Lab Results:  Results for orders placed or performed during the hospital encounter of 04/08/16 (from the past 24 hour(s))  Glucose, capillary     Status: Abnormal   Collection Time: 04/08/16  8:34 AM  Result Value Ref Range   Glucose-Capillary 402 (H) 65 - 99 mg/dL  Glucose, capillary     Status: Abnormal   Collection Time: 04/08/16  9:14 AM  Result Value Ref Range   Glucose-Capillary 352 (H) 65 - 99 mg/dL  Glucose, capillary     Status: Abnormal   Collection Time: 04/08/16 10:50 AM  Result Value Ref Range   Glucose-Capillary 327 (H) 65 - 99 mg/dL  Glucose, capillary     Status: Abnormal   Collection Time: 04/08/16 12:04 PM  Result Value Ref Range   Glucose-Capillary 264 (H) 65 - 99 mg/dL  Glucose, capillary     Status: Abnormal    Collection Time: 04/08/16 12:54 PM  Result Value Ref Range   Glucose-Capillary 267 (H) 65 - 99 mg/dL  Glucose, capillary     Status: Abnormal   Collection Time: 04/08/16  1:50 PM  Result Value Ref Range   Glucose-Capillary 258 (H) 65 - 99 mg/dL  Glucose, capillary     Status: Abnormal   Collection Time: 04/08/16  2:44 PM  Result Value Ref Range   Glucose-Capillary 256 (H) 65 - 99 mg/dL   Comment 1 Notify RN    Comment 2 Document in Chart   Glucose, capillary     Status: Abnormal   Collection Time: 04/08/16  2:47 PM  Result Value Ref Range   Glucose-Capillary 244 (H) 65 - 99 mg/dL   Comment 1 Notify RN    Comment 2 Document in Chart   CBC     Status: Abnormal   Collection Time: 04/08/16  4:27 PM  Result Value Ref Range   WBC 13.6 (H) 4.0 - 10.5 K/uL   RBC 4.74 3.87 - 5.11 MIL/uL   Hemoglobin 10.2 (L) 12.0 - 15.0 g/dL   HCT 34.3 (L) 36.0 - 46.0 %   MCV 72.4 (L) 78.0 - 100.0 fL   MCH 21.5 (L) 26.0 - 34.0 pg   MCHC 29.7 (L) 30.0 - 36.0 g/dL   RDW 17.5 (H) 11.5 - 15.5 %   Platelets 265 150 - 400 K/uL  Creatinine, serum  Status: Abnormal   Collection Time: 04/08/16  4:27 PM  Result Value Ref Range   Creatinine, Ser 1.11 (H) 0.44 - 1.00 mg/dL   GFR calc non Af Amer 51 (L) >60 mL/min   GFR calc Af Amer 59 (L) >60 mL/min  Hemoglobin A1c     Status: Abnormal   Collection Time: 04/08/16  4:27 PM  Result Value Ref Range   Hgb A1c MFr Bld 8.2 (H) 4.8 - 5.6 %   Mean Plasma Glucose 189 mg/dL  Glucose, capillary     Status: Abnormal   Collection Time: 04/08/16  8:33 PM  Result Value Ref Range   Glucose-Capillary 210 (H) 65 - 99 mg/dL  Glucose, capillary     Status: Abnormal   Collection Time: 04/09/16 12:17 AM  Result Value Ref Range   Glucose-Capillary 178 (H) 65 - 99 mg/dL   Comment 1 Notify RN   Basic metabolic panel     Status: Abnormal   Collection Time: 04/09/16  3:03 AM  Result Value Ref Range   Sodium 137 135 - 145 mmol/L   Potassium 4.1 3.5 - 5.1 mmol/L    Chloride 107 101 - 111 mmol/L   CO2 25 22 - 32 mmol/L   Glucose, Bld 150 (H) 65 - 99 mg/dL   BUN 8 6 - 20 mg/dL   Creatinine, Ser 0.95 0.44 - 1.00 mg/dL   Calcium 8.6 (L) 8.9 - 10.3 mg/dL   GFR calc non Af Amer >60 >60 mL/min   GFR calc Af Amer >60 >60 mL/min   Anion gap 5 5 - 15  CBC     Status: Abnormal   Collection Time: 04/09/16  3:03 AM  Result Value Ref Range   WBC 9.1 4.0 - 10.5 K/uL   RBC 4.30 3.87 - 5.11 MIL/uL   Hemoglobin 9.0 (L) 12.0 - 15.0 g/dL   HCT 31.3 (L) 36.0 - 46.0 %   MCV 72.8 (L) 78.0 - 100.0 fL   MCH 20.9 (L) 26.0 - 34.0 pg   MCHC 28.8 (L) 30.0 - 36.0 g/dL   RDW 17.8 (H) 11.5 - 15.5 %   Platelets 248 150 - 400 K/uL  TSH     Status: None   Collection Time: 04/09/16  3:03 AM  Result Value Ref Range   TSH 0.491 0.350 - 4.500 uIU/mL  Glucose, capillary     Status: Abnormal   Collection Time: 04/09/16  4:04 AM  Result Value Ref Range   Glucose-Capillary 138 (H) 65 - 99 mg/dL     Studies/Results: No results found.  Marland Kitchen alvimopan  12 mg Oral BID  . amLODipine  5 mg Oral Daily  . DULoxetine  60 mg Oral Daily  . enoxaparin (LOVENOX) injection  40 mg Subcutaneous Q24H  . insulin aspart  0-20 Units Subcutaneous Q4H  . insulin glargine  10 Units Subcutaneous QHS  . irbesartan  300 mg Oral Daily  . levothyroxine  137 mcg Oral QAC breakfast  . pantoprazole  40 mg Oral BID     Assessment/Plan: s/p Procedure(s): LAPAROSCOPIC ASSISTED ASCENDING COLECTOMY POSSIBLE OPEN COLECTOMY  Villous adenoma with high-grade dysplasia of cecum  POD #1.  Laparoscopic lysis of adhesions, extensive, laparoscopic right colectomy.     Stable.     Clear liquid diet     Mobilize and ambulate     Incentive spirometry encouraged.     Foley out tomorrow, sooner if she becomes fully ambulatory     Decrease IV rate  Check pathology   Insulin-dependent diabetes mellitus.    Continue sliding scale insulin every 4 hours.  Basal insulin as needed  Hypertension.     Controlled.  Continue current oral medications.  VTE prophylaxis.    Lovenox    Appreciate [facility] intervention and monitoring.    Continue  @PROBHOSP @  LOS: 1 day    Isidro Monks M 04/09/2016  . .prob

## 2016-04-09 NOTE — Progress Notes (Signed)
[facility] PROGRESS NOTE  AKARA NEEDLES [id number] DOB: 05/23/1951 DOA: 04/08/2016   PCP: Vidal Schwalbe, MD     Assessment/Plan: Principal Problem:   Type II diabetes mellitus ([place]) Active Problems:   Hypertension   Blood loss anemia   Anemia   Villous adenoma of right colon   Leukocytosis   65 yo hx dm,htn, obesity, diastolic dysfunction, gerd, dvt anemia recently had colonoscopy showing a 3 cm exophytic mass around the ileocecal valve. Biopsy showed villous adenoma with high-grade dysplasia. underwent laparoscopic assisted ascending colectomy today. Comorbidities include insulin-dependent diabetes poorly controlled Hypertension. Hyperlipidemia. CKD stage III. Chronic GERD.  Consult requested for DM and HTN management.  Assessment and plan 1. Type 2 diabetes uncontrolled. Home medications include 70 /30 , 70 units twice a day. Will restart 70/30 at 30 units twice a day. Discontinue Lantus Followed by Dr. Buddy Duty for diabetes Continue-Sliding scale insulin every 4 hours Hemoglobin A1c 8.2    2. Hypertension. Blood pressure improved. Home medications include Norvasc, Lasix, valsartan. -Hold Lasix -Continue Norvasc and valsartan    #3. Anemia. History of same related to acute blood loss. Hemoglobin 10.2>9.0. Close to baseline. Signs symptoms of active bleeding    #4.Adenoma of the right colon. Per surgery. -pain control  #5. Leukocytosis: mild. Likely reactive. She is afebrile and hemodynamically stable.  -monitor    DVT prophylaxsis as per surgery  Code Status:  Full code     Family Communication: Discussed in detail with the patient, all imaging results, lab results explained to the patient   Disposition Plan:  As per surgery       Consultants:  [facility]  Procedures:  None  Antibiotics: Anti-infectives    Start     Dose/Rate Route Frequency Ordered Stop   04/08/16 2200  cefoTEtan (CEFOTAN) 2 g in dextrose 5 %  50 mL IVPB     2 g 100 mL/hr over 30 Minutes Intravenous Every 12 hours 04/08/16 1510 04/08/16 2205   04/08/16 1015  cefoTEtan (CEFOTAN) 2 g in dextrose 5 % 50 mL IVPB     2 g 100 mL/hr over 30 Minutes Intravenous To Surgery 04/08/16 1012 04/08/16 1023         HPI/Subjective: CBG better this morning,No nausea.  Pain well controlled.  No dyspnea.  Good urine output  Objective: Vitals:   04/08/16 1757 04/08/16 2130 04/09/16 0218 04/09/16 0543  BP: 137/62 (!) 141/63 (!) 119/55 (!) 125/54  Pulse: (!) 102 (!) 113 (!) 105 (!) 103  Resp: 20 19 19 18   Temp: 98 F (36.7 C) 97.7 F (36.5 C) 98.6 F (37 C) 99.4 F (37.4 C)  TempSrc: Oral Oral Oral Oral  SpO2: 98% 96% 98% 96%  Weight:      Height:        Intake/Output Summary (Last 24 hours) at 04/09/16 0915 Last data filed at 04/09/16 0600  Gross per 24 hour  Intake          4344.58 ml  Output             2725 ml  Net          1619.58 ml    Exam:  Examination:  General exam: Appears calm and comfortable  Respiratory system: Clear to auscultation. Respiratory effort normal. Cardiovascular system: S1 & S2 heard, RRR. No JVD, murmurs, rubs, gallops or clicks. No pedal edema. Gastrointestinal system: Abdomen is nondistended, soft and nontender. No organomegaly or masses felt. Normal  bowel sounds heard. Central nervous system: Alert and oriented. No focal neurological deficits. Extremities: Symmetric 5 x 5 power. Skin: No rashes, lesions or ulcers Psychiatry: Judgement and insight appear normal. Mood & affect appropriate.     Data Reviewed: I have personally reviewed following labs and imaging studies  Micro Results No results found for this or any previous visit (from the past 240 hour(s)).  Radiology Reports Ct Abdomen Pelvis W Contrast  Result Date: 03/28/2016 CLINICAL DATA:  Neoplasm of the ascending colon found on colonoscopy 03/03/2016. Diabetes. Hypertension. EXAM: CT ABDOMEN AND PELVIS WITH CONTRAST TECHNIQUE:  Multidetector CT imaging of the abdomen and pelvis was performed using the standard protocol following bolus administration of intravenous contrast. CONTRAST:  17mL ISOVUE-300 IOPAMIDOL (ISOVUE-300) INJECTION 61% Creatinine was obtained on site at [facility] at [address] Results: Creatinine 0.9 mg/dL. COMPARISON:  06/25/2012 FINDINGS: Lower chest: Clear lung bases. Normal heart size without pericardial or pleural effusion. Hepatobiliary: Inferior right hepatic lobe 9 mm hypo attenuating lesion image 38/series 3 and coronal image 60 is not identified on the prior from 2013. Cholecystectomy, without biliary ductal dilatation. Pancreas: Pancreatic atrophy, without duct dilatation. Spleen: Normal in size, without focal abnormality. Adrenals/Urinary Tract: Normal adrenal glands. Normal kidneys, without hydronephrosis. Normal urinary bladder. Stomach/Bowel: Normal stomach, without wall thickening. Scattered colonic diverticula. No dominant colonic mass identified. Normal terminal ileum. Appendectomy. Proximal transverse duodenal diverticulum. Small bowel otherwise unremarkable. Vascular/Lymphatic: Aortic and branch vessel atherosclerosis. No abdominopelvic adenopathy. Reproductive: Normal uterus and adnexa. Other: No significant free fluid. No evidence of omental or peritoneal disease. Musculoskeletal: Trans pedicle screw fixation at L3-4. IMPRESSION: 1. No ascending colonic primary is identified. 2. 9 mm low-density right hepatic lobe lesion is not readily apparent on the prior exam. This is indeterminate. Cannot exclude isolated hepatic metastasis. Consider further evaluation with dedicated pre and post contrast abdominal MRI (ideally with Eovist). 3.  Aortic atherosclerosis. Electronically Signed   By: Abigail Miyamoto M.D.   On: 03/28/2016 14:56     CBC  Recent Labs Lab 04/08/16 1627 04/09/16 0303  WBC 13.6* 9.1  HGB 10.2* 9.0*  HCT 34.3* 31.3*  PLT 265 248  MCV 72.4* 72.8*  MCH 21.5*  20.9*  MCHC 29.7* 28.8*  RDW 17.5* 17.8*    Chemistries   Recent Labs Lab 04/08/16 1627 04/09/16 0303  NA  --  137  K  --  4.1  CL  --  107  CO2  --  25  GLUCOSE  --  150*  BUN  --  8  CREATININE 1.11* 0.95  CALCIUM  --  8.6*   ------------------------------------------------------------------------------------------------------------------ estimated creatinine clearance is 64.5 mL/min (by C-G formula based on SCr of 0.95 mg/dL). ------------------------------------------------------------------------------------------------------------------  Recent Labs  04/08/16 1627  HGBA1C 8.2*   ------------------------------------------------------------------------------------------------------------------ No results for input(s): CHOL, HDL, LDLCALC, TRIG, CHOLHDL, LDLDIRECT in the last 72 hours. ------------------------------------------------------------------------------------------------------------------  Recent Labs  04/09/16 0303  TSH 0.491   ------------------------------------------------------------------------------------------------------------------ No results for input(s): VITAMINB12, FOLATE, FERRITIN, TIBC, IRON, RETICCTPCT in the last 72 hours.  Coagulation profile No results for input(s): INR, PROTIME in the last 168 hours.  No results for input(s): DDIMER in the last 72 hours.  Cardiac Enzymes No results for input(s): CKMB, TROPONINI, MYOGLOBIN in the last 168 hours.  Invalid input(s): CK ------------------------------------------------------------------------------------------------------------------ Invalid input(s): POCBNP   CBG:  Recent Labs Lab 04/08/16 1447 04/08/16 2033 04/09/16 0017 04/09/16 0404 04/09/16 0715  GLUCAP 244* 210* 178* 138* 183*       Studies:  No results found.    Lab Results  Component Value Date   HGBA1C 8.2 (H) 04/08/2016   HGBA1C 8.7 (H) 04/01/2016   HGBA1C 10.5 (H) 03/01/2013   Lab Results  Component  Value Date   LDLCALC 108 (H) 05/02/2013   CREATININE 0.95 04/09/2016       Scheduled Meds: . alvimopan  12 mg Oral BID  . amLODipine  5 mg Oral Daily  . DULoxetine  60 mg Oral Daily  . enoxaparin (LOVENOX) injection  40 mg Subcutaneous Q24H  . insulin aspart  0-20 Units Subcutaneous Q4H  . insulin aspart protamine- aspart  30 Units Subcutaneous BID WC  . irbesartan  300 mg Oral Daily  . levothyroxine  137 mcg Oral QAC breakfast  . pantoprazole  40 mg Oral BID   Continuous Infusions: . lactated ringers with kcl 75 mL/hr at 04/09/16 0711     LOS: 1 day    Time spent: >30 MINS    [facility]  [facility] Pager [phone]. If 7PM-7AM, please contact night-coverage at [email or web address], password [facility] 04/09/2016, 9:15 AM  LOS: 1 day

## 2016-04-10 DIAGNOSIS — I10 Essential (primary) hypertension: Secondary | ICD-10-CM

## 2016-04-10 LAB — COMPREHENSIVE METABOLIC PANEL
ALBUMIN: 2.9 g/dL — AB (ref 3.5–5.0)
ALT: 15 U/L (ref 14–54)
ANION GAP: 7 (ref 5–15)
AST: 17 U/L (ref 15–41)
Alkaline Phosphatase: 73 U/L (ref 38–126)
BILIRUBIN TOTAL: 0.6 mg/dL (ref 0.3–1.2)
BUN: 6 mg/dL (ref 6–20)
CO2: 24 mmol/L (ref 22–32)
Calcium: 8.4 mg/dL — ABNORMAL LOW (ref 8.9–10.3)
Chloride: 103 mmol/L (ref 101–111)
Creatinine, Ser: 1.09 mg/dL — ABNORMAL HIGH (ref 0.44–1.00)
GFR calc Af Amer: >60 mL/min (ref >60)
GFR calc non Af Amer: 52 mL/min — ABNORMAL LOW (ref >60)
GLUCOSE: 170 mg/dL — AB (ref 65–99)
POTASSIUM: 4 mmol/L (ref 3.5–5.1)
SODIUM: 134 mmol/L — AB (ref 135–145)
TOTAL PROTEIN: 5.5 g/dL — AB (ref 6.5–8.1)

## 2016-04-10 LAB — GLUCOSE, CAPILLARY
GLUCOSE-CAPILLARY: 172 mg/dL — AB (ref 65–99)
GLUCOSE-CAPILLARY: 177 mg/dL — AB (ref 65–99)
GLUCOSE-CAPILLARY: 197 mg/dL — AB (ref 65–99)
GLUCOSE-CAPILLARY: 212 mg/dL — AB (ref 65–99)
GLUCOSE-CAPILLARY: 250 mg/dL — AB (ref 65–99)
Glucose-Capillary: 261 mg/dL — ABNORMAL HIGH (ref 65–99)

## 2016-04-10 LAB — CBC
HEMATOCRIT: 29.3 % — AB (ref 36.0–46.0)
HEMOGLOBIN: 8.3 g/dL — AB (ref 12.0–15.0)
MCH: 20.9 pg — AB (ref 26.0–34.0)
MCHC: 28.3 g/dL — AB (ref 30.0–36.0)
MCV: 73.8 fL — AB (ref 78.0–100.0)
Platelets: 237 10*3/uL (ref 150–400)
RBC: 3.97 MIL/uL (ref 3.87–5.11)
RDW: 17.7 % — AB (ref 11.5–15.5)
WBC: 8.7 10*3/uL (ref 4.0–10.5)

## 2016-04-10 MED ORDER — BISACODYL 10 MG RE SUPP
10.0000 mg | Freq: Two times a day (BID) | RECTAL | Status: AC
  Administered 2016-04-10: 10 mg via RECTAL
  Filled 2016-04-10 (×2): qty 1

## 2016-04-10 MED ORDER — HYDROMORPHONE HCL 1 MG/ML IJ SOLN
0.5000 mg | INTRAMUSCULAR | Status: DC | PRN
  Administered 2016-04-10: 0.5 mg via INTRAVENOUS
  Filled 2016-04-10 (×2): qty 1

## 2016-04-10 MED ORDER — INSULIN ASPART PROT & ASPART (70-30 MIX) 100 UNIT/ML ~~LOC~~ SUSP
20.0000 [IU] | Freq: Two times a day (BID) | SUBCUTANEOUS | Status: DC
  Administered 2016-04-11: 20 [IU] via SUBCUTANEOUS
  Filled 2016-04-10: qty 10

## 2016-04-10 NOTE — Progress Notes (Signed)
[facility] PROGRESS NOTE  [name] [id number] DOB: [birth year] DOA: 04/08/2016   PCP: Vidal Schwalbe, MD     Assessment/Plan: Principal Problem:   Type II diabetes mellitus ([place]) Active Problems:   Hypertension   Blood loss anemia   Anemia   Villous adenoma of right colon   Leukocytosis   65 yo hx dm,htn, obesity, diastolic dysfunction, gerd, dvt anemia recently had colonoscopy showing a 3 cm exophytic mass around the ileocecal valve. Biopsy showed villous adenoma with high-grade dysplasia. underwent laparoscopic assisted ascending colectomy today. Comorbidities include insulin-dependent diabetes poorly controlled Hypertension. Hyperlipidemia. CKD stage III. Chronic GERD.  Consult requested for DM and HTN management.  Assessment and plan 1. Type 2 diabetes uncontrolled. Home medications include 70 /30 , 70 units twice a day.  Continue 70/30 at 20 units twice a day. Discontinue Lantus Followed by Dr. Buddy Duty for diabetes Continue-Sliding scale insulin every 4 hours Hemoglobin A1c 8.2    2. Hypertension. Blood pressure improved. Home medications include Norvasc, Lasix, valsartan. -Hold Lasix -Continue Norvasc and valsartan    #3. Anemia. History of same related to acute blood loss. Hemoglobin 10.2>8.3. Close to baseline. Signs symptoms of active bleeding    #4.Adenoma of the right colon. Per surgery. -pain control  #5. Leukocytosis: mild. Likely reactive. Febrile last night. Started on incentive spirometry. Surgery managing  -monitor    DVT prophylaxsis as per surgery  Code Status:  Full code     Family Communication: Discussed in detail with the patient, all imaging results, lab results explained to the patient   Disposition Plan:  As per surgery       Consultants:  [facility]  Procedures:  None  Antibiotics: Anti-infectives    Start     Dose/Rate Route Frequency Ordered Stop   04/08/16 2200  cefoTEtan  (CEFOTAN) 2 g in dextrose 5 % 50 mL IVPB     2 g 100 mL/hr over 30 Minutes Intravenous Every 12 hours 04/08/16 1510 04/08/16 2205   04/08/16 1015  cefoTEtan (CEFOTAN) 2 g in dextrose 5 % 50 mL IVPB     2 g 100 mL/hr over 30 Minutes Intravenous To Surgery 04/08/16 1012 04/08/16 1023         HPI/Subjective: CBG better this morning,No nausea.  Pain well controlled.  No dyspnea.  Good urine output  Objective: Vitals:   04/09/16 1334 04/09/16 2232 04/10/16 0025 04/10/16 0606  BP: (!) 114/54 (!) 137/58  (!) 149/64  Pulse: (!) 103 (!) 114  (!) 114  Resp: 18 17  17   Temp: 98.9 F (37.2 C) (!) 101.8 F (38.8 C) 98 F (36.7 C) 99.9 F (37.7 C)  TempSrc: Oral Oral Oral Oral  SpO2: 96% 92%  93%  Weight:      Height:        Intake/Output Summary (Last 24 hours) at 04/10/16 [id number] Last data filed at 04/10/16 0600  Gross per 24 hour  Intake             3117 ml  Output             1400 ml  Net             1717 ml    Exam:  Examination:  General exam: Appears calm and comfortable  Respiratory system: Clear to auscultation. Respiratory effort normal. Cardiovascular system: S1 & S2 heard, RRR. No JVD, murmurs, rubs, gallops or clicks. No pedal edema. Gastrointestinal system: Abdomen is nondistended,  soft and nontender. No organomegaly or masses felt. Normal bowel sounds heard. Central nervous system: Alert and oriented. No focal neurological deficits. Extremities: Symmetric 5 x 5 power. Skin: No rashes, lesions or ulcers Psychiatry: Judgement and insight appear normal. Mood & affect appropriate.     Data Reviewed: I have personally reviewed following labs and imaging studies  Micro Results No results found for this or any previous visit (from the past 240 hour(s)).  Radiology Reports Ct Abdomen Pelvis W Contrast  Result Date: 03/28/2016 CLINICAL DATA:  Neoplasm of the ascending colon found on colonoscopy 03/03/2016. Diabetes. Hypertension. EXAM: CT ABDOMEN AND PELVIS WITH  CONTRAST TECHNIQUE: Multidetector CT imaging of the abdomen and pelvis was performed using the standard protocol following bolus administration of intravenous contrast. CONTRAST:  175mL ISOVUE-300 IOPAMIDOL (ISOVUE-300) INJECTION 61% Creatinine was obtained on site at [place] at [address] Results: Creatinine 0.9 mg/dL. COMPARISON:  06/25/2012 FINDINGS: Lower chest: Clear lung bases. Normal heart size without pericardial or pleural effusion. Hepatobiliary: Inferior right hepatic lobe 9 mm hypo attenuating lesion image 38/series 3 and coronal image 60 is not identified on the prior from 2013. Cholecystectomy, without biliary ductal dilatation. Pancreas: Pancreatic atrophy, without duct dilatation. Spleen: Normal in size, without focal abnormality. Adrenals/Urinary Tract: Normal adrenal glands. Normal kidneys, without hydronephrosis. Normal urinary bladder. Stomach/Bowel: Normal stomach, without wall thickening. Scattered colonic diverticula. No dominant colonic mass identified. Normal terminal ileum. Appendectomy. Proximal transverse duodenal diverticulum. Small bowel otherwise unremarkable. Vascular/Lymphatic: Aortic and branch vessel atherosclerosis. No abdominopelvic adenopathy. Reproductive: Normal uterus and adnexa. Other: No significant free fluid. No evidence of omental or peritoneal disease. Musculoskeletal: Trans pedicle screw fixation at L3-4. IMPRESSION: 1. No ascending colonic primary is identified. 2. 9 mm low-density right hepatic lobe lesion is not readily apparent on the prior exam. This is indeterminate. Cannot exclude isolated hepatic metastasis. Consider further evaluation with dedicated pre and post contrast abdominal MRI (ideally with Eovist). 3.  Aortic atherosclerosis. Electronically Signed   By: Abigail Miyamoto M.D.   On: 03/28/2016 14:56     CBC  Recent Labs Lab 04/08/16 1627 04/09/16 0303 04/10/16 0112  WBC 13.6* 9.1 8.7  HGB 10.2* 9.0* 8.3*  HCT 34.3* 31.3*  29.3*  PLT 265 248 237  MCV 72.4* 72.8* 73.8*  MCH 21.5* 20.9* 20.9*  MCHC 29.7* 28.8* 28.3*  RDW 17.5* 17.8* 17.7*    Chemistries   Recent Labs Lab 04/08/16 1627 04/09/16 0303 04/10/16 0112  NA  --  137 134*  K  --  4.1 4.0  CL  --  107 103  CO2  --  25 24  GLUCOSE  --  150* 170*  BUN  --  8 6  CREATININE 1.11* 0.95 1.09*  CALCIUM  --  8.6* 8.4*  AST  --   --  17  ALT  --   --  15  ALKPHOS  --   --  73  BILITOT  --   --  0.6   ------------------------------------------------------------------------------------------------------------------ estimated creatinine clearance is 56.2 mL/min (by C-G formula based on SCr of 1.09 mg/dL). ------------------------------------------------------------------------------------------------------------------  Recent Labs  04/08/16 1627  HGBA1C 8.2*   ------------------------------------------------------------------------------------------------------------------ No results for input(s): CHOL, HDL, LDLCALC, TRIG, CHOLHDL, LDLDIRECT in the last 72 hours. ------------------------------------------------------------------------------------------------------------------  Recent Labs  04/09/16 0303  TSH 0.491   ------------------------------------------------------------------------------------------------------------------ No results for input(s): VITAMINB12, FOLATE, FERRITIN, TIBC, IRON, RETICCTPCT in the last 72 hours.  Coagulation profile No results for input(s): INR, PROTIME in the last 168 hours.  No results for input(s): DDIMER in the last 72 hours.  Cardiac Enzymes No results for input(s): CKMB, TROPONINI, MYOGLOBIN in the last 168 hours.  Invalid input(s): CK ------------------------------------------------------------------------------------------------------------------ Invalid input(s): POCBNP   CBG:  Recent Labs Lab 04/09/16 1731 04/09/16 2007 04/10/16 0009 04/10/16 0409 04/10/16 0745  GLUCAP 110* 124*  177* 172* 197*       Studies: No results found.    Lab Results  Component Value Date   HGBA1C 8.2 (H) 04/08/2016   HGBA1C 8.7 (H) 04/01/2016   HGBA1C 10.5 (H) 03/01/2013   Lab Results  Component Value Date   LDLCALC 108 (H) 05/02/2013   CREATININE 1.09 (H) 04/10/2016       Scheduled Meds: . alvimopan  12 mg Oral BID  . amLODipine  5 mg Oral Daily  . bisacodyl  10 mg Rectal BID  . DULoxetine  60 mg Oral Daily  . enoxaparin (LOVENOX) injection  40 mg Subcutaneous Q24H  . insulin aspart  0-20 Units Subcutaneous Q4H  . insulin aspart protamine- aspart  20 Units Subcutaneous BID WC  . irbesartan  300 mg Oral Daily  . levothyroxine  137 mcg Oral QAC breakfast  . pantoprazole  40 mg Oral BID   Continuous Infusions: . lactated ringers with kcl 75 mL/hr at 04/10/16 0600     LOS: 2 days    Time spent: >30 MINS    [facility]  [facility] Pager [phone]. If 7PM-7AM, please contact night-coverage at [email or web address], password [facility] 04/10/2016, 9:39 AM  LOS: 2 days

## 2016-04-10 NOTE — Progress Notes (Signed)
2 Days Post-Op  Subjective: Alert.  States she feels okay this morning.  Pain seems to be well controlled.  Getting up to chair. Had some nausea and vomited a tiny amount yesterday.  Sipping clear liquids Foley removed this morning Transient fever to 101.8.  Heart rate 110.  BP 149/64. WBC 8700, hemoglobin 8.3.  Potassium 4.0.  Creatinine 1.09. Glucose ranges 124-197.     Objective: Vital signs in last 24 hours: Temp:  [98 F (36.7 C)-101.8 F (38.8 C)] 99.9 F (37.7 C) (09/03 0606) Pulse Rate:  [103-114] 114 (09/03 0606) Resp:  [17-18] 17 (09/03 0606) BP: (114-149)/(54-64) 149/64 (09/03 0606) SpO2:  [92 %-96 %] 93 % (09/03 0606) Last BM Date: 04/07/16  Intake/Output from previous day: 09/02 0701 - 09/03 0700 In: 3117 [P.O.:1540; I.V.:1577] Out: 1400 [Urine:1400] Intake/Output this shift: No intake/output data recorded.    EXAM: General appearance: Alert.  Cooperative.  Pleasant.  In no distress.  Skin warm and dry. Resp: clear to auscultation bilaterally GI: Abdomen soft.  Minimal distention.  Hypoactive bowel sounds.  Wounds look good.  Appropriate incisional tenderness.  Lab Results:  Results for orders placed or performed during the hospital encounter of 04/08/16 (from the past 24 hour(s))  Glucose, capillary     Status: Abnormal   Collection Time: 04/09/16 11:10 AM  Result Value Ref Range   Glucose-Capillary 305 (H) 65 - 99 mg/dL  Glucose, capillary     Status: None   Collection Time: 04/09/16  3:27 PM  Result Value Ref Range   Glucose-Capillary 86 65 - 99 mg/dL   Comment 1 Notify RN    Comment 2 Document in Chart   Glucose, capillary     Status: Abnormal   Collection Time: 04/09/16  5:31 PM  Result Value Ref Range   Glucose-Capillary 110 (H) 65 - 99 mg/dL   Comment 1 Notify RN    Comment 2 Document in Chart   Glucose, capillary     Status: Abnormal   Collection Time: 04/09/16  8:07 PM  Result Value Ref Range   Glucose-Capillary 124 (H) 65 - 99 mg/dL   Glucose, capillary     Status: Abnormal   Collection Time: 04/10/16 12:09 AM  Result Value Ref Range   Glucose-Capillary 177 (H) 65 - 99 mg/dL  Comprehensive metabolic panel     Status: Abnormal   Collection Time: 04/10/16  1:12 AM  Result Value Ref Range   Sodium 134 (L) 135 - 145 mmol/L   Potassium 4.0 3.5 - 5.1 mmol/L   Chloride 103 101 - 111 mmol/L   CO2 24 22 - 32 mmol/L   Glucose, Bld 170 (H) 65 - 99 mg/dL   BUN 6 6 - 20 mg/dL   Creatinine, Ser 1.09 (H) 0.44 - 1.00 mg/dL   Calcium 8.4 (L) 8.9 - 10.3 mg/dL   Total Protein 5.5 (L) 6.5 - 8.1 g/dL   Albumin 2.9 (L) 3.5 - 5.0 g/dL   AST 17 15 - 41 U/L   ALT 15 14 - 54 U/L   Alkaline Phosphatase 73 38 - 126 U/L   Total Bilirubin 0.6 0.3 - 1.2 mg/dL   GFR calc non Af Amer 52 (L) >60 mL/min   GFR calc Af Amer >60 >60 mL/min   Anion gap 7 5 - 15  CBC     Status: Abnormal   Collection Time: 04/10/16  1:12 AM  Result Value Ref Range   WBC 8.7 4.0 - 10.5 K/uL   RBC  3.97 3.87 - 5.11 MIL/uL   Hemoglobin 8.3 (L) 12.0 - 15.0 g/dL   HCT 29.3 (L) 36.0 - 46.0 %   MCV 73.8 (L) 78.0 - 100.0 fL   MCH 20.9 (L) 26.0 - 34.0 pg   MCHC 28.3 (L) 30.0 - 36.0 g/dL   RDW 17.7 (H) 11.5 - 15.5 %   Platelets 237 150 - 400 K/uL  Glucose, capillary     Status: Abnormal   Collection Time: 04/10/16  4:09 AM  Result Value Ref Range   Glucose-Capillary 172 (H) 65 - 99 mg/dL  Glucose, capillary     Status: Abnormal   Collection Time: 04/10/16  7:45 AM  Result Value Ref Range   Glucose-Capillary 197 (H) 65 - 99 mg/dL     Studies/Results: No results found.  Marland Kitchen alvimopan  12 mg Oral BID  . amLODipine  5 mg Oral Daily  . DULoxetine  60 mg Oral Daily  . enoxaparin (LOVENOX) injection  40 mg Subcutaneous Q24H  . insulin aspart  0-20 Units Subcutaneous Q4H  . insulin aspart protamine- aspart  30 Units Subcutaneous BID WC  . irbesartan  300 mg Oral Daily  . levothyroxine  137 mcg Oral QAC breakfast  . pantoprazole  40 mg Oral BID      Assessment/Plan: s/p Procedure(s): LAPAROSCOPIC ASSISTED ASCENDING COLECTOMY POSSIBLE OPEN COLECTOMY  POD #2.  Laparoscopic lysis of adhesions, extensive, laparoscopic right colectomy.     Stable.     keep on clear liquid diet due to nausea.     Nausea not unexpected due to extensive lysis of adhesions and ileus.     Mobilize and ambulate ion halls     Incentive spirometry encouraged.     Foley out      Check pathology  Fever.  Suspect atelectasis.  Encourage ambulation in hall and incentive spirometry.   Insulin-dependent diabetes mellitus.    Continue sliding scale insulin every 4 hours.  Basal insulin as needed    Target range of glucose is 120-180 and postop.  Hypertension.    Controlled.  Continue current oral medications.  VTE prophylaxis.    Lovenox    Appreciate [facility] hospital[facility] intervention and monitoring.    Continue   @PROBHOSP @  LOS: 2 days    Catie Chiao M 04/10/2016  . .prob

## 2016-04-11 ENCOUNTER — Inpatient Hospital Stay (HOSPITAL_COMMUNITY): Payer: Medicare Other

## 2016-04-11 ENCOUNTER — Other Ambulatory Visit: Payer: Self-pay

## 2016-04-11 ENCOUNTER — Encounter (HOSPITAL_COMMUNITY): Payer: Self-pay | Admitting: General Surgery

## 2016-04-11 DIAGNOSIS — R0902 Hypoxemia: Secondary | ICD-10-CM

## 2016-04-11 LAB — GLUCOSE, CAPILLARY
GLUCOSE-CAPILLARY: 194 mg/dL — AB (ref 65–99)
GLUCOSE-CAPILLARY: 274 mg/dL — AB (ref 65–99)
Glucose-Capillary: 133 mg/dL — ABNORMAL HIGH (ref 65–99)
Glucose-Capillary: 204 mg/dL — ABNORMAL HIGH (ref 65–99)
Glucose-Capillary: 223 mg/dL — ABNORMAL HIGH (ref 65–99)
Glucose-Capillary: 295 mg/dL — ABNORMAL HIGH (ref 65–99)

## 2016-04-11 LAB — BASIC METABOLIC PANEL
Anion gap: 10 (ref 5–15)
BUN: 6 mg/dL (ref 6–20)
CALCIUM: 8.9 mg/dL (ref 8.9–10.3)
CO2: 23 mmol/L (ref 22–32)
CREATININE: 0.98 mg/dL (ref 0.44–1.00)
Chloride: 101 mmol/L (ref 101–111)
GFR calc non Af Amer: 59 mL/min — ABNORMAL LOW (ref >60)
GLUCOSE: 226 mg/dL — AB (ref 65–99)
Potassium: 4 mmol/L (ref 3.5–5.1)
Sodium: 134 mmol/L — ABNORMAL LOW (ref 135–145)

## 2016-04-11 LAB — TROPONIN I
TROPONIN I: 0.06 ng/mL — AB (ref <0.03)
Troponin I: 0.06 ng/mL (ref <0.03)

## 2016-04-11 LAB — CBC
HEMATOCRIT: 29.6 % — AB (ref 36.0–46.0)
Hemoglobin: 8.6 g/dL — ABNORMAL LOW (ref 12.0–15.0)
MCH: 21.1 pg — ABNORMAL LOW (ref 26.0–34.0)
MCHC: 29.1 g/dL — AB (ref 30.0–36.0)
MCV: 72.5 fL — ABNORMAL LOW (ref 78.0–100.0)
PLATELETS: 206 10*3/uL (ref 150–400)
RBC: 4.08 MIL/uL (ref 3.87–5.11)
RDW: 17.2 % — AB (ref 11.5–15.5)
WBC: 7 10*3/uL (ref 4.0–10.5)

## 2016-04-11 LAB — URINALYSIS, ROUTINE W REFLEX MICROSCOPIC
BILIRUBIN URINE: NEGATIVE
Glucose, UA: >1000 mg/dL — AB
Hgb urine dipstick: NEGATIVE
KETONES UR: 40 mg/dL — AB
LEUKOCYTES UA: NEGATIVE
NITRITE: NEGATIVE
PROTEIN: NEGATIVE mg/dL
Specific Gravity, Urine: 1.016 (ref 1.005–1.030)
pH: 6 (ref 5.0–8.0)

## 2016-04-11 LAB — TSH: TSH: 0.934 u[IU]/mL (ref 0.350–4.500)

## 2016-04-11 LAB — URINE MICROSCOPIC-ADD ON

## 2016-04-11 LAB — MAGNESIUM: Magnesium: 1.6 mg/dL — ABNORMAL LOW (ref 1.7–2.4)

## 2016-04-11 MED ORDER — INSULIN ASPART PROT & ASPART (70-30 MIX) 100 UNIT/ML ~~LOC~~ SUSP: 28.0000 [IU] | Freq: Two times a day (BID) | SUBCUTANEOUS | Status: DC

## 2016-04-11 MED ORDER — DIAZEPAM 2 MG PO TABS
2.0000 mg | ORAL_TABLET | Freq: Two times a day (BID) | ORAL | Status: DC | PRN
  Administered 2016-04-11: 2 mg via ORAL
  Filled 2016-04-11: qty 1

## 2016-04-11 MED ORDER — INSULIN ASPART PROT & ASPART (70-30 MIX) 100 UNIT/ML ~~LOC~~ SUSP
28.0000 [IU] | Freq: Two times a day (BID) | SUBCUTANEOUS | Status: DC
  Administered 2016-04-11: 28 [IU] via SUBCUTANEOUS

## 2016-04-11 MED ORDER — KETOROLAC TROMETHAMINE 10 MG PO TABS
10.0000 mg | ORAL_TABLET | Freq: Four times a day (QID) | ORAL | Status: DC | PRN
  Administered 2016-04-11: 10 mg via ORAL
  Filled 2016-04-11 (×2): qty 1

## 2016-04-11 MED ORDER — SODIUM CHLORIDE 0.9 % IV SOLN
INTRAVENOUS | Status: AC
  Administered 2016-04-11: 10:00:00 via INTRAVENOUS

## 2016-04-11 MED ORDER — SODIUM CHLORIDE 0.9 % IV SOLN: INTRAVENOUS | Status: DC

## 2016-04-11 MED ORDER — INSULIN ASPART PROT & ASPART (70-30 MIX) 100 UNIT/ML ~~LOC~~ SUSP
10.0000 [IU] | Freq: Once | SUBCUTANEOUS | Status: AC
  Administered 2016-04-11: 10 [IU] via SUBCUTANEOUS

## 2016-04-11 MED ORDER — PROMETHAZINE HCL 25 MG/ML IJ SOLN
12.5000 mg | Freq: Once | INTRAMUSCULAR | Status: AC
  Administered 2016-04-11: 12.5 mg via INTRAVENOUS
  Filled 2016-04-11: qty 1

## 2016-04-11 MED ORDER — METOPROLOL TARTRATE 25 MG PO TABS
25.0000 mg | ORAL_TABLET | Freq: Every day | ORAL | Status: DC
  Administered 2016-04-11 – 2016-04-12 (×2): 25 mg via ORAL
  Filled 2016-04-11 (×2): qty 1

## 2016-04-11 NOTE — Progress Notes (Addendum)
3 Days Post-Op  Subjective: Says she feels better and wants to go home.  Had 3 or 4 loose stools She's had some nausea without vomiting possibly due to narcotic Pain well controlled and has not been taking pain medicine during the night. Afebrile.  Still running heart rate over 100 but no chest pain or shortness of breath.  Tried hospitalist following Blood sugars ranging from 133-261. Lab work shows hemoglobin 8.6, WBC 7000, potassium 4.0, creatinine 0.98.  Objective: Vital signs in last 24 hours: Temp:  [98.4 F (36.9 C)-99.7 F (37.6 C)] 98.6 F (37 C) (09/04 0440) Pulse Rate:  [107-115] 115 (09/04 0440) Resp:  [17-18] 17 (09/04 0440) BP: (138-155)/(63-74) 155/74 (09/04 0440) SpO2:  [82 %-96 %] 96 % (09/04 0440) Last BM Date: 04/10/16  Intake/Output from previous day: 09/03 0701 - 09/04 0700 In: 1210 [P.O.:360; I.V.:850] Out: 750 [Urine:750] Intake/Output this shift: Total I/O In: 970 [P.O.:120; I.V.:850] Out: 750 [Urine:750]   EXAM: Gen.: Alert.  Good spirits.  No distress  Lungs: Clear to auscultation bilaterally  Abdomen: Soft.  Obese.  Active bowel sounds.  Appropriate incisional tenderness    Lab Results:  Results for orders placed or performed during the hospital encounter of 04/08/16 (from the past 24 hour(s))  Glucose, capillary     Status: Abnormal   Collection Time: 04/10/16  7:45 AM  Result Value Ref Range   Glucose-Capillary 197 (H) 65 - 99 mg/dL  Glucose, capillary     Status: Abnormal   Collection Time: 04/10/16 11:54 AM  Result Value Ref Range   Glucose-Capillary 250 (H) 65 - 99 mg/dL  Glucose, capillary     Status: Abnormal   Collection Time: 04/10/16  4:22 PM  Result Value Ref Range   Glucose-Capillary 212 (H) 65 - 99 mg/dL  Glucose, capillary     Status: Abnormal   Collection Time: 04/10/16  8:03 PM  Result Value Ref Range   Glucose-Capillary 261 (H) 65 - 99 mg/dL  Glucose, capillary     Status: Abnormal   Collection Time: 04/11/16 12:04 AM   Result Value Ref Range   Glucose-Capillary 133 (H) 65 - 99 mg/dL  Glucose, capillary     Status: Abnormal   Collection Time: 04/11/16  3:29 AM  Result Value Ref Range   Glucose-Capillary 204 (H) 65 - 99 mg/dL  CBC     Status: Abnormal   Collection Time: 04/11/16  3:44 AM  Result Value Ref Range   WBC 7.0 4.0 - 10.5 K/uL   RBC 4.08 3.87 - 5.11 MIL/uL   Hemoglobin 8.6 (L) 12.0 - 15.0 g/dL   HCT 29.6 (L) 36.0 - 46.0 %   MCV 72.5 (L) 78.0 - 100.0 fL   MCH 21.1 (L) 26.0 - 34.0 pg   MCHC 29.1 (L) 30.0 - 36.0 g/dL   RDW 17.2 (H) 11.5 - 15.5 %   Platelets 206 150 - 400 K/uL  Basic metabolic panel     Status: Abnormal   Collection Time: 04/11/16  3:44 AM  Result Value Ref Range   Sodium 134 (L) 135 - 145 mmol/L   Potassium 4.0 3.5 - 5.1 mmol/L   Chloride 101 101 - 111 mmol/L   CO2 23 22 - 32 mmol/L   Glucose, Bld 226 (H) 65 - 99 mg/dL   BUN 6 6 - 20 mg/dL   Creatinine, Ser 0.98 0.44 - 1.00 mg/dL   Calcium 8.9 8.9 - 10.3 mg/dL   GFR calc non Af Amer 59 (L) >  60 mL/min   GFR calc Af Amer >60 >60 mL/min   Anion gap 10 5 - 15     Studies/Results: No results found.  Marland Kitchen amLODipine  5 mg Oral Daily  . bisacodyl  10 mg Rectal BID  . DULoxetine  60 mg Oral Daily  . enoxaparin (LOVENOX) injection  40 mg Subcutaneous Q24H  . insulin aspart  0-20 Units Subcutaneous Q4H  . insulin aspart protamine- aspart  20 Units Subcutaneous BID WC  . irbesartan  300 mg Oral Daily  . levothyroxine  137 mcg Oral QAC breakfast  . pantoprazole  40 mg Oral BID     Assessment/Plan: s/p Procedure(s): LAPAROSCOPIC ASSISTED ASCENDING COLECTOMY POSSIBLE OPEN COLECTOMY  POD #3. Laparoscopic lysis of adhesions, extensive, laparoscopic right colectomy. Stable.      Suspect nausea may be due to narcotics.  Narcotics discontinued      Soft diet      Possibly home tomorrow if she turns the corner Check pathology  Fever.  Suspect atelectasis.  Encourage ambulation in hall and incentive spirometry.   Resolved.  Tachycardia.  EKG ordered by internal medicine shows sinus tachycardia, otherwise normal ECG.   Insulin-dependent diabetes mellitus. Continue sliding scale insulin every 4 hours. Basal insulin as needed    Target range of glucose is 120-180 and postop.  Hypertension. Controlled. Continue current oral medications.  VTE prophylaxis. Lovenox Appreciate [facility] hospitalist intervention and monitoring. Continue  @PROBHOSP @  LOS: 3 days    Cong Hightower M 04/11/2016  . .prob

## 2016-04-11 NOTE — Progress Notes (Addendum)
[facility] PROGRESS NOTE  [name] [id number] DOB: 1950-11-30 DOA: 04/08/2016   PCP: Vidal Schwalbe, MD     Assessment/Plan: Principal Problem:   Type II diabetes mellitus ([place]) Active Problems:   Hypertension   Blood loss anemia   Anemia   Villous adenoma of right colon   Leukocytosis   65 yo hx dm,htn, obesity, diastolic dysfunction, gerd, dvt anemia recently had colonoscopy showing a 3 cm exophytic mass around the ileocecal valve. Biopsy showed villous adenoma with high-grade dysplasia. underwent laparoscopic assisted ascending colectomy today. Comorbidities include insulin-dependent diabetes poorly controlled Hypertension. Hyperlipidemia. CKD stage III. Chronic GERD.  Consult requested for DM and HTN management.  Assessment and plan 1. Type 2 diabetes uncontrolled. Home medications include 70 /30 , 70 units twice a day.  Continue 70/30 at 28  units twice a day.  Labile CBG , inconsistent oral intake Followed by Dr. Buddy Duty for diabetes Continue-Sliding scale insulin every 4 hours Hemoglobin A1c 8.2    2. Hypertension. Stable, continue Norvasc,avapro -Hold Lasix    #3. Anemia. History of same related to acute blood loss. Hemoglobin 10.2>8.3>8.6. Close to baseline. Signs symptoms of active bleeding   #4.Adenoma of the right colon. Per surgery. -pain control  #5. Leukocytosis/fever: mild. Leukocytosis resolved. Fever could be secondary to atelectasis Given hypoxia, fever, tachycardia and will obtain a chest x-ray done which was NL , UA, Place patient on telemetry for at least 24 hours,   EKG nl , troponin 0.06  , venous Doppler negative , echo , if echo WNL , patient will dc in am  Started  metoprolol for sinus tachycardia.  #6-anxiety-patient takes Valium and needed at home, has not taken any since 9/1. Will restart prn  Valium due to patient's tachycardia , see if it improves  DVT prophylaxsis as per surgery  Code Status:  Full  code     Family Communication: Discussed in detail with the patient, all imaging results, lab results explained to the patient   Disposition Plan: Patient would need to be monitored for at least another 24 hours prior to discharge      Consultants:  [facility]  Procedures:  Laparoscopic lysis of adhesions requiring 60 minutes                                      Laparoscopic ascending colectomy  Antibiotics: Anti-infectives    Start     Dose/Rate Route Frequency Ordered Stop   04/08/16 2200  cefoTEtan (CEFOTAN) 2 g in dextrose 5 % 50 mL IVPB     2 g 100 mL/hr over 30 Minutes Intravenous Every 12 hours 04/08/16 1510 04/08/16 2205   04/08/16 1015  cefoTEtan (CEFOTAN) 2 g in dextrose 5 % 50 mL IVPB     2 g 100 mL/hr over 30 Minutes Intravenous To Surgery 04/08/16 1012 04/08/16 1023         HPI/Subjective: CBG better this morning,No nausea.  Pain well controlled.  No dyspnea.  Good urine output  Objective: Vitals:   04/10/16 0606 04/10/16 1413 04/10/16 2102 04/11/16 0440  BP: (!) 149/64 138/63 (!) 145/68 (!) 155/74  Pulse: (!) 114 (!) 110 (!) 107 (!) 115  Resp: 17 18 17 17   Temp: 99.9 F (37.7 C) 99.7 F (37.6 C) 98.4 F (36.9 C) 98.6 F (37 C)  TempSrc: Oral Oral Oral Oral  SpO2: 93% Marland Kitchen)  82% 92% 96%  Weight:      Height:        Intake/Output Summary (Last 24 hours) at 04/11/16 [id number] Last data filed at 04/11/16 0500  Gross per 24 hour  Intake             1210 ml  Output              750 ml  Net              460 ml    Exam:  Examination:  General exam: Appears calm and comfortable  Respiratory system: Clear to auscultation. Respiratory effort normal. Cardiovascular system: S1 & S2 heard, RRR. No JVD, murmurs, rubs, gallops or clicks. No pedal edema. Gastrointestinal system: Abdomen is nondistended, soft and nontender. No organomegaly or masses felt. Normal bowel sounds heard. Central nervous system: Alert and oriented. No focal neurological  deficits. Extremities: Symmetric 5 x 5 power. Skin: No rashes, lesions or ulcers Psychiatry: Judgement and insight appear normal. Mood & affect appropriate.     Data Reviewed: I have personally reviewed following labs and imaging studies  Micro Results No results found for this or any previous visit (from the past 240 hour(s)).  Radiology Reports Ct Abdomen Pelvis W Contrast  Result Date: 03/28/2016 CLINICAL DATA:  Neoplasm of the ascending colon found on colonoscopy 03/03/2016. Diabetes. Hypertension. EXAM: CT ABDOMEN AND PELVIS WITH CONTRAST TECHNIQUE: Multidetector CT imaging of the abdomen and pelvis was performed using the standard protocol following bolus administration of intravenous contrast. CONTRAST:  147mL ISOVUE-300 IOPAMIDOL (ISOVUE-300) INJECTION 61% Creatinine was obtained on site at [place] at [address] Results: Creatinine 0.9 mg/dL. COMPARISON:  06/25/2012 FINDINGS: Lower chest: Clear lung bases. Normal heart size without pericardial or pleural effusion. Hepatobiliary: Inferior right hepatic lobe 9 mm hypo attenuating lesion image 38/series 3 and coronal image 60 is not identified on the prior from 2013. Cholecystectomy, without biliary ductal dilatation. Pancreas: Pancreatic atrophy, without duct dilatation. Spleen: Normal in size, without focal abnormality. Adrenals/Urinary Tract: Normal adrenal glands. Normal kidneys, without hydronephrosis. Normal urinary bladder. Stomach/Bowel: Normal stomach, without wall thickening. Scattered colonic diverticula. No dominant colonic mass identified. Normal terminal ileum. Appendectomy. Proximal transverse duodenal diverticulum. Small bowel otherwise unremarkable. Vascular/Lymphatic: Aortic and branch vessel atherosclerosis. No abdominopelvic adenopathy. Reproductive: Normal uterus and adnexa. Other: No significant free fluid. No evidence of omental or peritoneal disease. Musculoskeletal: Trans pedicle screw fixation at  L3-4. IMPRESSION: 1. No ascending colonic primary is identified. 2. 9 mm low-density right hepatic lobe lesion is not readily apparent on the prior exam. This is indeterminate. Cannot exclude isolated hepatic metastasis. Consider further evaluation with dedicated pre and post contrast abdominal MRI (ideally with Eovist). 3.  Aortic atherosclerosis. Electronically Signed   By: Abigail Miyamoto M.D.   On: 03/28/2016 14:56     CBC  Recent Labs Lab 04/08/16 1627 04/09/16 0303 04/10/16 0112 04/11/16 0344  WBC 13.6* 9.1 8.7 7.0  HGB 10.2* 9.0* 8.3* 8.6*  HCT 34.3* 31.3* 29.3* 29.6*  PLT 265 248 237 206  MCV 72.4* 72.8* 73.8* 72.5*  MCH 21.5* 20.9* 20.9* 21.1*  MCHC 29.7* 28.8* 28.3* 29.1*  RDW 17.5* 17.8* 17.7* 17.2*    Chemistries   Recent Labs Lab 04/08/16 1627 04/09/16 0303 04/10/16 0112 04/11/16 0344  NA  --  137 134* 134*  K  --  4.1 4.0 4.0  CL  --  107 103 101  CO2  --  25 24 23   GLUCOSE  --  150* 170* 226*  BUN  --  8 6 6   CREATININE 1.11* 0.95 1.09* 0.98  CALCIUM  --  8.6* 8.4* 8.9  AST  --   --  17  --   ALT  --   --  15  --   ALKPHOS  --   --  73  --   BILITOT  --   --  0.6  --    ------------------------------------------------------------------------------------------------------------------ estimated creatinine clearance is 62.5 mL/min (by C-G formula based on SCr of 0.98 mg/dL). ------------------------------------------------------------------------------------------------------------------  Recent Labs  04/08/16 1627  HGBA1C 8.2*   ------------------------------------------------------------------------------------------------------------------ No results for input(s): CHOL, HDL, LDLCALC, TRIG, CHOLHDL, LDLDIRECT in the last 72 hours. ------------------------------------------------------------------------------------------------------------------  Recent Labs  04/09/16 0303  TSH 0.491    ------------------------------------------------------------------------------------------------------------------ No results for input(s): VITAMINB12, FOLATE, FERRITIN, TIBC, IRON, RETICCTPCT in the last 72 hours.  Coagulation profile No results for input(s): INR, PROTIME in the last 168 hours.  No results for input(s): DDIMER in the last 72 hours.  Cardiac Enzymes No results for input(s): CKMB, TROPONINI, MYOGLOBIN in the last 168 hours.  Invalid input(s): CK ------------------------------------------------------------------------------------------------------------------ Invalid input(s): POCBNP   CBG:  Recent Labs Lab 04/10/16 1622 04/10/16 2003 04/11/16 0004 04/11/16 0329 04/11/16 0723  GLUCAP 212* 261* 133* 204* 295*       Studies: No results found.    Lab Results  Component Value Date   HGBA1C 8.2 (H) 04/08/2016   HGBA1C 8.7 (H) 04/01/2016   HGBA1C 10.5 (H) 03/01/2013   Lab Results  Component Value Date   LDLCALC 108 (H) 05/02/2013   CREATININE 0.98 04/11/2016       Scheduled Meds: . amLODipine  5 mg Oral Daily  . bisacodyl  10 mg Rectal BID  . DULoxetine  60 mg Oral Daily  . enoxaparin (LOVENOX) injection  40 mg Subcutaneous Q24H  . insulin aspart  0-20 Units Subcutaneous Q4H  . insulin aspart protamine- aspart  10 Units Subcutaneous Once  . insulin aspart protamine- aspart  28 Units Subcutaneous BID WC  . irbesartan  300 mg Oral Daily  . levothyroxine  137 mcg Oral QAC breakfast  . pantoprazole  40 mg Oral BID   Continuous Infusions: . sodium chloride       LOS: 3 days    Time spent: >30 MINS    [facility]  [facility] Pager [phone]. If 7PM-7AM, please contact night-coverage at [email or web address], password [facility] 04/11/2016, 8:22 AM  LOS: 3 days

## 2016-04-11 NOTE — Care Management Important Message (Signed)
Important Message  Patient Details  Name: [name] MRN: [id number] Date of Birth: [birth year]   Medicare Important Message Given:  Yes    [name] 04/11/2016, 9:37 AM

## 2016-04-11 NOTE — Progress Notes (Signed)
Pt. requested Phenergan for nausea as per home, however after receiving med  pt. became confused with  hallucinations. SCD's removed, IV changed to NSL , bed alarm as well as BSC in use for pt's. safety.

## 2016-04-11 NOTE — Progress Notes (Signed)
VASCULAR LAB PRELIMINARY  PRELIMINARY  PRELIMINARY  PRELIMINARY  Bilateral lower extremity venous duplex completed.    Preliminary report:  Bilateral:  No evidence of DVT, superficial thrombosis, or Baker's Cyst.   Rucha Wissinger, RVS 04/11/2016, 11:02 AM

## 2016-04-12 ENCOUNTER — Inpatient Hospital Stay (HOSPITAL_COMMUNITY): Payer: Medicare Other

## 2016-04-12 ENCOUNTER — Other Ambulatory Visit (INDEPENDENT_AMBULATORY_CARE_PROVIDER_SITE_OTHER): Payer: Self-pay | Admitting: Physician Assistant

## 2016-04-12 DIAGNOSIS — I10 Essential (primary) hypertension: Secondary | ICD-10-CM

## 2016-04-12 DIAGNOSIS — D374 Neoplasm of uncertain behavior of colon: Secondary | ICD-10-CM

## 2016-04-12 LAB — ECHOCARDIOGRAM COMPLETE
CHL CUP RV SYS PRESS: 43 mmHg
CHL CUP TV REG PEAK VELOCITY: 316 cm/s
EERAT: 12.16
FS: 29 % (ref 28–44)
Height: 62.5 in
IVS/LV PW RATIO, ED: 0.81
LA ID, A-P, ES: 38 mm
LA diam index: 1.93 cm/m2
LA vol A4C: 65 ml
LA vol index: 34.1 mL/m2
LAVOL: 67.2 mL
LDCA: 3.14 cm2
LEFT ATRIUM END SYS DIAM: 38 mm
LV E/e' medial: 12.16
LV E/e'average: 12.16
LV PW d: 12 mm — AB (ref 0.6–1.1)
LVELAT: 8.16 cm/s
LVOT diameter: 20 mm
MV pk A vel: 138 m/s
MVPG: 4 mmHg
MVPKEVEL: 99.2 m/s
RV LATERAL S' VELOCITY: 17.8 cm/s
RV TAPSE: 24.8 mm
TDI e' lateral: 8.16
TDI e' medial: 7.83
TRMAXVEL: 316 cm/s
WEIGHTICAEL: 3386.27 [oz_av]

## 2016-04-12 LAB — GLUCOSE, CAPILLARY
GLUCOSE-CAPILLARY: 375 mg/dL — AB (ref 65–99)
Glucose-Capillary: 115 mg/dL — ABNORMAL HIGH (ref 65–99)
Glucose-Capillary: 196 mg/dL — ABNORMAL HIGH (ref 65–99)

## 2016-04-12 LAB — MAGNESIUM: MAGNESIUM: 1.4 mg/dL — AB (ref 1.7–2.4)

## 2016-04-12 LAB — BASIC METABOLIC PANEL
ANION GAP: 14 (ref 5–15)
BUN: 10 mg/dL (ref 6–20)
CALCIUM: 8.9 mg/dL (ref 8.9–10.3)
CO2: 22 mmol/L (ref 22–32)
Chloride: 101 mmol/L (ref 101–111)
Creatinine, Ser: 1.16 mg/dL — ABNORMAL HIGH (ref 0.44–1.00)
GFR, EST AFRICAN AMERICAN: 56 mL/min — AB (ref >60)
GFR, EST NON AFRICAN AMERICAN: 48 mL/min — AB (ref >60)
Glucose, Bld: 250 mg/dL — ABNORMAL HIGH (ref 65–99)
POTASSIUM: 3.3 mmol/L — AB (ref 3.5–5.1)
SODIUM: 137 mmol/L (ref 135–145)

## 2016-04-12 MED ORDER — MAGNESIUM SULFATE 2 GM/50ML IV SOLN
2.0000 g | Freq: Once | INTRAVENOUS | Status: AC
  Administered 2016-04-12: 2 g via INTRAVENOUS
  Filled 2016-04-12: qty 50

## 2016-04-12 MED ORDER — POTASSIUM CHLORIDE CRYS ER 20 MEQ PO TBCR
40.0000 meq | EXTENDED_RELEASE_TABLET | Freq: Once | ORAL | Status: AC
Start: 2016-04-12 — End: 2016-04-12
  Administered 2016-04-12: 40 meq via ORAL
  Filled 2016-04-12: qty 2

## 2016-04-12 MED ORDER — METOPROLOL TARTRATE 25 MG PO TABS: 12.5000 mg | ORAL_TABLET | Freq: Every day | ORAL | 0 refills | Status: DC

## 2016-04-12 MED ORDER — DIAZEPAM 2 MG PO TABS: 1.0000 mg | ORAL_TABLET | Freq: Two times a day (BID) | ORAL | 0 refills | Status: DC | PRN

## 2016-04-12 MED ORDER — INSULIN ASPART PROT & ASPART (70-30 MIX) 100 UNIT/ML ~~LOC~~ SUSP: 30.0000 [IU] | Freq: Two times a day (BID) | SUBCUTANEOUS | 11 refills | Status: DC

## 2016-04-12 NOTE — Progress Notes (Signed)
[facility] PROGRESS NOTE  [name] [id number] DOB: April 10, 1951 DOA: 04/08/2016   PCP: Vidal Schwalbe, MD     Assessment/Plan: Principal Problem:   Type II diabetes mellitus ([place]) Active Problems:   Hypertension   Blood loss anemia   Anemia   Villous adenoma of right colon   Leukocytosis   Hypoxia   65 yo hx dm,htn, obesity, diastolic dysfunction, gerd, dvt anemia recently had colonoscopy showing a 3 cm exophytic mass around the ileocecal valve. Biopsy showed villous adenoma with high-grade dysplasia. underwent laparoscopic assisted ascending colectomy today. Comorbidities include insulin-dependent diabetes poorly controlled Hypertension. Hyperlipidemia. CKD stage III. Chronic GERD.  Consult requested for DM and HTN management.  Assessment and plan 1. Type 2 diabetes uncontrolled. Home medications include 70 /30 , 70 units twice a day.  Continue 70/30 at 30  units twice a day.  Labile CBG   Followed by Dr. Buddy Duty for diabetes Hemoglobin A1c 8.2    2. Hypertension. Stable, continue Norvasc,avapro -Hold Lasix    #3. Anemia. History of same related to acute blood loss. Hemoglobin 10.2>8.3>8.6. Close to baseline. Signs symptoms of active bleeding   #4.Adenoma of the right colon. Per surgery. -pain control  #5. Leukocytosis/fever: mild. Leukocytosis resolved. Fever could be secondary to atelectasis Given hypoxia, fever, tachycardia  chest x-ray was obtained which was negative, UA negative Telemetry uneventful,   EKG nl , troponin 0.06  2, venous Doppler negative , awaiting completion of 2-D echo ,  Would recommend PCP to follow-up on the results of 2-D echo Started  metoprolol for sinus tachycardia. Would continue low-dose beta blocker on discharge   #6-anxiety/hallucinations-patient attributes this to taking Percocet and IV Dilaudid at the same time. She is very much aware of her surroundings, she is also aware that she is seeing things that  she should not. She states that she has not slept well in 3 weeks. She takes Restoril to help her sleep Recommended patient to minimize Benadryl, Valium, pain medications, she should do better if she is rested. Discussed with her husband by the bedside advised discharge from a medical standpoint. Expect that her symptoms would resolve. No evidence of stroke, no focal deficits noted at this time.  DVT prophylaxsis as per surgery  Code Status:  Full code     Family Communication: Discussed in detail with the patient, all imaging results, lab results explained to the patient   Disposition Plan: Stable to go home today from a medical standpoint, once the patient has completed 2-D echo      Consultants:  [facility]  Procedures:  Laparoscopic lysis of adhesions requiring 60 minutes                                      Laparoscopic ascending colectomy  Antibiotics: Anti-infectives    Start     Dose/Rate Route Frequency Ordered Stop   04/08/16 2200  cefoTEtan (CEFOTAN) 2 g in dextrose 5 % 50 mL IVPB     2 g 100 mL/hr over 30 Minutes Intravenous Every 12 hours 04/08/16 1510 04/08/16 2205   04/08/16 1015  cefoTEtan (CEFOTAN) 2 g in dextrose 5 % 50 mL IVPB     2 g 100 mL/hr over 30 Minutes Intravenous To Surgery 04/08/16 1012 04/08/16 1023         HPI/Subjective: Patient accepts the fact that she has been seeing things  which are not there, otherwise quite oriented  Objective: Vitals:   04/11/16 0440 04/11/16 1323 04/11/16 2150 04/12/16 0645  BP: (!) 155/74 (!) 144/61 136/63 (!) 143/63  Pulse: (!) 115 (!) 104 (!) 105 (!) 102  Resp: 17 17 18 19   Temp: 98.6 F (37 C) 98.6 F (37 C) 99.1 F (37.3 C) 98.6 F (37 C)  TempSrc: Oral Oral Oral   SpO2: 96% 94% 93% 97%  Weight:      Height:        Intake/Output Summary (Last 24 hours) at 04/12/16 1257 Last data filed at 04/12/16 0540  Gross per 24 hour  Intake              240 ml  Output              900 ml  Net              -660 ml    Exam:  Examination:  General exam: Appears calm and comfortable  Respiratory system: Clear to auscultation. Respiratory effort normal. Cardiovascular system: S1 & S2 heard, RRR. No JVD, murmurs, rubs, gallops or clicks. No pedal edema. Gastrointestinal system: Abdomen is nondistended, soft and nontender. No organomegaly or masses felt. Normal bowel sounds heard. Central nervous system: Alert and oriented. No focal neurological deficits. Extremities: Symmetric 5 x 5 power. Skin: No rashes, lesions or ulcers Psychiatry: Judgement and insight appear normal. Mood & affect appropriate.     Data Reviewed: I have personally reviewed following labs and imaging studies  Micro Results No results found for this or any previous visit (from the past 240 hour(s)).  Radiology Reports Dg Chest 2 View  Result Date: 04/11/2016 CLINICAL DATA:  Hypoxia.  Confusion with hallucinations. EXAM: CHEST  2 VIEW COMPARISON:  01/15/2016 FINDINGS: Very low inspiratory lung volumes are seen. Lordotic positioning noted. No evidence of pulmonary opacity. Heart size is within normal limits allowing for low lung volumes. IMPRESSION: Very low lung volumes.  No acute findings. Electronically Signed   By: Earle Gell M.D.   On: 04/11/2016 09:19   Ct Abdomen Pelvis W Contrast  Result Date: 03/28/2016 CLINICAL DATA:  Neoplasm of the ascending colon found on colonoscopy 03/03/2016. Diabetes. Hypertension. EXAM: CT ABDOMEN AND PELVIS WITH CONTRAST TECHNIQUE: Multidetector CT imaging of the abdomen and pelvis was performed using the standard protocol following bolus administration of intravenous contrast. CONTRAST:  125mL ISOVUE-300 IOPAMIDOL (ISOVUE-300) INJECTION 61% Creatinine was obtained on site at [facility] at [address] Results: Creatinine 0.9 mg/dL. COMPARISON:  06/25/2012 FINDINGS: Lower chest: Clear lung bases. Normal heart size without pericardial or pleural effusion. Hepatobiliary:  Inferior right hepatic lobe 9 mm hypo attenuating lesion image 38/series 3 and coronal image 60 is not identified on the prior from 2013. Cholecystectomy, without biliary ductal dilatation. Pancreas: Pancreatic atrophy, without duct dilatation. Spleen: Normal in size, without focal abnormality. Adrenals/Urinary Tract: Normal adrenal glands. Normal kidneys, without hydronephrosis. Normal urinary bladder. Stomach/Bowel: Normal stomach, without wall thickening. Scattered colonic diverticula. No dominant colonic mass identified. Normal terminal ileum. Appendectomy. Proximal transverse duodenal diverticulum. Small bowel otherwise unremarkable. Vascular/Lymphatic: Aortic and branch vessel atherosclerosis. No abdominopelvic adenopathy. Reproductive: Normal uterus and adnexa. Other: No significant free fluid. No evidence of omental or peritoneal disease. Musculoskeletal: Trans pedicle screw fixation at L3-4. IMPRESSION: 1. No ascending colonic primary is identified. 2. 9 mm low-density right hepatic lobe lesion is not readily apparent on the prior exam. This is indeterminate. Cannot exclude isolated hepatic metastasis. Consider  further evaluation with dedicated pre and post contrast abdominal MRI (ideally with Eovist). 3.  Aortic atherosclerosis. Electronically Signed   By: Abigail Miyamoto M.D.   On: 03/28/2016 14:56     CBC  Recent Labs Lab 04/08/16 1627 04/09/16 0303 04/10/16 0112 04/11/16 0344  WBC 13.6* 9.1 8.7 7.0  HGB 10.2* 9.0* 8.3* 8.6*  HCT 34.3* 31.3* 29.3* 29.6*  PLT 265 248 237 206  MCV 72.4* 72.8* 73.8* 72.5*  MCH 21.5* 20.9* 20.9* 21.1*  MCHC 29.7* 28.8* 28.3* 29.1*  RDW 17.5* 17.8* 17.7* 17.2*    Chemistries   Recent Labs Lab 04/08/16 1627 04/09/16 0303 04/10/16 0112 04/11/16 0344 04/11/16 0836 04/12/16 0702  NA  --  137 134* 134*  --  137  K  --  4.1 4.0 4.0  --  3.3*  CL  --  107 103 101  --  101  CO2  --  25 24 23   --  22  GLUCOSE  --  150* 170* 226*  --  250*  BUN  --  8 6  6   --  10  CREATININE 1.11* 0.95 1.09* 0.98  --  1.16*  CALCIUM  --  8.6* 8.4* 8.9  --  8.9  MG  --   --   --   --  1.6* 1.4*  AST  --   --  17  --   --   --   ALT  --   --  15  --   --   --   ALKPHOS  --   --  73  --   --   --   BILITOT  --   --  0.6  --   --   --    ------------------------------------------------------------------------------------------------------------------ estimated creatinine clearance is 52.8 mL/min (by C-G formula based on SCr of 1.16 mg/dL). ------------------------------------------------------------------------------------------------------------------ No results for input(s): HGBA1C in the last 72 hours. ------------------------------------------------------------------------------------------------------------------ No results for input(s): CHOL, HDL, LDLCALC, TRIG, CHOLHDL, LDLDIRECT in the last 72 hours. ------------------------------------------------------------------------------------------------------------------  Recent Labs  04/11/16 0827  TSH 0.934   ------------------------------------------------------------------------------------------------------------------ No results for input(s): VITAMINB12, FOLATE, FERRITIN, TIBC, IRON, RETICCTPCT in the last 72 hours.  Coagulation profile No results for input(s): INR, PROTIME in the last 168 hours.  No results for input(s): DDIMER in the last 72 hours.  Cardiac Enzymes  Recent Labs Lab 04/11/16 0836 04/11/16 1446  TROPONINI 0.06* 0.06*   ------------------------------------------------------------------------------------------------------------------ Invalid input(s): POCBNP   CBG:  Recent Labs Lab 04/11/16 1558 04/11/16 2006 04/12/16 0014 04/12/16 0405 04/12/16 1215  GLUCAP 274* 223* 196* 115* 375*       Studies: Dg Chest 2 View  Result Date: 04/11/2016 CLINICAL DATA:  Hypoxia.  Confusion with hallucinations. EXAM: CHEST  2 VIEW COMPARISON:  01/15/2016 FINDINGS: Very low  inspiratory lung volumes are seen. Lordotic positioning noted. No evidence of pulmonary opacity. Heart size is within normal limits allowing for low lung volumes. IMPRESSION: Very low lung volumes.  No acute findings. Electronically Signed   By: Earle Gell M.D.   On: 04/11/2016 09:19      Lab Results  Component Value Date   HGBA1C 8.2 (H) 04/08/2016   HGBA1C 8.7 (H) 04/01/2016   HGBA1C 10.5 (H) 03/01/2013   Lab Results  Component Value Date   LDLCALC 108 (H) 05/02/2013   CREATININE 1.16 (H) 04/12/2016       Scheduled Meds: . amLODipine  5 mg Oral Daily  . DULoxetine  60 mg Oral Daily  . enoxaparin (  LOVENOX) injection  40 mg Subcutaneous Q24H  . insulin aspart  0-20 Units Subcutaneous Q4H  . insulin aspart protamine- aspart  28 Units Subcutaneous BID WC  . irbesartan  300 mg Oral Daily  . levothyroxine  137 mcg Oral QAC breakfast  . metoprolol tartrate  25 mg Oral Daily  . pantoprazole  40 mg Oral BID   Continuous Infusions:     LOS: 4 days    Time spent: >30 MINS    [facility]  [facility] Pager [phone]. If 7PM-7AM, please contact night-coverage at [email or web address], password [facility] 04/12/2016, 12:57 PM  LOS: 4 days

## 2016-04-12 NOTE — Discharge Instructions (Signed)
LAPAROSCOPIC SURGERY: POST OP INSTRUCTIONS  1. DIET: Follow a light bland diet the first 24 hours after arrival home, such as soup, liquids, crackers, etc. Be sure to include lots of fluids daily. Avoid fast food or heavy meals as your are more likely to get nauseated. Eat a low fat the next few days after surgery.  2. Take your usually prescribed home medications unless otherwise directed. 3. PAIN CONTROL:  1. Pain is best controlled by a usual combination of three different methods TOGETHER:  1. Ice/Heat 2. Over the counter pain medication 2. Most patients will experience some swelling and bruising around the incisions. Ice packs or heating pads (30-60 minutes up to 6 times a day) will help. Use ice for the first few days to help decrease swelling and bruising, then switch to heat to help relax tight/sore spots and speed recovery. Some people prefer to use ice alone, heat alone, alternating between ice & heat. Experiment to what works for you. Swelling and bruising can take several weeks to resolve.  3. It is helpful to take an over-the-counter pain medication regularly for the first few weeks. Choose one of the following that works best for you:  1. Naproxen (Aleve, etc) Two 220mg  tabs twice a day 2. Ibuprofen (Advil, etc) Three 200mg  tabs four times a day (every meal & bedtime) 3. Acetaminophen (Tylenol, etc) 500-650mg  four times a day (every meal & bedtime) 4. Avoid getting constipated. Between the surgery and the pain medications, it is common to experience some constipation. Increasing fluid intake and taking a fiber supplement (such as Metamucil, Citrucel, FiberCon, MiraLax, etc) 1-2 times a day regularly will usually help prevent this problem from occurring. A mild laxative (prune juice, Milk of Magnesia, MiraLax, etc) should be taken according to package directions if there are no bowel movements after 48 hours.  5. Watch out for diarrhea. If you have many loose bowel movements, simplify  your diet to bland foods & liquids for a few days. Stop any stool softeners and decrease your fiber supplement. Switching to mild anti-diarrheal medications (Kayopectate, Pepto Bismol) can help. If this worsens or does not improve, please call us. 6. Wash / shower every day. You may shower over the dressings as they are waterproof. Continue to shower over incision(s) after the dressing is off. 7. You may have steri-strips (small skin tapes) in place directly over the incision.  These strips should be left on the skin for 7-10 days.  If your surgeon used skin glue on the incision, you may shower in 24 hours.  The glue will flake off over the next 2-3 weeks.  Any sutures or staples will be removed at the office during your follow-up visit. You may find that a light gauze bandage over your incision may keep your staples from being rubbed or pulled. You may shower and replace the bandage daily. 8. ACTIVITIES as tolerated:  1. You may resume regular (light) daily activities beginning the next day--such as daily self-care, walking, climbing stairs--gradually increasing activities as tolerated. If you can walk 30 minutes without difficulty, it is safe to try more intense activity such as jogging, treadmill, bicycling, low-impact aerobics, swimming, etc. 2. Save the most intensive and strenuous activity for last such as sit-ups, heavy lifting, contact sports, etc Refrain from any heavy lifting or straining until you are off narcotics for pain control.  3. DO NOT PUSH THROUGH PAIN. Let pain be your guide: If it hurts to do something, don't do it. Pain is  your body warning you to avoid that activity for another week until the pain goes down. 4. You may drive when you are no longer taking prescription pain medication, you can comfortably wear a seatbelt, and you can safely maneuver your car and apply brakes. 5. You may have sexual intercourse when it is comfortable.  9. FOLLOW UP in our office  1. Follow-up 04/19/16  at 2:30pm. You will see a nurse to get your staples removed. Please arrive no later than 2:15pm to register and fill out necessary paperwork.      10. IF YOU HAVE DISABILITY OR FAMILY LEAVE FORMS, BRING THEM TO THE OFFICE FOR PROCESSING.   WHEN TO CALL us [phone]:  1. Poor pain control 2. Reactions / problems with new medications (rash/itching, nausea, etc)  3. Fever over 101.5 F (38.5 C) 4. Inability to urinate 5. Nausea and/or vomiting 6. Worsening swelling or bruising 7. Continued bleeding from incision. 8. Increased pain, redness, or drainage from the incision  The clinic staff is available to answer your questions during regular business hours (8:30am-5pm). Please dont hesitate to call and ask to speak to one of our nurses for clinical concerns.  If you have a medical emergency, go to the nearest emergency room or call 911.  A surgeon from [facility] is always on call at the [facility], [place], [place], [place], [place] [address] ?  MAIN: [phone] ? TOLL FREE: [phone] ?  FAX [phone]  [email or web address]

## 2016-04-12 NOTE — Progress Notes (Signed)
Occupational Therapy Evaluation Patient Details Name: [name] MRN: [id number] DOB: April 07, 1951 Today's Date: 04/12/2016    History of Present Illness 65 yo hx dm,htn, obesity, diastolic dysfunction, gerd, dvt anemia recently had colonoscopy showing a 3 cm exophytic mass around the ileocecal valve.  Biopsy showed villous adenoma with high-grade dysplasia.  underwent laparoscopic assisted ascending colectomy   Clinical Impression   Patient is having no difficulty with ADL performance. She is having some confusion but states she has family at home 24/7. She is upset that nursing will not allow her to ambulate in the hallway on her own.  No further OT needs at this time.     Follow Up Recommendations  No OT follow up;Supervision/Assistance - 24 hour (for cognition/decreased safety awareness)    Equipment Recommendations  None recommended by OT    Recommendations for Other Services       Precautions / Restrictions Restrictions Weight Bearing Restrictions: No      Mobility Bed Mobility               General bed mobility comments: NT -- up in chair  Transfers Overall transfer level: Modified independent Equipment used: None             General transfer comment: ambs in room supervision pushing IV pole    Balance Overall balance assessment: No apparent balance deficits (not formally assessed)                                          ADL Overall ADL's : Needs assistance/impaired Eating/Feeding: Independent;Sitting   Grooming: Wash/dry hands;Wash/dry face;Supervision/safety;Standing   Upper Body Bathing: Set up;Sitting   Lower Body Bathing: Supervison/ safety;Sit to/from stand   Upper Body Dressing : Set up;Sitting   Lower Body Dressing: Supervision/safety;Sit to/from stand   Toilet Transfer: Supervision/safety;Ambulation;Comfort height toilet   Toileting- Clothing Manipulation and Hygiene: Supervision/safety;Sitting/lateral  lean;Sit to/from stand       Functional mobility during ADLs: Supervision/safety (pushing IV pole) General ADL Comments: Patient having no difficulty completing ADLs. Very upset that nurses will not allow her to ambulate in the halls. Remembers hallucinating but says she is not currently.     Vision     Perception     Praxis      Pertinent Vitals/Pain Pain Assessment: No/denies pain     Hand Dominance Right   Extremity/Trunk Assessment Upper Extremity Assessment Upper Extremity Assessment: Overall WFL for tasks assessed   Lower Extremity Assessment Lower Extremity Assessment: Overall WFL for tasks assessed   Cervical / Trunk Assessment Cervical / Trunk Assessment: Normal   Communication Communication Communication: No difficulties   Cognition Arousal/Alertness: Awake/alert Behavior During Therapy: WFL for tasks assessed/performed Overall Cognitive Status: No family/caregiver present to determine baseline cognitive functioning                     General Comments       Exercises       Shoulder Instructions      Home Living Family/patient expects to be discharged to:: Private residence Living Arrangements: Spouse/significant other Available Help at Discharge: Family;Available 24 hours/day Type of Home: House Home Access: Stairs to enter [employer] of Steps: 3 Entrance Stairs-Rails: None Home Layout: One level     Bathroom Shower/Tub: Producer, television/film/video: Standard     Home Equipment: Environmental consultant - 2 wheels;Cane - single  point;Crutches;Bedside commode          Prior Functioning/Environment Level of Independence: Independent             OT Diagnosis: Other (comment);Altered mental status (s/p abdominal surgery)   OT Problem List: Decreased safety awareness   OT Treatment/Interventions:      OT Goals(Current goals can be found in the care plan section) Acute Rehab OT Goals Patient Stated Goal: to go home OT Goal  Formulation: All assessment and education complete, DC therapy  OT Frequency:     Barriers to D/C:            Co-evaluation              End of Session    Activity Tolerance: Patient tolerated treatment well Patient left: in chair;with call bell/phone within reach   Time: 1011-1025 OT Time Calculation (min): 14 min Charges:  OT General Charges $OT Visit: 1 Procedure OT Evaluation $OT Eval Low Complexity: 1 Procedure G-Codes:    Rasheed Welty A 04/19/16, 10:32 AM

## 2016-04-12 NOTE — Discharge Summary (Signed)
[facility] Discharge Summary   Patient ID: [name] MRN: [id number] DOB/AGE: 1950/09/17 65 y.o.  Admit date: 04/08/2016 Discharge date: 04/12/2016  Admitting Diagnosis: Villous adenoma right colon  Discharge Diagnosis Patient Active Problem List   Diagnosis Date Noted  . Hypoxia   . Villous adenoma of right colon 04/08/2016  . Leukocytosis 04/08/2016  . Anemia   . Diastolic dysfunction   . Mild aortic insufficiency   . Other nonspecific abnormal cardiovascular system function study 05/03/2013  . SOB (shortness of breath) 05/01/2013  . Atypical chest pain 02/28/2013  . Dysphagia 02/28/2013  . UTI (lower urinary tract infection) 07/06/2012  . Intractable nausea and vomiting 07/06/2012  . Blood loss anemia 07/06/2012  . Acute kidney injury ([place]) 07/06/2012  . Epigastric abdominal pain 06/27/2012  . Type II diabetes mellitus ([place]) 07/28/2011  . Hypothyroidism 07/28/2011  . Hypertension 07/28/2011  . Hepatic steatosis 07/28/2011    Consultants Dyanne Carrel Np, internal medicine Imaging: Dg Chest 2 View  Result Date: 04/11/2016 CLINICAL DATA:  Hypoxia.  Confusion with hallucinations. EXAM: CHEST  2 VIEW COMPARISON:  01/15/2016 FINDINGS: Very low inspiratory lung volumes are seen. Lordotic positioning noted. No evidence of pulmonary opacity. Heart size is within normal limits allowing for low lung volumes. IMPRESSION: Very low lung volumes.  No acute findings. Electronically Signed   By: Earle Gell M.D.   On: 04/11/2016 09:19   CT abdomen pelvis with contrast 03/28/16: 1. No ascending colonic primary is identified. 2. 9 mm low-density right hepatic lobe lesion is not readily apparent on the prior exam. This is indeterminate. Cannot exclude isolated hepatic metastasis. Consider further evaluation with dedicated pre and post contrast abdominal MRI (ideally with Eovist). 3.  Aortic atherosclerosis.  Procedures Dr. Dalbert Batman (04/08/16) - Laparoscopic lysis of  adhesions requiring 60 minutes, Laparoscopic ascending colectomy  Hospital Course:  KADIATU [name] is a 65yo female with a precancerous colonic mass, villous adenoma with high-grade dysplasia of the cecum.  She was admitted to [facility] on 04/08/16 and underwent the procedure listed above. She did struggle with some postoperative nausea and pain, but this slowly improved after discontinuing narcotics and slowly progressing her diet.  On POD 3 she became confused and was hallucinating, internal medicine evaluated the patient and felt that this was due to being on percocet and IV dilaudid at the same time, and her lack of sleep. Prior to discharge her hallucinations improved and she was alert and oriented to person, time, and place. On POD4, the patient was voiding well, tolerating diet, ambulating well, pain well controlled, vital signs stable, incisions c/d/i and felt stable for discharge home. She was not discharged with any narcotic pain medication. Patient will follow up in our office in 1 week for staple removal, and knows to call with questions or concerns.    Physical Exam: General appearance: Alert.  Pleasant.  Does not appear in any physical distress.  Ambulating independently.  Has her makeup on.  Easily distracted. Resp: No wheeze or rhonchi.  Decreased breath sounds at bases SPO2 93% on room air, repeat just now 97% on room air. GI: Obese.  Soft.  Incisions clean.  Does not appear distended.  Minimally tender.  Fairly benign exam for postop state.    Medication List    STOP taking these medications   diphenhydrAMINE 25 mg capsule Commonly known as:  BENADRYL   insulin NPH-regular Human (70-30) 100 UNIT/ML injection Commonly known as:  NOVOLIN 70/30   promethazine 25 MG  tablet Commonly known as:  PHENERGAN     TAKE these medications   acetaminophen 500 MG tablet Commonly known as:  TYLENOL Take 1,000 mg by mouth every 6 (six) hours as needed for mild pain, moderate pain, fever or  headache.   amLODipine 5 MG tablet Commonly known as:  NORVASC Take 5 mg by mouth daily.   aspirin 81 MG chewable tablet Chew 81 mg by mouth daily.   ciprofloxacin 500 MG tablet Commonly known as:  CIPRO Take 1 tablet (500 mg total) by mouth 2 (two) times daily.   diazepam 2 MG tablet Commonly known as:  VALIUM Take 0.5 tablets (1 mg total) by mouth every 12 (twelve) hours as needed for anxiety or muscle spasms. What changed:  how much to take  when to take this  reasons to take this   DULoxetine 60 MG capsule Commonly known as:  CYMBALTA Take 60 mg by mouth daily.   fluticasone 50 MCG/ACT nasal spray Commonly known as:  FLONASE Place 2 sprays into both nostrils daily as needed for allergies or rhinitis.   furosemide 20 MG tablet Commonly known as:  LASIX Take 20-40 mg by mouth daily as needed for fluid.   HYDROcodone-acetaminophen 5-325 MG tablet Commonly known as:  NORCO/VICODIN Take 1 tablet by mouth every 4 (four) hours as needed for moderate pain or severe pain.   ibuprofen 800 MG tablet Commonly known as:  ADVIL,MOTRIN Take 800 mg by mouth every 8 (eight) hours as needed for headache or moderate pain.   insulin aspart protamine- aspart (70-30) 100 UNIT/ML injection Commonly known as:  NOVOLOG MIX 70/30 Inject 0.3 mLs (30 Units total) into the skin 2 (two) times daily with a meal.   levothyroxine 137 MCG tablet Commonly known as:  SYNTHROID, LEVOTHROID Take 137 mcg by mouth daily before breakfast.   metoprolol tartrate 25 MG tablet Commonly known as:  LOPRESSOR Take 0.5 tablets (12.5 mg total) by mouth daily.   metroNIDAZOLE 0.75 % gel Commonly known as:  METROGEL Apply 1 application topically daily.   pantoprazole 40 MG tablet Commonly known as:  PROTONIX Take 40 mg by mouth 2 (two) times daily.   temazepam 30 MG capsule Commonly known as:  RESTORIL Take 30 mg by mouth at bedtime.   valsartan 320 MG tablet Commonly known as:  DIOVAN Take 320  mg by mouth daily.   Vitamin D (Ergocalciferol) 50000 units Caps capsule Commonly known as:  DRISDOL TAKE ONE CAPSULE BY MOUTH EVERY OTHER WEEK        Follow-up Information    [facility], [place] .   Specialty:  General Surgery Why:  Follow-up 04/19/16 at 2:30pm. You will see a nurse to get your staples removed. Please arrive no later than 2:15pm to register and fill out necessary paperwork. Contact information: [address] [place] [phone]          Signed: Jerrye Beavers, [facility] 04/12/2016, 1:06 PM Pager: [phone] Consults: [phone] Mon-Fri 7:00 am-4:30 pm Sat-Sun 7:00 am-11:30 am

## 2016-04-12 NOTE — Progress Notes (Signed)
  Echocardiogram 2D Echocardiogram has been performed.  [name] 04/12/2016, 1:06 PM

## 2016-04-12 NOTE — Evaluation (Signed)
Physical Therapy Evaluation Patient Details Name: [name] MRN: [id number] DOB: [birth year] Today's Date: 04/12/2016   History of Present Illness  65 yo hx dm,htn, obesity, diastolic dysfunction, gerd, dvt anemia recently had colonoscopy showing a 3 cm exophytic mass around the ileocecal valve.  Biopsy showed villous adenoma with high-grade dysplasia.  underwent laparoscopic assisted ascending colectomy  Clinical Impression  Patient presents with mild dyspnea on exertion, new confusion and hallucinations. Spouse concerned about taking pt home with this new onset of confusion. Pt seems to be aware of confusion but reports it is improving. Tolerated gait training and stair training without difficulty or LOB. Pt will have support from spouse at home. Pt does not require skilled therapy services as pt functioning at Mod I level. May need initial supervision until cognition returns to baseline. Discharge from therapy.     Follow Up Recommendations No PT follow up    Equipment Recommendations  None recommended by PT    Recommendations for Other Services       Precautions / Restrictions Precautions Precautions: None Restrictions Weight Bearing Restrictions: No      Mobility  Bed Mobility               General bed mobility comments: NT -- up in chair  Transfers Overall transfer level: Modified independent Equipment used: None             General transfer comment: Stood from chair without difficulty.   Ambulation/Gait Ambulation/Gait assistance: Modified independent (Device/Increase time) Ambulation Distance (Feet): 400 Feet Assistive device: None Gait Pattern/deviations: Step-through pattern;Decreased stride length   Gait velocity interpretation: Below normal speed for age/gender General Gait Details: Slow, steady gait. DOE. No LOB or difficulty.   Stairs Stairs: Yes Stairs assistance: Supervision Stair Management: Alternating pattern;One rail Right Number of  Stairs: 4 General stair comments: Good, safe technique.  Wheelchair Mobility    Modified Rankin (Stroke Patients Only)       Balance Overall balance assessment: Needs assistance Sitting-balance support: Feet supported;No upper extremity supported Sitting balance-Leahy Scale: Normal     Standing balance support: During functional activity Standing balance-Leahy Scale: Good                               Pertinent Vitals/Pain Pain Assessment: No/denies pain    Home Living Family/patient expects to be discharged to:: Private residence Living Arrangements: Spouse/significant other Available Help at Discharge: Family;Available 24 hours/day Type of Home: House Home Access: Stairs to enter Entrance Stairs-Rails: None Entrance Stairs-Number of Steps: 3 Home Layout: One level Home Equipment: Walker - 2 wheels;Cane - single point;Crutches;Bedside commode      Prior Function Level of Independence: Independent         Comments: Cares for mother who has dementia.     Hand Dominance   Dominant Hand: Right    Extremity/Trunk Assessment   Upper Extremity Assessment: Defer to OT evaluation;Overall WFL for tasks assessed           Lower Extremity Assessment: Overall WFL for tasks assessed      Cervical / Trunk Assessment: Normal  Communication   Communication: No difficulties  Cognition Arousal/Alertness: Awake/alert Behavior During Therapy: WFL for tasks assessed/performed Overall Cognitive Status: No family/caregiver present to determine baseline cognitive functioning (seems [facility] however pt also reports seeing some men blending in with the trees and laying on the floor outside. "men have been in the trees all day, wait til  they come down, they are HUGE.")                      General Comments General comments (skin integrity, edema, etc.): Spouse concerned about pt's cognitive status; she is confused and seeing things.     Exercises         Assessment/Plan    PT Assessment Patent does not need any further PT services  PT Diagnosis Difficulty walking   PT Problem List    PT Treatment Interventions     PT Goals (Current goals can be found in the Care Plan section) Acute Rehab PT Goals Patient Stated Goal: to go home PT Goal Formulation: All assessment and education complete, DC therapy Time For Goal Achievement: 04/26/16 Potential to Achieve Goals: Good    Frequency     Barriers to discharge        Co-evaluation               End of Session   Activity Tolerance: Patient tolerated treatment well Patient left: in chair;with call bell/phone within reach;with family/visitor present Nurse Communication: Mobility status         Time: 1140-1200 PT Time Calculation (min) (ACUTE ONLY): 20 min   Charges:   PT Evaluation $PT Eval Moderate Complexity: 1 Procedure     PT G Codes:        Darnell Jeschke A Yarielis Funaro 04/12/2016, 12:06 PM Mylo Red, PT, DPT [phone]

## 2016-04-12 NOTE — Progress Notes (Signed)
4 Days Post-Op  Subjective: Stable and alert. Did not sleep last night.  Has been ambulating in the halls most of the time. When I spoke to her in the hall this morning she knew that she was in [place] and said she was going to look for her husband. Saw Christmas trees in her room and water on the floor, so there may be some hallucination. When confronted about where she was, situation, she confabulates stating "of course I knew that"  Eating some of her soft diet.  No nausea or vomiting.  Says she had a bowel movement yesterday afternoon but none recorded on evening shift.  Underwent medical evaluation yesterday by Dr. Allyson Sabal  for some confusion and hallucinations..  Tachycardia.  Borderline hypoxia.    chest x-ray showed low lung volumes Artery Dopplers negative for DVT EKG showed sinus tachycardia but no acute change otherwise normal Questions arise about possible withdrawal from Valium Started on Metroprolol for sinus tachycardia Apparently echocardiogram ordered.  Temp 99 1.  Heart rate 105.  Respiratory rate 18 and unlabored.  BP 136/63.  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  Pleasant.  Does not appear in any physical distress.  Ambulating independently.  Has her makeup on.  Easily distracted. Resp: No wheeze or rhonchi.  Decreased breath sounds at bases SPO2 93% on room air, repeat just now 97% on room air. GI: Obese.  Soft.  Incisions clean.  Does not appear distended.  Minimally tender.  Fairly  benign exam for postop state.  Lab Results:  Results for orders placed or performed during the hospital encounter of 04/08/16 (from the past 24 hour(s))  Glucose, capillary     Status: Abnormal   Collection Time: 04/11/16  7:23 AM  Result Value Ref Range   Glucose-Capillary 295 (H) 65 - 99 mg/dL  TSH     Status: None   Collection Time: 04/11/16  8:27 AM  Result Value Ref Range   TSH 0.934 0.350 - 4.500 uIU/mL  Troponin I     Status: Abnormal   Collection Time: 04/11/16  8:36 AM  Result Value Ref Range   Troponin I 0.06 (HH) <0.03 ng/mL  Magnesium     Status: Abnormal   Collection Time: 04/11/16  8:36 AM  Result Value Ref Range   Magnesium 1.6 (L) 1.7 - 2.4 mg/dL  Glucose, capillary     Status: Abnormal   Collection Time: 04/11/16 11:30 AM  Result Value Ref Range   Glucose-Capillary 194 (H) 65 - 99 mg/dL  Urinalysis, Routine w reflex microscopic (not at [facility])     Status: Abnormal   Collection Time: 04/11/16 12:16 PM  Result Value Ref Range   Color, Urine YELLOW YELLOW   APPearance CLEAR CLEAR   Specific Gravity, Urine 1.016 1.005 - 1.030   pH 6.0 5.0 - 8.0   Glucose, UA >1000 (A) NEGATIVE mg/dL   Hgb urine dipstick NEGATIVE NEGATIVE   Bilirubin Urine NEGATIVE NEGATIVE   Ketones, ur 40 (A)  NEGATIVE mg/dL   Protein, ur NEGATIVE NEGATIVE mg/dL   Nitrite NEGATIVE NEGATIVE   Leukocytes, UA NEGATIVE NEGATIVE  Urine microscopic-add on     Status: Abnormal   Collection Time: 04/11/16 12:16 PM  Result Value Ref Range   Squamous Epithelial / LPF 0-5 (A) NONE SEEN   WBC, UA 0-5 0 - 5 WBC/hpf   RBC / HPF 0-5 0 - 5 RBC/hpf   Bacteria, UA RARE (A) NONE SEEN  Troponin I     Status: Abnormal   Collection Time: 04/11/16  2:46 PM  Result Value Ref Range   Troponin I 0.06 (HH) <0.03 ng/mL  Glucose, capillary     Status: Abnormal   Collection Time: 04/11/16  3:58 PM  Result Value Ref Range   Glucose-Capillary 274 (H) 65 - 99 mg/dL   Comment 1 Notify RN   Glucose, capillary      Status: Abnormal   Collection Time: 04/11/16  8:06 PM  Result Value Ref Range   Glucose-Capillary 223 (H) 65 - 99 mg/dL   Comment 1 Notify RN    Comment 2 Document in Chart   Glucose, capillary     Status: Abnormal   Collection Time: 04/12/16 12:14 AM  Result Value Ref Range   Glucose-Capillary 196 (H) 65 - 99 mg/dL   Comment 1 Notify RN    Comment 2 Document in Chart   Glucose, capillary     Status: Abnormal   Collection Time: 04/12/16  4:05 AM  Result Value Ref Range   Glucose-Capillary 115 (H) 65 - 99 mg/dL   Comment 1 Notify RN    Comment 2 Document in Chart      Studies/Results: Dg Chest 2 View  Result Date: 04/11/2016 CLINICAL DATA:  Hypoxia.  Confusion with hallucinations. EXAM: CHEST  2 VIEW COMPARISON:  01/15/2016 FINDINGS: Very low inspiratory lung volumes are seen. Lordotic positioning noted. No evidence of pulmonary opacity. Heart size is within normal limits allowing for low lung volumes. IMPRESSION: Very low lung volumes.  No acute findings. Electronically Signed   By: Earle Gell M.D.   On: 04/11/2016 09:19    . amLODipine  5 mg Oral Daily  . DULoxetine  60 mg Oral Daily  . enoxaparin (LOVENOX) injection  40 mg Subcutaneous Q24H  . insulin aspart  0-20 Units Subcutaneous Q4H  . insulin aspart protamine- aspart  28 Units Subcutaneous BID WC  . irbesartan  300 mg Oral Daily  . levothyroxine  137 mcg Oral QAC breakfast  . metoprolol tartrate  25 mg Oral Daily  . pantoprazole  40 mg Oral BID     Assessment/Plan: s/p Procedure(s): LAPAROSCOPIC ASSISTED ASCENDING COLECTOMY POSSIBLE OPEN COLECTOMY  POD #4. Laparoscopic lysis of adhesions, extensive, laparoscopic right colectomy. Stable.      Suspect nausea may be due to narcotics.  Narcotics discontinued      Soft diet      Possibly home tomorrow if she turns the corner Check pathology  Confusion, anxiety, hallucinations.  Etiology not clear.  Hypoxia and hypoglycemia essentially ruled out.  Sepsis  unlikely considering absence of fever or leukocytosis or toxicity.  Wonder about interaction of narcotics and Valium withdrawal and other medications.  Clinically stable surgically for discharge.  Not stable from a cognitive standpoint to go home.  Discussed with Baltazar Najjar at [facility] and they will reevaluate this morning.  Tachycardia.  EKG ordered by internal medicine shows sinus tachycardia, otherwise normal ECG.   Insulin-dependent diabetes mellitus. Continue sliding scale  insulin every 4 hours. Basal insulin as needed Target range of glucose is 120-180 and postop.  Hypertension. Controlled. Continue current oral medications.  VTE prophylaxis. Lovenox Appreciate [facility] intervention and monitoring. Continue  @PROBHOSP @  LOS: 4 days    Lealand Elting M 04/12/2016  . .prob

## 2016-04-12 NOTE — Progress Notes (Signed)
Discussed discharge summary with patient. Reviewed all medications with patient. Patient received Rx. Patient Echo complete. Patient waiting for husband to pick her up. Patient ready for discharge.

## 2016-04-13 NOTE — Progress Notes (Signed)
Inform patient of Pathology report,.  Tell her that the final pathology on the colon polyp did not show cancer.  It was a precancerous polyp, as discussed.  No further treatment will be necessary other than the surgery.  Tell her that I will discuss this with her in detail at her next office visit. Thanks.   Edsel Petrin. Dalbert Batman, M.D., [facility]

## 2016-04-22 DIAGNOSIS — E039 Hypothyroidism, unspecified: Secondary | ICD-10-CM | POA: Diagnosis not present

## 2016-04-22 DIAGNOSIS — K219 Gastro-esophageal reflux disease without esophagitis: Secondary | ICD-10-CM | POA: Diagnosis not present

## 2016-04-22 DIAGNOSIS — F325 Major depressive disorder, single episode, in full remission: Secondary | ICD-10-CM | POA: Diagnosis not present

## 2016-04-22 DIAGNOSIS — G4700 Insomnia, unspecified: Secondary | ICD-10-CM | POA: Diagnosis not present

## 2016-04-22 DIAGNOSIS — I10 Essential (primary) hypertension: Secondary | ICD-10-CM | POA: Diagnosis not present

## 2016-05-03 DIAGNOSIS — L299 Pruritus, unspecified: Secondary | ICD-10-CM | POA: Diagnosis not present

## 2016-05-03 DIAGNOSIS — Z794 Long term (current) use of insulin: Secondary | ICD-10-CM | POA: Diagnosis not present

## 2016-05-03 DIAGNOSIS — Z23 Encounter for immunization: Secondary | ICD-10-CM | POA: Diagnosis not present

## 2016-05-03 DIAGNOSIS — E1165 Type 2 diabetes mellitus with hyperglycemia: Secondary | ICD-10-CM | POA: Diagnosis not present

## 2016-05-03 DIAGNOSIS — E039 Hypothyroidism, unspecified: Secondary | ICD-10-CM | POA: Diagnosis not present

## 2016-05-15 DIAGNOSIS — E1165 Type 2 diabetes mellitus with hyperglycemia: Secondary | ICD-10-CM | POA: Diagnosis not present

## 2016-05-15 DIAGNOSIS — Z794 Long term (current) use of insulin: Secondary | ICD-10-CM | POA: Diagnosis not present

## 2016-05-15 DIAGNOSIS — J988 Other specified respiratory disorders: Secondary | ICD-10-CM | POA: Diagnosis not present

## 2016-05-21 DIAGNOSIS — B349 Viral infection, unspecified: Secondary | ICD-10-CM | POA: Diagnosis not present

## 2016-05-21 DIAGNOSIS — J0100 Acute maxillary sinusitis, unspecified: Secondary | ICD-10-CM | POA: Diagnosis not present

## 2016-06-14 DIAGNOSIS — R52 Pain, unspecified: Secondary | ICD-10-CM | POA: Diagnosis not present

## 2016-06-14 DIAGNOSIS — B9689 Other specified bacterial agents as the cause of diseases classified elsewhere: Secondary | ICD-10-CM | POA: Diagnosis not present

## 2016-06-14 DIAGNOSIS — J019 Acute sinusitis, unspecified: Secondary | ICD-10-CM | POA: Diagnosis not present

## 2016-07-11 ENCOUNTER — Other Ambulatory Visit: Payer: Self-pay | Admitting: Family Medicine

## 2016-07-11 DIAGNOSIS — Z1231 Encounter for screening mammogram for malignant neoplasm of breast: Secondary | ICD-10-CM

## 2016-07-12 DIAGNOSIS — L700 Acne vulgaris: Secondary | ICD-10-CM | POA: Diagnosis not present

## 2016-07-12 DIAGNOSIS — L280 Lichen simplex chronicus: Secondary | ICD-10-CM | POA: Diagnosis not present

## 2016-07-12 DIAGNOSIS — L218 Other seborrheic dermatitis: Secondary | ICD-10-CM | POA: Diagnosis not present

## 2016-07-20 DIAGNOSIS — E113391 Type 2 diabetes mellitus with moderate nonproliferative diabetic retinopathy without macular edema, right eye: Secondary | ICD-10-CM | POA: Diagnosis not present

## 2016-07-20 DIAGNOSIS — E113492 Type 2 diabetes mellitus with severe nonproliferative diabetic retinopathy without macular edema, left eye: Secondary | ICD-10-CM | POA: Diagnosis not present

## 2016-07-20 DIAGNOSIS — H3582 Retinal ischemia: Secondary | ICD-10-CM | POA: Diagnosis not present

## 2016-08-05 DIAGNOSIS — J0100 Acute maxillary sinusitis, unspecified: Secondary | ICD-10-CM | POA: Diagnosis not present

## 2016-08-05 DIAGNOSIS — M5136 Other intervertebral disc degeneration, lumbar region: Secondary | ICD-10-CM | POA: Diagnosis not present

## 2016-08-08 ENCOUNTER — Emergency Department (HOSPITAL_COMMUNITY): Payer: Medicare Other

## 2016-08-08 ENCOUNTER — Emergency Department (HOSPITAL_COMMUNITY)
Admission: EM | Admit: 2016-08-08 | Discharge: 2016-08-08 | Disposition: A | Discharge status: Home / Self Care | Payer: Medicare Other | Attending: Emergency Medicine | Admitting: Emergency Medicine

## 2016-08-08 ENCOUNTER — Encounter (HOSPITAL_COMMUNITY): Payer: Self-pay | Admitting: *Deleted

## 2016-08-08 DIAGNOSIS — R05 Cough: Secondary | ICD-10-CM | POA: Diagnosis not present

## 2016-08-08 DIAGNOSIS — Z794 Long term (current) use of insulin: Secondary | ICD-10-CM | POA: Diagnosis not present

## 2016-08-08 DIAGNOSIS — E119 Type 2 diabetes mellitus without complications: Secondary | ICD-10-CM | POA: Insufficient documentation

## 2016-08-08 DIAGNOSIS — R42 Dizziness and giddiness: Secondary | ICD-10-CM | POA: Diagnosis not present

## 2016-08-08 DIAGNOSIS — R0602 Shortness of breath: Secondary | ICD-10-CM | POA: Diagnosis present

## 2016-08-08 DIAGNOSIS — Z7982 Long term (current) use of aspirin: Secondary | ICD-10-CM | POA: Insufficient documentation

## 2016-08-08 DIAGNOSIS — J40 Bronchitis, not specified as acute or chronic: Secondary | ICD-10-CM | POA: Diagnosis not present

## 2016-08-08 DIAGNOSIS — Z96653 Presence of artificial knee joint, bilateral: Secondary | ICD-10-CM | POA: Insufficient documentation

## 2016-08-08 DIAGNOSIS — R1012 Left upper quadrant pain: Secondary | ICD-10-CM | POA: Diagnosis not present

## 2016-08-08 DIAGNOSIS — R109 Unspecified abdominal pain: Secondary | ICD-10-CM | POA: Diagnosis not present

## 2016-08-08 DIAGNOSIS — E039 Hypothyroidism, unspecified: Secondary | ICD-10-CM | POA: Insufficient documentation

## 2016-08-08 DIAGNOSIS — I10 Essential (primary) hypertension: Secondary | ICD-10-CM | POA: Insufficient documentation

## 2016-08-08 DIAGNOSIS — R079 Chest pain, unspecified: Secondary | ICD-10-CM | POA: Diagnosis not present

## 2016-08-08 DIAGNOSIS — R030 Elevated blood-pressure reading, without diagnosis of hypertension: Secondary | ICD-10-CM | POA: Diagnosis not present

## 2016-08-08 DIAGNOSIS — R0789 Other chest pain: Secondary | ICD-10-CM | POA: Diagnosis not present

## 2016-08-08 LAB — BASIC METABOLIC PANEL
ANION GAP: 11 (ref 5–15)
BUN: 12 mg/dL (ref 6–20)
CO2: 23 mmol/L (ref 22–32)
Calcium: 9.7 mg/dL (ref 8.9–10.3)
Chloride: 101 mmol/L (ref 101–111)
Creatinine, Ser: 1.05 mg/dL — ABNORMAL HIGH (ref 0.44–1.00)
GFR calc Af Amer: >60 mL/min (ref >60)
GFR, EST NON AFRICAN AMERICAN: 54 mL/min — AB (ref >60)
GLUCOSE: 348 mg/dL — AB (ref 65–99)
POTASSIUM: 4.1 mmol/L (ref 3.5–5.1)
Sodium: 135 mmol/L (ref 135–145)

## 2016-08-08 LAB — CBC
HEMATOCRIT: 32.3 % — AB (ref 36.0–46.0)
Hemoglobin: 10.1 g/dL — ABNORMAL LOW (ref 12.0–15.0)
MCH: 20.8 pg — ABNORMAL LOW (ref 26.0–34.0)
MCHC: 31.3 g/dL (ref 30.0–36.0)
MCV: 66.6 fL — ABNORMAL LOW (ref 78.0–100.0)
Platelets: 276 10*3/uL (ref 150–400)
RBC: 4.85 MIL/uL (ref 3.87–5.11)
RDW: 15.4 % (ref 11.5–15.5)
WBC: 4.9 10*3/uL (ref 4.0–10.5)

## 2016-08-08 LAB — I-STAT CG4 LACTIC ACID, ED: Lactic Acid, Venous: 2.37 mmol/L (ref 0.5–1.9)

## 2016-08-08 LAB — URINALYSIS, ROUTINE W REFLEX MICROSCOPIC
Bilirubin Urine: NEGATIVE
Hgb urine dipstick: NEGATIVE
Ketones, ur: 20 mg/dL — AB
Leukocytes, UA: NEGATIVE
Nitrite: NEGATIVE
Protein, ur: 30 mg/dL — AB
SQUAMOUS EPITHELIAL / LPF: NONE SEEN
Specific Gravity, Urine: 1.017 (ref 1.005–1.030)
pH: 7 (ref 5.0–8.0)

## 2016-08-08 MED ORDER — SODIUM CHLORIDE 0.9 % IV BOLUS (SEPSIS)
1000.0000 mL | Freq: Once | INTRAVENOUS | Status: AC
  Administered 2016-08-08: 1000 mL via INTRAVENOUS

## 2016-08-08 MED ORDER — BENZONATATE 100 MG PO CAPS: 100.0000 mg | ORAL_CAPSULE | Freq: Three times a day (TID) | ORAL | 0 refills | Status: DC

## 2016-08-08 MED ORDER — DIPHENHYDRAMINE HCL 50 MG/ML IJ SOLN
25.0000 mg | Freq: Once | INTRAMUSCULAR | Status: AC
  Administered 2016-08-08: 25 mg via INTRAVENOUS
  Filled 2016-08-08: qty 1

## 2016-08-08 MED ORDER — IOPAMIDOL (ISOVUE-300) INJECTION 61%
INTRAVENOUS | Status: AC
  Administered 2016-08-08: 100 mL
  Filled 2016-08-08: qty 100

## 2016-08-08 MED ORDER — IPRATROPIUM-ALBUTEROL 0.5-2.5 (3) MG/3ML IN SOLN
3.0000 mL | Freq: Once | RESPIRATORY_TRACT | Status: AC
  Administered 2016-08-08: 3 mL via RESPIRATORY_TRACT
  Filled 2016-08-08: qty 3

## 2016-08-08 MED ORDER — KETOROLAC TROMETHAMINE 30 MG/ML IJ SOLN
15.0000 mg | Freq: Once | INTRAMUSCULAR | Status: AC
  Administered 2016-08-08: 15 mg via INTRAVENOUS
  Filled 2016-08-08: qty 1

## 2016-08-08 MED ORDER — ALBUTEROL SULFATE HFA 108 (90 BASE) MCG/ACT IN AERS: 2.0000 | INHALATION_SPRAY | RESPIRATORY_TRACT | 0 refills | Status: DC | PRN

## 2016-08-08 MED ORDER — PROMETHAZINE HCL 25 MG/ML IJ SOLN
12.5000 mg | Freq: Once | INTRAMUSCULAR | Status: AC
  Administered 2016-08-08: 12.5 mg via INTRAVENOUS
  Filled 2016-08-08: qty 1

## 2016-08-08 NOTE — ED Triage Notes (Signed)
To ED for eval of increased sob, vomiting, flank-abd pain, cp. Recently dx with pneumonia and sinus infection. Almost finished with z-pak. Pt appears to not feel well. Pale. Vomiting in triage.

## 2016-08-08 NOTE — Discharge Instructions (Signed)
There were signs of bronchitis on chest x-ray. Use Tessalon as needed for cough. Albuterol as needed for wheezing or shortness of breath. Finish the azithromycin, as previously prescribed.  There are also signs of dehydration on the lab work. It is essential that you stay well-hydrated.   Follow-up with your primary care provider on these issues. Return to the ED should any symptoms worsen.

## 2016-08-08 NOTE — ED Provider Notes (Signed)
[facility] DEPT Provider Note   CSN: [id number] Arrival date & time: 08/08/16  1608     History   Chief Complaint Chief Complaint  Patient presents with  . Shortness of Breath  . Chest Pain  . Flank Pain    HPI [name] is a 66 y.o. female.  HPI    [name] is a 66 y.o. female, with a history of Anemia, Diverticulitis, DM, and HTN, presenting to the ED with shortness of breath and nonproductive cough beginning a week ago. Seen at her PCP on 12/29, diagnosed with "walking pneumonia." Prescribed azithromycin. Scheduled to take the last dose tomorrow. Endorses subjective fever.   Also complains of left flank pain beginning 2-3 days ago, severely increased in intensity this afternoon. Pain is sharp/aching, 9/10, radiates down into the LLQ. Pt voices a history of recurrent UTIs and renal stones. Pt began vomiting while in the ED waiting room.   Denies diarrhea, constipation, urinary complaints, or any other complaints.  States zofran doesn't work for her vomiting, but phenergan does.   Past Medical History:  Diagnosis Date  . Anemia    iron deficiency  . Arthritis   . Blood transfusion   . Depression   . Diabetes mellitus   . Diastolic dysfunction   . Diverticulitis   . DJD (degenerative joint disease)   . DVT (deep venous thrombosis) (HCC)    times 2 lower leg  . Endometriosis   . Family history of adverse reaction to anesthesia    mom and dad PONV  . Fibromyalgia   . GERD (gastroesophageal reflux disease)    occ  . Hiatal hernia   . Hyperlipidemia   . Hypertension   . Hypothyroidism   . Insomnia   . Kidney stones   . Mild aortic insufficiency    by echo 04/2013  . Osteoarthritis    back and knee  . Ovarian cyst   . PONV (postoperative nausea and vomiting)   . RLS (restless legs syndrome)   . Thoracic compression fracture ([place])   . Uterine fibroid   . UTI (lower urinary tract infection)   . Villous adenoma of right colon 04/08/2016  . Vitamin  D deficiency disease     Patient Active Problem List   Diagnosis Date Noted  . Hypoxia   . Villous adenoma of right colon 04/08/2016  . Leukocytosis 04/08/2016  . Anemia   . Diastolic dysfunction   . Mild aortic insufficiency   . Other nonspecific abnormal cardiovascular system function study 05/03/2013  . SOB (shortness of breath) 05/01/2013  . Atypical chest pain 02/28/2013  . Dysphagia 02/28/2013  . UTI (lower urinary tract infection) 07/06/2012  . Intractable nausea and vomiting 07/06/2012  . Blood loss anemia 07/06/2012  . Acute kidney injury ([place]) 07/06/2012  . Epigastric abdominal pain 06/27/2012  . Type II diabetes mellitus ([place]) 07/28/2011  . Hypothyroidism 07/28/2011  . Hypertension 07/28/2011  . Hepatic steatosis 07/28/2011    Past Surgical History:  Procedure Laterality Date  . APPENDECTOMY    . BACK SURGERY  ,2005   April  2013 - spinal fusion[facility]  . BREAST SURGERY     breast reduction  . CARDIAC CATHETERIZATION  04/2013   normal coronary arteries and normal LVF  . CARPAL TUNNEL RELEASE  06/05/2012   Procedure: CARPAL TUNNEL RELEASE;  Surgeon: Cammie Sickle., MD;  Location: [facility];  Service: Orthopedics;  Laterality: Left;  . CESAREAN SECTION  x 2  . CHOLECYSTECTOMY    . COLECTOMY  04/08/2016  . DORSAL COMPARTMENT RELEASE  06/05/2012   Procedure: RELEASE DORSAL COMPARTMENT (DEQUERVAIN);  Surgeon: Cammie Sickle., MD;  Location: [facility];  Service: Orthopedics;  Laterality: Left;  Excision of mixoid cyst also  . EYE SURGERY     cataracts bilateral  . KNEE ARTHROPLASTY  09   lft partial  . KNEE ARTHROPLASTY     rt  . LAPAROSCOPIC PARTIAL COLECTOMY N/A 04/08/2016   Procedure: LAPAROSCOPIC ASSISTED ASCENDING COLECTOMY POSSIBLE OPEN COLECTOMY;  Surgeon: Fanny Skates, MD;  Location: [facility];  Service: General;  Laterality: N/A;  . orthopedic surgeries     multiple  . SHOULDER OPEN ROTATOR CUFF REPAIR     rt  and lft  . TUBAL LIGATION     btsp    OB History    Gravida Para Term Preterm AB Living   2 2 2     2    SAB TAB Ectopic Multiple Live Births                   Home Medications    Prior to Admission medications   Medication Sig Start Date End Date Taking? Authorizing Provider  amLODipine (NORVASC) 5 MG tablet Take 5 mg by mouth daily.   Yes Historical Provider, MD  aspirin 81 MG chewable tablet Chew 81 mg by mouth daily.   Yes Historical Provider, MD  azithromycin (ZITHROMAX) 250 MG tablet Take 250-500 mg by mouth See admin instructions. Started 12/29/17Take 2 tablets (500 mg) by mouth 1st day, then take 1 tablet (250 mg) on days 2-5 08/05/16  Yes Historical Provider, MD  clindamycin (CLEOCIN T) 1 % lotion Apply 1 application topically 2 (two) times daily as needed (rash/ itching).  07/13/16  Yes Historical Provider, MD  clobetasol (TEMOVATE) 0.05 % external solution Apply 1 application topically See admin instructions. Apply twice daily to scalp and ear as needed for itching/rash (do not apply to face/groin/underarms) 07/13/16  Yes Historical Provider, MD  Cyanocobalamin (VITAMIN B-12 PO) Take 1 tablet by mouth daily.   Yes Historical Provider, MD  diazepam (VALIUM) 2 MG tablet Take 0.5 tablets (1 mg total) by mouth every 12 (twelve) hours as needed for anxiety or muscle spasms. Patient taking differently: Take 1 mg by mouth every 12 (twelve) hours as needed for anxiety.  04/12/16  Yes Reyne Dumas, MD  doxycycline (VIBRA-TABS) 100 MG tablet Take 100 mg by mouth See admin instructions. #60 filled 07/13/16 - Take 1 tablet (100 mg) by mouth twice daily with food and water until rash is cleared, resume if rash returns 07/13/16  Yes Historical Provider, MD  DULoxetine (CYMBALTA) 60 MG capsule Take 60 mg by mouth daily.   Yes Historical Provider, MD  fluticasone (FLONASE) 50 MCG/ACT nasal spray Place 2 sprays into both nostrils daily as needed for allergies or rhinitis.   Yes Historical Provider, MD   furosemide (LASIX) 20 MG tablet Take 20-40 mg by mouth daily as needed for fluid or edema.    Yes Historical Provider, MD  HYDROcodone-acetaminophen (NORCO/VICODIN) 5-325 MG per tablet Take 1 tablet by mouth every 4 (four) hours as needed for moderate pain or severe pain. 10/29/14  Yes Antonietta Breach, PA-C  ibuprofen (ADVIL,MOTRIN) 200 MG tablet Take 400 mg by mouth every 6 (six) hours as needed for headache (pain).   Yes Historical Provider, MD  insulin NPH-regular Human (NOVOLIN 70/30) (70-30) 100 UNIT/ML injection Inject 50  Units into the skin 2 (two) times daily before a meal.   Yes Historical Provider, MD  levothyroxine (SYNTHROID, LEVOTHROID) 137 MCG tablet Take 137 mcg by mouth daily before breakfast.   Yes Historical Provider, MD  pantoprazole (PROTONIX) 40 MG tablet Take 40 mg by mouth 2 (two) times daily.   Yes Historical Provider, MD  temazepam (RESTORIL) 30 MG capsule Take 30 mg by mouth at bedtime.    Yes Historical Provider, MD  traMADol (ULTRAM) 50 MG tablet Take 50-100 mg by mouth every 6 (six) hours as needed (pain).  08/05/16  Yes Historical Provider, MD  valsartan (DIOVAN) 320 MG tablet Take 320 mg by mouth daily.   Yes Historical Provider, MD  albuterol (PROVENTIL HFA;VENTOLIN HFA) 108 (90 Base) MCG/ACT inhaler Inhale 2 puffs into the lungs every 4 (four) hours as needed for wheezing or shortness of breath. 08/08/16   Shawn C Joy, PA-C  benzonatate (TESSALON) 100 MG capsule Take 1 capsule (100 mg total) by mouth every 8 (eight) hours. 08/08/16   Shawn C Joy, PA-C  insulin aspart protamine- aspart (NOVOLOG MIX 70/30) (70-30) 100 UNIT/ML injection Inject 0.3 mLs (30 Units total) into the skin 2 (two) times daily with a meal. Patient not taking: Reported on 08/08/2016 04/12/16   Reyne Dumas, MD  metoprolol tartrate (LOPRESSOR) 25 MG tablet Take 0.5 tablets (12.5 mg total) by mouth daily. Patient not taking: Reported on 08/08/2016 04/12/16   Reyne Dumas, MD  Vitamin D, Ergocalciferol, (DRISDOL)  50000 UNITS CAPS capsule TAKE ONE CAPSULE BY MOUTH EVERY OTHER WEEK Patient not taking: Reported on 08/08/2016 04/22/13   Regina Eck, CNM    Family History Family History  Problem Relation Age of Onset  . Heart attack Father     70s  . Hypothyroidism Father   . Diabetes type II Father   . Diabetes type II Mother   . Hypertension Mother   . Breast cancer Mother 11  . Hypothyroidism Brother     Social History Social History  Substance Use Topics  . Smoking status: Never Smoker  . Smokeless tobacco: Never Used  . Alcohol use No     Allergies   Codeine; Ambien [zolpidem tartrate]; Crestor [rosuvastatin]; Lipitor [atorvastatin]; Morphine and related; Penicillins; Statins; Dilaudid [hydromorphone hcl]; Amitriptyline; Ceftin [cefuroxime axetil]; Lovaza [omega-3-acid ethyl esters]; Lunesta [eszopiclone]; Lyrica [pregabalin]; and Metformin and related   Review of Systems Review of Systems  Constitutional: Positive for fever (subjective). Negative for chills and diaphoresis.  Respiratory: Positive for cough and shortness of breath.   Cardiovascular: Negative for chest pain and leg swelling.  Gastrointestinal: Positive for nausea and vomiting (single episode). Negative for blood in stool.  All other systems reviewed and are negative.    Physical Exam Updated Vital Signs BP 172/76 (BP Location: Left Arm)   Pulse 83   Temp 98.4 F (36.9 C) (Oral)   Resp 20   Ht 5\' 3"  (1.6 m)   Wt 88.9 kg   SpO2 100%   BMI 34.72 kg/m   Physical Exam  Constitutional: She appears well-developed and well-nourished. No distress.  HENT:  Head: Normocephalic and atraumatic.  Eyes: Conjunctivae are normal.  Neck: Neck supple.  Cardiovascular: Normal rate, regular rhythm, normal heart sounds and intact distal pulses.   Pulmonary/Chest: She has wheezes in the right lower field.  Increased work of breathing. Speaks in full sentences, but has to pause to rest in between.  Abdominal: Soft.  There is tenderness in the left upper quadrant and  left lower quadrant. There is CVA tenderness (left). There is no guarding.  Musculoskeletal: She exhibits no edema.  Lymphadenopathy:    She has no cervical adenopathy.  Neurological: She is alert.  Skin: Skin is warm and dry. She is not diaphoretic.  Psychiatric: She has a normal mood and affect. Her behavior is normal.  Nursing note and vitals reviewed.    ED Treatments / Results  Labs (all labs ordered are listed, but only abnormal results are displayed) Labs Reviewed  URINALYSIS, ROUTINE W REFLEX MICROSCOPIC - Abnormal; Notable for the following:       Result Value   Glucose, UA >=500 (*)    Ketones, ur 20 (*)    Protein, ur 30 (*)    Bacteria, UA RARE (*)    All other components within normal limits  BASIC METABOLIC PANEL - Abnormal; Notable for the following:    Glucose, Bld 348 (*)    Creatinine, Ser 1.05 (*)    GFR calc non Af Amer 54 (*)    All other components within normal limits  CBC - Abnormal; Notable for the following:    Hemoglobin 10.1 (*)    HCT 32.3 (*)    MCV 66.6 (*)    MCH 20.8 (*)    All other components within normal limits  I-STAT CG4 LACTIC ACID, ED - Abnormal; Notable for the following:    Lactic Acid, Venous 2.37 (*)    All other components within normal limits   Hemoglobin  Date Value Ref Range Status  08/08/2016 10.1 (L) 12.0 - 15.0 g/dL Final  04/11/2016 8.6 (L) 12.0 - 15.0 g/dL Final  04/10/2016 8.3 (L) 12.0 - 15.0 g/dL Final  04/09/2016 9.0 (L) 12.0 - 15.0 g/dL Final    EKG  EKG Interpretation  Date/Time:  Monday August 08 2016 16:36:02 EST Ventricular Rate:  83 PR Interval:  178 QRS Duration: 80 QT Interval:  416 QTC Calculation: 488 R Axis:   -14 Text Interpretation:  Normal sinus rhythm Possible Anterior infarct , age undetermined Abnormal ECG Confirmed by Alvino Chapel  MD, Ovid Curd [phone]) on 08/08/2016 7:08:17 PM Also confirmed by Alvino Chapel  MD, NATHAN [phone]), editor Lorenda Cahill CT,  Leda Gauze [phone])  on 08/09/2016 7:28:13 AM       Radiology Dg Chest 2 View  Result Date: 08/08/2016 CLINICAL DATA:  Cough x2 days. Left-sided posterior chest pain and axillary pain. EXAM: CHEST  2 VIEW COMPARISON:  04/11/2016 CXR, lumbar spine CT 10/22/2013 FINDINGS: Heart size is borderline enlarged. The thoracic aorta is atherosclerotic. No pneumonic consolidation nor effusion. No pneumothorax. Mild interstitial prominence bilaterally may reflect bronchitic change. No suspicious osseous abnormalities. Cholecystectomy. Mild anterior chronic compression of T12. IMPRESSION: Mild diffuse interstitial prominence which may reflect bronchitic change. Chronic mild anterior T12 compression. Electronically Signed   By: Ashley Royalty M.D.   On: 08/08/2016 18:17   Ct Abdomen Pelvis W Contrast  Result Date: 08/08/2016 CLINICAL DATA:  Left-sided flank pain and back pain since this afternoon. EXAM: CT ABDOMEN AND PELVIS WITH CONTRAST TECHNIQUE: Multidetector CT imaging of the abdomen and pelvis was performed using the standard protocol following bolus administration of intravenous contrast. CONTRAST:  1110mL ISOVUE-300 IOPAMIDOL (ISOVUE-300) INJECTION 61% COMPARISON:  03/28/2016 FINDINGS: Lower chest: Streaky right basilar atelectasis or early infiltrate. No worrisome pulmonary lesions. No pleural effusion. The heart is normal in size. No pericardial effusion. The distal esophagus is grossly normal. Hepatobiliary: No focal hepatic lesions to suggest metastatic disease. Gallbladder is surgically absent. No biliary dilatation. Pancreas:  No mass, inflammation or ductal dilatation. Moderate-sized duodenum diverticulum noted near the pancreatic head. Spleen: Normal size.  Small low-attenuation lesion is stable. Adrenals/Urinary Tract: The adrenal glands and kidneys are unremarkable and stable. No worrisome renal lesions or hydronephrosis. Stomach/Bowel: The stomach, duodenum, small bowel and colon are grossly normal without oral  contrast. Surgical changes involving the right colon with a partial right colectomy and ileal colonic anastomosis. No complicating features. Vascular/Lymphatic: Scattered aortic calcifications but no aneurysm or dissection. The branch vessels are patent. The major venous structures are patent. No mesenteric or retroperitoneal mass or lymphadenopathy. Reproductive: The uterus and ovaries are normal. Other: No pelvic mass or adenopathy. No free pelvic fluid collections. No inguinal mass or adenopathy. No abdominal wall hernia or subcutaneous lesions. Musculoskeletal: No significant bony findings. Extensive surgical changes involving the lumbar spine. IMPRESSION: 1. No acute abdominal/pelvic findings, mass lesions or lymphadenopathy. 2. Surgical changes from a partial right colectomy. No complicating features or recurrent tumor. 3. No renal, ureteral or bladder calculi or mass. Electronically Signed   By: Marijo Sanes M.D.   On: 08/08/2016 20:52    Procedures Procedures (including critical care time)  Medications Ordered in ED Medications  promethazine (PHENERGAN) injection 12.5 mg (12.5 mg Intravenous Given 08/08/16 1759)  sodium chloride 0.9 % bolus 1,000 mL (0 mLs Intravenous Stopped 08/08/16 1903)  ketorolac (TORADOL) 30 MG/ML injection 15 mg (15 mg Intravenous Given 08/08/16 1759)  ipratropium-albuterol (DUONEB) 0.5-2.5 (3) MG/3ML nebulizer solution 3 mL (3 mLs Nebulization Given 08/08/16 1822)  iopamidol (ISOVUE-300) 61 % injection (100 mLs  Contrast Given 08/08/16 2016)  diphenhydrAMINE (BENADRYL) injection 25 mg (25 mg Intravenous Given 08/08/16 1938)     Initial Impression / Assessment and Plan / ED Course  I have reviewed the triage vital signs and the nursing notes.  Pertinent labs & imaging results that were available during my care of the patient were reviewed by me and considered in my medical decision making (see chart for details).  Clinical Course    Patient presents with a cough as well  as flank and abdominal pain. Possible bronchitis on chest x-ray. No acute abnormalities on abdominal CT. Patient is nontoxic appearing, afebrile, not tachycardic, not tachypneic, not hypotensive, maintains adequate SPO2 on room air, and is in no apparent distress. Patient has no signs of sepsis or other serious or life-threatening condition. Patient's hypertension is noted. She has no symptoms of hypertensive emergency or end organ damage. All the patient's symptoms resolved with the treatment provided here in the ED. The patient was given instructions for home care as well as return precautions. Patient voices understanding of these instructions, accepts the plan, and is comfortable with discharge.   Findings and plan of care discussed with Davonna Belling, MD. Dr. Alvino Chapel personally evaluated and examined this patient.    Vitals:   08/08/16 1832 08/08/16 1845 08/08/16 1900 08/08/16 1903  BP: 194/84 184/86 (!) 200/101   Pulse: 90 99 109   Resp: 15 17 23    Temp:    97.6 F (36.4 C)  TempSrc:    Rectal  SpO2: 100% 99% 96%   Weight:      Height:       Vitals:   08/08/16 2000 08/08/16 2205 08/08/16 2215 08/08/16 2220  BP: 168/73 196/90 185/84   Pulse: 111 104 98   Resp: 21 16 10    Temp:    98.1 F (36.7 C)  TempSrc:    Oral  SpO2: 94% 98% 100%   Weight:  Height:         Final Clinical Impressions(s) / ED Diagnoses   Final diagnoses:  Bronchitis    New Prescriptions Discharge Medication List as of 08/08/2016 10:02 PM    START taking these medications   Details  albuterol (PROVENTIL HFA;VENTOLIN HFA) 108 (90 Base) MCG/ACT inhaler Inhale 2 puffs into the lungs every 4 (four) hours as needed for wheezing or shortness of breath., Starting Mon 08/08/2016, Print    benzonatate (TESSALON) 100 MG capsule Take 1 capsule (100 mg total) by mouth every 8 (eight) hours., Starting Mon 08/08/2016, Print         Lorayne Bender, PA-C 08/09/16 [place], MD 08/10/16  0127

## 2016-08-08 NOTE — ED Notes (Signed)
Patient transported to x-ray. ?

## 2016-08-08 NOTE — ED Notes (Signed)
MD notified; pt informed this is a side effect of phenergan and offered benadryl.  Pt opting for benadryl.

## 2016-08-08 NOTE — ED Notes (Signed)
Patient returned from xray.

## 2016-08-08 NOTE — ED Notes (Signed)
Patient transported to CT 

## 2016-08-08 NOTE — ED Notes (Signed)
Pt sts after she got "those shots" (phenergan and toradol), her legs have been "jerking all over the bed" and they feel like they're vibrating.

## 2016-08-16 ENCOUNTER — Ambulatory Visit
Admission: RE | Admit: 2016-08-16 | Discharge: 2016-08-16 | Disposition: A | Discharge status: Home / Self Care | Payer: Medicare Other | Source: Ambulatory Visit | Attending: Family Medicine | Admitting: Family Medicine

## 2016-08-16 DIAGNOSIS — Z1231 Encounter for screening mammogram for malignant neoplasm of breast: Secondary | ICD-10-CM | POA: Diagnosis not present

## 2016-08-30 DIAGNOSIS — M549 Dorsalgia, unspecified: Secondary | ICD-10-CM | POA: Diagnosis not present

## 2016-08-30 DIAGNOSIS — M25559 Pain in unspecified hip: Secondary | ICD-10-CM | POA: Diagnosis not present

## 2016-08-30 DIAGNOSIS — M545 Low back pain: Secondary | ICD-10-CM | POA: Diagnosis not present

## 2016-08-30 DIAGNOSIS — M4726 Other spondylosis with radiculopathy, lumbar region: Secondary | ICD-10-CM | POA: Diagnosis not present

## 2016-08-30 DIAGNOSIS — M5124 Other intervertebral disc displacement, thoracic region: Secondary | ICD-10-CM | POA: Diagnosis not present

## 2016-09-01 ENCOUNTER — Other Ambulatory Visit: Payer: Self-pay | Admitting: Orthopaedic Surgery

## 2016-09-01 DIAGNOSIS — M5124 Other intervertebral disc displacement, thoracic region: Secondary | ICD-10-CM

## 2016-09-06 ENCOUNTER — Ambulatory Visit
Admission: RE | Admit: 2016-09-06 | Discharge: 2016-09-06 | Disposition: A | Discharge status: Home / Self Care | Payer: Medicare Other | Source: Ambulatory Visit | Attending: Orthopaedic Surgery | Admitting: Orthopaedic Surgery

## 2016-09-06 DIAGNOSIS — M5126 Other intervertebral disc displacement, lumbar region: Secondary | ICD-10-CM | POA: Diagnosis not present

## 2016-09-06 DIAGNOSIS — M5124 Other intervertebral disc displacement, thoracic region: Secondary | ICD-10-CM

## 2016-09-25 DIAGNOSIS — B349 Viral infection, unspecified: Secondary | ICD-10-CM | POA: Diagnosis not present

## 2016-09-25 DIAGNOSIS — R05 Cough: Secondary | ICD-10-CM | POA: Diagnosis not present

## 2016-10-05 DIAGNOSIS — M4316 Spondylolisthesis, lumbar region: Secondary | ICD-10-CM | POA: Diagnosis not present

## 2016-10-05 DIAGNOSIS — M4716 Other spondylosis with myelopathy, lumbar region: Secondary | ICD-10-CM | POA: Diagnosis not present

## 2016-10-05 DIAGNOSIS — M545 Low back pain: Secondary | ICD-10-CM | POA: Diagnosis not present

## 2016-10-06 DIAGNOSIS — Z9889 Other specified postprocedural states: Secondary | ICD-10-CM | POA: Diagnosis not present

## 2016-10-06 DIAGNOSIS — S4991XA Unspecified injury of right shoulder and upper arm, initial encounter: Secondary | ICD-10-CM | POA: Diagnosis not present

## 2016-10-07 ENCOUNTER — Other Ambulatory Visit: Payer: Self-pay | Admitting: Orthopedic Surgery

## 2016-10-07 DIAGNOSIS — R52 Pain, unspecified: Secondary | ICD-10-CM

## 2016-10-07 DIAGNOSIS — R531 Weakness: Secondary | ICD-10-CM

## 2016-10-08 ENCOUNTER — Ambulatory Visit
Admission: RE | Admit: 2016-10-08 | Discharge: 2016-10-08 | Disposition: A | Discharge status: Home / Self Care | Payer: Medicare Other | Source: Ambulatory Visit | Attending: Orthopedic Surgery | Admitting: Orthopedic Surgery

## 2016-10-08 DIAGNOSIS — M25511 Pain in right shoulder: Secondary | ICD-10-CM | POA: Diagnosis not present

## 2016-10-08 DIAGNOSIS — R52 Pain, unspecified: Secondary | ICD-10-CM

## 2016-10-08 DIAGNOSIS — R531 Weakness: Secondary | ICD-10-CM

## 2016-10-11 DIAGNOSIS — M75121 Complete rotator cuff tear or rupture of right shoulder, not specified as traumatic: Secondary | ICD-10-CM | POA: Diagnosis not present

## 2016-10-21 ENCOUNTER — Other Ambulatory Visit: Payer: Self-pay | Admitting: General Surgery

## 2016-10-21 DIAGNOSIS — K7689 Other specified diseases of liver: Secondary | ICD-10-CM

## 2016-10-26 DIAGNOSIS — M75121 Complete rotator cuff tear or rupture of right shoulder, not specified as traumatic: Secondary | ICD-10-CM | POA: Diagnosis not present

## 2016-10-26 DIAGNOSIS — M24111 Other articular cartilage disorders, right shoulder: Secondary | ICD-10-CM | POA: Diagnosis not present

## 2016-10-26 DIAGNOSIS — M19011 Primary osteoarthritis, right shoulder: Secondary | ICD-10-CM | POA: Diagnosis not present

## 2016-10-26 DIAGNOSIS — M94211 Chondromalacia, right shoulder: Secondary | ICD-10-CM | POA: Diagnosis not present

## 2016-10-26 DIAGNOSIS — G8918 Other acute postprocedural pain: Secondary | ICD-10-CM | POA: Diagnosis not present

## 2016-10-26 DIAGNOSIS — S83421A Sprain of lateral collateral ligament of right knee, initial encounter: Secondary | ICD-10-CM | POA: Diagnosis not present

## 2016-10-26 DIAGNOSIS — S43421A Sprain of right rotator cuff capsule, initial encounter: Secondary | ICD-10-CM | POA: Diagnosis not present

## 2016-11-01 DIAGNOSIS — R29898 Other symptoms and signs involving the musculoskeletal system: Secondary | ICD-10-CM | POA: Diagnosis not present

## 2016-11-01 DIAGNOSIS — M25611 Stiffness of right shoulder, not elsewhere classified: Secondary | ICD-10-CM | POA: Diagnosis not present

## 2016-11-01 DIAGNOSIS — M75101 Unspecified rotator cuff tear or rupture of right shoulder, not specified as traumatic: Secondary | ICD-10-CM | POA: Diagnosis not present

## 2016-11-01 DIAGNOSIS — M25511 Pain in right shoulder: Secondary | ICD-10-CM | POA: Diagnosis not present

## 2016-11-08 DIAGNOSIS — R29898 Other symptoms and signs involving the musculoskeletal system: Secondary | ICD-10-CM | POA: Diagnosis not present

## 2016-11-08 DIAGNOSIS — M75101 Unspecified rotator cuff tear or rupture of right shoulder, not specified as traumatic: Secondary | ICD-10-CM | POA: Diagnosis not present

## 2016-11-08 DIAGNOSIS — M25611 Stiffness of right shoulder, not elsewhere classified: Secondary | ICD-10-CM | POA: Diagnosis not present

## 2016-11-08 DIAGNOSIS — M25511 Pain in right shoulder: Secondary | ICD-10-CM | POA: Diagnosis not present

## 2016-11-14 ENCOUNTER — Ambulatory Visit
Admission: RE | Admit: 2016-11-14 | Discharge: 2016-11-14 | Disposition: A | Discharge status: Home / Self Care | Payer: Medicare Other | Source: Ambulatory Visit | Attending: General Surgery | Admitting: General Surgery

## 2016-11-14 DIAGNOSIS — K5730 Diverticulosis of large intestine without perforation or abscess without bleeding: Secondary | ICD-10-CM | POA: Diagnosis not present

## 2016-11-14 DIAGNOSIS — K7689 Other specified diseases of liver: Secondary | ICD-10-CM

## 2016-11-14 MED ORDER — IOPAMIDOL (ISOVUE-300) INJECTION 61%
125.0000 mL | Freq: Once | INTRAVENOUS | Status: AC | PRN
  Administered 2016-11-14: 125 mL via INTRAVENOUS

## 2016-11-16 DIAGNOSIS — M25511 Pain in right shoulder: Secondary | ICD-10-CM | POA: Diagnosis not present

## 2016-11-16 DIAGNOSIS — M25611 Stiffness of right shoulder, not elsewhere classified: Secondary | ICD-10-CM | POA: Diagnosis not present

## 2016-11-16 DIAGNOSIS — M75101 Unspecified rotator cuff tear or rupture of right shoulder, not specified as traumatic: Secondary | ICD-10-CM | POA: Diagnosis not present

## 2016-11-16 DIAGNOSIS — Z9889 Other specified postprocedural states: Secondary | ICD-10-CM | POA: Diagnosis not present

## 2016-11-16 DIAGNOSIS — R29898 Other symptoms and signs involving the musculoskeletal system: Secondary | ICD-10-CM | POA: Diagnosis not present

## 2016-11-23 DIAGNOSIS — M25611 Stiffness of right shoulder, not elsewhere classified: Secondary | ICD-10-CM | POA: Diagnosis not present

## 2016-11-23 DIAGNOSIS — R29898 Other symptoms and signs involving the musculoskeletal system: Secondary | ICD-10-CM | POA: Diagnosis not present

## 2016-11-23 DIAGNOSIS — M75101 Unspecified rotator cuff tear or rupture of right shoulder, not specified as traumatic: Secondary | ICD-10-CM | POA: Diagnosis not present

## 2016-11-23 DIAGNOSIS — Z9889 Other specified postprocedural states: Secondary | ICD-10-CM | POA: Diagnosis not present

## 2016-11-23 DIAGNOSIS — M25511 Pain in right shoulder: Secondary | ICD-10-CM | POA: Diagnosis not present

## 2016-11-25 DIAGNOSIS — M25611 Stiffness of right shoulder, not elsewhere classified: Secondary | ICD-10-CM | POA: Diagnosis not present

## 2016-11-25 DIAGNOSIS — R29898 Other symptoms and signs involving the musculoskeletal system: Secondary | ICD-10-CM | POA: Diagnosis not present

## 2016-11-25 DIAGNOSIS — M75101 Unspecified rotator cuff tear or rupture of right shoulder, not specified as traumatic: Secondary | ICD-10-CM | POA: Diagnosis not present

## 2016-11-25 DIAGNOSIS — M25511 Pain in right shoulder: Secondary | ICD-10-CM | POA: Diagnosis not present

## 2016-11-25 DIAGNOSIS — Z9889 Other specified postprocedural states: Secondary | ICD-10-CM | POA: Diagnosis not present

## 2016-11-28 DIAGNOSIS — I10 Essential (primary) hypertension: Secondary | ICD-10-CM | POA: Diagnosis not present

## 2016-11-28 DIAGNOSIS — F324 Major depressive disorder, single episode, in partial remission: Secondary | ICD-10-CM | POA: Diagnosis not present

## 2016-11-28 DIAGNOSIS — M5136 Other intervertebral disc degeneration, lumbar region: Secondary | ICD-10-CM | POA: Diagnosis not present

## 2016-11-30 DIAGNOSIS — M25511 Pain in right shoulder: Secondary | ICD-10-CM | POA: Diagnosis not present

## 2016-11-30 DIAGNOSIS — R29898 Other symptoms and signs involving the musculoskeletal system: Secondary | ICD-10-CM | POA: Diagnosis not present

## 2016-11-30 DIAGNOSIS — Z9889 Other specified postprocedural states: Secondary | ICD-10-CM | POA: Diagnosis not present

## 2016-11-30 DIAGNOSIS — M25611 Stiffness of right shoulder, not elsewhere classified: Secondary | ICD-10-CM | POA: Diagnosis not present

## 2016-11-30 DIAGNOSIS — M75101 Unspecified rotator cuff tear or rupture of right shoulder, not specified as traumatic: Secondary | ICD-10-CM | POA: Diagnosis not present

## 2016-12-08 DIAGNOSIS — Z9889 Other specified postprocedural states: Secondary | ICD-10-CM | POA: Diagnosis not present

## 2016-12-08 DIAGNOSIS — R29898 Other symptoms and signs involving the musculoskeletal system: Secondary | ICD-10-CM | POA: Diagnosis not present

## 2016-12-08 DIAGNOSIS — M25511 Pain in right shoulder: Secondary | ICD-10-CM | POA: Diagnosis not present

## 2016-12-08 DIAGNOSIS — M75101 Unspecified rotator cuff tear or rupture of right shoulder, not specified as traumatic: Secondary | ICD-10-CM | POA: Diagnosis not present

## 2016-12-08 DIAGNOSIS — M25611 Stiffness of right shoulder, not elsewhere classified: Secondary | ICD-10-CM | POA: Diagnosis not present

## 2016-12-13 DIAGNOSIS — M25511 Pain in right shoulder: Secondary | ICD-10-CM | POA: Diagnosis not present

## 2016-12-13 DIAGNOSIS — R29898 Other symptoms and signs involving the musculoskeletal system: Secondary | ICD-10-CM | POA: Diagnosis not present

## 2016-12-13 DIAGNOSIS — Z9889 Other specified postprocedural states: Secondary | ICD-10-CM | POA: Diagnosis not present

## 2016-12-13 DIAGNOSIS — M75101 Unspecified rotator cuff tear or rupture of right shoulder, not specified as traumatic: Secondary | ICD-10-CM | POA: Diagnosis not present

## 2016-12-13 DIAGNOSIS — M25611 Stiffness of right shoulder, not elsewhere classified: Secondary | ICD-10-CM | POA: Diagnosis not present

## 2016-12-19 DIAGNOSIS — M75101 Unspecified rotator cuff tear or rupture of right shoulder, not specified as traumatic: Secondary | ICD-10-CM | POA: Diagnosis not present

## 2016-12-19 DIAGNOSIS — Z9889 Other specified postprocedural states: Secondary | ICD-10-CM | POA: Diagnosis not present

## 2016-12-19 DIAGNOSIS — R29898 Other symptoms and signs involving the musculoskeletal system: Secondary | ICD-10-CM | POA: Diagnosis not present

## 2016-12-19 DIAGNOSIS — M25511 Pain in right shoulder: Secondary | ICD-10-CM | POA: Diagnosis not present

## 2016-12-19 DIAGNOSIS — M25611 Stiffness of right shoulder, not elsewhere classified: Secondary | ICD-10-CM | POA: Diagnosis not present

## 2017-01-05 DIAGNOSIS — Z9889 Other specified postprocedural states: Secondary | ICD-10-CM | POA: Diagnosis not present

## 2017-01-05 DIAGNOSIS — M25511 Pain in right shoulder: Secondary | ICD-10-CM | POA: Diagnosis not present

## 2017-01-05 DIAGNOSIS — M25611 Stiffness of right shoulder, not elsewhere classified: Secondary | ICD-10-CM | POA: Diagnosis not present

## 2017-01-05 DIAGNOSIS — M75101 Unspecified rotator cuff tear or rupture of right shoulder, not specified as traumatic: Secondary | ICD-10-CM | POA: Diagnosis not present

## 2017-01-05 DIAGNOSIS — R29898 Other symptoms and signs involving the musculoskeletal system: Secondary | ICD-10-CM | POA: Diagnosis not present

## 2017-02-03 DIAGNOSIS — Z9889 Other specified postprocedural states: Secondary | ICD-10-CM | POA: Diagnosis not present

## 2017-02-03 DIAGNOSIS — R29898 Other symptoms and signs involving the musculoskeletal system: Secondary | ICD-10-CM | POA: Diagnosis not present

## 2017-02-03 DIAGNOSIS — M25611 Stiffness of right shoulder, not elsewhere classified: Secondary | ICD-10-CM | POA: Diagnosis not present

## 2017-02-03 DIAGNOSIS — M75101 Unspecified rotator cuff tear or rupture of right shoulder, not specified as traumatic: Secondary | ICD-10-CM | POA: Diagnosis not present

## 2017-02-03 DIAGNOSIS — M25511 Pain in right shoulder: Secondary | ICD-10-CM | POA: Diagnosis not present

## 2017-02-16 DIAGNOSIS — N390 Urinary tract infection, site not specified: Secondary | ICD-10-CM | POA: Diagnosis not present

## 2017-02-16 DIAGNOSIS — R110 Nausea: Secondary | ICD-10-CM | POA: Diagnosis not present

## 2017-02-16 DIAGNOSIS — M545 Low back pain: Secondary | ICD-10-CM | POA: Diagnosis not present

## 2017-03-02 ENCOUNTER — Other Ambulatory Visit: Payer: Self-pay | Admitting: Family Medicine

## 2017-03-02 DIAGNOSIS — M5412 Radiculopathy, cervical region: Secondary | ICD-10-CM

## 2017-03-02 DIAGNOSIS — R079 Chest pain, unspecified: Secondary | ICD-10-CM | POA: Diagnosis not present

## 2017-03-02 DIAGNOSIS — R110 Nausea: Secondary | ICD-10-CM | POA: Diagnosis not present

## 2017-03-05 ENCOUNTER — Encounter (HOSPITAL_COMMUNITY): Payer: Self-pay | Admitting: Emergency Medicine

## 2017-03-05 ENCOUNTER — Emergency Department (HOSPITAL_COMMUNITY): Payer: Medicare Other

## 2017-03-05 ENCOUNTER — Emergency Department (HOSPITAL_COMMUNITY)
Admission: EM | Admit: 2017-03-05 | Discharge: 2017-03-05 | Disposition: A | Discharge status: Home / Self Care | Payer: Medicare Other | Attending: Emergency Medicine | Admitting: Emergency Medicine

## 2017-03-05 DIAGNOSIS — Z7982 Long term (current) use of aspirin: Secondary | ICD-10-CM | POA: Insufficient documentation

## 2017-03-05 DIAGNOSIS — E119 Type 2 diabetes mellitus without complications: Secondary | ICD-10-CM | POA: Insufficient documentation

## 2017-03-05 DIAGNOSIS — M542 Cervicalgia: Secondary | ICD-10-CM | POA: Diagnosis not present

## 2017-03-05 DIAGNOSIS — M5481 Occipital neuralgia: Secondary | ICD-10-CM

## 2017-03-05 DIAGNOSIS — I10 Essential (primary) hypertension: Secondary | ICD-10-CM | POA: Insufficient documentation

## 2017-03-05 DIAGNOSIS — E039 Hypothyroidism, unspecified: Secondary | ICD-10-CM | POA: Insufficient documentation

## 2017-03-05 DIAGNOSIS — M50322 Other cervical disc degeneration at C5-C6 level: Secondary | ICD-10-CM | POA: Diagnosis not present

## 2017-03-05 DIAGNOSIS — Z794 Long term (current) use of insulin: Secondary | ICD-10-CM | POA: Diagnosis not present

## 2017-03-05 DIAGNOSIS — R200 Anesthesia of skin: Secondary | ICD-10-CM | POA: Insufficient documentation

## 2017-03-05 DIAGNOSIS — M50323 Other cervical disc degeneration at C6-C7 level: Secondary | ICD-10-CM | POA: Diagnosis not present

## 2017-03-05 DIAGNOSIS — M25512 Pain in left shoulder: Secondary | ICD-10-CM | POA: Diagnosis not present

## 2017-03-05 LAB — CBC WITH DIFFERENTIAL/PLATELET
Basophils Absolute: 0.1 K/uL (ref 0.0–0.1)
Basophils Relative: 1 %
Eosinophils Absolute: 0.3 K/uL (ref 0.0–0.7)
Eosinophils Relative: 5 %
HCT: 31.8 % — ABNORMAL LOW (ref 36.0–46.0)
Hemoglobin: 9.2 g/dL — ABNORMAL LOW (ref 12.0–15.0)
Lymphocytes Relative: 24 %
Lymphs Abs: 1.3 K/uL (ref 0.7–4.0)
MCH: 18.7 pg — ABNORMAL LOW (ref 26.0–34.0)
MCHC: 28.9 g/dL — ABNORMAL LOW (ref 30.0–36.0)
MCV: 64.5 fL — ABNORMAL LOW (ref 78.0–100.0)
Monocytes Absolute: 0.4 K/uL (ref 0.1–1.0)
Monocytes Relative: 7 %
Neutro Abs: 3.2 K/uL (ref 1.7–7.7)
Neutrophils Relative %: 63 %
Platelets: 311 K/uL (ref 150–400)
RBC: 4.93 MIL/uL (ref 3.87–5.11)
RDW: 16.9 % — ABNORMAL HIGH (ref 11.5–15.5)
WBC: 5.3 K/uL (ref 4.0–10.5)

## 2017-03-05 LAB — BASIC METABOLIC PANEL
Anion gap: 8 (ref 5–15)
BUN: 6 mg/dL (ref 6–20)
CALCIUM: 9.1 mg/dL (ref 8.9–10.3)
CHLORIDE: 98 mmol/L — AB (ref 101–111)
CO2: 28 mmol/L (ref 22–32)
CREATININE: 1.01 mg/dL — AB (ref 0.44–1.00)
GFR calc non Af Amer: 57 mL/min — ABNORMAL LOW (ref >60)
GLUCOSE: 176 mg/dL — AB (ref 65–99)
Potassium: 3.8 mmol/L (ref 3.5–5.1)
Sodium: 134 mmol/L — ABNORMAL LOW (ref 135–145)

## 2017-03-05 LAB — I-STAT TROPONIN, ED: TROPONIN I, POC: 0 ng/mL (ref 0.00–0.08)

## 2017-03-05 LAB — CBG MONITORING, ED: GLUCOSE-CAPILLARY: 143 mg/dL — AB (ref 65–99)

## 2017-03-05 MED ORDER — METOCLOPRAMIDE HCL 5 MG/ML IJ SOLN
10.0000 mg | Freq: Once | INTRAMUSCULAR | Status: AC
  Administered 2017-03-05: 10 mg via INTRAVENOUS
  Filled 2017-03-05: qty 2

## 2017-03-05 MED ORDER — ONDANSETRON HCL 4 MG/2ML IJ SOLN
4.0000 mg | Freq: Once | INTRAMUSCULAR | Status: AC
  Administered 2017-03-05: 4 mg via INTRAVENOUS
  Filled 2017-03-05: qty 2

## 2017-03-05 MED ORDER — MORPHINE SULFATE (PF) 4 MG/ML IV SOLN
4.0000 mg | Freq: Once | INTRAVENOUS | Status: AC
  Administered 2017-03-05: 4 mg via INTRAVENOUS
  Filled 2017-03-05: qty 1

## 2017-03-05 MED ORDER — OXYCODONE HCL 5 MG PO TABS: 5.0000 mg | ORAL_TABLET | ORAL | 0 refills | Status: DC | PRN

## 2017-03-05 MED ORDER — DIPHENHYDRAMINE HCL 50 MG/ML IJ SOLN
25.0000 mg | Freq: Once | INTRAMUSCULAR | Status: AC
  Administered 2017-03-05: 25 mg via INTRAVENOUS
  Filled 2017-03-05: qty 1

## 2017-03-05 MED ORDER — BACLOFEN 10 MG PO TABS: 10.0000 mg | ORAL_TABLET | Freq: Three times a day (TID) | ORAL | 0 refills | Status: DC

## 2017-03-05 NOTE — ED Provider Notes (Signed)
[facility] DEPT Provider Note   CSN: [id number] Arrival date & time: 03/05/17  1309     History   Chief Complaint Chief Complaint  Patient presents with  . Shoulder Pain    left  . Arm Pain    left  . Neck Pain    left    HPI [name] is a 66 y.o. female.He presents emergency Department with chief complaint of "pinched nerve." Patient states he has had progressively worsening pain on the left side of her neck radiating up around her scalp. She complains of numbness on the left side of her face. She also complains of tightness and pain behind the left shoulder blade and numbness across the top of her shoulder. She says occasionally her whole left arm goes numb. She denies weakness. She states that the pain is sharp, constant and 8 out of 10. She states at times it takes her breath away. She is unable to control with 10 mg hydrocodone. She denies chest pain, shortness of breath. She states that she has had associated nausea with her pain and retching. He is diabetic with hypertension. She denies a history of heart disease.  HPI  Past Medical History:  Diagnosis Date  . Anemia    iron deficiency  . Arthritis   . Blood transfusion   . Depression   . Diabetes mellitus   . Diastolic dysfunction   . Diverticulitis   . DJD (degenerative joint disease)   . DVT (deep venous thrombosis) (HCC)    times 2 lower leg  . Endometriosis   . Family history of adverse reaction to anesthesia    mom and dad PONV  . Fibromyalgia   . GERD (gastroesophageal reflux disease)    occ  . Hiatal hernia   . Hyperlipidemia   . Hypertension   . Hypothyroidism   . Insomnia   . Kidney stones   . Mild aortic insufficiency    by echo 04/2013  . Osteoarthritis    back and knee  . Ovarian cyst   . PONV (postoperative nausea and vomiting)   . RLS (restless legs syndrome)   . Thoracic compression fracture ([place])   . Uterine fibroid   . UTI (lower urinary tract infection)   . Villous adenoma  of right colon 04/08/2016  . Vitamin D deficiency disease     Patient Active Problem List   Diagnosis Date Noted  . Hypoxia   . Villous adenoma of right colon 04/08/2016  . Leukocytosis 04/08/2016  . Anemia   . Diastolic dysfunction   . Mild aortic insufficiency   . Other nonspecific abnormal cardiovascular system function study 05/03/2013  . SOB (shortness of breath) 05/01/2013  . Atypical chest pain 02/28/2013  . Dysphagia 02/28/2013  . UTI (lower urinary tract infection) 07/06/2012  . Intractable nausea and vomiting 07/06/2012  . Blood loss anemia 07/06/2012  . Acute kidney injury ([place]) 07/06/2012  . Epigastric abdominal pain 06/27/2012  . Type II diabetes mellitus ([place]) 07/28/2011  . Hypothyroidism 07/28/2011  . Hypertension 07/28/2011  . Hepatic steatosis 07/28/2011    Past Surgical History:  Procedure Laterality Date  . APPENDECTOMY    . BACK SURGERY  ,2005   April  2013 - [facility] fusion[facility]  . BREAST SURGERY     breast reduction  . CARDIAC CATHETERIZATION  04/2013   normal coronary arteries and normal LVF  . CARPAL TUNNEL RELEASE  06/05/2012   Procedure: CARPAL TUNNEL RELEASE;  Surgeon: Cammie Sickle.,  MD;  Location: [facility];  Service: Orthopedics;  Laterality: Left;  . CESAREAN SECTION     x 2  . CHOLECYSTECTOMY    . COLECTOMY  04/08/2016  . DORSAL COMPARTMENT RELEASE  06/05/2012   Procedure: RELEASE DORSAL COMPARTMENT (DEQUERVAIN);  Surgeon: Cammie Sickle., MD;  Location: [facility];  Service: Orthopedics;  Laterality: Left;  Excision of mixoid cyst also  . EYE SURGERY     cataracts bilateral  . KNEE ARTHROPLASTY  09   lft partial  . KNEE ARTHROPLASTY     rt  . LAPAROSCOPIC PARTIAL COLECTOMY N/A 04/08/2016   Procedure: LAPAROSCOPIC ASSISTED ASCENDING COLECTOMY POSSIBLE OPEN COLECTOMY;  Surgeon: Fanny Skates, MD;  Location: [facility];  Service: General;  Laterality: N/A;  . orthopedic surgeries     multiple  . SHOULDER  OPEN ROTATOR CUFF REPAIR     rt and lft  . TUBAL LIGATION     btsp    OB History    Gravida Para Term Preterm AB Living   2 2 2     2    SAB TAB Ectopic Multiple Live Births                   Home Medications    Prior to Admission medications   Medication Sig Start Date End Date Taking? Authorizing Provider  albuterol (PROVENTIL HFA;VENTOLIN HFA) 108 (90 Base) MCG/ACT inhaler Inhale 2 puffs into the lungs every 4 (four) hours as needed for wheezing or shortness of breath. 08/08/16   Joy, Shawn C, PA-C  amLODipine (NORVASC) 5 MG tablet Take 5 mg by mouth daily.    [provider]  aspirin 81 MG chewable tablet Chew 81 mg by mouth daily.    [provider]  azithromycin (ZITHROMAX) 250 MG tablet Take 250-500 mg by mouth See admin instructions. Started 12/29/17Take 2 tablets (500 mg) by mouth 1st day, then take 1 tablet (250 mg) on days 2-5 08/05/16   [provider]  benzonatate (TESSALON) 100 MG capsule Take 1 capsule (100 mg total) by mouth every 8 (eight) hours. 08/08/16   Joy, Shawn C, PA-C  clindamycin (CLEOCIN T) 1 % lotion Apply 1 application topically 2 (two) times daily as needed (rash/ itching).  07/13/16   [provider]  clobetasol (TEMOVATE) 0.05 % external solution Apply 1 application topically See admin instructions. Apply twice daily to scalp and ear as needed for itching/rash (do not apply to face/groin/underarms) 07/13/16   [provider]  Cyanocobalamin (VITAMIN B-12 PO) Take 1 tablet by mouth daily.    [provider]  diazepam (VALIUM) 2 MG tablet Take 0.5 tablets (1 mg total) by mouth every 12 (twelve) hours as needed for anxiety or muscle spasms. Patient taking differently: Take 1 mg by mouth every 12 (twelve) hours as needed for anxiety.  04/12/16   Reyne Dumas, MD  doxycycline (VIBRA-TABS) 100 MG tablet Take 100 mg by mouth See admin instructions. #60 filled 07/13/16 - Take 1 tablet (100 mg) by mouth twice daily with  food and water until rash is cleared, resume if rash returns 07/13/16   [provider]  DULoxetine (CYMBALTA) 60 MG capsule Take 60 mg by mouth daily.    [provider]  fluticasone (FLONASE) 50 MCG/ACT nasal spray Place 2 sprays into both nostrils daily as needed for allergies or rhinitis.    [provider]  furosemide (LASIX) 20 MG tablet Take 20-40 mg by mouth daily  as needed for fluid or edema.     [provider]  HYDROcodone-acetaminophen (NORCO/VICODIN) 5-325 MG per tablet Take 1 tablet by mouth every 4 (four) hours as needed for moderate pain or severe pain. 10/29/14   Antonietta Breach, PA-C  ibuprofen (ADVIL,MOTRIN) 200 MG tablet Take 400 mg by mouth every 6 (six) hours as needed for headache (pain).    [provider]  insulin aspart protamine- aspart (NOVOLOG MIX 70/30) (70-30) 100 UNIT/ML injection Inject 0.3 mLs (30 Units total) into the skin 2 (two) times daily with a meal. Patient not taking: Reported on 08/08/2016 04/12/16   Reyne Dumas, MD  insulin NPH-regular Human (NOVOLIN 70/30) (70-30) 100 UNIT/ML injection Inject 50 Units into the skin 2 (two) times daily before a meal.    [provider]  levothyroxine (SYNTHROID, LEVOTHROID) 137 MCG tablet Take 137 mcg by mouth daily before breakfast.    [provider]  metoprolol tartrate (LOPRESSOR) 25 MG tablet Take 0.5 tablets (12.5 mg total) by mouth daily. Patient not taking: Reported on 08/08/2016 04/12/16   Reyne Dumas, MD  pantoprazole (PROTONIX) 40 MG tablet Take 40 mg by mouth 2 (two) times daily.    [provider]  temazepam (RESTORIL) 30 MG capsule Take 30 mg by mouth at bedtime.     [provider]  traMADol (ULTRAM) 50 MG tablet Take 50-100 mg by mouth every 6 (six) hours as needed (pain).  08/05/16   [provider]  valsartan (DIOVAN) 320 MG tablet Take 320 mg by mouth daily.    [provider]  Vitamin D, Ergocalciferol, (DRISDOL)  50000 UNITS CAPS capsule TAKE ONE CAPSULE BY MOUTH EVERY OTHER WEEK Patient not taking: Reported on 08/08/2016 04/22/13   Regina Eck, CNM    Family History Family History  Problem Relation Age of Onset  . Heart attack Father        33s  . Hypothyroidism Father   . Diabetes type II Father   . Diabetes type II Mother   . Hypertension Mother   . Breast cancer Mother 50  . Hypothyroidism Brother     Social History Social History  Substance Use Topics  . Smoking status: Never Smoker  . Smokeless tobacco: Never Used  . Alcohol use No     Allergies   Codeine; Ambien [zolpidem tartrate]; Crestor [rosuvastatin]; Lipitor [atorvastatin]; Morphine and related; Penicillins; Statins; Dilaudid [hydromorphone hcl]; Amitriptyline; Ceftin [cefuroxime axetil]; Lovaza [omega-3-acid ethyl esters]; Lunesta [eszopiclone]; Lyrica [pregabalin]; and Metformin and related   Review of Systems Review of Systems  Ten systems reviewed and are negative for acute change, except as noted in the HPI.    Physical Exam Updated Vital Signs BP (!) 172/98 (BP Location: Left Arm)   Pulse 99   Temp 98.1 F (36.7 C) (Oral)   Resp 17   Ht 5\' 4"  (1.626 m)   Wt 95.3 kg (210 lb)   SpO2 97%   BMI 36.05 kg/m   Physical Exam  Constitutional: She is oriented to person, place, and time. She appears well-developed and well-nourished. No distress.  HENT:  Head: Normocephalic and atraumatic.  Eyes: Conjunctivae are normal. No scleral icterus.  Neck: Normal range of motion.  Cardiovascular: Normal rate, regular rhythm and normal heart sounds.  Exam reveals no gallop and no friction rub.   No murmur heard. Pulmonary/Chest: Effort normal and breath sounds normal. No respiratory distress.  Abdominal: Soft. Bowel sounds are normal. She exhibits no distension and no mass. There is  no tenderness. There is no guarding.  Neurological: She is alert and oriented to person, place, and time.  Subjective facial numbness  on the left without other neurologic abnormalities on exam. No upper extremity weakness. Scalp pain is reproducible with palpation of the greater occipital nerve.  No reproducible pain in the shoulder blade or arm with palpation of the tissue of the shoulder girdle.  Skin: Skin is warm and dry. She is not diaphoretic.  Psychiatric: Her behavior is normal.  Nursing note and vitals reviewed.    ED Treatments / Results  Labs (all labs ordered are listed, but only abnormal results are displayed) Labs Reviewed  CBC WITH DIFFERENTIAL/PLATELET - Abnormal; Notable for the following:       Result Value   Hemoglobin 9.2 (*)    HCT 31.8 (*)    MCV 64.5 (*)    MCH 18.7 (*)    MCHC 28.9 (*)    RDW 16.9 (*)    All other components within normal limits  BASIC METABOLIC PANEL - Abnormal; Notable for the following:    Sodium 134 (*)    Chloride 98 (*)    Glucose, Bld 176 (*)    Creatinine, Ser 1.01 (*)    GFR calc non Af Amer 57 (*)    All other components within normal limits  I-STAT TROPONIN, ED  CBG MONITORING, ED    EKG  EKG Interpretation None       Radiology No results found.  Procedures Procedures (including critical care time)  Medications Ordered in ED Medications  morphine 4 MG/ML injection 4 mg (not administered)  diphenhydrAMINE (BENADRYL) injection 25 mg (not administered)  ondansetron (ZOFRAN) injection 4 mg (not administered)     Initial Impression / Assessment and Plan / ED Course  I have reviewed the triage vital signs and the nursing notes.  Pertinent labs & imaging results that were available during my care of the patient were reviewed by me and considered in my medical decision making (see chart for details).      Patient was subjective facial numbness, no neurologic abnormalities on examination. She is having severe pain. Patient cannot be given Dilaudid as she becomes psychotic. I have offered morphine with Benadryl as this causes itching. Patient will  undergo MRI brain and MRI C-spine. Patient seen in shared visit with Dr. Tyrone Nine, who agrees with the workup and plan of care.   I have given sign out to [facility].  Final Clinical Impressions(s) / ED Diagnoses   Final diagnoses:  None    New Prescriptions New Prescriptions   No medications on file     Margarita Mail, PA-C 03/05/17 [place], [place], DO 03/05/17 1601

## 2017-03-05 NOTE — ED Provider Notes (Signed)
  Physical Exam  BP (!) 172/98 (BP Location: Left Arm)   Pulse 99   Temp 98.1 F (36.7 C) (Oral)   Resp 17   Ht 5\' 4"  (1.626 m)   Wt 95.3 kg (210 lb)   SpO2 92% Comment: On room air  BMI 36.05 kg/m   Physical Exam  ED Course  Procedures  3:09 PM- Sign out from Margarita Mail, PA-C  Per previous provider MDM: Patient was subjective facial numbness, no neurologic abnormalities on examination. She is having severe pain. Patient cannot be given Dilaudid as she becomes psychotic. I have offered morphine with Benadryl as this causes itching. Patient will undergo MRI brain and MRI C-spine. Patient seen in shared visit with Dr. Tyrone Nine, who agrees with the workup and plan of care.  Pending MRA Head and C Spine  6:01 PM- MRA head/ C Spine  IMPRESSION:  50% stenosis RIGHT supraclinoid ICA, otherwise unremarkable MR  angiography of the intracranial circulation.   IMPRESSION:  Motion degraded exam of relatively poor diagnostic quality.    Multilevel stenosis C4-C7 with cord flattening but no definite  abnormal cord signal.    Symptomatic LEFT-sided neural impingement could be present at C3-4,  C4-5, C5-6, and/or C6-7, due to a combination of disc space  narrowing, bony overgrowth, and disc material; see discussion above.    If further investigation desired, cervical myelogram with  postmyelogram CT, on elective basis, could be helpful in further  evaluation.    Discussed result with patient. At time of discharge, Patient is in no acute distress. Vital Signs are stable. Patient is able to ambulate. Patient able to tolerate PO.        Shary Decamp, PA-C 03/05/17 [place], [place], DO 03/06/17 1208

## 2017-03-05 NOTE — ED Triage Notes (Signed)
Pt. Stated, I saw Dr. Luiz Blare at [facility] on Thursday and he ordered a MRI on my neck , everything else was ok he said.  Im having LEFT shoulder, neck arm pain for a week. I can't eat or anything else because of the pain.

## 2017-03-05 NOTE — ED Notes (Signed)
Patient transported to MRI 

## 2017-03-05 NOTE — ED Notes (Signed)
ED Provider at bedside. 

## 2017-03-05 NOTE — ED Notes (Signed)
PA Devona Konig made aware of patients c/o of nausea and returning pain.

## 2017-03-05 NOTE — ED Notes (Signed)
Patient placed on 2L O2 via nasal cannula. 

## 2017-03-05 NOTE — ED Notes (Signed)
Please call son for any changes in condition.

## 2017-03-05 NOTE — ED Notes (Signed)
Patient roomed and provider at bedside.

## 2017-03-05 NOTE — Discharge Instructions (Signed)
Contact a health care provider if: °Your condition does not improve with treatment. °Get help right away if: °Your pain gets much worse and cannot be controlled with medicines. °You have weakness or numbness in your hand, arm, face, or leg. °You have a high fever. °You have a stiff, rigid neck. °You lose control of your bowels or your bladder (have incontinence). °You have trouble with walking, balance, or speaking. °

## 2017-03-08 DIAGNOSIS — I10 Essential (primary) hypertension: Secondary | ICD-10-CM | POA: Diagnosis not present

## 2017-03-08 DIAGNOSIS — Z6837 Body mass index (BMI) 37.0-37.9, adult: Secondary | ICD-10-CM | POA: Diagnosis not present

## 2017-03-08 DIAGNOSIS — M4722 Other spondylosis with radiculopathy, cervical region: Secondary | ICD-10-CM | POA: Diagnosis not present

## 2017-03-08 DIAGNOSIS — M5020 Other cervical disc displacement, unspecified cervical region: Secondary | ICD-10-CM | POA: Diagnosis not present

## 2017-03-08 DIAGNOSIS — M4712 Other spondylosis with myelopathy, cervical region: Secondary | ICD-10-CM | POA: Diagnosis not present

## 2017-03-10 ENCOUNTER — Other Ambulatory Visit: Payer: Self-pay | Admitting: Neurosurgery

## 2017-03-15 ENCOUNTER — Other Ambulatory Visit: Payer: Medicare Other

## 2017-03-20 NOTE — Pre-Procedure Instructions (Signed)
[name]  03/20/2017      [id number] - [place], [place] - [address] [place] [address] Phone: [phone] Fax: [phone]    Your procedure is scheduled on Thursday, March 23, 2017   Report to [facility] Admitting Entrance "A" at 5:30 A.M.  Call this number if you have problems the morning of surgery:  [phone]   Remember:  Do not eat food or drink liquids after midnight on March 22, 2017  Take these medicines the morning of surgery with A SIP OF WATER:  AmLODipine (NORVASC), DULoxetine (CYMBALTA), Levothyroxine (SYNTHROID, LEVOTHROID), and Pantoprazole (PROTONIX). If needed Promethazine (PHENERGAN), HYDROcodone-acetaminophen (NORCO/VICODIN), Fluticasone (FLONASE),  Cyclobenzaprine (FLEXERIL), and Albuterol inhaler (bring with you the day of surgery).  As of today stop taking all Aspirins, Vitamins, Fish oils, and Herbal medications. Also stop all NSAIDS i.e. Advil, Motrin, Aleve, Anaprox, Naproxen, BC and Goody Powders.  How to Manage Your Diabetes Before and After Surgery  Why is it important to control my blood sugar before and after surgery? . Improving blood sugar levels before and after surgery helps healing and can limit problems. . A way of improving blood sugar control is eating a healthy diet by: o  Eating less sugar and carbohydrates o  Increasing activity/exercise o  Talking with your doctor about reaching your blood sugar goals . High blood sugars (greater than 180 mg/dL) can raise your risk of infections and slow your recovery, so you will need to focus on controlling your diabetes during the weeks before surgery. . Make sure that the doctor who takes care of your diabetes knows about your planned surgery including the date and location.  How do I manage my blood sugar before surgery? . Check your blood sugar at least 4 times a day, starting 2 days before surgery, to  make sure that the level is not too high or low. o Check your blood sugar the morning of your surgery when you wake up and every 2 hours until you get to the Short Stay unit. . If your blood sugar is less than 70 mg/dL, you will need to treat for low blood sugar: o Do not take insulin. o Treat a low blood sugar (less than 70 mg/dL) with  cup of clear juice (cranberry or apple), 4 glucose tablets, OR glucose gel. o Recheck blood sugar in 15 minutes after treatment (to make sure it is greater than 70 mg/dL). If your blood sugar is not greater than 70 mg/dL on recheck, call [phone] for further instructions. . Report your blood sugar to the short stay nurse when you get to Short Stay.  . If you are admitted to the hospital after surgery: o Your blood sugar will be checked by the staff and you will probably be given insulin after surgery (instead of oral diabetes medicines) to make sure you have good blood sugar levels. o The goal for blood sugar control after surgery is 80-180 mg/dL.  WHAT DO I DO ABOUT MY DIABETES MEDICATION?   Marland Kitchen Do not take oral diabetes medicines (pills) the morning of surgery.  . THE MORNING/NIGHT BEFORE SURGERY, take ______35_____ units of ___Novolog 70/30 Mix________insulin.       . THE MORNING OF SURGERY, take _________0____ units of ____Novolog 70/30 Mix______insulin.  . The day of surgery, do not take other diabetes injectables, including Byetta (exenatide), Bydureon (exenatide ER), Victoza (liraglutide), or Trulicity (dulaglutide).  Do not wear jewelry, make-up or nail polish.  Do not wear lotions, powders, or perfumes, or deoderant.  Do not shave 48 hours prior to surgery.   Do not bring valuables to the hospital.  [facility] is not responsible for any belongings or valuables.  Contacts, dentures or bridgework may not be worn into surgery.  Leave your suitcase in the car.  After surgery it may be brought to your room.  For patients admitted to the  hospital, discharge time will be determined by your treatment team.  Patients discharged the day of surgery will not be allowed to drive home.   Special instructions:   [facility]- Preparing For Surgery  Before surgery, you can play an important role. Because skin is not sterile, your skin needs to be as free of germs as possible. You can reduce the number of germs on your skin by washing with CHG (chlorahexidine gluconate) Soap before surgery.  CHG is an antiseptic cleaner which kills germs and bonds with the skin to continue killing germs even after washing.  Please do not use if you have an allergy to CHG or antibacterial soaps. If your skin becomes reddened/irritated stop using the CHG.  Do not shave (including legs and underarms) for at least 48 hours prior to first CHG shower. It is OK to shave your face.  Please follow these instructions carefully.   1. Shower the NIGHT BEFORE SURGERY and the MORNING OF SURGERY with CHG.   2. If you chose to wash your hair, wash your hair first as usual with your normal shampoo.  3. After you shampoo, rinse your hair and body thoroughly to remove the shampoo.  4. Use CHG as you would any other liquid soap. You can apply CHG directly to the skin and wash gently with a scrungie or a clean washcloth.   5. Apply the CHG Soap to your body ONLY FROM THE NECK DOWN.  Do not use on open wounds or open sores. Avoid contact with your eyes, ears, mouth and genitals (private parts). Wash genitals (private parts) with your normal soap.  6. Wash thoroughly, paying special attention to the area where your surgery will be performed.  7. Thoroughly rinse your body with warm water from the neck down.  8. DO NOT shower/wash with your normal soap after using and rinsing off the CHG Soap.  9. Pat yourself dry with a CLEAN TOWEL.   10. Wear CLEAN PAJAMAS   11. Place CLEAN SHEETS on your bed the night of your first shower and DO NOT SLEEP WITH PETS.  Day of  Surgery: Do not apply any deodorants/lotions. Please wear clean clothes to the hospital/surgery center.    Please read over the following fact sheets that you were given. Pain Booklet, Coughing and Deep Breathing, Blood Transfusion Information, MRSA Information and Surgical Site Infection Prevention

## 2017-03-21 ENCOUNTER — Encounter (HOSPITAL_COMMUNITY)
Admission: RE | Admit: 2017-03-21 | Discharge: 2017-03-21 | Disposition: A | Discharge status: Home / Self Care | Payer: Medicare Other | Source: Ambulatory Visit | Attending: Neurosurgery | Admitting: Neurosurgery

## 2017-03-21 ENCOUNTER — Encounter (HOSPITAL_COMMUNITY): Payer: Self-pay

## 2017-03-21 DIAGNOSIS — D509 Iron deficiency anemia, unspecified: Secondary | ICD-10-CM | POA: Insufficient documentation

## 2017-03-21 DIAGNOSIS — K219 Gastro-esophageal reflux disease without esophagitis: Secondary | ICD-10-CM | POA: Insufficient documentation

## 2017-03-21 DIAGNOSIS — Z01812 Encounter for preprocedural laboratory examination: Secondary | ICD-10-CM

## 2017-03-21 DIAGNOSIS — E039 Hypothyroidism, unspecified: Secondary | ICD-10-CM | POA: Insufficient documentation

## 2017-03-21 DIAGNOSIS — Z9049 Acquired absence of other specified parts of digestive tract: Secondary | ICD-10-CM

## 2017-03-21 DIAGNOSIS — E119 Type 2 diabetes mellitus without complications: Secondary | ICD-10-CM

## 2017-03-21 DIAGNOSIS — I10 Essential (primary) hypertension: Secondary | ICD-10-CM | POA: Insufficient documentation

## 2017-03-21 DIAGNOSIS — K449 Diaphragmatic hernia without obstruction or gangrene: Secondary | ICD-10-CM

## 2017-03-21 HISTORY — DX: Other spondylosis with radiculopathy, cervical region: M47.22

## 2017-03-21 HISTORY — DX: Personal history of urinary calculi: Z87.442

## 2017-03-21 HISTORY — DX: Other spondylosis with myelopathy, cervical region: M47.12

## 2017-03-21 LAB — CBC
HCT: 32.3 % — ABNORMAL LOW (ref 36.0–46.0)
HEMOGLOBIN: 9.7 g/dL — AB (ref 12.0–15.0)
MCH: 19.3 pg — AB (ref 26.0–34.0)
MCHC: 30 g/dL (ref 30.0–36.0)
MCV: 64.2 fL — AB (ref 78.0–100.0)
Platelets: 283 10*3/uL (ref 150–400)
RBC: 5.03 MIL/uL (ref 3.87–5.11)
RDW: 17.2 % — ABNORMAL HIGH (ref 11.5–15.5)
WBC: 7.9 10*3/uL (ref 4.0–10.5)

## 2017-03-21 LAB — BASIC METABOLIC PANEL
ANION GAP: 9 (ref 5–15)
BUN: 7 mg/dL (ref 6–20)
CALCIUM: 8.8 mg/dL — AB (ref 8.9–10.3)
CHLORIDE: 100 mmol/L — AB (ref 101–111)
CO2: 26 mmol/L (ref 22–32)
CREATININE: 0.99 mg/dL (ref 0.44–1.00)
GFR calc Af Amer: >60 mL/min (ref >60)
GFR calc non Af Amer: 58 mL/min — ABNORMAL LOW (ref >60)
GLUCOSE: 368 mg/dL — AB (ref 65–99)
Potassium: 4 mmol/L (ref 3.5–5.1)
Sodium: 135 mmol/L (ref 135–145)

## 2017-03-21 LAB — GLUCOSE, CAPILLARY: Glucose-Capillary: 365 mg/dL — ABNORMAL HIGH (ref 65–99)

## 2017-03-21 LAB — SURGICAL PCR SCREEN
MRSA, PCR: NEGATIVE
Staphylococcus aureus: NEGATIVE

## 2017-03-21 NOTE — Progress Notes (Signed)
Anesthesia Chart Review: Patient is a 66 year old female scheduled for C3-4, C4-5, C5-6 ACDF on 03/23/17 by Dr. Arnoldo Morale.  History includes never smoker, HTN, hypothyroidism, iron deficiency anemia, hyperlipidemia, DVT, diastolic dysfunction, aortic insufficiency (only trivial AI, mild MR, mild-moderate pulmonary hypertension 04/2016), diabetes mellitus type 2, GERD, hiatal hernia, fibromyalgia, depression, restless leg syndrome, insomnia, arthritis, nephrolithiasis, left knee uni-arthroplasty '10, right TKA '11, cholecystectomy, appendectomy '08, L4-S1 fusion 09/01/03 and L3-4 fusion 11/15/11, laparoscopic ascending colectomy (high grade dysplasia) 04/08/16, post-operative N/V. BMI is consistent with obesity. Normal coronaries by 2014 cath.   - PCP is Dr. Harlan Stains. - Endocrinologist is Dr. Buddy Duty. - She is not routinely followed by a cardiologist but has been evaluated by Dr. Fransico Him in the past, last in 2014 for chest pain evaluation. She had an abnormal stress test, but normal coronaries by cath.   Meds include albuterol, amlodipine, aspirin 81 mg on hold, baclofen, Flexeril, Cymbalta, Flonase, Lasix, Norco, NovoLin 70/30, levothyroxine, Lopressor (not taking), OxyIR, Protonix, promethazine, temazepam, valsartan.  BP (!) 168/62 Comment: taken manually/notified Recruitment consultant  Pulse 71   Temp 36.8 C   Resp 18   Ht 5\' 4"  (1.626 m)   Wt 208 lb 6.4 oz (94.5 kg)   SpO2 97%   BMI 35.77 kg/m   EKG 03/05/17: SR, PACs, LAD, low voltage precordial lead. Poor r wave progression. LAD is new when compared to 08/08/16 tracing.  Echo 04/12/16: Study Conclusions - Left ventricle: The cavity size was normal. Wall thickness was   increased in a pattern of mild LVH. Systolic function was normal.   The estimated ejection fraction was in the range of 60% to 65%.   Wall motion was normal; there were no regional wall motion   abnormalities. Doppler parameters are consistent with abnormal   left ventricular  relaxation (grade 1 diastolic dysfunction). - Aortic valve: There was trivial regurgitation. - Mitral valve: There was mild regurgitation. - Left atrium: The atrium was mildly dilated. - Pulmonary arteries: Systolic pressure was mildly to moderately   increased. PA peak pressure: 43 mm Hg (S). - Pericardium, extracardiac: A trivial pericardial effusion was   identified. Impressions: - Normal LV systolic function; grade 1 diastolic dysfunction; trace   AI; mild LAE; mild MR and TR; mild to moderate pulmonary   hypertension.  Nuclear stress test 05/02/13: IMPRESSION: 1.  Possible small area of pharmacologically induced ischemia involving the inferior lateral wall of the left ventricle. 2.  No scintigraphic evidence of prior infarction. 3.  Normal wall motion.  Ejection fraction - 63%. Cardiac cath recommended.  Cardiac cath 05/03/13: HEMODYNAMICS:  Aortic pressure was 093/81WEXH; LV systolic pressure was 371IRCV; LVEDP 81mmHg.  There was no gradient between the left ventricle and aorta.   ANGIOGRAPHIC DATA:   Left main: No angiographic CAD. Left anterior descending (LAD):  No angiographic CAD. Circumflex artery (CIRC):  No angiographic CAD. Right coronary artery (RCA):  No angiographic CAD. LEFT VENTRICULOGRAM:  Normal left ventricular wall motion and systolic function with an estimated ejection fraction of 60%.  IMPRESSIONS: 1. No angiographically significant CAD 2. Normal left ventricular systolic function.  LVEDP 18 mmHg.  Ejection fraction 60%.  CTA head/neck 10/29/14: IMPRESSION: 1. No acute intracranial process identified. 2. No acute abnormality identified within the major arterial vasculature of the head and neck. No evidence for dissection, hemodynamically significant stenosis, or vascular occlusion. 3. Mild atrophy with chronic microvascular ischemic disease.  MRI C-spine 03/05/17: IMPRESSION: - Motion degraded exam of relatively poor  diagnostic quality. - Multilevel  stenosis C4-C7 with cord flattening but no definite abnormal cord signal. - Symptomatic LEFT-sided neural impingement could be present at C3-4, C4-5, C5-6, and/or C6-7, due to a combination of disc space narrowing, bony overgrowth, and disc material; see discussion above. - If further investigation desired, cervical myelogram with postmyelogram CT, on elective basis, could be helpful in further evaluation.  Preoperative labs noted. Cr 0.99. Non-fasting glucose 368. A1c 9.6, consistent with average glucose of 229. She reports typical fasting CBGs ~ 75-120, with fasting CBG 03/21/17 of 155. H/H 9.7/32.3, which is up from 9.2/31.8 on 03/05/17 and overall consistent with results over the past year in [place]. T&S already done. Will add PRBC 2 Units, but defer decision for transfusion to surgeon and/or anesthesiologist.   At PAT, patient reported a few hours of nausea without fever, abdominal pain, vomiting or diarrhea. She takes phenergan as needed. Advised that she should be well for surgery and if persistent/worsening symptoms she should be evaluated and surgeon notified. I have notified Nikki at Dr. Arnoldo Morale' office and she will follow-up with patient. Also discussed patient's anemia and DM which is uncontrolled by A1c results. Random glucose also significantly elevated at PAT. Nikki to discuss labs with Dr. Arnoldo Morale or covering surgeon and if surgery remains as scheduled will advise patient that surgery may be cancelled if she arrives with a significantly elevated fasting CBG.    [facility]/Anesthesiology Phone [phone] 03/22/2017 10:39 AM

## 2017-03-21 NOTE — Progress Notes (Signed)
PCP - Dr. Aileen [facility]  Endocrine- Dr. Louanna [facility]  Cardiologist - Dr. Renford Dills [facility]  Chest x-ray - 03/05/17 (E)  EKG - 03/05/17 (E)  Stress Test - 05/01/13 (E)  ECHO - 04/12/16 (E)  Cardiac Cath - 05/03/13 (E)   Sleep Study - Denies CPAP - None  Fasting Blood Sugar - 365 Checks Blood Sugar ___1-2__ times a day  Chart will be given to anesthesia for review due to abnormal CBG, and pending lab results.  Pt denies having chest pain, sob, or fever at this time, but c/o nausea without abdominal pain, vomiting, diarrhea since last night. Pt sts she took her nausea medicine, but was only able to eat 4 crackers today. Anesthesia PA Ebony Hail made aware. Pt advised to call surgeon tomorrow if still feeling the same, and to be seen by a doctor if not feeling better. All instructions explained to the pt, with a verbal understanding of the material and verbal instructions. Pt agrees to go over the instructions while at home for a better understanding. The opportunity to ask questions was provided.

## 2017-03-22 LAB — HEMOGLOBIN A1C
HEMOGLOBIN A1C: 9.6 % — AB (ref 4.8–5.6)
MEAN PLASMA GLUCOSE: 229 mg/dL

## 2017-03-22 NOTE — Anesthesia Preprocedure Evaluation (Addendum)
Anesthesia Evaluation  Patient identified by MRN, date of birth, ID band Patient awake    Reviewed: Allergy & Precautions, H&P , NPO status , Patient's Chart, lab work & pertinent test results, reviewed documented beta blocker date and time   History of Anesthesia Complications (+) PONV  Airway Mallampati: III  TM Distance: >3 FB Neck ROM: Full    Dental no notable dental hx. (+) Teeth Intact, Dental Advisory Given   Pulmonary neg pulmonary ROS,    Pulmonary exam normal breath sounds clear to auscultation       Cardiovascular Exercise Tolerance: Good hypertension, Pt. on medications and Pt. on home beta blockers  Rhythm:Regular Rate:Normal     Neuro/Psych Depression negative neurological ROS     GI/Hepatic Neg liver ROS, hiatal hernia, GERD  Medicated and Controlled,  Endo/Other  diabetes, Insulin DependentHypothyroidism Morbid obesity  Renal/GU negative Renal ROS  negative genitourinary   Musculoskeletal  (+) Arthritis , Osteoarthritis,  Fibromyalgia -  Abdominal   Peds  Hematology negative hematology ROS (+) anemia ,   Anesthesia Other Findings   Reproductive/Obstetrics negative OB ROS                            Anesthesia Physical Anesthesia Plan  ASA: III  Anesthesia Plan: General   Post-op Pain Management:    Induction: Intravenous  PONV Risk Score and Plan: 4 or greater and Ondansetron, Dexamethasone, Midazolam, Scopolamine patch - Pre-op and Diphenhydramine  Airway Management Planned: Oral ETT  Additional Equipment:   Intra-op Plan:   Post-operative Plan: Extubation in OR  Informed Consent: I have reviewed the patients History and Physical, chart, labs and discussed the procedure including the risks, benefits and alternatives for the proposed anesthesia with the patient or authorized representative who has indicated his/her understanding and acceptance.   Dental  advisory given  Plan Discussed with: CRNA  Anesthesia Plan Comments:        Anesthesia Quick Evaluation

## 2017-03-23 ENCOUNTER — Inpatient Hospital Stay (HOSPITAL_COMMUNITY)
Admission: RE | Admit: 2017-03-23 | Discharge: 2017-03-24 | DRG: 473 | Disposition: A | Discharge status: Home / Self Care | Payer: Medicare Other | Source: Ambulatory Visit | Attending: Neurosurgery | Admitting: Neurosurgery

## 2017-03-23 ENCOUNTER — Inpatient Hospital Stay (HOSPITAL_COMMUNITY): Payer: Medicare Other | Admitting: Vascular Surgery

## 2017-03-23 ENCOUNTER — Encounter (HOSPITAL_COMMUNITY): Payer: Self-pay

## 2017-03-23 ENCOUNTER — Inpatient Hospital Stay (HOSPITAL_COMMUNITY): Payer: Medicare Other

## 2017-03-23 ENCOUNTER — Encounter (HOSPITAL_COMMUNITY)
Admission: RE | Disposition: A | Discharge status: Home / Self Care | Payer: Self-pay | Source: Ambulatory Visit | Attending: Neurosurgery

## 2017-03-23 DIAGNOSIS — Z8249 Family history of ischemic heart disease and other diseases of the circulatory system: Secondary | ICD-10-CM | POA: Diagnosis not present

## 2017-03-23 DIAGNOSIS — M542 Cervicalgia: Secondary | ICD-10-CM | POA: Diagnosis not present

## 2017-03-23 DIAGNOSIS — M50122 Cervical disc disorder at C5-C6 level with radiculopathy: Secondary | ICD-10-CM | POA: Diagnosis not present

## 2017-03-23 DIAGNOSIS — Z888 Allergy status to other drugs, medicaments and biological substances status: Secondary | ICD-10-CM | POA: Diagnosis not present

## 2017-03-23 DIAGNOSIS — Z981 Arthrodesis status: Secondary | ICD-10-CM | POA: Diagnosis not present

## 2017-03-23 DIAGNOSIS — E119 Type 2 diabetes mellitus without complications: Secondary | ICD-10-CM | POA: Diagnosis present

## 2017-03-23 DIAGNOSIS — Z833 Family history of diabetes mellitus: Secondary | ICD-10-CM

## 2017-03-23 DIAGNOSIS — E559 Vitamin D deficiency, unspecified: Secondary | ICD-10-CM | POA: Diagnosis present

## 2017-03-23 DIAGNOSIS — M797 Fibromyalgia: Secondary | ICD-10-CM | POA: Diagnosis present

## 2017-03-23 DIAGNOSIS — Z8349 Family history of other endocrine, nutritional and metabolic diseases: Secondary | ICD-10-CM | POA: Diagnosis not present

## 2017-03-23 DIAGNOSIS — Z7951 Long term (current) use of inhaled steroids: Secondary | ICD-10-CM

## 2017-03-23 DIAGNOSIS — M4722 Other spondylosis with radiculopathy, cervical region: Secondary | ICD-10-CM | POA: Diagnosis present

## 2017-03-23 DIAGNOSIS — Z803 Family history of malignant neoplasm of breast: Secondary | ICD-10-CM

## 2017-03-23 DIAGNOSIS — Z7982 Long term (current) use of aspirin: Secondary | ICD-10-CM

## 2017-03-23 DIAGNOSIS — M4802 Spinal stenosis, cervical region: Secondary | ICD-10-CM | POA: Diagnosis not present

## 2017-03-23 DIAGNOSIS — M4322 Fusion of spine, cervical region: Secondary | ICD-10-CM | POA: Diagnosis not present

## 2017-03-23 DIAGNOSIS — K219 Gastro-esophageal reflux disease without esophagitis: Secondary | ICD-10-CM | POA: Diagnosis present

## 2017-03-23 DIAGNOSIS — Z885 Allergy status to narcotic agent status: Secondary | ICD-10-CM

## 2017-03-23 DIAGNOSIS — G2581 Restless legs syndrome: Secondary | ICD-10-CM | POA: Diagnosis present

## 2017-03-23 DIAGNOSIS — M5011 Cervical disc disorder with radiculopathy,  high cervical region: Principal | ICD-10-CM | POA: Diagnosis present

## 2017-03-23 DIAGNOSIS — Z96653 Presence of artificial knee joint, bilateral: Secondary | ICD-10-CM | POA: Diagnosis present

## 2017-03-23 DIAGNOSIS — E785 Hyperlipidemia, unspecified: Secondary | ICD-10-CM | POA: Diagnosis present

## 2017-03-23 DIAGNOSIS — Z419 Encounter for procedure for purposes other than remedying health state, unspecified: Secondary | ICD-10-CM

## 2017-03-23 DIAGNOSIS — I10 Essential (primary) hypertension: Secondary | ICD-10-CM | POA: Diagnosis present

## 2017-03-23 DIAGNOSIS — Z881 Allergy status to other antibiotic agents status: Secondary | ICD-10-CM

## 2017-03-23 DIAGNOSIS — Z880 Allergy status to penicillin: Secondary | ICD-10-CM

## 2017-03-23 DIAGNOSIS — Z9049 Acquired absence of other specified parts of digestive tract: Secondary | ICD-10-CM

## 2017-03-23 DIAGNOSIS — M4712 Other spondylosis with myelopathy, cervical region: Secondary | ICD-10-CM | POA: Diagnosis not present

## 2017-03-23 DIAGNOSIS — Z794 Long term (current) use of insulin: Secondary | ICD-10-CM

## 2017-03-23 HISTORY — PX: ANTERIOR CERVICAL DECOMP/DISCECTOMY FUSION: SHX1161

## 2017-03-23 LAB — GLUCOSE, CAPILLARY
GLUCOSE-CAPILLARY: 99 mg/dL (ref 65–99)
GLUCOSE-CAPILLARY: 288 mg/dL — AB (ref 65–99)
GLUCOSE-CAPILLARY: 386 mg/dL — AB (ref 65–99)
Glucose-Capillary: 526 mg/dL (ref 65–99)

## 2017-03-23 LAB — PREPARE RBC (CROSSMATCH)

## 2017-03-23 SURGERY — ANTERIOR CERVICAL DECOMPRESSION/DISCECTOMY FUSION 3 LEVELS
Anesthesia: General | Site: Spine Cervical

## 2017-03-23 MED ORDER — DEXAMETHASONE 4 MG PO TABS: 4.0000 mg | ORAL_TABLET | Freq: Four times a day (QID) | ORAL | Status: DC

## 2017-03-23 MED ORDER — MIDAZOLAM HCL 2 MG/2ML IJ SOLN
INTRAMUSCULAR | Status: AC
  Filled 2017-03-23: qty 2

## 2017-03-23 MED ORDER — PHENOL 1.4 % MT LIQD
1.0000 | OROMUCOSAL | Status: DC | PRN
  Administered 2017-03-24: 1 via OROMUCOSAL
  Filled 2017-03-23: qty 177

## 2017-03-23 MED ORDER — CHLORHEXIDINE GLUCONATE CLOTH 2 % EX PADS: 6.0000 | MEDICATED_PAD | Freq: Once | CUTANEOUS | Status: DC

## 2017-03-23 MED ORDER — AMLODIPINE BESYLATE 5 MG PO TABS
5.0000 mg | ORAL_TABLET | Freq: Every day | ORAL | Status: DC
  Administered 2017-03-24: 5 mg via ORAL
  Filled 2017-03-23: qty 1

## 2017-03-23 MED ORDER — SODIUM CHLORIDE 0.9 % IV SOLN
1500.0000 mg | Freq: Once | INTRAVENOUS | Status: AC
  Administered 2017-03-23: 1500 mg via INTRAVENOUS
  Filled 2017-03-23: qty 1500

## 2017-03-23 MED ORDER — MENTHOL 3 MG MT LOZG: 1.0000 | LOZENGE | OROMUCOSAL | Status: DC | PRN

## 2017-03-23 MED ORDER — INSULIN ASPART 100 UNIT/ML ~~LOC~~ SOLN
0.0000 [IU] | SUBCUTANEOUS | Status: DC
  Administered 2017-03-23: 20 [IU] via SUBCUTANEOUS
  Administered 2017-03-23: 11 [IU] via SUBCUTANEOUS

## 2017-03-23 MED ORDER — PROPOFOL 10 MG/ML IV BOLUS
INTRAVENOUS | Status: AC
  Filled 2017-03-23: qty 40

## 2017-03-23 MED ORDER — FENTANYL CITRATE (PF) 250 MCG/5ML IJ SOLN
INTRAMUSCULAR | Status: AC
  Filled 2017-03-23: qty 5

## 2017-03-23 MED ORDER — ALBUMIN HUMAN 5 % IV SOLN
INTRAVENOUS | Status: DC | PRN
  Administered 2017-03-23 (×3): via INTRAVENOUS

## 2017-03-23 MED ORDER — MORPHINE SULFATE (PF) 4 MG/ML IV SOLN: 1.0000 mg | INTRAVENOUS | Status: DC | PRN

## 2017-03-23 MED ORDER — MORPHINE SULFATE (PF) 4 MG/ML IV SOLN: 4.0000 mg | INTRAVENOUS | Status: DC | PRN

## 2017-03-23 MED ORDER — THROMBIN 20000 UNITS EX SOLR
CUTANEOUS | Status: AC
  Filled 2017-03-23: qty 20000

## 2017-03-23 MED ORDER — INSULIN ASPART 100 UNIT/ML ~~LOC~~ SOLN
0.0000 [IU] | Freq: Three times a day (TID) | SUBCUTANEOUS | Status: DC
  Administered 2017-03-24: 7 [IU] via SUBCUTANEOUS

## 2017-03-23 MED ORDER — BACITRACIN ZINC 500 UNIT/GM EX OINT
TOPICAL_OINTMENT | CUTANEOUS | Status: DC | PRN
  Administered 2017-03-23: 1 via TOPICAL

## 2017-03-23 MED ORDER — SCOPOLAMINE 1 MG/3DAYS TD PT72
MEDICATED_PATCH | TRANSDERMAL | Status: AC
  Filled 2017-03-23: qty 1

## 2017-03-23 MED ORDER — IRBESARTAN 300 MG PO TABS
300.0000 mg | ORAL_TABLET | Freq: Every day | ORAL | Status: DC
  Administered 2017-03-24: 300 mg via ORAL
  Filled 2017-03-23 (×2): qty 1

## 2017-03-23 MED ORDER — INSULIN ASPART 100 UNIT/ML ~~LOC~~ SOLN
0.0000 [IU] | Freq: Every day | SUBCUTANEOUS | Status: DC
  Administered 2017-03-23: 4 [IU] via SUBCUTANEOUS

## 2017-03-23 MED ORDER — MIDAZOLAM HCL 5 MG/5ML IJ SOLN
INTRAMUSCULAR | Status: DC | PRN
  Administered 2017-03-23: 2 mg via INTRAVENOUS

## 2017-03-23 MED ORDER — ONDANSETRON HCL 4 MG/2ML IJ SOLN
INTRAMUSCULAR | Status: AC
  Filled 2017-03-23: qty 2

## 2017-03-23 MED ORDER — HYDROCODONE-ACETAMINOPHEN 5-325 MG PO TABS
2.0000 | ORAL_TABLET | Freq: Three times a day (TID) | ORAL | Status: DC | PRN
  Administered 2017-03-24: 2 via ORAL
  Filled 2017-03-23: qty 2

## 2017-03-23 MED ORDER — INSULIN ASPART PROT & ASPART (70-30 MIX) 100 UNIT/ML ~~LOC~~ SUSP
50.0000 [IU] | Freq: Two times a day (BID) | SUBCUTANEOUS | Status: DC
  Administered 2017-03-23 – 2017-03-24 (×2): 50 [IU] via SUBCUTANEOUS
  Filled 2017-03-23: qty 10

## 2017-03-23 MED ORDER — PROPOFOL 10 MG/ML IV BOLUS
INTRAVENOUS | Status: DC | PRN
  Administered 2017-03-23: 30 mg via INTRAVENOUS
  Administered 2017-03-23: 100 mg via INTRAVENOUS

## 2017-03-23 MED ORDER — BACITRACIN ZINC 500 UNIT/GM EX OINT
TOPICAL_OINTMENT | CUTANEOUS | Status: AC
  Filled 2017-03-23: qty 28.35

## 2017-03-23 MED ORDER — ALUM & MAG HYDROXIDE-SIMETH 200-200-20 MG/5ML PO SUSP: 30.0000 mL | Freq: Four times a day (QID) | ORAL | Status: DC | PRN

## 2017-03-23 MED ORDER — BUPIVACAINE-EPINEPHRINE (PF) 0.5% -1:200000 IJ SOLN
INTRAMUSCULAR | Status: AC
  Filled 2017-03-23: qty 30

## 2017-03-23 MED ORDER — PHENYLEPHRINE 40 MCG/ML (10ML) SYRINGE FOR IV PUSH (FOR BLOOD PRESSURE SUPPORT)
PREFILLED_SYRINGE | INTRAVENOUS | Status: DC | PRN
  Administered 2017-03-23: 80 ug via INTRAVENOUS
  Administered 2017-03-23: 160 ug via INTRAVENOUS
  Administered 2017-03-23 (×3): 80 ug via INTRAVENOUS

## 2017-03-23 MED ORDER — FENTANYL CITRATE (PF) 100 MCG/2ML IJ SOLN
INTRAMUSCULAR | Status: DC | PRN
  Administered 2017-03-23 (×7): 50 ug via INTRAVENOUS

## 2017-03-23 MED ORDER — PHENYLEPHRINE HCL 10 MG/ML IJ SOLN
INTRAVENOUS | Status: DC | PRN
  Administered 2017-03-23: 10:00:00 via INTRAVENOUS
  Administered 2017-03-23: 25 ug/min via INTRAVENOUS

## 2017-03-23 MED ORDER — PHENYLEPHRINE 40 MCG/ML (10ML) SYRINGE FOR IV PUSH (FOR BLOOD PRESSURE SUPPORT)
PREFILLED_SYRINGE | INTRAVENOUS | Status: AC
  Filled 2017-03-23: qty 10

## 2017-03-23 MED ORDER — SCOPOLAMINE 1 MG/3DAYS TD PT72
MEDICATED_PATCH | TRANSDERMAL | Status: DC | PRN
  Administered 2017-03-23: 1 via TRANSDERMAL

## 2017-03-23 MED ORDER — ROCURONIUM BROMIDE 10 MG/ML (PF) SYRINGE
PREFILLED_SYRINGE | INTRAVENOUS | Status: AC
  Filled 2017-03-23: qty 10

## 2017-03-23 MED ORDER — LACTATED RINGERS IV SOLN
INTRAVENOUS | Status: DC
  Administered 2017-03-23: 20:00:00 via INTRAVENOUS

## 2017-03-23 MED ORDER — LEVOTHYROXINE SODIUM 137 MCG PO TABS
137.0000 ug | ORAL_TABLET | Freq: Every day | ORAL | Status: DC
  Administered 2017-03-24: 137 ug via ORAL
  Filled 2017-03-23: qty 1

## 2017-03-23 MED ORDER — SUGAMMADEX SODIUM 200 MG/2ML IV SOLN
INTRAVENOUS | Status: DC | PRN
  Administered 2017-03-23: 189 mg via INTRAVENOUS

## 2017-03-23 MED ORDER — ACETAMINOPHEN 650 MG RE SUPP: 650.0000 mg | RECTAL | Status: DC | PRN

## 2017-03-23 MED ORDER — ACETAMINOPHEN 325 MG PO TABS: 650.0000 mg | ORAL_TABLET | ORAL | Status: DC | PRN

## 2017-03-23 MED ORDER — SODIUM CHLORIDE 0.9 % IR SOLN
Status: DC | PRN
  Administered 2017-03-23: 500 mL

## 2017-03-23 MED ORDER — LIDOCAINE 2% (20 MG/ML) 5 ML SYRINGE
INTRAMUSCULAR | Status: AC
  Filled 2017-03-23: qty 5

## 2017-03-23 MED ORDER — LIDOCAINE 2% (20 MG/ML) 5 ML SYRINGE
INTRAMUSCULAR | Status: DC | PRN
  Administered 2017-03-23: 60 mg via INTRAVENOUS

## 2017-03-23 MED ORDER — FLUTICASONE PROPIONATE 50 MCG/ACT NA SUSP
2.0000 | Freq: Every day | NASAL | Status: DC | PRN
  Filled 2017-03-23: qty 16

## 2017-03-23 MED ORDER — DULOXETINE HCL 30 MG PO CPEP
60.0000 mg | ORAL_CAPSULE | Freq: Every day | ORAL | Status: DC
  Administered 2017-03-24: 60 mg via ORAL
  Filled 2017-03-23: qty 2

## 2017-03-23 MED ORDER — LACTATED RINGERS IV SOLN
INTRAVENOUS | Status: DC | PRN
  Administered 2017-03-23 (×2): via INTRAVENOUS

## 2017-03-23 MED ORDER — ALBUTEROL SULFATE (2.5 MG/3ML) 0.083% IN NEBU
3.0000 mL | INHALATION_SOLUTION | RESPIRATORY_TRACT | Status: DC | PRN
  Filled 2017-03-23: qty 3

## 2017-03-23 MED ORDER — CYCLOBENZAPRINE HCL 10 MG PO TABS
10.0000 mg | ORAL_TABLET | Freq: Three times a day (TID) | ORAL | Status: DC | PRN
  Administered 2017-03-23: 10 mg via ORAL
  Filled 2017-03-23: qty 1

## 2017-03-23 MED ORDER — ROCURONIUM BROMIDE 10 MG/ML (PF) SYRINGE
PREFILLED_SYRINGE | INTRAVENOUS | Status: DC | PRN
  Administered 2017-03-23: 20 mg via INTRAVENOUS
  Administered 2017-03-23: 30 mg via INTRAVENOUS
  Administered 2017-03-23: 10 mg via INTRAVENOUS
  Administered 2017-03-23: 30 mg via INTRAVENOUS
  Administered 2017-03-23: 50 mg via INTRAVENOUS

## 2017-03-23 MED ORDER — OXYCODONE HCL 5 MG PO TABS
5.0000 mg | ORAL_TABLET | ORAL | Status: DC | PRN
  Administered 2017-03-23 (×2): 10 mg via ORAL
  Filled 2017-03-23 (×2): qty 2

## 2017-03-23 MED ORDER — DEXAMETHASONE SODIUM PHOSPHATE 10 MG/ML IJ SOLN
INTRAMUSCULAR | Status: AC
  Filled 2017-03-23: qty 1

## 2017-03-23 MED ORDER — FUROSEMIDE 20 MG PO TABS: 20.0000 mg | ORAL_TABLET | Freq: Every day | ORAL | Status: DC | PRN

## 2017-03-23 MED ORDER — DEXAMETHASONE SODIUM PHOSPHATE 4 MG/ML IJ SOLN
2.0000 mg | Freq: Four times a day (QID) | INTRAMUSCULAR | Status: DC
  Administered 2017-03-23: 2 mg via INTRAVENOUS
  Filled 2017-03-23: qty 1

## 2017-03-23 MED ORDER — BUPIVACAINE-EPINEPHRINE (PF) 0.5% -1:200000 IJ SOLN
INTRAMUSCULAR | Status: DC | PRN
  Administered 2017-03-23: 10 mL

## 2017-03-23 MED ORDER — EPHEDRINE SULFATE-NACL 50-0.9 MG/10ML-% IV SOSY
PREFILLED_SYRINGE | INTRAVENOUS | Status: DC | PRN
  Administered 2017-03-23 (×3): 10 mg via INTRAVENOUS
  Administered 2017-03-23: 15 mg via INTRAVENOUS
  Administered 2017-03-23: 5 mg via INTRAVENOUS

## 2017-03-23 MED ORDER — NOREPINEPHRINE BITARTRATE 1 MG/ML IV SOLN
0.0000 ug/min | INTRAVENOUS | Status: AC
  Administered 2017-03-23: 2 ug/min via INTRAVENOUS
  Filled 2017-03-23: qty 4

## 2017-03-23 MED ORDER — ONDANSETRON HCL 4 MG PO TABS: 4.0000 mg | ORAL_TABLET | Freq: Four times a day (QID) | ORAL | Status: DC | PRN

## 2017-03-23 MED ORDER — SURGIFOAM 100 EX MISC
CUTANEOUS | Status: DC | PRN
  Administered 2017-03-23: 20 mL via TOPICAL

## 2017-03-23 MED ORDER — EPHEDRINE 5 MG/ML INJ
INTRAVENOUS | Status: AC
  Filled 2017-03-23: qty 10

## 2017-03-23 MED ORDER — PROMETHAZINE HCL 25 MG PO TABS: 25.0000 mg | ORAL_TABLET | Freq: Three times a day (TID) | ORAL | Status: DC | PRN

## 2017-03-23 MED ORDER — DIPHENHYDRAMINE HCL 25 MG PO CAPS
25.0000 mg | ORAL_CAPSULE | ORAL | Status: DC | PRN
  Administered 2017-03-23: 25 mg via ORAL
  Filled 2017-03-23: qty 1

## 2017-03-23 MED ORDER — 0.9 % SODIUM CHLORIDE (POUR BTL) OPTIME
TOPICAL | Status: DC | PRN
  Administered 2017-03-23: 1000 mL

## 2017-03-23 MED ORDER — DIPHENHYDRAMINE HCL 50 MG/ML IJ SOLN
INTRAMUSCULAR | Status: DC | PRN
  Administered 2017-03-23: 12.5 mg via INTRAVENOUS

## 2017-03-23 MED ORDER — TEMAZEPAM 15 MG PO CAPS
30.0000 mg | ORAL_CAPSULE | Freq: Every day | ORAL | Status: DC
  Administered 2017-03-23: 30 mg via ORAL
  Filled 2017-03-23: qty 2

## 2017-03-23 MED ORDER — VANCOMYCIN HCL IN DEXTROSE 1-5 GM/200ML-% IV SOLN
INTRAVENOUS | Status: AC
  Filled 2017-03-23: qty 200

## 2017-03-23 MED ORDER — ARTIFICIAL TEARS OPHTHALMIC OINT
TOPICAL_OINTMENT | OPHTHALMIC | Status: AC
  Filled 2017-03-23: qty 3.5

## 2017-03-23 MED ORDER — DOCUSATE SODIUM 100 MG PO CAPS
100.0000 mg | ORAL_CAPSULE | Freq: Two times a day (BID) | ORAL | Status: DC
  Administered 2017-03-23 – 2017-03-24 (×2): 100 mg via ORAL
  Filled 2017-03-23 (×2): qty 1

## 2017-03-23 MED ORDER — BISACODYL 10 MG RE SUPP: 10.0000 mg | Freq: Every day | RECTAL | Status: DC | PRN

## 2017-03-23 MED ORDER — DEXAMETHASONE SODIUM PHOSPHATE 10 MG/ML IJ SOLN
INTRAMUSCULAR | Status: DC | PRN
  Administered 2017-03-23: 5 mg via INTRAVENOUS

## 2017-03-23 MED ORDER — DIPHENHYDRAMINE HCL 50 MG/ML IJ SOLN
INTRAMUSCULAR | Status: AC
  Filled 2017-03-23: qty 1

## 2017-03-23 MED ORDER — THROMBIN 5000 UNITS EX SOLR
CUTANEOUS | Status: AC
  Filled 2017-03-23: qty 5000

## 2017-03-23 MED ORDER — PANTOPRAZOLE SODIUM 40 MG PO TBEC
40.0000 mg | DELAYED_RELEASE_TABLET | Freq: Two times a day (BID) | ORAL | Status: DC
  Administered 2017-03-23 – 2017-03-24 (×2): 40 mg via ORAL
  Filled 2017-03-23 (×2): qty 1

## 2017-03-23 MED ORDER — ONDANSETRON HCL 4 MG/2ML IJ SOLN
INTRAMUSCULAR | Status: DC | PRN
  Administered 2017-03-23: 4 mg via INTRAVENOUS

## 2017-03-23 MED ORDER — THROMBIN 5000 UNITS EX SOLR
OROMUCOSAL | Status: DC | PRN
  Administered 2017-03-23: 5 mL via TOPICAL

## 2017-03-23 MED ORDER — ONDANSETRON HCL 4 MG/2ML IJ SOLN: 4.0000 mg | Freq: Four times a day (QID) | INTRAMUSCULAR | Status: DC | PRN

## 2017-03-23 MED ORDER — VANCOMYCIN HCL IN DEXTROSE 1-5 GM/200ML-% IV SOLN
1000.0000 mg | INTRAVENOUS | Status: AC
  Administered 2017-03-23: 1000 mg via INTRAVENOUS

## 2017-03-23 SURGICAL SUPPLY — 69 items
BAG DECANTER FOR FLEXI CONT (MISCELLANEOUS) ×2 IMPLANT
BENZOIN TINCTURE PRP APPL 2/3 (GAUZE/BANDAGES/DRESSINGS) ×2 IMPLANT
BIT DRILL NEURO 2X3.1 SFT TUCH (MISCELLANEOUS) ×1 IMPLANT
BLADE SURG 15 STRL LF DISP TIS (BLADE) ×1 IMPLANT
BLADE SURG 15 STRL SS (BLADE) ×1
BLADE ULTRA TIP 2M (BLADE) ×2 IMPLANT
BUR BARREL STRAIGHT FLUTE 4.0 (BURR) ×2 IMPLANT
BUR MATCHSTICK NEURO 3.0 LAGG (BURR) ×2 IMPLANT
CAGE PEEK VISTAS 11X14X6 (Cage) ×2 IMPLANT
CANISTER SUCT 3000ML PPV (MISCELLANEOUS) ×2 IMPLANT
CARTRIDGE OIL MAESTRO DRILL (MISCELLANEOUS) ×1 IMPLANT
CLSR STERI-STRIP ANTIMIC 1/2X4 (GAUZE/BANDAGES/DRESSINGS) ×2 IMPLANT
COVER MAYO STAND STRL (DRAPES) ×2 IMPLANT
DIFFUSER DRILL AIR PNEUMATIC (MISCELLANEOUS) ×2 IMPLANT
DRAPE LAPAROTOMY 100X72 PEDS (DRAPES) ×2 IMPLANT
DRAPE MICROSCOPE LEICA (MISCELLANEOUS) IMPLANT
DRAPE POUCH INSTRU U-SHP 10X18 (DRAPES) ×2 IMPLANT
DRAPE SURG 17X23 STRL (DRAPES) ×4 IMPLANT
DRILL NEURO 2X3.1 SOFT TOUCH (MISCELLANEOUS) ×2
ELECT REM PT RETURN 9FT ADLT (ELECTROSURGICAL) ×2
ELECTRODE REM PT RTRN 9FT ADLT (ELECTROSURGICAL) ×1 IMPLANT
GAUZE SPONGE 4X4 12PLY STRL (GAUZE/BANDAGES/DRESSINGS) IMPLANT
GAUZE SPONGE 4X4 12PLY STRL LF (GAUZE/BANDAGES/DRESSINGS) ×2 IMPLANT
GAUZE SPONGE 4X4 16PLY XRAY LF (GAUZE/BANDAGES/DRESSINGS) IMPLANT
GLOVE BIO SURGEON STRL SZ8 (GLOVE) ×2 IMPLANT
GLOVE BIO SURGEON STRL SZ8.5 (GLOVE) ×2 IMPLANT
GLOVE BIOGEL PI IND STRL 7.0 (GLOVE) ×1 IMPLANT
GLOVE BIOGEL PI IND STRL 8 (GLOVE) ×1 IMPLANT
GLOVE BIOGEL PI IND STRL 8.5 (GLOVE) ×1 IMPLANT
GLOVE BIOGEL PI INDICATOR 7.0 (GLOVE) ×1
GLOVE BIOGEL PI INDICATOR 8 (GLOVE) ×1
GLOVE BIOGEL PI INDICATOR 8.5 (GLOVE) ×1
GLOVE ECLIPSE 7.5 STRL STRAW (GLOVE) ×6 IMPLANT
GLOVE ECLIPSE 8.5 STRL (GLOVE) ×2 IMPLANT
GLOVE EXAM NITRILE LRG STRL (GLOVE) IMPLANT
GLOVE EXAM NITRILE XL STR (GLOVE) IMPLANT
GLOVE EXAM NITRILE XS STR PU (GLOVE) IMPLANT
GOWN STRL REUS W/ TWL LRG LVL3 (GOWN DISPOSABLE) IMPLANT
GOWN STRL REUS W/ TWL XL LVL3 (GOWN DISPOSABLE) ×1 IMPLANT
GOWN STRL REUS W/TWL 2XL LVL3 (GOWN DISPOSABLE) ×4 IMPLANT
GOWN STRL REUS W/TWL LRG LVL3 (GOWN DISPOSABLE)
GOWN STRL REUS W/TWL XL LVL3 (GOWN DISPOSABLE) ×1
HEMOSTAT POWDER KIT SURGIFOAM (HEMOSTASIS) ×2 IMPLANT
KIT BASIN OR (CUSTOM PROCEDURE TRAY) ×2 IMPLANT
KIT ROOM TURNOVER OR (KITS) ×2 IMPLANT
MARKER SKIN DUAL TIP RULER LAB (MISCELLANEOUS) ×2 IMPLANT
NEEDLE HYPO 22GX1.5 SAFETY (NEEDLE) ×2 IMPLANT
NEEDLE SPNL 18GX3.5 QUINCKE PK (NEEDLE) ×2 IMPLANT
NS IRRIG 1000ML POUR BTL (IV SOLUTION) ×2 IMPLANT
OIL CARTRIDGE MAESTRO DRILL (MISCELLANEOUS) ×2
PACK LAMINECTOMY NEURO (CUSTOM PROCEDURE TRAY) ×2 IMPLANT
PATTIES SURGICAL .5 X.5 (GAUZE/BANDAGES/DRESSINGS) ×2 IMPLANT
PEEK OPTIMA VISTA-S 11X14X5MM (Peek) ×4 IMPLANT
PIN DISTRACTION 14MM (PIN) ×4 IMPLANT
PLATE ANT CERV XTEND 3 LV 45 (Plate) ×2 IMPLANT
PUTTY KINEX BIOACTIVE 5CC (Bone Implant) ×2 IMPLANT
RUBBERBAND STERILE (MISCELLANEOUS) IMPLANT
SCREW XTD VAR 4.2 SELF TAP 12 (Screw) ×16 IMPLANT
SPONGE INTESTINAL PEANUT (DISPOSABLE) ×6 IMPLANT
SPONGE SURGIFOAM ABS GEL 100 (HEMOSTASIS) ×2 IMPLANT
STRIP CLOSURE SKIN 1/2X4 (GAUZE/BANDAGES/DRESSINGS) ×2 IMPLANT
SUT VIC AB 0 CT1 27 (SUTURE) ×1
SUT VIC AB 0 CT1 27XBRD ANTBC (SUTURE) ×1 IMPLANT
SUT VIC AB 3-0 SH 8-18 (SUTURE) ×2 IMPLANT
SYR BULB 3OZ (MISCELLANEOUS) ×2 IMPLANT
TAPE CLOTH SURG 4X10 WHT LF (GAUZE/BANDAGES/DRESSINGS) ×2 IMPLANT
TOWEL GREEN STERILE (TOWEL DISPOSABLE) ×2 IMPLANT
TOWEL GREEN STERILE FF (TOWEL DISPOSABLE) ×2 IMPLANT
WATER STERILE IRR 1000ML POUR (IV SOLUTION) ×2 IMPLANT

## 2017-03-23 NOTE — Progress Notes (Signed)
Pharmacy Antibiotic Note  [name] is a 66 y.o. female admitted on 03/23/2017 with surgical prophylaxis.  Pharmacy has been consulted for vancomycin dosing.   Pre-op vancomycin 1g given at 0740. No drain in place.  Plan: Vancomycin 1500mg  IV x1 at [facility] Capo to sign off as no future doses required  Height: 5\' 4"  (162.6 cm) Weight: 208 lb 6.4 oz (94.5 kg) IBW/kg (Calculated) : 54.7  Temp (24hrs), Avg:97.8 F (36.6 C), Min:97.5 F (36.4 C), Max:98.2 F (36.8 C)   Recent Labs Lab 03/21/17 1127  WBC 7.9  CREATININE 0.99    Estimated Creatinine Clearance: 62.3 mL/min (by C-G formula based on SCr of 0.99 mg/dL).    Allergies  Allergen Reactions  . Codeine Nausea And Vomiting  . Ambien [Zolpidem Tartrate] Other (See Comments)    Up sleep walking and eating  . Crestor [Rosuvastatin] Other (See Comments)    Muscle weakness, cramps and aching all over body  . Lipitor [Atorvastatin] Itching and Other (See Comments)    Muscle weakness, cramps and aches all over body  . Morphine And Related Other (See Comments)    Flushing and feeling hot Tolerates with Diphenhydramine  . Penicillins Rash and Other (See Comments)    Caused Headaches also Has patient had a PCN reaction causing immediate rash, facial/tongue/throat swelling, SOB or lightheadedness with hypotension: No Has patient had a PCN reaction causing severe rash involving mucus membranes or skin necrosis: No Has patient had a PCN reaction that required hospitalization No Has patient had a PCN reaction occurring within the last 10 years: No If all of the above answers are "NO", then may proceed with Cephalosporin use.    . Statins Other (See Comments)    Muscle weakness, cramps and aching all over body  . Dilaudid [Hydromorphone Hcl] Other (See Comments)    Hallucinations/ argumentative, goes beserk  . Amitriptyline Other (See Comments)    Loopy feeling  . Ceftin [Cefuroxime Axetil] Other (See Comments)    Stomach  pain   . Lovaza [Omega-3-Acid Ethyl Esters] Other (See Comments)    Stomach pain   . Lunesta [Eszopiclone] Other (See Comments)    Causes dizziness  . Lyrica [Pregabalin] Nausea Only  . Metformin And Related Nausea Only    Pt denies this intolerance     Thank you for allowing pharmacy to be a part of this patient's care.  Kayline Sheer 03/23/2017 1:20 PM

## 2017-03-23 NOTE — Anesthesia Postprocedure Evaluation (Signed)
Anesthesia Post Note  Patient: [name]  Procedure(s) Performed: Procedure(s) (LRB): ANTERIOR CERVICAL DECOMPRESSION/DISCECTOMY FUSION, INTERBODY PROSTHESIS,PLATE CERVICAL THREE- CERVICAL FOUR, CERVICAL FOUR- CERVICAL FIVE, CERVICAL FIVE- CERVICAL SIX (N/A)     Patient location during evaluation: PACU Anesthesia Type: General Level of consciousness: awake and alert Pain management: pain level controlled Vital Signs Assessment: post-procedure vital signs reviewed and stable Respiratory status: spontaneous breathing, nonlabored ventilation, respiratory function stable and patient connected to nasal cannula oxygen Cardiovascular status: blood pressure returned to baseline and stable Postop Assessment: no signs of nausea or vomiting Anesthetic complications: no    Last Vitals:  Vitals:   03/23/17 1140 03/23/17 1158  BP: 136/70 (!) 117/53  Pulse: 75 72  Resp: (!) 23 20  Temp:    SpO2: 92% 94%    Last Pain:  Vitals:   03/23/17 1130  PainSc: 0-No pain                 Tom Macpherson,W. EDMOND

## 2017-03-23 NOTE — H&P (Signed)
Subjective: The patient is a 66 year old white female with multiple medical problems who has had previous lumbar surgeries. She has developed neck and left arm pain consistent with a cervical radiculopathy. She has failed medical management and was worked up with a cervical MRI. This demonstrated multilevel spondylosis, herniated disc, stenosis, etc. I discussed the various treatment options. She has decided to proceed with surgery.   Past Medical History:  Diagnosis Date  . Anemia    iron deficiency  . Arthritis   . Blood transfusion   . Cervical spondylosis with myelopathy and radiculopathy   . Depression   . Diabetes mellitus    Type II  . Diastolic dysfunction   . Diverticulitis   . DJD (degenerative joint disease)   . DVT (deep venous thrombosis) (HCC)    times 2 lower leg  . Endometriosis   . Family history of adverse reaction to anesthesia    mom and dad PONV  . Fibromyalgia   . GERD (gastroesophageal reflux disease)    occ  . Hiatal hernia   . History of kidney stones   . Hyperlipidemia   . Hypertension   . Hypothyroidism   . Insomnia   . Mild aortic insufficiency    by echo 04/2013  . Osteoarthritis    back and knee  . Ovarian cyst   . PONV (postoperative nausea and vomiting)   . RLS (restless legs syndrome)   . Thoracic compression fracture ([place])   . Uterine fibroid   . UTI (lower urinary tract infection)   . Villous adenoma of right colon 04/08/2016  . Vitamin D deficiency disease     Past Surgical History:  Procedure Laterality Date  . APPENDECTOMY    . BACK SURGERY  ,2005   April  2013 - [facility] fusion[facility]  . BREAST SURGERY     breast reduction  . CARDIAC CATHETERIZATION  04/2013   normal coronary arteries and normal LVF  . CARPAL TUNNEL RELEASE  06/05/2012   Procedure: CARPAL TUNNEL RELEASE;  Surgeon: Cammie Sickle., MD;  Location: [place];  Service: Orthopedics;  Laterality: Left;  . CESAREAN SECTION     x 2  . CHOLECYSTECTOMY     . COLECTOMY  04/08/2016  . DILATION AND CURETTAGE OF UTERUS    . DORSAL COMPARTMENT RELEASE  06/05/2012   Procedure: RELEASE DORSAL COMPARTMENT (DEQUERVAIN);  Surgeon: Cammie Sickle., MD;  Location[facility];  Service: Orthopedics;  Laterality: Left;  Excision of mixoid cyst also  . EYE SURGERY     cataracts bilateral  . JOINT REPLACEMENT     Bilateral knee  . KNEE ARTHROPLASTY  09   lft partial  . KNEE ARTHROPLASTY     rt  . LAPAROSCOPIC PARTIAL COLECTOMY N/A 04/08/2016   Procedure: LAPAROSCOPIC ASSISTED ASCENDING COLECTOMY POSSIBLE OPEN COLECTOMY;  Surgeon: Fanny Skates, MD;  Location: [place];  Service: General;  Laterality: N/A;  . orthopedic surgeries     multiple  . SHOULDER OPEN ROTATOR CUFF REPAIR     rt and lft  . TUBAL LIGATION     btsp    Allergies  Allergen Reactions  . Codeine Nausea And Vomiting  . Ambien [Zolpidem Tartrate] Other (See Comments)    Up sleep walking and eating  . Crestor [Rosuvastatin] Other (See Comments)    Muscle weakness, cramps and aching all over body  . Lipitor [Atorvastatin] Itching and Other (See Comments)    Muscle weakness, cramps and  aches all over body  . Morphine And Related Other (See Comments)    Flushing and feeling hot Tolerates with Diphenhydramine  . Penicillins Rash and Other (See Comments)    Caused Headaches also Has patient had a PCN reaction causing immediate rash, facial/tongue/throat swelling, SOB or lightheadedness with hypotension: No Has patient had a PCN reaction causing severe rash involving mucus membranes or skin necrosis: No Has patient had a PCN reaction that required hospitalization No Has patient had a PCN reaction occurring within the last 10 years: No If all of the above answers are "NO", then may proceed with Cephalosporin use.    . Statins Other (See Comments)    Muscle weakness, cramps and aching all over body  . Dilaudid [Hydromorphone Hcl] Other (See Comments)     Hallucinations/ argumentative, goes beserk  . Amitriptyline Other (See Comments)    Loopy feeling  . Ceftin [Cefuroxime Axetil] Other (See Comments)    Stomach pain   . Lovaza [Omega-3-Acid Ethyl Esters] Other (See Comments)    Stomach pain   . Lunesta [Eszopiclone] Other (See Comments)    Causes dizziness  . Lyrica [Pregabalin] Nausea Only  . Metformin And Related Nausea Only    Pt denies this intolerance    Social History  Substance Use Topics  . Smoking status: Never Smoker  . Smokeless tobacco: Never Used  . Alcohol use No    Family History  Problem Relation Age of Onset  . Heart attack Father        44s  . Hypothyroidism Father   . Diabetes type II Father   . Diabetes type II Mother   . Hypertension Mother   . Breast cancer Mother 51  . Hypothyroidism Brother    Prior to Admission medications   Medication Sig Start Date End Date Taking? Authorizing Provider  acetaminophen (TYLENOL) 500 MG tablet Take 1,000 mg by mouth every 8 (eight) hours as needed for mild pain or headache.   Yes [provider]  amLODipine (NORVASC) 5 MG tablet Take 5 mg by mouth daily.   Yes [provider]  aspirin 81 MG chewable tablet Chew 81 mg by mouth daily.   Yes [provider]  baclofen (LIORESAL) 10 MG tablet Take 1 tablet (10 mg total) by mouth 3 (three) times daily. Patient taking differently: Take 10 mg by mouth daily as needed for muscle spasms.  03/05/17  Yes Margarita Mail, PA-C  cholecalciferol (VITAMIN D) 1000 units tablet Take 1,000 Units by mouth daily.   Yes [provider]  clobetasol (TEMOVATE) 0.05 % external solution Apply 1 application topically See admin instructions. Apply twice daily to scalp and ear as needed for itching/rash (do not apply to face/groin/underarms) 07/13/16  Yes [provider]  cyclobenzaprine (FLEXERIL) 10 MG tablet Take 10 mg by mouth 3 (three) times daily as needed for muscle spasms.   Yes [provider]  DULoxetine (CYMBALTA) 60 MG capsule Take 60 mg by mouth daily.   Yes [provider]  fluticasone (FLONASE) 50 MCG/ACT nasal spray Place 2 sprays into both nostrils daily as needed for allergies or rhinitis.   Yes [provider]  furosemide (LASIX) 20 MG tablet Take 20 mg by mouth daily as needed for fluid or edema.    Yes [provider]  HYDROcodone-acetaminophen (NORCO/VICODIN) 5-325 MG tablet Take 2 tablets by mouth 3 (three) times daily as needed for moderate pain.  03/02/17  Yes [provider]  insulin aspart protamine-  aspart (NOVOLOG MIX 70/30) (70-30) 100 UNIT/ML injection Inject 0.3 mLs (30 Units total) into the skin 2 (two) times daily with a meal. Patient taking differently: Inject 50 Units into the skin 2 (two) times daily with a meal.  04/12/16  Yes Reyne Dumas, MD  levothyroxine (SYNTHROID, LEVOTHROID) 137 MCG tablet Take 137 mcg by mouth daily before breakfast.   Yes [provider]  oxyCODONE (OXY IR/ROXICODONE) 5 MG immediate release tablet Take 1 tablet (5 mg total) by mouth every 4 (four) hours as needed for severe pain. Patient taking differently: Take 5 mg by mouth 3 (three) times daily as needed for severe pain.  03/05/17  Yes Harris, Abigail, PA-C  pantoprazole (PROTONIX) 40 MG tablet Take 40 mg by mouth 2 (two) times daily.   Yes [provider]  promethazine (PHENERGAN) 25 MG tablet Take 25 mg by mouth every 8 (eight) hours as needed for nausea or vomiting.   Yes [provider]  temazepam (RESTORIL) 30 MG capsule Take 30 mg by mouth at bedtime.    Yes [provider]  valsartan (DIOVAN) 320 MG tablet Take 320 mg by mouth daily.   Yes [provider]  vitamin B-12 (CYANOCOBALAMIN) 1000 MCG tablet Take 1,000 mcg by mouth daily.   Yes [provider]  albuterol (PROVENTIL HFA;VENTOLIN HFA) 108 (90 Base) MCG/ACT inhaler Inhale 2 puffs into the lungs every 4 (four) hours as  needed for wheezing or shortness of breath. 08/08/16   Joy, Shawn C, PA-C  benzonatate (TESSALON) 100 MG capsule Take 1 capsule (100 mg total) by mouth every 8 (eight) hours. Patient taking differently: Take 100 mg by mouth 3 (three) times daily as needed for cough.  08/08/16   Joy, Shawn C, PA-C  diazepam (VALIUM) 2 MG tablet Take 0.5 tablets (1 mg total) by mouth every 12 (twelve) hours as needed for anxiety or muscle spasms. Patient not taking: Reported on 03/05/2017 04/12/16   Reyne Dumas, MD  metoprolol tartrate (LOPRESSOR) 25 MG tablet Take 0.5 tablets (12.5 mg total) by mouth daily. Patient not taking: Reported on 08/08/2016 04/12/16   Reyne Dumas, MD  Vitamin D, Ergocalciferol, (DRISDOL) 50000 UNITS CAPS capsule TAKE ONE CAPSULE BY MOUTH EVERY OTHER WEEK Patient not taking: Reported on 08/08/2016 04/22/13   Regina Eck, CNM     Review of Systems  Positive ROS: As above  All other systems have been reviewed and were otherwise negative with the exception of those mentioned in the HPI and as above.  Objective: Vital signs in last 24 hours: Temp:  [98.2 F (36.8 C)] 98.2 F (36.8 C) (08/16 0634) Pulse Rate:  [102] 102 (08/16 0634) Resp:  [18] 18 (08/16 0634) BP: (180)/(74) 180/74 (08/16 0634) SpO2:  [96 %] 96 % (08/16 0634) Weight:  [94.5 kg (208 lb 6.4 oz)] 94.5 kg (208 lb 6.4 oz) (08/16 0554)  General Appearance: Alert, obese Head: Normocephalic, without obvious abnormality, atraumatic Eyes: PERRL, conjunctiva/corneas clear, EOM's intact,    Ears: Normal  Throat: Normal  Neck: Supple, Back: unremarkable, her lumbar incision is well-healed. Lungs: Clear to auscultation bilaterally, respirations unlabored Heart: Regular rate and rhythm, no murmur, rub or gallop Abdomen: Soft, non-tender Extremities: Extremities normal, atraumatic, no cyanosis or edema Skin: unremarkable  NEUROLOGIC:   Mental status: alert and oriented,Motor Exam - grossly normal Sensory Exam - grossly  normal Reflexes:  Coordination - grossly normal Gait - grossly normal Balance - grossly normal Cranial Nerves: I: smell Not tested  II: visual  acuity  OS: Normal  OD: Normal   II: visual fields Full to confrontation  II: pupils Equal, round, reactive to light  III,VII: ptosis None  III,IV,VI: extraocular muscles  Full ROM  V: mastication Normal  V: facial light touch sensation  Normal  V,VII: corneal reflex  Present  VII: facial muscle function - upper  Normal  VII: facial muscle function - lower Normal  VIII: hearing Not tested  IX: soft palate elevation  Normal  IX,X: gag reflex Present  XI: trapezius strength  5/5  XI: sternocleidomastoid strength 5/5  XI: neck flexion strength  5/5  XII: tongue strength  Normal    Data Review Lab Results  Component Value Date   WBC 7.9 03/21/2017   HGB 9.7 (L) 03/21/2017   HCT 32.3 (L) 03/21/2017   MCV 64.2 (L) 03/21/2017   PLT 283 03/21/2017   Lab Results  Component Value Date   NA 135 03/21/2017   K 4.0 03/21/2017   CL 100 (L) 03/21/2017   CO2 26 03/21/2017   BUN 7 03/21/2017   CREATININE 0.99 03/21/2017   GLUCOSE 368 (H) 03/21/2017   Lab Results  Component Value Date   INR 0.95 05/02/2013    Assessment/Plan: C3-4, C4-5, C5-6 disc degeneration, spondylosis, herniated disc, cervicalgia, cervical radiculopathy: I discussed the situation with the patient. I have reviewed her imaging studies with her. We have discussed the various treatment options including surgery. I have described the surgical treatment option of the C3-4, C4-5 and C5-6 anterior cervical discectomy, fusion, and plating. I have shown her surgical models. We have discussed the risks, benefits, alternatives, expected postoperative course, and likelihood of achieving her goals with surgery. I have answered all patient's questions. She has decided to proceed with surgery.   Maezie Justin D 03/23/2017 7:21 AM

## 2017-03-23 NOTE — Transfer of Care (Signed)
Immediate Anesthesia Transfer of Care Note  Patient: [name]  Procedure(s) Performed: Procedure(s): ANTERIOR CERVICAL DECOMPRESSION/DISCECTOMY FUSION, INTERBODY PROSTHESIS,PLATE CERVICAL THREE- CERVICAL FOUR, CERVICAL FOUR- CERVICAL FIVE, CERVICAL FIVE- CERVICAL SIX (N/A)  Patient Location: PACU  Anesthesia Type:General  Level of Consciousness: alert , oriented, drowsy and patient cooperative  Airway & Oxygen Therapy: Patient Spontanous Breathing and Patient connected to face mask oxygen  Post-op Assessment: Report given to RN and Post -op Vital signs reviewed and stable  Post vital signs: Reviewed and stable  Last Vitals:  Vitals:   03/23/17 0634  BP: (!) 180/74  Pulse: (!) 102  Resp: 18  Temp: 36.8 C  SpO2: 96%    Last Pain:  Vitals:   03/23/17 0554  PainSc: 3       Patients Stated Pain Goal: 3 (53/74/82 7078)  Complications: No apparent anesthesia complications

## 2017-03-23 NOTE — Anesthesia Procedure Notes (Signed)
Procedure Name: Intubation Date/Time: 03/23/2017 7:39 AM Performed by: Everlean Cherry A Pre-anesthesia Checklist: Patient identified, Emergency Drugs available, Suction available and Patient being monitored Patient Re-evaluated:Patient Re-evaluated prior to induction Oxygen Delivery Method: Circle system utilized Preoxygenation: Pre-oxygenation with 100% oxygen Induction Type: IV induction Ventilation: Mask ventilation without difficulty and Oral airway inserted - appropriate to patient size Laryngoscope Size: Sabra Heck and 2 Grade View: Grade III Tube type: Oral Tube size: 7.5 mm Number of attempts: 1 Airway Equipment and Method: Stylet Placement Confirmation: ETT inserted through vocal cords under direct vision,  positive ETCO2 and breath sounds checked- equal and bilateral Secured at: 22 cm Tube secured with: Tape Dental Injury: Teeth and Oropharynx as per pre-operative assessment

## 2017-03-23 NOTE — Op Note (Signed)
Brief history: The patient is a 66 year old white female who has complained of neck and left arm pain consistent with a cervical radiculopathy. She failed medical management and was worked up with a cervical MRI. This demonstrated multilevel spondylosis, herniated disc, stenosis most prominent and consistent with her symptoms at C3-4, C4-5 and C5-6. I discussed the various treatment options including surgery. She has weighed the risks, benefits, and alternatives to surgery and decided proceed with a C3-4, C4-5 and C5-6 anterior cervical discectomy, fusion, and plating.  Preoperative diagnosis: C3-4, C4-5, and C5-6 herniated disc, spondylosis, stenosis, cervical radiculopathy, cervicalgia  Postoperative diagnosis: The same  Procedure: C3-4, C4-5 and C5-6 Anterior cervical discectomy/decompression; C3-4, C4-5 and C5-6 interbody arthrodesis with local morcellized autograft bone and Kinnex bone graft extender; insertion of interbody prosthesis at C3-4, C4-5 and C5-6 (Zimmer peek interbody prosthesis); anterior cervical plating from C3-C6 with globus titanium plate  Surgeon: Dr. Earle Gell  Asst.: Dr. Ellene Route  Anesthesia: Gen. endotracheal  Estimated blood loss: 125 mL  Drains: None  Complications: None  Description of procedure: The patient was brought to the operating room by the anesthesia team. General endotracheal anesthesia was induced. A roll was placed under the patient's shoulders to keep the neck in the neutral position. The patient's anterior cervical region was then prepared with Betadine scrub and Betadine solution. Sterile drapes were applied.  The area to be incised was then injected with Marcaine with epinephrine solution. I then used a scalpel to make a transverse incision in the patient's left anterior neck. I used the Metzenbaum scissors to divide the platysmal muscle and then to dissect medial to the sternocleidomastoid muscle, jugular vein, and carotid artery. I carefully  dissected down towards the anterior cervical spine identifying the esophagus and retracting it medially. Then using Kitner swabs to clear soft tissue from the anterior cervical spine. We then inserted a bent spinal needle into the upper exposed intervertebral disc space. We then obtained intraoperative radiographs confirm our location.  I then used electrocautery to detach the medial border of the longus colli muscle bilaterally from the C3-4, C4-5 and C5-6 intervertebral disc spaces. I then inserted the Caspar self-retaining retractor underneath the longus colli muscle bilaterally to provide exposure.  We then incised the intervertebral disc at C3-4. We then performed a partial intervertebral discectomy with a pituitary forceps and the Karlin curettes. I then inserted distraction screws into the vertebral bodies at C3-4. We then distracted the interspace. We then used the high-speed drill to decorticate the vertebral endplates at B7-6, to drill away the remainder of the intervertebral disc, to drill away some posterior spondylosis, and to thin out the posterior longitudinal ligament. I then incised ligament with the arachnoid knife. We then removed the ligament with a Kerrison punches undercutting the vertebral endplates and decompressing the thecal sac. We then performed foraminotomies about the bilateral C4 nerve roots. This completed the decompression at this level.  We then repeated this procedure in analogous fashion at C4-5 and C5-6 decompressing the thecal sac and the bilateral C5 and C6 nerve roots.  We now turned our to attention to the interbody fusion. We used the trial spacers to determine the appropriate size for the interbody prosthesis. We then pre-filled prosthesis with a combination of local morcellized autograft bone that we obtained during decompression as well as Kinnex bone graft extender. We then inserted the prosthesis into the distracted interspace at C3-4, C4-5 and C5-6. We then  removed the distraction screws. There was a good snug fit  of the prosthesis in the interspace.  Having completed the fusion we now turned attention to the anterior spinal instrumentation. We used the high-speed drill to drill away some anterior spondylosis at the disc spaces so that the plate lay down flat. We selected the appropriate length titanium anterior cervical plate. We laid it along the anterior aspect of the vertebral bodies from C3-C6. We then drilled 12 mm holes at C3, C4, C5 and C6. We then secured the plate to the vertebral bodies by placing two 12 mm self-tapping screws at C3, C4, C5 and C6. We then obtained intraoperative radiograph. The demonstrating good position of the instrumentation. We therefore secured the screws the plate the locking each cam. This completed the instrumentation.  We then obtained hemostasis using bipolar electrocautery. We irrigated the wound out with bacitracin solution. We then removed the retractor. We inspected the esophagus for any damage. There was none apparent. We then reapproximated patient's platysmal muscle with interrupted 3-0 Vicryl suture. We then reapproximated the subcutaneous tissue with interrupted 3-0 Vicryl suture. The skin was reapproximated with Steri-Strips and benzoin. The wound was then covered with bacitracin ointment. A sterile dressing was applied. The drapes were removed. Patient was subsequently extubated by the anesthesia team and transported to the post anesthesia care unit in stable condition. All sponge instrument and needle counts were reportedly correct at the end of this case.

## 2017-03-23 NOTE — Progress Notes (Signed)
Patient ID: [name], female   DOB: 04/26/1951, 66 y.o.   MRN: [id number] Subjective:  The patient is alert and pleasant. She is in no apparent distress. She looks well.  Objective: Vital signs in last 24 hours: Temp:  [97.5 F (36.4 C)-98.2 F (36.8 C)] 97.5 F (36.4 C) (08/16 1130) Pulse Rate:  [72-114] 72 (08/16 1158) Resp:  [17-35] 20 (08/16 1158) BP: (117-180)/(53-74) 117/53 (08/16 1158) SpO2:  [89 %-98 %] 94 % (08/16 1158) Weight:  [94.5 kg (208 lb 6.4 oz)] 94.5 kg (208 lb 6.4 oz) (08/16 0554)  Intake/Output from previous day: No intake/output data recorded. Intake/Output this shift: Total I/O In: 2390 [P.O.:240; I.V.:1400; IV Piggyback:750] Out: 575 [Urine:500; Blood:75]  Physical exam the patient is alert and pleasant. Her dressing is clean and dry. There is no hematoma or shift. Her strength is normal in all 4 extremities.  Lab Results:  Recent Labs  03/21/17 1127  WBC 7.9  HGB 9.7*  HCT 32.3*  PLT 283   BMET  Recent Labs  03/21/17 1127  NA 135  K 4.0  CL 100*  CO2 26  GLUCOSE 368*  BUN 7  CREATININE 0.99  CALCIUM 8.8*    Studies/Results: Dg Cervical Spine 2-3 Views  Result Date: 03/23/2017 CLINICAL DATA:  Cervical fusion EXAM: CERVICAL SPINE - 2-3 VIEW COMPARISON:  03/05/2017 FINDINGS: Initial lateral view of the cervical spine reveals an instrument in the anterior aspect of the C3-4 disc space. Subsequent fusion is noted at C3-4, C4-5 and C5-6. Anterior fixation is noted. IMPRESSION: Cervical fusion from C3-C6. Electronically Signed   By: Inez Catalina M.D.   On: 03/23/2017 10:49    Assessment/Plan: The patient is doing well. I spoke with her husband.  LOS: 0 days     Raneem Mendolia D 03/23/2017, 1:38 PM

## 2017-03-24 LAB — GLUCOSE, CAPILLARY: Glucose-Capillary: 259 mg/dL — ABNORMAL HIGH (ref 65–99)

## 2017-03-24 MED ORDER — HYDROCODONE-ACETAMINOPHEN 5-325 MG PO TABS: 1.0000 | ORAL_TABLET | Freq: Four times a day (QID) | ORAL | 0 refills | Status: DC | PRN

## 2017-03-24 MED ORDER — CYCLOBENZAPRINE HCL 10 MG PO TABS: 10.0000 mg | ORAL_TABLET | Freq: Three times a day (TID) | ORAL | 0 refills | Status: DC | PRN

## 2017-03-24 NOTE — Progress Notes (Signed)
Patient alert and oriented, mae's well, voiding adequate amount of urine, swallowing without difficulty, no c/o pain at time of discharge. Patient discharged home with family. Script and discharged instructions given to patient. Patient and family stated understanding of instructions given. Patient has an appointment with Dr. Jenkins   

## 2017-03-24 NOTE — Discharge Summary (Signed)
Physician Discharge Summary  Patient ID: [name] MRN: [id number] DOB/AGE: 1951-03-23 66 y.o.  Admit date: 03/23/2017 Discharge date: 03/24/2017  Admission Diagnoses:  Cervical spondylosis with radiculiopathy  Discharge Diagnoses:  Same Active Problems:   Cervical spondylosis with radiculopathy   Discharged Condition: Stable  Hospital Course:  [name] is a 66 y.o. female who was admitted for the below procedure. There were no post operative complications. At time of discharge, pain was well controlled, ambulating with Pt/OT, tolerating po, voiding normal. Ready for discharge.  Treatments: Surgery C3-4, C4-5 and C5-6 Anterior cervical discectomy/decompression; C3-4, C4-5 and C5-6 interbody arthrodesis with local morcellized autograft bone and Kinnex bone graft extender; insertion of interbody prosthesis at C3-4, C4-5 and C5-6 (Zimmer peek interbody prosthesis); anterior cervical plating from C3-C6 with globus titanium plate  Discharge Exam: Blood pressure (!) 158/78, pulse 97, temperature 99.2 F (37.3 C), resp. rate 18, height 5\' 4"  (1.626 m), weight 94.5 kg (208 lb 6.4 oz), SpO2 97 %. Awake, alert, oriented Speech fluent, appropriate CN grossly intact 5/5 BUE/BLE Wound c/d/i  Disposition: 01-Home or Self Care  Discharge Instructions    Call MD for:  difficulty breathing, headache or visual disturbances    Complete by:  As directed    Call MD for:  persistant dizziness or light-headedness    Complete by:  As directed    Call MD for:  redness, tenderness, or signs of infection (pain, swelling, redness, odor or green/yellow discharge around incision site)    Complete by:  As directed    Call MD for:  severe uncontrolled pain    Complete by:  As directed    Call MD for:  temperature >100.4    Complete by:  As directed    Diet general    Complete by:  As directed    Driving Restrictions    Complete by:  As directed    Do not drive until given clearance.   Increase activity slowly    Complete by:  As directed    Lifting restrictions    Complete by:  As directed    Do not lift anything >10lbs. Avoid bending and twisting in awkward positions. Avoid bending at the back.     Allergies as of 03/24/2017      Reactions   Codeine Nausea And Vomiting   Ambien [zolpidem Tartrate] Other (See Comments)   Up sleep walking and eating   Crestor [rosuvastatin] Other (See Comments)   Muscle weakness, cramps and aching all over body   Lipitor [atorvastatin] Itching, Other (See Comments)   Muscle weakness, cramps and aches all over body   Morphine And Related Other (See Comments)   Flushing and feeling hot Tolerates with Diphenhydramine   Penicillins Rash, Other (See Comments)   Caused Headaches also Has patient had a PCN reaction causing immediate rash, facial/tongue/throat swelling, SOB or lightheadedness with hypotension: No Has patient had a PCN reaction causing severe rash involving mucus membranes or skin necrosis: No Has patient had a PCN reaction that required hospitalization No Has patient had a PCN reaction occurring within the last 10 years: No If all of the above answers are "NO", then may proceed with Cephalosporin use.   Statins Other (See Comments)   Muscle weakness, cramps and aching all over body   Dilaudid [hydromorphone Hcl] Other (See Comments)   Hallucinations/ argumentative, goes beserk   Amitriptyline Other (See Comments)   Loopy feeling   Ceftin [cefuroxime Axetil] Other (See Comments)   Stomach  pain    Lovaza [omega-3-acid Ethyl Esters] Other (See Comments)   Stomach pain    Lunesta [eszopiclone] Other (See Comments)   Causes dizziness   Lyrica [pregabalin] Nausea Only   Metformin And Related Nausea Only   Pt denies this intolerance      Medication List    STOP taking these medications   baclofen 10 MG tablet Commonly known as:  LIORESAL   oxyCODONE 5 MG immediate release tablet Commonly known as:  Oxy  IR/ROXICODONE     TAKE these medications   acetaminophen 500 MG tablet Commonly known as:  TYLENOL Take 1,000 mg by mouth every 8 (eight) hours as needed for mild pain or headache.   albuterol 108 (90 Base) MCG/ACT inhaler Commonly known as:  PROVENTIL HFA;VENTOLIN HFA Inhale 2 puffs into the lungs every 4 (four) hours as needed for wheezing or shortness of breath.   amLODipine 5 MG tablet Commonly known as:  NORVASC Take 5 mg by mouth daily.   aspirin 81 MG chewable tablet Chew 81 mg by mouth daily.   benzonatate 100 MG capsule Commonly known as:  TESSALON Take 1 capsule (100 mg total) by mouth every 8 (eight) hours. What changed:  when to take this  reasons to take this   cholecalciferol 1000 units tablet Commonly known as:  VITAMIN D Take 1,000 Units by mouth daily.   clobetasol 0.05 % external solution Commonly known as:  TEMOVATE Apply 1 application topically See admin instructions. Apply twice daily to scalp and ear as needed for itching/rash (do not apply to face/groin/underarms)   cyclobenzaprine 10 MG tablet Commonly known as:  FLEXERIL Take 1 tablet (10 mg total) by mouth 3 (three) times daily as needed for muscle spasms.   diazepam 2 MG tablet Commonly known as:  VALIUM Take 0.5 tablets (1 mg total) by mouth every 12 (twelve) hours as needed for anxiety or muscle spasms.   DULoxetine 60 MG capsule Commonly known as:  CYMBALTA Take 60 mg by mouth daily.   fluticasone 50 MCG/ACT nasal spray Commonly known as:  FLONASE Place 2 sprays into both nostrils daily as needed for allergies or rhinitis.   furosemide 20 MG tablet Commonly known as:  LASIX Take 20 mg by mouth daily as needed for fluid or edema.   HYDROcodone-acetaminophen 5-325 MG tablet Commonly known as:  NORCO/VICODIN Take 1-2 tablets by mouth every 6 (six) hours as needed for moderate pain. What changed:  how much to take  when to take this   insulin aspart protamine- aspart (70-30)  100 UNIT/ML injection Commonly known as:  NOVOLOG MIX 70/30 Inject 0.3 mLs (30 Units total) into the skin 2 (two) times daily with a meal. What changed:  how much to take   levothyroxine 137 MCG tablet Commonly known as:  SYNTHROID, LEVOTHROID Take 137 mcg by mouth daily before breakfast.   metoprolol tartrate 25 MG tablet Commonly known as:  LOPRESSOR Take 0.5 tablets (12.5 mg total) by mouth daily.   pantoprazole 40 MG tablet Commonly known as:  PROTONIX Take 40 mg by mouth 2 (two) times daily.   promethazine 25 MG tablet Commonly known as:  PHENERGAN Take 25 mg by mouth every 8 (eight) hours as needed for nausea or vomiting.   temazepam 30 MG capsule Commonly known as:  RESTORIL Take 30 mg by mouth at bedtime.   valsartan 320 MG tablet Commonly known as:  DIOVAN Take 320 mg by mouth daily.   vitamin B-12 1000 MCG  tablet Commonly known as:  CYANOCOBALAMIN Take 1,000 mcg by mouth daily.   Vitamin D (Ergocalciferol) 50000 units Caps capsule Commonly known as:  DRISDOL TAKE ONE CAPSULE BY MOUTH EVERY OTHER WEEK      Follow-up Information    Newman Pies, MD Follow up.   Specialty:  Neurosurgery Contact information: [address] [place] [address] [phone]           Signed: Traci Sermon 03/24/2017, 10:31 AM

## 2017-03-27 ENCOUNTER — Encounter (HOSPITAL_COMMUNITY): Payer: Self-pay | Admitting: Neurosurgery

## 2017-03-27 LAB — TYPE AND SCREEN
ABO/RH(D): O POS
Antibody Screen: NEGATIVE
UNIT DIVISION: 0
Unit division: 0

## 2017-03-27 LAB — BPAM RBC
BLOOD PRODUCT EXPIRATION DATE: 201809082359
Blood Product Expiration Date: 201809082359
UNIT TYPE AND RH: 5100
Unit Type and Rh: 5100

## 2017-03-27 LAB — GLUCOSE, CAPILLARY: Glucose-Capillary: 549 mg/dL (ref 65–99)

## 2017-04-18 DIAGNOSIS — M4722 Other spondylosis with radiculopathy, cervical region: Secondary | ICD-10-CM | POA: Diagnosis not present

## 2017-04-18 DIAGNOSIS — M4712 Other spondylosis with myelopathy, cervical region: Secondary | ICD-10-CM | POA: Diagnosis not present

## 2017-04-20 DIAGNOSIS — Z4889 Encounter for other specified surgical aftercare: Secondary | ICD-10-CM | POA: Diagnosis not present

## 2017-05-05 DIAGNOSIS — M7989 Other specified soft tissue disorders: Secondary | ICD-10-CM | POA: Diagnosis not present

## 2017-05-05 DIAGNOSIS — M7732 Calcaneal spur, left foot: Secondary | ICD-10-CM | POA: Diagnosis not present

## 2017-05-05 DIAGNOSIS — M25572 Pain in left ankle and joints of left foot: Secondary | ICD-10-CM | POA: Diagnosis not present

## 2017-05-15 DIAGNOSIS — F331 Major depressive disorder, recurrent, moderate: Secondary | ICD-10-CM | POA: Diagnosis not present

## 2017-05-15 DIAGNOSIS — K219 Gastro-esophageal reflux disease without esophagitis: Secondary | ICD-10-CM | POA: Diagnosis not present

## 2017-05-15 DIAGNOSIS — R197 Diarrhea, unspecified: Secondary | ICD-10-CM | POA: Diagnosis not present

## 2017-05-15 DIAGNOSIS — M797 Fibromyalgia: Secondary | ICD-10-CM | POA: Diagnosis not present

## 2017-05-15 DIAGNOSIS — M791 Myalgia, unspecified site: Secondary | ICD-10-CM | POA: Diagnosis not present

## 2017-05-15 DIAGNOSIS — Z0000 Encounter for general adult medical examination without abnormal findings: Secondary | ICD-10-CM | POA: Diagnosis not present

## 2017-05-15 DIAGNOSIS — D509 Iron deficiency anemia, unspecified: Secondary | ICD-10-CM | POA: Diagnosis not present

## 2017-05-15 DIAGNOSIS — I10 Essential (primary) hypertension: Secondary | ICD-10-CM | POA: Diagnosis not present

## 2017-05-15 DIAGNOSIS — Z79899 Other long term (current) drug therapy: Secondary | ICD-10-CM | POA: Diagnosis not present

## 2017-05-15 DIAGNOSIS — E039 Hypothyroidism, unspecified: Secondary | ICD-10-CM | POA: Diagnosis not present

## 2017-05-17 IMAGING — MR MR LUMBAR SPINE W/O CM
4 of 5 series · 24 of 48 positions shown · non-contrast
Comparison: CT abdomen and pelvis August 08, 2016 and MRI of the
lumbar spine April 26, 2015

CLINICAL DATA: Fell 5 years ago, re-injured back. Status post
spinal surgery 1990s. Severe back pain beginning July 2016.

EXAM:
MRI LUMBAR SPINE WITHOUT CONTRAST
TECHNIQUE: Multiplanar, multisequence MR imaging of the lumbar spine was
performed. No intravenous contrast was administered.

[Series 4: T2 · sagittal · 4.0mm · 0.55mm/px · 6 of 15 slices shown (1 of 2)]
[im 1/15]
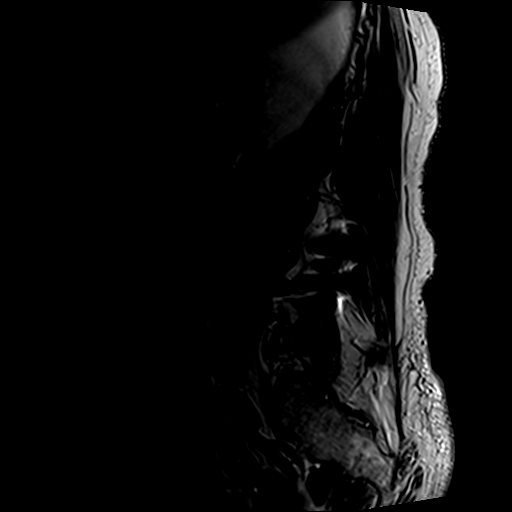
[im 3/15]
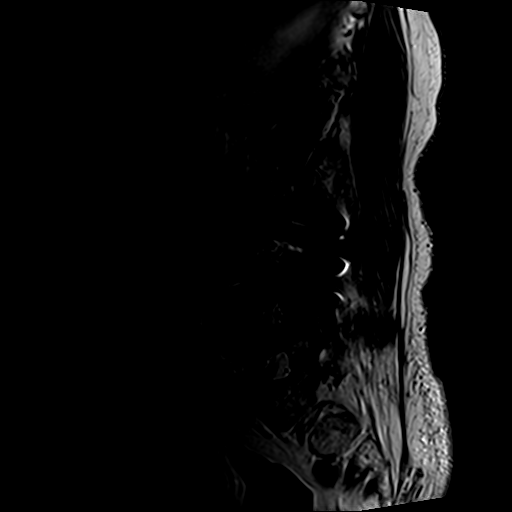
[im 6/15]
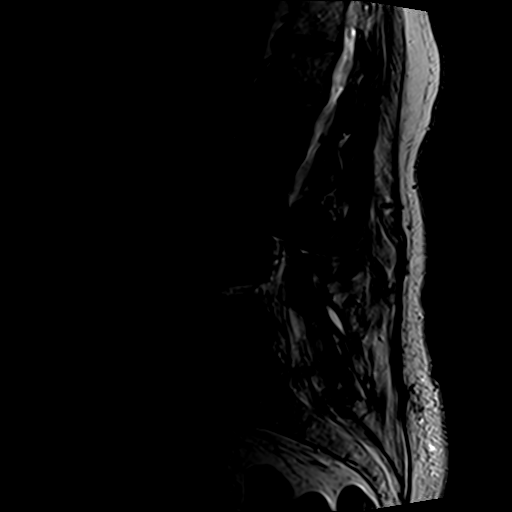
[im 9/15]
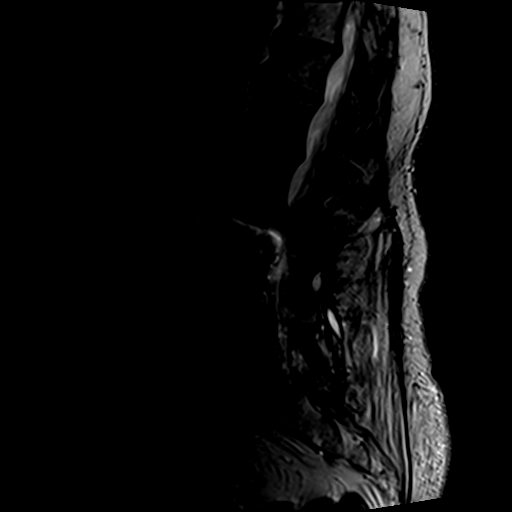
[im 12/15]
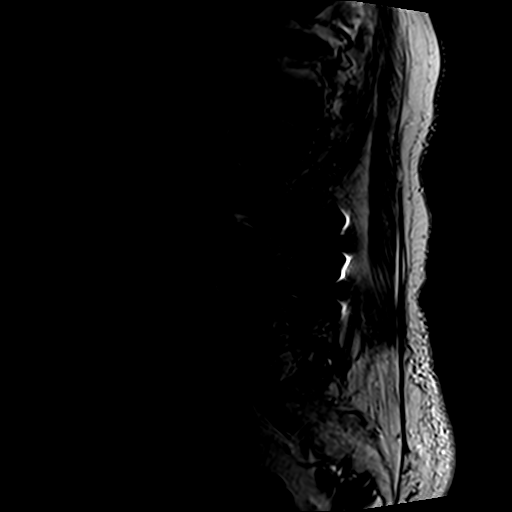
[im 15/15]
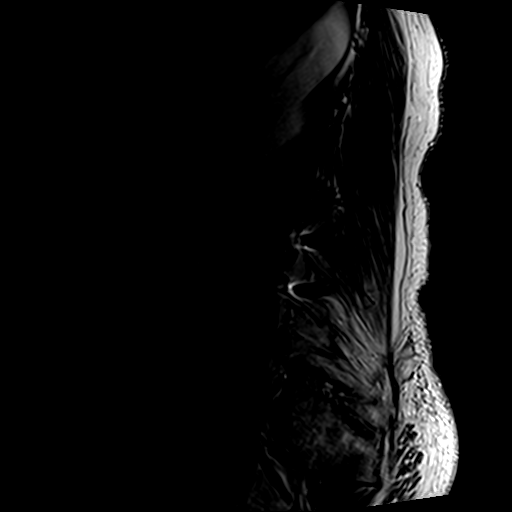

[Series 5: T1 · sagittal · 4.0mm · 0.55mm/px · 7 of 15 slices shown (1 of 2)]
[im 1/15]
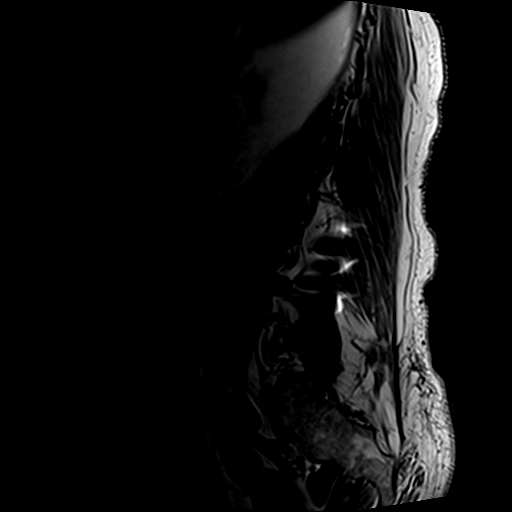
[im 3/15]
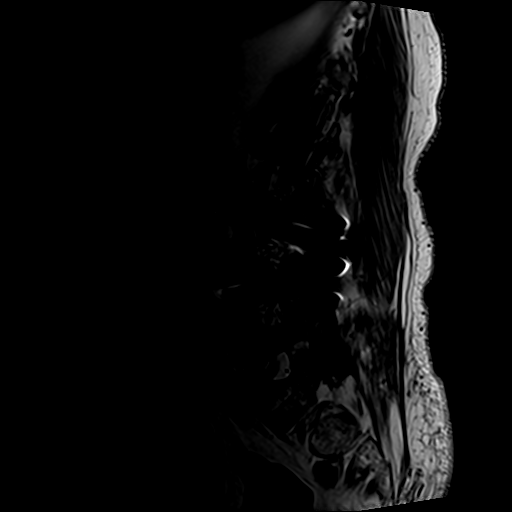
[im 5/15]
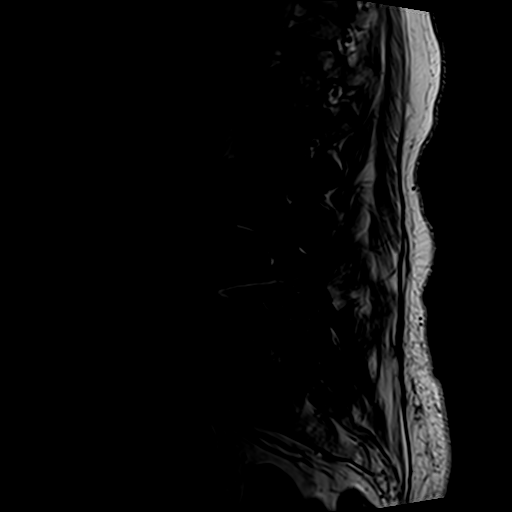
[im 8/15]
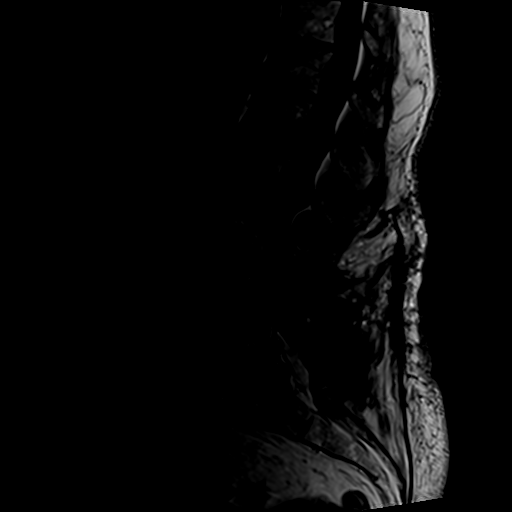
[im 10/15]
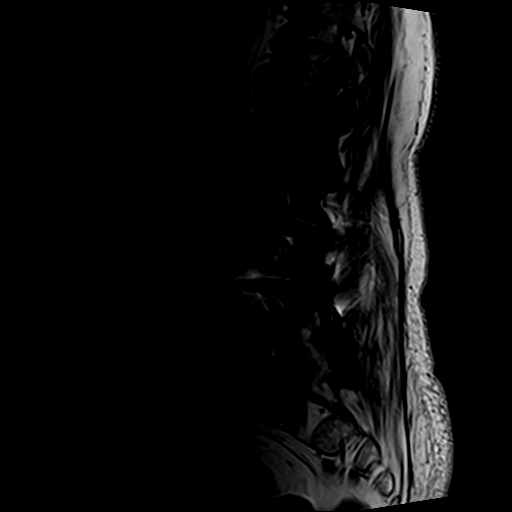
[im 12/15]
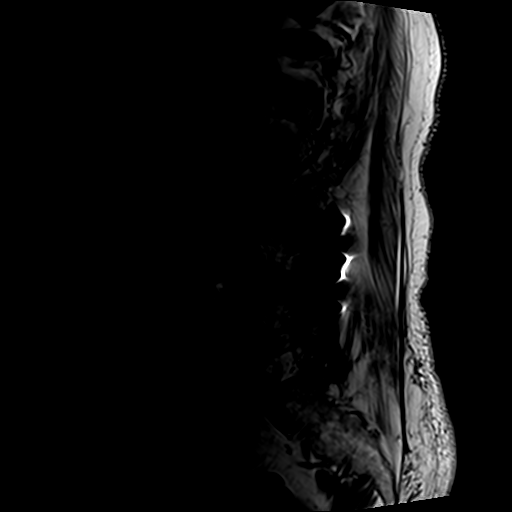
[im 15/15]
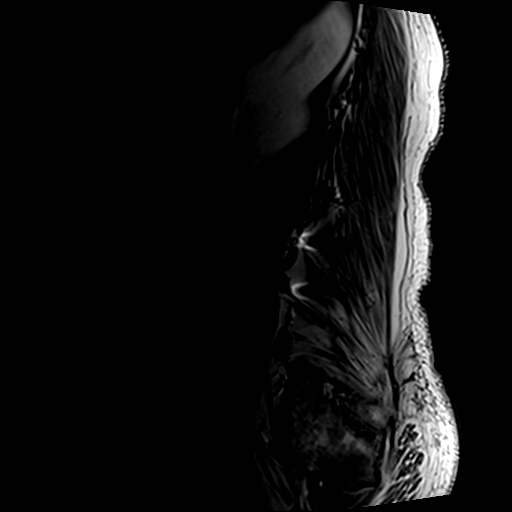

[Series 7: T2 · axial · 4.0mm · 0.70mm/px · z∈[-55,+120]mm · 8 of 32 slices shown (2 of 2)]
[im 1/32]
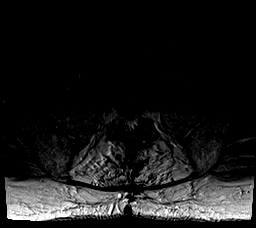
[im 5/32]
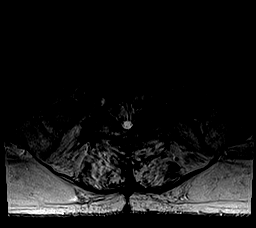
[im 10/32]
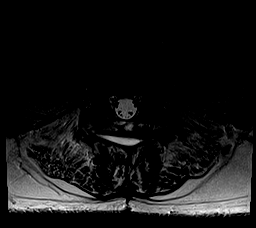
[im 15/32]
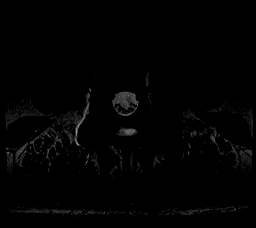
[im 17/32]
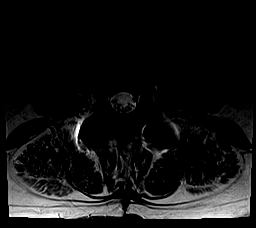
[im 22/32]
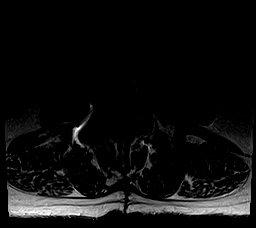
[im 27/32]
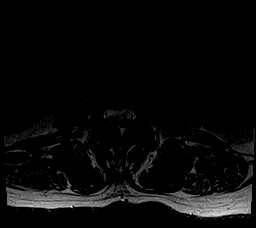
[im 32/32]
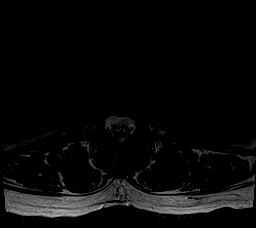

[Series 8: T1 · axial · 4.0mm · 0.35mm/px · z∈[-34,+95]mm · 3 of 32 slices shown (2 of 2)]
[im 5/32]
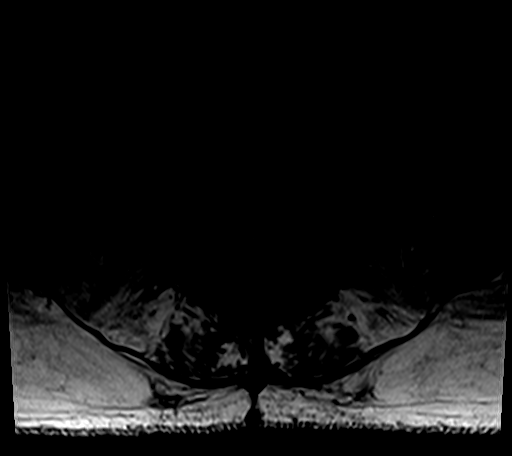
[im 17/32]
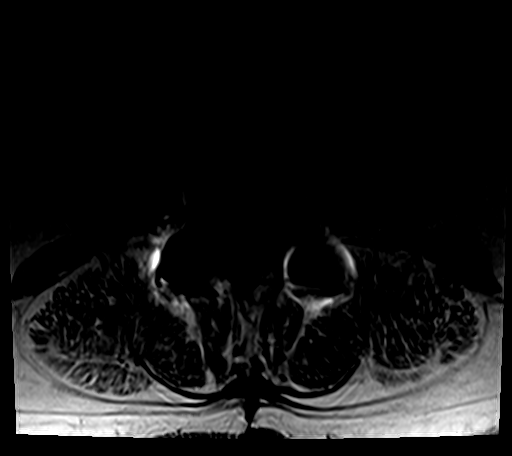
[im 27/32]
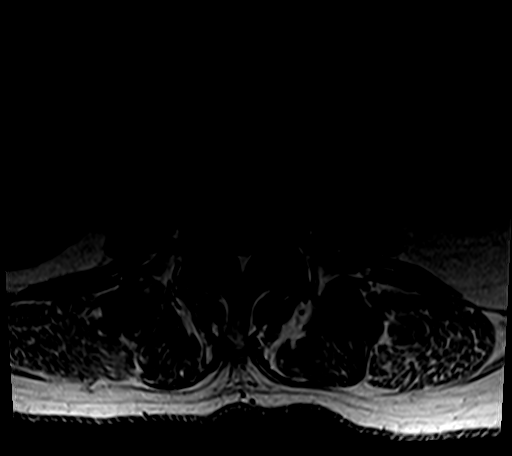

[24 of 48 positions shown; findings below may reference images not displayed]

FINDINGS: SEGMENTATION: For the purposes of this report, the last well-formed
intervertebral disc will be described as L5-S1.

ALIGNMENT: Maintenance of the lumbar lordosis. Minimal grade 1 L2-3
retrolisthesis.

VERTEBRAE:Vertebral bodies are intact. L3-4 through L5-S1 PLIF,
arthrodesis better demonstrated on prior CT. L3-4 pedicle screws
result in susceptibility artifact. Severe L2-3 disc height loss.
Decreased T2 signal within the disc compatible with desiccation.
Moderate acute on chronic discogenic endplate changes L2-3. No
suspicious bone marrow signal. Mild old T12 compression fracture.
Old scattered Schmorl's nodes.

CONUS MEDULLARIS: Conus medullaris terminates at T12-L1 and
demonstrates normal morphology and signal characteristics. Cauda
equina is normal.

PARASPINAL AND SOFT TISSUES: Small Old seromas measuring up to 3.3 x
0.6 cm and may paraspinal surgical beds. Severe paraspinal muscle
atrophy at and below the level surgical intervention.

DISC LEVELS:

L1-2: Small broad-based disc bulge. Mild facet arthropathy and
ligamentum flavum redundancy without canal stenosis or neural
foraminal narrowing.

L2-3: Retrolisthesis. Moderate broad-based disc bulge, severe facet
arthropathy and ligamentum flavum redundancy. Similar moderate to
severe canal stenosis. Moderate neural foraminal narrowing.

L3-4 and L4-5: PLIF and posterior decompression without canal
stenosis or neural foraminal narrowing.

L5-S1: Interbody fusion. Severe facet arthropathy without canal
stenosis or neural foraminal narrowing.
IMPRESSION: L3-4 through L5-S1 fusion, with similar L2-3 adjacent segment
disease including grade 1 L2-3 retrolisthesis.

Similar moderate to severe canal stenosis L2-3 and moderate L2-3
neural foraminal narrowing.

## 2017-05-31 DIAGNOSIS — Z86010 Personal history of colonic polyps: Secondary | ICD-10-CM | POA: Diagnosis not present

## 2017-05-31 DIAGNOSIS — E1165 Type 2 diabetes mellitus with hyperglycemia: Secondary | ICD-10-CM | POA: Diagnosis not present

## 2017-05-31 DIAGNOSIS — D509 Iron deficiency anemia, unspecified: Secondary | ICD-10-CM | POA: Diagnosis not present

## 2017-05-31 DIAGNOSIS — Z6837 Body mass index (BMI) 37.0-37.9, adult: Secondary | ICD-10-CM | POA: Diagnosis not present

## 2017-05-31 DIAGNOSIS — Z794 Long term (current) use of insulin: Secondary | ICD-10-CM | POA: Diagnosis not present

## 2017-06-05 DIAGNOSIS — E039 Hypothyroidism, unspecified: Secondary | ICD-10-CM | POA: Diagnosis not present

## 2017-06-05 DIAGNOSIS — E1165 Type 2 diabetes mellitus with hyperglycemia: Secondary | ICD-10-CM | POA: Diagnosis not present

## 2017-06-05 DIAGNOSIS — Z794 Long term (current) use of insulin: Secondary | ICD-10-CM | POA: Diagnosis not present

## 2017-06-22 ENCOUNTER — Other Ambulatory Visit: Payer: Self-pay | Admitting: *Deleted

## 2017-06-22 DIAGNOSIS — E1165 Type 2 diabetes mellitus with hyperglycemia: Secondary | ICD-10-CM | POA: Diagnosis not present

## 2017-06-22 DIAGNOSIS — R55 Syncope and collapse: Secondary | ICD-10-CM | POA: Diagnosis not present

## 2017-06-22 DIAGNOSIS — F324 Major depressive disorder, single episode, in partial remission: Secondary | ICD-10-CM | POA: Diagnosis not present

## 2017-06-22 DIAGNOSIS — I499 Cardiac arrhythmia, unspecified: Secondary | ICD-10-CM | POA: Diagnosis not present

## 2017-06-22 DIAGNOSIS — I491 Atrial premature depolarization: Secondary | ICD-10-CM | POA: Diagnosis not present

## 2017-06-22 DIAGNOSIS — D509 Iron deficiency anemia, unspecified: Secondary | ICD-10-CM | POA: Diagnosis not present

## 2017-06-22 NOTE — Telephone Encounter (Signed)
Referral notes sent to scheduling Marlis Edelson, CMA

## 2017-07-07 DIAGNOSIS — K5730 Diverticulosis of large intestine without perforation or abscess without bleeding: Secondary | ICD-10-CM | POA: Diagnosis not present

## 2017-07-07 DIAGNOSIS — D509 Iron deficiency anemia, unspecified: Secondary | ICD-10-CM | POA: Diagnosis not present

## 2017-07-07 DIAGNOSIS — D126 Benign neoplasm of colon, unspecified: Secondary | ICD-10-CM | POA: Diagnosis not present

## 2017-07-07 DIAGNOSIS — Z86010 Personal history of colonic polyps: Secondary | ICD-10-CM | POA: Diagnosis not present

## 2017-07-11 DIAGNOSIS — D126 Benign neoplasm of colon, unspecified: Secondary | ICD-10-CM | POA: Diagnosis not present

## 2017-07-27 DIAGNOSIS — E1165 Type 2 diabetes mellitus with hyperglycemia: Secondary | ICD-10-CM | POA: Diagnosis not present

## 2017-07-27 DIAGNOSIS — E039 Hypothyroidism, unspecified: Secondary | ICD-10-CM | POA: Diagnosis not present

## 2017-07-27 DIAGNOSIS — Z794 Long term (current) use of insulin: Secondary | ICD-10-CM | POA: Diagnosis not present

## 2017-07-27 DIAGNOSIS — Z23 Encounter for immunization: Secondary | ICD-10-CM | POA: Diagnosis not present

## 2017-08-03 DIAGNOSIS — E1165 Type 2 diabetes mellitus with hyperglycemia: Secondary | ICD-10-CM | POA: Diagnosis not present

## 2017-08-03 DIAGNOSIS — J029 Acute pharyngitis, unspecified: Secondary | ICD-10-CM | POA: Diagnosis not present

## 2017-08-03 DIAGNOSIS — R6889 Other general symptoms and signs: Secondary | ICD-10-CM | POA: Diagnosis not present

## 2017-08-03 DIAGNOSIS — Z794 Long term (current) use of insulin: Secondary | ICD-10-CM | POA: Diagnosis not present

## 2017-08-03 DIAGNOSIS — I10 Essential (primary) hypertension: Secondary | ICD-10-CM | POA: Diagnosis not present

## 2017-08-03 DIAGNOSIS — Z7984 Long term (current) use of oral hypoglycemic drugs: Secondary | ICD-10-CM | POA: Diagnosis not present

## 2017-08-14 DIAGNOSIS — M5020 Other cervical disc displacement, unspecified cervical region: Secondary | ICD-10-CM | POA: Diagnosis not present

## 2017-08-14 DIAGNOSIS — M546 Pain in thoracic spine: Secondary | ICD-10-CM | POA: Diagnosis not present

## 2017-08-14 DIAGNOSIS — S22080S Wedge compression fracture of T11-T12 vertebra, sequela: Secondary | ICD-10-CM | POA: Diagnosis not present

## 2017-08-14 DIAGNOSIS — I10 Essential (primary) hypertension: Secondary | ICD-10-CM | POA: Diagnosis not present

## 2017-08-14 DIAGNOSIS — Z6836 Body mass index (BMI) 36.0-36.9, adult: Secondary | ICD-10-CM | POA: Diagnosis not present

## 2017-08-22 DIAGNOSIS — E039 Hypothyroidism, unspecified: Secondary | ICD-10-CM | POA: Diagnosis not present

## 2017-08-22 DIAGNOSIS — N289 Disorder of kidney and ureter, unspecified: Secondary | ICD-10-CM | POA: Diagnosis not present

## 2017-08-22 DIAGNOSIS — D509 Iron deficiency anemia, unspecified: Secondary | ICD-10-CM | POA: Diagnosis not present

## 2017-08-22 DIAGNOSIS — K219 Gastro-esophageal reflux disease without esophagitis: Secondary | ICD-10-CM | POA: Diagnosis not present

## 2017-08-22 DIAGNOSIS — M48062 Spinal stenosis, lumbar region with neurogenic claudication: Secondary | ICD-10-CM | POA: Diagnosis not present

## 2017-08-22 DIAGNOSIS — I10 Essential (primary) hypertension: Secondary | ICD-10-CM | POA: Diagnosis not present

## 2017-08-22 DIAGNOSIS — F325 Major depressive disorder, single episode, in full remission: Secondary | ICD-10-CM | POA: Diagnosis not present

## 2017-08-22 DIAGNOSIS — Z23 Encounter for immunization: Secondary | ICD-10-CM | POA: Diagnosis not present

## 2017-08-22 DIAGNOSIS — K140 Glossitis: Secondary | ICD-10-CM | POA: Diagnosis not present

## 2017-08-22 DIAGNOSIS — M4712 Other spondylosis with myelopathy, cervical region: Secondary | ICD-10-CM | POA: Diagnosis not present

## 2017-08-22 DIAGNOSIS — Z6836 Body mass index (BMI) 36.0-36.9, adult: Secondary | ICD-10-CM | POA: Diagnosis not present

## 2017-09-04 ENCOUNTER — Ambulatory Visit (INDEPENDENT_AMBULATORY_CARE_PROVIDER_SITE_OTHER): Payer: Medicare Other | Admitting: Cardiology

## 2017-09-04 ENCOUNTER — Encounter: Payer: Self-pay | Admitting: Cardiology

## 2017-09-04 ENCOUNTER — Encounter (INDEPENDENT_AMBULATORY_CARE_PROVIDER_SITE_OTHER): Payer: Self-pay

## 2017-09-04 VITALS — BP 132/70 | HR 97 | Ht 63.0 in | Wt 203.6 lb

## 2017-09-04 DIAGNOSIS — R42 Dizziness and giddiness: Secondary | ICD-10-CM | POA: Diagnosis not present

## 2017-09-04 DIAGNOSIS — R55 Syncope and collapse: Secondary | ICD-10-CM

## 2017-09-04 DIAGNOSIS — I491 Atrial premature depolarization: Secondary | ICD-10-CM | POA: Diagnosis not present

## 2017-09-04 DIAGNOSIS — I10 Essential (primary) hypertension: Secondary | ICD-10-CM | POA: Diagnosis not present

## 2017-09-04 NOTE — Patient Instructions (Addendum)
Medication Instructions:  Your physician recommends that you continue on your current medications as directed. Please refer to the Current Medication list given to you today.  Labwork: None ordered   Testing/Procedures: Your physician has requested that you have an echocardiogram. Echocardiography is a painless test that uses sound waves to create images of your heart. It provides your doctor with information about the size and shape of your heart and how well your heart's chambers and valves are working. This procedure takes approximately one hour. There are no restrictions for this procedure.  Your physician has recommended that you wear an event monitor. Event monitors are medical devices that record the heart's electrical activity. Doctors most often [place] these monitors to diagnose arrhythmias. Arrhythmias are problems with the speed or rhythm of the heartbeat. The monitor is a small, portable device. You can wear one while you do your normal daily activities. This is usually used to diagnose what is causing palpitations/syncope (passing out).   Follow-Up: Your physician recommends that you schedule a follow-up appointment in: 4 weeks with PA    Any Other Special Instructions Will Be Listed Below (If Applicable). Your physician recommends that you wear compression socks during the day.     If you need a refill on your cardiac medications before your next appointment, please call your pharmacy.

## 2017-09-04 NOTE — Progress Notes (Signed)
Cardiology Office Note    Date:  09/04/2017   ID:  [name], DOB 02-06-1951, MRN [id number]  PCP:  Harlan Stains, MD  Cardiologist:  Fransico Him, MD   Chief Complaint  Patient presents with  . New Patient (Initial Visit)    presyncope and PACs    History of Present Illness:  [name] is a 67 y.o. female who is being seen today for the evaluation of frequent PACs and presyncope at the request of Harlan Stains, MD.  This is a pleasant 67yo female with a history of DM2, hypothyroidism, HTN, depression, GERD, CKD stage 3 and fibromyalgia who recently saw her PCP due to some problems with feeling lightheaded and weak.  One day she fel lightheaded after getting out of the shower and had to lay down for 20 minutes and then went back to the bathroom and saw colors in her vision and was off balance.  Her BP had been running soft so her PCP cut her Olmesartan back.  She was noted to have frequent PACs on that EKG and is now referred for further evaluation.  Over the past 3 weeks she had a URI and has had some problems with dizziness and lightheadedness.  She has had the episodes where she all of a sudden she will get a wave where she gets a rush down her body and then felt very dizzy and had to lay down.  BP at that time was 163/46mmHg.  She has not had any times when her BP was low.  She has had about 4 episodes of this in the past 3 weeks.  She has been under a lot of stress as she has been the primary caregiver for her mom who died a few weeks ago and has had do deal with a lot of issues.     She denies any chest pain or pressure, PND, orthopnea or palpitations. She has had some SOB with her URI but that has improved.  She does have some DOE with climbing stairs but thinks it is related to her residual URI.  She occasionally will had some LE edema which resolves with elevation of legs or PRN Lasix. She is compliant with her meds and is tolerating meds with no SE.      Past Medical  History:  Diagnosis Date  . Anemia    iron deficiency  . Arthritis   . Blood transfusion   . Cervical spondylosis with myelopathy and radiculopathy   . Depression   . Diabetes mellitus    Type II  . Diastolic dysfunction   . Diverticulitis   . DJD (degenerative joint disease)   . DVT (deep venous thrombosis) (HCC)    times 2 lower leg  . Endometriosis   . Family history of adverse reaction to anesthesia    mom and dad PONV  . Fibromyalgia   . GERD (gastroesophageal reflux disease)    occ  . Hiatal hernia   . History of kidney stones   . Hyperlipidemia   . Hypertension   . Hypothyroidism   . Insomnia   . Mild aortic insufficiency    by echo 04/2013  . Osteoarthritis    back and knee  . Ovarian cyst   . PONV (postoperative nausea and vomiting)   . RLS (restless legs syndrome)   . Thoracic compression fracture ([place])   . Uterine fibroid   . UTI (lower urinary tract infection)   . Villous adenoma of right colon  04/08/2016  . Vitamin D deficiency disease     Past Surgical History:  Procedure Laterality Date  . ANTERIOR CERVICAL DECOMP/DISCECTOMY FUSION N/A 03/23/2017   Procedure: ANTERIOR CERVICAL DECOMPRESSION/DISCECTOMY FUSION, INTERBODY PROSTHESIS,PLATE CERVICAL THREE- CERVICAL FOUR, CERVICAL FOUR- CERVICAL FIVE, CERVICAL FIVE- CERVICAL SIX;  Surgeon: Newman Pies, MD;  Location: [facility];  Service: Neurosurgery;  Laterality: N/A;  . APPENDECTOMY    . BACK SURGERY  ,2005   April  2013 - spinal fusion[facility]  . BREAST SURGERY     breast reduction  . CARDIAC CATHETERIZATION  04/2013   normal coronary arteries and normal LVF  . CARPAL TUNNEL RELEASE  06/05/2012   Procedure: CARPAL TUNNEL RELEASE;  Surgeon: Cammie Sickle., MD;  Location: [facility];  Service: Orthopedics;  Laterality: Left;  . CESAREAN SECTION     x 2  . CHOLECYSTECTOMY    . COLECTOMY  04/08/2016  . DILATION AND CURETTAGE OF UTERUS    . DORSAL COMPARTMENT RELEASE  06/05/2012    Procedure: RELEASE DORSAL COMPARTMENT (DEQUERVAIN);  Surgeon: Cammie Sickle., MD;  Location: [facility];  Service: Orthopedics;  Laterality: Left;  Excision of mixoid cyst also  . EYE SURGERY     cataracts bilateral  . JOINT REPLACEMENT     Bilateral knee  . KNEE ARTHROPLASTY  09   lft partial  . KNEE ARTHROPLASTY     rt  . LAPAROSCOPIC PARTIAL COLECTOMY N/A 04/08/2016   Procedure: LAPAROSCOPIC ASSISTED ASCENDING COLECTOMY POSSIBLE OPEN COLECTOMY;  Surgeon: Fanny Skates, MD;  Location: [facility];  Service: General;  Laterality: N/A;  . orthopedic surgeries     multiple  . SHOULDER OPEN ROTATOR CUFF REPAIR     rt and lft  . TUBAL LIGATION     btsp    Current Medications: Current Meds  Medication Sig  . acetaminophen (TYLENOL) 500 MG tablet Take 1,000 mg by mouth every 8 (eight) hours as needed for mild pain or headache.  . albuterol (PROVENTIL HFA;VENTOLIN HFA) 108 (90 Base) MCG/ACT inhaler Inhale 2 puffs into the lungs every 4 (four) hours as needed for wheezing or shortness of breath.  Marland Kitchen amLODipine (NORVASC) 5 MG tablet Take 5 mg by mouth daily.  Marland Kitchen aspirin 81 MG chewable tablet Chew 81 mg by mouth daily.  . benzonatate (TESSALON) 100 MG capsule Take 100 mg by mouth 3 (three) times daily as needed for cough.  . cholecalciferol (VITAMIN D) 1000 units tablet Take 1,000 Units by mouth daily.  . clobetasol (TEMOVATE) 0.05 % external solution Apply 1 application topically See admin instructions. Apply twice daily to scalp and ear as needed for itching/rash (do not apply to face/groin/underarms)  . cyclobenzaprine (FLEXERIL) 10 MG tablet Take 1 tablet (10 mg total) by mouth 3 (three) times daily as needed for muscle spasms.  . DULoxetine (CYMBALTA) 60 MG capsule Take 180 mg by mouth daily.   . fluticasone (FLONASE) 50 MCG/ACT nasal spray Place 2 sprays into both nostrils daily as needed for allergies or rhinitis.  . furosemide (LASIX) 20 MG tablet Take 20 mg by mouth daily  as needed for fluid or edema.   Marland Kitchen HYDROcodone-acetaminophen (NORCO/VICODIN) 5-325 MG tablet Take 1-2 tablets by mouth every 6 (six) hours as needed for moderate pain.  Marland Kitchen JARDIANCE 10 MG TABS tablet Take 10 mg by mouth daily.  Marland Kitchen levothyroxine (SYNTHROID, LEVOTHROID) 137 MCG tablet Take 137 mcg by mouth daily before breakfast.  . NOVOLIN 70/30 RELION (70-30) 100  UNIT/ML injection Inject 50 Units into the skin 2 (two) times daily.  Marland Kitchen nystatin (MYCOSTATIN) 100000 UNIT/ML suspension Use as directed 5 mLs in the mouth or throat 4 (four) times daily.  Marland Kitchen olmesartan (BENICAR) 40 MG tablet Take 40 mg by mouth daily.  . pantoprazole (PROTONIX) 40 MG tablet Take 40 mg by mouth 2 (two) times daily.  . promethazine (PHENERGAN) 25 MG tablet Take 25 mg by mouth every 8 (eight) hours as needed for nausea or vomiting.  . temazepam (RESTORIL) 30 MG capsule Take 30 mg by mouth at bedtime.   . vitamin B-12 (CYANOCOBALAMIN) 1000 MCG tablet Take 1,000 mcg by mouth daily.    Allergies:   Codeine; Ambien [zolpidem tartrate]; Crestor [rosuvastatin]; Lipitor [atorvastatin]; Morphine and related; Penicillins; Statins; Dilaudid [hydromorphone hcl]; Amitriptyline; Ceftin [cefuroxime axetil]; Lovaza [omega-3-acid ethyl esters]; Lunesta [eszopiclone]; Lyrica [pregabalin]; and Metformin and related   Social History   Socioeconomic History  . Marital status: Married    Spouse name: None  . Number of children: 2  . Years of education: None  . Highest education level: None  Social Needs  . Financial resource strain: None  . Food insecurity - worry: None  . Food insecurity - inability: None  . Transportation needs - medical: None  . Transportation needs - non-medical: None  Occupational History  . None  Tobacco Use  . Smoking status: Never Smoker  . Smokeless tobacco: Never Used  Substance and Sexual Activity  . Alcohol use: No  . Drug use: No  . Sexual activity: Yes    Birth control/protection: Post-menopausal,  Surgical  Other Topics Concern  . None  Social History Narrative  . None     Family History:  The patient's family history includes Breast cancer (age of onset: 27) in her mother; Diabetes type II in her father and mother; Heart attack in her father; Hypertension in her mother; Hypothyroidism in her brother and father.   ROS:   Please see the history of present illness.    Review of Systems  Respiratory: Positive for cough.   Musculoskeletal: Positive for back pain and muscle cramps.  Psychiatric/Behavioral: Positive for depression.   All other systems reviewed and are negative    PHYSICAL EXAM:   VS:  BP 132/70   Pulse 97   Ht 5\' 3"  (1.6 m)   Wt 203 lb 9.6 oz (92.4 kg)   BMI 36.07 kg/m     Orthostatic VS for the past 24 hrs:  BP- Lying Pulse- Lying BP- Sitting Pulse- Sitting BP- Standing at 0 minutes Pulse- Standing at 0 minutes  09/04/17 0944 136/77 87 129/75 89 109/68 92    GEN: Well nourished, well developed, in no acute distress  HEENT: normal  Neck: no JVD, carotid bruits, or masses Cardiac: RRR; no murmurs, rubs, or gallops,no edema.  Intact distal pulses bilaterally.  Respiratory:  clear to auscultation bilaterally, normal work of breathing GI: soft, nontender, nondistended, + BS MS: no deformity or atrophy  Skin: warm and dry, no rash Neuro:  Alert and Oriented x 3, Strength and sensation are intact Psych: euthymic mood, full affect  Wt Readings from Last 3 Encounters:  09/04/17 203 lb 9.6 oz (92.4 kg)  03/23/17 208 lb 6.4 oz (94.5 kg)  03/21/17 208 lb 6.4 oz (94.5 kg)      Studies/Labs Reviewed:   EKG:  EKG is not ordered today   Recent Labs: 03/21/2017: BUN 7; Creatinine, Ser 0.99; Hemoglobin 9.7; Platelets 283; Potassium 4.0; Sodium  135   Lipid Panel    Component Value Date/Time   CHOL 195 05/02/2013 0800   TRIG 259 (H) 05/02/2013 0800   HDL 35 (L) 05/02/2013 0800   CHOLHDL 5.6 05/02/2013 0800   VLDL 52 (H) 05/02/2013 0800   LDLCALC 108 (H)  05/02/2013 0800    Additional studies/ records that were reviewed today include:  Office noteds from PCP    ASSESSMENT:    1. Postural dizziness with presyncope   2. PAC (premature atrial contraction)   3. Essential hypertension      PLAN:  In order of problems listed above:  1.  Postural dizziness with presyncope - ? Etiology.  It does not sound like an arrhythmia.  Usually she is standing up when it happens.  This last round of episodes was in the setting of a URI and I ? Whether it could be related to dehydration.  BP was fine right after one of the events.  She also has been under a lot of stress with the death of her mother.  Orthostatic BPs in the office were positive and recreated her dizziness when she stood up.  I have encouraged her to stay well hydrated.  I will give her a Rx for compression hose to wear during the day.  I would like her to keep a journal of BPs when she gets these episodes and call with results. I will get a 30 day event monitor to assess for other atrial arrhythmias such as PAF.  I will also get a 2D echo to assess LVF.She will followup with my PA in 4 weeks.   2.  PAC's - these are asymptomatic.  We discussed these and I reassured her that they are benign.  3.  HTN - Bp is well controlled on exam today.  She will continue on olmesartan 40mg  daily and amlodipine 5mg  daily.      Medication Adjustments/Labs and Tests Ordered: Current medicines are reviewed at length with the patient today.  Concerns regarding medicines are outlined above.  Medication changes, Labs and Tests ordered today are listed in the Patient Instructions below.  Patient Instructions  Medication Instructions:  Your physician recommends that you continue on your current medications as directed. Please refer to the Current Medication list given to you today.  Labwork: None ordered   Testing/Procedures: Your physician has requested that you have an echocardiogram. Echocardiography is  a painless test that uses sound waves to create images of your heart. It provides your doctor with information about the size and shape of your heart and how well your heart's chambers and valves are working. This procedure takes approximately one hour. There are no restrictions for this procedure.  Your physician has recommended that you wear an event monitor. Event monitors are medical devices that record the heart's electrical activity. Doctors most often [place] these monitors to diagnose arrhythmias. Arrhythmias are problems with the speed or rhythm of the heartbeat. The monitor is a small, portable device. You can wear one while you do your normal daily activities. This is usually used to diagnose what is causing palpitations/syncope (passing out).   Follow-Up: Your physician wants you to follow-up as needed with Dr. Radford Pax.   Any Other Special Instructions Will Be Listed Below (If Applicable).     If you need a refill on your cardiac medications before your next appointment, please call your pharmacy.      Signed, Fransico Him, MD  09/04/2017 9:43 AM    [facility] [place], [place], [place]  [address] Phone: [phone]; Fax: [phone]

## 2017-09-08 ENCOUNTER — Ambulatory Visit (INDEPENDENT_AMBULATORY_CARE_PROVIDER_SITE_OTHER): Payer: Medicare Other

## 2017-09-08 ENCOUNTER — Other Ambulatory Visit: Payer: Self-pay

## 2017-09-08 ENCOUNTER — Ambulatory Visit (HOSPITAL_COMMUNITY): Payer: Medicare Other | Attending: Cardiology

## 2017-09-08 ENCOUNTER — Other Ambulatory Visit: Payer: Self-pay | Admitting: Neurosurgery

## 2017-09-08 DIAGNOSIS — R42 Dizziness and giddiness: Secondary | ICD-10-CM

## 2017-09-08 DIAGNOSIS — R55 Syncope and collapse: Secondary | ICD-10-CM

## 2017-09-08 DIAGNOSIS — E119 Type 2 diabetes mellitus without complications: Secondary | ICD-10-CM | POA: Insufficient documentation

## 2017-09-08 DIAGNOSIS — Z87442 Personal history of urinary calculi: Secondary | ICD-10-CM | POA: Diagnosis not present

## 2017-09-08 DIAGNOSIS — Z86718 Personal history of other venous thrombosis and embolism: Secondary | ICD-10-CM | POA: Insufficient documentation

## 2017-09-08 DIAGNOSIS — M48062 Spinal stenosis, lumbar region with neurogenic claudication: Secondary | ICD-10-CM

## 2017-09-08 DIAGNOSIS — M797 Fibromyalgia: Secondary | ICD-10-CM | POA: Insufficient documentation

## 2017-09-08 DIAGNOSIS — D649 Anemia, unspecified: Secondary | ICD-10-CM | POA: Diagnosis not present

## 2017-09-08 DIAGNOSIS — M1990 Unspecified osteoarthritis, unspecified site: Secondary | ICD-10-CM | POA: Insufficient documentation

## 2017-09-08 DIAGNOSIS — E039 Hypothyroidism, unspecified: Secondary | ICD-10-CM | POA: Diagnosis not present

## 2017-09-08 DIAGNOSIS — I491 Atrial premature depolarization: Secondary | ICD-10-CM | POA: Diagnosis not present

## 2017-09-08 DIAGNOSIS — E785 Hyperlipidemia, unspecified: Secondary | ICD-10-CM | POA: Insufficient documentation

## 2017-09-08 DIAGNOSIS — I10 Essential (primary) hypertension: Secondary | ICD-10-CM | POA: Diagnosis not present

## 2017-09-22 DIAGNOSIS — D509 Iron deficiency anemia, unspecified: Secondary | ICD-10-CM | POA: Diagnosis not present

## 2017-09-24 ENCOUNTER — Ambulatory Visit
Admission: RE | Admit: 2017-09-24 | Discharge: 2017-09-24 | Disposition: A | Discharge status: Home / Self Care | Payer: Medicare Other | Source: Ambulatory Visit | Attending: Neurosurgery | Admitting: Neurosurgery

## 2017-09-24 DIAGNOSIS — M48062 Spinal stenosis, lumbar region with neurogenic claudication: Secondary | ICD-10-CM

## 2017-09-24 DIAGNOSIS — M48061 Spinal stenosis, lumbar region without neurogenic claudication: Secondary | ICD-10-CM | POA: Diagnosis not present

## 2017-09-24 DIAGNOSIS — M5125 Other intervertebral disc displacement, thoracolumbar region: Secondary | ICD-10-CM | POA: Diagnosis not present

## 2017-09-24 MED ORDER — GADOBENATE DIMEGLUMINE 529 MG/ML IV SOLN
20.0000 mL | Freq: Once | INTRAVENOUS | Status: AC | PRN
  Administered 2017-09-24: 20 mL via INTRAVENOUS

## 2017-09-25 ENCOUNTER — Other Ambulatory Visit: Payer: Self-pay | Admitting: Family Medicine

## 2017-09-25 DIAGNOSIS — Z1231 Encounter for screening mammogram for malignant neoplasm of breast: Secondary | ICD-10-CM

## 2017-09-28 ENCOUNTER — Ambulatory Visit: Payer: Medicare Other

## 2017-10-05 DIAGNOSIS — J018 Other acute sinusitis: Secondary | ICD-10-CM | POA: Diagnosis not present

## 2017-10-06 ENCOUNTER — Ambulatory Visit
Admission: RE | Admit: 2017-10-06 | Discharge: 2017-10-06 | Disposition: A | Discharge status: Home / Self Care | Payer: Medicare Other | Source: Ambulatory Visit | Attending: Family Medicine | Admitting: Family Medicine

## 2017-10-06 DIAGNOSIS — Z1231 Encounter for screening mammogram for malignant neoplasm of breast: Secondary | ICD-10-CM | POA: Diagnosis not present

## 2017-10-10 DIAGNOSIS — Z6836 Body mass index (BMI) 36.0-36.9, adult: Secondary | ICD-10-CM | POA: Diagnosis not present

## 2017-10-10 DIAGNOSIS — M4800 Spinal stenosis, site unspecified: Secondary | ICD-10-CM | POA: Diagnosis not present

## 2017-10-12 ENCOUNTER — Other Ambulatory Visit: Payer: Self-pay | Admitting: Neurosurgery

## 2017-10-16 ENCOUNTER — Encounter: Payer: Self-pay | Admitting: Cardiology

## 2017-10-16 ENCOUNTER — Ambulatory Visit (INDEPENDENT_AMBULATORY_CARE_PROVIDER_SITE_OTHER): Payer: Medicare Other | Admitting: Cardiology

## 2017-10-16 VITALS — BP 128/68 | HR 100 | Ht 63.0 in | Wt 206.0 lb

## 2017-10-16 DIAGNOSIS — R42 Dizziness and giddiness: Secondary | ICD-10-CM | POA: Diagnosis not present

## 2017-10-16 DIAGNOSIS — I10 Essential (primary) hypertension: Secondary | ICD-10-CM | POA: Diagnosis not present

## 2017-10-16 DIAGNOSIS — R55 Syncope and collapse: Secondary | ICD-10-CM

## 2017-10-16 DIAGNOSIS — I491 Atrial premature depolarization: Secondary | ICD-10-CM | POA: Diagnosis not present

## 2017-10-16 NOTE — Patient Instructions (Signed)
Medication Instructions: Your physician recommends that you continue on your current medications as directed. Please refer to the Current Medication list given to you today.  Labwork: None Ordered  Procedures/Testing: None Ordered  Follow-Up: Your physician wants you to follow-up in: 1 YEAR with Dr. Radford Pax.  You will receive a reminder letter in the mail two months in advance. If you don't receive a letter, please call our office to schedule the follow-up appointment.   If you need a refill on your cardiac medications before your next appointment, please call your pharmacy.

## 2017-10-16 NOTE — Progress Notes (Signed)
10/16/2017 [name]   01/10/1951  [id number]  Primary Physician [name], MD Primary Cardiologist: [name]   Reason for Visit/CC: f/u for Palpitations   HPI:   67 yo female with a history of DM2, hypothyroidism, HTN, depression, GERD, CKD stage 3, fibromyalgia and PACs. In 2014, she had a cardiac cath for evaluation for chest pain that showed normal coronaries.   She was recently sent by her PCP to be seen by [name] Sep 24, 2017 for palpitations and PACs noted on EKG. Pt had also endorsed an episode of near syncope, but based on description of event, [name] did not feel episode was related to cardiac etiology. [name] ordered a 30 day monitor and 2D echo. Monitor showed normal SR with frequent PACs but no other arrhthymias. No afib detected. 2D echo showed normal LVEF at 65-70% with normal wall motion and G1DD. No valvular dysfunction noted.   Pt was notified of study findings and feels reassured. She notes that she has been under a great deal of family stress lately, which has been burdensome and causing a great deal of anxiety. Her mother recently passed away in September 24, 2022 and since then, she and her siblings have been having disagreements regarding her mother's will.   I offered low dose BB, but pt declined at this time. She reports she has been trying self copping mechanisms, such as slow deep breathing, which helps at times.     Current Meds  Medication Sig  . acetaminophen (TYLENOL) 500 MG tablet Take 1,000 mg by mouth every 8 (eight) hours as needed for mild pain or headache.  . albuterol (PROVENTIL HFA;VENTOLIN HFA) 108 (90 Base) MCG/ACT inhaler Inhale 2 puffs into the lungs every 4 (four) hours as needed for wheezing or shortness of breath.  Marland Kitchen amLODipine (NORVASC) 5 MG tablet Take 5 mg by mouth daily.  Marland Kitchen aspirin 81 MG chewable tablet Chew 81 mg by mouth daily.  . benzonatate (TESSALON) 100 MG capsule Take 100 mg by mouth 3 (three) times daily as needed for cough.    . cholecalciferol (VITAMIN D) 1000 units tablet Take 1,000 Units by mouth daily.  . clobetasol (TEMOVATE) 0.05 % external solution Apply 1 application topically See admin instructions. Apply twice daily to scalp and ear as needed for itching/rash (do not apply to face/groin/underarms)  . cyclobenzaprine (FLEXERIL) 10 MG tablet Take 1 tablet (10 mg total) by mouth 3 (three) times daily as needed for muscle spasms.  . DULoxetine (CYMBALTA) 60 MG capsule Take 180 mg by mouth daily.   . fluticasone (FLONASE) 50 MCG/ACT nasal spray Place 2 sprays into both nostrils daily as needed for allergies or rhinitis.  . furosemide (LASIX) 20 MG tablet Take 20 mg by mouth daily as needed for fluid or edema.   Marland Kitchen HYDROcodone-acetaminophen (NORCO/VICODIN) 5-325 MG tablet Take 1-2 tablets by mouth every 6 (six) hours as needed for moderate pain.  Marland Kitchen JARDIANCE 10 MG TABS tablet Take 10 mg by mouth daily.  Marland Kitchen levothyroxine (SYNTHROID, LEVOTHROID) 137 MCG tablet Take 137 mcg by mouth daily before breakfast.  . NOVOLIN 70/30 RELION (70-30) 100 UNIT/ML injection Inject 50 Units into the skin 2 (two) times daily.  Marland Kitchen nystatin (MYCOSTATIN) 100000 UNIT/ML suspension Use as directed 5 mLs in the mouth or throat 4 (four) times daily.  Marland Kitchen olmesartan (BENICAR) 40 MG tablet Take 40 mg by mouth daily.  . pantoprazole (PROTONIX) 40 MG tablet Take 40 mg by mouth 2 (two) times daily.  Marland Kitchen  promethazine (PHENERGAN) 25 MG tablet Take 25 mg by mouth every 8 (eight) hours as needed for nausea or vomiting.  . temazepam (RESTORIL) 30 MG capsule Take 30 mg by mouth at bedtime.   . vitamin B-12 (CYANOCOBALAMIN) 1000 MCG tablet Take 1,000 mcg by mouth daily.   Allergies  Allergen Reactions  . Codeine Nausea And Vomiting  . Ambien [Zolpidem Tartrate] Other (See Comments)    Up sleep walking and eating  . Crestor [Rosuvastatin] Other (See Comments)    Muscle weakness, cramps and aching all over body  . Lipitor [Atorvastatin] Itching and Other  (See Comments)    Muscle weakness, cramps and aches all over body  . Morphine And Related Other (See Comments)    Flushing and feeling hot Tolerates with Diphenhydramine  . Penicillins Rash and Other (See Comments)    Caused Headaches also Has patient had a PCN reaction causing immediate rash, facial/tongue/throat swelling, SOB or lightheadedness with hypotension: No Has patient had a PCN reaction causing severe rash involving mucus membranes or skin necrosis: No Has patient had a PCN reaction that required hospitalization No Has patient had a PCN reaction occurring within the last 10 years: No If all of the above answers are "NO", then may proceed with Cephalosporin use.    . Statins Other (See Comments)    Muscle weakness, cramps and aching all over body  . Dilaudid [Hydromorphone Hcl] Other (See Comments)    Hallucinations/ argumentative, goes beserk  . Amitriptyline Other (See Comments)    Loopy feeling  . Ceftin [Cefuroxime Axetil] Other (See Comments)    Stomach pain   . Lovaza [Omega-3-Acid Ethyl Esters] Other (See Comments)    Stomach pain   . Lunesta [Eszopiclone] Other (See Comments)    Causes dizziness  . Lyrica [Pregabalin] Nausea Only  . Metformin And Related Nausea Only    Pt denies this intolerance   Past Medical History:  Diagnosis Date  . Anemia    iron deficiency  . Arthritis   . Blood transfusion   . Cervical spondylosis with myelopathy and radiculopathy   . Depression   . Diabetes mellitus    Type II  . Diastolic dysfunction   . Diverticulitis   . DJD (degenerative joint disease)   . DVT (deep venous thrombosis) (HCC)    times 2 lower leg  . Endometriosis   . Family history of adverse reaction to anesthesia    mom and dad PONV  . Fibromyalgia   . GERD (gastroesophageal reflux disease)    occ  . Hiatal hernia   . History of kidney stones   . Hyperlipidemia   . Hypertension   . Hypothyroidism   . Insomnia   . Mild aortic insufficiency    by  echo 04/2013  . Osteoarthritis    back and knee  . Ovarian cyst   . PONV (postoperative nausea and vomiting)   . RLS (restless legs syndrome)   . Thoracic compression fracture (HCC)   . Uterine fibroid   . UTI (lower urinary tract infection)   . Villous adenoma of right colon 04/08/2016  . Vitamin D deficiency disease    Family History  Problem Relation Age of Onset  . Heart attack Father        41s  . Hypothyroidism Father   . Diabetes type II Father   . Diabetes type II Mother   . Hypertension Mother   . Breast cancer Mother 41  . Hypothyroidism Brother   . Breast  cancer Maternal Aunt   . Breast cancer Cousin   . Breast cancer Maternal Aunt   . Breast cancer Cousin    Past Surgical History:  Procedure Laterality Date  . ANTERIOR CERVICAL DECOMP/DISCECTOMY FUSION N/A 03/23/2017   Procedure: ANTERIOR CERVICAL DECOMPRESSION/DISCECTOMY FUSION, INTERBODY PROSTHESIS,PLATE CERVICAL THREE- CERVICAL FOUR, CERVICAL FOUR- CERVICAL FIVE, CERVICAL FIVE- CERVICAL SIX;  Surgeon: Tressie Stalker, MD;  Location: [facility] OR;  Service: Neurosurgery;  Laterality: N/A;  . APPENDECTOMY    . BACK SURGERY  ,2005   April  2013 - [facility]  . BREAST SURGERY     breast reduction  . CARDIAC CATHETERIZATION  04/2013   normal coronary arteries and normal LVF  . CARPAL TUNNEL RELEASE  06/05/2012   Procedure: CARPAL TUNNEL RELEASE;  Surgeon: Wyn Forster., MD;  Location: [facility];  Service: Orthopedics;  Laterality: Left;  . CESAREAN SECTION     x 2  . CHOLECYSTECTOMY    . COLECTOMY  04/08/2016  . DILATION AND CURETTAGE OF UTERUS    . DORSAL COMPARTMENT RELEASE  06/05/2012   Procedure: RELEASE DORSAL COMPARTMENT (DEQUERVAIN);  Surgeon: Wyn Forster., MD;  [facility];  Service: Orthopedics;  Laterality: Left;  Excision of mixoid cyst also  . EYE SURGERY     cataracts bilateral  . JOINT REPLACEMENT     Bilateral knee  . KNEE ARTHROPLASTY  09     lft partial  . KNEE ARTHROPLASTY     rt  . LAPAROSCOPIC PARTIAL COLECTOMY N/A 04/08/2016   Procedure: LAPAROSCOPIC ASSISTED ASCENDING COLECTOMY POSSIBLE OPEN COLECTOMY;  Surgeon: Claud Kelp, MD;  Location: [facility] OR;  Service: General;  Laterality: N/A;  . orthopedic surgeries     multiple  . REDUCTION MAMMAPLASTY Bilateral   . SHOULDER OPEN ROTATOR CUFF REPAIR     rt and lft  . TUBAL LIGATION     btsp   Social History   Socioeconomic History  . Marital status: Married    Spouse name: Not on file  . Number of children: 2  . Years of education: Not on file  . Highest education level: Not on file  Social Needs  . Financial resource strain: Not on file  . Food insecurity - worry: Not on file  . Food insecurity - inability: Not on file  . Transportation needs - medical: Not on file  . Transportation needs - non-medical: Not on file  Occupational History  . Not on file  Tobacco Use  . Smoking status: Never Smoker  . Smokeless tobacco: Never Used  Substance and Sexual Activity  . Alcohol use: No  . Drug use: No  . Sexual activity: Yes    Birth control/protection: Post-menopausal, Surgical  Other Topics Concern  . Not on file  Social History Narrative  . Not on file     Review of Systems: General: negative for chills, fever, night sweats or weight changes.  Cardiovascular: negative for chest pain, dyspnea on exertion, edema, orthopnea, palpitations, paroxysmal nocturnal dyspnea or shortness of breath Dermatological: negative for rash Respiratory: negative for cough or wheezing Urologic: negative for hematuria Abdominal: negative for nausea, vomiting, diarrhea, bright red blood per rectum, melena, or hematemesis Neurologic: negative for visual changes, syncope, or dizziness All other systems reviewed and are otherwise negative except as noted above.   Physical Exam:  Blood pressure 128/68, pulse 100, height 5\' 3"  (1.6 m), weight 206 lb (93.4 kg).  General appearance:  alert, cooperative and  no distress Neck: no carotid bruit and no JVD Lungs: clear to auscultation bilaterally Heart: regular rate and rhythm, S1, S2 normal, no murmur, click, rub or gallop Extremities: extremities normal, atraumatic, no cyanosis or edema Pulses: 2+ and symmetric Skin: Skin color, texture, turgor normal. No rashes or lesions Neurologic: Grossly normal  EKG sinus tach 100 bpm -- personally reviewed   ASSESSMENT AND PLAN:   1. PACs: Recent 30 day montior showed frequent PACs. No afib/flutter. Echo showed normal LVEF and normal valve function. Pt with recent increased family stress, causing more anxity, as outlined below. I offered low dose BB, but pt declined at this time. She reports she has been trying self copping mechanisms, such as slow deep breathing, which helps at times. Pt encouraged to stay well hydrated and to avoid triggers that may increase HR.   2. Chronic LBP/Surgical Clearance:  Pt scheduled to undergo lumbar fusion surgery next month. Based on normal LHC in 2014, recent normal 2D echo and benign 30 day monitor, along with lack of ischemic symptoms (no exertional CP or dyspnea), I feel that she can be cleared for back surgery w/o need for further cardiac testing. She is of acceptable risk.    3. HTN: controlled on current regimen.   4. DM: followed by PCP.    Follow-Up w/ [name] in 1 year   Nazir Hacker Delmer Islam, MHS [facility] 10/16/2017 10:28 AM

## 2017-10-18 NOTE — Progress Notes (Signed)
Unable to reach patient to review results. Letter mailed to patient.

## 2017-11-06 NOTE — Pre-Procedure Instructions (Signed)
[name]  11/06/2017      [id number] - [place] - [address] [place] [address] Phone: [phone] Fax: [phone]    Your procedure is scheduled on Wednesday April 10.  Report to [facility] Admitting at 11:20 A.M.  Call this number if you have problems the morning of surgery:  [phone]   Remember:  Do not eat food or drink liquids after midnight.  Take these medicines the morning of surgery with A SIP OF WATER:   Amlodipine (norvasc) Duloxetine (Cymbalta) Pantoprazole (protonix) Synthroid (levothyroxine) Cyclobenzaprine (flexeril) if needed Flonase if needed Acetaminophen (tylenol) if needed OR Hydrocodone-Acetaminophen (if needed)  DO NOT TAKE Jardiance the day of surgery  7 days prior to surgery STOP taking any Aleve, Naproxen, Ibuprofen, Motrin, Advil, Goody's, BC's, all herbal medications, fish oil, and all vitamins  **Follow surgeon's instructions on stopping Aspirin. If no instructions were given please call your surgeon's office**     How to Manage Your Diabetes Before and After Surgery  Why is it important to control my blood sugar before and after surgery? . Improving blood sugar levels before and after surgery helps healing and can limit problems. . A way of improving blood sugar control is eating a healthy diet by: o  Eating less sugar and carbohydrates o  Increasing activity/exercise o  Talking with your doctor about reaching your blood sugar goals . High blood sugars (greater than 180 mg/dL) can raise your risk of infections and slow your recovery, so you will need to focus on controlling your diabetes during the weeks before surgery. . Make sure that the doctor who takes care of your diabetes knows about your planned surgery including the date and location.  How do I manage my blood sugar before surgery? . Check your blood sugar at least 4 times a  day, starting 2 days before surgery, to make sure that the level is not too high or low. o Check your blood sugar the morning of your surgery when you wake up and every 2 hours until you get to the Short Stay unit. . If your blood sugar is less than 70 mg/dL, you will need to treat for low blood sugar: o Do not take insulin. o Treat a low blood sugar (less than 70 mg/dL) with  cup of clear juice (cranberry or apple), 4 glucose tablets, OR glucose gel. Recheck blood sugar in 15 minutes after treatment (to make sure it is greater than 70 mg/dL). If your blood sugar is not greater than 70 mg/dL on recheck, call [phone] o  for further instructions. . Report your blood sugar to the short stay nurse when you get to Short Stay.  . If you are admitted to the hospital after surgery: o Your blood sugar will be checked by the staff and you will probably be given insulin after surgery (instead of oral diabetes medicines) to make sure you have good blood sugar levels. o The goal for blood sugar control after surgery is 80-180 mg/dL.              WHAT DO I DO ABOUT MY DIABETES MEDICATION?   Marland Kitchen Do not take oral diabetes medicines (pills) the morning of surgery.  . THE NIGHT BEFORE SURGERY, take ___________ units of ___________insulin.       . THE MORNING OF SURGERY, take _____________ units of __________insulin.  . The day  of surgery, do not take other diabetes injectables, including Byetta (exenatide), Bydureon (exenatide ER), Victoza (liraglutide), or Trulicity (dulaglutide).  . If your CBG is greater than 220 mg/dL, you may take  of your sliding scale (correction) dose of insulin.  Other Instructions:          Patient Signature:  Date:   Nurse Signature:  Date:   Reviewed and Endorsed by [facility] Patient Education Committee, August 2015  Do not wear jewelry, make-up or nail polish.  Do not wear lotions, powders, or perfumes, or deodorant.  Do not shave 48 hours prior  to surgery.  Men may shave face and neck.  Do not bring valuables to the hospital.  [facility] is not responsible for any belongings or valuables.  Contacts, dentures or bridgework may not be worn into surgery.  Leave your suitcase in the car.  After surgery it may be brought to your room.  For patients admitted to the hospital, discharge time will be determined by your treatment team.  Patients discharged the day of surgery will not be allowed to drive home.    Special instructions:    [place]- Preparing For Surgery  Before surgery, you can play an important role. Because skin is not sterile, your skin needs to be as free of germs as possible. You can reduce the number of germs on your skin by washing with CHG (chlorahexidine gluconate) Soap before surgery.  CHG is an antiseptic cleaner which kills germs and bonds with the skin to continue killing germs even after washing.  Please do not use if you have an allergy to CHG or antibacterial soaps. If your skin becomes reddened/irritated stop using the CHG.  Do not shave (including legs and underarms) for at least 48 hours prior to first CHG shower. It is OK to shave your face.  Please follow these instructions carefully.   1. Shower the NIGHT BEFORE SURGERY and the MORNING OF SURGERY with CHG.   2. If you chose to wash your hair, wash your hair first as usual with your normal shampoo.  3. After you shampoo, rinse your hair and body thoroughly to remove the shampoo.  4. Use CHG as you would any other liquid soap. You can apply CHG directly to the skin and wash gently with a scrungie or a clean washcloth.   5. Apply the CHG Soap to your body ONLY FROM THE NECK DOWN.  Do not use on open wounds or open sores. Avoid contact with your eyes, ears, mouth and genitals (private parts). Wash Face and genitals (private parts)  with your normal soap.  6. Wash thoroughly, paying special attention to the area where your surgery will be  performed.  7. Thoroughly rinse your body with warm water from the neck down.  8. DO NOT shower/wash with your normal soap after using and rinsing off the CHG Soap.  9. Pat yourself dry with a CLEAN TOWEL.  10. Wear CLEAN PAJAMAS to bed the night before surgery, wear comfortable clothes the morning of surgery  11. Place CLEAN SHEETS on your bed the night of your first shower and DO NOT SLEEP WITH PETS.    Day of Surgery: Do not apply any deodorants/lotions. Please wear clean clothes to the hospital/surgery center.      Please read over the following fact sheets that you were given. Coughing and Deep Breathing, MRSA Information and Surgical Site Infection Prevention

## 2017-11-06 NOTE — Progress Notes (Signed)
[name]            11/06/2017                          [id number] - [place], [place] - [address] [place] [address] Phone: [phone] Fax: [phone]              Your procedure is scheduled on Wednesday April 10.            Report to [facility] Admitting at 11:20 A.M.            Call this number if you have problems the morning of surgery:            [phone]             Remember:            Do not eat food or drink liquids after midnight.            Take these medicines the morning of surgery with A SIP OF WATER:   Amlodipine (norvasc) Duloxetine (Cymbalta) Pantoprazole (protonix) Synthroid (levothyroxine) Cyclobenzaprine (flexeril) if needed Flonase if needed Acetaminophen (tylenol) if needed OR Hydrocodone-Acetaminophen (if needed)  DO NOT TAKE Jardiance the day of surgery  7 days prior to surgery STOP taking any Aleve, Naproxen, Ibuprofen, Motrin, Advil, Goody's, BC's, all herbal medications, fish oil, and all vitamins  **Follow surgeon's instructions on stopping Aspirin. If no instructions were given please call your surgeon's office**  WHAT DO I DO ABOUT MY DIABETES MEDICATION?   Do not take oral diabetes medicines (pills) the morning of surgery.   THE NIGHT BEFORE SURGERY, take 35 units of Nolvolin 70/30 insulin.                                        THE MORNING OF SURGERY, take no insulin.  The day of surgery, do not take other diabetes injectables, including Byetta (exenatide), Bydureon (exenatide ER), Victoza (liraglutide), or Trulicity (dulaglutide).   HOW TO MANAGE YOUR DIABETES BEFORE AND AFTER SURGERY  Why is it important to control my blood sugar before and after surgery?  Improving blood sugar levels before and after surgery helps healing and can limit problems.  A way of improving blood sugar control is eating a healthy diet by: ?   Eating less sugar and carbohydrates ?  Increasing activity/exercise ?  Talking with your doctor about reaching your blood sugar goals  High blood sugars (greater than 180 mg/dL) can raise your risk of infections and slow your recovery, so you will need to focus on controlling your diabetes during the weeks before surgery.  Make sure that the doctor who takes care of your diabetes knows about your planned surgery including the date and location.  How do I manage my blood sugar before surgery?  Check your blood sugar at least 4 times a day, starting 2 days before surgery, to make sure that the level is not too high or low. ? Check your blood sugar the morning of your surgery when you wake up and every 2 hours until you get to the Short Stay unit.  If your blood sugar is less than 70 mg/dL, you will need to treat for low blood sugar: ? Do not take insulin. ?  Treat a low blood sugar (less than 70 mg/dL) with  cup of clear juice (cranberry or apple), 4glucose tablets, OR glucose gel. Recheck blood sugar in 15 minutes after treatment (to make sure it is greater than 70 mg/dL). If your blood sugar is not greater than 70 mg/dL on recheck, call [phone] ?  for further instructions.  Report your blood sugar to the short stay nurse when you get to Short Stay.   If you are admitted to the hospital after surgery: ? Your blood sugar will be checked by the staff and you will probably be given insulin after surgery (instead of oral diabetes medicines) to make sure you have good blood sugar levels. ? The goal for blood sugar control after surgery is 80-180 mg/dL.                                                                                                                               Do not wear jewelry, make-up or nail polish.            Do not wear lotions, powders, or perfumes, or deodorant.            Do not shave 48 hours prior to surgery.  Men may shave face and neck.            Do  not bring valuables to the hospital.            [facility] is not responsible for any belongings or valuables.  Contacts, dentures or bridgework may not be worn into surgery.  Leave your suitcase in the car.  After surgery it may be brought to your room.  For patients admitted to the hospital, discharge time will be determined by your treatment team.  Patients discharged the day of surgery will not be allowed to drive home.    Special instructions:    [name]- Preparing For Surgery  Before surgery, you can play an important role. Because skin is not sterile, your skin needs to be as free of germs as possible. You can reduce the number of germs on your skin by washing with CHG (chlorahexidine gluconate) Soap before surgery.  CHG is an antiseptic cleaner which kills germs and bonds with the skin to continue killing germs even after washing.  Please do not use if you have an allergy to CHG or antibacterial soaps. If your skin becomes reddened/irritated stop using the CHG.  Do not shave (including legs and underarms) for at least 48 hours prior to first CHG shower. It is OK to shave your face.  Please follow these instructions carefully.  1. Shower the NIGHT BEFORE SURGERY and the MORNING OF SURGERY with CHG.   2. If you chose to wash your hair, wash your hair first as usual with your normal shampoo.  3. After you shampoo, rinse your hair and body thoroughly to remove the shampoo.  4. Use CHG as you would any other liquid soap. You can apply CHG directly to the skin and wash gently with a scrungie or a clean washcloth.   5. Apply the CHG Soap to your body ONLY FROM THE NECK DOWN.  Do not use on open wounds or open sores. Avoid contact with your eyes, ears, mouth and genitals (private parts). Wash Face and genitals (private parts)  with your normal  soap.  6. Wash thoroughly, paying special attention to the area where your surgery will be performed.  7. Thoroughly rinse your body with warm water from the neck down.  8. DO NOT shower/wash with your normal soap after using and rinsing off the CHG Soap.  9. Pat yourself dry with a CLEAN TOWEL.  10. Wear CLEAN PAJAMAS to bed the night before surgery, wear comfortable clothes the morning of surgery  11. Place CLEAN SHEETS on your bed the night of your first shower and DO NOT SLEEP WITH PETS.    Day of Surgery: Do not apply any deodorants/lotions. Please wear clean clothes to the hospital/surgery center.      Please read over the following fact sheets that you were given. Coughing and Deep Breathing, MRSA Information and Surgical Site Infection Prevention

## 2017-11-06 NOTE — Progress Notes (Addendum)
PCP: Harlan Stains, MD  Cardiologist: Fransico Him, MD  EKG: 10/16/17 in EPIC  Stress test: pt denies  ECHO: 09/08/17 in EPIC  Cardiac Cath: 05/03/13 in EPIC  Chest x-ray:03/05/17 -1 view-in Epic  Patient's blood glucose was 51 upon arrival to PAT appointment. She was asymptomatic and reports that her blood sugar drops at times.  She drank a small bottle of coke during her visit and her blood glucose went up to 70.  She is going to eat once she leaves her appointment and reports that she feels fine and is having no symptoms.

## 2017-11-07 ENCOUNTER — Encounter (HOSPITAL_COMMUNITY): Payer: Self-pay

## 2017-11-07 ENCOUNTER — Other Ambulatory Visit: Payer: Self-pay

## 2017-11-07 ENCOUNTER — Encounter (HOSPITAL_COMMUNITY)
Admission: RE | Admit: 2017-11-07 | Discharge: 2017-11-07 | Disposition: A | Discharge status: Home / Self Care | Payer: Medicare Other | Source: Ambulatory Visit | Attending: Neurosurgery | Admitting: Neurosurgery

## 2017-11-07 DIAGNOSIS — K219 Gastro-esophageal reflux disease without esophagitis: Secondary | ICD-10-CM | POA: Insufficient documentation

## 2017-11-07 DIAGNOSIS — E119 Type 2 diabetes mellitus without complications: Secondary | ICD-10-CM | POA: Diagnosis not present

## 2017-11-07 DIAGNOSIS — E039 Hypothyroidism, unspecified: Secondary | ICD-10-CM | POA: Diagnosis not present

## 2017-11-07 DIAGNOSIS — Z79899 Other long term (current) drug therapy: Secondary | ICD-10-CM | POA: Insufficient documentation

## 2017-11-07 DIAGNOSIS — F329 Major depressive disorder, single episode, unspecified: Secondary | ICD-10-CM | POA: Diagnosis not present

## 2017-11-07 DIAGNOSIS — Z79890 Hormone replacement therapy (postmenopausal): Secondary | ICD-10-CM | POA: Diagnosis not present

## 2017-11-07 DIAGNOSIS — G4700 Insomnia, unspecified: Secondary | ICD-10-CM | POA: Insufficient documentation

## 2017-11-07 DIAGNOSIS — D649 Anemia, unspecified: Secondary | ICD-10-CM | POA: Insufficient documentation

## 2017-11-07 DIAGNOSIS — I10 Essential (primary) hypertension: Secondary | ICD-10-CM | POA: Diagnosis not present

## 2017-11-07 DIAGNOSIS — M797 Fibromyalgia: Secondary | ICD-10-CM | POA: Diagnosis not present

## 2017-11-07 DIAGNOSIS — Z7982 Long term (current) use of aspirin: Secondary | ICD-10-CM | POA: Insufficient documentation

## 2017-11-07 DIAGNOSIS — E785 Hyperlipidemia, unspecified: Secondary | ICD-10-CM | POA: Insufficient documentation

## 2017-11-07 DIAGNOSIS — Z01818 Encounter for other preprocedural examination: Secondary | ICD-10-CM | POA: Diagnosis not present

## 2017-11-07 DIAGNOSIS — M48062 Spinal stenosis, lumbar region with neurogenic claudication: Secondary | ICD-10-CM | POA: Insufficient documentation

## 2017-11-07 LAB — CBC
HCT: 33.2 % — ABNORMAL LOW (ref 36.0–46.0)
Hemoglobin: 9.2 g/dL — ABNORMAL LOW (ref 12.0–15.0)
MCH: 18.2 pg — AB (ref 26.0–34.0)
MCHC: 27.7 g/dL — ABNORMAL LOW (ref 30.0–36.0)
MCV: 65.6 fL — AB (ref 78.0–100.0)
PLATELETS: 387 10*3/uL (ref 150–400)
RBC: 5.06 MIL/uL (ref 3.87–5.11)
RDW: 18.5 % — ABNORMAL HIGH (ref 11.5–15.5)
WBC: 8.2 10*3/uL (ref 4.0–10.5)

## 2017-11-07 LAB — BASIC METABOLIC PANEL
Anion gap: 10 (ref 5–15)
BUN: 11 mg/dL (ref 6–20)
CALCIUM: 9.1 mg/dL (ref 8.9–10.3)
CHLORIDE: 100 mmol/L — AB (ref 101–111)
CO2: 26 mmol/L (ref 22–32)
CREATININE: 1.06 mg/dL — AB (ref 0.44–1.00)
GFR calc non Af Amer: 53 mL/min — ABNORMAL LOW (ref >60)
Glucose, Bld: 77 mg/dL (ref 65–99)
Potassium: 3.6 mmol/L (ref 3.5–5.1)
SODIUM: 136 mmol/L (ref 135–145)

## 2017-11-07 LAB — GLUCOSE, CAPILLARY
GLUCOSE-CAPILLARY: 51 mg/dL — AB (ref 65–99)
Glucose-Capillary: 70 mg/dL (ref 65–99)

## 2017-11-07 LAB — SURGICAL PCR SCREEN
MRSA, PCR: NEGATIVE
STAPHYLOCOCCUS AUREUS: NEGATIVE

## 2017-11-07 LAB — HEMOGLOBIN A1C
HEMOGLOBIN A1C: 8.4 % — AB (ref 4.8–5.6)
Mean Plasma Glucose: 194.38 mg/dL

## 2017-11-07 NOTE — Progress Notes (Addendum)
Anesthesia Chart Review:  Pt is a 67 year old female scheduled for L2-3 PLIF, posterior instrumentation, possible interbody prosthesis on 11/15/2017 with Newman Pies, MD  - PCP is Harlan Stains, MD. - Endocrinologist is Dr. Buddy Duty. - Cardiologist is Fransico Him, MD. Arletha Pili for surgery at last office visit 10/16/17 with Ellen Henri, PA  PMH includes: HTN, hypothyroidism, iron deficiency anemia, hyperlipidemia, DVT, diastolic dysfunction, aortic insufficiency (only trivial AI, mild MR, mild-moderate pulmonary hypertension 04/2016), DM, post-op N/V, GERD.  Never smoker.  BMI 36.5. S/p ACDF 03/23/17. S/p colectomy 04/08/16. S/p lumbar fusion 11/15/11  Medications include: Amlodipine, ASA 81 mg, doxycycline, Lasix, Jardiance, levothyroxine, Novolin 70/30, olmesartan, Protonix  BP (!) 158/59   Pulse 90   Temp 36.7 C (Oral)   Resp 18   Ht 5\' 3"  (1.6 m)   Wt 205 lb 3 oz (93.1 kg)   SpO2 100%   BMI 36.35 kg/m   Preoperative labs reviewed.   - H/H 9.2/33.2. This is consistent with prior results. I left voicemail for [facility] in Dr. Arnoldo Morale' office with these results. T&S done at pre-admission testing.  - HbA1c 8.4, glucose 77  1 view CXR 03/05/17: Suboptimal inspiration.  No active cardiopulmonary process.  EKG 10/16/17: NSR. LAD. Minimal voltage criteria for LVH, may be normal variant. Anterolateral infarct, age undetermined  Echo 09/08/17:  - Left ventricle: The cavity size was normal. Wall thickness was increased in a pattern of moderate LVH. Systolic function was vigorous. The estimated ejection fraction was in the range of 65% to 70%. Wall motion was normal; there were no regional wall motion abnormalities. Doppler parameters are consistent with abnormal left ventricular relaxation (grade 1 diastolic dysfunction). - Mitral valve: Calcified annulus. - Left atrium: The atrium was mildly dilated.  Cardiac event monitor 09/08/17:   Normal sinus rhtyhm with average heart rate 95bpm. Sinus  tachycardia up to 120bpm.  Frequent PACs  Nuclear stress test 05/02/13: IMPRESSION: 1. Possible small area of pharmacologically induced ischemia involving the inferior lateral wall of the left ventricle. 2. No scintigraphic evidence of prior infarction. 3. Normal wall motion. Ejection fraction - 63%. Cardiac cath recommended.  Cardiac cath 05/03/13: HEMODYNAMICS:Aortic pressure was 505/39JQBH; LV systolic pressure was 419FXTK; LVEDP 60mmHg. There was no gradient between the left ventricle and aorta.  ANGIOGRAPHIC DATA: Left main: No angiographic CAD. Left anterior descending (LAD): No angiographic CAD. Circumflex artery (CIRC): No angiographic CAD. Right coronary artery (RCA): No angiographic CAD. LEFT VENTRICULOGRAM:Normal left ventricular wall motion and systolic function with an estimated ejection fraction of 60%.  IMPRESSIONS: 1. No angiographically significant CAD 2. Normal left ventricular systolic function. LVEDP 18 mmHg. Ejection fraction 60%.   If no changes, I anticipate pt can proceed with surgery as scheduled.   Willeen Cass, FNP-BC [facility]/Anesthesiology Phone: [phone] 11/07/2017 2:17 PM

## 2017-11-15 ENCOUNTER — Inpatient Hospital Stay (HOSPITAL_COMMUNITY): Payer: Medicare Other | Admitting: Certified Registered Nurse Anesthetist

## 2017-11-15 ENCOUNTER — Encounter (HOSPITAL_COMMUNITY)
Admission: RE | Disposition: A | Discharge status: Home / Self Care | Payer: Self-pay | Source: Ambulatory Visit | Attending: Neurosurgery

## 2017-11-15 ENCOUNTER — Inpatient Hospital Stay (HOSPITAL_COMMUNITY)
Admission: RE | Admit: 2017-11-15 | Discharge: 2017-11-17 | DRG: 454 | Disposition: A | Discharge status: Home / Self Care | Payer: Medicare Other | Source: Ambulatory Visit | Attending: Neurosurgery | Admitting: Neurosurgery

## 2017-11-15 ENCOUNTER — Encounter (HOSPITAL_COMMUNITY): Payer: Self-pay

## 2017-11-15 ENCOUNTER — Inpatient Hospital Stay (HOSPITAL_COMMUNITY): Payer: Medicare Other | Admitting: Emergency Medicine

## 2017-11-15 ENCOUNTER — Inpatient Hospital Stay (HOSPITAL_COMMUNITY): Payer: Medicare Other

## 2017-11-15 DIAGNOSIS — Z880 Allergy status to penicillin: Secondary | ICD-10-CM | POA: Diagnosis not present

## 2017-11-15 DIAGNOSIS — Z419 Encounter for procedure for purposes other than remedying health state, unspecified: Secondary | ICD-10-CM

## 2017-11-15 DIAGNOSIS — Z87442 Personal history of urinary calculi: Secondary | ICD-10-CM | POA: Diagnosis not present

## 2017-11-15 DIAGNOSIS — Z96653 Presence of artificial knee joint, bilateral: Secondary | ICD-10-CM | POA: Diagnosis present

## 2017-11-15 DIAGNOSIS — Z833 Family history of diabetes mellitus: Secondary | ICD-10-CM

## 2017-11-15 DIAGNOSIS — Z885 Allergy status to narcotic agent status: Secondary | ICD-10-CM

## 2017-11-15 DIAGNOSIS — M5416 Radiculopathy, lumbar region: Secondary | ICD-10-CM | POA: Diagnosis not present

## 2017-11-15 DIAGNOSIS — G4700 Insomnia, unspecified: Secondary | ICD-10-CM | POA: Diagnosis present

## 2017-11-15 DIAGNOSIS — M48061 Spinal stenosis, lumbar region without neurogenic claudication: Secondary | ICD-10-CM | POA: Diagnosis not present

## 2017-11-15 DIAGNOSIS — Z7951 Long term (current) use of inhaled steroids: Secondary | ICD-10-CM

## 2017-11-15 DIAGNOSIS — E1165 Type 2 diabetes mellitus with hyperglycemia: Secondary | ICD-10-CM | POA: Diagnosis not present

## 2017-11-15 DIAGNOSIS — M5116 Intervertebral disc disorders with radiculopathy, lumbar region: Secondary | ICD-10-CM | POA: Diagnosis not present

## 2017-11-15 DIAGNOSIS — Z79899 Other long term (current) drug therapy: Secondary | ICD-10-CM

## 2017-11-15 DIAGNOSIS — Z8349 Family history of other endocrine, nutritional and metabolic diseases: Secondary | ICD-10-CM

## 2017-11-15 DIAGNOSIS — M4316 Spondylolisthesis, lumbar region: Secondary | ICD-10-CM | POA: Diagnosis not present

## 2017-11-15 DIAGNOSIS — I10 Essential (primary) hypertension: Secondary | ICD-10-CM | POA: Diagnosis present

## 2017-11-15 DIAGNOSIS — K449 Diaphragmatic hernia without obstruction or gangrene: Secondary | ICD-10-CM | POA: Diagnosis not present

## 2017-11-15 DIAGNOSIS — M48062 Spinal stenosis, lumbar region with neurogenic claudication: Principal | ICD-10-CM | POA: Diagnosis present

## 2017-11-15 DIAGNOSIS — Z981 Arthrodesis status: Secondary | ICD-10-CM | POA: Diagnosis not present

## 2017-11-15 DIAGNOSIS — Z9049 Acquired absence of other specified parts of digestive tract: Secondary | ICD-10-CM | POA: Diagnosis not present

## 2017-11-15 DIAGNOSIS — M5136 Other intervertebral disc degeneration, lumbar region: Secondary | ICD-10-CM | POA: Diagnosis not present

## 2017-11-15 DIAGNOSIS — E11649 Type 2 diabetes mellitus with hypoglycemia without coma: Secondary | ICD-10-CM | POA: Diagnosis not present

## 2017-11-15 DIAGNOSIS — E039 Hypothyroidism, unspecified: Secondary | ICD-10-CM | POA: Diagnosis present

## 2017-11-15 DIAGNOSIS — K219 Gastro-esophageal reflux disease without esophagitis: Secondary | ICD-10-CM | POA: Diagnosis not present

## 2017-11-15 DIAGNOSIS — G2581 Restless legs syndrome: Secondary | ICD-10-CM | POA: Diagnosis not present

## 2017-11-15 DIAGNOSIS — Z888 Allergy status to other drugs, medicaments and biological substances status: Secondary | ICD-10-CM

## 2017-11-15 DIAGNOSIS — E785 Hyperlipidemia, unspecified: Secondary | ICD-10-CM | POA: Diagnosis present

## 2017-11-15 DIAGNOSIS — D62 Acute posthemorrhagic anemia: Secondary | ICD-10-CM | POA: Diagnosis not present

## 2017-11-15 DIAGNOSIS — M797 Fibromyalgia: Secondary | ICD-10-CM | POA: Diagnosis present

## 2017-11-15 DIAGNOSIS — E559 Vitamin D deficiency, unspecified: Secondary | ICD-10-CM | POA: Diagnosis present

## 2017-11-15 DIAGNOSIS — Z7982 Long term (current) use of aspirin: Secondary | ICD-10-CM

## 2017-11-15 DIAGNOSIS — Z86718 Personal history of other venous thrombosis and embolism: Secondary | ICD-10-CM

## 2017-11-15 DIAGNOSIS — Z8249 Family history of ischemic heart disease and other diseases of the circulatory system: Secondary | ICD-10-CM | POA: Diagnosis not present

## 2017-11-15 DIAGNOSIS — Z794 Long term (current) use of insulin: Secondary | ICD-10-CM

## 2017-11-15 DIAGNOSIS — Z79890 Hormone replacement therapy (postmenopausal): Secondary | ICD-10-CM

## 2017-11-15 DIAGNOSIS — M4326 Fusion of spine, lumbar region: Secondary | ICD-10-CM | POA: Diagnosis not present

## 2017-11-15 LAB — GLUCOSE, CAPILLARY
GLUCOSE-CAPILLARY: 128 mg/dL — AB (ref 65–99)
GLUCOSE-CAPILLARY: 171 mg/dL — AB (ref 65–99)
GLUCOSE-CAPILLARY: 120 mg/dL — AB (ref 65–99)
Glucose-Capillary: 25 mg/dL — CL (ref 65–99)

## 2017-11-15 SURGERY — POSTERIOR LUMBAR FUSION 1 LEVEL
Anesthesia: General | Site: Back

## 2017-11-15 MED ORDER — VANCOMYCIN HCL IN DEXTROSE 750-5 MG/150ML-% IV SOLN
750.0000 mg | Freq: Once | INTRAVENOUS | Status: AC
Start: 2017-11-16 — End: 2017-11-16
  Administered 2017-11-15: 750 mg via INTRAVENOUS
  Filled 2017-11-15: qty 150

## 2017-11-15 MED ORDER — ONDANSETRON HCL 4 MG/2ML IJ SOLN: 4.0000 mg | Freq: Once | INTRAMUSCULAR | Status: DC | PRN

## 2017-11-15 MED ORDER — PANTOPRAZOLE SODIUM 40 MG PO TBEC
40.0000 mg | DELAYED_RELEASE_TABLET | Freq: Two times a day (BID) | ORAL | Status: DC
  Administered 2017-11-15 – 2017-11-17 (×4): 40 mg via ORAL
  Filled 2017-11-15 (×4): qty 1

## 2017-11-15 MED ORDER — VANCOMYCIN HCL IN DEXTROSE 1-5 GM/200ML-% IV SOLN
INTRAVENOUS | Status: AC
  Filled 2017-11-15: qty 200

## 2017-11-15 MED ORDER — MIDAZOLAM HCL 2 MG/2ML IJ SOLN
INTRAMUSCULAR | Status: DC | PRN
  Administered 2017-11-15: 2 mg via INTRAVENOUS

## 2017-11-15 MED ORDER — LEVOTHYROXINE SODIUM 75 MCG PO TABS
150.0000 ug | ORAL_TABLET | Freq: Every day | ORAL | Status: DC
  Administered 2017-11-16 – 2017-11-17 (×2): 150 ug via ORAL
  Filled 2017-11-15 (×2): qty 2

## 2017-11-15 MED ORDER — FENTANYL CITRATE (PF) 250 MCG/5ML IJ SOLN
INTRAMUSCULAR | Status: AC
  Filled 2017-11-15: qty 5

## 2017-11-15 MED ORDER — ONDANSETRON HCL 4 MG/2ML IJ SOLN
INTRAMUSCULAR | Status: AC
  Filled 2017-11-15: qty 6

## 2017-11-15 MED ORDER — SODIUM CHLORIDE 0.9 % IV SOLN: 250.0000 mL | INTRAVENOUS | Status: DC

## 2017-11-15 MED ORDER — PROMETHAZINE HCL 25 MG PO TABS: 25.0000 mg | ORAL_TABLET | Freq: Three times a day (TID) | ORAL | Status: DC | PRN

## 2017-11-15 MED ORDER — LIDOCAINE 2% (20 MG/ML) 5 ML SYRINGE
INTRAMUSCULAR | Status: DC | PRN
  Administered 2017-11-15: 80 mg via INTRAVENOUS

## 2017-11-15 MED ORDER — PROPOFOL 10 MG/ML IV BOLUS
INTRAVENOUS | Status: DC | PRN
  Administered 2017-11-15: 120 mg via INTRAVENOUS

## 2017-11-15 MED ORDER — ACETAMINOPHEN 500 MG PO TABS
1000.0000 mg | ORAL_TABLET | Freq: Four times a day (QID) | ORAL | Status: AC
  Administered 2017-11-15 – 2017-11-16 (×4): 1000 mg via ORAL
  Filled 2017-11-15 (×4): qty 2

## 2017-11-15 MED ORDER — ONDANSETRON HCL 4 MG/2ML IJ SOLN: 4.0000 mg | Freq: Four times a day (QID) | INTRAMUSCULAR | Status: DC | PRN

## 2017-11-15 MED ORDER — ONDANSETRON HCL 4 MG/2ML IJ SOLN
INTRAMUSCULAR | Status: DC | PRN
  Administered 2017-11-15: 4 mg via INTRAVENOUS

## 2017-11-15 MED ORDER — BUPIVACAINE-EPINEPHRINE (PF) 0.5% -1:200000 IJ SOLN
INTRAMUSCULAR | Status: DC | PRN
  Administered 2017-11-15: 10 mL via PERINEURAL

## 2017-11-15 MED ORDER — PHENOL 1.4 % MT LIQD: 1.0000 | OROMUCOSAL | Status: DC | PRN

## 2017-11-15 MED ORDER — CANAGLIFLOZIN 100 MG PO TABS
100.0000 mg | ORAL_TABLET | Freq: Every day | ORAL | Status: DC
Start: 2017-11-16 — End: 2017-11-17
  Administered 2017-11-16 – 2017-11-17 (×2): 100 mg via ORAL
  Filled 2017-11-15 (×2): qty 1

## 2017-11-15 MED ORDER — VANCOMYCIN HCL 1 G IV SOLR
INTRAVENOUS | Status: DC | PRN
  Administered 2017-11-15: 1000 mg

## 2017-11-15 MED ORDER — VANCOMYCIN HCL IN DEXTROSE 1-5 GM/200ML-% IV SOLN
1000.0000 mg | INTRAVENOUS | Status: AC
  Administered 2017-11-15: 1000 mg via INTRAVENOUS

## 2017-11-15 MED ORDER — HEMOSTATIC AGENTS (NO CHARGE) OPTIME
TOPICAL | Status: DC | PRN
  Administered 2017-11-15: 1 via TOPICAL

## 2017-11-15 MED ORDER — DOCUSATE SODIUM 100 MG PO CAPS
100.0000 mg | ORAL_CAPSULE | Freq: Two times a day (BID) | ORAL | Status: DC
  Administered 2017-11-15 – 2017-11-17 (×4): 100 mg via ORAL
  Filled 2017-11-15 (×4): qty 1

## 2017-11-15 MED ORDER — THROMBIN (RECOMBINANT) 5000 UNITS EX SOLR
CUTANEOUS | Status: AC
  Filled 2017-11-15: qty 5000

## 2017-11-15 MED ORDER — BACITRACIN ZINC 500 UNIT/GM EX OINT
TOPICAL_OINTMENT | CUTANEOUS | Status: AC
  Filled 2017-11-15: qty 28.35

## 2017-11-15 MED ORDER — PHENYLEPHRINE HCL 10 MG/ML IJ SOLN
INTRAMUSCULAR | Status: DC | PRN
  Administered 2017-11-15: 40 ug via INTRAVENOUS
  Administered 2017-11-15 (×3): 80 ug via INTRAVENOUS
  Administered 2017-11-15: 40 ug via INTRAVENOUS

## 2017-11-15 MED ORDER — ACETAMINOPHEN 650 MG RE SUPP: 650.0000 mg | RECTAL | Status: DC | PRN

## 2017-11-15 MED ORDER — OXYCODONE HCL 5 MG PO TABS: 5.0000 mg | ORAL_TABLET | ORAL | Status: DC | PRN

## 2017-11-15 MED ORDER — GLYCOPYRROLATE 0.2 MG/ML IV SOSY
PREFILLED_SYRINGE | INTRAVENOUS | Status: DC | PRN
  Administered 2017-11-15: .1 mg via INTRAVENOUS

## 2017-11-15 MED ORDER — FENTANYL CITRATE (PF) 250 MCG/5ML IJ SOLN
INTRAMUSCULAR | Status: DC | PRN
  Administered 2017-11-15: 25 ug via INTRAVENOUS
  Administered 2017-11-15 (×3): 50 ug via INTRAVENOUS
  Administered 2017-11-15: 25 ug via INTRAVENOUS
  Administered 2017-11-15: 50 ug via INTRAVENOUS

## 2017-11-15 MED ORDER — PROPOFOL 10 MG/ML IV BOLUS
INTRAVENOUS | Status: AC
  Filled 2017-11-15: qty 20

## 2017-11-15 MED ORDER — ROCURONIUM BROMIDE 10 MG/ML (PF) SYRINGE
PREFILLED_SYRINGE | INTRAVENOUS | Status: DC | PRN
  Administered 2017-11-15: 10 mg via INTRAVENOUS
  Administered 2017-11-15: 70 mg via INTRAVENOUS

## 2017-11-15 MED ORDER — INSULIN ASPART 100 UNIT/ML ~~LOC~~ SOLN
0.0000 [IU] | Freq: Three times a day (TID) | SUBCUTANEOUS | Status: DC
  Administered 2017-11-16: 20 [IU] via SUBCUTANEOUS
  Administered 2017-11-16: 15 [IU] via SUBCUTANEOUS
  Administered 2017-11-16: 4 [IU] via SUBCUTANEOUS
  Administered 2017-11-17: 11 [IU] via SUBCUTANEOUS

## 2017-11-15 MED ORDER — OXYCODONE HCL 5 MG PO TABS
10.0000 mg | ORAL_TABLET | ORAL | Status: DC | PRN
  Administered 2017-11-15 – 2017-11-17 (×11): 10 mg via ORAL
  Filled 2017-11-15 (×11): qty 2

## 2017-11-15 MED ORDER — ACETAMINOPHEN 325 MG PO TABS
650.0000 mg | ORAL_TABLET | ORAL | Status: DC | PRN
  Administered 2017-11-17 (×2): 650 mg via ORAL
  Filled 2017-11-15 (×2): qty 2

## 2017-11-15 MED ORDER — BISACODYL 10 MG RE SUPP: 10.0000 mg | Freq: Every day | RECTAL | Status: DC | PRN

## 2017-11-15 MED ORDER — ACETAMINOPHEN 10 MG/ML IV SOLN
INTRAVENOUS | Status: AC
  Filled 2017-11-15: qty 100

## 2017-11-15 MED ORDER — DULOXETINE HCL 30 MG PO CPEP
120.0000 mg | ORAL_CAPSULE | Freq: Every day | ORAL | Status: DC
  Administered 2017-11-16: 120 mg via ORAL
  Filled 2017-11-15: qty 4

## 2017-11-15 MED ORDER — 0.9 % SODIUM CHLORIDE (POUR BTL) OPTIME
TOPICAL | Status: DC | PRN
  Administered 2017-11-15: 1000 mL

## 2017-11-15 MED ORDER — CHLORHEXIDINE GLUCONATE CLOTH 2 % EX PADS: 6.0000 | MEDICATED_PAD | Freq: Once | CUTANEOUS | Status: DC

## 2017-11-15 MED ORDER — IRBESARTAN 300 MG PO TABS
300.0000 mg | ORAL_TABLET | Freq: Every day | ORAL | Status: DC
  Filled 2017-11-15 (×3): qty 1

## 2017-11-15 MED ORDER — SODIUM CHLORIDE 0.9 % IV SOLN
INTRAVENOUS | Status: DC | PRN
  Administered 2017-11-15: 13:00:00

## 2017-11-15 MED ORDER — SODIUM CHLORIDE 0.9% FLUSH
3.0000 mL | Freq: Two times a day (BID) | INTRAVENOUS | Status: DC
  Administered 2017-11-16: 3 mL via INTRAVENOUS

## 2017-11-15 MED ORDER — ONDANSETRON HCL 4 MG PO TABS: 4.0000 mg | ORAL_TABLET | Freq: Four times a day (QID) | ORAL | Status: DC | PRN

## 2017-11-15 MED ORDER — FLUTICASONE PROPIONATE 50 MCG/ACT NA SUSP
2.0000 | Freq: Every day | NASAL | Status: DC | PRN
Start: 2017-11-15 — End: 2017-11-17

## 2017-11-15 MED ORDER — MIDAZOLAM HCL 2 MG/2ML IJ SOLN
INTRAMUSCULAR | Status: AC
  Filled 2017-11-15: qty 2

## 2017-11-15 MED ORDER — TEMAZEPAM 15 MG PO CAPS
15.0000 mg | ORAL_CAPSULE | Freq: Every evening | ORAL | Status: DC | PRN
  Administered 2017-11-15 – 2017-11-16 (×2): 15 mg via ORAL
  Filled 2017-11-15 (×2): qty 1

## 2017-11-15 MED ORDER — FENTANYL CITRATE (PF) 100 MCG/2ML IJ SOLN: 25.0000 ug | INTRAMUSCULAR | Status: DC | PRN

## 2017-11-15 MED ORDER — LACTATED RINGERS IV SOLN
INTRAVENOUS | Status: DC
  Administered 2017-11-15 (×3): via INTRAVENOUS

## 2017-11-15 MED ORDER — MENTHOL 3 MG MT LOZG: 1.0000 | LOZENGE | OROMUCOSAL | Status: DC | PRN

## 2017-11-15 MED ORDER — DIPHENHYDRAMINE HCL 50 MG/ML IJ SOLN
INTRAMUSCULAR | Status: DC | PRN
  Administered 2017-11-15: 12.5 mg via INTRAVENOUS

## 2017-11-15 MED ORDER — BACITRACIN ZINC 500 UNIT/GM EX OINT
TOPICAL_OINTMENT | CUTANEOUS | Status: DC | PRN
  Administered 2017-11-15: 1 via TOPICAL

## 2017-11-15 MED ORDER — BUPIVACAINE LIPOSOME 1.3 % IJ SUSP
20.0000 mL | INTRAMUSCULAR | Status: AC
  Administered 2017-11-15: 20 mL
  Filled 2017-11-15: qty 20

## 2017-11-15 MED ORDER — DEXTROSE 50 % IV SOLN
50.0000 mL | Freq: Once | INTRAVENOUS | Status: AC
  Administered 2017-11-15: 50 mL via INTRAVENOUS

## 2017-11-15 MED ORDER — SCOPOLAMINE 1 MG/3DAYS TD PT72
MEDICATED_PATCH | TRANSDERMAL | Status: AC
  Filled 2017-11-15: qty 1

## 2017-11-15 MED ORDER — THROMBIN (RECOMBINANT) 5000 UNITS EX SOLR
CUTANEOUS | Status: DC | PRN
  Administered 2017-11-15: 5000 [IU] via TOPICAL

## 2017-11-15 MED ORDER — VANCOMYCIN HCL 1000 MG IV SOLR
INTRAVENOUS | Status: AC
  Filled 2017-11-15: qty 1000

## 2017-11-15 MED ORDER — ACETAMINOPHEN 10 MG/ML IV SOLN
INTRAVENOUS | Status: DC | PRN
  Administered 2017-11-15: 1000 mg via INTRAVENOUS

## 2017-11-15 MED ORDER — FUROSEMIDE 20 MG PO TABS
20.0000 mg | ORAL_TABLET | Freq: Every day | ORAL | Status: DC | PRN
Start: 2017-11-15 — End: 2017-11-17

## 2017-11-15 MED ORDER — THROMBIN 20000 UNITS EX SOLR
CUTANEOUS | Status: AC
  Filled 2017-11-15: qty 20000

## 2017-11-15 MED ORDER — INSULIN ASPART 100 UNIT/ML ~~LOC~~ SOLN: 0.0000 [IU] | Freq: Every day | SUBCUTANEOUS | Status: DC

## 2017-11-15 MED ORDER — DEXTROSE 50 % IV SOLN
INTRAVENOUS | Status: AC
  Filled 2017-11-15: qty 50

## 2017-11-15 MED ORDER — INSULIN ASPART 100 UNIT/ML ~~LOC~~ SOLN: 0.0000 [IU] | SUBCUTANEOUS | Status: DC

## 2017-11-15 MED ORDER — PHENYLEPHRINE HCL 10 MG/ML IJ SOLN
INTRAVENOUS | Status: DC | PRN
  Administered 2017-11-15: 25 ug/min via INTRAVENOUS

## 2017-11-15 MED ORDER — BUPIVACAINE-EPINEPHRINE (PF) 0.5% -1:200000 IJ SOLN
INTRAMUSCULAR | Status: AC
  Filled 2017-11-15: qty 30

## 2017-11-15 MED ORDER — SODIUM CHLORIDE 0.9% FLUSH
3.0000 mL | INTRAVENOUS | Status: DC | PRN
Start: 2017-11-15 — End: 2017-11-17

## 2017-11-15 MED ORDER — CYCLOBENZAPRINE HCL 10 MG PO TABS
10.0000 mg | ORAL_TABLET | Freq: Three times a day (TID) | ORAL | Status: DC | PRN
  Administered 2017-11-15 – 2017-11-16 (×3): 10 mg via ORAL
  Filled 2017-11-15 (×3): qty 1

## 2017-11-15 MED ORDER — SCOPOLAMINE 1 MG/3DAYS TD PT72
1.0000 | MEDICATED_PATCH | TRANSDERMAL | Status: DC
  Administered 2017-11-15: 1.5 mg via TRANSDERMAL

## 2017-11-15 MED ORDER — AMLODIPINE BESYLATE 5 MG PO TABS
5.0000 mg | ORAL_TABLET | Freq: Every day | ORAL | Status: DC
  Administered 2017-11-16: 5 mg via ORAL
  Filled 2017-11-15: qty 1

## 2017-11-15 MED ORDER — DEXAMETHASONE SODIUM PHOSPHATE 10 MG/ML IJ SOLN
INTRAMUSCULAR | Status: DC | PRN
  Administered 2017-11-15: 10 mg via INTRAVENOUS

## 2017-11-15 MED ORDER — SUGAMMADEX SODIUM 200 MG/2ML IV SOLN
INTRAVENOUS | Status: DC | PRN
  Administered 2017-11-15: 300 mg via INTRAVENOUS

## 2017-11-15 SURGICAL SUPPLY — 67 items
BAG DECANTER FOR FLEXI CONT (MISCELLANEOUS) ×2 IMPLANT
BENZOIN TINCTURE PRP APPL 2/3 (GAUZE/BANDAGES/DRESSINGS) ×2 IMPLANT
BLADE CLIPPER SURG (BLADE) IMPLANT
BUR MATCHSTICK NEURO 3.0 LAGG (BURR) ×2 IMPLANT
BUR PRECISION FLUTE 6.0 (BURR) ×4 IMPLANT
CANISTER SUCT 3000ML PPV (MISCELLANEOUS) ×2 IMPLANT
CARTRIDGE OIL MAESTRO DRILL (MISCELLANEOUS) ×1 IMPLANT
CONT SPEC 4OZ CLIKSEAL STRL BL (MISCELLANEOUS) ×2 IMPLANT
COVER BACK TABLE 60X90IN (DRAPES) ×4 IMPLANT
DECANTER SPIKE VIAL GLASS SM (MISCELLANEOUS) ×2 IMPLANT
DIFFUSER DRILL AIR PNEUMATIC (MISCELLANEOUS) ×2 IMPLANT
DRAPE C-ARM 42X72 X-RAY (DRAPES) ×4 IMPLANT
DRAPE HALF SHEET 40X57 (DRAPES) ×2 IMPLANT
DRAPE LAPAROTOMY 100X72X124 (DRAPES) ×2 IMPLANT
DRAPE SURG 17X23 STRL (DRAPES) ×8 IMPLANT
DRSG OPSITE POSTOP 4X6 (GAUZE/BANDAGES/DRESSINGS) ×2 IMPLANT
DRSG OPSITE POSTOP 4X8 (GAUZE/BANDAGES/DRESSINGS) ×2 IMPLANT
ELECT BLADE 4.0 EZ CLEAN MEGAD (MISCELLANEOUS) ×2
ELECT REM PT RETURN 9FT ADLT (ELECTROSURGICAL) ×2
ELECTRODE BLDE 4.0 EZ CLN MEGD (MISCELLANEOUS) ×1 IMPLANT
ELECTRODE REM PT RTRN 9FT ADLT (ELECTROSURGICAL) ×1 IMPLANT
EVACUATOR 1/8 PVC DRAIN (DRAIN) IMPLANT
GAUZE SPONGE 4X4 12PLY STRL (GAUZE/BANDAGES/DRESSINGS) ×2 IMPLANT
GAUZE SPONGE 4X4 16PLY XRAY LF (GAUZE/BANDAGES/DRESSINGS) ×2 IMPLANT
GLOVE BIO SURGEON STRL SZ7 (GLOVE) ×2 IMPLANT
GLOVE BIO SURGEON STRL SZ8 (GLOVE) ×6 IMPLANT
GLOVE BIO SURGEON STRL SZ8.5 (GLOVE) ×4 IMPLANT
GLOVE BIOGEL PI IND STRL 6.5 (GLOVE) ×4 IMPLANT
GLOVE BIOGEL PI INDICATOR 6.5 (GLOVE) ×4
GLOVE EXAM NITRILE LRG STRL (GLOVE) IMPLANT
GLOVE EXAM NITRILE XL STR (GLOVE) IMPLANT
GLOVE EXAM NITRILE XS STR PU (GLOVE) IMPLANT
GLOVE SURG SS PI 6.0 STRL IVOR (GLOVE) ×4 IMPLANT
GLOVE SURG SS PI 8.0 STRL IVOR (GLOVE) ×4 IMPLANT
GOWN STRL REUS W/ TWL LRG LVL3 (GOWN DISPOSABLE) ×2 IMPLANT
GOWN STRL REUS W/ TWL XL LVL3 (GOWN DISPOSABLE) ×2 IMPLANT
GOWN STRL REUS W/TWL 2XL LVL3 (GOWN DISPOSABLE) IMPLANT
GOWN STRL REUS W/TWL LRG LVL3 (GOWN DISPOSABLE) ×2
GOWN STRL REUS W/TWL XL LVL3 (GOWN DISPOSABLE) ×2
HEMOSTAT POWDER KIT SURGIFOAM (HEMOSTASIS) ×2 IMPLANT
KIT BASIN OR (CUSTOM PROCEDURE TRAY) ×2 IMPLANT
KIT TURNOVER KIT B (KITS) ×2 IMPLANT
MILL MEDIUM DISP (BLADE) IMPLANT
NEEDLE HYPO 21X1.5 SAFETY (NEEDLE) ×2 IMPLANT
NEEDLE HYPO 22GX1.5 SAFETY (NEEDLE) ×2 IMPLANT
NS IRRIG 1000ML POUR BTL (IV SOLUTION) ×2 IMPLANT
OIL CARTRIDGE MAESTRO DRILL (MISCELLANEOUS) ×2
PACK LAMINECTOMY NEURO (CUSTOM PROCEDURE TRAY) ×2 IMPLANT
PAD ARMBOARD 7.5X6 YLW CONV (MISCELLANEOUS) ×6 IMPLANT
PATTIES SURGICAL .5 X1 (DISPOSABLE) IMPLANT
ROD EXPEDIUM PRE BENT 5.5X75 (Rod) ×4 IMPLANT
SCREW SET SINGLE INNER (Screw) ×12 IMPLANT
SCREW VIPER 7X50MM (Screw) ×4 IMPLANT
SPACER ALTERA 10X31 8-12MM-8 (Spacer) ×2 IMPLANT
SPONGE LAP 4X18 X RAY DECT (DISPOSABLE) IMPLANT
SPONGE NEURO XRAY DETECT 1X3 (DISPOSABLE) IMPLANT
SPONGE SURGIFOAM ABS GEL 100 (HEMOSTASIS) IMPLANT
STRIP BIOACTIVE 20CC 25X100X8 (Miscellaneous) ×2 IMPLANT
STRIP CLOSURE SKIN 1/2X4 (GAUZE/BANDAGES/DRESSINGS) ×2 IMPLANT
SUT VIC AB 1 CT1 18XBRD ANBCTR (SUTURE) ×2 IMPLANT
SUT VIC AB 1 CT1 8-18 (SUTURE) ×2
SUT VIC AB 2-0 CP2 18 (SUTURE) ×4 IMPLANT
SYR 20CC LL (SYRINGE) ×2 IMPLANT
TOWEL GREEN STERILE (TOWEL DISPOSABLE) ×2 IMPLANT
TOWEL GREEN STERILE FF (TOWEL DISPOSABLE) ×2 IMPLANT
TRAY FOLEY W/METER SILVER 16FR (SET/KITS/TRAYS/PACK) ×2 IMPLANT
WATER STERILE IRR 1000ML POUR (IV SOLUTION) ×2 IMPLANT

## 2017-11-15 NOTE — Progress Notes (Signed)
Subjective: The patient is somnolent but easily arousable.  She is in no apparent distress.  He was given D50 for a glucose of 28.  Objective: Vital signs in last 24 hours: Temp:  [98.3 F (36.8 C)-98.6 F (37 C)] 98.3 F (36.8 C) (04/10 1622) Pulse Rate:  [99-107] 107 (04/10 1637) Resp:  [18-24] 24 (04/10 1637) BP: (137-188)/(61-74) 146/61 (04/10 1637) SpO2:  [95 %-100 %] 100 % (04/10 1637) Weight:  [92.2 kg (203 lb 3 oz)] 92.2 kg (203 lb 3 oz) (04/10 1214) Estimated body mass index is 35.99 kg/m as calculated from the following:   Height as of this encounter: 5\' 3"  (1.6 m).   Weight as of this encounter: 92.2 kg (203 lb 3 oz).   Intake/Output from previous day: No intake/output data recorded. Intake/Output this shift: Total I/O In: 1000 [I.V.:1000] Out: 600 [Urine:400; Blood:200]  Physical exam patient is somnolent but easily arousable.  She is moving all 4 extremities well.  Lab Results: No results for input(s): WBC, HGB, HCT, PLT in the last 72 hours. BMET No results for input(s): NA, K, CL, CO2, GLUCOSE, BUN, CREATININE, CALCIUM in the last 72 hours.  Studies/Results: Dg Lumbar Spine 2-3 Views  Result Date: 11/15/2017 CLINICAL DATA:  POSTERIOR LUMBAR INTERBODY FUSION, POSTERIOR INSTRUMENTATION, POSSIBLE INTERBODY PROSTHESIS LUMBAR TWO- LUMBAR THREE; EXPLORE LUMBAR FUSION. Fluoro time: 17 seconds. EXAM: DG C-ARM 61-120 MIN; LUMBAR SPINE - 2-3 VIEW COMPARISON:  MRI, 09/24/2017.  Radiographs, 08/22/2017. FINDINGS: Two submitted operative images show placement of new pedicle screws at L2 with a metallic intervertebral spacer. Pedicle screws and spacer appear well-positioned. The pedicle screws at L3 and L4 are unchanged from prior exam. IMPRESSION: Operative imaging provided or extension of the posterior lumbar spine fusion to the L2 vertebra. Electronically Signed   By: Lajean Manes M.D.   On: 11/15/2017 16:05   Dg C-arm 1-60 Min  Result Date: 11/15/2017 CLINICAL DATA:   POSTERIOR LUMBAR INTERBODY FUSION, POSTERIOR INSTRUMENTATION, POSSIBLE INTERBODY PROSTHESIS LUMBAR TWO- LUMBAR THREE; EXPLORE LUMBAR FUSION. Fluoro time: 17 seconds. EXAM: DG C-ARM 61-120 MIN; LUMBAR SPINE - 2-3 VIEW COMPARISON:  MRI, 09/24/2017.  Radiographs, 08/22/2017. FINDINGS: Two submitted operative images show placement of new pedicle screws at L2 with a metallic intervertebral spacer. Pedicle screws and spacer appear well-positioned. The pedicle screws at L3 and L4 are unchanged from prior exam. IMPRESSION: Operative imaging provided or extension of the posterior lumbar spine fusion to the L2 vertebra. Electronically Signed   By: Lajean Manes M.D.   On: 11/15/2017 16:05    Assessment/Plan: The patient is doing well.  I spoke with her husband.  Diabetes mellitus, hypoglycemia: I will put her on the sliding scale insulin.  LOS: 0 days     [name] 11/15/2017, 4:47 PM

## 2017-11-15 NOTE — Progress Notes (Addendum)
Hypoglycemic Event  CBG: 28  Treatment: D50, 50 mL  Symptoms: none  Follow-up CBG: Time:1640 CBG Result: 171  Possible Reasons for Event: NPO day of surgery  Comments/MD notified: Dr. Fransisco Beau made aware    Leonie Green

## 2017-11-15 NOTE — Anesthesia Preprocedure Evaluation (Addendum)
Anesthesia Evaluation  Patient identified by MRN, date of birth, ID band Patient awake    Reviewed: Allergy & Precautions, H&P , NPO status , Patient's Chart, lab work & pertinent test results  History of Anesthesia Complications (+) PONV  Airway Mallampati: II  TM Distance: >3 FB Neck ROM: Full    Dental no notable dental hx. (+) Teeth Intact, Dental Advisory Given   Pulmonary neg pulmonary ROS,    Pulmonary exam normal breath sounds clear to auscultation       Cardiovascular Exercise Tolerance: Good hypertension, Pt. on medications + DVT   Rhythm:Regular Rate:Normal     Neuro/Psych Depression Insomnia, RLS    GI/Hepatic Neg liver ROS, hiatal hernia, GERD  Medicated and Controlled,  Endo/Other  diabetes, Type 2, Insulin DependentHypothyroidism Obesity  Renal/GU Nephrolithiasis  negative genitourinary   Musculoskeletal  (+) Arthritis , Osteoarthritis,  Fibromyalgia -, narcotic dependent  Abdominal (+) + obese,   Peds  Hematology  (+) anemia ,   Anesthesia Other Findings   Reproductive/Obstetrics negative OB ROS                            Anesthesia Physical  Anesthesia Plan  ASA: III  Anesthesia Plan: General   Post-op Pain Management:    Induction: Intravenous  PONV Risk Score and Plan: 4 or greater and Ondansetron, Dexamethasone, Midazolam, Scopolamine patch - Pre-op, Propofol infusion and Treatment may vary due to age or medical condition  Airway Management Planned: Oral ETT  Additional Equipment: None  Intra-op Plan:   Post-operative Plan: Extubation in OR  Informed Consent: I have reviewed the patients History and Physical, chart, labs and discussed the procedure including the risks, benefits and alternatives for the proposed anesthesia with the patient or authorized representative who has indicated his/her understanding and acceptance.   Dental advisory  given  Plan Discussed with: CRNA and Anesthesiologist  Anesthesia Plan Comments:         Anesthesia Quick Evaluation

## 2017-11-15 NOTE — Anesthesia Postprocedure Evaluation (Signed)
Anesthesia Post Note  Patient: [name]  Procedure(s) Performed: POSTERIOR LUMBAR INTERBODY FUSION, POSTERIOR INSTRUMENTATION, POSSIBLE INTERBODY PROSTHESIS LUMBAR TWO- LUMBAR THREE; EXPLORE LUMBAR FUSION (N/A Back)     Patient location during evaluation: PACU Anesthesia Type: General Level of consciousness: awake and alert Pain management: pain level controlled Vital Signs Assessment: post-procedure vital signs reviewed and stable Respiratory status: spontaneous breathing, nonlabored ventilation, respiratory function stable and patient connected to nasal cannula oxygen Cardiovascular status: blood pressure returned to baseline and stable Postop Assessment: no apparent nausea or vomiting Anesthetic complications: no    Last Vitals:  Vitals:   11/15/17 1707 11/15/17 1723  BP: 140/71 (!) 154/71  Pulse: (!) 103 (!) 103  Resp: 17 17  Temp:    SpO2: 92% 99%    Last Pain:  Vitals:   11/15/17 1622  TempSrc:   PainSc: Asleep    LLE Motor Response: Purposeful movement;Responds to commands (11/15/17 1723) LLE Sensation: Full sensation (11/15/17 1723) RLE Motor Response: Purposeful movement;Responds to commands (11/15/17 1723) RLE Sensation: Numbness(states feels same as pre-op; will continue to monitor) (11/15/17 1723)      Audry Pili

## 2017-11-15 NOTE — Op Note (Signed)
Brief history: The patient is a 67 year old white female whose had several prior lumbar fusions by another physician.  She has developed recurrent back and leg pain.  She has failed medical management.  She was worked up with a lumbar MRI which demonstrated an L2-3 spondylolisthesis, degenerative disc disease and spinal stenosis.  I discussed the various treatment options with the patient including surgery.  She has weighed the risks, benefits, and alternatives of surgery and decided to proceed with an L2-3 decompression instrumentation and fusion with exploration of her old fusion.  Preoperative diagnosis: L2-3 spondylolisthesis, degenerative disc disease, spinal stenosis compressing both the L2 and the L3 nerve roots; lumbago; lumbar radiculopathy  Postoperative diagnosis: The same  Procedure: Bilateral L2-3 laminotomy/foraminotomies to decompress the bilateral L2 and L3 nerve roots(the work required to do this was in addition to the work required to do the posterior lumbar interbody fusion because of the patient's spinal stenosis, facet arthropathy. Etc. requiring a wide decompression of the nerve roots.);  L2-3 transforaminal lumbar interbody fusion with local morselized autograft bone and Kinnex graft extender; insertion of interbody prosthesis at L2-3 (globus peek expandable interbody prosthesis); posterior segmental instrumentation from L2 to L4 with Depey titanium pedicle screws and rods; posterior lateral arthrodesis at L2-3 with local morselized autograft bone and Kinnex bone graft extender; exploration of lumbar fusion/removal of lumbar hardware  Surgeon: Dr. Earle Gell  Asst.: Dr. Sherley Bounds  Anesthesia: Gen. endotracheal  Estimated blood loss: 200 cc  Drains: None  Complications: None  Description of procedure: The patient was brought to the operating room by the anesthesia team. General endotracheal anesthesia was induced. The patient was turned to the prone position on the  Wilson frame. The patient's lumbosacral region was then prepared with Betadine scrub and Betadine solution. Sterile drapes were applied.  I then injected the area to be incised with Marcaine with epinephrine solution. I then used the scalpel to make a linear midline incision over the L2-3 and L3-4 interspace. I then used electrocautery to perform a bilateral subperiosteal dissection exposing the spinous process and lamina of L2, L3 and the old hardware at L3-4.  We then inserted the Verstrac retractor to provide exposure.  I explored the fusion by removing the caps from the old screws, removed the rods and then inspected the arthrodesis at L3-4.  It appeared solid.  I began the decompression by using the high speed drill to perform laminotomies at L2-3 bilaterally. We then used the Kerrison punches to widen the laminotomy and removed the ligamentum flavum at L2-3 bilaterally. We used the Kerrison punches to remove the medial facets at 2 3 bilaterally. We performed wide foraminotomies about the bilateral L2 and L3 nerve roots completing the decompression.  We now turned our attention to the posterior lumbar interbody fusion. I used a scalpel to incise the intervertebral disc at L2-3 bilaterally. I then performed a partial intervertebral discectomy at L2-3 bilaterally using the pituitary forceps. We prepared the vertebral endplates at U5-4 bilaterally for the fusion by removing the soft tissues with the curettes. We then used the trial spacers to pick the appropriate sized interbody prosthesis. We prefilled his prosthesis with a combination of local morselized autograft bone that we obtained during the decompression as well as Kinnex bone graft extender. We inserted the prefilled prosthesis into the interspace at L2-3, we then expanded the prosthesis. There was a good snug fit of the prosthesis in the interspace. We then filled and the remainder of the intervertebral disc space with  local morselized autograft  bone and Kinnex. This completed the posterior lumbar interbody arthrodesis.  We now turned attention to the instrumentation. Under fluoroscopic guidance we cannulated the bilateral L2 pedicles with the bone probe. We then removed the bone probe. We then tapped the pedicle with a 6.0 millimeter tap. We then removed the tap. We probed inside the tapped pedicle with a ball probe to rule out cortical breaches. We then inserted a 7.0 x 50 millimeter pedicle screw into the L2 pedicles bilaterally under fluoroscopic guidance. We then palpated along the medial aspect of the pedicles to rule out cortical breaches. There were none. The nerve roots were not injured. We then connected the unilateral pedicle screws from L2-L4 bilaterally with a lordotic rod. We compressed the construct and secured the rod in place with the caps. We then tightened the caps appropriately. This completed the instrumentation from L2-L4.  We now turned our attention to the posterior lateral arthrodesis at L2-3 bilaterally. We used the high-speed drill to decorticate the remainder of the facets, pars, transverse process at L2-3 bilaterally. We then applied a combination of local morselized autograft bone and Kinnex bone graft extender over these decorticated posterior lateral structures. This completed the posterior lateral arthrodesis.  We then obtained hemostasis using bipolar electrocautery. We irrigated the wound out with bacitracin solution. We inspected the thecal sac and nerve roots and noted they were well decompressed. We then removed the retractor. We placed vancomycin powder in the wound. We reapproximated patient's thoracolumbar fascia with interrupted #1 Vicryl suture. We reapproximated patient's subcutaneous tissue with interrupted 2-0 Vicryl suture. The reapproximated patient's skin with Steri-Strips and benzoin. The wound was then coated with bacitracin ointment. A sterile dressing was applied. The drapes were removed. The  patient was subsequently returned to the supine position where they were extubated by the anesthesia team. He was then transported to the post anesthesia care unit in stable condition. All sponge instrument and needle counts were reportedly correct at the end of this case.

## 2017-11-15 NOTE — Anesthesia Procedure Notes (Signed)
Procedure Name: Intubation Date/Time: 11/15/2017 12:54 PM Performed by: Orlie Dakin, CRNA Pre-anesthesia Checklist: Patient identified, Emergency Drugs available, Suction available, Patient being monitored and Timeout performed Patient Re-evaluated:Patient Re-evaluated prior to induction Oxygen Delivery Method: Circle system utilized Preoxygenation: Pre-oxygenation with 100% oxygen Induction Type: IV induction Ventilation: Mask ventilation without difficulty Laryngoscope Size: Glidescope and 3 Grade View: Grade I Tube type: Oral Tube size: 7.0 mm Number of attempts: 1 Airway Equipment and Method: Stylet and Video-laryngoscopy Placement Confirmation: ETT inserted through vocal cords under direct vision,  positive ETCO2 and breath sounds checked- equal and bilateral Secured at: 22 cm Tube secured with: Tape Dental Injury: Teeth and Oropharynx as per pre-operative assessment  Comments: 4x4s bite block used.  Noted chipped upper front 2 teeth pre-op.

## 2017-11-15 NOTE — Progress Notes (Signed)
Pharmacy Antibiotic Note  [name] is a 67 y.o. female admitted on 11/15/2017 with for lumbar surgery.  Pharmacy has been consulted for vancomycin x 1 dose 12 hours post-op (no drain). Pre-op vancomycin 1g dose given at 1202 today. SCr 1.06 on 4/2.  Plan: Vancomycin 750mg  IV x 1 at midnight tonight (12hrs after post-op dose) Monitor renal function Pharmacy will s/o consult  Height: 5\' 3"  (160 cm) Weight: 203 lb 3 oz (92.2 kg) IBW/kg (Calculated) : 52.4  Temp (24hrs), Avg:98.3 F (36.8 C), Min:98 F (36.7 C), Max:98.6 F (37 C)  No results for input(s): WBC, CREATININE, LATICACIDVEN, VANCOTROUGH, VANCOPEAK, VANCORANDOM, GENTTROUGH, GENTPEAK, GENTRANDOM, TOBRATROUGH, TOBRAPEAK, TOBRARND, AMIKACINPEAK, AMIKACINTROU, AMIKACIN in the last 168 hours.  Estimated Creatinine Clearance: 55.5 mL/min (A) (by C-G formula based on SCr of 1.06 mg/dL (H)).    Allergies  Allergen Reactions  . Codeine Nausea And Vomiting  . Ambien [Zolpidem Tartrate] Other (See Comments)    Up sleep walking and eating  . Crestor [Rosuvastatin] Other (See Comments)    Muscle weakness, cramps and aching all over body  . Lipitor [Atorvastatin] Itching and Other (See Comments)    Muscle weakness, cramps and aches all over body  . Morphine And Related Other (See Comments)    Flushing and feeling hot Tolerates with Diphenhydramine  . Penicillins Rash and Other (See Comments)    Caused Headaches also Has patient had a PCN reaction causing immediate rash, facial/tongue/throat swelling, SOB or lightheadedness with hypotension: No Has patient had a PCN reaction causing severe rash involving mucus membranes or skin necrosis: No Has patient had a PCN reaction that required hospitalization No Has patient had a PCN reaction occurring within the last 10 years: No If all of the above answers are "NO", then may proceed with Cephalosporin use.    . Statins Other (See Comments)    Muscle weakness, cramps and aching all  over body  . Dilaudid [Hydromorphone Hcl] Other (See Comments)    Hallucinations/ argumentative, goes beserk  . Amitriptyline Other (See Comments)    Loopy feeling  . Ceftin [Cefuroxime Axetil] Other (See Comments)    Stomach pain   . Lovaza [Omega-3-Acid Ethyl Esters] Other (See Comments)    Stomach pain   . Lunesta [Eszopiclone] Other (See Comments)    Causes dizziness  . Lyrica [Pregabalin] Nausea Only  . Metformin And Related Nausea Only    Pt denies this intolerance   Elicia Lamp, PharmD, BCPS Clinical Pharmacist 11/15/2017 6:24 PM

## 2017-11-15 NOTE — Transfer of Care (Signed)
Immediate Anesthesia Transfer of Care Note  Patient: [name]  Procedure(s) Performed: POSTERIOR LUMBAR INTERBODY FUSION, POSTERIOR INSTRUMENTATION, POSSIBLE INTERBODY PROSTHESIS LUMBAR TWO- LUMBAR THREE; EXPLORE LUMBAR FUSION (N/A Back)  Patient Location: PACU  Anesthesia Type:General  Level of Consciousness: drowsy and patient cooperative  Airway & Oxygen Therapy: Patient Spontanous Breathing and Patient connected to nasal cannula oxygen  Post-op Assessment: Report given to RN and Post -op Vital signs reviewed and unstable, Anesthesiologist notified  Post vital signs: Reviewed and stable  Last Vitals:  Vitals Value Taken Time  BP 137/65 11/15/2017  4:22 PM  Temp 36.8 C 11/15/2017  4:22 PM  Pulse 102 11/15/2017  4:33 PM  Resp 20 11/15/2017  4:33 PM  SpO2 100 % 11/15/2017  4:33 PM  Vitals shown include unvalidated device data.  Last Pain:  Vitals:   11/15/17 1207  TempSrc:   PainSc: 4       Patients Stated Pain Goal: 3 (01/77/93 9030)  Complications: No apparent anesthesia complications

## 2017-11-15 NOTE — H&P (Signed)
Subjective: The patient is a 67 year old white female who has had a previous lumbar instrumentation and fusion.  She has developed recurrent back and leg pain consistent with neurogenic claudication.  She has failed medical management and was worked up with a lumbar MRI and x-rays.  This demonstrated L2-3 spondylolisthesis spinal stenosis, etc.  I discussed the various treatment options with the patient including surgery.  She has weighed the risks, benefits, and alternatives surgery and decided to proceed with an L2-3 decompression, instrumentation and fusion.  Past Medical History:  Diagnosis Date  . Anemia    iron deficiency  . Arthritis   . Blood transfusion   . Cervical spondylosis with myelopathy and radiculopathy   . Depression   . Diabetes mellitus    Type II  . Diastolic dysfunction   . Diverticulitis   . DJD (degenerative joint disease)   . DVT (deep venous thrombosis) (HCC)    times 2 lower leg  . Endometriosis   . Family history of adverse reaction to anesthesia    mom and dad PONV  . Fibromyalgia   . GERD (gastroesophageal reflux disease)    occ  . Hiatal hernia   . History of kidney stones   . Hyperlipidemia   . Hypertension   . Hypothyroidism   . Insomnia   . Mild aortic insufficiency    by echo 04/2013  . Osteoarthritis    back and knee  . Ovarian cyst   . PONV (postoperative nausea and vomiting)   . RLS (restless legs syndrome)   . Thoracic compression fracture ([place])   . Uterine fibroid   . UTI (lower urinary tract infection)   . Villous adenoma of right colon 04/08/2016  . Vitamin D deficiency disease     Past Surgical History:  Procedure Laterality Date  . ANTERIOR CERVICAL DECOMP/DISCECTOMY FUSION N/A 03/23/2017   Procedure: ANTERIOR CERVICAL DECOMPRESSION/DISCECTOMY FUSION, INTERBODY PROSTHESIS,PLATE CERVICAL THREE- CERVICAL FOUR, CERVICAL FOUR- CERVICAL FIVE, CERVICAL FIVE- CERVICAL SIX;  Surgeon: Newman Pies, MD;  Location: [facility];  Service:  Neurosurgery;  Laterality: N/A;  . APPENDECTOMY    . BACK SURGERY  ,2005   April  2013 - [facility] fusion[facility]  . BREAST SURGERY     breast reduction  . CARDIAC CATHETERIZATION  04/2013   normal coronary arteries and normal LVF  . CARPAL TUNNEL RELEASE  06/05/2012   Procedure: CARPAL TUNNEL RELEASE;  Surgeon: Cammie Sickle., MD;  Location: [facility];  Service: Orthopedics;  Laterality: Left;  . CESAREAN SECTION     x 2  . CHOLECYSTECTOMY    . COLECTOMY  04/08/2016  . DILATION AND CURETTAGE OF UTERUS    . DORSAL COMPARTMENT RELEASE  06/05/2012   Procedure: RELEASE DORSAL COMPARTMENT (DEQUERVAIN);  Surgeon: Cammie Sickle., MD;  Location: [facility];  Service: Orthopedics;  Laterality: Left;  Excision of mixoid cyst also  . EYE SURGERY     cataracts bilateral  . JOINT REPLACEMENT     Bilateral knee  . KNEE ARTHROPLASTY  09   lft partial  . KNEE ARTHROPLASTY     rt  . LAPAROSCOPIC PARTIAL COLECTOMY N/A 04/08/2016   Procedure: LAPAROSCOPIC ASSISTED ASCENDING COLECTOMY POSSIBLE OPEN COLECTOMY;  Surgeon: Fanny Skates, MD;  Location: [facility];  Service: General;  Laterality: N/A;  . orthopedic surgeries     multiple  . REDUCTION MAMMAPLASTY Bilateral   . SHOULDER OPEN ROTATOR CUFF REPAIR     rt and lft  .  TUBAL LIGATION     btsp    Allergies  Allergen Reactions  . Codeine Nausea And Vomiting  . Ambien [Zolpidem Tartrate] Other (See Comments)    Up sleep walking and eating  . Crestor [Rosuvastatin] Other (See Comments)    Muscle weakness, cramps and aching all over body  . Lipitor [Atorvastatin] Itching and Other (See Comments)    Muscle weakness, cramps and aches all over body  . Morphine And Related Other (See Comments)    Flushing and feeling hot Tolerates with Diphenhydramine  . Penicillins Rash and Other (See Comments)    Caused Headaches also Has patient had a PCN reaction causing immediate rash, facial/tongue/throat swelling, SOB or  lightheadedness with hypotension: No Has patient had a PCN reaction causing severe rash involving mucus membranes or skin necrosis: No Has patient had a PCN reaction that required hospitalization No Has patient had a PCN reaction occurring within the last 10 years: No If all of the above answers are "NO", then may proceed with Cephalosporin use.    . Statins Other (See Comments)    Muscle weakness, cramps and aching all over body  . Dilaudid [Hydromorphone Hcl] Other (See Comments)    Hallucinations/ argumentative, goes beserk  . Amitriptyline Other (See Comments)    Loopy feeling  . Ceftin [Cefuroxime Axetil] Other (See Comments)    Stomach pain   . Lovaza [Omega-3-Acid Ethyl Esters] Other (See Comments)    Stomach pain   . Lunesta [Eszopiclone] Other (See Comments)    Causes dizziness  . Lyrica [Pregabalin] Nausea Only  . Metformin And Related Nausea Only    Pt denies this intolerance    Social History   Tobacco Use  . Smoking status: Never Smoker  . Smokeless tobacco: Never Used  Substance Use Topics  . Alcohol use: No    Family History  Problem Relation Age of Onset  . Heart attack Father        34s  . Hypothyroidism Father   . Diabetes type II Father   . Diabetes type II Mother   . Hypertension Mother   . Breast cancer Mother 64  . Hypothyroidism Brother   . Breast cancer Maternal Aunt   . Breast cancer Cousin   . Breast cancer Maternal Aunt   . Breast cancer Cousin    Prior to Admission medications   Medication Sig Start Date End Date Taking? Authorizing Provider  acetaminophen (TYLENOL) 500 MG tablet Take 1,000 mg by mouth every 8 (eight) hours as needed for mild pain or headache.   Yes [provider]  amLODipine (NORVASC) 5 MG tablet Take 5 mg by mouth daily.   Yes [provider]  aspirin 81 MG chewable tablet Chew 81 mg by mouth daily.   Yes [provider]  clobetasol (TEMOVATE) 0.05 % external solution Apply 1 application  topically See admin instructions. Apply twice daily to scalp and ear as needed for itching/rash (do not apply to face/groin/underarms) 07/13/16  Yes [provider]  Cyanocobalamin (B-12) 2500 MCG TABS Take 2,500 mcg by mouth daily.   Yes [provider]  cyclobenzaprine (FLEXERIL) 10 MG tablet Take 1 tablet (10 mg total) by mouth 3 (three) times daily as needed for muscle spasms. 03/24/17  Yes Costella, Vista Mink, PA-C  doxycycline (VIBRA-TABS) 100 MG tablet Take 100 mg by mouth 2 (two) times daily as needed. For scalp/skin breakdowns. 10/10/17  Yes [provider]  DULoxetine (CYMBALTA) 60 MG capsule Take 120 mg by  mouth daily.    Yes [provider]  furosemide (LASIX) 20 MG tablet Take 20 mg by mouth daily as needed for fluid or edema.    Yes [provider]  HYDROcodone-acetaminophen (NORCO/VICODIN) 5-325 MG tablet Take 1-2 tablets by mouth every 6 (six) hours as needed for moderate pain. 03/24/17  Yes Costella, Vista Mink, PA-C  JARDIANCE 10 MG TABS tablet Take 10 mg by mouth daily. 07/18/17  Yes [provider]  levothyroxine (SYNTHROID, LEVOTHROID) 150 MCG tablet Take 150 mcg by mouth daily before breakfast. 10/10/17  Yes [provider]  NOVOLIN 70/30 RELION (70-30) 100 UNIT/ML injection Inject 50 Units into the skin 2 (two) times daily. 07/12/17  Yes [provider]  nystatin (MYCOSTATIN) 100000 UNIT/ML suspension Use as directed 5 mLs in the mouth or throat 4 (four) times daily as needed (for tongue burning/mouth sores.).  08/22/17  Yes [provider]  olmesartan (BENICAR) 40 MG tablet Take 40 mg by mouth daily. 08/22/17  Yes [provider]  pantoprazole (PROTONIX) 40 MG tablet Take 40 mg by mouth 2 (two) times daily.   Yes [provider]  Probiotic Product (PROBIOTIC PO) Take 1 capsule by mouth daily.   Yes [provider]  promethazine (PHENERGAN) 25 MG tablet Take 25 mg by mouth every 8  (eight) hours as needed for nausea or vomiting.   Yes [provider]  temazepam (RESTORIL) 15 MG capsule Take 30 mg by mouth at bedtime. 10/10/17  Yes [provider]  fluticasone (FLONASE) 50 MCG/ACT nasal spray Place 2 sprays into both nostrils daily as needed for allergies or rhinitis.    [provider]     Review of Systems  Positive ROS: As above  All other systems have been reviewed and were otherwise negative with the exception of those mentioned in the HPI and as above.  Objective: Vital signs in last 24 hours: Temp:  [98.6 F (37 C)] 98.6 F (37 C) (04/10 1144) Pulse Rate:  [99] 99 (04/10 1144) Resp:  [18] 18 (04/10 1144) BP: (170-188)/(72-74) 170/72 (04/10 1215) SpO2:  [95 %] 95 % (04/10 1144) Weight:  [92.2 kg (203 lb 3 oz)] 92.2 kg (203 lb 3 oz) (04/10 1214) Estimated body mass index is 35.99 kg/m as calculated from the following:   Height as of this encounter: 5\' 3"  (1.6 m).   Weight as of this encounter: 92.2 kg (203 lb 3 oz).   General Appearance: Alert Head: Normocephalic, without obvious abnormality, atraumatic Eyes: PERRL, conjunctiva/corneas clear, EOM's intact,    Ears: Normal  Throat: Normal  Neck: Supple, her cervical incision is well-healed. Back: Her lumbar incision is well-healed. Lungs: Clear to auscultation bilaterally, respirations unlabored Heart: Regular rate and rhythm, no murmur, rub or gallop Abdomen: Soft, non-tender Extremities: Extremities normal, atraumatic, no cyanosis or edema Skin: unremarkable  NEUROLOGIC:   Mental status: alert and oriented,Motor Exam - grossly normal Sensory Exam - grossly normal Reflexes:  Coordination - grossly normal Gait - grossly normal Balance - grossly normal Cranial Nerves: I: smell Not tested  II: visual acuity  OS: Normal  OD: Normal   II: visual fields Full to confrontation  II: pupils Equal, round, reactive to light  III,VII: ptosis None  III,IV,VI: extraocular  muscles  Full ROM  V: mastication Normal  V: facial light touch sensation  Normal  V,VII: corneal reflex  Present  VII: facial muscle function - upper  Normal  VII: facial muscle function - lower Normal  VIII: hearing Not tested  IX: soft palate elevation  Normal  IX,X: gag reflex Present  XI: trapezius strength  5/5  XI: sternocleidomastoid strength 5/5  XI: neck flexion strength  5/5  XII: tongue strength  Normal    Data Review Lab Results  Component Value Date   WBC 8.2 11/07/2017   HGB 9.2 (L) 11/07/2017   HCT 33.2 (L) 11/07/2017   MCV 65.6 (L) 11/07/2017   PLT 387 11/07/2017   Lab Results  Component Value Date   NA 136 11/07/2017   K 3.6 11/07/2017   CL 100 (L) 11/07/2017   CO2 26 11/07/2017   BUN 11 11/07/2017   CREATININE 1.06 (H) 11/07/2017   GLUCOSE 77 11/07/2017   Lab Results  Component Value Date   INR 0.95 05/02/2013    Assessment/Plan: L2-3 spondylolisthesis, spinal stenosis disc degeneration, lumbago, lumbar radiculopathy, neurogenic claudication: I have discussed the situation with the patient and reviewed her imaging studies with her.  We have discussed the various treatment options including surgery.  I have described the surgical treatment option of an L2-3 decompression, instrumentation and fusion.  I have shown her surgical models.  I have given her a surgical pamphlet.  We have discussed the risks, benefits, alternatives, expected postoperative course, and likelihood of achieving our goals with surgery.  I have answered all her questions.  She has decided to proceed with surgery.   [name] 11/15/2017 12:31 PM

## 2017-11-16 LAB — CBC
HCT: 26.1 % — ABNORMAL LOW (ref 36.0–46.0)
HEMOGLOBIN: 7.3 g/dL — AB (ref 12.0–15.0)
MCH: 18.6 pg — AB (ref 26.0–34.0)
MCHC: 28 g/dL — AB (ref 30.0–36.0)
MCV: 66.6 fL — ABNORMAL LOW (ref 78.0–100.0)
Platelets: 320 10*3/uL (ref 150–400)
RBC: 3.92 MIL/uL (ref 3.87–5.11)
RDW: 18.9 % — ABNORMAL HIGH (ref 11.5–15.5)
WBC: 9 10*3/uL (ref 4.0–10.5)

## 2017-11-16 LAB — HEMOGLOBIN A1C
HEMOGLOBIN A1C: 8 % — AB (ref 4.8–5.6)
Mean Plasma Glucose: 182.9 mg/dL

## 2017-11-16 LAB — BASIC METABOLIC PANEL
Anion gap: 12 (ref 5–15)
BUN: 16 mg/dL (ref 6–20)
CALCIUM: 8.3 mg/dL — AB (ref 8.9–10.3)
CHLORIDE: 103 mmol/L (ref 101–111)
CO2: 19 mmol/L — ABNORMAL LOW (ref 22–32)
CREATININE: 1.13 mg/dL — AB (ref 0.44–1.00)
GFR calc non Af Amer: 49 mL/min — ABNORMAL LOW (ref >60)
GFR, EST AFRICAN AMERICAN: 57 mL/min — AB (ref >60)
Glucose, Bld: 378 mg/dL — ABNORMAL HIGH (ref 65–99)
Potassium: 4.7 mmol/L (ref 3.5–5.1)
SODIUM: 134 mmol/L — AB (ref 135–145)

## 2017-11-16 LAB — GLUCOSE, CAPILLARY
GLUCOSE-CAPILLARY: 28 mg/dL — AB (ref 65–99)
GLUCOSE-CAPILLARY: 173 mg/dL — AB (ref 65–99)
GLUCOSE-CAPILLARY: 327 mg/dL — AB (ref 65–99)
GLUCOSE-CAPILLARY: 148 mg/dL — AB (ref 65–99)
Glucose-Capillary: 398 mg/dL — ABNORMAL HIGH (ref 65–99)

## 2017-11-16 MED ORDER — FERROUS SULFATE 325 (65 FE) MG PO TABS
325.0000 mg | ORAL_TABLET | Freq: Two times a day (BID) | ORAL | Status: DC
  Administered 2017-11-16 – 2017-11-17 (×2): 325 mg via ORAL
  Filled 2017-11-16 (×3): qty 1

## 2017-11-16 MED FILL — Heparin Sodium (Porcine) Inj 1000 Unit/ML: INTRAMUSCULAR | Qty: 30 | Status: AC

## 2017-11-16 MED FILL — Sodium Chloride IV Soln 0.9%: INTRAVENOUS | Qty: 1000 | Status: AC

## 2017-11-16 NOTE — Progress Notes (Signed)
Subjective: The patient is alert and pleasant.  She is in no apparent distress.  Her back is appropriately sore.  She looks well.  Objective: Vital signs in last 24 hours: Temp:  [98 F (36.7 C)-98.9 F (37.2 C)] 98.5 F (36.9 C) (04/11 0725) Pulse Rate:  [95-107] 101 (04/11 0725) Resp:  [14-24] 16 (04/11 0725) BP: (113-188)/(49-75) 120/49 (04/11 0725) SpO2:  [92 %-100 %] 96 % (04/11 0725) Weight:  [92.2 kg (203 lb 3 oz)] 92.2 kg (203 lb 3 oz) (04/10 1214) Estimated body mass index is 35.99 kg/m as calculated from the following:   Height as of this encounter: 5\' 3"  (1.6 m).   Weight as of this encounter: 92.2 kg (203 lb 3 oz).   Intake/Output from previous day: 04/10 0701 - 04/11 0700 In: 1150 [P.O.:50; I.V.:1000] Out: 1600 [Urine:1400; Blood:200] Intake/Output this shift: No intake/output data recorded.  Physical exam the patient is alert and oriented.  Her strength is grossly normal in her lower extremities.  Lab Results: Recent Labs    11/16/17 0635  WBC 9.0  HGB 7.3*  HCT 26.1*  PLT 320   BMET Recent Labs    11/16/17 0635  NA 134*  K 4.7  CL 103  CO2 19*  GLUCOSE 378*  BUN 16  CREATININE 1.13*  CALCIUM 8.3*    Studies/Results: Dg Lumbar Spine 2-3 Views  Result Date: 11/15/2017 CLINICAL DATA:  POSTERIOR LUMBAR INTERBODY FUSION, POSTERIOR INSTRUMENTATION, POSSIBLE INTERBODY PROSTHESIS LUMBAR TWO- LUMBAR THREE; EXPLORE LUMBAR FUSION. Fluoro time: 17 seconds. EXAM: DG C-ARM 61-120 MIN; LUMBAR SPINE - 2-3 VIEW COMPARISON:  MRI, 09/24/2017.  Radiographs, 08/22/2017. FINDINGS: Two submitted operative images show placement of new pedicle screws at L2 with a metallic intervertebral spacer. Pedicle screws and spacer appear well-positioned. The pedicle screws at L3 and L4 are unchanged from prior exam. IMPRESSION: Operative imaging provided or extension of the posterior lumbar spine fusion to the L2 vertebra. Electronically Signed   By: Lajean Manes M.D.   On:  11/15/2017 16:05   Dg C-arm 1-60 Min  Result Date: 11/15/2017 CLINICAL DATA:  POSTERIOR LUMBAR INTERBODY FUSION, POSTERIOR INSTRUMENTATION, POSSIBLE INTERBODY PROSTHESIS LUMBAR TWO- LUMBAR THREE; EXPLORE LUMBAR FUSION. Fluoro time: 17 seconds. EXAM: DG C-ARM 61-120 MIN; LUMBAR SPINE - 2-3 VIEW COMPARISON:  MRI, 09/24/2017.  Radiographs, 08/22/2017. FINDINGS: Two submitted operative images show placement of new pedicle screws at L2 with a metallic intervertebral spacer. Pedicle screws and spacer appear well-positioned. The pedicle screws at L3 and L4 are unchanged from prior exam. IMPRESSION: Operative imaging provided or extension of the posterior lumbar spine fusion to the L2 vertebra. Electronically Signed   By: Lajean Manes M.D.   On: 11/15/2017 16:05    Assessment/Plan: Postop day #1: The patient seems to be doing well.  We will mobilize her with PT and OT.  She may go home tomorrow.  Chronic anemia exacerbated by acute blood loss anemia: Patient is clinically asymptomatic.  I will start iron and will plan to repeat her CBC tomorrow.      LOS: 1 day     [name] 11/16/2017, 9:43 AM

## 2017-11-16 NOTE — Progress Notes (Signed)
Occupational Therapy Evaluation Patient Details Name: [name] MRN: [id number] DOB: 04/10/1951 Today's Date: 11/16/2017    History of Present Illness 67 year old white female who has had a previous lumbar instrumentation and fusion; cervical fusion; arthritis. Underwent L2-4 PLIF.    Clinical Impression   Completed all education regarding back precautions for ADL and mobility with necessary AE and DME. Pt safe to DC home when medically stable.     Follow Up Recommendations  No OT follow up;Supervision - Intermittent    Equipment Recommendations       Recommendations for Other Services       Precautions / Restrictions Precautions Precautions: Back Precaution Booklet Issued: Yes (comment) Required Braces or Orthoses: Spinal Brace Spinal Brace: Applied in sitting position      Mobility Bed Mobility Overal bed mobility: Needs Assistance             General bed mobility comments: Pt states her husband normally helps her get her feet in/out of bed. REviewed technique  Transfers Overall transfer level: Modified independent                    Balance Overall balance assessment: No apparent balance deficits (not formally assessed)                                         ADL either performed or assessed with clinical judgement   ADL Overall ADL's : Needs assistance/impaired                               Toileting - Clothing Manipulation Details (indicate cue type and reason): pt uses toilet tongs     Functional mobility during ADLs: Modified independent General ADL Comments: Pt overall set up for ADL. Reviewed back precautions for ADL adn pt ableto return demonstrate. Pt has AE adn DME needed for ADL. Handout reviewed     Vision         Perception     Praxis      Pertinent Vitals/Pain Pain Assessment: 0-10 Pain Score: 6  Pain Location: back Pain Descriptors / Indicators: Aching;Discomfort Pain Intervention(s):  Limited activity within patient's tolerance     Hand Dominance Right   Extremity/Trunk Assessment Upper Extremity Assessment Upper Extremity Assessment: Overall WFL for tasks assessed   Lower Extremity Assessment Lower Extremity Assessment: Defer to PT evaluation   Cervical / Trunk Assessment Cervical / Trunk Assessment: Other exceptions(back fusion)   Communication Communication Communication: No difficulties   Cognition Arousal/Alertness: Awake/alert Behavior During Therapy: WFL for tasks assessed/performed Overall Cognitive Status: Within Functional Limits for tasks assessed                                     General Comments       Exercises     Shoulder Instructions      Home Living Family/patient expects to be discharged to:: Private residence Living Arrangements: Spouse/significant other Available Help at Discharge: Family;Available PRN/intermittently Type of Home: House Home Access: Stairs to enter [employer] of Steps: 4 Entrance Stairs-Rails: None Home Layout: One level     Bathroom Shower/Tub: Producer, television/film/video: Standard Bathroom Accessibility: Yes How Accessible: Accessible via walker Home Equipment: Walker - 2 wheels;Cane - single point;Bedside  commode;Shower seat - built in          Prior Functioning/Environment Level of Independence: Independent                 OT Problem List: Decreased knowledge of use of DME or AE;Decreased knowledge of precautions;Pain      OT Treatment/Interventions:      OT Goals(Current goals can be found in the care plan section) Acute Rehab OT Goals Patient Stated Goal: to be independent OT Goal Formulation: All assessment and education complete, DC therapy  OT Frequency:     Barriers to D/C:            Co-evaluation              AM-PAC PT "6 Clicks" Daily Activity     Outcome Measure Help from another person eating meals?: None Help from another  person taking care of personal grooming?: None Help from another person toileting, which includes using toliet, bedpan, or urinal?: None Help from another person bathing (including washing, rinsing, drying)?: A Little Help from another person to put on and taking off regular upper body clothing?: None Help from another person to put on and taking off regular lower body clothing?: A Little 6 Click Score: 22   End of Session Nurse Communication: Mobility status;Other (comment)(order for brace - nsg has ordered)  Activity Tolerance: Patient tolerated treatment well Patient left: in bed;with call bell/phone within reach  OT Visit Diagnosis: Pain Pain - part of body: (back)                Time: 1660-6301 OT Time Calculation (min): 17 min Charges:  OT General Charges $OT Visit: 1 Visit OT Evaluation $OT Eval Low Complexity: 1 Low G-Codes:     Jawaan Adachi, OT/L  [phone] 11/16/2017  Harvel Meskill,HILLARY 11/16/2017, 9:23 AM

## 2017-11-16 NOTE — Progress Notes (Signed)
Inpatient Diabetes Program Recommendations  AACE/ADA: New Consensus Statement on Inpatient Glycemic Control (2015)  Target Ranges:  Prepandial:   less than 140 mg/dL      Peak postprandial:   less than 180 mg/dL (1-2 hours)      Critically ill patients:  140 - 180 mg/dL   Lab Results  Component Value Date   XKPVVZ 482 (H) 11/16/2017   HGBA1C 8.0 (H) 11/16/2017    Review of Glycemic Control  Diabetes history: DM2 Outpatient Diabetes medications: Jardiance 10 mg QD, Novolin 70/30 50 units bid Current orders for Inpatient glycemic control: Novolog 0-20 units tidwc and hs, Invokana 100 mg QD  Had severe lows yesterday after surgery. Will need part of 70/30 insulin since eating.  Inpatient Diabetes Program Recommendations:     70/30 25 units bid (1/2 home dose)  Continue to follow while inpatient.  Thank you. Lorenda Peck, RD, LDN, CDE Inpatient Diabetes Coordinator [phone]

## 2017-11-16 NOTE — Evaluation (Signed)
Physical Therapy Evaluation and Discharge Patient Details Name: [name] MRN: [id number] DOB: 24-Feb-1951 Today's Date: 11/16/2017   History of Present Illness  67 year old white female who has had a previous lumbar instrumentation and fusion; cervical fusion; arthritis. Underwent L2-4 PLIF.   Clinical Impression  Patient evaluated by Physical Therapy with no further acute PT needs identified. All education has been completed and the patient has no further questions. At the time of PT eval pt was able to perform transfers and ambulation with gross modified independence and stair training with up to [facility] for balance support without railings. Pt was educated on brace application/wearing schedule, precautions, car transfer, and general safety with activity progression. See below for any follow-up Physical Therapy or equipment needs. PT is signing off. Thank you for this referral.     Follow Up Recommendations No PT follow up;Supervision for mobility/OOB    Equipment Recommendations  None recommended by PT    Recommendations for Other Services       Precautions / Restrictions Precautions Precautions: Back Precaution Booklet Issued: Yes (comment) Required Braces or Orthoses: Spinal Brace Spinal Brace: Applied in sitting position Restrictions Weight Bearing Restrictions: No      Mobility  Bed Mobility Overal bed mobility: Modified Independent Bed Mobility: Rolling;Sit to Sidelying Rolling: Modified independent (Device/Increase time)       Sit to sidelying: Min guard General bed mobility comments: Pt states her husband normally helps her get her feet in/out of bed. Was able to transition to supine without assistance. HOB flat to simulate home environment.   Transfers Overall transfer level: Modified independent Equipment used: None             General transfer comment: Pt was able to power-up to full stand without assistance. Demonstrated proper hand placement on seated  surface for safety and controlled descent to bed without assist.   Ambulation/Gait Ambulation/Gait assistance: Modified independent (Device/Increase time) Ambulation Distance (Feet): 400 Feet Assistive device: None Gait Pattern/deviations: Step-through pattern;Decreased stride length Gait velocity: Decreased Gait velocity interpretation: Below normal speed for age/gender General Gait Details: Pt was able to ambulate well in the hall without any noted unsteadiness or LOB. Several standing rest breaks required especially towards the end of gait training due to B hip pain which pt states feels "muscular" in nature.   Stairs Stairs: Yes Stairs assistance: Supervision;Min guard Stair Management: One rail Right;No rails Number of Stairs: 4 General stair comments: With railing use, pt at a supervision level. Without railing use, HHA provided and pt was able to complete without difficulty.   Wheelchair Mobility    Modified Rankin (Stroke Patients Only)       Balance Overall balance assessment: No apparent balance deficits (not formally assessed)                                           Pertinent Vitals/Pain Pain Assessment: Faces Pain Score: 6  Faces Pain Scale: Hurts little more Pain Location: back Pain Descriptors / Indicators: Aching;Discomfort Pain Intervention(s): Monitored during session    Home Living Family/patient expects to be discharged to:: Private residence Living Arrangements: Spouse/significant other Available Help at Discharge: Family;Available PRN/intermittently Type of Home: House Home Access: Stairs to enter Entrance Stairs-Rails: None Entrance Stairs-Number of Steps: 4 Home Layout: One level Home Equipment: Walker - 2 wheels;Cane - single point;Bedside commode;Shower seat - built in  Prior Function Level of Independence: Independent               Hand Dominance   Dominant Hand: Right    Extremity/Trunk Assessment    Upper Extremity Assessment Upper Extremity Assessment: Defer to OT evaluation    Lower Extremity Assessment Lower Extremity Assessment: RLE deficits/detail;LLE deficits/detail RLE Deficits / Details: Acute pain in hip - worsening with ambulation LLE Deficits / Details: Pain in hip that pt states was present prior to surgery but worse now - increases with ambulation    Cervical / Trunk Assessment Cervical / Trunk Assessment: Other exceptions(back fusion)  Communication   Communication: No difficulties  Cognition Arousal/Alertness: Awake/alert Behavior During Therapy: WFL for tasks assessed/performed Overall Cognitive Status: Within Functional Limits for tasks assessed                                        General Comments      Exercises     Assessment/Plan    PT Assessment Patent does not need any further PT services  PT Problem List         PT Treatment Interventions      PT Goals (Current goals can be found in the Care Plan section)  Acute Rehab PT Goals Patient Stated Goal: to be independent PT Goal Formulation: All assessment and education complete, DC therapy    Frequency     Barriers to discharge        Co-evaluation               AM-PAC PT "6 Clicks" Daily Activity  Outcome Measure Difficulty turning over in bed (including adjusting bedclothes, sheets and blankets)?: None Difficulty moving from lying on back to sitting on the side of the bed? : None Difficulty sitting down on and standing up from a chair with arms (e.g., wheelchair, bedside commode, etc,.)?: None Help needed moving to and from a bed to chair (including a wheelchair)?: None Help needed walking in hospital room?: None Help needed climbing 3-5 steps with a railing? : A Little 6 Click Score: 23    End of Session Equipment Utilized During Treatment: Back brace Activity Tolerance: Patient tolerated treatment well Patient left: in bed;with call bell/phone within  reach Nurse Communication: Mobility status PT Visit Diagnosis: Unsteadiness on feet (R26.81);Pain;Other symptoms and signs involving the nervous system (R29.898) Pain - part of body: Hip(back)    Time: 6468-0321 PT Time Calculation (min) (ACUTE ONLY): 20 min   Charges:   PT Evaluation $PT Eval Moderate Complexity: 1 Mod     PT G Codes:        Rolinda Roan, PT, DPT Acute Rehabilitation Services Pager: [place] 11/16/2017, 10:50 AM

## 2017-11-17 LAB — GLUCOSE, CAPILLARY
GLUCOSE-CAPILLARY: 269 mg/dL — AB (ref 65–99)
GLUCOSE-CAPILLARY: 92 mg/dL (ref 65–99)

## 2017-11-17 LAB — CBC
HCT: 26.6 % — ABNORMAL LOW (ref 36.0–46.0)
HEMOGLOBIN: 7.3 g/dL — AB (ref 12.0–15.0)
MCH: 18.4 pg — AB (ref 26.0–34.0)
MCHC: 27.4 g/dL — ABNORMAL LOW (ref 30.0–36.0)
MCV: 67 fL — AB (ref 78.0–100.0)
Platelets: 393 10*3/uL (ref 150–400)
RBC: 3.97 MIL/uL (ref 3.87–5.11)
RDW: 19.1 % — ABNORMAL HIGH (ref 11.5–15.5)
WBC: 13.1 10*3/uL — ABNORMAL HIGH (ref 4.0–10.5)

## 2017-11-17 MED ORDER — METHOCARBAMOL 500 MG PO TABS
500.0000 mg | ORAL_TABLET | Freq: Four times a day (QID) | ORAL | Status: DC | PRN
Start: 2017-11-17 — End: 2017-11-17
  Administered 2017-11-17 (×2): 500 mg via ORAL
  Filled 2017-11-17 (×2): qty 1

## 2017-11-17 MED ORDER — FERROUS SULFATE 325 (65 FE) MG PO TABS: 325.0000 mg | ORAL_TABLET | Freq: Two times a day (BID) | ORAL | 3 refills | Status: DC

## 2017-11-17 MED ORDER — DOCUSATE SODIUM 100 MG PO CAPS: 100.0000 mg | ORAL_CAPSULE | Freq: Two times a day (BID) | ORAL | 0 refills | Status: DC

## 2017-11-17 MED ORDER — OXYCODONE HCL 5 MG PO TABS: 5.0000 mg | ORAL_TABLET | ORAL | 0 refills | Status: DC | PRN

## 2017-11-17 NOTE — Progress Notes (Signed)
Patient alert and oriented, mae's well, voiding adequate amount of urine, swallowing without difficulty, c/o mild pain at time of discharge. Patient discharged home with family. Script and discharged instructions given to patient. Patient and family stated understanding of instructions given. Patient has an appointment with Dr. Jenkins 

## 2017-11-17 NOTE — Discharge Summary (Signed)
Physician Discharge Summary  Patient ID: [name] MRN: [id number] DOB/AGE: 11-Nov-1950 67 y.o.  Admit date: 11/15/2017 Discharge date: 11/17/2017  Admission Diagnoses: L2-3 degenerative disc disease, spinal stenosis, lumbago, lumbar radiculopathy, acute blood loss anemia  Discharge Diagnoses: The same Active Problems:   Spondylolisthesis of lumbar region   Discharged Condition: good  Hospital Course: I performed an exploration of the patient's lumbar fusion with an L2-3 decompression, instrumentation and fusion L4 1019.  The surgery went well.  The patient's postoperative course was unremarkable except as follows.  She is a known diabetic and has had some hypo-and hyperglycemia.  She takes insulin at home is going to monitor her blood sugar.  The patient was also chronically anemic.  Her postoperative hemoglobin was 7.3.  She is clinically asymptomatic from this anemia.  She was started on iron at the time of this dictation her repeat CBC has been drawn but the results are pending.  If her hemoglobin is stable we will discharge her home on iron.  On postoperative day #2 the patient requested discharge home.  She was given written and oral discharge instructions.  She will be tentatively discharged pending her CBC results.  Consults: Physical therapy Significant Diagnostic Studies: None Treatments: L2-3 decompression, instrumentation, fusion, exploration of lumbar fusion/removal of lumbar hardware Discharge Exam: Blood pressure 138/71, pulse (!) 116, temperature 98.5 F (36.9 C), temperature source Oral, resp. rate 16, height 5\' 3"  (1.6 m), weight 92.2 kg (203 lb 3 oz), SpO2 96 %. The patient is alert and pleasant.  She looks well.  Her strength is normal.  Disposition: Home   Allergies as of 11/17/2017      Reactions   Codeine Nausea And Vomiting   Ambien [zolpidem Tartrate] Other (See Comments)   Up sleep walking and eating   Crestor [rosuvastatin] Other (See Comments)   Muscle weakness, cramps and aching all over body   Lipitor [atorvastatin] Itching, Other (See Comments)   Muscle weakness, cramps and aches all over body   Morphine And Related Other (See Comments)   Flushing and feeling hot Tolerates with Diphenhydramine   Penicillins Rash, Other (See Comments)   Caused Headaches also Has patient had a PCN reaction causing immediate rash, facial/tongue/throat swelling, SOB or lightheadedness with hypotension: No Has patient had a PCN reaction causing severe rash involving mucus membranes or skin necrosis: No Has patient had a PCN reaction that required hospitalization No Has patient had a PCN reaction occurring within the last 10 years: No If all of the above answers are "NO", then may proceed with Cephalosporin use.   Statins Other (See Comments)   Muscle weakness, cramps and aching all over body   Dilaudid [hydromorphone Hcl] Other (See Comments)   Hallucinations/ argumentative, goes beserk   Amitriptyline Other (See Comments)   Loopy feeling   Ceftin [cefuroxime Axetil] Other (See Comments)   Stomach pain    Lovaza [omega-3-acid Ethyl Esters] Other (See Comments)   Stomach pain    Lunesta [eszopiclone] Other (See Comments)   Causes dizziness   Lyrica [pregabalin] Nausea Only   Metformin And Related Nausea Only   Pt denies this intolerance      Medication List    STOP taking these medications   HYDROcodone-acetaminophen 5-325 MG tablet Commonly known as:  NORCO/VICODIN     TAKE these medications   acetaminophen 500 MG tablet Commonly known as:  TYLENOL Take 1,000 mg by mouth every 8 (eight) hours as needed for mild pain or headache.  amLODipine 5 MG tablet Commonly known as:  NORVASC Take 5 mg by mouth daily.   aspirin 81 MG chewable tablet Chew 81 mg by mouth daily.   B-12 2500 MCG Tabs Take 2,500 mcg by mouth daily.   clobetasol 0.05 % external solution Commonly known as:  TEMOVATE Apply 1 application topically See admin  instructions. Apply twice daily to scalp and ear as needed for itching/rash (do not apply to face/groin/underarms)   cyclobenzaprine 10 MG tablet Commonly known as:  FLEXERIL Take 1 tablet (10 mg total) by mouth 3 (three) times daily as needed for muscle spasms.   docusate sodium 100 MG capsule Commonly known as:  COLACE Take 1 capsule (100 mg total) by mouth 2 (two) times daily.   doxycycline 100 MG tablet Commonly known as:  VIBRA-TABS Take 100 mg by mouth 2 (two) times daily as needed. For scalp/skin breakdowns.   DULoxetine 60 MG capsule Commonly known as:  CYMBALTA Take 120 mg by mouth daily.   ferrous sulfate 325 (65 FE) MG tablet Take 1 tablet (325 mg total) by mouth 2 (two) times daily with a meal.   fluticasone 50 MCG/ACT nasal spray Commonly known as:  FLONASE Place 2 sprays into both nostrils daily as needed for allergies or rhinitis.   furosemide 20 MG tablet Commonly known as:  LASIX Take 20 mg by mouth daily as needed for fluid or edema.   JARDIANCE 10 MG Tabs tablet Generic drug:  empagliflozin Take 10 mg by mouth daily.   levothyroxine 150 MCG tablet Commonly known as:  SYNTHROID, LEVOTHROID Take 150 mcg by mouth daily before breakfast.   NOVOLIN 70/30 RELION (70-30) 100 UNIT/ML injection Generic drug:  insulin NPH-regular Human Inject 50 Units into the skin 2 (two) times daily.   nystatin 100000 UNIT/ML suspension Commonly known as:  MYCOSTATIN Use as directed 5 mLs in the mouth or throat 4 (four) times daily as needed (for tongue burning/mouth sores.).   olmesartan 40 MG tablet Commonly known as:  BENICAR Take 40 mg by mouth daily.   oxyCODONE 5 MG immediate release tablet Commonly known as:  Oxy IR/ROXICODONE Take 1 tablet (5 mg total) by mouth every 4 (four) hours as needed for moderate pain ((score 4 to 6)).   pantoprazole 40 MG tablet Commonly known as:  PROTONIX Take 40 mg by mouth 2 (two) times daily.   PROBIOTIC PO Take 1 capsule by  mouth daily.   promethazine 25 MG tablet Commonly known as:  PHENERGAN Take 25 mg by mouth every 8 (eight) hours as needed for nausea or vomiting.   temazepam 15 MG capsule Commonly known as:  RESTORIL Take 30 mg by mouth at bedtime.        Signed: [name] 11/17/2017, 7:08 AM

## 2017-11-18 ENCOUNTER — Other Ambulatory Visit: Payer: Self-pay

## 2017-11-18 ENCOUNTER — Emergency Department (HOSPITAL_COMMUNITY): Payer: Medicare Other

## 2017-11-18 ENCOUNTER — Inpatient Hospital Stay (HOSPITAL_COMMUNITY): Payer: Medicare Other

## 2017-11-18 ENCOUNTER — Inpatient Hospital Stay (HOSPITAL_COMMUNITY)
Admission: EM | Admit: 2017-11-18 | Discharge: 2017-11-25 | DRG: 871 | Disposition: A | Discharge status: Home Health | Payer: Medicare Other | Attending: Family Medicine | Admitting: Family Medicine

## 2017-11-18 ENCOUNTER — Encounter (HOSPITAL_COMMUNITY): Payer: Self-pay | Admitting: Emergency Medicine

## 2017-11-18 DIAGNOSIS — E1011 Type 1 diabetes mellitus with ketoacidosis with coma: Secondary | ICD-10-CM

## 2017-11-18 DIAGNOSIS — I351 Nonrheumatic aortic (valve) insufficiency: Secondary | ICD-10-CM | POA: Diagnosis not present

## 2017-11-18 DIAGNOSIS — Z981 Arthrodesis status: Secondary | ICD-10-CM

## 2017-11-18 DIAGNOSIS — M4712 Other spondylosis with myelopathy, cervical region: Secondary | ICD-10-CM | POA: Diagnosis present

## 2017-11-18 DIAGNOSIS — Z7189 Other specified counseling: Secondary | ICD-10-CM

## 2017-11-18 DIAGNOSIS — I824Z2 Acute embolism and thrombosis of unspecified deep veins of left distal lower extremity: Secondary | ICD-10-CM | POA: Diagnosis present

## 2017-11-18 DIAGNOSIS — R410 Disorientation, unspecified: Secondary | ICD-10-CM | POA: Diagnosis not present

## 2017-11-18 DIAGNOSIS — J150 Pneumonia due to Klebsiella pneumoniae: Secondary | ICD-10-CM | POA: Diagnosis present

## 2017-11-18 DIAGNOSIS — D696 Thrombocytopenia, unspecified: Secondary | ICD-10-CM | POA: Diagnosis present

## 2017-11-18 DIAGNOSIS — Z888 Allergy status to other drugs, medicaments and biological substances status: Secondary | ICD-10-CM

## 2017-11-18 DIAGNOSIS — I2510 Atherosclerotic heart disease of native coronary artery without angina pectoris: Secondary | ICD-10-CM | POA: Diagnosis present

## 2017-11-18 DIAGNOSIS — E1010 Type 1 diabetes mellitus with ketoacidosis without coma: Secondary | ICD-10-CM | POA: Diagnosis present

## 2017-11-18 DIAGNOSIS — E039 Hypothyroidism, unspecified: Secondary | ICD-10-CM | POA: Diagnosis present

## 2017-11-18 DIAGNOSIS — D631 Anemia in chronic kidney disease: Secondary | ICD-10-CM | POA: Diagnosis present

## 2017-11-18 DIAGNOSIS — I248 Other forms of acute ischemic heart disease: Secondary | ICD-10-CM | POA: Diagnosis present

## 2017-11-18 DIAGNOSIS — E10649 Type 1 diabetes mellitus with hypoglycemia without coma: Secondary | ICD-10-CM | POA: Diagnosis present

## 2017-11-18 DIAGNOSIS — Z452 Encounter for adjustment and management of vascular access device: Secondary | ICD-10-CM

## 2017-11-18 DIAGNOSIS — I110 Hypertensive heart disease with heart failure: Secondary | ICD-10-CM | POA: Diagnosis not present

## 2017-11-18 DIAGNOSIS — J9602 Acute respiratory failure with hypercapnia: Secondary | ICD-10-CM | POA: Diagnosis not present

## 2017-11-18 DIAGNOSIS — D500 Iron deficiency anemia secondary to blood loss (chronic): Secondary | ICD-10-CM | POA: Diagnosis not present

## 2017-11-18 DIAGNOSIS — E1111 Type 2 diabetes mellitus with ketoacidosis with coma: Secondary | ICD-10-CM | POA: Diagnosis not present

## 2017-11-18 DIAGNOSIS — Z881 Allergy status to other antibiotic agents status: Secondary | ICD-10-CM

## 2017-11-18 DIAGNOSIS — I4510 Unspecified right bundle-branch block: Secondary | ICD-10-CM | POA: Diagnosis present

## 2017-11-18 DIAGNOSIS — M1990 Unspecified osteoarthritis, unspecified site: Secondary | ICD-10-CM | POA: Diagnosis present

## 2017-11-18 DIAGNOSIS — K5730 Diverticulosis of large intestine without perforation or abscess without bleeding: Secondary | ICD-10-CM | POA: Diagnosis present

## 2017-11-18 DIAGNOSIS — G92 Toxic encephalopathy: Secondary | ICD-10-CM | POA: Diagnosis not present

## 2017-11-18 DIAGNOSIS — E7220 Disorder of urea cycle metabolism, unspecified: Secondary | ICD-10-CM | POA: Diagnosis not present

## 2017-11-18 DIAGNOSIS — J9601 Acute respiratory failure with hypoxia: Secondary | ICD-10-CM | POA: Diagnosis not present

## 2017-11-18 DIAGNOSIS — G2581 Restless legs syndrome: Secondary | ICD-10-CM | POA: Diagnosis present

## 2017-11-18 DIAGNOSIS — A4189 Other specified sepsis: Secondary | ICD-10-CM | POA: Diagnosis not present

## 2017-11-18 DIAGNOSIS — I639 Cerebral infarction, unspecified: Secondary | ICD-10-CM | POA: Diagnosis not present

## 2017-11-18 DIAGNOSIS — D649 Anemia, unspecified: Secondary | ICD-10-CM | POA: Diagnosis not present

## 2017-11-18 DIAGNOSIS — R109 Unspecified abdominal pain: Secondary | ICD-10-CM | POA: Diagnosis present

## 2017-11-18 DIAGNOSIS — I5030 Unspecified diastolic (congestive) heart failure: Secondary | ICD-10-CM | POA: Diagnosis not present

## 2017-11-18 DIAGNOSIS — R4020 Unspecified coma: Secondary | ICD-10-CM | POA: Diagnosis not present

## 2017-11-18 DIAGNOSIS — R402431 Glasgow coma scale score 3-8, in the field [EMT or ambulance]: Secondary | ICD-10-CM | POA: Diagnosis not present

## 2017-11-18 DIAGNOSIS — R6521 Severe sepsis with septic shock: Secondary | ICD-10-CM | POA: Diagnosis present

## 2017-11-18 DIAGNOSIS — K760 Fatty (change of) liver, not elsewhere classified: Secondary | ICD-10-CM | POA: Diagnosis present

## 2017-11-18 DIAGNOSIS — G9340 Encephalopathy, unspecified: Secondary | ICD-10-CM | POA: Diagnosis not present

## 2017-11-18 DIAGNOSIS — D509 Iron deficiency anemia, unspecified: Secondary | ICD-10-CM | POA: Diagnosis not present

## 2017-11-18 DIAGNOSIS — I6389 Other cerebral infarction: Secondary | ICD-10-CM | POA: Diagnosis not present

## 2017-11-18 DIAGNOSIS — Z7982 Long term (current) use of aspirin: Secondary | ICD-10-CM

## 2017-11-18 DIAGNOSIS — G9341 Metabolic encephalopathy: Secondary | ICD-10-CM | POA: Diagnosis not present

## 2017-11-18 DIAGNOSIS — E1110 Type 2 diabetes mellitus with ketoacidosis without coma: Secondary | ICD-10-CM | POA: Diagnosis present

## 2017-11-18 DIAGNOSIS — A419 Sepsis, unspecified organism: Secondary | ICD-10-CM

## 2017-11-18 DIAGNOSIS — Z0189 Encounter for other specified special examinations: Secondary | ICD-10-CM

## 2017-11-18 DIAGNOSIS — G894 Chronic pain syndrome: Secondary | ICD-10-CM | POA: Diagnosis present

## 2017-11-18 DIAGNOSIS — B961 Klebsiella pneumoniae [K. pneumoniae] as the cause of diseases classified elsewhere: Secondary | ICD-10-CM | POA: Diagnosis present

## 2017-11-18 DIAGNOSIS — Z9049 Acquired absence of other specified parts of digestive tract: Secondary | ICD-10-CM

## 2017-11-18 DIAGNOSIS — E876 Hypokalemia: Secondary | ICD-10-CM | POA: Diagnosis present

## 2017-11-18 DIAGNOSIS — E870 Hyperosmolality and hypernatremia: Secondary | ICD-10-CM | POA: Diagnosis present

## 2017-11-18 DIAGNOSIS — Z978 Presence of other specified devices: Secondary | ICD-10-CM

## 2017-11-18 DIAGNOSIS — R0902 Hypoxemia: Secondary | ICD-10-CM | POA: Diagnosis not present

## 2017-11-18 DIAGNOSIS — I080 Rheumatic disorders of both mitral and aortic valves: Secondary | ICD-10-CM | POA: Diagnosis not present

## 2017-11-18 DIAGNOSIS — F329 Major depressive disorder, single episode, unspecified: Secondary | ICD-10-CM | POA: Diagnosis present

## 2017-11-18 DIAGNOSIS — J9811 Atelectasis: Secondary | ICD-10-CM | POA: Diagnosis not present

## 2017-11-18 DIAGNOSIS — I214 Non-ST elevation (NSTEMI) myocardial infarction: Secondary | ICD-10-CM | POA: Diagnosis present

## 2017-11-18 DIAGNOSIS — R092 Respiratory arrest: Secondary | ICD-10-CM | POA: Diagnosis present

## 2017-11-18 DIAGNOSIS — R0603 Acute respiratory distress: Secondary | ICD-10-CM | POA: Diagnosis not present

## 2017-11-18 DIAGNOSIS — I5032 Chronic diastolic (congestive) heart failure: Secondary | ICD-10-CM | POA: Diagnosis present

## 2017-11-18 DIAGNOSIS — I6330 Cerebral infarction due to thrombosis of unspecified cerebral artery: Secondary | ICD-10-CM | POA: Diagnosis not present

## 2017-11-18 DIAGNOSIS — Z4682 Encounter for fitting and adjustment of non-vascular catheter: Secondary | ICD-10-CM | POA: Diagnosis not present

## 2017-11-18 DIAGNOSIS — I340 Nonrheumatic mitral (valve) insufficiency: Secondary | ICD-10-CM | POA: Diagnosis not present

## 2017-11-18 DIAGNOSIS — Z885 Allergy status to narcotic agent status: Secondary | ICD-10-CM

## 2017-11-18 DIAGNOSIS — Z79899 Other long term (current) drug therapy: Secondary | ICD-10-CM

## 2017-11-18 DIAGNOSIS — N179 Acute kidney failure, unspecified: Secondary | ICD-10-CM

## 2017-11-18 DIAGNOSIS — K219 Gastro-esophageal reflux disease without esophagitis: Secondary | ICD-10-CM | POA: Diagnosis present

## 2017-11-18 DIAGNOSIS — Z880 Allergy status to penicillin: Secondary | ICD-10-CM

## 2017-11-18 DIAGNOSIS — R9401 Abnormal electroencephalogram [EEG]: Secondary | ICD-10-CM | POA: Diagnosis present

## 2017-11-18 DIAGNOSIS — Z96659 Presence of unspecified artificial knee joint: Secondary | ICD-10-CM | POA: Diagnosis present

## 2017-11-18 DIAGNOSIS — M797 Fibromyalgia: Secondary | ICD-10-CM | POA: Diagnosis present

## 2017-11-18 DIAGNOSIS — R40243 Glasgow coma scale score 3-8, unspecified time: Secondary | ICD-10-CM | POA: Diagnosis present

## 2017-11-18 LAB — URINALYSIS, ROUTINE W REFLEX MICROSCOPIC
BILIRUBIN URINE: NEGATIVE
Glucose, UA: >=500 mg/dL — AB
Ketones, ur: 80 mg/dL — AB
Leukocytes, UA: NEGATIVE
NITRITE: NEGATIVE
PH: 5 (ref 5.0–8.0)
Protein, ur: NEGATIVE mg/dL
SPECIFIC GRAVITY, URINE: 1.018 (ref 1.005–1.030)

## 2017-11-18 LAB — CBC WITH DIFFERENTIAL/PLATELET
BASOS ABS: 0 10*3/uL (ref 0.0–0.1)
BASOS PCT: 0 %
Basophils Absolute: 0 10*3/uL (ref 0.0–0.1)
Basophils Relative: 0 %
EOS ABS: 0 10*3/uL (ref 0.0–0.7)
Eosinophils Absolute: 0 10*3/uL (ref 0.0–0.7)
Eosinophils Relative: 0 %
Eosinophils Relative: 0 %
HCT: 27.2 % — ABNORMAL LOW (ref 36.0–46.0)
HCT: 26.6 % — ABNORMAL LOW (ref 36.0–46.0)
HEMOGLOBIN: 6.8 g/dL — AB (ref 12.0–15.0)
Hemoglobin: 6.8 g/dL — CL (ref 12.0–15.0)
LYMPHS PCT: 9 %
Lymphocytes Relative: 5 %
Lymphs Abs: 2.6 10*3/uL (ref 0.7–4.0)
Lymphs Abs: 1.8 10*3/uL (ref 0.7–4.0)
MCH: 18.4 pg — ABNORMAL LOW (ref 26.0–34.0)
MCH: 17.8 pg — ABNORMAL LOW (ref 26.0–34.0)
MCHC: 25 g/dL — ABNORMAL LOW (ref 30.0–36.0)
MCHC: 25.6 g/dL — AB (ref 30.0–36.0)
MCV: 73.5 fL — ABNORMAL LOW (ref 78.0–100.0)
MCV: 69.5 fL — ABNORMAL LOW (ref 78.0–100.0)
MONO ABS: 1.1 10*3/uL — AB (ref 0.1–1.0)
MONOS PCT: 7 %
Monocytes Absolute: 2.1 10*3/uL — ABNORMAL HIGH (ref 0.1–1.0)
Monocytes Relative: 3 %
NEUTROS ABS: 24.6 10*3/uL — AB (ref 1.7–7.7)
NEUTROS PCT: 84 %
NEUTROS PCT: 92 %
Neutro Abs: 33.2 10*3/uL — ABNORMAL HIGH (ref 1.7–7.7)
PLATELETS: 572 10*3/uL — AB (ref 150–400)
Platelets: 708 10*3/uL — ABNORMAL HIGH (ref 150–400)
RBC: 3.7 MIL/uL — AB (ref 3.87–5.11)
RBC: 3.83 MIL/uL — ABNORMAL LOW (ref 3.87–5.11)
RDW: 20.4 % — ABNORMAL HIGH (ref 11.5–15.5)
RDW: 19.5 % — ABNORMAL HIGH (ref 11.5–15.5)
WBC: 29.3 10*3/uL — AB (ref 4.0–10.5)
WBC: 36.1 10*3/uL — ABNORMAL HIGH (ref 4.0–10.5)

## 2017-11-18 LAB — POCT I-STAT 3, ART BLOOD GAS (G3+)
ACID-BASE DEFICIT: 23 mmol/L — AB (ref 0.0–2.0)
Bicarbonate: 6.3 mmol/L — ABNORMAL LOW (ref 20.0–28.0)
O2 SAT: 100 %
PCO2 ART: 23.3 mmHg — AB (ref 32.0–48.0)
PH ART: 7.041 — AB (ref 7.350–7.450)
TCO2: 7 mmol/L — ABNORMAL LOW (ref 22–32)
pO2, Arterial: 347 mmHg — ABNORMAL HIGH (ref 83.0–108.0)

## 2017-11-18 LAB — BPAM RBC
Blood Product Expiration Date: 201905082359
Blood Product Expiration Date: 201905082359
UNIT TYPE AND RH: 5100
UNIT TYPE AND RH: 5100

## 2017-11-18 LAB — COMPREHENSIVE METABOLIC PANEL
ALBUMIN: 3 g/dL — AB (ref 3.5–5.0)
ALK PHOS: 104 U/L (ref 38–126)
ALK PHOS: 109 U/L (ref 38–126)
ALT: 18 U/L (ref 14–54)
ALT: 28 U/L (ref 14–54)
AST: 24 U/L (ref 15–41)
AST: 47 U/L — AB (ref 15–41)
Albumin: 2.9 g/dL — ABNORMAL LOW (ref 3.5–5.0)
Anion gap: 29 — ABNORMAL HIGH (ref 5–15)
BILIRUBIN TOTAL: 1.8 mg/dL — AB (ref 0.3–1.2)
BUN: 54 mg/dL — ABNORMAL HIGH (ref 6–20)
BUN: 57 mg/dL — ABNORMAL HIGH (ref 6–20)
CALCIUM: 8.3 mg/dL — AB (ref 8.9–10.3)
CHLORIDE: 100 mmol/L — AB (ref 101–111)
CO2: 4 mmol/L — ABNORMAL LOW (ref 22–32)
CO2: <7 mmol/L — ABNORMAL LOW (ref 22–32)
CREATININE: 2.99 mg/dL — AB (ref 0.44–1.00)
CREATININE: 3.62 mg/dL — AB (ref 0.44–1.00)
Calcium: 7.9 mg/dL — ABNORMAL LOW (ref 8.9–10.3)
Chloride: 106 mmol/L (ref 101–111)
GFR calc Af Amer: 18 mL/min — ABNORMAL LOW (ref >60)
GFR calc non Af Amer: 15 mL/min — ABNORMAL LOW (ref >60)
GFR calc non Af Amer: 12 mL/min — ABNORMAL LOW (ref >60)
GFR, EST AFRICAN AMERICAN: 14 mL/min — AB (ref >60)
GLUCOSE: 741 mg/dL — AB (ref 65–99)
Glucose, Bld: 605 mg/dL (ref 65–99)
Potassium: 6.9 mmol/L (ref 3.5–5.1)
Potassium: 5.3 mmol/L — ABNORMAL HIGH (ref 3.5–5.1)
Sodium: 133 mmol/L — ABNORMAL LOW (ref 135–145)
Sodium: 136 mmol/L (ref 135–145)
TOTAL PROTEIN: 5.6 g/dL — AB (ref 6.5–8.1)
Total Bilirubin: 1.9 mg/dL — ABNORMAL HIGH (ref 0.3–1.2)
Total Protein: 6 g/dL — ABNORMAL LOW (ref 6.5–8.1)

## 2017-11-18 LAB — TYPE AND SCREEN
ABO/RH(D): O POS
Antibody Screen: NEGATIVE
UNIT DIVISION: 0
Unit division: 0

## 2017-11-18 LAB — TSH: TSH: 0.265 u[IU]/mL — ABNORMAL LOW (ref 0.350–4.500)

## 2017-11-18 LAB — PHOSPHORUS: Phosphorus: 7.3 mg/dL — ABNORMAL HIGH (ref 2.5–4.6)

## 2017-11-18 LAB — GLUCOSE, CAPILLARY
GLUCOSE-CAPILLARY: 494 mg/dL — AB (ref 65–99)
Glucose-Capillary: 477 mg/dL — ABNORMAL HIGH (ref 65–99)
Glucose-Capillary: 523 mg/dL (ref 65–99)
Glucose-Capillary: 555 mg/dL (ref 65–99)
Glucose-Capillary: 559 mg/dL (ref 65–99)

## 2017-11-18 LAB — BLOOD GAS, ARTERIAL
DRAWN BY: 221791
FIO2: 100
MECHVT: 540 mL
O2 SAT: 93.8 %
PEEP: 5 cmH2O
RATE: 20 resp/min
pH, Arterial: 6.811 — CL (ref 7.350–7.450)
pO2, Arterial: 116 mmHg — ABNORMAL HIGH (ref 83.0–108.0)

## 2017-11-18 LAB — APTT: APTT: 31 s (ref 24–36)

## 2017-11-18 LAB — ACETAMINOPHEN LEVEL

## 2017-11-18 LAB — BRAIN NATRIURETIC PEPTIDE: B Natriuretic Peptide: 1406 pg/mL — ABNORMAL HIGH (ref 0.0–100.0)

## 2017-11-18 LAB — TROPONIN I
TROPONIN I: 1.18 ng/mL — AB (ref <0.03)
Troponin I: 0.32 ng/mL (ref <0.03)

## 2017-11-18 LAB — RAPID URINE DRUG SCREEN, HOSP PERFORMED
Amphetamines: NOT DETECTED
BARBITURATES: NOT DETECTED
Benzodiazepines: POSITIVE — AB
Cocaine: NOT DETECTED
OPIATES: POSITIVE — AB
Tetrahydrocannabinol: NOT DETECTED

## 2017-11-18 LAB — PREPARE RBC (CROSSMATCH)

## 2017-11-18 LAB — ABO/RH: ABO/RH(D): O POS

## 2017-11-18 LAB — MAGNESIUM
MAGNESIUM: 3.1 mg/dL — AB (ref 1.7–2.4)
Magnesium: 2.6 mg/dL — ABNORMAL HIGH (ref 1.7–2.4)

## 2017-11-18 LAB — ETHANOL: Alcohol, Ethyl (B): <10 mg/dL (ref <10)

## 2017-11-18 LAB — CORTISOL: CORTISOL PLASMA: 57 ug/dL

## 2017-11-18 LAB — PROCALCITONIN: PROCALCITONIN: 4.08 ng/mL

## 2017-11-18 LAB — LACTIC ACID, PLASMA: Lactic Acid, Venous: 1.5 mmol/L (ref 0.5–1.9)

## 2017-11-18 LAB — CBG MONITORING, ED: Glucose-Capillary: 520 mg/dL (ref 65–99)

## 2017-11-18 LAB — AMMONIA: AMMONIA: 103 umol/L — AB (ref 9–35)

## 2017-11-18 LAB — PROTIME-INR
INR: 1.57
Prothrombin Time: 18.6 seconds — ABNORMAL HIGH (ref 11.4–15.2)

## 2017-11-18 LAB — SALICYLATE LEVEL

## 2017-11-18 MED ORDER — ORAL CARE MOUTH RINSE
15.0000 mL | Freq: Four times a day (QID) | OROMUCOSAL | Status: DC
  Administered 2017-11-19 – 2017-11-24 (×18): 15 mL via OROMUCOSAL

## 2017-11-18 MED ORDER — NALOXONE HCL 2 MG/2ML IJ SOSY
PREFILLED_SYRINGE | INTRAMUSCULAR | Status: AC | PRN
  Administered 2017-11-18: 1 mg via INTRAVENOUS

## 2017-11-18 MED ORDER — SODIUM CHLORIDE 0.9 % IV SOLN
1.0000 g | Freq: Once | INTRAVENOUS | Status: AC
  Administered 2017-11-18: 1 g via INTRAVENOUS
  Filled 2017-11-18: qty 10

## 2017-11-18 MED ORDER — HEPARIN SODIUM (PORCINE) 5000 UNIT/ML IJ SOLN
5000.0000 [IU] | Freq: Three times a day (TID) | INTRAMUSCULAR | Status: DC
  Administered 2017-11-18 – 2017-11-21 (×8): 5000 [IU] via SUBCUTANEOUS
  Filled 2017-11-18 (×9): qty 1

## 2017-11-18 MED ORDER — VASOPRESSIN 20 UNIT/ML IV SOLN
0.0300 [IU]/min | INTRAVENOUS | Status: DC
  Filled 2017-11-18: qty 2

## 2017-11-18 MED ORDER — ETOMIDATE 2 MG/ML IV SOLN
INTRAVENOUS | Status: AC | PRN
  Administered 2017-11-18: 20 mg via INTRAVENOUS

## 2017-11-18 MED ORDER — DEXTROSE 50 % IV SOLN
25.0000 mL | INTRAVENOUS | Status: DC | PRN
  Administered 2017-11-20 (×2): 25 mL via INTRAVENOUS
  Filled 2017-11-18 (×2): qty 50

## 2017-11-18 MED ORDER — INSULIN ASPART 100 UNIT/ML ~~LOC~~ SOLN
SUBCUTANEOUS | Status: AC
  Filled 2017-11-18: qty 1

## 2017-11-18 MED ORDER — SODIUM CHLORIDE 0.9 % IV SOLN: INTRAVENOUS | Status: DC | PRN

## 2017-11-18 MED ORDER — SODIUM POLYSTYRENE SULFONATE 15 GM/60ML PO SUSP
30.0000 g | Freq: Once | ORAL | Status: AC
  Administered 2017-11-18: 30 g
  Filled 2017-11-18: qty 120

## 2017-11-18 MED ORDER — CHLORHEXIDINE GLUCONATE 0.12% ORAL RINSE (MEDLINE KIT)
15.0000 mL | Freq: Two times a day (BID) | OROMUCOSAL | Status: DC
  Administered 2017-11-18 – 2017-11-23 (×9): 15 mL via OROMUCOSAL

## 2017-11-18 MED ORDER — NALOXONE HCL 2 MG/2ML IJ SOSY
PREFILLED_SYRINGE | INTRAMUSCULAR | Status: AC
  Filled 2017-11-18: qty 2

## 2017-11-18 MED ORDER — NOREPINEPHRINE BITARTRATE 1 MG/ML IV SOLN
0.0000 ug/min | INTRAVENOUS | Status: DC
  Administered 2017-11-18: 10 ug/min via INTRAVENOUS
  Filled 2017-11-18 (×2): qty 4

## 2017-11-18 MED ORDER — SODIUM CHLORIDE 0.9 % IV SOLN
INTRAVENOUS | Status: DC
  Administered 2017-11-18: 20:00:00 via INTRAVENOUS
  Administered 2017-11-18: 125 mL/h via INTRAVENOUS
  Administered 2017-11-19: 01:00:00 via INTRAVENOUS

## 2017-11-18 MED ORDER — SODIUM CHLORIDE 0.9 % IV BOLUS
1000.0000 mL | Freq: Once | INTRAVENOUS | Status: AC
  Administered 2017-11-18: 1000 mL via INTRAVENOUS

## 2017-11-18 MED ORDER — INSULIN ASPART 100 UNIT/ML ~~LOC~~ SOLN
10.0000 [IU] | Freq: Once | SUBCUTANEOUS | Status: AC
  Administered 2017-11-18: 10 [IU] via INTRAVENOUS

## 2017-11-18 MED ORDER — SODIUM BICARBONATE 8.4 % IV SOLN
INTRAVENOUS | Status: AC
  Administered 2017-11-18: 100 meq via INTRAVENOUS
  Filled 2017-11-18: qty 100

## 2017-11-18 MED ORDER — PANTOPRAZOLE SODIUM 40 MG IV SOLR
40.0000 mg | Freq: Every day | INTRAVENOUS | Status: DC
  Administered 2017-11-18: 40 mg via INTRAVENOUS
  Filled 2017-11-18: qty 40

## 2017-11-18 MED ORDER — SODIUM CHLORIDE 0.9 % IV SOLN
250.0000 mL | INTRAVENOUS | Status: DC | PRN
  Administered 2017-11-20: 250 mL via INTRAVENOUS

## 2017-11-18 MED ORDER — ORAL CARE MOUTH RINSE: 15.0000 mL | OROMUCOSAL | Status: DC

## 2017-11-18 MED ORDER — FAMOTIDINE IN NACL 20-0.9 MG/50ML-% IV SOLN
20.0000 mg | Freq: Two times a day (BID) | INTRAVENOUS | Status: DC
  Filled 2017-11-18: qty 50

## 2017-11-18 MED ORDER — DEXTROSE-NACL 5-0.45 % IV SOLN: INTRAVENOUS | Status: DC

## 2017-11-18 MED ORDER — SODIUM CHLORIDE 0.9 % IV SOLN: 10.0000 mL/h | Freq: Once | INTRAVENOUS | Status: DC

## 2017-11-18 MED ORDER — SODIUM CHLORIDE 0.9 % IV SOLN
INTRAVENOUS | Status: AC
  Filled 2017-11-18: qty 1

## 2017-11-18 MED ORDER — ASPIRIN 300 MG RE SUPP: 300.0000 mg | RECTAL | Status: AC

## 2017-11-18 MED ORDER — NOREPINEPHRINE BITARTRATE 1 MG/ML IV SOLN
0.0000 ug/min | INTRAVENOUS | Status: DC
  Administered 2017-11-18: 15 ug/min via INTRAVENOUS
  Filled 2017-11-18: qty 16

## 2017-11-18 MED ORDER — DEXTROSE 5 % IV SOLN
INTRAVENOUS | Status: DC
  Administered 2017-11-18: 23:00:00 via INTRAVENOUS
  Filled 2017-11-18 (×3): qty 150

## 2017-11-18 MED ORDER — LACTULOSE 10 GM/15ML PO SOLN
20.0000 g | Freq: Three times a day (TID) | ORAL | Status: DC
  Administered 2017-11-18: 20 g
  Filled 2017-11-18 (×2): qty 30

## 2017-11-18 MED ORDER — ASPIRIN 81 MG PO CHEW
324.0000 mg | CHEWABLE_TABLET | ORAL | Status: AC
  Administered 2017-11-18: 324 mg via ORAL
  Filled 2017-11-18: qty 4

## 2017-11-18 MED ORDER — NOREPINEPHRINE 4 MG/250ML-% IV SOLN
INTRAVENOUS | Status: AC
Start: 2017-11-18 — End: 2017-11-18
  Filled 2017-11-18: qty 250

## 2017-11-18 MED ORDER — SODIUM CHLORIDE 0.9 % IV SOLN
Freq: Once | INTRAVENOUS | Status: AC
  Administered 2017-11-18: via INTRAVENOUS

## 2017-11-18 MED ORDER — SODIUM CHLORIDE 0.9 % IV SOLN
INTRAVENOUS | Status: AC | PRN
  Administered 2017-11-18: 500 mL via INTRAVENOUS

## 2017-11-18 MED ORDER — SODIUM BICARBONATE 8.4 % IV SOLN
100.0000 meq | Freq: Once | INTRAVENOUS | Status: AC
  Administered 2017-11-18 (×2): 100 meq via INTRAVENOUS
  Filled 2017-11-18: qty 100

## 2017-11-18 MED ORDER — CHLORHEXIDINE GLUCONATE 0.12% ORAL RINSE (MEDLINE KIT): 15.0000 mL | Freq: Two times a day (BID) | OROMUCOSAL | Status: DC

## 2017-11-18 MED ORDER — SUCCINYLCHOLINE CHLORIDE 20 MG/ML IJ SOLN
INTRAMUSCULAR | Status: AC | PRN
  Administered 2017-11-18: 100 mg via INTRAVENOUS

## 2017-11-18 MED ORDER — SODIUM CHLORIDE 0.9 % IV SOLN
INTRAVENOUS | Status: DC
  Administered 2017-11-18: 5.4 [IU]/h via INTRAVENOUS
  Administered 2017-11-19: 26.7 [IU]/h via INTRAVENOUS
  Filled 2017-11-18 (×3): qty 1

## 2017-11-18 NOTE — H&P (Addendum)
.. ..  Name: JAELA YEPEZ MRN: [id number] DOB: 10-26-1950    ADMISSION DATE:  11/18/2017  CONSULTATION DATE:  11/18/17  REFERRING MD :  Lita Mains MD  CHIEF COMPLAINT:  Respiratory arrest, DKA  BRIEF PATIENT DESCRIPTION: 67 yr old female with IDDM, Diastolic dysfunction, hypothyroidism s.p lumbar fusion discharged on 4/12. Presents after respiratory arrest from [facility] intubated with AG 29 BS>600 and ph 6.8  SIGNIFICANT EVENTS  resp arrest Intubated Started on Levophed  STUDIES:  CXR ABG   HISTORY OF PRESENT ILLNESS:  ( due to pt's metabolic encephalopathy and intubated status History was obtained from the accounts of other providers, EMR, and family present at time of decline)   67 yr old female with PMHx significant for IDDM( has had DKA in the past), GERD, Diastolic dysfunction, Mild AI, CAD, Vitamin D deficiency, Hypothyroidism, HTN, Hepatic steatosis, spondylolisthesis of lumbar region and cervical spondylosis with radiculopathy and h/o depression s/p recent lumbar fusion surgery on 4/12 and was discharged home with Oxy IR. Family believes she took more than prescribed dose.  Per the family's accounts pt became diaphoretic with labored breathing and decompensated at 1pm on 4/13.  Her BG was >600. She was transported via EMS who administered Narcan but received minimal response. Pt became apneic en route to the hospital and on arrival to [facility] she was intubated and placed on mechanical ventilation. PCCM consulted for transfer and mgmt. Accepted by daytime PCCM Debbora Dus   PAST MEDICAL HISTORY :   has a past medical history of Anemia, Arthritis, Blood transfusion, Cervical spondylosis with myelopathy and radiculopathy, Depression, Diabetes mellitus, Diastolic dysfunction, Diverticulitis, DJD (degenerative joint disease), DVT (deep venous thrombosis) ([place]), Endometriosis, Family history of adverse reaction to anesthesia, Fibromyalgia, GERD (gastroesophageal reflux disease), Hiatal  hernia, History of kidney stones, Hyperlipidemia, Hypertension, Hypothyroidism, Insomnia, Mild aortic insufficiency, Osteoarthritis, Ovarian cyst, PONV (postoperative nausea and vomiting), RLS (restless legs syndrome), Thoracic compression fracture ([place]), Uterine fibroid, UTI (lower urinary tract infection), Villous adenoma of right colon (04/08/2016), and Vitamin D deficiency disease.  has a past surgical history that includes Cholecystectomy; Appendectomy; orthopedic surgeries; Cesarean section; Knee Arthroplasty (09); Knee Arthroplasty; Shoulder open rotator cuff repair; Breast surgery; Dorsal compartment release (06/05/2012); Carpal tunnel release (06/05/2012); Back surgery (,2005); Tubal ligation; Cardiac catheterization (04/2013); Eye surgery; Colectomy (04/08/2016); Laparoscopic partial colectomy (N/A, 04/08/2016); Dilation and curettage of uterus; Joint replacement; Anterior cervical decomp/discectomy fusion (N/A, 03/23/2017); and Reduction mammaplasty (Bilateral). Prior to Admission medications   Medication Sig Start Date End Date Taking? Authorizing Provider  acetaminophen (TYLENOL) 500 MG tablet Take 1,000 mg by mouth every 8 (eight) hours as needed for mild pain or headache.   Yes [provider]  amLODipine (NORVASC) 5 MG tablet Take 5 mg by mouth daily.   Yes [provider]  aspirin 81 MG chewable tablet Chew 81 mg by mouth daily.   Yes [provider]  clobetasol (TEMOVATE) 0.05 % external solution Apply 1 application topically See admin instructions. Apply twice daily to scalp and ear as needed for itching/rash (do not apply to face/groin/underarms) 07/13/16  Yes [provider]  Cyanocobalamin (B-12) 2500 MCG TABS Take 2,500 mcg by mouth daily.   Yes [provider]  cyclobenzaprine (FLEXERIL) 10 MG tablet Take 1 tablet (10 mg total) by mouth 3 (three) times daily as needed for muscle spasms. 03/24/17  Yes Costella, Vista Mink, PA-C  docusate sodium  (COLACE) 100 MG capsule Take 1 capsule (100 mg total) by mouth 2 (two) times daily. 11/17/17  Yes Newman Pies, MD  doxycycline (VIBRA-TABS) 100 MG tablet Take 100 mg by mouth 2 (two) times daily as needed. For scalp/skin breakdowns. 10/10/17  Yes [provider]  DULoxetine (CYMBALTA) 60 MG capsule Take 120 mg by mouth daily.    Yes [provider]  ferrous sulfate 325 (65 FE) MG tablet Take 1 tablet (325 mg total) by mouth 2 (two) times daily with a meal. 11/17/17  Yes Newman Pies, MD  fluticasone [facility]) 50 MCG/ACT nasal spray Place 2 sprays into both nostrils daily as needed for allergies or rhinitis.   Yes [provider]  furosemide (LASIX) 20 MG tablet Take 20 mg by mouth daily as needed for fluid or edema.    Yes [provider]  JARDIANCE 10 MG TABS tablet Take 10 mg by mouth daily. 07/18/17  Yes [provider]  levothyroxine (SYNTHROID, LEVOTHROID) 150 MCG tablet Take 150 mcg by mouth daily before breakfast. 10/10/17  Yes [provider]  NOVOLIN 70/30 RELION (70-30) 100 UNIT/ML injection Inject 50 Units into the skin 2 (two) times daily. 07/12/17  Yes [provider]  nystatin (MYCOSTATIN) 100000 UNIT/ML suspension Use as directed 5 mLs in the mouth or throat 4 (four) times daily as needed (for tongue burning/mouth sores.).  08/22/17  Yes [provider]  olmesartan (BENICAR) 40 MG tablet Take 40 mg by mouth daily. 08/22/17  Yes [provider]  oxyCODONE (OXY IR/ROXICODONE) 5 MG immediate release tablet Take 1 tablet (5 mg total) by mouth every 4 (four) hours as needed for moderate pain ((score 4 to 6)). 11/17/17  Yes Newman Pies, MD  pantoprazole (PROTONIX) 40 MG tablet Take 40 mg by mouth 2 (two) times daily.   Yes [provider]  Probiotic Product (PROBIOTIC PO) Take 1 capsule by mouth daily.   Yes [provider]  promethazine (PHENERGAN) 25 MG tablet Take 25 mg by mouth every 8  (eight) hours as needed for nausea or vomiting.   Yes [provider]  temazepam (RESTORIL) 15 MG capsule Take 30 mg by mouth at bedtime. 10/10/17  Yes [provider]   Allergies  Allergen Reactions  . Codeine Nausea And Vomiting  . Ambien [Zolpidem Tartrate] Other (See Comments)    Up sleep walking and eating  . Crestor [Rosuvastatin] Other (See Comments)    Muscle weakness, cramps and aching all over body  . Lipitor [Atorvastatin] Itching and Other (See Comments)    Muscle weakness, cramps and aches all over body  . Morphine And Related Other (See Comments)    Flushing and feeling hot Tolerates with Diphenhydramine  . Penicillins Rash and Other (See Comments)    Caused Headaches also Has patient had a PCN reaction causing immediate rash, facial/tongue/throat swelling, SOB or lightheadedness with hypotension: No Has patient had a PCN reaction causing severe rash involving mucus membranes or skin necrosis: No Has patient had a PCN reaction that required hospitalization No Has patient had a PCN reaction occurring within the last 10 years: No If all of the above answers are "NO", then may proceed with Cephalosporin use.    . Statins Other (See Comments)    Muscle weakness, cramps and aching all over body  . Dilaudid [Hydromorphone Hcl] Other (See Comments)    Hallucinations/ argumentative, goes beserk  . Amitriptyline Other (See Comments)    Loopy feeling  . Ceftin [Cefuroxime Axetil] Other (See Comments)    Stomach pain   . Lovaza [Omega-3-Acid Ethyl Esters] Other (See Comments)  Stomach pain   . Lunesta [Eszopiclone] Other (See Comments)    Causes dizziness  . Lyrica [Pregabalin] Nausea Only  . Metformin And Related Nausea Only    Pt denies this intolerance    FAMILY HISTORY:  family history includes Breast cancer in her cousin, cousin, maternal aunt, and maternal aunt; Breast cancer (age of onset: 60) in her mother; Diabetes type II in her father and  mother; Heart attack in her father; Hypertension in her mother; Hypothyroidism in her brother and father. SOCIAL HISTORY:  reports that she has never smoked. She has never used smokeless tobacco. She reports that she does not drink alcohol or use drugs.  REVIEW OF SYSTEMS:  ( unable to obtain due to metabolic encephalopathy, and pt is intubated) Constitutional: Negative for fever, chills, weight loss, malaise/fatigue and diaphoresis.  HENT: Negative for hearing loss, ear pain, nosebleeds, congestion, sore throat, neck pain, tinnitus and ear discharge.   Eyes: Negative for blurred vision, double vision, photophobia, pain, discharge and redness.  Respiratory: Negative for cough, hemoptysis, sputum production, shortness of breath, wheezing and stridor.   Cardiovascular: Negative for chest pain, palpitations, orthopnea, claudication, leg swelling and PND.  Gastrointestinal: Negative for heartburn, nausea, vomiting, abdominal pain, diarrhea, constipation, blood in stool and melena.  Genitourinary: Negative for dysuria, urgency, frequency, hematuria and flank pain.  Musculoskeletal: Negative for myalgias, back pain, joint pain and falls.  Skin: Negative for itching and rash.  Neurological: Negative for dizziness, tingling, tremors, sensory change, speech change, focal weakness, seizures, loss of consciousness, weakness and headaches.  Endo/Heme/Allergies: Negative for environmental allergies and polydipsia. Does not bruise/bleed easily.  SUBJECTIVE:   VITAL SIGNS: Temp:  [95.5 F (35.3 C)-98.1 F (36.7 C)] 98.1 F (36.7 C) (04/13 2015) Pulse Rate:  [86-108] 94 (04/13 2015) Resp:  [13-34] 32 (04/13 2015) BP: (65-124)/(25-88) 101/38 (04/13 2010) SpO2:  [86 %-100 %] 100 % (04/13 2015) FiO2 (%):  [100 %] 100 % (04/13 1935) Weight:  [92.1 kg (203 lb)] 92.1 kg (203 lb) (04/13 1519)  PHYSICAL EXAMINATION: General:  intubated Neuro:  GCS 3 HEENT:  NCAT ETT and OGT in oropharynx Cardiovascular:   S1 and S2 appreciated Lungs:  Decreased BS bilat Abdomen:  Soft NT + BS Musculoskeletal:  No gross deformities. Surgical site dry clean and intact Skin:  Grossly intact  Recent Labs  Lab 11/16/17 0635 11/18/17 1528  NA 134* 133*  K 4.7 6.9*  CL 103 100*  CO2 19* 4*  BUN 16 54*  CREATININE 1.13* 2.99*  GLUCOSE 378* 741*   Recent Labs  Lab 11/16/17 0635 11/17/17 0617 11/18/17 1528  HGB 7.3* 7.3* 6.8*  HCT 26.1* 26.6* 27.2*  WBC 9.0 13.1* 29.3*  PLT 320 393 708*   Ct Head Wo Contrast  Result Date: 11/18/2017 CLINICAL DATA:  Altered level of consciousness EXAM: CT HEAD WITHOUT CONTRAST TECHNIQUE: Contiguous axial images were obtained from the base of the skull through the vertex without intravenous contrast. COMPARISON:  MRI brain dated 01/02/2015 FINDINGS: Motion degraded images. Brain: No evidence of acute infarction, hemorrhage, hydrocephalus, extra-axial collection or mass lesion/mass effect. Mild small vessel ischemic changes. Vascular: No hyperdense vessel or unexpected calcification. Skull: Normal. Negative for fracture or focal lesion. Sinuses/Orbits: Partial opacification/mucosal thickening of the right maxillary sinus. Mastoid air cells are clear. Other: None. IMPRESSION: Motion degraded images. No evidence of acute intracranial abnormality. Mild small vessel ischemic changes. Electronically Signed   By: Julian Hy M.D.   On: 11/18/2017 16:56   Dg Chest  Portable 1 View  Result Date: 11/18/2017 CLINICAL DATA:  67 year old female intubated for respiratory distress. Postoperative day 3 lumbar spine surgery. EXAM: PORTABLE CHEST 1 VIEW COMPARISON:  Portable chest 03/05/2017. FINDINGS: Portable AP supine view at 1544 hours. Endotracheal tube tip is in good position between the level of the clavicles and carina. Lung volumes are within normal limits. Mediastinal contours are stable and within normal limits no pneumothorax or pleural effusion. Allowing for portable technique  the lungs are clear. Negative visible bowel gas pattern. Cervical ACDF hardware. No acute osseous abnormality identified. IMPRESSION: 1. Endotracheal tube tip in good position. 2.  No acute cardiopulmonary abnormality identified. Electronically Signed   By: Genevie Ann M.D.   On: 11/18/2017 15:55    ASSESSMENT / PLAN: NEURO: Acute metabolic encephalopathy Multifactorial etiology Hyperammonemia- last Ammonia level 103 Started on Lactulose- ? Unsure if pt has liver dysfn (only h/o hepatic steatosis) AGMA secondary to Diabetic Ketoacidosis - continues  On insulin, IVF  Started on bicarb  ggt Acute Kidney Injury - uremia BUN 54 GCS 3 RASS goal 0 to -1 ICU Pain and sedation protocol   CARDIAC: Hypovolemic vs Septic Shock ( h/o diastolic dsyfunction, Mild AI, HTN and s/p cardiac cath in 2014) Levophed at 11 Pt in severe metabolic acidosis Place A-line for more acurate BP monitoring Started pt on bicarb ggt due to low ph And treating DKA to reverse acidosis MAP goal >27mmhG ECHO to assess LVEF Trend troponin x3 Get CVP and Mixed venous to guide mgmt of shock Get cortisol .Marland KitchenCardiac Panel (last 3 results) Recent Labs    11/18/17 1528  TROPONINI 0.32*     PULMONARY: Acute Respiratory failure with hypoxia and hypercarbia Intubation at [facility].  Continues on PRVC TV 8cc/kg RR >20 currently MVe 20L Repeat ABG pending Pt will need A-line for frequent ABG monitoring Last ABG ph 6.8 Severe metabolic acidosis - started on IVF, insulin and bicarbonate  ID: Recent Lumbar fusion Leukocytosis noted  Remains afebrile UA unremarkable for any signs of infection No frank consolidations on CXR Surgical site does not appear infected Will check Lactate, PCT Trend WBC and fever curve Not currently on antibiotics. Will start on Zosyn IV per pharm post blood cx x 2 being obtained  Endocrine: Diabetic Ketoacidosis ( U/A + ketones - send betaOH butyrate) Continues on insulin ggt glucose  stabilizer 0.9 NS IVF at 125 untiol BG <250 AG 29 on last labs Ph  On last gas 6.8 started on bicarb ggt H/o hypothyroidism on Levothyroxine 132mcg daily current TSH 0.265 Will not restart levothyroxine at this time. will check Cortiosl level  GI: Pt has a h/o subtotal right hemicolectomy and mild sigmoid diverticulosis Last CT A/P was in April 2018. NPO OGT in place no coffee grounds noted Not on TF  PPI for prophylaxis  Elevated Ammonia level with slightly decreased albumin at 3 coags INR 1.57- has a h/o hepatic steatosis On previous scan pt also had a 0.9 cm hypodense subcapsular inferior right liver lobe lesion.  Started on lactulose  Heme: Microcytic Anemia- pt has a h/o chronic anemia Hgb 6.8 Pt has polychromasia ( indicating elevated retic and appropiate response to anemia) [facility] ordered 2 uPRBCs but pt was not transfused Confirm Type & screen here and transfuse 2 u PRBCs Check Iron studies (TIBC etc) If Hgb<7 transfuse PRBCs No signs of active bleeding  Plts 708- Reactive thrombocytosis  DVT PPx Hep [place] and SCDs  RENAL GFR 15 Acute on Chronic Kidney disease Baseline Cr  0.99 in August 2018 On 11/07/17 Creatinine 1.06 ( elevated from baseline) Appears to be  A more acute decline Uremia BUN 54 Monitor UOP place Indwelling foley cath If acidosis refractory we may need RRT  Lab Results  Component Value Date   CREATININE 2.99 (H) 11/18/2017   CREATININE 1.13 (H) 11/16/2017   CREATININE 1.06 (H) 11/07/2017  AGMA secondary to DKA Continues on insulin ggt and iVF Last BG was 700s AG 29  BMET Q 4 Started on bicarb due to ph <7.1  Pseudohyperkalemia secondary to DKA Continue insulin ggt and IVF Pt received kayexalate at outside facility As DKA improves potassium should trend down Will continue to monitor for EKG changes Will check Mg and Phos      I, Dr Seward Carol have personally reviewed patient's available data, including medical history, events of  note, physical examination and test results as part of my evaluation. I have discussed with resident/NP and other care providers such as pharmacist, RN and NP. The patient is critically ill with multiple organ systems failure and requires high complexity decision making for assessment and support, frequent evaluation and titration of therapies, application of advanced monitoring technologies and extensive interpretation of multiple databases. Critical Care Time devoted to patient care services described in this note is 65 Minutes. This time reflects time of care of this signee Dr Seward Carol. This critical care time does not reflect procedure time, or teaching time or supervisory time of NP but could involve care discussion time    DISPOSITION: ICU CC TIME: 65 mins PROGNOSIS: Guarded FAMILY: husband and son at bedside at time of evaluation CODE STATUS: FULL   Signed Dr Seward Carol Pulmonary Critical Care Locums  11/18/2017, 8:35 PM

## 2017-11-18 NOTE — ED Notes (Signed)
CRITICAL VALUE ALERT  Critical Value:  HBG:6.8  Date & Time Notied:  11/18/2017 1600  Provider Notified: Lita Mains @1600 

## 2017-11-18 NOTE — Procedures (Signed)
Arterial Catheter Insertion Procedure Note [name] [id number] 12/06/1950  Procedure: Insertion of Arterial Catheter  Indications: Blood pressure monitoring and Frequent blood sampling  Procedure Details Consent: Unable to obtain consent because of emergent medical necessity. Time Out: Verified patient identification, verified procedure, site/side was marked, verified correct patient position, special equipment/implants available, medications/allergies/relevent history reviewed, required imaging and test results available.  Performed  Maximum sterile technique was used including antiseptics, cap, gloves, gown, hand hygiene, mask and sheet. Skin prep: Chlorhexidine; local anesthetic administered 22 gauge catheter was inserted into left femoral artery using the Seldinger technique. ULTRASOUND GUIDANCE USED: YES Evaluation Blood flow good; BP tracing good. Complications: No apparent complications.   [name] 11/18/2017

## 2017-11-18 NOTE — ED Notes (Signed)
Placed on vent via RT. 95% on vent

## 2017-11-18 NOTE — ED Triage Notes (Signed)
Per EMS they were called out for AMS, pt GF was >600 and pt was non-responsive, Narcan given with minimal reaction. Pt stopped breathing in route

## 2017-11-18 NOTE — ED Notes (Signed)
Arterial values called in by Respiratory, MD made aware.

## 2017-11-18 NOTE — ED Notes (Signed)
Bair hugger applied.

## 2017-11-18 NOTE — ED Provider Notes (Signed)
[facility] EMERGENCY DEPARTMENT Provider Note   CSN: [id number] Arrival date & time: 11/18/17  1513     History   Chief Complaint Chief Complaint  Patient presents with  . Respiratory Arrest    HPI [name] is a 67 y.o. female.  HPI Level 5 caveat due to unresponsiveness.  Patient brought in by EMS after being called for altered mental status.  Per family patient became unresponsive when they went to check on her at 1:00 this afternoon.  Noted to have labored breathing and be diaphoretic.  Glucose greater than 600.  Patient was discharged yesterday after lumbar fusion surgery.  Postoperative course complicated with hypo-and hypoglycemia.  Patient also noted to be chronically anemic with hemoglobin of 7.  Patient was discharged home with OxyIR.  Family suspect that she may have taken more than prescribed dose.  They deny that she was complaining of the shortness of breath or chest pain. Past Medical History:  Diagnosis Date  . Anemia    iron deficiency  . Arthritis   . Blood transfusion   . Cervical spondylosis with myelopathy and radiculopathy   . Depression   . Diabetes mellitus    Type II  . Diastolic dysfunction   . Diverticulitis   . DJD (degenerative joint disease)   . DVT (deep venous thrombosis) (HCC)    times 2 lower leg  . Endometriosis   . Family history of adverse reaction to anesthesia    mom and dad PONV  . Fibromyalgia   . GERD (gastroesophageal reflux disease)    occ  . Hiatal hernia   . History of kidney stones   . Hyperlipidemia   . Hypertension   . Hypothyroidism   . Insomnia   . Mild aortic insufficiency    by echo 04/2013  . Osteoarthritis    back and knee  . Ovarian cyst   . PONV (postoperative nausea and vomiting)   . RLS (restless legs syndrome)   . Thoracic compression fracture ([place])   . Uterine fibroid   . UTI (lower urinary tract infection)   . Villous adenoma of right colon 04/08/2016  . Vitamin D deficiency disease      Patient Active Problem List   Diagnosis Date Noted  . DKA (diabetic ketoacidoses) ([place]) 11/18/2017  . Spondylolisthesis of lumbar region 11/15/2017  . PAC (premature atrial contraction) 09/04/2017  . Cervical spondylosis with radiculopathy 03/23/2017  . Hypoxia   . Villous adenoma of right colon 04/08/2016  . Leukocytosis 04/08/2016  . Anemia   . Diastolic dysfunction   . Mild aortic insufficiency   . Other nonspecific abnormal cardiovascular system function study 05/03/2013  . SOB (shortness of breath) 05/01/2013  . Atypical chest pain 02/28/2013  . Dysphagia 02/28/2013  . UTI (lower urinary tract infection) 07/06/2012  . Intractable nausea and vomiting 07/06/2012  . Blood loss anemia 07/06/2012  . Acute kidney injury ([place]) 07/06/2012  . Epigastric abdominal pain 06/27/2012  . Type II diabetes mellitus ([place]) 07/28/2011  . Hypothyroidism 07/28/2011  . Hypertension 07/28/2011  . Hepatic steatosis 07/28/2011    Past Surgical History:  Procedure Laterality Date  . ANTERIOR CERVICAL DECOMP/DISCECTOMY FUSION N/A 03/23/2017   Procedure: ANTERIOR CERVICAL DECOMPRESSION/DISCECTOMY FUSION, INTERBODY PROSTHESIS,PLATE CERVICAL THREE- CERVICAL FOUR, CERVICAL FOUR- CERVICAL FIVE, CERVICAL FIVE- CERVICAL SIX;  Surgeon: Newman Pies, MD;  Location: [facility];  Service: Neurosurgery;  Laterality: N/A;  . APPENDECTOMY    . BACK SURGERY  ,2005   April  2013 - spinal  fusion@ [facility]  . BREAST SURGERY     breast reduction  . CARDIAC CATHETERIZATION  04/2013   normal coronary arteries and normal LVF  . CARPAL TUNNEL RELEASE  06/05/2012   Procedure: CARPAL TUNNEL RELEASE;  Surgeon: Cammie Sickle., MD;  Location: [facility];  Service: Orthopedics;  Laterality: Left;  . CESAREAN SECTION     x 2  . CHOLECYSTECTOMY    . COLECTOMY  04/08/2016  . DILATION AND CURETTAGE OF UTERUS    . DORSAL COMPARTMENT RELEASE  06/05/2012   Procedure: RELEASE DORSAL COMPARTMENT (DE[place]ERVAIN);   Surgeon: Cammie Sickle., MD;  Location: [facility];  Service: Orthopedics;  Laterality: Left;  Excision of mixoid cyst also  . EYE SURGERY     cataracts bilateral  . JOINT REPLACEMENT     Bilateral knee  . KNEE ARTHROPLASTY  09   lft partial  . KNEE ARTHROPLASTY     rt  . LAPAROSCOPIC PARTIAL COLECTOMY N/A 04/08/2016   Procedure: LAPAROSCOPIC ASSISTED ASCENDING COLECTOMY POSSIBLE OPEN COLECTOMY;  Surgeon: Fanny Skates, MD;  Location: [place];  Service: General;  Laterality: N/A;  . orthopedic surgeries     multiple  . REDUCTION MAMMAPLASTY Bilateral   . SHOULDER OPEN ROTATOR CUFF REPAIR     rt and lft  . TUBAL LIGATION     btsp     OB History    Gravida  2   Para  2   Term  2   Preterm      AB      Living  2     SAB      TAB      Ectopic      Multiple      Live Births               Home Medications    Prior to Admission medications   Medication Sig Start Date End Date Taking? Authorizing Provider  acetaminophen (TYLENOL) 500 MG tablet Take 1,000 mg by mouth every 8 (eight) hours as needed for mild pain or headache.   Yes [provider]  amLODipine (NORVASC) 5 MG tablet Take 5 mg by mouth daily.   Yes [provider]  aspirin 81 MG chewable tablet Chew 81 mg by mouth daily.   Yes [provider]  clobetasol (TEMOVATE) 0.05 % external solution Apply 1 application topically See admin instructions. Apply twice daily to scalp and ear as needed for itching/rash (do not apply to face/groin/underarms) 07/13/16  Yes [provider]  Cyanocobalamin (B-12) 2500 MCG TABS Take 2,500 mcg by mouth daily.   Yes [provider]  cyclobenzaprine (FLEXERIL) 10 MG tablet Take 1 tablet (10 mg total) by mouth 3 (three) times daily as needed for muscle spasms. 03/24/17  Yes Costella, Vista Mink, PA-C  docusate sodium (COLACE) 100 MG capsule Take 1 capsule (100 mg total) by mouth 2 (two) times daily. 11/17/17  Yes  Newman Pies, MD  doxycycline (VIBRA-TABS) 100 MG tablet Take 100 mg by mouth 2 (two) times daily as needed. For scalp/skin breakdowns. 10/10/17  Yes [provider]  DULoxetine (CYMBALTA) 60 MG capsule Take 120 mg by mouth daily.    Yes [provider]  ferrous sulfate 325 (65 FE) MG tablet Take 1 tablet (325 mg total) by mouth 2 (two) times daily with a meal. 11/17/17  Yes Newman Pies, MD  fluticasone [facility]) 50 MCG/ACT nasal spray Place 2 sprays into both nostrils  daily as needed for allergies or rhinitis.   Yes [provider]  furosemide (LASIX) 20 MG tablet Take 20 mg by mouth daily as needed for fluid or edema.    Yes [provider]  JARDIANCE 10 MG TABS tablet Take 10 mg by mouth daily. 07/18/17  Yes [provider]  levothyroxine (SYNTHROID, LEVOTHROID) 150 MCG tablet Take 150 mcg by mouth daily before breakfast. 10/10/17  Yes [provider]  NOVOLIN 70/30 RELION (70-30) 100 UNIT/ML injection Inject 50 Units into the skin 2 (two) times daily. 07/12/17  Yes [provider]  nystatin (MYCOSTATIN) 100000 UNIT/ML suspension Use as directed 5 mLs in the mouth or throat 4 (four) times daily as needed (for tongue burning/mouth sores.).  08/22/17  Yes [provider]  olmesartan (BENICAR) 40 MG tablet Take 40 mg by mouth daily. 08/22/17  Yes [provider]  oxyCODONE (OXY IR/ROXICODONE) 5 MG immediate release tablet Take 1 tablet (5 mg total) by mouth every 4 (four) hours as needed for moderate pain ((score 4 to 6)). 11/17/17  Yes Newman Pies, MD  pantoprazole (PROTONIX) 40 MG tablet Take 40 mg by mouth 2 (two) times daily.   Yes [provider]  Probiotic Product (PROBIOTIC PO) Take 1 capsule by mouth daily.   Yes [provider]  promethazine (PHENERGAN) 25 MG tablet Take 25 mg by mouth every 8 (eight) hours as needed for nausea or vomiting.   Yes [provider]  temazepam  (RESTORIL) 15 MG capsule Take 30 mg by mouth at bedtime. 10/10/17  Yes [provider]    Family History Family History  Problem Relation Age of Onset  . Heart attack Father        35s  . Hypothyroidism Father   . Diabetes type II Father   . Diabetes type II Mother   . Hypertension Mother   . Breast cancer Mother 28  . Hypothyroidism Brother   . Breast cancer Maternal Aunt   . Breast cancer Cousin   . Breast cancer Maternal Aunt   . Breast cancer Cousin     Social History Social History   Tobacco Use  . Smoking status: Never Smoker  . Smokeless tobacco: Never Used  Substance Use Topics  . Alcohol use: No  . Drug use: No     Allergies   Codeine; Ambien [zolpidem tartrate]; Crestor [rosuvastatin]; Lipitor [atorvastatin]; Morphine and related; Penicillins; Statins; Dilaudid [hydromorphone hcl]; Amitriptyline; Ceftin [cefuroxime axetil]; Lovaza [omega-3-acid ethyl esters]; Lunesta [eszopiclone]; Lyrica [pregabalin]; and Metformin and related   Review of Systems Review of Systems  Unable to perform ROS: Patient unresponsive     Physical Exam Updated Vital Signs BP 103/88   Pulse 86   Temp (!) 95.5 F (35.3 C)   Resp (!) 25   Ht 5\' 3"  (1.6 m)   Wt 92.1 kg (203 lb)   SpO2 (!) 86%   BMI 35.96 kg/m   Physical Exam  Constitutional: She appears well-developed and well-nourished. She appears distressed.  HENT:  Head: Normocephalic and atraumatic.  No obvious scalp trauma.  Dry mucous membranes.  Nasal airway in place.  Eyes:  Pupils are dilated and unresponsive.   Neck: Normal range of motion. Neck supple.  No meningismus.  Cardiovascular: Normal rate and regular rhythm. Exam reveals no gallop and no friction rub.  No murmur heard. Pulmonary/Chest:  Agonal breathing  Abdominal: Soft. Bowel sounds are normal. There is no tenderness. There is no rebound and no guarding.  Musculoskeletal: Normal range of motion. She exhibits no edema or tenderness.    Neurological:  Minimal spontaneous movement.  Not following commands.  Skin: Skin is warm and dry. Capillary refill takes less than 2 seconds. No rash noted. She is not diaphoretic. No erythema. There is pallor.  Nursing note and vitals reviewed.    ED Treatments / Results  Labs (all labs ordered are listed, but only abnormal results are displayed) Labs Reviewed  CBC WITH DIFFERENTIAL/PLATELET - Abnormal; Notable for the following components:      Result Value   WBC 29.3 (*)    RBC 3.70 (*)    Hemoglobin 6.8 (*)    HCT 27.2 (*)    MCV 73.5 (*)    MCH 18.4 (*)    MCHC 25.0 (*)    RDW 20.4 (*)    Platelets 708 (*)    Neutro Abs 24.6 (*)    Monocytes Absolute 2.1 (*)    All other components within normal limits  COMPREHENSIVE METABOLIC PANEL - Abnormal; Notable for the following components:   Sodium 133 (*)    Potassium 6.9 (*)    Chloride 100 (*)    CO2 4 (*)    Glucose, Bld 741 (*)    BUN 54 (*)    Creatinine, Ser 2.99 (*)    Calcium 8.3 (*)    Total Protein 6.0 (*)    Albumin 3.0 (*)    Total Bilirubin 1.9 (*)    GFR calc non Af Amer 15 (*)    GFR calc Af Amer 18 (*)    Anion gap 29 (*)    All other components within normal limits  BRAIN NATRIURETIC PEPTIDE - Abnormal; Notable for the following components:   B Natriuretic Peptide 1,406.0 (*)    All other components within normal limits  TROPONIN I - Abnormal; Notable for the following components:   Troponin I 0.32 (*)    All other components within normal limits  PROTIME-INR - Abnormal; Notable for the following components:   Prothrombin Time 18.6 (*)    All other components within normal limits  RAPID URINE DRUG SCREEN, HOSP PERFORMED - Abnormal; Notable for the following components:   Opiates POSITIVE (*)    Benzodiazepines POSITIVE (*)    All other components within normal limits  TSH - Abnormal; Notable for the following components:   TSH 0.265 (*)    All other components within normal limits  MAGNESIUM  - Abnormal; Notable for the following components:   Magnesium 3.1 (*)    All other components within normal limits  URINALYSIS, ROUTINE W REFLEX MICROSCOPIC - Abnormal; Notable for the following components:   Glucose, UA >=500 (*)    Hgb urine dipstick SMALL (*)    Ketones, ur 80 (*)    Bacteria, UA RARE (*)    Squamous Epithelial / LPF 0-5 (*)    All other components within normal limits  APTT  AMMONIA  ACETAMINOPHEN LEVEL  SALICYLATE LEVEL  ETHANOL  BLOOD GAS, ARTERIAL  I-STAT CG4 LACTIC ACID, ED  I-STAT CHEM 8, ED  I-STAT TROPONIN, ED  PREPARE RBC (CROSSMATCH)  TYPE AND SCREEN    EKG EKG Interpretation  Date/Time:  Saturday November 18 2017 16:27:09 EDT Ventricular Rate:  90 PR Interval:    QRS Duration: 166 QT Interval:  400 QTC Calculation: 490 R Axis:   57 Text Interpretation:  Sinus rhythm Prolonged PR interval Right bundle branch block Confirmed by Julianne Rice [phone]) on 11/18/2017 4:29:54  PM   Radiology Dg Chest Portable 1 View  Result Date: 11/18/2017 CLINICAL DATA:  67 year old female intubated for respiratory distress. Postoperative day 3 lumbar spine surgery. EXAM: PORTABLE CHEST 1 VIEW COMPARISON:  Portable chest 03/05/2017. FINDINGS: Portable AP supine view at 1544 hours. Endotracheal tube tip is in good position between the level of the clavicles and carina. Lung volumes are within normal limits. Mediastinal contours are stable and within normal limits no pneumothorax or pleural effusion. Allowing for portable technique the lungs are clear. Negative visible bowel gas pattern. Cervical ACDF hardware. No acute osseous abnormality identified. IMPRESSION: 1. Endotracheal tube tip in good position. 2.  No acute cardiopulmonary abnormality identified. Electronically Signed   By: Genevie Ann M.D.   On: 11/18/2017 15:55    Procedures Procedure Name: Intubation Date/Time: 11/18/2017 4:00 PM Performed by: Julianne Rice, MD Pre-anesthesia Checklist: Patient  identified, Emergency Drugs available, Suction available, Patient being monitored and Timeout performed Oxygen Delivery Method: Ambu bag Preoxygenation: Pre-oxygenation with 100% oxygen Induction Type: Rapid sequence Ventilation: Mask ventilation without difficulty Laryngoscope Size: Glidescope Tube size: 7.5 mm Number of attempts: 2 Placement Confirmation: ETT inserted through vocal cords under direct vision,  CO2 detector and Breath sounds checked- equal and bilateral Secured at: 23 cm Tube secured with: ETT holder Dental Injury: Teeth and Oropharynx as per pre-operative assessment  Difficulty Due To: Difficulty was anticipated      (including critical care time)  Medications Ordered in ED Medications  insulin aspart (novoLOG) 100 UNIT/ML injection (has no administration in time range)  0.9 %  sodium chloride infusion (has no administration in time range)  insulin regular (NOVOLIN R,HUMULIN R) 100 Units in sodium chloride 0.9 % 100 mL (1 Units/mL) infusion (has no administration in time range)  dextrose 50 % solution 25 mL (has no administration in time range)  0.9 %  sodium chloride infusion (125 mL/hr Intravenous New Bag/Given 11/18/17 1621)  dextrose 5 %-0.45 % sodium chloride infusion ( Intravenous Hold 11/18/17 1654)  calcium gluconate 1 g in sodium chloride 0.9 % 100 mL IVPB (has no administration in time range)  sodium polystyrene (KAYEXALATE) 15 GM/60ML suspension 30 g (has no administration in time range)  naloxone (NARCAN) injection ( Intravenous Canceled Entry 11/18/17 1530)  etomidate (AMIDATE) injection (20 mg Intravenous Given 11/18/17 1527)  succinylcholine (ANECTINE) injection (100 mg Intravenous Given 11/18/17 1527)  0.9 %  sodium chloride infusion ( Intravenous Stopped 11/18/17 1617)  insulin aspart (novoLOG) injection 10 Units (10 Units Intravenous Given 11/18/17 1545)  sodium chloride 0.9 % bolus 1,000 mL (0 mLs Intravenous Stopped 11/18/17 1619)   CRITICAL  CARE Performed by: Julianne Rice Total critical care time: 75 minutes Critical care time was exclusive of separately billable procedures and treating other patients. Critical care was necessary to treat or prevent imminent or life-threatening deterioration. Critical care was time spent personally by me on the following activities: development of treatment plan with patient and/or surrogate as well as nursing, discussions with consultants, evaluation of patient's response to treatment, examination of patient, obtaining history from patient or surrogate, ordering and performing treatments and interventions, ordering and review of laboratory studies, ordering and review of radiographic studies, pulse oximetry and re-evaluation of patient's condition.  Initial Impression / Assessment and Plan / ED Course  I have reviewed the triage vital signs and the nursing notes.  Pertinent labs & imaging results that were available during my care of the patient were reviewed by me and considered in my medical decision making (  see chart for details).     Given 2 mg of Narcan with no change in mental status.  Continues to have agonal respirations.  Intubated for airway protection and hypoxia.  Patient is chronically dehydrated with dry mucous memories.  Borderline hypertension.  Initiated fluid resuscitation.  Patient also has mild worsening of her chronic anemia.  Will transfuse 2 units of packed red blood cells.  Patient also has evidence of severe diabetic ketoacidosis.  Initiated insulin infusion protocol.  She does have mild elevation in her troponin which is likely related to her acute renal failure most likely due to dehydration.  Patient with adequate urine production.  Low suspicion for infectious process/sepsis.  Discussed with Dr. Luvenia Heller.  Suggest starting patient on Kayexalate.  Will except in transfer to [facility] ICU.  Patient's blood pressure with slight trend downward despite IV fluids.   Critical care MD is aware.  Will start levo fed prior to transport.  [facility] is at bedside. Final Clinical Impressions(s) / ED Diagnoses   Final diagnoses:  Diabetic ketoacidosis with coma associated with type 1 diabetes mellitus ([place])  Acute respiratory failure with hypoxia (HCC)  Acute renal failure, unspecified acute renal failure type ([place])  Anemia, unspecified type    ED Discharge Orders    None       Julianne Rice, MD 11/18/17 1836

## 2017-11-18 NOTE — Procedures (Signed)
Central Venous Catheter Insertion Procedure Note GWENDLOYN FORSEE [id number] 03-11-1951  Procedure: Insertion of Central Venous Catheter Indications: Assessment of intravascular volume, Drug and/or fluid administration and Frequent blood sampling  Procedure Details Consent: Risks of procedure as well as the alternatives and risks of each were explained to the (patient/caregiver).  Consent for procedure obtained. Time Out: Verified patient identification, verified procedure, site/side was marked, verified correct patient position, special equipment/implants available, medications/allergies/relevent history reviewed, required imaging and test results available.  Performed  Maximum sterile technique was used including antiseptics, cap, gloves, gown, hand hygiene, mask and sheet. Skin prep: Chlorhexidine; local anesthetic administered A antimicrobial bonded/coated triple lumen catheter was placed in the left internal jugular vein using the Seldinger technique. Ultrasound guidance used.Yes.   Catheter placed to 20 cm. Blood aspirated via all 3 ports and then flushed x 3. Line sutured x 2 and dressing applied.  Evaluation Blood flow good Complications: No apparent complications Patient did tolerate procedure well. Chest X-ray ordered to verify placement.  CXR: pending.  Georgann Housekeeper, AGACNP-BC [facility] Pulmonology/Critical Care Pager [phone] or [phone]  11/18/2017 9:38 PM

## 2017-11-19 LAB — BASIC METABOLIC PANEL
ANION GAP: 12 (ref 5–15)
ANION GAP: 11 (ref 5–15)
ANION GAP: 11 (ref 5–15)
ANION GAP: 14 (ref 5–15)
ANION GAP: 10 (ref 5–15)
BUN: 52 mg/dL — ABNORMAL HIGH (ref 6–20)
BUN: 49 mg/dL — ABNORMAL HIGH (ref 6–20)
BUN: 43 mg/dL — ABNORMAL HIGH (ref 6–20)
BUN: 38 mg/dL — ABNORMAL HIGH (ref 6–20)
BUN: 34 mg/dL — ABNORMAL HIGH (ref 6–20)
CALCIUM: 8 mg/dL — AB (ref 8.9–10.3)
CALCIUM: 8.2 mg/dL — AB (ref 8.9–10.3)
CALCIUM: 7.7 mg/dL — AB (ref 8.9–10.3)
CALCIUM: 8 mg/dL — AB (ref 8.9–10.3)
CHLORIDE: 112 mmol/L — AB (ref 101–111)
CHLORIDE: 114 mmol/L — AB (ref 101–111)
CO2: 19 mmol/L — AB (ref 22–32)
CO2: 22 mmol/L (ref 22–32)
CO2: 20 mmol/L — AB (ref 22–32)
CO2: 17 mmol/L — AB (ref 22–32)
CO2: 20 mmol/L — AB (ref 22–32)
Calcium: 7.9 mg/dL — ABNORMAL LOW (ref 8.9–10.3)
Chloride: 115 mmol/L — ABNORMAL HIGH (ref 101–111)
Chloride: 115 mmol/L — ABNORMAL HIGH (ref 101–111)
Chloride: 119 mmol/L — ABNORMAL HIGH (ref 101–111)
Creatinine, Ser: 2.75 mg/dL — ABNORMAL HIGH (ref 0.44–1.00)
Creatinine, Ser: 2.44 mg/dL — ABNORMAL HIGH (ref 0.44–1.00)
Creatinine, Ser: 2 mg/dL — ABNORMAL HIGH (ref 0.44–1.00)
Creatinine, Ser: 1.86 mg/dL — ABNORMAL HIGH (ref 0.44–1.00)
Creatinine, Ser: 1.79 mg/dL — ABNORMAL HIGH (ref 0.44–1.00)
GFR calc Af Amer: 19 mL/min — ABNORMAL LOW (ref >60)
GFR calc Af Amer: 22 mL/min — ABNORMAL LOW (ref >60)
GFR calc non Af Amer: 17 mL/min — ABNORMAL LOW (ref >60)
GFR calc non Af Amer: 19 mL/min — ABNORMAL LOW (ref >60)
GFR calc non Af Amer: 25 mL/min — ABNORMAL LOW (ref >60)
GFR calc non Af Amer: 27 mL/min — ABNORMAL LOW (ref >60)
GFR, EST AFRICAN AMERICAN: 29 mL/min — AB (ref >60)
GFR, EST AFRICAN AMERICAN: 31 mL/min — AB (ref >60)
GFR, EST AFRICAN AMERICAN: 33 mL/min — AB (ref >60)
GFR, EST NON AFRICAN AMERICAN: 28 mL/min — AB (ref >60)
GLUCOSE: 155 mg/dL — AB (ref 65–99)
GLUCOSE: 288 mg/dL — AB (ref 65–99)
GLUCOSE: 308 mg/dL — AB (ref 65–99)
Glucose, Bld: 268 mg/dL — ABNORMAL HIGH (ref 65–99)
Glucose, Bld: 204 mg/dL — ABNORMAL HIGH (ref 65–99)
POTASSIUM: 4 mmol/L (ref 3.5–5.1)
POTASSIUM: 4 mmol/L (ref 3.5–5.1)
POTASSIUM: 4.5 mmol/L (ref 3.5–5.1)
Potassium: 3.1 mmol/L — ABNORMAL LOW (ref 3.5–5.1)
Potassium: 3.8 mmol/L (ref 3.5–5.1)
Sodium: 143 mmol/L (ref 135–145)
Sodium: 147 mmol/L — ABNORMAL HIGH (ref 135–145)
Sodium: 146 mmol/L — ABNORMAL HIGH (ref 135–145)
Sodium: 146 mmol/L — ABNORMAL HIGH (ref 135–145)
Sodium: 149 mmol/L — ABNORMAL HIGH (ref 135–145)

## 2017-11-19 LAB — CBC
HCT: 27.7 % — ABNORMAL LOW (ref 36.0–46.0)
HCT: 27.5 % — ABNORMAL LOW (ref 36.0–46.0)
HEMOGLOBIN: 8.2 g/dL — AB (ref 12.0–15.0)
Hemoglobin: 8.3 g/dL — ABNORMAL LOW (ref 12.0–15.0)
MCH: 20 pg — AB (ref 26.0–34.0)
MCH: 20.4 pg — AB (ref 26.0–34.0)
MCHC: 29.6 g/dL — AB (ref 30.0–36.0)
MCHC: 30.2 g/dL (ref 30.0–36.0)
MCV: 67.7 fL — ABNORMAL LOW (ref 78.0–100.0)
MCV: 67.6 fL — AB (ref 78.0–100.0)
PLATELETS: 221 10*3/uL (ref 150–400)
PLATELETS: 213 10*3/uL (ref 150–400)
RBC: 4.09 MIL/uL (ref 3.87–5.11)
RBC: 4.07 MIL/uL (ref 3.87–5.11)
RDW: 20.7 % — AB (ref 11.5–15.5)
RDW: 20.6 % — AB (ref 11.5–15.5)
WBC: 12.5 10*3/uL — ABNORMAL HIGH (ref 4.0–10.5)
WBC: 12.1 10*3/uL — ABNORMAL HIGH (ref 4.0–10.5)

## 2017-11-19 LAB — GLUCOSE, CAPILLARY
GLUCOSE-CAPILLARY: 160 mg/dL — AB (ref 65–99)
GLUCOSE-CAPILLARY: 188 mg/dL — AB (ref 65–99)
GLUCOSE-CAPILLARY: 222 mg/dL — AB (ref 65–99)
GLUCOSE-CAPILLARY: 329 mg/dL — AB (ref 65–99)
GLUCOSE-CAPILLARY: 442 mg/dL — AB (ref 65–99)
Glucose-Capillary: 300 mg/dL — ABNORMAL HIGH (ref 65–99)
Glucose-Capillary: 267 mg/dL — ABNORMAL HIGH (ref 65–99)
Glucose-Capillary: 179 mg/dL — ABNORMAL HIGH (ref 65–99)
Glucose-Capillary: 170 mg/dL — ABNORMAL HIGH (ref 65–99)
Glucose-Capillary: 130 mg/dL — ABNORMAL HIGH (ref 65–99)
Glucose-Capillary: 228 mg/dL — ABNORMAL HIGH (ref 65–99)
Glucose-Capillary: 246 mg/dL — ABNORMAL HIGH (ref 65–99)
Glucose-Capillary: 280 mg/dL — ABNORMAL HIGH (ref 65–99)

## 2017-11-19 LAB — BLOOD GAS, ARTERIAL
ACID-BASE DEFICIT: 3.3 mmol/L — AB (ref 0.0–2.0)
Bicarbonate: 21 mmol/L (ref 20.0–28.0)
Drawn by: 517021
FIO2: 0.6
LHR: 12 {breaths}/min
O2 SAT: 99.2 %
PATIENT TEMPERATURE: 100.8
PCO2 ART: 38.3 mmHg (ref 32.0–48.0)
PEEP/CPAP: 5 cmH2O
PH ART: 7.364 (ref 7.350–7.450)
PO2 ART: 200 mmHg — AB (ref 83.0–108.0)
VT: 540 mL

## 2017-11-19 LAB — AMMONIA: Ammonia: 18 umol/L (ref 9–35)

## 2017-11-19 LAB — PHOSPHORUS: Phosphorus: 2.6 mg/dL (ref 2.5–4.6)

## 2017-11-19 LAB — FERRITIN: FERRITIN: 58 ng/mL (ref 11–307)

## 2017-11-19 LAB — TROPONIN I
TROPONIN I: 2.41 ng/mL — AB (ref <0.03)
Troponin I: 3.1 ng/mL (ref <0.03)
Troponin I: 3.2 ng/mL (ref <0.03)

## 2017-11-19 LAB — MRSA PCR SCREENING: MRSA by PCR: NEGATIVE

## 2017-11-19 LAB — LACTIC ACID, PLASMA: LACTIC ACID, VENOUS: 1.5 mmol/L (ref 0.5–1.9)

## 2017-11-19 MED ORDER — HEPARIN SODIUM (PORCINE) 5000 UNIT/ML IJ SOLN: 5000.0000 [IU] | Freq: Three times a day (TID) | INTRAMUSCULAR | Status: DC

## 2017-11-19 MED ORDER — SODIUM CHLORIDE 0.9 % IV SOLN
INTRAVENOUS | Status: DC
  Administered 2017-11-19: 04:00:00 via INTRAVENOUS

## 2017-11-19 MED ORDER — INSULIN GLARGINE 100 UNIT/ML ~~LOC~~ SOLN
33.0000 [IU] | Freq: Two times a day (BID) | SUBCUTANEOUS | Status: DC
  Administered 2017-11-19 (×2): 33 [IU] via SUBCUTANEOUS
  Filled 2017-11-19 (×3): qty 0.33

## 2017-11-19 MED ORDER — SODIUM CHLORIDE 0.9 % IV SOLN: INTRAVENOUS | Status: AC

## 2017-11-19 MED ORDER — DEXTROSE-NACL 5-0.45 % IV SOLN
INTRAVENOUS | Status: DC
  Administered 2017-11-19 – 2017-11-20 (×4): via INTRAVENOUS

## 2017-11-19 MED ORDER — LACTULOSE 10 GM/15ML PO SOLN
20.0000 g | Freq: Every day | ORAL | Status: DC
  Administered 2017-11-19: 20 g
  Filled 2017-11-19: qty 30

## 2017-11-19 MED ORDER — FENTANYL CITRATE (PF) 100 MCG/2ML IJ SOLN
50.0000 ug | INTRAMUSCULAR | Status: DC | PRN
  Administered 2017-11-19 (×3): 50 ug via INTRAVENOUS
  Filled 2017-11-19 (×4): qty 2

## 2017-11-19 MED ORDER — PANTOPRAZOLE SODIUM 40 MG PO PACK
40.0000 mg | PACK | Freq: Every day | ORAL | Status: DC
  Administered 2017-11-19: 40 mg
  Filled 2017-11-19 (×3): qty 20

## 2017-11-19 MED ORDER — LABETALOL HCL 5 MG/ML IV SOLN: 10.0000 mg | INTRAVENOUS | Status: DC | PRN

## 2017-11-19 MED ORDER — SODIUM CHLORIDE 0.9 % IV SOLN
INTRAVENOUS | Status: DC
  Administered 2017-11-19: 18.6 [IU]/h via INTRAVENOUS

## 2017-11-19 MED ORDER — AMLODIPINE BESYLATE 5 MG PO TABS
5.0000 mg | ORAL_TABLET | Freq: Every day | ORAL | Status: DC
  Administered 2017-11-19 – 2017-11-21 (×3): 5 mg
  Filled 2017-11-19 (×4): qty 1

## 2017-11-19 MED ORDER — AMLODIPINE 1 MG/ML ORAL SUSPENSION: 5.0000 mg | Freq: Every day | ORAL | Status: DC

## 2017-11-19 MED ORDER — POTASSIUM CHLORIDE 20 MEQ/15ML (10%) PO SOLN
40.0000 meq | Freq: Two times a day (BID) | ORAL | Status: AC
  Administered 2017-11-19 (×2): 40 meq via ORAL
  Filled 2017-11-19 (×2): qty 30

## 2017-11-19 MED ORDER — POTASSIUM CHLORIDE 10 MEQ/100ML IV SOLN
10.0000 meq | INTRAVENOUS | Status: AC
  Administered 2017-11-19 (×2): 10 meq via INTRAVENOUS
  Filled 2017-11-19 (×2): qty 100

## 2017-11-19 MED ORDER — DEXMEDETOMIDINE HCL IN NACL 400 MCG/100ML IV SOLN
0.4000 ug/kg/h | INTRAVENOUS | Status: DC
  Administered 2017-11-19: 0.4 ug/kg/h via INTRAVENOUS
  Administered 2017-11-19: 0.7 ug/kg/h via INTRAVENOUS
  Administered 2017-11-20 (×4): 1 ug/kg/h via INTRAVENOUS
  Filled 2017-11-19 (×5): qty 100

## 2017-11-19 MED ORDER — SODIUM CHLORIDE 0.9 % IV SOLN
2.0000 g | INTRAVENOUS | Status: DC
  Administered 2017-11-19 – 2017-11-20 (×2): 2 g via INTRAVENOUS
  Filled 2017-11-19 (×2): qty 2

## 2017-11-19 MED ORDER — INSULIN ASPART 100 UNIT/ML ~~LOC~~ SOLN
0.0000 [IU] | SUBCUTANEOUS | Status: DC
  Administered 2017-11-19: 7 [IU] via SUBCUTANEOUS
  Administered 2017-11-19: 11 [IU] via SUBCUTANEOUS
  Administered 2017-11-19: 3 [IU] via SUBCUTANEOUS
  Administered 2017-11-19: 7 [IU] via SUBCUTANEOUS
  Administered 2017-11-20: 11 [IU] via SUBCUTANEOUS
  Administered 2017-11-20: 4 [IU] via SUBCUTANEOUS
  Administered 2017-11-20 (×2): 7 [IU] via SUBCUTANEOUS
  Administered 2017-11-21: 4 [IU] via SUBCUTANEOUS
  Administered 2017-11-21 (×2): 3 [IU] via SUBCUTANEOUS
  Administered 2017-11-22 (×2): 4 [IU] via SUBCUTANEOUS
  Administered 2017-11-22: 7 [IU] via SUBCUTANEOUS
  Administered 2017-11-22 – 2017-11-23 (×3): 4 [IU] via SUBCUTANEOUS
  Administered 2017-11-23: 3 [IU] via SUBCUTANEOUS
  Administered 2017-11-23: 11 [IU] via SUBCUTANEOUS
  Administered 2017-11-23: 4 [IU] via SUBCUTANEOUS
  Administered 2017-11-23 – 2017-11-24 (×2): 7 [IU] via SUBCUTANEOUS
  Administered 2017-11-24: 3 [IU] via SUBCUTANEOUS
  Administered 2017-11-24 (×2): 11 [IU] via SUBCUTANEOUS
  Administered 2017-11-24 – 2017-11-25 (×2): 4 [IU] via SUBCUTANEOUS
  Administered 2017-11-25 (×2): 11 [IU] via SUBCUTANEOUS

## 2017-11-19 MED ORDER — INSULIN GLARGINE 100 UNIT/ML ~~LOC~~ SOLN: 33.0000 [IU] | Freq: Two times a day (BID) | SUBCUTANEOUS | Status: DC

## 2017-11-19 MED ORDER — ACETAMINOPHEN 325 MG PO TABS
650.0000 mg | ORAL_TABLET | Freq: Four times a day (QID) | ORAL | Status: DC | PRN
  Administered 2017-11-19 – 2017-11-24 (×2): 650 mg via ORAL
  Filled 2017-11-19 (×2): qty 2

## 2017-11-19 MED ORDER — FENTANYL CITRATE (PF) 100 MCG/2ML IJ SOLN
100.0000 ug | INTRAMUSCULAR | Status: DC | PRN
  Administered 2017-11-19: 50 ug via INTRAVENOUS

## 2017-11-19 MED ORDER — DEXMEDETOMIDINE HCL IN NACL 200 MCG/50ML IV SOLN
0.4000 ug/kg/h | INTRAVENOUS | Status: DC
Start: 2017-11-19 — End: 2017-11-19
  Filled 2017-11-19: qty 50

## 2017-11-19 NOTE — Progress Notes (Addendum)
Pharmacy Antibiotic Note  [name] is a 67 y.o. female admitted on 11/18/2017 with sepsis. Tx from [facility]. Admitted with DKA/altered mental status. WBC markedly elevated. Noted renal dysfunction.   Plan: Cefepime 2g IV q24h per MD (adjusted for renal function) Trend WBC, temp, renal function  F/U infectious work-up  Height: 5\' 3"  (160 cm) Weight: 212 lb 15.4 oz (96.6 kg) IBW/kg (Calculated) : 52.4  Temp (24hrs), Avg:99 F (37.2 C), Min:95.5 F (35.3 C), Max:101.1 F (38.4 C)  Recent Labs  Lab 11/16/17 0635 11/17/17 0617 11/18/17 1528 11/18/17 2032 11/18/17 2105 11/19/17 0046 11/19/17 0417  WBC 9.0 13.1* 29.3*  --  36.1*  --   --   CREATININE 1.13*  --  2.99*  --  3.62*  --  2.75*  LATICACIDVEN  --   --   --  1.5  --  1.5  --     Estimated Creatinine Clearance: 22 mL/min (A) (by C-G formula based on SCr of 2.75 mg/dL (H)).    Allergies  Allergen Reactions  . Codeine Nausea And Vomiting  . Ambien [Zolpidem Tartrate] Other (See Comments)    Up sleep walking and eating  . Crestor [Rosuvastatin] Other (See Comments)    Muscle weakness, cramps and aching all over body  . Lipitor [Atorvastatin] Itching and Other (See Comments)    Muscle weakness, cramps and aches all over body  . Morphine And Related Other (See Comments)    Flushing and feeling hot Tolerates with Diphenhydramine  . Penicillins Rash and Other (See Comments)    Caused Headaches also Has patient had a PCN reaction causing immediate rash, facial/tongue/throat swelling, SOB or lightheadedness with hypotension: No Has patient had a PCN reaction causing severe rash involving mucus membranes or skin necrosis: No Has patient had a PCN reaction that required hospitalization No Has patient had a PCN reaction occurring within the last 10 years: No If all of the above answers are "NO", then may proceed with Cephalosporin use.    . Statins Other (See Comments)    Muscle weakness, cramps and aching all over body  .  Dilaudid [Hydromorphone Hcl] Other (See Comments)    Hallucinations/ argumentative, goes beserk  . Amitriptyline Other (See Comments)    Loopy feeling  . Ceftin [Cefuroxime Axetil] Other (See Comments)    Stomach pain   . Lovaza [Omega-3-Acid Ethyl Esters] Other (See Comments)    Stomach pain   . Lunesta [Eszopiclone] Other (See Comments)    Causes dizziness  . Lyrica [Pregabalin] Nausea Only  . Metformin And Related Nausea Only    Pt denies this intolerance    Narda Bonds 11/19/2017 5:44 AM

## 2017-11-19 NOTE — Progress Notes (Signed)
RT note- Patient will be switched to precedex, placed back to full support due to RR greater than 40

## 2017-11-19 NOTE — Progress Notes (Signed)
PULMONARY  / CRITICAL CARE MEDICINE  Name: [name] MRN: [id number] DOB: 1950-11-04    LOS: 57  REFERRING MD :  EDP Lita Mains  CHIEF COMPLAINT:  DKA w/ respiratory arrest  BRIEF PATIENT DESCRIPTION: 67 y/o woman with Hx of IDDM, hypothyroidism, G1DD (LVEF 65-70% 09/08/17), who recently underwent lumbar spine surgery on 4/12 taken to [facility] emergently for respiratory arrest en route, found to be in DKA with AG 29, BS > 700, pH 6.8 pCO2 below detectable range.  LINES / TUBES: Endotracheal tube 4/13>>> LIJ CVC 4/13>>> Foley catheter 4/13>>> L femoral Aline 4/13>>>  CULTURES: 4/13 Blood Cx x2 NGTD  ANTIBIOTICS: Vancomycin 4/13>>> Cefepime 4/13>>>  SIGNIFICANT EVENTS:  4/13 Admitted intubated for respiratory arrest  HISTORY OF PRESENT ILLNESS:  Obtained from chart review, provider documentation, patient's son at bedside due to intubated status.  [name] developed increased work of breathing and distress at home around 1pm on 4/13 prompting EMS call. She was found to be hyperglycemic CBG > 600 and transported. In transit she suffered respiratory arrest. Narcan was administered without benefit and she was emergently intubated at [place]. She was transferred to [facility] ICU on mechanical ventilation. Levophed started for initial hypotension, treatment for DKA, and empiric antibiotics were administered within short time of arrival.  Recent events include lumbar spine surgery on 4/12 without reported operative complications. Her family reports some concern about missed ot extra medications due to polypharmacy (19 pills, insulin injections).  PAST MEDICAL HISTORY :  Past Medical History:  Diagnosis Date  . Anemia    iron deficiency  . Arthritis   . Blood transfusion   . Cervical spondylosis with myelopathy and radiculopathy   . Depression   . Diabetes mellitus    Type II  . Diastolic dysfunction   . Diverticulitis   . DJD (degenerative joint disease)   . DVT (deep venous  thrombosis) (HCC)    times 2 lower leg  . Endometriosis   . Family history of adverse reaction to anesthesia    mom and dad PONV  . Fibromyalgia   . GERD (gastroesophageal reflux disease)    occ  . Hiatal hernia   . History of kidney stones   . Hyperlipidemia   . Hypertension   . Hypothyroidism   . Insomnia   . Mild aortic insufficiency    by echo 04/2013  . Osteoarthritis    back and knee  . Ovarian cyst   . PONV (postoperative nausea and vomiting)   . RLS (restless legs syndrome)   . Thoracic compression fracture ([place])   . Uterine fibroid   . UTI (lower urinary tract infection)   . Villous adenoma of right colon 04/08/2016  . Vitamin D deficiency disease    Past Surgical History:  Procedure Laterality Date  . ANTERIOR CERVICAL DECOMP/DISCECTOMY FUSION N/A 03/23/2017   Procedure: ANTERIOR CERVICAL DECOMPRESSION/DISCECTOMY FUSION, INTERBODY PROSTHESIS,PLATE CERVICAL THREE- CERVICAL FOUR, CERVICAL FOUR- CERVICAL FIVE, CERVICAL FIVE- CERVICAL SIX;  Surgeon: Newman Pies, MD;  Location: [facility];  Service: Neurosurgery;  Laterality: N/A;  . APPENDECTOMY    . BACK SURGERY  ,2005   April  2013 - spinal fusion[facility]  . BREAST SURGERY     breast reduction  . CARDIAC CATHETERIZATION  04/2013   normal coronary arteries and normal LVF  . CARPAL TUNNEL RELEASE  06/05/2012   Procedure: CARPAL TUNNEL RELEASE;  Surgeon: Cammie Sickle., MD;  Location: [place];  Service: Orthopedics;  Laterality: Left;  .  CESAREAN SECTION     x 2  . CHOLECYSTECTOMY    . COLECTOMY  04/08/2016  . DILATION AND CURETTAGE OF UTERUS    . DORSAL COMPARTMENT RELEASE  06/05/2012   Procedure: RELEASE DORSAL COMPARTMENT (DEQUERVAIN);  Surgeon: Cammie Sickle., MD;  Location: [facility];  Service: Orthopedics;  Laterality: Left;  Excision of mixoid cyst also  . EYE SURGERY     cataracts bilateral  . JOINT REPLACEMENT     Bilateral knee  . KNEE ARTHROPLASTY  09   lft  partial  . KNEE ARTHROPLASTY     rt  . LAPAROSCOPIC PARTIAL COLECTOMY N/A 04/08/2016   Procedure: LAPAROSCOPIC ASSISTED ASCENDING COLECTOMY POSSIBLE OPEN COLECTOMY;  Surgeon: Fanny Skates, MD;  Location: [place];  Service: General;  Laterality: N/A;  . orthopedic surgeries     multiple  . REDUCTION MAMMAPLASTY Bilateral   . SHOULDER OPEN ROTATOR CUFF REPAIR     rt and lft  . TUBAL LIGATION     btsp   Prior to Admission medications   Medication Sig Start Date End Date Taking? Authorizing Provider  acetaminophen (TYLENOL) 500 MG tablet Take 1,000 mg by mouth every 8 (eight) hours as needed for mild pain or headache.   Yes [provider]  amLODipine (NORVASC) 5 MG tablet Take 5 mg by mouth daily.   Yes [provider]  aspirin 81 MG chewable tablet Chew 81 mg by mouth daily.   Yes [provider]  clobetasol (TEMOVATE) 0.05 % external solution Apply 1 application topically See admin instructions. Apply twice daily to scalp and ear as needed for itching/rash (do not apply to face/groin/underarms) 07/13/16  Yes [provider]  Cyanocobalamin (B-12) 2500 MCG TABS Take 2,500 mcg by mouth daily.   Yes [provider]  cyclobenzaprine (FLEXERIL) 10 MG tablet Take 1 tablet (10 mg total) by mouth 3 (three) times daily as needed for muscle spasms. 03/24/17  Yes Costella, Vista Mink, PA-C  docusate sodium (COLACE) 100 MG capsule Take 1 capsule (100 mg total) by mouth 2 (two) times daily. 11/17/17  Yes Newman Pies, MD  doxycycline (VIBRA-TABS) 100 MG tablet Take 100 mg by mouth 2 (two) times daily as needed. For scalp/skin breakdowns. 10/10/17  Yes [provider]  DULoxetine (CYMBALTA) 60 MG capsule Take 120 mg by mouth daily.    Yes [provider]  ferrous sulfate 325 (65 FE) MG tablet Take 1 tablet (325 mg total) by mouth 2 (two) times daily with a meal. 11/17/17  Yes Newman Pies, MD  fluticasone [facility]) 50 MCG/ACT nasal spray Place 2  sprays into both nostrils daily as needed for allergies or rhinitis.   Yes [provider]  furosemide (LASIX) 20 MG tablet Take 20 mg by mouth daily as needed for fluid or edema.    Yes [provider]  JARDIANCE 10 MG TABS tablet Take 10 mg by mouth daily. 07/18/17  Yes [provider]  levothyroxine (SYNTHROID, LEVOTHROID) 150 MCG tablet Take 150 mcg by mouth daily before breakfast. 10/10/17  Yes [provider]  NOVOLIN 70/30 RELION (70-30) 100 UNIT/ML injection Inject 50 Units into the skin 2 (two) times daily. 07/12/17  Yes [provider]  nystatin (MYCOSTATIN) 100000 UNIT/ML suspension Use as directed 5 mLs in the mouth or throat 4 (four) times daily as needed (for tongue burning/mouth sores.).  08/22/17  Yes [provider]  olmesartan (BENICAR) 40 MG tablet Take 40 mg by mouth daily.  08/22/17  Yes [provider]  oxyCODONE (OXY IR/ROXICODONE) 5 MG immediate release tablet Take 1 tablet (5 mg total) by mouth every 4 (four) hours as needed for moderate pain ((score 4 to 6)). 11/17/17  Yes Newman Pies, MD  pantoprazole (PROTONIX) 40 MG tablet Take 40 mg by mouth 2 (two) times daily.   Yes [provider]  Probiotic Product (PROBIOTIC PO) Take 1 capsule by mouth daily.   Yes [provider]  promethazine (PHENERGAN) 25 MG tablet Take 25 mg by mouth every 8 (eight) hours as needed for nausea or vomiting.   Yes [provider]  temazepam (RESTORIL) 15 MG capsule Take 30 mg by mouth at bedtime. 10/10/17  Yes [provider]   Allergies  Allergen Reactions  . Codeine Nausea And Vomiting  . Ambien [Zolpidem Tartrate] Other (See Comments)    Up sleep walking and eating  . Crestor [Rosuvastatin] Other (See Comments)    Muscle weakness, cramps and aching all over body  . Lipitor [Atorvastatin] Itching and Other (See Comments)    Muscle weakness, cramps and aches all over body  . Morphine And Related  Other (See Comments)    Flushing and feeling hot Tolerates with Diphenhydramine  . Penicillins Rash and Other (See Comments)    Caused Headaches also Has patient had a PCN reaction causing immediate rash, facial/tongue/throat swelling, SOB or lightheadedness with hypotension: No Has patient had a PCN reaction causing severe rash involving mucus membranes or skin necrosis: No Has patient had a PCN reaction that required hospitalization No Has patient had a PCN reaction occurring within the last 10 years: No If all of the above answers are "NO", then may proceed with Cephalosporin use.    . Statins Other (See Comments)    Muscle weakness, cramps and aching all over body  . Dilaudid [Hydromorphone Hcl] Other (See Comments)    Hallucinations/ argumentative, goes beserk  . Amitriptyline Other (See Comments)    Loopy feeling  . Ceftin [Cefuroxime Axetil] Other (See Comments)    Stomach pain   . Lovaza [Omega-3-Acid Ethyl Esters] Other (See Comments)    Stomach pain   . Lunesta [Eszopiclone] Other (See Comments)    Causes dizziness  . Lyrica [Pregabalin] Nausea Only  . Metformin And Related Nausea Only    Pt denies this intolerance    FAMILY HISTORY:  Family History  Problem Relation Age of Onset  . Heart attack Father        22s  . Hypothyroidism Father   . Diabetes type II Father   . Diabetes type II Mother   . Hypertension Mother   . Breast cancer Mother 62  . Hypothyroidism Brother   . Breast cancer Maternal Aunt   . Breast cancer Cousin   . Breast cancer Maternal Aunt   . Breast cancer Cousin    SOCIAL HISTORY:  reports that she has never smoked. She has never used smokeless tobacco. She reports that she does not drink alcohol or use drugs.  REVIEW OF SYSTEMS:  Unable to be completed due to patient's clinical status (intubated)  INTERVAL HISTORY:  Overnight metabolic acidosis greatly improved with IVF and insulin gtt. Her blood pressure improved with levo d/ced and  subsequent hypertension. Mental status increased from GCS 3 to arousable to voice.  VITAL SIGNS: Temp:  [95.5 F (35.3 C)-101.3 F (38.5 C)] 101.3 F (38.5 C) (04/14 0710) Pulse Rate:  [86-110] 106 (04/14 0734) Resp:  [13-37] 24 (04/14 0710) BP: (65-139)/(25-88)  133/57 (04/14 0700) SpO2:  [86 %-100 %] 100 % (04/14 0734) Arterial Line BP: (119-214)/(48-71) 174/61 (04/14 0710) FiO2 (%):  [40 %-100 %] 40 % (04/14 0734) Weight:  [203 lb (92.1 kg)-212 lb 15.4 oz (96.6 kg)] 212 lb 15.4 oz (96.6 kg) (04/14 0500) HEMODYNAMICS:   VENTILATOR SETTINGS: Vent Mode: PRVC FiO2 (%):  [40 %-100 %] 40 % Set Rate:  [12 bmp-20 bmp] 12 bmp Vt Set:  [540 mL] 540 mL PEEP:  [5 cmH20] 5 cmH20 Plateau Pressure:  [18 cmH20-19 cmH20] 18 cmH20 INTAKE / OUTPUT: Intake/Output      04/13 0701 - 04/14 0700 04/14 0701 - 04/15 0700   I.V. (mL/kg) 4177.5 (43.2)    Blood 630    IV Piggyback 300    Total Intake(mL/kg) 5107.5 (52.9)    Urine (mL/kg/hr) 2730    Total Output 2730    Net +2377.5           PHYSICAL EXAMINATION: General:  Intubated Neuro:  Arousable but not answering specific questions, RASS -1 to -2 off sedation HEENT:  PERRL, no JVD Cardiovascular:  RRR, no murmurs Lungs:  Good air movement b/l, vent assisted breaths, some coughing Abdomen:  Grimaces to soft pressure, not hard or tympanic Musculoskeletal:  No peripheral edema, 2+ peripheral pulses Skin:  No rashes   LABS: Cbc Recent Labs  Lab 11/18/17 2105 11/19/17 0526 11/19/17 0632  WBC 36.1* 12.5* 12.1*  HGB 6.8* 8.2* 8.3*  HCT 26.6* 27.7* 27.5*  PLT 572* 221 213    Chemistry  Recent Labs  Lab 11/18/17 1528 11/18/17 2105 11/19/17 0417 11/19/17 0632  NA 133* 136 143 147*  K 6.9* 5.3* 3.1* 3.8  CL 100* 106 112* 114*  CO2 4* <7* 19* 22  BUN 54* 57* 52* 49*  CREATININE 2.99* 3.62* 2.75* 2.44*  CALCIUM 8.3* 7.9* 8.0* 8.2*  MG 3.1* 2.6*  --   --   PHOS  --  7.3*  --   --   GLUCOSE 741* 605* 268* 204*    Liver  fxn Recent Labs  Lab 11/18/17 1528 11/18/17 2105  AST 24 47*  ALT 18 28  ALKPHOS 104 109  BILITOT 1.9* 1.8*  PROT 6.0* 5.6*  ALBUMIN 3.0* 2.9*   coags Recent Labs  Lab 11/18/17 1528  APTT 31  INR 1.57   Sepsis markers Recent Labs  Lab 11/18/17 2032 11/18/17 2105 11/19/17 0046  LATICACIDVEN 1.5  --  1.5  PROCALCITON  --  4.08  --    Cardiac markers Recent Labs  Lab 11/18/17 1528 11/18/17 2105 11/19/17 0417  TROPONINI 0.32* 1.18* 3.10*   BNP No results for input(s): PROBNP in the last 168 hours. ABG Recent Labs  Lab 11/18/17 1612 11/18/17 2251 11/19/17 0435  PHART 6.811* 7.041* 7.364  PCO2ART CRITICAL RESULT CALLED TO, READ BACK BY AND VERIFIED WITH: 23.3* 38.3  PO2ART 116.0* 347.0* 200*  HCO3  --  6.3* 21.0  TCO2  --  7*  --     CBG trend Recent Labs  Lab 11/19/17 0200 11/19/17 0301 11/19/17 0358 11/19/17 0504 11/19/17 0603  GLUCAP 329* 300* 267* 222* 188*    IMAGING: 4/13 CXR: ETT in place, low lung volumes, perihilar atelectasis, no consolidation or obvious edema 4/13 Abd: OG tube terminating in gastric antrum 4/13 CT Head: no acute abnormality  ECG:  DIAGNOSES: Active Problems:   DKA (diabetic ketoacidoses) (HCC)   Diabetic ketoacidosis ([place])   Acute respiratory failure with hypoxia (HCC)   Acute renal  failure ([place])   Endotracheal tube present   Septic shock ([place])   ASSESSMENT / PLAN:  PULMONARY ASSESSMENT: Acute respiratory failure Seems 2/2 severe metabolic acidosis, obtundation RR very rapid at initial SBT On minimal PRVC settings PLAN:   8cc/kg TV Continue A-line today SBT Maintaining off bicarb  CARDIOVASCULAR ASSESSMENT: BP improved, off levo Troponin 3.1 c/w NSTEMI Negative lactate PLAN:  Repeat EKG Trend troponin to peak Hold ARB for AKI  RENAL ASSESSMENT:  AGMA 2/2 DKA, now improved CBG >600 on arrival now transitioned to D5 IVF AKI improving: BUN 49 SCr 2.44 AG 12 CO2 22 Off bicarb  PLAN:    Decrease Bmet frequency q6hr today Continue IVF per DKA phase 2 Follow UOP foley catheter  GASTROINTESTINAL ASSESSMENT:  ?Abdominal pain on exam 2/2 DKA OGT draining gastric contents Ammonia corrected o/n PLAN:   SUP Continue lactulose per tube, d/c ammonia monitoring  HEMATOLOGIC ASSESSMENT: Acute on chronic anemia s/p 2 units pRBCs - appropriate correction Mild leukocytosis, mild left shift stress rxn vs infection? PLAN:  Checking ferritin Reactive thrombocytosis, normal VTE ppx  INFECTIOUS ASSESSMENT: Leukocytosis, borderline temp No obvious source or reported sxs Fever up to 101.3 after arrival PLAN:   F/U Blood Cx Continue abtx with rising fever curve  ENDOCRINE ASSESSMENT: DKA now closed gap, normal glucose NPO 2/2 intubated status Hx of questionable DKA, certainly true here PLAN:   Phase 2 DKA protocol Continue dextrose until diet or TF q4hrs CBGs A0TMA metabolic panel  NEUROLOGIC ASSESSMENT:  Waking up more this morning Restless legs? Also checking iron studies PLAN:   RASS 0 to -1 May start PRN fentanyl for PAD   Pulmonary and [place] Pager: [phone]  11/19/2017, 8:16 AM

## 2017-11-19 NOTE — Progress Notes (Signed)
RT note- Placed back to full support not following commands and elevated RR in the 40's. RN and MD aware.

## 2017-11-20 ENCOUNTER — Inpatient Hospital Stay (HOSPITAL_COMMUNITY): Payer: Medicare Other

## 2017-11-20 LAB — GLUCOSE, CAPILLARY
GLUCOSE-CAPILLARY: 72 mg/dL (ref 65–99)
Glucose-Capillary: 109 mg/dL — ABNORMAL HIGH (ref 65–99)
Glucose-Capillary: 120 mg/dL — ABNORMAL HIGH (ref 65–99)
Glucose-Capillary: 168 mg/dL — ABNORMAL HIGH (ref 65–99)
Glucose-Capillary: 215 mg/dL — ABNORMAL HIGH (ref 65–99)
Glucose-Capillary: 248 mg/dL — ABNORMAL HIGH (ref 65–99)
Glucose-Capillary: 260 mg/dL — ABNORMAL HIGH (ref 65–99)
Glucose-Capillary: 38 mg/dL — CL (ref 65–99)

## 2017-11-20 LAB — CBC
HCT: 26 % — ABNORMAL LOW (ref 36.0–46.0)
HEMOGLOBIN: 7.8 g/dL — AB (ref 12.0–15.0)
MCH: 20.2 pg — AB (ref 26.0–34.0)
MCHC: 30 g/dL (ref 30.0–36.0)
MCV: 67.4 fL — AB (ref 78.0–100.0)
Platelets: 146 10*3/uL — ABNORMAL LOW (ref 150–400)
RBC: 3.86 MIL/uL — AB (ref 3.87–5.11)
RDW: 21.5 % — ABNORMAL HIGH (ref 11.5–15.5)
WBC: 6.3 10*3/uL (ref 4.0–10.5)

## 2017-11-20 LAB — BASIC METABOLIC PANEL
ANION GAP: 7 (ref 5–15)
BUN: 32 mg/dL — ABNORMAL HIGH (ref 6–20)
CHLORIDE: 120 mmol/L — AB (ref 101–111)
CO2: 24 mmol/L (ref 22–32)
Calcium: 8 mg/dL — ABNORMAL LOW (ref 8.9–10.3)
Creatinine, Ser: 1.56 mg/dL — ABNORMAL HIGH (ref 0.44–1.00)
GFR calc non Af Amer: 33 mL/min — ABNORMAL LOW (ref >60)
GFR, EST AFRICAN AMERICAN: 39 mL/min — AB (ref >60)
Glucose, Bld: 267 mg/dL — ABNORMAL HIGH (ref 65–99)
Potassium: 4.1 mmol/L (ref 3.5–5.1)
Sodium: 151 mmol/L — ABNORMAL HIGH (ref 135–145)

## 2017-11-20 LAB — BLOOD CULTURE ID PANEL (REFLEXED)
ACINETOBACTER BAUMANNII: NOT DETECTED
CANDIDA GLABRATA: NOT DETECTED
Candida albicans: NOT DETECTED
Candida krusei: NOT DETECTED
Candida parapsilosis: NOT DETECTED
Candida tropicalis: NOT DETECTED
ENTEROBACTER CLOACAE COMPLEX: NOT DETECTED
ENTEROCOCCUS SPECIES: NOT DETECTED
Enterobacteriaceae species: NOT DETECTED
Escherichia coli: NOT DETECTED
Haemophilus influenzae: NOT DETECTED
Klebsiella oxytoca: NOT DETECTED
Klebsiella pneumoniae: NOT DETECTED
LISTERIA MONOCYTOGENES: NOT DETECTED
Methicillin resistance: NOT DETECTED
NEISSERIA MENINGITIDIS: NOT DETECTED
PROTEUS SPECIES: NOT DETECTED
Pseudomonas aeruginosa: NOT DETECTED
SERRATIA MARCESCENS: NOT DETECTED
STAPHYLOCOCCUS AUREUS BCID: NOT DETECTED
STAPHYLOCOCCUS SPECIES: DETECTED — AB
STREPTOCOCCUS AGALACTIAE: NOT DETECTED
STREPTOCOCCUS SPECIES: NOT DETECTED
Streptococcus pneumoniae: NOT DETECTED
Streptococcus pyogenes: NOT DETECTED

## 2017-11-20 LAB — BPAM RBC
BLOOD PRODUCT EXPIRATION DATE: 201905082359
BLOOD PRODUCT EXPIRATION DATE: 201905082359
ISSUE DATE / TIME: 201904132344
ISSUE DATE / TIME: 201904140220
UNIT TYPE AND RH: 5100
Unit Type and Rh: 5100

## 2017-11-20 LAB — TYPE AND SCREEN
ABO/RH(D): O POS
Antibody Screen: NEGATIVE
UNIT DIVISION: 0
Unit division: 0

## 2017-11-20 LAB — AMMONIA: Ammonia: 18 umol/L (ref 9–35)

## 2017-11-20 MED ORDER — HALOPERIDOL LACTATE 5 MG/ML IJ SOLN
INTRAMUSCULAR | Status: AC
  Administered 2017-11-20: 5 mg via INTRAVENOUS
  Filled 2017-11-20: qty 1

## 2017-11-20 MED ORDER — INSULIN GLARGINE 100 UNIT/ML ~~LOC~~ SOLN
40.0000 [IU] | Freq: Every day | SUBCUTANEOUS | Status: DC
  Administered 2017-11-21: 40 [IU] via SUBCUTANEOUS
  Filled 2017-11-20 (×2): qty 0.4

## 2017-11-20 MED ORDER — INSULIN GLARGINE 100 UNIT/ML ~~LOC~~ SOLN
50.0000 [IU] | Freq: Two times a day (BID) | SUBCUTANEOUS | Status: DC
  Administered 2017-11-20: 50 [IU] via SUBCUTANEOUS
  Filled 2017-11-20 (×2): qty 0.5

## 2017-11-20 MED ORDER — HALOPERIDOL LACTATE 5 MG/ML IJ SOLN
5.0000 mg | Freq: Four times a day (QID) | INTRAMUSCULAR | Status: DC | PRN
  Administered 2017-11-20 – 2017-11-21 (×3): 5 mg via INTRAVENOUS
  Filled 2017-11-20 (×2): qty 1

## 2017-11-20 MED ORDER — DEXTROSE 5 % IV SOLN
INTRAVENOUS | Status: DC
  Administered 2017-11-20: via INTRAVENOUS

## 2017-11-20 NOTE — Procedures (Signed)
Extubation Procedure Note  Patient Details:   Name: [name] DOB: 02/18/1951 MRN: [id number]   Airway Documentation:  Airway 7 mm (Active)  Secured at (cm) 24 cm 11/20/2017  9:54 AM  Measured From Lips 11/20/2017  9:54 AM  Secured Location Right 11/20/2017  9:54 AM  Secured By [employer] 11/20/2017  9:54 AM  Tube Holder Repositioned Yes 11/20/2017  9:54 AM  Site Condition Dry 11/20/2017  4:00 AM    Evaluation  O2 sats: stable throughout Complications: No apparent complications Patient did tolerate procedure well. Bilateral Breath Sounds: Clear, Diminished   Yes  Dimple Nanas 11/20/2017, 11:23 AM

## 2017-11-20 NOTE — Progress Notes (Signed)
PHARMACY - PHYSICIAN COMMUNICATION CRITICAL VALUE ALERT - BLOOD CULTURE IDENTIFICATION (BCID)  [name] is an 67 y.o. female who presented to [facility] on 11/18/2017 with a chief complaint of DKA.  Assessment:  ABX initiated with concern for sepsis, WBC now improved and pt afebrile. Blood cultures growing 1o2 CoNS. Recommend holding off on antibiotics for now, if pt shows S/Sx infection would recommend cefazolin.  Name of physician (or Provider) Contacted: Dr. Ignacia Marvel (PCCM resident)  Current antibiotics: none  Changes to prescribed antibiotics recommended:  Recommendations accepted by provider  Results for orders placed or performed during the hospital encounter of 11/18/17  Blood Culture ID Panel (Reflexed) (Collected: 11/18/2017  9:02 PM)  Result Value Ref Range   Enterococcus species NOT DETECTED NOT DETECTED   Listeria monocytogenes NOT DETECTED NOT DETECTED   Staphylococcus species DETECTED (A) NOT DETECTED   Staphylococcus aureus NOT DETECTED NOT DETECTED   Methicillin resistance NOT DETECTED NOT DETECTED   Streptococcus species NOT DETECTED NOT DETECTED   Streptococcus agalactiae NOT DETECTED NOT DETECTED   Streptococcus pneumoniae NOT DETECTED NOT DETECTED   Streptococcus pyogenes NOT DETECTED NOT DETECTED   Acinetobacter baumannii NOT DETECTED NOT DETECTED   Enterobacteriaceae species NOT DETECTED NOT DETECTED   Enterobacter cloacae complex NOT DETECTED NOT DETECTED   Escherichia coli NOT DETECTED NOT DETECTED   Klebsiella oxytoca NOT DETECTED NOT DETECTED   Klebsiella pneumoniae NOT DETECTED NOT DETECTED   Proteus species NOT DETECTED NOT DETECTED   Serratia marcescens NOT DETECTED NOT DETECTED   Haemophilus influenzae NOT DETECTED NOT DETECTED   Neisseria meningitidis NOT DETECTED NOT DETECTED   Pseudomonas aeruginosa NOT DETECTED NOT DETECTED   Candida albicans NOT DETECTED NOT DETECTED   Candida glabrata NOT DETECTED NOT DETECTED   Candida krusei NOT  DETECTED NOT DETECTED   Candida parapsilosis NOT DETECTED NOT DETECTED   Candida tropicalis NOT DETECTED NOT DETECTED    Arrie Senate, PharmD, BCPS PGY-2 Cardiology Pharmacy Resident Pager: [phone] 11/20/2017

## 2017-11-20 NOTE — Progress Notes (Addendum)
PULMONARY  / CRITICAL CARE MEDICINE  Name: [name] MRN: [id number] DOB: 1951-06-30    LOS: 2  REFERRING MD :  EDP Lita Mains  CHIEF COMPLAINT:  DKA w/ respiratory arrest  BRIEF PATIENT DESCRIPTION: 67 y/o woman with Hx of IDDM, hypothyroidism, G1DD (LVEF 65-70% 09/08/17), who recently underwent lumbar spine surgery on 4/12 taken to [facility] emergently for respiratory arrest en route, found to be in DKA with AG 29, BS > 700, pH 6.8 pCO2 below detectable range. She was transferred to [facility] ICU on mechanical ventilation. Levophed was started for initial hypotension, treatment for DKA, and empiric antibiotics. Her family reports some concern about missed ot extra medications due to polypharmacy (19 pills, insulin injections).  LINES / TUBES: Endotracheal tube 4/13>>> LIJ CVC 4/13>>> Foley catheter 4/13>>>  CULTURES: 4/13 Blood Cx: NGTD 4/14 Resp Cx: Stain - GPC in clusters, Cx pending  ANTIBIOTICS: Cefepime 4/13-4/14  SIGNIFICANT EVENTS:  4/13 Admitted intubated for respiratory arrest   INTERVAL HISTORY:  Metabolic derangements improved yesterday. Unable to extubate due to mental status in PM. CBGs moderately uncontrolled again o/n otherwise no acute events. Sedated on higher precedex this AM.  VITAL SIGNS: Temp:  [99.7 F (37.6 C)-101.3 F (38.5 C)] 99.7 F (37.6 C) (04/15 0600) Pulse Rate:  [68-110] 68 (04/15 0600) Resp:  [18-37] 18 (04/15 0600) BP: (96-147)/(34-69) 107/56 (04/15 0600) SpO2:  [97 %-100 %] 99 % (04/15 0600) Arterial Line BP: (120-180)/(56-70) 120/56 (04/14 1930) FiO2 (%):  [40 %] 40 % (04/15 0400) Weight:  [214 lb 4.6 oz (97.2 kg)] 214 lb 4.6 oz (97.2 kg) (04/15 0420) HEMODYNAMICS:   VENTILATOR SETTINGS: Vent Mode: PRVC FiO2 (%):  [40 %] 40 % Set Rate:  [12 bmp] 12 bmp Vt Set:  [540 mL] 540 mL PEEP:  [5 cmH20] 5 cmH20 Pressure Support:  [5 cmH20-8 cmH20] 5 cmH20 Plateau Pressure:  [16 cmH20-19 cmH20] 16 cmH20 INTAKE / OUTPUT: Intake/Output    04/14 0701 - 04/15 0700 04/15 0701 - 04/16 0700   P.O. 0    I.V. (mL/kg) 3050.6 (31.4)    Blood     NG/GT 60    IV Piggyback 200    Total Intake(mL/kg) 3310.6 (34.1)    Urine (mL/kg/hr) 3315 (1.4)    Emesis/NG output 450    Total Output 3765    Net -454.4          PHYSICAL EXAMINATION: General:  Intubated, sedated Neuro:  Does not open eyes but nodding head to voice, follows some specific commands but not others, withdraws HEENT:  PERRL, no JVD Cardiovascular:  RRR, no murmurs Lungs:  Good air movement b/l, vent assisted breaths Abdomen:  Soft, grimaces to pressure over any quadrant Musculoskeletal:  No peripheral edema, 2+ peripheral pulses Skin:  No rashes  LABS: Cbc Recent Labs  Lab 11/19/17 0526 11/19/17 0632 11/20/17 0429  WBC 12.5* 12.1* 6.3  HGB 8.2* 8.3* 7.8*  HCT 27.7* 27.5* 26.0*  PLT 221 213 146*    Chemistry  Recent Labs  Lab 11/18/17 1528 11/18/17 2105  11/19/17 1019 11/19/17 1910 11/19/17 2258 11/20/17 0429  NA 133* 136   < > 146* 146* 149* 151*  K 6.9* 5.3*   < > 4.0 4.0 4.5 4.1  CL 100* 106   < > 115* 115* 119* 120*  CO2 4* <7*   < > 20* 17* 20* 24  BUN 54* 57*   < > 43* 38* 34* 32*  CREATININE 2.99* 3.62*   < >  2.00* 1.86* 1.79* 1.56*  CALCIUM 8.3* 7.9*   < > 7.9* 7.7* 8.0* 8.0*  MG 3.1* 2.6*  --   --   --   --   --   PHOS  --  7.3*  --  2.6  --   --   --   GLUCOSE 741* 605*   < > 155* 288* 308* 267*   < > = values in this interval not displayed.    Liver fxn Recent Labs  Lab 11/18/17 1528 11/18/17 2105  AST 24 47*  ALT 18 28  ALKPHOS 104 109  BILITOT 1.9* 1.8*  PROT 6.0* 5.6*  ALBUMIN 3.0* 2.9*   coags Recent Labs  Lab 11/18/17 1528  APTT 31  INR 1.57   Sepsis markers Recent Labs  Lab 11/18/17 2032 11/18/17 2105 11/19/17 0046  LATICACIDVEN 1.5  --  1.5  PROCALCITON  --  4.08  --    Cardiac markers Recent Labs  Lab 11/19/17 0417 11/19/17 1019 11/19/17 1617  TROPONINI 3.10* 3.20* 2.41*   BNP No results for  input(s): PROBNP in the last 168 hours. ABG Recent Labs  Lab 11/18/17 1612 11/18/17 2251 11/19/17 0435  PHART 6.811* 7.041* 7.364  PCO2ART CRITICAL RESULT CALLED TO, READ BACK BY AND VERIFIED WITH: 23.3* 38.3  PO2ART 116.0* 347.0* 200*  HCO3  --  6.3* 21.0  TCO2  --  7*  --     CBG trend Recent Labs  Lab 11/19/17 1255 11/19/17 1720 11/19/17 2020 11/20/17 0019 11/20/17 0409  GLUCAP 228* 246* 280* 260* 248*    IMAGING: 4/13 CXR: ETT in place, low lung volumes, perihilar atelectasis, no consolidation or obvious edema 4/13 Abd: OG tube terminating in gastric antrum 4/13 CT Head: no acute abnormality  ECG: 4/14: Sinus tach, QTc 428, no new ST elevation or T wave inversion  DIAGNOSES: Active Problems:   DKA (diabetic ketoacidoses) (HCC)   Diabetic ketoacidosis (HCC)   Acute respiratory failure with hypoxia (HCC)   Acute renal failure (HCC)   Endotracheal tube present   Septic shock (HCC)  ASSESSMENT / PLAN:  PULMONARY A: Acute respiratory failure seems 2/2 severe metabolic acidosis, obtundation RR very rapid at initial SBT P:   On PRVC 8cc/kg SBT again today, likely extubation candidate Repeat CXR, resp culture positive vs colonizing flora - without focal infiltrate  CARDIOVASCULAR A: BP well controlled now Troponin peaked at 3.2 likely demand ischemia EKG unchanged besides sinus tach P:  Holding ARB for AKI  RENAL A: AGMA 2/2 DKA, now improved CBG >600 on arrival now transitioned to D5 IVF AKI improving: BUN 32 SCr 1.56 Good UOP P:   Decrease Bmet frequency q6hr today Continue IVF per DKA phase 2 Follow UOP foley catheter Bmet to daily  GASTROINTESTINAL A:  ?Abdominal pain on exam OGT draining normal gastric contents P:   SUP  HEMATOLOGIC A: Hgb 7.8 Acute on chronic anemia s/p 2 units pRBCs - appropriate correction Leukocytosis improved Ferritin 58 P:  Reactive thrombocytosis, improving, normal VTE ppx  INFECTIOUS A: Leukocytosis  resolved, borderline temp No reported sxs Tmax 101.3 now trending down Resp gram stain GPC in clusters, BCID coag neg staph likely contaminant P:   WBC, fever improved Hold abtx and observed  ENDOCRINE A: NPO 2/2 intubated status Glucose moderately uncontrolled again o/n P:   Increase basal lantus q4hrs CBGs SSI-resistant Continue D5 fluids until diet or TFs  NEUROLOGIC A:  Sedated on precedex today Restless legs P:   Wean sedation for  probable extubation today RASS goal 0   Pulmonary and [place] Pager: [phone]  11/20/2017, 8:12 AM

## 2017-11-21 ENCOUNTER — Inpatient Hospital Stay (HOSPITAL_COMMUNITY): Payer: Medicare Other

## 2017-11-21 DIAGNOSIS — I639 Cerebral infarction, unspecified: Secondary | ICD-10-CM

## 2017-11-21 DIAGNOSIS — G92 Toxic encephalopathy: Secondary | ICD-10-CM

## 2017-11-21 LAB — GLUCOSE, CAPILLARY
GLUCOSE-CAPILLARY: 109 mg/dL — AB (ref 65–99)
GLUCOSE-CAPILLARY: 151 mg/dL — AB (ref 65–99)
GLUCOSE-CAPILLARY: 68 mg/dL (ref 65–99)
GLUCOSE-CAPILLARY: 73 mg/dL (ref 65–99)
GLUCOSE-CAPILLARY: 79 mg/dL (ref 65–99)
Glucose-Capillary: 85 mg/dL (ref 65–99)
Glucose-Capillary: 132 mg/dL — ABNORMAL HIGH (ref 65–99)
Glucose-Capillary: 150 mg/dL — ABNORMAL HIGH (ref 65–99)

## 2017-11-21 LAB — HEPATIC FUNCTION PANEL
ALBUMIN: 2.5 g/dL — AB (ref 3.5–5.0)
ALT: 30 U/L (ref 14–54)
AST: 26 U/L (ref 15–41)
Alkaline Phosphatase: 80 U/L (ref 38–126)
Bilirubin, Direct: 0.2 mg/dL (ref 0.1–0.5)
Indirect Bilirubin: 0.5 mg/dL (ref 0.3–0.9)
TOTAL PROTEIN: 5.3 g/dL — AB (ref 6.5–8.1)
Total Bilirubin: 0.7 mg/dL (ref 0.3–1.2)

## 2017-11-21 LAB — CULTURE, RESPIRATORY

## 2017-11-21 LAB — BASIC METABOLIC PANEL
ANION GAP: 9 (ref 5–15)
Anion gap: 8 (ref 5–15)
BUN: 18 mg/dL (ref 6–20)
BUN: 13 mg/dL (ref 6–20)
CALCIUM: 8.3 mg/dL — AB (ref 8.9–10.3)
CO2: 27 mmol/L (ref 22–32)
CO2: 26 mmol/L (ref 22–32)
CREATININE: 0.77 mg/dL (ref 0.44–1.00)
Calcium: 8.6 mg/dL — ABNORMAL LOW (ref 8.9–10.3)
Chloride: 119 mmol/L — ABNORMAL HIGH (ref 101–111)
Chloride: 112 mmol/L — ABNORMAL HIGH (ref 101–111)
Creatinine, Ser: 0.84 mg/dL (ref 0.44–1.00)
GFR calc non Af Amer: >60 mL/min (ref >60)
Glucose, Bld: 85 mg/dL (ref 65–99)
Glucose, Bld: 134 mg/dL — ABNORMAL HIGH (ref 65–99)
Potassium: 2.9 mmol/L — ABNORMAL LOW (ref 3.5–5.1)
Potassium: 3.2 mmol/L — ABNORMAL LOW (ref 3.5–5.1)
SODIUM: 155 mmol/L — AB (ref 135–145)
SODIUM: 146 mmol/L — AB (ref 135–145)

## 2017-11-21 LAB — CBC
HCT: 29.4 % — ABNORMAL LOW (ref 36.0–46.0)
HEMOGLOBIN: 8.6 g/dL — AB (ref 12.0–15.0)
MCH: 20.1 pg — AB (ref 26.0–34.0)
MCHC: 29.3 g/dL — ABNORMAL LOW (ref 30.0–36.0)
MCV: 68.7 fL — ABNORMAL LOW (ref 78.0–100.0)
Platelets: 102 10*3/uL — ABNORMAL LOW (ref 150–400)
RBC: 4.28 MIL/uL (ref 3.87–5.11)
RDW: 22.1 % — ABNORMAL HIGH (ref 11.5–15.5)
WBC: 7.4 10*3/uL (ref 4.0–10.5)

## 2017-11-21 LAB — CULTURE, RESPIRATORY W GRAM STAIN

## 2017-11-21 LAB — LIPASE, BLOOD: LIPASE: 35 U/L (ref 11–51)

## 2017-11-21 MED ORDER — LEVOTHYROXINE SODIUM 100 MCG IV SOLR
75.0000 ug | Freq: Every day | INTRAVENOUS | Status: DC
  Administered 2017-11-21 – 2017-11-23 (×3): 75 ug via INTRAVENOUS
  Filled 2017-11-21 (×5): qty 5

## 2017-11-21 MED ORDER — WHITE PETROLATUM EX OINT
TOPICAL_OINTMENT | CUTANEOUS | Status: AC
  Administered 2017-11-21: 0.2
  Filled 2017-11-21: qty 28.35

## 2017-11-21 MED ORDER — POTASSIUM CHLORIDE 10 MEQ/50ML IV SOLN: 10.0000 meq | INTRAVENOUS | Status: DC

## 2017-11-21 MED ORDER — POTASSIUM CHLORIDE 10 MEQ/50ML IV SOLN
10.0000 meq | INTRAVENOUS | Status: AC
  Administered 2017-11-21 (×6): 10 meq via INTRAVENOUS
  Filled 2017-11-21 (×6): qty 50

## 2017-11-21 MED ORDER — HEPARIN SODIUM (PORCINE) 5000 UNIT/ML IJ SOLN
5000.0000 [IU] | Freq: Three times a day (TID) | INTRAMUSCULAR | Status: DC
  Administered 2017-11-21 – 2017-11-22 (×4): 5000 [IU] via SUBCUTANEOUS
  Filled 2017-11-21 (×4): qty 1

## 2017-11-21 MED ORDER — DEXTROSE 5 % IV SOLN
INTRAVENOUS | Status: AC
  Administered 2017-11-21 (×3): via INTRAVENOUS

## 2017-11-21 MED ORDER — FENTANYL CITRATE (PF) 100 MCG/2ML IJ SOLN
50.0000 ug | INTRAMUSCULAR | Status: DC | PRN
  Administered 2017-11-21 – 2017-11-22 (×8): 50 ug via INTRAVENOUS
  Filled 2017-11-21 (×8): qty 2

## 2017-11-21 MED ORDER — LORAZEPAM 2 MG/ML IJ SOLN
0.5000 mg | Freq: Four times a day (QID) | INTRAMUSCULAR | Status: DC
  Administered 2017-11-21 – 2017-11-22 (×3): 0.5 mg via INTRAVENOUS
  Filled 2017-11-21 (×4): qty 1

## 2017-11-21 MED ORDER — CEFAZOLIN SODIUM-DEXTROSE 1-4 GM/50ML-% IV SOLN
1.0000 g | Freq: Three times a day (TID) | INTRAVENOUS | Status: DC
  Administered 2017-11-21 – 2017-11-25 (×13): 1 g via INTRAVENOUS
  Filled 2017-11-21 (×16): qty 50

## 2017-11-21 MED ORDER — ASPIRIN 81 MG PO CHEW: 81.0000 mg | CHEWABLE_TABLET | Freq: Every day | ORAL | Status: DC

## 2017-11-21 MED ORDER — POTASSIUM CHLORIDE 2 MEQ/ML IV SOLN
INTRAVENOUS | Status: DC
  Administered 2017-11-21: 08:00:00 via INTRAVENOUS
  Filled 2017-11-21: qty 1000

## 2017-11-21 MED ORDER — LABETALOL HCL 5 MG/ML IV SOLN: 10.0000 mg | INTRAVENOUS | Status: DC | PRN

## 2017-11-21 NOTE — Progress Notes (Signed)
SAME DAY FOLLOW UP  MRI completed. Formal read pending. Multiple areas of restricted diffusion seen on both cerebral hemispheres - ?embolic vs watershed strokes. One small hypodensity on CT was not enough to explain AMS, but multiple embolic strokes as seen on MRI can contribute to AMS in the setting of metabolic derangements, benzo withdrawal, and acute respiratory failure. Will need stroke work up as below in addition to recs from earlier in the day in the consult.:  RECS: -Telemetry monitoring -Allow for permissive hypertension for the first 24-48h - only treat PRN if SBP >220 mmHg. Blood pressures can be gradually normalized to SBP<140 upon discharge. -CT Angiogram of Head and neck -Echocardiogram -HgbA1c, fasting lipid panel -Frequent neuro checks -Prophylactic therapy-Antiplatelet med: Aspirin - dose 325mg  PO or 300mg  PR -Atorvastatin 80 mg PO daily -Risk factor modification -PT consult, OT consult, Speech consult -If Afib found on telemetry, will need anticoagulation. Decision pending imaging and stroke team rounding.  Please page stroke NP/PA/MD (listed on AMION)  from 8am-4 pm as this patient will be followed by the stroke team at this point.  -- Amie Portland, MD [facility] Pager: [phone] If 7pm to 7am, please call on call as listed on AMION.

## 2017-11-21 NOTE — Consult Note (Addendum)
Referring Physician: Kandice Hams, MD    Chief Complaint: Respiratory Arrest   HPI: [name] is an 67 y.o. female Hx of IDDM, hypothyroidism, G1DD (LVEF 65-70% 09/08/17), who recently underwent lumbar spine surgery on 4/12 taken to [facility] emergently for respiratory arrest en route, found to be in DKA with AG 29, BS > 700, pH 6.8 pCO2 below detectable range. She was transferred to [place] ICU on mechanical ventilation, and started on pressors.  Empiric antibiotics and treatment for DKA.Marland Kitchen  Patient was extubated on 11/20/2017.  On admission CT of the head did not reveal any acute abnormalities.  Since extubation patient continued to have persistent encephalopathy versus agitated delirium which was thought to have unclear etiology of symptoms metabolic derangements have since improved and she is under treatment.  Repeat CT of the head was done on 11/21/2017 which revealed  New small hypodensity posterosuperior right frontal lobe possibly small acute infarct.  Neurology was consulted for further evaluation.  Assessment patient has easily arousable, impaired mental status, and diminished participation.  She is minimally able to follow commands with equal strength in bilateral upper and lower face.  Patient continues to perseverate on her answers and perseverate on her back pain.  She appears to have knowledge of her home medications as well as frequency and how long she has been taking certain meds.  HPI and review of systems were not accurately obtain  from patient due to altered mental status but patient denies headache, dizziness or blurred vision, but admits to abdominal pain and back pain and able to independently move around the bed for comfort.  ROS: Can not be obtained reliably due to patient's mental status   Past Medical History:  Diagnosis Date  . Anemia    iron deficiency  . Arthritis   . Blood transfusion   . Cervical spondylosis with myelopathy and radiculopathy   . Depression   .  Diabetes mellitus    Type II  . Diastolic dysfunction   . Diverticulitis   . DJD (degenerative joint disease)   . DVT (deep venous thrombosis) (HCC)    times 2 lower leg  . Endometriosis   . Family history of adverse reaction to anesthesia    mom and dad PONV  . Fibromyalgia   . GERD (gastroesophageal reflux disease)    occ  . Hiatal hernia   . History of kidney stones   . Hyperlipidemia   . Hypertension   . Hypothyroidism   . Insomnia   . Mild aortic insufficiency    by echo 04/2013  . Osteoarthritis    back and knee  . Ovarian cyst   . PONV (postoperative nausea and vomiting)   . RLS (restless legs syndrome)   . Thoracic compression fracture ([place])   . Uterine fibroid   . UTI (lower urinary tract infection)   . Villous adenoma of right colon 04/08/2016  . Vitamin D deficiency disease     Past Surgical History:  Procedure Laterality Date  . ANTERIOR CERVICAL DECOMP/DISCECTOMY FUSION N/A 03/23/2017   Procedure: ANTERIOR CERVICAL DECOMPRESSION/DISCECTOMY FUSION, INTERBODY PROSTHESIS,PLATE CERVICAL THREE- CERVICAL FOUR, CERVICAL FOUR- CERVICAL FIVE, CERVICAL FIVE- CERVICAL SIX;  Surgeon: Newman Pies, MD;  Location: [facility];  Service: Neurosurgery;  Laterality: N/A;  . APPENDECTOMY    . BACK SURGERY  ,2005   April  2013 - spinal fusion[facility]  . BREAST SURGERY     breast reduction  . CARDIAC CATHETERIZATION  04/2013   normal coronary arteries and  normal LVF  . CARPAL TUNNEL RELEASE  06/05/2012   Procedure: CARPAL TUNNEL RELEASE;  Surgeon: Cammie Sickle., MD;  Location: [facility];  Service: Orthopedics;  Laterality: Left;  . CESAREAN SECTION     x 2  . CHOLECYSTECTOMY    . COLECTOMY  04/08/2016  . DILATION AND CURETTAGE OF UTERUS    . DORSAL COMPARTMENT RELEASE  06/05/2012   Procedure: RELEASE DORSAL COMPARTMENT (DEQUERVAIN);  Surgeon: Cammie Sickle., MD;  Location: [facility];  Service: Orthopedics;  Laterality: Left;  Excision of  mixoid cyst also  . EYE SURGERY     cataracts bilateral  . JOINT REPLACEMENT     Bilateral knee  . KNEE ARTHROPLASTY  09   lft partial  . KNEE ARTHROPLASTY     rt  . LAPAROSCOPIC PARTIAL COLECTOMY N/A 04/08/2016   Procedure: LAPAROSCOPIC ASSISTED ASCENDING COLECTOMY POSSIBLE OPEN COLECTOMY;  Surgeon: Fanny Skates, MD;  Location: [place];  Service: General;  Laterality: N/A;  . orthopedic surgeries     multiple  . REDUCTION MAMMAPLASTY Bilateral   . SHOULDER OPEN ROTATOR CUFF REPAIR     rt and lft  . TUBAL LIGATION     btsp    Family History  Problem Relation Age of Onset  . Heart attack Father        49s  . Hypothyroidism Father   . Diabetes type II Father   . Diabetes type II Mother   . Hypertension Mother   . Breast cancer Mother 33  . Hypothyroidism Brother   . Breast cancer Maternal Aunt   . Breast cancer Cousin   . Breast cancer Maternal Aunt   . Breast cancer Cousin    Social History:  reports that she has never smoked. She has never used smokeless tobacco. She reports that she does not drink alcohol or use drugs.  Allergies:  Allergies  Allergen Reactions  . Codeine Nausea And Vomiting  . Ambien [Zolpidem Tartrate] Other (See Comments)    Up sleep walking and eating  . Crestor [Rosuvastatin] Other (See Comments)    Muscle weakness, cramps and aching all over body  . Lipitor [Atorvastatin] Itching and Other (See Comments)    Muscle weakness, cramps and aches all over body  . Morphine And Related Other (See Comments)    Flushing and feeling hot Tolerates with Diphenhydramine  . Penicillins Rash and Other (See Comments)    Caused Headaches also Has patient had a PCN reaction causing immediate rash, facial/tongue/throat swelling, SOB or lightheadedness with hypotension: No Has patient had a PCN reaction causing severe rash involving mucus membranes or skin necrosis: No Has patient had a PCN reaction that required hospitalization No Has patient had a PCN  reaction occurring within the last 10 years: No If all of the above answers are "NO", then may proceed with Cephalosporin use.    . Statins Other (See Comments)    Muscle weakness, cramps and aching all over body  . Dilaudid [Hydromorphone Hcl] Other (See Comments)    Hallucinations/ argumentative, goes beserk  . Amitriptyline Other (See Comments)    Loopy feeling  . Ceftin [Cefuroxime Axetil] Other (See Comments)    Stomach pain   . Lovaza [Omega-3-Acid Ethyl Esters] Other (See Comments)    Stomach pain   . Lunesta [Eszopiclone] Other (See Comments)    Causes dizziness  . Lyrica [Pregabalin] Nausea Only  . Metformin And Related Nausea Only    Pt denies this  intolerance    Medications:  . amLODipine  5 mg Per Tube Daily  . aspirin  81 mg Oral Daily  . chlorhexidine gluconate (MEDLINE KIT)  15 mL Mouth Rinse BID  . heparin  5,000 Units Subcutaneous Q8H  . insulin aspart  0-20 Units Subcutaneous Q4H  . insulin glargine  40 Units Subcutaneous Daily  . mouth rinse  15 mL Mouth Rinse QID  . pantoprazole sodium  40 mg Per Tube QHS    ROS: Unable to accurately obtain secondary to patient's altered mental status  Physical Examination: Blood pressure (!) 158/92, pulse 93, temperature 98.6 F (37 C), temperature source Oral, resp. rate (!) 32, height '5\' 3"'  (1.6 m), weight 94 kg (207 lb 3.7 oz), SpO2 97 %. HEENT-  Normocephalic, no lesions, without obvious abnormality.  Normal external eye and conjunctiva.  Dry mucous membranes  Cardiovascular-tachycardic rate and rhythm S1-S2 audible, pulses palpable throughout   Lungs-coarse breath sounds diminished in the bases, no excessive working breathing.  Saturations within normal limits Abdomen-tender in the left upper and lower quadrant with normal bowel sounds  Musculoskeletal-no joint tenderness, deformity or swelling, patient with back pain Skin-warm and dry,  Neurological Examination Mental Status: Alert to self and place, does not  know date, month or year, patient with altered mental status and minimally able to follow commands.  Patient perseverating on answers, no slurred speech noted cranial Nerves: II: Blinks to threat, unable to accurately assess visual fields this patient with decreased participation III,IV, VI: ptosis not present,  pupils equal, round, reactive to light  V,VII: smile symmetric, facial light touch sensation normal bilaterally VIII: Hearing intact to voice IX,X: uvula rises symmetrically XI: bilateral shoulder shrug XII: midline tongue extension Motor: Right : Upper extremity   5/5    Left:     Upper extremity   5/5  Lower extremity   5/5     Lower extremity   5/5 Tone and bulk:normal tone throughout; no atrophy noted Sensory: Pinprick and light touch intact throughout, bilaterally Deep Tendon Reflexes: Patient with 2-3 beats of clonus in the left leg , +3 and brisk on  left patella with cross adduction on the left,  +2 on the right as well as +2 on brachioradialis and biceps bilaterally Plantars: Right: downgoing   Left: downgoing Cerebellar: Unable to accurately assess due to patient altered mental status and diminshed participation.  Gait: Deferred  Ct Head Wo Contrast  11/21/2017 Impression 1. New small hypodensity posterosuperior right frontal lobe possibly small acute infarct. No intracranial hemorrhage. Mild atrophy. Exophthalmos. Pansinus mucosal thickening/partial opacification. Partial opacification mastoid air cells bilaterally. No obstructing lesion of eustachian tube noted.    Assessment: 67 y.o. female with Hx of IDDM, hypothyroidism, G1DD (LVEF 65-70% 09/08/17), who recently underwent lumbar spine surgery on 4/12 taken to [facility] emergently for respiratory arrest en route, found to be in DKA with AG 29, BS > 700, pH 6.8 pCO2 below detectable range. She was transferred to [facility] ICU on mechanical ventilation. Levophed was started for initial hypotension, treatment for DKA, and empiric  antibiotics.  Since extubation patient continued to have persistent encephalopathy versus agitated delirium which was thought to have unclear etiology of symptoms metabolic derangements have since improved and she is under treatment.  1. Rule out stroke in patient with metabolic encephalopathy.  Patient presented with altered mental status as well as respiratory failure and status post intubation.  CT head imaging showed new small hypodensity posterosuperior right frontal lobe possibly small acute  infarct. We will recommend MRI of the brain to rule out stroke and also evaluate for hypoxic/anoxic brain injury given h/o respiratory arrest. Will need stroke risk factor work-up IF MRI +ve for stroke.  Currently on aspirin 61m 2. Metabolic encephalopathy in patient who presented with multiple medical comorbidities, possible benzo withdrawal, ICU delirium, DKA, acute respiratory failure, acute renal insufficiency. Infectious work-up patient is work-up unremarkable.  Patient with possible underlying baseline congnitive  impairment 3.  Possible Benzo withdrawal-patient has been on Restoril long-term and currently abruptly stopped recommend resuming low-dose benzo or resume temazepam. 4.  Acute respiratory failure patient was intubated currently extubated 5.  Hypernatremia 6. Acute anemia 7. DKA -resolving  Stroke Risk Factors - hyperlipidemia and hypertension  Plan: - MRI of the brain without contrast - Resume low dose Benzo, pt was on chronic Temazepam (can do parenteral ativan or diazepam till she is NPO and then resume home dose restoril) - Telemetry monitoring - Frequent neuro checks - Correct electrolyte imbalances and metabolic derangements monitor for improvement of mental status  PLetha CapeDNP Neuro-hospitalist Team [phone]/16/2019, 12:41 PM   Attending Neurohospitalist Addendum Patient seen and examined with APP/Resident. Agree with the history and physical as documented  above. Agree with the plan as documented, which I helped formulate. I have independently reviewed the chart, obtained history, review of systems and examined the patient.I have personally reviewed pertinent head/neck/spine imaging (CT/MRI). [facility]. Further evaluation with MRI brain. Correct hypernatremia. Plan relayed to team resident Dr. RBenjamine Molaover the phone. Please feel free to call with any questions. --- AAmie Portland MD [facility] Pager: [phone] If 7pm to 7am, please call on call as listed on AMION.

## 2017-11-21 NOTE — Progress Notes (Addendum)
PULMONARY  / CRITICAL CARE MEDICINE  Name: [name] MRN: [id number] DOB: 1950-11-02    LOS: 34  REFERRING MD :  EDP Lita Mains  CHIEF COMPLAINT:  DKA w/ respiratory arrest  BRIEF PATIENT DESCRIPTION: 67 y/o woman with Hx of IDDM, hypothyroidism, G1DD (LVEF 65-70% 09/08/17), who recently underwent lumbar spine surgery on 4/12 taken to [facility] emergently for respiratory arrest en route, found to be in DKA with AG 29, BS > 700, pH 6.8 pCO2 below detectable range. She was transferred to [facility] ICU on mechanical ventilation. Levophed was started for initial hypotension, treatment for DKA, and empiric antibiotics. Her family reports some concern about missed ot extra medications due to polypharmacy (19 pills, insulin injections).  LINES / TUBES: LIJ CVC 4/13>>> Foley catheter 4/13>>>  CULTURES: 4/13 Blood Cx: Staph epi 1 out of 2 4/14 Resp Cx: Stain - GPC in clusters, Cx pending  ANTIBIOTICS: Cefepime 4/13-4/14  SIGNIFICANT EVENTS:  4/13 Admitted intubated for respiratory arrest 4/15 Extubated  INTERVAL HISTORY:  She was extubated yesterday with good respiratory effort since then but with continued encephalopathy. Appears to have agitated delirium although more alert and interactive than yesterday. Glucose became low overnight requiring IV dextrose pushes x2.  VITAL SIGNS: Temp:  [97.7 F (36.5 C)-100.2 F (37.9 C)] 97.7 F (36.5 C) (04/16 0900) Pulse Rate:  [84-106] 93 (04/16 0800) Resp:  [4-42] 32 (04/16 0800) BP: (139-179)/(55-110) 158/92 (04/16 1020) SpO2:  [93 %-100 %] 97 % (04/16 0800) Weight:  [207 lb 3.7 oz (94 kg)] 207 lb 3.7 oz (94 kg) (04/16 0500) HEMODYNAMICS:   VENTILATOR SETTINGS:   INTAKE / OUTPUT: Intake/Output      04/15 0701 - 04/16 0700 04/16 0701 - 04/17 0700   P.O.     I.V. (mL/kg) 955.1 (10.2) 40 (0.4)   Other 50    NG/GT 120    IV Piggyback  50   Total Intake(mL/kg) 1125.1 (12) 90 (1)   Urine (mL/kg/hr) 3740 (1.7)    Emesis/NG output     Stool 0     Total Output 3740    Net -2614.9 +90        Stool Occurrence 7 x     PHYSICAL EXAMINATION: General: Moving around uncomfortably in bed Neuro: Eyes closed but awake, opens and attends to voice, follows commands although slightly inconsistently, appears to have some right gaze preference HEENT: PERRL, no JVD, dry oral mucosa Cardiovascular: RRR, no murmurs Lungs: Good air movement b/l, no inspiratory crackles Abdomen: Soft, bowel sounds present, grimaces to pressure over any quadrant Musculoskeletal: No peripheral edema, 2+ peripheral pulses Skin: No rashes, foam pad and honeycomb dressing in place on back  LABS: Cbc Recent Labs  Lab 11/19/17 0632 11/20/17 0429 11/21/17 0515  WBC 12.1* 6.3 7.4  HGB 8.3* 7.8* 8.6*  HCT 27.5* 26.0* 29.4*  PLT 213 146* 102*    Chemistry  Recent Labs  Lab 11/18/17 1528 11/18/17 2105  11/19/17 1019  11/19/17 2258 11/20/17 0429 11/21/17 0515  NA 133* 136   < > 146*   < > 149* 151* 155*  K 6.9* 5.3*   < > 4.0   < > 4.5 4.1 2.9*  CL 100* 106   < > 115*   < > 119* 120* 119*  CO2 4* <7*   < > 20*   < > 20* 24 27  BUN 54* 57*   < > 43*   < > 34* 32* 18  CREATININE 2.99* 3.62*   < >  2.00*   < > 1.79* 1.56* 0.84  CALCIUM 8.3* 7.9*   < > 7.9*   < > 8.0* 8.0* 8.6*  MG 3.1* 2.6*  --   --   --   --   --   --   PHOS  --  7.3*  --  2.6  --   --   --   --   GLUCOSE 741* 605*   < > 155*   < > 308* 267* 85   < > = values in this interval not displayed.    Liver fxn Recent Labs  Lab 11/18/17 1528 11/18/17 2105 11/21/17 0515  AST 24 47* 26  ALT 18 28 30   ALKPHOS 104 109 80  BILITOT 1.9* 1.8* 0.7  PROT 6.0* 5.6* 5.3*  ALBUMIN 3.0* 2.9* 2.5*   coags Recent Labs  Lab 11/18/17 1528  APTT 31  INR 1.57   Sepsis markers Recent Labs  Lab 11/18/17 2032 11/18/17 2105 11/19/17 0046  LATICACIDVEN 1.5  --  1.5  PROCALCITON  --  4.08  --    Cardiac markers Recent Labs  Lab 11/19/17 0417 11/19/17 1019 11/19/17 1617  TROPONINI 3.10*  3.20* 2.41*   BNP No results for input(s): PROBNP in the last 168 hours. ABG Recent Labs  Lab 11/18/17 1612 11/18/17 2251 11/19/17 0435  PHART 6.811* 7.041* 7.364  PCO2ART CRITICAL RESULT CALLED TO, READ BACK BY AND VERIFIED WITH: 23.3* 38.3  PO2ART 116.0* 347.0* 200*  HCO3  --  6.3* 21.0  TCO2  --  7*  --     CBG trend Recent Labs  Lab 11/20/17 2329 11/21/17 0048 11/21/17 0348 11/21/17 0619 11/21/17 0906  GLUCAP 68 79 73 85 132*    IMAGING: 4/13 CXR: ETT in place, low lung volumes, perihilar atelectasis, no consolidation or obvious edema 4/13 Abd: OG tube terminating in gastric antrum 4/13 CT Head: no acute abnormality  ECG: 4/14: Sinus tach, QTc 428, no new ST elevation or T wave inversion  DIAGNOSES: Active Problems:   DKA (diabetic ketoacidoses) (HCC)   Diabetic ketoacidosis (HCC)   Acute respiratory failure with hypoxia (HCC)   Acute renal failure (HCC)   Endotracheal tube present   Septic shock (HCC)  ASSESSMENT / PLAN:  PULMONARY A: Acute respiratory failure 2/2 severe metabolic acidosis, obtundation RR remains elevated but does have agitation No CXR infiltrate Resp culture ?positive vs colonizing flora - without focal infiltrate P:   Maintains on [place] Caution with sedation for delirium  CARDIOVASCULAR A: BP elevated now off vent, with agitation, off home meds Troponin peaked at 3.2 likely demand ischemia EKG unchanged besides sinus tach P:  Holding ARB for AKI Limitation for PO amlodipine w/ AMS Will start PRN labetalol for HTN >170  RENAL A: AKI improving Good UOP Hypernatremia, hypokalemia P:   Replacing D5W w/ KCl Follow UOP foley catheter Bmet to daily  GASTROINTESTINAL A:  Abdominal pain on exam Normal appearing stools P:   SUP Checking LFTs   HEMATOLOGIC A: Hgb 7.8 Acute on chronic anemia s/p 2 units pRBCs - appropriate correction Leukocytosis improved Ferritin 58 P:  Reactive thrombocytosis, improving, normal VTE  ppx  INFECTIOUS A: Leukocytosis resolved Temp borderline, downtrending Resp gram stain GPC in clusters, BCID coag neg staph is likely contaminant P:   WBC, fever improved Hold abtx and observation for now  ENDOCRINE A: Hypoglycemia overnight after fluids stopped + insulin increased P:   Decrease basal lantus q4hrs CBGs SSI-resistant Back on IVF  NEUROLOGIC  A:  Appears encephalopathic vs agitated delirium Unclear etiology since metabolic derangements are improved Complaining of abdominal pain today ?contributor Restless legs P:   Needs head CT for persistent neuro changes, with cautious sedation as needed Fentanyl PRN for pain  Will consult neurology for new focal CT findings Start ASA   Pulmonary and [place] Pager: [phone]  11/21/2017, 11:33 AM

## 2017-11-21 NOTE — Evaluation (Signed)
Clinical/Bedside Swallow Evaluation Patient Details  Name: [name] MRN: [id number] Date of Birth: 01-05-1951  Today's Date: 11/21/2017 Time: SLP Start Time (ACUTE ONLY): 1448 SLP Stop Time (ACUTE ONLY): 1501 SLP Time Calculation (min) (ACUTE ONLY): 13 min  Past Medical History:  Past Medical History:  Diagnosis Date  . Anemia    iron deficiency  . Arthritis   . Blood transfusion   . Cervical spondylosis with myelopathy and radiculopathy   . Depression   . Diabetes mellitus    Type II  . Diastolic dysfunction   . Diverticulitis   . DJD (degenerative joint disease)   . DVT (deep venous thrombosis) (HCC)    times 2 lower leg  . Endometriosis   . Family history of adverse reaction to anesthesia    mom and dad PONV  . Fibromyalgia   . GERD (gastroesophageal reflux disease)    occ  . Hiatal hernia   . History of kidney stones   . Hyperlipidemia   . Hypertension   . Hypothyroidism   . Insomnia   . Mild aortic insufficiency    by echo 04/2013  . Osteoarthritis    back and knee  . Ovarian cyst   . PONV (postoperative nausea and vomiting)   . RLS (restless legs syndrome)   . Thoracic compression fracture (HCC)   . Uterine fibroid   . UTI (lower urinary tract infection)   . Villous adenoma of right colon 04/08/2016  . Vitamin D deficiency disease    Past Surgical History:  Past Surgical History:  Procedure Laterality Date  . ANTERIOR CERVICAL DECOMP/DISCECTOMY FUSION N/A 03/23/2017   Procedure: ANTERIOR CERVICAL DECOMPRESSION/DISCECTOMY FUSION, INTERBODY PROSTHESIS,PLATE CERVICAL THREE- CERVICAL FOUR, CERVICAL FOUR- CERVICAL FIVE, CERVICAL FIVE- CERVICAL SIX;  Surgeon: Tressie Stalker, MD;  Location: [facility] OR;  Service: Neurosurgery;  Laterality: N/A;  . APPENDECTOMY    . BACK SURGERY  ,2005   April  2013 - spinal fusion[facility]  . BREAST SURGERY     breast reduction  . CARDIAC CATHETERIZATION  04/2013   normal coronary arteries and normal LVF  . CARPAL TUNNEL RELEASE   06/05/2012   Procedure: CARPAL TUNNEL RELEASE;  Surgeon: Wyn Forster., MD;  Location: [facility];  Service: Orthopedics;  Laterality: Left;  . CESAREAN SECTION     x 2  . CHOLECYSTECTOMY    . COLECTOMY  04/08/2016  . DILATION AND CURETTAGE OF UTERUS    . DORSAL COMPARTMENT RELEASE  06/05/2012   Procedure: RELEASE DORSAL COMPARTMENT (DEQUERVAIN);  Surgeon: Wyn Forster., MD;  Location: [facility];  Service: Orthopedics;  Laterality: Left;  Excision of mixoid cyst also  . EYE SURGERY     cataracts bilateral  . JOINT REPLACEMENT     Bilateral knee  . KNEE ARTHROPLASTY  09   lft partial  . KNEE ARTHROPLASTY     rt  . LAPAROSCOPIC PARTIAL COLECTOMY N/A 04/08/2016   Procedure: LAPAROSCOPIC ASSISTED ASCENDING COLECTOMY POSSIBLE OPEN COLECTOMY;  Surgeon: Claud Kelp, MD;  Location: [facility] OR;  Service: General;  Laterality: N/A;  . orthopedic surgeries     multiple  . REDUCTION MAMMAPLASTY Bilateral   . SHOULDER OPEN ROTATOR CUFF REPAIR     rt and lft  . TUBAL LIGATION     btsp   HPI:  67 year old woman with brittle diabetes, hypertension with diastolic CHF, hypothyroidism, GERD, chronic pain syndrome on narcotics.  She was admitted with DKA and associated profound metabolic acidosis,  acute renal failure. CT (4/16) revealed New small hypodensity posterosuperior right frontal lobe possibly small acute infarct. Further CVA work-up in progress and MRI ordered. Pt was intubated 4/14-4/15. No hx with SLP. CXR (4/15) revealed that both lungs are clear.   Assessment / Plan / Recommendation Clinical Impression   Pt presents with minimally hoarse vocal quality and strong volitional cough. No overt signs concerning for aspiration observed with trials this session. Pt's AMS limited solid trials, with pt requiring Mod verbal cues to facilitate oral acceptance and oral manipulation. Recommend pt remain NPO with chips/sips of water following oral care and medications  administered in puree. Pt will likely be able to advance to diet with improved mentation.    SLP Visit Diagnosis: Dysphagia, unspecified (R13.10)    Aspiration Risk  Mild aspiration risk    Diet Recommendation NPO;Ice chips PRN after oral care   Medication Administration: Crushed with puree    Other  Recommendations Oral Care Recommendations: Oral care QID;Oral care prior to ice chip/H20   Follow up Recommendations Other (comment)(tbd)      Frequency and Duration min 2x/week  2 weeks       Prognosis Prognosis for Safe Diet Advancement: Good Barriers to Reach Goals: Cognitive deficits      Swallow Study   General HPI: 67 year old woman with brittle diabetes, hypertension with diastolic CHF, hypothyroidism, GERD, chronic pain syndrome on narcotics.  She was admitted with DKA and associated profound metabolic acidosis, acute renal failure. CT (4/16) revealed New small hypodensity posterosuperior right frontal lobe possibly small acute infarct. Further CVA work-up in progress and MRI ordered. Pt was intubated 4/14-4/15. No hx with SLP. CXR (4/15) revealed that both lungs are clear. Type of Study: Bedside Swallow Evaluation Previous Swallow Assessment: none in chart Diet Prior to this Study: NPO Temperature Spikes Noted: No Respiratory Status: Nasal cannula History of Recent Intubation: Yes Length of Intubations (days): 3 days Date extubated: 11/20/17 Behavior/Cognition: Cooperative;Pleasant mood Oral Cavity Assessment: Within Functional Limits Oral Care Completed by SLP: Yes Oral Cavity - Dentition: Poor condition;Adequate natural dentition Self-Feeding Abilities: Needs assist Patient Positioning: Upright in bed Baseline Vocal Quality: Hoarse(reported by son) Volitional Cough: Strong Volitional Swallow: Able to elicit    Oral/Motor/Sensory Function Overall Oral Motor/Sensory Function: Within functional limits   Ice Chips Ice chips: Within functional limits Presentation:  Spoon   Thin Liquid Thin Liquid: Within functional limits Presentation: Straw;Cup    Nectar Thick Nectar Thick Liquid: Not tested   Honey Thick Honey Thick Liquid: Not tested   Puree Puree: Within functional limits Presentation: Spoon   Solid   GO   Solid: Not tested       [place] Tanveer Dobberstein SLP Student Clinician   [place] Daneen Volcy 11/21/2017,3:32 PM

## 2017-11-21 NOTE — Progress Notes (Signed)
Patient CBG trending down. Dextrose given per protocol, CBG rechecked multiple times with consistent decrease. Patient also with increased delirium and agitation. MD made aware of concerns and advised to closely monitor CBGs. Orders for D5W to be started at 29ml/hr. Will continue to monitor.

## 2017-11-22 ENCOUNTER — Inpatient Hospital Stay (HOSPITAL_COMMUNITY): Payer: Medicare Other

## 2017-11-22 DIAGNOSIS — G9340 Encephalopathy, unspecified: Secondary | ICD-10-CM

## 2017-11-22 DIAGNOSIS — I6330 Cerebral infarction due to thrombosis of unspecified cerebral artery: Secondary | ICD-10-CM

## 2017-11-22 DIAGNOSIS — I639 Cerebral infarction, unspecified: Secondary | ICD-10-CM

## 2017-11-22 LAB — BASIC METABOLIC PANEL
Anion gap: 7 (ref 5–15)
Anion gap: 10 (ref 5–15)
BUN: 12 mg/dL (ref 6–20)
BUN: 11 mg/dL (ref 6–20)
CO2: 24 mmol/L (ref 22–32)
CO2: 24 mmol/L (ref 22–32)
CREATININE: 0.74 mg/dL (ref 0.44–1.00)
Calcium: 7.8 mg/dL — ABNORMAL LOW (ref 8.9–10.3)
Calcium: 7.9 mg/dL — ABNORMAL LOW (ref 8.9–10.3)
Chloride: 108 mmol/L (ref 101–111)
Chloride: 107 mmol/L (ref 101–111)
Creatinine, Ser: 0.94 mg/dL (ref 0.44–1.00)
GFR calc Af Amer: >60 mL/min (ref >60)
GFR calc Af Amer: >60 mL/min (ref >60)
GLUCOSE: 235 mg/dL — AB (ref 65–99)
Glucose, Bld: 178 mg/dL — ABNORMAL HIGH (ref 65–99)
POTASSIUM: 3.3 mmol/L — AB (ref 3.5–5.1)
Potassium: 5.8 mmol/L — ABNORMAL HIGH (ref 3.5–5.1)
SODIUM: 139 mmol/L (ref 135–145)
SODIUM: 141 mmol/L (ref 135–145)

## 2017-11-22 LAB — GLUCOSE, CAPILLARY
GLUCOSE-CAPILLARY: 117 mg/dL — AB (ref 65–99)
GLUCOSE-CAPILLARY: 162 mg/dL — AB (ref 65–99)
GLUCOSE-CAPILLARY: 187 mg/dL — AB (ref 65–99)
GLUCOSE-CAPILLARY: 198 mg/dL — AB (ref 65–99)
Glucose-Capillary: 162 mg/dL — ABNORMAL HIGH (ref 65–99)

## 2017-11-22 LAB — LIPID PANEL
CHOLESTEROL: 92 mg/dL (ref 0–200)
HDL: 25 mg/dL — ABNORMAL LOW (ref >40)
LDL Cholesterol: 35 mg/dL (ref 0–99)
Total CHOL/HDL Ratio: 3.7 RATIO
Triglycerides: 159 mg/dL — ABNORMAL HIGH (ref <150)
VLDL: 32 mg/dL (ref 0–40)

## 2017-11-22 LAB — TYPE AND SCREEN
ABO/RH(D): O POS
ANTIBODY SCREEN: NEGATIVE
UNIT DIVISION: 0

## 2017-11-22 LAB — HEMOGLOBIN A1C
Hgb A1c MFr Bld: 7.4 % — ABNORMAL HIGH (ref 4.8–5.6)
MEAN PLASMA GLUCOSE: 165.68 mg/dL

## 2017-11-22 LAB — CULTURE, BLOOD (ROUTINE X 2): Special Requests: ADEQUATE

## 2017-11-22 LAB — CBC
HCT: 29.9 % — ABNORMAL LOW (ref 36.0–46.0)
HEMOGLOBIN: 8.8 g/dL — AB (ref 12.0–15.0)
MCH: 20.4 pg — AB (ref 26.0–34.0)
MCHC: 29.4 g/dL — AB (ref 30.0–36.0)
MCV: 69.4 fL — AB (ref 78.0–100.0)
Platelets: 82 10*3/uL — ABNORMAL LOW (ref 150–400)
RBC: 4.31 MIL/uL (ref 3.87–5.11)
RDW: 21.6 % — AB (ref 11.5–15.5)
WBC: 8.3 10*3/uL (ref 4.0–10.5)

## 2017-11-22 LAB — BPAM RBC
BLOOD PRODUCT EXPIRATION DATE: 201905212359
UNIT TYPE AND RH: 9500

## 2017-11-22 MED ORDER — IOPAMIDOL (ISOVUE-370) INJECTION 76%
INTRAVENOUS | Status: AC
  Filled 2017-11-22: qty 50

## 2017-11-22 MED ORDER — ASPIRIN 325 MG PO TABS
325.0000 mg | ORAL_TABLET | Freq: Every day | ORAL | Status: DC
  Administered 2017-11-24 – 2017-11-25 (×2): 325 mg via ORAL
  Filled 2017-11-22 (×4): qty 1

## 2017-11-22 MED ORDER — IOPAMIDOL (ISOVUE-370) INJECTION 76%
50.0000 mL | Freq: Once | INTRAVENOUS | Status: AC
  Administered 2017-11-22: 50 mL via INTRAVENOUS

## 2017-11-22 MED ORDER — PANTOPRAZOLE SODIUM 40 MG PO TBEC
40.0000 mg | DELAYED_RELEASE_TABLET | Freq: Every day | ORAL | Status: DC
  Administered 2017-11-22 – 2017-11-24 (×3): 40 mg via ORAL
  Filled 2017-11-22 (×3): qty 1

## 2017-11-22 MED ORDER — ASPIRIN 300 MG RE SUPP
300.0000 mg | Freq: Every day | RECTAL | Status: DC
  Administered 2017-11-22 – 2017-11-23 (×2): 300 mg via RECTAL
  Filled 2017-11-22 (×4): qty 1

## 2017-11-22 MED ORDER — LORAZEPAM 2 MG/ML IJ SOLN
0.5000 mg | Freq: Four times a day (QID) | INTRAMUSCULAR | Status: DC | PRN
  Administered 2017-11-22 – 2017-11-23 (×2): 0.5 mg via INTRAVENOUS
  Filled 2017-11-22 (×2): qty 1

## 2017-11-22 MED ORDER — INSULIN GLARGINE 100 UNIT/ML ~~LOC~~ SOLN
20.0000 [IU] | Freq: Every day | SUBCUTANEOUS | Status: DC
  Administered 2017-11-22 – 2017-11-24 (×3): 20 [IU] via SUBCUTANEOUS
  Filled 2017-11-22 (×5): qty 0.2

## 2017-11-22 MED ORDER — SODIUM CHLORIDE 0.9 % IV SOLN: INTRAVENOUS | Status: DC

## 2017-11-22 MED ORDER — POTASSIUM CHLORIDE 10 MEQ/100ML IV SOLN
10.0000 meq | INTRAVENOUS | Status: AC
  Administered 2017-11-22 (×2): 10 meq via INTRAVENOUS
  Filled 2017-11-22 (×2): qty 100

## 2017-11-22 MED ORDER — DEXTROSE 5 % IV SOLN
INTRAVENOUS | Status: AC
  Administered 2017-11-22 – 2017-11-23 (×2): via INTRAVENOUS

## 2017-11-22 MED ORDER — WHITE PETROLATUM EX OINT
TOPICAL_OINTMENT | CUTANEOUS | Status: AC
  Filled 2017-11-22: qty 28.35

## 2017-11-22 MED ORDER — HEPARIN (PORCINE) IN NACL 100-0.45 UNIT/ML-% IJ SOLN
1500.0000 [IU]/h | INTRAMUSCULAR | Status: DC
  Administered 2017-11-22: 1100 [IU]/h via INTRAVENOUS
  Filled 2017-11-22 (×2): qty 250

## 2017-11-22 MED ORDER — POTASSIUM CHLORIDE 10 MEQ/100ML IV SOLN
10.0000 meq | INTRAVENOUS | Status: AC
  Administered 2017-11-22 (×4): 10 meq via INTRAVENOUS
  Filled 2017-11-22 (×4): qty 100

## 2017-11-22 NOTE — Progress Notes (Signed)
SLP Cancellation Note  Patient Details Name: [name] MRN: [id number] DOB: 07-04-1951   Cancelled treatment:       Reason Eval/Treat Not Completed: Fatigue/lethargy limiting ability to participate. Per RN pt received sedating medications for imaging this morning. She recommends returning later today. SLP will follow up as able.  [place] Circe Chilton SLP Student Clinician  [place] Mario Coronado 11/22/2017, 9:47 AM

## 2017-11-22 NOTE — Progress Notes (Addendum)
PULMONARY  / CRITICAL CARE MEDICINE  Name: [name] MRN: [id number] DOB: [birth year]    LOS: 93  REFERRING MD :  EDP Lita Mains  CHIEF COMPLAINT:  DKA w/ respiratory arrest  BRIEF PATIENT DESCRIPTION: 67 y/o woman with Hx of IDDM, hypothyroidism, G1DD (LVEF 65-70% 09/08/17), who recently underwent lumbar spine surgery on 4/12 taken to [facility] emergently for respiratory arrest en route, found to be in DKA with AG 29, BS > 700, pH 6.8 pCO2 below detectable range. She was transferred to [facility] ICU on mechanical ventilation. Levophed was started for initial hypotension, treatment for DKA, and empiric antibiotics. Her family reports some concern about missed ot extra medications due to polypharmacy (19 pills, insulin injections).  LINES / TUBES: LIJ CVC 4/13>>> Foley catheter 4/16>>>  CULTURES: 4/13 Blood Cx: Staph epi 1 out of 2 4/14 Resp Cx: Klebsiella pneumoniae  ANTIBIOTICS: Cefepime 4/13-4/14 Ancef 4/16>>  SIGNIFICANT EVENTS:  4/13 Admitted intubated for respiratory arrest 4/15 Extubated  INTERVAL HISTORY:  She remains encephalopathic but clinically very stable. Neuroimaging yesterday demonstrated multiple small posterior frontal lobe infarcts. Neurology consulted and full stroke workup is underway.  VITAL SIGNS: Temp:  [98.1 F (36.7 C)-98.9 F (37.2 C)] 98.3 F (36.8 C) (04/17 0823) Pulse Rate:  [79-93] 81 (04/17 0600) BP: (99-158)/(24-92) 142/60 (04/17 0600) SpO2:  [94 %-100 %] 100 % (04/17 0600) Weight:  [204 lb 5.9 oz (92.7 kg)] 204 lb 5.9 oz (92.7 kg) (04/17 0500) HEMODYNAMICS:   VENTILATOR SETTINGS:   INTAKE / OUTPUT: Intake/Output      04/16 0701 - 04/17 0700 04/17 0701 - 04/18 0700   I.V. (mL/kg) 3851.7 (41.5)    Other     NG/GT     IV Piggyback 850    Total Intake(mL/kg) 4701.7 (50.7)    Urine (mL/kg/hr) 2345 (1.1)    Stool     Total Output 2345    Net +2356.7          PHYSICAL EXAMINATION: General: Lying with eyes closed except when opening to  instructions Neuro: Attends to voice and answers questions appropriately, oriented to person and place, moving all extremities with good strength, but calls out intermittently HEENT: PERRL, no JVD Cardiovascular: RRR, no murmurs Lungs: CTAB, slightly tachypneic Abdomen: Soft, bowel sounds present Musculoskeletal: No peripheral edema, 2+ peripheral pulses Skin: No rashes, foam sacral pad and honeycomb dressing in place on back  LABS: Cbc Recent Labs  Lab 11/20/17 0429 11/21/17 0515 11/22/17 0650  WBC 6.3 7.4 8.3  HGB 7.8* 8.6* 8.8*  HCT 26.0* 29.4* 29.9*  PLT 146* 102* 82*    Chemistry  Recent Labs  Lab 11/18/17 1528 11/18/17 2105  11/19/17 1019  11/20/17 0429 11/21/17 0515 11/21/17 1709  NA 133* 136   < > 146*   < > 151* 155* 146*  K 6.9* 5.3*   < > 4.0   < > 4.1 2.9* 3.2*  CL 100* 106   < > 115*   < > 120* 119* 112*  CO2 4* <7*   < > 20*   < > 24 27 26   BUN 54* 57*   < > 43*   < > 32* 18 13  CREATININE 2.99* 3.62*   < > 2.00*   < > 1.56* 0.84 0.77  CALCIUM 8.3* 7.9*   < > 7.9*   < > 8.0* 8.6* 8.3*  MG 3.1* 2.6*  --   --   --   --   --   --  PHOS  --  7.3*  --  2.6  --   --   --   --   GLUCOSE 741* 605*   < > 155*   < > 267* 85 134*   < > = values in this interval not displayed.    Liver fxn Recent Labs  Lab 11/18/17 1528 11/18/17 2105 11/21/17 0515  AST 24 47* 26  ALT 18 28 30   ALKPHOS 104 109 80  BILITOT 1.9* 1.8* 0.7  PROT 6.0* 5.6* 5.3*  ALBUMIN 3.0* 2.9* 2.5*   coags Recent Labs  Lab 11/18/17 1528  APTT 31  INR 1.57   Sepsis markers Recent Labs  Lab 11/18/17 2032 11/18/17 2105 11/19/17 0046  LATICACIDVEN 1.5  --  1.5  PROCALCITON  --  4.08  --    Cardiac markers Recent Labs  Lab 11/19/17 0417 11/19/17 1019 11/19/17 1617  TROPONINI 3.10* 3.20* 2.41*   BNP No results for input(s): PROBNP in the last 168 hours. ABG Recent Labs  Lab 11/18/17 1612 11/18/17 2251 11/19/17 0435  PHART 6.811* 7.041* 7.364  PCO2ART CRITICAL RESULT  CALLED TO, READ BACK BY AND VERIFIED WITH: 23.3* 38.3  PO2ART 116.0* 347.0* 200*  HCO3  --  6.3* 21.0  TCO2  --  7*  --     CBG trend Recent Labs  Lab 11/21/17 1228 11/21/17 1703 11/21/17 1926 11/22/17 0022 11/22/17 0341  GLUCAP 151* 109* 150* 187* 162*    IMAGING: 4/13 CXR: ETT in place, low lung volumes, perihilar atelectasis, no consolidation or obvious edema 4/13 Abd: OG tube terminating in gastric antrum 4/13 CT Head: no acute abnormality 4/16 CT head: New small hypodensity posterosuperior right frontal lobe possibly small acute infarct. 4/16 MRI Brain: Small acute bilateral posterior frontal lobe infarcts.  ECG: 4/14: Sinus tach, QTc 428, no new ST elevation or T wave inversion  DIAGNOSES: Active Problems:   DKA (diabetic ketoacidoses) (HCC)   Diabetic ketoacidosis (HCC)   Acute respiratory failure with hypoxia ([place])   Acute renal failure (HCC)   Endotracheal tube present   Septic shock (HCC)   Cerebral thrombosis with cerebral infarction  ASSESSMENT / PLAN:  PULMONARY A: Maintaining well on minimal [place] RR remains elevated with normal WOB No CXR infiltrate Resp culture now also positive for klebsiella P:   Maintains on [place] Caution with sedation for delirium  CARDIOVASCULAR A: BP elevated now off vent, with agitation, off home meds Troponin peaked at 3.2 likely demand ischemia EKG unchanged besides sinus tach No Afib on telemetry review P:  Holding ARB, amlodipine Permissive HTN for acute stroke Can d/c LIJ ASA PO or PR for stroke Atorvastatin once PO access Continue cardiac monitoring  RENAL A: AKI back to baseline Good UOP Hypernatremia, hypokalemia treated Foley catheter in for AUR P: Replacing D5W and KCl per Bmet Decrease fluids to maintenance rate, until passing swallow eval for PO fluids  GASTROINTESTINAL A:  Abdominal pain on exam less today Normal appearing stools LFTs, lipase wnl P:    Protonix Observation  HEMATOLOGIC A: Hgb 7.8 Acute on chronic anemia s/p 2 units pRBCs - appropriate correction Leukocytosis improved Thrombocytosis now with thrombocytopenia 708->82 over 4 days, time course less consistent with HIT given biggest drop in first day P:  ASA for CVA VTE ppx If plts trend down further needs smear  INFECTIOUS A: Leukocytosis improved Temp improved Tracheal aspirate positive for klebsiella pneumoniae No focal infiltrate seen on CXR but incidental UL opacity on CT, is coughing but also  s/p ETT P:   Ancef for Cx+ with AMS 5 days max or maybe less if mentation, cough improving  ENDOCRINE A: CBGs at goal yesterday on D5W, insulin Hgb A1c on 4/11 8.0% P:   Continue 40U basal insulin q4hrs CBGs SSI-resistant Back on IVF until clears SLP eval  NEUROLOGIC A:  Initial encephalopathy from metabolic derangements, now found to have scattered small posterior frontal infarcts Per neuro question embolic versus watershed P:   CTA head and neck Added low dose benzos (taking max or more temazepam PTA) Consider new TTE- recent in February without an obvious embolic source  Following up Neurology service recommendations   Pulmonary and [place] Pager: [phone]  11/22/2017, 9:04 AM

## 2017-11-22 NOTE — Procedures (Signed)
ELECTROENCEPHALOGRAM REPORT  Date of Study: 11/22/2017  Patient's Name: [name] MRN: [id number] Date of Birth: 1950/11/10  Referring Provider: Dr. Antony Contras  Clinical History: This is a 67 year old woman with altered mental status.  Medications: Ativan, Fentanyl listed  Technical Summary: A multichannel digital EEG recording measured by the international 10-20 system with electrodes applied with paste and impedances below 5000 ohms performed as portable with EKG monitoring in an predominantly drowsy and asleep patient.  Hyperventilation and photic stimulation were not performed.  The digital EEG was referentially recorded, reformatted, and digitally filtered in a variety of bipolar and referential montages for optimal display.   Description: The patient is predominantly drowsy and asleep during the recording.  During brief period of wakefulness, there is a symmetric, medium voltage 7.5 Hz posterior dominant rhythm that poorly attenuates with eye opening and eye closure. This is admixed with a small amount of diffuse 4-5 Hz theta slowing of the waking background.  During drowsiness and sleep, there is an increase in theta and delta slowing of the background, at times sharply contoured over the left temporal region without clear epileptogenic potential.  Poorly formed vertex waves and sleep spindles were seen.  Hyperventilation and photic stimulation were not performed.  There were no epileptiform discharges or electrographic seizures seen.    EKG lead was unremarkable.  Impression: This predominantly drowsy and asleep EEG is abnormal due to mild diffuse background slowing.  Clinical Correlation of the above findings indicates diffuse cerebral dysfunction that is non-specific in etiology and can be seen with hypoxic/ischemic injury, toxic/metabolic encephalopathies, neurodegenerative disorders, or medication effect.  The absence of epileptiform discharges does not rule out a clinical  diagnosis of epilepsy.  Clinical correlation is advised.   Ellouise Newer, M.D.

## 2017-11-22 NOTE — Progress Notes (Signed)
ANTICOAGULATION CONSULT NOTE - Initial Consult  Pharmacy Consult for Heparin Indication: DVT  Allergies  Allergen Reactions  . Codeine Nausea And Vomiting  . Ambien [Zolpidem Tartrate] Other (See Comments)    Up sleep walking and eating  . Crestor [Rosuvastatin] Other (See Comments)    Muscle weakness, cramps and aching all over body  . Lipitor [Atorvastatin] Itching and Other (See Comments)    Muscle weakness, cramps and aches all over body  . Morphine And Related Other (See Comments)    Flushing and feeling hot Tolerates with Diphenhydramine  . Penicillins Rash and Other (See Comments)    Caused Headaches also Has patient had a PCN reaction causing immediate rash, facial/tongue/throat swelling, SOB or lightheadedness with hypotension: No Has patient had a PCN reaction causing severe rash involving mucus membranes or skin necrosis: No Has patient had a PCN reaction that required hospitalization No Has patient had a PCN reaction occurring within the last 10 years: No If all of the above answers are "NO", then may proceed with Cephalosporin use.    . Statins Other (See Comments)    Muscle weakness, cramps and aching all over body  . Dilaudid [Hydromorphone Hcl] Other (See Comments)    Hallucinations/ argumentative, goes beserk  . Amitriptyline Other (See Comments)    Loopy feeling  . Ceftin [Cefuroxime Axetil] Other (See Comments)    Stomach pain   . Lovaza [Omega-3-Acid Ethyl Esters] Other (See Comments)    Stomach pain   . Lunesta [Eszopiclone] Other (See Comments)    Causes dizziness  . Lyrica [Pregabalin] Nausea Only  . Metformin And Related Nausea Only    Pt denies this intolerance    Patient Measurements: Height: 5\' 3"  (160 cm) Weight: 204 lb 5.9 oz (92.7 kg) IBW/kg (Calculated) : 52.4 Heparin Dosing Weight: 73 kg  Vital Signs: Temp: 97.8 F (36.6 C) (04/17 1554) Temp Source: Oral (04/17 1554) BP: 111/49 (04/17 1600) Pulse Rate: 79 (04/17  1600)  Labs: Recent Labs    11/20/17 0429 11/21/17 0515 11/21/17 1709 11/22/17 0650 11/22/17 0800 11/22/17 1439  HGB 7.8* 8.6*  --  8.8*  --   --   HCT 26.0* 29.4*  --  29.9*  --   --   PLT 146* 102*  --  82*  --   --   CREATININE 1.56* 0.84 0.77  --  0.74 0.94    Estimated Creatinine Clearance: 62.8 mL/min (by C-G formula based on SCr of 0.94 mg/dL).   Medical History: Past Medical History:  Diagnosis Date  . Anemia    iron deficiency  . Arthritis   . Blood transfusion   . Cervical spondylosis with myelopathy and radiculopathy   . Depression   . Diabetes mellitus    Type II  . Diastolic dysfunction   . Diverticulitis   . DJD (degenerative joint disease)   . DVT (deep venous thrombosis) (HCC)    times 2 lower leg  . Endometriosis   . Family history of adverse reaction to anesthesia    mom and dad PONV  . Fibromyalgia   . GERD (gastroesophageal reflux disease)    occ  . Hiatal hernia   . History of kidney stones   . Hyperlipidemia   . Hypertension   . Hypothyroidism   . Insomnia   . Mild aortic insufficiency    by echo 04/2013  . Osteoarthritis    back and knee  . Ovarian cyst   . PONV (postoperative nausea and vomiting)   .  RLS (restless legs syndrome)   . Thoracic compression fracture ([place])   . Uterine fibroid   . UTI (lower urinary tract infection)   . Villous adenoma of right colon 04/08/2016  . Vitamin D deficiency disease     Assessment: 67 year old female to begin heparin for DVT, recent ischemic CVA Last dose of sq heparin at 2 pm  Goal of Therapy:  Heparin level 0.3-0.7 units/ml Monitor platelets by anticoagulation protocol: Yes   Plan:  Heparin at 1100 units / hr 8 hour heparin level Daily heparin level, CBC  Thank you  Anette Guarneri, PharmD [phone] 11/22/2017,4:21 PM

## 2017-11-22 NOTE — Progress Notes (Addendum)
[name] - DAILY PROGRESS NOTE    HISTORY [name] is an 67 y.o. female Hx of IDDM, hypothyroidism, G1DD (LVEF 65-70% 09/08/17), who recently underwent lumbar spine surgery on 4/12 taken to [facility] emergently for respiratory arrest en route, found to be in DKA with AG 29, BS > 700, pH 6.8 pCO2 below detectable range. She was transferred to [facility] ICU on mechanical ventilation, and started on pressors.  Empiric antibiotics and treatment for DKA.Marland Kitchen  Patient was extubated on 11/20/2017.  On admission CT of the head did not reveal any acute abnormalities.  Since extubation patient continued to have persistent encephalopathy versus agitated delirium which was thought to have unclear etiology of symptoms metabolic derangements have since improved and she is under treatment.  Repeat CT of the head was done on 11/21/2017 which revealed  New small hypodensity posterosuperior right frontal lobe possibly small acute infarct.  Neurology was consulted for further evaluation.  SUBJECTIVE Patient in bed very somnolent, was given 50 mg IV fentanyl for several hours prior for MRI.  Patient minimally cooperation due to somnolent, but she denies chest pain, shortness of breath,  headache or dizziness.  OBJECTIVE Most recent Vital Signs: Vitals:   11/22/17 0400 11/22/17 0500 11/22/17 0600 11/22/17 0823  BP: 126/64 125/71 (!) 142/60   Pulse: 79 82 81   Resp:      Temp:    98.3 F (36.8 C)  TempSrc:    Oral  SpO2: 94% 100% 100%   Weight:  92.7 kg (204 lb 5.9 oz)    Height:       CBG (last 3)  Recent Labs    11/22/17 0022 11/22/17 0341 11/22/17 1206  GLUCAP 187* 162* 117*    Physical Exam  HEENT-  Normocephalic, no lesions, without obvious abnormality.  Normal external eye and conjunctiva.  Dry mucous membranes  Cardiovascular-S1-S2 audible, pulses palpable throughout   Lungs- normal breath sounds, diminished in the bases, no excessive  working breathing.  Saturations within normal limits Abdomen-non tender to palpation with normal bowel sounds  Musculoskeletal-no joint tenderness, deformity or swelling, both hands are in Mitts Skin-warm and dry,  Neuro:  Mental Status: Somnolent but easily arousable although returns back to sleep.  But with prodding she is able to moderately concentrate and is  alert to self, place, date month and year.  Able to spell WORLD, decreased pariticipation continues to be somnolent when asked to spell backwards.  No dysarthria noted at this time,  Cranial Nerves: II: Blinks to threat, unable to accurately assess visual fields this patient with decreased participation III,IV, VI: ptosis not present,  pupils equal, round, reactive to light  V,VII: smile symmetric, facial light touch sensation normal bilaterally, grimaces with harsh stimuli VIII: Hearing intact to voice IX,X: uvula rises symmetrically XI: Decreased participation, patient did not attempt XII: midline tongue extension Motor: Right :  Upper extremity   5/5                                      Left:     Upper extremity   5/5  Lower extremity   5/5                                                  Lower extremity   5/5 Tone and bulk:normal tone throughout; no atrophy noted Sensory: Pinprick and light touch intact throughout, bilaterally Deep Tendon Reflexes: Patient with 2-3 beats of clonus in the left leg , +3 and brisk on  left patella with cross adduction on the left,  +2 on the right as well as +2 on brachioradialis and biceps bilaterally Plantars: Right: downgoing                                Left: downgoing Cerebellar: Unable to accurately assess due to patient altered mental status and diminshed participation.  Gait: Deferred  IV Fluid Intake:   . sodium chloride 10 mL/hr at 11/22/17 0600  .  ceFAZolin (ANCEF) IV Stopped (11/22/17 1610)    MEDICATIONS  . aspirin  325 mg Oral Daily   Or  . aspirin  300 mg  Rectal Daily  . chlorhexidine gluconate (MEDLINE KIT)  15 mL Mouth Rinse BID  . heparin  5,000 Units Subcutaneous Q8H  . insulin aspart  0-20 Units Subcutaneous Q4H  . insulin glargine  40 Units Subcutaneous Daily  . iopamidol      . levothyroxine  75 mcg Intravenous Daily  . LORazepam  0.5 mg Intravenous Q6H  . mouth rinse  15 mL Mouth Rinse QID  . pantoprazole  40 mg Oral QHS   PRN:  sodium chloride, acetaminophen, dextrose, fentaNYL (SUBLIMAZE) injection  Diet:  Diet NPO time specified   CLINICALLY SIGNIFICANT STUDIES Basic Metabolic Panel:  Recent Labs  Lab 11/18/17 1528 11/18/17 2105  11/19/17 1019  11/21/17 1709 11/22/17 0800  NA 133* 136   < > 146*   < > 146* 139  K 6.9* 5.3*   < > 4.0   < > 3.2* 5.8*  CL 100* 106   < > 115*   < > 112* 108  CO2 4* <7*   < > 20*   < > 26 24  GLUCOSE 741* 605*   < > 155*   < > 134* 178*  BUN 54* 57*   < > 43*   < > 13 12  CREATININE 2.99* 3.62*   < > 2.00*   < > 0.77 0.74  CALCIUM 8.3* 7.9*   < > 7.9*   < > 8.3* 7.8*  MG 3.1* 2.6*  --   --   --   --   --   PHOS  --  7.3*  --  2.6  --   --   --    < > = values in this interval not displayed.   Liver Function Tests:  Recent Labs  Lab 11/18/17 2105 11/21/17 0515  AST 47* 26  ALT 28 30  ALKPHOS 109 80  BILITOT 1.8* 0.7  PROT 5.6* 5.3*  ALBUMIN 2.9* 2.5*   CBC:  Recent Labs  Lab 11/18/17 1528 11/18/17 2105  11/21/17 0515 11/22/17 0650  WBC 29.3* 36.1*   < > 7.4 8.3  NEUTROABS 24.6* 33.2*  --   --   --   HGB 6.8* 6.8*   < > 8.6* 8.8*  HCT 27.2* 26.6*   < >  29.4* 29.9*  MCV 73.5* 69.5*   < > 68.7* 69.4*  PLT 708* 572*   < > 102* 82*   < > = values in this interval not displayed.   Coagulation:  Recent Labs  Lab 11/18/17 1528  LABPROT 18.6*  INR 1.57   Cardiac Enzymes:  Recent Labs  Lab 11/19/17 0417 11/19/17 1019 11/19/17 1617  TROPONINI 3.10* 3.20* 2.41*   Urinalysis:  Recent Labs  Lab 11/18/17 1520  COLORURINE YELLOW  LABSPEC 1.018  PHURINE 5.0    GLUCOSEU >=500*  HGBUR SMALL*  BILIRUBINUR NEGATIVE  KETONESUR 80*  PROTEINUR NEGATIVE  NITRITE NEGATIVE  LEUKOCYTESUR NEGATIVE   Lipid Panel    Component Value Date/Time   CHOL 92 11/22/2017 0800   TRIG 159 (H) 11/22/2017 0800   HDL 25 (L) 11/22/2017 0800   CHOLHDL 3.7 11/22/2017 0800   VLDL 32 11/22/2017 0800   LDLCALC 35 11/22/2017 0800   HgbA1C  Lab Results  Component Value Date   HGBA1C 8.0 (H) 11/16/2017    Urine Drug Screen:      Component Value Date/Time   LABOPIA POSITIVE (A) 11/18/2017 1520   COCAINSCRNUR NONE DETECTED 11/18/2017 1520   LABBENZ POSITIVE (A) 11/18/2017 1520   AMPHETMU NONE DETECTED 11/18/2017 1520   THCU NONE DETECTED 11/18/2017 1520   LABBARB NONE DETECTED 11/18/2017 1520    Alcohol Level:  Recent Labs  Lab 11/18/17 1609  ETH <10    Ct Angio Head/ Neck 11/22/17 IMPRESSION:  No intracranial or extracranial stenosis or occlusion. BILATERAL cortical hypodensities are developing, without associated abnormal intracranial enhancement. In the appropriate clinical setting, these could represent watershed infarcts.  Addendum Additional note made of RIGHT upper lobe opacity, possible pneumonia. Layering fluid RIGHT maxillary sinus suggesting acuity.  Ct Head Wo Contrast 11/21/2017 IMPRESSION:  New small hypodensity posterosuperior right frontal lobe possibly small acute infarct. No intracranial hemorrhage. Mild atrophy. Exophthalmos. Pansinus mucosal thickening/partial opacification. Partial opacification mastoid air cells bilaterally. No obstructing lesion of eustachian tube noted.  Mr Brain Wo Contrast 11/21/2017 IMPRESSION:  Small acute bilateral posterior frontal lobe infarcts.   EKG   normal sinus rhythm  Outstanding Stroke Work-up Studies:     Transcranial Doppler bubble study Venous Doppler bilateral lower extremity EEG  ASSESSMENT  67 y.o. female with Hx of IDDM, hypothyroidism, G1DD (LVEF 65-70% 09/08/17), who recently underwent  lumbar spine surgery on 4/12 taken to [facility] emergently for respiratory arrest en route, found to be in DKA with AG 29, BS > 700, pH 6.8 pCO2 below detectable range. She was transferred to [facility] ICU on mechanical ventilation. Levophed was started for initial hypotension, treatment for DKA, and empiric antibiotics.  Since extubation patient continued to have persistent encephalopathy versus agitated delirium which was thought to have unclear etiology of symptoms metabolic derangements have since improved and she is under treatment. CT head done on 4/16 showed Small hypodensity seen on the [facility] from today, new from 4/13. Neuro consulted  1.  Ischemic stroke - bilateral posterior frontal lobe  infarcts -rule out watershed stroke versus embolic source.  Patient currently on aspirin and unable to tolerate statin due to allergy.  Patient has had full stroke workup, currently pending transcranial  bubble study for PFO as well venous Dopplers to rule out DVT in bilateral lower extremities as patient recently had prolonged immobility with spine surgery on 4/12.  Continue early rehabilitation with physical therapy, occupational therapy and speech therapy  2. Metabolic/Toxic encephalopathy.  Patient presented with altered mental status  as well as multiple medical comorbidities, possible benzo withdrawal, ICU delirium, DKA, acute respiratory failure s/p intubation, acute renal insufficiency. Infectious work-up patient is work-up unremarkable.    Patient's persistent encephalopathy is not congruent with location of the stroke and and actually should be improvement secondary to aggressive metabolic treatment.  Will order EEG to rule out possible seizure.  Also, patient may have possible underlying baseline congnitive impairment  3.  Possible Benzo withdrawal-patient has been on Restoril long-term and currently abruptly stopped recommend resuming low-dose benzo or resume temazepam. 4.  Acute respiratory failure patient was  intubated currently extubated 5.  Hypernatremia 6.  Acute anemia 7. DKA -resolving 8. Right upper lobe PNA - trach aspirate + Kleb PNA  Hospital day # 4  PLAN/RECOMMENDATIONS -  No statin, pt allergic to statin.may consider the new PC SK 9 inhibitor injections later as an outpatient - Continue telemetry monitoring -Allow for permissive hypertension for the first 24-48h - only treat PRN if SBP >220 mmHg. Blood pressures can be gradually normalized to SBP<140 upon discharge. -Echocardiogram  With bubble study pending -Bilateral lower extremity venous studies to rule out DVT pending -EEG pending -Transcranial Doppler bubble study pending -HgbA1c, fasting lipid panel -Frequent neuro checks -Prophylactic therapy-Antiplatelet med: Aspirin - dose 33m PO or 3077mPR -Risk factor modification -PT consult, OT consult, Speech consult - Continue  low dose Benzo, pt was on chronic Temazepam - Frequent neuro checks - Correct electrolyte imbalances and metabolic derangements monitor for improvement of mental status - Continue Ancef and treat infectious sources   SIGNED PeLetha CapeNP Neuro-hospitalist Team [phone]/17/2019, 12:43 PM   11/22/2017 ATTENDING ASSESSMENT   I have personally examined this patient, reviewed notes, independently viewed imaging studies, participated in medical decision making and plan of care.ROS completed by me personally and pertinent positives fully documented  I have made any additions or clarifications directly to the above note. Agree with note above.  She presented with encephalopathy secondary to diabetic ketoacidosis, hypoxia, hypertension following recent back surgery and MRI shows bilateral small embolic infarcts in different vascular territories. Mechanism could be related to bone marrow fat embolism from the back surgery versus paradoxical embolism from DVT. If above tests are unremarkable may consider doing a TEE later. Continue supportive care.  Aspirin for now. Long discussion with Dr. ByLamonte Sakaind critical care team and answered questions. This patient is critically ill and at significant risk of neurological worsening, death and care requires constant monitoring of vital signs, hemodynamics,respiratory and cardiac monitoring, extensive review of multiple databases, frequent neurological assessment, discussion with family, other specialists and medical decision making of high complexity.I have made any additions or clarifications directly to the above note.This critical care time does not reflect procedure time, or teaching time or supervisory time of PA/NP/Med Resident etc but could involve care discussion time.  I spent 30 minutes of neurocritical care time  in the care of  this patient.      PrAntony ContrasMD Medical Director MoFalconerager: [phone]/17/2019 2:32 PM     To contact Stroke Continuity provider, please refer to [email or web address], contact General Neurology

## 2017-11-22 NOTE — Progress Notes (Signed)
EEG Completed; Results Pending  

## 2017-11-22 NOTE — Progress Notes (Signed)
LE venous duplex prelim: No DVT RLE. DVT noted in the left soleal vein. Landry Mellow, RDMS, RVT Gave results to RN.   Unable to complete TCD bubble study due to poor windows for Doppler.

## 2017-11-22 NOTE — Progress Notes (Signed)
PT Cancellation Note  Patient Details Name: [name] MRN: [id number] DOB: [birth year]   Cancelled Treatment:    Reason Eval/Treat Not Completed: Fatigue/lethargy limiting ability to participate. Attempted to arouse pt on two separate occasions, only opening eyes once to max stimulation. Will follow-up for PT evaluation tomorrow.  Mabeline Caras, PT, DPT Acute Rehab Services  Pager: [place] 11/22/2017, 11:23 AM

## 2017-11-22 NOTE — Progress Notes (Signed)
    [facility] has been requested to perform a transesophageal echocardiogram on [name] for stroke.  After careful review of history and examination, the risks and benefits of transesophageal echocardiogram have been explained including risks of esophageal damage, perforation (1:10,000 risk), bleeding, pharyngeal hematoma as well as other potential complications associated with conscious sedation including aspiration, arrhythmia, respiratory failure and death. Alternatives to treatment were discussed, questions were answered. Patient is willing to proceed.   I called and spoke with husband [name]. He is also in agreement with the procedure tomorrow. Pt is alert, oriented, and answers all questions during consent.  She is scheduled for TEE tomorrow 11/23/17 with Dr. Marlou Porch at 10:00AM. NPO at [place].  [place], [place]  11/22/2017 5:23 PM

## 2017-11-23 ENCOUNTER — Inpatient Hospital Stay (HOSPITAL_COMMUNITY): Payer: Medicare Other | Admitting: Anesthesiology

## 2017-11-23 ENCOUNTER — Encounter (HOSPITAL_COMMUNITY)
Admission: EM | Disposition: A | Discharge status: Home Health | Payer: Self-pay | Source: Home / Self Care | Attending: Critical Care Medicine

## 2017-11-23 ENCOUNTER — Inpatient Hospital Stay (HOSPITAL_COMMUNITY): Payer: Medicare Other

## 2017-11-23 ENCOUNTER — Encounter (HOSPITAL_COMMUNITY): Payer: Self-pay | Admitting: *Deleted

## 2017-11-23 DIAGNOSIS — I351 Nonrheumatic aortic (valve) insufficiency: Secondary | ICD-10-CM

## 2017-11-23 DIAGNOSIS — I340 Nonrheumatic mitral (valve) insufficiency: Secondary | ICD-10-CM

## 2017-11-23 HISTORY — PX: TEE WITHOUT CARDIOVERSION: SHX5443

## 2017-11-23 LAB — GLUCOSE, CAPILLARY
GLUCOSE-CAPILLARY: 238 mg/dL — AB (ref 65–99)
GLUCOSE-CAPILLARY: 124 mg/dL — AB (ref 65–99)
GLUCOSE-CAPILLARY: 136 mg/dL — AB (ref 65–99)
Glucose-Capillary: 176 mg/dL — ABNORMAL HIGH (ref 65–99)
Glucose-Capillary: 199 mg/dL — ABNORMAL HIGH (ref 65–99)
Glucose-Capillary: 205 mg/dL — ABNORMAL HIGH (ref 65–99)
Glucose-Capillary: 286 mg/dL — ABNORMAL HIGH (ref 65–99)

## 2017-11-23 LAB — CBC
HEMATOCRIT: 30.2 % — AB (ref 36.0–46.0)
Hemoglobin: 8.6 g/dL — ABNORMAL LOW (ref 12.0–15.0)
MCH: 19.8 pg — ABNORMAL LOW (ref 26.0–34.0)
MCHC: 28.5 g/dL — AB (ref 30.0–36.0)
MCV: 69.6 fL — AB (ref 78.0–100.0)
PLATELETS: 67 10*3/uL — AB (ref 150–400)
RBC: 4.34 MIL/uL (ref 3.87–5.11)
RDW: 21.7 % — AB (ref 11.5–15.5)
WBC: 6.9 10*3/uL (ref 4.0–10.5)

## 2017-11-23 LAB — BASIC METABOLIC PANEL
ANION GAP: 10 (ref 5–15)
BUN: 10 mg/dL (ref 6–20)
CALCIUM: 7.9 mg/dL — AB (ref 8.9–10.3)
CO2: 23 mmol/L (ref 22–32)
CREATININE: 0.91 mg/dL (ref 0.44–1.00)
Chloride: 105 mmol/L (ref 101–111)
GFR calc Af Amer: >60 mL/min (ref >60)
GFR calc non Af Amer: >60 mL/min (ref >60)
GLUCOSE: 222 mg/dL — AB (ref 65–99)
Potassium: 3.2 mmol/L — ABNORMAL LOW (ref 3.5–5.1)
Sodium: 138 mmol/L (ref 135–145)

## 2017-11-23 LAB — DIFFERENTIAL
Basophils Absolute: 0 10*3/uL (ref 0.0–0.1)
Basophils Relative: 0 %
EOS ABS: 0.2 10*3/uL (ref 0.0–0.7)
Eosinophils Relative: 2 %
LYMPHS ABS: 1.3 10*3/uL (ref 0.7–4.0)
LYMPHS PCT: 18 %
MONOS PCT: 9 %
Monocytes Absolute: 0.6 10*3/uL (ref 0.1–1.0)
Neutro Abs: 5.2 10*3/uL (ref 1.7–7.7)
Neutrophils Relative %: 71 %

## 2017-11-23 LAB — PATHOLOGIST SMEAR REVIEW

## 2017-11-23 LAB — PROTIME-INR
INR: 1.09
PROTHROMBIN TIME: 14 s (ref 11.4–15.2)

## 2017-11-23 LAB — SAVE SMEAR

## 2017-11-23 LAB — POTASSIUM: Potassium: 3.1 mmol/L — ABNORMAL LOW (ref 3.5–5.1)

## 2017-11-23 LAB — HEPARIN LEVEL (UNFRACTIONATED): HEPARIN UNFRACTIONATED: 0.1 [IU]/mL — AB (ref 0.30–0.70)

## 2017-11-23 SURGERY — ECHOCARDIOGRAM, TRANSESOPHAGEAL
Anesthesia: Monitor Anesthesia Care

## 2017-11-23 MED ORDER — PROMETHAZINE HCL 25 MG PO TABS
25.0000 mg | ORAL_TABLET | Freq: Once | ORAL | Status: AC
  Administered 2017-11-23: 25 mg via ORAL
  Filled 2017-11-23: qty 1

## 2017-11-23 MED ORDER — ACETAMINOPHEN 10 MG/ML IV SOLN
1000.0000 mg | Freq: Four times a day (QID) | INTRAVENOUS | Status: DC | PRN
  Administered 2017-11-23: 1000 mg via INTRAVENOUS
  Filled 2017-11-23: qty 100

## 2017-11-23 MED ORDER — DEXMEDETOMIDINE HCL 200 MCG/2ML IV SOLN
INTRAVENOUS | Status: DC | PRN
  Administered 2017-11-23 (×2): 8 ug via INTRAVENOUS

## 2017-11-23 MED ORDER — POTASSIUM CHLORIDE CRYS ER 20 MEQ PO TBCR
40.0000 meq | EXTENDED_RELEASE_TABLET | Freq: Two times a day (BID) | ORAL | Status: AC
  Administered 2017-11-23 (×2): 40 meq via ORAL
  Filled 2017-11-23 (×2): qty 2

## 2017-11-23 MED ORDER — RIVAROXABAN 20 MG PO TABS: 20.0000 mg | ORAL_TABLET | Freq: Every day | ORAL | Status: DC

## 2017-11-23 MED ORDER — SODIUM CHLORIDE 0.9% FLUSH: 3.0000 mL | INTRAVENOUS | Status: DC | PRN

## 2017-11-23 MED ORDER — PHENYLEPHRINE HCL 10 MG/ML IJ SOLN
INTRAMUSCULAR | Status: DC | PRN
  Administered 2017-11-23: 120 ug via INTRAVENOUS
  Administered 2017-11-23 (×2): 40 ug via INTRAVENOUS
  Administered 2017-11-23: 80 ug via INTRAVENOUS
  Administered 2017-11-23: 40 ug via INTRAVENOUS

## 2017-11-23 MED ORDER — LIDOCAINE HCL (CARDIAC) PF 100 MG/5ML IV SOSY
PREFILLED_SYRINGE | INTRAVENOUS | Status: DC | PRN
  Administered 2017-11-23: 100 mg via INTRATRACHEAL

## 2017-11-23 MED ORDER — AMLODIPINE BESYLATE 5 MG PO TABS
5.0000 mg | ORAL_TABLET | Freq: Every day | ORAL | Status: DC
  Administered 2017-11-23 – 2017-11-25 (×3): 5 mg via ORAL
  Filled 2017-11-23 (×4): qty 1

## 2017-11-23 MED ORDER — POTASSIUM CHLORIDE 10 MEQ/50ML IV SOLN
10.0000 meq | INTRAVENOUS | Status: AC
  Administered 2017-11-23 (×2): 10 meq via INTRAVENOUS
  Filled 2017-11-23 (×2): qty 50

## 2017-11-23 MED ORDER — EPHEDRINE SULFATE 50 MG/ML IJ SOLN
INTRAMUSCULAR | Status: DC | PRN
  Administered 2017-11-23 (×2): 5 mg via INTRAVENOUS

## 2017-11-23 MED ORDER — SODIUM CHLORIDE 0.9% FLUSH
3.0000 mL | Freq: Two times a day (BID) | INTRAVENOUS | Status: DC
  Administered 2017-11-23 – 2017-11-25 (×3): 3 mL via INTRAVENOUS

## 2017-11-23 MED ORDER — FUROSEMIDE 10 MG/ML IJ SOLN
40.0000 mg | Freq: Once | INTRAMUSCULAR | Status: AC
  Administered 2017-11-23: 40 mg via INTRAVENOUS
  Filled 2017-11-23: qty 4

## 2017-11-23 MED ORDER — PROPOFOL 500 MG/50ML IV EMUL
INTRAVENOUS | Status: DC | PRN
  Administered 2017-11-23: 100 ug/kg/min via INTRAVENOUS

## 2017-11-23 MED ORDER — RIVAROXABAN 15 MG PO TABS
15.0000 mg | ORAL_TABLET | Freq: Two times a day (BID) | ORAL | Status: DC
  Administered 2017-11-23 – 2017-11-25 (×5): 15 mg via ORAL
  Filled 2017-11-23 (×6): qty 1

## 2017-11-23 MED ORDER — SODIUM CHLORIDE 0.9 % IV SOLN: 250.0000 mL | INTRAVENOUS | Status: DC | PRN

## 2017-11-23 MED ORDER — PROPOFOL 10 MG/ML IV BOLUS
INTRAVENOUS | Status: DC | PRN
  Administered 2017-11-23 (×2): 20 mg via INTRAVENOUS

## 2017-11-23 NOTE — Transfer of Care (Signed)
Immediate Anesthesia Transfer of Care Note  Patient: [name]  Procedure(s) Performed: TRANSESOPHAGEAL ECHOCARDIOGRAM (TEE) (N/A )  Patient Location: Endoscopy Unit  Anesthesia Type:MAC  Level of Consciousness: awake, alert  and oriented  Airway & Oxygen Therapy: Patient Spontanous Breathing and Patient connected to nasal cannula oxygen  Post-op Assessment: Report given to RN, Post -op Vital signs reviewed and stable and Patient moving all extremities X 4  Post vital signs: Reviewed and stable  Last Vitals:  Vitals Value Taken Time  BP    Temp 36.8 C 11/23/2017  1:22 PM  Pulse    Resp 20 11/23/2017  1:22 PM  SpO2 98 % 11/23/2017  1:22 PM    Last Pain:  Vitals:   11/23/17 1322  TempSrc: Oral  PainSc: 0-No pain         Complications: No apparent anesthesia complications

## 2017-11-23 NOTE — Evaluation (Signed)
Physical Therapy Evaluation Patient Details Name: [name] MRN: [id number] DOB: Sep 30, 1950 Today's Date: 11/23/2017   History of Present Illness  Pt is a 67 y.o. female admitted to [place] on 4/13 with DKA and in respiratory arrest; transferred to [facility] on mechanical ventilation, placed on pressors. Extubated 4/16. MRI +B posterior frontal lobe infarcts. DVT noted in the L soleal vein 4/17. PMH includes back L2-4 PLIF on 4/11, B shoulder surgeries, B TKA, remote back sx, DM, hypothyroid, CHF.     Clinical Impression  Pt presents with an overall decrease in functional mobility secondary to above. PTA, pt indep and lives with family; recent d/c home after surgery for L2-4 PLIF on 4/11. Pt able to recall 2/3 back precautions at beginning of session. Session limited secondary to pt being transported for TEE. Able to stand and take steps with HHA and min guard for balance. Expect pt to progress quickly with mobility. Pt would benefit from continued acute PT services to maximize functional mobility and independence prior to d/c home.  Resting BP 142/53 Post-mobility BP 131/49 SpO2 99% on RA     Follow Up Recommendations No PT follow up;Supervision for mobility/OOB    Equipment Recommendations  None recommended by PT    Recommendations for Other Services       Precautions / Restrictions Precautions Precautions: Back;Fall Precaution Comments: pt able to state 2/3 back precautions at beginning of session Required Braces or Orthoses: Spinal Brace Spinal Brace: Lumbar corset;Applied in sitting position Restrictions Weight Bearing Restrictions: No      Mobility  Bed Mobility Overal bed mobility: Needs Assistance Bed Mobility: Rolling;Sidelying to Sit;Sit to Sidelying Rolling: Supervision Sidelying to sit: Min assist     Sit to sidelying: Min guard General bed mobility comments: pt utilizing log roll technique, assist to raise trunk  Transfers Overall transfer level: Needs  assistance Equipment used: 2 person hand held assist Transfers: Sit to/from Stand Sit to Stand: Min assist         General transfer comment: steadying assist, pt guarded  Ambulation/Gait Ambulation/Gait assistance: Min guard Ambulation Distance (Feet): 4 Feet Assistive device: 1 person hand held assist Gait Pattern/deviations: Step-to pattern Gait velocity: Decreased   General Gait Details: Able to take sides steps towards HOB with light HHA and min guard for balance; session limited by pt to be transported for TEE  Stairs            Wheelchair Mobility    Modified Rankin (Stroke Patients Only)       Balance Overall balance assessment: Needs assistance Sitting-balance support: No upper extremity supported;Feet supported Sitting balance-Leahy Scale: Fair     Standing balance support: Single extremity supported;No upper extremity supported;During functional activity Standing balance-Leahy Scale: Fair                               Pertinent Vitals/Pain Pain Assessment: No/denies pain    Home Living Family/patient expects to be discharged to:: Private residence Living Arrangements: Spouse/significant other Available Help at Discharge: Family;Available PRN/intermittently Type of Home: House Home Access: Stairs to enter Entrance Stairs-Rails: None Entrance Stairs-Number of Steps: 4 Home Layout: One level Home Equipment: Walker - 2 wheels;Cane - single point;Bedside commode;Shower seat - built in      Prior Function Level of Independence: Independent               Hand Dominance   Dominant Hand: Right    Extremity/Trunk Assessment  Upper Extremity Assessment Upper Extremity Assessment: Overall WFL for tasks assessed(h/o RC repair)    Lower Extremity Assessment Lower Extremity Assessment: Overall WFL for tasks assessed(h/o B TKA)    Cervical / Trunk Assessment Cervical / Trunk Assessment: Other exceptions Cervical / Trunk  Exceptions: Recent L2-4 PLIF (L2-4)  Communication   Communication: No difficulties  Cognition Arousal/Alertness: Awake/alert Behavior During Therapy: WFL for tasks assessed/performed Overall Cognitive Status: Within Functional Limits for tasks assessed                                        General Comments General comments (skin integrity, edema, etc.): Son present throughout session. SpO2 >90% on RA    Exercises     Assessment/Plan    PT Assessment Patient needs continued PT services  PT Problem List Decreased activity tolerance;Decreased balance;Decreased mobility;Decreased knowledge of use of DME       PT Treatment Interventions DME instruction;Gait training;Stair training;Functional mobility training;Therapeutic activities;Therapeutic exercise;Balance training;Patient/family education    PT Goals (Current goals can be found in the Care Plan section)  Acute Rehab PT Goals Patient Stated Goal: to get home and have a tea party with her granddaughter PT Goal Formulation: With patient Time For Goal Achievement: 12/07/17 Potential to Achieve Goals: Good    Frequency Min 3X/week   Barriers to discharge        Co-evaluation               AM-PAC PT "6 Clicks" Daily Activity  Outcome Measure Difficulty turning over in bed (including adjusting bedclothes, sheets and blankets)?: None Difficulty moving from lying on back to sitting on the side of the bed? : Unable Difficulty sitting down on and standing up from a chair with arms (e.g., wheelchair, bedside commode, etc,.)?: A Little Help needed moving to and from a bed to chair (including a wheelchair)?: A Little Help needed walking in hospital room?: A Little Help needed climbing 3-5 steps with a railing? : A Little 6 Click Score: 17    End of Session   Activity Tolerance: Patient tolerated treatment well Patient left: in bed;with call bell/phone within reach;Other (comment);with family/visitor  present;with nursing/sitter in room(for transport to TEE) Nurse Communication: Mobility status PT Visit Diagnosis: Other abnormalities of gait and mobility (R26.89);Unsteadiness on feet (R26.81)    Time: 0867-6195 PT Time Calculation (min) (ACUTE ONLY): 18 min   Charges:   PT Evaluation $PT Eval Moderate Complexity: 1 Mod     PT G Codes:       Mabeline Caras, PT, DPT Acute Rehab Services  Pager: [place] 11/23/2017, 1:57 PM

## 2017-11-23 NOTE — Progress Notes (Signed)
ANTICOAGULATION CONSULT NOTE - Initial Consult  Pharmacy Consult for Xarelto Indication: DVT; Suspected HIT  Allergies  Allergen Reactions  . Codeine Nausea And Vomiting  . Ambien [Zolpidem Tartrate] Other (See Comments)    Up sleep walking and eating  . Crestor [Rosuvastatin] Other (See Comments)    Muscle weakness, cramps and aching all over body  . Lipitor [Atorvastatin] Itching and Other (See Comments)    Muscle weakness, cramps and aches all over body  . Morphine And Related Other (See Comments)    Flushing and feeling hot Tolerates with Diphenhydramine  . Penicillins Rash and Other (See Comments)    Caused Headaches also Has patient had a PCN reaction causing immediate rash, facial/tongue/throat swelling, SOB or lightheadedness with hypotension: No Has patient had a PCN reaction causing severe rash involving mucus membranes or skin necrosis: No Has patient had a PCN reaction that required hospitalization No Has patient had a PCN reaction occurring within the last 10 years: No If all of the above answers are "NO", then may proceed with Cephalosporin use.    . Statins Other (See Comments)    Muscle weakness, cramps and aching all over body  . Dilaudid [Hydromorphone Hcl] Other (See Comments)    Hallucinations/ argumentative, goes beserk  . Amitriptyline Other (See Comments)    Loopy feeling  . Ceftin [Cefuroxime Axetil] Other (See Comments)    Stomach pain   . Lovaza [Omega-3-Acid Ethyl Esters] Other (See Comments)    Stomach pain   . Lunesta [Eszopiclone] Other (See Comments)    Causes dizziness  . Lyrica [Pregabalin] Nausea Only  . Metformin And Related Nausea Only    Pt denies this intolerance    Patient Measurements: Height: 5\' 3"  (160 cm) Weight: 204 lb 5.9 oz (92.7 kg) IBW/kg (Calculated) : 52.4  Vital Signs: Temp: 98.4 F (36.9 C) (04/18 0833) Temp Source: Oral (04/18 0833) BP: 132/56 (04/18 1100) Pulse Rate: 78 (04/18 1100)  Labs: Recent Labs   11/21/17 0515  11/22/17 0650 11/22/17 0800 11/22/17 1439 11/23/17 0258 11/23/17 1000  HGB 8.6*  --  8.8*  --   --  8.6*  --   HCT 29.4*  --  29.9*  --   --  30.2*  --   PLT 102*  --  82*  --   --  67*  --   LABPROT  --   --   --   --   --  14.0  --   INR  --   --   --   --   --  1.09  --   HEPARINUNFRC  --   --   --   --   --  <0.10* 0.10*  CREATININE 0.84   < >  --  0.74 0.94 0.91  --    < > = values in this interval not displayed.    Estimated Creatinine Clearance: 64.9 mL/min (by C-G formula based on SCr of 0.91 mg/dL).   Medical History: Past Medical History:  Diagnosis Date  . Anemia    iron deficiency  . Arthritis   . Blood transfusion   . Cervical spondylosis with myelopathy and radiculopathy   . Depression   . Diabetes mellitus    Type II  . Diastolic dysfunction   . Diverticulitis   . DJD (degenerative joint disease)   . DVT (deep venous thrombosis) (HCC)    times 2 lower leg  . Endometriosis   . Family history of adverse reaction to anesthesia  mom and dad PONV  . Fibromyalgia   . GERD (gastroesophageal reflux disease)    occ  . Hiatal hernia   . History of kidney stones   . Hyperlipidemia   . Hypertension   . Hypothyroidism   . Insomnia   . Mild aortic insufficiency    by echo 04/2013  . Osteoarthritis    back and knee  . Ovarian cyst   . PONV (postoperative nausea and vomiting)   . RLS (restless legs syndrome)   . Thoracic compression fracture ([place])   . Uterine fibroid   . UTI (lower urinary tract infection)   . Villous adenoma of right colon 04/08/2016  . Vitamin D deficiency disease    Assessment: 67 year old female with new DVT in LE and suspected HIT to stop IV heparin and change to Xarelto.   INR 1.09. Hgb 8.6, Plts 67.   Goal of Therapy:  Monitor platelets by anticoagulation protocol: Yes   Plan:  Discontinue IV Heparin.  Start Xarelto 15mg  po BID x21 days then 20mg  po daily with supper.  Monitor platelets and for signs and  symptoms of bleeding.  Follow-up HIT panel (will add Heparin allergy to chart until HIT returns)  Sloan Leiter, PharmD, BCPS, BCCCP Clinical Pharmacist Clinical phone 11/23/2017 until 3:30PM - [phone] After hours, please call #[phone] 11/23/2017,11:41 AM

## 2017-11-23 NOTE — Interval H&P Note (Signed)
History and Physical Interval Note:  11/23/2017 12:51 PM  [name]  has presented today for surgery, with the diagnosis of stroke  The various methods of treatment have been discussed with the patient and family. After consideration of risks, benefits and other options for treatment, the patient has consented to  Procedure(s): TRANSESOPHAGEAL ECHOCARDIOGRAM (TEE) (N/A) as a surgical intervention .  The patient's history has been reviewed, patient examined, no change in status, stable for surgery.  I have reviewed the patient's chart and labs.  Questions were answered to the patient's satisfaction.     [employer]

## 2017-11-23 NOTE — Evaluation (Signed)
Occupational Therapy Evaluation Patient Details Name: [name] MRN: [id number] DOB: [birth year] Today's Date: 11/23/2017    History of Present Illness Pt admitted to [facility] on 4/13 with DKA and in respiratory arrest. Transferred to [facility] on mechanical ventilation, placed on pressors. Extubated 4/16. MRI +B posterior frontal lobe infarcts. DVT noted in the L soleal vein 4/17. PMH: back sx on 4/11, B shoulder surgeries, B TKA, remote back sx, DM, hypothyroid, CHF.    Clinical Impression   Pt is typically independent. Presents with generalized weakness and impaired standing balance. She requires min to max assist for ADL and min assist for OOB mobility. Pt is able to state 2/3 back precautions. Anticipate pt will progress well to be able to return home with her supportive family. Will follow acutely.    Follow Up Recommendations  No OT follow up    Equipment Recommendations  None recommended by OT    Recommendations for Other Services       Precautions / Restrictions Precautions Precautions: Back Precaution Comments: pt able to state 2/3 back precautions Required Braces or Orthoses: Spinal Brace Spinal Brace: Applied in sitting position      Mobility Bed Mobility Overal bed mobility: Needs Assistance Bed Mobility: Rolling;Sidelying to Sit;Sit to Sidelying Rolling: Supervision Sidelying to sit: Min assist     Sit to sidelying: Min guard General bed mobility comments: pt utilizing log roll technique, assist to raise trunk  Transfers Overall transfer level: Needs assistance Equipment used: 2 person hand held assist Transfers: Sit to/from Stand Sit to Stand: Min assist         General transfer comment: steadying assist, pt guarded    Balance                                           ADL either performed or assessed with clinical judgement   ADL Overall ADL's : Needs assistance/impaired     Grooming: Set up;Sitting   Upper Body Bathing: Minimal  assistance;Sitting   Lower Body Bathing: Maximal assistance;Sit to/from stand   Upper Body Dressing : Minimal assistance;Sitting   Lower Body Dressing: Maximal assistance;Sit to/from stand   Toilet Transfer: Minimal assistance;Ambulation     Toileting - Clothing Manipulation Details (indicate cue type and reason): pt uses toilet tongs     Functional mobility during ADLs: Minimal assistance(hand held) General ADL Comments: Pt can typically cross her foot over her opposite knee, unable this date.     Vision Patient Visual Report: No change from baseline       Perception     Praxis      Pertinent Vitals/Pain Pain Assessment: No/denies pain     Hand Dominance Right   Extremity/Trunk Assessment Upper Extremity Assessment Upper Extremity Assessment: Overall WFL for tasks assessed(h/o RCR)   Lower Extremity Assessment Lower Extremity Assessment: Defer to PT evaluation   Cervical / Trunk Assessment Cervical / Trunk Assessment: Other exceptions(recent back sx)   Communication Communication Communication: No difficulties   Cognition Arousal/Alertness: Awake/alert Behavior During Therapy: WFL for tasks assessed/performed Overall Cognitive Status: Within Functional Limits for tasks assessed                                     General Comments       Exercises     Shoulder Instructions  Home Living Family/patient expects to be discharged to:: Private residence Living Arrangements: Spouse/significant other Available Help at Discharge: Family;Available PRN/intermittently Type of Home: House Home Access: Stairs to enter [employer] of Steps: 4 Entrance Stairs-Rails: None Home Layout: One level     Bathroom Shower/Tub: Occupational psychologist: Standard   How Accessible: Accessible via walker Home Equipment: Rule - 2 wheels;Cane - single point;Bedside commode;Shower seat - built in          Prior  Functioning/Environment Level of Independence: Independent                 OT Problem List: Impaired balance (sitting and/or standing);Decreased activity tolerance;Decreased knowledge of precautions;Decreased knowledge of use of DME or AE;Obesity      OT Treatment/Interventions: Self-care/ADL training;DME and/or AE instruction;Therapeutic activities;Patient/family education;Balance training    OT Goals(Current goals can be found in the care plan section) Acute Rehab OT Goals Patient Stated Goal: to have a tea party with her granddaughter OT Goal Formulation: With patient Time For Goal Achievement: 11/30/17 Potential to Achieve Goals: Good ADL Goals Pt Will Perform Grooming: with supervision;standing Pt Will Perform Lower Body Bathing: with supervision;sit to/from stand Pt Will Perform Lower Body Dressing: with supervision;sit to/from stand Pt Will Transfer to Toilet: with supervision;ambulating;bedside commode Pt Will Perform Toileting - Clothing Manipulation and hygiene: with supervision;with adaptive equipment;sit to/from stand(toilet tongs) Additional ADL Goal #1: Pt will generalize back precautions in ADL.  OT Frequency: Min 2X/week   Barriers to D/C:            Co-evaluation              AM-PAC PT "6 Clicks" Daily Activity     Outcome Measure Help from another person eating meals?: None Help from another person taking care of personal grooming?: A Little Help from another person toileting, which includes using toliet, bedpan, or urinal?: A Little Help from another person bathing (including washing, rinsing, drying)?: A Lot Help from another person to put on and taking off regular upper body clothing?: A Little Help from another person to put on and taking off regular lower body clothing?: A Lot 6 Click Score: 17   End of Session Equipment Utilized During Treatment: Back brace Nurse Communication: Mobility status  Activity Tolerance: Patient tolerated  treatment well Patient left: in bed;with call bell/phone within reach  OT Visit Diagnosis: Unsteadiness on feet (R26.81)                Time: 8250-5397 OT Time Calculation (min): 18 min Charges:  OT General Charges $OT Visit: 1 Visit OT Evaluation $OT Eval Moderate Complexity: 1 Mod G-Codes:     2017-12-21 Nestor Lewandowsky, OTR/L Pager: [phone]  Werner Lean Haze Boyden 2017-12-21, 1:18 PM

## 2017-11-23 NOTE — Progress Notes (Signed)
SLP Cancellation Note  Patient Details Name: JACQUELINNE SPEAK MRN: [id number] DOB: [birth year]   Cancelled treatment:       Reason Eval/Treat Not Completed: Other (comment) Pt NPO this morning pending TEE. Will f/u as able.    Germain Osgood 11/23/2017, 11:45 AM  Germain Osgood, M.A. CCC-SLP [phone]

## 2017-11-23 NOTE — Anesthesia Preprocedure Evaluation (Signed)
Anesthesia Evaluation  Patient identified by MRN, date of birth, ID band Patient awake    Reviewed: Allergy & Precautions, H&P , Patient's Chart, lab work & pertinent test results  Airway Mallampati: II  TM Distance: >3 FB Neck ROM: full    Dental  (+) Teeth Intact   Pulmonary    breath sounds clear to auscultation       Cardiovascular hypertension,  Rhythm:regular Rate:Normal     Neuro/Psych    GI/Hepatic   Endo/Other  diabetes  Renal/GU      Musculoskeletal   Abdominal   Peds  Hematology   Anesthesia Other Findings  Normal EF      Reproductive/Obstetrics (+) Pregnancy                             Anesthesia Physical Anesthesia Plan  ASA: III  Anesthesia Plan: MAC   Post-op Pain Management:    Induction: Intravenous  PONV Risk Score and Plan:   Airway Management Planned: Mask and Natural Airway  Additional Equipment:   Intra-op Plan:   Post-operative Plan:   Informed Consent:   Plan Discussed with:   Anesthesia Plan Comments:         Anesthesia Quick Evaluation

## 2017-11-23 NOTE — Care Management Note (Signed)
Case Management Note  Patient Details  Name: [name] MRN: [id number] Date of Birth: 1951/06/16  Subjective/Objective: Pt admitted in DKA                   Action/Plan:   PTA independent from home with husband.  Pt states she has PCP and denied barriers with obtaining/paying for medications as prescribed.  CM will continue to follow for discharge needs   Expected Discharge Date:                  Expected Discharge Plan:  Home/Self Care  In-House Referral:     Discharge planning Services  CM Consult  Post Acute Care Choice:    Choice offered to:     DME Arranged:    DME Agency:     HH Arranged:    HH Agency:     Status of Service:     If discussed at [employer] of Stay Meetings, dates discussed:    Additional Comments:  Maryclare Labrador, RN 11/23/2017, 12:04 PM

## 2017-11-23 NOTE — H&P (View-Only) (Signed)
PROGRESS NOTE  [name] VEH:[id number] DOB: [birth year] DOA: 11/18/2017 PCP: Harlan Stains, MD   LOS: 5 days   Brief Narrative / Interim history: 67 yo F with IDDM, hypothyroidism, G1DD (EF 65-70%), who recently had L spine surgery on 4/12, was taken emergently to the ER for DKA, metabolic acidosis and respiratory arrest en route, she was intubated and admitted to the ICU. She was started on pressors, empiric antibiotics, insulin.   LINES / TUBES: LIJ CVC 4/13>>> Foley catheter 4/16>>>  CULTURES: 4/13 Blood Cx: Staph epi 1 out of 2 4/14 Resp Cx: Klebsiella pneumoniae  ANTIBIOTICS: Cefepime 4/13-4/14 Ancef 4/16>>  SIGNIFICANT EVENTS:  4/13 Admitted intubated for respiratory arrest 4/15 Extubated 4/18 TRH took over   Assessment & Plan: Active Problems:   DKA (diabetic ketoacidoses) ([place])   Diabetic ketoacidosis ([place])   Acute respiratory failure with hypoxia (HCC)   Acute renal failure ([place])   Endotracheal tube present   Septic shock (HCC)   Cerebral thrombosis with cerebral infarction   Acute encephalopathy   Acute encephalopathy -Likely multifactorial in the setting of DKA, ICU stay -She is improving, still a little bit sleepy this morning but alert and oriented x4, appropriate -EEG done on 4/17, without epileptiform discharges  Acute CVA -MRI of the brain done on 4/16 showed small acute bilateral posterior frontal lobe infarcts, concern for watershed versus embolic -Neurology following, plan for a TEE today -A1c 7.4, lipid panel and LDL of 35 -CT angiogram without any significant intracranial or extracranial stenosis or occlusion  Acute hypoxic respiratory failure -In the setting of acute encephalopathy, patient extubated and on room air this morning  Hypertension -Permissive hypertension for acute stroke  Troponin elevation -No chest pain or ACS type symptoms, likely demand  Acute kidney injury -In the setting of hypotension/shock, now  resolved  Presumed pneumonia -With leukocytosis, CT scan did show upper lobe opacity, has been intubated and tracheal aspirate is positive for Klebsiella -Treating with Ancef for total of 5 days  Diabetes mellitus -Continue basal insulin and sliding scale  DVT -Ultrasound done on 4/17 showed DVT in the left soleal vein, started on heparin however no discontinue heparin as below and place on Xarelto  Thrombocytopenia -Platelets are progressively getting worse today, discussed with Dr. Benay Spice with oncology, although it does not fit very characteristically with HIT could not exclude at this point given the fact that she has been receiving subcutaneous heparin prior to being on heparin infusion -send HIT panel, change anticoagulation, obtain peripheral smear -Discontinue heparin, also discussed with Dr. Leonie Man, okay with Xarelto -Closely monitor for bleeding   DVT prophylaxis: Xarelto Code Status: Full code Family Communication: no family at bedside Disposition Plan: home when ready   Consultants:   Neurology   PCCM  Hematology - phone, Dr. Benay Spice  Procedures:   TEE -pending   Subjective: -Doing better this morning, no chest pain, shortness of breath, no abdominal pain, nausea or vomiting  Objective: Vitals:   11/23/17 0600 11/23/17 0700 11/23/17 0800 11/23/17 0833  BP: (!) 122/44 (!) 121/53 (!) 129/53   Pulse: 74 77 80   Resp:      Temp:    98.4 F (36.9 C)  TempSrc:    Oral  SpO2: 100% 99% 100%   Weight:      Height:        Intake/Output Summary (Last 24 hours) at 11/23/2017 1033 Last data filed at 11/23/2017 0703 Gross per 24 hour  Intake 866.25 ml  Output 1475 ml  Net -608.75 ml   Filed Weights   11/20/17 0420 11/21/17 0500 11/22/17 0500  Weight: 97.2 kg (214 lb 4.6 oz) 94 kg (207 lb 3.7 oz) 92.7 kg (204 lb 5.9 oz)    Examination:  Constitutional: NAD Eyes:  lids and conjunctivae normal ENMT: Mucous membranes are moist Neck: normal,  supple Respiratory: clear to auscultation bilaterally, no wheezing, no crackles. Normal respiratory effort. .  Cardiovascular: Regular rate and rhythm, no murmurs / rubs / gallops. No LE edema. Abdomen: no tenderness. Bowel sounds positive. Skin: no rashes Neurologic: Nonfocal Psychiatric: Normal judgment and insight. Alert and oriented x 3. Normal mood.    Data Reviewed: I have independently reviewed following labs and imaging studies   CBC: Recent Labs  Lab 11/18/17 1528 11/18/17 2105  11/19/17 8032 11/20/17 0429 11/21/17 0515 11/22/17 0650 11/23/17 0258  WBC 29.3* 36.1*   < > 12.1* 6.3 7.4 8.3 6.9  NEUTROABS 24.6* 33.2*  --   --   --   --   --  5.2  HGB 6.8* 6.8*   < > 8.3* 7.8* 8.6* 8.8* 8.6*  HCT 27.2* 26.6*   < > 27.5* 26.0* 29.4* 29.9* 30.2*  MCV 73.5* 69.5*   < > 67.6* 67.4* 68.7* 69.4* 69.6*  PLT 708* 572*   < > 213 146* 102* 82* 67*   < > = values in this interval not displayed.   Basic Metabolic Panel: Recent Labs  Lab 11/18/17 1528 11/18/17 2105  11/19/17 1019  11/21/17 0515 11/21/17 1709 11/22/17 0800 11/22/17 1439 11/23/17 0258  NA 133* 136   < > 146*   < > 155* 146* 139 141 138  K 6.9* 5.3*   < > 4.0   < > 2.9* 3.2* 5.8* 3.3* 3.2*  CL 100* 106   < > 115*   < > 119* 112* 108 107 105  CO2 4* <7*   < > 20*   < > _0 GLUCOSE 741* 605*   < > 155*   < > 85 134* 178* 235* 222*  BUN 54* 57*   < > 43*   < > _1 CREATININE 2.99* 3.62*   < > 2.00*   < > 0.84 0.77 0.74 0.94 0.91  CALCIUM 8.3* 7.9*   < > 7.9*   < > 8.6* 8.3* 7.8* 7.9* 7.9*  MG 3.1* 2.6*  --   --   --   --   --   --   --   --   PHOS  --  7.3*  --  2.6  --   --   --   --   --   --    < > = values in this interval not displayed.   GFR: Estimated Creatinine Clearance: 64.9 mL/min (by C-G formula based on SCr of 0.91 mg/dL). Liver Function Tests: Recent Labs  Lab 11/18/17 1528 11/18/17 2105 11/21/17 0515  AST 24 47* 26  ALT _2 ALKPHOS 104 109 80  BILITOT 1.9*  1.8* 0.7  PROT 6.0* 5.6* 5.3*  ALBUMIN 3.0* 2.9* 2.5*   Recent Labs  Lab 11/21/17 1000  LIPASE 35   Recent Labs  Lab 11/18/17 1609 11/19/17 0526 11/20/17 0429  AMMONIA 103* 18 18   Coagulation Profile: Recent Labs  Lab 11/18/17 1528 11/23/17 0258  INR 1.57 1.09   Cardiac Enzymes: Recent Labs  Lab 11/18/17 1528 11/18/17 2105 11/19/17 0417 11/19/17 1019 11/19/17  1617  TROPONINI 0.32* 1.18* 3.10* 3.20* 2.41*   BNP (last 3 results) No results for input(s): PROBNP in the last 8760 hours. HbA1C: Recent Labs    11/22/17 1439  HGBA1C 7.4*   CBG: Recent Labs  Lab 11/22/17 1553 11/22/17 1936 11/23/17 0008 11/23/17 0443 11/23/17 0836  GLUCAP 238* 198* 199* 205* 176*   Lipid Profile: Recent Labs    11/22/17 0800  CHOL 92  HDL 25*  LDLCALC 35  TRIG 159*  CHOLHDL 3.7   Thyroid Function Tests: No results for input(s): TSH, T4TOTAL, FREET4, T3FREE, THYROIDAB in the last 72 hours. Anemia Panel: No results for input(s): VITAMINB12, FOLATE, FERRITIN, TIBC, IRON, RETICCTPCT in the last 72 hours. Urine analysis:    Component Value Date/Time   COLORURINE YELLOW 11/18/2017 1520   APPEARANCEUR CLEAR 11/18/2017 1520   LABSPEC 1.018 11/18/2017 1520   PHURINE 5.0 11/18/2017 1520   GLUCOSEU >=500 (A) 11/18/2017 1520   HGBUR SMALL (A) 11/18/2017 1520   BILIRUBINUR NEGATIVE 11/18/2017 1520   KETONESUR 80 (A) 11/18/2017 1520   PROTEINUR NEGATIVE 11/18/2017 1520   UROBILINOGEN 0.2 07/30/2012 2228   NITRITE NEGATIVE 11/18/2017 1520   LEUKOCYTESUR NEGATIVE 11/18/2017 1520   Sepsis Labs: Invalid input(s): PROCALCITONIN, LACTICIDVEN  Recent Results (from the past 240 hour(s))  MRSA PCR Screening     Status: None   Collection Time: 11/18/17  8:25 PM  Result Value Ref Range Status   MRSA by PCR NEGATIVE NEGATIVE Final    Comment:        The GeneXpert MRSA Assay (FDA approved for NASAL specimens only), is one component of a comprehensive MRSA  colonization surveillance program. It is not intended to diagnose MRSA infection nor to guide or monitor treatment for MRSA infections. Performed at [facility], [place] [address] [place], [place] [address]   Culture, blood (routine x 2)     Status: Abnormal   Collection Time: 11/18/17  9:02 PM  Result Value Ref Range Status   Specimen Description BLOOD CENTRAL LINE  Final   Special Requests   Final    BOTTLES DRAWN AEROBIC AND ANAEROBIC Blood Culture adequate volume   Culture  Setup Time   Final    GRAM POSITIVE COCCI IN CLUSTERS ANAEROBIC BOTTLE ONLY Organism ID to follow CRITICAL RESULT CALLED TO, READ BACK BY AND VERIFIED WITH: [place]ie Green PharmD 9:55 11/20/17 ([email or web address])    Culture (A)  Final    STAPHYLOCOCCUS SPECIES (COAGULASE NEGATIVE) THE SIGNIFICANCE OF ISOLATING THIS ORGANISM FROM A SINGLE SET OF BLOOD CULTURES WHEN MULTIPLE SETS ARE DRAWN IS UNCERTAIN. PLEASE NOTIFY THE MICROBIOLOGY DEPARTMENT WITHIN ONE WEEK IF SPECIATION AND SENSITIVITIES ARE REQUIRED. Performed at [facility], [place] [address] [place], [place] [address]    Report Status 11/22/2017 FINAL  Final  Blood Culture ID Panel (Reflexed)     Status: Abnormal   Collection Time: 11/18/17  9:02 PM  Result Value Ref Range Status   Enterococcus species NOT DETECTED NOT DETECTED Final   Listeria monocytogenes NOT DETECTED NOT DETECTED Final   Staphylococcus species DETECTED (A) NOT DETECTED Final    Comment: Methicillin (oxacillin) susceptible coagulase negative staphylococcus. Possible blood culture contaminant (unless isolated from more than one blood culture draw or clinical case suggests pathogenicity). No antibiotic treatment is indicated for blood  culture contaminants. CRITICAL RESULT CALLED TO, READ BACK BY AND VERIFIED WITH: [place]ie Green PharmD 9:55 11/20/17 ([email or web address])    Staphylococcus aureus NOT DETECTED NOT DETECTED Final   Methicillin resistance  NOT DETECTED NOT DETECTED Final   Streptococcus  species NOT DETECTED NOT DETECTED Final   Streptococcus agalactiae NOT DETECTED NOT DETECTED Final   Streptococcus pneumoniae NOT DETECTED NOT DETECTED Final   Streptococcus pyogenes NOT DETECTED NOT DETECTED Final   Acinetobacter baumannii NOT DETECTED NOT DETECTED Final   Enterobacteriaceae species NOT DETECTED NOT DETECTED Final   Enterobacter cloacae complex NOT DETECTED NOT DETECTED Final   Escherichia coli NOT DETECTED NOT DETECTED Final   Klebsiella oxytoca NOT DETECTED NOT DETECTED Final   Klebsiella pneumoniae NOT DETECTED NOT DETECTED Final   Proteus species NOT DETECTED NOT DETECTED Final   Serratia marcescens NOT DETECTED NOT DETECTED Final   Haemophilus influenzae NOT DETECTED NOT DETECTED Final   Neisseria meningitidis NOT DETECTED NOT DETECTED Final   Pseudomonas aeruginosa NOT DETECTED NOT DETECTED Final   Candida albicans NOT DETECTED NOT DETECTED Final   Candida glabrata NOT DETECTED NOT DETECTED Final   Candida krusei NOT DETECTED NOT DETECTED Final   Candida parapsilosis NOT DETECTED NOT DETECTED Final   Candida tropicalis NOT DETECTED NOT DETECTED Final    Comment: Performed at [facility], [place] [address], [place], [place] [address]  Culture, blood (routine x 2)     Status: None (Preliminary result)   Collection Time: 11/19/17  3:54 AM  Result Value Ref Range Status   Specimen Description BLOOD RIGHT THUMB  Final   Special Requests   Final    BOTTLES DRAWN AEROBIC AND ANAEROBIC Blood Culture adequate volume   Culture   Final    NO GROWTH 3 DAYS Performed at [facility], [address] [place], [place] [address]    Report Status PENDING  Incomplete  Culture, respiratory (NON-Expectorated)     Status: None   Collection Time: 11/19/17  5:42 PM  Result Value Ref Range Status   Specimen Description TRACHEAL ASPIRATE  Final   Special Requests NONE  Final   Gram Stain   Final    MODERATE WBC PRESENT, PREDOMINANTLY PMN RARE SQUAMOUS EPITHELIAL CELLS  PRESENT ABUNDANT GRAM POSITIVE COCCI IN CLUSTERS FEW BUDDING YEAST SEEN Performed at [facility], [place] [address] [place], [place] [address]    Culture MODERATE KLEBSIELLA PNEUMONIAE  Final   Report Status 11/21/2017 FINAL  Final   Organism ID, Bacteria KLEBSIELLA PNEUMONIAE  Final      Susceptibility   Klebsiella pneumoniae - MIC*    AMPICILLIN >=32 RESISTANT Resistant     CEFAZOLIN <=4 SENSITIVE Sensitive     CEFEPIME <=1 SENSITIVE Sensitive     CEFTAZIDIME <=1 SENSITIVE Sensitive     CEFTRIAXONE <=1 SENSITIVE Sensitive     CIPROFLOXACIN <=0.25 SENSITIVE Sensitive     GENTAMICIN <=1 SENSITIVE Sensitive     IMIPENEM <=0.25 SENSITIVE Sensitive     TRIMETH/SULFA <=20 SENSITIVE Sensitive     AMPICILLIN/SULBACTAM 8 SENSITIVE Sensitive     PIP/TAZO 16 SENSITIVE Sensitive     Extended ESBL NEGATIVE Sensitive     * MODERATE KLEBSIELLA PNEUMONIAE      Radiology Studies: Ct Angio Head W Or Wo Contrast  Addendum Date: 11/22/2017   ADDENDUM REPORT: 11/22/2017 10:01 ADDENDUM: Additional note made of RIGHT upper lobe opacity, possible pneumonia. Layering fluid RIGHT maxillary sinus suggesting acuity. Electronically Signed   By: Staci Righter M.D.   On: 11/22/2017 10:01   Result Date: 11/22/2017 CLINICAL DATA:  Follow-up cerebrovascular accident. Encephalopathy with respiratory arrest. EXAM: CT ANGIOGRAPHY HEAD TECHNIQUE: Multidetector CT imaging of the head  was performed using the standard protocol during bolus administration of intravenous contrast. Multiplanar CT image reconstructions and MIPs were obtained to evaluate the vascular anatomy. CONTRAST:  72m ISOVUE-370 IOPAMIDOL (ISOVUE-370) INJECTION 76% COMPARISON:  MRI brain 11/21/2017. FINDINGS: CTA NECK Aortic arch: Standard branching. Imaged portion shows no evidence of aneurysm or dissection. No significant stenosis of the major arch vessel origins. Right carotid system: Minor calcific atheromatous change. No evidence of dissection,  stenosis (50% or greater) or occlusion. Left carotid system: No significant atheromatous disease. No evidence of dissection, stenosis (50% or greater) or occlusion. Vertebral arteries: LEFT vertebral dominant. No evidence of dissection, stenosis (50% or greater) or occlusion. CTA HEAD Anterior circulation: Skull base internal carotid arteries widely patent. No signs of large vessel occlusion. No significant anterior circulation stenosis, proximal occlusion, aneurysm, or vascular malformation. Posterior circulation: Both vertebrals are patent, and contribute to the basilar, LEFT dominant. No significant stenosis, proximal occlusion, aneurysm, or vascular malformation. Venous sinuses: As permitted by contrast timing, patent. Anatomic variants: None of significance. Delayed phase: No abnormal intracranial enhancement. BILATERAL cortical hypodensities are developing, correlating with the observed areas of restricted diffusion on MR. IMPRESSION: No intracranial or extracranial stenosis or occlusion. BILATERAL cortical hypodensities are developing, without associated abnormal intracranial enhancement. In the appropriate clinical setting, these could represent watershed infarcts. Electronically Signed: By: JStaci RighterM.D. On: 11/22/2017 09:37   Ct Head Wo Contrast  Result Date: 11/21/2017 CLINICAL DATA:  67year old female confused with repetitive speech. Recent respiratory arrest and lumbar surgery. Subsequent encounter. EXAM: CT HEAD WITHOUT CONTRAST TECHNIQUE: Contiguous axial images were obtained from the base of the skull through the vertex without intravenous contrast. COMPARISON:  11/18/2017 and 10/29/2014 head CT. 01/02/2015 brain MR. FINDINGS: Brain: New small hypodensity posterosuperior right frontal lobe possibly small acute infarct. No intracranial hemorrhage. Mild atrophy. No intracranial mass lesion noted on this unenhanced exam. Vascular: Vascular calcifications. Skull: No acute abnormality.  Sinuses/Orbits: Exophthalmos without acute orbital abnormality. Pansinus mucosal thickening/partial opacification. Other: Partial opacification mastoid air cells bilaterally. No obstructing lesion of eustachian tube noted. Middle ear cavities are clear bilaterally. IMPRESSION: New small hypodensity posterosuperior right frontal lobe possibly small acute infarct. No intracranial hemorrhage. Mild atrophy. Exophthalmos. Pansinus mucosal thickening/partial opacification. Partial opacification mastoid air cells bilaterally. No obstructing lesion of eustachian tube noted. These results will be called to the ordering clinician or representative by the Radiologist Assistant, and communication documented in the PACS or zVision Dashboard. Electronically Signed   By: SGenia DelM.D.   On: 11/21/2017 11:11   Ct Angio Neck W Or Wo Contrast  Addendum Date: 11/22/2017   ADDENDUM REPORT: 11/22/2017 10:01 ADDENDUM: Additional note made of RIGHT upper lobe opacity, possible pneumonia. Layering fluid RIGHT maxillary sinus suggesting acuity. Electronically Signed   By: JStaci RighterM.D.   On: 11/22/2017 10:01   Result Date: 11/22/2017 CLINICAL DATA:  Follow-up cerebrovascular accident. Encephalopathy with respiratory arrest. EXAM: CT ANGIOGRAPHY HEAD TECHNIQUE: Multidetector CT imaging of the head was performed using the standard protocol during bolus administration of intravenous contrast. Multiplanar CT image reconstructions and MIPs were obtained to evaluate the vascular anatomy. CONTRAST:  55mISOVUE-370 IOPAMIDOL (ISOVUE-370) INJECTION 76% COMPARISON:  MRI brain 11/21/2017. FINDINGS: CTA NECK Aortic arch: Standard branching. Imaged portion shows no evidence of aneurysm or dissection. No significant stenosis of the major arch vessel origins. Right carotid system: Minor calcific atheromatous change. No evidence of dissection, stenosis (50% or greater) or occlusion. Left carotid system: No significant atheromatous disease.  No evidence  of dissection, stenosis (50% or greater) or occlusion. Vertebral arteries: LEFT vertebral dominant. No evidence of dissection, stenosis (50% or greater) or occlusion. CTA HEAD Anterior circulation: Skull base internal carotid arteries widely patent. No signs of large vessel occlusion. No significant anterior circulation stenosis, proximal occlusion, aneurysm, or vascular malformation. Posterior circulation: Both vertebrals are patent, and contribute to the basilar, LEFT dominant. No significant stenosis, proximal occlusion, aneurysm, or vascular malformation. Venous sinuses: As permitted by contrast timing, patent. Anatomic variants: None of significance. Delayed phase: No abnormal intracranial enhancement. BILATERAL cortical hypodensities are developing, correlating with the observed areas of restricted diffusion on MR. IMPRESSION: No intracranial or extracranial stenosis or occlusion. BILATERAL cortical hypodensities are developing, without associated abnormal intracranial enhancement. In the appropriate clinical setting, these could represent watershed infarcts. Electronically Signed: By: Staci Righter M.D. On: 11/22/2017 09:37   Mr Brain Wo Contrast  Result Date: 11/21/2017 CLINICAL DATA:  Encephalopathy. Recent lumbar spine surgery, respiratory arrest, and diabetic ketoacidosis. Persistent altered mental status after extubation. EXAM: MRI HEAD WITHOUT CONTRAST TECHNIQUE: Multiplanar, multiecho pulse sequences of the brain and surrounding structures were obtained without intravenous contrast. COMPARISON:  Head CT 11/21/2017 and MRI 01/02/2015 FINDINGS: Multiple sequences are mildly to moderately motion degraded. Brain: There are several small acute cortical/subcortical infarcts in the high posterior frontal lobes bilaterally. No intracranial hemorrhage, mass, midline shift, or extra-axial fluid collection is identified. There is mild cerebral atrophy. No significant chronic white matter disease  is seen. Vascular: Major intracranial vascular flow voids are preserved. Skull and upper cervical spine: Unremarkable bone marrow signal. Sinuses/Orbits: Bilateral cataract extraction. Mild mucosal thickening throughout the paranasal sinuses. Moderate bilateral mastoid effusions. Other: None. IMPRESSION: Small acute bilateral posterior frontal lobe infarcts. Electronically Signed   By: Logan Bores M.D.   On: 11/21/2017 17:28     Scheduled Meds: . aspirin  325 mg Oral Daily   Or  . aspirin  300 mg Rectal Daily  . chlorhexidine gluconate (MEDLINE KIT)  15 mL Mouth Rinse BID  . insulin aspart  0-20 Units Subcutaneous Q4H  . insulin glargine  20 Units Subcutaneous Daily  . levothyroxine  75 mcg Intravenous Daily  . mouth rinse  15 mL Mouth Rinse QID  . pantoprazole  40 mg Oral QHS  . sodium chloride flush  3 mL Intravenous Q12H   Continuous Infusions: . sodium chloride 10 mL/hr at 11/23/17 0700  . sodium chloride    . sodium chloride    . acetaminophen Stopped (11/23/17 0113)  .  ceFAZolin (ANCEF) IV 1 g (11/23/17 0559)  . heparin 1,500 Units/hr (11/23/17 0700)       Time spent: 35 minutes    Marzetta Board, MD, PhD [facility] Pager [phone]  If 7PM-7AM, please contact night-coverage [email or web address] Password [facility] 11/23/2017, 10:33 AM

## 2017-11-23 NOTE — Progress Notes (Signed)
NEUROHOSPITALISTS STROKE TEAM - DAILY PROGRESS NOTE       SUBJECTIVE Patient  Is extubated. She is awake alert interactive. She feels she is back to her neurological baseline. She has no focal deficits. Lower extremity venous dopplers showed soleal vein thrombosis, TEE planned for later today.patient has developed thrombocytopenia on heparin and will be transitioned  To  Oral anticoagulant OBJECTIVE Most recent Vital Signs: Vitals:   11/23/17 1322 11/23/17 1327 11/23/17 1332 11/23/17 1400  BP: (!) 87/39 (!) 98/38 (!) 97/45 (!) 161/84  Pulse:    82  Resp: '20 20 20 18  ' Temp: 98.3 F (36.8 C)     TempSrc: Oral     SpO2: 98% 98% 97% 99%  Weight:      Height:       CBG (last 3)  Recent Labs    11/23/17 0443 11/23/17 0836 11/23/17 1151  GLUCAP 205* 176* 124*    Physical Exam  HEENT-  Normocephalic, no lesions, without obvious abnormality.  Normal external eye and conjunctiva.  Dry mucous membranes  Cardiovascular-S1-S2 audible, pulses palpable throughout   Lungs- normal breath sounds, diminished in the bases, no excessive working breathing.  Saturations within normal limits Abdomen-non tender to palpation with normal bowel sounds  Musculoskeletal-no joint tenderness, deformity or swelling, both hands are in Mitts Skin-warm and dry,  Neuro:  Mental Status: Awake alert oriented 3. Normal speech and language function   No dysarthria noted at this time,  Cranial Nerves: II: Blinks to threat, unable to accurately assess visual fields this patient with decreased participation III,IV, VI: ptosis not present,  pupils equal, round, reactive to light  V,VII: smile symmetric, facial light touch sensation normal bilaterally, grimaces with harsh stimuli VIII: Hearing intact to voice IX,X: uvula rises symmetrically XI: Decreased participation, patient did not attempt XII: midline tongue extension Motor: Right :  Upper extremity    5/5                                      Left:     Upper extremity   5/5             Lower extremity   5/5                                                  Lower extremity   5/5 Tone and bulk:normal tone throughout; no atrophy noted Sensory: Pinprick and light touch intact throughout, bilaterally Deep Tendon Reflexes: Patient with 2-3 beats of clonus in the left leg , +3 and brisk on  left patella with cross adduction on the left,  +2 on the right as well as +2 on brachioradialis and biceps bilaterally Plantars: Right: downgoing                                Left: downgoing Cerebellar: Unable to accurately assess due to patient altered mental status and  diminshed participation.  Gait: Deferred  IV Fluid Intake:   . sodium chloride 10 mL/hr at 11/23/17 0700  . sodium chloride    . acetaminophen Stopped (11/23/17 0113)  .  ceFAZolin (ANCEF) IV Stopped (11/23/17 1433)    MEDICATIONS  . aspirin  325 mg Oral Daily   Or  . aspirin  300 mg Rectal Daily  . chlorhexidine gluconate (MEDLINE KIT)  15 mL Mouth Rinse BID  . insulin aspart  0-20 Units Subcutaneous Q4H  . insulin glargine  20 Units Subcutaneous Daily  . levothyroxine  75 mcg Intravenous Daily  . mouth rinse  15 mL Mouth Rinse QID  . pantoprazole  40 mg Oral QHS  . potassium chloride  40 mEq Oral BID  . rivaroxaban  15 mg Oral BID   Followed by  . [START ON 12/14/2017] rivaroxaban  20 mg Oral Q supper  . sodium chloride flush  3 mL Intravenous Q12H   PRN:  sodium chloride, sodium chloride, acetaminophen, acetaminophen, dextrose, LORazepam, sodium chloride flush  Diet:  Diet clear liquid Room service appropriate? Yes; Fluid consistency: Thin   CLINICALLY SIGNIFICANT STUDIES Basic Metabolic Panel:  Recent Labs  Lab 11/18/17 1528 11/18/17 2105  11/19/17 1019  11/22/17 1439 11/23/17 0258 11/23/17 1000  NA 133* 136   < > 146*   < > 141 138  --   K 6.9* 5.3*   < > 4.0   < > 3.3* 3.2* 3.1*  CL 100* 106   < > 115*   < > 107  105  --   CO2 4* <7*   < > 20*   < > 24 23  --   GLUCOSE 741* 605*   < > 155*   < > 235* 222*  --   BUN 54* 57*   < > 43*   < > 11 10  --   CREATININE 2.99* 3.62*   < > 2.00*   < > 0.94 0.91  --   CALCIUM 8.3* 7.9*   < > 7.9*   < > 7.9* 7.9*  --   MG 3.1* 2.6*  --   --   --   --   --   --   PHOS  --  7.3*  --  2.6  --   --   --   --    < > = values in this interval not displayed.   Liver Function Tests:  Recent Labs  Lab 11/18/17 2105 11/21/17 0515  AST 47* 26  ALT 28 30  ALKPHOS 109 80  BILITOT 1.8* 0.7  PROT 5.6* 5.3*  ALBUMIN 2.9* 2.5*   CBC:  Recent Labs  Lab 11/18/17 2105  11/22/17 0650 11/23/17 0258  WBC 36.1*   < > 8.3 6.9  NEUTROABS 33.2*  --   --  5.2  HGB 6.8*   < > 8.8* 8.6*  HCT 26.6*   < > 29.9* 30.2*  MCV 69.5*   < > 69.4* 69.6*  PLT 572*   < > 82* 67*   < > = values in this interval not displayed.   Coagulation:  Recent Labs  Lab 11/18/17 1528 11/23/17 0258  LABPROT 18.6* 14.0  INR 1.57 1.09   Cardiac Enzymes:  Recent Labs  Lab 11/19/17 0417 11/19/17 1019 11/19/17 1617  TROPONINI 3.10* 3.20* 2.41*   Urinalysis:  Recent Labs  Lab 11/18/17 1520  COLORURINE YELLOW  LABSPEC 1.018  PHURINE 5.0  GLUCOSEU >=500*  HGBUR SMALL*  BILIRUBINUR NEGATIVE  KETONESUR 80*  PROTEINUR NEGATIVE  NITRITE NEGATIVE  LEUKOCYTESUR NEGATIVE   Lipid Panel    Component Value Date/Time   CHOL 92 11/22/2017 0800   TRIG 159 (H) 11/22/2017 0800   HDL 25 (L) 11/22/2017 0800   CHOLHDL 3.7 11/22/2017 0800   VLDL 32 11/22/2017 0800   LDLCALC 35 11/22/2017 0800   HgbA1C  Lab Results  Component Value Date   HGBA1C 7.4 (H) 11/22/2017    Urine Drug Screen:      Component Value Date/Time   LABOPIA POSITIVE (A) 11/18/2017 1520   COCAINSCRNUR NONE DETECTED 11/18/2017 1520   LABBENZ POSITIVE (A) 11/18/2017 1520   AMPHETMU NONE DETECTED 11/18/2017 1520   THCU NONE DETECTED 11/18/2017 1520   LABBARB NONE DETECTED 11/18/2017 1520    Alcohol Level:  Recent  Labs  Lab 11/18/17 1609  ETH <10    Ct Angio Head/ Neck 11/22/17 IMPRESSION:  No intracranial or extracranial stenosis or occlusion. BILATERAL cortical hypodensities are developing, without associated abnormal intracranial enhancement. In the appropriate clinical setting, these could represent watershed infarcts.  Addendum Additional note made of RIGHT upper lobe opacity, possible pneumonia. Layering fluid RIGHT maxillary sinus suggesting acuity.  Ct Head Wo Contrast 11/21/2017 IMPRESSION:  New small hypodensity posterosuperior right frontal lobe possibly small acute infarct. No intracranial hemorrhage. Mild atrophy. Exophthalmos. Pansinus mucosal thickening/partial opacification. Partial opacification mastoid air cells bilaterally. No obstructing lesion of eustachian tube noted.  Mr Brain Wo Contrast 11/21/2017 IMPRESSION:  Small acute bilateral posterior frontal lobe infarcts.   EKG   normal sinus rhythm        Transcranial Doppler bubble study attempted but could not be done due to poor windows laterally Venous Doppler bilateral lower extremity positive for left soleal vein thrombosis EEG    drowsy and asleep EEG is abnormal due to mild diffuse background slowing.  ASSESSMENT  67 y.o. female with Hx of IDDM, hypothyroidism, G1DD (LVEF 65-70% 09/08/17), who recently underwent lumbar spine surgery on 4/12 taken to [facility] emergently for respiratory arrest en route, found to be in DKA with AG 29, BS > 700, pH 6.8 pCO2 below detectable range. She was transferred to [facility] ICU on mechanical ventilation. Levophed was started for initial hypotension, treatment for DKA, and empiric antibiotics.  Since extubation patient continued to have persistent encephalopathy versus agitated delirium which was thought to have unclear etiology of symptoms metabolic derangements have since improved and she is under treatment. CT head done on 4/16 showed Small hypodensity seen on the [facility] from today, new from 4/13. Neuro  consulted  1.  Ischemic stroke - bilateral posterior frontal lobe  infarcts -rule out watershed stroke versus embolic source.  Patient currently on aspirin and unable to tolerate statin due to allergy.  Patient has had full stroke workup, currently pending TEE. Lower extremity venous Dopplers positive for DVT recommend anticoagulation with eliquis on Xarelto due to thrombocytopenia on heparinContinue early rehabilitation with physical therapy, occupational therapy and speech therapy  2. Metabolic/Toxic encephalopathy.  Patient presented with altered mental status as well as multiple medical comorbidities, possible benzo withdrawal, ICU delirium, DKA, acute respiratory failure s/p intubation, acute renal insufficiency. Infectious work-up patient is work-up unremarkable.    Patient's persistent encephalopathy is not congruent with location of the stroke and and actually should be improvement secondary to aggressive metabolic treatment.  Will order EEG to rule out possible seizure.  Also, patient may have possible underlying baseline congnitive impairment  3.  Possible Benzo withdrawal-patient has been  on Restoril long-term and currently abruptly stopped recommend resuming low-dose benzo or resume temazepam. 4.  Acute respiratory failure patient was intubated currently extubated 5.  Hypernatremia 6.  Acute anemia 7. DKA -resolving 8. Right upper lobe PNA - trach aspirate + Kleb PNA  Hospital day # 5     11/23/2017 ATTENDING ASSESSMENT   I have personally examined this patient, reviewed notes, independently viewed imaging studies, participated in medical decision making and plan of care.ROS completed by me personally and pertinent positives fully documented  I have made any additions or clarifications directly to the above note  She presented with encephalopathy secondary to diabetic ketoacidosis, hypoxia, hypertension following recent back surgery and MRI shows bilateral small embolic infarcts in  different vascular territories. Mechanism could be related to bone marrow fat embolism from the back surgery versus paradoxical embolism from DVT continue anticoagulation due to DVT with eliquis on Xarelto.. Long discussion with Dr Cruzita Lederer. and critical care team and answered questions.check TEE results. Stroke team will sign off. Kindly call for questions. This patient is critically ill and at significant risk of neurological worsening, death and care requires constant monitoring of vital signs, hemodynamics,respiratory and cardiac monitoring, extensive review of multiple databases, frequent neurological assessment, discussion with family, other specialists and medical decision making of high complexity.I have made any additions or clarifications directly to the above note.This critical care time does not reflect procedure time, or teaching time or supervisory time of PA/NP/Med Resident etc but could involve care discussion time.  I spent 30 minutes of neurocritical care time  in the care of  this patient.      Antony Contras, MD Medical Director [facility] Pager: [phone] 11/23/2017 2:58 PM     To contact Stroke Continuity provider, please refer to [email or web address] After hours, contact General Neurology

## 2017-11-23 NOTE — Progress Notes (Signed)
PROGRESS NOTE  [name]:[id number] DOB: [birth year] DOA: 11/18/2017 PCP: Harlan Stains, MD   LOS: 5 days   Brief Narrative / Interim history: 67 yo F with IDDM, hypothyroidism, G1DD (EF 65-70%), who recently had L spine surgery on 4/12, was taken emergently to the ER for DKA, metabolic acidosis and respiratory arrest en route, she was intubated and admitted to the ICU. She was started on pressors, empiric antibiotics, insulin.   LINES / TUBES: LIJ CVC 4/13>>> Foley catheter 4/16>>>  CULTURES: 4/13 Blood Cx: Staph epi 1 out of 2 4/14 Resp Cx: Klebsiella pneumoniae  ANTIBIOTICS: Cefepime 4/13-4/14 Ancef 4/16>>  SIGNIFICANT EVENTS:  4/13 Admitted intubated for respiratory arrest 4/15 Extubated 4/18 TRH took over   Assessment & Plan: Active Problems:   DKA (diabetic ketoacidoses) ([place])   Diabetic ketoacidosis ([place])   Acute respiratory failure with hypoxia (HCC)   Acute renal failure ([place])   Endotracheal tube present   Septic shock (HCC)   Cerebral thrombosis with cerebral infarction   Acute encephalopathy   Acute encephalopathy -Likely multifactorial in the setting of DKA, ICU stay -She is improving, still a little bit sleepy this morning but alert and oriented x4, appropriate -EEG done on 4/17, without epileptiform discharges  Acute CVA -MRI of the brain done on 4/16 showed small acute bilateral posterior frontal lobe infarcts, concern for watershed versus embolic -Neurology following, plan for a TEE today -A1c 7.4, lipid panel and LDL of 35 -CT angiogram without any significant intracranial or extracranial stenosis or occlusion  Acute hypoxic respiratory failure -In the setting of acute encephalopathy, patient extubated and on room air this morning  Hypertension -Permissive hypertension for acute stroke  Troponin elevation -No chest pain or ACS type symptoms, likely demand  Acute kidney injury -In the setting of hypotension/shock, now  resolved  Presumed pneumonia -With leukocytosis, CT scan did show upper lobe opacity, has been intubated and tracheal aspirate is positive for Klebsiella -Treating with Ancef for total of 5 days  Diabetes mellitus -Continue basal insulin and sliding scale  DVT -Ultrasound done on 4/17 showed DVT in the left soleal vein, started on heparin however no discontinue heparin as below and place on Xarelto  Thrombocytopenia -Platelets are progressively getting worse today, discussed with Dr. Benay Spice with oncology, although it does not fit very characteristically with HIT could not exclude at this point given the fact that she has been receiving subcutaneous heparin prior to being on heparin infusion -send HIT panel, change anticoagulation, obtain peripheral smear -Discontinue heparin, also discussed with Dr. Leonie Man, okay with Xarelto -Closely monitor for bleeding   DVT prophylaxis: Xarelto Code Status: Full code Family Communication: no family at bedside Disposition Plan: home when ready   Consultants:   Neurology   PCCM  Hematology - phone, Dr. Benay Spice  Procedures:   TEE -pending   Subjective: -Doing better this morning, no chest pain, shortness of breath, no abdominal pain, nausea or vomiting  Objective: Vitals:   11/23/17 0600 11/23/17 0700 11/23/17 0800 11/23/17 0833  BP: (!) 122/44 (!) 121/53 (!) 129/53   Pulse: 74 77 80   Resp:      Temp:    98.4 F (36.9 C)  TempSrc:    Oral  SpO2: 100% 99% 100%   Weight:      Height:        Intake/Output Summary (Last 24 hours) at 11/23/2017 1033 Last data filed at 11/23/2017 0703 Gross per 24 hour  Intake 866.25 ml  Output 1475 ml  Net -608.75 ml   Filed Weights   11/20/17 0420 11/21/17 0500 11/22/17 0500  Weight: 97.2 kg (214 lb 4.6 oz) 94 kg (207 lb 3.7 oz) 92.7 kg (204 lb 5.9 oz)    Examination:  Constitutional: NAD Eyes:  lids and conjunctivae normal ENMT: Mucous membranes are moist Neck: normal,  supple Respiratory: clear to auscultation bilaterally, no wheezing, no crackles. Normal respiratory effort. .  Cardiovascular: Regular rate and rhythm, no murmurs / rubs / gallops. No LE edema. Abdomen: no tenderness. Bowel sounds positive. Skin: no rashes Neurologic: Nonfocal Psychiatric: Normal judgment and insight. Alert and oriented x 3. Normal mood.    Data Reviewed: I have independently reviewed following labs and imaging studies   CBC: Recent Labs  Lab 11/18/17 1528 11/18/17 2105  11/19/17 8032 11/20/17 0429 11/21/17 0515 11/22/17 0650 11/23/17 0258  WBC 29.3* 36.1*   < > 12.1* 6.3 7.4 8.3 6.9  NEUTROABS 24.6* 33.2*  --   --   --   --   --  5.2  HGB 6.8* 6.8*   < > 8.3* 7.8* 8.6* 8.8* 8.6*  HCT 27.2* 26.6*   < > 27.5* 26.0* 29.4* 29.9* 30.2*  MCV 73.5* 69.5*   < > 67.6* 67.4* 68.7* 69.4* 69.6*  PLT 708* 572*   < > 213 146* 102* 82* 67*   < > = values in this interval not displayed.   Basic Metabolic Panel: Recent Labs  Lab 11/18/17 1528 11/18/17 2105  11/19/17 1019  11/21/17 0515 11/21/17 1709 11/22/17 0800 11/22/17 1439 11/23/17 0258  NA 133* 136   < > 146*   < > 155* 146* 139 141 138  K 6.9* 5.3*   < > 4.0   < > 2.9* 3.2* 5.8* 3.3* 3.2*  CL 100* 106   < > 115*   < > 119* 112* 108 107 105  CO2 4* <7*   < > 20*   < > _0 GLUCOSE 741* 605*   < > 155*   < > 85 134* 178* 235* 222*  BUN 54* 57*   < > 43*   < > _1 CREATININE 2.99* 3.62*   < > 2.00*   < > 0.84 0.77 0.74 0.94 0.91  CALCIUM 8.3* 7.9*   < > 7.9*   < > 8.6* 8.3* 7.8* 7.9* 7.9*  MG 3.1* 2.6*  --   --   --   --   --   --   --   --   PHOS  --  7.3*  --  2.6  --   --   --   --   --   --    < > = values in this interval not displayed.   GFR: Estimated Creatinine Clearance: 64.9 mL/min (by C-G formula based on SCr of 0.91 mg/dL). Liver Function Tests: Recent Labs  Lab 11/18/17 1528 11/18/17 2105 11/21/17 0515  AST 24 47* 26  ALT _2 ALKPHOS 104 109 80  BILITOT 1.9*  1.8* 0.7  PROT 6.0* 5.6* 5.3*  ALBUMIN 3.0* 2.9* 2.5*   Recent Labs  Lab 11/21/17 1000  LIPASE 35   Recent Labs  Lab 11/18/17 1609 11/19/17 0526 11/20/17 0429  AMMONIA 103* 18 18   Coagulation Profile: Recent Labs  Lab 11/18/17 1528 11/23/17 0258  INR 1.57 1.09   Cardiac Enzymes: Recent Labs  Lab 11/18/17 1528 11/18/17 2105 11/19/17 0417 11/19/17 1019 11/19/17  1617  TROPONINI 0.32* 1.18* 3.10* 3.20* 2.41*   BNP (last 3 results) No results for input(s): PROBNP in the last 8760 hours. HbA1C: Recent Labs    11/22/17 1439  HGBA1C 7.4*   CBG: Recent Labs  Lab 11/22/17 1553 11/22/17 1936 11/23/17 0008 11/23/17 0443 11/23/17 0836  GLUCAP 238* 198* 199* 205* 176*   Lipid Profile: Recent Labs    11/22/17 0800  CHOL 92  HDL 25*  LDLCALC 35  TRIG 159*  CHOLHDL 3.7   Thyroid Function Tests: No results for input(s): TSH, T4TOTAL, FREET4, T3FREE, THYROIDAB in the last 72 hours. Anemia Panel: No results for input(s): VITAMINB12, FOLATE, FERRITIN, TIBC, IRON, RETICCTPCT in the last 72 hours. Urine analysis:    Component Value Date/Time   COLORURINE YELLOW 11/18/2017 1520   APPEARANCEUR CLEAR 11/18/2017 1520   LABSPEC 1.018 11/18/2017 1520   PHURINE 5.0 11/18/2017 1520   GLUCOSEU >=500 (A) 11/18/2017 1520   HGBUR SMALL (A) 11/18/2017 1520   BILIRUBINUR NEGATIVE 11/18/2017 1520   KETONESUR 80 (A) 11/18/2017 1520   PROTEINUR NEGATIVE 11/18/2017 1520   UROBILINOGEN 0.2 07/30/2012 2228   NITRITE NEGATIVE 11/18/2017 1520   LEUKOCYTESUR NEGATIVE 11/18/2017 1520   Sepsis Labs: Invalid input(s): PROCALCITONIN, LACTICIDVEN  Recent Results (from the past 240 hour(s))  MRSA PCR Screening     Status: None   Collection Time: 11/18/17  8:25 PM  Result Value Ref Range Status   MRSA by PCR NEGATIVE NEGATIVE Final    Comment:        The GeneXpert MRSA Assay (FDA approved for NASAL specimens only), is one component of a comprehensive MRSA  colonization surveillance program. It is not intended to diagnose MRSA infection nor to guide or monitor treatment for MRSA infections. Performed at [facility], [place] [address] [place], [place] [address]   Culture, blood (routine x 2)     Status: Abnormal   Collection Time: 11/18/17  9:02 PM  Result Value Ref Range Status   Specimen Description BLOOD CENTRAL LINE  Final   Special Requests   Final    BOTTLES DRAWN AEROBIC AND ANAEROBIC Blood Culture adequate volume   Culture  Setup Time   Final    GRAM POSITIVE COCCI IN CLUSTERS ANAEROBIC BOTTLE ONLY Organism ID to follow CRITICAL RESULT CALLED TO, READ BACK BY AND VERIFIED WITH: Leonie Green PharmD 9:55 11/20/17 ([email or web address])    Culture (A)  Final    STAPHYLOCOCCUS SPECIES (COAGULASE NEGATIVE) THE SIGNIFICANCE OF ISOLATING THIS ORGANISM FROM A SINGLE SET OF BLOOD CULTURES WHEN MULTIPLE SETS ARE DRAWN IS UNCERTAIN. PLEASE NOTIFY THE MICROBIOLOGY DEPARTMENT WITHIN ONE WEEK IF SPECIATION AND SENSITIVITIES ARE REQUIRED. Performed at [facility], [place] [address] [place], [place] [address]    Report Status 11/22/2017 FINAL  Final  Blood Culture ID Panel (Reflexed)     Status: Abnormal   Collection Time: 11/18/17  9:02 PM  Result Value Ref Range Status   Enterococcus species NOT DETECTED NOT DETECTED Final   Listeria monocytogenes NOT DETECTED NOT DETECTED Final   Staphylococcus species DETECTED (A) NOT DETECTED Final    Comment: Methicillin (oxacillin) susceptible coagulase negative staphylococcus. Possible blood culture contaminant (unless isolated from more than one blood culture draw or clinical case suggests pathogenicity). No antibiotic treatment is indicated for blood  culture contaminants. CRITICAL RESULT CALLED TO, READ BACK BY AND VERIFIED WITH: Leonie Green PharmD 9:55 11/20/17 ([email or web address])    Staphylococcus aureus NOT DETECTED NOT DETECTED Final   Methicillin resistance  NOT DETECTED NOT DETECTED Final   Streptococcus  species NOT DETECTED NOT DETECTED Final   Streptococcus agalactiae NOT DETECTED NOT DETECTED Final   Streptococcus pneumoniae NOT DETECTED NOT DETECTED Final   Streptococcus pyogenes NOT DETECTED NOT DETECTED Final   Acinetobacter baumannii NOT DETECTED NOT DETECTED Final   Enterobacteriaceae species NOT DETECTED NOT DETECTED Final   Enterobacter cloacae complex NOT DETECTED NOT DETECTED Final   Escherichia coli NOT DETECTED NOT DETECTED Final   Klebsiella oxytoca NOT DETECTED NOT DETECTED Final   Klebsiella pneumoniae NOT DETECTED NOT DETECTED Final   Proteus species NOT DETECTED NOT DETECTED Final   Serratia marcescens NOT DETECTED NOT DETECTED Final   Haemophilus influenzae NOT DETECTED NOT DETECTED Final   Neisseria meningitidis NOT DETECTED NOT DETECTED Final   Pseudomonas aeruginosa NOT DETECTED NOT DETECTED Final   Candida albicans NOT DETECTED NOT DETECTED Final   Candida glabrata NOT DETECTED NOT DETECTED Final   Candida krusei NOT DETECTED NOT DETECTED Final   Candida parapsilosis NOT DETECTED NOT DETECTED Final   Candida tropicalis NOT DETECTED NOT DETECTED Final    Comment: Performed at [facility], [place] [address] [place], [place] [address]  Culture, blood (routine x 2)     Status: None (Preliminary result)   Collection Time: 11/19/17  3:54 AM  Result Value Ref Range Status   Specimen Description BLOOD RIGHT THUMB  Final   Special Requests   Final    BOTTLES DRAWN AEROBIC AND ANAEROBIC Blood Culture adequate volume   Culture   Final    NO GROWTH 3 DAYS Performed at [facility], [address], [place], [place] [address]    Report Status PENDING  Incomplete  Culture, respiratory (NON-Expectorated)     Status: None   Collection Time: 11/19/17  5:42 PM  Result Value Ref Range Status   Specimen Description TRACHEAL ASPIRATE  Final   Special Requests NONE  Final   Gram Stain   Final    MODERATE WBC PRESENT, PREDOMINANTLY PMN RARE SQUAMOUS EPITHELIAL CELLS  PRESENT ABUNDANT GRAM POSITIVE COCCI IN CLUSTERS FEW BUDDING YEAST SEEN Performed at [facility], [place] [address], [place], [place] [address]    Culture MODERATE KLEBSIELLA PNEUMONIAE  Final   Report Status 11/21/2017 FINAL  Final   Organism ID, Bacteria KLEBSIELLA PNEUMONIAE  Final      Susceptibility   Klebsiella pneumoniae - MIC*    AMPICILLIN >=32 RESISTANT Resistant     CEFAZOLIN <=4 SENSITIVE Sensitive     CEFEPIME <=1 SENSITIVE Sensitive     CEFTAZIDIME <=1 SENSITIVE Sensitive     CEFTRIAXONE <=1 SENSITIVE Sensitive     CIPROFLOXACIN <=0.25 SENSITIVE Sensitive     GENTAMICIN <=1 SENSITIVE Sensitive     IMIPENEM <=0.25 SENSITIVE Sensitive     TRIMETH/SULFA <=20 SENSITIVE Sensitive     AMPICILLIN/SULBACTAM 8 SENSITIVE Sensitive     PIP/TAZO 16 SENSITIVE Sensitive     Extended ESBL NEGATIVE Sensitive     * MODERATE KLEBSIELLA PNEUMONIAE      Radiology Studies: Ct Angio Head W Or Wo Contrast  Addendum Date: 11/22/2017   ADDENDUM REPORT: 11/22/2017 10:01 ADDENDUM: Additional note made of RIGHT upper lobe opacity, possible pneumonia. Layering fluid RIGHT maxillary sinus suggesting acuity. Electronically Signed   By: Staci Righter M.D.   On: 11/22/2017 10:01   Result Date: 11/22/2017 CLINICAL DATA:  Follow-up cerebrovascular accident. Encephalopathy with respiratory arrest. EXAM: CT ANGIOGRAPHY HEAD TECHNIQUE: Multidetector CT imaging of the head  was performed using the standard protocol during bolus administration of intravenous contrast. Multiplanar CT image reconstructions and MIPs were obtained to evaluate the vascular anatomy. CONTRAST:  72m ISOVUE-370 IOPAMIDOL (ISOVUE-370) INJECTION 76% COMPARISON:  MRI brain 11/21/2017. FINDINGS: CTA NECK Aortic arch: Standard branching. Imaged portion shows no evidence of aneurysm or dissection. No significant stenosis of the major arch vessel origins. Right carotid system: Minor calcific atheromatous change. No evidence of dissection,  stenosis (50% or greater) or occlusion. Left carotid system: No significant atheromatous disease. No evidence of dissection, stenosis (50% or greater) or occlusion. Vertebral arteries: LEFT vertebral dominant. No evidence of dissection, stenosis (50% or greater) or occlusion. CTA HEAD Anterior circulation: Skull base internal carotid arteries widely patent. No signs of large vessel occlusion. No significant anterior circulation stenosis, proximal occlusion, aneurysm, or vascular malformation. Posterior circulation: Both vertebrals are patent, and contribute to the basilar, LEFT dominant. No significant stenosis, proximal occlusion, aneurysm, or vascular malformation. Venous sinuses: As permitted by contrast timing, patent. Anatomic variants: None of significance. Delayed phase: No abnormal intracranial enhancement. BILATERAL cortical hypodensities are developing, correlating with the observed areas of restricted diffusion on MR. IMPRESSION: No intracranial or extracranial stenosis or occlusion. BILATERAL cortical hypodensities are developing, without associated abnormal intracranial enhancement. In the appropriate clinical setting, these could represent watershed infarcts. Electronically Signed: By: JStaci RighterM.D. On: 11/22/2017 09:37   Ct Head Wo Contrast  Result Date: 11/21/2017 CLINICAL DATA:  67year old female confused with repetitive speech. Recent respiratory arrest and lumbar surgery. Subsequent encounter. EXAM: CT HEAD WITHOUT CONTRAST TECHNIQUE: Contiguous axial images were obtained from the base of the skull through the vertex without intravenous contrast. COMPARISON:  11/18/2017 and 10/29/2014 head CT. 01/02/2015 brain MR. FINDINGS: Brain: New small hypodensity posterosuperior right frontal lobe possibly small acute infarct. No intracranial hemorrhage. Mild atrophy. No intracranial mass lesion noted on this unenhanced exam. Vascular: Vascular calcifications. Skull: No acute abnormality.  Sinuses/Orbits: Exophthalmos without acute orbital abnormality. Pansinus mucosal thickening/partial opacification. Other: Partial opacification mastoid air cells bilaterally. No obstructing lesion of eustachian tube noted. Middle ear cavities are clear bilaterally. IMPRESSION: New small hypodensity posterosuperior right frontal lobe possibly small acute infarct. No intracranial hemorrhage. Mild atrophy. Exophthalmos. Pansinus mucosal thickening/partial opacification. Partial opacification mastoid air cells bilaterally. No obstructing lesion of eustachian tube noted. These results will be called to the ordering clinician or representative by the Radiologist Assistant, and communication documented in the PACS or zVision Dashboard. Electronically Signed   By: SGenia DelM.D.   On: 11/21/2017 11:11   Ct Angio Neck W Or Wo Contrast  Addendum Date: 11/22/2017   ADDENDUM REPORT: 11/22/2017 10:01 ADDENDUM: Additional note made of RIGHT upper lobe opacity, possible pneumonia. Layering fluid RIGHT maxillary sinus suggesting acuity. Electronically Signed   By: JStaci RighterM.D.   On: 11/22/2017 10:01   Result Date: 11/22/2017 CLINICAL DATA:  Follow-up cerebrovascular accident. Encephalopathy with respiratory arrest. EXAM: CT ANGIOGRAPHY HEAD TECHNIQUE: Multidetector CT imaging of the head was performed using the standard protocol during bolus administration of intravenous contrast. Multiplanar CT image reconstructions and MIPs were obtained to evaluate the vascular anatomy. CONTRAST:  55mISOVUE-370 IOPAMIDOL (ISOVUE-370) INJECTION 76% COMPARISON:  MRI brain 11/21/2017. FINDINGS: CTA NECK Aortic arch: Standard branching. Imaged portion shows no evidence of aneurysm or dissection. No significant stenosis of the major arch vessel origins. Right carotid system: Minor calcific atheromatous change. No evidence of dissection, stenosis (50% or greater) or occlusion. Left carotid system: No significant atheromatous disease.  No evidence  of dissection, stenosis (50% or greater) or occlusion. Vertebral arteries: LEFT vertebral dominant. No evidence of dissection, stenosis (50% or greater) or occlusion. CTA HEAD Anterior circulation: Skull base internal carotid arteries widely patent. No signs of large vessel occlusion. No significant anterior circulation stenosis, proximal occlusion, aneurysm, or vascular malformation. Posterior circulation: Both vertebrals are patent, and contribute to the basilar, LEFT dominant. No significant stenosis, proximal occlusion, aneurysm, or vascular malformation. Venous sinuses: As permitted by contrast timing, patent. Anatomic variants: None of significance. Delayed phase: No abnormal intracranial enhancement. BILATERAL cortical hypodensities are developing, correlating with the observed areas of restricted diffusion on MR. IMPRESSION: No intracranial or extracranial stenosis or occlusion. BILATERAL cortical hypodensities are developing, without associated abnormal intracranial enhancement. In the appropriate clinical setting, these could represent watershed infarcts. Electronically Signed: By: Staci Righter M.D. On: 11/22/2017 09:37   Mr Brain Wo Contrast  Result Date: 11/21/2017 CLINICAL DATA:  Encephalopathy. Recent lumbar spine surgery, respiratory arrest, and diabetic ketoacidosis. Persistent altered mental status after extubation. EXAM: MRI HEAD WITHOUT CONTRAST TECHNIQUE: Multiplanar, multiecho pulse sequences of the brain and surrounding structures were obtained without intravenous contrast. COMPARISON:  Head CT 11/21/2017 and MRI 01/02/2015 FINDINGS: Multiple sequences are mildly to moderately motion degraded. Brain: There are several small acute cortical/subcortical infarcts in the high posterior frontal lobes bilaterally. No intracranial hemorrhage, mass, midline shift, or extra-axial fluid collection is identified. There is mild cerebral atrophy. No significant chronic white matter disease  is seen. Vascular: Major intracranial vascular flow voids are preserved. Skull and upper cervical spine: Unremarkable bone marrow signal. Sinuses/Orbits: Bilateral cataract extraction. Mild mucosal thickening throughout the paranasal sinuses. Moderate bilateral mastoid effusions. Other: None. IMPRESSION: Small acute bilateral posterior frontal lobe infarcts. Electronically Signed   By: Logan Bores M.D.   On: 11/21/2017 17:28     Scheduled Meds: . aspirin  325 mg Oral Daily   Or  . aspirin  300 mg Rectal Daily  . chlorhexidine gluconate (MEDLINE KIT)  15 mL Mouth Rinse BID  . insulin aspart  0-20 Units Subcutaneous Q4H  . insulin glargine  20 Units Subcutaneous Daily  . levothyroxine  75 mcg Intravenous Daily  . mouth rinse  15 mL Mouth Rinse QID  . pantoprazole  40 mg Oral QHS  . sodium chloride flush  3 mL Intravenous Q12H   Continuous Infusions: . sodium chloride 10 mL/hr at 11/23/17 0700  . sodium chloride    . sodium chloride    . acetaminophen Stopped (11/23/17 0113)  .  ceFAZolin (ANCEF) IV 1 g (11/23/17 0559)  . heparin 1,500 Units/hr (11/23/17 0700)       Time spent: 35 minutes    Marzetta Board, MD, PhD [facility] Pager [phone]  If 7PM-7AM, please contact night-coverage [email or web address] Password [facility] 11/23/2017, 10:33 AM

## 2017-11-23 NOTE — Progress Notes (Signed)
[place] for Heparin Indication: DVT  Allergies  Allergen Reactions  . Codeine Nausea And Vomiting  . Ambien [Zolpidem Tartrate] Other (See Comments)    Up sleep walking and eating  . Crestor [Rosuvastatin] Other (See Comments)    Muscle weakness, cramps and aching all over body  . Lipitor [Atorvastatin] Itching and Other (See Comments)    Muscle weakness, cramps and aches all over body  . Morphine And Related Other (See Comments)    Flushing and feeling hot Tolerates with Diphenhydramine  . Penicillins Rash and Other (See Comments)    Caused Headaches also Has patient had a PCN reaction causing immediate rash, facial/tongue/throat swelling, SOB or lightheadedness with hypotension: No Has patient had a PCN reaction causing severe rash involving mucus membranes or skin necrosis: No Has patient had a PCN reaction that required hospitalization No Has patient had a PCN reaction occurring within the last 10 years: No If all of the above answers are "NO", then may proceed with Cephalosporin use.    . Statins Other (See Comments)    Muscle weakness, cramps and aching all over body  . Dilaudid [Hydromorphone Hcl] Other (See Comments)    Hallucinations/ argumentative, goes beserk  . Amitriptyline Other (See Comments)    Loopy feeling  . Ceftin [Cefuroxime Axetil] Other (See Comments)    Stomach pain   . Lovaza [Omega-3-Acid Ethyl Esters] Other (See Comments)    Stomach pain   . Lunesta [Eszopiclone] Other (See Comments)    Causes dizziness  . Lyrica [Pregabalin] Nausea Only  . Metformin And Related Nausea Only    Pt denies this intolerance    Patient Measurements: Height: 5\' 3"  (160 cm) Weight: 204 lb 5.9 oz (92.7 kg) IBW/kg (Calculated) : 52.4 Heparin Dosing Weight: 73 kg  Vital Signs: Temp: 97.8 F (36.6 C) (04/17 1554) Temp Source: Oral (04/17 1554) BP: 134/56 (04/17 1900) Pulse Rate: 81 (04/17 1900)  Labs: Recent Labs   11/21/17 0515  11/22/17 0650 11/22/17 0800 11/22/17 1439 11/23/17 0258  HGB 8.6*  --  8.8*  --   --  8.6*  HCT 29.4*  --  29.9*  --   --  30.2*  PLT 102*  --  82*  --   --  67*  LABPROT  --   --   --   --   --  14.0  INR  --   --   --   --   --  1.09  HEPARINUNFRC  --   --   --   --   --  <0.10*  CREATININE 0.84   < >  --  0.74 0.94 0.91   < > = values in this interval not displayed.    Estimated Creatinine Clearance: 64.9 mL/min (by C-G formula based on SCr of 0.91 mg/dL).   Medical History: Past Medical History:  Diagnosis Date  . Anemia    iron deficiency  . Arthritis   . Blood transfusion   . Cervical spondylosis with myelopathy and radiculopathy   . Depression   . Diabetes mellitus    Type II  . Diastolic dysfunction   . Diverticulitis   . DJD (degenerative joint disease)   . DVT (deep venous thrombosis) (HCC)    times 2 lower leg  . Endometriosis   . Family history of adverse reaction to anesthesia    mom and dad PONV  . Fibromyalgia   . GERD (gastroesophageal reflux disease)  occ  . Hiatal hernia   . History of kidney stones   . Hyperlipidemia   . Hypertension   . Hypothyroidism   . Insomnia   . Mild aortic insufficiency    by echo 04/2013  . Osteoarthritis    back and knee  . Ovarian cyst   . PONV (postoperative nausea and vomiting)   . RLS (restless legs syndrome)   . Thoracic compression fracture ([place])   . Uterine fibroid   . UTI (lower urinary tract infection)   . Villous adenoma of right colon 04/08/2016  . Vitamin D deficiency disease     Assessment: 67 year old female to begin heparin for DVT, recent ischemic CVA Initial heparin level <0.1 units/ml  Goal of Therapy:  Heparin level 0.3-0.7 units/ml Monitor platelets by anticoagulation protocol: Yes   Plan:  Increase heparin to 1500 units / hr 6 hour heparin level Daily heparin level, CBC  Thank you  Excell Seltzer, PharmD 11/23/2017,3:52 AM

## 2017-11-23 NOTE — Progress Notes (Signed)
[place] Progress Note Patient Name: [name] DOB: Aug 01, 1951 MRN: [id number]   Date of Service  11/23/2017  HPI/Events of Note  Hypokalemia  eICU Interventions  KCL 10 meq iv q Hr x 2 then recheck K+     Intervention Category Intermediate Interventions: Electrolyte abnormality - evaluation and management  Frederik Pear 11/23/2017, 6:36 AM

## 2017-11-23 NOTE — CV Procedure (Signed)
   Transesophageal Echocardiogram  Indications: [place]  Time out performed Anesthesia provided propofol sedation  Findings:  Left Ventricle: Normal ejection fraction 65% with no wall motion abnormalities  Mitral Valve: Mild mitral regurgitation  Aortic Valve: Mild aortic regurgitation  Tricuspid Valve: Mild tricuspid regurgitation  Left Atrium: No left atrial appendage thrombus  Bubble Contrast Study: Negative bubble study  Candee Furbish, MD

## 2017-11-23 NOTE — Plan of Care (Signed)
  Problem: Education: Goal: Knowledge of disease or condition will improve Outcome: Progressing   

## 2017-11-24 ENCOUNTER — Encounter (HOSPITAL_COMMUNITY): Payer: Self-pay | Admitting: Cardiology

## 2017-11-24 LAB — GLUCOSE, CAPILLARY
GLUCOSE-CAPILLARY: 179 mg/dL — AB (ref 65–99)
GLUCOSE-CAPILLARY: 292 mg/dL — AB (ref 65–99)
GLUCOSE-CAPILLARY: 72 mg/dL (ref 65–99)
Glucose-Capillary: 60 mg/dL — ABNORMAL LOW (ref 65–99)
Glucose-Capillary: 133 mg/dL — ABNORMAL HIGH (ref 65–99)
Glucose-Capillary: 284 mg/dL — ABNORMAL HIGH (ref 65–99)
Glucose-Capillary: 237 mg/dL — ABNORMAL HIGH (ref 65–99)

## 2017-11-24 LAB — BASIC METABOLIC PANEL
Anion gap: 9 (ref 5–15)
BUN: 6 mg/dL (ref 6–20)
CO2: 25 mmol/L (ref 22–32)
CREATININE: 0.87 mg/dL (ref 0.44–1.00)
Calcium: 8.3 mg/dL — ABNORMAL LOW (ref 8.9–10.3)
Chloride: 106 mmol/L (ref 101–111)
GFR calc Af Amer: >60 mL/min (ref >60)
GFR calc non Af Amer: >60 mL/min (ref >60)
Glucose, Bld: 78 mg/dL (ref 65–99)
Potassium: 3 mmol/L — ABNORMAL LOW (ref 3.5–5.1)
Sodium: 140 mmol/L (ref 135–145)

## 2017-11-24 LAB — HEPARIN INDUCED PLATELET AB (HIT ANTIBODY): Heparin Induced Plt Ab: 0.609 OD — ABNORMAL HIGH (ref 0.000–0.400)

## 2017-11-24 LAB — CBC
HEMATOCRIT: 32.5 % — AB (ref 36.0–46.0)
HEMOGLOBIN: 9.6 g/dL — AB (ref 12.0–15.0)
MCH: 20.4 pg — AB (ref 26.0–34.0)
MCHC: 29.5 g/dL — AB (ref 30.0–36.0)
MCV: 69 fL — ABNORMAL LOW (ref 78.0–100.0)
Platelets: 120 10*3/uL — ABNORMAL LOW (ref 150–400)
RBC: 4.71 MIL/uL (ref 3.87–5.11)
RDW: 21.7 % — ABNORMAL HIGH (ref 11.5–15.5)
WBC: 9.2 10*3/uL (ref 4.0–10.5)

## 2017-11-24 LAB — CULTURE, BLOOD (ROUTINE X 2)
CULTURE: NO GROWTH
SPECIAL REQUESTS: ADEQUATE

## 2017-11-24 MED ORDER — LEVOTHYROXINE SODIUM 75 MCG PO TABS
150.0000 ug | ORAL_TABLET | Freq: Every day | ORAL | Status: DC
  Administered 2017-11-25: 150 ug via ORAL
  Filled 2017-11-24: qty 2

## 2017-11-24 MED ORDER — ONDANSETRON 4 MG PO TBDP
4.0000 mg | ORAL_TABLET | Freq: Three times a day (TID) | ORAL | Status: DC | PRN
  Administered 2017-11-24: 4 mg via ORAL
  Filled 2017-11-24 (×2): qty 1

## 2017-11-24 MED ORDER — TEMAZEPAM 15 MG PO CAPS
15.0000 mg | ORAL_CAPSULE | Freq: Once | ORAL | Status: AC
  Administered 2017-11-24: 15 mg via ORAL
  Filled 2017-11-24: qty 1

## 2017-11-24 MED ORDER — POTASSIUM CHLORIDE CRYS ER 20 MEQ PO TBCR
40.0000 meq | EXTENDED_RELEASE_TABLET | Freq: Two times a day (BID) | ORAL | Status: AC
  Administered 2017-11-24 (×2): 40 meq via ORAL
  Filled 2017-11-24 (×3): qty 2

## 2017-11-24 MED ORDER — POTASSIUM CHLORIDE 10 MEQ/50ML IV SOLN
10.0000 meq | INTRAVENOUS | Status: DC
  Administered 2017-11-24: 10 meq via INTRAVENOUS
  Filled 2017-11-24: qty 50

## 2017-11-24 NOTE — Progress Notes (Signed)
Occupational Therapy Treatment Patient Details Name: [name] MRN: [id number] DOB: 1951/07/14 Today's Date: 11/24/2017    History of present illness Pt is a 67 y.o. female admitted to [facility] on 4/13 with DKA and in respiratory arrest; transferred to [facility] on mechanical ventilation, placed on pressors. Extubated 4/16. MRI +B posterior frontal lobe infarcts. DVT noted in the L soleal vein 4/17. PMH includes back L2-4 PLIF on 4/11, B shoulder surgeries, B TKA, remote back sx, DM, hypothyroid, CHF.    OT comments  Focus of session on educating pt in back precautions related to ADL and IADL. Pt with difficulty stating precautions despite maximum verbal cues, but verbalized generalization during IADL. Pt with improved ability to perform bed mobility using log roll technique. Unable to cross foot over opposite knee for LB bathing and dressing. Educated pt in benefits of long handled bath sponge and reacher. Pt verbalizing understanding. Pt plans to avoid socks and wear slip on shoes. Pt with nausea while sitting EOB prior to practicing donning back brace, requested return to supine. RN notified. VSS. Updated d/c plan to [place]. Will continue to follow acutely.  Follow Up Recommendations  Home health OT    Equipment Recommendations  None recommended by OT    Recommendations for Other Services      Precautions / Restrictions Precautions Precautions: Back;Fall Precaution Booklet Issued: Yes (comment) Precaution Comments: pt needing maximum verbal cues to recall back precautions Required Braces or Orthoses: Spinal Brace Spinal Brace: Lumbar corset;Applied in sitting position Restrictions Weight Bearing Restrictions: No       Mobility Bed Mobility Overal bed mobility: Needs Assistance Bed Mobility: Rolling;Sidelying to Sit;Sit to Sidelying Rolling: Supervision Sidelying to sit: Min guard     Sit to sidelying: Min guard General bed mobility comments: pt utilizing log roll technique, HOB flat,  cues to avoid use of rail to simulate home set up, increased time and effort  Transfers          General transfer comment: deferred due to nausea    Balance Overall balance assessment: Needs assistance Sitting-balance support: No upper extremity supported;Feet supported Sitting balance-Leahy Scale: Fair                                ADL either performed or assessed with clinical judgement   ADL Overall ADL's : Needs assistance/impaired               Lower Body Bathing Details (indicate cue type and reason): pt educated in benefits of long handled bath sponge to wash feet and reacher to dry     Lower Body Dressing: Total assistance;Sitting/lateral leans Lower Body Dressing Details (indicate cue type and reason): pt reports she does not plan to wear socks, plans to wear slip on shoes, recommended reacher to start pants over feet               General ADL Comments: Pt with nausea while seated EOB, requesting to return to supine. Cold cloth, fan and ice chips provided, RN notified. VSS     Vision   Additional Comments: Educated pt in benefits of rollator at home for transporting items ie: Stage manager. Pt verbalizing understanding of limitations related to IADL due to back precautions.   Perception     Praxis      Cognition Arousal/Alertness: Awake/alert Behavior During Therapy: WFL for tasks assessed/performed Overall Cognitive Status: Impaired/Different from baseline Area of Impairment: Memory  Memory: Decreased recall of precautions                  Exercises     Shoulder Instructions       General Comments VSS on RA with activity.     Pertinent Vitals/ Pain       Pain Assessment: Faces Faces Pain Scale: No hurt  Home Living                                          Prior Functioning/Environment              Frequency  Min 2X/week        Progress Toward Goals  OT  Goals(current goals can now be found in the care plan section)  Progress towards OT goals: Progressing toward goals  Acute Rehab OT Goals Patient Stated Goal: to get home and have a tea party with her granddaughter OT Goal Formulation: With patient Time For Goal Achievement: 11/30/17 Potential to Achieve Goals: Good  Plan Discharge plan needs to be updated    Co-evaluation                 AM-PAC PT "6 Clicks" Daily Activity     Outcome Measure   Help from another person eating meals?: None Help from another person taking care of personal grooming?: A Little Help from another person toileting, which includes using toliet, bedpan, or urinal?: A Little Help from another person bathing (including washing, rinsing, drying)?: A Lot Help from another person to put on and taking off regular upper body clothing?: A Little Help from another person to put on and taking off regular lower body clothing?: A Lot 6 Click Score: 17    End of Session Equipment Utilized During Treatment: Other (comment)(pt with nausea with sitting EOB prior to donning brace)  OT Visit Diagnosis: Unsteadiness on feet (R26.81)   Activity Tolerance Treatment limited secondary to medical complications (Comment)(nausea)   Patient Left in bed;with call bell/phone within reach;with family/visitor present   Nurse Communication Other (comment)(nausea, requesting medication)        Time: 8333-8329 OT Time Calculation (min): 25 min  Charges: OT General Charges $OT Visit: 1 Visit OT Treatments $Self Care/Home Management : 23-37 mins  11/24/2017 Nestor Lewandowsky, OTR/L Pager: [place], Haze Boyden 11/24/2017, 2:08 PM

## 2017-11-24 NOTE — Progress Notes (Signed)
Physical Therapy Treatment Patient Details Name: [name] MRN: [id number] DOB: July 07, 1951 Today's Date: 11/24/2017    History of Present Illness Pt is a 67 y.o. female admitted to [facility] on 4/13 with DKA and in respiratory arrest; transferred to [facility] on mechanical ventilation, placed on pressors. Extubated 4/16. MRI +B posterior frontal lobe infarcts. DVT noted in the L soleal vein 4/17. PMH includes back L2-4 PLIF on 4/11, B shoulder surgeries, B TKA, remote back sx, DM, hypothyroid, CHF.     PT Comments    Pt admitted with above diagnosis. Pt currently with functional limitations due to balance and endurance deficits. Pt was able to ambulate with RW in hallways with guarded gait and min assist as she is slightly unsteady.  Will continue PT and progress pt as able.  Pt is weak compared to baseline.  Pt will benefit from skilled PT to increase their independence and safety with mobility to allow discharge to the venue listed below.     Follow Up Recommendations  Supervision for mobility/OOB;Home health PT     Equipment Recommendations  None recommended by PT    Recommendations for Other Services       Precautions / Restrictions Precautions Precautions: Back;Fall Precaution Booklet Issued: Yes (comment) Precaution Comments: pt able to state 2/3 back precautions at beginning of session Required Braces or Orthoses: Spinal Brace Spinal Brace: Lumbar corset;Applied in sitting position Restrictions Weight Bearing Restrictions: No    Mobility  Bed Mobility Overal bed mobility: Needs Assistance Bed Mobility: Rolling;Sidelying to Sit;Sit to Sidelying Rolling: Supervision Sidelying to sit: Min guard     Sit to sidelying: Min guard General bed mobility comments: pt utilizing log roll technique  Transfers Overall transfer level: Needs assistance Equipment used: Rolling walker (2 wheeled) Transfers: Sit to/from Stand Sit to Stand: Min assist         General transfer comment:  steadying assist, pt guarded  Ambulation/Gait Ambulation/Gait assistance: Min guard;Min assist Ambulation Distance (Feet): 125 Feet Assistive device: Rolling walker (2 wheeled) Gait Pattern/deviations: Step-through pattern;Decreased stride length;Antalgic;Drifts right/left Gait velocity: Decreased Gait velocity interpretation: <1.8 ft/sec, indicate of risk for recurrent falls General Gait Details: Pt lost balance after taking a few steps needing min assist to balance.  No other LOB but pt very guarded. Occasional assist given to steer RW.    Stairs             Wheelchair Mobility    Modified Rankin (Stroke Patients Only)       Balance Overall balance assessment: Needs assistance Sitting-balance support: No upper extremity supported;Feet supported Sitting balance-Leahy Scale: Fair     Standing balance support: During functional activity;Bilateral upper extremity supported Standing balance-Leahy Scale: Poor Standing balance comment: relies on UE support for balance currently due to weakness.                             Cognition Arousal/Alertness: Awake/alert Behavior During Therapy: WFL for tasks assessed/performed Overall Cognitive Status: Within Functional Limits for tasks assessed                                        Exercises      General Comments General comments (skin integrity, edema, etc.): VSS on RA with activity.       Pertinent Vitals/Pain Pain Assessment: No/denies pain    Home Living  Prior Function            PT Goals (current goals can now be found in the care plan section) Progress towards PT goals: Progressing toward goals    Frequency    Min 3X/week      PT Plan Discharge plan needs to be updated    Co-evaluation              AM-PAC PT "6 Clicks" Daily Activity  Outcome Measure  Difficulty turning over in bed (including adjusting bedclothes, sheets and  blankets)?: None Difficulty moving from lying on back to sitting on the side of the bed? : None Difficulty sitting down on and standing up from a chair with arms (e.g., wheelchair, bedside commode, etc,.)?: A Little Help needed moving to and from a bed to chair (including a wheelchair)?: A Little Help needed walking in hospital room?: A Little Help needed climbing 3-5 steps with a railing? : A Little 6 Click Score: 20    End of Session Equipment Utilized During Treatment: Back brace Activity Tolerance: Patient tolerated treatment well Patient left: in bed;with call bell/phone within reach Nurse Communication: Mobility status PT Visit Diagnosis: Other abnormalities of gait and mobility (R26.89);Unsteadiness on feet (R26.81)     Time: 1308-6578 PT Time Calculation (min) (ACUTE ONLY): 35 min  Charges:  $Gait Training: 23-37 mins                    G Codes:       Courtni Balash,PT Acute Rehabilitation [phone] (pager)    Berline Lopes 11/24/2017, 12:31 PM

## 2017-11-24 NOTE — Anesthesia Postprocedure Evaluation (Signed)
Anesthesia Post Note  Patient: [name]  Procedure(s) Performed: TRANSESOPHAGEAL ECHOCARDIOGRAM (TEE) (N/A )     Patient location during evaluation: PACU Anesthesia Type: MAC Level of consciousness: awake and alert Pain management: pain level controlled Vital Signs Assessment: post-procedure vital signs reviewed and stable Respiratory status: spontaneous breathing, nonlabored ventilation, respiratory function stable and patient connected to nasal cannula oxygen Cardiovascular status: stable and blood pressure returned to baseline Postop Assessment: no apparent nausea or vomiting Anesthetic complications: no    Last Vitals:  Vitals:   11/24/17 1000 11/24/17 1100  BP: (!) 149/74   Pulse: 98 96  Resp: 13 (!) 25  Temp:    SpO2: 98% 99%    Last Pain:  Vitals:   11/24/17 0800  TempSrc:   PainSc: 0-No pain                 Hevin Jeffcoat EDWARD

## 2017-11-24 NOTE — Progress Notes (Signed)
Speech Language Pathology Treatment: Dysphagia  Patient Details Name: [name] MRN: [id number] DOB: [birth year] Today's Date: 11/24/2017 Time: 4098-1191 SLP Time Calculation (min) (ACUTE ONLY): 22 min  Assessment / Plan / Recommendation Clinical Impression  Pt observed with regular, thin, and puree trials without overt signs/symptoms concerning for aspiration or oral phase deficits. SLP provided education on aspiration precautions, which pt followed independently.  Pt reports no coughing with current diet textures, only at night when laying flat. Therefore recommend pt start a regular solids and thin liquid diet with aspiration precautions, whole medications with thin liquid, and intermittent supervision. SLP will follow briefly to ensure compliance with precautions and safety with diet.   Throughout skilled observation, pt appears to be at her cognitive-lingusitic baseline and she denies subjective complaints; therefore, speech language evaluation and follow up not warranted at this time.       HPI HPI: 67 year old woman with brittle diabetes, hypertension with diastolic CHF, hypothyroidism, GERD, chronic pain syndrome on narcotics.  She was admitted with DKA and associated profound metabolic acidosis, acute renal failure. CT (4/16) revealed New small hypodensity posterosuperior right frontal lobe possibly small acute infarct. MRI (4/16) revealed small acute bilateral posterior frontal lobe infarcts. Pt was intubated 4/14-4/15. No hx with SLP. CXR (4/15) revealed that both lungs are clear.      SLP Plan  Continue with current plan of care       Recommendations  Diet recommendations: Regular;Thin liquid Liquids provided via: Straw;Cup Medication Administration: Whole meds with liquid Supervision: Patient able to self feed;Intermittent supervision to cue for compensatory strategies Compensations: Slow rate;Small sips/bites Postural Changes and/or Swallow Maneuvers: Seated upright 90  degrees                Oral Care Recommendations: Oral care BID Follow up Recommendations: None SLP Visit Diagnosis: Dysphagia, unspecified (R13.10) Plan: Continue with current plan of care       GO              [name] SLP Student Clinician  [place] Alece Koppel 11/24/2017, 9:48 AM

## 2017-11-24 NOTE — Care Management Note (Signed)
Case Management Note  Patient Details  Name: [name] MRN: [id number] Date of Birth: 16-Mar-1951  Subjective/Objective: Pt admitted in DKA                   Action/Plan:   PTA independent from home with husband.  Pt states she has PCP and denied barriers with obtaining/paying for medications as prescribed.  CM will continue to follow for discharge needs   Expected Discharge Date:                  Expected Discharge Plan:  Home/Self Care  In-House Referral:     Discharge planning Services  CM Consult  Post Acute Care Choice:    Choice offered to:  Patient  DME Arranged:    DME Agency:     HH Arranged:  PT [place]:  [place]  Status of Service:  In process, will continue to follow  If discussed at Long Length of Stay Meetings, dates discussed:    Additional Comments: 11/24/2017 CM offered choice for [facility] - pt chose [facility] - agency contacted and informed of potential referral - CM requested [place] orders Maryclare Labrador, RN 11/24/2017, 1:45 PM

## 2017-11-24 NOTE — Progress Notes (Signed)
PROGRESS NOTE  [name]  FHQ:[id number] DOB: 05-30-1951 DOA: 11/18/2017 PCP: Harlan Stains, MD   Brief Narrative: 67 yo F with IDDM, hypothyroidism, G1DD (EF 65-70%), who recently had L spine surgery on 4/12, was taken emergently to the ER for DKA, metabolic acidosis and respiratory arrest en route, she was intubated and admitted to the ICU. She was started on pressors, empiric antibiotics, insulin. She was extubated 4/15. DKA resolved though she continued to be encephalopathic and neurology was consulted. Neuroimaging revealed frontal infarcts with negative work up for secondary/embolic causes. Encephalopathy has improved, though pt remains very weak. Ancef has been given for Klebsiella PNA.   Assessment & Plan: Active Problems:   DKA (diabetic ketoacidoses) (HCC)   Diabetic ketoacidosis (HCC)   Acute respiratory failure with hypoxia (HCC)   Acute renal failure (HCC)   Endotracheal tube present   Septic shock (HCC)   Cerebral thrombosis with cerebral infarction   Acute encephalopathy  Acute encephalopathy: Likely multifactorial in the setting of DKA, PNA, ICU admission/respiratory failure, and new CVA. EEG done on 4/17, without epileptiform discharges - Improving slowly. Oriented.  - Transfer today, continue PT/OT  Acute CVA: MRI of the brain done on 4/16 showed small acute bilateral posterior frontal lobe infarcts, concern for watershed versus embolic. ?Marrow embolism s/p spinal surgery. TEE without cardioembolic source, negative bubble study. A1c 7.4, LDL 35. CT angiogram without any significant intracranial or extracranial stenosis or occlusion - On xarelto as below.  - Advance diet - Continue PT/OT  Acute hypoxic respiratory failure: Resolved. In the setting of acute encephalopathy, patient extubated 4/15.  Hypertension - Permissive hypertension for acute stroke  Troponin elevation - No chest pain or ACS type symptoms, likely demand. No further evaluation  planned.  Acute kidney injury: In the setting of hypotension/shock, now resolved  Hypokalemia:  - Replace and recheck  Hypothyroidism: TSH 0.265.  - Recheck TSH outside scope of illness - Continue synthroid (change to po)  Klebsiella pneumonia: With leukocytosis, CT scan did show upper lobe opacity, has been intubated and tracheal aspirate is positive for Klebsiella - Continue ancef for total of 5 days (final dose 4/20)  DKA: Resolved. Possibly triggered by surgery.  - DC CVL  T2DM:  - Continue basal insulin and sliding scale  DVT: Ultrasound done on 4/17 showed DVT in the left soleal vein. - Initially on heparin, now xarelto as below.  Thrombocytopenia: Worsening in setting of heparin subQ and then infusion. Not typical of HIT (discussed with Dr. Benay Spice by phone 4/18).  - Plt's improved 4/19.  - HIT panel pending - Monitor for bleeding.   DVT prophylaxis: Xarelto Code Status: Full Family Communication: None at bedside this AM Disposition Plan: Transfer to telemetry, possible DC in next 24-48 hours once deemed safe for DC.   LINES / TUBES: LIJ CVC 4/13>>> Foley catheter 4/16>>>  CULTURES: 4/13 Blood Cx: Staph epi 1 out of 2 4/14 Resp QD:IYMEBRAXEN pneumoniae  ANTIBIOTICS: Cefepime 4/13-4/14 Ancef 4/16>>  Subjective: Feels "weak as a dish rag." Couldn't hold a cup yesterday. Pain in back is controlled. Otherwise, feeling no dyspnea, chest pain, palpitations, or other concerns.   Objective: Vitals:   11/24/17 0430 11/24/17 0500 11/24/17 0600 11/24/17 0715  BP:  (!) 150/74 (!) 153/73   Pulse:  85 81   Resp:  20 (!) 21   Temp: 98.8 F (37.1 C)   98.5 F (36.9 C)  TempSrc: Oral   Oral  SpO2:  98% 99%   Weight:  Height:        Intake/Output Summary (Last 24 hours) at 11/24/2017 0935 Last data filed at 11/24/2017 0600 Gross per 24 hour  Intake 520 ml  Output 3350 ml  Net -2830 ml   Filed Weights   11/21/17 0500 11/22/17 0500 11/24/17 0404   Weight: 94 kg (207 lb 3.7 oz) 92.7 kg (204 lb 5.9 oz) 89.8 kg (197 lb 15.6 oz)    Gen: 67 y.o. female in no distress Pulm: Non-labored breathing room air. Clear to auscultation bilaterally.  CV: Regular rate and rhythm. No murmur, rub, or gallop. No JVD, no pedal edema. GI: Abdomen soft, non-tender, non-distended, with normoactive bowel sounds. No organomegaly or masses felt. Ext: Warm, no deformities Skin: No rashes, lesions or ulcers Neuro: Alert and oriented. No focal neurological deficits. Psych: Judgement and insight appear normal. Mood & affect appropriate.   Data Reviewed: I have personally reviewed following labs and imaging studies  CBC: Recent Labs  Lab 11/18/17 1528 11/18/17 2105  11/20/17 0429 11/21/17 0515 11/22/17 0650 11/23/17 0258 11/24/17 0457  WBC 29.3* 36.1*   < > 6.3 7.4 8.3 6.9 9.2  NEUTROABS 24.6* 33.2*  --   --   --   --  5.2  --   HGB 6.8* 6.8*   < > 7.8* 8.6* 8.8* 8.6* 9.6*  HCT 27.2* 26.6*   < > 26.0* 29.4* 29.9* 30.2* 32.5*  MCV 73.5* 69.5*   < > 67.4* 68.7* 69.4* 69.6* 69.0*  PLT 708* 572*   < > 146* 102* 82* 67* 120*   < > = values in this interval not displayed.   Basic Metabolic Panel: Recent Labs  Lab 11/18/17 1528 11/18/17 2105  11/19/17 1019  11/21/17 1709 11/22/17 0800 11/22/17 1439 11/23/17 0258 11/23/17 1000 11/24/17 0457  NA 133* 136   < > 146*   < > 146* 139 141 138  --  140  K 6.9* 5.3*   < > 4.0   < > 3.2* 5.8* 3.3* 3.2* 3.1* 3.0*  CL 100* 106   < > 115*   < > 112* 108 107 105  --  106  CO2 4* <7*   < > 20*   < > '26 24 24 23  ' --  25  GLUCOSE 741* 605*   < > 155*   < > 134* 178* 235* 222*  --  78  BUN 54* 57*   < > 43*   < > '13 12 11 10  ' --  6  CREATININE 2.99* 3.62*   < > 2.00*   < > 0.77 0.74 0.94 0.91  --  0.87  CALCIUM 8.3* 7.9*   < > 7.9*   < > 8.3* 7.8* 7.9* 7.9*  --  8.3*  MG 3.1* 2.6*  --   --   --   --   --   --   --   --   --   PHOS  --  7.3*  --  2.6  --   --   --   --   --   --   --    < > = values in this  interval not displayed.   GFR: Estimated Creatinine Clearance: 66.8 mL/min (by C-G formula based on SCr of 0.87 mg/dL). Liver Function Tests: Recent Labs  Lab 11/18/17 1528 11/18/17 2105 11/21/17 0515  AST 24 47* 26  ALT '18 28 30  ' ALKPHOS 104 109 80  BILITOT 1.9* 1.8* 0.7  PROT 6.0*  5.6* 5.3*  ALBUMIN 3.0* 2.9* 2.5*   Recent Labs  Lab 11/21/17 1000  LIPASE 35   Recent Labs  Lab 11/18/17 1609 11/19/17 0526 11/20/17 0429  AMMONIA 103* 18 18   Coagulation Profile: Recent Labs  Lab 11/18/17 1528 11/23/17 0258  INR 1.57 1.09   Cardiac Enzymes: Recent Labs  Lab 11/18/17 1528 11/18/17 2105 11/19/17 0417 11/19/17 1019 11/19/17 1617  TROPONINI 0.32* 1.18* 3.10* 3.20* 2.41*   BNP (last 3 results) No results for input(s): PROBNP in the last 8760 hours. HbA1C: Recent Labs    11/22/17 1439  HGBA1C 7.4*   CBG: Recent Labs  Lab 11/23/17 2031 11/23/17 2351 11/24/17 0429 11/24/17 0516 11/24/17 0707  GLUCAP 286* 292* 60* 72 133*   Lipid Profile: Recent Labs    11/22/17 0800  CHOL 92  HDL 25*  LDLCALC 35  TRIG 159*  CHOLHDL 3.7   Thyroid Function Tests: No results for input(s): TSH, T4TOTAL, FREET4, T3FREE, THYROIDAB in the last 72 hours. Anemia Panel: No results for input(s): VITAMINB12, FOLATE, FERRITIN, TIBC, IRON, RETICCTPCT in the last 72 hours. Urine analysis:    Component Value Date/Time   COLORURINE YELLOW 11/18/2017 1520   APPEARANCEUR CLEAR 11/18/2017 1520   LABSPEC 1.018 11/18/2017 1520   PHURINE 5.0 11/18/2017 1520   GLUCOSEU >=500 (A) 11/18/2017 1520   HGBUR SMALL (A) 11/18/2017 1520   BILIRUBINUR NEGATIVE 11/18/2017 1520   KETONESUR 80 (A) 11/18/2017 1520   PROTEINUR NEGATIVE 11/18/2017 1520   UROBILINOGEN 0.2 07/30/2012 2228   NITRITE NEGATIVE 11/18/2017 1520   LEUKOCYTESUR NEGATIVE 11/18/2017 1520   Recent Results (from the past 240 hour(s))  MRSA PCR Screening     Status: None   Collection Time: 11/18/17  8:25 PM  Result  Value Ref Range Status   MRSA by PCR NEGATIVE NEGATIVE Final    Comment:        The GeneXpert MRSA Assay (FDA approved for NASAL specimens only), is one component of a comprehensive MRSA colonization surveillance program. It is not intended to diagnose MRSA infection nor to guide or monitor treatment for MRSA infections. Performed at [facility], [place] [address], [place], [place] [address]   Culture, blood (routine x 2)     Status: Abnormal   Collection Time: 11/18/17  9:02 PM  Result Value Ref Range Status   Specimen Description BLOOD CENTRAL LINE  Final   Special Requests   Final    BOTTLES DRAWN AEROBIC AND ANAEROBIC Blood Culture adequate volume   Culture  Setup Time   Final    GRAM POSITIVE COCCI IN CLUSTERS ANAEROBIC BOTTLE ONLY Organism ID to follow CRITICAL RESULT CALLED TO, READ BACK BY AND VERIFIED WITH: Leonie Green PharmD 9:55 11/20/17 ([email or web address])    Culture (A)  Final    STAPHYLOCOCCUS SPECIES (COAGULASE NEGATIVE) THE SIGNIFICANCE OF ISOLATING THIS ORGANISM FROM A SINGLE SET OF BLOOD CULTURES WHEN MULTIPLE SETS ARE DRAWN IS UNCERTAIN. PLEASE NOTIFY THE MICROBIOLOGY DEPARTMENT WITHIN ONE WEEK IF SPECIATION AND SENSITIVITIES ARE REQUIRED. Performed at [facility], [place] [address] [place], [place] [address]    Report Status 11/22/2017 FINAL  Final  Blood Culture ID Panel (Reflexed)     Status: Abnormal   Collection Time: 11/18/17  9:02 PM  Result Value Ref Range Status   Enterococcus species NOT DETECTED NOT DETECTED Final   Listeria monocytogenes NOT DETECTED NOT DETECTED Final   Staphylococcus species DETECTED (A) NOT DETECTED Final    Comment: Methicillin (oxacillin) susceptible  coagulase negative staphylococcus. Possible blood culture contaminant (unless isolated from more than one blood culture draw or clinical case suggests pathogenicity). No antibiotic treatment is indicated for blood  culture contaminants. CRITICAL RESULT CALLED TO, READ BACK BY AND  VERIFIED WITH: Leonie Green PharmD 9:55 11/20/17 ([email or web address])    Staphylococcus aureus NOT DETECTED NOT DETECTED Final   Methicillin resistance NOT DETECTED NOT DETECTED Final   Streptococcus species NOT DETECTED NOT DETECTED Final   Streptococcus agalactiae NOT DETECTED NOT DETECTED Final   Streptococcus pneumoniae NOT DETECTED NOT DETECTED Final   Streptococcus pyogenes NOT DETECTED NOT DETECTED Final   Acinetobacter baumannii NOT DETECTED NOT DETECTED Final   Enterobacteriaceae species NOT DETECTED NOT DETECTED Final   Enterobacter cloacae complex NOT DETECTED NOT DETECTED Final   Escherichia coli NOT DETECTED NOT DETECTED Final   Klebsiella oxytoca NOT DETECTED NOT DETECTED Final   Klebsiella pneumoniae NOT DETECTED NOT DETECTED Final   Proteus species NOT DETECTED NOT DETECTED Final   Serratia marcescens NOT DETECTED NOT DETECTED Final   Haemophilus influenzae NOT DETECTED NOT DETECTED Final   Neisseria meningitidis NOT DETECTED NOT DETECTED Final   Pseudomonas aeruginosa NOT DETECTED NOT DETECTED Final   Candida albicans NOT DETECTED NOT DETECTED Final   Candida glabrata NOT DETECTED NOT DETECTED Final   Candida krusei NOT DETECTED NOT DETECTED Final   Candida parapsilosis NOT DETECTED NOT DETECTED Final   Candida tropicalis NOT DETECTED NOT DETECTED Final    Comment: Performed at [facility], [address] [place], [place] [address]  Culture, blood (routine x 2)     Status: None (Preliminary result)   Collection Time: 11/19/17  3:54 AM  Result Value Ref Range Status   Specimen Description BLOOD RIGHT THUMB  Final   Special Requests   Final    BOTTLES DRAWN AEROBIC AND ANAEROBIC Blood Culture adequate volume   Culture   Final    NO GROWTH 4 DAYS Performed at [facility], [place] [address] [place], [place] [address]    Report Status PENDING  Incomplete  Culture, respiratory (NON-Expectorated)     Status: None   Collection Time: 11/19/17  5:42 PM  Result Value Ref  Range Status   Specimen Description TRACHEAL ASPIRATE  Final   Special Requests NONE  Final   Gram Stain   Final    MODERATE WBC PRESENT, PREDOMINANTLY PMN RARE SQUAMOUS EPITHELIAL CELLS PRESENT ABUNDANT GRAM POSITIVE COCCI IN CLUSTERS FEW BUDDING YEAST SEEN Performed at [facility], [place] [address], [place], [place] [address]    Culture MODERATE KLEBSIELLA PNEUMONIAE  Final   Report Status 11/21/2017 FINAL  Final   Organism ID, Bacteria KLEBSIELLA PNEUMONIAE  Final      Susceptibility   Klebsiella pneumoniae - MIC*    AMPICILLIN >=32 RESISTANT Resistant     CEFAZOLIN <=4 SENSITIVE Sensitive     CEFEPIME <=1 SENSITIVE Sensitive     CEFTAZIDIME <=1 SENSITIVE Sensitive     CEFTRIAXONE <=1 SENSITIVE Sensitive     CIPROFLOXACIN <=0.25 SENSITIVE Sensitive     GENTAMICIN <=1 SENSITIVE Sensitive     IMIPENEM <=0.25 SENSITIVE Sensitive     TRIMETH/SULFA <=20 SENSITIVE Sensitive     AMPICILLIN/SULBACTAM 8 SENSITIVE Sensitive     PIP/TAZO 16 SENSITIVE Sensitive     Extended ESBL NEGATIVE Sensitive     * MODERATE KLEBSIELLA PNEUMONIAE      Radiology Studies: No results found.  Scheduled Meds: . amLODipine  5 mg Oral Daily  .  aspirin  325 mg Oral Daily   Or  . aspirin  300 mg Rectal Daily  . chlorhexidine gluconate (MEDLINE KIT)  15 mL Mouth Rinse BID  . insulin aspart  0-20 Units Subcutaneous Q4H  . insulin glargine  20 Units Subcutaneous Daily  . levothyroxine  75 mcg Intravenous Daily  . mouth rinse  15 mL Mouth Rinse QID  . pantoprazole  40 mg Oral QHS  . potassium chloride  40 mEq Oral BID  . rivaroxaban  15 mg Oral BID   Followed by  . [START ON 12/14/2017] rivaroxaban  20 mg Oral Q supper  . sodium chloride flush  3 mL Intravenous Q12H   Continuous Infusions: . sodium chloride 10 mL/hr at 11/23/17 0700  . sodium chloride    .  ceFAZolin (ANCEF) IV Stopped (11/24/17 0550)  . potassium chloride 10 mEq (11/24/17 0832)     LOS: 6 days   Time spent: 25  minutes.  Vance Gather, MD [facility] Pager [phone]  If 7PM-7AM, please contact night-coverage [email or web address] Password [email or web address] 11/24/2017, 9:35 AM

## 2017-11-24 NOTE — Progress Notes (Signed)
Inpatient Diabetes Program Recommendations  AACE/ADA: New Consensus Statement on Inpatient Glycemic Control (2015)  Target Ranges:  Prepandial:   less than 140 mg/dL      Peak postprandial:   less than 180 mg/dL (1-2 hours)      Critically ill patients:  140 - 180 mg/dL   Lab Results  Component Value Date   GLUCAP 133 (H) 11/24/2017   HGBA1C 7.4 (H) 11/22/2017    Review of Glycemic Control Results for [name], [name] (MRN [id number]) as of 11/24/2017 09:14  Ref. Range 11/23/2017 23:51 11/24/2017 04:29 11/24/2017 05:16 11/24/2017 07:07  Glucose-Capillary Latest Ref Range: 65 - 99 mg/dL 292 (H) 60 (L) 72 133 (H)   Diabetes history: Type 2 DM Outpatient Diabetes medications: Outpatient Diabetes medications: Jardiance 10 mg QD, Novolin 70/30 50 units bid Current orders for Inpatient glycemic control: Lantus 20 units QHs, Novolog 0-20 units Q4H  Inpatient Diabetes Program Recommendations:    Noted hypoglycemic episode of 60 mg/dL this AM. Would recommend decreasing correction to Novolog 0-9 unit. If diet is to advance would change correction to sensitive TID and add Novolog 0-5 units QHS. If post prandials remain >180 mg/dL could consider Novolog 4 units TID (assuming patient consumes >50%).    Thanks, Bronson Curb, MSN, RNC-OB Diabetes Coordinator [phone] (8a-5p)

## 2017-11-25 ENCOUNTER — Other Ambulatory Visit: Payer: Self-pay

## 2017-11-25 LAB — CBC
HCT: 37.6 % (ref 36.0–46.0)
Hemoglobin: 10.9 g/dL — ABNORMAL LOW (ref 12.0–15.0)
MCH: 20.4 pg — AB (ref 26.0–34.0)
MCHC: 29 g/dL — ABNORMAL LOW (ref 30.0–36.0)
MCV: 70.4 fL — AB (ref 78.0–100.0)
PLATELETS: 168 10*3/uL (ref 150–400)
RBC: 5.34 MIL/uL — ABNORMAL HIGH (ref 3.87–5.11)
RDW: 23.7 % — AB (ref 11.5–15.5)
WBC: 10.7 10*3/uL — ABNORMAL HIGH (ref 4.0–10.5)

## 2017-11-25 LAB — BASIC METABOLIC PANEL
Anion gap: 10 (ref 5–15)
BUN: 5 mg/dL — AB (ref 6–20)
CALCIUM: 9.1 mg/dL (ref 8.9–10.3)
CHLORIDE: 106 mmol/L (ref 101–111)
CO2: 24 mmol/L (ref 22–32)
CREATININE: 0.95 mg/dL (ref 0.44–1.00)
GFR calc non Af Amer: >60 mL/min (ref >60)
GLUCOSE: 127 mg/dL — AB (ref 65–99)
Potassium: 3.8 mmol/L (ref 3.5–5.1)
Sodium: 140 mmol/L (ref 135–145)

## 2017-11-25 LAB — GLUCOSE, CAPILLARY
Glucose-Capillary: 108 mg/dL — ABNORMAL HIGH (ref 65–99)
Glucose-Capillary: 175 mg/dL — ABNORMAL HIGH (ref 65–99)
Glucose-Capillary: 284 mg/dL — ABNORMAL HIGH (ref 65–99)
Glucose-Capillary: 291 mg/dL — ABNORMAL HIGH (ref 65–99)

## 2017-11-25 LAB — MAGNESIUM: MAGNESIUM: 2 mg/dL (ref 1.7–2.4)

## 2017-11-25 MED ORDER — NOVOLIN 70/30 RELION (70-30) 100 UNIT/ML ~~LOC~~ SUSP: 35.0000 [IU] | Freq: Two times a day (BID) | SUBCUTANEOUS | 11 refills | Status: DC

## 2017-11-25 MED ORDER — ASPIRIN 325 MG PO TABS: 325.0000 mg | ORAL_TABLET | Freq: Every day | ORAL | 0 refills | Status: DC

## 2017-11-25 MED ORDER — RIVAROXABAN (XARELTO) VTE STARTER PACK (15 & 20 MG): ORAL_TABLET | ORAL | 0 refills | Status: DC

## 2017-11-25 NOTE — Progress Notes (Signed)
Pt IV discontinued, catheter intact and telemetry removed. Pt discharge education provided at bedside with pt and pt family. Pt has all belongings. Pt discharged via wheelchair with nurse tech

## 2017-11-25 NOTE — Progress Notes (Signed)
Physical Therapy Treatment Patient Details Name: [name] MRN: [id number] DOB: [birth year] Today's Date: 11/25/2017    History of Present Illness Pt is a 67 y.o. female admitted to [facility] on 4/13 with DKA and in respiratory arrest; transferred to [facility] on mechanical ventilation, placed on pressors. Extubated 4/16. MRI +B posterior frontal lobe infarcts. DVT noted in the L soleal vein 4/17. PMH includes back L2-4 PLIF on 4/11, B shoulder surgeries, B TKA, remote back sx, DM, hypothyroid, CHF.     PT Comments    Patient mobilizing well today, ambulated in room and in hall without difficulty. Vitals stable throughout. Patient also able to perform self care tasks without assist. Anticipate patient will be safe for d/c home with HHPT and supervision during mobility.  Patient very eager to return home.  Follow Up Recommendations  Supervision for mobility/OOB;Home health PT     Equipment Recommendations  None recommended by PT    Recommendations for Other Services       Precautions / Restrictions Precautions Precautions: Back;Fall Precaution Booklet Issued: Yes (comment) Precaution Comments: pt needing maximum verbal cues to recall back precautions Required Braces or Orthoses: Spinal Brace Spinal Brace: Lumbar corset;Applied in sitting position    Mobility  Bed Mobility Overal bed mobility: Needs Assistance Bed Mobility: Rolling;Sidelying to Sit Rolling: Supervision Sidelying to sit: Supervision       General bed mobility comments: No physical assist required  Transfers Overall transfer level: Needs assistance Equipment used: Rolling walker (2 wheeled) Transfers: Sit to/from Stand Sit to Stand: Supervision         General transfer comment: no physical assist required, performed from bed and toiler with grab bar  Ambulation/Gait Ambulation/Gait assistance: Supervision Ambulation Distance (Feet): 180 Feet Assistive device: Rolling walker (2 wheeled) Gait  Pattern/deviations: Step-through pattern;Decreased stride length;Antalgic;Drifts right/left     General Gait Details: modest instability, no over LOB or difficulty with ambulation today. Use of RW and VSS thorughout HR in 80s with saturations >93%   Stairs             Wheelchair Mobility    Modified Rankin (Stroke Patients Only)       Balance Overall balance assessment: Needs assistance Sitting-balance support: No upper extremity supported;Feet supported Sitting balance-Leahy Scale: Fair     Standing balance support: During functional activity;Bilateral upper extremity supported Standing balance-Leahy Scale: Fair Standing balance comment: able to release UE support and perfrom some dynamic tasks without assist                            Cognition Arousal/Alertness: Awake/alert Behavior During Therapy: WFL for tasks assessed/performed Overall Cognitive Status: Impaired/Different from baseline Area of Impairment: Memory                     Memory: Decreased recall of precautions(but appears to adhere to them despite verbally 2/3 recall)                Exercises      General Comments        Pertinent Vitals/Pain Pain Assessment: No/denies pain    Home Living                      Prior Function            PT Goals (current goals can now be found in the care plan section) Acute Rehab PT Goals Patient Stated Goal: to get home  for easter PT Goal Formulation: With patient Time For Goal Achievement: 12/07/17 Potential to Achieve Goals: Good Progress towards PT goals: Progressing toward goals    Frequency    Min 3X/week      PT Plan Current plan remains appropriate    Co-evaluation              AM-PAC PT "6 Clicks" Daily Activity  Outcome Measure  Difficulty turning over in bed (including adjusting bedclothes, sheets and blankets)?: None Difficulty moving from lying on back to sitting on the side of the  bed? : None Difficulty sitting down on and standing up from a chair with arms (e.g., wheelchair, bedside commode, etc,.)?: None Help needed moving to and from a bed to chair (including a wheelchair)?: A Little Help needed walking in hospital room?: A Little Help needed climbing 3-5 steps with a railing? : A Little 6 Click Score: 21    End of Session Equipment Utilized During Treatment: Gait belt;Back brace Activity Tolerance: Patient tolerated treatment well Patient left: in chair;with call bell/phone within reach Nurse Communication: Mobility status PT Visit Diagnosis: Unsteadiness on feet (R26.81)     Time: 1610-9604 PT Time Calculation (min) (ACUTE ONLY): 19 min  Charges:  $Gait Training: 8-22 mins                    G Codes:       Alben Deeds, PT DPT  Board Certified Neurologic Specialist [place] 11/25/2017, 9:18 AM

## 2017-11-25 NOTE — Care Management (Signed)
Xarelto cards given to patient and explained.  [facility] aware of d/c.  Information placed on AVS for [facility].

## 2017-11-25 NOTE — Discharge Instructions (Signed)
Information on my medicine - XARELTO® (rivaroxaban) ° °WHY WAS XARELTO® PRESCRIBED FOR YOU? °Xarelto® was prescribed to treat blood clots that may have been found in the veins of your legs (deep vein thrombosis) or in your lungs (pulmonary embolism) and to reduce the risk of them occurring again. ° °What do you need to know about Xarelto®? °The starting dose is one 15 mg tablet taken TWICE daily with food for the FIRST 21 DAYS then the dose is changed to one 20 mg tablet taken ONCE A DAY with your evening meal. ° °DO NOT stop taking Xarelto® without talking to the health care provider who prescribed the medication.  Refill your prescription for 20 mg tablets before you run out. ° °After discharge, you should have regular check-up appointments with your healthcare provider that is prescribing your Xarelto®.  In the future your dose may need to be changed if your kidney function changes by a significant amount. ° °What do you do if you miss a dose? °If you are taking Xarelto® TWICE DAILY and you miss a dose, take it as soon as you remember. You may take two 15 mg tablets (total 30 mg) at the same time then resume your regularly scheduled 15 mg twice daily the next day. ° °If you are taking Xarelto® ONCE DAILY and you miss a dose, take it as soon as you remember on the same day then continue your regularly scheduled once daily regimen the next day. Do not take two doses of Xarelto® at the same time.  ° °Important Safety Information °Xarelto® is a blood thinner medicine that can cause bleeding. You should call your healthcare provider right away if you experience any of the following: °? Bleeding from an injury or your nose that does not stop. °? Unusual colored urine (red or dark brown) or unusual colored stools (red or black). °? Unusual bruising for unknown reasons. °? A serious fall or if you hit your head (even if there is no bleeding). ° °Some medicines may interact with Xarelto® and might increase your risk of  bleeding while on Xarelto®. To help avoid this, consult your healthcare provider or pharmacist prior to using any new prescription or non-prescription medications, including herbals, vitamins, non-steroidal anti-inflammatory drugs (NSAIDs) and supplements. ° °This website has more information on Xarelto®: www.xarelto.com. ° °

## 2017-11-25 NOTE — Discharge Summary (Signed)
Physician Discharge Summary  [name]:[id number] DOB: 1951-06-08 DOA: 11/18/2017  PCP: Harlan Stains, MD  Admit date: 11/18/2017 Discharge date: 11/25/2017  Admitted From: Home Disposition: Home   Recommendations for Outpatient Follow-up:  1. Follow up with PCP in 1-2 weeks 2. Please obtain BMP/CBC in one week 3. Follow up with Dr. Buddy Duty, endocrinology for diabetes management. Discharged on novolin 70/30 insulin 35u BID based on inpatient requirements with instructions to decrease if hypoglycemia develops or call MD if persistently hyperglycemic. 4. Recheck TSH outside scope of illness 5. Please follow up results of serotonin release assay to confirm heparin-induced thrombocytopenia. Ab positive, platelets improved with holding heparin.   Home Health: PT, OT Equipment/Devices: None Discharge Condition: Stable CODE STATUS: Full Diet recommendation: Heart healthy, carb-modified  Brief/Interim Summary: 67 yo F with IDDM, hypothyroidism, G1DD (EF 65-70%), who recently had L spine surgery on 4/12, was taken emergently to the ER for DKA, metabolic acidosis and respiratory arrest en route, she was intubated and admitted to the ICU. She was started on pressors, empiric antibiotics, insulin.She was extubated 4/15. DKA resolved though she continued to be encephalopathic and neurology was consulted. Neuroimaging revealed frontal infarcts with negative work up for secondary/embolic causes. Encephalopathy has improved, though pt remains very weak, requiring home health. Ancef was given for Klebsiella PNA with resolution in SIRS criteria and respiratory symptoms. Xarelto was started for soleal vein DVT and care management was consulted for 30-day card.   Discharge Diagnoses:  Active Problems:   DKA (diabetic ketoacidoses) (HCC)   Diabetic ketoacidosis (HCC)   Acute respiratory failure with hypoxia (HCC)   Acute renal failure (HCC)   Endotracheal tube present   Septic shock (HCC)   Cerebral  thrombosis with cerebral infarction   Acute encephalopathy  Acute encephalopathy: Likely multifactorial in the setting of DKA, PNA, ICU admission/respiratory failure, and new CVA. EEG done on 4/17, without epileptiform discharges - Resolved.  Acute CVA: MRI of the brain done on 4/16 showed small acute bilateral posterior frontal lobe infarcts, concern for watershed versus embolic. ?Marrow embolism s/p spinal surgery. TEE without cardioembolic source, negative bubble study. A1c 7.4, LDL 35. CT angiogram without any significant intracranial or extracranial stenosis or occlusion - On xarelto as below.  - Continue PT/OT  Acute hypoxic respiratory failure: Resolved. In the setting of acute encephalopathy, patient extubated 4/15.  Hypertension - Permissive hypertension for acute stroke, restart medications at discharge.   Troponin elevation - No chest pain or ACS type symptoms, likely demand. No further evaluation planned.  Acute kidney injury: In the setting of hypotension/shock, now resolved  Hypokalemia:  - Replaced    Hypothyroidism: TSH 0.265.  - Recheck TSH outside scope of illness - Continue synthroid   Klebsiella pneumonia: With leukocytosis, CT scan did show upper lobe opacity, has been intubated and tracheal aspirate is positive for Klebsiella - Continued ancef for total of 5 days (final dose 4/20)  DKA: Resolved. Possibly triggered by surgery.   T2DM:  - Continued basal insulin and sliding scale while admitted, transition to 70/30 which the patient was on with dose changed to 35 units BID based on inpatient requirements with directions for titration and urged quick follow up with Dr. Buddy Duty.   DVT: Ultrasound done on 4/17 showed DVT in the left soleal vein. - Initially on heparin, now xarelto as above  Thrombocytopenia, possibly HIT: Worsened in setting of heparin subQ and then infusion. Not typical of HIT (discussed with Dr. Benay Spice by phone 4/18).  - Plt's  improved 4/19.  - Antibody positive > will send SRA for confirmation.  - Monitor for bleeding.   Discharge Instructions Discharge Instructions    Diet - low sodium heart healthy   Complete by:  As directed    Diet Carb Modified   Complete by:  As directed    Discharge instructions   Complete by:  As directed    You were admitted in DKA and found to have a stroke, blood clot in the leg, and pneumonia. Fortunately, these are all being treated effectively and you are stable for discharge with the following recommendations:  - Continue taking insulin, as we discussed start 35 units twice daily and decrease if you develop hypoglycemia or increase/speak with Dr. Buddy Duty if you have consistently elevated blood sugars ( over 200mg /dl).  - Follow up with Dr. Buddy Duty as soon as possible.  - Start taking xarelto as directed to treat the blood clot - Start taking aspirin 325mg  instead of 81mg  to prevent future stroke - You have completed the course of antibiotics.  - If your symptoms return seek medical attention right away.   Increase activity slowly   Complete by:  As directed      Allergies as of 11/25/2017      Reactions   Codeine Nausea And Vomiting   Ambien [zolpidem Tartrate] Other (See Comments)   Up sleep walking and eating   Crestor [rosuvastatin] Other (See Comments)   Muscle weakness, cramps and aching all over body   Lipitor [atorvastatin] Itching, Other (See Comments)   Muscle weakness, cramps and aches all over body   Morphine And Related Other (See Comments)   Flushing and feeling hot Tolerates with Diphenhydramine   Penicillins Rash, Other (See Comments)   Caused Headaches also Has patient had a PCN reaction causing immediate rash, facial/tongue/throat swelling, SOB or lightheadedness with hypotension: No Has patient had a PCN reaction causing severe rash involving mucus membranes or skin necrosis: No Has patient had a PCN reaction that required hospitalization No Has patient  had a PCN reaction occurring within the last 10 years: No If all of the above answers are "NO", then may proceed with Cephalosporin use.   Statins Other (See Comments)   Muscle weakness, cramps and aching all over body   Dilaudid [hydromorphone Hcl] Other (See Comments)   Hallucinations/ argumentative, goes beserk   Heparin Other (See Comments)   HIT ab positive   Amitriptyline Other (See Comments)   Loopy feeling   Ceftin [cefuroxime Axetil] Other (See Comments)   Stomach pain    Lovaza [omega-3-acid Ethyl Esters] Other (See Comments)   Stomach pain    Lunesta [eszopiclone] Other (See Comments)   Causes dizziness   Lyrica [pregabalin] Nausea Only   Metformin And Related Nausea Only   Pt denies this intolerance      Medication List    STOP taking these medications   aspirin 81 MG chewable tablet Replaced by:  aspirin 325 MG tablet   doxycycline 100 MG tablet Commonly known as:  VIBRA-TABS     TAKE these medications   acetaminophen 500 MG tablet Commonly known as:  TYLENOL Take 1,000 mg by mouth every 8 (eight) hours as needed for mild pain or headache.   amLODipine 5 MG tablet Commonly known as:  NORVASC Take 5 mg by mouth daily.   aspirin 325 MG tablet Take 1 tablet (325 mg total) by mouth daily. Start taking on:  11/26/2017 Replaces:  aspirin 81 MG chewable tablet  B-12 2500 MCG Tabs Take 2,500 mcg by mouth daily.   clobetasol 0.05 % external solution Commonly known as:  TEMOVATE Apply 1 application topically See admin instructions. Apply twice daily to scalp and ear as needed for itching/rash (do not apply to face/groin/underarms)   cyclobenzaprine 10 MG tablet Commonly known as:  FLEXERIL Take 1 tablet (10 mg total) by mouth 3 (three) times daily as needed for muscle spasms.   docusate sodium 100 MG capsule Commonly known as:  COLACE Take 1 capsule (100 mg total) by mouth 2 (two) times daily.   DULoxetine 60 MG capsule Commonly known as:   CYMBALTA Take 120 mg by mouth daily.   ferrous sulfate 325 (65 FE) MG tablet Take 1 tablet (325 mg total) by mouth 2 (two) times daily with a meal.   fluticasone 50 MCG/ACT nasal spray Commonly known as:  FLONASE Place 2 sprays into both nostrils daily as needed for allergies or rhinitis.   furosemide 20 MG tablet Commonly known as:  LASIX Take 20 mg by mouth daily as needed for fluid or edema.   JARDIANCE 10 MG Tabs tablet Generic drug:  empagliflozin Take 10 mg by mouth daily.   levothyroxine 150 MCG tablet Commonly known as:  SYNTHROID, LEVOTHROID Take 150 mcg by mouth daily before breakfast.   NOVOLIN 70/30 RELION (70-30) 100 UNIT/ML injection Generic drug:  insulin NPH-regular Human Inject 35 Units into the skin 2 (two) times daily. What changed:  how much to take   nystatin 100000 UNIT/ML suspension Commonly known as:  MYCOSTATIN Use as directed 5 mLs in the mouth or throat 4 (four) times daily as needed (for tongue burning/mouth sores.).   olmesartan 40 MG tablet Commonly known as:  BENICAR Take 40 mg by mouth daily.   oxyCODONE 5 MG immediate release tablet Commonly known as:  Oxy IR/ROXICODONE Take 1 tablet (5 mg total) by mouth every 4 (four) hours as needed for moderate pain ((score 4 to 6)).   pantoprazole 40 MG tablet Commonly known as:  PROTONIX Take 40 mg by mouth 2 (two) times daily.   PROBIOTIC PO Take 1 capsule by mouth daily.   promethazine 25 MG tablet Commonly known as:  PHENERGAN Take 25 mg by mouth every 8 (eight) hours as needed for nausea or vomiting.   Rivaroxaban 15 & 20 MG Tbpk Take as directed on package: Start with one 15mg  tablet by mouth twice a day with food. On Day 22, switch to one 20mg  tablet once a day with food.   temazepam 15 MG capsule Commonly known as:  RESTORIL Take 30 mg by mouth at bedtime.      Follow-up Information    Harlan Stains, MD. Schedule an appointment as soon as possible for a visit in 2 week(s).    Specialty:  Family Medicine Contact information: [place] [address] [phone]        Delrae Rend, MD. Schedule an appointment as soon as possible for a visit in 1 week(s).   Specialty:  Endocrinology Contact information: [address] [place] [address] [phone]          Allergies  Allergen Reactions  . Codeine Nausea And Vomiting  . Ambien [Zolpidem Tartrate] Other (See Comments)    Up sleep walking and eating  . Crestor [Rosuvastatin] Other (See Comments)    Muscle weakness, cramps and aching all over body  . Lipitor [Atorvastatin] Itching and Other (See Comments)  Muscle weakness, cramps and aches all over body  . Morphine And Related Other (See Comments)    Flushing and feeling hot Tolerates with Diphenhydramine  . Penicillins Rash and Other (See Comments)    Caused Headaches also Has patient had a PCN reaction causing immediate rash, facial/tongue/throat swelling, SOB or lightheadedness with hypotension: No Has patient had a PCN reaction causing severe rash involving mucus membranes or skin necrosis: No Has patient had a PCN reaction that required hospitalization No Has patient had a PCN reaction occurring within the last 10 years: No If all of the above answers are "NO", then may proceed with Cephalosporin use.    . Statins Other (See Comments)    Muscle weakness, cramps and aching all over body  . Dilaudid [Hydromorphone Hcl] Other (See Comments)    Hallucinations/ argumentative, goes beserk  . Heparin Other (See Comments)    HIT ab positive  . Amitriptyline Other (See Comments)    Loopy feeling  . Ceftin [Cefuroxime Axetil] Other (See Comments)    Stomach pain   . Lovaza [Omega-3-Acid Ethyl Esters] Other (See Comments)    Stomach pain   . Lunesta [Eszopiclone] Other (See Comments)    Causes dizziness  . Lyrica [Pregabalin] Nausea Only  . Metformin And Related Nausea Only    Pt denies this  intolerance    Consultations:  Neurology  PCCM  Procedures/Studies: Ct Angio Head W Or Wo Contrast  Addendum Date: 11/22/2017   ADDENDUM REPORT: 11/22/2017 10:01 ADDENDUM: Additional note made of RIGHT upper lobe opacity, possible pneumonia. Layering fluid RIGHT maxillary sinus suggesting acuity. Electronically Signed   By: Staci Righter M.D.   On: 11/22/2017 10:01   Result Date: 11/22/2017 CLINICAL DATA:  Follow-up cerebrovascular accident. Encephalopathy with respiratory arrest. EXAM: CT ANGIOGRAPHY HEAD TECHNIQUE: Multidetector CT imaging of the head was performed using the standard protocol during bolus administration of intravenous contrast. Multiplanar CT image reconstructions and MIPs were obtained to evaluate the vascular anatomy. CONTRAST:  76mL ISOVUE-370 IOPAMIDOL (ISOVUE-370) INJECTION 76% COMPARISON:  MRI brain 11/21/2017. FINDINGS: CTA NECK Aortic arch: Standard branching. Imaged portion shows no evidence of aneurysm or dissection. No significant stenosis of the major arch vessel origins. Right carotid system: Minor calcific atheromatous change. No evidence of dissection, stenosis (50% or greater) or occlusion. Left carotid system: No significant atheromatous disease. No evidence of dissection, stenosis (50% or greater) or occlusion. Vertebral arteries: LEFT vertebral dominant. No evidence of dissection, stenosis (50% or greater) or occlusion. CTA HEAD Anterior circulation: Skull base internal carotid arteries widely patent. No signs of large vessel occlusion. No significant anterior circulation stenosis, proximal occlusion, aneurysm, or vascular malformation. Posterior circulation: Both vertebrals are patent, and contribute to the basilar, LEFT dominant. No significant stenosis, proximal occlusion, aneurysm, or vascular malformation. Venous sinuses: As permitted by contrast timing, patent. Anatomic variants: None of significance. Delayed phase: No abnormal intracranial enhancement.  BILATERAL cortical hypodensities are developing, correlating with the observed areas of restricted diffusion on MR. IMPRESSION: No intracranial or extracranial stenosis or occlusion. BILATERAL cortical hypodensities are developing, without associated abnormal intracranial enhancement. In the appropriate clinical setting, these could represent watershed infarcts. Electronically Signed: By: Staci Righter M.D. On: 11/22/2017 09:37   Dg Lumbar Spine 2-3 Views  Result Date: 11/15/2017 CLINICAL DATA:  POSTERIOR LUMBAR INTERBODY FUSION, POSTERIOR INSTRUMENTATION, POSSIBLE INTERBODY PROSTHESIS LUMBAR TWO- LUMBAR THREE; EXPLORE LUMBAR FUSION. Fluoro time: 17 seconds. EXAM: DG C-ARM 61-120 MIN; LUMBAR SPINE - 2-3 VIEW COMPARISON:  MRI, 09/24/2017.  Radiographs, 08/22/2017.  FINDINGS: Two submitted operative images show placement of new pedicle screws at L2 with a metallic intervertebral spacer. Pedicle screws and spacer appear well-positioned. The pedicle screws at L3 and L4 are unchanged from prior exam. IMPRESSION: Operative imaging provided or extension of the posterior lumbar spine fusion to the L2 vertebra. Electronically Signed   By: Lajean Manes M.D.   On: 11/15/2017 16:05   Dg Abd 1 View  Result Date: 11/18/2017 CLINICAL DATA:  OG tube placement EXAM: ABDOMEN - 1 VIEW COMPARISON:  CT abdomen/pelvis dated 11/14/2016 FINDINGS: Enteric tube terminates in the distal gastric antrum. Nonobstructive bowel gas pattern. Cholecystectomy clips. Status post PLIF at L2-4. IMPRESSION: Enteric tube terminates in the distal gastric antrum. Electronically Signed   By: Julian Hy M.D.   On: 11/18/2017 21:05   Ct Head Wo Contrast  Result Date: 11/21/2017 CLINICAL DATA:  67 year old female confused with repetitive speech. Recent respiratory arrest and lumbar surgery. Subsequent encounter. EXAM: CT HEAD WITHOUT CONTRAST TECHNIQUE: Contiguous axial images were obtained from the base of the skull through the vertex  without intravenous contrast. COMPARISON:  11/18/2017 and 10/29/2014 head CT. 01/02/2015 brain MR. FINDINGS: Brain: New small hypodensity posterosuperior right frontal lobe possibly small acute infarct. No intracranial hemorrhage. Mild atrophy. No intracranial mass lesion noted on this unenhanced exam. Vascular: Vascular calcifications. Skull: No acute abnormality. Sinuses/Orbits: Exophthalmos without acute orbital abnormality. Pansinus mucosal thickening/partial opacification. Other: Partial opacification mastoid air cells bilaterally. No obstructing lesion of eustachian tube noted. Middle ear cavities are clear bilaterally. IMPRESSION: New small hypodensity posterosuperior right frontal lobe possibly small acute infarct. No intracranial hemorrhage. Mild atrophy. Exophthalmos. Pansinus mucosal thickening/partial opacification. Partial opacification mastoid air cells bilaterally. No obstructing lesion of eustachian tube noted. These results will be called to the ordering clinician or representative by the Radiologist Assistant, and communication documented in the PACS or zVision Dashboard. Electronically Signed   By: Genia Del M.D.   On: 11/21/2017 11:11   Ct Head Wo Contrast  Result Date: 11/18/2017 CLINICAL DATA:  Altered level of consciousness EXAM: CT HEAD WITHOUT CONTRAST TECHNIQUE: Contiguous axial images were obtained from the base of the skull through the vertex without intravenous contrast. COMPARISON:  MRI brain dated 01/02/2015 FINDINGS: Motion degraded images. Brain: No evidence of acute infarction, hemorrhage, hydrocephalus, extra-axial collection or mass lesion/mass effect. Mild small vessel ischemic changes. Vascular: No hyperdense vessel or unexpected calcification. Skull: Normal. Negative for fracture or focal lesion. Sinuses/Orbits: Partial opacification/mucosal thickening of the right maxillary sinus. Mastoid air cells are clear. Other: None. IMPRESSION: Motion degraded images. No evidence  of acute intracranial abnormality. Mild small vessel ischemic changes. Electronically Signed   By: Julian Hy M.D.   On: 11/18/2017 16:56   Ct Angio Neck W Or Wo Contrast  Addendum Date: 11/22/2017   ADDENDUM REPORT: 11/22/2017 10:01 ADDENDUM: Additional note made of RIGHT upper lobe opacity, possible pneumonia. Layering fluid RIGHT maxillary sinus suggesting acuity. Electronically Signed   By: Staci Righter M.D.   On: 11/22/2017 10:01   Result Date: 11/22/2017 CLINICAL DATA:  Follow-up cerebrovascular accident. Encephalopathy with respiratory arrest. EXAM: CT ANGIOGRAPHY HEAD TECHNIQUE: Multidetector CT imaging of the head was performed using the standard protocol during bolus administration of intravenous contrast. Multiplanar CT image reconstructions and MIPs were obtained to evaluate the vascular anatomy. CONTRAST:  67mL ISOVUE-370 IOPAMIDOL (ISOVUE-370) INJECTION 76% COMPARISON:  MRI brain 11/21/2017. FINDINGS: CTA NECK Aortic arch: Standard branching. Imaged portion shows no evidence of aneurysm or dissection. No significant stenosis of the  major arch vessel origins. Right carotid system: Minor calcific atheromatous change. No evidence of dissection, stenosis (50% or greater) or occlusion. Left carotid system: No significant atheromatous disease. No evidence of dissection, stenosis (50% or greater) or occlusion. Vertebral arteries: LEFT vertebral dominant. No evidence of dissection, stenosis (50% or greater) or occlusion. CTA HEAD Anterior circulation: Skull base internal carotid arteries widely patent. No signs of large vessel occlusion. No significant anterior circulation stenosis, proximal occlusion, aneurysm, or vascular malformation. Posterior circulation: Both vertebrals are patent, and contribute to the basilar, LEFT dominant. No significant stenosis, proximal occlusion, aneurysm, or vascular malformation. Venous sinuses: As permitted by contrast timing, patent. Anatomic variants: None of  significance. Delayed phase: No abnormal intracranial enhancement. BILATERAL cortical hypodensities are developing, correlating with the observed areas of restricted diffusion on MR. IMPRESSION: No intracranial or extracranial stenosis or occlusion. BILATERAL cortical hypodensities are developing, without associated abnormal intracranial enhancement. In the appropriate clinical setting, these could represent watershed infarcts. Electronically Signed: By: Staci Righter M.D. On: 11/22/2017 09:37   Mr Brain Wo Contrast  Result Date: 11/21/2017 CLINICAL DATA:  Encephalopathy. Recent lumbar spine surgery, respiratory arrest, and diabetic ketoacidosis. Persistent altered mental status after extubation. EXAM: MRI HEAD WITHOUT CONTRAST TECHNIQUE: Multiplanar, multiecho pulse sequences of the brain and surrounding structures were obtained without intravenous contrast. COMPARISON:  Head CT 11/21/2017 and MRI 01/02/2015 FINDINGS: Multiple sequences are mildly to moderately motion degraded. Brain: There are several small acute cortical/subcortical infarcts in the high posterior frontal lobes bilaterally. No intracranial hemorrhage, mass, midline shift, or extra-axial fluid collection is identified. There is mild cerebral atrophy. No significant chronic white matter disease is seen. Vascular: Major intracranial vascular flow voids are preserved. Skull and upper cervical spine: Unremarkable bone marrow signal. Sinuses/Orbits: Bilateral cataract extraction. Mild mucosal thickening throughout the paranasal sinuses. Moderate bilateral mastoid effusions. Other: None. IMPRESSION: Small acute bilateral posterior frontal lobe infarcts. Electronically Signed   By: Logan Bores M.D.   On: 11/21/2017 17:28   Dg Chest Port 1 View  Result Date: 11/20/2017 CLINICAL DATA:  Endotracheal tube position. EXAM: PORTABLE CHEST 1 VIEW COMPARISON:  Radiograph of November 18, 2017. FINDINGS: The heart size and mediastinal contours are within  normal limits. Endotracheal tube is not changed in position, with distal tip 3 cm above the carina. Nasogastric tube is unchanged in position. Left internal jugular catheter is unchanged with distal tip in expected position of the SVC. No pneumothorax or pleural effusion is noted. Both lungs are clear. The visualized skeletal structures are unremarkable. IMPRESSION: Stable position of endotracheal and nasogastric tubes. No acute cardiopulmonary abnormality seen. Electronically Signed   By: Marijo Conception, M.D.   On: 11/20/2017 08:41   Dg Chest Port 1 View  Result Date: 11/18/2017 CLINICAL DATA:  Encounter for central line placement. EXAM: PORTABLE CHEST 1 VIEW COMPARISON:  Radiographs earlier this day. FINDINGS: Tip of the endotracheal tube 2.1 cm from the carina. Enteric tube in place with tip below the diaphragm not included in the field of view. New left internal jugular central venous catheter tip in the proximal SVC. No pneumothorax. Lower lung volumes from prior exam with bronchovascular crowding. Mild perihilar atelectasis greater on the left. Unchanged cardiomegaly and tortuous thoracic aorta. No large pleural effusion. IMPRESSION: 1. New left internal jugular central venous catheter tip in the proximal SVC. No pneumothorax. 2. Low lung volumes with bronchovascular crowding and mild perihilar atelectasis. Electronically Signed   By: Jeb Levering M.D.   On: 11/18/2017 22:21  Dg Chest Port 1 View  Result Date: 11/18/2017 CLINICAL DATA:  Hypoxia EXAM: PORTABLE CHEST 1 VIEW COMPARISON:  Study obtained earlier in the day FINDINGS: Endotracheal tube tip is 2.7 cm above the carina. Nasogastric tube tip and side port are below the diaphragm. No pneumothorax. There is no edema or consolidation. Heart is upper normal in size with pulmonary vascularity within normal limits. No adenopathy. There is postoperative change in the lower cervical spine. IMPRESSION: Tube positions as described without  pneumothorax. No edema or consolidation. Stable cardiac silhouette. Electronically Signed   By: Lowella Grip III M.D.   On: 11/18/2017 21:14   Dg Chest Portable 1 View  Result Date: 11/18/2017 CLINICAL DATA:  67 year old female intubated for respiratory distress. Postoperative day 3 lumbar spine surgery. EXAM: PORTABLE CHEST 1 VIEW COMPARISON:  Portable chest 03/05/2017. FINDINGS: Portable AP supine view at 1544 hours. Endotracheal tube tip is in good position between the level of the clavicles and carina. Lung volumes are within normal limits. Mediastinal contours are stable and within normal limits no pneumothorax or pleural effusion. Allowing for portable technique the lungs are clear. Negative visible bowel gas pattern. Cervical ACDF hardware. No acute osseous abnormality identified. IMPRESSION: 1. Endotracheal tube tip in good position. 2.  No acute cardiopulmonary abnormality identified. Electronically Signed   By: Genevie Ann M.D.   On: 11/18/2017 15:55   Dg C-arm 1-60 Min  Result Date: 11/15/2017 CLINICAL DATA:  POSTERIOR LUMBAR INTERBODY FUSION, POSTERIOR INSTRUMENTATION, POSSIBLE INTERBODY PROSTHESIS LUMBAR TWO- LUMBAR THREE; EXPLORE LUMBAR FUSION. Fluoro time: 17 seconds. EXAM: DG C-ARM 61-120 MIN; LUMBAR SPINE - 2-3 VIEW COMPARISON:  MRI, 09/24/2017.  Radiographs, 08/22/2017. FINDINGS: Two submitted operative images show placement of new pedicle screws at L2 with a metallic intervertebral spacer. Pedicle screws and spacer appear well-positioned. The pedicle screws at L3 and L4 are unchanged from prior exam. IMPRESSION: Operative imaging provided or extension of the posterior lumbar spine fusion to the L2 vertebra. Electronically Signed   By: Lajean Manes M.D.   On: 11/15/2017 16:05    [facility] CVC 4/13 discontinued 4/19 Foley catheter 4/16, discontinued PTD  CULTURES: 4/13 Blood Cx: Staph epi 1 out of 2 4/14 Resp GM:[id number] pneumoniae  ANTIBIOTICS: Cefepime 4/13-4/14 Ancef  4/16-4/20  Subjective: Feels well, wants to go home. Walked in halls with stable vital signs. CBGs improved dramatically. No chest pain or dyspnea. Leg swelling is minimal, no bleeding.   Discharge Exam: Vitals:   11/24/17 1957 11/25/17 0431  BP: (!) 144/64 (!) 152/73  Pulse: 86 89  Resp: 18 18  Temp: 98.6 F (37 C) 98.5 F (36.9 C)  SpO2: 98% 99%   Gen: 67 y.o. female in no distress Pulm: Non-labored breathing room air. Clear to auscultation bilaterally.  CV: Regular rate and rhythm. No murmur, rub, or gallop. No JVD, no pedal edema. GI: Abdomen soft, non-tender, non-distended, with normoactive bowel sounds. No organomegaly or masses felt. Ext: Warm, no deformities Skin: No rashes, lesions or ulcers Neuro: Alert and oriented. No focal neurological deficits. Psych: Judgement and insight appear normal. Mood & affect appropriate.   Labs: BNP (last 3 results) Recent Labs    11/18/17 1528  BNP 4,403.4*   Basic Metabolic Panel: Recent Labs  Lab 11/18/17 1528 11/18/17 2105  11/19/17 1019  11/22/17 0800 11/22/17 1439 11/23/17 0258 11/23/17 1000 11/24/17 0457 11/25/17 0604  NA 133* 136   < > 146*   < > 139 141 138  --  140 140  K 6.9*  5.3*   < > 4.0   < > 5.8* 3.3* 3.2* 3.1* 3.0* 3.8  CL 100* 106   < > 115*   < > 108 107 105  --  106 106  CO2 4* <7*   < > 20*   < > 24 24 23   --  25 24  GLUCOSE 741* 605*   < > 155*   < > 178* 235* 222*  --  78 127*  BUN 54* 57*   < > 43*   < > 12 11 10   --  6 5*  CREATININE 2.99* 3.62*   < > 2.00*   < > 0.74 0.94 0.91  --  0.87 0.95  CALCIUM 8.3* 7.9*   < > 7.9*   < > 7.8* 7.9* 7.9*  --  8.3* 9.1  MG 3.1* 2.6*  --   --   --   --   --   --   --   --  2.0  PHOS  --  7.3*  --  2.6  --   --   --   --   --   --   --    < > = values in this interval not displayed.   Liver Function Tests: Recent Labs  Lab 11/18/17 1528 11/18/17 2105 11/21/17 0515  AST 24 47* 26  ALT 18 28 30   ALKPHOS 104 109 80  BILITOT 1.9* 1.8* 0.7  PROT 6.0* 5.6*  5.3*  ALBUMIN 3.0* 2.9* 2.5*   Recent Labs  Lab 11/21/17 1000  LIPASE 35   Recent Labs  Lab 11/18/17 1609 11/19/17 0526 11/20/17 0429  AMMONIA 103* 18 18   CBC: Recent Labs  Lab 11/18/17 1528 11/18/17 2105  11/21/17 0515 11/22/17 0650 11/23/17 0258 11/24/17 0457 11/25/17 0604  WBC 29.3* 36.1*   < > 7.4 8.3 6.9 9.2 10.7*  NEUTROABS 24.6* 33.2*  --   --   --  5.2  --   --   HGB 6.8* 6.8*   < > 8.6* 8.8* 8.6* 9.6* 10.9*  HCT 27.2* 26.6*   < > 29.4* 29.9* 30.2* 32.5* 37.6  MCV 73.5* 69.5*   < > 68.7* 69.4* 69.6* 69.0* 70.4*  PLT 708* 572*   < > 102* 82* 67* 120* 168   < > = values in this interval not displayed.   Cardiac Enzymes: Recent Labs  Lab 11/18/17 1528 11/18/17 2105 11/19/17 0417 11/19/17 1019 11/19/17 1617  TROPONINI 0.32* 1.18* 3.10* 3.20* 2.41*   BNP: Invalid input(s): POCBNP CBG: Recent Labs  Lab 11/24/17 1503 11/24/17 2004 11/25/17 0007 11/25/17 0415 11/25/17 0753  GLUCAP 179* 237* 284* 175* 108*   D-Dimer No results for input(s): DDIMER in the last 72 hours. Hgb A1c Recent Labs    11/22/17 1439  HGBA1C 7.4*   Urinalysis    Component Value Date/Time   COLORURINE YELLOW 11/18/2017 1520   APPEARANCEUR CLEAR 11/18/2017 1520   LABSPEC 1.018 11/18/2017 1520   PHURINE 5.0 11/18/2017 1520   GLUCOSEU >=500 (A) 11/18/2017 1520   HGBUR SMALL (A) 11/18/2017 1520   BILIRUBINUR NEGATIVE 11/18/2017 1520   KETONESUR 80 (A) 11/18/2017 1520   PROTEINUR NEGATIVE 11/18/2017 1520   UROBILINOGEN 0.2 07/30/2012 2228   NITRITE NEGATIVE 11/18/2017 1520   LEUKOCYTESUR NEGATIVE 11/18/2017 1520    Microbiology Recent Results (from the past 240 hour(s))  MRSA PCR Screening     Status: None   Collection Time: 11/18/17  8:25 PM  Result Value Ref  Range Status   MRSA by PCR NEGATIVE NEGATIVE Final    Comment:        The GeneXpert MRSA Assay (FDA approved for NASAL specimens only), is one component of a comprehensive MRSA colonization surveillance  program. It is not intended to diagnose MRSA infection nor to guide or monitor treatment for MRSA infections. Performed at [facility], [place] [address] [place], [place] [address]   Culture, blood (routine x 2)     Status: Abnormal   Collection Time: 11/18/17  9:02 PM  Result Value Ref Range Status   Specimen Description BLOOD CENTRAL LINE  Final   Special Requests   Final    BOTTLES DRAWN AEROBIC AND ANAEROBIC Blood Culture adequate volume   Culture  Setup Time   Final    GRAM POSITIVE COCCI IN CLUSTERS ANAEROBIC BOTTLE ONLY Organism ID to follow CRITICAL RESULT CALLED TO, READ BACK BY AND VERIFIED WITH: Leonie Green PharmD 9:55 11/20/17 ([email or web address])    Culture (A)  Final    STAPHYLOCOCCUS SPECIES (COAGULASE NEGATIVE) THE SIGNIFICANCE OF ISOLATING THIS ORGANISM FROM A SINGLE SET OF BLOOD CULTURES WHEN MULTIPLE SETS ARE DRAWN IS UNCERTAIN. PLEASE NOTIFY THE MICROBIOLOGY DEPARTMENT WITHIN ONE WEEK IF SPECIATION AND SENSITIVITIES ARE REQUIRED. Performed at [facility], [place] [address], [place], [place] [address]    Report Status 11/22/2017 FINAL  Final  Blood Culture ID Panel (Reflexed)     Status: Abnormal   Collection Time: 11/18/17  9:02 PM  Result Value Ref Range Status   Enterococcus species NOT DETECTED NOT DETECTED Final   Listeria monocytogenes NOT DETECTED NOT DETECTED Final   Staphylococcus species DETECTED (A) NOT DETECTED Final    Comment: Methicillin (oxacillin) susceptible coagulase negative staphylococcus. Possible blood culture contaminant (unless isolated from more than one blood culture draw or clinical case suggests pathogenicity). No antibiotic treatment is indicated for blood  culture contaminants. CRITICAL RESULT CALLED TO, READ BACK BY AND VERIFIED WITH: Leonie Green PharmD 9:55 11/20/17 ([email or web address])    Staphylococcus aureus NOT DETECTED NOT DETECTED Final   Methicillin resistance NOT DETECTED NOT DETECTED Final   Streptococcus species NOT DETECTED NOT  DETECTED Final   Streptococcus agalactiae NOT DETECTED NOT DETECTED Final   Streptococcus pneumoniae NOT DETECTED NOT DETECTED Final   Streptococcus pyogenes NOT DETECTED NOT DETECTED Final   Acinetobacter baumannii NOT DETECTED NOT DETECTED Final   Enterobacteriaceae species NOT DETECTED NOT DETECTED Final   Enterobacter cloacae complex NOT DETECTED NOT DETECTED Final   Escherichia coli NOT DETECTED NOT DETECTED Final   Klebsiella oxytoca NOT DETECTED NOT DETECTED Final   Klebsiella pneumoniae NOT DETECTED NOT DETECTED Final   Proteus species NOT DETECTED NOT DETECTED Final   Serratia marcescens NOT DETECTED NOT DETECTED Final   Haemophilus influenzae NOT DETECTED NOT DETECTED Final   Neisseria meningitidis NOT DETECTED NOT DETECTED Final   Pseudomonas aeruginosa NOT DETECTED NOT DETECTED Final   Candida albicans NOT DETECTED NOT DETECTED Final   Candida glabrata NOT DETECTED NOT DETECTED Final   Candida krusei NOT DETECTED NOT DETECTED Final   Candida parapsilosis NOT DETECTED NOT DETECTED Final   Candida tropicalis NOT DETECTED NOT DETECTED Final    Comment: Performed at [facility], [address] [place], [place] [address]  Culture, blood (routine x 2)     Status: None   Collection Time: 11/19/17  3:54 AM  Result Value Ref Range Status   Specimen Description BLOOD RIGHT THUMB  Final   Special  Requests   Final    BOTTLES DRAWN AEROBIC AND ANAEROBIC Blood Culture adequate volume   Culture   Final    NO GROWTH 5 DAYS Performed at [facility], [place] [address], [place], [place] [address]    Report Status 11/24/2017 FINAL  Final  Culture, respiratory (NON-Expectorated)     Status: None   Collection Time: 11/19/17  5:42 PM  Result Value Ref Range Status   Specimen Description TRACHEAL ASPIRATE  Final   Special Requests NONE  Final   Gram Stain   Final    MODERATE WBC PRESENT, PREDOMINANTLY PMN RARE SQUAMOUS EPITHELIAL CELLS PRESENT ABUNDANT GRAM POSITIVE COCCI IN  CLUSTERS FEW BUDDING YEAST SEEN Performed at [facility], [place] [address] [place], [place] [address]    Culture MODERATE KLEBSIELLA PNEUMONIAE  Final   Report Status 11/21/2017 FINAL  Final   Organism ID, Bacteria KLEBSIELLA PNEUMONIAE  Final      Susceptibility   Klebsiella pneumoniae - MIC*    AMPICILLIN >=32 RESISTANT Resistant     CEFAZOLIN <=4 SENSITIVE Sensitive     CEFEPIME <=1 SENSITIVE Sensitive     CEFTAZIDIME <=1 SENSITIVE Sensitive     CEFTRIAXONE <=1 SENSITIVE Sensitive     CIPROFLOXACIN <=0.25 SENSITIVE Sensitive     GENTAMICIN <=1 SENSITIVE Sensitive     IMIPENEM <=0.25 SENSITIVE Sensitive     TRIMETH/SULFA <=20 SENSITIVE Sensitive     AMPICILLIN/SULBACTAM 8 SENSITIVE Sensitive     PIP/TAZO 16 SENSITIVE Sensitive     Extended ESBL NEGATIVE Sensitive     * MODERATE KLEBSIELLA PNEUMONIAE    Time coordinating discharge: Approximately 40 minutes  Vance Gather, MD  [facility] 11/25/2017, 11:26 AM Pager [phone]

## 2017-11-25 NOTE — Progress Notes (Signed)
Called case manager regarding pt home health and blood thinner prescription card  Case manager aware

## 2017-11-25 NOTE — Progress Notes (Signed)
Pt stated MD told her she needed another dose of antibiotics prior to discharge  Pt demands RN hangs IV antibiotics now  Informed pt that last dose was given at [phone] and it is too early. Orders state every 8 hours. Pt is upset, states her husband is on the way to pick her up  Informed pt that there are no discharge orders at this time  Pt understanding

## 2017-11-27 DIAGNOSIS — N189 Chronic kidney disease, unspecified: Secondary | ICD-10-CM | POA: Diagnosis not present

## 2017-11-27 DIAGNOSIS — D539 Nutritional anemia, unspecified: Secondary | ICD-10-CM | POA: Diagnosis not present

## 2017-11-27 DIAGNOSIS — I82492 Acute embolism and thrombosis of other specified deep vein of left lower extremity: Secondary | ICD-10-CM | POA: Diagnosis not present

## 2017-11-27 DIAGNOSIS — M4722 Other spondylosis with radiculopathy, cervical region: Secondary | ICD-10-CM | POA: Diagnosis not present

## 2017-11-27 DIAGNOSIS — Z794 Long term (current) use of insulin: Secondary | ICD-10-CM | POA: Diagnosis not present

## 2017-11-27 DIAGNOSIS — M4316 Spondylolisthesis, lumbar region: Secondary | ICD-10-CM | POA: Diagnosis not present

## 2017-11-27 DIAGNOSIS — E1122 Type 2 diabetes mellitus with diabetic chronic kidney disease: Secondary | ICD-10-CM | POA: Diagnosis not present

## 2017-11-27 DIAGNOSIS — I2510 Atherosclerotic heart disease of native coronary artery without angina pectoris: Secondary | ICD-10-CM | POA: Diagnosis not present

## 2017-11-27 DIAGNOSIS — Z4789 Encounter for other orthopedic aftercare: Secondary | ICD-10-CM | POA: Diagnosis not present

## 2017-11-27 DIAGNOSIS — D696 Thrombocytopenia, unspecified: Secondary | ICD-10-CM | POA: Diagnosis not present

## 2017-11-27 DIAGNOSIS — M1990 Unspecified osteoarthritis, unspecified site: Secondary | ICD-10-CM | POA: Diagnosis not present

## 2017-11-27 DIAGNOSIS — E039 Hypothyroidism, unspecified: Secondary | ICD-10-CM | POA: Diagnosis not present

## 2017-11-27 DIAGNOSIS — Z8673 Personal history of transient ischemic attack (TIA), and cerebral infarction without residual deficits: Secondary | ICD-10-CM | POA: Diagnosis not present

## 2017-11-27 DIAGNOSIS — F329 Major depressive disorder, single episode, unspecified: Secondary | ICD-10-CM | POA: Diagnosis not present

## 2017-11-27 DIAGNOSIS — J150 Pneumonia due to Klebsiella pneumoniae: Secondary | ICD-10-CM | POA: Diagnosis not present

## 2017-11-27 DIAGNOSIS — I129 Hypertensive chronic kidney disease with stage 1 through stage 4 chronic kidney disease, or unspecified chronic kidney disease: Secondary | ICD-10-CM | POA: Diagnosis not present

## 2017-11-27 DIAGNOSIS — Z96651 Presence of right artificial knee joint: Secondary | ICD-10-CM | POA: Diagnosis not present

## 2017-11-28 LAB — SEROTONIN RELEASE ASSAY (SRA)
SRA .2 IU/mL UFH Ser-aCnc: 7 % (ref 0–20)
SRA 100IU/mL UFH Ser-aCnc: 14 % (ref 0–20)

## 2017-11-29 DIAGNOSIS — E1122 Type 2 diabetes mellitus with diabetic chronic kidney disease: Secondary | ICD-10-CM | POA: Diagnosis not present

## 2017-11-29 DIAGNOSIS — M4316 Spondylolisthesis, lumbar region: Secondary | ICD-10-CM | POA: Diagnosis not present

## 2017-11-29 DIAGNOSIS — M4722 Other spondylosis with radiculopathy, cervical region: Secondary | ICD-10-CM | POA: Diagnosis not present

## 2017-11-29 DIAGNOSIS — Z4789 Encounter for other orthopedic aftercare: Secondary | ICD-10-CM | POA: Diagnosis not present

## 2017-11-29 DIAGNOSIS — I82492 Acute embolism and thrombosis of other specified deep vein of left lower extremity: Secondary | ICD-10-CM | POA: Diagnosis not present

## 2017-11-29 DIAGNOSIS — Z8673 Personal history of transient ischemic attack (TIA), and cerebral infarction without residual deficits: Secondary | ICD-10-CM | POA: Diagnosis not present

## 2017-12-04 DIAGNOSIS — Z8673 Personal history of transient ischemic attack (TIA), and cerebral infarction without residual deficits: Secondary | ICD-10-CM | POA: Diagnosis not present

## 2017-12-04 DIAGNOSIS — D509 Iron deficiency anemia, unspecified: Secondary | ICD-10-CM | POA: Diagnosis not present

## 2017-12-04 DIAGNOSIS — J150 Pneumonia due to Klebsiella pneumoniae: Secondary | ICD-10-CM | POA: Diagnosis not present

## 2017-12-04 DIAGNOSIS — R05 Cough: Secondary | ICD-10-CM | POA: Diagnosis not present

## 2017-12-04 DIAGNOSIS — R6510 Systemic inflammatory response syndrome (SIRS) of non-infectious origin without acute organ dysfunction: Secondary | ICD-10-CM | POA: Diagnosis not present

## 2017-12-04 DIAGNOSIS — R0981 Nasal congestion: Secondary | ICD-10-CM | POA: Diagnosis not present

## 2017-12-04 DIAGNOSIS — E1169 Type 2 diabetes mellitus with other specified complication: Secondary | ICD-10-CM | POA: Diagnosis not present

## 2017-12-04 DIAGNOSIS — I10 Essential (primary) hypertension: Secondary | ICD-10-CM | POA: Diagnosis not present

## 2017-12-05 DIAGNOSIS — M4722 Other spondylosis with radiculopathy, cervical region: Secondary | ICD-10-CM | POA: Diagnosis not present

## 2017-12-05 DIAGNOSIS — I82492 Acute embolism and thrombosis of other specified deep vein of left lower extremity: Secondary | ICD-10-CM | POA: Diagnosis not present

## 2017-12-05 DIAGNOSIS — M4316 Spondylolisthesis, lumbar region: Secondary | ICD-10-CM | POA: Diagnosis not present

## 2017-12-05 DIAGNOSIS — E1122 Type 2 diabetes mellitus with diabetic chronic kidney disease: Secondary | ICD-10-CM | POA: Diagnosis not present

## 2017-12-05 DIAGNOSIS — Z4789 Encounter for other orthopedic aftercare: Secondary | ICD-10-CM | POA: Diagnosis not present

## 2017-12-05 DIAGNOSIS — Z8673 Personal history of transient ischemic attack (TIA), and cerebral infarction without residual deficits: Secondary | ICD-10-CM | POA: Diagnosis not present

## 2017-12-07 DIAGNOSIS — M4722 Other spondylosis with radiculopathy, cervical region: Secondary | ICD-10-CM | POA: Diagnosis not present

## 2017-12-07 DIAGNOSIS — Z4789 Encounter for other orthopedic aftercare: Secondary | ICD-10-CM | POA: Diagnosis not present

## 2017-12-07 DIAGNOSIS — I82492 Acute embolism and thrombosis of other specified deep vein of left lower extremity: Secondary | ICD-10-CM | POA: Diagnosis not present

## 2017-12-07 DIAGNOSIS — E1122 Type 2 diabetes mellitus with diabetic chronic kidney disease: Secondary | ICD-10-CM | POA: Diagnosis not present

## 2017-12-07 DIAGNOSIS — Z8673 Personal history of transient ischemic attack (TIA), and cerebral infarction without residual deficits: Secondary | ICD-10-CM | POA: Diagnosis not present

## 2017-12-07 DIAGNOSIS — M4316 Spondylolisthesis, lumbar region: Secondary | ICD-10-CM | POA: Diagnosis not present

## 2017-12-11 DIAGNOSIS — Z4789 Encounter for other orthopedic aftercare: Secondary | ICD-10-CM | POA: Diagnosis not present

## 2017-12-11 DIAGNOSIS — I82492 Acute embolism and thrombosis of other specified deep vein of left lower extremity: Secondary | ICD-10-CM | POA: Diagnosis not present

## 2017-12-11 DIAGNOSIS — E1122 Type 2 diabetes mellitus with diabetic chronic kidney disease: Secondary | ICD-10-CM | POA: Diagnosis not present

## 2017-12-11 DIAGNOSIS — M4316 Spondylolisthesis, lumbar region: Secondary | ICD-10-CM | POA: Diagnosis not present

## 2017-12-11 DIAGNOSIS — M4722 Other spondylosis with radiculopathy, cervical region: Secondary | ICD-10-CM | POA: Diagnosis not present

## 2017-12-11 DIAGNOSIS — Z8673 Personal history of transient ischemic attack (TIA), and cerebral infarction without residual deficits: Secondary | ICD-10-CM | POA: Diagnosis not present

## 2017-12-14 DIAGNOSIS — Z4789 Encounter for other orthopedic aftercare: Secondary | ICD-10-CM | POA: Diagnosis not present

## 2017-12-14 DIAGNOSIS — M4722 Other spondylosis with radiculopathy, cervical region: Secondary | ICD-10-CM | POA: Diagnosis not present

## 2017-12-14 DIAGNOSIS — E1122 Type 2 diabetes mellitus with diabetic chronic kidney disease: Secondary | ICD-10-CM | POA: Diagnosis not present

## 2017-12-14 DIAGNOSIS — I82492 Acute embolism and thrombosis of other specified deep vein of left lower extremity: Secondary | ICD-10-CM | POA: Diagnosis not present

## 2017-12-14 DIAGNOSIS — Z8673 Personal history of transient ischemic attack (TIA), and cerebral infarction without residual deficits: Secondary | ICD-10-CM | POA: Diagnosis not present

## 2017-12-14 DIAGNOSIS — M4316 Spondylolisthesis, lumbar region: Secondary | ICD-10-CM | POA: Diagnosis not present

## 2017-12-19 DIAGNOSIS — M48062 Spinal stenosis, lumbar region with neurogenic claudication: Secondary | ICD-10-CM | POA: Diagnosis not present

## 2017-12-19 DIAGNOSIS — Z6833 Body mass index (BMI) 33.0-33.9, adult: Secondary | ICD-10-CM | POA: Diagnosis not present

## 2017-12-19 DIAGNOSIS — I10 Essential (primary) hypertension: Secondary | ICD-10-CM | POA: Diagnosis not present

## 2017-12-19 DIAGNOSIS — M4800 Spinal stenosis, site unspecified: Secondary | ICD-10-CM | POA: Diagnosis not present

## 2018-01-03 DIAGNOSIS — R1013 Epigastric pain: Secondary | ICD-10-CM | POA: Diagnosis not present

## 2018-01-03 DIAGNOSIS — E785 Hyperlipidemia, unspecified: Secondary | ICD-10-CM | POA: Diagnosis not present

## 2018-01-03 DIAGNOSIS — Z8673 Personal history of transient ischemic attack (TIA), and cerebral infarction without residual deficits: Secondary | ICD-10-CM | POA: Diagnosis not present

## 2018-01-03 DIAGNOSIS — I10 Essential (primary) hypertension: Secondary | ICD-10-CM | POA: Diagnosis not present

## 2018-01-03 DIAGNOSIS — G720 Drug-induced myopathy: Secondary | ICD-10-CM | POA: Diagnosis not present

## 2018-01-03 DIAGNOSIS — I82402 Acute embolism and thrombosis of unspecified deep veins of left lower extremity: Secondary | ICD-10-CM | POA: Diagnosis not present

## 2018-01-08 ENCOUNTER — Emergency Department (HOSPITAL_COMMUNITY): Payer: Medicare Other

## 2018-01-08 ENCOUNTER — Emergency Department (HOSPITAL_COMMUNITY)
Admission: EM | Admit: 2018-01-08 | Discharge: 2018-01-08 | Disposition: A | Discharge status: Home / Self Care | Payer: Medicare Other | Attending: Emergency Medicine | Admitting: Emergency Medicine

## 2018-01-08 ENCOUNTER — Encounter (HOSPITAL_COMMUNITY): Payer: Self-pay | Admitting: *Deleted

## 2018-01-08 DIAGNOSIS — R079 Chest pain, unspecified: Secondary | ICD-10-CM | POA: Diagnosis not present

## 2018-01-08 DIAGNOSIS — Z8639 Personal history of other endocrine, nutritional and metabolic disease: Secondary | ICD-10-CM | POA: Diagnosis not present

## 2018-01-08 DIAGNOSIS — Z7982 Long term (current) use of aspirin: Secondary | ICD-10-CM | POA: Diagnosis not present

## 2018-01-08 DIAGNOSIS — Z7984 Long term (current) use of oral hypoglycemic drugs: Secondary | ICD-10-CM | POA: Insufficient documentation

## 2018-01-08 DIAGNOSIS — Z96653 Presence of artificial knee joint, bilateral: Secondary | ICD-10-CM | POA: Insufficient documentation

## 2018-01-08 DIAGNOSIS — Z7901 Long term (current) use of anticoagulants: Secondary | ICD-10-CM | POA: Diagnosis not present

## 2018-01-08 DIAGNOSIS — I110 Hypertensive heart disease with heart failure: Secondary | ICD-10-CM | POA: Diagnosis not present

## 2018-01-08 DIAGNOSIS — R109 Unspecified abdominal pain: Secondary | ICD-10-CM | POA: Diagnosis not present

## 2018-01-08 DIAGNOSIS — I5030 Unspecified diastolic (congestive) heart failure: Secondary | ICD-10-CM | POA: Insufficient documentation

## 2018-01-08 DIAGNOSIS — K5900 Constipation, unspecified: Secondary | ICD-10-CM | POA: Diagnosis not present

## 2018-01-08 DIAGNOSIS — E039 Hypothyroidism, unspecified: Secondary | ICD-10-CM | POA: Insufficient documentation

## 2018-01-08 DIAGNOSIS — R1010 Upper abdominal pain, unspecified: Secondary | ICD-10-CM

## 2018-01-08 DIAGNOSIS — E1165 Type 2 diabetes mellitus with hyperglycemia: Secondary | ICD-10-CM | POA: Diagnosis not present

## 2018-01-08 DIAGNOSIS — Z79899 Other long term (current) drug therapy: Secondary | ICD-10-CM | POA: Insufficient documentation

## 2018-01-08 DIAGNOSIS — R1013 Epigastric pain: Secondary | ICD-10-CM | POA: Diagnosis not present

## 2018-01-08 DIAGNOSIS — R000 Tachycardia, unspecified: Secondary | ICD-10-CM | POA: Diagnosis not present

## 2018-01-08 DIAGNOSIS — E119 Type 2 diabetes mellitus without complications: Secondary | ICD-10-CM | POA: Diagnosis not present

## 2018-01-08 DIAGNOSIS — Z794 Long term (current) use of insulin: Secondary | ICD-10-CM | POA: Diagnosis not present

## 2018-01-08 LAB — BASIC METABOLIC PANEL
ANION GAP: 11 (ref 5–15)
BUN: 10 mg/dL (ref 6–20)
CO2: 25 mmol/L (ref 22–32)
CREATININE: 0.99 mg/dL (ref 0.44–1.00)
Calcium: 10.1 mg/dL (ref 8.9–10.3)
Chloride: 102 mmol/L (ref 101–111)
GFR calc non Af Amer: 58 mL/min — ABNORMAL LOW (ref >60)
Glucose, Bld: 93 mg/dL (ref 65–99)
Potassium: 3.9 mmol/L (ref 3.5–5.1)
Sodium: 138 mmol/L (ref 135–145)

## 2018-01-08 LAB — HEPATIC FUNCTION PANEL
ALT: 11 U/L — AB (ref 14–54)
AST: 18 U/L (ref 15–41)
Albumin: 3.8 g/dL (ref 3.5–5.0)
Alkaline Phosphatase: 92 U/L (ref 38–126)
Total Bilirubin: 0.5 mg/dL (ref 0.3–1.2)
Total Protein: 7.5 g/dL (ref 6.5–8.1)

## 2018-01-08 LAB — URINALYSIS, ROUTINE W REFLEX MICROSCOPIC
Bilirubin Urine: NEGATIVE
KETONES UR: NEGATIVE mg/dL
NITRITE: NEGATIVE
PROTEIN: NEGATIVE mg/dL
Specific Gravity, Urine: >1.046 — ABNORMAL HIGH (ref 1.005–1.030)
pH: 5 (ref 5.0–8.0)

## 2018-01-08 LAB — I-STAT TROPONIN, ED: Troponin i, poc: 0 ng/mL (ref 0.00–0.08)

## 2018-01-08 LAB — CBC
HCT: 45.9 % (ref 36.0–46.0)
HEMOGLOBIN: 14 g/dL (ref 12.0–15.0)
MCH: 23.9 pg — AB (ref 26.0–34.0)
MCHC: 30.5 g/dL (ref 30.0–36.0)
MCV: 78.3 fL (ref 78.0–100.0)
Platelets: 474 10*3/uL — ABNORMAL HIGH (ref 150–400)
RBC: 5.86 MIL/uL — AB (ref 3.87–5.11)
RDW: 19.6 % — ABNORMAL HIGH (ref 11.5–15.5)
WBC: 9.8 10*3/uL (ref 4.0–10.5)

## 2018-01-08 LAB — LIPASE, BLOOD: Lipase: 21 U/L (ref 11–51)

## 2018-01-08 MED ORDER — MORPHINE SULFATE (PF) 4 MG/ML IV SOLN
4.0000 mg | Freq: Once | INTRAVENOUS | Status: AC
  Administered 2018-01-08: 4 mg via INTRAVENOUS
  Filled 2018-01-08: qty 1

## 2018-01-08 MED ORDER — IOHEXOL 300 MG/ML  SOLN
100.0000 mL | Freq: Once | INTRAMUSCULAR | Status: AC | PRN
  Administered 2018-01-08: 100 mL via INTRAVENOUS

## 2018-01-08 MED ORDER — DIPHENHYDRAMINE HCL 50 MG/ML IJ SOLN
12.5000 mg | Freq: Once | INTRAMUSCULAR | Status: AC
  Administered 2018-01-08: 12.5 mg via INTRAVENOUS
  Filled 2018-01-08: qty 1

## 2018-01-08 MED ORDER — ONDANSETRON HCL 4 MG/2ML IJ SOLN
4.0000 mg | Freq: Once | INTRAMUSCULAR | Status: AC
  Administered 2018-01-08: 4 mg via INTRAVENOUS
  Filled 2018-01-08: qty 2

## 2018-01-08 NOTE — ED Notes (Signed)
Lab called to add on HFP.

## 2018-01-08 NOTE — Discharge Instructions (Addendum)
You can use 8 capfuls of Miralax in 32oz of Gatorade/Powerade up to twice a day.

## 2018-01-08 NOTE — ED Notes (Signed)
Patient transported to CT 

## 2018-01-08 NOTE — ED Triage Notes (Signed)
Pt in c/o upper abdominal pain for the last few days, states its like a tightness around her abdomen, also nausea and diaphoresis, also some shortness of breath when pain occurs

## 2018-01-08 NOTE — ED Provider Notes (Signed)
[name] Provider Note   CSN: [id number] Arrival date & time: 01/08/18  1623     History   Chief Complaint Chief Complaint  Patient presents with  . Abdominal Pain    HPI [name] is a 67 y.o. female.  The history is provided by the patient.   67 yo F with PMHx of colectomy, appendectomy, cholecystectomy, DM who presents with severe, intermittent epigastric pain x 1 wk, now constant for last 24 hours. Gradually worsening. Described as "someone cutting me." Radiates to back, describes pain as coming in waves. Nothing relieves pain. Accompanied by nausea, no vomiting, abdominal distension. BM 2 days ago. Denies fever or new medications. Denies EtOH use. Denies urinary symptoms. Denies CP, SOB. No similar symptoms in the past.   Past Medical History:  Diagnosis Date  . Anemia    iron deficiency  . Arthritis   . Blood transfusion   . Cervical spondylosis with myelopathy and radiculopathy   . Depression   . Diabetes mellitus    Type II  . Diastolic dysfunction   . Diverticulitis   . DJD (degenerative joint disease)   . DVT (deep venous thrombosis) (HCC)    times 2 lower leg  . Endometriosis   . Family history of adverse reaction to anesthesia    mom and dad PONV  . Fibromyalgia   . GERD (gastroesophageal reflux disease)    occ  . Hiatal hernia   . History of kidney stones   . Hyperlipidemia   . Hypertension   . Hypothyroidism   . Insomnia   . Mild aortic insufficiency    by echo 04/2013  . Osteoarthritis    back and knee  . Ovarian cyst   . PONV (postoperative nausea and vomiting)   . RLS (restless legs syndrome)   . Thoracic compression fracture ([place])   . Uterine fibroid   . UTI (lower urinary tract infection)   . Villous adenoma of right colon 04/08/2016  . Vitamin D deficiency disease     Patient Active Problem List   Diagnosis Date Noted  . Cerebral thrombosis with cerebral infarction 11/22/2017  . Acute  encephalopathy   . DKA (diabetic ketoacidoses) ([place]) 11/18/2017  . Diabetic ketoacidosis ([place]) 11/18/2017  . Acute respiratory failure with hypoxia ([place])   . Acute renal failure ([employer])   . Endotracheal tube present   . Septic shock ([place])   . Spondylolisthesis of lumbar region 11/15/2017  . PAC (premature atrial contraction) 09/04/2017  . Cervical spondylosis with radiculopathy 03/23/2017  . Hypoxia   . Villous adenoma of right colon 04/08/2016  . Leukocytosis 04/08/2016  . Anemia   . Diastolic dysfunction   . Mild aortic insufficiency   . Other nonspecific abnormal cardiovascular system function study 05/03/2013  . SOB (shortness of breath) 05/01/2013  . Atypical chest pain 02/28/2013  . Dysphagia 02/28/2013  . UTI (lower urinary tract infection) 07/06/2012  . Intractable nausea and vomiting 07/06/2012  . Blood loss anemia 07/06/2012  . Acute kidney injury ([place]) 07/06/2012  . Epigastric abdominal pain 06/27/2012  . Type II diabetes mellitus ([place]) 07/28/2011  . Hypothyroidism 07/28/2011  . Hypertension 07/28/2011  . Hepatic steatosis 07/28/2011    Past Surgical History:  Procedure Laterality Date  . ANTERIOR CERVICAL DECOMP/DISCECTOMY FUSION N/A 03/23/2017   Procedure: ANTERIOR CERVICAL DECOMPRESSION/DISCECTOMY FUSION, INTERBODY PROSTHESIS,PLATE CERVICAL THREE- CERVICAL FOUR, CERVICAL FOUR- CERVICAL FIVE, CERVICAL FIVE- CERVICAL SIX;  Surgeon: Newman Pies, MD;  Location: [facility];  Service: Neurosurgery;  Laterality: N/A;  . APPENDECTOMY    . BACK SURGERY  ,2005   April  2013 - [facility] fusion[facility]  . BREAST SURGERY     breast reduction  . CARDIAC CATHETERIZATION  04/2013   normal coronary arteries and normal LVF  . CARPAL TUNNEL RELEASE  06/05/2012   Procedure: CARPAL TUNNEL RELEASE;  Surgeon: Cammie Sickle., MD;  Location: [facility];  Service: Orthopedics;  Laterality: Left;  . CESAREAN SECTION     x 2  . CHOLECYSTECTOMY    . COLECTOMY  04/08/2016    . DILATION AND CURETTAGE OF UTERUS    . DORSAL COMPARTMENT RELEASE  06/05/2012   Procedure: RELEASE DORSAL COMPARTMENT (DEQUERVAIN);  Surgeon: Cammie Sickle., MD;  Location: [facility];  Service: Orthopedics;  Laterality: Left;  Excision of mixoid cyst also  . EYE SURGERY     cataracts bilateral  . JOINT REPLACEMENT     Bilateral knee  . KNEE ARTHROPLASTY  09   lft partial  . KNEE ARTHROPLASTY     rt  . LAPAROSCOPIC PARTIAL COLECTOMY N/A 04/08/2016   Procedure: LAPAROSCOPIC ASSISTED ASCENDING COLECTOMY POSSIBLE OPEN COLECTOMY;  Surgeon: Fanny Skates, MD;  Location: [place] [facility];  Service: General;  Laterality: N/A;  . orthopedic surgeries     multiple  . REDUCTION MAMMAPLASTY Bilateral   . SHOULDER OPEN ROTATOR CUFF REPAIR     rt and lft  . TEE WITHOUT CARDIOVERSION N/A 11/23/2017   Procedure: TRANSESOPHAGEAL ECHOCARDIOGRAM (TEE);  Surgeon: Jerline Pain, MD;  Location[facility] ENDOSCOPY;  Service: Cardiovascular;  Laterality: N/A;  . TUBAL LIGATION     btsp     OB History    Gravida  2   Para  2   Term  2   Preterm      AB      Living  2     SAB      TAB      Ectopic      Multiple      Live Births               Home Medications    Prior to Admission medications   Medication Sig Start Date End Date Taking? Authorizing Provider  acetaminophen (TYLENOL) 500 MG tablet Take 1,000 mg by mouth every 8 (eight) hours as needed for mild pain or headache.   Yes [provider]  amLODipine (NORVASC) 5 MG tablet Take 5 mg by mouth daily.   Yes [provider]  aspirin 325 MG tablet Take 1 tablet (325 mg total) by mouth daily. 11/26/17  Yes Patrecia Pour, MD  clobetasol (TEMOVATE) 0.05 % external solution Apply 1 application topically See admin instructions. Apply twice daily to scalp and ear as needed for itching/rash (do not apply to face/groin/underarms) 07/13/16  Yes [provider]  Cyanocobalamin (B-12) 2500 MCG TABS Take 2,500  mcg by mouth daily.   Yes [provider]  cyclobenzaprine (FLEXERIL) 10 MG tablet Take 1 tablet (10 mg total) by mouth 3 (three) times daily as needed for muscle spasms. 03/24/17  Yes Costella, [place] Mink, PA-C  docusate sodium (COLACE) 100 MG capsule Take 1 capsule (100 mg total) by mouth 2 (two) times daily. 11/17/17  Yes Newman Pies, MD  DULoxetine (CYMBALTA) 60 MG capsule Take 120 mg by mouth daily.    Yes [provider]  ferrous sulfate 325 (65 FE) MG tablet Take 1 tablet (325  mg total) by mouth 2 (two) times daily with a meal. 11/17/17  Yes Newman Pies, MD  fluticasone [facility]) 50 MCG/ACT nasal spray Place 2 sprays into both nostrils daily as needed for allergies or rhinitis.   Yes [provider]  furosemide (LASIX) 20 MG tablet Take 20 mg by mouth daily as needed for fluid or edema.    Yes [provider]  JARDIANCE 10 MG TABS tablet Take 10 mg by mouth daily. 07/18/17  Yes [provider]  levothyroxine (SYNTHROID, LEVOTHROID) 150 MCG tablet Take 150 mcg by mouth daily before breakfast. 10/10/17  Yes [provider]  NOVOLIN 70/30 RELION (70-30) 100 UNIT/ML injection Inject 35 Units into the skin 2 (two) times daily. 11/25/17  Yes Patrecia Pour, MD  nystatin (MYCOSTATIN) 100000 UNIT/ML suspension Use as directed 5 mLs in the mouth or throat 4 (four) times daily as needed (for tongue burning/mouth sores.).  08/22/17  Yes [provider]  oxyCODONE (OXY IR/ROXICODONE) 5 MG immediate release tablet Take 1 tablet (5 mg total) by mouth every 4 (four) hours as needed for moderate pain ((score 4 to 6)). 11/17/17  Yes Newman Pies, MD  pantoprazole (PROTONIX) 40 MG tablet Take 40 mg by mouth 2 (two) times daily.   Yes [provider]  Probiotic Product (PROBIOTIC PO) Take 1 capsule by mouth daily.   Yes [provider]  promethazine (PHENERGAN) 25 MG tablet Take 25 mg by mouth every 8 (eight) hours as needed for  nausea or vomiting.   Yes [provider]  rivaroxaban (XARELTO) 20 MG TABS tablet Take 20 mg by mouth daily.   Yes [provider]  temazepam (RESTORIL) 15 MG capsule Take 15 mg by mouth at bedtime.  10/10/17  Yes [provider]  Rivaroxaban 15 & 20 MG TBPK Take as directed on package: Start with one 15mg  tablet by mouth twice a day with food. On Day 22, switch to one 20mg  tablet once a day with food. Patient not taking: Reported on 01/08/2018 11/25/17   Patrecia Pour, MD    Family History Family History  Problem Relation Age of Onset  . Heart attack Father        66s  . Hypothyroidism Father   . Diabetes type II Father   . Diabetes type II Mother   . Hypertension Mother   . Breast cancer Mother 62  . Hypothyroidism Brother   . Breast cancer Maternal Aunt   . Breast cancer Cousin   . Breast cancer Maternal Aunt   . Breast cancer Cousin     Social History Social History   Tobacco Use  . Smoking status: Never Smoker  . Smokeless tobacco: Never Used  Substance Use Topics  . Alcohol use: No  . Drug use: No     Allergies   Codeine; Ambien [zolpidem tartrate]; Crestor [rosuvastatin]; Lipitor [atorvastatin]; Morphine and related; Penicillins; Statins; Dilaudid [hydromorphone hcl]; Heparin; Amitriptyline; Ceftin [cefuroxime axetil]; Lovaza [omega-3-acid ethyl esters]; Lunesta [eszopiclone]; Lyrica [pregabalin]; and Metformin and related   Review of Systems Review of Systems  Constitutional: Negative for chills and fever.  HENT: Negative for ear pain and sore throat.   Eyes: Negative for pain and visual disturbance.  Respiratory: Negative for cough and shortness of breath.   Cardiovascular: Negative for chest pain and palpitations.  Gastrointestinal: Positive for abdominal distention, abdominal pain and nausea. Negative for vomiting.  Genitourinary: Negative for dysuria and hematuria.  Musculoskeletal: Negative for arthralgias and back pain.  Skin:  Negative for color change and rash.  Neurological: Negative for seizures and syncope.  All other systems reviewed and are negative.    Physical Exam Updated Vital Signs BP (!) 151/79   Pulse 66   Temp 98 F (36.7 C) (Oral)   Resp 19   SpO2 94%   Physical Exam  Constitutional: She appears well-developed and well-nourished. No distress.  HENT:  Head: Normocephalic and atraumatic.  Eyes: Conjunctivae and EOM are normal. No scleral icterus.  Neck: Neck supple.  Cardiovascular: Regular rhythm. Tachycardia present.  No murmur heard. Pulmonary/Chest: Effort normal and breath sounds normal. No respiratory distress.  Abdominal: Soft. Normal appearance. There is tenderness in the epigastric area and left upper quadrant.    Musculoskeletal: She exhibits no edema.  Neurological: She is alert.  Skin: Skin is warm and dry.  Psychiatric: She has a normal mood and affect.  Nursing note and vitals reviewed.    ED Treatments / Results  Labs (all labs ordered are listed, but only abnormal results are displayed) Labs Reviewed  BASIC METABOLIC PANEL - Abnormal; Notable for the following components:      Result Value   GFR calc non Af Amer 58 (*)    All other components within normal limits  CBC - Abnormal; Notable for the following components:   RBC 5.86 (*)    MCH 23.9 (*)    RDW 19.6 (*)    Platelets 474 (*)    All other components within normal limits  URINALYSIS, ROUTINE W REFLEX MICROSCOPIC - Abnormal; Notable for the following components:   APPearance HAZY (*)    Specific Gravity, Urine >1.046 (*)    Glucose, UA >=500 (*)    Hgb urine dipstick SMALL (*)    Leukocytes, UA LARGE (*)    RBC / HPF >50 (*)    WBC, UA >50 (*)    Bacteria, UA RARE (*)    All other components within normal limits  HEPATIC FUNCTION PANEL - Abnormal; Notable for the following components:   ALT 11 (*)    Bilirubin, Direct <0.1 (*)    All other components within normal limits  URINE CULTURE    LIPASE, BLOOD  I-STAT TROPONIN, ED    EKG EKG Interpretation  Date/Time:  Monday January 08 2018 16:43:00 EDT Ventricular Rate:  109 PR Interval:  154 QRS Duration: 78 QT Interval:  346 QTC Calculation: 465 R Axis:   -41 Text Interpretation:  Sinus tachycardia with Premature atrial complexes Left axis deviation Left ventricular hypertrophy with repolarization abnormality Cannot rule out Septal infarct , age undetermined Abnormal ECG PACs new from previous; new T wave inversions V2, aVL Confirmed by Theotis Burrow [id number]) on 01/08/2018 5:09:13 PM   Radiology Dg Chest 2 View  Result Date: 01/08/2018 CLINICAL DATA:  Acute chest pain for 1 day. EXAM: CHEST - 2 VIEW COMPARISON:  11/20/2017 and prior radiograph FINDINGS: The cardiomediastinal silhouette is unremarkable. There is no evidence of focal airspace disease, pulmonary edema, suspicious pulmonary nodule/mass, pleural effusion, or pneumothorax. No acute bony abnormalities are identified. Cervical and lumbar spine surgical hardware again noted. IMPRESSION: No active cardiopulmonary disease. Electronically Signed   By: Margarette Canada M.D.   On: 01/08/2018 17:44   Ct Abdomen Pelvis W Contrast  Result Date: 01/08/2018 CLINICAL DATA:  Epigastric abdominal pain. Nausea. Cholecystectomy. Appendectomy. Partial colectomy. EXAM: CT ABDOMEN AND PELVIS WITH CONTRAST TECHNIQUE: Multidetector CT imaging of the abdomen and pelvis was performed using the standard protocol following bolus administration  of intravenous contrast. CONTRAST:  169mL OMNIPAQUE IOHEXOL 300 MG/ML  SOLN COMPARISON:  11/14/2016 FINDINGS: Lower chest: Clear lung bases. Mild cardiomegaly, without pericardial effusion. Hepatobiliary: Normal liver. Cholecystectomy, without biliary ductal dilatation. Pancreas: Normal, without mass or ductal dilatation. Spleen: Normal in size, without focal abnormality. Adrenals/Urinary Tract: Normal right adrenal gland. Mild left adrenal thickening. Beam hardening  artifact from lumbar spine fixation. Normal kidneys, without hydronephrosis. Normal urinary bladder. Stomach/Bowel: Distal gastric underdistention. Large amount of stool within the rectum and sigmoid. Fluid-filled colon more proximally. Partial right hemicolectomy. Normal small bowel caliber. Periampullary duodenal diverticulum incidentally noted. Vascular/Lymphatic: Advanced aortic and branch vessel atherosclerosis. No abdominopelvic adenopathy. Reproductive: Normal uterus and adnexa. Other: No significant free fluid.  No free intraperitoneal air. Musculoskeletal: L2-4 trans pedicle screw fixation. Mild superior endplate compression deformity at T12 is similar. IMPRESSION: 1.  No acute process in the abdomen or pelvis. 2. Large distal colonic stool burden, possibly representing constipation. 3.  Aortic Atherosclerosis (ICD10-I70.0). 4. T12 compression deformity, mild and chronic. Electronically Signed   By: Abigail Miyamoto M.D.   On: 01/08/2018 19:52    Procedures Procedures (including critical care time)  Medications Ordered in ED Medications  morphine 4 MG/ML injection 4 mg (4 mg Intravenous Given 01/08/18 1915)  ondansetron (ZOFRAN) injection 4 mg (4 mg Intravenous Given 01/08/18 1915)  diphenhydrAMINE (BENADRYL) injection 12.5 mg (12.5 mg Intravenous Given 01/08/18 1915)  iohexol (OMNIPAQUE) 300 MG/ML solution 100 mL (100 mLs Intravenous Contrast Given 01/08/18 1927)     Initial Impression / Assessment and Plan / ED Course  I have reviewed the triage vital signs and the nursing notes.  Pertinent labs & imaging results that were available during my care of the patient were reviewed by me and considered in my medical decision making (see chart for details).     KANIESHA BARILE is a 67 y.o. female with PMHx of multiple abd surgeries in the past who p/w epigastric abd pain x 1 wk. Reviewed and confirmed nursing documentation for past medical history, family history, social history. VS afebrile, HR 114,  otherwise wnl. Exam remarkable for epigastric and LUQ tenderness. Ddx includes SBO, considering gastritis, pancreatitis, duodenitis, ACS.   IV morphine, zofran, IV benadryl given. BMP wnl. LFTs wnl. CBC with mild thrombocytosis with plts 474, otherwise wnl. Lipase wnl. UA with large leuks, >50 RBCs, >50 WBCs, no nitrates, rare bacteria. No urinary symptoms, will sent urine culture and not treat as UTI at this time. EKG with new, NS t wave changes but no ischemic changes. CXR neg. CT abd/pelvis with no acute process in abd/pelvis, large colonic stool burden. Discussed use of Miralax and enemas with patient.   Old records reviewed. Labs reviewed by me and used in the medical decision making.  Imaging viewed and interpreted by me and used in the medical decision making (formal interpretation from radiologist). EKG reviewed by me and used in the medical decision making. D/c home in stable condition, return precautions discussed. Patient agreeable with plan for d/c home.    Final Clinical Impressions(s) / ED Diagnoses   Final diagnoses:  Pain of upper abdomen  Constipation, unspecified constipation type    ED Discharge Orders    None       Norm Salt, MD 01/08/18 2155    Rex Kras Wenda Overland, MD 01/10/18 1447

## 2018-01-08 NOTE — ED Provider Notes (Signed)
Patient placed in Quick Look pathway, seen and evaluated   Chief Complaint: Epigastric abdominal pain  HPI:   Patient presenting to the ED with episodes of epigastric abdominal pain/tightness.  She states she feels a wave of tightness that goes across her upper abdomen with associated nausea and diaphoresis.  She states she feels like she cannot quite catch her breath during the episodes.  States she does have a history of DVT, though is compliant with Xarelto.  No other abdominal complaints.  ROS: + Abdominal pain,+ nausea,+ diaphoresis  Physical Exam:   Gen: No distress  Neuro: Awake and Alert  Skin: Warm    Focused Exam: She appears uncomfortable and slightly tachypneic.  Heart sounds normal.  Abdomen is soft though with some epigastric tenderness to palpation.   Initiation of care has begun. The patient has been counseled on the process, plan, and necessity for staying for the completion/evaluation, and the remainder of the medical screening examination    [name], [place] N, PA-C 01/08/18 [place], Morgan Overland, MD 01/10/18 1441

## 2018-01-10 LAB — URINE CULTURE

## 2018-01-21 ENCOUNTER — Emergency Department (HOSPITAL_COMMUNITY)
Admission: EM | Admit: 2018-01-21 | Discharge: 2018-01-22 | Disposition: A | Discharge status: Home / Self Care | Payer: Medicare Other | Attending: Emergency Medicine | Admitting: Emergency Medicine

## 2018-01-21 ENCOUNTER — Encounter (HOSPITAL_COMMUNITY): Payer: Self-pay

## 2018-01-21 DIAGNOSIS — I110 Hypertensive heart disease with heart failure: Secondary | ICD-10-CM | POA: Insufficient documentation

## 2018-01-21 DIAGNOSIS — Z794 Long term (current) use of insulin: Secondary | ICD-10-CM | POA: Diagnosis not present

## 2018-01-21 DIAGNOSIS — E119 Type 2 diabetes mellitus without complications: Secondary | ICD-10-CM | POA: Insufficient documentation

## 2018-01-21 DIAGNOSIS — I5030 Unspecified diastolic (congestive) heart failure: Secondary | ICD-10-CM | POA: Diagnosis not present

## 2018-01-21 DIAGNOSIS — R1013 Epigastric pain: Secondary | ICD-10-CM | POA: Diagnosis not present

## 2018-01-21 DIAGNOSIS — Z7982 Long term (current) use of aspirin: Secondary | ICD-10-CM | POA: Insufficient documentation

## 2018-01-21 DIAGNOSIS — Z7901 Long term (current) use of anticoagulants: Secondary | ICD-10-CM | POA: Insufficient documentation

## 2018-01-21 DIAGNOSIS — R000 Tachycardia, unspecified: Secondary | ICD-10-CM | POA: Diagnosis not present

## 2018-01-21 DIAGNOSIS — Z79899 Other long term (current) drug therapy: Secondary | ICD-10-CM | POA: Insufficient documentation

## 2018-01-21 DIAGNOSIS — R109 Unspecified abdominal pain: Secondary | ICD-10-CM

## 2018-01-21 DIAGNOSIS — N390 Urinary tract infection, site not specified: Secondary | ICD-10-CM | POA: Insufficient documentation

## 2018-01-21 DIAGNOSIS — E039 Hypothyroidism, unspecified: Secondary | ICD-10-CM | POA: Insufficient documentation

## 2018-01-21 DIAGNOSIS — R197 Diarrhea, unspecified: Secondary | ICD-10-CM | POA: Diagnosis not present

## 2018-01-21 LAB — CBC
HCT: 47.6 % — ABNORMAL HIGH (ref 36.0–46.0)
HEMOGLOBIN: 14.4 g/dL (ref 12.0–15.0)
MCH: 24.2 pg — ABNORMAL LOW (ref 26.0–34.0)
MCHC: 30.3 g/dL (ref 30.0–36.0)
MCV: 80.1 fL (ref 78.0–100.0)
PLATELETS: 318 10*3/uL (ref 150–400)
RBC: 5.94 MIL/uL — ABNORMAL HIGH (ref 3.87–5.11)
RDW: 17.2 % — AB (ref 11.5–15.5)
WBC: 10.7 10*3/uL — ABNORMAL HIGH (ref 4.0–10.5)

## 2018-01-21 NOTE — ED Triage Notes (Signed)
Pt states that this afternoon she began to have upper abd pain that radiates to her chest, back with n/v/diaphoresis. Seen her last week for the same and reports this is worse.

## 2018-01-22 ENCOUNTER — Emergency Department (HOSPITAL_COMMUNITY): Payer: Medicare Other

## 2018-01-22 DIAGNOSIS — N390 Urinary tract infection, site not specified: Secondary | ICD-10-CM | POA: Diagnosis not present

## 2018-01-22 DIAGNOSIS — R197 Diarrhea, unspecified: Secondary | ICD-10-CM | POA: Diagnosis not present

## 2018-01-22 LAB — URINALYSIS, ROUTINE W REFLEX MICROSCOPIC
BILIRUBIN URINE: NEGATIVE
Glucose, UA: >=500 mg/dL — AB
HGB URINE DIPSTICK: NEGATIVE
Ketones, ur: NEGATIVE mg/dL
Nitrite: NEGATIVE
PROTEIN: NEGATIVE mg/dL
Specific Gravity, Urine: 1.028 (ref 1.005–1.030)
pH: 5 (ref 5.0–8.0)

## 2018-01-22 LAB — COMPREHENSIVE METABOLIC PANEL
ALBUMIN: 3.6 g/dL (ref 3.5–5.0)
ALK PHOS: 91 U/L (ref 38–126)
ALT: 10 U/L — AB (ref 14–54)
AST: 16 U/L (ref 15–41)
Anion gap: 11 (ref 5–15)
BUN: 7 mg/dL (ref 6–20)
CALCIUM: 9.5 mg/dL (ref 8.9–10.3)
CHLORIDE: 105 mmol/L (ref 101–111)
CO2: 22 mmol/L (ref 22–32)
CREATININE: 0.92 mg/dL (ref 0.44–1.00)
GFR calc Af Amer: >60 mL/min (ref >60)
GFR calc non Af Amer: >60 mL/min (ref >60)
GLUCOSE: 61 mg/dL — AB (ref 65–99)
Potassium: 3.5 mmol/L (ref 3.5–5.1)
Sodium: 138 mmol/L (ref 135–145)
Total Bilirubin: 0.3 mg/dL (ref 0.3–1.2)
Total Protein: 7 g/dL (ref 6.5–8.1)

## 2018-01-22 LAB — TROPONIN I: Troponin I: <0.03 ng/mL (ref <0.03)

## 2018-01-22 LAB — LIPASE, BLOOD: LIPASE: 22 U/L (ref 11–51)

## 2018-01-22 LAB — CBG MONITORING, ED: GLUCOSE-CAPILLARY: 164 mg/dL — AB (ref 65–99)

## 2018-01-22 MED ORDER — NITROFURANTOIN MONOHYD MACRO 100 MG PO CAPS: 100.0000 mg | ORAL_CAPSULE | Freq: Two times a day (BID) | ORAL | 0 refills | Status: DC

## 2018-01-22 MED ORDER — FENTANYL CITRATE (PF) 100 MCG/2ML IJ SOLN
50.0000 ug | Freq: Once | INTRAMUSCULAR | Status: AC
  Administered 2018-01-22: 50 ug via INTRAVENOUS
  Filled 2018-01-22: qty 2

## 2018-01-22 MED ORDER — DICYCLOMINE HCL 10 MG/ML IM SOLN
20.0000 mg | Freq: Once | INTRAMUSCULAR | Status: AC
  Administered 2018-01-22: 20 mg via INTRAMUSCULAR
  Filled 2018-01-22: qty 2

## 2018-01-22 MED ORDER — METOCLOPRAMIDE HCL 5 MG/ML IJ SOLN
10.0000 mg | INTRAMUSCULAR | Status: AC
  Administered 2018-01-22: 10 mg via INTRAVENOUS
  Filled 2018-01-22: qty 2

## 2018-01-22 MED ORDER — SODIUM CHLORIDE 0.9 % IV BOLUS
1000.0000 mL | Freq: Once | INTRAVENOUS | Status: AC
  Administered 2018-01-22: 1000 mL via INTRAVENOUS

## 2018-01-22 NOTE — ED Provider Notes (Signed)
[facility] EMERGENCY DEPARTMENT Provider Note   CSN: [id number] Arrival date & time: 01/21/18  2252     History   Chief Complaint Chief Complaint  Patient presents with  . Abdominal Pain    HPI [name] is a 67 y.o. female.  67 year old female with a history of diabetes mellitus, DVT on chronic Xarelto, hypertension, dyslipidemia, hypothyroid presents to the emergency department for abdominal pain.  She was seen for similar abdominal pain on 01/08/2018.  She has not yet followed up with her gastroenterologist, Dr. Oletta Lamas, because she was unable to schedule an appointment for 2 weeks.  She returns today for persistent pain.  She states that she has been experiencing similar pain daily.  This involves after Phenergan and sometimes requires the use of oxycodone.  Patient reports taking these 2 medications today without improvement to her symptoms.  Pain is primarily in her upper abdomen and is associated with a bloating sensation.  The pain will radiate towards her back and is accompanied by nausea and dry heaves.  The patient had 2 normal bowel movements earlier this morning.  She has had 3 episodes of nonbloody diarrhea since ED arrival.  She denies any use of antibiotics recently.  No associated fevers.  Her pain has largely improved, though she feels like it is returning.  Abdominal surgical history significant for cholecystectomy, colectomy, appendectomy.     Past Medical History:  Diagnosis Date  . Anemia    iron deficiency  . Arthritis   . Blood transfusion   . Cervical spondylosis with myelopathy and radiculopathy   . Depression   . Diabetes mellitus    Type II  . Diastolic dysfunction   . Diverticulitis   . DJD (degenerative joint disease)   . DVT (deep venous thrombosis) (HCC)    times 2 lower leg  . Endometriosis   . Family history of adverse reaction to anesthesia    mom and dad PONV  . Fibromyalgia   . GERD (gastroesophageal reflux disease)     occ  . Hiatal hernia   . History of kidney stones   . Hyperlipidemia   . Hypertension   . Hypothyroidism   . Insomnia   . Mild aortic insufficiency    by echo 04/2013  . Osteoarthritis    back and knee  . Ovarian cyst   . PONV (postoperative nausea and vomiting)   . RLS (restless legs syndrome)   . Thoracic compression fracture ([place])   . Uterine fibroid   . UTI (lower urinary tract infection)   . Villous adenoma of right colon 04/08/2016  . Vitamin D deficiency disease     Patient Active Problem List   Diagnosis Date Noted  . Cerebral thrombosis with cerebral infarction 11/22/2017  . Acute encephalopathy   . DKA (diabetic ketoacidoses) ([place]) 11/18/2017  . Diabetic ketoacidosis ([place]) 11/18/2017  . Acute respiratory failure with hypoxia ([place])   . Acute renal failure ([place])   . Endotracheal tube present   . Septic shock ([place])   . Spondylolisthesis of lumbar region 11/15/2017  . PAC (premature atrial contraction) 09/04/2017  . Cervical spondylosis with radiculopathy 03/23/2017  . Hypoxia   . Villous adenoma of right colon 04/08/2016  . Leukocytosis 04/08/2016  . Anemia   . Diastolic dysfunction   . Mild aortic insufficiency   . Other nonspecific abnormal cardiovascular system function study 05/03/2013  . SOB (shortness of breath) 05/01/2013  . Atypical chest pain 02/28/2013  . Dysphagia 02/28/2013  .  UTI (lower urinary tract infection) 07/06/2012  . Intractable nausea and vomiting 07/06/2012  . Blood loss anemia 07/06/2012  . Acute kidney injury ([place]) 07/06/2012  . Epigastric abdominal pain 06/27/2012  . Type II diabetes mellitus ([place]) 07/28/2011  . Hypothyroidism 07/28/2011  . Hypertension 07/28/2011  . Hepatic steatosis 07/28/2011    Past Surgical History:  Procedure Laterality Date  . ANTERIOR CERVICAL DECOMP/DISCECTOMY FUSION N/A 03/23/2017   Procedure: ANTERIOR CERVICAL DECOMPRESSION/DISCECTOMY FUSION, INTERBODY PROSTHESIS,PLATE CERVICAL THREE- CERVICAL FOUR,  CERVICAL FOUR- CERVICAL FIVE, CERVICAL FIVE- CERVICAL SIX;  Surgeon: Newman Pies, MD;  Location: [facility];  Service: Neurosurgery;  Laterality: N/A;  . APPENDECTOMY    . BACK SURGERY  ,2005   April  2013 - spinal fusion[facility]  . BREAST SURGERY     breast reduction  . CARDIAC CATHETERIZATION  04/2013   normal coronary arteries and normal LVF  . CARPAL TUNNEL RELEASE  06/05/2012   Procedure: CARPAL TUNNEL RELEASE;  Surgeon: Cammie Sickle., MD;  Location: [facility];  Service: Orthopedics;  Laterality: Left;  . CESAREAN SECTION     x 2  . CHOLECYSTECTOMY    . COLECTOMY  04/08/2016  . DILATION AND CURETTAGE OF UTERUS    . DORSAL COMPARTMENT RELEASE  06/05/2012   Procedure: RELEASE DORSAL COMPARTMENT (DEQUERVAIN);  Surgeon: Cammie Sickle., MD;  Location[facility];  Service: Orthopedics;  Laterality: Left;  Excision of mixoid cyst also  . EYE SURGERY     cataracts bilateral  . JOINT REPLACEMENT     Bilateral knee  . KNEE ARTHROPLASTY  09   lft partial  . KNEE ARTHROPLASTY     rt  . LAPAROSCOPIC PARTIAL COLECTOMY N/A 04/08/2016   Procedure: LAPAROSCOPIC ASSISTED ASCENDING COLECTOMY POSSIBLE OPEN COLECTOMY;  Surgeon: Fanny Skates, MD;  Location: [facility];  Service: General;  Laterality: N/A;  . orthopedic surgeries     multiple  . REDUCTION MAMMAPLASTY Bilateral   . SHOULDER OPEN ROTATOR CUFF REPAIR     rt and lft  . TEE WITHOUT CARDIOVERSION N/A 11/23/2017   Procedure: TRANSESOPHAGEAL ECHOCARDIOGRAM (TEE);  Surgeon: Jerline Pain, MD;  Location: [facility] ENDOSCOPY;  Service: Cardiovascular;  Laterality: N/A;  . TUBAL LIGATION     btsp     OB History    Gravida  2   Para  2   Term  2   Preterm      AB      Living  2     SAB      TAB      Ectopic      Multiple      Live Births               Home Medications    Prior to Admission medications   Medication Sig Start Date End Date Taking? Authorizing Provider  acetaminophen  (TYLENOL) 500 MG tablet Take 1,000 mg by mouth every 8 (eight) hours as needed for mild pain or headache.   Yes [provider]  amLODipine (NORVASC) 5 MG tablet Take 5 mg by mouth daily.   Yes [provider]  aspirin 325 MG tablet Take 1 tablet (325 mg total) by mouth daily. 11/26/17  Yes Patrecia Pour, MD  clobetasol (TEMOVATE) 0.05 % external solution Apply 1 application topically See admin instructions. Apply twice daily to scalp and ear as needed for itching/rash (do not apply to face/groin/underarms) 07/13/16  Yes [provider]  Cyanocobalamin (B-12)  2500 MCG TABS Take 2,500 mcg by mouth daily.   Yes [provider]  cyclobenzaprine (FLEXERIL) 10 MG tablet Take 1 tablet (10 mg total) by mouth 3 (three) times daily as needed for muscle spasms. 03/24/17  Yes Costella, Vista Mink, PA-C  docusate sodium (COLACE) 100 MG capsule Take 1 capsule (100 mg total) by mouth 2 (two) times daily. 11/17/17  Yes Newman Pies, MD  DULoxetine (CYMBALTA) 60 MG capsule Take 120 mg by mouth daily.    Yes [provider]  ferrous sulfate 325 (65 FE) MG tablet Take 1 tablet (325 mg total) by mouth 2 (two) times daily with a meal. 11/17/17  Yes Newman Pies, MD  fluticasone [facility]) 50 MCG/ACT nasal spray Place 2 sprays into both nostrils daily as needed for allergies or rhinitis.   Yes [provider]  furosemide (LASIX) 20 MG tablet Take 20 mg by mouth daily as needed for fluid or edema.    Yes [provider]  JARDIANCE 10 MG TABS tablet Take 10 mg by mouth daily. 07/18/17  Yes [provider]  levothyroxine (SYNTHROID, LEVOTHROID) 150 MCG tablet Take 150 mcg by mouth daily before breakfast. 10/10/17  Yes [provider]  NOVOLIN 70/30 RELION (70-30) 100 UNIT/ML injection Inject 35 Units into the skin 2 (two) times daily. 11/25/17  Yes Patrecia Pour, MD  nystatin (MYCOSTATIN) 100000 UNIT/ML suspension Use as directed 5 mLs in the mouth  or throat 4 (four) times daily as needed (for tongue burning/mouth sores.).  08/22/17  Yes [provider]  olmesartan (BENICAR) 5 MG tablet Take 5 mg by mouth daily.   Yes [provider]  oxyCODONE (OXY IR/ROXICODONE) 5 MG immediate release tablet Take 1 tablet (5 mg total) by mouth every 4 (four) hours as needed for moderate pain ((score 4 to 6)). 11/17/17  Yes Newman Pies, MD  pantoprazole (PROTONIX) 40 MG tablet Take 40 mg by mouth 2 (two) times daily.   Yes [provider]  Probiotic Product (PROBIOTIC PO) Take 1 capsule by mouth daily.   Yes [provider]  promethazine (PHENERGAN) 25 MG tablet Take 25 mg by mouth every 8 (eight) hours as needed for nausea or vomiting.   Yes [provider]  rivaroxaban (XARELTO) 20 MG TABS tablet Take 20 mg by mouth daily.   Yes [provider]  temazepam (RESTORIL) 15 MG capsule Take 15 mg by mouth at bedtime.  10/10/17  Yes [provider]  Rivaroxaban 15 & 20 MG TBPK Take as directed on package: Start with one 15mg  tablet by mouth twice a day with food. On Day 22, switch to one 20mg  tablet once a day with food. Patient not taking: Reported on 01/08/2018 11/25/17   Patrecia Pour, MD    Family History Family History  Problem Relation Age of Onset  . Heart attack Father        78s  . Hypothyroidism Father   . Diabetes type II Father   . Diabetes type II Mother   . Hypertension Mother   . Breast cancer Mother 66  . Hypothyroidism Brother   . Breast cancer Maternal Aunt   . Breast cancer Cousin   . Breast cancer Maternal Aunt   . Breast cancer Cousin     Social History Social History   Tobacco Use  . Smoking status: Never Smoker  . Smokeless tobacco: Never Used  Substance Use Topics  . Alcohol use: No  . Drug use: No  Allergies   Codeine; Ambien [zolpidem tartrate]; Crestor [rosuvastatin]; Lipitor [atorvastatin]; Morphine and related; Penicillins; Statins; Dilaudid  [hydromorphone hcl]; Heparin; Amitriptyline; Ceftin [cefuroxime axetil]; Lovaza [omega-3-acid ethyl esters]; Lunesta [eszopiclone]; Lyrica [pregabalin]; and Metformin and related   Review of Systems Review of Systems Ten systems reviewed and are negative for acute change, except as noted in the HPI.    Physical Exam Updated Vital Signs BP (!) 155/79   Pulse 96   Temp 97.9 F (36.6 C) (Oral)   Resp 18   SpO2 95%   Physical Exam  Constitutional: She is oriented to person, place, and time. She appears well-developed and well-nourished. No distress.  Nontoxic appearing and in NAD  HENT:  Head: Normocephalic and atraumatic.  Eyes: Conjunctivae and EOM are normal. No scleral icterus.  Neck: Normal range of motion.  Cardiovascular: Normal rate, regular rhythm and intact distal pulses.  Pulmonary/Chest: Effort normal. No stridor. No respiratory distress. She has no wheezes.  Respirations even and unlabored  Abdominal: Soft. She exhibits no mass. There is tenderness (epigastrium). There is guarding (voluntary on palpation of epigastrium). There is no rebound.  Abdomen soft, obese. No peritoneal signs.  Musculoskeletal: Normal range of motion.  Neurological: She is alert and oriented to person, place, and time. She exhibits normal muscle tone. Coordination normal.  Skin: Skin is warm and dry. No rash noted. She is not diaphoretic. No erythema. No pallor.  Psychiatric: She has a normal mood and affect. Her behavior is normal.  Nursing note and vitals reviewed.    ED Treatments / Results  Labs (all labs ordered are listed, but only abnormal results are displayed) Labs Reviewed  COMPREHENSIVE METABOLIC PANEL - Abnormal; Notable for the following components:      Result Value   Glucose, Bld 61 (*)    ALT 10 (*)    All other components within normal limits  CBC - Abnormal; Notable for the following components:   WBC 10.7 (*)    RBC 5.94 (*)    HCT 47.6 (*)    MCH 24.2 (*)    RDW  17.2 (*)    All other components within normal limits  CBG MONITORING, ED - Abnormal; Notable for the following components:   Glucose-Capillary 164 (*)    All other components within normal limits  GASTROINTESTINAL PANEL BY PCR, STOOL (REPLACES STOOL CULTURE)  C DIFFICILE QUICK SCREEN W PCR REFLEX  LIPASE, BLOOD  TROPONIN I  URINALYSIS, ROUTINE W REFLEX MICROSCOPIC  TROPONIN I    EKG EKG Interpretation  Date/Time:  Sunday January 21 2018 23:05:52 EDT Ventricular Rate:  101 PR Interval:  160 QRS Duration: 80 QT Interval:  356 QTC Calculation: 461 R Axis:   -40 Text Interpretation:  Sinus tachycardia with Premature atrial complexes Left axis deviation Left ventricular hypertrophy with repolarization abnormality Abnormal ECG When compared with ECG of 01/08/2018, No significant change was found Confirmed by Delora Fuel ([id number]) on 01/21/2018 11:21:29 PM   Radiology Dg Abd 2 Views  Result Date: 01/22/2018 CLINICAL DATA:  Mid abdominal pain, nausea, vomiting, and diarrhea worse tonight but presents for months. EXAM: ABDOMEN - 2 VIEW COMPARISON:  CT abdomen and pelvis 01/08/2018 FINDINGS: Scattered gas present within nondilated small bowel and colon. No small or large bowel distention. No free intra-abdominal air. No abnormal air-fluid levels. No radiopaque stones. Surgical clips in the right upper quadrant. Postoperative changes in the lumbar spine. Degenerative changes in the lumbar spine and hips. IMPRESSION: Nonobstructive bowel gas pattern. Electronically Signed  By: Lucienne Capers M.D.   On: 01/22/2018 03:48    Procedures Procedures (including critical care time)  Medications Ordered in ED Medications  sodium chloride 0.9 % bolus 1,000 mL (0 mLs Intravenous Stopped 01/22/18 0350)  metoCLOPramide (REGLAN) injection 10 mg (10 mg Intravenous Given 01/22/18 0321)  dicyclomine (BENTYL) injection 20 mg (20 mg Intramuscular Given 01/22/18 0323)    3:00 AM Patient presenting for  epigastric abdominal pain. Hx of similar pain, usually daily. Was evaluated for this pain on 01/08/18 with reassuring labs, negative CT scan.  Patient states that her pain has slightly improved.  She is afebrile with stable vital signs.  Leukocytosis is consistent with prior evaluations.  Liver and kidney function preserved.  Initial troponin negative.  Awaiting UA.  Patient reports 3 episodes of diarrhea while in the ED.  Denies recent antibiotic use.  Will order stool culture if able to be obtained.  X-ray added to evaluate for obstruction given history of multiple abdominal surgeries.  5:15 AM  Patient reassessed by RN; states her pain has improved with IVF, Reglan, bentyl. Has been up and ambulatory in the ED. Urine specimen provided. Xray negative for obstruction.  6:03 AM Repeat troponin 0.01. UA in process.  6:41 AM Patient updated on delay with urine. Requesting something additional for pain. Fentanyl ordered in light of allergy profile.   Initial Impression / Assessment and Plan / ED Course  I have reviewed the triage vital signs and the nursing notes.  Pertinent labs & imaging results that were available during my care of the patient were reviewed by me and considered in my medical decision making (see chart for details).     67 year old female presents to the emergency department for complaints of abdominal pain.  She has been seen recently for similar abdominal pain and reports having a degree of pain on a daily basis.  This is usually improved with Phenergan and oxycodone.  The patient has had adequate pain management with Reglan and Bentyl.  She was also given IV fluids.  X-ray obtained given history of prior abdominal surgeries.  This shows no evidence of obstruction.  She has no leukocytosis or fever to suggest infectious etiology.  Vital signs have been stable.  No electrolyte derangements.  Liver and kidney function preserved.  Recent CT scan also reviewed which was negative  for acute or emergent process.  Urinalysis today does suggest UTI.  The patient will be started on Macrobid.  She has been referred to her GI doctor and primary care physician for follow-up.  Return precautions discussed and provided. Patient discharged in stable condition with no unaddressed concerns.   Final Clinical Impressions(s) / ED Diagnoses   Final diagnoses:  Abdominal pain    ED Discharge Orders        Ordered    nitrofurantoin, macrocrystal-monohydrate, (MACROBID) 100 MG capsule  2 times daily     01/22/18 0725       Antonietta Breach, PA-C 01/23/18 0507    Orpah Greek, MD 01/23/18 [phone]

## 2018-01-22 NOTE — Discharge Instructions (Addendum)
Your work-up in the emergency department today was reassuring.  Continue your daily medications as prescribed.  Follow-up with your gastroenterologist for further evaluation of symptoms.  You may also have a recheck by your primary care doctor if symptoms persist.

## 2018-01-23 DIAGNOSIS — E1169 Type 2 diabetes mellitus with other specified complication: Secondary | ICD-10-CM | POA: Diagnosis not present

## 2018-01-23 DIAGNOSIS — R1013 Epigastric pain: Secondary | ICD-10-CM | POA: Diagnosis not present

## 2018-01-24 ENCOUNTER — Other Ambulatory Visit (HOSPITAL_COMMUNITY): Payer: Self-pay | Admitting: Family Medicine

## 2018-01-24 DIAGNOSIS — R1013 Epigastric pain: Secondary | ICD-10-CM

## 2018-01-30 ENCOUNTER — Encounter (HOSPITAL_COMMUNITY)
Admission: RE | Admit: 2018-01-30 | Discharge: 2018-01-30 | Disposition: A | Discharge status: Home / Self Care | Payer: Medicare Other | Source: Ambulatory Visit | Attending: Family Medicine | Admitting: Family Medicine

## 2018-01-30 DIAGNOSIS — R1013 Epigastric pain: Secondary | ICD-10-CM | POA: Insufficient documentation

## 2018-01-30 DIAGNOSIS — R109 Unspecified abdominal pain: Secondary | ICD-10-CM | POA: Diagnosis not present

## 2018-01-30 MED ORDER — TECHNETIUM TC 99M SULFUR COLLOID
2.0000 | Freq: Once | INTRAVENOUS | Status: AC | PRN
  Administered 2018-01-30: 2 via ORAL

## 2018-02-01 DIAGNOSIS — K219 Gastro-esophageal reflux disease without esophagitis: Secondary | ICD-10-CM | POA: Diagnosis not present

## 2018-02-01 DIAGNOSIS — M797 Fibromyalgia: Secondary | ICD-10-CM | POA: Diagnosis not present

## 2018-02-01 DIAGNOSIS — E119 Type 2 diabetes mellitus without complications: Secondary | ICD-10-CM | POA: Diagnosis not present

## 2018-02-01 DIAGNOSIS — R1084 Generalized abdominal pain: Secondary | ICD-10-CM | POA: Diagnosis not present

## 2018-02-01 DIAGNOSIS — Z8639 Personal history of other endocrine, nutritional and metabolic disease: Secondary | ICD-10-CM | POA: Diagnosis not present

## 2018-02-01 DIAGNOSIS — Z8619 Personal history of other infectious and parasitic diseases: Secondary | ICD-10-CM | POA: Diagnosis not present

## 2018-02-05 DIAGNOSIS — M4688 Other specified inflammatory spondylopathies, sacral and sacrococcygeal region: Secondary | ICD-10-CM | POA: Diagnosis not present

## 2018-02-05 DIAGNOSIS — Z9889 Other specified postprocedural states: Secondary | ICD-10-CM | POA: Diagnosis not present

## 2018-02-05 DIAGNOSIS — M4686 Other specified inflammatory spondylopathies, lumbar region: Secondary | ICD-10-CM | POA: Diagnosis not present

## 2018-02-05 DIAGNOSIS — M5137 Other intervertebral disc degeneration, lumbosacral region: Secondary | ICD-10-CM | POA: Diagnosis not present

## 2018-02-05 DIAGNOSIS — M25552 Pain in left hip: Secondary | ICD-10-CM | POA: Diagnosis not present

## 2018-02-05 DIAGNOSIS — M5136 Other intervertebral disc degeneration, lumbar region: Secondary | ICD-10-CM | POA: Diagnosis not present

## 2018-02-05 DIAGNOSIS — M160 Bilateral primary osteoarthritis of hip: Secondary | ICD-10-CM | POA: Diagnosis not present

## 2018-02-05 DIAGNOSIS — Z967 Presence of other bone and tendon implants: Secondary | ICD-10-CM | POA: Diagnosis not present

## 2018-02-05 DIAGNOSIS — Z981 Arthrodesis status: Secondary | ICD-10-CM | POA: Diagnosis not present

## 2018-02-05 DIAGNOSIS — M5416 Radiculopathy, lumbar region: Secondary | ICD-10-CM | POA: Diagnosis not present

## 2018-02-05 DIAGNOSIS — S22080A Wedge compression fracture of T11-T12 vertebra, initial encounter for closed fracture: Secondary | ICD-10-CM | POA: Diagnosis not present

## 2018-02-15 DIAGNOSIS — E785 Hyperlipidemia, unspecified: Secondary | ICD-10-CM | POA: Diagnosis not present

## 2018-02-15 DIAGNOSIS — F325 Major depressive disorder, single episode, in full remission: Secondary | ICD-10-CM | POA: Diagnosis not present

## 2018-02-15 DIAGNOSIS — R1013 Epigastric pain: Secondary | ICD-10-CM | POA: Diagnosis not present

## 2018-02-15 DIAGNOSIS — R42 Dizziness and giddiness: Secondary | ICD-10-CM | POA: Diagnosis not present

## 2018-02-15 DIAGNOSIS — I10 Essential (primary) hypertension: Secondary | ICD-10-CM | POA: Diagnosis not present

## 2018-02-15 DIAGNOSIS — L219 Seborrheic dermatitis, unspecified: Secondary | ICD-10-CM | POA: Diagnosis not present

## 2018-02-22 DIAGNOSIS — E039 Hypothyroidism, unspecified: Secondary | ICD-10-CM | POA: Diagnosis not present

## 2018-02-22 DIAGNOSIS — E1065 Type 1 diabetes mellitus with hyperglycemia: Secondary | ICD-10-CM | POA: Diagnosis not present

## 2018-02-22 DIAGNOSIS — Z794 Long term (current) use of insulin: Secondary | ICD-10-CM | POA: Diagnosis not present

## 2018-02-22 DIAGNOSIS — Z8639 Personal history of other endocrine, nutritional and metabolic disease: Secondary | ICD-10-CM | POA: Diagnosis not present

## 2018-02-28 DIAGNOSIS — E119 Type 2 diabetes mellitus without complications: Secondary | ICD-10-CM | POA: Diagnosis not present

## 2018-02-28 DIAGNOSIS — Z8619 Personal history of other infectious and parasitic diseases: Secondary | ICD-10-CM | POA: Diagnosis not present

## 2018-02-28 DIAGNOSIS — K660 Peritoneal adhesions (postprocedural) (postinfection): Secondary | ICD-10-CM | POA: Diagnosis not present

## 2018-02-28 DIAGNOSIS — R1084 Generalized abdominal pain: Secondary | ICD-10-CM | POA: Diagnosis not present

## 2018-02-28 DIAGNOSIS — K219 Gastro-esophageal reflux disease without esophagitis: Secondary | ICD-10-CM | POA: Diagnosis not present

## 2018-03-01 DIAGNOSIS — E1065 Type 1 diabetes mellitus with hyperglycemia: Secondary | ICD-10-CM | POA: Diagnosis not present

## 2018-03-30 DIAGNOSIS — M48062 Spinal stenosis, lumbar region with neurogenic claudication: Secondary | ICD-10-CM | POA: Diagnosis not present

## 2018-03-30 DIAGNOSIS — Z6832 Body mass index (BMI) 32.0-32.9, adult: Secondary | ICD-10-CM | POA: Diagnosis not present

## 2018-03-30 DIAGNOSIS — I10 Essential (primary) hypertension: Secondary | ICD-10-CM | POA: Diagnosis not present

## 2018-04-05 DIAGNOSIS — L218 Other seborrheic dermatitis: Secondary | ICD-10-CM | POA: Diagnosis not present

## 2018-04-05 DIAGNOSIS — L658 Other specified nonscarring hair loss: Secondary | ICD-10-CM | POA: Diagnosis not present

## 2018-05-15 ENCOUNTER — Inpatient Hospital Stay (HOSPITAL_COMMUNITY)
Admission: EM | Admit: 2018-05-15 | Discharge: 2018-05-17 | DRG: 639 | Disposition: A | Discharge status: Home / Self Care | Payer: Medicare Other | Attending: Internal Medicine | Admitting: Internal Medicine

## 2018-05-15 ENCOUNTER — Encounter (HOSPITAL_COMMUNITY): Payer: Self-pay | Admitting: Emergency Medicine

## 2018-05-15 ENCOUNTER — Emergency Department (HOSPITAL_COMMUNITY): Payer: Medicare Other

## 2018-05-15 ENCOUNTER — Other Ambulatory Visit: Payer: Self-pay

## 2018-05-15 DIAGNOSIS — Z7901 Long term (current) use of anticoagulants: Secondary | ICD-10-CM

## 2018-05-15 DIAGNOSIS — R0689 Other abnormalities of breathing: Secondary | ICD-10-CM | POA: Diagnosis not present

## 2018-05-15 DIAGNOSIS — E1100 Type 2 diabetes mellitus with hyperosmolarity without nonketotic hyperglycemic-hyperosmolar coma (NKHHC): Secondary | ICD-10-CM | POA: Diagnosis present

## 2018-05-15 DIAGNOSIS — Z880 Allergy status to penicillin: Secondary | ICD-10-CM | POA: Diagnosis not present

## 2018-05-15 DIAGNOSIS — R110 Nausea: Secondary | ICD-10-CM

## 2018-05-15 DIAGNOSIS — E876 Hypokalemia: Secondary | ICD-10-CM | POA: Diagnosis not present

## 2018-05-15 DIAGNOSIS — R51 Headache: Secondary | ICD-10-CM | POA: Diagnosis not present

## 2018-05-15 DIAGNOSIS — R5383 Other fatigue: Secondary | ICD-10-CM | POA: Diagnosis not present

## 2018-05-15 DIAGNOSIS — G4700 Insomnia, unspecified: Secondary | ICD-10-CM | POA: Diagnosis present

## 2018-05-15 DIAGNOSIS — E039 Hypothyroidism, unspecified: Secondary | ICD-10-CM | POA: Diagnosis present

## 2018-05-15 DIAGNOSIS — I5189 Other ill-defined heart diseases: Secondary | ICD-10-CM

## 2018-05-15 DIAGNOSIS — R064 Hyperventilation: Secondary | ICD-10-CM | POA: Diagnosis not present

## 2018-05-15 DIAGNOSIS — Z7982 Long term (current) use of aspirin: Secondary | ICD-10-CM | POA: Diagnosis not present

## 2018-05-15 DIAGNOSIS — Z79899 Other long term (current) drug therapy: Secondary | ICD-10-CM | POA: Diagnosis not present

## 2018-05-15 DIAGNOSIS — M797 Fibromyalgia: Secondary | ICD-10-CM | POA: Diagnosis present

## 2018-05-15 DIAGNOSIS — F418 Other specified anxiety disorders: Secondary | ICD-10-CM | POA: Diagnosis present

## 2018-05-15 DIAGNOSIS — E785 Hyperlipidemia, unspecified: Secondary | ICD-10-CM | POA: Diagnosis present

## 2018-05-15 DIAGNOSIS — Z794 Long term (current) use of insulin: Secondary | ICD-10-CM

## 2018-05-15 DIAGNOSIS — Z981 Arthrodesis status: Secondary | ICD-10-CM | POA: Diagnosis not present

## 2018-05-15 DIAGNOSIS — R52 Pain, unspecified: Secondary | ICD-10-CM | POA: Diagnosis not present

## 2018-05-15 DIAGNOSIS — R000 Tachycardia, unspecified: Secondary | ICD-10-CM | POA: Diagnosis not present

## 2018-05-15 DIAGNOSIS — Z96653 Presence of artificial knee joint, bilateral: Secondary | ICD-10-CM | POA: Diagnosis present

## 2018-05-15 DIAGNOSIS — K219 Gastro-esophageal reflux disease without esophagitis: Secondary | ICD-10-CM | POA: Diagnosis present

## 2018-05-15 DIAGNOSIS — Z86718 Personal history of other venous thrombosis and embolism: Secondary | ICD-10-CM

## 2018-05-15 DIAGNOSIS — Z885 Allergy status to narcotic agent status: Secondary | ICD-10-CM

## 2018-05-15 DIAGNOSIS — I10 Essential (primary) hypertension: Secondary | ICD-10-CM | POA: Diagnosis not present

## 2018-05-15 DIAGNOSIS — F4321 Adjustment disorder with depressed mood: Secondary | ICD-10-CM

## 2018-05-15 DIAGNOSIS — F4320 Adjustment disorder, unspecified: Secondary | ICD-10-CM | POA: Diagnosis not present

## 2018-05-15 DIAGNOSIS — E1165 Type 2 diabetes mellitus with hyperglycemia: Secondary | ICD-10-CM | POA: Diagnosis not present

## 2018-05-15 DIAGNOSIS — Z888 Allergy status to other drugs, medicaments and biological substances status: Secondary | ICD-10-CM | POA: Diagnosis not present

## 2018-05-15 LAB — CBC WITH DIFFERENTIAL/PLATELET
ABS IMMATURE GRANULOCYTES: 0.01 10*3/uL (ref 0.00–0.07)
Basophils Absolute: 0 10*3/uL (ref 0.0–0.1)
Basophils Relative: 1 %
EOS ABS: 0.1 10*3/uL (ref 0.0–0.5)
Eosinophils Relative: 1 %
HEMATOCRIT: 40 % (ref 36.0–46.0)
Hemoglobin: 12.9 g/dL (ref 12.0–15.0)
IMMATURE GRANULOCYTES: 0 %
LYMPHS ABS: 0.8 10*3/uL (ref 0.7–4.0)
Lymphocytes Relative: 13 %
MCH: 26.5 pg (ref 26.0–34.0)
MCHC: 32.3 g/dL (ref 30.0–36.0)
MCV: 82.3 fL (ref 80.0–100.0)
MONOS PCT: 5 %
Monocytes Absolute: 0.3 10*3/uL (ref 0.1–1.0)
NEUTROS PCT: 80 %
Neutro Abs: 5.1 10*3/uL (ref 1.7–7.7)
Platelets: 226 10*3/uL (ref 150–400)
RBC: 4.86 MIL/uL (ref 3.87–5.11)
RDW: 15.2 % (ref 11.5–15.5)
WBC: 6.3 10*3/uL (ref 4.0–10.5)
nRBC: 0 % (ref 0.0–0.2)

## 2018-05-15 LAB — BASIC METABOLIC PANEL
ANION GAP: 8 (ref 5–15)
BUN: 14 mg/dL (ref 8–23)
CALCIUM: 9.1 mg/dL (ref 8.9–10.3)
CO2: 24 mmol/L (ref 22–32)
Chloride: 103 mmol/L (ref 98–111)
Creatinine, Ser: 0.97 mg/dL (ref 0.44–1.00)
GFR calc non Af Amer: 59 mL/min — ABNORMAL LOW (ref >60)
GLUCOSE: 577 mg/dL — AB (ref 70–99)
Potassium: 4.2 mmol/L (ref 3.5–5.1)
SODIUM: 135 mmol/L (ref 135–145)

## 2018-05-15 LAB — URINALYSIS, ROUTINE W REFLEX MICROSCOPIC
Bacteria, UA: NONE SEEN
Bilirubin Urine: NEGATIVE
HGB URINE DIPSTICK: NEGATIVE
Ketones, ur: 5 mg/dL — AB
Leukocytes, UA: NEGATIVE
Nitrite: NEGATIVE
PH: 7 (ref 5.0–8.0)
Protein, ur: NEGATIVE mg/dL
SPECIFIC GRAVITY, URINE: 1.018 (ref 1.005–1.030)

## 2018-05-15 LAB — COMPREHENSIVE METABOLIC PANEL
ALBUMIN: 4 g/dL (ref 3.5–5.0)
ALK PHOS: 121 U/L (ref 38–126)
ALT: 12 U/L (ref 0–44)
AST: 15 U/L (ref 15–41)
Anion gap: 9 (ref 5–15)
BUN: 14 mg/dL (ref 8–23)
CO2: 20 mmol/L — ABNORMAL LOW (ref 22–32)
CREATININE: 1.01 mg/dL — AB (ref 0.44–1.00)
Calcium: 9 mg/dL (ref 8.9–10.3)
Chloride: 103 mmol/L (ref 98–111)
GFR calc Af Amer: >60 mL/min (ref >60)
GFR calc non Af Amer: 56 mL/min — ABNORMAL LOW (ref >60)
GLUCOSE: 660 mg/dL — AB (ref 70–99)
POTASSIUM: 4.7 mmol/L (ref 3.5–5.1)
Sodium: 132 mmol/L — ABNORMAL LOW (ref 135–145)
Total Bilirubin: 0.6 mg/dL (ref 0.3–1.2)
Total Protein: 6.9 g/dL (ref 6.5–8.1)

## 2018-05-15 LAB — INFLUENZA PANEL BY PCR (TYPE A & B)
Influenza A By PCR: NEGATIVE
Influenza B By PCR: NEGATIVE

## 2018-05-15 LAB — CBG MONITORING, ED
Glucose-Capillary: 593 mg/dL (ref 70–99)
Glucose-Capillary: 490 mg/dL — ABNORMAL HIGH (ref 70–99)
Glucose-Capillary: 459 mg/dL — ABNORMAL HIGH (ref 70–99)

## 2018-05-15 LAB — TROPONIN I: Troponin I: <0.03 ng/mL (ref <0.03)

## 2018-05-15 LAB — MAGNESIUM: Magnesium: 1.8 mg/dL (ref 1.7–2.4)

## 2018-05-15 LAB — PHOSPHORUS: Phosphorus: 3.4 mg/dL (ref 2.5–4.6)

## 2018-05-15 MED ORDER — DOCUSATE SODIUM 100 MG PO CAPS
100.0000 mg | ORAL_CAPSULE | Freq: Two times a day (BID) | ORAL | Status: DC
  Administered 2018-05-16 – 2018-05-17 (×4): 100 mg via ORAL
  Filled 2018-05-15 (×4): qty 1

## 2018-05-15 MED ORDER — FERROUS SULFATE 325 (65 FE) MG PO TABS
325.0000 mg | ORAL_TABLET | Freq: Two times a day (BID) | ORAL | Status: DC
  Administered 2018-05-16 – 2018-05-17 (×3): 325 mg via ORAL
  Filled 2018-05-15 (×3): qty 1

## 2018-05-15 MED ORDER — INSULIN REGULAR BOLUS VIA INFUSION: 0.0000 [IU] | Freq: Three times a day (TID) | INTRAVENOUS | Status: DC

## 2018-05-15 MED ORDER — GABAPENTIN 300 MG PO CAPS
300.0000 mg | ORAL_CAPSULE | Freq: Every day | ORAL | Status: DC
  Administered 2018-05-16 (×2): 300 mg via ORAL
  Filled 2018-05-15 (×2): qty 1

## 2018-05-15 MED ORDER — VITAMIN B-12 1000 MCG PO TABS
2500.0000 ug | ORAL_TABLET | Freq: Every day | ORAL | Status: DC
  Administered 2018-05-16 – 2018-05-17 (×2): 2500 ug via ORAL
  Filled 2018-05-15 (×2): qty 3

## 2018-05-15 MED ORDER — SODIUM CHLORIDE 0.9 % IV SOLN
INTRAVENOUS | Status: DC
  Administered 2018-05-15: 23:00:00 via INTRAVENOUS

## 2018-05-15 MED ORDER — ONDANSETRON HCL 4 MG PO TABS: 4.0000 mg | ORAL_TABLET | Freq: Four times a day (QID) | ORAL | Status: DC | PRN

## 2018-05-15 MED ORDER — LORAZEPAM 0.5 MG PO TABS
ORAL_TABLET | ORAL | Status: AC
  Administered 2018-05-15: 0.5 mg via ORAL
  Filled 2018-05-15: qty 1

## 2018-05-15 MED ORDER — IRBESARTAN 75 MG PO TABS
37.5000 mg | ORAL_TABLET | Freq: Every day | ORAL | Status: DC
  Administered 2018-05-16 – 2018-05-17 (×2): 37.5 mg via ORAL
  Filled 2018-05-15 (×2): qty 1

## 2018-05-15 MED ORDER — INSULIN REGULAR BOLUS VIA INFUSION
0.0000 [IU] | Freq: Three times a day (TID) | INTRAVENOUS | Status: DC
  Filled 2018-05-15: qty 10

## 2018-05-15 MED ORDER — ACETAMINOPHEN 325 MG PO TABS
650.0000 mg | ORAL_TABLET | Freq: Four times a day (QID) | ORAL | Status: DC | PRN
  Administered 2018-05-16: 650 mg via ORAL
  Filled 2018-05-15: qty 2

## 2018-05-15 MED ORDER — METOPROLOL TARTRATE 5 MG/5ML IV SOLN
5.0000 mg | Freq: Once | INTRAVENOUS | Status: AC
  Administered 2018-05-16: 5 mg via INTRAVENOUS
  Filled 2018-05-15: qty 5

## 2018-05-15 MED ORDER — DEXTROSE-NACL 5-0.45 % IV SOLN: INTRAVENOUS | Status: DC

## 2018-05-15 MED ORDER — SODIUM CHLORIDE 0.45 % IV SOLN
INTRAVENOUS | Status: DC
  Administered 2018-05-15: via INTRAVENOUS

## 2018-05-15 MED ORDER — ASPIRIN EC 81 MG PO TBEC
81.0000 mg | DELAYED_RELEASE_TABLET | Freq: Every day | ORAL | Status: DC
  Administered 2018-05-16 – 2018-05-17 (×2): 81 mg via ORAL
  Filled 2018-05-15 (×2): qty 1

## 2018-05-15 MED ORDER — CYCLOBENZAPRINE HCL 10 MG PO TABS
10.0000 mg | ORAL_TABLET | Freq: Three times a day (TID) | ORAL | Status: DC | PRN
  Administered 2018-05-16: 10 mg via ORAL
  Filled 2018-05-15: qty 1

## 2018-05-15 MED ORDER — ACETAMINOPHEN 650 MG RE SUPP: 650.0000 mg | Freq: Four times a day (QID) | RECTAL | Status: DC | PRN

## 2018-05-15 MED ORDER — DEXTROSE 50 % IV SOLN: 25.0000 mL | INTRAVENOUS | Status: DC | PRN

## 2018-05-15 MED ORDER — LORAZEPAM 2 MG/ML IJ SOLN
0.5000 mg | INTRAMUSCULAR | Status: AC | PRN
  Administered 2018-05-16 (×2): 0.5 mg via INTRAVENOUS
  Filled 2018-05-15 (×2): qty 1

## 2018-05-15 MED ORDER — ONDANSETRON HCL 4 MG/2ML IJ SOLN: 4.0000 mg | Freq: Four times a day (QID) | INTRAMUSCULAR | Status: DC | PRN

## 2018-05-15 MED ORDER — LORAZEPAM 0.5 MG PO TABS
0.5000 mg | ORAL_TABLET | Freq: Once | ORAL | Status: AC
  Administered 2018-05-15: 0.5 mg via ORAL
  Filled 2018-05-15: qty 1

## 2018-05-15 MED ORDER — SODIUM CHLORIDE 0.9 % IV SOLN: INTRAVENOUS | Status: DC

## 2018-05-15 MED ORDER — FUROSEMIDE 20 MG PO TABS: 20.0000 mg | ORAL_TABLET | Freq: Every day | ORAL | Status: DC | PRN

## 2018-05-15 MED ORDER — DEXTROSE-NACL 5-0.45 % IV SOLN
INTRAVENOUS | Status: DC
  Administered 2018-05-16: 03:00:00 via INTRAVENOUS

## 2018-05-15 MED ORDER — LEVOTHYROXINE SODIUM 75 MCG PO TABS
150.0000 ug | ORAL_TABLET | Freq: Every day | ORAL | Status: DC
  Administered 2018-05-16 – 2018-05-17 (×2): 150 ug via ORAL
  Filled 2018-05-15 (×2): qty 2

## 2018-05-15 MED ORDER — AMLODIPINE BESYLATE 5 MG PO TABS
2.5000 mg | ORAL_TABLET | Freq: Every day | ORAL | Status: DC
  Administered 2018-05-16 – 2018-05-17 (×2): 2.5 mg via ORAL
  Filled 2018-05-15 (×2): qty 1

## 2018-05-15 MED ORDER — SODIUM CHLORIDE 0.9 % IV BOLUS
1000.0000 mL | Freq: Once | INTRAVENOUS | Status: AC
  Administered 2018-05-15: 1000 mL via INTRAVENOUS

## 2018-05-15 MED ORDER — RIVAROXABAN 20 MG PO TABS
20.0000 mg | ORAL_TABLET | Freq: Every day | ORAL | Status: DC
  Administered 2018-05-16 – 2018-05-17 (×2): 20 mg via ORAL
  Filled 2018-05-15 (×2): qty 1

## 2018-05-15 MED ORDER — INFLUENZA VAC SPLIT HIGH-DOSE 0.5 ML IM SUSY
0.5000 mL | PREFILLED_SYRINGE | INTRAMUSCULAR | Status: DC
  Filled 2018-05-15: qty 0.5

## 2018-05-15 MED ORDER — TEMAZEPAM 15 MG PO CAPS
15.0000 mg | ORAL_CAPSULE | Freq: Every day | ORAL | Status: DC
  Administered 2018-05-16 (×2): 15 mg via ORAL
  Filled 2018-05-15 (×2): qty 1

## 2018-05-15 MED ORDER — PROMETHAZINE HCL 25 MG/ML IJ SOLN
12.5000 mg | Freq: Once | INTRAMUSCULAR | Status: AC
  Administered 2018-05-15: 12.5 mg via INTRAVENOUS
  Filled 2018-05-15: qty 1

## 2018-05-15 MED ORDER — PANTOPRAZOLE SODIUM 40 MG PO TBEC
40.0000 mg | DELAYED_RELEASE_TABLET | Freq: Every day | ORAL | Status: DC
  Administered 2018-05-16 – 2018-05-17 (×2): 40 mg via ORAL
  Filled 2018-05-15 (×2): qty 1

## 2018-05-15 MED ORDER — OXYCODONE HCL 5 MG PO TABS
5.0000 mg | ORAL_TABLET | ORAL | Status: DC | PRN
  Administered 2018-05-16: 5 mg via ORAL
  Filled 2018-05-15: qty 1

## 2018-05-15 MED ORDER — SODIUM CHLORIDE 0.9 % IV SOLN
INTRAVENOUS | Status: DC
  Administered 2018-05-15: 4.3 [IU]/h via INTRAVENOUS
  Filled 2018-05-15 (×2): qty 1

## 2018-05-15 MED ORDER — DULOXETINE HCL 60 MG PO CPEP
120.0000 mg | ORAL_CAPSULE | Freq: Every day | ORAL | Status: DC
  Administered 2018-05-16 – 2018-05-17 (×2): 120 mg via ORAL
  Filled 2018-05-15 (×2): qty 2

## 2018-05-15 NOTE — ED Notes (Signed)
Date and time results received: 05/15/18 21:49   Test: Glucose Critical Value: 660  Name of Provider Notified: MCMannus  Orders Received? Or Actions Taken?: Physician notified

## 2018-05-15 NOTE — H&P (Signed)
History and Physical    [name] ZWC:[id number] DOB: 02-May-1951 DOA: 05/15/2018  PCP: Harlan Stains, MD  Patient coming from: Home.  I have personally briefly reviewed patient's old medical records in [place]  Chief Complaint: Hyperglycemia and headache.  HPI: [name] is a 67 y.o. female with medical history significant of history of iron deficiency anemia, osteoarthritis, cervical spondylosis, chronic back pain, history of cervical spine and back surgery x3, depression with anxiety, diastolic dysfunction, diverticulosis with history of diverticulitis, history of lower extremities DVT x2, endometriosis, fibromyalgia, GERD,/hiatal hernia, history of urolithiasis, hyperlipidemia, hypertension, hypothyroidism, insomnia, mild aortic insufficiency, ovarian cyst, restless leg syndrome, history of thoracic compression fracture, history of uterine fibroids, vitamin D deficiency, villous adenoma of the right colon, history of a UTI who is coming to the emergency department due to having elevated glucose, a splitting headache and anxiety after she witnessed her brother passing away just before receiving CPR for a while by EMS.  The patient became very anxious, tremulous and started hyperventilating.  She forgot to administer her evening dose of insulin.  Then when his CBG was checked by EMS on seeing it was reading high.  EMS subsequently proceeded to transport her to the emergency department.  They gave a liter of normal saline bolus in route.  She denies fever, chills, sore throat, rhinorrhea, dyspnea, productive cough, wheezing, chest pain, diaphoresis, but states she fell dyspneic, with palpitations and mild dizziness earlier.  She denies PND, orthopnea or recent pitting edema lower extremities.  No abdominal pain, nausea, emesis, diarrhea, melena or hematochezia.  She occasionally gets constipation, which is controlled with stool softeners at this time.  She denies dysuria, frequency or  hematuria.  She has been urinating multiple times this evening, but denies polydipsia or polyphagia.  No heat or cold intolerance.  ED Course: Initial vital signs temperature 98.2 F, pulse 105, respirations 16, blood pressure 215/110 mmHg and O2 sat 100% on room air.  She received a 1000 mL NS bolus, 12.5 mg of Phenergan IVP x1, 0.5 mg of lorazepam IVP x1 and was started on the continuous insulin infusion.  Her urinalysis showed glucosuria of 500 and ketonuria of only 5 mg/dL.  CBC was normal.  Troponin was normal.  Influenza a and B by PCR was negative.  Sodium 132, potassium 4.7, chloride 103 and CO2 20 mmol/L.  Glucose of 660, BUN 14 creatinine 1.01 mg/dL.  Her LFTs were within normal limits.  Imaging: CT head did not show any acute intracranial findings.  However there are evidence of known small strokes diagnosed earlier this year.  There is periventricular white matter and corona radiate hypodensities consistent with chronic ischemic microvascular white matter disease.  There is mild chronic right maxillary and ethmoid sinusitis. Her chest radiograph did not have any acute cardiopulmonary disease.  Please see images and full radiology report for further detail.  Review of Systems: As per HPI otherwise 10 point review of systems negative.   Past Medical History:  Diagnosis Date  . Anemia    iron deficiency  . Arthritis   . Blood transfusion   . Cervical spondylosis with myelopathy and radiculopathy   . Depression   . Diabetes mellitus    Type II  . Diastolic dysfunction   . Diverticulitis   . DJD (degenerative joint disease)   . DVT (deep venous thrombosis) (HCC)    times 2 lower leg  . Endometriosis   . Family history of adverse reaction to anesthesia  mom and dad PONV  . Fibromyalgia   . GERD (gastroesophageal reflux disease)    occ  . Hiatal hernia   . History of kidney stones   . Hyperlipidemia   . Hypertension   . Hypothyroidism   . Insomnia   . Mild aortic  insufficiency    by echo 04/2013  . Osteoarthritis    back and knee  . Ovarian cyst   . PONV (postoperative nausea and vomiting)   . RLS (restless legs syndrome)   . Thoracic compression fracture ([place])   . Uterine fibroid   . UTI (lower urinary tract infection)   . Villous adenoma of right colon 04/08/2016  . Vitamin D deficiency disease     Past Surgical History:  Procedure Laterality Date  . ANTERIOR CERVICAL DECOMP/DISCECTOMY FUSION N/A 03/23/2017   Procedure: ANTERIOR CERVICAL DECOMPRESSION/DISCECTOMY FUSION, INTERBODY PROSTHESIS,PLATE CERVICAL THREE- CERVICAL FOUR, CERVICAL FOUR- CERVICAL FIVE, CERVICAL FIVE- CERVICAL SIX;  Surgeon: Newman Pies, MD;  Location: [facility];  Service: Neurosurgery;  Laterality: N/A;  . APPENDECTOMY    . BACK SURGERY  ,2005   April  2013 - spinal fusion[facility]  . BREAST SURGERY     breast reduction  . CARDIAC CATHETERIZATION  04/2013   normal coronary arteries and normal LVF  . CARPAL TUNNEL RELEASE  06/05/2012   Procedure: CARPAL TUNNEL RELEASE;  Surgeon: Cammie Sickle., MD;  Location: [facility];  Service: Orthopedics;  Laterality: Left;  . CESAREAN SECTION     x 2  . CHOLECYSTECTOMY    . COLECTOMY  04/08/2016  . DILATION AND CURETTAGE OF UTERUS    . DORSAL COMPARTMENT RELEASE  06/05/2012   Procedure: RELEASE DORSAL COMPARTMENT (DEQUERVAIN);  Surgeon: Cammie Sickle., MD;  Location: [facility];  Service: Orthopedics;  Laterality: Left;  Excision of mixoid cyst also  . EYE SURGERY     cataracts bilateral  . JOINT REPLACEMENT     Bilateral knee  . KNEE ARTHROPLASTY  09   lft partial  . KNEE ARTHROPLASTY     rt  . LAPAROSCOPIC PARTIAL COLECTOMY N/A 04/08/2016   Procedure: LAPAROSCOPIC ASSISTED ASCENDING COLECTOMY POSSIBLE OPEN COLECTOMY;  Surgeon: Fanny Skates, MD;  Location: [facility];  Service: General;  Laterality: N/A;  . orthopedic surgeries     multiple  . REDUCTION MAMMAPLASTY Bilateral   . SHOULDER  OPEN ROTATOR CUFF REPAIR     rt and lft  . TEE WITHOUT CARDIOVERSION N/A 11/23/2017   Procedure: TRANSESOPHAGEAL ECHOCARDIOGRAM (TEE);  Surgeon: Jerline Pain, MD;  Location: [facility];  Service: Cardiovascular;  Laterality: N/A;  . TUBAL LIGATION     btsp     reports that she has never smoked. She has never used smokeless tobacco. She reports that she does not drink alcohol or use drugs.  Allergies  Allergen Reactions  . Codeine Nausea And Vomiting  . Ambien [Zolpidem Tartrate] Other (See Comments)    Up sleep walking and eating  . Crestor [Rosuvastatin] Other (See Comments)    Muscle weakness, cramps and aching all over body  . Lipitor [Atorvastatin] Itching and Other (See Comments)    Muscle weakness, cramps and aches all over body  . Morphine And Related Other (See Comments)    Flushing and feeling hot Tolerates with Diphenhydramine  . Penicillins Rash and Other (See Comments)    Caused Headaches also Has patient had a PCN reaction causing immediate rash, facial/tongue/throat swelling, SOB or lightheadedness with hypotension: No  Has patient had a PCN reaction causing severe rash involving mucus membranes or skin necrosis: No Has patient had a PCN reaction that required hospitalization No Has patient had a PCN reaction occurring within the last 10 years: No If all of the above answers are "NO", then may proceed with Cephalosporin use.    . Statins Other (See Comments)    Muscle weakness, cramps and aching all over body  . Dilaudid [Hydromorphone Hcl] Other (See Comments)    Hallucinations/ argumentative, goes beserk  . Heparin Other (See Comments)    HIT ab positive  . Amitriptyline Other (See Comments)    Loopy feeling  . Ceftin [Cefuroxime Axetil] Other (See Comments)    Stomach pain   . Lovaza [Omega-3-Acid Ethyl Esters] Other (See Comments)    Stomach pain   . Lunesta [Eszopiclone] Other (See Comments)    Causes dizziness  . Lyrica [Pregabalin] Nausea Only  .  Metformin And Related Nausea Only    Pt denies this intolerance    Family History  Problem Relation Age of Onset  . Heart attack Father        13s  . Hypothyroidism Father   . Diabetes type II Father   . Diabetes type II Mother   . Hypertension Mother   . Breast cancer Mother 7  . Hypothyroidism Brother   . Breast cancer Maternal Aunt   . Breast cancer Cousin   . Breast cancer Maternal Aunt   . Breast cancer Cousin    Prior to Admission medications   Medication Sig Start Date End Date Taking? Authorizing Provider  acetaminophen (TYLENOL) 500 MG tablet Take 1,000 mg by mouth every 8 (eight) hours as needed for mild pain or headache.   Yes [provider]  amLODipine (NORVASC) 5 MG tablet Take 2.5 mg by mouth daily.    Yes [provider]  aspirin EC 81 MG tablet Take 81 mg by mouth daily.   Yes [provider]  clobetasol (TEMOVATE) 0.05 % external solution Apply 1 application topically See admin instructions. Apply twice daily to scalp and ear as needed for itching/rash (do not apply to face/groin/underarms) 07/13/16  Yes [provider]  Cyanocobalamin (B-12) 2500 MCG TABS Take 2,500 mcg by mouth daily.   Yes [provider]  cyclobenzaprine (FLEXERIL) 10 MG tablet Take 1 tablet (10 mg total) by mouth 3 (three) times daily as needed for muscle spasms. 03/24/17  Yes Costella, Vista Mink, PA-C  DEXILANT 60 MG capsule Take 60 mg by mouth daily.  05/14/18  Yes [provider]  docusate sodium (COLACE) 100 MG capsule Take 1 capsule (100 mg total) by mouth 2 (two) times daily. 11/17/17  Yes Newman Pies, MD  DULoxetine (CYMBALTA) 60 MG capsule Take 120 mg by mouth daily.    Yes [provider]  ferrous sulfate 325 (65 FE) MG tablet Take 1 tablet (325 mg total) by mouth 2 (two) times daily with a meal. 11/17/17  Yes Newman Pies, MD  fluticasone [facility]) 50 MCG/ACT nasal spray Place 2 sprays into both nostrils daily as needed  for allergies or rhinitis.   Yes [provider]  furosemide (LASIX) 20 MG tablet Take 20 mg by mouth daily as needed for fluid or edema.    Yes [provider]  gabapentin (NEURONTIN) 300 MG capsule Take 300 mg by mouth at bedtime. *May take 600mg  at bedtime if needed 03/30/18  Yes [provider]  levothyroxine (SYNTHROID, LEVOTHROID) 150 MCG tablet Take  150 mcg by mouth daily before breakfast. 10/10/17  Yes [provider]  NOVOLIN 70/30 RELION (70-30) 100 UNIT/ML injection Inject 35 Units into the skin 2 (two) times daily. 11/25/17  Yes Patrecia Pour, MD  oxyCODONE (OXY IR/ROXICODONE) 5 MG immediate release tablet Take 1 tablet (5 mg total) by mouth every 4 (four) hours as needed for moderate pain ((score 4 to 6)). 11/17/17  Yes Newman Pies, MD  Pitavastatin Calcium (LIVALO) 2 MG TABS Take 2 mg by mouth every Wednesday.   Yes [provider]  Probiotic Product (PROBIOTIC PO) Take 1 capsule by mouth daily.   Yes [provider]  promethazine (PHENERGAN) 25 MG tablet Take 25 mg by mouth every 8 (eight) hours as needed for nausea or vomiting.   Yes [provider]  rivaroxaban (XARELTO) 20 MG TABS tablet Take 20 mg by mouth daily.   Yes [provider]  temazepam (RESTORIL) 15 MG capsule Take 15 mg by mouth at bedtime.  10/10/17  Yes [provider]  valsartan (DIOVAN) 40 MG tablet Take 40 mg by mouth every evening. 04/16/18  Yes [provider]    Physical Exam: Vitals:   05/15/18 2042 05/15/18 2100 05/15/18 2232 05/15/18 2300  BP: (!) 215/110 (!) 191/90 (!) 187/87 (!) 188/88  Pulse: (!) 105 94 (!) 101 (!) 103  Resp: 16 18 (!) 24 20  Temp: 98.2 F (36.8 C)     TempSrc: Oral     SpO2: 100% 94% 95% 97%  Weight: 83.9 kg     Height: 5\' 3"  (1.6 m)       Constitutional: NAD, calm, comfortable Eyes: PERRL, lids and conjunctivae normal ENMT: Mucous membranes and lips are mildly dry.  Posterior pharynx clear  of any exudate or lesions.delete Neck: normal, supple, no masses, no thyromegaly Respiratory: clear to auscultation bilaterally, no wheezing, no crackles. Normal respiratory effort. No accessory muscle use.  Cardiovascular: Regular rate and rhythm, no murmurs / rubs / gallops. No extremity edema. 2+ pedal pulses. No carotid bruits.  Abdomen: Obese, soft, no tenderness, no masses palpated. No hepatosplenomegaly. Bowel sounds positive.  Musculoskeletal: no clubbing / cyanosis.  Good ROM, no contractures. Normal muscle tone.  Skin: no clinically significant rashes, lesions, ulcers. No induration on very limited dermatological exam. Neurologic: CN 2-12 grossly intact. Sensation intact, DTR normal. Strength 5/5 in all 4.  Psychiatric: Normal judgment and insight. Alert and oriented x 3.  Mildly anxious mood.   Labs on Admission: I have personally reviewed following labs and imaging studies  CBC: Recent Labs  Lab 05/15/18 2053  WBC 6.3  NEUTROABS 5.1  HGB 12.9  HCT 40.0  MCV 82.3  PLT 756   Basic Metabolic Panel: Recent Labs  Lab 05/15/18 2053  NA 132*  K 4.7  CL 103  CO2 20*  GLUCOSE 660*  BUN 14  CREATININE 1.01*  CALCIUM 9.0   GFR: Estimated Creatinine Clearance: 55.5 mL/min (A) (by C-G formula based on SCr of 1.01 mg/dL (H)). Liver Function Tests: Recent Labs  Lab 05/15/18 2053  AST 15  ALT 12  ALKPHOS 121  BILITOT 0.6  PROT 6.9  ALBUMIN 4.0   No results for input(s): LIPASE, AMYLASE in the last 168 hours. No results for input(s): AMMONIA in the last 168 hours. Coagulation Profile: No results for input(s): INR, PROTIME in the last 168 hours. Cardiac Enzymes: Recent Labs  Lab 05/15/18 2053  TROPONINI <0.03   BNP (last 3 results) No results for input(s):  PROBNP in the last 8760 hours. HbA1C: No results for input(s): HGBA1C in the last 72 hours. CBG: Recent Labs  Lab 05/15/18 2042 05/15/18 2210 05/15/18 2308  GLUCAP 593* 490* 459*   Lipid Profile: No  results for input(s): CHOL, HDL, LDLCALC, TRIG, CHOLHDL, LDLDIRECT in the last 72 hours. Thyroid Function Tests: No results for input(s): TSH, T4TOTAL, FREET4, T3FREE, THYROIDAB in the last 72 hours. Anemia Panel: No results for input(s): VITAMINB12, FOLATE, FERRITIN, TIBC, IRON, RETICCTPCT in the last 72 hours. Urine analysis:    Component Value Date/Time   COLORURINE STRAW (A) 05/15/2018 2042   APPEARANCEUR CLEAR 05/15/2018 2042   LABSPEC 1.018 05/15/2018 2042   PHURINE 7.0 05/15/2018 2042   GLUCOSEU >=500 (A) 05/15/2018 2042   HGBUR NEGATIVE 05/15/2018 2042   BILIRUBINUR NEGATIVE 05/15/2018 2042   KETONESUR 5 (A) 05/15/2018 2042   PROTEINUR NEGATIVE 05/15/2018 2042   UROBILINOGEN 0.2 07/30/2012 2228   NITRITE NEGATIVE 05/15/2018 2042   LEUKOCYTESUR NEGATIVE 05/15/2018 2042    Radiological Exams on Admission: Dg Chest 2 View  Result Date: 05/15/2018 CLINICAL DATA:  Fatigue, anxiety. EXAM: CHEST - 2 VIEW COMPARISON:  Radiographs of January 08, 2018. FINDINGS: Stable cardiomediastinal silhouette. No pneumothorax or pleural effusion is noted. Both lungs are clear. The visualized skeletal structures are unremarkable. IMPRESSION: No active cardiopulmonary disease. Electronically Signed   By: Marijo Conception, M.D.   On: 05/15/2018 21:50   Ct Head Wo Contrast  Result Date: 05/15/2018 CLINICAL DATA:  Headache.  Hyperglycemia EXAM: CT HEAD WITHOUT CONTRAST TECHNIQUE: Contiguous axial images were obtained from the base of the skull through the vertex without intravenous contrast. COMPARISON:  Multiple exams, including 11/22/2017 FINDINGS: Brain: The brainstem, cerebellum, cerebral peduncles, thalami, basal ganglia, basilar cisterns, and ventricular system appear within normal limits. Periventricular white matter and corona radiata hypodensities favor chronic ischemic microvascular white matter disease. The previous faint subcortical hypodensity posteriorly along the right frontal lobe is slightly  less conspicuous today, seen on image 21/2. No intracranial hemorrhage, mass lesion, or acute CVA. Vascular: Unremarkable Skull: Unremarkable Sinuses/Orbits: Chronic right maxillary sinusitis and mild chronic right ethmoid sinusitis. Other: No supplemental non-categorized findings. IMPRESSION: 1. No acute intracranial findings. 2. Periventricular white matter and corona radiata hypodensities favor chronic ischemic microvascular white matter disease. 3. Findings related to previous known small strokes are less conspicuous on today's exam. 4. Mild chronic right maxillary and ethmoid sinusitis. Electronically Signed   By: Van Clines M.D.   On: 05/15/2018 21:58   11/23/2017 echo TEE  ------------------------------------------------------------------- LV EF: 55% -   60%  ------------------------------------------------------------------- Indications:      CVA 436.  ------------------------------------------------------------------- Study Conclusions  - Left ventricle: The cavity size was normal. Wall thickness was   normal. Systolic function was normal. The estimated ejection   fraction was in the range of 55% to 60%. - Aortic valve: No evidence of vegetation. There was mild   regurgitation. - Mitral valve: No evidence of vegetation. There was mild   regurgitation. - Left atrium: No evidence of thrombus in the appendage. - Right atrium: No evidence of thrombus in the atrial cavity or   appendage. - Atrial septum: No defect or patent foramen ovale was identified.   Echo contrast study showed no right-to-left atrial level shunt,   following an increase in RA pressure induced by provocative   maneuvers. - Tricuspid valve: No evidence of vegetation. - Pulmonic valve: No evidence of vegetation. - Superior vena cava: The study excluded a thrombus.  Impressions:  -  No cardiac source of emboli was indentified.  EKG: Independently reviewed. Unable to find paper tracing or to see  EKG on EMR. Below copied and pasted the report from Dr. Thurnell Garbe note.  ED ECG REPORT   Date: 05/15/2018  Rate: 92  Rhythm: normal sinus rhythm  QRS Axis: left  Intervals: normal  ST/T Wave abnormalities: normal  Conduction Disutrbances:none  Narrative Interpretation: Baseline wander  Old EKG Reviewed: unchanged; rate slower otherwise significant changes compared to previous EKG dated 11/19/2017 and 01/21/2018.  Assessment/Plan Principal Problem:   Diabetic hyperosmolar non-ketotic state ([place]) Admit to stepdown/inpatient. Keep n.p.o. Continue IV fluids. Continue insulin infusion. CBG every hour until infusion discontinue. BMP every 4 hours. Replace electrolytes as needed.  Active Problems:   Hypothyroidism Continue levothyroxine 150 mcg p.o. daily. Check TSH level.    Hypertension Continue amlodipine 10 mg p.o. daily. Continue valsartan 40 mg p.o. daily. Monitor blood pressure, renal function electrolytes.    Diastolic dysfunction No signs of decompensation at this time. On valsartan. On furosemide as needed.    Hyperlipidemia The patient takes a nonformulary statin prescription. I will defer use of our formulary choice given history of statin sensitivity.    GERD (gastroesophageal reflux disease) Protonix 40 mg p.o. daily.    Depression with anxiety This is worse due to witnessing brothers CPR. Continue Cymbalta 120 mg p.o. daily. Continue temazepam 50 mg p.o. at bedtime. Lorazepam 0.5 mg IVPB q. 4 hours x 2 doses for anxiety.   DVT prophylaxis: On Xarelto. Code Status: Full code. Family Communication: Her son was present in the ED room. Disposition Plan: Admit for IV hydration and continuous glucose infusion. Consults called:  Admission status: Inpatient/stepdown.   Reubin Milan MD [facility] Pager [phone].  If 7PM-7AM, please contact night-coverage [email or web address] Password [email or web address]  05/15/2018, 11:25 PM

## 2018-05-15 NOTE — ED Notes (Signed)
AC notified for insulin

## 2018-05-15 NOTE — ED Notes (Signed)
Patient droped her 1st ativan. Second ativan pulled as an overide.

## 2018-05-15 NOTE — ED Triage Notes (Signed)
Pt from home. Pt called EMS for hyperglycemia. Pt C/O "slpitting headache." Pt recently lost brother today. Pt denies N/V/D. Pt received 1021ml bolus in route via EMS.

## 2018-05-15 NOTE — ED Provider Notes (Signed)
[facility] EMERGENCY DEPARTMENT Provider Note   CSN: [id number] Arrival date & time: 05/15/18  2037     History   Chief Complaint Chief Complaint  Patient presents with  . Hyperglycemia    HPI [name] is a 67 y.o. female.  HPI  Pt was seen at 2045. Per pt and her family: Pt with gradual onset and persistence of constant "anxiety and grief" since being on scene with EMS when her brother died approximately 58 PTA. Pt's family states they called EMS because pt was "having a grief reaction." Pt's symptoms include: bitemporal headache, tearfulness, hyperventilation, anxiety, and "shaking all over." Pt states she did not take her insulin this evening and her CBG was "high" on scene. EMS states they gave IVF bolus en route. Pt states she has "felt achy" for the past few days, but denies any specific symptom. Denies fevers, no sore throat, no rash, no CP/SOB, no cough, no back pain, no abd pain, no N/V/D, no focal motor weakness.    Past Medical History:  Diagnosis Date  . Anemia    iron deficiency  . Arthritis   . Blood transfusion   . Cervical spondylosis with myelopathy and radiculopathy   . Depression   . Diabetes mellitus    Type II  . Diastolic dysfunction   . Diverticulitis   . DJD (degenerative joint disease)   . DVT (deep venous thrombosis) (HCC)    times 2 lower leg  . Endometriosis   . Family history of adverse reaction to anesthesia    mom and dad PONV  . Fibromyalgia   . GERD (gastroesophageal reflux disease)    occ  . Hiatal hernia   . History of kidney stones   . Hyperlipidemia   . Hypertension   . Hypothyroidism   . Insomnia   . Mild aortic insufficiency    by echo 04/2013  . Osteoarthritis    back and knee  . Ovarian cyst   . PONV (postoperative nausea and vomiting)   . RLS (restless legs syndrome)   . Thoracic compression fracture ([place])   . Uterine fibroid   . UTI (lower urinary tract infection)   . Villous adenoma of right colon 04/08/2016    . Vitamin D deficiency disease     Patient Active Problem List   Diagnosis Date Noted  . Cerebral thrombosis with cerebral infarction 11/22/2017  . Acute encephalopathy   . DKA (diabetic ketoacidoses) ([place]) 11/18/2017  . Diabetic ketoacidosis ([place]) 11/18/2017  . Acute respiratory failure with hypoxia ([place])   . Acute renal failure ([place])   . Endotracheal tube present   . Septic shock ([place])   . Spondylolisthesis of lumbar region 11/15/2017  . PAC (premature atrial contraction) 09/04/2017  . Cervical spondylosis with radiculopathy 03/23/2017  . Hypoxia   . Villous adenoma of right colon 04/08/2016  . Leukocytosis 04/08/2016  . Anemia   . Diastolic dysfunction   . Mild aortic insufficiency   . Other nonspecific abnormal cardiovascular system function study 05/03/2013  . SOB (shortness of breath) 05/01/2013  . Atypical chest pain 02/28/2013  . Dysphagia 02/28/2013  . UTI (lower urinary tract infection) 07/06/2012  . Intractable nausea and vomiting 07/06/2012  . Blood loss anemia 07/06/2012  . Acute kidney injury ([place]) 07/06/2012  . Epigastric abdominal pain 06/27/2012  . Type II diabetes mellitus ([place]) 07/28/2011  . Hypothyroidism 07/28/2011  . Hypertension 07/28/2011  . Hepatic steatosis 07/28/2011    Past Surgical History:  Procedure Laterality Date  .  ANTERIOR CERVICAL DECOMP/DISCECTOMY FUSION N/A 03/23/2017   Procedure: ANTERIOR CERVICAL DECOMPRESSION/DISCECTOMY FUSION, INTERBODY PROSTHESIS,PLATE CERVICAL THREE- CERVICAL FOUR, CERVICAL FOUR- CERVICAL FIVE, CERVICAL FIVE- CERVICAL SIX;  Surgeon: Newman Pies, MD;  Location: [place];  Service: Neurosurgery;  Laterality: N/A;  . APPENDECTOMY    . BACK SURGERY  ,2005   April  2013 - [facility] fusion[facility]  . BREAST SURGERY     breast reduction  . CARDIAC CATHETERIZATION  04/2013   normal coronary arteries and normal LVF  . CARPAL TUNNEL RELEASE  06/05/2012   Procedure: CARPAL TUNNEL RELEASE;  Surgeon: Cammie Sickle.,  MD;  Location: [place];  Service: Orthopedics;  Laterality: Left;  . CESAREAN SECTION     x 2  . CHOLECYSTECTOMY    . COLECTOMY  04/08/2016  . DILATION AND CURETTAGE OF UTERUS    . DORSAL COMPARTMENT RELEASE  06/05/2012   Procedure: RELEASE DORSAL COMPARTMENT (DEQUERVAIN);  Surgeon: Cammie Sickle., MD;  Location: [facility];  Service: Orthopedics;  Laterality: Left;  Excision of mixoid cyst also  . EYE SURGERY     cataracts bilateral  . JOINT REPLACEMENT     Bilateral knee  . KNEE ARTHROPLASTY  09   lft partial  . KNEE ARTHROPLASTY     rt  . LAPAROSCOPIC PARTIAL COLECTOMY N/A 04/08/2016   Procedure: LAPAROSCOPIC ASSISTED ASCENDING COLECTOMY POSSIBLE OPEN COLECTOMY;  Surgeon: Fanny Skates, MD;  Location: [place];  Service: General;  Laterality: N/A;  . orthopedic surgeries     multiple  . REDUCTION MAMMAPLASTY Bilateral   . SHOULDER OPEN ROTATOR CUFF REPAIR     rt and lft  . TEE WITHOUT CARDIOVERSION N/A 11/23/2017   Procedure: TRANSESOPHAGEAL ECHOCARDIOGRAM (TEE);  Surgeon: Jerline Pain, MD;  Location: [facility];  Service: Cardiovascular;  Laterality: N/A;  . TUBAL LIGATION     btsp     OB History    Gravida  2   Para  2   Term  2   Preterm      AB      Living  2     SAB      TAB      Ectopic      Multiple      Live Births               Home Medications    Prior to Admission medications   Medication Sig Start Date End Date Taking? Authorizing Provider  acetaminophen (TYLENOL) 500 MG tablet Take 1,000 mg by mouth every 8 (eight) hours as needed for mild pain or headache.    [provider]  amLODipine (NORVASC) 5 MG tablet Take 5 mg by mouth daily.    [provider]  aspirin 325 MG tablet Take 1 tablet (325 mg total) by mouth daily. 11/26/17   Patrecia Pour, MD  clobetasol (TEMOVATE) 0.05 % external solution Apply 1 application topically See admin instructions. Apply twice daily to scalp and ear  as needed for itching/rash (do not apply to face/groin/underarms) 07/13/16   [provider]  Cyanocobalamin (B-12) 2500 MCG TABS Take 2,500 mcg by mouth daily.    [provider]  cyclobenzaprine (FLEXERIL) 10 MG tablet Take 1 tablet (10 mg total) by mouth 3 (three) times daily as needed for muscle spasms. 03/24/17   Costella, Vista Mink, PA-C  docusate sodium (COLACE) 100 MG capsule Take 1 capsule (100 mg total) by mouth 2 (two) times daily. 11/17/17  Newman Pies, MD  DULoxetine (CYMBALTA) 60 MG capsule Take 120 mg by mouth daily.     [provider]  ferrous sulfate 325 (65 FE) MG tablet Take 1 tablet (325 mg total) by mouth 2 (two) times daily with a meal. 11/17/17   Newman Pies, MD  fluticasone [facility]) 50 MCG/ACT nasal spray Place 2 sprays into both nostrils daily as needed for allergies or rhinitis.    [provider]  furosemide (LASIX) 20 MG tablet Take 20 mg by mouth daily as needed for fluid or edema.     [provider]  JARDIANCE 10 MG TABS tablet Take 10 mg by mouth daily. 07/18/17   [provider]  levothyroxine (SYNTHROID, LEVOTHROID) 150 MCG tablet Take 150 mcg by mouth daily before breakfast. 10/10/17   [provider]  nitrofurantoin, macrocrystal-monohydrate, (MACROBID) 100 MG capsule Take 1 capsule (100 mg total) by mouth 2 (two) times daily. X 7 days 01/22/18   Orpah Greek, MD  NOVOLIN 70/30 RELION (70-30) 100 UNIT/ML injection Inject 35 Units into the skin 2 (two) times daily. 11/25/17   Patrecia Pour, MD  nystatin (MYCOSTATIN) 100000 UNIT/ML suspension Use as directed 5 mLs in the mouth or throat 4 (four) times daily as needed (for tongue burning/mouth sores.).  08/22/17   [provider]  olmesartan (BENICAR) 5 MG tablet Take 5 mg by mouth daily.    [provider]  oxyCODONE (OXY IR/ROXICODONE) 5 MG immediate release tablet Take 1 tablet (5 mg total) by mouth every 4 (four) hours as  needed for moderate pain ((score 4 to 6)). 11/17/17   Newman Pies, MD  pantoprazole (PROTONIX) 40 MG tablet Take 40 mg by mouth 2 (two) times daily.    [provider]  Probiotic Product (PROBIOTIC PO) Take 1 capsule by mouth daily.    [provider]  promethazine (PHENERGAN) 25 MG tablet Take 25 mg by mouth every 8 (eight) hours as needed for nausea or vomiting.    [provider]  rivaroxaban (XARELTO) 20 MG TABS tablet Take 20 mg by mouth daily.    [provider]  Rivaroxaban 15 & 20 MG TBPK Take as directed on package: Start with one 15mg  tablet by mouth twice a day with food. On Day 22, switch to one 20mg  tablet once a day with food. Patient not taking: Reported on 01/08/2018 11/25/17   Patrecia Pour, MD  temazepam (RESTORIL) 15 MG capsule Take 15 mg by mouth at bedtime.  10/10/17   [provider]    Family History Family History  Problem Relation Age of Onset  . Heart attack Father        66s  . Hypothyroidism Father   . Diabetes type II Father   . Diabetes type II Mother   . Hypertension Mother   . Breast cancer Mother 52  . Hypothyroidism Brother   . Breast cancer Maternal Aunt   . Breast cancer Cousin   . Breast cancer Maternal Aunt   . Breast cancer Cousin     Social History Social History   Tobacco Use  . Smoking status: Never Smoker  . Smokeless tobacco: Never Used  Substance Use Topics  . Alcohol use: No  . Drug use: No     Allergies   Codeine; Ambien [zolpidem tartrate]; Crestor [rosuvastatin]; Lipitor [atorvastatin]; Morphine and related; Penicillins; Statins; Dilaudid [hydromorphone hcl]; Heparin; Amitriptyline; Ceftin [cefuroxime axetil]; Lovaza [omega-3-acid ethyl esters]; Lunesta [eszopiclone]; Lyrica [pregabalin]; and Metformin and  related   Review of Systems Review of Systems ROS: Statement: All systems negative except as marked or noted in the HPI; Constitutional: Negative for fever and chills.  +generalized aches. +elevated CBG.; ; Eyes: Negative for eye pain, redness and discharge. ; ; ENMT: Negative for ear pain, hoarseness, nasal congestion, sinus pressure and sore throat. ; ; Cardiovascular: Negative for chest pain, palpitations, diaphoresis, dyspnea and peripheral edema. ; ; Respiratory: Negative for cough, wheezing and stridor. ; ; Gastrointestinal: Negative for nausea, vomiting, diarrhea, abdominal pain, blood in stool, hematemesis, jaundice and rectal bleeding. . ; ; Genitourinary: Negative for dysuria, flank pain and hematuria. ; ; Musculoskeletal: Negative for back pain and neck pain. Negative for swelling and trauma.; ; Skin: Negative for pruritus, rash, abrasions, blisters, bruising and skin lesion.; ; Neuro: Negative for headache, lightheadedness and neck stiffness. Negative for weakness, altered level of consciousness, altered mental status, extremity weakness, paresthesias, involuntary movement, seizure and syncope.; Psych:  +anxiety, grief. No SI, no SA, no HI, no hallucinations.      Physical Exam Updated Vital Signs BP (!) 215/110   Pulse (!) 105   Temp 98.2 F (36.8 C) (Oral)   Resp 16   Ht 5\' 3"  (1.6 m)   Wt 83.9 kg   SpO2 100%   BMI 32.77 kg/m    BP (!) 191/90   Pulse 94   Temp 98.2 F (36.8 C) (Oral)   Resp 18   Ht 5\' 3"  (1.6 m)   Wt 83.9 kg   SpO2 94%   BMI 32.77 kg/m    Physical Exam 2050: Physical examination:  Nursing notes reviewed; Vital signs and O2 SAT reviewed;  Constitutional: Well developed, Well nourished, Well hydrated, Crying.; Head:  Normocephalic, atraumatic; Eyes: EOMI, PERRL, No scleral icterus; ENMT: Mouth and pharynx normal, Mucous membranes moist; Neck: Supple, Full range of motion, No lymphadenopathy; Cardiovascular: Regular rate and rhythm, No gallop; Respiratory: Breath sounds clear & equal bilaterally, No wheezes. Speaking full sentences with ease, Hyperventilating at times. Normal respiratory effort/excursion; Chest: Nontender,  Movement normal; Abdomen: Soft, Nontender, Nondistended, Normal bowel sounds; Genitourinary: No CVA tenderness; Extremities: Peripheral pulses normal, No tenderness, No edema, No calf edema or asymmetry.; Neuro: AA&Ox3, Major CN grossly intact.  Speech clear. No gross focal motor or sensory deficits in extremities.; Skin: Color normal, Warm, Dry.    ED Treatments / Results  Labs (all labs ordered are listed, but only abnormal results are displayed)   EKG None  Radiology   Procedures Procedures (including critical care time)  Medications Ordered in ED Medications  sodium chloride 0.9 % bolus 1,000 mL (has no administration in time range)  LORazepam (ATIVAN) tablet 0.5 mg (has no administration in time range)     Initial Impression / Assessment and Plan / ED Course  I have reviewed the triage vital signs and the nursing notes.  Pertinent labs & imaging results that were available during my care of the patient were reviewed by me and considered in my medical decision making (see chart for details).  MDM Reviewed: previous chart, nursing note and vitals Reviewed previous: labs, ECG, CT scan and MRI Interpretation: labs, ECG, x-ray and CT scan Total time providing critical care: 30-74 minutes. This excludes time spent performing separately reportable procedures and services. Consults: admitting MD   CRITICAL CARE Performed by: Francine Graven Total critical care time: 35 minutes Critical care time was exclusive of separately billable procedures and treating other patients. Critical care was necessary to treat or prevent  imminent or life-threatening deterioration. Critical care was time spent personally by me on the following activities: development of treatment plan with patient and/or surrogate as well as nursing, discussions with consultants, evaluation of patient's response to treatment, examination of patient, obtaining history from patient or surrogate, ordering and  performing treatments and interventions, ordering and review of laboratory studies, ordering and review of radiographic studies, pulse oximetry and re-evaluation of patient's condition.    ED ECG REPORT   Date: 05/15/2018  Rate: 92  Rhythm: normal sinus rhythm  QRS Axis: left  Intervals: normal  ST/T Wave abnormalities: normal  Conduction Disutrbances:none  Narrative Interpretation: Baseline wander  Old EKG Reviewed: unchanged; rate slower otherwise significant changes compared to previous EKG dated 11/19/2017 and 01/21/2018. I have personally reviewed the EKG tracing and agree with the computerized printout as noted.   Results for orders placed or performed during the hospital encounter of 05/15/18  Urinalysis, Routine w reflex microscopic  Result Value Ref Range   Color, Urine STRAW (A) YELLOW   APPearance CLEAR CLEAR   Specific Gravity, Urine 1.018 1.005 - 1.030   pH 7.0 5.0 - 8.0   Glucose, UA >=500 (A) NEGATIVE mg/dL   Hgb urine dipstick NEGATIVE NEGATIVE   Bilirubin Urine NEGATIVE NEGATIVE   Ketones, ur 5 (A) NEGATIVE mg/dL   Protein, ur NEGATIVE NEGATIVE mg/dL   Nitrite NEGATIVE NEGATIVE   Leukocytes, UA NEGATIVE NEGATIVE   RBC / HPF 0-5 0 - 5 RBC/hpf   WBC, UA 0-5 0 - 5 WBC/hpf   Bacteria, UA NONE SEEN NONE SEEN   Squamous Epithelial / LPF 0-5 0 - 5  Comprehensive metabolic panel  Result Value Ref Range   Sodium 132 (L) 135 - 145 mmol/L   Potassium 4.7 3.5 - 5.1 mmol/L   Chloride 103 98 - 111 mmol/L   CO2 20 (L) 22 - 32 mmol/L   Glucose, Bld 660 (HH) 70 - 99 mg/dL   BUN 14 8 - 23 mg/dL   Creatinine, Ser 1.01 (H) 0.44 - 1.00 mg/dL   Calcium 9.0 8.9 - 10.3 mg/dL   Total Protein 6.9 6.5 - 8.1 g/dL   Albumin 4.0 3.5 - 5.0 g/dL   AST 15 15 - 41 U/L   ALT 12 0 - 44 U/L   Alkaline Phosphatase 121 38 - 126 U/L   Total Bilirubin 0.6 0.3 - 1.2 mg/dL   GFR calc non Af Amer 56 (L) >60 mL/min   GFR calc Af Amer >60 >60 mL/min   Anion gap 9 5 - 15  Troponin I  Result  Value Ref Range   Troponin I <0.03 <0.03 ng/mL  CBC with Differential  Result Value Ref Range   WBC 6.3 4.0 - 10.5 K/uL   RBC 4.86 3.87 - 5.11 MIL/uL   Hemoglobin 12.9 12.0 - 15.0 g/dL   HCT 40.0 36.0 - 46.0 %   MCV 82.3 80.0 - 100.0 fL   MCH 26.5 26.0 - 34.0 pg   MCHC 32.3 30.0 - 36.0 g/dL   RDW 15.2 11.5 - 15.5 %   Platelets 226 150 - 400 K/uL   nRBC 0.0 0.0 - 0.2 %   Neutrophils Relative % 80 %   Neutro Abs 5.1 1.7 - 7.7 K/uL   Lymphocytes Relative 13 %   Lymphs Abs 0.8 0.7 - 4.0 K/uL   Monocytes Relative 5 %   Monocytes Absolute 0.3 0.1 - 1.0 K/uL   Eosinophils Relative 1 %   Eosinophils Absolute 0.1  0.0 - 0.5 K/uL   Basophils Relative 1 %   Basophils Absolute 0.0 0.0 - 0.1 K/uL   Immature Granulocytes 0 %   Abs Immature Granulocytes 0.01 0.00 - 0.07 K/uL  Influenza panel by PCR (type A & B)  Result Value Ref Range   Influenza A By PCR NEGATIVE NEGATIVE   Influenza B By PCR NEGATIVE NEGATIVE  CBG monitoring, ED  Result Value Ref Range   Glucose-Capillary 593 (HH) 70 - 99 mg/dL   Comment 1 Document in Chart   CBG monitoring, ED  Result Value Ref Range   Glucose-Capillary 490 (H) 70 - 99 mg/dL   Dg Chest 2 View Result Date: 05/15/2018 CLINICAL DATA:  Fatigue, anxiety. EXAM: CHEST - 2 VIEW COMPARISON:  Radiographs of January 08, 2018. FINDINGS: Stable cardiomediastinal silhouette. No pneumothorax or pleural effusion is noted. Both lungs are clear. The visualized skeletal structures are unremarkable. IMPRESSION: No active cardiopulmonary disease. Electronically Signed   By: Marijo Conception, M.D.   On: 05/15/2018 21:50   Ct Head Wo Contrast Result Date: 05/15/2018 CLINICAL DATA:  Headache.  Hyperglycemia EXAM: CT HEAD WITHOUT CONTRAST TECHNIQUE: Contiguous axial images were obtained from the base of the skull through the vertex without intravenous contrast. COMPARISON:  Multiple exams, including 11/22/2017 FINDINGS: Brain: The brainstem, cerebellum, cerebral peduncles, thalami,  basal ganglia, basilar cisterns, and ventricular system appear within normal limits. Periventricular white matter and corona radiata hypodensities favor chronic ischemic microvascular white matter disease. The previous faint subcortical hypodensity posteriorly along the right frontal lobe is slightly less conspicuous today, seen on image 21/2. No intracranial hemorrhage, mass lesion, or acute CVA. Vascular: Unremarkable Skull: Unremarkable Sinuses/Orbits: Chronic right maxillary sinusitis and mild chronic right ethmoid sinusitis. Other: No supplemental non-categorized findings. IMPRESSION: 1. No acute intracranial findings. 2. Periventricular white matter and corona radiata hypodensities favor chronic ischemic microvascular white matter disease. 3. Findings related to previous known small strokes are less conspicuous on today's exam. 4. Mild chronic right maxillary and ethmoid sinusitis. Electronically Signed   By: Van Clines M.D.   On: 05/15/2018 21:58    2210:  Pt now c/o nausea after ativan. BP continues elevated and pt still anxious/tearful with multiple somatic complaints. IV phenergan ordered. Glucose elevated with normal AG. IV insulin ordered after IVF bolus. Dx and testing d/w pt and family.  Questions answered.  Verb understanding, agreeable to admit.   T/C returned from [facility]. Olevia Bowens, case discussed, including:  HPI, pertinent PM/SHx, VS/PE, dx testing, ED course and treatment:  Agreeable to admit.     Final Clinical Impressions(s) / ED Diagnoses   Final diagnoses:  None    ED Discharge Orders    None       Francine Graven, DO 05/18/18 2110

## 2018-05-16 LAB — GLUCOSE, CAPILLARY
GLUCOSE-CAPILLARY: 92 mg/dL (ref 70–99)
GLUCOSE-CAPILLARY: 86 mg/dL (ref 70–99)
GLUCOSE-CAPILLARY: 122 mg/dL — AB (ref 70–99)
GLUCOSE-CAPILLARY: 150 mg/dL — AB (ref 70–99)
GLUCOSE-CAPILLARY: 150 mg/dL — AB (ref 70–99)
GLUCOSE-CAPILLARY: 347 mg/dL — AB (ref 70–99)
GLUCOSE-CAPILLARY: 246 mg/dL — AB (ref 70–99)
Glucose-Capillary: 162 mg/dL — ABNORMAL HIGH (ref 70–99)
Glucose-Capillary: 164 mg/dL — ABNORMAL HIGH (ref 70–99)
Glucose-Capillary: 165 mg/dL — ABNORMAL HIGH (ref 70–99)
Glucose-Capillary: 166 mg/dL — ABNORMAL HIGH (ref 70–99)
Glucose-Capillary: 250 mg/dL — ABNORMAL HIGH (ref 70–99)
Glucose-Capillary: 278 mg/dL — ABNORMAL HIGH (ref 70–99)
Glucose-Capillary: 380 mg/dL — ABNORMAL HIGH (ref 70–99)
Glucose-Capillary: 420 mg/dL — ABNORMAL HIGH (ref 70–99)

## 2018-05-16 LAB — BASIC METABOLIC PANEL
Anion gap: 4 — ABNORMAL LOW (ref 5–15)
BUN: 12 mg/dL (ref 8–23)
CALCIUM: 9.1 mg/dL (ref 8.9–10.3)
CO2: 28 mmol/L (ref 22–32)
CREATININE: 0.75 mg/dL (ref 0.44–1.00)
Chloride: 108 mmol/L (ref 98–111)
GFR calc non Af Amer: >60 mL/min (ref >60)
Glucose, Bld: 134 mg/dL — ABNORMAL HIGH (ref 70–99)
Potassium: 3.4 mmol/L — ABNORMAL LOW (ref 3.5–5.1)
Sodium: 140 mmol/L (ref 135–145)

## 2018-05-16 LAB — MRSA PCR SCREENING: MRSA by PCR: NEGATIVE

## 2018-05-16 LAB — HEMOGLOBIN A1C
Hgb A1c MFr Bld: 8.2 % — ABNORMAL HIGH (ref 4.8–5.6)
Mean Plasma Glucose: 188.64 mg/dL

## 2018-05-16 MED ORDER — PHENOL 1.4 % MT LIQD
1.0000 | OROMUCOSAL | Status: DC | PRN
  Administered 2018-05-16: 1 via OROMUCOSAL
  Filled 2018-05-16: qty 177

## 2018-05-16 MED ORDER — INSULIN DETEMIR 100 UNIT/ML ~~LOC~~ SOLN
10.0000 [IU] | Freq: Once | SUBCUTANEOUS | Status: AC
  Administered 2018-05-16: 10 [IU] via SUBCUTANEOUS
  Filled 2018-05-16: qty 0.1

## 2018-05-16 MED ORDER — MAGNESIUM SULFATE 2 GM/50ML IV SOLN
2.0000 g | Freq: Once | INTRAVENOUS | Status: AC
  Administered 2018-05-16: 2 g via INTRAVENOUS
  Filled 2018-05-16: qty 50

## 2018-05-16 MED ORDER — POTASSIUM CHLORIDE 10 MEQ/100ML IV SOLN
10.0000 meq | INTRAVENOUS | Status: AC
  Administered 2018-05-16 (×2): 10 meq via INTRAVENOUS
  Filled 2018-05-16 (×2): qty 100

## 2018-05-16 MED ORDER — INSULIN ASPART 100 UNIT/ML ~~LOC~~ SOLN: 0.0000 [IU] | Freq: Every day | SUBCUTANEOUS | Status: DC

## 2018-05-16 MED ORDER — SODIUM CHLORIDE 0.45 % IV SOLN
INTRAVENOUS | Status: DC
  Administered 2018-05-16 – 2018-05-17 (×2): via INTRAVENOUS

## 2018-05-16 MED ORDER — INSULIN ASPART PROT & ASPART (70-30 MIX) 100 UNIT/ML ~~LOC~~ SUSP
25.0000 [IU] | Freq: Every day | SUBCUTANEOUS | Status: DC
  Administered 2018-05-16: 25 [IU] via SUBCUTANEOUS
  Filled 2018-05-16: qty 10

## 2018-05-16 MED ORDER — INSULIN ASPART 100 UNIT/ML ~~LOC~~ SOLN
0.0000 [IU] | Freq: Three times a day (TID) | SUBCUTANEOUS | Status: DC
  Administered 2018-05-16: 11 [IU] via SUBCUTANEOUS
  Administered 2018-05-16: 3 [IU] via SUBCUTANEOUS
  Administered 2018-05-17: 8 [IU] via SUBCUTANEOUS

## 2018-05-16 MED ORDER — INSULIN REGULAR BOLUS VIA INFUSION
10.0000 [IU] | Freq: Once | INTRAVENOUS | Status: AC
  Administered 2018-05-16: 10 [IU] via INTRAVENOUS
  Filled 2018-05-16: qty 10

## 2018-05-16 NOTE — Progress Notes (Signed)
PROGRESS NOTE    DANNY PONCEDELEON  VFI:[id number] DOB: 11/29/1950 DOA: 05/15/2018 PCP: [name], MD   Brief Narrative:  Per HPI from Dr. Robb Matar 10/8: ARLESIA ZACCARIA is a 67 y.o. female with medical history significant of history of iron deficiency anemia, osteoarthritis, cervical spondylosis, chronic back pain, history of cervical spine and back surgery x3, depression with anxiety, diastolic dysfunction, diverticulosis with history of diverticulitis, history of lower extremities DVT x2, endometriosis, fibromyalgia, GERD,/hiatal hernia, history of urolithiasis, hyperlipidemia, hypertension, hypothyroidism, insomnia, mild aortic insufficiency, ovarian cyst, restless leg syndrome, history of thoracic compression fracture, history of uterine fibroids, vitamin D deficiency, villous adenoma of the right colon, history of a UTI who is coming to the emergency department due to having elevated glucose, a splitting headache and anxiety after she witnessed her brother passing away just before receiving CPR for a while by EMS.  The patient became very anxious, tremulous and started hyperventilating.  She forgot to administer her evening dose of insulin.  Then when his CBG was checked by EMS on seeing it was reading high.   She was admitted with a diabetic hyperosmolar nonketotic state and started on insulin drip as well as IV fluid.  Her blood glucose levels had resolved by the morning she was transitioned to a sliding scale insulin and started on a diet, but she continued to have labile blood glucose readings noted.  Assessment & Plan:   Principal Problem:   Diabetic hyperosmolar non-ketotic state (HCC) Active Problems:   Hypothyroidism   Hypertension   Diastolic dysfunction   Hyperlipidemia   GERD (gastroesophageal reflux disease)   Depression with anxiety   1. Diabetic hyperosmolar nonketotic state.  This is now resolved and appears to be attributed to missing her home dose of insulin given recent  events with her brother passing.  Continue on sliding scale insulin coverage as well as carb modified diet and resumption of 70/30 insulin at 25 units tonight per recommendations by diabetes coordinator.  Anticipate discharge on 38 units twice daily in a.m. per her endocrinologist Dr. Sharl Ma. 2. Mild hypokalemia.  Replete with IV pushes and recheck labs in a.m. to include magnesium. 3. Hypothyroidism.  Continue levothyroxine. 4. Hypertension.  Continue amlodipine and valsartan and continue to monitor blood pressure readings.  Patient has been compliant with her blood pressure medications at home and seems to have some borderline elevated readings. 5. Diastolic dysfunction.  No signs of decompensation at this time with furosemide as needed. 6. Dyslipidemia.  Continue nonformulary statin prescription. 7. GERD.  Continue Protonix. 8. Depression with anxiety.  Continue Cymbalta, temazepam, and lorazepam pushes as needed for anxiety.   DVT prophylaxis: Xarelto Code Status: Full Family Communication: None at bedside Disposition Plan: Continue on sliding scale insulin and manage home 70/30 insulin with anticipated discharge in a.m. if stable.   Consultants:   None  Procedures:   None  Antimicrobials:   None   Subjective: Patient seen and evaluated today with no new acute complaints or concerns. No acute concerns or events noted overnight.  She denies any abdominal pain, nausea, or vomiting.  Denies any further headache at this time.  Objective: Vitals:   05/16/18 0900 05/16/18 1000 05/16/18 1100 05/16/18 1157  BP: (!) 167/90 (!) 162/90    Pulse: 93 84 89   Resp: 15 18 17    Temp:    97.7 F (36.5 C)  TempSrc:    Axillary  SpO2: 95% 96% 94%   Weight:  Height:        Intake/Output Summary (Last 24 hours) at 05/16/2018 1454 Last data filed at 05/16/2018 1440 Gross per 24 hour  Intake 3157.86 ml  Output 3300 ml  Net -142.14 ml   Filed Weights   05/15/18 2042 05/16/18 0400    Weight: 83.9 kg 88.4 kg    Examination:  General exam: Appears calm and comfortable  Respiratory system: Clear to auscultation. Respiratory effort normal. Cardiovascular system: S1 & S2 heard, RRR. No JVD, murmurs, rubs, gallops or clicks. No pedal edema. Gastrointestinal system: Abdomen is nondistended, soft and nontender. No organomegaly or masses felt. Normal bowel sounds heard. Central nervous system: Alert and oriented. No focal neurological deficits. Extremities: Symmetric 5 x 5 power. Skin: No rashes, lesions or ulcers Psychiatry: Judgement and insight appear normal. Mood & affect appropriate.     Data Reviewed: I have personally reviewed following labs and imaging studies  CBC: Recent Labs  Lab 05/15/18 2053  WBC 6.3  NEUTROABS 5.1  HGB 12.9  HCT 40.0  MCV 82.3  PLT 226   Basic Metabolic Panel: Recent Labs  Lab 05/15/18 2053 05/15/18 2250 05/16/18 0234  NA 132* 135 140  K 4.7 4.2 3.4*  CL 103 103 108  CO2 20* 24 28  GLUCOSE 660* 577* 134*  BUN 14 14 12   CREATININE 1.01* 0.97 0.75  CALCIUM 9.0 9.1 9.1  MG  --  1.8  --   PHOS  --  3.4  --    GFR: Estimated Creatinine Clearance: 72 mL/min (by C-G formula based on SCr of 0.75 mg/dL). Liver Function Tests: Recent Labs  Lab 05/15/18 2053  AST 15  ALT 12  ALKPHOS 121  BILITOT 0.6  PROT 6.9  ALBUMIN 4.0   No results for input(s): LIPASE, AMYLASE in the last 168 hours. No results for input(s): AMMONIA in the last 168 hours. Coagulation Profile: No results for input(s): INR, PROTIME in the last 168 hours. Cardiac Enzymes: Recent Labs  Lab 05/15/18 2053  TROPONINI <0.03   BNP (last 3 results) No results for input(s): PROBNP in the last 8760 hours. HbA1C: No results for input(s): HGBA1C in the last 72 hours. CBG: Recent Labs  Lab 05/16/18 0917 05/16/18 1012 05/16/18 1156 05/16/18 1346 05/16/18 1445  GLUCAP 250* 380* 347* 246* 164*   Lipid Profile: No results for input(s): CHOL, HDL,  LDLCALC, TRIG, CHOLHDL, LDLDIRECT in the last 72 hours. Thyroid Function Tests: No results for input(s): TSH, T4TOTAL, FREET4, T3FREE, THYROIDAB in the last 72 hours. Anemia Panel: No results for input(s): VITAMINB12, FOLATE, FERRITIN, TIBC, IRON, RETICCTPCT in the last 72 hours. Sepsis Labs: No results for input(s): PROCALCITON, LATICACIDVEN in the last 168 hours.  Recent Results (from the past 240 hour(s))  MRSA PCR Screening     Status: None   Collection Time: 05/15/18 11:20 PM  Result Value Ref Range Status   MRSA by PCR NEGATIVE NEGATIVE Final    Comment:        The GeneXpert MRSA Assay (FDA approved for NASAL specimens only), is one component of a comprehensive MRSA colonization surveillance program. It is not intended to diagnose MRSA infection nor to guide or monitor treatment for MRSA infections. Performed at [facility], [address] [place], [place] [address]          Radiology Studies: Dg Chest 2 View  Result Date: 05/15/2018 CLINICAL DATA:  Fatigue, anxiety. EXAM: CHEST - 2 VIEW COMPARISON:  Radiographs of January 08, 2018. FINDINGS: Stable cardiomediastinal  silhouette. No pneumothorax or pleural effusion is noted. Both lungs are clear. The visualized skeletal structures are unremarkable. IMPRESSION: No active cardiopulmonary disease. Electronically Signed   By: Lupita Raider, M.D.   On: 05/15/2018 21:50   Ct Head Wo Contrast  Result Date: 05/15/2018 CLINICAL DATA:  Headache.  Hyperglycemia EXAM: CT HEAD WITHOUT CONTRAST TECHNIQUE: Contiguous axial images were obtained from the base of the skull through the vertex without intravenous contrast. COMPARISON:  Multiple exams, including 11/22/2017 FINDINGS: Brain: The brainstem, cerebellum, cerebral peduncles, thalami, basal ganglia, basilar cisterns, and ventricular system appear within normal limits. Periventricular white matter and corona radiata hypodensities favor chronic ischemic microvascular white matter  disease. The previous faint subcortical hypodensity posteriorly along the right frontal lobe is slightly less conspicuous today, seen on image 21/2. No intracranial hemorrhage, mass lesion, or acute CVA. Vascular: Unremarkable Skull: Unremarkable Sinuses/Orbits: Chronic right maxillary sinusitis and mild chronic right ethmoid sinusitis. Other: No supplemental non-categorized findings. IMPRESSION: 1. No acute intracranial findings. 2. Periventricular white matter and corona radiata hypodensities favor chronic ischemic microvascular white matter disease. 3. Findings related to previous known small strokes are less conspicuous on today's exam. 4. Mild chronic right maxillary and ethmoid sinusitis. Electronically Signed   By: Gaylyn Rong M.D.   On: 05/15/2018 21:58        Scheduled Meds: . amLODipine  2.5 mg Oral Daily  . aspirin EC  81 mg Oral Daily  . docusate sodium  100 mg Oral BID  . DULoxetine  120 mg Oral Daily  . ferrous sulfate  325 mg Oral BID WC  . gabapentin  300 mg Oral QHS  . Influenza vac split quadrivalent PF  0.5 mL Intramuscular Tomorrow-1000  . insulin aspart  0-15 Units Subcutaneous TID WC  . insulin aspart  0-5 Units Subcutaneous QHS  . insulin aspart protamine- aspart  25 Units Subcutaneous Q supper  . irbesartan  37.5 mg Oral Daily  . levothyroxine  150 mcg Oral QAC breakfast  . pantoprazole  40 mg Oral Daily  . rivaroxaban  20 mg Oral Daily  . temazepam  15 mg Oral QHS  . vitamin B-12  2,500 mcg Oral Daily   Continuous Infusions: . sodium chloride 75 mL/hr at 05/16/18 1053     LOS: 1 day    Time spent: 30 minutes    Dannetta Lekas Hoover Brunette, DO [facility] Pager [phone]  If 7PM-7AM, please contact night-coverage [email or web address] Password [email or web address] 05/16/2018, 2:54 PM

## 2018-05-16 NOTE — Progress Notes (Signed)
Inpatient Diabetes Program Recommendations  AACE/ADA: New Consensus Statement on Inpatient Glycemic Control (2015)  Target Ranges:  Prepandial:   less than 140 mg/dL      Peak postprandial:   less than 180 mg/dL (1-2 hours)      Critically ill patients:  140 - 180 mg/dL   Lab Results  Component Value Date   GLUCAP 347 (H) 05/16/2018   HGBA1C 7.4 (H) 11/22/2017    Review of Glycemic Control Results for [name], [name] (MRN [id number]) as of 05/16/2018 12:32  Ref. Range 05/16/2018 08:05 05/16/2018 09:17 05/16/2018 10:12 05/16/2018 11:56  Glucose-Capillary Latest Ref Range: 70 - 99 mg/dL 150 (H) 250 (H) 380 (H) 347 (H)   Diabetes history: Type 2 DM Outpatient Diabetes medications: Novolin 70/30 35 units BID Current orders for Inpatient glycemic control: Novolog 0-15 units TID, Novolog 0-5 units QHS Levemir 10 units X 1 @0910   Inpatient Diabetes Program Recommendations:     Last A1C was from 11/2017, noted A1C pending.   In preparation for discharge, could restart Novolin 70/30 25 units @1700  with supper.   Noted consult. Patient just transitioned off insulin drip; current BS was 347 mg/dL.  -0808-Correction not given- Per RN in [facility]- Did not administer d/t pt still being on insulin drip. -0910- Pt eating tray- Per RN patient received tray that was not carb modified. Est. Carb counting exceeds 60 g. Thus explaining increase to BS trends. -1215- Per RN- Insulin drip stopped at 1215.   Attempted to reach out to the patient given admitting BS. No answer. RN at bedside and patient is snoring. Will reach back out this afternoon to establish if patient was taking home meds for D/C recommendations, given working remotely at [employer].  Addendum @1400 : spoke with patient regarding home management of diabetes. Patient is followed by Dr Buddy Duty, endocrinologist and is scheduled for follow up on the 25th.  Patient reports taking her home medications without missing dosages. Her last self injection  insulin dose was at 0700 on 10/8, thus she missed her later dose d/t not eating, feeling poorly and arrival to ED. Per the patient, patient takes 38 units BID of 70/30.   Explained what a A1c is and what it measures. Also reviewed goal A1c with patient, importance of good glucose control @ home, and blood sugar goals. Reviewed patho of DM, sick day rules, interventions for hypoglycemia, survival skills and comorbidites. Informed patient that her A1C is still pending the results.   Patient has a meter and testing supplies. Reports checking twice per day. She reports having low blood sugars in the middle of the night occasionally since Dr Buddy Duty increased the dose to 38 units BID from 35 units BID. Patient has awareness of hypoglycemia beginning in the 60's and is able to verbalize appropriately interventions. Denies having BS exceeding 200's mg/dL, unless she eats increased carbs.  Educated patient on the importance of avoiding low BS and that she should contact [facility]'s office if she continues to have more than one low blood sugar a week. Patient agrees. Adamant she wants to keep her outpatient dose the same as what they agreed upon outpatient. [facility] consulted. Text paged MD regarding recs.  No further questions at this time.  Thanks, Bronson Curb, MSN, RNC-OB Diabetes Coordinator [phone] (8a-5p)

## 2018-05-17 LAB — BASIC METABOLIC PANEL
Anion gap: 7 (ref 5–15)
BUN: 12 mg/dL (ref 8–23)
CALCIUM: 9.1 mg/dL (ref 8.9–10.3)
CO2: 27 mmol/L (ref 22–32)
CREATININE: 0.86 mg/dL (ref 0.44–1.00)
Chloride: 106 mmol/L (ref 98–111)
GFR calc Af Amer: >60 mL/min (ref >60)
Glucose, Bld: 152 mg/dL — ABNORMAL HIGH (ref 70–99)
POTASSIUM: 4.4 mmol/L (ref 3.5–5.1)
SODIUM: 140 mmol/L (ref 135–145)

## 2018-05-17 LAB — GLUCOSE, CAPILLARY
Glucose-Capillary: 285 mg/dL — ABNORMAL HIGH (ref 70–99)
Glucose-Capillary: 259 mg/dL — ABNORMAL HIGH (ref 70–99)

## 2018-05-17 LAB — URINE CULTURE: Culture: <10000 — AB

## 2018-05-17 LAB — MAGNESIUM: Magnesium: 1.8 mg/dL (ref 1.7–2.4)

## 2018-05-17 LAB — HIV ANTIBODY (ROUTINE TESTING W REFLEX): HIV Screen 4th Generation wRfx: NONREACTIVE

## 2018-05-17 MED ORDER — INSULIN ASPART PROT & ASPART (70-30 MIX) 100 UNIT/ML ~~LOC~~ SUSP
38.0000 [IU] | Freq: Two times a day (BID) | SUBCUTANEOUS | Status: DC
  Administered 2018-05-17: 38 [IU] via SUBCUTANEOUS
  Filled 2018-05-17: qty 10

## 2018-05-17 MED ORDER — INSULIN ASPART PROT & ASPART (70-30 MIX) 100 UNIT/ML ~~LOC~~ SUSP: 38.0000 [IU] | Freq: Two times a day (BID) | SUBCUTANEOUS | 11 refills | Status: DC

## 2018-05-17 MED ORDER — LORAZEPAM 0.5 MG PO TABS: 0.5000 mg | ORAL_TABLET | Freq: Three times a day (TID) | ORAL | 0 refills | Status: AC | PRN

## 2018-05-17 NOTE — Discharge Summary (Signed)
Physician Discharge Summary  [name] RWE:[id number] DOB: 10-Oct-1950 DOA: 05/15/2018  PCP: Morgan Stains, MD  Admit date: 05/15/2018  Discharge date: 05/17/2018  Admitted From:Home  Disposition:  Home  Recommendations for Outpatient Follow-up:  1. Follow up with PCP in 1-2 weeks 2. Follow-up with endocrinologist Dr. Buddy [name] as noted on the 25th and continue Novolin 70/30 at 38 units twice daily as previously prescribed. 3. Ativan given as needed for anxiety.  Home Health: None  Equipment/Devices: None  Discharge Condition: Stable  CODE STATUS: Full  Diet recommendation: Heart Healthy/carb modified  Brief/Interim Summary:  Per HPI from Dr. Olevia [name] 10/8: Morgan Lore Mooreis a 67 y.o.femalewith medical history significant of history of iron deficiency anemia, osteoarthritis, cervical spondylosis, chronic back pain, history of cervical spine and back surgery x3, depression with anxiety, diastolic dysfunction, diverticulosis with history of diverticulitis, history of lower extremities DVT x2, endometriosis, fibromyalgia, GERD,/hiatal hernia, history of urolithiasis, hyperlipidemia, hypertension, hypothyroidism, insomnia, mild aortic insufficiency, ovarian cyst, restless leg syndrome, history of thoracic compression fracture, history of uterine fibroids, vitamin D deficiency, villous adenoma of the right colon, history of a UTI who is coming to the emergency department due to having elevated glucose, a splitting headache and anxiety after she witnessed her brother passing away just before receiving CPR for a while by EMS. The patient became very anxious, tremulous and started hyperventilating. She forgot to administer her evening dose of insulin. Then when his CBG was checked by EMS on seeing it was reading high.   She was admitted with a diabetic hyperosmolar nonketotic state and started on insulin drip as well as IV fluid.  Her blood glucose levels had resolved by the morning she was  transitioned to a sliding scale insulin and started on a diet, but she continued to have labile blood glucose readings noted.  She now has stable readings on the day of discharge and will be resumed on her usual Novolin 70/30 at 38 units twice daily and we have encouraged follow-up with her endocrinologist as scheduled.  No other acute events have been noted during the course of this admission.  Discharge Diagnoses:  Principal Problem:   Diabetic hyperosmolar non-ketotic state ([place]) Active Problems:   Hypothyroidism   Hypertension   Diastolic dysfunction   Hyperlipidemia   GERD (gastroesophageal reflux disease)   Depression with anxiety    Discharge Instructions  Discharge Instructions    Diet - low sodium heart healthy   Complete by:  As directed    Increase activity slowly   Complete by:  As directed      Allergies as of 05/17/2018      Reactions   Codeine Nausea And Vomiting   Ambien [zolpidem Tartrate] Other (See Comments)   Up sleep walking and eating   Crestor [rosuvastatin] Other (See Comments)   Muscle weakness, cramps and aching all over body   Lipitor [atorvastatin] Itching, Other (See Comments)   Muscle weakness, cramps and aches all over body   Morphine And Related Other (See Comments)   Flushing and feeling hot Tolerates with Diphenhydramine   Penicillins Rash, Other (See Comments)   Caused Headaches also Has patient had a PCN reaction causing immediate rash, facial/tongue/throat swelling, SOB or lightheadedness with hypotension: No Has patient had a PCN reaction causing severe rash involving mucus membranes or skin necrosis: No Has patient had a PCN reaction that required hospitalization No Has patient had a PCN reaction occurring within the last 10 years: No If all of the above answers  are "NO", then may proceed with Cephalosporin use.   Statins Other (See Comments)   Muscle weakness, cramps and aching all over body   Dilaudid [hydromorphone Hcl] Other (See  Comments)   Hallucinations/ argumentative, goes beserk   Heparin Other (See Comments)   HIT ab positive   Amitriptyline Other (See Comments)   Loopy feeling   Ceftin [cefuroxime Axetil] Other (See Comments)   Stomach pain    Lovaza [omega-3-acid Ethyl Esters] Other (See Comments)   Stomach pain    Lunesta [eszopiclone] Other (See Comments)   Causes dizziness   Lyrica [pregabalin] Nausea Only   Metformin And Related Nausea Only   Pt denies this intolerance      Medication List    STOP taking these medications   NOVOLIN 70/30 RELION (70-30) 100 UNIT/ML injection Generic drug:  insulin NPH-regular Human Replaced by:  insulin aspart protamine- aspart (70-30) 100 UNIT/ML injection     TAKE these medications   acetaminophen 500 MG tablet Commonly known as:  TYLENOL Take 1,000 mg by mouth every 8 (eight) hours as needed for mild pain or headache.   amLODipine 5 MG tablet Commonly known as:  NORVASC Take 2.5 mg by mouth daily.   aspirin EC 81 MG tablet Take 81 mg by mouth daily.   B-12 2500 MCG Tabs Take 2,500 mcg by mouth daily.   clobetasol 0.05 % external solution Commonly known as:  TEMOVATE Apply 1 application topically See admin instructions. Apply twice daily to scalp and ear as needed for itching/rash (do not apply to face/groin/underarms)   cyclobenzaprine 10 MG tablet Commonly known as:  FLEXERIL Take 1 tablet (10 mg total) by mouth 3 (three) times daily as needed for muscle spasms.   DEXILANT 60 MG capsule Generic drug:  dexlansoprazole Take 60 mg by mouth daily.   docusate sodium 100 MG capsule Commonly known as:  COLACE Take 1 capsule (100 mg total) by mouth 2 (two) times daily.   DULoxetine 60 MG capsule Commonly known as:  CYMBALTA Take 120 mg by mouth daily.   ferrous sulfate 325 (65 FE) MG tablet Take 1 tablet (325 mg total) by mouth 2 (two) times daily with a meal.   fluticasone 50 MCG/ACT nasal spray Commonly known as:  FLONASE Place 2  sprays into both nostrils daily as needed for allergies or rhinitis.   furosemide 20 MG tablet Commonly known as:  LASIX Take 20 mg by mouth daily as needed for fluid or edema.   gabapentin 300 MG capsule Commonly known as:  NEURONTIN Take 300 mg by mouth at bedtime. *May take 600mg  at bedtime if needed   insulin aspart protamine- aspart (70-30) 100 UNIT/ML injection Commonly known as:  NOVOLOG MIX 70/30 Inject 0.38 mLs (38 Units total) into the skin 2 (two) times daily with a meal. Replaces:  NOVOLIN 70/30 RELION (70-30) 100 UNIT/ML injection   levothyroxine 150 MCG tablet Commonly known as:  SYNTHROID, LEVOTHROID Take 150 mcg by mouth daily before breakfast.   LIVALO 2 MG Tabs Generic drug:  Pitavastatin Calcium Take 2 mg by mouth every Wednesday.   LORazepam 0.5 MG tablet Commonly known as:  ATIVAN Take 1 tablet (0.5 mg total) by mouth every 8 (eight) hours as needed for anxiety.   oxyCODONE 5 MG immediate release tablet Commonly known as:  Oxy IR/ROXICODONE Take 1 tablet (5 mg total) by mouth every 4 (four) hours as needed for moderate pain ((score 4 to 6)).   PROBIOTIC PO Take 1 capsule  by mouth daily.   promethazine 25 MG tablet Commonly known as:  PHENERGAN Take 25 mg by mouth every 8 (eight) hours as needed for nausea or vomiting.   rivaroxaban 20 MG Tabs tablet Commonly known as:  XARELTO Take 20 mg by mouth daily.   temazepam 15 MG capsule Commonly known as:  RESTORIL Take 15 mg by mouth at bedtime.   valsartan 40 MG tablet Commonly known as:  DIOVAN Take 40 mg by mouth every evening.      Follow-up Information    Morgan Stains, MD Follow up in 2 week(s).   Specialty:  Family Medicine Contact information: [place] [address] [phone]          Allergies  Allergen Reactions  . Codeine Nausea And Vomiting  . Ambien [Zolpidem Tartrate] Other (See Comments)    Up sleep walking and eating  . Crestor  [Rosuvastatin] Other (See Comments)    Muscle weakness, cramps and aching all over body  . Lipitor [Atorvastatin] Itching and Other (See Comments)    Muscle weakness, cramps and aches all over body  . Morphine And Related Other (See Comments)    Flushing and feeling hot Tolerates with Diphenhydramine  . Penicillins Rash and Other (See Comments)    Caused Headaches also Has patient had a PCN reaction causing immediate rash, facial/tongue/throat swelling, SOB or lightheadedness with hypotension: No Has patient had a PCN reaction causing severe rash involving mucus membranes or skin necrosis: No Has patient had a PCN reaction that required hospitalization No Has patient had a PCN reaction occurring within the last 10 years: No If all of the above answers are "NO", then may proceed with Cephalosporin use.    . Statins Other (See Comments)    Muscle weakness, cramps and aching all over body  . Dilaudid [Hydromorphone Hcl] Other (See Comments)    Hallucinations/ argumentative, goes beserk  . Heparin Other (See Comments)    HIT ab positive  . Amitriptyline Other (See Comments)    Loopy feeling  . Ceftin [Cefuroxime Axetil] Other (See Comments)    Stomach pain   . Lovaza [Omega-3-Acid Ethyl Esters] Other (See Comments)    Stomach pain   . Lunesta [Eszopiclone] Other (See Comments)    Causes dizziness  . Lyrica [Pregabalin] Nausea Only  . Metformin And Related Nausea Only    Pt denies this intolerance    Consultations:  None   Procedures/Studies: Dg Chest 2 View  Result Date: 05/15/2018 CLINICAL DATA:  Fatigue, anxiety. EXAM: CHEST - 2 VIEW COMPARISON:  Radiographs of January 08, 2018. FINDINGS: Stable cardiomediastinal silhouette. No pneumothorax or pleural effusion is noted. Both lungs are clear. The visualized skeletal structures are unremarkable. IMPRESSION: No active cardiopulmonary disease. Electronically Signed   By: Marijo Conception, M.D.   On: 05/15/2018 21:50   Ct Head Wo  Contrast  Result Date: 05/15/2018 CLINICAL DATA:  Headache.  Hyperglycemia EXAM: CT HEAD WITHOUT CONTRAST TECHNIQUE: Contiguous axial images were obtained from the base of the skull through the vertex without intravenous contrast. COMPARISON:  Multiple exams, including 11/22/2017 FINDINGS: Brain: The brainstem, cerebellum, cerebral peduncles, thalami, basal ganglia, basilar cisterns, and ventricular system appear within normal limits. Periventricular white matter and corona radiata hypodensities favor chronic ischemic microvascular white matter disease. The previous faint subcortical hypodensity posteriorly along the right frontal lobe is slightly less conspicuous today, seen on image 21/2. No intracranial hemorrhage, mass lesion, or acute CVA. Vascular: Unremarkable Skull: Unremarkable Sinuses/Orbits:  Chronic right maxillary sinusitis and mild chronic right ethmoid sinusitis. Other: No supplemental non-categorized findings. IMPRESSION: 1. No acute intracranial findings. 2. Periventricular white matter and corona radiata hypodensities favor chronic ischemic microvascular white matter disease. 3. Findings related to previous known small strokes are less conspicuous on today's exam. 4. Mild chronic right maxillary and ethmoid sinusitis. Electronically Signed   By: Van Clines M.D.   On: 05/15/2018 21:58     Discharge Exam: Vitals:   05/17/18 0738 05/17/18 0800  BP:  138/70  Pulse:  81  Resp:  17  Temp: 98.1 F (36.7 C)   SpO2:  94%   Vitals:   05/17/18 0500 05/17/18 0639 05/17/18 0738 05/17/18 0800  BP:  (!) 182/84  138/70  Pulse:  81  81  Resp:  (!) 21  17  Temp:   98.1 F (36.7 C)   TempSrc:   Oral   SpO2:  93%  94%  Weight: 88.4 kg     Height:        General: Pt is alert, awake, not in acute distress Cardiovascular: RRR, S1/S2 +, no rubs, no gallops Respiratory: CTA bilaterally, no wheezing, no rhonchi Abdominal: Soft, NT, ND, bowel sounds + Extremities: no edema, no  cyanosis    The results of significant diagnostics from this hospitalization (including imaging, microbiology, ancillary and laboratory) are listed below for reference.     Microbiology: Recent Results (from the past 240 hour(s))  Urine culture     Status: Abnormal   Collection Time: 05/15/18  8:42 PM  Result Value Ref Range Status   Specimen Description   Final    URINE, RANDOM Performed at [facility], [address], [place], [place] [address]    Special Requests   Final    NONE Performed at [facility], [address], [place], [place] [address]    Culture (A)  Final    <10,000 COLONIES/mL INSIGNIFICANT GROWTH Performed at [facility] [address] [place], [place] [address]    Report Status 05/17/2018 FINAL  Final  MRSA PCR Screening     Status: None   Collection Time: 05/15/18 11:20 PM  Result Value Ref Range Status   MRSA by PCR NEGATIVE NEGATIVE Final    Comment:        The GeneXpert MRSA Assay (FDA approved for NASAL specimens only), is one component of a comprehensive MRSA colonization surveillance program. It is not intended to diagnose MRSA infection nor to guide or monitor treatment for MRSA infections. Performed at [facility], [address] [place], [place] [address]      Labs: BNP (last 3 results) Recent Labs    11/18/17 1528  BNP 1,093.2*   Basic Metabolic Panel: Recent Labs  Lab 05/15/18 2053 05/15/18 2250 05/16/18 0234 05/17/18 0401  NA 132* 135 140 140  K 4.7 4.2 3.4* 4.4  CL 103 103 108 106  CO2 20* 24 28 27   GLUCOSE 660* 577* 134* 152*  BUN 14 14 12 12   CREATININE 1.01* 0.97 0.75 0.86  CALCIUM 9.0 9.1 9.1 9.1  MG  --  1.8  --  1.8  PHOS  --  3.4  --   --    Liver Function Tests: Recent Labs  Lab 05/15/18 2053  AST 15  ALT 12  ALKPHOS 121  BILITOT 0.6  PROT 6.9  ALBUMIN 4.0   No results for input(s): LIPASE, AMYLASE in the last 168 hours. No results for input(s): AMMONIA in the  last 168  hours. CBC: Recent Labs  Lab 05/15/18 2053  WBC 6.3  NEUTROABS 5.1  HGB 12.9  HCT 40.0  MCV 82.3  PLT 226   Cardiac Enzymes: Recent Labs  Lab 05/15/18 2053  TROPONINI <0.03   BNP: Invalid input(s): POCBNP CBG: Recent Labs  Lab 05/16/18 1346 05/16/18 1445 05/16/18 1610 05/16/18 2124 05/17/18 0741  GLUCAP 246* 164* 166* 162* 285*   D-Dimer No results for input(s): DDIMER in the last 72 hours. Hgb A1c Recent Labs    05/16/18 0717  HGBA1C 8.2*   Lipid Profile No results for input(s): CHOL, HDL, LDLCALC, TRIG, CHOLHDL, LDLDIRECT in the last 72 hours. Thyroid function studies No results for input(s): TSH, T4TOTAL, T3FREE, THYROIDAB in the last 72 hours.  Invalid input(s): FREET3 Anemia work up No results for input(s): VITAMINB12, FOLATE, FERRITIN, TIBC, IRON, RETICCTPCT in the last 72 hours. Urinalysis    Component Value Date/Time   COLORURINE STRAW (A) 05/15/2018 2042   APPEARANCEUR CLEAR 05/15/2018 2042   LABSPEC 1.018 05/15/2018 2042   PHURINE 7.0 05/15/2018 2042   GLUCOSEU >=500 (A) 05/15/2018 2042   HGBUR NEGATIVE 05/15/2018 2042   BILIRUBINUR NEGATIVE 05/15/2018 2042   KETONESUR 5 (A) 05/15/2018 2042   PROTEINUR NEGATIVE 05/15/2018 2042   UROBILINOGEN 0.2 07/30/2012 2228   NITRITE NEGATIVE 05/15/2018 2042   LEUKOCYTESUR NEGATIVE 05/15/2018 2042   Sepsis Labs Invalid input(s): PROCALCITONIN,  WBC,  LACTICIDVEN Microbiology Recent Results (from the past 240 hour(s))  Urine culture     Status: Abnormal   Collection Time: 05/15/18  8:42 PM  Result Value Ref Range Status   Specimen Description   Final    URINE, RANDOM Performed at [facility], [address], [place], [place] [address]    Special Requests   Final    NONE Performed at [facility], [address], [place], [place] [address]    Culture (A)  Final    <10,000 COLONIES/mL INSIGNIFICANT GROWTH Performed at [facility], [place] [address], [place], [place] [address]    Report  Status 05/17/2018 FINAL  Final  MRSA PCR Screening     Status: None   Collection Time: 05/15/18 11:20 PM  Result Value Ref Range Status   MRSA by PCR NEGATIVE NEGATIVE Final    Comment:        The GeneXpert MRSA Assay (FDA approved for NASAL specimens only), is one component of a comprehensive MRSA colonization surveillance program. It is not intended to diagnose MRSA infection nor to guide or monitor treatment for MRSA infections. Performed at [facility], [address] [place],  [address]      Time coordinating discharge: 35 minutes  SIGNED:   Rodena Goldmann, DO [facility] 05/17/2018, 10:47 AM Pager [phone]  If 7PM-7AM, please contact night-coverage [email or web address] Password [email or web address]

## 2018-05-17 NOTE — Progress Notes (Signed)
Pt discharged home. PIV removed; no complications. Instructions including medications and follow up appointments explained to pt. All questions answered, understanding verbalized. Pt taken out via [place] by NT with belongings.

## 2018-05-21 DIAGNOSIS — Z0000 Encounter for general adult medical examination without abnormal findings: Secondary | ICD-10-CM | POA: Diagnosis not present

## 2018-05-21 DIAGNOSIS — I700 Atherosclerosis of aorta: Secondary | ICD-10-CM | POA: Diagnosis not present

## 2018-05-21 DIAGNOSIS — E669 Obesity, unspecified: Secondary | ICD-10-CM | POA: Diagnosis not present

## 2018-05-21 DIAGNOSIS — E2839 Other primary ovarian failure: Secondary | ICD-10-CM | POA: Diagnosis not present

## 2018-05-21 DIAGNOSIS — Z23 Encounter for immunization: Secondary | ICD-10-CM | POA: Diagnosis not present

## 2018-05-21 DIAGNOSIS — E039 Hypothyroidism, unspecified: Secondary | ICD-10-CM | POA: Diagnosis not present

## 2018-05-21 DIAGNOSIS — E785 Hyperlipidemia, unspecified: Secondary | ICD-10-CM | POA: Diagnosis not present

## 2018-05-21 DIAGNOSIS — I10 Essential (primary) hypertension: Secondary | ICD-10-CM | POA: Diagnosis not present

## 2018-05-21 DIAGNOSIS — E1165 Type 2 diabetes mellitus with hyperglycemia: Secondary | ICD-10-CM | POA: Diagnosis not present

## 2018-05-21 DIAGNOSIS — F4321 Adjustment disorder with depressed mood: Secondary | ICD-10-CM | POA: Diagnosis not present

## 2018-05-21 DIAGNOSIS — E1169 Type 2 diabetes mellitus with other specified complication: Secondary | ICD-10-CM | POA: Diagnosis not present

## 2018-05-21 DIAGNOSIS — G4700 Insomnia, unspecified: Secondary | ICD-10-CM | POA: Diagnosis not present

## 2018-05-23 ENCOUNTER — Other Ambulatory Visit: Payer: Self-pay | Admitting: Family Medicine

## 2018-05-23 DIAGNOSIS — E2839 Other primary ovarian failure: Secondary | ICD-10-CM

## 2018-05-24 ENCOUNTER — Other Ambulatory Visit: Payer: Self-pay

## 2018-05-24 NOTE — Patient Outreach (Signed)
[facility]) Care Management  05/24/2018  [name] February 26, 1951 [id number]  Referral received from Inpatient Diabetes Coordinator for Diabetes Management and outreach following discharge.   Successful outreach with [name]. She reported feeling better since discharge but grieving the death of her brother. Denied complaints of shortness of breath, chest discomfort or anxiety attacks since discharge. Reported compliance with medications but sporadically monitoring blood glucose due to being busy. Denied s/sx of hypoglycemia or hyperglycemia. Stated she was preparing to meet with family regarding her brother's death but expressed interest in further outreach. Declined need for grief/bereavement counseling referral. Denied urgent care management needs. Contact information provided. Member encouraged to contact RNCM with questions and concerns as needed.   PLAN Will attempt outreach on next week.    [place] [phone]

## 2018-06-01 ENCOUNTER — Other Ambulatory Visit: Payer: Self-pay

## 2018-06-01 DIAGNOSIS — Z794 Long term (current) use of insulin: Secondary | ICD-10-CM | POA: Diagnosis not present

## 2018-06-01 DIAGNOSIS — Z8639 Personal history of other endocrine, nutritional and metabolic disease: Secondary | ICD-10-CM | POA: Diagnosis not present

## 2018-06-01 DIAGNOSIS — E039 Hypothyroidism, unspecified: Secondary | ICD-10-CM | POA: Diagnosis not present

## 2018-06-01 DIAGNOSIS — E1065 Type 1 diabetes mellitus with hyperglycemia: Secondary | ICD-10-CM | POA: Diagnosis not present

## 2018-06-01 NOTE — Patient Outreach (Signed)
[facility]) Care Management  06/01/2018  [name] 09/27/1950 [id number]   Follow up call placed to [name] for completion on telephonic assessment. Call answered by her spouse who stated she was not home at the time. Left message and contact number requesting a return call. Per [name] she will follow up when she returns.   PLAN Will follow up to complete initial assessment within one week.   [place] [phone]

## 2018-06-05 ENCOUNTER — Other Ambulatory Visit: Payer: Self-pay

## 2018-06-05 NOTE — Patient Outreach (Signed)
[facility]) Care Management  06/05/2018   [name] 1951-03-29 [id number]   Subjective:  Telephonic assessment complete. [name] reported doing well but experiencing grief due to the recent loss of her brother.  Objective:  Current Medications:  Current Outpatient Medications  Medication Sig Dispense Refill  . acetaminophen (TYLENOL) 500 MG tablet Take 1,000 mg by mouth every 8 (eight) hours as needed for mild pain or headache.    Marland Kitchen amLODipine (NORVASC) 5 MG tablet Take 2.5 mg by mouth daily.     Marland Kitchen aspirin EC 81 MG tablet Take 81 mg by mouth daily.    . clobetasol (TEMOVATE) 0.05 % external solution Apply 1 application topically See admin instructions. Apply twice daily to scalp and ear as needed for itching/rash (do not apply to face/groin/underarms)  3  . Cyanocobalamin (B-12) 2500 MCG TABS Take 2,500 mcg by mouth daily.    . cyclobenzaprine (FLEXERIL) 10 MG tablet Take 1 tablet (10 mg total) by mouth 3 (three) times daily as needed for muscle spasms. 30 tablet 0  . DEXILANT 60 MG capsule Take 60 mg by mouth daily.     Marland Kitchen docusate sodium (COLACE) 100 MG capsule Take 1 capsule (100 mg total) by mouth 2 (two) times daily. 60 capsule 0  . DULoxetine (CYMBALTA) 60 MG capsule Take 120 mg by mouth daily.     . ferrous sulfate 325 (65 FE) MG tablet Take 1 tablet (325 mg total) by mouth 2 (two) times daily with a meal. 60 tablet 3  . fluticasone (FLONASE) 50 MCG/ACT nasal spray Place 2 sprays into both nostrils daily as needed for allergies or rhinitis.    . furosemide (LASIX) 20 MG tablet Take 20 mg by mouth daily as needed for fluid or edema.     . gabapentin (NEURONTIN) 300 MG capsule Take 300 mg by mouth at bedtime. *May take 600mg  at bedtime if needed  1  . insulin aspart protamine- aspart (NOVOLOG MIX 70/30) (70-30) 100 UNIT/ML injection Inject 0.38 mLs (38 Units total) into the skin 2 (two) times daily with a meal. 10 mL 11  . levothyroxine (SYNTHROID,  LEVOTHROID) 150 MCG tablet Take 150 mcg by mouth daily before breakfast.  5  . LORazepam (ATIVAN) 0.5 MG tablet Take 1 tablet (0.5 mg total) by mouth every 8 (eight) hours as needed for anxiety. 30 tablet 0  . oxyCODONE (OXY IR/ROXICODONE) 5 MG immediate release tablet Take 1 tablet (5 mg total) by mouth every 4 (four) hours as needed for moderate pain ((score 4 to 6)). 30 tablet 0  . Pitavastatin Calcium (LIVALO) 2 MG TABS Take 2 mg by mouth every Wednesday.    . Probiotic Product (PROBIOTIC PO) Take 1 capsule by mouth daily.    . promethazine (PHENERGAN) 25 MG tablet Take 25 mg by mouth every 8 (eight) hours as needed for nausea or vomiting.    . rivaroxaban (XARELTO) 20 MG TABS tablet Take 20 mg by mouth daily.    . temazepam (RESTORIL) 15 MG capsule Take 15 mg by mouth at bedtime.   4  . valsartan (DIOVAN) 40 MG tablet Take 40 mg by mouth every evening.  5   No current facility-administered medications for this visit.     Functional Status:  In your present state of health, do you have any difficulty performing the following activities: 06/05/2018 05/16/2018  Hearing? N N  Vision? N N  Difficulty concentrating or making decisions? N N  Walking or climbing  stairs? N N  Dressing or bathing? N N  Doing errands, shopping? N N  Preparing Food and eating ? N -  Using the Toilet? N -  In the past six months, have you accidently leaked urine? N -  Do you have problems with loss of bowel control? N -  Managing your Medications? N -  Managing your Finances? N -  Housekeeping or managing your Housekeeping? N -  Some recent data might be hidden    Fall/Depression Screening: Fall Risk  06/05/2018  Falls in the past year? No   PHQ 2/9 Scores 06/05/2018  PHQ - 2 Score 2  PHQ- 9 Score 3    Assessment:  Telephonic assessment complete. [name] reported doing well. She has completed her post discharge appointments with her PCP and Endocrinologist. Reported taking medications as  prescribed, attempting to adhere to a diabetic diet and attempting to monitor blood glucose at recommended intervals. She stated that her blood sugar levels have fluctuated since her recent hospitalization due to stress and grief. Reported a fasting blood sugar level of 169 today.   [name] reported grief due to the recent loss of her brother. She reported "thinking about the incident" often but does not want to engage in counseling at this time. She stated that her husband and family are available to provide emotional support. Also stated that discussions with her PCP were very beneficial. She agreed to contact PCP or RNCM if a referral is needed.  Discussed available [facility] services. Mrs. Feinberg declined need for [facility] or [facility] but agreed to notify [facility] if her needs change.    THN CM Care Plan Problem One     Most Recent Value  Care Plan Problem One  Risk for Readmission  Role Documenting the Problem One  Care Management [place] for Problem One  Active  THN Long Term Goal   Over the next 45 days patient will not be hospitalized for complications related to diabetes.  THN Long Term Goal Start Date  06/05/18  Interventions for Problem One Long Term Goal  Discussed medications, nutrition and daily glucose monitoring. Reviewed s/sx of hypoglycemia and hyperglycemia.   THN CM Short Term Goal #1   Over the next 30 days patient will take medications as prescribed.  THN CM Short Term Goal #1 Start Date  06/05/18  Interventions for Short Term Goal #1  Reviewed medications and discussed importance of taking all medications as prescribed.   THN CM Short Term Goal #2   Over the next 30 days patient will monitor blood glucose and maintain daily log.  THN CM Short Term Goal #2 Start Date  06/05/18  Interventions for Short Term Goal #2  Discussed importance of maintaining blood glucose log. Reviewed s/sx of hypoglycemia and hyperglycemia.   THN CM Short Term Goal #3  Over the next 30  days patient will attend all scheduled MD appointments.  THN CM Short Term Goal #3 Start Date  06/05/18  Interventions for Short [place] Goal #3  Reviewed pending appointments. Patient encouraged to attend all scheduled follow ups.  [Offered referral for grief counseling. Patient declined.]       PLAN Will follow up on next week.   [place] [phone]

## 2018-06-11 ENCOUNTER — Other Ambulatory Visit: Payer: Self-pay

## 2018-06-11 NOTE — Patient Outreach (Signed)
[facility]) Care Management  06/11/2018  [name] [birth year] [id number]   Successful outreach with [name]. She reported feeling well. No ED visits since previous outreach. Reported compliance with blood glucose monitoring but encouraged to monitor consistently and recheck readings when results are out of range. Reported fasting blood glucose reading of 169. She reported coughing with minimum sputum production over the past week. No shortness of breath or chest discomfort. Reported occasional episodes of excessive sweating at night and stated that it was discussed with her primary care provider. [name] denied urgent needs or concerns and agreed to contact RNCM if needed before next scheduled outreach.   PLAN Will follow up within two weeks.   [place] [phone]

## 2018-06-12 DIAGNOSIS — F4321 Adjustment disorder with depressed mood: Secondary | ICD-10-CM | POA: Diagnosis not present

## 2018-06-25 ENCOUNTER — Other Ambulatory Visit: Payer: Self-pay

## 2018-06-25 NOTE — Patient Outreach (Signed)
[facility]) Care Management  06/25/2018  [name] 02/21/1951 [id number]   Successful outreach with [name]. Reported a headache and joint pain this morning but stated she "felt much better" after resting and taking prescribed pain medications. She could not recall her blood glucose readings but reported compliance with prescribed medications. She verbalized knowledge of s/sx of hypoglycemia and hyperglycemia. [name] denied urgent concerns and agreed to contact PCP if the pain returns and an acute visit is needed.   PLAN Will continue routine outreach.   [place] [phone]

## 2018-06-27 ENCOUNTER — Other Ambulatory Visit: Payer: Medicare Other

## 2018-07-02 DIAGNOSIS — J069 Acute upper respiratory infection, unspecified: Secondary | ICD-10-CM | POA: Diagnosis not present

## 2018-07-02 DIAGNOSIS — J0140 Acute pansinusitis, unspecified: Secondary | ICD-10-CM | POA: Diagnosis not present

## 2018-07-18 ENCOUNTER — Other Ambulatory Visit: Payer: Self-pay

## 2018-07-18 ENCOUNTER — Ambulatory Visit: Payer: Self-pay

## 2018-07-18 NOTE — Patient Outreach (Signed)
[facility]) Care Management  07/18/2018  [name] 1951/02/26 [id number]    Successful outreach with [name]. Since the last outreach she was evaluated in the urgent care/walk-in clinic for severe cold/flu-like symptoms.  Denied complaints of shortness of breath, chest pain, severe headaches or dizziness. Still experiencing an occasional non-productive cough but reported feeling well and "much better" today. Reported compliance with medications and daily fasting blood glucose monitoring. Her range since last outreach was 43-200. [name] reported the wide range was due to either eating a high carb evening meal and snacks during the night or skipping the evening snack. Discussed nutritional habits and [name] agreed that attending a Diabetes Education course would be beneficial. She was agreeable to possibly starting the course within the next few months but requested that RNCM follow up prior to placing referral.   [facility] CM Care Plan Problem One     Most Recent Value  Care Plan Problem One  Risk for Readmission  Role Documenting the Problem One  Care Management [place] for Problem One  Active  THN Long Term Goal   Over the next 45 days patient will not be hospitalized for complications related to diabetes.  THN Long Term Goal Start Date  06/05/18  Interventions for Problem One Long Term Goal  Discussed blood glucose ranges. Reviewed s/sx of hypoglycemia and hyperglycemia. Discussed patient dietary intake, medications and nutrition options.    THN CM Short Term Goal #1   Over the next 30 days patient will take medications as prescribed.  THN CM Short Term Goal #1 Start Date  06/05/18  THN CM Short Term Goal #1 Met Date  07/18/18  THN CM Short Term Goal #2   Over the next 30 days patient will monitor blood glucose and maintain daily log.  THN CM Short Term Goal #2 Start Date  06/05/18  Interventions for Short Term Goal #2  Discussed blood glucose  ranges. Patient encouraged to continue monitoring fasting blood sugar levels and maintain a daily log. Provided education regarding importance of consistently checking levels prior to taking evening medication. Discussed s/sx of hypoglycemia and hyperglycemia.   THN CM Short Term Goal #3  Over the next 30 days patient will attend all scheduled MD appointments.  THN CM Short Term Goal #3 Start Date  06/05/18  [facility] CM Short Term Goal #3 Met Date  07/18/18     PLAN Will follow up within 3 weeks.  [place] [phone]

## 2018-08-07 ENCOUNTER — Other Ambulatory Visit: Payer: Self-pay

## 2018-08-07 NOTE — Patient Outreach (Signed)
[facility]) Care Management  08/07/2018  [name] 10-28-1950 [id number]   Follow-up outreach with [name]. She reported doing well today but opted not to attend diabetes education sessions. Reported compliance with medications and daily blood sugar monitoring. Reported last fasting blood sugar reading of 196. Discussed possible transfer to health coach on next month. [name] denied urgent concerns and was agreeable to follow up after evaluation with [name].   PLAN Will follow up on next month.  [place] [phone]

## 2018-08-17 DIAGNOSIS — J019 Acute sinusitis, unspecified: Secondary | ICD-10-CM | POA: Diagnosis not present

## 2018-08-28 DIAGNOSIS — Z6837 Body mass index (BMI) 37.0-37.9, adult: Secondary | ICD-10-CM | POA: Diagnosis not present

## 2018-08-28 DIAGNOSIS — M19011 Primary osteoarthritis, right shoulder: Secondary | ICD-10-CM | POA: Diagnosis not present

## 2018-08-28 DIAGNOSIS — M25511 Pain in right shoulder: Secondary | ICD-10-CM | POA: Diagnosis not present

## 2018-08-28 DIAGNOSIS — G8929 Other chronic pain: Secondary | ICD-10-CM | POA: Diagnosis not present

## 2018-08-28 DIAGNOSIS — Z9889 Other specified postprocedural states: Secondary | ICD-10-CM | POA: Diagnosis not present

## 2018-08-28 DIAGNOSIS — M48062 Spinal stenosis, lumbar region with neurogenic claudication: Secondary | ICD-10-CM | POA: Diagnosis not present

## 2018-08-30 ENCOUNTER — Other Ambulatory Visit: Payer: Self-pay | Admitting: Orthopedic Surgery

## 2018-08-30 DIAGNOSIS — R531 Weakness: Secondary | ICD-10-CM

## 2018-08-30 DIAGNOSIS — M25511 Pain in right shoulder: Secondary | ICD-10-CM

## 2018-08-31 ENCOUNTER — Other Ambulatory Visit: Payer: Self-pay

## 2018-08-31 NOTE — Patient Outreach (Signed)
[facility]) Care Management  08/31/2018  [name] Jul 25, 1951 [id number]   Successful outreach with [name] to discuss care management needs and readiness for case closure.  [name] reported feeling well and denied worsening symptoms or complications related to diabetes. Reported pending follow up related to torn rotator cuff in her right shoulder. Stated the shoulder was injured in the past and reported being familiar with treatment plan.  Denied changes in ability to perform ADLs and reported spouse was available in the home to assist as needed. Reported continued compliance with medications and blood glucose monitoring. Reported morning reading was 81. She denied changes in care management needs but preferred not to participate in further diabetes outreach or health coaching. Member agreed to notify PCP if health status changes and additional care management services are needed.   PLAN Will complete case closure.   [place] [phone]

## 2018-09-10 ENCOUNTER — Other Ambulatory Visit: Payer: Self-pay | Admitting: Family Medicine

## 2018-09-10 DIAGNOSIS — Z1231 Encounter for screening mammogram for malignant neoplasm of breast: Secondary | ICD-10-CM

## 2018-09-11 ENCOUNTER — Ambulatory Visit
Admission: RE | Admit: 2018-09-11 | Discharge: 2018-09-11 | Disposition: A | Discharge status: Home / Self Care | Payer: Medicare Other | Source: Ambulatory Visit | Attending: Orthopedic Surgery | Admitting: Orthopedic Surgery

## 2018-09-11 DIAGNOSIS — E039 Hypothyroidism, unspecified: Secondary | ICD-10-CM | POA: Diagnosis not present

## 2018-09-11 DIAGNOSIS — Z794 Long term (current) use of insulin: Secondary | ICD-10-CM | POA: Diagnosis not present

## 2018-09-11 DIAGNOSIS — M25511 Pain in right shoulder: Secondary | ICD-10-CM

## 2018-09-11 DIAGNOSIS — E1065 Type 1 diabetes mellitus with hyperglycemia: Secondary | ICD-10-CM | POA: Diagnosis not present

## 2018-09-11 DIAGNOSIS — R531 Weakness: Secondary | ICD-10-CM

## 2018-09-11 DIAGNOSIS — Z8639 Personal history of other endocrine, nutritional and metabolic disease: Secondary | ICD-10-CM | POA: Diagnosis not present

## 2018-09-13 DIAGNOSIS — Z6837 Body mass index (BMI) 37.0-37.9, adult: Secondary | ICD-10-CM | POA: Diagnosis not present

## 2018-09-13 DIAGNOSIS — Z794 Long term (current) use of insulin: Secondary | ICD-10-CM | POA: Diagnosis not present

## 2018-09-13 DIAGNOSIS — E1169 Type 2 diabetes mellitus with other specified complication: Secondary | ICD-10-CM | POA: Diagnosis not present

## 2018-09-13 DIAGNOSIS — M7551 Bursitis of right shoulder: Secondary | ICD-10-CM | POA: Diagnosis not present

## 2018-09-13 DIAGNOSIS — Z86010 Personal history of colonic polyps: Secondary | ICD-10-CM | POA: Diagnosis not present

## 2018-09-13 DIAGNOSIS — K219 Gastro-esophageal reflux disease without esophagitis: Secondary | ICD-10-CM | POA: Diagnosis not present

## 2018-09-13 DIAGNOSIS — K660 Peritoneal adhesions (postprocedural) (postinfection): Secondary | ICD-10-CM | POA: Diagnosis not present

## 2018-09-13 DIAGNOSIS — R1084 Generalized abdominal pain: Secondary | ICD-10-CM | POA: Diagnosis not present

## 2018-09-13 DIAGNOSIS — Z8639 Personal history of other endocrine, nutritional and metabolic disease: Secondary | ICD-10-CM | POA: Diagnosis not present

## 2018-09-13 DIAGNOSIS — M797 Fibromyalgia: Secondary | ICD-10-CM | POA: Diagnosis not present

## 2018-09-24 DIAGNOSIS — M25511 Pain in right shoulder: Secondary | ICD-10-CM | POA: Diagnosis not present

## 2018-09-24 DIAGNOSIS — R0981 Nasal congestion: Secondary | ICD-10-CM | POA: Diagnosis not present

## 2018-09-24 DIAGNOSIS — E785 Hyperlipidemia, unspecified: Secondary | ICD-10-CM | POA: Diagnosis not present

## 2018-09-24 DIAGNOSIS — M791 Myalgia, unspecified site: Secondary | ICD-10-CM | POA: Diagnosis not present

## 2018-09-27 ENCOUNTER — Other Ambulatory Visit: Payer: Self-pay | Admitting: Family Medicine

## 2018-09-27 DIAGNOSIS — R0981 Nasal congestion: Secondary | ICD-10-CM

## 2018-10-11 ENCOUNTER — Ambulatory Visit
Admission: RE | Admit: 2018-10-11 | Discharge: 2018-10-11 | Disposition: A | Discharge status: Home / Self Care | Payer: Medicare Other | Source: Ambulatory Visit | Attending: Family Medicine | Admitting: Family Medicine

## 2018-10-11 DIAGNOSIS — R0981 Nasal congestion: Secondary | ICD-10-CM

## 2018-10-11 DIAGNOSIS — R51 Headache: Secondary | ICD-10-CM | POA: Diagnosis not present

## 2018-10-17 ENCOUNTER — Ambulatory Visit
Admission: RE | Admit: 2018-10-17 | Discharge: 2018-10-17 | Disposition: A | Discharge status: Home / Self Care | Payer: Medicare Other | Source: Ambulatory Visit | Attending: Family Medicine | Admitting: Family Medicine

## 2018-10-17 ENCOUNTER — Other Ambulatory Visit: Payer: Self-pay

## 2018-10-17 DIAGNOSIS — Z1231 Encounter for screening mammogram for malignant neoplasm of breast: Secondary | ICD-10-CM | POA: Diagnosis not present

## 2018-10-17 DIAGNOSIS — Z780 Asymptomatic menopausal state: Secondary | ICD-10-CM | POA: Diagnosis not present

## 2018-10-17 DIAGNOSIS — M810 Age-related osteoporosis without current pathological fracture: Secondary | ICD-10-CM | POA: Diagnosis not present

## 2018-10-17 DIAGNOSIS — E2839 Other primary ovarian failure: Secondary | ICD-10-CM

## 2018-11-09 DIAGNOSIS — J320 Chronic maxillary sinusitis: Secondary | ICD-10-CM | POA: Diagnosis not present

## 2018-11-21 ENCOUNTER — Other Ambulatory Visit: Payer: Self-pay | Admitting: *Deleted

## 2018-11-21 NOTE — Patient Outreach (Signed)
[name] [facility]) Care Management  11/21/2018  GILLIE FLEITES August 03, 1951 [id number]   CSW was able to make initial contact with patient today to perform phone assessment, as well as assess and assist with social work needs and services.  CSW introduced self, explained role and types of services provided through [place] [facility] ([place] [facility]).  CSW further explained to patient that CSW works with patient's Primary Care Physician, also with [place] [facility], Harlan Stains. CSW then explained the reason for the call, indicating that Dr. Dema Severin thought that patient would benefit from social work services and resources to assist with counseling and supportive services for symptoms of depression.  CSW obtained two HIPAA compliant identifiers from patient, which included patient's name and date of birth.  Patient stated, "I don't believe I need any help at this time, I am doing much better".  CSW explained to patient that CSW is able to offer telephonic counseling and supportive services to patient until Coronavirus restrictions are lifted, then provide short-term in-home counseling services to patient, at least until CSW is able to get patient established with a counselor/therapist in the community.  Again, patient denied the need for services, not even wanting CSW to send her EMMI information pertaining specifically to "Signs and Symptoms of Depression", or mail her a list of counselors/therapists in the community that accept Medicare.  Patient reported being independent with activities of daily living, the ability to afford medications and the ability to transport herself to and from all her physician appointments.  CSW will perform a case closure on patient, as patient denied the need for social work services at this time.    Patient did not wish to be contacted by a nurse with [facility], telephonic or otherwise, indicating that  Neldon Labella was recently involved with her care in late January of 2020.  CSW will fax an update to patient's Primary Care Physician, Dr. Harlan Stains to ensure that they are aware of CSW's involvement with patient's plan of care.  CSW was able to confirm that patient has the correct contact information for CSW, encouraging patient to contact CSW directly if she changes her mind, or if additional social work needs arise in the near future.  Patient voiced understanding and was certainly agreeable to this plan.  Patient was also most appreciative of the call.  Nat Christen, BSW, MSW, LCSW  Licensed Education officer, [facility]  Mailing [place] [address], [place], [place] [address] Physical Address-[address], [place],  [address] Toll Free Main # [phone] Fax # [phone] Cell # [phone]  Office # [phone] [email or web address]

## 2018-12-15 ENCOUNTER — Encounter (HOSPITAL_COMMUNITY): Payer: Self-pay | Admitting: Emergency Medicine

## 2018-12-15 ENCOUNTER — Inpatient Hospital Stay (HOSPITAL_COMMUNITY): Payer: Medicare Other

## 2018-12-15 ENCOUNTER — Emergency Department (HOSPITAL_COMMUNITY): Payer: Medicare Other

## 2018-12-15 ENCOUNTER — Other Ambulatory Visit: Payer: Self-pay

## 2018-12-15 ENCOUNTER — Inpatient Hospital Stay (HOSPITAL_COMMUNITY)
Admission: EM | Admit: 2018-12-15 | Discharge: 2018-12-22 | DRG: 389 | Disposition: A | Discharge status: Home / Self Care | Payer: Medicare Other | Attending: Internal Medicine | Admitting: Internal Medicine

## 2018-12-15 DIAGNOSIS — Z87442 Personal history of urinary calculi: Secondary | ICD-10-CM | POA: Diagnosis not present

## 2018-12-15 DIAGNOSIS — E039 Hypothyroidism, unspecified: Secondary | ICD-10-CM | POA: Diagnosis present

## 2018-12-15 DIAGNOSIS — E1100 Type 2 diabetes mellitus with hyperosmolarity without nonketotic hyperglycemic-hyperosmolar coma (NKHHC): Secondary | ICD-10-CM | POA: Diagnosis not present

## 2018-12-15 DIAGNOSIS — Z791 Long term (current) use of non-steroidal anti-inflammatories (NSAID): Secondary | ICD-10-CM | POA: Diagnosis not present

## 2018-12-15 DIAGNOSIS — K219 Gastro-esophageal reflux disease without esophagitis: Secondary | ICD-10-CM | POA: Diagnosis present

## 2018-12-15 DIAGNOSIS — Z79899 Other long term (current) drug therapy: Secondary | ICD-10-CM

## 2018-12-15 DIAGNOSIS — M797 Fibromyalgia: Secondary | ICD-10-CM | POA: Diagnosis present

## 2018-12-15 DIAGNOSIS — E785 Hyperlipidemia, unspecified: Secondary | ICD-10-CM | POA: Diagnosis present

## 2018-12-15 DIAGNOSIS — Z885 Allergy status to narcotic agent status: Secondary | ICD-10-CM | POA: Diagnosis not present

## 2018-12-15 DIAGNOSIS — R000 Tachycardia, unspecified: Secondary | ICD-10-CM | POA: Diagnosis not present

## 2018-12-15 DIAGNOSIS — Z803 Family history of malignant neoplasm of breast: Secondary | ICD-10-CM

## 2018-12-15 DIAGNOSIS — G2581 Restless legs syndrome: Secondary | ICD-10-CM | POA: Diagnosis present

## 2018-12-15 DIAGNOSIS — Z0189 Encounter for other specified special examinations: Secondary | ICD-10-CM

## 2018-12-15 DIAGNOSIS — K6389 Other specified diseases of intestine: Secondary | ICD-10-CM | POA: Diagnosis not present

## 2018-12-15 DIAGNOSIS — Z4682 Encounter for fitting and adjustment of non-vascular catheter: Secondary | ICD-10-CM | POA: Diagnosis not present

## 2018-12-15 DIAGNOSIS — Z86718 Personal history of other venous thrombosis and embolism: Secondary | ICD-10-CM | POA: Diagnosis not present

## 2018-12-15 DIAGNOSIS — K56609 Unspecified intestinal obstruction, unspecified as to partial versus complete obstruction: Secondary | ICD-10-CM

## 2018-12-15 DIAGNOSIS — Z8673 Personal history of transient ischemic attack (TIA), and cerebral infarction without residual deficits: Secondary | ICD-10-CM

## 2018-12-15 DIAGNOSIS — R0789 Other chest pain: Secondary | ICD-10-CM | POA: Diagnosis not present

## 2018-12-15 DIAGNOSIS — Z03818 Encounter for observation for suspected exposure to other biological agents ruled out: Secondary | ICD-10-CM | POA: Diagnosis not present

## 2018-12-15 DIAGNOSIS — Z7982 Long term (current) use of aspirin: Secondary | ICD-10-CM

## 2018-12-15 DIAGNOSIS — E860 Dehydration: Secondary | ICD-10-CM

## 2018-12-15 DIAGNOSIS — E119 Type 2 diabetes mellitus without complications: Secondary | ICD-10-CM

## 2018-12-15 DIAGNOSIS — Z880 Allergy status to penicillin: Secondary | ICD-10-CM | POA: Diagnosis not present

## 2018-12-15 DIAGNOSIS — E1165 Type 2 diabetes mellitus with hyperglycemia: Secondary | ICD-10-CM | POA: Diagnosis not present

## 2018-12-15 DIAGNOSIS — K5651 Intestinal adhesions [bands], with partial obstruction: Secondary | ICD-10-CM | POA: Diagnosis not present

## 2018-12-15 DIAGNOSIS — Z7901 Long term (current) use of anticoagulants: Secondary | ICD-10-CM

## 2018-12-15 DIAGNOSIS — Z794 Long term (current) use of insulin: Secondary | ICD-10-CM | POA: Diagnosis not present

## 2018-12-15 DIAGNOSIS — Z96653 Presence of artificial knee joint, bilateral: Secondary | ICD-10-CM | POA: Diagnosis present

## 2018-12-15 DIAGNOSIS — Z9851 Tubal ligation status: Secondary | ICD-10-CM

## 2018-12-15 DIAGNOSIS — N179 Acute kidney failure, unspecified: Secondary | ICD-10-CM | POA: Diagnosis present

## 2018-12-15 DIAGNOSIS — I5189 Other ill-defined heart diseases: Secondary | ICD-10-CM

## 2018-12-15 DIAGNOSIS — Z9049 Acquired absence of other specified parts of digestive tract: Secondary | ICD-10-CM | POA: Diagnosis not present

## 2018-12-15 DIAGNOSIS — Z79891 Long term (current) use of opiate analgesic: Secondary | ICD-10-CM

## 2018-12-15 DIAGNOSIS — Z981 Arthrodesis status: Secondary | ICD-10-CM | POA: Diagnosis not present

## 2018-12-15 DIAGNOSIS — Z8249 Family history of ischemic heart disease and other diseases of the circulatory system: Secondary | ICD-10-CM

## 2018-12-15 DIAGNOSIS — K5669 Other partial intestinal obstruction: Secondary | ICD-10-CM | POA: Diagnosis not present

## 2018-12-15 DIAGNOSIS — K56600 Partial intestinal obstruction, unspecified as to cause: Secondary | ICD-10-CM | POA: Diagnosis present

## 2018-12-15 DIAGNOSIS — R109 Unspecified abdominal pain: Secondary | ICD-10-CM | POA: Diagnosis not present

## 2018-12-15 DIAGNOSIS — I10 Essential (primary) hypertension: Secondary | ICD-10-CM | POA: Diagnosis present

## 2018-12-15 DIAGNOSIS — Z888 Allergy status to other drugs, medicaments and biological substances status: Secondary | ICD-10-CM

## 2018-12-15 DIAGNOSIS — K598 Other specified functional intestinal disorders: Secondary | ICD-10-CM | POA: Diagnosis not present

## 2018-12-15 DIAGNOSIS — Z833 Family history of diabetes mellitus: Secondary | ICD-10-CM

## 2018-12-15 DIAGNOSIS — Z79890 Hormone replacement therapy (postmenopausal): Secondary | ICD-10-CM | POA: Diagnosis not present

## 2018-12-15 DIAGNOSIS — Z1159 Encounter for screening for other viral diseases: Secondary | ICD-10-CM | POA: Diagnosis not present

## 2018-12-15 DIAGNOSIS — R739 Hyperglycemia, unspecified: Secondary | ICD-10-CM

## 2018-12-15 DIAGNOSIS — Z6837 Body mass index (BMI) 37.0-37.9, adult: Secondary | ICD-10-CM

## 2018-12-15 LAB — URINALYSIS, ROUTINE W REFLEX MICROSCOPIC
Bacteria, UA: NONE SEEN
Bilirubin Urine: NEGATIVE
Glucose, UA: >=500 mg/dL — AB
Ketones, ur: 20 mg/dL — AB
Leukocytes,Ua: NEGATIVE
Nitrite: NEGATIVE
Protein, ur: NEGATIVE mg/dL
Specific Gravity, Urine: 1.04 — ABNORMAL HIGH (ref 1.005–1.030)
pH: 5 (ref 5.0–8.0)

## 2018-12-15 LAB — CBC WITH DIFFERENTIAL/PLATELET
Abs Immature Granulocytes: 0.04 10*3/uL (ref 0.00–0.07)
Basophils Absolute: 0.1 10*3/uL (ref 0.0–0.1)
Basophils Relative: 0 %
Eosinophils Absolute: 0 10*3/uL (ref 0.0–0.5)
Eosinophils Relative: 0 %
HCT: 48.2 % — ABNORMAL HIGH (ref 36.0–46.0)
Hemoglobin: 16.2 g/dL — ABNORMAL HIGH (ref 12.0–15.0)
Immature Granulocytes: 0 %
Lymphocytes Relative: 11 %
Lymphs Abs: 1.5 10*3/uL (ref 0.7–4.0)
MCH: 27.5 pg (ref 26.0–34.0)
MCHC: 33.6 g/dL (ref 30.0–36.0)
MCV: 81.7 fL (ref 80.0–100.0)
Monocytes Absolute: 0.4 10*3/uL (ref 0.1–1.0)
Monocytes Relative: 3 %
Neutro Abs: 11.3 10*3/uL — ABNORMAL HIGH (ref 1.7–7.7)
Neutrophils Relative %: 86 %
Platelets: 346 10*3/uL (ref 150–400)
RBC: 5.9 MIL/uL — ABNORMAL HIGH (ref 3.87–5.11)
RDW: 12.6 % (ref 11.5–15.5)
WBC: 13.3 10*3/uL — ABNORMAL HIGH (ref 4.0–10.5)
nRBC: 0 % (ref 0.0–0.2)

## 2018-12-15 LAB — COMPREHENSIVE METABOLIC PANEL
ALT: 14 U/L (ref 0–44)
AST: 15 U/L (ref 15–41)
Albumin: 3.9 g/dL (ref 3.5–5.0)
Alkaline Phosphatase: 122 U/L (ref 38–126)
Anion gap: 15 (ref 5–15)
BUN: 13 mg/dL (ref 8–23)
CO2: 19 mmol/L — ABNORMAL LOW (ref 22–32)
Calcium: 9.5 mg/dL (ref 8.9–10.3)
Chloride: 100 mmol/L (ref 98–111)
Creatinine, Ser: 1.09 mg/dL — ABNORMAL HIGH (ref 0.44–1.00)
GFR calc Af Amer: >60 mL/min (ref >60)
GFR calc non Af Amer: 52 mL/min — ABNORMAL LOW (ref >60)
Glucose, Bld: 429 mg/dL — ABNORMAL HIGH (ref 70–99)
Potassium: 4.4 mmol/L (ref 3.5–5.1)
Sodium: 134 mmol/L — ABNORMAL LOW (ref 135–145)
Total Bilirubin: 1.1 mg/dL (ref 0.3–1.2)
Total Protein: 7.1 g/dL (ref 6.5–8.1)

## 2018-12-15 LAB — TROPONIN I: Troponin I: <0.03 ng/mL (ref <0.03)

## 2018-12-15 LAB — LIPASE, BLOOD: Lipase: 22 U/L (ref 11–51)

## 2018-12-15 LAB — GLUCOSE, CAPILLARY
Glucose-Capillary: 244 mg/dL — ABNORMAL HIGH (ref 70–99)
Glucose-Capillary: 204 mg/dL — ABNORMAL HIGH (ref 70–99)

## 2018-12-15 LAB — SARS CORONAVIRUS 2 BY RT PCR (HOSPITAL ORDER, PERFORMED IN ~~LOC~~ HOSPITAL LAB): SARS Coronavirus 2: NEGATIVE

## 2018-12-15 LAB — CBG MONITORING, ED: Glucose-Capillary: 403 mg/dL — ABNORMAL HIGH (ref 70–99)

## 2018-12-15 MED ORDER — INSULIN GLARGINE 100 UNIT/ML ~~LOC~~ SOLN
20.0000 [IU] | Freq: Every day | SUBCUTANEOUS | Status: DC
  Administered 2018-12-15 – 2018-12-17 (×3): 20 [IU] via SUBCUTANEOUS
  Administered 2018-12-18: 10 [IU] via SUBCUTANEOUS
  Administered 2018-12-20 – 2018-12-21 (×2): 20 [IU] via SUBCUTANEOUS
  Filled 2018-12-15 (×8): qty 0.2

## 2018-12-15 MED ORDER — MORPHINE SULFATE (PF) 2 MG/ML IV SOLN
2.0000 mg | INTRAVENOUS | Status: DC | PRN
  Administered 2018-12-15 – 2018-12-17 (×7): 2 mg via INTRAVENOUS
  Filled 2018-12-15 (×7): qty 1

## 2018-12-15 MED ORDER — ONDANSETRON HCL 4 MG PO TABS: 4.0000 mg | ORAL_TABLET | Freq: Four times a day (QID) | ORAL | Status: DC | PRN

## 2018-12-15 MED ORDER — FENTANYL CITRATE (PF) 100 MCG/2ML IJ SOLN
50.0000 ug | Freq: Once | INTRAMUSCULAR | Status: AC
  Administered 2018-12-15: 50 ug via INTRAVENOUS
  Filled 2018-12-15: qty 2

## 2018-12-15 MED ORDER — DIPHENHYDRAMINE HCL 50 MG/ML IJ SOLN
12.5000 mg | Freq: Four times a day (QID) | INTRAMUSCULAR | Status: DC | PRN
  Administered 2018-12-15 – 2018-12-16 (×2): 12.5 mg via INTRAVENOUS
  Filled 2018-12-15 (×2): qty 1

## 2018-12-15 MED ORDER — ONDANSETRON HCL 4 MG/2ML IJ SOLN
4.0000 mg | Freq: Four times a day (QID) | INTRAMUSCULAR | Status: DC | PRN
  Administered 2018-12-16 – 2018-12-20 (×9): 4 mg via INTRAVENOUS
  Filled 2018-12-15 (×9): qty 2

## 2018-12-15 MED ORDER — ONDANSETRON HCL 4 MG/2ML IJ SOLN
4.0000 mg | Freq: Once | INTRAMUSCULAR | Status: AC
  Administered 2018-12-15: 4 mg via INTRAVENOUS
  Filled 2018-12-15: qty 2

## 2018-12-15 MED ORDER — SODIUM CHLORIDE 0.9 % IV BOLUS
1000.0000 mL | Freq: Once | INTRAVENOUS | Status: AC
  Administered 2018-12-15: 1000 mL via INTRAVENOUS

## 2018-12-15 MED ORDER — DIPHENHYDRAMINE HCL 50 MG/ML IJ SOLN: 12.5000 mg | Freq: Once | INTRAMUSCULAR | Status: DC

## 2018-12-15 MED ORDER — SODIUM CHLORIDE 0.9 % IV SOLN
INTRAVENOUS | Status: DC
  Administered 2018-12-15 – 2018-12-21 (×13): via INTRAVENOUS

## 2018-12-15 MED ORDER — ENOXAPARIN SODIUM 100 MG/ML ~~LOC~~ SOLN
1.0000 mg/kg | Freq: Two times a day (BID) | SUBCUTANEOUS | Status: DC
  Administered 2018-12-15 – 2018-12-21 (×12): 90 mg via SUBCUTANEOUS
  Filled 2018-12-15 (×12): qty 1

## 2018-12-15 MED ORDER — LORAZEPAM 2 MG/ML IJ SOLN: 0.5000 mg | Freq: Four times a day (QID) | INTRAMUSCULAR | Status: DC | PRN

## 2018-12-15 MED ORDER — PROMETHAZINE HCL 25 MG/ML IJ SOLN
12.5000 mg | Freq: Four times a day (QID) | INTRAMUSCULAR | Status: DC | PRN
  Administered 2018-12-16 – 2018-12-19 (×10): 12.5 mg via INTRAVENOUS
  Filled 2018-12-15 (×10): qty 1

## 2018-12-15 MED ORDER — DIATRIZOATE MEGLUMINE & SODIUM 66-10 % PO SOLN
90.0000 mL | Freq: Once | ORAL | Status: AC
  Administered 2018-12-15: 90 mL via NASOGASTRIC
  Filled 2018-12-15: qty 90

## 2018-12-15 MED ORDER — MORPHINE SULFATE (PF) 4 MG/ML IV SOLN
4.0000 mg | Freq: Once | INTRAVENOUS | Status: AC
  Administered 2018-12-15: 4 mg via INTRAVENOUS
  Filled 2018-12-15: qty 1

## 2018-12-15 MED ORDER — PROMETHAZINE HCL 25 MG/ML IJ SOLN: 12.5000 mg | Freq: Four times a day (QID) | INTRAMUSCULAR | Status: DC | PRN

## 2018-12-15 MED ORDER — HYDROCODONE-ACETAMINOPHEN 5-325 MG PO TABS: 1.0000 | ORAL_TABLET | ORAL | Status: DC | PRN

## 2018-12-15 MED ORDER — MORPHINE SULFATE (PF) 4 MG/ML IV SOLN: 4.0000 mg | Freq: Once | INTRAVENOUS | Status: DC

## 2018-12-15 MED ORDER — IOHEXOL 300 MG/ML  SOLN
100.0000 mL | Freq: Once | INTRAMUSCULAR | Status: AC | PRN
  Administered 2018-12-15: 100 mL via INTRAVENOUS

## 2018-12-15 MED ORDER — INSULIN ASPART 100 UNIT/ML ~~LOC~~ SOLN
0.0000 [IU] | SUBCUTANEOUS | Status: DC
  Administered 2018-12-15: 5 [IU] via SUBCUTANEOUS
  Administered 2018-12-16 (×2): 2 [IU] via SUBCUTANEOUS
  Administered 2018-12-16 – 2018-12-17 (×3): 3 [IU] via SUBCUTANEOUS
  Administered 2018-12-17 (×2): 2 [IU] via SUBCUTANEOUS
  Administered 2018-12-17: 3 [IU] via SUBCUTANEOUS
  Administered 2018-12-18: 2 [IU] via SUBCUTANEOUS
  Administered 2018-12-18: 3 [IU] via SUBCUTANEOUS
  Administered 2018-12-18 – 2018-12-19 (×2): 2 [IU] via SUBCUTANEOUS
  Administered 2018-12-20: 3 [IU] via SUBCUTANEOUS
  Administered 2018-12-20 – 2018-12-21 (×2): 2 [IU] via SUBCUTANEOUS
  Administered 2018-12-21: 3 [IU] via SUBCUTANEOUS

## 2018-12-15 NOTE — ED Notes (Signed)
ED Provider at bedside. 

## 2018-12-15 NOTE — H&P (Signed)
.  [facility] History and Physical  [name] YHC:[id number] DOB: 1951/01/11 DOA: 12/15/2018  Referring physician: ED  PCP: Harlan Stains, MD   Chief Complaint: Abdominal pain  HPI: [name] is a 68 y.o. female with past medical history of diabetes mellitus currently on insulin, GERD, hypertension, hyperlipidemia, hypothyroidism, history of stroke, diastolic dysfunction, fibromyalgia and prior history of DVT on Xarelto, multiple abdominal surgeries presented to the hospital with complaints of abdominal pain that started in the evening yesterday.  Patient complained of lower abdominal pain radiating to the upper abdomen intermittent in nature, sharp and moderate to severe in intensity.  It was associated with nausea and multiple episodes of vomiting throughout the night and day.  Patient stated that her last bowel movement was on 12/11/2018 and had tried MiraLAX and Dulcolax without any relief.  She has been passing some gas occasionally.  Complains of generalized weakness and near passing out spells and lightheadedness today.  Patient does use insulin at home and her sugars have been running high.  Patient however denies recent fever, chills or rigor.  Denies any hematemesis or melena.  Patient denies any shortness of breath, cough or fever but has mild chest and back pain.  Patient does have history of multiple abdominal surgeries in the past.  Denies any urinary urgency, frequency or dysuria.  ED Course: In the ED, patient had a CT scan of the abdomen and pelvis which showed surgical changes in the right midabdomen with changes consistent with adhesions and at least a partial distal small bowel obstruction.  Surgery was consulted from the ED and was advised NG tube, IV fluids and conservative management.  Surgery to follow the patient after hospitalization.  Review of Systems:  All systems were reviewed and were negative unless otherwise mentioned in the HPI  Past Medical History:    Diagnosis Date   Anemia    iron deficiency   Arthritis    Blood transfusion    Cervical spondylosis with myelopathy and radiculopathy    Depression    Diabetes mellitus    Type II   Diastolic dysfunction    Diverticulitis    DJD (degenerative joint disease)    DVT (deep venous thrombosis) (HCC)    times 2 lower leg   Endometriosis    Family history of adverse reaction to anesthesia    mom and dad PONV   Fibromyalgia    GERD (gastroesophageal reflux disease)    occ   Hiatal hernia    History of kidney stones    Hyperlipidemia    Hypertension    Hypothyroidism    Insomnia    Mild aortic insufficiency    by echo 04/2013   Osteoarthritis    back and knee   Ovarian cyst    PONV (postoperative nausea and vomiting)    RLS (restless legs syndrome)    Thoracic compression fracture (HCC)    Uterine fibroid    UTI (lower urinary tract infection)    Villous adenoma of right colon 04/08/2016   Vitamin D deficiency disease    Past Surgical History:  Procedure Laterality Date   ANTERIOR CERVICAL DECOMP/DISCECTOMY FUSION N/A 03/23/2017   Procedure: ANTERIOR CERVICAL DECOMPRESSION/DISCECTOMY FUSION, INTERBODY PROSTHESIS,PLATE CERVICAL THREE- CERVICAL FOUR, CERVICAL FOUR- CERVICAL FIVE, CERVICAL FIVE- CERVICAL SIX;  Surgeon: Newman Pies, MD;  Location: [facility];  Service: Neurosurgery;  Laterality: N/A;   APPENDECTOMY     BACK SURGERY  ,2005   April  2013 - spinal fusion[facility]  BREAST SURGERY     breast reduction   CARDIAC CATHETERIZATION  04/2013   normal coronary arteries and normal LVF   CARPAL TUNNEL RELEASE  06/05/2012   Procedure: CARPAL TUNNEL RELEASE;  Surgeon: Cammie Sickle., MD;  Location: [place];  Service: Orthopedics;  Laterality: Left;   CESAREAN SECTION     x 2   CHOLECYSTECTOMY     COLECTOMY  04/08/2016   DILATION AND CURETTAGE OF UTERUS     DORSAL COMPARTMENT RELEASE  06/05/2012   Procedure:  RELEASE DORSAL COMPARTMENT (DEQUERVAIN);  Surgeon: Cammie Sickle., MD;  Location: [facility];  Service: Orthopedics;  Laterality: Left;  Excision of mixoid cyst also   EYE SURGERY     cataracts bilateral   JOINT REPLACEMENT     Bilateral knee   KNEE ARTHROPLASTY  09   lft partial   KNEE ARTHROPLASTY     rt   LAPAROSCOPIC PARTIAL COLECTOMY N/A 04/08/2016   Procedure: LAPAROSCOPIC ASSISTED ASCENDING COLECTOMY POSSIBLE OPEN COLECTOMY;  Surgeon: Fanny Skates, MD;  Location: [facility];  Service: General;  Laterality: N/A;   orthopedic surgeries     multiple   REDUCTION MAMMAPLASTY Bilateral    SHOULDER OPEN ROTATOR CUFF REPAIR     rt and lft   TEE WITHOUT CARDIOVERSION N/A 11/23/2017   Procedure: TRANSESOPHAGEAL ECHOCARDIOGRAM (TEE);  Surgeon: Jerline Pain, MD;  Location: [facility] ENDOSCOPY;  Service: Cardiovascular;  Laterality: N/A;   TUBAL LIGATION     btsp    Social History:  reports that she has never smoked. She has never used smokeless tobacco. She reports that she does not drink alcohol or use drugs.  Allergies  Allergen Reactions   Codeine Nausea And Vomiting   Ambien [Zolpidem Tartrate] Other (See Comments)    Up sleep walking and eating   Crestor [Rosuvastatin] Other (See Comments)    Muscle weakness, cramps and aching all over body   Lipitor [Atorvastatin] Itching and Other (See Comments)    Muscle weakness, cramps and aches all over body   Morphine And Related Other (See Comments)    Flushing and feeling hot Tolerates with Diphenhydramine   Penicillins Rash and Other (See Comments)    Caused Headaches also Has patient had a PCN reaction causing immediate rash, facial/tongue/throat swelling, SOB or lightheadedness with hypotension: No Has patient had a PCN reaction causing severe rash involving mucus membranes or skin necrosis: No Has patient had a PCN reaction that required hospitalization No Has patient had a PCN reaction occurring within  the last 10 years: No If all of the above answers are "NO", then may proceed with Cephalosporin use.     Statins Other (See Comments)    Muscle weakness, cramps and aching all over body   Dilaudid [Hydromorphone Hcl] Other (See Comments)    Hallucinations/ argumentative, goes beserk   Heparin Other (See Comments)    HIT ab positive   Amitriptyline Other (See Comments)    Loopy feeling   Ceftin [Cefuroxime Axetil] Other (See Comments)    Stomach pain    Lovaza [Omega-3-Acid Ethyl Esters] Other (See Comments)    Stomach pain    Lunesta [Eszopiclone] Other (See Comments)    Causes dizziness   Lyrica [Pregabalin] Nausea Only   Metformin And Related Nausea Only    Family History  Problem Relation Age of Onset   Heart attack Father        42s   Hypothyroidism Father  Diabetes type II Father    Diabetes type II Mother    Hypertension Mother    Breast cancer Mother 36   Hypothyroidism Brother    Breast cancer Maternal Aunt    Breast cancer Cousin    Breast cancer Maternal Aunt    Breast cancer Cousin      Prior to Admission medications   Medication Sig Start Date End Date Taking? Authorizing Provider  acetaminophen (TYLENOL) 500 MG tablet Take 1,000 mg by mouth every 8 (eight) hours as needed for mild pain or headache.   Yes [provider]  amLODipine (NORVASC) 2.5 MG tablet Take 2.5 mg by mouth daily. 10/27/18  Yes [provider]  aspirin EC 81 MG tablet Take 81 mg by mouth daily.   Yes [provider]  clobetasol (TEMOVATE) 0.05 % external solution Apply 1 application topically See admin instructions. Apply twice daily to scalp and ear as needed for itching/rash (do not apply to face/groin/underarms) 07/13/16  Yes [provider]  Cyanocobalamin (B-12) 2500 MCG TABS Take 2,500 mcg by mouth daily.   Yes [provider]  cyclobenzaprine (FLEXERIL) 10 MG tablet Take 1 tablet (10 mg total) by mouth 3 (three) times  daily as needed for muscle spasms. 03/24/17  Yes Costella, Vista Mink, PA-C  dexlansoprazole (DEXILANT) 60 MG capsule Take 60 mg by mouth daily.   Yes [provider]  docusate sodium (COLACE) 100 MG capsule Take 1 capsule (100 mg total) by mouth 2 (two) times daily. 11/17/17  Yes Newman Pies, MD  DULoxetine (CYMBALTA) 60 MG capsule Take 60 mg by mouth daily.    Yes [provider]  ferrous sulfate 325 (65 FE) MG tablet Take 1 tablet (325 mg total) by mouth 2 (two) times daily with a meal. 11/17/17  Yes Newman Pies, MD  fluticasone [facility]) 50 MCG/ACT nasal spray Place 2 sprays into both nostrils daily as needed for allergies or rhinitis.   Yes [provider]  furosemide (LASIX) 20 MG tablet Take 20 mg by mouth daily as needed for fluid or edema.    Yes [provider]  gabapentin (NEURONTIN) 300 MG capsule Take 600 mg by mouth at bedtime.  03/30/18  Yes [provider]  ibuprofen (ADVIL) 200 MG tablet Take 400 mg by mouth every 6 (six) hours as needed for headache (pain).   Yes [provider]  insulin degludec (TRESIBA FLEXTOUCH) 100 UNIT/ML SOPN FlexTouch Pen Inject 40 Units into the skin daily.   Yes [provider]  insulin lispro (HUMALOG KWIKPEN) 100 UNIT/ML KwikPen Inject 14 Units into the skin 3 (three) times daily.   Yes [provider]  irbesartan (AVAPRO) 75 MG tablet Take 75 mg by mouth daily. 11/15/18  Yes [provider]  levothyroxine (SYNTHROID, LEVOTHROID) 150 MCG tablet Take 150 mcg by mouth daily before breakfast. 10/10/17  Yes [provider]  LORazepam (ATIVAN) 0.5 MG tablet Take 1 tablet (0.5 mg total) by mouth every 8 (eight) hours as needed for anxiety. 05/17/18 05/17/19 Yes Shah, Pratik D, DO  meclizine (ANTIVERT) 12.5 MG tablet Take 25 mg by mouth 3 (three) times daily as needed for dizziness.   Yes [provider]  Pitavastatin Calcium (LIVALO) 2 MG TABS Take 2 mg by mouth  every Wednesday.   Yes [provider]  Probiotic Product (PROBIOTIC PO) Take 1 capsule by mouth daily.   Yes [provider]  promethazine (PHENERGAN) 25 MG tablet Take 25 mg by mouth every  8 (eight) hours as needed for nausea or vomiting.   Yes [provider]  rivaroxaban (XARELTO) 20 MG TABS tablet Take 20 mg by mouth daily.   Yes [provider]  temazepam (RESTORIL) 15 MG capsule Take 15 mg by mouth at bedtime.  10/10/17  Yes [provider]  insulin aspart protamine- aspart (NOVOLOG MIX 70/30) (70-30) 100 UNIT/ML injection Inject 0.38 mLs (38 Units total) into the skin 2 (two) times daily with a meal. Patient not taking: Reported on 12/15/2018 05/17/18   Heath Lark D, DO  oxyCODONE (OXY IR/ROXICODONE) 5 MG immediate release tablet Take 1 tablet (5 mg total) by mouth every 4 (four) hours as needed for moderate pain ((score 4 to 6)). Patient not taking: Reported on 12/15/2018 11/17/17   Newman Pies, MD  oxyCODONE-acetaminophen (PERCOCET/ROXICET) 5-325 MG tablet Take 1 tablet by mouth every 6 (six) hours as needed (pain).  10/26/18   [provider]    Physical Exam: Vitals:   12/15/18 1530 12/15/18 1600 12/15/18 1715 12/15/18 1730  BP: 132/86 (!) 149/74 (!) 161/89 (!) 145/81  Pulse: (!) 111 (!) 105  98  Resp: 17 (!) 22  20  Temp:      TempSrc:      SpO2: 95% 97%  94%  Weight:       Wt Readings from Last 3 Encounters:  12/15/18 88.4 kg  05/17/18 88.4 kg  11/25/17 88.5 kg   Body mass index is 34.52 kg/m.  General: Morbidly obese in mild distress, NG tube in place HENT: Normocephalic, pupils equally reacting to light and accommodation.  No scleral pallor or icterus noted. Oral mucosa is mildly dry Chest:  Clear breath sounds.  Diminished breath sounds bilaterally. No crackles or wheezes.  CVS: S1 &S2 heard. No murmur.  Regular rate and rhythm. Abdomen: Healed surgical scar noted.  Bowel sounds present.  Nonspecific tenderness  on palpation.  Obese abdomen. Extremities: No cyanosis, clubbing or edema.  Peripheral pulses are palpable. Psych: Alert, awake and oriented, normal mood CNS:  No cranial nerve deficits.  Power equal in all extremities.   No cerebellar signs.   Skin: Warm and dry.  No rashes noted.   Labs on Admission:   CBC: Recent Labs  Lab 12/15/18 1521  WBC 13.3*  NEUTROABS 11.3*  HGB 16.2*  HCT 48.2*  MCV 81.7  PLT 706    Basic Metabolic Panel: Recent Labs  Lab 12/15/18 1521  NA 134*  K 4.4  CL 100  CO2 19*  GLUCOSE 429*  BUN 13  CREATININE 1.09*  CALCIUM 9.5    Liver Function Tests: Recent Labs  Lab 12/15/18 1521  AST 15  ALT 14  ALKPHOS 122  BILITOT 1.1  PROT 7.1  ALBUMIN 3.9   Recent Labs  Lab 12/15/18 1521  LIPASE 22   No results for input(s): AMMONIA in the last 168 hours.  Cardiac Enzymes: Recent Labs  Lab 12/15/18 1521  TROPONINI <0.03    BNP (last 3 results) No results for input(s): BNP in the last 8760 hours.  ProBNP (last 3 results) No results for input(s): PROBNP in the last 8760 hours.  CBG: Recent Labs  Lab 12/15/18 1520  GLUCAP 403*    Lipase     Component Value Date/Time   LIPASE 22 12/15/2018 1521     Urinalysis    Component Value Date/Time   COLORURINE YELLOW 12/15/2018 1716   APPEARANCEUR CLEAR 12/15/2018 1716   LABSPEC 1.040 (H) 12/15/2018 1716  PHURINE 5.0 12/15/2018 1716   GLUCOSEU >=500 (A) 12/15/2018 1716   HGBUR SMALL (A) 12/15/2018 1716   BILIRUBINUR NEGATIVE 12/15/2018 1716   KETONESUR 20 (A) 12/15/2018 1716   PROTEINUR NEGATIVE 12/15/2018 1716   UROBILINOGEN 0.2 07/30/2012 2228   NITRITE NEGATIVE 12/15/2018 1716   LEUKOCYTESUR NEGATIVE 12/15/2018 1716     Drugs of Abuse     Component Value Date/Time   LABOPIA POSITIVE (A) 11/18/2017 1520   COCAINSCRNUR NONE DETECTED 11/18/2017 1520   LABBENZ POSITIVE (A) 11/18/2017 1520   AMPHETMU NONE DETECTED 11/18/2017 1520   THCU NONE DETECTED 11/18/2017 1520    LABBARB NONE DETECTED 11/18/2017 1520      Radiological Exams on Admission: Ct Abdomen Pelvis W Contrast  Result Date: 12/15/2018 CLINICAL DATA:  Left-sided abdominal pain for several months EXAM: CT ABDOMEN AND PELVIS WITH CONTRAST TECHNIQUE: Multidetector CT imaging of the abdomen and pelvis was performed using the standard protocol following bolus administration of intravenous contrast. CONTRAST:  167mL OMNIPAQUE 300 COMPARISON:  01/08/2018 FINDINGS: Lower chest: No acute abnormality. Hepatobiliary: No focal liver abnormality is seen. Status post cholecystectomy. No biliary dilatation. Pancreas: Unremarkable. No pancreatic ductal dilatation or surrounding inflammatory changes. Spleen: Normal in size without focal abnormality. Adrenals/Urinary Tract: Adrenal glands are within normal limits. Kidneys demonstrate a normal enhancement pattern bilaterally. No renal calculi or obstructive changes are seen. Delayed images demonstrate normal excretion of contrast material. Bladder is well distended. Stomach/Bowel: Mild diverticular change of the colon is noted. No diverticulitis is seen. Postsurgical changes are noted in the ascending colon. The anastomosis appears patent with decompressed ileum proximal to the anastomosis. Several loops proximal to the anastomosis however there is a focal abrupt caliber change with more proximal small bowel dilatation consistent with at least partial small bowel obstruction. The appearance and location suggests underlying adhesions. The stomach is within normal limits. Vascular/Lymphatic: Aortic atherosclerosis. No enlarged abdominal or pelvic lymph nodes. Reproductive: Uterus and bilateral adnexa are unremarkable. Other: No abdominal wall hernia or abnormality. No abdominopelvic ascites. Musculoskeletal: Degenerative changes of the lumbar spine are noted. Postsurgical changes are seen from L2-L4. IMPRESSION: Postsurgical changes in the right mid abdomen with changes consistent with  adhesions and at least partial distal small bowel obstruction as described. Chronic changes without acute abnormality. Electronically Signed   By: Inez Catalina M.D.   On: 12/15/2018 17:15   Dg Chest Portable 1 View  Result Date: 12/15/2018 CLINICAL DATA:  Acute chest pain. EXAM: PORTABLE CHEST 1 VIEW COMPARISON:  05/15/2018 and prior radiographs FINDINGS: The cardiomediastinal silhouette is unremarkable. There is no evidence of focal airspace disease, pulmonary edema, suspicious pulmonary nodule/mass, pleural effusion, or pneumothorax. No acute bony abnormalities are identified. LOWER cervical spine fusion hardware again noted. IMPRESSION: No active disease. Electronically Signed   By: Margarette Canada M.D.   On: 12/15/2018 15:22    EKG: Personally reviewed by me which shows sinus tachycardia  Assessment/Plan Principal Problem:   Partial bowel obstruction (HCC) Active Problems:   Type II diabetes mellitus (HCC)   Hypothyroidism   Hypertension   Acute kidney injury ([place])   Diastolic dysfunction  Partial small bowel obstruction likely secondary to adhesions.  We will keep the patient n.p.o., IV fluids, IV antiemetics, IV analgesics as needed for pain.  Surgery has been consulted from the ED.  Will follow recommendations.  Continue to replenish electrolytes as necessary.  Diabetes mellitus type 2 on insulin hyperglycemia on presentation.  Patient has bowel obstruction with nausea vomiting.  Continue on IV  fluid hydration.  Continue on sliding scale insulin and basal insulin - decreased dose.  History of hypothyroidism.  Hold oral Synthroid for now.  Consider IV Synthroid if unable to take p.o. in 2 to 3 days.  Mild acute kidney injury secondary to volume depletion and bowel obstruction.  Continue IV fluid hydration.  Check BMP in a.m.  Hold Lasix.  History of diastolic dysfunction.  Currently volume depleted.  Continue IV fluid hydration.  Essential hypertension.  Will closely monitor.  Will  consider PRN antihypertensives as needed.  GERD.  Continue with Protonix.  History of fibromyalgia.  On gabapentin.  Will hold at this time.  History of stroke.  Hold oral medications for now.  DVT.  On Xarelto as outpatient.  Will change to Lovenox pharmacy to dose.  Consultant: General surgery  Code Status: Full code  DVT Prophylaxis: Lovenox  Antibiotics: None  Family Communication:  Patients' condition and plan of care including tests being ordered have been discussed with the patient who indicate understanding and agree with the plan.  Disposition Plan: Home  Severity of Illness: The appropriate patient status for this patient is INPATIENT. Inpatient status is judged to be reasonable and necessary in order to provide the required intensity of service to ensure the patient's safety. The patient's presenting symptoms, physical exam findings, and initial radiographic and laboratory data in the context of their chronic comorbidities is felt to place them at high risk for further clinical deterioration. Furthermore, it is not anticipated that the patient will be medically stable for discharge from the hospital within 2 midnights of admission.  I certify that at the point of admission it is my clinical judgment that the patient will require inpatient hospital care spanning beyond 2 midnights from the point of admission due to high intensity of service, high risk for further deterioration and high frequency of surveillance required.*   Signed, Flora Lipps, MD [facility] 12/15/2018

## 2018-12-15 NOTE — ED Notes (Signed)
ED TO INPATIENT HANDOFF REPORT  ED Nurse Name and Phone #: Ruthene Methvin [phone]  S Name/Age/Gender [name] 68 y.o. female Room/Bed: 045C/045C  Code Status   Code Status: Full Code  Home/SNF/Other Home Patient oriented to: self, place, time and situation Is this baseline? Yes   Triage Complete: Triage complete  Chief Complaint Dizziness; Weakness; N/V  Triage Note Pt in with hyperglycemia (CBG 466 PTA), bile emesis since last night and weakness per son. Hx of DKA, denies any fever, cough or sob   Allergies Allergies  Allergen Reactions  . Codeine Nausea And Vomiting  . Ambien [Zolpidem Tartrate] Other (See Comments)    Up sleep walking and eating  . Crestor [Rosuvastatin] Other (See Comments)    Muscle weakness, cramps and aching all over body  . Lipitor [Atorvastatin] Itching and Other (See Comments)    Muscle weakness, cramps and aches all over body  . Morphine And Related Other (See Comments)    Flushing and feeling hot Tolerates with Diphenhydramine  . Penicillins Rash and Other (See Comments)    Caused Headaches also Has patient had a PCN reaction causing immediate rash, facial/tongue/throat swelling, SOB or lightheadedness with hypotension: No Has patient had a PCN reaction causing severe rash involving mucus membranes or skin necrosis: No Has patient had a PCN reaction that required hospitalization No Has patient had a PCN reaction occurring within the last 10 years: No If all of the above answers are "NO", then may proceed with Cephalosporin use.    . Statins Other (See Comments)    Muscle weakness, cramps and aching all over body  . Dilaudid [Hydromorphone Hcl] Other (See Comments)    Hallucinations/ argumentative, goes beserk  . Heparin Other (See Comments)    HIT ab positive  . Amitriptyline Other (See Comments)    Loopy feeling  . Ceftin [Cefuroxime Axetil] Other (See Comments)    Stomach pain   . Lovaza [Omega-3-Acid Ethyl Esters] Other (See  Comments)    Stomach pain   . Lunesta [Eszopiclone] Other (See Comments)    Causes dizziness  . Lyrica [Pregabalin] Nausea Only  . Metformin And Related Nausea Only    Level of Care/Admitting Diagnosis ED Disposition    ED Disposition Condition [facility] Area: [place] [[id number]]  Level of Care: Telemetry Medical [104]  Covid Evaluation: N/A  Diagnosis: Partial bowel obstruction [facility]) [[id number]]  Admitting Physician: Flora Lipps [[id number]]  Attending Physician: Flora Lipps [[id number]]  Estimated length of stay: past midnight tomorrow  Certification:: I certify this patient will need inpatient services for at least 2 midnights  PT Class (Do Not Modify): Inpatient [101]  PT Acc Code (Do Not Modify): Private [1]       B Medical/Surgery History Past Medical History:  Diagnosis Date  . Anemia    iron deficiency  . Arthritis   . Blood transfusion   . Cervical spondylosis with myelopathy and radiculopathy   . Depression   . Diabetes mellitus    Type II  . Diastolic dysfunction   . Diverticulitis   . DJD (degenerative joint disease)   . DVT (deep venous thrombosis) (HCC)    times 2 lower leg  . Endometriosis   . Family history of adverse reaction to anesthesia    mom and dad PONV  . Fibromyalgia   . GERD (gastroesophageal reflux disease)    occ  . Hiatal hernia   . History of kidney stones   . Hyperlipidemia   .  Hypertension   . Hypothyroidism   . Insomnia   . Mild aortic insufficiency    by echo 04/2013  . Osteoarthritis    back and knee  . Ovarian cyst   . PONV (postoperative nausea and vomiting)   . RLS (restless legs syndrome)   . Thoracic compression fracture ([place])   . Uterine fibroid   . UTI (lower urinary tract infection)   . Villous adenoma of right colon 04/08/2016  . Vitamin D deficiency disease    Past Surgical History:  Procedure Laterality Date  . ANTERIOR CERVICAL DECOMP/DISCECTOMY FUSION N/A 03/23/2017    Procedure: ANTERIOR CERVICAL DECOMPRESSION/DISCECTOMY FUSION, INTERBODY PROSTHESIS,PLATE CERVICAL THREE- CERVICAL FOUR, CERVICAL FOUR- CERVICAL FIVE, CERVICAL FIVE- CERVICAL SIX;  Surgeon: Newman Pies, MD;  Location: [place];  Service: Neurosurgery;  Laterality: N/A;  . APPENDECTOMY    . BACK SURGERY  ,2005   April  2013 - [facility] fusion[facility]  . BREAST SURGERY     breast reduction  . CARDIAC CATHETERIZATION  04/2013   normal coronary arteries and normal LVF  . CARPAL TUNNEL RELEASE  06/05/2012   Procedure: CARPAL TUNNEL RELEASE;  Surgeon: Cammie Sickle., MD;  Location: [place];  Service: Orthopedics;  Laterality: Left;  . CESAREAN SECTION     x 2  . CHOLECYSTECTOMY    . COLECTOMY  04/08/2016  . DILATION AND CURETTAGE OF UTERUS    . DORSAL COMPARTMENT RELEASE  06/05/2012   Procedure: RELEASE DORSAL COMPARTMENT (DE[place]ERVAIN);  Surgeon: Cammie Sickle., MD;  Location: [facility];  Service: Orthopedics;  Laterality: Left;  Excision of mixoid cyst also  . EYE SURGERY     cataracts bilateral  . JOINT REPLACEMENT     Bilateral knee  . KNEE ARTHROPLASTY  09   lft partial  . KNEE ARTHROPLASTY     rt  . LAPAROSCOPIC PARTIAL COLECTOMY N/A 04/08/2016   Procedure: LAPAROSCOPIC ASSISTED ASCENDING COLECTOMY POSSIBLE OPEN COLECTOMY;  Surgeon: Fanny Skates, MD;  Location: [place];  Service: General;  Laterality: N/A;  . orthopedic surgeries     multiple  . REDUCTION MAMMAPLASTY Bilateral   . SHOULDER OPEN ROTATOR CUFF REPAIR     rt and lft  . TEE WITHOUT CARDIOVERSION N/A 11/23/2017   Procedure: TRANSESOPHAGEAL ECHOCARDIOGRAM (TEE);  Surgeon: Jerline Pain, MD;  Location: [facility] ENDOSCOPY;  Service: Cardiovascular;  Laterality: N/A;  . TUBAL LIGATION     btsp     A IV Location/Drains/Wounds Patient Lines/Drains/Airways Status   Active Line/Drains/Airways    Name:   Placement date:   Placement time:   Site:   Days:   Peripheral IV 12/15/18 Left  Antecubital   12/15/18    1521    Antecubital   less than 1   NG/OG Tube Nasogastric 16 Fr. Right nare Xray;Aucultation Measured external length of tube   12/15/18    1754    Right nare   less than 1          Intake/Output Last 24 hours No intake or output data in the 24 hours ending 12/15/18 1759  Labs/Imaging Results for orders placed or performed during the hospital encounter of 12/15/18 (from the past 48 hour(s))  POC CBG, ED     Status: Abnormal   Collection Time: 12/15/18  3:20 PM  Result Value Ref Range   Glucose-Capillary 403 (H) 70 - 99 mg/dL   Comment 1 Notify RN    Comment 2 Document in Chart  CBC with Differential     Status: Abnormal   Collection Time: 12/15/18  3:21 PM  Result Value Ref Range   WBC 13.3 (H) 4.0 - 10.5 K/uL   RBC 5.90 (H) 3.87 - 5.11 MIL/uL   Hemoglobin 16.2 (H) 12.0 - 15.0 g/dL   HCT 48.2 (H) 36.0 - 46.0 %   MCV 81.7 80.0 - 100.0 fL   MCH 27.5 26.0 - 34.0 pg   MCHC 33.6 30.0 - 36.0 g/dL   RDW 12.6 11.5 - 15.5 %   Platelets 346 150 - 400 K/uL   nRBC 0.0 0.0 - 0.2 %   Neutrophils Relative % 86 %   Neutro Abs 11.3 (H) 1.7 - 7.7 K/uL   Lymphocytes Relative 11 %   Lymphs Abs 1.5 0.7 - 4.0 K/uL   Monocytes Relative 3 %   Monocytes Absolute 0.4 0.1 - 1.0 K/uL   Eosinophils Relative 0 %   Eosinophils Absolute 0.0 0.0 - 0.5 K/uL   Basophils Relative 0 %   Basophils Absolute 0.1 0.0 - 0.1 K/uL   Immature Granulocytes 0 %   Abs Immature Granulocytes 0.04 0.00 - 0.07 K/uL    Comment: Performed at [facility], [address], [place], [place] [address]  Comprehensive metabolic panel     Status: Abnormal   Collection Time: 12/15/18  3:21 PM  Result Value Ref Range   Sodium 134 (L) 135 - 145 mmol/L   Potassium 4.4 3.5 - 5.1 mmol/L   Chloride 100 98 - 111 mmol/L   CO2 19 (L) 22 - 32 mmol/L   Glucose, Bld 429 (H) 70 - 99 mg/dL   BUN 13 8 - 23 mg/dL   Creatinine, Ser 1.09 (H) 0.44 - 1.00 mg/dL   Calcium 9.5 8.9 - 10.3 mg/dL   Total Protein  7.1 6.5 - 8.1 g/dL   Albumin 3.9 3.5 - 5.0 g/dL   AST 15 15 - 41 U/L   ALT 14 0 - 44 U/L   Alkaline Phosphatase 122 38 - 126 U/L   Total Bilirubin 1.1 0.3 - 1.2 mg/dL   GFR calc non Af Amer 52 (L) >60 mL/min   GFR calc Af Amer >60 >60 mL/min   Anion gap 15 5 - 15    Comment: Performed at [facility] [address], [place], [place] [address]  Lipase, blood     Status: None   Collection Time: 12/15/18  3:21 PM  Result Value Ref Range   Lipase 22 11 - 51 U/L    Comment: Performed at [facility] [address] [place], [place] [address]  Troponin I - ONCE - STAT     Status: None   Collection Time: 12/15/18  3:21 PM  Result Value Ref Range   Troponin I <0.03 <0.03 ng/mL    Comment: Performed at [facility], [place] [address], [place], [place] [address]  SARS Coronavirus 2 (CEPHEID - Performed in [place] hospital lab), Hosp Order     Status: None   Collection Time: 12/15/18  3:21 PM  Result Value Ref Range   SARS Coronavirus 2 NEGATIVE NEGATIVE    Comment: (NOTE) If result is NEGATIVE SARS-CoV-2 target nucleic acids are NOT DETECTED. The SARS-CoV-2 RNA is generally detectable in upper and lower  respiratory specimens during the acute phase of infection. The lowest  concentration of SARS-CoV-2 viral copies this assay can detect is 250  copies / mL. A negative result does  not preclude SARS-CoV-2 infection  and should not be used as the sole basis for treatment or other  patient management decisions.  A negative result may occur with  improper specimen collection / handling, submission of specimen other  than nasopharyngeal swab, presence of viral mutation(s) within the  areas targeted by this assay, and inadequate number of viral copies  (<250 copies / mL). A negative result must be combined with clinical  observations, patient history, and epidemiological information. If result is POSITIVE SARS-CoV-2 target nucleic acids are DETECTED. The SARS-CoV-2 RNA is  generally detectable in upper and lower  respiratory specimens dur ing the acute phase of infection.  Positive  results are indicative of active infection with SARS-CoV-2.  Clinical  correlation with patient history and other diagnostic information is  necessary to determine patient infection status.  Positive results do  not rule out bacterial infection or co-infection with other viruses. If result is PRESUMPTIVE POSTIVE SARS-CoV-2 nucleic acids MAY BE PRESENT.   A presumptive positive result was obtained on the submitted specimen  and confirmed on repeat testing.  While 2019 novel coronavirus  (SARS-CoV-2) nucleic acids may be present in the submitted sample  additional confirmatory testing may be necessary for epidemiological  and / or clinical management purposes  to differentiate between  SARS-CoV-2 and other Sarbecovirus currently known to infect humans.  If clinically indicated additional testing with an alternate test  methodology [phone]) is advised. The SARS-CoV-2 RNA is generally  detectable in upper and lower respiratory sp ecimens during the acute  phase of infection. The expected result is Negative. Fact Sheet for Patients:  StrictlyIdeas.no Fact Sheet for Healthcare Providers: BankingDealers.co.za This test is not yet approved or cleared by the [place] FDA and has been authorized for detection and/or diagnosis of SARS-CoV-2 by FDA under an Emergency Use Authorization (EUA).  This EUA will remain in effect (meaning this test can be used) for the duration of the COVID-19 declaration under Section 564(b)(1) of the Act, 21 U.S.C. section 360bbb-3(b)(1), unless the authorization is terminated or revoked sooner. Performed at [facility], [place] [address] [place], [place] [address]   Urinalysis, Routine w reflex microscopic     Status: Abnormal   Collection Time: 12/15/18  5:16 PM  Result Value Ref Range   Color,  Urine YELLOW YELLOW   APPearance CLEAR CLEAR   Specific Gravity, Urine 1.040 (H) 1.005 - 1.030   pH 5.0 5.0 - 8.0   Glucose, UA >=500 (A) NEGATIVE mg/dL   Hgb urine dipstick SMALL (A) NEGATIVE   Bilirubin Urine NEGATIVE NEGATIVE   Ketones, ur 20 (A) NEGATIVE mg/dL   Protein, ur NEGATIVE NEGATIVE mg/dL   Nitrite NEGATIVE NEGATIVE   Leukocytes,Ua NEGATIVE NEGATIVE   Bacteria, UA NONE SEEN NONE SEEN   Squamous Epithelial / LPF 0-5 0 - 5    Comment: Performed at [facility], [place] [address], [place], [place] [address]   Ct Abdomen Pelvis W Contrast  Result Date: 12/15/2018 CLINICAL DATA:  Left-sided abdominal pain for several months EXAM: CT ABDOMEN AND PELVIS WITH CONTRAST TECHNIQUE: Multidetector CT imaging of the abdomen and pelvis was performed using the standard protocol following bolus administration of intravenous contrast. CONTRAST:  174mL OMNIPAQUE 300 COMPARISON:  01/08/2018 FINDINGS: Lower chest: No acute abnormality. Hepatobiliary: No focal liver abnormality is seen. Status post cholecystectomy. No biliary dilatation. Pancreas: Unremarkable. No pancreatic ductal dilatation or surrounding inflammatory changes. Spleen: Normal in size without focal abnormality. Adrenals/Urinary Tract: Adrenal glands  are within normal limits. Kidneys demonstrate a normal enhancement pattern bilaterally. No renal calculi or obstructive changes are seen. Delayed images demonstrate normal excretion of contrast material. Bladder is well distended. Stomach/Bowel: Mild diverticular change of the colon is noted. No diverticulitis is seen. Postsurgical changes are noted in the ascending colon. The anastomosis appears patent with decompressed ileum proximal to the anastomosis. Several loops proximal to the anastomosis however there is a focal abrupt caliber change with more proximal small bowel dilatation consistent with at least partial small bowel obstruction. The appearance and location suggests underlying  adhesions. The stomach is within normal limits. Vascular/Lymphatic: Aortic atherosclerosis. No enlarged abdominal or pelvic lymph nodes. Reproductive: Uterus and bilateral adnexa are unremarkable. Other: No abdominal wall hernia or abnormality. No abdominopelvic ascites. Musculoskeletal: Degenerative changes of the lumbar spine are noted. Postsurgical changes are seen from L2-L4. IMPRESSION: Postsurgical changes in the right mid abdomen with changes consistent with adhesions and at least partial distal small bowel obstruction as described. Chronic changes without acute abnormality. Electronically Signed   By: Inez Catalina M.D.   On: 12/15/2018 17:15   Dg Chest Portable 1 View  Result Date: 12/15/2018 CLINICAL DATA:  Acute chest pain. EXAM: PORTABLE CHEST 1 VIEW COMPARISON:  05/15/2018 and prior radiographs FINDINGS: The cardiomediastinal silhouette is unremarkable. There is no evidence of focal airspace disease, pulmonary edema, suspicious pulmonary nodule/mass, pleural effusion, or pneumothorax. No acute bony abnormalities are identified. LOWER cervical spine fusion hardware again noted. IMPRESSION: No active disease. Electronically Signed   By: Margarette Canada M.D.   On: 12/15/2018 15:22    Pending Labs Unresulted Labs (From admission, onward)    Start     Ordered   12/16/18 0500  Comprehensive metabolic panel  Tomorrow morning,   R     12/15/18 1755   12/16/18 0500  CBC  Tomorrow morning,   R     12/15/18 1755          Vitals/Pain Today's Vitals   12/15/18 1600 12/15/18 1623 12/15/18 1715 12/15/18 1730  BP: (!) 149/74  (!) 161/89 (!) 145/81  Pulse: (!) 105   98  Resp: (!) 22   20  Temp:      TempSrc:      SpO2: 97%   94%  Weight:      PainSc:  0-No pain      Isolation Precautions No active isolations  Medications Medications  0.9 %  sodium chloride infusion (has no administration in time range)  HYDROcodone-acetaminophen (NORCO/VICODIN) 5-325 MG per tablet 1-2 tablet (has no  administration in time range)  ondansetron (ZOFRAN) tablet 4 mg (has no administration in time range)    Or  ondansetron (ZOFRAN) injection 4 mg (has no administration in time range)  sodium chloride 0.9 % bolus 1,000 mL (0 mLs Intravenous Stopped 12/15/18 1623)  ondansetron (ZOFRAN) injection 4 mg (4 mg Intravenous Given 12/15/18 1523)  fentaNYL (SUBLIMAZE) injection 50 mcg (50 mcg Intravenous Given 12/15/18 1524)  iohexol (OMNIPAQUE) 300 MG/ML solution 100 mL (100 mLs Intravenous Contrast Given 12/15/18 1631)    Mobility walks Moderate fall risk   Focused Assessments Gastrointestinal   R Recommendations: See Admitting Provider Note  Report given to:   Additional Notes:

## 2018-12-15 NOTE — ED Provider Notes (Signed)
[facility] EMERGENCY DEPARTMENT Provider Note   CSN: [id number] Arrival date & time: 12/15/18  1447    History   Chief Complaint Chief Complaint  Patient presents with   Abdominal Pain    HPI [name] is a 68 y.o. female with a hx of insulin dependent diabetes mellitus, GERD, nephrolithiasis, HTN, hyperlipidemia, hypothyroidism, prior stroke, diastolic dysfunction, fibromyalgia& prior DVT on xarelto who presents to the ED with complaints of abdominal pain that began at 2100 last night. Patient states pain is located in the lower abdomen and radiates to the upper/generalized abdomen. It waxes/wanes in severity, currently a 10/10, no alleviating/aggravating factors, feels almost like contractions. She states she has had associated nausea w/ 7 episodes of emesis throughout the night into today. Notes last BM was 05/05 had a few days of diarrhea prior to onset of constipation. No longer passing gas. Tried miralax & dolculoax without relief. Has had poor appetite w/ generalized weakness. Had an episode today when she got up to brush her teeth where she felt lightheaded like she may pass out- no syncope occurred, has not re-occurred. She has hx of DM- sugars running in the 400s- took long acting & short acting insulin just PTA. Denies fever, chills, hematemesis, melena, hematochezia, or dysuria. Abdominal surgery history includes partial colectomy, appendectomy, cholecystectomy, c-section, and tubal ligation.   On further questioning she admits to some L sided chest and upper back pain intermittently x 1 week- episodes are brief, occur 1-2 times per day, but not daily. Pain occurs w/ movement of the LUE or lifting things. Seems to occur less if she has had ibuprofen or tylenol. Does some lifting w/ groceries & grandchildren, no specific injury she can recall. Denies dyspnea, cough, hemoptysis, or leg pain/swelling. Has been compliant w/ xarelto, no pleuritic component.       HPI  Past Medical History:  Diagnosis Date   Anemia    iron deficiency   Arthritis    Blood transfusion    Cervical spondylosis with myelopathy and radiculopathy    Depression    Diabetes mellitus    Type II   Diastolic dysfunction    Diverticulitis    DJD (degenerative joint disease)    DVT (deep venous thrombosis) (HCC)    times 2 lower leg   Endometriosis    Family history of adverse reaction to anesthesia    mom and dad PONV   Fibromyalgia    GERD (gastroesophageal reflux disease)    occ   Hiatal hernia    History of kidney stones    Hyperlipidemia    Hypertension    Hypothyroidism    Insomnia    Mild aortic insufficiency    by echo 04/2013   Osteoarthritis    back and knee   Ovarian cyst    PONV (postoperative nausea and vomiting)    RLS (restless legs syndrome)    Thoracic compression fracture (HCC)    Uterine fibroid    UTI (lower urinary tract infection)    Villous adenoma of right colon 04/08/2016   Vitamin D deficiency disease     Patient Active Problem List   Diagnosis Date Noted   Diabetic hyperosmolar non-ketotic state ([place]) 05/15/2018   Hyperlipidemia 05/15/2018   GERD (gastroesophageal reflux disease) 05/15/2018   Depression with anxiety 05/15/2018   Cerebral thrombosis with cerebral infarction 11/22/2017   Acute encephalopathy    DKA (diabetic ketoacidoses) ([place]) 11/18/2017   Diabetic ketoacidosis ([place]) 11/18/2017   Acute respiratory failure  with hypoxia ([place])    Acute renal failure ([place])    Endotracheal tube present    Septic shock (HCC)    Spondylolisthesis of lumbar region 11/15/2017   PAC (premature atrial contraction) 09/04/2017   Cervical spondylosis with radiculopathy 03/23/2017   Hypoxia    Villous adenoma of right colon 04/08/2016   Leukocytosis 04/08/2016   Anemia    Diastolic dysfunction    Mild aortic insufficiency    Other nonspecific abnormal cardiovascular system  function study 05/03/2013   SOB (shortness of breath) 05/01/2013   Atypical chest pain 02/28/2013   Dysphagia 02/28/2013   UTI (lower urinary tract infection) 07/06/2012   Intractable nausea and vomiting 07/06/2012   Blood loss anemia 07/06/2012   Acute kidney injury ([place]) 07/06/2012   Epigastric abdominal pain 06/27/2012   Type II diabetes mellitus ([place]) 07/28/2011   Hypothyroidism 07/28/2011   Hypertension 07/28/2011   Hepatic steatosis 07/28/2011    Past Surgical History:  Procedure Laterality Date   ANTERIOR CERVICAL DECOMP/DISCECTOMY FUSION N/A 03/23/2017   Procedure: ANTERIOR CERVICAL DECOMPRESSION/DISCECTOMY FUSION, INTERBODY PROSTHESIS,PLATE CERVICAL THREE- CERVICAL FOUR, CERVICAL FOUR- CERVICAL FIVE, CERVICAL FIVE- CERVICAL SIX;  Surgeon: Newman Pies, MD;  Location: [facility];  Service: Neurosurgery;  Laterality: N/A;   APPENDECTOMY     BACK SURGERY  ,2005   April  2013 - [facility] fusion[facility]   BREAST SURGERY     breast reduction   CARDIAC CATHETERIZATION  04/2013   normal coronary arteries and normal LVF   CARPAL TUNNEL RELEASE  06/05/2012   Procedure: CARPAL TUNNEL RELEASE;  Surgeon: Cammie Sickle., MD;  Location: [facility];  Service: Orthopedics;  Laterality: Left;   CESAREAN SECTION     x 2   CHOLECYSTECTOMY     COLECTOMY  04/08/2016   DILATION AND CURETTAGE OF UTERUS     DORSAL COMPARTMENT RELEASE  06/05/2012   Procedure: RELEASE DORSAL COMPARTMENT (DEQUERVAIN);  Surgeon: Cammie Sickle., MD;  Location[facility];  Service: Orthopedics;  Laterality: Left;  Excision of mixoid cyst also   EYE SURGERY     cataracts bilateral   JOINT REPLACEMENT     Bilateral knee   KNEE ARTHROPLASTY  09   lft partial   KNEE ARTHROPLASTY     rt   LAPAROSCOPIC PARTIAL COLECTOMY N/A 04/08/2016   Procedure: LAPAROSCOPIC ASSISTED ASCENDING COLECTOMY POSSIBLE OPEN COLECTOMY;  Surgeon: Fanny Skates, MD;  Location: [facility];  Service: General;  Laterality: N/A;   orthopedic surgeries     multiple   REDUCTION MAMMAPLASTY Bilateral    SHOULDER OPEN ROTATOR CUFF REPAIR     rt and lft   TEE WITHOUT CARDIOVERSION N/A 11/23/2017   Procedure: TRANSESOPHAGEAL ECHOCARDIOGRAM (TEE);  Surgeon: Jerline Pain, MD;  Location[facility] ENDOSCOPY;  Service: Cardiovascular;  Laterality: N/A;   TUBAL LIGATION     btsp     OB History    Gravida  2   Para  2   Term  2   Preterm      AB      Living  2     SAB      TAB      Ectopic      Multiple      Live Births               Home Medications    Prior to Admission medications   Medication Sig Start Date End Date Taking? Authorizing Provider  acetaminophen (  TYLENOL) 500 MG tablet Take 1,000 mg by mouth every 8 (eight) hours as needed for mild pain or headache.    [provider]  amLODipine (NORVASC) 5 MG tablet Take 2.5 mg by mouth daily.     [provider]  aspirin EC 81 MG tablet Take 81 mg by mouth daily.    [provider]  clobetasol (TEMOVATE) 0.05 % external solution Apply 1 application topically See admin instructions. Apply twice daily to scalp and ear as needed for itching/rash (do not apply to face/groin/underarms) 07/13/16   [provider]  Cyanocobalamin (B-12) 2500 MCG TABS Take 2,500 mcg by mouth daily.    [provider]  cyclobenzaprine (FLEXERIL) 10 MG tablet Take 1 tablet (10 mg total) by mouth 3 (three) times daily as needed for muscle spasms. 03/24/17   Costella, Vincent J, PA-C  DEXILANT 60 MG capsule Take 60 mg by mouth daily.  05/14/18   [provider]  docusate sodium (COLACE) 100 MG capsule Take 1 capsule (100 mg total) by mouth 2 (two) times daily. 11/17/17   Newman Pies, MD  DULoxetine (CYMBALTA) 60 MG capsule Take 120 mg by mouth daily.     [provider]  ferrous sulfate 325 (65 FE) MG tablet Take 1 tablet (325 mg total) by mouth 2 (two) times daily with  a meal. 11/17/17   Newman Pies, MD  fluticasone [facility]) 50 MCG/ACT nasal spray Place 2 sprays into both nostrils daily as needed for allergies or rhinitis.    [provider]  furosemide (LASIX) 20 MG tablet Take 20 mg by mouth daily as needed for fluid or edema.     [provider]  gabapentin (NEURONTIN) 300 MG capsule Take 300 mg by mouth at bedtime. *May take 600mg  at bedtime if needed 03/30/18   [provider]  insulin aspart protamine- aspart (NOVOLOG MIX 70/30) (70-30) 100 UNIT/ML injection Inject 0.38 mLs (38 Units total) into the skin 2 (two) times daily with a meal. 05/17/18   Manuella Ghazi, Pratik D, DO  levothyroxine (SYNTHROID, LEVOTHROID) 150 MCG tablet Take 150 mcg by mouth daily before breakfast. 10/10/17   [provider]  LORazepam (ATIVAN) 0.5 MG tablet Take 1 tablet (0.5 mg total) by mouth every 8 (eight) hours as needed for anxiety. 05/17/18 05/17/19  Manuella Ghazi, Pratik D, DO  oxyCODONE (OXY IR/ROXICODONE) 5 MG immediate release tablet Take 1 tablet (5 mg total) by mouth every 4 (four) hours as needed for moderate pain ((score 4 to 6)). 11/17/17   Newman Pies, MD  Pitavastatin Calcium (LIVALO) 2 MG TABS Take 2 mg by mouth every Wednesday.    [provider]  Probiotic Product (PROBIOTIC PO) Take 1 capsule by mouth daily.    [provider]  promethazine (PHENERGAN) 25 MG tablet Take 25 mg by mouth every 8 (eight) hours as needed for nausea or vomiting.    [provider]  rivaroxaban (XARELTO) 20 MG TABS tablet Take 20 mg by mouth daily.    [provider]  temazepam (RESTORIL) 15 MG capsule Take 15 mg by mouth at bedtime.  10/10/17   [provider]  valsartan (DIOVAN) 40 MG tablet Take 40 mg by mouth every evening. 04/16/18   [provider]    Family History Family History  Problem Relation Age of Onset   Heart attack Father        19s   Hypothyroidism Father    Diabetes type II Father  Diabetes type II Mother    Hypertension Mother    Breast cancer Mother 61   Hypothyroidism Brother    Breast cancer Maternal Aunt    Breast cancer Cousin    Breast cancer Maternal Aunt    Breast cancer Cousin     Social History Social History   Tobacco Use   Smoking status: Never Smoker   Smokeless tobacco: Never Used  Substance Use Topics   Alcohol use: No   Drug use: No     Allergies   Codeine; Ambien [zolpidem tartrate]; Crestor [rosuvastatin]; Lipitor [atorvastatin]; Morphine and related; Penicillins; Statins; Dilaudid [hydromorphone hcl]; Heparin; Amitriptyline; Ceftin [cefuroxime axetil]; Lovaza [omega-3-acid ethyl esters]; Lunesta [eszopiclone]; Lyrica [pregabalin]; and Metformin and related   Review of Systems Review of Systems  Constitutional: Negative for chills and fever.  Respiratory: Negative for cough and shortness of breath.   Cardiovascular: Positive for chest pain. Negative for palpitations and leg swelling.  Gastrointestinal: Positive for abdominal pain, constipation, nausea and vomiting. Negative for anal bleeding, blood in stool and diarrhea.  Genitourinary: Negative for dysuria, vaginal bleeding and vaginal discharge.  Musculoskeletal: Positive for back pain.  Neurological: Positive for weakness (generalized) and light-headedness. Negative for syncope.  All other systems reviewed and are negative.    Physical Exam Updated Vital Signs BP (!) 173/106 (BP Location: Left Arm)    Pulse (!) 114    Temp 98.3 F (36.8 C) (Oral)    Resp (!) 21    Wt 88.4 kg    SpO2 95%    BMI 34.52 kg/m   Physical Exam Vitals signs and nursing note reviewed.  Constitutional:      General: She is in acute distress (appears uncomfortable).     Appearance: She is well-developed. She is not toxic-appearing.  HENT:     Head: Normocephalic and atraumatic.     Mouth/Throat:     Comments: Dry mucous membranes Eyes:     General:        Right eye: No discharge.          Left eye: No discharge.     Conjunctiva/sclera: Conjunctivae normal.  Neck:     Musculoskeletal: Neck supple.  Cardiovascular:     Rate and Rhythm: Regular rhythm. Tachycardia present.  Pulmonary:     Effort: Pulmonary effort is normal. No respiratory distress.     Breath sounds: Normal breath sounds. No wheezing, rhonchi or rales.  Chest:     Comments: L anterior and posterior chest wall w/ TTP. No overlying rash. No palpable crepitus. Patient states this reproduces the chest/back pain she is intermittently having.  Abdominal:     General: A surgical scar is present. Bowel sounds are decreased.     Tenderness: There is generalized abdominal tenderness (most significant in periumbilical area).     Comments: Upper abdomen mildly tympanic to percussion.   Skin:    General: Skin is warm and dry.     Findings: No rash.  Neurological:     Mental Status: She is alert.     Comments: Clear speech.   Psychiatric:        Behavior: Behavior normal.    ED Treatments / Results  Labs (all labs ordered are listed, but only abnormal results are displayed) Labs Reviewed  CBC WITH DIFFERENTIAL/PLATELET - Abnormal; Notable for the following components:      Result Value   WBC 13.3 (*)    RBC 5.90 (*)    Hemoglobin 16.2 (*)    HCT  48.2 (*)    Neutro Abs 11.3 (*)    All other components within normal limits  COMPREHENSIVE METABOLIC PANEL - Abnormal; Notable for the following components:   Sodium 134 (*)    CO2 19 (*)    Glucose, Bld 429 (*)    Creatinine, Ser 1.09 (*)    GFR calc non Af Amer 52 (*)    All other components within normal limits  CBG MONITORING, ED - Abnormal; Notable for the following components:   Glucose-Capillary 403 (*)    All other components within normal limits  SARS CORONAVIRUS 2 (HOSPITAL ORDER, [facility])  LIPASE, BLOOD  TROPONIN I  URINALYSIS, ROUTINE W REFLEX MICROSCOPIC    EKG EKG Interpretation  Date/Time:  Saturday Dec 15 2018 14:59:20 EDT Ventricular Rate:  116 PR Interval:    QRS Duration: 87 QT Interval:  325 QTC Calculation: 452 R Axis:   -58 Text Interpretation:  Sinus tachycardia rate has increased since last tracing of May 15, 2018 Confirmed by Pattricia Boss [phone]) on 12/15/2018 6:33:00 PM   Radiology Ct Abdomen Pelvis W Contrast  Result Date: 12/15/2018 CLINICAL DATA:  Left-sided abdominal pain for several months EXAM: CT ABDOMEN AND PELVIS WITH CONTRAST TECHNIQUE: Multidetector CT imaging of the abdomen and pelvis was performed using the standard protocol following bolus administration of intravenous contrast. CONTRAST:  137mL OMNIPAQUE 300 COMPARISON:  01/08/2018 FINDINGS: Lower chest: No acute abnormality. Hepatobiliary: No focal liver abnormality is seen. Status post cholecystectomy. No biliary dilatation. Pancreas: Unremarkable. No pancreatic ductal dilatation or surrounding inflammatory changes. Spleen: Normal in size without focal abnormality. Adrenals/Urinary Tract: Adrenal glands are within normal limits. Kidneys demonstrate a normal enhancement pattern bilaterally. No renal calculi or obstructive changes are seen. Delayed images demonstrate normal excretion of contrast material. Bladder is well distended. Stomach/Bowel: Mild diverticular change of the colon is noted. No diverticulitis is seen. Postsurgical changes are noted in the ascending colon. The anastomosis appears patent with decompressed ileum proximal to the anastomosis. Several loops proximal to the anastomosis however there is a focal abrupt caliber change with more proximal small bowel dilatation consistent with at least partial small bowel obstruction. The appearance and location suggests underlying adhesions. The stomach is within normal limits. Vascular/Lymphatic: Aortic atherosclerosis. No enlarged abdominal or pelvic lymph nodes. Reproductive: Uterus and bilateral adnexa are unremarkable. Other: No abdominal wall hernia or  abnormality. No abdominopelvic ascites. Musculoskeletal: Degenerative changes of the lumbar spine are noted. Postsurgical changes are seen from L2-L4. IMPRESSION: Postsurgical changes in the right mid abdomen with changes consistent with adhesions and at least partial distal small bowel obstruction as described. Chronic changes without acute abnormality. Electronically Signed   By: Inez Catalina M.D.   On: 12/15/2018 17:15   Dg Chest Portable 1 View  Result Date: 12/15/2018 CLINICAL DATA:  Acute chest pain. EXAM: PORTABLE CHEST 1 VIEW COMPARISON:  05/15/2018 and prior radiographs FINDINGS: The cardiomediastinal silhouette is unremarkable. There is no evidence of focal airspace disease, pulmonary edema, suspicious pulmonary nodule/mass, pleural effusion, or pneumothorax. No acute bony abnormalities are identified. LOWER cervical spine fusion hardware again noted. IMPRESSION: No active disease. Electronically Signed   By: Margarette Canada M.D.   On: 12/15/2018 15:22    Procedures Procedures (including critical care time)  Medications Ordered in ED Medications  sodium chloride 0.9 % bolus 1,000 mL (0 mLs Intravenous Stopped 12/15/18 1623)  ondansetron (ZOFRAN) injection 4 mg (4 mg Intravenous Given 12/15/18 1523)  fentaNYL (SUBLIMAZE) injection 50  mcg (50 mcg Intravenous Given 12/15/18 1524)  iohexol (OMNIPAQUE) 300 MG/ML solution 100 mL (100 mLs Intravenous Contrast Given 12/15/18 1631)    Initial Impression / Assessment and Plan / ED Course  I have reviewed the triage vital signs and the nursing notes.  Pertinent labs & imaging results that were available during my care of the patient were reviewed by me and considered in my medical decision making (see chart for details).   Patient presents to the ED with complaints of abdominal pain w/ N/V & constipation. Has had poor PO intake w/ generalized weakness & episodes of lightheadedness this AM. She also mentions some L chest/upper back pain. Appears  uncomfortable, tachycardic & hypertensive, afebrile. Her chest/back pain appears to be reproducible w/ chest wall palpation- high suspicion for MSK- will obtain CXR, EKG, & troponin, she has been compliant w/ xarelto therefore feel PE is less likely. Lightheadedness & generalized weakness likely secondary to dehydration.  Abdomen w/ decreased bowel sounds, upper abdomen w/ tympanic sounds w/ percussion, generalized tenderness. Suspect SBO- also considering diverticulitis, colitis, perforation. Plan for labs & CT abdomen/pelvis. Will administer analgesics, anti-emetics, and fluids.   CBC: Leukocytosis @ 13.3. Hgb/hct elevated- suspect degree of hemoconcentration w/ dehydration.  CMP: Mild electrolyte abnormalities as above. Hyperglycemia.  Lipase: WNL CXR: Negative for acute abnormality.  Trop: WNL EKG: No STEMI, tachy  CT abdomen/pelvis w/ at least partial distal small bowel obstruction.  NG tube General surgery consultation.   15:25: RE-EVAL: Patient feeling somewhat improved, remains with mild discomfort. Aware of results & plan thus far.   17:31: CONSULT: Discussed w/ general surgeon Dr. Brantley Stage- in agreement w/ NG tube, will consult, admission to hospitalist service.   17:44: CONSULT: Discussed w/ [facility] hospitalist service, Dr. Marianne Sofia- accepts admission.   Final Clinical Impressions(s) / ED Diagnoses   Final diagnoses:  SBO (small bowel obstruction) [facility])  Dehydration  Hyperglycemia    ED Discharge Orders    None       Amaryllis Dyke, PA-C 12/15/18 1842    Pattricia Boss, MD 12/16/18 1115

## 2018-12-15 NOTE — ED Notes (Signed)
Pt back from CT

## 2018-12-15 NOTE — ED Notes (Signed)
Portable Xray at bedside.

## 2018-12-15 NOTE — Progress Notes (Signed)
PT refused 10 units of Lantus. Pt only wanted 10 units instead of 20. Wasted other 10 units with Kathleene Hazel.   Eleanora Neighbor, RN

## 2018-12-15 NOTE — Progress Notes (Signed)
Results from KUB stated to advance NG tube 5 cm further for optimal use. This RN and Chartered certified accountant measured and advanced NG tube 5 more cm. Pt tolerated well. Messaged NP on call regarding another KUB to confirm placement. Awaiting response.   Eleanora Neighbor, RN

## 2018-12-15 NOTE — Progress Notes (Signed)
Pt only wants to take 10 units of Lantus instead of 20 units. Pt also asking for something to sleep. Pt is strict NPO. Messaged NP on call.   Eleanora Neighbor, RN

## 2018-12-15 NOTE — Consult Note (Signed)
Reason for Consult: Partial small bowel obstruction Referring Physician: Dr. Marnee Guarneri is an 68 y.o. female.  HPI: Asked to see patient at the request of Dr. Reece Levy for a 1 day history of left lower quadrant abdominal pain, nausea, vomiting.  Patient is a history of multiple abdominal surgeries and developed symptoms about a day ago.  Started with crampy diffuse abdominal pain which is now in her left lower quadrant.  This was accompanied by nausea and vomiting multiple times over the last 24 hours.  CT scan shows a partial small bowel obstruction.  She is currently comfortable but still quite bloated.  There is no blood in her stool and her last bowel movement was yesterday.  Past Medical History:  Diagnosis Date  . Anemia    iron deficiency  . Arthritis   . Blood transfusion   . Cervical spondylosis with myelopathy and radiculopathy   . Depression   . Diabetes mellitus    Type II  . Diastolic dysfunction   . Diverticulitis   . DJD (degenerative joint disease)   . DVT (deep venous thrombosis) (HCC)    times 2 lower leg  . Endometriosis   . Family history of adverse reaction to anesthesia    mom and dad PONV  . Fibromyalgia   . GERD (gastroesophageal reflux disease)    occ  . Hiatal hernia   . History of kidney stones   . Hyperlipidemia   . Hypertension   . Hypothyroidism   . Insomnia   . Mild aortic insufficiency    by echo 04/2013  . Osteoarthritis    back and knee  . Ovarian cyst   . PONV (postoperative nausea and vomiting)   . RLS (restless legs syndrome)   . Thoracic compression fracture ([place])   . Uterine fibroid   . UTI (lower urinary tract infection)   . Villous adenoma of right colon 04/08/2016  . Vitamin D deficiency disease     Past Surgical History:  Procedure Laterality Date  . ANTERIOR CERVICAL DECOMP/DISCECTOMY FUSION N/A 03/23/2017   Procedure: ANTERIOR CERVICAL DECOMPRESSION/DISCECTOMY FUSION, INTERBODY PROSTHESIS,PLATE CERVICAL THREE- CERVICAL  FOUR, CERVICAL FOUR- CERVICAL FIVE, CERVICAL FIVE- CERVICAL SIX;  Surgeon: Newman Pies, MD;  Location: [facility];  Service: Neurosurgery;  Laterality: N/A;  . APPENDECTOMY    . BACK SURGERY  ,2005   April  2013 - [facility]  . BREAST SURGERY     breast reduction  . CARDIAC CATHETERIZATION  04/2013   normal coronary arteries and normal LVF  . CARPAL TUNNEL RELEASE  06/05/2012   Procedure: CARPAL TUNNEL RELEASE;  Surgeon: Cammie Sickle., MD;  Location: [facility];  Service: Orthopedics;  Laterality: Left;  . CESAREAN SECTION     x 2  . CHOLECYSTECTOMY    . COLECTOMY  04/08/2016  . DILATION AND CURETTAGE OF UTERUS    . DORSAL COMPARTMENT RELEASE  06/05/2012   Procedure: RELEASE DORSAL COMPARTMENT (DEQUERVAIN);  Surgeon: Cammie Sickle., MD;  Location: [facility];  Service: Orthopedics;  Laterality: Left;  Excision of mixoid cyst also  . EYE SURGERY     cataracts bilateral  . JOINT REPLACEMENT     Bilateral knee  . KNEE ARTHROPLASTY  09   lft partial  . KNEE ARTHROPLASTY     rt  . LAPAROSCOPIC PARTIAL COLECTOMY N/A 04/08/2016   Procedure: LAPAROSCOPIC ASSISTED ASCENDING COLECTOMY POSSIBLE OPEN COLECTOMY;  Surgeon: Fanny Skates, MD;  Location: [facility]  OR;  Service: General;  Laterality: N/A;  . orthopedic surgeries     multiple  . REDUCTION MAMMAPLASTY Bilateral   . SHOULDER OPEN ROTATOR CUFF REPAIR     rt and lft  . TEE WITHOUT CARDIOVERSION N/A 11/23/2017   Procedure: TRANSESOPHAGEAL ECHOCARDIOGRAM (TEE);  Surgeon: Jerline Pain, MD;  Location: [facility] ENDOSCOPY;  Service: Cardiovascular;  Laterality: N/A;  . TUBAL LIGATION     btsp    Family History  Problem Relation Age of Onset  . Heart attack Father        31s  . Hypothyroidism Father   . Diabetes type II Father   . Diabetes type II Mother   . Hypertension Mother   . Breast cancer Mother 67  . Hypothyroidism Brother   . Breast cancer Maternal Aunt   . Breast cancer Cousin   .  Breast cancer Maternal Aunt   . Breast cancer Cousin     Social History:  reports that she has never smoked. She has never used smokeless tobacco. She reports that she does not drink alcohol or use drugs.  Allergies:  Allergies  Allergen Reactions  . Codeine Nausea And Vomiting  . Ambien [Zolpidem Tartrate] Other (See Comments)    Up sleep walking and eating  . Crestor [Rosuvastatin] Other (See Comments)    Muscle weakness, cramps and aching all over body  . Lipitor [Atorvastatin] Itching and Other (See Comments)    Muscle weakness, cramps and aches all over body  . Morphine And Related Other (See Comments)    Flushing and feeling hot Tolerates with Diphenhydramine  . Penicillins Rash and Other (See Comments)    Caused Headaches also Has patient had a PCN reaction causing immediate rash, facial/tongue/throat swelling, SOB or lightheadedness with hypotension: No Has patient had a PCN reaction causing severe rash involving mucus membranes or skin necrosis: No Has patient had a PCN reaction that required hospitalization No Has patient had a PCN reaction occurring within the last 10 years: No If all of the above answers are "NO", then may proceed with Cephalosporin use.    . Statins Other (See Comments)    Muscle weakness, cramps and aching all over body  . Dilaudid [Hydromorphone Hcl] Other (See Comments)    Hallucinations/ argumentative, goes beserk  . Heparin Other (See Comments)    HIT ab positive  . Amitriptyline Other (See Comments)    Loopy feeling  . Ceftin [Cefuroxime Axetil] Other (See Comments)    Stomach pain   . Lovaza [Omega-3-Acid Ethyl Esters] Other (See Comments)    Stomach pain   . Lunesta [Eszopiclone] Other (See Comments)    Causes dizziness  . Lyrica [Pregabalin] Nausea Only  . Metformin And Related Nausea Only    Medications: I have reviewed the patient's current medications.  Results for orders placed or performed during the hospital encounter of  12/15/18 (from the past 48 hour(s))  POC CBG, ED     Status: Abnormal   Collection Time: 12/15/18  3:20 PM  Result Value Ref Range   Glucose-Capillary 403 (H) 70 - 99 mg/dL   Comment 1 Notify RN    Comment 2 Document in Chart   CBC with Differential     Status: Abnormal   Collection Time: 12/15/18  3:21 PM  Result Value Ref Range   WBC 13.3 (H) 4.0 - 10.5 K/uL   RBC 5.90 (H) 3.87 - 5.11 MIL/uL   Hemoglobin 16.2 (H) 12.0 - 15.0 g/dL   HCT 48.2 (  H) 36.0 - 46.0 %   MCV 81.7 80.0 - 100.0 fL   MCH 27.5 26.0 - 34.0 pg   MCHC 33.6 30.0 - 36.0 g/dL   RDW 12.6 11.5 - 15.5 %   Platelets 346 150 - 400 K/uL   nRBC 0.0 0.0 - 0.2 %   Neutrophils Relative % 86 %   Neutro Abs 11.3 (H) 1.7 - 7.7 K/uL   Lymphocytes Relative 11 %   Lymphs Abs 1.5 0.7 - 4.0 K/uL   Monocytes Relative 3 %   Monocytes Absolute 0.4 0.1 - 1.0 K/uL   Eosinophils Relative 0 %   Eosinophils Absolute 0.0 0.0 - 0.5 K/uL   Basophils Relative 0 %   Basophils Absolute 0.1 0.0 - 0.1 K/uL   Immature Granulocytes 0 %   Abs Immature Granulocytes 0.04 0.00 - 0.07 K/uL    Comment: Performed at [facility] [address], [place], [place] [address]  Comprehensive metabolic panel     Status: Abnormal   Collection Time: 12/15/18  3:21 PM  Result Value Ref Range   Sodium 134 (L) 135 - 145 mmol/L   Potassium 4.4 3.5 - 5.1 mmol/L   Chloride 100 98 - 111 mmol/L   CO2 19 (L) 22 - 32 mmol/L   Glucose, Bld 429 (H) 70 - 99 mg/dL   BUN 13 8 - 23 mg/dL   Creatinine, Ser 1.09 (H) 0.44 - 1.00 mg/dL   Calcium 9.5 8.9 - 10.3 mg/dL   Total Protein 7.1 6.5 - 8.1 g/dL   Albumin 3.9 3.5 - 5.0 g/dL   AST 15 15 - 41 U/L   ALT 14 0 - 44 U/L   Alkaline Phosphatase 122 38 - 126 U/L   Total Bilirubin 1.1 0.3 - 1.2 mg/dL   GFR calc non Af Amer 52 (L) >60 mL/min   GFR calc Af Amer >60 >60 mL/min   Anion gap 15 5 - 15    Comment: Performed at [facility] [address], [place], [place] [address]  Lipase, blood     Status: None    Collection Time: 12/15/18  3:21 PM  Result Value Ref Range   Lipase 22 11 - 51 U/L    Comment: Performed at [facility] [address], [place], [place] [address]  Troponin I - ONCE - STAT     Status: None   Collection Time: 12/15/18  3:21 PM  Result Value Ref Range   Troponin I <0.03 <0.03 ng/mL    Comment: Performed at [facility], [place] [address], [place], [place] [address]  SARS Coronavirus 2 (CEPHEID - Performed in [facility] hospital lab), Hosp Order     Status: None   Collection Time: 12/15/18  3:21 PM  Result Value Ref Range   SARS Coronavirus 2 NEGATIVE NEGATIVE    Comment: (NOTE) If result is NEGATIVE SARS-CoV-2 target nucleic acids are NOT DETECTED. The SARS-CoV-2 RNA is generally detectable in upper and lower  respiratory specimens during the acute phase of infection. The lowest  concentration of SARS-CoV-2 viral copies this assay can detect is 250  copies / mL. A negative result does not preclude SARS-CoV-2 infection  and should not be used as the sole basis for treatment or other  patient management decisions.  A negative result may occur with  improper specimen collection / handling, submission of specimen other  than nasopharyngeal swab, presence of viral mutation(s) within the  areas targeted by  this assay, and inadequate number of viral copies  (<250 copies / mL). A negative result must be combined with clinical  observations, patient history, and epidemiological information. If result is POSITIVE SARS-CoV-2 target nucleic acids are DETECTED. The SARS-CoV-2 RNA is generally detectable in upper and lower  respiratory specimens dur ing the acute phase of infection.  Positive  results are indicative of active infection with SARS-CoV-2.  Clinical  correlation with patient history and other diagnostic information is  necessary to determine patient infection status.  Positive results do  not rule out bacterial infection or co-infection with other  viruses. If result is PRESUMPTIVE POSTIVE SARS-CoV-2 nucleic acids MAY BE PRESENT.   A presumptive positive result was obtained on the submitted specimen  and confirmed on repeat testing.  While 2019 novel coronavirus  (SARS-CoV-2) nucleic acids may be present in the submitted sample  additional confirmatory testing may be necessary for epidemiological  and / or clinical management purposes  to differentiate between  SARS-CoV-2 and other Sarbecovirus currently known to infect humans.  If clinically indicated additional testing with an alternate test  methodology [id number]) is advised. The SARS-CoV-2 RNA is generally  detectable in upper and lower respiratory sp ecimens during the acute  phase of infection. The expected result is Negative. Fact Sheet for Patients:  StrictlyIdeas.no Fact Sheet for Healthcare Providers: BankingDealers.co.za This test is not yet approved or cleared by the [place] FDA and has been authorized for detection and/or diagnosis of SARS-CoV-2 by FDA under an Emergency Use Authorization (EUA).  This EUA will remain in effect (meaning this test can be used) for the duration of the COVID-19 declaration under Section 564(b)(1) of the Act, 21 U.S.C. section 360bbb-3(b)(1), unless the authorization is terminated or revoked sooner. Performed at [facility], [place] [address], [place], [place] [address]   Urinalysis, Routine w reflex microscopic     Status: Abnormal   Collection Time: 12/15/18  5:16 PM  Result Value Ref Range   Color, Urine YELLOW YELLOW   APPearance CLEAR CLEAR   Specific Gravity, Urine 1.040 (H) 1.005 - 1.030   pH 5.0 5.0 - 8.0   Glucose, UA >=500 (A) NEGATIVE mg/dL   Hgb urine dipstick SMALL (A) NEGATIVE   Bilirubin Urine NEGATIVE NEGATIVE   Ketones, ur 20 (A) NEGATIVE mg/dL   Protein, ur NEGATIVE NEGATIVE mg/dL   Nitrite NEGATIVE NEGATIVE   Leukocytes,Ua NEGATIVE NEGATIVE   Bacteria, UA  NONE SEEN NONE SEEN   Squamous Epithelial / LPF 0-5 0 - 5    Comment: Performed at [facility], [place] [address] [place], [place] [address]    Ct Abdomen Pelvis W Contrast  Result Date: 12/15/2018 CLINICAL DATA:  Left-sided abdominal pain for several months EXAM: CT ABDOMEN AND PELVIS WITH CONTRAST TECHNIQUE: Multidetector CT imaging of the abdomen and pelvis was performed using the standard protocol following bolus administration of intravenous contrast. CONTRAST:  119mL OMNIPAQUE 300 COMPARISON:  01/08/2018 FINDINGS: Lower chest: No acute abnormality. Hepatobiliary: No focal liver abnormality is seen. Status post cholecystectomy. No biliary dilatation. Pancreas: Unremarkable. No pancreatic ductal dilatation or surrounding inflammatory changes. Spleen: Normal in size without focal abnormality. Adrenals/Urinary Tract: Adrenal glands are within normal limits. Kidneys demonstrate a normal enhancement pattern bilaterally. No renal calculi or obstructive changes are seen. Delayed images demonstrate normal excretion of contrast material. Bladder is well distended. Stomach/Bowel: Mild diverticular change of the colon is noted. No diverticulitis is seen. Postsurgical changes are noted in the ascending colon.  The anastomosis appears patent with decompressed ileum proximal to the anastomosis. Several loops proximal to the anastomosis however there is a focal abrupt caliber change with more proximal small bowel dilatation consistent with at least partial small bowel obstruction. The appearance and location suggests underlying adhesions. The stomach is within normal limits. Vascular/Lymphatic: Aortic atherosclerosis. No enlarged abdominal or pelvic lymph nodes. Reproductive: Uterus and bilateral adnexa are unremarkable. Other: No abdominal wall hernia or abnormality. No abdominopelvic ascites. Musculoskeletal: Degenerative changes of the lumbar spine are noted. Postsurgical changes are seen from L2-L4. IMPRESSION:  Postsurgical changes in the right mid abdomen with changes consistent with adhesions and at least partial distal small bowel obstruction as described. Chronic changes without acute abnormality. Electronically Signed   By: Inez Catalina M.D.   On: 12/15/2018 17:15   Dg Chest Portable 1 View  Result Date: 12/15/2018 CLINICAL DATA:  Acute chest pain. EXAM: PORTABLE CHEST 1 VIEW COMPARISON:  05/15/2018 and prior radiographs FINDINGS: The cardiomediastinal silhouette is unremarkable. There is no evidence of focal airspace disease, pulmonary edema, suspicious pulmonary nodule/mass, pleural effusion, or pneumothorax. No acute bony abnormalities are identified. LOWER cervical spine fusion hardware again noted. IMPRESSION: No active disease. Electronically Signed   By: Margarette Canada M.D.   On: 12/15/2018 15:22    Review of Systems  Gastrointestinal: Positive for abdominal pain, nausea and vomiting.  All other systems reviewed and are negative.  Blood pressure (!) 145/81, pulse 98, temperature 98.3 F (36.8 C), temperature source Oral, resp. rate 20, weight 88.4 kg, SpO2 94 %. Physical Exam  Constitutional: She is oriented to person, place, and time. She appears well-developed and well-nourished.  HENT:  Head: Normocephalic and atraumatic.  Eyes: Pupils are equal, round, and reactive to light. EOM are normal.  Neck: Normal range of motion. Neck supple.  Cardiovascular: Normal rate and regular rhythm.  Respiratory: Effort normal and breath sounds normal.  GI: Soft. She exhibits distension. There is abdominal tenderness. There is no rebound and no guarding.  Musculoskeletal: Normal range of motion.  Neurological: She is alert and oriented to person, place, and time.  Skin: Skin is warm and dry.  Psychiatric: She has a normal mood and affect.    Assessment/Plan: Partial small bowel obstruction  Small bowel protocol  NG tube decompression  IV fluids  Medical service to admit due to multiple  comorbidities.  No acute surgical need will follow closely  [place] 12/15/2018, 6:22 PM

## 2018-12-15 NOTE — Progress Notes (Signed)
This RN and Chartered certified accountant advanced NG tube and will obtain another image per NP on call. Pt tolerated well.  Eleanora Neighbor, RN

## 2018-12-15 NOTE — ED Triage Notes (Signed)
Pt in with hyperglycemia (CBG 466 PTA), bile emesis since last night and weakness per son. Hx of DKA, denies any fever, cough or sob

## 2018-12-15 NOTE — ED Notes (Signed)
Patient transported to CT 

## 2018-12-15 NOTE — Progress Notes (Signed)
ANTICOAGULATION CONSULT NOTE - Initial Consult  Pharmacy Consult for Lovenox  Indication: VTE treatment;  On Xarelto PTA for h/o lower leg DVT x2  Allergies  Allergen Reactions  . Codeine Nausea And Vomiting  . Ambien [Zolpidem Tartrate] Other (See Comments)    Up sleep walking and eating  . Crestor [Rosuvastatin] Other (See Comments)    Muscle weakness, cramps and aching all over body  . Lipitor [Atorvastatin] Itching and Other (See Comments)    Muscle weakness, cramps and aches all over body  . Morphine And Related Other (See Comments)    Flushing and feeling hot Tolerates with Diphenhydramine  . Penicillins Rash and Other (See Comments)    Caused Headaches also Has patient had a PCN reaction causing immediate rash, facial/tongue/throat swelling, SOB or lightheadedness with hypotension: No Has patient had a PCN reaction causing severe rash involving mucus membranes or skin necrosis: No Has patient had a PCN reaction that required hospitalization No Has patient had a PCN reaction occurring within the last 10 years: No If all of the above answers are "NO", then may proceed with Cephalosporin use.    . Statins Other (See Comments)    Muscle weakness, cramps and aching all over body  . Dilaudid [Hydromorphone Hcl] Other (See Comments)    Hallucinations/ argumentative, goes beserk  . Heparin Other (See Comments)    HIT ab positive  (SRA negative 11/25/17)   . Amitriptyline Other (See Comments)    Loopy feeling  . Ceftin [Cefuroxime Axetil] Other (See Comments)    Stomach pain   . Lovaza [Omega-3-Acid Ethyl Esters] Other (See Comments)    Stomach pain   . Lunesta [Eszopiclone] Other (See Comments)    Causes dizziness  . Lyrica [Pregabalin] Nausea Only  . Metformin And Related Nausea Only    Patient Measurements: Height: 5\' 3"  (160 cm)(from 05/15/2018 records) Weight: 194 lb 14.2 oz (88.4 kg) IBW/kg (Calculated) : 52.4   Vital Signs:Temp: 98.4 F (36.9 C) (05/09  1900) Temp Source: Oral (05/09 1900) BP: 136/70 (05/09 1900) Pulse Rate: 94 (05/09 1900)  Labs: Recent Labs    12/15/18 1521  HGB 16.2*  HCT 48.2*  PLT 346  CREATININE 1.09*  TROPONINI <0.03    Estimated Creatinine Clearance: 52.1 mL/min (A) (by C-G formula based on SCr of 1.09 mg/dL (H)).   Medical History: Past Medical History:  Diagnosis Date  . Anemia    iron deficiency  . Arthritis   . Blood transfusion   . Cervical spondylosis with myelopathy and radiculopathy   . Depression   . Diabetes mellitus    Type II  . Diastolic dysfunction   . Diverticulitis   . DJD (degenerative joint disease)   . DVT (deep venous thrombosis) (HCC)    times 2 lower leg  . Endometriosis   . Family history of adverse reaction to anesthesia    mom and dad PONV  . Fibromyalgia   . GERD (gastroesophageal reflux disease)    occ  . Hiatal hernia   . History of kidney stones   . Hyperlipidemia   . Hypertension   . Hypothyroidism   . Insomnia   . Mild aortic insufficiency    by echo 04/2013  . Osteoarthritis    back and knee  . Ovarian cyst   . PONV (postoperative nausea and vomiting)   . RLS (restless legs syndrome)   . Thoracic compression fracture ([place])   . Uterine fibroid   . UTI (lower urinary tract infection)   .  Villous adenoma of right colon 04/08/2016  . Vitamin D deficiency disease     Medications:  Medications Prior to Admission  Medication Sig Dispense Refill Last Dose  . acetaminophen (TYLENOL) 500 MG tablet Take 1,000 mg by mouth every 8 (eight) hours as needed for mild pain or headache.   12/14/2018 at Unknown time  . amLODipine (NORVASC) 2.5 MG tablet Take 2.5 mg by mouth daily.   12/14/2018 at am  . aspirin EC 81 MG tablet Take 81 mg by mouth daily.   12/14/2018 at am  . clobetasol (TEMOVATE) 0.05 % external solution Apply 1 application topically See admin instructions. Apply twice daily to scalp and ear as needed for itching/rash (do not apply to face/groin/underarms)   3 unknown  . Cyanocobalamin (B-12) 2500 MCG TABS Take 2,500 mcg by mouth daily.   12/14/2018 at am  . cyclobenzaprine (FLEXERIL) 10 MG tablet Take 1 tablet (10 mg total) by mouth 3 (three) times daily as needed for muscle spasms. 30 tablet 0 12/14/2018 at Unknown time  . dexlansoprazole (DEXILANT) 60 MG capsule Take 60 mg by mouth daily.   12/14/2018 at am  . docusate sodium (COLACE) 100 MG capsule Take 1 capsule (100 mg total) by mouth 2 (two) times daily. 60 capsule 0 12/14/2018 at pm  . DULoxetine (CYMBALTA) 60 MG capsule Take 60 mg by mouth daily.    12/14/2018 at am  . ferrous sulfate 325 (65 FE) MG tablet Take 1 tablet (325 mg total) by mouth 2 (two) times daily with a meal. 60 tablet 3 12/14/2018 at pm  . fluticasone (FLONASE) 50 MCG/ACT nasal spray Place 2 sprays into both nostrils daily as needed for allergies or rhinitis.   12/14/2018 at Unknown time  . furosemide (LASIX) 20 MG tablet Take 20 mg by mouth daily as needed for fluid or edema.    few days ago  . gabapentin (NEURONTIN) 300 MG capsule Take 600 mg by mouth at bedtime.   1 12/14/2018 at pm  . ibuprofen (ADVIL) 200 MG tablet Take 400 mg by mouth every 6 (six) hours as needed for headache (pain).   few days ago  . insulin degludec (TRESIBA FLEXTOUCH) 100 UNIT/ML SOPN FlexTouch Pen Inject 40 Units into the skin daily.   12/15/2018 at 1400  . insulin lispro (HUMALOG KWIKPEN) 100 UNIT/ML KwikPen Inject 14 Units into the skin 3 (three) times daily.   12/15/2018 at 1400  . irbesartan (AVAPRO) 75 MG tablet Take 75 mg by mouth daily.   12/14/2018 at am  . levothyroxine (SYNTHROID, LEVOTHROID) 150 MCG tablet Take 150 mcg by mouth daily before breakfast.  5 12/14/2018 at am  . LORazepam (ATIVAN) 0.5 MG tablet Take 1 tablet (0.5 mg total) by mouth every 8 (eight) hours as needed for anxiety. 30 tablet 0 2-3 months ago  . meclizine (ANTIVERT) 12.5 MG tablet Take 25 mg by mouth 3 (three) times daily as needed for dizziness.   12/15/2018 at afternoon  . Pitavastatin  Calcium (LIVALO) 2 MG TABS Take 2 mg by mouth every Wednesday.   4/29 or 5/6  . Probiotic Product (PROBIOTIC PO) Take 1 capsule by mouth daily.   few days ago at am  . promethazine (PHENERGAN) 25 MG tablet Take 25 mg by mouth every 8 (eight) hours as needed for nausea or vomiting.   12/15/2018 at afternoon  . rivaroxaban (XARELTO) 20 MG TABS tablet Take 20 mg by mouth daily.   12/14/2018 at 8:30-10am  . temazepam (RESTORIL) 15  MG capsule Take 15 mg by mouth at bedtime.   4 12/14/2018 at pm  . insulin aspart protamine- aspart (NOVOLOG MIX 70/30) (70-30) 100 UNIT/ML injection Inject 0.38 mLs (38 Units total) into the skin 2 (two) times daily with a meal. (Patient not taking: Reported on 12/15/2018) 10 mL 11 Not Taking at Unknown time  . oxyCODONE (OXY IR/ROXICODONE) 5 MG immediate release tablet Take 1 tablet (5 mg total) by mouth every 4 (four) hours as needed for moderate pain ((score 4 to 6)). (Patient not taking: Reported on 12/15/2018) 30 tablet 0 Not Taking at Unknown time  . oxyCODONE-acetaminophen (PERCOCET/ROXICET) 5-325 MG tablet Take 1 tablet by mouth every 6 (six) hours as needed (pain).       Scheduled:  . diatrizoate meglumine-sodium  90 mL Per NG tube Once  . insulin aspart  0-15 Units Subcutaneous Q4H  . insulin glargine  20 Units Subcutaneous Q2200    Assessment: 68 y.o female patient who was on Xarelto PTA for h/o lower leg DVT x2,  H/o HIT Ab positive 11/23/2017  ( SRA negative 11/25/17)  pt to ED 5/9 with LLQ abd pain, N/V.  CT scan revealed SBO. Plan NG tube decompression, IVFs.  No surgery needed at this time.   Wt 88 kg,  crcl 52 ml/min pltc 346k, H/H 16/2/48.2  Pta Xarelto 20mg  daily , LD taken 5/8 @  0830-10 AM.   Goal of Therapy:  Monitor platelets by anticoagulation protocol: Yes   Plan:  Lovenox 90 mg SQ q12h  F/u for restart of PTA Xarelto   Nicole Cella, RPh Clinical Pharmacist Please check AMION for all [facility] phone numbers After 10:00 PM, call [place]  [phone] 12/15/2018,8:02 PM

## 2018-12-15 NOTE — Progress Notes (Signed)
Day shift RN was informed by radiology to ask MD if NG tube needs to be advanced. Messaged MD on call. Awaiting response.    Eleanora Neighbor, RN

## 2018-12-16 ENCOUNTER — Inpatient Hospital Stay (HOSPITAL_COMMUNITY): Payer: Medicare Other

## 2018-12-16 DIAGNOSIS — K5651 Intestinal adhesions [bands], with partial obstruction: Principal | ICD-10-CM

## 2018-12-16 LAB — COMPREHENSIVE METABOLIC PANEL
ALT: 12 U/L (ref 0–44)
AST: 12 U/L — ABNORMAL LOW (ref 15–41)
Albumin: 3.6 g/dL (ref 3.5–5.0)
Alkaline Phosphatase: 114 U/L (ref 38–126)
Anion gap: 13 (ref 5–15)
BUN: 14 mg/dL (ref 8–23)
CO2: 24 mmol/L (ref 22–32)
Calcium: 9.1 mg/dL (ref 8.9–10.3)
Chloride: 103 mmol/L (ref 98–111)
Creatinine, Ser: 0.91 mg/dL (ref 0.44–1.00)
GFR calc Af Amer: >60 mL/min (ref >60)
GFR calc non Af Amer: >60 mL/min (ref >60)
Glucose, Bld: 145 mg/dL — ABNORMAL HIGH (ref 70–99)
Potassium: 4.2 mmol/L (ref 3.5–5.1)
Sodium: 140 mmol/L (ref 135–145)
Total Bilirubin: 0.8 mg/dL (ref 0.3–1.2)
Total Protein: 6.5 g/dL (ref 6.5–8.1)

## 2018-12-16 LAB — CBC
HCT: 44.2 % (ref 36.0–46.0)
Hemoglobin: 14.9 g/dL (ref 12.0–15.0)
MCH: 28.1 pg (ref 26.0–34.0)
MCHC: 33.7 g/dL (ref 30.0–36.0)
MCV: 83.4 fL (ref 80.0–100.0)
Platelets: 320 10*3/uL (ref 150–400)
RBC: 5.3 MIL/uL — ABNORMAL HIGH (ref 3.87–5.11)
RDW: 13.2 % (ref 11.5–15.5)
WBC: 11.4 10*3/uL — ABNORMAL HIGH (ref 4.0–10.5)
nRBC: 0 % (ref 0.0–0.2)

## 2018-12-16 LAB — MAGNESIUM: Magnesium: 1.9 mg/dL (ref 1.7–2.4)

## 2018-12-16 LAB — GLUCOSE, CAPILLARY
Glucose-Capillary: 107 mg/dL — ABNORMAL HIGH (ref 70–99)
Glucose-Capillary: 114 mg/dL — ABNORMAL HIGH (ref 70–99)
Glucose-Capillary: 130 mg/dL — ABNORMAL HIGH (ref 70–99)
Glucose-Capillary: 137 mg/dL — ABNORMAL HIGH (ref 70–99)
Glucose-Capillary: 151 mg/dL — ABNORMAL HIGH (ref 70–99)
Glucose-Capillary: 183 mg/dL — ABNORMAL HIGH (ref 70–99)

## 2018-12-16 LAB — PHOSPHORUS: Phosphorus: 4.5 mg/dL (ref 2.5–4.6)

## 2018-12-16 MED ORDER — MORPHINE SULFATE (PF) 4 MG/ML IV SOLN
4.0000 mg | Freq: Once | INTRAVENOUS | Status: AC
  Administered 2018-12-16: 4 mg via INTRAVENOUS
  Filled 2018-12-16: qty 1

## 2018-12-16 MED ORDER — HYDRALAZINE HCL 20 MG/ML IJ SOLN
10.0000 mg | Freq: Four times a day (QID) | INTRAMUSCULAR | Status: DC | PRN
  Administered 2018-12-16: 10 mg via INTRAVENOUS
  Filled 2018-12-16: qty 1

## 2018-12-16 NOTE — Progress Notes (Signed)
Pt. blood pressure is 196/102. Complained of severe pain given PRN morphine. MD Pokhrel made aware.

## 2018-12-16 NOTE — Progress Notes (Addendum)
Subjective: Stable and alert.  She feels better.  Abdomen still a little bit uncomfortable.  No stool or flatus. X-ray last night shows NG tube in good position with scattered small bowel gas. She received her contrast last night. am. film pending Morning labs just drawn.  CBG 107.  Objective: Vital signs in last 24 hours: Temp:  [98 F (36.7 C)-98.4 F (36.9 C)] 98.2 F (36.8 C) (05/10 0450) Pulse Rate:  [94-114] 109 (05/10 0450) Resp:  [17-22] 18 (05/10 0450) BP: (119-173)/(70-106) 141/75 (05/10 0450) SpO2:  [90 %-97 %] 92 % (05/10 0450) Weight:  [88.4 kg-92.4 kg] 92.4 kg (05/10 0329) Last BM Date: 12/11/18  Intake/Output from previous day: 05/09 0701 - 05/10 0700 In: 1335.7 [I.V.:1335.7] Out: 0  Intake/Output this shift: Total I/O In: 1335.7 [I.V.:1335.7] Out: 0   General appearance: Alert and cooperative.  Only mild distress.  Lots of bilious drainage in NG canister. Resp: clear to auscultation bilaterally GI: Soft.  Obese.  Occasional bowel sounds.  A little bit tender in the lower abdomen but no peritoneal signs.  Midline incision healed without hernia noted.  NG canister full of bilious fluid.  NG output not recorded. Extremities: extremities normal, atraumatic, no cyanosis or edema  Lab Results:  Recent Labs    12/15/18 1521  WBC 13.3*  HGB 16.2*  HCT 48.2*  PLT 346   BMET Recent Labs    12/15/18 1521  NA 134*  K 4.4  CL 100  CO2 19*  GLUCOSE 429*  BUN 13  CREATININE 1.09*  CALCIUM 9.5   PT/INR No results for input(s): LABPROT, INR in the last 72 hours. ABG No results for input(s): PHART, HCO3 in the last 72 hours.  Invalid input(s): PCO2, PO2  Studies/Results: Dg Abd 1 View  Result Date: 12/15/2018 CLINICAL DATA:  68 y/o  F; NG tube advancement. EXAM: ABDOMEN - 1 VIEW COMPARISON:  12/15/2018 abdomen radiograph FINDINGS: The enteric tube has been advanced into the distal stomach. Surgical clips project over the right upper quadrant,  presumably cholecystectomy. Lumbar fusion hardware noted. He distended bowel loops in left lower quadrant again noted. Retained contrast within the renal collecting systems. IMPRESSION: Enteric tube has been advanced into the distal stomach. Electronically Signed   By: Kristine Garbe M.D.   On: 12/15/2018 22:52   Ct Abdomen Pelvis W Contrast  Result Date: 12/15/2018 CLINICAL DATA:  Left-sided abdominal pain for several months EXAM: CT ABDOMEN AND PELVIS WITH CONTRAST TECHNIQUE: Multidetector CT imaging of the abdomen and pelvis was performed using the standard protocol following bolus administration of intravenous contrast. CONTRAST:  182mL OMNIPAQUE 300 COMPARISON:  01/08/2018 FINDINGS: Lower chest: No acute abnormality. Hepatobiliary: No focal liver abnormality is seen. Status post cholecystectomy. No biliary dilatation. Pancreas: Unremarkable. No pancreatic ductal dilatation or surrounding inflammatory changes. Spleen: Normal in size without focal abnormality. Adrenals/Urinary Tract: Adrenal glands are within normal limits. Kidneys demonstrate a normal enhancement pattern bilaterally. No renal calculi or obstructive changes are seen. Delayed images demonstrate normal excretion of contrast material. Bladder is well distended. Stomach/Bowel: Mild diverticular change of the colon is noted. No diverticulitis is seen. Postsurgical changes are noted in the ascending colon. The anastomosis appears patent with decompressed ileum proximal to the anastomosis. Several loops proximal to the anastomosis however there is a focal abrupt caliber change with more proximal small bowel dilatation consistent with at least partial small bowel obstruction. The appearance and location suggests underlying adhesions. The stomach is within normal limits. Vascular/Lymphatic: Aortic atherosclerosis. No  enlarged abdominal or pelvic lymph nodes. Reproductive: Uterus and bilateral adnexa are unremarkable. Other: No abdominal wall  hernia or abnormality. No abdominopelvic ascites. Musculoskeletal: Degenerative changes of the lumbar spine are noted. Postsurgical changes are seen from L2-L4. IMPRESSION: Postsurgical changes in the right mid abdomen with changes consistent with adhesions and at least partial distal small bowel obstruction as described. Chronic changes without acute abnormality. Electronically Signed   By: Inez Catalina M.D.   On: 12/15/2018 17:15   Dg Chest Portable 1 View  Result Date: 12/15/2018 CLINICAL DATA:  Acute chest pain. EXAM: PORTABLE CHEST 1 VIEW COMPARISON:  05/15/2018 and prior radiographs FINDINGS: The cardiomediastinal silhouette is unremarkable. There is no evidence of focal airspace disease, pulmonary edema, suspicious pulmonary nodule/mass, pleural effusion, or pneumothorax. No acute bony abnormalities are identified. LOWER cervical spine fusion hardware again noted. IMPRESSION: No active disease. Electronically Signed   By: Margarette Canada M.D.   On: 12/15/2018 15:22   Dg Abd Portable 1v  Result Date: 12/15/2018 CLINICAL DATA:  68 year old female status post NG tube placement. EXAM: PORTABLE ABDOMEN - 1 VIEW COMPARISON:  Abdominal radiograph dated 12/15/2018 and CT dated 12/15/2018 FINDINGS: There has been interval placement of an enteric tube with side-port approximately 3.5 cm distal to the GE junction and tip in the proximal stomach. Recommend further advancing of the tube into the stomach by approximately additional 5 cm for optimal positioning. Mildly distended loops of small bowel measuring 3.5 cm in the left lower quadrant noted. Excreted contrast noted in the renal collecting systems. There is degenerative changes of the spine with fixation hardware. IMPRESSION: Interval placement of an enteric tube with tip in the proximal stomach. Electronically Signed   By: Anner Crete M.D.   On: 12/15/2018 21:31   Dg Abd Portable 1 View  Result Date: 12/15/2018 CLINICAL DATA:  NG tube placement EXAM:  PORTABLE ABDOMEN - 1 VIEW COMPARISON:  None. FINDINGS: An NG tube is identified with tip overlying the proximal stomach and side hole overlying the distal esophagus. Bowel gas pattern is unremarkable. IMPRESSION: NG tube with tip overlying the proximal stomach and side hole overlying the distal esophagus. Recommend advancement. Electronically Signed   By: Margarette Canada M.D.   On: 12/15/2018 18:27    Anti-infectives: Anti-infectives (From admission, onward)   None      Assessment/Plan:   SBO.  At least partial.  Adhesions likely.  No sign of compromised bowel clinically.  Continue with small bowel protocol. Continue nonoperative management for now Strict intake and output ambulate  History laparoscopic right colectomy for villous adenoma 04/08/2016.  This required lysis of adhesions for 60 minutes History C-section Obesity Diabetes type 2 with insulin History DVT lower extremity-on Xarelto as outpatient.  Changing to Lovenox Hypertension Diastolic dysfunction-currently volume depleted.  Holding Lasix.  Rehydrating History of stroke. VTE prophylaxis - lovenox   LOS: 1 day    Adin Hector 12/16/2018

## 2018-12-16 NOTE — Progress Notes (Signed)
PROGRESS NOTE  [name] XBW:[id number] DOB: Jan 26, 1951 DOA: 12/15/2018 PCP: Harlan Stains, MD   LOS: 1 day   Brief narrative: [name] is a 68 y.o. female with past medical history of diabetes mellitus currently on insulin, GERD, hypertension, hyperlipidemia, hypothyroidism, history of stroke, diastolic dysfunction, fibromyalgia, prior history of DVT on Xarelto and multiple abdominal surgeries presented to the hospital with complaints of abdominal pain for 1 day associated with nausea and multiple episodes of vomiting, consideration for few days.  She tried MiraLAX and Dulcolax without relief.   In the ED, patient had a CT scan of the abdomen and pelvis which showed surgical changes in the right midabdomen with changes consistent with adhesions and at least a partial distal small bowel obstruction.  Surgery was consulted from the ED and was advised NG tube, IV fluids and conservative management.    Subjective: Patient still has intermittent abdominal pain.  Has mild nausea.  No passage of flatus/bowel movement.  No shortness of breath, cough, fever or chills.  Assessment/Plan:  Principal Problem:   Partial bowel obstruction (HCC) Active Problems:   Type II diabetes mellitus (HCC)   Hypothyroidism   Hypertension   Acute kidney injury ([place])   Diastolic dysfunction  Partial small bowel obstruction likely secondary to adhesions.    Continue n.p.o., IV fluids, IV antiemetics, IV analgesics as needed for pain.  Continue NG tube continue to replenish electrolytes as necessary.  Surgery on board.  Her ambulation.  Mild leukocytosis likely reactive.  Diabetes mellitus type 2 on insulin hyperglycemia on presentation.  Continue IV fluid hydration, Continue on sliding scale insulin and basal insulin - decreased dose.  Glycemic control is adequate so far.  History of hypothyroidism.  Hold oral Synthroid for now.  Consider IV Synthroid if unable to take p.o. in 2 to 3 days.  Mild acute  kidney injury secondary to volume depletion and bowel obstruction.  Proved with IV hydration. Continue IV fluid hydration.  Check BMP in a.m. continue to hold Lasix.  History of diastolic dysfunction.    Still mildly volume depleted.  Continue IV fluid hydration  Essential hypertension.  Will closely monitor.  Will consider PRN antihypertensives as needed.  GERD.  Continue with Protonix.  History of fibromyalgia.  On gabapentin.  Will hold at this time.  History of stroke.  Hold oral medications for now.  DVT.  On Xarelto as outpatient.  Therapeutic Lovenox here in the hospital  Consultant: General surgery  Code Status: Full code  DVT Prophylaxis: Lovenox  Antibiotics: None  Family Communication: None  Disposition Plan: Home likely in 2 to 3 days, follow surgical recommendations   Consultants:  General surgery   Procedures:  None  Antibiotics: Anti-infectives (From admission, onward)   None      Objective: Vitals:   12/16/18 0450 12/16/18 0906  BP: (!) 141/75 (!) 149/83  Pulse: (!) 109 (!) 103  Resp: 18   Temp: 98.2 F (36.8 C) 98.4 F (36.9 C)  SpO2: 92% 90%    Intake/Output Summary (Last 24 hours) at 12/16/2018 1139 Last data filed at 12/16/2018 0900 Gross per 24 hour  Intake 1335.66 ml  Output 900 ml  Net 435.66 ml   Filed Weights   12/15/18 1455 12/15/18 2041 12/16/18 0329  Weight: 88.4 kg 92.4 kg 92.4 kg   Body mass index is 36.08 kg/m.   Physical Exam: GENERAL: Patient is alert awake and oriented. Not in obvious distress.  Morbidly obese.  NG tube in  place. HENT: No scleral pallor or icterus. Pupils equally reactive to light. Oral mucosa is dry. NECK: is supple, no palpable thyroid enlargement. CHEST: Clear to auscultation. No crackles or wheezes. Non tender on palpation. Diminished breath sounds bilaterally. CVS: S1 and S2 heard, no murmur. Regular rate and rhythm. No pericardial rub. ABDOMEN: Soft, healed abdominal scar noted.   Bowel sounds present.  Nonspecific tenderness noted.  Obese abdomen.  No palpable hepato-splenomegaly. EXTREMITIES: No edema. CNS: Cranial nerves are intact. No focal motor or sensory deficits. SKIN: warm and dry without rashes.   Data Review: I have personally reviewed the following laboratory data and studies,  CBC: Recent Labs  Lab 12/15/18 1521 12/16/18 0637  WBC 13.3* 11.4*  NEUTROABS 11.3*  --   HGB 16.2* 14.9  HCT 48.2* 44.2  MCV 81.7 83.4  PLT 346 825   Basic Metabolic Panel: Recent Labs  Lab 12/15/18 1521 12/16/18 0637  NA 134* 140  K 4.4 4.2  CL 100 103  CO2 19* 24  GLUCOSE 429* 145*  BUN 13 14  CREATININE 1.09* 0.91  CALCIUM 9.5 9.1  MG  --  1.9  PHOS  --  4.5   Liver Function Tests: Recent Labs  Lab 12/15/18 1521 12/16/18 0637  AST 15 12*  ALT 14 12  ALKPHOS 122 114  BILITOT 1.1 0.8  PROT 7.1 6.5  ALBUMIN 3.9 3.6   Recent Labs  Lab 12/15/18 1521  LIPASE 22   No results for input(s): AMMONIA in the last 168 hours. Cardiac Enzymes: Recent Labs  Lab 12/15/18 1521  TROPONINI <0.03   BNP (last 3 results) No results for input(s): BNP in the last 8760 hours.  ProBNP (last 3 results) No results for input(s): PROBNP in the last 8760 hours.  CBG: Recent Labs  Lab 12/15/18 2042 12/16/18 0017 12/16/18 0448 12/16/18 0717 12/16/18 1116  GLUCAP 204* 114* 107* 151* 130*   Recent Results (from the past 240 hour(s))  SARS Coronavirus 2 (CEPHEID - Performed in [place] hospital lab), Hosp Order     Status: None   Collection Time: 12/15/18  3:21 PM  Result Value Ref Range Status   SARS Coronavirus 2 NEGATIVE NEGATIVE Final    Comment: (NOTE) If result is NEGATIVE SARS-CoV-2 target nucleic acids are NOT DETECTED. The SARS-CoV-2 RNA is generally detectable in upper and lower  respiratory specimens during the acute phase of infection. The lowest  concentration of SARS-CoV-2 viral copies this assay can detect is 250  copies / mL. A  negative result does not preclude SARS-CoV-2 infection  and should not be used as the sole basis for treatment or other  patient management decisions.  A negative result may occur with  improper specimen collection / handling, submission of specimen other  than nasopharyngeal swab, presence of viral mutation(s) within the  areas targeted by this assay, and inadequate number of viral copies  (<250 copies / mL). A negative result must be combined with clinical  observations, patient history, and epidemiological information. If result is POSITIVE SARS-CoV-2 target nucleic acids are DETECTED. The SARS-CoV-2 RNA is generally detectable in upper and lower  respiratory specimens dur ing the acute phase of infection.  Positive  results are indicative of active infection with SARS-CoV-2.  Clinical  correlation with patient history and other diagnostic information is  necessary to determine patient infection status.  Positive results do  not rule out bacterial infection or co-infection with other viruses. If result is PRESUMPTIVE POSTIVE SARS-CoV-2 nucleic acids  MAY BE PRESENT.   A presumptive positive result was obtained on the submitted specimen  and confirmed on repeat testing.  While 2019 novel coronavirus  (SARS-CoV-2) nucleic acids may be present in the submitted sample  additional confirmatory testing may be necessary for epidemiological  and / or clinical management purposes  to differentiate between  SARS-CoV-2 and other Sarbecovirus currently known to infect humans.  If clinically indicated additional testing with an alternate test  methodology [phone]) is advised. The SARS-CoV-2 RNA is generally  detectable in upper and lower respiratory sp ecimens during the acute  phase of infection. The expected result is Negative. Fact Sheet for Patients:  StrictlyIdeas.no Fact Sheet for Healthcare Providers: BankingDealers.co.za This test is not  yet approved or cleared by the [place] FDA and has been authorized for detection and/or diagnosis of SARS-CoV-2 by FDA under an Emergency Use Authorization (EUA).  This EUA will remain in effect (meaning this test can be used) for the duration of the COVID-19 declaration under Section 564(b)(1) of the Act, 21 U.S.C. section 360bbb-3(b)(1), unless the authorization is terminated or revoked sooner. Performed at [facility], [place] [address] [place], [place] [address]      Studies: Dg Abd 1 View  Result Date: 12/15/2018 CLINICAL DATA:  68 y/o  F; NG tube advancement. EXAM: ABDOMEN - 1 VIEW COMPARISON:  12/15/2018 abdomen radiograph FINDINGS: The enteric tube has been advanced into the distal stomach. Surgical clips project over the right upper quadrant, presumably cholecystectomy. Lumbar fusion hardware noted. He distended bowel loops in left lower quadrant again noted. Retained contrast within the renal collecting systems. IMPRESSION: Enteric tube has been advanced into the distal stomach. Electronically Signed   By: Kristine Garbe M.D.   On: 12/15/2018 22:52   Ct Abdomen Pelvis W Contrast  Result Date: 12/15/2018 CLINICAL DATA:  Left-sided abdominal pain for several months EXAM: CT ABDOMEN AND PELVIS WITH CONTRAST TECHNIQUE: Multidetector CT imaging of the abdomen and pelvis was performed using the standard protocol following bolus administration of intravenous contrast. CONTRAST:  129mL OMNIPAQUE 300 COMPARISON:  01/08/2018 FINDINGS: Lower chest: No acute abnormality. Hepatobiliary: No focal liver abnormality is seen. Status post cholecystectomy. No biliary dilatation. Pancreas: Unremarkable. No pancreatic ductal dilatation or surrounding inflammatory changes. Spleen: Normal in size without focal abnormality. Adrenals/Urinary Tract: Adrenal glands are within normal limits. Kidneys demonstrate a normal enhancement pattern bilaterally. No renal calculi or obstructive changes are  seen. Delayed images demonstrate normal excretion of contrast material. Bladder is well distended. Stomach/Bowel: Mild diverticular change of the colon is noted. No diverticulitis is seen. Postsurgical changes are noted in the ascending colon. The anastomosis appears patent with decompressed ileum proximal to the anastomosis. Several loops proximal to the anastomosis however there is a focal abrupt caliber change with more proximal small bowel dilatation consistent with at least partial small bowel obstruction. The appearance and location suggests underlying adhesions. The stomach is within normal limits. Vascular/Lymphatic: Aortic atherosclerosis. No enlarged abdominal or pelvic lymph nodes. Reproductive: Uterus and bilateral adnexa are unremarkable. Other: No abdominal wall hernia or abnormality. No abdominopelvic ascites. Musculoskeletal: Degenerative changes of the lumbar spine are noted. Postsurgical changes are seen from L2-L4. IMPRESSION: Postsurgical changes in the right mid abdomen with changes consistent with adhesions and at least partial distal small bowel obstruction as described. Chronic changes without acute abnormality. Electronically Signed   By: Inez Catalina M.D.   On: 12/15/2018 17:15   Dg Chest Portable 1 View  Result Date: 12/15/2018 CLINICAL  DATA:  Acute chest pain. EXAM: PORTABLE CHEST 1 VIEW COMPARISON:  05/15/2018 and prior radiographs FINDINGS: The cardiomediastinal silhouette is unremarkable. There is no evidence of focal airspace disease, pulmonary edema, suspicious pulmonary nodule/mass, pleural effusion, or pneumothorax. No acute bony abnormalities are identified. LOWER cervical spine fusion hardware again noted. IMPRESSION: No active disease. Electronically Signed   By: Margarette Canada M.D.   On: 12/15/2018 15:22   Dg Abd Portable 1v-small Bowel Obstruction Protocol-initial, 8 Hr Delay  Result Date: 12/16/2018 CLINICAL DATA:  Lower abdominal pain. EXAM: PORTABLE ABDOMEN - 1 VIEW  COMPARISON:  Radiograph of Dec 15, 2018. FINDINGS: Mildly dilated small bowel loops are noted centrally concerning for possible small bowel obstruction. Distal tip of nasogastric tube is seen in expected position of distal stomach. Status post cholecystectomy. IMPRESSION: Mildly dilated small bowel loops are noted concerning for possible distal small bowel obstruction. Nasogastric tube tip seen in expected position of distal stomach. Electronically Signed   By: Marijo Conception M.D.   On: 12/16/2018 08:11   Dg Abd Portable 1v  Result Date: 12/15/2018 CLINICAL DATA:  68 year old female status post NG tube placement. EXAM: PORTABLE ABDOMEN - 1 VIEW COMPARISON:  Abdominal radiograph dated 12/15/2018 and CT dated 12/15/2018 FINDINGS: There has been interval placement of an enteric tube with side-port approximately 3.5 cm distal to the GE junction and tip in the proximal stomach. Recommend further advancing of the tube into the stomach by approximately additional 5 cm for optimal positioning. Mildly distended loops of small bowel measuring 3.5 cm in the left lower quadrant noted. Excreted contrast noted in the renal collecting systems. There is degenerative changes of the spine with fixation hardware. IMPRESSION: Interval placement of an enteric tube with tip in the proximal stomach. Electronically Signed   By: Anner Crete M.D.   On: 12/15/2018 21:31   Dg Abd Portable 1 View  Result Date: 12/15/2018 CLINICAL DATA:  NG tube placement EXAM: PORTABLE ABDOMEN - 1 VIEW COMPARISON:  None. FINDINGS: An NG tube is identified with tip overlying the proximal stomach and side hole overlying the distal esophagus. Bowel gas pattern is unremarkable. IMPRESSION: NG tube with tip overlying the proximal stomach and side hole overlying the distal esophagus. Recommend advancement. Electronically Signed   By: Margarette Canada M.D.   On: 12/15/2018 18:27    Scheduled Meds: . enoxaparin (LOVENOX) injection  1 mg/kg Subcutaneous Q12H   . insulin aspart  0-15 Units Subcutaneous Q4H  . insulin glargine  20 Units Subcutaneous Q2200    Continuous Infusions: . sodium chloride 150 mL/hr at 12/16/18 1042     Flora Lipps, MD  [facility] 12/16/2018

## 2018-12-16 NOTE — Progress Notes (Signed)
Pt refused other 10 units of Lantus. Pt stated she only wanted 10 units instead of 20 units. Wasted other 10 units with Janora Norlander RN.   Eleanora Neighbor, RN

## 2018-12-16 NOTE — Progress Notes (Signed)
Dye was put in NG tube at 2352. RN called Radiology to make aware. Radiology stated they would get another film 8 hours from this time.   Eleanora Neighbor, RN

## 2018-12-16 NOTE — Progress Notes (Signed)
Pt tube supposed to be clamped for an hour post dye insertion. It has been a little over an hour. Pt is now hooked up to low intermittent suction at this time.  Eleanora Neighbor, RN

## 2018-12-16 NOTE — Evaluation (Signed)
Physical Therapy Evaluation Patient Details Name: [name] MRN: [id number] DOB: 01/05/1951 Today's Date: 12/16/2018   History of Present Illness  [name] is a 68 y.o. female with past medical history of diabetes mellitus currently on insulin, GERD, hypertension, hyperlipidemia, hypothyroidism, history of stroke, diastolic dysfunction, fibromyalgia and prior history of DVT on Xarelto, multiple abdominal surgeries presented to the hospital with complaints of abdominal pain; SBO initiating conservative small bowel protocol  Clinical Impression   Pt admitted with above diagnosis. Pt currently with functional limitations due to the deficits listed below (see PT Problem List). Independent prior to admission; Overall moving well, but with noted decr activity tolerance, nausea, and dizziness after ambulation; I anticipate a good recovery;  Pt will benefit from skilled PT to increase their independence and safety with mobility to allow discharge to the venue listed below.       Follow Up Recommendations No PT follow up(I anticipate good recovery)    Equipment Recommendations       Recommendations for Other Services       Precautions / Restrictions Precautions Precaution Comments: Encourage self-monitoring for activity tolernace      Mobility  Bed Mobility   Bed Mobility: Supine to Sit     Supine to sit: Supervision     General bed mobility comments: Supervision for safety  Transfers Overall transfer level: Needs assistance Equipment used: None Transfers: Sit to/from Stand Sit to Stand: Min guard         General transfer comment: [place] for safety; noted little difficulty getting up from low commode as well, used grab bar  Ambulation/Gait Ambulation/Gait assistance: Min guard Gait Distance (Feet): 110 Feet Assistive device: IV Pole Gait Pattern/deviations: Step-through pattern     General Gait Details: Cues to self-monitor for activity tolerance  Stairs             Wheelchair Mobility    Modified Rankin (Stroke Patients Only)       Balance Overall balance assessment: Mild deficits observed, not formally tested                                           Pertinent Vitals/Pain Pain Assessment: No/denies pain(but significant nauseau and dizziness post walk)    Home Living Family/patient expects to be discharged to:: Private residence Living Arrangements: Spouse/significant other Available Help at Discharge: Family;Available 24 hours/day Type of Home: House Home Access: Stairs to enter Entrance Stairs-Rails: None Entrance Stairs-Number of Steps: 4 Home Layout: One level        Prior Function Level of Independence: Independent               Hand Dominance   Dominant Hand: Right    Extremity/Trunk Assessment   Upper Extremity Assessment Upper Extremity Assessment: Overall WFL for tasks assessed    Lower Extremity Assessment Lower Extremity Assessment: Generalized weakness       Communication   Communication: No difficulties  Cognition Arousal/Alertness: Awake/alert Behavior During Therapy: WFL for tasks assessed/performed Overall Cognitive Status: Within Functional Limits for tasks assessed                                        General Comments General comments (skin integrity, edema, etc.): RN notified of nausea at end of walk  Exercises     Assessment/Plan    PT Assessment Patient needs continued PT services  PT Problem List Decreased activity tolerance;Decreased balance;Decreased mobility;Decreased knowledge of use of DME;Decreased knowledge of precautions       PT Treatment Interventions DME instruction;Gait training;Stair training;Functional mobility training;Therapeutic activities;Therapeutic exercise;Balance training;Patient/family education    PT Goals (Current goals can be found in the Care Plan section)  Acute Rehab PT Goals Patient Stated Goal:  feel better and get home PT Goal Formulation: With patient Time For Goal Achievement: 12/30/18 Potential to Achieve Goals: Good    Frequency Min 3X/week   Barriers to discharge        Co-evaluation               AM-PAC PT "6 Clicks" Mobility  Outcome Measure Help needed turning from your back to your side while in a flat bed without using bedrails?: None Help needed moving from lying on your back to sitting on the side of a flat bed without using bedrails?: None Help needed moving to and from a bed to a chair (including a wheelchair)?: None Help needed standing up from a chair using your arms (e.g., wheelchair or bedside chair)?: None Help needed to walk in hospital room?: A Little Help needed climbing 3-5 steps with a railing? : A Little 6 Click Score: 22    End of Session   Activity Tolerance: Patient tolerated treatment well;Other (comment)(though dizzy and nauseated at end of walk) Patient left: in bed;with call bell/phone within reach Nurse Communication: Mobility status;Other (comment)(nausea) PT Visit Diagnosis: Unsteadiness on feet (R26.81);Other (comment)(decr activity tolerance)    Time: 0737-1062 PT Time Calculation (min) (ACUTE ONLY): 25 min   Charges:   PT Evaluation $PT Eval Low Complexity: 1 Low PT Treatments $Gait Training: 8-22 mins        Roney Marion, PT  Acute Rehabilitation Services Pager [phone] Office [place] 12/16/2018, 12:09 PM

## 2018-12-17 ENCOUNTER — Inpatient Hospital Stay (HOSPITAL_COMMUNITY): Payer: Medicare Other

## 2018-12-17 ENCOUNTER — Other Ambulatory Visit: Payer: Self-pay

## 2018-12-17 LAB — BASIC METABOLIC PANEL
Anion gap: 12 (ref 5–15)
BUN: 15 mg/dL (ref 8–23)
CO2: 24 mmol/L (ref 22–32)
Calcium: 8.9 mg/dL (ref 8.9–10.3)
Chloride: 105 mmol/L (ref 98–111)
Creatinine, Ser: 1 mg/dL (ref 0.44–1.00)
GFR calc Af Amer: >60 mL/min (ref >60)
GFR calc non Af Amer: 58 mL/min — ABNORMAL LOW (ref >60)
Glucose, Bld: 200 mg/dL — ABNORMAL HIGH (ref 70–99)
Potassium: 4.1 mmol/L (ref 3.5–5.1)
Sodium: 141 mmol/L (ref 135–145)

## 2018-12-17 LAB — PHOSPHORUS: Phosphorus: 3.5 mg/dL (ref 2.5–4.6)

## 2018-12-17 LAB — GLUCOSE, CAPILLARY
Glucose-Capillary: 136 mg/dL — ABNORMAL HIGH (ref 70–99)
Glucose-Capillary: 136 mg/dL — ABNORMAL HIGH (ref 70–99)
Glucose-Capillary: 160 mg/dL — ABNORMAL HIGH (ref 70–99)
Glucose-Capillary: 175 mg/dL — ABNORMAL HIGH (ref 70–99)
Glucose-Capillary: 201 mg/dL — ABNORMAL HIGH (ref 70–99)
Glucose-Capillary: 83 mg/dL (ref 70–99)

## 2018-12-17 LAB — CBC
HCT: 43.9 % (ref 36.0–46.0)
Hemoglobin: 14.5 g/dL (ref 12.0–15.0)
MCH: 28 pg (ref 26.0–34.0)
MCHC: 33 g/dL (ref 30.0–36.0)
MCV: 84.9 fL (ref 80.0–100.0)
Platelets: 292 10*3/uL (ref 150–400)
RBC: 5.17 MIL/uL — ABNORMAL HIGH (ref 3.87–5.11)
RDW: 13.2 % (ref 11.5–15.5)
WBC: 8.7 10*3/uL (ref 4.0–10.5)
nRBC: 0 % (ref 0.0–0.2)

## 2018-12-17 LAB — MAGNESIUM: Magnesium: 1.9 mg/dL (ref 1.7–2.4)

## 2018-12-17 MED ORDER — MORPHINE SULFATE (PF) 4 MG/ML IV SOLN
4.0000 mg | INTRAVENOUS | Status: DC | PRN
  Administered 2018-12-17 – 2018-12-18 (×7): 4 mg via INTRAVENOUS
  Filled 2018-12-17 (×7): qty 1

## 2018-12-17 MED ORDER — HYDRALAZINE HCL 20 MG/ML IJ SOLN
10.0000 mg | INTRAMUSCULAR | Status: DC | PRN
  Administered 2018-12-17 – 2018-12-22 (×4): 10 mg via INTRAVENOUS
  Filled 2018-12-17 (×4): qty 1

## 2018-12-17 NOTE — Progress Notes (Signed)
PT Cancellation Note  Patient Details Name: [name] MRN: [id number] DOB: [birth year]   Cancelled Treatment:    Reason Eval/Treat Not Completed: Other (comment)   [name] is having persistent nausea/abdominal pain at this time, and politely declines OOB/ambulation; She is aware of the benefits of mobilizing, and is not happy that she feels so bad and cannot walk right now;   Will follow up later today as time allows;  Otherwise, will follow up for PT tomorrow;   Thank you,  Roney Marion, PT  Acute Rehabilitation Services Pager [phone] Office [place] 12/17/2018, 11:44 AM

## 2018-12-17 NOTE — Progress Notes (Signed)
PT clamped to go down for 2 view x-ray.  Eleanora Neighbor, RN

## 2018-12-17 NOTE — Progress Notes (Signed)
[facility] Progress Note     Subjective: CC-  Patient reports increased abdominal pain today. States that it comes in waves. She reports nausea, no emesis. Bloating about the same.  1400cc from NG tube last 24 hours. Passed gas 6 times yesterday, no BM.  WBC down 8.7. BP elevated 181/88.  Objective: Vital signs in last 24 hours: Temp:  [97.9 F (36.6 C)-99.2 F (37.3 C)] 99.2 F (37.3 C) (05/11 0509) Pulse Rate:  [103-118] 108 (05/11 0509) Resp:  [16-19] 16 (05/11 0509) BP: (149-196)/(83-104) 181/88 (05/11 0509) SpO2:  [90 %-93 %] 93 % (05/11 0509) Weight:  [94.6 kg] 94.6 kg (05/11 0615) Last BM Date: 12/11/18  Intake/Output from previous day: 05/10 0701 - 05/11 0700 In: 3023.8 [I.V.:3023.8] Out: 2050 [Urine:650; Emesis/NG output:1400] Intake/Output this shift: No intake/output data recorded.  PE: Gen:  Alert, NAD HEENT: EOM's intact, pupils equal and round Card:  tachy Pulm:  CTAB, no W/R/R, effort normal Abd: Soft, mild distension, hypoactive BS, well healed midline incision, mild LUQ>RUQ TTP without rebound or guarding Ext:  Calves soft and nontender Psych: A&Ox3  Skin: no rashes noted, warm and dry  Lab Results:  Recent Labs    12/16/18 0637 12/17/18 0625  WBC 11.4* 8.7  HGB 14.9 14.5  HCT 44.2 43.9  PLT 320 292   BMET Recent Labs    12/16/18 0637 12/17/18 0625  NA 140 141  K 4.2 4.1  CL 103 105  CO2 24 24  GLUCOSE 145* 200*  BUN 14 15  CREATININE 0.91 1.00  CALCIUM 9.1 8.9   PT/INR No results for input(s): LABPROT, INR in the last 72 hours. CMP     Component Value Date/Time   NA 141 12/17/2018 0625   K 4.1 12/17/2018 0625   CL 105 12/17/2018 0625   CO2 24 12/17/2018 0625   GLUCOSE 200 (H) 12/17/2018 0625   BUN 15 12/17/2018 0625   CREATININE 1.00 12/17/2018 0625   CALCIUM 8.9 12/17/2018 0625   PROT 6.5 12/16/2018 0637   ALBUMIN 3.6 12/16/2018 0637   AST 12 (L) 12/16/2018 0637   ALT 12 12/16/2018 0637   ALKPHOS 114  12/16/2018 0637   BILITOT 0.8 12/16/2018 0637   GFRNONAA 58 (L) 12/17/2018 0625   GFRAA >60 12/17/2018 0625   Lipase     Component Value Date/Time   LIPASE 22 12/15/2018 1521       Studies/Results: Dg Abd 1 View  Result Date: 12/15/2018 CLINICAL DATA:  68 y/o  F; NG tube advancement. EXAM: ABDOMEN - 1 VIEW COMPARISON:  12/15/2018 abdomen radiograph FINDINGS: The enteric tube has been advanced into the distal stomach. Surgical clips project over the right upper quadrant, presumably cholecystectomy. Lumbar fusion hardware noted. He distended bowel loops in left lower quadrant again noted. Retained contrast within the renal collecting systems. IMPRESSION: Enteric tube has been advanced into the distal stomach. Electronically Signed   By: Kristine Garbe M.D.   On: 12/15/2018 22:52   Ct Abdomen Pelvis W Contrast  Result Date: 12/15/2018 CLINICAL DATA:  Left-sided abdominal pain for several months EXAM: CT ABDOMEN AND PELVIS WITH CONTRAST TECHNIQUE: Multidetector CT imaging of the abdomen and pelvis was performed using the standard protocol following bolus administration of intravenous contrast. CONTRAST:  141mL OMNIPAQUE 300 COMPARISON:  01/08/2018 FINDINGS: Lower chest: No acute abnormality. Hepatobiliary: No focal liver abnormality is seen. Status post cholecystectomy. No biliary dilatation. Pancreas: Unremarkable. No pancreatic ductal dilatation or surrounding inflammatory changes. Spleen: Normal in size without  focal abnormality. Adrenals/Urinary Tract: Adrenal glands are within normal limits. Kidneys demonstrate a normal enhancement pattern bilaterally. No renal calculi or obstructive changes are seen. Delayed images demonstrate normal excretion of contrast material. Bladder is well distended. Stomach/Bowel: Mild diverticular change of the colon is noted. No diverticulitis is seen. Postsurgical changes are noted in the ascending colon. The anastomosis appears patent with decompressed  ileum proximal to the anastomosis. Several loops proximal to the anastomosis however there is a focal abrupt caliber change with more proximal small bowel dilatation consistent with at least partial small bowel obstruction. The appearance and location suggests underlying adhesions. The stomach is within normal limits. Vascular/Lymphatic: Aortic atherosclerosis. No enlarged abdominal or pelvic lymph nodes. Reproductive: Uterus and bilateral adnexa are unremarkable. Other: No abdominal wall hernia or abnormality. No abdominopelvic ascites. Musculoskeletal: Degenerative changes of the lumbar spine are noted. Postsurgical changes are seen from L2-L4. IMPRESSION: Postsurgical changes in the right mid abdomen with changes consistent with adhesions and at least partial distal small bowel obstruction as described. Chronic changes without acute abnormality. Electronically Signed   By: Inez Catalina M.D.   On: 12/15/2018 17:15   Dg Chest Portable 1 View  Result Date: 12/15/2018 CLINICAL DATA:  Acute chest pain. EXAM: PORTABLE CHEST 1 VIEW COMPARISON:  05/15/2018 and prior radiographs FINDINGS: The cardiomediastinal silhouette is unremarkable. There is no evidence of focal airspace disease, pulmonary edema, suspicious pulmonary nodule/mass, pleural effusion, or pneumothorax. No acute bony abnormalities are identified. LOWER cervical spine fusion hardware again noted. IMPRESSION: No active disease. Electronically Signed   By: Margarette Canada M.D.   On: 12/15/2018 15:22   Dg Abd 2 Views  Result Date: 12/17/2018 CLINICAL DATA:  68 year old female with abdominal pain, nausea and shortness of breath since Saturday EXAM: ABDOMEN - 2 VIEW COMPARISON:  Prior chest x-ray 12/16/2018 FINDINGS: Left lateral decubitus radiograph demonstrates no evidence of free air. There are multiple air-fluid levels within both small bowel and colon. The gastric tube projects over the stomach. Surgical clips in the right upper quadrant consistent with  prior cholecystectomy. Evidence of prior extensive lumbar spine surgery including L2-L4 posterior lumbar interbody fusion with an interbody cage at L2-L3 and successful ankylosis with interbody grafts at L3-L4, L4-L5 and L5-S1. IMPRESSION: 1. Multiple air-fluid levels within both large and small bowel on the lateral decubitus view suggest persistent at least partial small bowel obstruction. 2. No evidence of free air. 3. Gastric tube is well positioned. Electronically Signed   By: Jacqulynn Cadet M.D.   On: 12/17/2018 07:51   Dg Abd Portable 1v-small Bowel Obstruction Protocol-initial, 8 Hr Delay  Result Date: 12/16/2018 CLINICAL DATA:  Lower abdominal pain. EXAM: PORTABLE ABDOMEN - 1 VIEW COMPARISON:  Radiograph of Dec 15, 2018. FINDINGS: Mildly dilated small bowel loops are noted centrally concerning for possible small bowel obstruction. Distal tip of nasogastric tube is seen in expected position of distal stomach. Status post cholecystectomy. IMPRESSION: Mildly dilated small bowel loops are noted concerning for possible distal small bowel obstruction. Nasogastric tube tip seen in expected position of distal stomach. Electronically Signed   By: Marijo Conception M.D.   On: 12/16/2018 08:11   Dg Abd Portable 1v  Result Date: 12/15/2018 CLINICAL DATA:  68 year old female status post NG tube placement. EXAM: PORTABLE ABDOMEN - 1 VIEW COMPARISON:  Abdominal radiograph dated 12/15/2018 and CT dated 12/15/2018 FINDINGS: There has been interval placement of an enteric tube with side-port approximately 3.5 cm distal to the GE junction and tip in the  proximal stomach. Recommend further advancing of the tube into the stomach by approximately additional 5 cm for optimal positioning. Mildly distended loops of small bowel measuring 3.5 cm in the left lower quadrant noted. Excreted contrast noted in the renal collecting systems. There is degenerative changes of the spine with fixation hardware. IMPRESSION: Interval  placement of an enteric tube with tip in the proximal stomach. Electronically Signed   By: Anner Crete M.D.   On: 12/15/2018 21:31   Dg Abd Portable 1 View  Result Date: 12/15/2018 CLINICAL DATA:  NG tube placement EXAM: PORTABLE ABDOMEN - 1 VIEW COMPARISON:  None. FINDINGS: An NG tube is identified with tip overlying the proximal stomach and side hole overlying the distal esophagus. Bowel gas pattern is unremarkable. IMPRESSION: NG tube with tip overlying the proximal stomach and side hole overlying the distal esophagus. Recommend advancement. Electronically Signed   By: Margarette Canada M.D.   On: 12/15/2018 18:27    Anti-infectives: Anti-infectives (From admission, onward)   None       Assessment/Plan HTN DM-II Diastolic dysfunction Hypothyroidism Fibromyalgia H/o stroke Obestity H/o DVT on Xarelto at home (last dose 5/8) - on therapeutic lovenox this admission AKI - Cr 1, resolved  SBO - h/o tubal ligation, appendectomy, c section x2, open cholecystectomy, laparoscopic right colectomy for villous adenoma 04/08/2016 - Xray today shows persistent SBO. Patient is passing some flatus, but having more abdominal pain. NG tube with high output. WBC WNL. BP elevated but vitals otherwise stable. Continue nonoperative management for now with NPO/NGT to LIWS. Mobilize. Repeat xray in AM.  ID - none FEN - IVF, NPO/NGT  VTE - lovenox 90mg  BID Foley - none Follow up - TBD   LOS: 2 days    Wellington [place] , [facility] 12/17/2018, 8:11 AM Pager: [phone] Mon-Thurs 7:00 am-4:30 pm Fri 7:00 am -11:30 AM Sat-Sun 7:00 am-11:30 am

## 2018-12-17 NOTE — Progress Notes (Signed)
PROGRESS NOTE  [name] YYT:[id number] DOB: Sep 25, 1950 DOA: 12/15/2018 PCP: Harlan Stains, MD   LOS: 2 days   Brief narrative: [name] is a 68 y.o. female with past medical history of diabetes mellitus currently on insulin, GERD, hypertension, hyperlipidemia, hypothyroidism, history of stroke, diastolic dysfunction, fibromyalgia, prior history of DVT on Xarelto and multiple abdominal surgeries presented to the hospital with complaints of abdominal pain for 1 day associated with nausea and multiple episodes of vomiting, consideration for few days.  She tried MiraLAX and Dulcolax without relief.   In the ED, patient had a CT scan of the abdomen and pelvis which showed surgical changes in the right midabdomen with changes consistent with adhesions and at least a partial distal small bowel obstruction.  Surgery was consulted from the ED and was advised NG tube, IV fluids and conservative management.    Subjective: Patient still complains of severe intermittent abdominal pain colicky in nature.  Has mild nausea but no vomiting.  Passed some flatus without a bowel movement.  No fever, chills, cough or shortness of breath.  Assessment/Plan:  Principal Problem:   Partial bowel obstruction (HCC) Active Problems:   Type II diabetes mellitus (HCC)   Hypothyroidism   Hypertension   Acute kidney injury ([place])   Diastolic dysfunction  Partial small bowel obstruction likely secondary to adhesions.  Persistent abdominal pain.  Surgery on board.  There is a possibility of needing surgery if her condition does not improve.   Continue n.p.o., IV fluids, IV antiemetics, IV analgesics as needed for pain.  Continue NG tube oxygen, continue to replenish electrolytes as necessary.  Surgery on board.  Continue ambulation.  Spoke with general surgery  Mild leukocytosis likely reactive.  CBC temp  Diabetes mellitus type 2 on insulin hyperglycemia on presentation.  Continue IV fluid hydration, Continue on  sliding scale insulin and basal insulin - decreased dose.  Glycemic control is adequate so far.  Will closely watch for hypoglycemia.  History of hypothyroidism.  Hold oral Synthroid for now.  Consider IV Synthroid if unable to take p.o. in 2 to 3 days.  Mild acute kidney injury secondary to volume depletion and bowel obstruction.  Improved with IV hydration.  Continue to hold Lasix  History of diastolic dysfunction.    Still mildly volume depleted.  Continue IV fluid hydration.  Compensated at this time.  Essential hypertension.  Accelerated. Will closely monitor.    PRN hydralazine with improvement in her blood pressure.  Blood pressure is likely secondary to ongoing pain.    GERD.  Continue with Protonix.  History of fibromyalgia.  On gabapentin.  Will hold at this time.  History of stroke.  Hold oral medications for now.  DVT.  On Xarelto as outpatient.  Therapeutic Lovenox here in the hospital  Consultant: General surgery  Code Status: Full code  DVT Prophylaxis: Lovenox  Antibiotics: None  Family Communication: None  Disposition Plan: Home likely in 2 to 3 days, follow surgical recommendations  Consultants:  General surgery   Procedures:  None  Antibiotics: Anti-infectives (From admission, onward)   None      Objective: Vitals:   12/17/18 0509 12/17/18 0903  BP: (!) 181/88 (!) 161/74  Pulse: (!) 108 (!) 101  Resp: 16 18  Temp: 99.2 F (37.3 C) 98.3 F (36.8 C)  SpO2: 93% 97%    Intake/Output Summary (Last 24 hours) at 12/17/2018 0903 Last data filed at 12/17/2018 0528 Gross per 24 hour  Intake 3023.84 ml  Output 1150 ml  Net 1873.84 ml   Filed Weights   12/15/18 2041 12/16/18 0329 12/17/18 0615  Weight: 92.4 kg 92.4 kg 94.6 kg   Body mass index is 36.94 kg/m.   Physical Exam: General: Morbidly obese, in moderate respiratory distress.  NG tube in place HENT: Normocephalic, pupils equally reacting to light and accommodation.  No  scleral pallor or icterus noted. Oral mucosa is dry. Chest:  Clear breath sounds.  Diminished breath sounds bilaterally. No crackles or wheezes.  CVS: S1 &S2 heard. No murmur.  Regular rate and rhythm. Abdomen: Soft, mild nonspecific tenderness.  Obese abdomen.  Previous surgical scar noted..  Bowel sounds are heard.  Liver is not palpable, no abdominal mass palpated Extremities: No cyanosis, clubbing or edema.  Peripheral pulses are palpable. Psych: Alert, awake and oriented, normal mood CNS:  No cranial nerve deficits.  Power equal in all extremities.  No sensory deficits noted.  No cerebellar signs.   Skin: Warm and dry.  No rashes noted.  Data Review: I have personally reviewed the following laboratory data and studies,  CBC: Recent Labs  Lab 12/15/18 1521 12/16/18 0637 12/17/18 0625  WBC 13.3* 11.4* 8.7  NEUTROABS 11.3*  --   --   HGB 16.2* 14.9 14.5  HCT 48.2* 44.2 43.9  MCV 81.7 83.4 84.9  PLT 346 320 740   Basic Metabolic Panel: Recent Labs  Lab 12/15/18 1521 12/16/18 0637 12/17/18 0625  NA 134* 140 141  K 4.4 4.2 4.1  CL 100 103 105  CO2 19* 24 24  GLUCOSE 429* 145* 200*  BUN 13 14 15   CREATININE 1.09* 0.91 1.00  CALCIUM 9.5 9.1 8.9  MG  --  1.9 1.9  PHOS  --  4.5 3.5   Liver Function Tests: Recent Labs  Lab 12/15/18 1521 12/16/18 0637  AST 15 12*  ALT 14 12  ALKPHOS 122 114  BILITOT 1.1 0.8  PROT 7.1 6.5  ALBUMIN 3.9 3.6   Recent Labs  Lab 12/15/18 1521  LIPASE 22   No results for input(s): AMMONIA in the last 168 hours. Cardiac Enzymes: Recent Labs  Lab 12/15/18 1521  TROPONINI <0.03   BNP (last 3 results) No results for input(s): BNP in the last 8760 hours.  ProBNP (last 3 results) No results for input(s): PROBNP in the last 8760 hours.  CBG: Recent Labs  Lab 12/16/18 1613 12/16/18 2110 12/17/18 0028 12/17/18 0358 12/17/18 0732  GLUCAP 137* 183* 201* 160* 175*   Recent Results (from the past 240 hour(s))  SARS Coronavirus 2  (CEPHEID - Performed in [place] hospital lab), Hosp Order     Status: None   Collection Time: 12/15/18  3:21 PM  Result Value Ref Range Status   SARS Coronavirus 2 NEGATIVE NEGATIVE Final    Comment: (NOTE) If result is NEGATIVE SARS-CoV-2 target nucleic acids are NOT DETECTED. The SARS-CoV-2 RNA is generally detectable in upper and lower  respiratory specimens during the acute phase of infection. The lowest  concentration of SARS-CoV-2 viral copies this assay can detect is 250  copies / mL. A negative result does not preclude SARS-CoV-2 infection  and should not be used as the sole basis for treatment or other  patient management decisions.  A negative result may occur with  improper specimen collection / handling, submission of specimen other  than nasopharyngeal swab, presence of viral mutation(s) within the  areas targeted by this assay, and inadequate number of viral copies  (<250 copies /  mL). A negative result must be combined with clinical  observations, patient history, and epidemiological information. If result is POSITIVE SARS-CoV-2 target nucleic acids are DETECTED. The SARS-CoV-2 RNA is generally detectable in upper and lower  respiratory specimens dur ing the acute phase of infection.  Positive  results are indicative of active infection with SARS-CoV-2.  Clinical  correlation with patient history and other diagnostic information is  necessary to determine patient infection status.  Positive results do  not rule out bacterial infection or co-infection with other viruses. If result is PRESUMPTIVE POSTIVE SARS-CoV-2 nucleic acids MAY BE PRESENT.   A presumptive positive result was obtained on the submitted specimen  and confirmed on repeat testing.  While 2019 novel coronavirus  (SARS-CoV-2) nucleic acids may be present in the submitted sample  additional confirmatory testing may be necessary for epidemiological  and / or clinical management purposes  to differentiate  between  SARS-CoV-2 and other Sarbecovirus currently known to infect humans.  If clinically indicated additional testing with an alternate test  methodology [phone]) is advised. The SARS-CoV-2 RNA is generally  detectable in upper and lower respiratory sp ecimens during the acute  phase of infection. The expected result is Negative. Fact Sheet for Patients:  StrictlyIdeas.no Fact Sheet for Healthcare Providers: BankingDealers.co.za This test is not yet approved or cleared by the [place] FDA and has been authorized for detection and/or diagnosis of SARS-CoV-2 by FDA under an Emergency Use Authorization (EUA).  This EUA will remain in effect (meaning this test can be used) for the duration of the COVID-19 declaration under Section 564(b)(1) of the Act, 21 U.S.C. section 360bbb-3(b)(1), unless the authorization is terminated or revoked sooner. Performed at [facility], [place] [address], [place], [place] [address]      Studies: Dg Abd 1 View  Result Date: 12/15/2018 CLINICAL DATA:  68 y/o  F; NG tube advancement. EXAM: ABDOMEN - 1 VIEW COMPARISON:  12/15/2018 abdomen radiograph FINDINGS: The enteric tube has been advanced into the distal stomach. Surgical clips project over the right upper quadrant, presumably cholecystectomy. Lumbar fusion hardware noted. He distended bowel loops in left lower quadrant again noted. Retained contrast within the renal collecting systems. IMPRESSION: Enteric tube has been advanced into the distal stomach. Electronically Signed   By: Kristine Garbe M.D.   On: 12/15/2018 22:52   Ct Abdomen Pelvis W Contrast  Result Date: 12/15/2018 CLINICAL DATA:  Left-sided abdominal pain for several months EXAM: CT ABDOMEN AND PELVIS WITH CONTRAST TECHNIQUE: Multidetector CT imaging of the abdomen and pelvis was performed using the standard protocol following bolus administration of intravenous contrast.  CONTRAST:  152mL OMNIPAQUE 300 COMPARISON:  01/08/2018 FINDINGS: Lower chest: No acute abnormality. Hepatobiliary: No focal liver abnormality is seen. Status post cholecystectomy. No biliary dilatation. Pancreas: Unremarkable. No pancreatic ductal dilatation or surrounding inflammatory changes. Spleen: Normal in size without focal abnormality. Adrenals/Urinary Tract: Adrenal glands are within normal limits. Kidneys demonstrate a normal enhancement pattern bilaterally. No renal calculi or obstructive changes are seen. Delayed images demonstrate normal excretion of contrast material. Bladder is well distended. Stomach/Bowel: Mild diverticular change of the colon is noted. No diverticulitis is seen. Postsurgical changes are noted in the ascending colon. The anastomosis appears patent with decompressed ileum proximal to the anastomosis. Several loops proximal to the anastomosis however there is a focal abrupt caliber change with more proximal small bowel dilatation consistent with at least partial small bowel obstruction. The appearance and location suggests underlying adhesions. The stomach is  within normal limits. Vascular/Lymphatic: Aortic atherosclerosis. No enlarged abdominal or pelvic lymph nodes. Reproductive: Uterus and bilateral adnexa are unremarkable. Other: No abdominal wall hernia or abnormality. No abdominopelvic ascites. Musculoskeletal: Degenerative changes of the lumbar spine are noted. Postsurgical changes are seen from L2-L4. IMPRESSION: Postsurgical changes in the right mid abdomen with changes consistent with adhesions and at least partial distal small bowel obstruction as described. Chronic changes without acute abnormality. Electronically Signed   By: Inez Catalina M.D.   On: 12/15/2018 17:15   Dg Chest Portable 1 View  Result Date: 12/15/2018 CLINICAL DATA:  Acute chest pain. EXAM: PORTABLE CHEST 1 VIEW COMPARISON:  05/15/2018 and prior radiographs FINDINGS: The cardiomediastinal silhouette is  unremarkable. There is no evidence of focal airspace disease, pulmonary edema, suspicious pulmonary nodule/mass, pleural effusion, or pneumothorax. No acute bony abnormalities are identified. LOWER cervical spine fusion hardware again noted. IMPRESSION: No active disease. Electronically Signed   By: Margarette Canada M.D.   On: 12/15/2018 15:22   Dg Abd 2 Views  Result Date: 12/17/2018 CLINICAL DATA:  68 year old female with abdominal pain, nausea and shortness of breath since Saturday EXAM: ABDOMEN - 2 VIEW COMPARISON:  Prior chest x-ray 12/16/2018 FINDINGS: Left lateral decubitus radiograph demonstrates no evidence of free air. There are multiple air-fluid levels within both small bowel and colon. The gastric tube projects over the stomach. Surgical clips in the right upper quadrant consistent with prior cholecystectomy. Evidence of prior extensive lumbar spine surgery including L2-L4 posterior lumbar interbody fusion with an interbody cage at L2-L3 and successful ankylosis with interbody grafts at L3-L4, L4-L5 and L5-S1. IMPRESSION: 1. Multiple air-fluid levels within both large and small bowel on the lateral decubitus view suggest persistent at least partial small bowel obstruction. 2. No evidence of free air. 3. Gastric tube is well positioned. Electronically Signed   By: Jacqulynn Cadet M.D.   On: 12/17/2018 07:51   Dg Abd Portable 1v-small Bowel Obstruction Protocol-initial, 8 Hr Delay  Result Date: 12/16/2018 CLINICAL DATA:  Lower abdominal pain. EXAM: PORTABLE ABDOMEN - 1 VIEW COMPARISON:  Radiograph of Dec 15, 2018. FINDINGS: Mildly dilated small bowel loops are noted centrally concerning for possible small bowel obstruction. Distal tip of nasogastric tube is seen in expected position of distal stomach. Status post cholecystectomy. IMPRESSION: Mildly dilated small bowel loops are noted concerning for possible distal small bowel obstruction. Nasogastric tube tip seen in expected position of distal  stomach. Electronically Signed   By: Marijo Conception M.D.   On: 12/16/2018 08:11   Dg Abd Portable 1v  Result Date: 12/15/2018 CLINICAL DATA:  68 year old female status post NG tube placement. EXAM: PORTABLE ABDOMEN - 1 VIEW COMPARISON:  Abdominal radiograph dated 12/15/2018 and CT dated 12/15/2018 FINDINGS: There has been interval placement of an enteric tube with side-port approximately 3.5 cm distal to the GE junction and tip in the proximal stomach. Recommend further advancing of the tube into the stomach by approximately additional 5 cm for optimal positioning. Mildly distended loops of small bowel measuring 3.5 cm in the left lower quadrant noted. Excreted contrast noted in the renal collecting systems. There is degenerative changes of the spine with fixation hardware. IMPRESSION: Interval placement of an enteric tube with tip in the proximal stomach. Electronically Signed   By: Anner Crete M.D.   On: 12/15/2018 21:31   Dg Abd Portable 1 View  Result Date: 12/15/2018 CLINICAL DATA:  NG tube placement EXAM: PORTABLE ABDOMEN - 1 VIEW COMPARISON:  None. FINDINGS: An  NG tube is identified with tip overlying the proximal stomach and side hole overlying the distal esophagus. Bowel gas pattern is unremarkable. IMPRESSION: NG tube with tip overlying the proximal stomach and side hole overlying the distal esophagus. Recommend advancement. Electronically Signed   By: Margarette Canada M.D.   On: 12/15/2018 18:27    Scheduled Meds:  enoxaparin (LOVENOX) injection  1 mg/kg Subcutaneous Q12H   insulin aspart  0-15 Units Subcutaneous Q4H   insulin glargine  20 Units Subcutaneous Q2200    Continuous Infusions:  sodium chloride 150 mL/hr at 12/16/18 1733     Flora Lipps, MD  [facility] 12/17/2018

## 2018-12-18 ENCOUNTER — Inpatient Hospital Stay (HOSPITAL_COMMUNITY): Payer: Medicare Other

## 2018-12-18 LAB — PHOSPHORUS: Phosphorus: 3.1 mg/dL (ref 2.5–4.6)

## 2018-12-18 LAB — BASIC METABOLIC PANEL
Anion gap: 8 (ref 5–15)
BUN: 15 mg/dL (ref 8–23)
CO2: 21 mmol/L — ABNORMAL LOW (ref 22–32)
Calcium: 8.4 mg/dL — ABNORMAL LOW (ref 8.9–10.3)
Chloride: 113 mmol/L — ABNORMAL HIGH (ref 98–111)
Creatinine, Ser: 0.98 mg/dL (ref 0.44–1.00)
GFR calc Af Amer: >60 mL/min (ref >60)
GFR calc non Af Amer: 59 mL/min — ABNORMAL LOW (ref >60)
Glucose, Bld: 157 mg/dL — ABNORMAL HIGH (ref 70–99)
Potassium: 3.5 mmol/L (ref 3.5–5.1)
Sodium: 142 mmol/L (ref 135–145)

## 2018-12-18 LAB — GLUCOSE, CAPILLARY
Glucose-Capillary: 111 mg/dL — ABNORMAL HIGH (ref 70–99)
Glucose-Capillary: 131 mg/dL — ABNORMAL HIGH (ref 70–99)
Glucose-Capillary: 133 mg/dL — ABNORMAL HIGH (ref 70–99)
Glucose-Capillary: 138 mg/dL — ABNORMAL HIGH (ref 70–99)
Glucose-Capillary: 164 mg/dL — ABNORMAL HIGH (ref 70–99)
Glucose-Capillary: 89 mg/dL (ref 70–99)

## 2018-12-18 LAB — CBC
HCT: 42.6 % (ref 36.0–46.0)
Hemoglobin: 13.8 g/dL (ref 12.0–15.0)
MCH: 27.7 pg (ref 26.0–34.0)
MCHC: 32.4 g/dL (ref 30.0–36.0)
MCV: 85.5 fL (ref 80.0–100.0)
Platelets: 288 10*3/uL (ref 150–400)
RBC: 4.98 MIL/uL (ref 3.87–5.11)
RDW: 13.3 % (ref 11.5–15.5)
WBC: 7.4 10*3/uL (ref 4.0–10.5)
nRBC: 0 % (ref 0.0–0.2)

## 2018-12-18 LAB — MAGNESIUM: Magnesium: 1.6 mg/dL — ABNORMAL LOW (ref 1.7–2.4)

## 2018-12-18 MED ORDER — MAGNESIUM SULFATE 2 GM/50ML IV SOLN
2.0000 g | Freq: Once | INTRAVENOUS | Status: AC
  Administered 2018-12-18: 2 g via INTRAVENOUS
  Filled 2018-12-18 (×2): qty 50

## 2018-12-18 NOTE — Progress Notes (Signed)
ANTICOAGULATION CONSULT NOTE - Initial Consult  Pharmacy Consult for Lovenox  Indication: VTE treatment;  On Xarelto PTA for h/o lower leg DVT x2  Allergies  Allergen Reactions  . Codeine Nausea And Vomiting  . Ambien [Zolpidem Tartrate] Other (See Comments)    Up sleep walking and eating  . Crestor [Rosuvastatin] Other (See Comments)    Muscle weakness, cramps and aching all over body  . Lipitor [Atorvastatin] Itching and Other (See Comments)    Muscle weakness, cramps and aches all over body  . Morphine And Related Other (See Comments)    Flushing and feeling hot Tolerates with Diphenhydramine  . Penicillins Rash and Other (See Comments)    Caused Headaches also Has patient had a PCN reaction causing immediate rash, facial/tongue/throat swelling, SOB or lightheadedness with hypotension: No Has patient had a PCN reaction causing severe rash involving mucus membranes or skin necrosis: No Has patient had a PCN reaction that required hospitalization No Has patient had a PCN reaction occurring within the last 10 years: No If all of the above answers are "NO", then may proceed with Cephalosporin use.    . Statins Other (See Comments)    Muscle weakness, cramps and aching all over body  . Dilaudid [Hydromorphone Hcl] Other (See Comments)    Hallucinations/ argumentative, goes beserk  . Heparin Other (See Comments)    HIT ab positive  (SRA negative 11/25/17)   . Amitriptyline Other (See Comments)    Loopy feeling  . Ceftin [Cefuroxime Axetil] Other (See Comments)    Stomach pain   . Lovaza [Omega-3-Acid Ethyl Esters] Other (See Comments)    Stomach pain   . Lunesta [Eszopiclone] Other (See Comments)    Causes dizziness  . Lyrica [Pregabalin] Nausea Only  . Metformin And Related Nausea Only    Patient Measurements: Height: 5\' 3"  (160 cm)(from 05/15/2018 records) Weight: 208 lb 8.9 oz (94.6 kg) IBW/kg (Calculated) : 52.4   Vital Signs:Temp: 98.1 F (36.7 C) (05/12 0749) Temp  Source: Oral (05/12 0749) BP: 135/70 (05/12 0749) Pulse Rate: 108 (05/12 0749)  Labs: Recent Labs    12/15/18 1521 12/16/18 0347 12/17/18 0625 12/18/18 0318  HGB 16.2* 14.9 14.5 13.8  HCT 48.2* 44.2 43.9 42.6  PLT 346 320 292 288  CREATININE 1.09* 0.91 1.00 0.98  TROPONINI <0.03  --   --   --     Estimated Creatinine Clearance: 60.1 mL/min (by C-G formula based on SCr of 0.98 mg/dL).   Medical History: Past Medical History:  Diagnosis Date  . Anemia    iron deficiency  . Arthritis   . Blood transfusion   . Cervical spondylosis with myelopathy and radiculopathy   . Depression   . Diabetes mellitus    Type II  . Diastolic dysfunction   . Diverticulitis   . DJD (degenerative joint disease)   . DVT (deep venous thrombosis) (HCC)    times 2 lower leg  . Endometriosis   . Family history of adverse reaction to anesthesia    mom and dad PONV  . Fibromyalgia   . GERD (gastroesophageal reflux disease)    occ  . Hiatal hernia   . History of kidney stones   . Hyperlipidemia   . Hypertension   . Hypothyroidism   . Insomnia   . Mild aortic insufficiency    by echo 04/2013  . Osteoarthritis    back and knee  . Ovarian cyst   . PONV (postoperative nausea and vomiting)   .  RLS (restless legs syndrome)   . Thoracic compression fracture ([place])   . Uterine fibroid   . UTI (lower urinary tract infection)   . Villous adenoma of right colon 04/08/2016  . Vitamin D deficiency disease     Medications:  Medications Prior to Admission  Medication Sig Dispense Refill Last Dose  . acetaminophen (TYLENOL) 500 MG tablet Take 1,000 mg by mouth every 8 (eight) hours as needed for mild pain or headache.   12/14/2018 at Unknown time  . amLODipine (NORVASC) 2.5 MG tablet Take 2.5 mg by mouth daily.   12/14/2018 at am  . aspirin EC 81 MG tablet Take 81 mg by mouth daily.   12/14/2018 at am  . clobetasol (TEMOVATE) 0.05 % external solution Apply 1 application topically See admin instructions.  Apply twice daily to scalp and ear as needed for itching/rash (do not apply to face/groin/underarms)  3 unknown  . Cyanocobalamin (B-12) 2500 MCG TABS Take 2,500 mcg by mouth daily.   12/14/2018 at am  . cyclobenzaprine (FLEXERIL) 10 MG tablet Take 1 tablet (10 mg total) by mouth 3 (three) times daily as needed for muscle spasms. 30 tablet 0 12/14/2018 at Unknown time  . dexlansoprazole (DEXILANT) 60 MG capsule Take 60 mg by mouth daily.   12/14/2018 at am  . docusate sodium (COLACE) 100 MG capsule Take 1 capsule (100 mg total) by mouth 2 (two) times daily. 60 capsule 0 12/14/2018 at pm  . DULoxetine (CYMBALTA) 60 MG capsule Take 60 mg by mouth daily.    12/14/2018 at am  . ferrous sulfate 325 (65 FE) MG tablet Take 1 tablet (325 mg total) by mouth 2 (two) times daily with a meal. 60 tablet 3 12/14/2018 at pm  . fluticasone (FLONASE) 50 MCG/ACT nasal spray Place 2 sprays into both nostrils daily as needed for allergies or rhinitis.   12/14/2018 at Unknown time  . furosemide (LASIX) 20 MG tablet Take 20 mg by mouth daily as needed for fluid or edema.    few days ago  . gabapentin (NEURONTIN) 300 MG capsule Take 600 mg by mouth at bedtime.   1 12/14/2018 at pm  . ibuprofen (ADVIL) 200 MG tablet Take 400 mg by mouth every 6 (six) hours as needed for headache (pain).   few days ago  . insulin degludec (TRESIBA FLEXTOUCH) 100 UNIT/ML SOPN FlexTouch Pen Inject 40 Units into the skin daily.   12/15/2018 at 1400  . insulin lispro (HUMALOG KWIKPEN) 100 UNIT/ML KwikPen Inject 14 Units into the skin 3 (three) times daily.   12/15/2018 at 1400  . irbesartan (AVAPRO) 75 MG tablet Take 75 mg by mouth daily.   12/14/2018 at am  . levothyroxine (SYNTHROID, LEVOTHROID) 150 MCG tablet Take 150 mcg by mouth daily before breakfast.  5 12/14/2018 at am  . LORazepam (ATIVAN) 0.5 MG tablet Take 1 tablet (0.5 mg total) by mouth every 8 (eight) hours as needed for anxiety. 30 tablet 0 2-3 months ago  . meclizine (ANTIVERT) 12.5 MG tablet Take 25 mg  by mouth 3 (three) times daily as needed for dizziness.   12/15/2018 at afternoon  . Pitavastatin Calcium (LIVALO) 2 MG TABS Take 2 mg by mouth every Wednesday.   4/29 or 5/6  . Probiotic Product (PROBIOTIC PO) Take 1 capsule by mouth daily.   few days ago at am  . promethazine (PHENERGAN) 25 MG tablet Take 25 mg by mouth every 8 (eight) hours as needed for nausea or vomiting.  12/15/2018 at afternoon  . rivaroxaban (XARELTO) 20 MG TABS tablet Take 20 mg by mouth daily.   12/14/2018 at 8:30-10am  . temazepam (RESTORIL) 15 MG capsule Take 15 mg by mouth at bedtime.   4 12/14/2018 at pm  . insulin aspart protamine- aspart (NOVOLOG MIX 70/30) (70-30) 100 UNIT/ML injection Inject 0.38 mLs (38 Units total) into the skin 2 (two) times daily with a meal. (Patient not taking: Reported on 12/15/2018) 10 mL 11 Not Taking at Unknown time  . oxyCODONE (OXY IR/ROXICODONE) 5 MG immediate release tablet Take 1 tablet (5 mg total) by mouth every 4 (four) hours as needed for moderate pain ((score 4 to 6)). (Patient not taking: Reported on 12/15/2018) 30 tablet 0 Not Taking at Unknown time  . oxyCODONE-acetaminophen (PERCOCET/ROXICET) 5-325 MG tablet Take 1 tablet by mouth every 6 (six) hours as needed (pain).       Scheduled:  . enoxaparin (LOVENOX) injection  1 mg/kg Subcutaneous Q12H  . insulin aspart  0-15 Units Subcutaneous Q4H  . insulin glargine  20 Units Subcutaneous Q2200    Assessment: 68 y.o female patient who was on Xarelto PTA for h/o lower leg DVT x2,  H/o HIT Ab positive 11/23/2017  ( SRA negative 11/25/17)  pt to ED 5/9 with LLQ abd pain, N/V.  CT scan revealed SBO. Plan NG tube decompression, IVFs.  No surgery needed at this time.   Wt 94 kg,  crcl 60 ml/min pltc 288 k, H/H 13.8/42.6  Pta Xarelto 20mg  daily , LD taken 5/8 @  0830-10 AM.   Goal of Therapy:  Monitor platelets by anticoagulation protocol: Yes   Plan:  Continue Lovenox 90 mg SQ q12h  F/u for restart of PTA Xarelto   Setsuko Robins A. Levada Dy,  PharmD, [place] Please utilize Amion for appropriate phone number to reach the unit pharmacist ([place])   12/18/2018,8:11 AM

## 2018-12-18 NOTE — Progress Notes (Signed)
PROGRESS NOTE  [name] ZDG:[id number] DOB: 1950-08-31 DOA: 12/15/2018 PCP: Harlan Stains, MD   LOS: 3 days   Brief narrative: [name] is a 68 y.o. female with past medical history of diabetes mellitus currently on insulin, GERD, hypertension, hyperlipidemia, hypothyroidism, history of stroke, diastolic dysfunction, fibromyalgia, prior history of DVT on Xarelto and multiple abdominal surgeries presented to the hospital with complaints of abdominal pain for 1 day associated with nausea and multiple episodes of vomiting, consideration for few days.  She tried MiraLAX and Dulcolax without relief.   In the ED, patient had a CT scan of the abdomen and pelvis which showed surgical changes in the right midabdomen with changes consistent with adhesions and at least a partial distal small bowel obstruction.  Surgery was consulted from the ED and was advised NG tube, IV fluids and conservative management.    Subjective: Patient still complains of  intermittent abdominal pain colicky in nature.  Has mild nausea but no vomiting.  Passed some flatus without a bowel movement.  No fever, chills, cough or shortness of breath.  Assessment/Plan:   Principal Problem:   Partial bowel obstruction (HCC) Active Problems:   Type II diabetes mellitus (HCC)   Hypothyroidism   Hypertension   Acute kidney injury ([place])   Diastolic dysfunction  Partial small bowel obstruction likely secondary to adhesions.  Persistent abdominal pain.  Surgery on board.  There is a possibility of needing surgery if her condition does not improve.   Continue n.p.o., IV fluids, IV antiemetics, IV analgesics as needed for pain.  Continue NG tube oxygen, continue to replenish electrolytes as necessary.  Surgery on board.  Continue ambulation.  Spoke with general surgery  Mild leukocytosis likely reactive.  CBC temp  Diabetes mellitus type 2 on insulin hyperglycemia on presentation.  Continue IV fluid hydration, Continue on  sliding scale insulin and basal insulin - decreased dose.  Glycemic control is adequate so far.  Will closely watch for hypoglycemia.  History of hypothyroidism.  Hold oral Synthroid for now.  Consider IV Synthroid if unable to take p.o. in 2 to 3 days.  Mild acute kidney injury secondary to volume depletion and bowel obstruction.  Improved with IV hydration.  Continue to hold Lasix  History of diastolic dysfunction.    Still mildly volume depleted.  Continue IV fluid hydration.  Compensated at this time.  Essential hypertension.  Accelerated. Will closely monitor.    PRN hydralazine with improvement in her blood pressure.  Blood pressure is likely secondary to ongoing pain.    GERD.  Continue with Protonix.  History of fibromyalgia.  On gabapentin.  Will hold at this time.  History of stroke.  Hold oral medications for now.  DVT.  On Xarelto as outpatient.  Therapeutic Lovenox here in the hospital  Consultant: General surgery  Code Status: Full code  DVT Prophylaxis: Lovenox  Antibiotics: None  Family Communication: None  Disposition Plan: Home likely in 2 to 3 days, follow surgical recommendations.  Consultants:  General surgery   Procedures:  None  Antibiotics: Anti-infectives (From admission, onward)   None      Objective: Vitals:   12/18/18 0510 12/18/18 0749  BP: (!) 141/74 135/70  Pulse: (!) 113 (!) 108  Resp: 19 18  Temp: 98.6 F (37 C) 98.1 F (36.7 C)  SpO2: 91% 96%    Intake/Output Summary (Last 24 hours) at 12/18/2018 0902 Last data filed at 12/18/2018 0754 Gross per 24 hour  Intake 500 ml  Output 1950 ml  Net -1450 ml   Filed Weights   12/16/18 0329 12/17/18 0615 12/17/18 2008  Weight: 92.4 kg 94.6 kg 94.6 kg   Body mass index is 36.94 kg/m.   Physical Exam: General: Morbidly obese, in moderate respiratory distress.  NG tube in place HENT: Normocephalic, pupils equally reacting to light and accommodation.  No scleral  pallor or icterus noted. Oral mucosa is dry. Chest:  Clear breath sounds.  Diminished breath sounds bilaterally. No crackles or wheezes.  CVS: S1 &S2 heard. No murmur.  Regular rate and rhythm. Abdomen: Soft, mild nonspecific tenderness.  Obese abdomen.  Previous surgical scar noted..  Bowel sounds are heard.  Liver is not palpable, no abdominal mass palpated Extremities: No cyanosis, clubbing or edema.  Peripheral pulses are palpable. Psych: Alert, awake and oriented, normal mood CNS:  No cranial nerve deficits.  Power equal in all extremities.  No sensory deficits noted.  No cerebellar signs.   Skin: Warm and dry.  No rashes noted.  Data Review: I have personally reviewed the following laboratory data and studies,  CBC: Recent Labs  Lab 12/15/18 1521 12/16/18 0637 12/17/18 0625 12/18/18 0318  WBC 13.3* 11.4* 8.7 7.4  NEUTROABS 11.3*  --   --   --   HGB 16.2* 14.9 14.5 13.8  HCT 48.2* 44.2 43.9 42.6  MCV 81.7 83.4 84.9 85.5  PLT 346 320 292 914   Basic Metabolic Panel: Recent Labs  Lab 12/15/18 1521 12/16/18 0637 12/17/18 0625 12/18/18 0318  NA 134* 140 141 142  K 4.4 4.2 4.1 3.5  CL 100 103 105 113*  CO2 19* 24 24 21*  GLUCOSE 429* 145* 200* 157*  BUN 13 14 15 15   CREATININE 1.09* 0.91 1.00 0.98  CALCIUM 9.5 9.1 8.9 8.4*  MG  --  1.9 1.9 1.6*  PHOS  --  4.5 3.5 3.1   Liver Function Tests: Recent Labs  Lab 12/15/18 1521 12/16/18 0637  AST 15 12*  ALT 14 12  ALKPHOS 122 114  BILITOT 1.1 0.8  PROT 7.1 6.5  ALBUMIN 3.9 3.6   Recent Labs  Lab 12/15/18 1521  LIPASE 22   No results for input(s): AMMONIA in the last 168 hours. Cardiac Enzymes: Recent Labs  Lab 12/15/18 1521  TROPONINI <0.03   BNP (last 3 results) No results for input(s): BNP in the last 8760 hours.  ProBNP (last 3 results) No results for input(s): PROBNP in the last 8760 hours.  CBG: Recent Labs  Lab 12/17/18 1601 12/17/18 2006 12/18/18 0008 12/18/18 0416 12/18/18 0738    GLUCAP 83 136* 138* 111* 133*   Recent Results (from the past 240 hour(s))  SARS Coronavirus 2 (CEPHEID - Performed in [place] hospital lab), Hosp Order     Status: None   Collection Time: 12/15/18  3:21 PM  Result Value Ref Range Status   SARS Coronavirus 2 NEGATIVE NEGATIVE Final    Comment: (NOTE) If result is NEGATIVE SARS-CoV-2 target nucleic acids are NOT DETECTED. The SARS-CoV-2 RNA is generally detectable in upper and lower  respiratory specimens during the acute phase of infection. The lowest  concentration of SARS-CoV-2 viral copies this assay can detect is 250  copies / mL. A negative result does not preclude SARS-CoV-2 infection  and should not be used as the sole basis for treatment or other  patient management decisions.  A negative result may occur with  improper specimen collection / handling, submission of specimen other  than  nasopharyngeal swab, presence of viral mutation(s) within the  areas targeted by this assay, and inadequate number of viral copies  (<250 copies / mL). A negative result must be combined with clinical  observations, patient history, and epidemiological information. If result is POSITIVE SARS-CoV-2 target nucleic acids are DETECTED. The SARS-CoV-2 RNA is generally detectable in upper and lower  respiratory specimens dur ing the acute phase of infection.  Positive  results are indicative of active infection with SARS-CoV-2.  Clinical  correlation with patient history and other diagnostic information is  necessary to determine patient infection status.  Positive results do  not rule out bacterial infection or co-infection with other viruses. If result is PRESUMPTIVE POSTIVE SARS-CoV-2 nucleic acids MAY BE PRESENT.   A presumptive positive result was obtained on the submitted specimen  and confirmed on repeat testing.  While 2019 novel coronavirus  (SARS-CoV-2) nucleic acids may be present in the submitted sample  additional confirmatory  testing may be necessary for epidemiological  and / or clinical management purposes  to differentiate between  SARS-CoV-2 and other Sarbecovirus currently known to infect humans.  If clinically indicated additional testing with an alternate test  methodology [id number]) is advised. The SARS-CoV-2 RNA is generally  detectable in upper and lower respiratory sp ecimens during the acute  phase of infection. The expected result is Negative. Fact Sheet for Patients:  StrictlyIdeas.no Fact Sheet for Healthcare Providers: BankingDealers.co.za This test is not yet approved or cleared by the [place] FDA and has been authorized for detection and/or diagnosis of SARS-CoV-2 by FDA under an Emergency Use Authorization (EUA).  This EUA will remain in effect (meaning this test can be used) for the duration of the COVID-19 declaration under Section 564(b)(1) of the Act, 21 U.S.C. section 360bbb-3(b)(1), unless the authorization is terminated or revoked sooner. Performed at [facility], [place] [address] [place],  [address]      Studies: Dg Abd 2 Views  Result Date: 12/18/2018 CLINICAL DATA:  Right-sided abdominal pain. Small-bowel obstruction. EXAM: ABDOMEN - 2 VIEW COMPARISON:  12/17/2018; 12/16/2018; CT abdomen pelvis-12/15/2018 FINDINGS: Enteric tube tip and side port projected the expected location of the mid body of the stomach. Continued slow transit ingested contrast now seen to the level of the descending colon, however there is persistent contrast opacification of distended loops distal small bowel with index loop measuring approximately 4 cm in diameter. No pneumoperitoneum, pneumatosis or portal venous gas. Limited visualization of lower thorax demonstrates minimal left basilar atelectasis. Post cholecystectomy. Post lumbar paraspinal fusion intervertebral disc space replacement, incompletely evaluated. Stigmata DISH within the lower  thoracic spine. IMPRESSION: Findings most suggestive of minimally improved partial small bowel obstruction. Electronically Signed   By: Sandi Mariscal M.D.   On: 12/18/2018 08:29   Dg Abd 2 Views  Result Date: 12/17/2018 CLINICAL DATA:  68 year old female with abdominal pain, nausea and shortness of breath since Saturday EXAM: ABDOMEN - 2 VIEW COMPARISON:  Prior chest x-ray 12/16/2018 FINDINGS: Left lateral decubitus radiograph demonstrates no evidence of free air. There are multiple air-fluid levels within both small bowel and colon. The gastric tube projects over the stomach. Surgical clips in the right upper quadrant consistent with prior cholecystectomy. Evidence of prior extensive lumbar spine surgery including L2-L4 posterior lumbar interbody fusion with an interbody cage at L2-L3 and successful ankylosis with interbody grafts at L3-L4, L4-L5 and L5-S1. IMPRESSION: 1. Multiple air-fluid levels within both large and small bowel on the lateral decubitus view suggest persistent at  least partial small bowel obstruction. 2. No evidence of free air. 3. Gastric tube is well positioned. Electronically Signed   By: Jacqulynn Cadet M.D.   On: 12/17/2018 07:51    Scheduled Meds:  enoxaparin (LOVENOX) injection  1 mg/kg Subcutaneous Q12H   insulin aspart  0-15 Units Subcutaneous Q4H   insulin glargine  20 Units Subcutaneous Q2200    Continuous Infusions:  sodium chloride 150 mL/hr at 12/18/18 0157   magnesium sulfate bolus IVPB       Flora Lipps, MD  [facility] 12/18/2018

## 2018-12-18 NOTE — Progress Notes (Signed)
Initial Nutrition Assessment  DOCUMENTATION CODES:   Obesity unspecified  INTERVENTION:   Monitor for diet advancement/toleration   Boost Breeze po TID, each supplement provides 250 kcal and 9 grams of protein once diet advanced to clear  If diet unable to advance in the next few days consider nutrition support.   NUTRITION DIAGNOSIS:   Inadequate oral intake related to inability to eat as evidenced by NPO status.  GOAL:   Patient will meet greater than or equal to 90% of their needs  MONITOR:   Diet advancement, Weight trends, Labs, Skin, I & O's  REASON FOR ASSESSMENT:   Malnutrition Screening Tool    ASSESSMENT:   Patient with PMH significant for DM, GERD, HTN, HLD, CVA, DVT, diverticulitis, s/p appendectomy, s/p open cholecystectomy, and s/p laparoscopic right colectomy. Presents this admission with partial small bowel obstruction.    RD working remotely.  Per surgery, plan to continue conservative management with NGT to LIWS. If not improved in coming days plan for exploration. Pt day 3 NPO. Xray this am showed minimal improvements.   Spoke with pt via phone. Pt endorses having a great appetite PTA. Typically eats 2-3 meals daily that consist of: B- cereal, eggs, toast L- peanut butter sandwich D- meat with grain. Reports her doctor told her not to eat fresh vegetables like carrots or lettuce and to stick to a soft diet. She is very eager to eat this admission and wants something other than ice chips. Discussed plan and diet progression per MD. RD to provide supplements once diet advanced. If diet unable to advance in the next few days consider nutrition support.   No BM reported since 5/5. Will monitor for results.   Pt reports a UBW of 200 lb and denies any recent wt loss. Per chart records pt has maintained her weight of 203 lb over the last year.   NGT: 550 ml x 24 hrs   Drips: 2 g Mg sulfate, NS @ 150 ml/hr  Medications: SS novolog, Lantus,  Labs: Mg 1.6  (L) CBG 83-138  Diet Order:   Diet Order            Diet NPO time specified Except for: Ice Chips  Diet effective now              EDUCATION NEEDS:   Education needs have been addressed  Skin:  Skin Assessment: Reviewed RN Assessment  Last BM:  5/5   Height:   Ht Readings from Last 1 Encounters:  12/15/18 5\' 3"  (1.6 m)    Weight:   Wt Readings from Last 1 Encounters:  12/17/18 94.6 kg    Ideal Body Weight:  52.3 kg  BMI:  Body mass index is 36.94 kg/m.  Estimated Nutritional Needs:   Kcal:  1600-1800 kcal  Protein:  80-95 grams  Fluid:  >/= 1.6 L/day   Mariana Single RD, LDN Clinical Nutrition Pager # - [phone]

## 2018-12-18 NOTE — Progress Notes (Signed)
PT Cancellation Note  Patient Details Name: [name] MRN: [id number] DOB: [birth year]   Cancelled Treatment:    Reason Eval/Treat Not Completed: Other (comment)   Significant nausea after walking in room and having a bath;  And she is politely and apologetically declining to walk at this time.  Will follow up later today as time allows;  Otherwise, will follow up for PT tomorrow;   Thank you,  Roney Marion, PT  Acute Rehabilitation Services Pager [phone] Office [place] 12/18/2018, 1:31 PM

## 2018-12-18 NOTE — Progress Notes (Signed)
[facility] Progress Note     Subjective: CC-  Patient reports that her abdominal pain is more of discomfort and similar/mildly improved relative to yesterday. States that it comes in waves. She reports nausea, no emesis. Bloating about the same. 550cc from NG tube last 24 hours. Continues to pass flatus - last was this morning. No BM as of yet  WBC remains normal; afebrile  Objective: Vital signs in last 24 hours: Temp:  [97.7 F (36.5 C)-98.6 F (37 C)] 98.6 F (37 C) (05/12 0510) Pulse Rate:  [89-120] 113 (05/12 0510) Resp:  [18-20] 19 (05/12 0510) BP: (141-205)/(74-99) 141/74 (05/12 0510) SpO2:  [91 %-100 %] 91 % (05/12 0510) Weight:  [94.6 kg] 94.6 kg (05/11 2008) Last BM Date: 12/11/18  Intake/Output from previous day: 05/11 0701 - 05/12 0700 In: 500 [NG/GT:500] Out: 1550 [Urine:1000; Emesis/NG output:550] Intake/Output this shift: No intake/output data recorded.  PE: Gen:  Alert, NAD HEENT: EOM's intact, pupils equal and round Card:  tachy Pulm:  CTAB, no W/R/R, effort normal Abd: Soft, mild distension, hypoactive BS, well healed midline incision, mild LUQ TTP; no rebound or guarding. No tenderness laterally Ext:  Calves soft and nontender Psych: A&Ox3  Skin: no rashes noted, warm and dry  Lab Results:  Recent Labs    12/17/18 0625 12/18/18 0318  WBC 8.7 7.4  HGB 14.5 13.8  HCT 43.9 42.6  PLT 292 288   BMET Recent Labs    12/17/18 0625 12/18/18 0318  NA 141 142  K 4.1 3.5  CL 105 113*  CO2 24 21*  GLUCOSE 200* 157*  BUN 15 15  CREATININE 1.00 0.98  CALCIUM 8.9 8.4*   PT/INR No results for input(s): LABPROT, INR in the last 72 hours. CMP     Component Value Date/Time   NA 142 12/18/2018 0318   K 3.5 12/18/2018 0318   CL 113 (H) 12/18/2018 0318   CO2 21 (L) 12/18/2018 0318   GLUCOSE 157 (H) 12/18/2018 0318   BUN 15 12/18/2018 0318   CREATININE 0.98 12/18/2018 0318   CALCIUM 8.4 (L) 12/18/2018 0318   PROT 6.5 12/16/2018  0637   ALBUMIN 3.6 12/16/2018 0637   AST 12 (L) 12/16/2018 0637   ALT 12 12/16/2018 0637   ALKPHOS 114 12/16/2018 0637   BILITOT 0.8 12/16/2018 0637   GFRNONAA 59 (L) 12/18/2018 0318   GFRAA >60 12/18/2018 0318   Lipase     Component Value Date/Time   LIPASE 22 12/15/2018 1521       Studies/Results: Dg Abd 2 Views  Result Date: 12/17/2018 CLINICAL DATA:  68 year old female with abdominal pain, nausea and shortness of breath since Saturday EXAM: ABDOMEN - 2 VIEW COMPARISON:  Prior chest x-ray 12/16/2018 FINDINGS: Left lateral decubitus radiograph demonstrates no evidence of free air. There are multiple air-fluid levels within both small bowel and colon. The gastric tube projects over the stomach. Surgical clips in the right upper quadrant consistent with prior cholecystectomy. Evidence of prior extensive lumbar spine surgery including L2-L4 posterior lumbar interbody fusion with an interbody cage at L2-L3 and successful ankylosis with interbody grafts at L3-L4, L4-L5 and L5-S1. IMPRESSION: 1. Multiple air-fluid levels within both large and small bowel on the lateral decubitus view suggest persistent at least partial small bowel obstruction. 2. No evidence of free air. 3. Gastric tube is well positioned. Electronically Signed   By: Jacqulynn Cadet M.D.   On: 12/17/2018 07:51   Dg Abd Portable 1v-small Bowel Obstruction Protocol-initial, 8  Hr Delay  Result Date: 12/16/2018 CLINICAL DATA:  Lower abdominal pain. EXAM: PORTABLE ABDOMEN - 1 VIEW COMPARISON:  Radiograph of Dec 15, 2018. FINDINGS: Mildly dilated small bowel loops are noted centrally concerning for possible small bowel obstruction. Distal tip of nasogastric tube is seen in expected position of distal stomach. Status post cholecystectomy. IMPRESSION: Mildly dilated small bowel loops are noted concerning for possible distal small bowel obstruction. Nasogastric tube tip seen in expected position of distal stomach. Electronically Signed    By: Marijo Conception M.D.   On: 12/16/2018 08:11    Anti-infectives: Anti-infectives (From admission, onward)   None       Assessment/Plan HTN DM-II Diastolic dysfunction Hypothyroidism Fibromyalgia H/o stroke Obestity H/o DVT on Xarelto at home (last dose 5/8) - on therapeutic lovenox this admission AKI - Cr 1, resolved  SBO - h/o tubal ligation, appendectomy, c section x2, open cholecystectomy, laparoscopic right colectomy for villous adenoma 04/08/2016 - Xray today pending. Continue nonoperative management for now with NPO/NGT to LIWS. Mobilize. If not improving in coming days, discussed that would likely need exploration  ID - none FEN - IVF, NPO/NGT  VTE - lovenox 90mg  BID Foley - none Follow up - TBD   LOS: 3 days    Ileana Roup, [facility] 12/18/2018, 7:39 AM

## 2018-12-18 NOTE — Progress Notes (Signed)
PROGRESS NOTE  [name] ION:[id number] DOB: 11/02/1950 DOA: 12/15/2018 PCP: Harlan Stains, MD   LOS: 3 days   Brief narrative: [name] is a 68 y.o. female with past medical history of diabetes mellitus currently on insulin, GERD, hypertension, hyperlipidemia, hypothyroidism, history of stroke, diastolic dysfunction, fibromyalgia, prior history of DVT on Xarelto and multiple abdominal surgeries presented to the hospital with complaints of abdominal pain for 1 day associated with nausea and multiple episodes of vomiting, consideration for few days.  She tried MiraLAX and Dulcolax without relief.   In the ED, patient had a CT scan of the abdomen and pelvis which showed surgical changes in the right midabdomen with changes consistent with adhesions and at least a partial distal small bowel obstruction.  Surgery was consulted from the ED and was advised NG tube, IV fluids and conservative management.    Subjective:  Patient still complains of intermittent abdominal pain little less today. Continue to have NG bute output. Passing flatus.     Assessment/Plan:  Principal Problem:   Partial bowel obstruction (HCC) Active Problems:   Type II diabetes mellitus (HCC)   Hypothyroidism   Hypertension   Acute kidney injury ([place])   Diastolic dysfunction  Partial small bowel obstruction likely secondary to adhesions.   Surgery on board. Abdominal xray with minimal improvement in partial small bowel obstruction.   Continue n.p.o., IV fluids, IV antiemetics, IV analgesics as needed for pain.  Continue NG tube, oxygen, continue to replenish electrolytes as necessary.  Continue ambulation.    Mild leukocytosis likely reactive.   Diabetes mellitus type 2 on insulin hyperglycemia on presentation.  Continue IV fluid hydration. Continue on sliding scale insulin and basal insulin - decreased dose.  Glycemic control is adequate so far.  Will closely watch for hypoglycemia.  History of  hypothyroidism.  Hold oral Synthroid for now.  Consider IV Synthroid if unable to take p.o. in 2 to 3 days.  Mild acute kidney injury secondary to volume depletion and bowel obstruction.  Improved with IV hydration.  Continue to hold Lasix.  History of diastolic dysfunction.      Continue IV fluid hydration.  Compensated at this time.  Essential hypertension. Will closely monitor.    PRN hydralazine with improvement in her blood pressure.  GERD.  Continue with Protonix.  History of fibromyalgia.  On gabapentin.  Will hold at this time.   History of stroke.  Hold oral medications for now.  DVT.  On Xarelto as outpatient.  Therapeutic Lovenox here in the hospital  Consultant: General surgery  Code Status: Full code  DVT Prophylaxis: Lovenox  Antibiotics: None  Family Communication: None  Disposition Plan: Home likely in 2 to 3 days depending on clinical progression, follow surgical recommendations  Consultants:  General surgery   Procedures:  None  Antibiotics: Anti-infectives (From admission, onward)   None      Objective: Vitals:   12/18/18 0510 12/18/18 0749  BP: (!) 141/74 135/70  Pulse: (!) 113 (!) 108  Resp: 19 18  Temp: 98.6 F (37 C) 98.1 F (36.7 C)  SpO2: 91% 96%    Intake/Output Summary (Last 24 hours) at 12/18/2018 1332 Last data filed at 12/18/2018 0754 Gross per 24 hour  Intake 500 ml  Output 1350 ml  Net -850 ml   Filed Weights   12/16/18 0329 12/17/18 0615 12/17/18 2008  Weight: 92.4 kg 94.6 kg 94.6 kg   Body mass index is 36.94 kg/m.   Physical Exam: General:  Morbidly obese,  NG tube in place HENT: Normocephalic, pupils equally reacting to light and accommodation.  No scleral pallor or icterus noted. Oral mucosa is moist. Chest:  Clear breath sounds.  Diminished breath sounds bilaterally. No crackles or wheezes.  CVS: S1 &S2 heard. No murmur.  Regular rate and rhythm. Abdomen: Soft, nonspecific tenderness.  Obese  abdomen.  Previous surgical scar noted..  Bowel sounds are heard.  Liver is not palpable, no abdominal mass palpated Extremities: No cyanosis, clubbing or edema.  Peripheral pulses are palpable. Psych: Alert, awake and oriented, normal mood CNS:  No cranial nerve deficits.  Power equal in all extremities.  No sensory deficits noted.  No cerebellar signs.   Skin: Warm and dry.  No rashes noted.  Data Review: I have personally reviewed the following laboratory data and studies,  CBC: Recent Labs  Lab 12/15/18 1521 12/16/18 0637 12/17/18 0625 12/18/18 0318  WBC 13.3* 11.4* 8.7 7.4  NEUTROABS 11.3*  --   --   --   HGB 16.2* 14.9 14.5 13.8  HCT 48.2* 44.2 43.9 42.6  MCV 81.7 83.4 84.9 85.5  PLT 346 320 292 563   Basic Metabolic Panel: Recent Labs  Lab 12/15/18 1521 12/16/18 0637 12/17/18 0625 12/18/18 0318  NA 134* 140 141 142  K 4.4 4.2 4.1 3.5  CL 100 103 105 113*  CO2 19* 24 24 21*  GLUCOSE 429* 145* 200* 157*  BUN 13 14 15 15   CREATININE 1.09* 0.91 1.00 0.98  CALCIUM 9.5 9.1 8.9 8.4*  MG  --  1.9 1.9 1.6*  PHOS  --  4.5 3.5 3.1   Liver Function Tests: Recent Labs  Lab 12/15/18 1521 12/16/18 0637  AST 15 12*  ALT 14 12  ALKPHOS 122 114  BILITOT 1.1 0.8  PROT 7.1 6.5  ALBUMIN 3.9 3.6   Recent Labs  Lab 12/15/18 1521  LIPASE 22   No results for input(s): AMMONIA in the last 168 hours. Cardiac Enzymes: Recent Labs  Lab 12/15/18 1521  TROPONINI <0.03   BNP (last 3 results) No results for input(s): BNP in the last 8760 hours.  ProBNP (last 3 results) No results for input(s): PROBNP in the last 8760 hours.  CBG: Recent Labs  Lab 12/17/18 2006 12/18/18 0008 12/18/18 0416 12/18/18 0738 12/18/18 1137  GLUCAP 136* 138* 111* 133* 131*   Recent Results (from the past 240 hour(s))  SARS Coronavirus 2 (CEPHEID - Performed in [place] hospital lab), Hosp Order     Status: None   Collection Time: 12/15/18  3:21 PM  Result Value Ref Range Status    SARS Coronavirus 2 NEGATIVE NEGATIVE Final    Comment: (NOTE) If result is NEGATIVE SARS-CoV-2 target nucleic acids are NOT DETECTED. The SARS-CoV-2 RNA is generally detectable in upper and lower  respiratory specimens during the acute phase of infection. The lowest  concentration of SARS-CoV-2 viral copies this assay can detect is 250  copies / mL. A negative result does not preclude SARS-CoV-2 infection  and should not be used as the sole basis for treatment or other  patient management decisions.  A negative result may occur with  improper specimen collection / handling, submission of specimen other  than nasopharyngeal swab, presence of viral mutation(s) within the  areas targeted by this assay, and inadequate number of viral copies  (<250 copies / mL). A negative result must be combined with clinical  observations, patient history, and epidemiological information. If result is POSITIVE SARS-CoV-2 target  nucleic acids are DETECTED. The SARS-CoV-2 RNA is generally detectable in upper and lower  respiratory specimens dur ing the acute phase of infection.  Positive  results are indicative of active infection with SARS-CoV-2.  Clinical  correlation with patient history and other diagnostic information is  necessary to determine patient infection status.  Positive results do  not rule out bacterial infection or co-infection with other viruses. If result is PRESUMPTIVE POSTIVE SARS-CoV-2 nucleic acids MAY BE PRESENT.   A presumptive positive result was obtained on the submitted specimen  and confirmed on repeat testing.  While 2019 novel coronavirus  (SARS-CoV-2) nucleic acids may be present in the submitted sample  additional confirmatory testing may be necessary for epidemiological  and / or clinical management purposes  to differentiate between  SARS-CoV-2 and other Sarbecovirus currently known to infect humans.  If clinically indicated additional testing with an alternate test   methodology [phone]) is advised. The SARS-CoV-2 RNA is generally  detectable in upper and lower respiratory sp ecimens during the acute  phase of infection. The expected result is Negative. Fact Sheet for Patients:  StrictlyIdeas.no Fact Sheet for Healthcare Providers: BankingDealers.co.za This test is not yet approved or cleared by the [place] FDA and has been authorized for detection and/or diagnosis of SARS-CoV-2 by FDA under an Emergency Use Authorization (EUA).  This EUA will remain in effect (meaning this test can be used) for the duration of the COVID-19 declaration under Section 564(b)(1) of the Act, 21 U.S.C. section 360bbb-3(b)(1), unless the authorization is terminated or revoked sooner. Performed at [facility], [place] [address] [place], [place] [address]      Studies: Dg Abd 2 Views  Result Date: 12/18/2018 CLINICAL DATA:  Right-sided abdominal pain. Small-bowel obstruction. EXAM: ABDOMEN - 2 VIEW COMPARISON:  12/17/2018; 12/16/2018; CT abdomen pelvis-12/15/2018 FINDINGS: Enteric tube tip and side port projected the expected location of the mid body of the stomach. Continued slow transit ingested contrast now seen to the level of the descending colon, however there is persistent contrast opacification of distended loops distal small bowel with index loop measuring approximately 4 cm in diameter. No pneumoperitoneum, pneumatosis or portal venous gas. Limited visualization of lower thorax demonstrates minimal left basilar atelectasis. Post cholecystectomy. Post lumbar paraspinal fusion intervertebral disc space replacement, incompletely evaluated. Stigmata DISH within the lower thoracic spine. IMPRESSION: Findings most suggestive of minimally improved partial small bowel obstruction. Electronically Signed   By: Sandi Mariscal M.D.   On: 12/18/2018 08:29   Dg Abd 2 Views  Result Date: 12/17/2018 CLINICAL DATA:  68 year old  female with abdominal pain, nausea and shortness of breath since Saturday EXAM: ABDOMEN - 2 VIEW COMPARISON:  Prior chest x-ray 12/16/2018 FINDINGS: Left lateral decubitus radiograph demonstrates no evidence of free air. There are multiple air-fluid levels within both small bowel and colon. The gastric tube projects over the stomach. Surgical clips in the right upper quadrant consistent with prior cholecystectomy. Evidence of prior extensive lumbar spine surgery including L2-L4 posterior lumbar interbody fusion with an interbody cage at L2-L3 and successful ankylosis with interbody grafts at L3-L4, L4-L5 and L5-S1. IMPRESSION: 1. Multiple air-fluid levels within both large and small bowel on the lateral decubitus view suggest persistent at least partial small bowel obstruction. 2. No evidence of free air. 3. Gastric tube is well positioned. Electronically Signed   By: Jacqulynn Cadet M.D.   On: 12/17/2018 07:51    Scheduled Meds: . enoxaparin (LOVENOX) injection  1 mg/kg Subcutaneous Q12H  .  insulin aspart  0-15 Units Subcutaneous Q4H  . insulin glargine  20 Units Subcutaneous Q2200    Continuous Infusions: . sodium chloride 150 mL/hr at 12/18/18 0157  . magnesium sulfate bolus IVPB       Flora Lipps, MD  [facility] 12/18/2018

## 2018-12-18 NOTE — Progress Notes (Signed)
Physical Therapy Treatment Patient Details Name: [name] MRN: [id number] DOB: [birth year] Today's Date: 12/18/2018    History of Present Illness [name] is a 68 y.o. female with past medical history of diabetes mellitus currently on insulin, GERD, hypertension, hyperlipidemia, hypothyroidism, history of stroke, diastolic dysfunction, fibromyalgia and prior history of DVT on Xarelto, multiple abdominal surgeries presented to the hospital with complaints of abdominal pain; SBO initiating conservative small bowel protocol    PT Comments    Continuing work on functional mobility and activity tolerance;  Very good progress, with incr gait distance, and no increased dizziness or nausea at the end of the walk   Follow Up Recommendations  No PT follow up(I anticipate good recovery)     Equipment Recommendations       Recommendations for Other Services       Precautions / Restrictions Precautions Precaution Comments: Encourage self-monitoring for activity tolernace    Mobility  Bed Mobility   Bed Mobility: Supine to Sit     Supine to sit: Supervision     General bed mobility comments: Supervision for safety and management of lines  Transfers Overall transfer level: Needs assistance Equipment used: None Transfers: Sit to/from Stand Sit to Stand: Min guard         General transfer comment: Minguard for safety  Ambulation/Gait Ambulation/Gait assistance: Min guard Gait Distance (Feet): 200 Feet Assistive device: IV Pole(and occasional use of hallway rail) Gait Pattern/deviations: Step-through pattern     General Gait Details: Cues to self-monitor for activity tolerance   Stairs             Wheelchair Mobility    Modified Rankin (Stroke Patients Only)       Balance Overall balance assessment: Mild deficits observed, not formally tested                                          Cognition Arousal/Alertness:  Awake/alert Behavior During Therapy: WFL for tasks assessed/performed Overall Cognitive Status: Within Functional Limits for tasks assessed                                        Exercises      General Comments        Pertinent Vitals/Pain Pain Assessment: No/denies pain(but some nausea)    Home Living                      Prior Function            PT Goals (current goals can now be found in the care plan section) Acute Rehab PT Goals Patient Stated Goal: feel better and get home PT Goal Formulation: With patient Time For Goal Achievement: 12/30/18 Potential to Achieve Goals: Good Progress towards PT goals: Progressing toward goals    Frequency    Min 3X/week      PT Plan Current plan remains appropriate    Co-evaluation              AM-PAC PT "6 Clicks" Mobility   Outcome Measure  Help needed turning from your back to your side while in a flat bed without using bedrails?: None Help needed moving from lying on your back to sitting on the side of a flat bed without using bedrails?:  None Help needed moving to and from a bed to a chair (including a wheelchair)?: None Help needed standing up from a chair using your arms (e.g., wheelchair or bedside chair)?: None Help needed to walk in hospital room?: A Little Help needed climbing 3-5 steps with a railing? : A Little 6 Click Score: 22    End of Session   Activity Tolerance: Patient tolerated treatment well Patient left: in bed;with call bell/phone within reach Nurse Communication: Mobility status;Other (comment)(IV beeping) PT Visit Diagnosis: Unsteadiness on feet (R26.81);Other (comment)(decr activity tolerance)     Time: 8337-4451 PT Time Calculation (min) (ACUTE ONLY): 17 min  Charges:  $Gait Training: 8-22 mins                     Roney [place], [place]  Acute Rehabilitation Services Pager [phone] Office [phone]    [name] 12/18/2018, 4:06 PM

## 2018-12-18 NOTE — Plan of Care (Signed)
  Problem: Coping: Goal: Level of anxiety will decrease Outcome: Progressing   Problem: Pain Managment: Goal: General experience of comfort will improve Outcome: Progressing   Problem: Safety: Goal: Ability to remain free from injury will improve Outcome: Progressing   Problem: Skin Integrity: Goal: Risk for impaired skin integrity will decrease Outcome: Progressing   

## 2018-12-19 ENCOUNTER — Inpatient Hospital Stay (HOSPITAL_COMMUNITY): Payer: Medicare Other

## 2018-12-19 LAB — GLUCOSE, CAPILLARY
Glucose-Capillary: 119 mg/dL — ABNORMAL HIGH (ref 70–99)
Glucose-Capillary: 137 mg/dL — ABNORMAL HIGH (ref 70–99)
Glucose-Capillary: 89 mg/dL (ref 70–99)
Glucose-Capillary: 97 mg/dL (ref 70–99)
Glucose-Capillary: 98 mg/dL (ref 70–99)

## 2018-12-19 NOTE — Progress Notes (Signed)
Physical Therapy Treatment Patient Details Name: [name] MRN: [id number] DOB: 07-02-1951 Today's Date: 12/19/2018    History of Present Illness [name] is a 68 y.o. female with past medical history of diabetes mellitus currently on insulin, GERD, hypertension, hyperlipidemia, hypothyroidism, history of stroke, diastolic dysfunction, fibromyalgia and prior history of DVT on Xarelto, multiple abdominal surgeries presented to the hospital with complaints of abdominal pain; SBO initiating conservative small bowel protocol    PT Comments    Patient doing well toady ambulating hallway distances without external assistance. VSS on RA. Next visit to focus on no UE support from IV pole.   Follow Up Recommendations  No PT follow up(I anticipate good recovery)     Equipment Recommendations       Recommendations for Other Services       Precautions / Restrictions Precautions Precaution Comments: Encourage self-monitoring for activity tolernace    Mobility  Bed Mobility   Bed Mobility: Supine to Sit     Supine to sit: Supervision     General bed mobility comments: Supervision for safety and management of lines  Transfers Overall transfer level: Needs assistance Equipment used: None Transfers: Sit to/from Stand Sit to Stand: Min guard         General transfer comment: Minguard for safety  Ambulation/Gait Ambulation/Gait assistance: Min guard Gait Distance (Feet): 200 Feet Assistive device: IV Pole(and occasional use of hallway rail) Gait Pattern/deviations: Step-through pattern     General Gait Details: VSS, no overt LOB, mild unstediness holding onto IV pole   Stairs             Wheelchair Mobility    Modified Rankin (Stroke Patients Only)       Balance Overall balance assessment: Mild deficits observed, not formally tested                                          Cognition Arousal/Alertness: Awake/alert Behavior During  Therapy: WFL for tasks assessed/performed Overall Cognitive Status: Within Functional Limits for tasks assessed                                        Exercises      General Comments        Pertinent Vitals/Pain      Home Living                      Prior Function            PT Goals (current goals can now be found in the care plan section) Acute Rehab PT Goals Patient Stated Goal: feel better and get home PT Goal Formulation: With patient Time For Goal Achievement: 12/30/18 Potential to Achieve Goals: Good    Frequency    Min 3X/week      PT Plan Current plan remains appropriate    Co-evaluation              AM-PAC PT "6 Clicks" Mobility   Outcome Measure  Help needed turning from your back to your side while in a flat bed without using bedrails?: None Help needed moving from lying on your back to sitting on the side of a flat bed without using bedrails?: None Help needed moving to and from a bed to a  chair (including a wheelchair)?: None Help needed standing up from a chair using your arms (e.g., wheelchair or bedside chair)?: None Help needed to walk in hospital room?: A Little Help needed climbing 3-5 steps with a railing? : A Little 6 Click Score: 22    End of Session   Activity Tolerance: Patient tolerated treatment well Patient left: in bed;with call bell/phone within reach Nurse Communication: Mobility status;Other (comment)(IV beeping) PT Visit Diagnosis: Unsteadiness on feet (R26.81);Other (comment)(decr activity tolerance)     Time: 3736-6815 PT Time Calculation (min) (ACUTE ONLY): 15 min  Charges:  $Gait Training: 8-22 mins                     Reinaldo Berber, PT, DPT Acute Rehabilitation Services Pager: [phone] Office: [phone]     Reinaldo Berber 12/19/2018, 1:54 PM

## 2018-12-19 NOTE — Progress Notes (Signed)
PROGRESS NOTE  [name] AVW:[id number] DOB: 02-19-1951 DOA: 12/15/2018 PCP: [name], MD   LOS: 4 days   Brief narrative: [name] is a 68 y.o. female with past medical history of diabetes mellitus currently on insulin, GERD, hypertension, hyperlipidemia, hypothyroidism, history of stroke, diastolic dysfunction, fibromyalgia, prior history of DVT on Xarelto and multiple abdominal surgeries presented to the hospital with complaints of abdominal pain for 1 day associated with nausea and multiple episodes of vomiting, consideration for few days.  She tried MiraLAX and Dulcolax without relief.   In the ED, patient had a CT scan of the abdomen and pelvis which showed surgical changes in the right midabdomen with changes consistent with adhesions and at least a partial distal small bowel obstruction.  Surgery was consulted from the ED and was advised NG tube, IV fluids and conservative management.    Subjective:  Patient seen and examined at bedside, resting comfortably in bed.  States continues to pass flatus, no bowel movements as of yet.  Abdominal pain almost completely resolved.  No other complaints at this time.  Denies chest pain, no shortness of breath, no palpitations, no nausea/vomiting/diarrhea, no fever/chills/night sweats.  No acute events overnight per nursing staff.  Assessment/Plan:  Principal Problem:   Partial bowel obstruction (HCC) Active Problems:   Type II diabetes mellitus (HCC)   Hypothyroidism   Hypertension   Acute kidney injury (HCC)   Diastolic dysfunction  Partial small bowel obstruction likely secondary to adhesions.    CT abdomen/pelvis notable for postsurgical changes right midabdomen consistent with adhesions and at least partial distal small bowel obstruction. --Neurosurgery following, appreciate assistance --KUB this morning shows normalize gas pattern, resolved small bowel obstruction --Continues with NG tube to low wall suction, IV  fluids/antiemetics --Encourage continued mobilization/ambulation --Surgery plans likely clamp NG tube and clear liquid diet tomorrow if continues to progress clinically  Mild leukocytosis: Resolved Count on admission 13.3.  Likely reactive.  Now normalized.  Diabetes mellitus type 2  Tresiba 40 units sq daily and Humalog 14 units 3 times daily AC.  Last Hemoglobin A1c 8.2 on 05/16/2018. --Continue reduced dose basal insulin with Lantus 20 units qHS --NovoLog insulin sliding scale for coverage --IV fluid hydration --CBG q4hs while NPO --Monitor for hypoglycemia  History of hypothyroidism.   Hold oral Synthroid for now.  Consider IV Synthroid if unable to take p.o. in 2 to 3 days.  History of diastolic dysfunction.       Continue IV fluid hydration.  Compensated at this time.  Holding home Lasix.  Essential hypertension.  Will closely monitor.  PRN hydralazine with improvement in her blood pressure.  GERD:  Continue Protonix.  History of fibromyalgia:  On gabapentin at home.  Will hold at this time.   History of stroke:  Hold oral medications for now.  DVT:  On Xarelto as outpatient.  Therapeutic Lovenox here in the hospital  Consultant: General surgery  Code Status: Full code  DVT Prophylaxis: Lovenox  Antibiotics: None  Family Communication: None  Disposition Plan: Home likely in 2 to 3 days depending on clinical progression, follow surgical recommendations  Consultants:  General surgery   Procedures:  None  Antibiotics: Anti-infectives (From admission, onward)   None      Objective: Vitals:   12/19/18 0426 12/19/18 0832  BP: (!) 148/78 (!) 164/79  Pulse: 87 86  Resp: 18 18  Temp: 98.9 F (37.2 C) 98.7 F (37.1 C)  SpO2: 96% 94%    Intake/Output  Summary (Last 24 hours) at 12/19/2018 1511 Last data filed at 12/19/2018 0900 Gross per 24 hour  Intake 1867 ml  Output 1150 ml  Net 717 ml   Filed Weights   12/16/18 0329 12/17/18 0615  12/17/18 2008  Weight: 92.4 kg 94.6 kg 94.6 kg   Body mass index is 36.94 kg/m.   Physical Exam: General: Morbidly obese,  NG tube in place HENT: Normocephalic, pupils equally reacting to light and accommodation.  No scleral pallor or icterus noted. Oral mucosa is moist. Chest:  Clear breath sounds.  Diminished breath sounds bilaterally. No crackles or wheezes.  CVS: S1 &S2 heard. No murmur.  Regular rate and rhythm. Abdomen: Soft, nonspecific tenderness.  Obese abdomen.  Previous surgical scar noted..  Bowel sounds are heard.  Liver is not palpable, no abdominal mass palpated Extremities: No cyanosis, clubbing or edema.  Peripheral pulses are palpable. Psych: Alert, awake and oriented, normal mood CNS:  No cranial nerve deficits.  Power equal in all extremities.  No sensory deficits noted.  No cerebellar signs.   Skin: Warm and dry.  No rashes noted.  Data Review: I have personally reviewed the following laboratory data and studies,  CBC: Recent Labs  Lab 12/15/18 1521 12/16/18 0637 12/17/18 0625 12/18/18 0318  WBC 13.3* 11.4* 8.7 7.4  NEUTROABS 11.3*  --   --   --   HGB 16.2* 14.9 14.5 13.8  HCT 48.2* 44.2 43.9 42.6  MCV 81.7 83.4 84.9 85.5  PLT 346 320 292 288   Basic Metabolic Panel: Recent Labs  Lab 12/15/18 1521 12/16/18 0637 12/17/18 0625 12/18/18 0318  NA 134* 140 141 142  K 4.4 4.2 4.1 3.5  CL 100 103 105 113*  CO2 19* 24 24 21*  GLUCOSE 429* 145* 200* 157*  BUN 13 14 15 15   CREATININE 1.09* 0.91 1.00 0.98  CALCIUM 9.5 9.1 8.9 8.4*  MG  --  1.9 1.9 1.6*  PHOS  --  4.5 3.5 3.1   Liver Function Tests: Recent Labs  Lab 12/15/18 1521 12/16/18 0637  AST 15 12*  ALT 14 12  ALKPHOS 122 114  BILITOT 1.1 0.8  PROT 7.1 6.5  ALBUMIN 3.9 3.6   Recent Labs  Lab 12/15/18 1521  LIPASE 22   No results for input(s): AMMONIA in the last 168 hours. Cardiac Enzymes: Recent Labs  Lab 12/15/18 1521  TROPONINI <0.03   BNP (last 3 results) No results for  input(s): BNP in the last 8760 hours.  ProBNP (last 3 results) No results for input(s): PROBNP in the last 8760 hours.  CBG: Recent Labs  Lab 12/18/18 1608 12/18/18 2005 12/19/18 0025 12/19/18 0423 12/19/18 1104  GLUCAP 164* 89 119* 89 98   Recent Results (from the past 240 hour(s))  SARS Coronavirus 2 (CEPHEID - Performed in [facility] hospital lab), Hosp Order     Status: None   Collection Time: 12/15/18  3:21 PM  Result Value Ref Range Status   SARS Coronavirus 2 NEGATIVE NEGATIVE Final    Comment: (NOTE) If result is NEGATIVE SARS-CoV-2 target nucleic acids are NOT DETECTED. The SARS-CoV-2 RNA is generally detectable in upper and lower  respiratory specimens during the acute phase of infection. The lowest  concentration of SARS-CoV-2 viral copies this assay can detect is 250  copies / mL. A negative result does not preclude SARS-CoV-2 infection  and should not be used as the sole basis for treatment or other  patient management decisions.  A negative  result may occur with  improper specimen collection / handling, submission of specimen other  than nasopharyngeal swab, presence of viral mutation(s) within the  areas targeted by this assay, and inadequate number of viral copies  (<250 copies / mL). A negative result must be combined with clinical  observations, patient history, and epidemiological information. If result is POSITIVE SARS-CoV-2 target nucleic acids are DETECTED. The SARS-CoV-2 RNA is generally detectable in upper and lower  respiratory specimens dur ing the acute phase of infection.  Positive  results are indicative of active infection with SARS-CoV-2.  Clinical  correlation with patient history and other diagnostic information is  necessary to determine patient infection status.  Positive results do  not rule out bacterial infection or co-infection with other viruses. If result is PRESUMPTIVE POSTIVE SARS-CoV-2 nucleic acids MAY BE PRESENT.   A  presumptive positive result was obtained on the submitted specimen  and confirmed on repeat testing.  While 2019 novel coronavirus  (SARS-CoV-2) nucleic acids may be present in the submitted sample  additional confirmatory testing may be necessary for epidemiological  and / or clinical management purposes  to differentiate between  SARS-CoV-2 and other Sarbecovirus currently known to infect humans.  If clinically indicated additional testing with an alternate test  methodology [id number]) is advised. The SARS-CoV-2 RNA is generally  detectable in upper and lower respiratory sp ecimens during the acute  phase of infection. The expected result is Negative. Fact Sheet for Patients:  BoilerBrush.com.cy Fact Sheet for Healthcare Providers: https://pope.com/ This test is not yet approved or cleared by the [place] FDA and has been authorized for detection and/or diagnosis of SARS-CoV-2 by FDA under an Emergency Use Authorization (EUA).  This EUA will remain in effect (meaning this test can be used) for the duration of the COVID-19 declaration under Section 564(b)(1) of the Act, 21 U.S.C. section 360bbb-3(b)(1), unless the authorization is terminated or revoked sooner. Performed at [facility], [address] [place], [place] [address]      Studies: Dg Abd 2 Views  Result Date: 12/18/2018 CLINICAL DATA:  Right-sided abdominal pain. Small-bowel obstruction. EXAM: ABDOMEN - 2 VIEW COMPARISON:  12/17/2018; 12/16/2018; CT abdomen pelvis-12/15/2018 FINDINGS: Enteric tube tip and side port projected the expected location of the mid body of the stomach. Continued slow transit ingested contrast now seen to the level of the descending colon, however there is persistent contrast opacification of distended loops distal small bowel with index loop measuring approximately 4 cm in diameter. No pneumoperitoneum, pneumatosis or portal venous gas.  Limited visualization of lower thorax demonstrates minimal left basilar atelectasis. Post cholecystectomy. Post lumbar paraspinal fusion intervertebral disc space replacement, incompletely evaluated. Stigmata DISH within the lower thoracic spine. IMPRESSION: Findings most suggestive of minimally improved partial small bowel obstruction. Electronically Signed   By: Simonne Come M.D.   On: 12/18/2018 08:29   Dg Abd Portable 1v  Result Date: 12/19/2018 CLINICAL DATA:  68 year old female with small bowel obstruction on recent CT. Prior bowel surgery. EXAM: PORTABLE ABDOMEN - 1 VIEW COMPARISON:  12/18/2018 and earlier, CT Abdomen and Pelvis 12/15/2018. FINDINGS: Portable AP supine view at 0625 hours. Stable enteric tube, side hole the level of the gastric body. Stable cholecystectomy clips. Previous lumbar spine surgery. Small volume retained oral contrast mixed with stool in the colon. There are no dilated loops today. Stable visualized osseous structures. IMPRESSION: 1. Normalized gas pattern, resolved small bowel obstruction. 2. Stable enteric tube. Electronically Signed   By: Odessa Fleming  M.D.   On: 12/19/2018 08:23    Scheduled Meds:  enoxaparin (LOVENOX) injection  1 mg/kg Subcutaneous Q12H   insulin aspart  0-15 Units Subcutaneous Q4H   insulin glargine  20 Units Subcutaneous Q2200    Continuous Infusions:  sodium chloride 150 mL/hr at 12/19/18 0960     Alvira Philips [place], DO  [facility] 12/19/2018

## 2018-12-19 NOTE — Plan of Care (Signed)
  Problem: Nutrition: Goal: Adequate nutrition will be maintained Outcome: Progressing   Problem: Elimination: Goal: Will not experience complications related to bowel motility Outcome: Progressing   Problem: Pain Managment: Goal: General experience of comfort will improve Outcome: Progressing   

## 2018-12-19 NOTE — Care Management Important Message (Signed)
Important Message  Patient Details  Name: [name] MRN: [id number] Date of Birth: 02-04-1951   Medicare Important Message Given:  Yes    [name] 12/19/2018, 2:16 PM

## 2018-12-19 NOTE — Progress Notes (Signed)
[facility] [place] [facility] Progress Note     Subjective: CC-  Patient reports that her abdominal pain has improved in last 24 hrs - much less now. Also reports improved distention. She reports no nausea, no emesis. Bloating about the same. NG now with more gastric appearing fluid, not bilious as it was yesterday. Continues to pass flatus - last was this morning. No BM as of yet but XR shows a now normal gas pattern  Objective: Vital signs in last 24 hours: Temp:  [97.8 F (36.6 C)-99.1 F (37.3 C)] 98.7 F (37.1 C) (05/13 0832) Pulse Rate:  [86-100] 86 (05/13 0832) Resp:  [16-18] 18 (05/13 0832) BP: (148-170)/(63-79) 164/79 (05/13 0832) SpO2:  [91 %-96 %] 94 % (05/13 0832) Last BM Date: 12/11/18  Intake/Output from previous day: 05/12 0701 - 05/13 0700 In: 6942.7 [P.O.:60; I.V.:6232.7; NG/GT:600; IV Piggyback:50] Out: 1550 [Urine:1400; Emesis/NG output:150] Intake/Output this shift: No intake/output data recorded.  PE: Gen:  Alert, NAD HEENT: EOM's intact, pupils equal and round Card:  tachy Pulm:  CTAB, no W/R/R, effort normal Abd: Soft, mildly ttp in upper quadrants, not significantly distended. No tenderness laterally Ext:  Calves soft and nontender Psych: A&Ox3  Skin: no rashes noted, warm and dry  Lab Results:  Recent Labs    12/17/18 0625 12/18/18 0318  WBC 8.7 7.4  HGB 14.5 13.8  HCT 43.9 42.6  PLT 292 288   BMET Recent Labs    12/17/18 0625 12/18/18 0318  NA 141 142  K 4.1 3.5  CL 105 113*  CO2 24 21*  GLUCOSE 200* 157*  BUN 15 15  CREATININE 1.00 0.98  CALCIUM 8.9 8.4*   PT/INR No results for input(s): LABPROT, INR in the last 72 hours. CMP     Component Value Date/Time   NA 142 12/18/2018 0318   K 3.5 12/18/2018 0318   CL 113 (H) 12/18/2018 0318   CO2 21 (L) 12/18/2018 0318   GLUCOSE 157 (H) 12/18/2018 0318   BUN 15 12/18/2018 0318   CREATININE 0.98 12/18/2018 0318   CALCIUM 8.4 (L) 12/18/2018 0318   PROT 6.5 12/16/2018 0637   ALBUMIN 3.6 12/16/2018 0637   AST 12 (L) 12/16/2018 0637   ALT 12 12/16/2018 0637   ALKPHOS 114 12/16/2018 0637   BILITOT 0.8 12/16/2018 0637   GFRNONAA 59 (L) 12/18/2018 0318   GFRAA >60 12/18/2018 0318   Lipase     Component Value Date/Time   LIPASE 22 12/15/2018 1521       Studies/Results: Dg Abd 2 Views  Result Date: 12/18/2018 CLINICAL DATA:  Right-sided abdominal pain. Small-bowel obstruction. EXAM: ABDOMEN - 2 VIEW COMPARISON:  12/17/2018; 12/16/2018; CT abdomen pelvis-12/15/2018 FINDINGS: Enteric tube tip and side port projected the expected location of the mid body of the stomach. Continued slow transit ingested contrast now seen to the level of the descending colon, however there is persistent contrast opacification of distended loops distal small bowel with index loop measuring approximately 4 cm in diameter. No pneumoperitoneum, pneumatosis or portal venous gas. Limited visualization of lower thorax demonstrates minimal left basilar atelectasis. Post cholecystectomy. Post lumbar paraspinal fusion intervertebral disc space replacement, incompletely evaluated. Stigmata DISH within the lower thoracic spine. IMPRESSION: Findings most suggestive of minimally improved partial small bowel obstruction. Electronically Signed   By: Sandi Mariscal M.D.   On: 12/18/2018 08:29   Dg Abd Portable 1v  Result Date: 12/19/2018 CLINICAL DATA:  68 year old female with small bowel obstruction on recent CT. Prior bowel surgery. EXAM:  PORTABLE ABDOMEN - 1 VIEW COMPARISON:  12/18/2018 and earlier, CT Abdomen and Pelvis 12/15/2018. FINDINGS: Portable AP supine view at 0625 hours. Stable enteric tube, side hole the level of the gastric body. Stable cholecystectomy clips. Previous lumbar spine surgery. Small volume retained oral contrast mixed with stool in the colon. There are no dilated loops today. Stable visualized osseous structures. IMPRESSION: 1. Normalized gas pattern, resolved small bowel obstruction.  2. Stable enteric tube. Electronically Signed   By: Genevie Ann M.D.   On: 12/19/2018 08:23    Anti-infectives: Anti-infectives (From admission, onward)   None       Assessment/Plan HTN DM-II Diastolic dysfunction Hypothyroidism Fibromyalgia H/o stroke Obestity H/o DVT on Xarelto at home (last dose 5/8) - on therapeutic lovenox this admission AKI - Cr 1, resolved  SBO - h/o tubal ligation, appendectomy, c section x2, open cholecystectomy, laparoscopic right colectomy for villous adenoma 04/08/2016 - Xray today significant improvement. Clinically improving appropriately as well. If continues to feel well, will plan to clamp tomorrow and allow clear liquids  ID - none FEN - IVF, NPO/NGT  VTE - lovenox 90mg  BID Foley - none Follow up - TBD   LOS: 4 days   [name], [facility] 12/19/2018, 8:50 AM

## 2018-12-20 ENCOUNTER — Inpatient Hospital Stay (HOSPITAL_COMMUNITY): Payer: Medicare Other

## 2018-12-20 LAB — BASIC METABOLIC PANEL
Anion gap: 14 (ref 5–15)
BUN: 7 mg/dL — ABNORMAL LOW (ref 8–23)
CO2: 20 mmol/L — ABNORMAL LOW (ref 22–32)
Calcium: 8 mg/dL — ABNORMAL LOW (ref 8.9–10.3)
Chloride: 105 mmol/L (ref 98–111)
Creatinine, Ser: 0.94 mg/dL (ref 0.44–1.00)
GFR calc Af Amer: >60 mL/min (ref >60)
GFR calc non Af Amer: >60 mL/min (ref >60)
Glucose, Bld: 98 mg/dL (ref 70–99)
Potassium: 2.7 mmol/L — CL (ref 3.5–5.1)
Sodium: 139 mmol/L (ref 135–145)

## 2018-12-20 LAB — GLUCOSE, CAPILLARY
Glucose-Capillary: 106 mg/dL — ABNORMAL HIGH (ref 70–99)
Glucose-Capillary: 118 mg/dL — ABNORMAL HIGH (ref 70–99)
Glucose-Capillary: 146 mg/dL — ABNORMAL HIGH (ref 70–99)
Glucose-Capillary: 169 mg/dL — ABNORMAL HIGH (ref 70–99)
Glucose-Capillary: 190 mg/dL — ABNORMAL HIGH (ref 70–99)
Glucose-Capillary: 93 mg/dL (ref 70–99)
Glucose-Capillary: 96 mg/dL (ref 70–99)

## 2018-12-20 MED ORDER — SODIUM CHLORIDE 0.9 % IV SOLN
INTRAVENOUS | Status: DC | PRN
  Administered 2018-12-20: 250 mL via INTRAVENOUS

## 2018-12-20 MED ORDER — POTASSIUM CHLORIDE 10 MEQ/100ML IV SOLN
10.0000 meq | INTRAVENOUS | Status: AC
  Administered 2018-12-20 (×4): 10 meq via INTRAVENOUS
  Filled 2018-12-20 (×4): qty 100

## 2018-12-20 MED ORDER — LEVOTHYROXINE SODIUM 75 MCG PO TABS
150.0000 ug | ORAL_TABLET | Freq: Every day | ORAL | Status: DC
  Administered 2018-12-22: 150 ug via ORAL
  Filled 2018-12-20: qty 2

## 2018-12-20 NOTE — Progress Notes (Signed)
[facility] Progress Note     Subjective: CC-  Abdominal pain has resolved. Distention resolved; denies n/v. Continues to pass flatus. No BM as of yet but XR shows a now normal gas pattern  Objective: Vital signs in last 24 hours: Temp:  [97.8 F (36.6 C)-98.7 F (37.1 C)] 98.4 F (36.9 C) (05/14 0418) Pulse Rate:  [79-86] 79 (05/14 0418) Resp:  [18-24] 20 (05/14 0418) BP: (157-169)/(79-93) 169/82 (05/14 0418) SpO2:  [94 %-98 %] 98 % (05/14 0418) Weight:  [96.3 kg] 96.3 kg (05/13 1700) Last BM Date: 12/11/18  Intake/Output from previous day: 05/13 0701 - 05/14 0700 In: 3585.8 [I.V.:3585.8] Out: 1210 [Urine:610; Emesis/NG output:200] Intake/Output this shift: No intake/output data recorded.  PE: Gen:  Alert, NAD HEENT: EOM's intact, pupils equal and round Card:  tachy Pulm:  CTAB, no W/R/R, effort normal Abd: Soft, nontender, not significantly distended. Ext:  Calves soft and nontender Psych: A&Ox3  Skin: no rashes noted, warm and dry  Lab Results:  Recent Labs    12/18/18 0318  WBC 7.4  HGB 13.8  HCT 42.6  PLT 288   BMET Recent Labs    12/18/18 0318 12/20/18 0352  NA 142 139  K 3.5 2.7*  CL 113* 105  CO2 21* 20*  GLUCOSE 157* 98  BUN 15 7*  CREATININE 0.98 0.94  CALCIUM 8.4* 8.0*   PT/INR No results for input(s): LABPROT, INR in the last 72 hours. CMP     Component Value Date/Time   NA 139 12/20/2018 0352   K 2.7 (LL) 12/20/2018 0352   CL 105 12/20/2018 0352   CO2 20 (L) 12/20/2018 0352   GLUCOSE 98 12/20/2018 0352   BUN 7 (L) 12/20/2018 0352   CREATININE 0.94 12/20/2018 0352   CALCIUM 8.0 (L) 12/20/2018 0352   PROT 6.5 12/16/2018 0637   ALBUMIN 3.6 12/16/2018 0637   AST 12 (L) 12/16/2018 0637   ALT 12 12/16/2018 0637   ALKPHOS 114 12/16/2018 0637   BILITOT 0.8 12/16/2018 0637   GFRNONAA >60 12/20/2018 0352   GFRAA >60 12/20/2018 0352   Lipase     Component Value Date/Time   LIPASE 22 12/15/2018 1521        Studies/Results: Dg Abd Portable 1v  Result Date: 12/19/2018 CLINICAL DATA:  68 year old female with small bowel obstruction on recent CT. Prior bowel surgery. EXAM: PORTABLE ABDOMEN - 1 VIEW COMPARISON:  12/18/2018 and earlier, CT Abdomen and Pelvis 12/15/2018. FINDINGS: Portable AP supine view at 0625 hours. Stable enteric tube, side hole the level of the gastric body. Stable cholecystectomy clips. Previous lumbar spine surgery. Small volume retained oral contrast mixed with stool in the colon. There are no dilated loops today. Stable visualized osseous structures. IMPRESSION: 1. Normalized gas pattern, resolved small bowel obstruction. 2. Stable enteric tube. Electronically Signed   By: Genevie Ann M.D.   On: 12/19/2018 08:23    Anti-infectives: Anti-infectives (From admission, onward)   None       Assessment/Plan HTN DM-II Diastolic dysfunction Hypothyroidism Fibromyalgia H/o stroke Obestity H/o DVT on Xarelto at home (last dose 5/8) - on therapeutic lovenox this admission AKI - Cr 1, resolved  SBO - h/o tubal ligation, appendectomy, c section x2, open cholecystectomy, laparoscopic right colectomy for villous adenoma 04/08/2016 - Xray showed ~resolution. Clinically appears to have resolved as well -Clamp NG, start clear liquids; if tolerating, can remove NG tube this afternoon  ID - none FEN - IVF, NPO/NGT  VTE - lovenox 90mg  BID  Foley - none Follow up - TBD   LOS: 5 days   Ileana Roup, [facility] 12/20/2018, 8:30 AM

## 2018-12-20 NOTE — Progress Notes (Signed)
CRITICAL VALUE ALERT Critical Value:  K+ level 2.7  Date & Time Notied:  12/20/2018 @ 05:26  Provider Notified: 12/20/2018 @ 05:40  Orders Received/Actions taken: in epic

## 2018-12-20 NOTE — Progress Notes (Signed)
PROGRESS NOTE  [name] LPF:[id number] DOB: August 24, 1950 DOA: 12/15/2018 PCP: Harlan Stains, MD   LOS: 5 days   Brief narrative: [name] is a 68 y.o. female with past medical history of diabetes mellitus currently on insulin, GERD, hypertension, hyperlipidemia, hypothyroidism, history of stroke, diastolic dysfunction, fibromyalgia, prior history of DVT on Xarelto and multiple abdominal surgeries presented to the hospital with complaints of abdominal pain for 1 day associated with nausea and multiple episodes of vomiting, consideration for few days.  She tried MiraLAX and Dulcolax without relief.   In the ED, patient had a CT scan of the abdomen and pelvis which showed surgical changes in the right midabdomen with changes consistent with adhesions and at least a partial distal small bowel obstruction.  Surgery was consulted from the ED and was advised NG tube, IV fluids and conservative management.    Subjective:  Patient seen and examined at bedside, resting comfortably in bed.  States continues to pass flatus, no bowel movements as of yet.  Abdominal pain resolved.  No other complaints at this time.  General surgery plans to clamp NG tube today and start clear liquid diet.  Denies chest pain, no shortness of breath, no palpitations, no nausea/vomiting/diarrhea, no fever/chills/night sweats.  No acute events overnight per nursing staff.  Assessment/Plan:  Principal Problem:   Partial bowel obstruction (HCC) Active Problems:   Type II diabetes mellitus (HCC)   Hypothyroidism   Hypertension   Acute kidney injury ([place])   Diastolic dysfunction  Partial small bowel obstruction likely secondary to adhesions.    CT abdomen/pelvis notable for postsurgical changes right midabdomen consistent with adhesions and at least partial distal small bowel obstruction. --General surgery following, appreciate assistance --KUB this morning shows normalize gas pattern, resolved small bowel obstruction  --NGT clamp and start CLD per surgery --Encourage continued mobilization/ambulation  Mild leukocytosis: Resolved Count on admission 13.3.  Likely reactive.  Now normalized.  Diabetes mellitus type 2  Tresiba 40 units sq daily and Humalog 14 units 3 times daily AC.  Last Hemoglobin A1c 8.2 on 05/16/2018. --Continue reduced dose basal insulin with Lantus 20 units qHS --NovoLog insulin sliding scale for coverage --IV fluid hydration --CBG TIDAC --repeat HbA1c --Monitor for hypoglycemia  History of hypothyroidism.   --resume home levothyroxine  History of diastolic dysfunction.       Continue IV fluid hydration.  Compensated at this time.  Holding home Lasix.  Essential hypertension.  Will closely monitor.  PRN hydralazine with improvement in her blood pressure.  GERD:  Continue Protonix.  History of fibromyalgia:  On gabapentin at home.  Will hold at this time.   History of stroke:  Hold oral medications for now.  DVT:  On Xarelto as outpatient.  Therapeutic Lovenox here in the hospital  Consultant: General surgery  Code Status: Full code  DVT Prophylaxis: Lovenox  Antibiotics: None  Family Communication: None  Disposition Plan: Home likely in 1-2 days depending on clinical progression, following surgical recommendations  Consultants:  General surgery   Procedures:  None  Antibiotics: Anti-infectives (From admission, onward)   None      Objective: Vitals:   12/20/18 0418 12/20/18 0939  BP: (!) 169/82 (!) 166/81  Pulse: 79 74  Resp: 20 18  Temp: 98.4 F (36.9 C) 98.1 F (36.7 C)  SpO2: 98% 98%    Intake/Output Summary (Last 24 hours) at 12/20/2018 1313 Last data filed at 12/20/2018 0842 Gross per 24 hour  Intake 3585.76 ml  Output 1760  ml  Net 1825.76 ml   Filed Weights   12/17/18 0615 12/17/18 2008 12/19/18 1700  Weight: 94.6 kg 94.6 kg 96.3 kg   Body mass index is 37.61 kg/m.   Physical Exam: General: Morbidly obese,  NG tube  in place HENT: Normocephalic, pupils equally reacting to light and accommodation.  No scleral pallor or icterus noted. Oral mucosa is moist. Chest:  Clear breath sounds.  Diminished breath sounds bilaterally. No crackles or wheezes.  CVS: S1 &S2 heard. No murmur.  Regular rate and rhythm. Abdomen: Soft, nonspecific tenderness.  Obese abdomen.  Previous surgical scar noted..  Bowel sounds are heard.  Liver is not palpable, no abdominal mass palpated Extremities: No cyanosis, clubbing or edema.  Peripheral pulses are palpable. Psych: Alert, awake and oriented, normal mood CNS:  No cranial nerve deficits.  Power equal in all extremities.  No sensory deficits noted.  No cerebellar signs.   Skin: Warm and dry.  No rashes noted.  Data Review: I have personally reviewed the following laboratory data and studies,  CBC: Recent Labs  Lab 12/15/18 1521 12/16/18 0637 12/17/18 0625 12/18/18 0318  WBC 13.3* 11.4* 8.7 7.4  NEUTROABS 11.3*  --   --   --   HGB 16.2* 14.9 14.5 13.8  HCT 48.2* 44.2 43.9 42.6  MCV 81.7 83.4 84.9 85.5  PLT 346 320 292 315   Basic Metabolic Panel: Recent Labs  Lab 12/15/18 1521 12/16/18 0637 12/17/18 0625 12/18/18 0318 12/20/18 0352  NA 134* 140 141 142 139  K 4.4 4.2 4.1 3.5 2.7*  CL 100 103 105 113* 105  CO2 19* 24 24 21* 20*  GLUCOSE 429* 145* 200* 157* 98  BUN 13 14 15 15  7*  CREATININE 1.09* 0.91 1.00 0.98 0.94  CALCIUM 9.5 9.1 8.9 8.4* 8.0*  MG  --  1.9 1.9 1.6*  --   PHOS  --  4.5 3.5 3.1  --    Liver Function Tests: Recent Labs  Lab 12/15/18 1521 12/16/18 0637  AST 15 12*  ALT 14 12  ALKPHOS 122 114  BILITOT 1.1 0.8  PROT 7.1 6.5  ALBUMIN 3.9 3.6   Recent Labs  Lab 12/15/18 1521  LIPASE 22   No results for input(s): AMMONIA in the last 168 hours. Cardiac Enzymes: Recent Labs  Lab 12/15/18 1521  TROPONINI <0.03   BNP (last 3 results) No results for input(s): BNP in the last 8760 hours.  ProBNP (last 3 results) No results for  input(s): PROBNP in the last 8760 hours.  CBG: Recent Labs  Lab 12/19/18 2012 12/20/18 0019 12/20/18 0417 12/20/18 0724 12/20/18 1109  GLUCAP 97 106* 96 93 118*   Recent Results (from the past 240 hour(s))  SARS Coronavirus 2 (CEPHEID - Performed in [place] hospital lab), Hosp Order     Status: None   Collection Time: 12/15/18  3:21 PM  Result Value Ref Range Status   SARS Coronavirus 2 NEGATIVE NEGATIVE Final    Comment: (NOTE) If result is NEGATIVE SARS-CoV-2 target nucleic acids are NOT DETECTED. The SARS-CoV-2 RNA is generally detectable in upper and lower  respiratory specimens during the acute phase of infection. The lowest  concentration of SARS-CoV-2 viral copies this assay can detect is 250  copies / mL. A negative result does not preclude SARS-CoV-2 infection  and should not be used as the sole basis for treatment or other  patient management decisions.  A negative result may occur with  improper specimen collection /  handling, submission of specimen other  than nasopharyngeal swab, presence of viral mutation(s) within the  areas targeted by this assay, and inadequate number of viral copies  (<250 copies / mL). A negative result must be combined with clinical  observations, patient history, and epidemiological information. If result is POSITIVE SARS-CoV-2 target nucleic acids are DETECTED. The SARS-CoV-2 RNA is generally detectable in upper and lower  respiratory specimens dur ing the acute phase of infection.  Positive  results are indicative of active infection with SARS-CoV-2.  Clinical  correlation with patient history and other diagnostic information is  necessary to determine patient infection status.  Positive results do  not rule out bacterial infection or co-infection with other viruses. If result is PRESUMPTIVE POSTIVE SARS-CoV-2 nucleic acids MAY BE PRESENT.   A presumptive positive result was obtained on the submitted specimen  and confirmed on  repeat testing.  While 2019 novel coronavirus  (SARS-CoV-2) nucleic acids may be present in the submitted sample  additional confirmatory testing may be necessary for epidemiological  and / or clinical management purposes  to differentiate between  SARS-CoV-2 and other Sarbecovirus currently known to infect humans.  If clinically indicated additional testing with an alternate test  methodology [phone]) is advised. The SARS-CoV-2 RNA is generally  detectable in upper and lower respiratory sp ecimens during the acute  phase of infection. The expected result is Negative. Fact Sheet for Patients:  StrictlyIdeas.no Fact Sheet for Healthcare Providers: BankingDealers.co.za This test is not yet approved or cleared by the [place] FDA and has been authorized for detection and/or diagnosis of SARS-CoV-2 by FDA under an Emergency Use Authorization (EUA).  This EUA will remain in effect (meaning this test can be used) for the duration of the COVID-19 declaration under Section 564(b)(1) of the Act, 21 U.S.C. section 360bbb-3(b)(1), unless the authorization is terminated or revoked sooner. Performed at [facility], [place] [address] [place], [place] [address]      Studies: Dg Abd Portable 1v  Result Date: 12/20/2018 CLINICAL DATA:  Abdomen pain. SBO. EXAM: PORTABLE ABDOMEN - 1 VIEW COMPARISON:  12/19/2018. FINDINGS: There has been some partial clearing of enteric contrast from the colon, presumed evacuation. There are no dilated small bowel loops, and the colon is less distended with fecal material and contrast than was observed on yesterday's radiograph. Previous lumbar instrumentation appears unchanged. Cholecystectomy clips. Nasogastric tube is seen in the stomach. No acute osseous findings. No visible free air on this portable supine radiograph. IMPRESSION: No evidence for small bowel obstruction. Electronically Signed   By: Staci Righter  M.D.   On: 12/20/2018 09:11   Dg Abd Portable 1v  Result Date: 12/19/2018 CLINICAL DATA:  68 year old female with small bowel obstruction on recent CT. Prior bowel surgery. EXAM: PORTABLE ABDOMEN - 1 VIEW COMPARISON:  12/18/2018 and earlier, CT Abdomen and Pelvis 12/15/2018. FINDINGS: Portable AP supine view at 0625 hours. Stable enteric tube, side hole the level of the gastric body. Stable cholecystectomy clips. Previous lumbar spine surgery. Small volume retained oral contrast mixed with stool in the colon. There are no dilated loops today. Stable visualized osseous structures. IMPRESSION: 1. Normalized gas pattern, resolved small bowel obstruction. 2. Stable enteric tube. Electronically Signed   By: Genevie Ann M.D.   On: 12/19/2018 08:23    Scheduled Meds: . enoxaparin (LOVENOX) injection  1 mg/kg Subcutaneous Q12H  . insulin aspart  0-15 Units Subcutaneous Q4H  . insulin glargine  20 Units Subcutaneous Q2200  Continuous Infusions: . sodium chloride 150 mL/hr at 12/20/18 0536  . sodium chloride 250 mL (12/20/18 0601)     Eric J [place] DO  [facility] 12/20/2018

## 2018-12-21 LAB — BASIC METABOLIC PANEL
Anion gap: 12 (ref 5–15)
BUN: 6 mg/dL — ABNORMAL LOW (ref 8–23)
CO2: 13 mmol/L — ABNORMAL LOW (ref 22–32)
Calcium: 7.9 mg/dL — ABNORMAL LOW (ref 8.9–10.3)
Chloride: 110 mmol/L (ref 98–111)
Creatinine, Ser: 0.99 mg/dL (ref 0.44–1.00)
GFR calc Af Amer: >60 mL/min (ref >60)
GFR calc non Af Amer: 59 mL/min — ABNORMAL LOW (ref >60)
Glucose, Bld: 128 mg/dL — ABNORMAL HIGH (ref 70–99)
Potassium: 3.1 mmol/L — ABNORMAL LOW (ref 3.5–5.1)
Sodium: 135 mmol/L (ref 135–145)

## 2018-12-21 LAB — HEMOGLOBIN A1C
Hgb A1c MFr Bld: 9.3 % — ABNORMAL HIGH (ref 4.8–5.6)
Mean Plasma Glucose: 220.21 mg/dL

## 2018-12-21 LAB — GLUCOSE, CAPILLARY
Glucose-Capillary: 109 mg/dL — ABNORMAL HIGH (ref 70–99)
Glucose-Capillary: 124 mg/dL — ABNORMAL HIGH (ref 70–99)
Glucose-Capillary: 239 mg/dL — ABNORMAL HIGH (ref 70–99)
Glucose-Capillary: 358 mg/dL — ABNORMAL HIGH (ref 70–99)
Glucose-Capillary: 317 mg/dL — ABNORMAL HIGH (ref 70–99)
Glucose-Capillary: 262 mg/dL — ABNORMAL HIGH (ref 70–99)

## 2018-12-21 LAB — MAGNESIUM: Magnesium: 1.6 mg/dL — ABNORMAL LOW (ref 1.7–2.4)

## 2018-12-21 MED ORDER — POTASSIUM CHLORIDE CRYS ER 20 MEQ PO TBCR
40.0000 meq | EXTENDED_RELEASE_TABLET | ORAL | Status: AC
  Administered 2018-12-21 (×2): 40 meq via ORAL
  Filled 2018-12-21 (×2): qty 2

## 2018-12-21 MED ORDER — TEMAZEPAM 15 MG PO CAPS
15.0000 mg | ORAL_CAPSULE | Freq: Every evening | ORAL | Status: DC | PRN
  Administered 2018-12-21 (×2): 15 mg via ORAL
  Filled 2018-12-21 (×2): qty 1

## 2018-12-21 MED ORDER — ENSURE ENLIVE PO LIQD
237.0000 mL | Freq: Two times a day (BID) | ORAL | Status: DC
  Administered 2018-12-21 (×2): 237 mL via ORAL

## 2018-12-21 MED ORDER — RIVAROXABAN 20 MG PO TABS
20.0000 mg | ORAL_TABLET | Freq: Every day | ORAL | Status: DC
  Administered 2018-12-21: 20 mg via ORAL
  Filled 2018-12-21 (×2): qty 1

## 2018-12-21 MED ORDER — INSULIN ASPART 100 UNIT/ML ~~LOC~~ SOLN
0.0000 [IU] | Freq: Three times a day (TID) | SUBCUTANEOUS | Status: DC
  Administered 2018-12-21: 15 [IU] via SUBCUTANEOUS
  Administered 2018-12-21: 8 [IU] via SUBCUTANEOUS
  Administered 2018-12-21: 5 [IU] via SUBCUTANEOUS
  Administered 2018-12-22: 8 [IU] via SUBCUTANEOUS

## 2018-12-21 NOTE — Final Consult Note (Signed)
Consultant Final Sign-Off Note    Assessment/Final recommendations  [name] is a 68 y.o. female followed by me for a small bowel obstruction.   Wound care (if applicable): N/A   Diet at discharge: soft diet for 2-4 weeks after discharge and then transition to high fiber diet   Activity at discharge: per primary team   Follow-up appointment: PRN   Pending results:  Unresulted Labs (From admission, onward)    Start     Ordered   12/22/18 8786  Basic metabolic panel  Daily,   R    Question:  Specimen collection method  Answer:  Lab=Lab collect   12/21/18 1234           Medication recommendations: Okay to restart home Xarelto.    Other recommendations: N/A    Thank you for allowing [place] to participate in the care of your patient!  Please consult [place] again if you have further needs for your patient.  Barth Kirks [facility] 12/21/2018 1:51 PM

## 2018-12-21 NOTE — Progress Notes (Signed)
PROGRESS NOTE  [name] ZOX:[id number] DOB: [birth year] DOA: 12/15/2018 PCP: [name], MD   LOS: 6 days   Brief narrative: [name] is a 68 y.o. female with past medical history of diabetes mellitus currently on insulin, GERD, hypertension, hyperlipidemia, hypothyroidism, history of stroke, diastolic dysfunction, fibromyalgia, prior history of DVT on Xarelto and multiple abdominal surgeries presented to the hospital with complaints of abdominal pain for 1 day associated with nausea and multiple episodes of vomiting, consideration for few days.  She tried MiraLAX and Dulcolax without relief.   In the ED, patient had a CT scan of the abdomen and pelvis which showed surgical changes in the right midabdomen with changes consistent with adhesions and at least a partial distal small bowel obstruction.  Surgery was consulted from the ED and was advised NG tube, IV fluids and conservative management.    Subjective:  Patient seen and examined at bedside.  Resting comfortably.  Tolerating clear liquid diet.  NG tube removed.  General surgery advancing diet to full liquids this afternoon, and may advance to soft diet later today.  Patient reports 2 bowel movements yesterday.  Continues to pass flatus.  Denies chest pain, no shortness of breath, no palpitations, no nausea/vomiting/diarrhea, no fever/chills/night sweats.  No acute events overnight per nursing staff.  Assessment/Plan:  Principal Problem:   Partial bowel obstruction (HCC) Active Problems:   Type II diabetes mellitus (HCC)   Hypothyroidism   Hypertension   Acute kidney injury (HCC)   Diastolic dysfunction  Partial small bowel obstruction likely secondary to adhesions.    CT abdomen/pelvis notable for postsurgical changes right midabdomen consistent with adhesions and at least partial distal small bowel obstruction. --General surgery following, appreciate assistance --NG tube removed, tolerated clear liquid diet, now  transition to full liquid diet --Encourage continued mobilization/ambulation --Advance to soft diet if tolerates full liquid diet per general surgery this evening  Mild leukocytosis: Resolved Count on admission 13.3.  Likely reactive.  Now normalized.  Diabetes mellitus type 2  Home regimen includes Tresiba 40 units sq daily and Humalog 14 units 3 times daily AC.  --Hemoglobin A1c 9.3, poorly controlled --Continue reduced dose basal insulin with Lantus 20 units qHS --NovoLog insulin sliding scale for coverage --CBG TIDAC  History of hypothyroidism.   --Continue home levothyroxine  History of diastolic dysfunction.       Continue IV fluid hydration.  Compensated at this time.  Holding home Lasix.  Essential hypertension.  Will closely monitor.  PRN hydralazine with improvement in her blood pressure.  GERD:  Continue Protonix.  History of fibromyalgia:  On gabapentin at home.  Will hold at this time.   History of stroke:  Hold oral medications for now.  DVT:   Resume home Xarelto  Consultant: General surgery  Code Status: Full code  DVT Prophylaxis:  Xarelto  Antibiotics: None  Family Communication: None  Disposition Plan: Full for discharge home tomorrow if tolerates advance diet  Consultants:  General surgery   Procedures:  None  Antibiotics: Anti-infectives (From admission, onward)   None      Objective: Vitals:   12/21/18 0354 12/21/18 0902  BP: (!) 159/65 (!) 178/82  Pulse: 86 92  Resp: 20 18  Temp: 98.3 F (36.8 C) 98.3 F (36.8 C)  SpO2: 97% 99%    Intake/Output Summary (Last 24 hours) at 12/21/2018 1230 Last data filed at 12/21/2018 0900 Gross per 24 hour  Intake 4502.52 ml  Output 2000 ml  Net 2502.52 ml  Filed Weights   12/19/18 1700 12/20/18 2004 12/21/18 0323  Weight: 96.3 kg 96.3 kg 96.3 kg   Body mass index is 37.61 kg/m.   Physical Exam: General: Morbidly obese,  NG tube in place HENT: Normocephalic, pupils  equally reacting to light and accommodation.  No scleral pallor or icterus noted. Oral mucosa is moist. Chest:  Clear breath sounds.  Diminished breath sounds bilaterally. No crackles or wheezes.  CVS: S1 &S2 heard. No murmur.  Regular rate and rhythm. Abdomen: Soft, NTTP,  Obese abdomen.  Previous surgical scar noted.  Bowel sounds are heard.  Liver is not palpable, no abdominal mass palpated Extremities: No cyanosis, clubbing or edema.  Peripheral pulses are palpable. Psych: Alert, awake and oriented, normal mood CNS:  No cranial nerve deficits.  Power equal in all extremities.  No sensory deficits noted.  No cerebellar signs.   Skin: Warm and dry.  No rashes noted.  Data Review: I have personally reviewed the following laboratory data and studies,  CBC: Recent Labs  Lab 12/15/18 1521 12/16/18 0637 12/17/18 0625 12/18/18 0318  WBC 13.3* 11.4* 8.7 7.4  NEUTROABS 11.3*  --   --   --   HGB 16.2* 14.9 14.5 13.8  HCT 48.2* 44.2 43.9 42.6  MCV 81.7 83.4 84.9 85.5  PLT 346 320 292 288   Basic Metabolic Panel: Recent Labs  Lab 12/16/18 0637 12/17/18 0625 12/18/18 0318 12/20/18 0352 12/21/18 0328  NA 140 141 142 139 135  K 4.2 4.1 3.5 2.7* 3.1*  CL 103 105 113* 105 110  CO2 24 24 21* 20* 13*  GLUCOSE 145* 200* 157* 98 128*  BUN 14 15 15  7* 6*  CREATININE 0.91 1.00 0.98 0.94 0.99  CALCIUM 9.1 8.9 8.4* 8.0* 7.9*  MG 1.9 1.9 1.6*  --  1.6*  PHOS 4.5 3.5 3.1  --   --    Liver Function Tests: Recent Labs  Lab 12/15/18 1521 12/16/18 0637  AST 15 12*  ALT 14 12  ALKPHOS 122 114  BILITOT 1.1 0.8  PROT 7.1 6.5  ALBUMIN 3.9 3.6   Recent Labs  Lab 12/15/18 1521  LIPASE 22   No results for input(s): AMMONIA in the last 168 hours. Cardiac Enzymes: Recent Labs  Lab 12/15/18 1521  TROPONINI <0.03   BNP (last 3 results) No results for input(s): BNP in the last 8760 hours.  ProBNP (last 3 results) No results for input(s): PROBNP in the last 8760 hours.  CBG: Recent  Labs  Lab 12/20/18 2007 12/20/18 2338 12/21/18 0356 12/21/18 0745 12/21/18 1140  GLUCAP 190* 169* 109* 124* 239*   Recent Results (from the past 240 hour(s))  SARS Coronavirus 2 (CEPHEID - Performed in [facility] hospital lab), Hosp Order     Status: None   Collection Time: 12/15/18  3:21 PM  Result Value Ref Range Status   SARS Coronavirus 2 NEGATIVE NEGATIVE Final    Comment: (NOTE) If result is NEGATIVE SARS-CoV-2 target nucleic acids are NOT DETECTED. The SARS-CoV-2 RNA is generally detectable in upper and lower  respiratory specimens during the acute phase of infection. The lowest  concentration of SARS-CoV-2 viral copies this assay can detect is 250  copies / mL. A negative result does not preclude SARS-CoV-2 infection  and should not be used as the sole basis for treatment or other  patient management decisions.  A negative result may occur with  improper specimen collection / handling, submission of specimen other  than nasopharyngeal swab,  presence of viral mutation(s) within the  areas targeted by this assay, and inadequate number of viral copies  (<250 copies / mL). A negative result must be combined with clinical  observations, patient history, and epidemiological information. If result is POSITIVE SARS-CoV-2 target nucleic acids are DETECTED. The SARS-CoV-2 RNA is generally detectable in upper and lower  respiratory specimens dur ing the acute phase of infection.  Positive  results are indicative of active infection with SARS-CoV-2.  Clinical  correlation with patient history and other diagnostic information is  necessary to determine patient infection status.  Positive results do  not rule out bacterial infection or co-infection with other viruses. If result is PRESUMPTIVE POSTIVE SARS-CoV-2 nucleic acids MAY BE PRESENT.   A presumptive positive result was obtained on the submitted specimen  and confirmed on repeat testing.  While 2019 novel coronavirus   (SARS-CoV-2) nucleic acids may be present in the submitted sample  additional confirmatory testing may be necessary for epidemiological  and / or clinical management purposes  to differentiate between  SARS-CoV-2 and other Sarbecovirus currently known to infect humans.  If clinically indicated additional testing with an alternate test  methodology [phone]) is advised. The SARS-CoV-2 RNA is generally  detectable in upper and lower respiratory sp ecimens during the acute  phase of infection. The expected result is Negative. Fact Sheet for Patients:  BoilerBrush.com.cy Fact Sheet for Healthcare Providers: https://pope.com/ This test is not yet approved or cleared by the [place] FDA and has been authorized for detection and/or diagnosis of SARS-CoV-2 by FDA under an Emergency Use Authorization (EUA).  This EUA will remain in effect (meaning this test can be used) for the duration of the COVID-19 declaration under Section 564(b)(1) of the Act, 21 U.S.C. section 360bbb-3(b)(1), unless the authorization is terminated or revoked sooner. Performed at [facility], [address], [place], [place] [address]      Studies: Dg Abd Portable 1v  Result Date: 12/20/2018 CLINICAL DATA:  Abdomen pain. SBO. EXAM: PORTABLE ABDOMEN - 1 VIEW COMPARISON:  12/19/2018. FINDINGS: There has been some partial clearing of enteric contrast from the colon, presumed evacuation. There are no dilated small bowel loops, and the colon is less distended with fecal material and contrast than was observed on yesterday's radiograph. Previous lumbar instrumentation appears unchanged. Cholecystectomy clips. Nasogastric tube is seen in the stomach. No acute osseous findings. No visible free air on this portable supine radiograph. IMPRESSION: No evidence for small bowel obstruction. Electronically Signed   By: Elsie Stain M.D.   On: 12/20/2018 09:11    Scheduled  Meds: . feeding supplement (ENSURE ENLIVE)  237 mL Oral BID BM  . insulin aspart  0-15 Units Subcutaneous TID AC & HS  . insulin glargine  20 Units Subcutaneous Q2200  . [START ON 12/22/2018] levothyroxine  150 mcg Oral QAC breakfast  . rivaroxaban  20 mg Oral Q supper    Continuous Infusions: . sodium chloride 150 mL/hr at 12/21/18 0844  . sodium chloride Stopped (12/20/18 1547)     Alvira Philips [place], DO  [facility] 12/21/2018

## 2018-12-21 NOTE — Progress Notes (Addendum)
ANTICOAGULATION CONSULT NOTE - Initial Consult  Pharmacy Consult for Lovenox  Indication: VTE treatment;  On Xarelto PTA for h/o lower leg DVT x2  Allergies  Allergen Reactions  . Codeine Nausea And Vomiting  . Ambien [Zolpidem Tartrate] Other (See Comments)    Up sleep walking and eating  . Crestor [Rosuvastatin] Other (See Comments)    Muscle weakness, cramps and aching all over body  . Lipitor [Atorvastatin] Itching and Other (See Comments)    Muscle weakness, cramps and aches all over body  . Morphine And Related Other (See Comments)    Flushing and feeling hot Tolerates with Diphenhydramine  . Penicillins Rash and Other (See Comments)    Caused Headaches also Has patient had a PCN reaction causing immediate rash, facial/tongue/throat swelling, SOB or lightheadedness with hypotension: No Has patient had a PCN reaction causing severe rash involving mucus membranes or skin necrosis: No Has patient had a PCN reaction that required hospitalization No Has patient had a PCN reaction occurring within the last 10 years: No If all of the above answers are "NO", then may proceed with Cephalosporin use.    . Statins Other (See Comments)    Muscle weakness, cramps and aching all over body  . Dilaudid [Hydromorphone Hcl] Other (See Comments)    Hallucinations/ argumentative, goes beserk  . Heparin Other (See Comments)    HIT ab positive  (SRA negative 11/25/17)   . Amitriptyline Other (See Comments)    Loopy feeling  . Ceftin [Cefuroxime Axetil] Other (See Comments)    Stomach pain   . Lovaza [Omega-3-Acid Ethyl Esters] Other (See Comments)    Stomach pain   . Lunesta [Eszopiclone] Other (See Comments)    Causes dizziness  . Lyrica [Pregabalin] Nausea Only  . Metformin And Related Nausea Only    Patient Measurements: Height: 5\' 3"  (160 cm)(from 05/15/2018 records) Weight: 212 lb 4.9 oz (96.3 kg) IBW/kg (Calculated) : 52.4   Vital Signs:Temp: 98.3 F (36.8 C) (05/15 0354) Temp  Source: Oral (05/15 0354) BP: 159/65 (05/15 0354) Pulse Rate: 86 (05/15 0354)  Labs: Recent Labs    12/20/18 0352 12/21/18 0328  CREATININE 0.94 0.99    Estimated Creatinine Clearance: 60.1 mL/min (by C-G formula based on SCr of 0.99 mg/dL).   Medical History: Past Medical History:  Diagnosis Date  . Anemia    iron deficiency  . Arthritis   . Blood transfusion   . Cervical spondylosis with myelopathy and radiculopathy   . Depression   . Diabetes mellitus    Type II  . Diastolic dysfunction   . Diverticulitis   . DJD (degenerative joint disease)   . DVT (deep venous thrombosis) (HCC)    times 2 lower leg  . Endometriosis   . Family history of adverse reaction to anesthesia    mom and dad PONV  . Fibromyalgia   . GERD (gastroesophageal reflux disease)    occ  . Hiatal hernia   . History of kidney stones   . Hyperlipidemia   . Hypertension   . Hypothyroidism   . Insomnia   . Mild aortic insufficiency    by echo 04/2013  . Osteoarthritis    back and knee  . Ovarian cyst   . PONV (postoperative nausea and vomiting)   . RLS (restless legs syndrome)   . Thoracic compression fracture ([place])   . Uterine fibroid   . UTI (lower urinary tract infection)   . Villous adenoma of right colon 04/08/2016  . Vitamin  D deficiency disease     Medications:  Medications Prior to Admission  Medication Sig Dispense Refill Last Dose  . acetaminophen (TYLENOL) 500 MG tablet Take 1,000 mg by mouth every 8 (eight) hours as needed for mild pain or headache.   12/14/2018 at Unknown time  . amLODipine (NORVASC) 2.5 MG tablet Take 2.5 mg by mouth daily.   12/14/2018 at am  . aspirin EC 81 MG tablet Take 81 mg by mouth daily.   12/14/2018 at am  . clobetasol (TEMOVATE) 0.05 % external solution Apply 1 application topically See admin instructions. Apply twice daily to scalp and ear as needed for itching/rash (do not apply to face/groin/underarms)  3 unknown  . Cyanocobalamin (B-12) 2500 MCG TABS  Take 2,500 mcg by mouth daily.   12/14/2018 at am  . cyclobenzaprine (FLEXERIL) 10 MG tablet Take 1 tablet (10 mg total) by mouth 3 (three) times daily as needed for muscle spasms. 30 tablet 0 12/14/2018 at Unknown time  . dexlansoprazole (DEXILANT) 60 MG capsule Take 60 mg by mouth daily.   12/14/2018 at am  . docusate sodium (COLACE) 100 MG capsule Take 1 capsule (100 mg total) by mouth 2 (two) times daily. 60 capsule 0 12/14/2018 at pm  . DULoxetine (CYMBALTA) 60 MG capsule Take 60 mg by mouth daily.    12/14/2018 at am  . ferrous sulfate 325 (65 FE) MG tablet Take 1 tablet (325 mg total) by mouth 2 (two) times daily with a meal. 60 tablet 3 12/14/2018 at pm  . fluticasone (FLONASE) 50 MCG/ACT nasal spray Place 2 sprays into both nostrils daily as needed for allergies or rhinitis.   12/14/2018 at Unknown time  . furosemide (LASIX) 20 MG tablet Take 20 mg by mouth daily as needed for fluid or edema.    few days ago  . gabapentin (NEURONTIN) 300 MG capsule Take 600 mg by mouth at bedtime.   1 12/14/2018 at pm  . ibuprofen (ADVIL) 200 MG tablet Take 400 mg by mouth every 6 (six) hours as needed for headache (pain).   few days ago  . insulin degludec (TRESIBA FLEXTOUCH) 100 UNIT/ML SOPN FlexTouch Pen Inject 40 Units into the skin daily.   12/15/2018 at 1400  . insulin lispro (HUMALOG KWIKPEN) 100 UNIT/ML KwikPen Inject 14 Units into the skin 3 (three) times daily.   12/15/2018 at 1400  . irbesartan (AVAPRO) 75 MG tablet Take 75 mg by mouth daily.   12/14/2018 at am  . levothyroxine (SYNTHROID, LEVOTHROID) 150 MCG tablet Take 150 mcg by mouth daily before breakfast.  5 12/14/2018 at am  . LORazepam (ATIVAN) 0.5 MG tablet Take 1 tablet (0.5 mg total) by mouth every 8 (eight) hours as needed for anxiety. 30 tablet 0 2-3 months ago  . meclizine (ANTIVERT) 12.5 MG tablet Take 25 mg by mouth 3 (three) times daily as needed for dizziness.   12/15/2018 at afternoon  . Pitavastatin Calcium (LIVALO) 2 MG TABS Take 2 mg by mouth every  Wednesday.   4/29 or 5/6  . Probiotic Product (PROBIOTIC PO) Take 1 capsule by mouth daily.   few days ago at am  . promethazine (PHENERGAN) 25 MG tablet Take 25 mg by mouth every 8 (eight) hours as needed for nausea or vomiting.   12/15/2018 at afternoon  . rivaroxaban (XARELTO) 20 MG TABS tablet Take 20 mg by mouth daily.   12/14/2018 at 8:30-10am  . temazepam (RESTORIL) 15 MG capsule Take 15 mg by mouth at bedtime.  4 12/14/2018 at pm  . insulin aspart protamine- aspart (NOVOLOG MIX 70/30) (70-30) 100 UNIT/ML injection Inject 0.38 mLs (38 Units total) into the skin 2 (two) times daily with a meal. (Patient not taking: Reported on 12/15/2018) 10 mL 11 Not Taking at Unknown time  . oxyCODONE (OXY IR/ROXICODONE) 5 MG immediate release tablet Take 1 tablet (5 mg total) by mouth every 4 (four) hours as needed for moderate pain ((score 4 to 6)). (Patient not taking: Reported on 12/15/2018) 30 tablet 0 Not Taking at Unknown time  . oxyCODONE-acetaminophen (PERCOCET/ROXICET) 5-325 MG tablet Take 1 tablet by mouth every 6 (six) hours as needed (pain).       Scheduled:  . enoxaparin (LOVENOX) injection  1 mg/kg Subcutaneous Q12H  . insulin aspart  0-15 Units Subcutaneous Q4H  . insulin glargine  20 Units Subcutaneous Q2200  . [START ON 12/22/2018] levothyroxine  150 mcg Oral QAC breakfast  . potassium chloride  40 mEq Oral Q3H    Assessment: 68 y.o female patient who was on Xarelto PTA for h/o lower leg DVT x2,  H/o HIT Ab positive 11/23/2017  ( SRA negative 11/25/17)  pt to ED 5/9 with LLQ abd pain, N/V.  CT scan revealed SBO. Patient with NGT being clamped and CLD starting.    Wt 94 kg,  crcl ~60 ml/min   Pta Xarelto 20mg  daily , LD taken 5/8 @  0830-10 AM.   Goal of Therapy:  Monitor platelets by anticoagulation protocol: Yes   Plan:  Continue Lovenox 90 mg SQ q12h  F/u for restart of PTA Xarelto   [name] A. Levada Dy, PharmD, [place] Please utilize Amion for appropriate  phone number to reach the unit pharmacist ([place])   Addendum:   D/w Dr. [place] Patient's diet advanced enough to convert to home xarelto from enoxaparin. Will start xarelto tonight.   Kiwan Gadsden A. Levada Dy, PharmD, [place] Please utilize Amion for appropriate phone number to reach the unit pharmacist ([place])    12/21/2018,7:55 AM

## 2018-12-21 NOTE — Progress Notes (Signed)
Physical Therapy Treatment Patient Details Name: [name] MRN: [id number] DOB: 05-29-1951 Today's Date: 12/21/2018    History of Present Illness [name] is a 68 y.o. female with past medical history of diabetes mellitus currently on insulin, GERD, hypertension, hyperlipidemia, hypothyroidism, history of stroke, diastolic dysfunction, fibromyalgia and prior history of DVT on Xarelto, multiple abdominal surgeries presented to the hospital with complaints of abdominal pain; SBO initiating conservative small bowel protocol    PT Comments    Patient doing well with therapy today, ambulating further desistance without use of IV pole. Did have one LOB able to self correct, VSS on RA, will see in the AM prior to d/c to ensure safety.   Follow Up Recommendations  No PT follow up(I anticipate good recovery)     Equipment Recommendations       Recommendations for Other Services       Precautions / Restrictions Precautions Precaution Comments: Encourage self-monitoring for activity tolernace    Mobility  Bed Mobility   Bed Mobility: Supine to Sit     Supine to sit: Supervision     General bed mobility comments: Supervision for safety and management of lines  Transfers Overall transfer level: Needs assistance Equipment used: None Transfers: Sit to/from Stand Sit to Stand: Min guard         General transfer comment: [place] for safety  Ambulation/Gait Ambulation/Gait assistance: Min guard Gait Distance (Feet): 200 Feet Assistive device: None(and occasional use of hallway rail) Gait Pattern/deviations: Step-through pattern Gait velocity: decrease   General Gait Details: 1 LOB able to self correct, otherwise VSS, no issue ambulating,   Stairs             Wheelchair Mobility    Modified Rankin (Stroke Patients Only)       Balance Overall balance assessment: Mild deficits observed, not formally tested                                           Cognition Arousal/Alertness: Awake/alert Behavior During Therapy: WFL for tasks assessed/performed Overall Cognitive Status: Within Functional Limits for tasks assessed                                        Exercises      General Comments        Pertinent Vitals/Pain      Home Living                      Prior Function            PT Goals (current goals can now be found in the care plan section) Acute Rehab PT Goals Patient Stated Goal: feel better and get home PT Goal Formulation: With patient Time For Goal Achievement: 12/30/18 Potential to Achieve Goals: Good    Frequency    Min 3X/week      PT Plan Current plan remains appropriate    Co-evaluation              AM-PAC PT "6 Clicks" Mobility   Outcome Measure  Help needed turning from your back to your side while in a flat bed without using bedrails?: None Help needed moving from lying on your back to sitting on the side of a flat bed without  using bedrails?: None Help needed moving to and from a bed to a chair (including a wheelchair)?: None Help needed standing up from a chair using your arms (e.g., wheelchair or bedside chair)?: None Help needed to walk in hospital room?: A Little Help needed climbing 3-5 steps with a railing? : A Little 6 Click Score: 22    End of Session   Activity Tolerance: Patient tolerated treatment well Patient left: in bed;with call bell/phone within reach Nurse Communication: Mobility status;Other (comment)(IV beeping) PT Visit Diagnosis: Unsteadiness on feet (R26.81);Other (comment)(decr activity tolerance)     Time: 1000-1015 PT Time Calculation (min) (ACUTE ONLY): 15 min  Charges:  $Gait Training: 8-22 mins                    Reinaldo Berber, PT, DPT Acute Rehabilitation Services Pager: [phone] Office: [place] 12/21/2018, 10:07 AM

## 2018-12-21 NOTE — Progress Notes (Signed)
[facility] Progress Note     Subjective: CC-  Abdominal pain has resolved. Distention resolved; denies n/v. Continues to pass flatus and had 2 BMs. Tolerating clears.  Objective: Vital signs in last 24 hours: Temp:  [98.1 F (36.7 C)-98.4 F (36.9 C)] 98.3 F (36.8 C) (05/15 0354) Pulse Rate:  [74-93] 86 (05/15 0354) Resp:  [18-20] 20 (05/15 0354) BP: (159-185)/(65-89) 159/65 (05/15 0354) SpO2:  [97 %-100 %] 97 % (05/15 0354) Weight:  [96.3 kg] 96.3 kg (05/15 0323) Last BM Date: 12/20/18  Intake/Output from previous day: 05/14 0701 - 05/15 0700 In: 4262.5 [P.O.:1340; I.V.:2922.5] Out: 1750 [Urine:1750] Intake/Output this shift: No intake/output data recorded.  PE: Gen:  Alert, NAD HEENT: EOM's intact, pupils equal and round Card:  tachy Pulm:  CTAB, no W/R/R, effort normal Abd: Soft, nontender, not significantly distended. Ext:  Calves soft and nontender Psych: A&Ox3  Skin: no rashes noted, warm and dry  Lab Results:  No results for input(s): WBC, HGB, HCT, PLT in the last 72 hours. BMET Recent Labs    12/20/18 0352 12/21/18 0328  NA 139 135  K 2.7* 3.1*  CL 105 110  CO2 20* 13*  GLUCOSE 98 128*  BUN 7* 6*  CREATININE 0.94 0.99  CALCIUM 8.0* 7.9*   PT/INR No results for input(s): LABPROT, INR in the last 72 hours. CMP     Component Value Date/Time   NA 135 12/21/2018 0328   K 3.1 (L) 12/21/2018 0328   CL 110 12/21/2018 0328   CO2 13 (L) 12/21/2018 0328   GLUCOSE 128 (H) 12/21/2018 0328   BUN 6 (L) 12/21/2018 0328   CREATININE 0.99 12/21/2018 0328   CALCIUM 7.9 (L) 12/21/2018 0328   PROT 6.5 12/16/2018 0637   ALBUMIN 3.6 12/16/2018 0637   AST 12 (L) 12/16/2018 0637   ALT 12 12/16/2018 0637   ALKPHOS 114 12/16/2018 0637   BILITOT 0.8 12/16/2018 0637   GFRNONAA 59 (L) 12/21/2018 0328   GFRAA >60 12/21/2018 0328   Lipase     Component Value Date/Time   LIPASE 22 12/15/2018 1521       Studies/Results: Dg Abd Portable  1v  Result Date: 12/20/2018 CLINICAL DATA:  Abdomen pain. SBO. EXAM: PORTABLE ABDOMEN - 1 VIEW COMPARISON:  12/19/2018. FINDINGS: There has been some partial clearing of enteric contrast from the colon, presumed evacuation. There are no dilated small bowel loops, and the colon is less distended with fecal material and contrast than was observed on yesterday's radiograph. Previous lumbar instrumentation appears unchanged. Cholecystectomy clips. Nasogastric tube is seen in the stomach. No acute osseous findings. No visible free air on this portable supine radiograph. IMPRESSION: No evidence for small bowel obstruction. Electronically Signed   By: Staci Righter M.D.   On: 12/20/2018 09:11    Anti-infectives: Anti-infectives (From admission, onward)   None       Assessment/Plan HTN DM-II Diastolic dysfunction Hypothyroidism Fibromyalgia H/o stroke Obestity H/o DVT on Xarelto at home (last dose 5/8) - on therapeutic lovenox this admission AKI - Cr 1, resolved  SBO - h/o tubal ligation, appendectomy, c section x2, open cholecystectomy, laparoscopic right colectomy for villous adenoma 04/08/2016 - Xray showed ~resolution. Clinically appears to have resolved as well -Advanced to full liquids - if tolerates, ok for soft diet; can go home once tolerating soft diet  ID - none FEN - IVF - can likely d/c given toleration of liquids; will defer to medicine service VTE - lovenox 90mg  BID Foley -  none Follow up - TBD   LOS: 6 days   Ileana Roup, [facility] 12/21/2018, 8:16 AM

## 2018-12-22 LAB — BASIC METABOLIC PANEL
Anion gap: 9 (ref 5–15)
BUN: <5 mg/dL — ABNORMAL LOW (ref 8–23)
CO2: 20 mmol/L — ABNORMAL LOW (ref 22–32)
Calcium: 8.6 mg/dL — ABNORMAL LOW (ref 8.9–10.3)
Chloride: 110 mmol/L (ref 98–111)
Creatinine, Ser: 0.81 mg/dL (ref 0.44–1.00)
GFR calc Af Amer: >60 mL/min (ref >60)
GFR calc non Af Amer: >60 mL/min (ref >60)
Glucose, Bld: 134 mg/dL — ABNORMAL HIGH (ref 70–99)
Potassium: 3 mmol/L — ABNORMAL LOW (ref 3.5–5.1)
Sodium: 139 mmol/L (ref 135–145)

## 2018-12-22 LAB — GLUCOSE, CAPILLARY
Glucose-Capillary: 108 mg/dL — ABNORMAL HIGH (ref 70–99)
Glucose-Capillary: 269 mg/dL — ABNORMAL HIGH (ref 70–99)

## 2018-12-22 MED ORDER — POTASSIUM CHLORIDE CRYS ER 20 MEQ PO TBCR
40.0000 meq | EXTENDED_RELEASE_TABLET | Freq: Once | ORAL | Status: AC
  Administered 2018-12-22: 40 meq via ORAL
  Filled 2018-12-22: qty 2

## 2018-12-22 MED ORDER — IRBESARTAN 75 MG PO TABS
75.0000 mg | ORAL_TABLET | Freq: Every day | ORAL | Status: DC
  Administered 2018-12-22: 75 mg via ORAL
  Filled 2018-12-22: qty 1

## 2018-12-22 MED ORDER — AMLODIPINE BESYLATE 5 MG PO TABS
2.5000 mg | ORAL_TABLET | Freq: Every day | ORAL | Status: DC
  Administered 2018-12-22: 2.5 mg via ORAL
  Filled 2018-12-22: qty 1

## 2018-12-22 NOTE — Progress Notes (Signed)
DISCHARGE NOTE HOME KAY[facility] POSS to be discharged Home per MD order. Discussed prescriptions and follow up appointments with the patient. Prescriptions given to patient; medication list explained in detail. Patient verbalized understanding.  Skin clean, dry and intact without evidence of skin break down, no evidence of skin tears noted. IV catheter discontinued intact. Site without signs and symptoms of complications. Dressing and pressure applied. Pt denies pain at the site currently. No complaints noted.  Patient free of lines, drains, and wounds.   An After Visit Summary (AVS) was printed and given to the patient. Patient escorted via wheelchair, and discharged home via private auto.  Orville Govern, RN

## 2018-12-22 NOTE — Discharge Instructions (Signed)
Bowel Obstruction  A bowel obstruction is a blockage in the small or large bowel. The bowel, which is also called the intestine, is a long, slender tube that connects the stomach to the anus. When a person eats and drinks, food and fluids go from the mouth to the stomach to the small bowel. This is where most of the nutrients in the food and fluids are absorbed. After the small bowel, material passes through the large bowel for further absorption until any leftover material leaves the body as stool through the anus during a bowel movement.  A bowel obstruction will prevent food and fluids from passing through the bowel as they normally do during digestion. The bowel can become partially or completely blocked. If this condition is not treated, it can be dangerous because the bowel could rupture.  What are the causes?  Common causes of this condition include:  · Scar tissue (adhesions) from previous surgery or treatment with high-energy X-rays (radiation).  · Recent surgery. This may cause the movements of the bowel to slow down and cause food to block the intestine.  · Inflammatory bowel disease, such asCrohn's disease or diverticulitis.  · Growths or tumors.  · A bulging organ (hernia).  · Twisting of the bowel (volvulus).  · A foreign body.  · Slipping of a part of the bowel into another part (intussusception).  What are the signs or symptoms?  Symptoms of this condition include:  · Pain in the abdomen. Depending on the degree of obstruction, pain may be:  ? Mild or severe.  ? Dull cramping or sharp pain.  ? In one area or in the entire abdomen.  · Nausea and vomiting. Vomit may be greenish or a yellow bile color.  · Bloating in the abdomen.  · Difficulty passing stool (constipation).  · Lack of passing gas.  · Frequent belching.  · Diarrhea. This may occur if the obstruction is partial and runny stool is able to leak around the obstruction.  How is this diagnosed?  This condition may be diagnosed based on:  · A  physical exam.  · Medical history.  · Imaging tests of the abdomen or pelvis, such as X-ray or CT scan.  · Blood or urine tests.  How is this treated?  Treatment for this condition depends on the cause and severity of the problem. Treatment may include:  · Fluids and pain medicines that are given through an IV. Your health care provider may instruct you not to eat or drink if you have nausea or vomiting.  · Eating a simple diet. You may be asked to consume a clear liquid diet for several days. This allows the bowel to rest.  · Placement of a small tube (nasogastric tube) into the stomach. This will relieve pain, discomfort, and nausea by removing blocked air and fluids from the stomach. It can also help the obstruction clear up faster.  · Surgery. This may be required if other treatments do not work. Surgery may be required for:  ? Bowel obstruction from a hernia. This can be an emergency procedure.  ? Scar tissue that causes frequent or severe obstructions.  Follow these instructions at home:  Medicines  · Take over-the-counter and prescription medicines only as told by your health care provider.  · If you were prescribed an antibiotic medicine, take it as told by your health care provider. Do not stop taking the antibiotic even if you start to feel better.  General instructions  ·   what activities are safe for you.  Avoid sitting for a long time without moving. Get up to take short walks every 1-2 hours. This is important to improve blood flow and breathing. Ask for help if you feel weak or unsteady.  Keep all follow-up visits as told by your health care provider. This is important. How is this prevented? After having a bowel  obstruction, you are more likely to have another. You may do the following things to prevent another obstruction:  If you have a long-term (chronic) disease, pay attention to your symptoms and contact your health care provider if you have questions or concerns.  Avoid becoming constipated. To prevent or treat constipation, your health care provider may recommend that you: ? Drink enough fluid to keep your urine pale yellow. ? Take over-the-counter or prescription medicines. ? Eat foods that are high in fiber, such as beans, whole grains, and fresh fruits and vegetables. ? Limit foods that are high in fat and processed sugars, such as fried or sweet foods.  Stay active. Exercise for 30 minutes or more, 5 or more days each week. Ask your health care provider which exercises are safe for you.  Avoid stress. Find ways to reduce stress, such as meditation, exercise, or taking time for activities that relax you.  Instead of eating three large meals each day, eat three small meals with three small snacks.  Work with a Microbiologist to make a healthy meal plan that works for you.  Do not use any products that contain nicotine or tobacco, such as cigarettes and e-cigarettes. If you need help quitting, ask your health care provider. Contact a health care provider if you:  Have a fever.  Have chills. Get help right away if you:  Have increased pain or cramping.  Vomit blood.  Have uncontrolled vomiting or nausea.  Cannot drink fluids because of vomiting or pain.  Become confused.  Begin feeling very thirsty (dehydrated).  Have severe bloating.  Feel extremely weak or you faint. Summary  A bowel obstruction is a blockage in the small or large bowel.  A bowel obstruction will prevent food and fluids from passing through the bowel as they normally do during digestion.  Treatment for this condition depends on the cause and severity of the problem. It may include fluids and pain medicines  through an IV, a simple diet, a nasogastric tube, or surgery.  Follow instructions from your health care provider about eating restrictions. You may need to avoid solid foods and consume only clear liquids until your condition improves. This information is not intended to replace advice given to you by your health care provider. Make sure you discuss any questions you have with your health care provider. Document Released: 10/11/2005 Document Revised: 12/06/2017 Document Reviewed: 12/06/2017 Elsevier Interactive Patient Education  2019 [employer]  High-Fiber Diet Fiber, also called dietary fiber, is a type of carbohydrate that is found in fruits, vegetables, whole grains, and beans. A high-fiber diet can have many health benefits. Your health care provider may recommend a high-fiber diet to help:  Prevent constipation. Fiber can make your bowel movements more regular.  Lower your cholesterol.  Relieve the following conditions: ? Swelling of veins in the anus (hemorrhoids). ? Swelling and irritation (inflammation) of specific areas of the digestive tract (uncomplicated diverticulosis). ? A problem of the large intestine (colon) that sometimes causes pain and diarrhea (irritable bowel syndrome, IBS).  Prevent overeating as part of a weight-loss plan.  Prevent heart disease,  type 2 diabetes, and certain cancers. What is my plan? The recommended daily fiber intake in grams (g) includes:  38 g for men age 40 or younger.  30 g for men over age 39.  63 g for women age 73 or younger.  21 g for women over age 73. You can get the recommended daily intake of dietary fiber by:  Eating a variety of fruits, vegetables, grains, and beans.  Taking a fiber supplement, if it is not possible to get enough fiber through your diet. What do I need to know about a high-fiber diet?  It is better to get fiber through food sources rather than from fiber supplements. There is not a lot of research  about how effective supplements are.  Always check the fiber content on the nutrition facts label of any prepackaged food. Look for foods that contain 5 g of fiber or more per serving.  Talk with a diet and nutrition specialist (dietitian) if you have questions about specific foods that are recommended or not recommended for your medical condition, especially if those foods are not listed below.  Gradually increase how much fiber you consume. If you increase your intake of dietary fiber too quickly, you may have bloating, cramping, or gas.  Drink plenty of water. Water helps you to digest fiber. What are tips for following this plan?  Eat a wide variety of high-fiber foods.  Make sure that half of the grains that you eat each day are whole grains.  Eat breads and cereals that are made with whole-grain flour instead of refined flour or white flour.  Eat brown rice, bulgur wheat, or millet instead of white rice.  Start the day with a breakfast that is high in fiber, such as a cereal that contains 5 g of fiber or more per serving.  Use beans in place of meat in soups, salads, and pasta dishes.  Eat high-fiber snacks, such as berries, raw vegetables, nuts, and popcorn.  Choose whole fruits and vegetables instead of processed forms like juice or sauce. What foods can I eat?  Fruits Berries. Pears. Apples. Oranges. Avocado. Prunes and raisins. Dried figs. Vegetables Sweet potatoes. Spinach. Kale. Artichokes. Cabbage. Broccoli. Cauliflower. Green peas. Carrots. Squash. Grains Whole-grain breads. Multigrain cereal. Oats and oatmeal. Brown rice. Barley. Bulgur wheat. [place]. Quinoa. Bran muffins. Popcorn. Rye wafer crackers. Meats and other proteins Navy, kidney, and pinto beans. Soybeans. Split peas. Lentils. Nuts and seeds. Dairy Fiber-fortified yogurt. Beverages Fiber-fortified soy milk. Fiber-fortified orange juice. Other foods Fiber bars. The items listed above may not be a  complete list of recommended foods and beverages. Contact a dietitian for more options. What foods are not recommended? Fruits Fruit juice. Cooked, strained fruit. Vegetables Fried potatoes. Canned vegetables. Well-cooked vegetables. Grains White bread. Pasta made with refined flour. White rice. Meats and other proteins Fatty cuts of meat. Fried chicken or fried fish. Dairy Milk. Yogurt. Cream cheese. Sour cream. Fats and oils Butters. Beverages Soft drinks. Other foods Cakes and pastries. The items listed above may not be a complete list of foods and beverages to avoid. Contact a dietitian for more information. Summary  Fiber is a type of carbohydrate. It is found in fruits, vegetables, whole grains, and beans.  There are many health benefits of eating a high-fiber diet, such as preventing constipation, lowering blood cholesterol, helping with weight loss, and reducing your risk of heart disease, diabetes, and certain cancers.  Gradually increase your intake of fiber. Increasing too fast can  result in cramping, bloating, and gas. Drink plenty of water while you increase your fiber.  The best sources of fiber include whole fruits and vegetables, whole grains, nuts, seeds, and beans. This information is not intended to replace advice given to you by your health care provider. Make sure you discuss any questions you have with your health care provider. Document Released: 07/25/2005 Document Revised: 05/29/2017 Document Reviewed: 05/29/2017 Elsevier Interactive Patient Education  2019 [place] A soft-food eating plan includes foods that are safe and easy to chew and swallow. Your health care provider or dietitian can help you find foods and flavors that fit into this plan. Follow this plan until your health care provider or dietitian says it is safe to start eating other foods and food textures. What are tips for following this plan? General  guidelines   Take small bites of food, or cut food into pieces about  inch or smaller. Bite-sized pieces of food are easier to chew and swallow.  Eat moist foods. Avoid overly dry foods.  Avoid foods that: ? Are difficult to swallow, such as dry, chunky, crispy, or sticky foods. ? Are difficult to chew, such as hard, tough, or stringy foods. ? Contain nuts, seeds, or fruits.  Follow instructions from your dietitian about the types of liquids that are safe for you to swallow. You may be allowed to have: ? Thick liquids only. This includes only liquids that are thicker than honey. ? Thin and thick liquids. This includes all beverages and foods that become liquid at room temperature.  To make thick liquids: ? Purchase a commercial liquid thickening powder. These are available at grocery stores and pharmacies. ? Mix the thickener into liquids according to instructions on the label. ? Purchase ready-made thickened liquids. ? Thicken soup by pureeing, straining to remove chunks, and adding flour, potato flakes, or corn starch. ? Add commercial thickener to foods that become liquid at room temperature, such as milk shakes, yogurt, ice cream, gelatin, and sherbet.  Ask your health care provider whether you need to take a fiber supplement. Cooking  Cook meats so they stay tender and moist. Use methods like braising, stewing, or baking in liquid.  Cook vegetables and fruit until they are soft enough to be mashed with a fork.  Peel soft, fresh fruits such as peaches, nectarines, and melons.  When making soup, make sure chunks of meat and vegetables are smaller than  inch.  Reheat leftover foods slowly so that a tough crust does not form. What foods are allowed? The items listed below may not be a complete list. Talk with your dietitian about what dietary choices are best for you. Grains Breads, muffins, pancakes, or waffles moistened with syrup, jelly, or butter. Dry cereals  well-moistened with milk. Moist, cooked cereals. Well-cooked pasta and rice. Vegetables All soft-cooked vegetables. Shredded lettuce. Fruits All canned and cooked fruits. Soft, peeled fresh fruits. Strawberries. Dairy Milk. Cream. Yogurt. Cottage cheese. Soft cheese without the rind. Meats and other protein foods Tender, moist ground meat, poultry, or fish. Meat cooked in gravy or sauces. Eggs. Sweets and desserts Ice cream. Milk shakes. Sherbet. Pudding. Fats and oils Butter. Margarine. Olive, canola, sunflower, and grapeseed oil. Smooth salad dressing. Smooth cream cheese. Mayonnaise. Gravy. What foods are not allowed? The items listed bemay not be a complete list. Talk with your dietitian about what dietary choices are best for you. Grains Coarse or dry cereals, such as bran, granola, and shredded wheat.  Tough or chewy crusty breads, such as [place] bread or baguettes. Breads with nuts, seeds, or fruit. Vegetables All raw vegetables. Cooked corn. Cooked vegetables that are tough or stringy. Tough, crisp, fried potatoes and potato skins. Fruits Fresh fruits with skins or seeds, or both, such as apples, pears, and grapes. Stringy, high-pulp fruits, such as papaya, pineapple, coconut, and mango. Fruit leather and all dried fruit. Dairy Yogurt with nuts or coconut. Meats and other protein foods Hard, dry sausages. Dry meat, poultry, or fish. Meats with gristle. Fish with bones. Fried meat or fish. Lunch meat and hotdogs. Nuts and seeds. Chunky peanut butter or other nut butters. Sweets and desserts Cakes or cookies that are very dry or chewy. Desserts with dried fruit, nuts, or coconut. Fried pastries. Very rich pastries. Fats and oils Cream cheese with fruit or nuts. Salad dressings with seeds or chunks. Summary  A soft-food eating plan includes foods that are safe and easy to swallow. Generally, the foods should be soft enough to be mashed with a fork.  Avoid foods that are dry, hard  to chew, crunchy, sticky, stringy, or crispy.  Ask your health care provider whether you need to thicken your liquids and if you need to take a fiber supplement. This information is not intended to replace advice given to you by your health care provider. Make sure you discuss any questions you have with your health care provider. Document Released: 11/01/2007 Document Revised: 09/27/2016 Document Reviewed: 09/27/2016 Elsevier Interactive Patient Education  2019 [employer].

## 2018-12-22 NOTE — Progress Notes (Signed)
Physical Therapy Treatment Patient Details Name: [name] MRN: [id number] DOB: 04-16-1951 Today's Date: 12/22/2018    History of Present Illness [name] is a 68 y.o. female with past medical history of diabetes mellitus currently on insulin, GERD, hypertension, hyperlipidemia, hypothyroidism, history of stroke, diastolic dysfunction, fibromyalgia and prior history of DVT on Xarelto, multiple abdominal surgeries presented to the hospital with complaints of abdominal pain; SBO initiating conservative small bowel protocol    PT Comments    Patient doing well today, session focused on stair for safe home entry. VSS on RA. Pt educated on safety considerations for home and have no further questions or concerns at this time. Pt has met all functional goals.   Follow Up Recommendations  No PT follow up(I anticipate good recovery)     Equipment Recommendations       Recommendations for Other Services       Precautions / Restrictions Precautions Precaution Comments: Encourage self-monitoring for activity tolernace Restrictions Weight Bearing Restrictions: No    Mobility  Bed Mobility   Bed Mobility: Supine to Sit     Supine to sit: Supervision     General bed mobility comments: Supervision for safety and management of lines  Transfers Overall transfer level: Needs assistance Equipment used: None Transfers: Sit to/from Stand Sit to Stand: Min guard         General transfer comment: [place] for safety  Ambulation/Gait Ambulation/Gait assistance: Min guard Gait Distance (Feet): 100 Feet Assistive device: None(and occasional use of hallway rail) Gait Pattern/deviations: Step-through pattern Gait velocity: decrease   General Gait Details: improved balance today   Stairs Stairs: Yes Stairs assistance: Min assist Stair Management: One rail Right Number of Stairs: 5 General stair comments: alternating pattern no LOB. discussed step to step for ease without  rail, family will assist in, guarding discussed    Wheelchair Mobility    Modified Rankin (Stroke Patients Only)       Balance Overall balance assessment: Mild deficits observed, not formally tested                                          Cognition Arousal/Alertness: Awake/alert Behavior During Therapy: WFL for tasks assessed/performed Overall Cognitive Status: Within Functional Limits for tasks assessed                                        Exercises      General Comments        Pertinent Vitals/Pain      Home Living                      Prior Function            PT Goals (current goals can now be found in the care plan section) Acute Rehab PT Goals Patient Stated Goal: feel better and get home PT Goal Formulation: With patient Time For Goal Achievement: 12/30/18 Potential to Achieve Goals: Good Progress towards PT goals: Goals met/education completed, patient discharged from PT    Frequency    Min 3X/week      PT Plan Current plan remains appropriate    Co-evaluation              AM-PAC PT "6 Clicks" Mobility   Outcome Measure  Help needed turning from your back to your side while in a flat bed without using bedrails?: None Help needed moving from lying on your back to sitting on the side of a flat bed without using bedrails?: None Help needed moving to and from a bed to a chair (including a wheelchair)?: None Help needed standing up from a chair using your arms (e.g., wheelchair or bedside chair)?: None Help needed to walk in hospital room?: A Little Help needed climbing 3-5 steps with a railing? : A Little 6 Click Score: 22    End of Session   Activity Tolerance: Patient tolerated treatment well Patient left: in bed;with call bell/phone within reach Nurse Communication: Mobility status;Other (comment)(IV beeping) PT Visit Diagnosis: Unsteadiness on feet (R26.81);Other (comment)(decr  activity tolerance)     Time: 1030-1050 PT Time Calculation (min) (ACUTE ONLY): 20 min  Charges:  $Gait Training: 8-22 mins                     Reinaldo Berber, PT, DPT Acute Rehabilitation Services Pager: [phone] Office: [phone]    Reinaldo Berber 12/22/2018, 11:13 AM

## 2018-12-22 NOTE — Discharge Summary (Signed)
Physician Discharge Summary  [name]:[id number] DOB: 05/02/1951 DOA: 12/15/2018  PCP: [name], MD  Admit date: 12/15/2018 Discharge date: 12/22/2018  Admitted From: Home Disposition:  Home  Recommendations for Outpatient Follow-up:  1. Follow up with PCP in 1-2 weeks 2. Hemoglobin A1c 9.3, may need further titration of her home insulin regimen 3. Encouraged dietary changes, general surgery recommend soft diet for 2-4 weeks followed by high-fiber diet.  Home Health: No Equipment/Devices: None  Discharge Condition: Stable CODE STATUS: Full code Diet recommendation: Heart healthy, consistent carbohydrate, general surgery recommends soft diet for 2-4 weeks followed by high-fiber diet    Discharge Diagnoses:  Active Problems:   Type II diabetes mellitus (HCC)   Hypothyroidism   Hypertension   Diastolic dysfunction  History of present illness:  Morgan Tewalt Mooreis a 68 y.o.femalewith past medical history of diabetes mellitus currently on insulin, GERD, hypertension, hyperlipidemia, hypothyroidism, history of stroke, diastolic dysfunction, fibromyalgia, prior history of DVT on Xarelto and multiple abdominal surgeries presented to the hospital with complaints of abdominal pain for 1 day associated with nausea and multiple episodes of vomiting, consideration for few days.  She tried MiraLAX and Dulcolax without relief.    In the ED, patient had a CT scan of the abdomen and pelvis which showed surgical changes in the right midabdomen with changes consistent with adhesions and at least a partial distal small bowel obstruction. Surgery was consulted from the ED and was advised NG tube,IV fluids and conservative management.   Hospital course:  Partial small bowel obstruction likely secondary to adhesions.    CT abdomen/pelvis notable for postsurgical changes right midabdomen consistent with adhesions and at least partial distal small bowel obstruction.  General surgery was  consulted and followed during hospital course.  Patient was supported with NG tube to low wall suction.  Over the next 2 days, patient's bowel sounds returned and her NG tube was clamped.  She was started on a clear liquid diet which she tolerated with return of her bowel functions to include bowel movements.  Her diet was transitioned to full liquid followed by GI soft with good toleration without any nausea/vomiting.  Patient has NG tube was removed and patient stable for discharge home.  Gastroenterology recommends soft diet for 2-4 weeks followed by high-fiber diet.  Recommend follow-up with PCP in 7-10 days following hospitalization.  Mild leukocytosis: Resolved Count on admission 13.3.  Likely reactive.  Now normalized.  Diabetes mellitus type 2  Home regimen includes Tresiba 40 units sq daily and Humalog 14 units 3 times daily AC.   Hemoglobin A1c 9.3, poorly controlled.  Discussed with patient need for reducing carbohydrates in her diet.  Recommend following up with PCP for further titration.  History of hypothyroidism. Continue home levothyroxine  History of diastolic dysfunction.      Resume home furosemide  Essential hypertension.  Resume home amlodipine and irbesartan  GERD: Continue PPI  History of fibromyalgia: Continue home gabapentin  DVT:   Continue Xarelto  Discharge Instructions  Discharge Instructions    Call MD for:  difficulty breathing, headache or visual disturbances   Complete by:  As directed    Call MD for:  persistant dizziness or light-headedness   Complete by:  As directed    Call MD for:  persistant nausea and vomiting   Complete by:  As directed    Call MD for:  severe uncontrolled pain   Complete by:  As directed    Call MD for:  temperature >100.4   Complete by:  As directed    Diet - low sodium heart healthy   Complete by:  As directed    Increase activity slowly   Complete by:  As directed      Allergies as of 12/22/2018       Reactions   Codeine Nausea And Vomiting   Ambien [zolpidem Tartrate] Other (See Comments)   Up sleep walking and eating   Crestor [rosuvastatin] Other (See Comments)   Muscle weakness, cramps and aching all over body   Lipitor [atorvastatin] Itching, Other (See Comments)   Muscle weakness, cramps and aches all over body   Morphine And Related Other (See Comments)   Flushing and feeling hot Tolerates with Diphenhydramine   Penicillins Rash, Other (See Comments)   Caused Headaches also Has patient had a PCN reaction causing immediate rash, facial/tongue/throat swelling, SOB or lightheadedness with hypotension: No Has patient had a PCN reaction causing severe rash involving mucus membranes or skin necrosis: No Has patient had a PCN reaction that required hospitalization No Has patient had a PCN reaction occurring within the last 10 years: No If all of the above answers are "NO", then may proceed with Cephalosporin use.   Statins Other (See Comments)   Muscle weakness, cramps and aching all over body   Dilaudid [hydromorphone Hcl] Other (See Comments)   Hallucinations/ argumentative, goes beserk   Heparin Other (See Comments)   HIT ab positive  (SRA negative 11/25/17)    Amitriptyline Other (See Comments)   Loopy feeling   Ceftin [cefuroxime Axetil] Other (See Comments)   Stomach pain    Lovaza [omega-3-acid Ethyl Esters] Other (See Comments)   Stomach pain    Lunesta [eszopiclone] Other (See Comments)   Causes dizziness   Lyrica [pregabalin] Nausea Only   Metformin And Related Nausea Only      Medication List    STOP taking these medications   insulin aspart protamine- aspart (70-30) 100 UNIT/ML injection Commonly known as:  NOVOLOG MIX 70/30     TAKE these medications   acetaminophen 500 MG tablet Commonly known as:  TYLENOL Take 1,000 mg by mouth every 8 (eight) hours as needed for mild pain or headache.   amLODipine 2.5 MG tablet Commonly known as:  NORVASC Take 2.5  mg by mouth daily.   aspirin EC 81 MG tablet Take 81 mg by mouth daily.   B-12 2500 MCG Tabs Take 2,500 mcg by mouth daily.   clobetasol 0.05 % external solution Commonly known as:  TEMOVATE Apply 1 application topically See admin instructions. Apply twice daily to scalp and ear as needed for itching/rash (do not apply to face/groin/underarms)   cyclobenzaprine 10 MG tablet Commonly known as:  FLEXERIL Take 1 tablet (10 mg total) by mouth 3 (three) times daily as needed for muscle spasms.   dexlansoprazole 60 MG capsule Commonly known as:  DEXILANT Take 60 mg by mouth daily.   docusate sodium 100 MG capsule Commonly known as:  COLACE Take 1 capsule (100 mg total) by mouth 2 (two) times daily.   DULoxetine 60 MG capsule Commonly known as:  CYMBALTA Take 60 mg by mouth daily.   ferrous sulfate 325 (65 FE) MG tablet Take 1 tablet (325 mg total) by mouth 2 (two) times daily with a meal.   fluticasone 50 MCG/ACT nasal spray Commonly known as:  FLONASE Place 2 sprays into both nostrils daily as needed for allergies or rhinitis.   furosemide 20 MG tablet  Commonly known as:  LASIX Take 20 mg by mouth daily as needed for fluid or edema.   gabapentin 300 MG capsule Commonly known as:  NEURONTIN Take 600 mg by mouth at bedtime.   HumaLOG KwikPen 100 UNIT/ML KwikPen Generic drug:  insulin lispro Inject 14 Units into the skin 3 (three) times daily.   ibuprofen 200 MG tablet Commonly known as:  ADVIL Take 400 mg by mouth every 6 (six) hours as needed for headache (pain).   irbesartan 75 MG tablet Commonly known as:  AVAPRO Take 75 mg by mouth daily.   levothyroxine 150 MCG tablet Commonly known as:  SYNTHROID Take 150 mcg by mouth daily before breakfast.   Livalo 2 MG Tabs Generic drug:  Pitavastatin Calcium Take 2 mg by mouth every Wednesday.   LORazepam 0.5 MG tablet Commonly known as:  Ativan Take 1 tablet (0.5 mg total) by mouth every 8 (eight) hours as needed  for anxiety.   meclizine 12.5 MG tablet Commonly known as:  ANTIVERT Take 25 mg by mouth 3 (three) times daily as needed for dizziness.   oxyCODONE 5 MG immediate release tablet Commonly known as:  Oxy IR/ROXICODONE Take 1 tablet (5 mg total) by mouth every 4 (four) hours as needed for moderate pain ((score 4 to 6)).   oxyCODONE-acetaminophen 5-325 MG tablet Commonly known as:  PERCOCET/ROXICET Take 1 tablet by mouth every 6 (six) hours as needed (pain).   PROBIOTIC PO Take 1 capsule by mouth daily.   promethazine 25 MG tablet Commonly known as:  PHENERGAN Take 25 mg by mouth every 8 (eight) hours as needed for nausea or vomiting.   rivaroxaban 20 MG Tabs tablet Commonly known as:  XARELTO Take 20 mg by mouth daily.   temazepam 15 MG capsule Commonly known as:  RESTORIL Take 15 mg by mouth at bedtime.   [name] FlexTouch 100 UNIT/ML Sopn FlexTouch Pen Generic drug:  insulin degludec Inject 40 Units into the skin daily.      Follow-up Information    [facility], [facility]. Call.   Specialty:  General Surgery Why:  As needed Contact information: [address] [place] [address] [phone]        [name], MD. Schedule an appointment as soon as possible for a visit.   Specialty:  Family Medicine Contact information: [phone] [place] [address] [phone]          Allergies  Allergen Reactions  . Codeine Nausea And Vomiting  . Ambien [Zolpidem Tartrate] Other (See Comments)    Up sleep walking and eating  . Crestor [Rosuvastatin] Other (See Comments)    Muscle weakness, cramps and aching all over body  . Lipitor [Atorvastatin] Itching and Other (See Comments)    Muscle weakness, cramps and aches all over body  . Morphine And Related Other (See Comments)    Flushing and feeling hot Tolerates with Diphenhydramine  . Penicillins Rash and Other (See Comments)    Caused Headaches also Has patient had a  PCN reaction causing immediate rash, facial/tongue/throat swelling, SOB or lightheadedness with hypotension: No Has patient had a PCN reaction causing severe rash involving mucus membranes or skin necrosis: No Has patient had a PCN reaction that required hospitalization No Has patient had a PCN reaction occurring within the last 10 years: No If all of the above answers are "NO", then may proceed with Cephalosporin use.    . Statins Other (See Comments)  Muscle weakness, cramps and aching all over body  . Dilaudid [Hydromorphone Hcl] Other (See Comments)    Hallucinations/ argumentative, goes beserk  . Heparin Other (See Comments)    HIT ab positive  (SRA negative 11/25/17)   . Amitriptyline Other (See Comments)    Loopy feeling  . Ceftin [Cefuroxime Axetil] Other (See Comments)    Stomach pain   . Lovaza [Omega-3-Acid Ethyl Esters] Other (See Comments)    Stomach pain   . Lunesta [Eszopiclone] Other (See Comments)    Causes dizziness  . Lyrica [Pregabalin] Nausea Only  . Metformin And Related Nausea Only    Consultations:  General surgery   Procedures/Studies: Dg Abd 1 View  Result Date: 12/15/2018 CLINICAL DATA:  68 y/o  F; NG tube advancement. EXAM: ABDOMEN - 1 VIEW COMPARISON:  12/15/2018 abdomen radiograph FINDINGS: The enteric tube has been advanced into the distal stomach. Surgical clips project over the right upper quadrant, presumably cholecystectomy. Lumbar fusion hardware noted. He distended bowel loops in left lower quadrant again noted. Retained contrast within the renal collecting systems. IMPRESSION: Enteric tube has been advanced into the distal stomach. Electronically Signed   By: Mitzi Hansen M.D.   On: 12/15/2018 22:52   Ct Abdomen Pelvis W Contrast  Result Date: 12/15/2018 CLINICAL DATA:  Left-sided abdominal pain for several months EXAM: CT ABDOMEN AND PELVIS WITH CONTRAST TECHNIQUE: Multidetector CT imaging of the abdomen and pelvis was performed  using the standard protocol following bolus administration of intravenous contrast. CONTRAST:  OMNIPAQUE 300 COMPARISON:  01/08/2018 FINDINGS: Lower chest: No acute abnormality. Hepatobiliary: No focal liver abnormality is seen. Status post cholecystectomy. No biliary dilatation. Pancreas: Unremarkable. No pancreatic ductal dilatation or surrounding inflammatory changes. Spleen: Normal in size without focal abnormality. Adrenals/Urinary Tract: Adrenal glands are within normal limits. Kidneys demonstrate a normal enhancement pattern bilaterally. No renal calculi or obstructive changes are seen. Delayed images demonstrate normal excretion of contrast material. Bladder is well distended. Stomach/Bowel: Mild diverticular change of the colon is noted. No diverticulitis is seen. Postsurgical changes are noted in the ascending colon. The anastomosis appears patent with decompressed ileum proximal to the anastomosis. Several loops proximal to the anastomosis however there is a focal abrupt caliber change with more proximal small bowel dilatation consistent with at least partial small bowel obstruction. The appearance and location suggests underlying adhesions. The stomach is within normal limits. Vascular/Lymphatic: Aortic atherosclerosis. No enlarged abdominal or pelvic lymph nodes. Reproductive: Uterus and bilateral adnexa are unremarkable. Other: No abdominal wall hernia or abnormality. No abdominopelvic ascites. Musculoskeletal: Degenerative changes of the lumbar spine are noted. Postsurgical changes are seen from L2-L4. IMPRESSION: Postsurgical changes in the right mid abdomen with changes consistent with adhesions and at least partial distal small bowel obstruction as described. Chronic changes without acute abnormality. Electronically Signed   By: Alcide Clever M.D.   On: 12/15/2018 17:15   Dg Chest Portable 1 View  Result Date: 12/15/2018 CLINICAL DATA:  Acute chest pain. EXAM: PORTABLE CHEST 1 VIEW  COMPARISON:  05/15/2018 and prior radiographs FINDINGS: The cardiomediastinal silhouette is unremarkable. There is no evidence of focal airspace disease, pulmonary edema, suspicious pulmonary nodule/mass, pleural effusion, or pneumothorax. No acute bony abnormalities are identified. LOWER cervical spine fusion hardware again noted. IMPRESSION: No active disease. Electronically Signed   By: Harmon Pier M.D.   On: 12/15/2018 15:22   Dg Abd 2 Views  Result Date: 12/18/2018 CLINICAL DATA:  Right-sided abdominal pain. Small-bowel obstruction. EXAM: ABDOMEN - 2 VIEW  COMPARISON:  12/17/2018; 12/16/2018; CT abdomen pelvis-12/15/2018 FINDINGS: Enteric tube tip and side port projected the expected location of the mid body of the stomach. Continued slow transit ingested contrast now seen to the level of the descending colon, however there is persistent contrast opacification of distended loops distal small bowel with index loop measuring approximately 4 cm in diameter. No pneumoperitoneum, pneumatosis or portal venous gas. Limited visualization of lower thorax demonstrates minimal left basilar atelectasis. Post cholecystectomy. Post lumbar paraspinal fusion intervertebral disc space replacement, incompletely evaluated. Stigmata DISH within the lower thoracic spine. IMPRESSION: Findings most suggestive of minimally improved partial small bowel obstruction. Electronically Signed   By: Simonne Come M.D.   On: 12/18/2018 08:29   Dg Abd 2 Views  Result Date: 12/17/2018 CLINICAL DATA:  68 year old female with abdominal pain, nausea and shortness of breath since Saturday EXAM: ABDOMEN - 2 VIEW COMPARISON:  Prior chest x-ray 12/16/2018 FINDINGS: Left lateral decubitus radiograph demonstrates no evidence of free air. There are multiple air-fluid levels within both small bowel and colon. The gastric tube projects over the stomach. Surgical clips in the right upper quadrant consistent with prior cholecystectomy. Evidence of prior  extensive lumbar spine surgery including L2-L4 posterior lumbar interbody fusion with an interbody cage at L2-L3 and successful ankylosis with interbody grafts at L3-L4, L4-L5 and L5-S1. IMPRESSION: 1. Multiple air-fluid levels within both large and small bowel on the lateral decubitus view suggest persistent at least partial small bowel obstruction. 2. No evidence of free air. 3. Gastric tube is well positioned. Electronically Signed   By: [name] M.D.   On: 12/17/2018 07:51   Dg Abd Portable 1v  Result Date: 12/20/2018 CLINICAL DATA:  Abdomen pain. SBO. EXAM: PORTABLE ABDOMEN - 1 VIEW COMPARISON:  12/19/2018. FINDINGS: There has been some partial clearing of enteric contrast from the colon, presumed evacuation. There are no dilated small bowel loops, and the colon is less distended with fecal material and contrast than was observed on yesterday's radiograph. Previous lumbar instrumentation appears unchanged. Cholecystectomy clips. Nasogastric tube is seen in the stomach. No acute osseous findings. No visible free air on this portable supine radiograph. IMPRESSION: No evidence for small bowel obstruction. Electronically Signed   By: Elsie Stain M.D.   On: 12/20/2018 09:11   Dg Abd Portable 1v  Result Date: 12/19/2018 CLINICAL DATA:  68 year old female with small bowel obstruction on recent CT. Prior bowel surgery. EXAM: PORTABLE ABDOMEN - 1 VIEW COMPARISON:  12/18/2018 and earlier, CT Abdomen and Pelvis 12/15/2018. FINDINGS: Portable AP supine view at 0625 hours. Stable enteric tube, side hole the level of the gastric body. Stable cholecystectomy clips. Previous lumbar spine surgery. Small volume retained oral contrast mixed with stool in the colon. There are no dilated loops today. Stable visualized osseous structures. IMPRESSION: 1. Normalized gas pattern, resolved small bowel obstruction. 2. Stable enteric tube. Electronically Signed   By: Odessa Fleming M.D.   On: 12/19/2018 08:23   Dg Abd  Portable 1v-small Bowel Obstruction Protocol-initial, 8 Hr Delay  Result Date: 12/16/2018 CLINICAL DATA:  Lower abdominal pain. EXAM: PORTABLE ABDOMEN - 1 VIEW COMPARISON:  Radiograph of Dec 15, 2018. FINDINGS: Mildly dilated small bowel loops are noted centrally concerning for possible small bowel obstruction. Distal tip of nasogastric tube is seen in expected position of distal stomach. Status post cholecystectomy. IMPRESSION: Mildly dilated small bowel loops are noted concerning for possible distal small bowel obstruction. Nasogastric tube tip seen in expected position of distal stomach. Electronically Signed  By: Lupita Raider M.D.   On: 12/16/2018 08:11   Dg Abd Portable 1v  Result Date: 12/15/2018 CLINICAL DATA:  68 year old female status post NG tube placement. EXAM: PORTABLE ABDOMEN - 1 VIEW COMPARISON:  Abdominal radiograph dated 12/15/2018 and CT dated 12/15/2018 FINDINGS: There has been interval placement of an enteric tube with side-port approximately 3.5 cm distal to the GE junction and tip in the proximal stomach. Recommend further advancing of the tube into the stomach by approximately additional 5 cm for optimal positioning. Mildly distended loops of small bowel measuring 3.5 cm in the left lower quadrant noted. Excreted contrast noted in the renal collecting systems. There is degenerative changes of the spine with fixation hardware. IMPRESSION: Interval placement of an enteric tube with tip in the proximal stomach. Electronically Signed   By: Elgie Collard M.D.   On: 12/15/2018 21:31   Dg Abd Portable 1 View  Result Date: 12/15/2018 CLINICAL DATA:  NG tube placement EXAM: PORTABLE ABDOMEN - 1 VIEW COMPARISON:  None. FINDINGS: An NG tube is identified with tip overlying the proximal stomach and side hole overlying the distal esophagus. Bowel gas pattern is unremarkable. IMPRESSION: NG tube with tip overlying the proximal stomach and side hole overlying the distal esophagus. Recommend  advancement. Electronically Signed   By: Harmon Pier M.D.   On: 12/15/2018 18:27      Subjective: Patient seen and examined at bedside, resting comfortably.  Tolerating diet no further nausea or vomiting.  Having bowel movements.  Ready for discharge home.  No other concerns/complaints at this time.  Denies headache, no fever/chills/night sweats, no nausea/vomiting/diarrhea, no chest pain, no palpitations, no abdominal pain, no cough/congestion, no paresthesias.  No acute events overnight per nursing staff.   Discharge Exam: Vitals:   12/22/18 0427 12/22/18 0856  BP: (!) 176/74 (!) 187/94  Pulse: 74   Resp: 15 18  Temp: 98.2 F (36.8 C) 98 F (36.7 C)  SpO2: 100% 97%   Vitals:   12/21/18 1632 12/21/18 2011 12/22/18 0427 12/22/18 0856  BP: (!) 178/76 (!) 189/79 (!) 176/74 (!) 187/94  Pulse: 79 75 74   Resp: 18 19 15 18   Temp: 98.3 F (36.8 C) (!) 97.5 F (36.4 C) 98.2 F (36.8 C) 98 F (36.7 C)  TempSrc: Oral Oral Oral Oral  SpO2: 99% 100% 100% 97%  Weight:      Height:        General: Pt is alert, awake, not in acute distress Cardiovascular: RRR, S1/S2 +, no rubs, no gallops Respiratory: CTA bilaterally, no wheezing, no rhonchi Abdominal: Soft, NT, ND, bowel sounds + Extremities: no edema, no cyanosis    The results of significant diagnostics from this hospitalization (including imaging, microbiology, ancillary and laboratory) are listed below for reference.     Microbiology: Recent Results (from the past 240 hour(s))  SARS Coronavirus 2 (CEPHEID - Performed in [facility] hospital lab), Hosp Order     Status: None   Collection Time: 12/15/18  3:21 PM  Result Value Ref Range Status   SARS Coronavirus 2 NEGATIVE NEGATIVE Final    Comment: (NOTE) If result is NEGATIVE SARS-CoV-2 target nucleic acids are NOT DETECTED. The SARS-CoV-2 RNA is generally detectable in upper and lower  respiratory specimens during the acute phase of infection. The lowest  concentration  of SARS-CoV-2 viral copies this assay can detect is 250  copies / mL. A negative result does not preclude SARS-CoV-2 infection  and should not be used as  the sole basis for treatment or other  patient management decisions.  A negative result may occur with  improper specimen collection / handling, submission of specimen other  than nasopharyngeal swab, presence of viral mutation(s) within the  areas targeted by this assay, and inadequate number of viral copies  (<250 copies / mL). A negative result must be combined with clinical  observations, patient history, and epidemiological information. If result is POSITIVE SARS-CoV-2 target nucleic acids are DETECTED. The SARS-CoV-2 RNA is generally detectable in upper and lower  respiratory specimens dur ing the acute phase of infection.  Positive  results are indicative of active infection with SARS-CoV-2.  Clinical  correlation with patient history and other diagnostic information is  necessary to determine patient infection status.  Positive results do  not rule out bacterial infection or co-infection with other viruses. If result is PRESUMPTIVE POSTIVE SARS-CoV-2 nucleic acids MAY BE PRESENT.   A presumptive positive result was obtained on the submitted specimen  and confirmed on repeat testing.  While 2019 novel coronavirus  (SARS-CoV-2) nucleic acids may be present in the submitted sample  additional confirmatory testing may be necessary for epidemiological  and / or clinical management purposes  to differentiate between  SARS-CoV-2 and other Sarbecovirus currently known to infect humans.  If clinically indicated additional testing with an alternate test  methodology [phone]) is advised. The SARS-CoV-2 RNA is generally  detectable in upper and lower respiratory sp ecimens during the acute  phase of infection. The expected result is Negative. Fact Sheet for Patients:  BoilerBrush.com.cy Fact Sheet for Healthcare  Providers: https://pope.com/ This test is not yet approved or cleared by the [place] FDA and has been authorized for detection and/or diagnosis of SARS-CoV-2 by FDA under an Emergency Use Authorization (EUA).  This EUA will remain in effect (meaning this test can be used) for the duration of the COVID-19 declaration under Section 564(b)(1) of the Act, 21 U.S.C. section 360bbb-3(b)(1), unless the authorization is terminated or revoked sooner. Performed at [facility], [address], [place], [place] [address]      Labs: BNP (last 3 results) No results for input(s): BNP in the last 8760 hours. Basic Metabolic Panel: Recent Labs  Lab 12/16/18 0637 12/17/18 0625 12/18/18 0318 12/20/18 0352 12/21/18 0328 12/22/18 0438  NA 140 141 142 139 135 139  K 4.2 4.1 3.5 2.7* 3.1* 3.0*  CL 103 105 113* 105 110 110  CO2 24 24 21* 20* 13* 20*  GLUCOSE 145* 200* 157* 98 128* 134*  BUN 14 15 15  7* 6* <5*  CREATININE 0.91 1.00 0.98 0.94 0.99 0.81  CALCIUM 9.1 8.9 8.4* 8.0* 7.9* 8.6*  MG 1.9 1.9 1.6*  --  1.6*  --   PHOS 4.5 3.5 3.1  --   --   --    Liver Function Tests: Recent Labs  Lab 12/15/18 1521 12/16/18 0637  AST 15 12*  ALT 14 12  ALKPHOS 122 114  BILITOT 1.1 0.8  PROT 7.1 6.5  ALBUMIN 3.9 3.6   Recent Labs  Lab 12/15/18 1521  LIPASE 22   No results for input(s): AMMONIA in the last 168 hours. CBC: Recent Labs  Lab 12/15/18 1521 12/16/18 0637 12/17/18 0625 12/18/18 0318  WBC 13.3* 11.4* 8.7 7.4  NEUTROABS 11.3*  --   --   --   HGB 16.2* 14.9 14.5 13.8  HCT 48.2* 44.2 43.9 42.6  MCV 81.7 83.4 84.9 85.5  PLT 346 320 292 288  Cardiac Enzymes: Recent Labs  Lab 12/15/18 1521  TROPONINI <0.03   BNP: Invalid input(s): POCBNP CBG: Recent Labs  Lab 12/21/18 1140 12/21/18 1559 12/21/18 2012 12/21/18 2221 12/22/18 0656  GLUCAP 239* 358* 317* 262* 108*   D-Dimer No results for input(s): DDIMER in the last 72  hours. Hgb A1c Recent Labs    12/21/18 0328  HGBA1C 9.3*   Lipid Profile No results for input(s): CHOL, HDL, LDLCALC, TRIG, CHOLHDL, LDLDIRECT in the last 72 hours. Thyroid function studies No results for input(s): TSH, T4TOTAL, T3FREE, THYROIDAB in the last 72 hours.  Invalid input(s): FREET3 Anemia work up No results for input(s): VITAMINB12, FOLATE, FERRITIN, TIBC, IRON, RETICCTPCT in the last 72 hours. Urinalysis    Component Value Date/Time   COLORURINE YELLOW 12/15/2018 1716   APPEARANCEUR CLEAR 12/15/2018 1716   LABSPEC 1.040 (H) 12/15/2018 1716   PHURINE 5.0 12/15/2018 1716   GLUCOSEU >=500 (A) 12/15/2018 1716   HGBUR SMALL (A) 12/15/2018 1716   BILIRUBINUR NEGATIVE 12/15/2018 1716   KETONESUR 20 (A) 12/15/2018 1716   PROTEINUR NEGATIVE 12/15/2018 1716   UROBILINOGEN 0.2 07/30/2012 2228   NITRITE NEGATIVE 12/15/2018 1716   LEUKOCYTESUR NEGATIVE 12/15/2018 1716   Sepsis Labs Invalid input(s): PROCALCITONIN,  WBC,  LACTICIDVEN Microbiology Recent Results (from the past 240 hour(s))  SARS Coronavirus 2 (CEPHEID - Performed in [facility] hospital lab), Hosp Order     Status: None   Collection Time: 12/15/18  3:21 PM  Result Value Ref Range Status   SARS Coronavirus 2 NEGATIVE NEGATIVE Final    Comment: (NOTE) If result is NEGATIVE SARS-CoV-2 target nucleic acids are NOT DETECTED. The SARS-CoV-2 RNA is generally detectable in upper and lower  respiratory specimens during the acute phase of infection. The lowest  concentration of SARS-CoV-2 viral copies this assay can detect is 250  copies / mL. A negative result does not preclude SARS-CoV-2 infection  and should not be used as the sole basis for treatment or other  patient management decisions.  A negative result may occur with  improper specimen collection / handling, submission of specimen other  than nasopharyngeal swab, presence of viral mutation(s) within the  areas targeted by this assay, and inadequate  number of viral copies  (<250 copies / mL). A negative result must be combined with clinical  observations, patient history, and epidemiological information. If result is POSITIVE SARS-CoV-2 target nucleic acids are DETECTED. The SARS-CoV-2 RNA is generally detectable in upper and lower  respiratory specimens dur ing the acute phase of infection.  Positive  results are indicative of active infection with SARS-CoV-2.  Clinical  correlation with patient history and other diagnostic information is  necessary to determine patient infection status.  Positive results do  not rule out bacterial infection or co-infection with other viruses. If result is PRESUMPTIVE POSTIVE SARS-CoV-2 nucleic acids MAY BE PRESENT.   A presumptive positive result was obtained on the submitted specimen  and confirmed on repeat testing.  While 2019 novel coronavirus  (SARS-CoV-2) nucleic acids may be present in the submitted sample  additional confirmatory testing may be necessary for epidemiological  and / or clinical management purposes  to differentiate between  SARS-CoV-2 and other Sarbecovirus currently known to infect humans.  If clinically indicated additional testing with an alternate test  methodology [phone]) is advised. The SARS-CoV-2 RNA is generally  detectable in upper and lower respiratory sp ecimens during the acute  phase of infection. The expected result is Negative. Fact Sheet for Patients:  BoilerBrush.com.cy Fact Sheet for Healthcare Providers: https://pope.com/ This test is not yet approved or cleared by the [place] FDA and has been authorized for detection and/or diagnosis of SARS-CoV-2 by FDA under an Emergency Use Authorization (EUA).  This EUA will remain in effect (meaning this test can be used) for the duration of the COVID-19 declaration under Section 564(b)(1) of the Act, 21 U.S.C. section 360bbb-3(b)(1), unless the  authorization is terminated or revoked sooner. Performed at [facility], [address] [place], [place] [address]      Time coordinating discharge: Over 30 minutes  SIGNED:   Alvira Philips [place], DO  [facility] 12/22/2018, 10:28 AM

## 2019-01-03 DIAGNOSIS — K219 Gastro-esophageal reflux disease without esophagitis: Secondary | ICD-10-CM | POA: Diagnosis not present

## 2019-01-03 DIAGNOSIS — E785 Hyperlipidemia, unspecified: Secondary | ICD-10-CM | POA: Diagnosis not present

## 2019-01-03 DIAGNOSIS — M810 Age-related osteoporosis without current pathological fracture: Secondary | ICD-10-CM | POA: Diagnosis not present

## 2019-01-03 DIAGNOSIS — K56609 Unspecified intestinal obstruction, unspecified as to partial versus complete obstruction: Secondary | ICD-10-CM | POA: Diagnosis not present

## 2019-01-17 DIAGNOSIS — R112 Nausea with vomiting, unspecified: Secondary | ICD-10-CM | POA: Diagnosis not present

## 2019-01-17 DIAGNOSIS — Z86010 Personal history of colonic polyps: Secondary | ICD-10-CM | POA: Diagnosis not present

## 2019-01-17 DIAGNOSIS — K56600 Partial intestinal obstruction, unspecified as to cause: Secondary | ICD-10-CM | POA: Diagnosis not present

## 2019-01-22 DIAGNOSIS — R112 Nausea with vomiting, unspecified: Secondary | ICD-10-CM | POA: Diagnosis not present

## 2019-01-22 DIAGNOSIS — K219 Gastro-esophageal reflux disease without esophagitis: Secondary | ICD-10-CM | POA: Diagnosis not present

## 2019-01-22 DIAGNOSIS — K449 Diaphragmatic hernia without obstruction or gangrene: Secondary | ICD-10-CM | POA: Diagnosis not present

## 2019-02-05 DIAGNOSIS — M542 Cervicalgia: Secondary | ICD-10-CM | POA: Diagnosis not present

## 2019-02-05 DIAGNOSIS — M545 Low back pain: Secondary | ICD-10-CM | POA: Diagnosis not present

## 2019-02-19 ENCOUNTER — Other Ambulatory Visit: Payer: Self-pay | Admitting: Student

## 2019-02-19 DIAGNOSIS — M542 Cervicalgia: Secondary | ICD-10-CM

## 2019-02-25 ENCOUNTER — Encounter (HOSPITAL_COMMUNITY): Payer: Self-pay | Admitting: *Deleted

## 2019-02-25 ENCOUNTER — Other Ambulatory Visit: Payer: Self-pay

## 2019-02-25 ENCOUNTER — Emergency Department (HOSPITAL_COMMUNITY)
Admission: EM | Admit: 2019-02-25 | Discharge: 2019-02-26 | Disposition: A | Discharge status: Home / Self Care | Payer: Medicare Other | Attending: Emergency Medicine | Admitting: Emergency Medicine

## 2019-02-25 ENCOUNTER — Emergency Department (HOSPITAL_COMMUNITY): Payer: Medicare Other

## 2019-02-25 DIAGNOSIS — Z794 Long term (current) use of insulin: Secondary | ICD-10-CM | POA: Diagnosis not present

## 2019-02-25 DIAGNOSIS — Z7982 Long term (current) use of aspirin: Secondary | ICD-10-CM | POA: Insufficient documentation

## 2019-02-25 DIAGNOSIS — R0789 Other chest pain: Secondary | ICD-10-CM | POA: Diagnosis not present

## 2019-02-25 DIAGNOSIS — Z79899 Other long term (current) drug therapy: Secondary | ICD-10-CM | POA: Diagnosis not present

## 2019-02-25 DIAGNOSIS — Z86718 Personal history of other venous thrombosis and embolism: Secondary | ICD-10-CM | POA: Insufficient documentation

## 2019-02-25 DIAGNOSIS — E119 Type 2 diabetes mellitus without complications: Secondary | ICD-10-CM | POA: Diagnosis not present

## 2019-02-25 DIAGNOSIS — R079 Chest pain, unspecified: Secondary | ICD-10-CM | POA: Diagnosis not present

## 2019-02-25 DIAGNOSIS — R51 Headache: Secondary | ICD-10-CM | POA: Diagnosis not present

## 2019-02-25 DIAGNOSIS — Z7901 Long term (current) use of anticoagulants: Secondary | ICD-10-CM | POA: Diagnosis not present

## 2019-02-25 DIAGNOSIS — I10 Essential (primary) hypertension: Secondary | ICD-10-CM | POA: Diagnosis not present

## 2019-02-25 DIAGNOSIS — E039 Hypothyroidism, unspecified: Secondary | ICD-10-CM | POA: Diagnosis not present

## 2019-02-25 LAB — CBC
HCT: 41.8 % (ref 36.0–46.0)
Hemoglobin: 13.9 g/dL (ref 12.0–15.0)
MCH: 28 pg (ref 26.0–34.0)
MCHC: 33.3 g/dL (ref 30.0–36.0)
MCV: 84.1 fL (ref 80.0–100.0)
Platelets: 265 10*3/uL (ref 150–400)
RBC: 4.97 MIL/uL (ref 3.87–5.11)
RDW: 13.1 % (ref 11.5–15.5)
WBC: 7.7 10*3/uL (ref 4.0–10.5)
nRBC: 0 % (ref 0.0–0.2)

## 2019-02-25 LAB — TROPONIN I (HIGH SENSITIVITY): Troponin I (High Sensitivity): 6 ng/L (ref <18)

## 2019-02-25 LAB — BASIC METABOLIC PANEL
Anion gap: 8 (ref 5–15)
BUN: 8 mg/dL (ref 8–23)
CO2: 26 mmol/L (ref 22–32)
Calcium: 9.3 mg/dL (ref 8.9–10.3)
Chloride: 104 mmol/L (ref 98–111)
Creatinine, Ser: 0.97 mg/dL (ref 0.44–1.00)
GFR calc Af Amer: >60 mL/min (ref >60)
GFR calc non Af Amer: >60 mL/min (ref >60)
Glucose, Bld: 158 mg/dL — ABNORMAL HIGH (ref 70–99)
Potassium: 3.7 mmol/L (ref 3.5–5.1)
Sodium: 138 mmol/L (ref 135–145)

## 2019-02-25 MED ORDER — SODIUM CHLORIDE 0.9% FLUSH
3.0000 mL | Freq: Once | INTRAVENOUS | Status: AC
  Administered 2019-02-26: 3 mL via INTRAVENOUS

## 2019-02-25 NOTE — ED Triage Notes (Signed)
198/100 before her medication, took her BP meds, and then came down to 180/100, took additional medication, then it was 150/98. She reports headache and right sided chest pain.

## 2019-02-26 ENCOUNTER — Emergency Department (HOSPITAL_COMMUNITY): Payer: Medicare Other

## 2019-02-26 DIAGNOSIS — I10 Essential (primary) hypertension: Secondary | ICD-10-CM | POA: Diagnosis not present

## 2019-02-26 DIAGNOSIS — R51 Headache: Secondary | ICD-10-CM | POA: Diagnosis not present

## 2019-02-26 LAB — TROPONIN I (HIGH SENSITIVITY): Troponin I (High Sensitivity): 7 ng/L (ref <18)

## 2019-02-26 MED ORDER — HYDRALAZINE HCL 20 MG/ML IJ SOLN
5.0000 mg | Freq: Once | INTRAMUSCULAR | Status: AC
  Administered 2019-02-26: 5 mg via INTRAVENOUS
  Filled 2019-02-26: qty 1

## 2019-02-26 MED ORDER — CLONIDINE HCL 0.1 MG PO TABS
0.1000 mg | ORAL_TABLET | Freq: Once | ORAL | Status: AC
  Administered 2019-02-26: 0.1 mg via ORAL
  Filled 2019-02-26: qty 1

## 2019-02-26 MED ORDER — HYDRALAZINE HCL 20 MG/ML IJ SOLN
10.0000 mg | Freq: Once | INTRAMUSCULAR | Status: AC
  Administered 2019-02-26: 10 mg via INTRAVENOUS
  Filled 2019-02-26: qty 1

## 2019-02-26 NOTE — Discharge Instructions (Signed)
Take your blood pressure medications as prescribed and keep a record of your blood pressure for your doctor to evaluate whether you need additional medications.  Return to the ED with chest pain, shortness of breath, unilateral weakness, numbness, tingling, difficulty speaking, difficulty swallowing or other concerns.  Avoid taking the Mucinex as it can be elevating her blood pressure.

## 2019-02-26 NOTE — ED Provider Notes (Signed)
[facility] EMERGENCY DEPARTMENT Provider Note   CSN: [id number] Arrival date & time: 02/25/19  2220     History   Chief Complaint Chief Complaint  Patient presents with  . Hypertension    HPI [name] is a 68 y.o. female.     Patient with history of hypertension, diabetes, DVT on Xarelto, fibromyalgia, aortic insufficiency presenting with elevated blood pressure.  States she woke up yesterday morning with a headache at the top of her head that has progressively worsened.  Her blood pressure was 180s over 90s throughout the day yesterday.  She took her blood pressure medication in the morning as she usual does and it remained elevated all day. the blood pressure came down to 180/100.  It was 198/100 before she took her medications.  States she took her irbesartan and amlodipine as scheduled.  There is been no recent missed medications or change in medications.  She denies any focal weakness, numbness or tingling.  No Difficulty speaking or difficulty swallowing.  Contrary to triage note there is no chest pain but she has chronic pain in her neck.  No new weakness or numbness.  Denies any vision changes.  She reports taking Tylenol for the headache with minimal relief.  Denies any dizziness or lightheadedness or spinning sensation.  Patient does admit to taking Mucinex which may be contributing her hypertension.  The history is provided by the patient.    Past Medical History:  Diagnosis Date  . Anemia    iron deficiency  . Arthritis   . Blood transfusion   . Cervical spondylosis with myelopathy and radiculopathy   . Depression   . Diabetes mellitus    Type II  . Diastolic dysfunction   . Diverticulitis   . DJD (degenerative joint disease)   . DVT (deep venous thrombosis) (HCC)    times 2 lower leg  . Endometriosis   . Family history of adverse reaction to anesthesia    mom and dad PONV  . Fibromyalgia   . GERD (gastroesophageal reflux disease)    occ  . Hiatal hernia   . History of kidney stones   . Hyperlipidemia   . Hypertension   . Hypothyroidism   . Insomnia   . Mild aortic insufficiency    by echo 04/2013  . Osteoarthritis    back and knee  . Ovarian cyst   . PONV (postoperative nausea and vomiting)   . RLS (restless legs syndrome)   . Thoracic compression fracture ([place])   . Uterine fibroid   . UTI (lower urinary tract infection)   . Villous adenoma of right colon 04/08/2016  . Vitamin D deficiency disease     Patient Active Problem List   Diagnosis Date Noted  . Diabetic hyperosmolar non-ketotic state ([place]) 05/15/2018  . Hyperlipidemia 05/15/2018  . GERD (gastroesophageal reflux disease) 05/15/2018  . Depression with anxiety 05/15/2018  . Cerebral thrombosis with cerebral infarction 11/22/2017  . Acute encephalopathy   . DKA (diabetic ketoacidoses) ([place]) 11/18/2017  . Diabetic ketoacidosis ([place]) 11/18/2017  . Acute respiratory failure with hypoxia ([place])   . Acute renal failure ([place])   . Endotracheal tube present   . Septic shock ([place])   . Spondylolisthesis of lumbar region 11/15/2017  . PAC (premature atrial contraction) 09/04/2017  . Cervical spondylosis with radiculopathy 03/23/2017  . Hypoxia   . Villous adenoma of right colon 04/08/2016  . Leukocytosis 04/08/2016  . Anemia   . Diastolic dysfunction   .  Mild aortic insufficiency   . Other nonspecific abnormal cardiovascular system function study 05/03/2013  . SOB (shortness of breath) 05/01/2013  . Atypical chest pain 02/28/2013  . Dysphagia 02/28/2013  . UTI (lower urinary tract infection) 07/06/2012  . Intractable nausea and vomiting 07/06/2012  . Blood loss anemia 07/06/2012  . Epigastric abdominal pain 06/27/2012  . Type II diabetes mellitus ([place]) 07/28/2011  . Hypothyroidism 07/28/2011  . Hypertension 07/28/2011  . Hepatic steatosis 07/28/2011    Past Surgical History:  Procedure Laterality Date  . ANTERIOR CERVICAL DECOMP/DISCECTOMY  FUSION N/A 03/23/2017   Procedure: ANTERIOR CERVICAL DECOMPRESSION/DISCECTOMY FUSION, INTERBODY PROSTHESIS,PLATE CERVICAL THREE- CERVICAL FOUR, CERVICAL FOUR- CERVICAL FIVE, CERVICAL FIVE- CERVICAL SIX;  Surgeon: Newman Pies, MD;  Location: [facility];  Service: Neurosurgery;  Laterality: N/A;  . APPENDECTOMY    . BACK SURGERY  ,2005   April  2013 - spinal fusion@ [facility]  . BREAST SURGERY     breast reduction  . CARDIAC CATHETERIZATION  04/2013   normal coronary arteries and normal LVF  . CARPAL TUNNEL RELEASE  06/05/2012   Procedure: CARPAL TUNNEL RELEASE;  Surgeon: Cammie Sickle., MD;  Location: [facility];  Service: Orthopedics;  Laterality: Left;  . CESAREAN SECTION     x 2  . CHOLECYSTECTOMY    . COLECTOMY  04/08/2016  . DILATION AND CURETTAGE OF UTERUS    . DORSAL COMPARTMENT RELEASE  06/05/2012   Procedure: RELEASE DORSAL COMPARTMENT (DEQUERVAIN);  Surgeon: Cammie Sickle., MD;  Location[facility];  Service: Orthopedics;  Laterality: Left;  Excision of mixoid cyst also  . EYE SURGERY     cataracts bilateral  . JOINT REPLACEMENT     Bilateral knee  . KNEE ARTHROPLASTY  09   lft partial  . KNEE ARTHROPLASTY     rt  . LAPAROSCOPIC PARTIAL COLECTOMY N/A 04/08/2016   Procedure: LAPAROSCOPIC ASSISTED ASCENDING COLECTOMY POSSIBLE OPEN COLECTOMY;  Surgeon: Fanny Skates, MD;  Location: [facility];  Service: General;  Laterality: N/A;  . orthopedic surgeries     multiple  . REDUCTION MAMMAPLASTY Bilateral   . SHOULDER OPEN ROTATOR CUFF REPAIR     rt and lft  . TEE WITHOUT CARDIOVERSION N/A 11/23/2017   Procedure: TRANSESOPHAGEAL ECHOCARDIOGRAM (TEE);  Surgeon: Jerline Pain, MD;  Location: [facility] ENDOSCOPY;  Service: Cardiovascular;  Laterality: N/A;  . TUBAL LIGATION     btsp     OB History    Gravida  2   Para  2   Term  2   Preterm      AB      Living  2     SAB      TAB      Ectopic      Multiple      Live Births                Home Medications    Prior to Admission medications   Medication Sig Start Date End Date Taking? Authorizing Provider  acetaminophen (TYLENOL) 500 MG tablet Take 1,000 mg by mouth every 8 (eight) hours as needed for mild pain or headache.    [provider]  amLODipine (NORVASC) 2.5 MG tablet Take 2.5 mg by mouth daily. 10/27/18   [provider]  aspirin EC 81 MG tablet Take 81 mg by mouth daily.    [provider]  clobetasol (TEMOVATE) 0.05 % external solution Apply 1 application topically See admin instructions. Apply twice  daily to scalp and ear as needed for itching/rash (do not apply to face/groin/underarms) 07/13/16   [provider]  Cyanocobalamin (B-12) 2500 MCG TABS Take 2,500 mcg by mouth daily.    [provider]  cyclobenzaprine (FLEXERIL) 10 MG tablet Take 1 tablet (10 mg total) by mouth 3 (three) times daily as needed for muscle spasms. 03/24/17   Costella, Vista Mink, PA-C  dexlansoprazole (DEXILANT) 60 MG capsule Take 60 mg by mouth daily.    [provider]  docusate sodium (COLACE) 100 MG capsule Take 1 capsule (100 mg total) by mouth 2 (two) times daily. 11/17/17   Newman Pies, MD  DULoxetine (CYMBALTA) 60 MG capsule Take 60 mg by mouth daily.     [provider]  ferrous sulfate 325 (65 FE) MG tablet Take 1 tablet (325 mg total) by mouth 2 (two) times daily with a meal. 11/17/17   Newman Pies, MD  fluticasone [facility]) 50 MCG/ACT nasal spray Place 2 sprays into both nostrils daily as needed for allergies or rhinitis.    [provider]  furosemide (LASIX) 20 MG tablet Take 20 mg by mouth daily as needed for fluid or edema.     [provider]  gabapentin (NEURONTIN) 300 MG capsule Take 600 mg by mouth at bedtime.  03/30/18   [provider]  ibuprofen (ADVIL) 200 MG tablet Take 400 mg by mouth every 6 (six) hours as needed for headache (pain).    [provider]   insulin degludec (TRESIBA FLEXTOUCH) 100 UNIT/ML SOPN FlexTouch Pen Inject 40 Units into the skin daily.    [provider]  insulin lispro (HUMALOG KWIKPEN) 100 UNIT/ML KwikPen Inject 14 Units into the skin 3 (three) times daily.    [provider]  irbesartan (AVAPRO) 75 MG tablet Take 75 mg by mouth daily. 11/15/18   [provider]  levothyroxine (SYNTHROID, LEVOTHROID) 150 MCG tablet Take 150 mcg by mouth daily before breakfast. 10/10/17   [provider]  LORazepam (ATIVAN) 0.5 MG tablet Take 1 tablet (0.5 mg total) by mouth every 8 (eight) hours as needed for anxiety. 05/17/18 05/17/19  Manuella Ghazi, Pratik D, DO  meclizine (ANTIVERT) 12.5 MG tablet Take 25 mg by mouth 3 (three) times daily as needed for dizziness.    [provider]  oxyCODONE (OXY IR/ROXICODONE) 5 MG immediate release tablet Take 1 tablet (5 mg total) by mouth every 4 (four) hours as needed for moderate pain ((score 4 to 6)). Patient not taking: Reported on 12/15/2018 11/17/17   Newman Pies, MD  oxyCODONE-acetaminophen (PERCOCET/ROXICET) 5-325 MG tablet Take 1 tablet by mouth every 6 (six) hours as needed (pain).  10/26/18   [provider]  Pitavastatin Calcium (LIVALO) 2 MG TABS Take 2 mg by mouth every Wednesday.    [provider]  Probiotic Product (PROBIOTIC PO) Take 1 capsule by mouth daily.    [provider]  promethazine (PHENERGAN) 25 MG tablet Take 25 mg by mouth every 8 (eight) hours as needed for nausea or vomiting.    [provider]  rivaroxaban (XARELTO) 20 MG TABS tablet Take 20 mg by mouth daily.    [provider]  temazepam (RESTORIL) 15 MG capsule Take 15 mg by mouth at bedtime.  10/10/17   [provider]    Family History Family History  Problem Relation Age of Onset  . Heart attack Father        64s  . Hypothyroidism Father   .  Diabetes type II Father   . Diabetes type II Mother   . Hypertension Mother   .  Breast cancer Mother 54  . Hypothyroidism Brother   . Breast cancer Maternal Aunt   . Breast cancer Cousin   . Breast cancer Maternal Aunt   . Breast cancer Cousin     Social History Social History   Tobacco Use  . Smoking status: Never Smoker  . Smokeless tobacco: Never Used  Substance Use Topics  . Alcohol use: No  . Drug use: No     Allergies   Codeine, Ambien [zolpidem tartrate], Crestor [rosuvastatin], Lipitor [atorvastatin], Morphine and related, Penicillins, Statins, Dilaudid [hydromorphone hcl], Heparin, Amitriptyline, Ceftin [cefuroxime axetil], Lovaza [omega-3-acid ethyl esters], Lunesta [eszopiclone], Lyrica [pregabalin], and Metformin and related   Review of Systems Review of Systems   Physical Exam Updated Vital Signs BP (!) 215/90 (BP Location: Right Arm)   Pulse 86   Temp 98.3 F (36.8 C)   Resp 16   SpO2 100%   Physical Exam Vitals signs and nursing note reviewed.  Constitutional:      General: She is not in acute distress.    Appearance: She is well-developed.  HENT:     Head: Normocephalic and atraumatic.     Mouth/Throat:     Pharynx: No oropharyngeal exudate.  Eyes:     Conjunctiva/sclera: Conjunctivae normal.     Pupils: Pupils are equal, round, and reactive to light.  Neck:     Musculoskeletal: Normal range of motion and neck supple.     Comments: No meningismus. Cardiovascular:     Rate and Rhythm: Normal rate and regular rhythm.     Heart sounds: Normal heart sounds. No murmur.  Pulmonary:     Effort: Pulmonary effort is normal. No respiratory distress.     Breath sounds: Normal breath sounds.  Abdominal:     Palpations: Abdomen is soft.     Tenderness: There is no abdominal tenderness. There is no guarding or rebound.  Musculoskeletal: Normal range of motion.        General: No tenderness.  Skin:    General: Skin is warm.     Capillary Refill: Capillary refill takes less than 2 seconds.  Neurological:     General: No focal  deficit present.     Mental Status: She is alert and oriented to person, place, and time. Mental status is at baseline.     Cranial Nerves: No cranial nerve deficit.     Motor: No abnormal muscle tone.     Coordination: Coordination normal.     Comments: CN 2-12 intact, no ataxia on finger to nose, no nystagmus, 5/5 strength throughout, no pronator drift, Romberg negative, normal gait.    Psychiatric:        Behavior: Behavior normal.      ED Treatments / Results  Labs (all labs ordered are listed, but only abnormal results are displayed) Labs Reviewed  BASIC METABOLIC PANEL - Abnormal; Notable for the following components:      Result Value   Glucose, Bld 158 (*)    All other components within normal limits  CBC  TROPONIN I (HIGH SENSITIVITY)  TROPONIN I (HIGH SENSITIVITY)    EKG EKG Interpretation  Date/Time:  Monday February 25 2019 22:53:38 EDT Ventricular Rate:  85 PR Interval:  186 QRS Duration: 86 QT Interval:  396 QTC Calculation: 471 R Axis:   -38 Text Interpretation:  Normal sinus rhythm Left axis deviation Minimal voltage criteria for  LVH, may be normal variant Septal infarct , age undetermined Abnormal ECG No significant change since last tracing other than rate is slower Confirmed by Pryor Curia [phone]) on 02/25/2019 10:57:47 PM   Radiology Dg Chest 2 View  Result Date: 02/25/2019 CLINICAL DATA:  Chest pain EXAM: CHEST - 2 VIEW COMPARISON:  12/15/2018 FINDINGS: Cardiac shadow is stable. Mild aortic calcifications are seen. The lungs are well aerated without focal infiltrate or sizable effusion. Postsurgical changes in the lumbar and cervical spines are seen. Degenerative change of the thoracic spine is noted. IMPRESSION: No active cardiopulmonary disease. Electronically Signed   By: Inez Catalina M.D.   On: 02/25/2019 23:29    Procedures Procedures (including critical care time)  Medications Ordered in ED Medications  sodium chloride flush (NS) 0.9 %  injection 3 mL (has no administration in time range)     Initial Impression / Assessment and Plan / ED Course  I have reviewed the triage vital signs and the nursing notes.  Pertinent labs & imaging results that were available during my care of the patient were reviewed by me and considered in my medical decision making (see chart for details).       Hypertension with gradual onset headache. No neuro deficits. EKG unchanged.   With xarelto use and headache, CT head obtained which is negative.   Patient given dose of hydralazine in the ED. SUspect her BP elevation may be due to mucinex use.  No missed doses or recent changes.   Labs reassuring without evidence of end organ damage. Troponin negative x2. Headache improved.  BP improved to 150-160s in the ED and patient feeling better and tolerating PO. Advised to keep log of BP at home and followup with PCP for potential medication adjustments. Avoid mucinex use.  Return precautions discussed.   Final Clinical Impressions(s) / ED Diagnoses   Final diagnoses:  Hypertension, unspecified type    ED Discharge Orders    None       Shyra Emile, Annie Main, MD 02/26/19 [id number]

## 2019-03-14 DIAGNOSIS — K219 Gastro-esophageal reflux disease without esophagitis: Secondary | ICD-10-CM | POA: Diagnosis not present

## 2019-03-14 DIAGNOSIS — K56600 Partial intestinal obstruction, unspecified as to cause: Secondary | ICD-10-CM | POA: Diagnosis not present

## 2019-03-19 ENCOUNTER — Other Ambulatory Visit: Payer: Self-pay

## 2019-03-19 ENCOUNTER — Ambulatory Visit
Admission: RE | Admit: 2019-03-19 | Discharge: 2019-03-19 | Disposition: A | Discharge status: Home / Self Care | Payer: Medicare Other | Source: Ambulatory Visit | Attending: Student | Admitting: Student

## 2019-03-19 DIAGNOSIS — M50223 Other cervical disc displacement at C6-C7 level: Secondary | ICD-10-CM | POA: Diagnosis not present

## 2019-03-19 DIAGNOSIS — Z981 Arthrodesis status: Secondary | ICD-10-CM | POA: Diagnosis not present

## 2019-03-19 DIAGNOSIS — M2578 Osteophyte, vertebrae: Secondary | ICD-10-CM | POA: Diagnosis not present

## 2019-03-19 DIAGNOSIS — M542 Cervicalgia: Secondary | ICD-10-CM

## 2019-03-19 DIAGNOSIS — M50323 Other cervical disc degeneration at C6-C7 level: Secondary | ICD-10-CM | POA: Diagnosis not present

## 2019-03-19 MED ORDER — GADOBENATE DIMEGLUMINE 529 MG/ML IV SOLN
20.0000 mL | Freq: Once | INTRAVENOUS | Status: AC | PRN
  Administered 2019-03-19: 20 mL via INTRAVENOUS

## 2019-03-28 DIAGNOSIS — R51 Headache: Secondary | ICD-10-CM | POA: Diagnosis not present

## 2019-04-02 ENCOUNTER — Other Ambulatory Visit: Payer: Self-pay | Admitting: Neurosurgery

## 2019-04-02 DIAGNOSIS — I10 Essential (primary) hypertension: Secondary | ICD-10-CM | POA: Diagnosis not present

## 2019-04-02 DIAGNOSIS — Z6836 Body mass index (BMI) 36.0-36.9, adult: Secondary | ICD-10-CM | POA: Diagnosis not present

## 2019-04-02 DIAGNOSIS — M50023 Cervical disc disorder at C6-C7 level with myelopathy: Secondary | ICD-10-CM | POA: Diagnosis not present

## 2019-04-05 DIAGNOSIS — F331 Major depressive disorder, recurrent, moderate: Secondary | ICD-10-CM | POA: Diagnosis not present

## 2019-04-05 DIAGNOSIS — E785 Hyperlipidemia, unspecified: Secondary | ICD-10-CM | POA: Diagnosis not present

## 2019-04-05 DIAGNOSIS — M5030 Other cervical disc degeneration, unspecified cervical region: Secondary | ICD-10-CM | POA: Diagnosis not present

## 2019-04-05 DIAGNOSIS — I10 Essential (primary) hypertension: Secondary | ICD-10-CM | POA: Diagnosis not present

## 2019-04-05 DIAGNOSIS — Z01818 Encounter for other preprocedural examination: Secondary | ICD-10-CM | POA: Diagnosis not present

## 2019-04-05 DIAGNOSIS — E1169 Type 2 diabetes mellitus with other specified complication: Secondary | ICD-10-CM | POA: Diagnosis not present

## 2019-04-05 DIAGNOSIS — Z86718 Personal history of other venous thrombosis and embolism: Secondary | ICD-10-CM | POA: Diagnosis not present

## 2019-04-05 DIAGNOSIS — E039 Hypothyroidism, unspecified: Secondary | ICD-10-CM | POA: Diagnosis not present

## 2019-04-09 ENCOUNTER — Other Ambulatory Visit: Payer: Self-pay | Admitting: Neurosurgery

## 2019-04-09 NOTE — Pre-Procedure Instructions (Signed)
[name]  04/09/2019      [id number] - [place], [place] - [address] [place] [address] Phone: [phone] Fax: [phone]    Your procedure is scheduled on April 18, 2019.  Report to [facility] Admitting at 1100 AM.  Call this number if you have problems the morning of surgery:  [phone]   Remember:  Do not eat or drink after midnight.    Take these medicines the morning of surgery with A SIP OF WATER  Tylenol-If needed Cyclobenzaprine (Flexeril)-if needed Dexlansoprazole (Dexilant) Duloxetin (Cymbalta) Fluticasone (Flonase) Levothyroxine (synthroid) Lorazepam (Ativan)-if needed Meclizine (antivert)-if needed Oxycodone-acetaminophen (percocet)-if needed Promethazine (phenergan)-if needed  Follow your surgeon's instructions on when to hold/resume aspirin.  If no instructions were given call the office to determine how they would like to you take aspirin  7 days prior to surgery STOP taking any NSAIDS: Aleve, Naproxen, Ibuprofen, Motrin, Advil, Goody's, BC's, all herbal medications, fish oil, and all vitamins  WHAT DO I DO ABOUT MY DIABETES MEDICATION?  . THE NIGHT BEFORE SURGERY, take 20 units of lantus insulin. (1/2 of your normal dose) and your normal dose of insulin lispro with dinner     . THE MORNING OF SURGERY, take NO INSULIN unless your CBG is greater than 220 mg/dL, then you may take  of your sliding scale (correction) dose of insulin.  . If your CBG is greater than 220 mg/dL, you may take  of your sliding scale (correction) dose of insulin.  Reviewed and Endorsed by [facility] Patient Education Committee, August 2015   How to Manage Your Diabetes Before and After Surgery  Why is it important to control my blood sugar before and after surgery? . Improving blood sugar levels before and after surgery helps healing and can limit problems. . A way of  improving blood sugar control is eating a healthy diet by: o  Eating less sugar and carbohydrates o  Increasing activity/exercise o  Talking with your doctor about reaching your blood sugar goals . High blood sugars (greater than 180 mg/dL) can raise your risk of infections and slow your recovery, so you will need to focus on controlling your diabetes during the weeks before surgery. . Make sure that the doctor who takes care of your diabetes knows about your planned surgery including the date and location.  How do I manage my blood sugar before surgery? . Check your blood sugar at least 4 times a day, starting 2 days before surgery, to make sure that the level is not too high or low. o Check your blood sugar the morning of your surgery when you wake up and every 2 hours until you get to the Short Stay unit. . If your blood sugar is less than 70 mg/dL, you will need to treat for low blood sugar: o Do not take insulin. o Treat a low blood sugar (less than 70 mg/dL) with  cup of clear juice (cranberry or apple), 4 glucose tablets, OR glucose gel. Recheck blood sugar in 15 minutes after treatment (to make sure it is greater than 70 mg/dL). If your blood sugar is not greater than 70 mg/dL on recheck, call [phone] o  for further instructions. . Report your blood sugar to the short stay nurse when you get to Short Stay.  . If you are admitted to the hospital after surgery: o Your blood sugar will be  checked by the staff and you will probably be given insulin after surgery (instead of oral diabetes medicines) to make sure you have good blood sugar levels. o The goal for blood sugar control after surgery is 80-180 mg/dL.   [facility]- Preparing For Surgery  Before surgery, you can play an important role. Because skin is not sterile, your skin needs to be as free of germs as possible. You can reduce the number of germs on your skin by washing with CHG (chlorahexidine gluconate) Soap before  surgery.  CHG is an antiseptic cleaner which kills germs and bonds with the skin to continue killing germs even after washing.    Oral Hygiene is also important to reduce your risk of infection.  Remember - BRUSH YOUR TEETH THE MORNING OF SURGERY WITH YOUR REGULAR TOOTHPASTE  Please do not use if you have an allergy to CHG or antibacterial soaps. If your skin becomes reddened/irritated stop using the CHG.  Do not shave (including legs and underarms) for at least 48 hours prior to first CHG shower. It is OK to shave your face.  Please follow these instructions carefully.   1. Shower the NIGHT BEFORE SURGERY and the MORNING OF SURGERY with CHG.   2. If you chose to wash your hair, wash your hair first as usual with your normal shampoo.  3. After you shampoo, rinse your hair and body thoroughly to remove the shampoo.  4. Use CHG as you would any other liquid soap. You can apply CHG directly to the skin and wash gently with a scrungie or a clean washcloth.   5. Apply the CHG Soap to your body ONLY FROM THE NECK DOWN.  Do not use on open wounds or open sores. Avoid contact with your eyes, ears, mouth and genitals (private parts). Wash Face and genitals (private parts)  with your normal soap.  6. Wash thoroughly, paying special attention to the area where your surgery will be performed.  7. Thoroughly rinse your body with warm water from the neck down.  8. DO NOT shower/wash with your normal soap after using and rinsing off the CHG Soap.  9. Pat yourself dry with a CLEAN TOWEL.  10. Wear CLEAN PAJAMAS to bed the night before surgery, wear comfortable clothes the morning of surgery  11. Place CLEAN SHEETS on your bed the night of your first shower and DO NOT SLEEP WITH PETS.  Day of Surgery:  Do not apply any deodorants/lotions.  Please wear clean clothes to the hospital/surgery center.   Remember to brush your teeth WITH YOUR REGULAR TOOTHPASTE.    Do not wear jewelry, make-up or nail  polish.  Do not wear lotions, powders, or perfumes, or deodorant.  Do not shave 48 hours prior to surgery.    Do not bring valuables to the hospital.  [facility] is not responsible for any belongings or valuables.  Contacts, dentures or bridgework may not be worn into surgery.  Leave your suitcase in the car.  After surgery it may be brought to your room.  For patients admitted to the hospital, discharge time will be determined by your treatment team.  Patients discharged the day of surgery will not be allowed to drive home.    Please read over the following fact sheets that you were given.

## 2019-04-10 ENCOUNTER — Encounter (HOSPITAL_COMMUNITY)
Admission: RE | Admit: 2019-04-10 | Discharge: 2019-04-10 | Disposition: A | Discharge status: Home / Self Care | Payer: Medicare Other | Source: Ambulatory Visit | Attending: Neurosurgery | Admitting: Neurosurgery

## 2019-04-10 ENCOUNTER — Encounter (HOSPITAL_COMMUNITY): Payer: Self-pay

## 2019-04-10 ENCOUNTER — Other Ambulatory Visit: Payer: Self-pay

## 2019-04-10 DIAGNOSIS — Z79890 Hormone replacement therapy (postmenopausal): Secondary | ICD-10-CM | POA: Insufficient documentation

## 2019-04-10 DIAGNOSIS — E785 Hyperlipidemia, unspecified: Secondary | ICD-10-CM | POA: Diagnosis not present

## 2019-04-10 DIAGNOSIS — M1710 Unilateral primary osteoarthritis, unspecified knee: Secondary | ICD-10-CM | POA: Diagnosis not present

## 2019-04-10 DIAGNOSIS — F329 Major depressive disorder, single episode, unspecified: Secondary | ICD-10-CM | POA: Diagnosis not present

## 2019-04-10 DIAGNOSIS — D509 Iron deficiency anemia, unspecified: Secondary | ICD-10-CM | POA: Insufficient documentation

## 2019-04-10 DIAGNOSIS — Z7982 Long term (current) use of aspirin: Secondary | ICD-10-CM | POA: Diagnosis not present

## 2019-04-10 DIAGNOSIS — M5001 Cervical disc disorder with myelopathy,  high cervical region: Secondary | ICD-10-CM | POA: Diagnosis not present

## 2019-04-10 DIAGNOSIS — I110 Hypertensive heart disease with heart failure: Secondary | ICD-10-CM | POA: Insufficient documentation

## 2019-04-10 DIAGNOSIS — Z9049 Acquired absence of other specified parts of digestive tract: Secondary | ICD-10-CM | POA: Diagnosis not present

## 2019-04-10 DIAGNOSIS — Z01812 Encounter for preprocedural laboratory examination: Secondary | ICD-10-CM | POA: Insufficient documentation

## 2019-04-10 DIAGNOSIS — Z8673 Personal history of transient ischemic attack (TIA), and cerebral infarction without residual deficits: Secondary | ICD-10-CM | POA: Insufficient documentation

## 2019-04-10 DIAGNOSIS — Z794 Long term (current) use of insulin: Secondary | ICD-10-CM | POA: Insufficient documentation

## 2019-04-10 DIAGNOSIS — E1110 Type 2 diabetes mellitus with ketoacidosis without coma: Secondary | ICD-10-CM | POA: Diagnosis not present

## 2019-04-10 DIAGNOSIS — Z79899 Other long term (current) drug therapy: Secondary | ICD-10-CM | POA: Insufficient documentation

## 2019-04-10 DIAGNOSIS — M797 Fibromyalgia: Secondary | ICD-10-CM | POA: Insufficient documentation

## 2019-04-10 DIAGNOSIS — Z86718 Personal history of other venous thrombosis and embolism: Secondary | ICD-10-CM | POA: Insufficient documentation

## 2019-04-10 DIAGNOSIS — E039 Hypothyroidism, unspecified: Secondary | ICD-10-CM | POA: Insufficient documentation

## 2019-04-10 DIAGNOSIS — M47819 Spondylosis without myelopathy or radiculopathy, site unspecified: Secondary | ICD-10-CM | POA: Diagnosis not present

## 2019-04-10 DIAGNOSIS — K219 Gastro-esophageal reflux disease without esophagitis: Secondary | ICD-10-CM | POA: Insufficient documentation

## 2019-04-10 DIAGNOSIS — I5030 Unspecified diastolic (congestive) heart failure: Secondary | ICD-10-CM | POA: Diagnosis not present

## 2019-04-10 HISTORY — DX: Cerebral infarction, unspecified: I63.9

## 2019-04-10 LAB — BASIC METABOLIC PANEL
Anion gap: 10 (ref 5–15)
BUN: 13 mg/dL (ref 8–23)
CO2: 25 mmol/L (ref 22–32)
Calcium: 9.6 mg/dL (ref 8.9–10.3)
Chloride: 102 mmol/L (ref 98–111)
Creatinine, Ser: 0.88 mg/dL (ref 0.44–1.00)
GFR calc Af Amer: >60 mL/min (ref >60)
GFR calc non Af Amer: >60 mL/min (ref >60)
Glucose, Bld: 217 mg/dL — ABNORMAL HIGH (ref 70–99)
Potassium: 4.8 mmol/L (ref 3.5–5.1)
Sodium: 137 mmol/L (ref 135–145)

## 2019-04-10 LAB — GLUCOSE, CAPILLARY: Glucose-Capillary: 193 mg/dL — ABNORMAL HIGH (ref 70–99)

## 2019-04-10 LAB — TYPE AND SCREEN
ABO/RH(D): O POS
Antibody Screen: NEGATIVE

## 2019-04-10 LAB — HEMOGLOBIN A1C
Hgb A1c MFr Bld: 9.2 % — ABNORMAL HIGH (ref 4.8–5.6)
Mean Plasma Glucose: 217.34 mg/dL

## 2019-04-10 LAB — CBC
HCT: 44.3 % (ref 36.0–46.0)
Hemoglobin: 14.2 g/dL (ref 12.0–15.0)
MCH: 27.8 pg (ref 26.0–34.0)
MCHC: 32.1 g/dL (ref 30.0–36.0)
MCV: 86.7 fL (ref 80.0–100.0)
Platelets: 264 10*3/uL (ref 150–400)
RBC: 5.11 MIL/uL (ref 3.87–5.11)
RDW: 12.9 % (ref 11.5–15.5)
WBC: 7 10*3/uL (ref 4.0–10.5)
nRBC: 0 % (ref 0.0–0.2)

## 2019-04-10 LAB — SURGICAL PCR SCREEN
MRSA, PCR: NEGATIVE
Staphylococcus aureus: NEGATIVE

## 2019-04-10 NOTE — Progress Notes (Signed)
[id number] - [place], [place] - [address] [place] [address] Phone: [phone] Fax: [phone]              Your procedure is scheduled on April 18, 2019.            Report to [facility] Admitting at 1100 AM.            Call this number if you have problems the morning of surgery:            [phone]             Remember:            Do not eat or drink after midnight.                        Take these medicines the morning of surgery with A SIP OF WATER  Tylenol-If needed Cyclobenzaprine (Flexeril)-if needed Dexlansoprazole (Dexilant) Duloxetin (Cymbalta) Fluticasone (Flonase) Levothyroxine (synthroid) Lorazepam (Ativan)-if needed Meclizine (antivert)-if needed Oxycodone-acetaminophen (percocet)-if needed Promethazine (phenergan)-if needed  Follow your surgeon's instructions on when to hold/resume aspirin.  If no instructions were given call the office to determine how they would like to you take aspirin  7 days prior to surgery STOP taking any NSAIDS: Aleve, Naproxen, Ibuprofen, Motrin, Advil, Goody's, BC's, all herbal medications, fish oil, and all vitamins  WHAT DO I DO ABOUT MY DIABETES MEDICATION?                                      THE MORNING OF SURGERY, take 20 units of Lantus Insulin   If your CBG is greater than 220 mg/dL, you may take  7 units of Humulog of your sliding scale (correction) dose of insulin.  Reviewed and Endorsed by [facility] Patient Education Committee, August 2015   HOW TO [facility] YOUR DIABETES BEFORE AND AFTER SURGERY  Why is it important to control my blood sugar before and after surgery?  Improving blood sugar levels before and after surgery helps healing and can limit problems.  A way of improving blood sugar control is eating a healthy diet by: ?  Eating less sugar and carbohydrates ?  Increasing activity/exercise ?  Talking  with your doctor about reaching your blood sugar goals  High blood sugars (greater than 180 mg/dL) can raise your risk of infections and slow your recovery, so you will need to focus on controlling your diabetes during the weeks before surgery.  Make sure that the doctor who takes care of your diabetes knows about your planned surgery including the date and location.  How do I manage my blood sugar before surgery?  Check your blood sugar at least 4 times a day, starting 2 days before surgery, to make sure that the level is not too high or low. ? Check your blood sugar the morning of your surgery when you wake up and every 2 hours until you get to the Short Stay unit.  If your blood sugar is less than 70 mg/dL, you will need to treat for low blood sugar: ? Do not take insulin. ? Treat a low blood sugar (less than 70 mg/dL) with  cup of clear juice (cranberry or apple), 4glucose tablets, OR glucose gel. Recheck blood sugar in 15 minutes after treatment (to make  sure it is greater than 70 mg/dL). If your blood sugar is not greater than 70 mg/dL on recheck, call [phone] ?  for further instructions.  Report your blood sugar to the short stay nurse when you get to Short Stay.   If you are admitted to the hospital after surgery: ? Your blood sugar will be checked by the staff and you will probably be given insulin after surgery (instead of oral diabetes medicines) to make sure you have good blood sugar levels. ? The goal for blood sugar control after surgery is 80-180 mg/dL.   [facility]- Preparing For Surgery  Before surgery, you can play an important role. Because skin is not sterile, your skin needs to be as free of germs as possible. You can reduce the number of germs on your skin by washing with CHG (chlorahexidine gluconate) Soap before surgery.  CHG is an antiseptic cleaner which kills germs and bonds with the skin to continue killing germs even after washing.    Oral  Hygiene is also important to reduce your risk of infection.  Remember - BRUSH YOUR TEETH THE MORNING OF SURGERY WITH YOUR REGULAR TOOTHPASTE  Please do not use if you have an allergy to CHG or antibacterial soaps. If your skin becomes reddened/irritated stop using the CHG.  Do not shave (including legs and underarms) for at least 48 hours prior to first CHG shower. It is OK to shave your face.  Please follow these instructions carefully.                                                                                                                     1. Shower the NIGHT BEFORE SURGERY and the MORNING OF SURGERY with CHG.   2. If you chose to wash your hair, wash your hair first as usual with your normal shampoo.  3. After you shampoo, rinse your hair and body thoroughly to remove the shampoo.  4. Use CHG as you would any other liquid soap. You can apply CHG directly to the skin and wash gently with a scrungie or a clean washcloth.   5. Apply the CHG Soap to your body ONLY FROM THE NECK DOWN.  Do not use on open wounds or open sores. Avoid contact with your eyes, ears, mouth and genitals (private parts). Wash Face and genitals (private parts)  with your normal soap.  6. Wash thoroughly, paying special attention to the area where your surgery will be performed.  7. Thoroughly rinse your body with warm water from the neck down.  8. DO NOT shower/wash with your normal soap after using and rinsing off the CHG Soap.  9. Pat yourself dry with a CLEAN TOWEL.  10. Wear CLEAN PAJAMAS to bed the night before surgery, wear comfortable clothes the morning of surgery  11. Place CLEAN SHEETS on your bed the night of your first shower and DO NOT SLEEP WITH PETS.  Day of Surgery:  Do not apply any deodorants/lotions.  Please wear  clean clothes to the hospital/surgery center.   Remember to brush your teeth WITH YOUR REGULAR TOOTHPASTE.                        Do not wear jewelry,  make-up or nail polish.            Do not wear lotions, powders, or perfumes, or deodorant.            Do not shave 48 hours prior to surgery.              Do not bring valuables to the hospital.            [facility] is not responsible for any belongings or valuables.  Contacts, dentures or bridgework may not be worn into surgery.  Leave your suitcase in the car.  After surgery it may be brought to your room.  For patients admitted to the hospital, discharge time will be determined by your treatment team.  Patients discharged the day of surgery will not be allowed to drive home.    Please read over the following fact sheets that you were given.

## 2019-04-10 NOTE — Progress Notes (Addendum)
PCP - Dr. Deland Pretty  Cardiologist - Radford Pax, T.  Chest x-ray - 02/26/2019 (E)  EKG - 02/26/2019 (E)  Stress Test - Denies  ECHO - Denies  Cardiac Cath - Denies  AICD-na PM-na LOOP-na  Sleep Study - Denies CPAP - None  LABS- 04/10/2019: CBC, BMP, T/S, PCR 04/16/2019: COVID  ASA- LD- 9/9  ERAS- None  HA1C-04/10/2019 Fasting Blood Sugar - 63-440, today 193 Checks Blood Sugar __1___ times a day  Anesthesia- Yes- elevated A1C.  Pt denies having chest pain, sob, or fever at this time. All instructions explained to the pt, with a verbal understanding of the material. Pt agrees to go over the instructions while at home for a better understanding. Pt also instructed to self quarantine after being tested for COVID-19. The opportunity to ask questions was provided.   Coronavirus Screening  Have you experienced the following symptoms:  Cough yes/no: No Fever (>100.89F)  yes/no: No Runny nose yes/no: No Sore throat yes/no: No Difficulty breathing/shortness of breath  yes/no: No  Have you or a family member traveled in the last 14 days and where? yes/no: No    If the patient indicates "YES" to the above questions, their PAT will be rescheduled to limit the exposure to others and, the surgeon will be notified. THE PATIENT WILL NEED TO BE ASYMPTOMATIC FOR 14 DAYS.   If the patient is not experiencing any of these symptoms, the PAT nurse will instruct them to NOT bring anyone with them to their appointment since they may have these symptoms or traveled as well.   Please remind your patients and families that hospital visitation restrictions are in effect and the importance of the restrictions.

## 2019-04-11 ENCOUNTER — Encounter (HOSPITAL_COMMUNITY): Payer: Self-pay

## 2019-04-11 NOTE — Anesthesia Preprocedure Evaluation (Addendum)
Anesthesia Evaluation  Patient identified by MRN, date of birth, ID band Patient awake    Reviewed: Allergy & Precautions, NPO status , Patient's Chart, lab work & pertinent test results  History of Anesthesia Complications (+) PONV  Airway Mallampati: II  TM Distance: >3 FB Neck ROM: Full    Dental no notable dental hx.    Pulmonary neg pulmonary ROS,    Pulmonary exam normal breath sounds clear to auscultation       Cardiovascular hypertension, Pt. on medications + DVT  Normal cardiovascular exam+ Valvular Problems/Murmurs AI  Rhythm:Regular Rate:Normal  ECG: SR, rate 80  ECHO: Left ventricle: The cavity size was normal. Wall thickness was normal. Systolic function was normal. The estimated ejection fraction was in the range of 55% to 60%. Aortic valve: No evidence of vegetation. There was mild regurgitation. Mitral valve: No evidence of vegetation. There was mild regurgitation. Left atrium: No evidence of thrombus in the appendage. Right atrium: No evidence of thrombus in the atrial cavity or appendage. Atrial septum: No defect or patent foramen ovale was identified. Echo contrast study showed no right-to-left atrial level shunt, following an increase in RA pressure induced by provocative maneuvers. Tricuspid valve: No evidence of vegetation. Pulmonic valve: No evidence of vegetation. Superior vena cava: The study excluded a thrombus.   Neuro/Psych PSYCHIATRIC DISORDERS Anxiety Depression  Neuromuscular disease CVA, No Residual Symptoms    GI/Hepatic Neg liver ROS, hiatal hernia, GERD  Medicated and Controlled,  Endo/Other  diabetes, Insulin DependentHypothyroidism   Renal/GU negative Renal ROS     Musculoskeletal  (+) Arthritis , Fibromyalgia -RLS (restless legs syndrome)   Abdominal (+) + obese,   Peds  Hematology HLD   Anesthesia Other Findings CERVICAL DISC DISORDER AT CERVICAL 6- CERVICAL 7 LEVLE WITH  MYELOPATHY  Reproductive/Obstetrics                           Anesthesia Physical Anesthesia Plan  ASA: III  Anesthesia Plan: General   Post-op Pain Management:    Induction: Intravenous  PONV Risk Score and Plan: 4 or greater and Ondansetron, Dexamethasone, Midazolam and Treatment may vary due to age or medical condition  Airway Management Planned: Oral ETT and Video Laryngoscope Planned  Additional Equipment:   Intra-op Plan:   Post-operative Plan: Extubation in OR  Informed Consent: I have reviewed the patients History and Physical, chart, labs and discussed the procedure including the risks, benefits and alternatives for the proposed anesthesia with the patient or authorized representative who has indicated his/her understanding and acceptance.     Dental advisory given  Plan Discussed with: CRNA  Anesthesia Plan Comments: (Reviewed PAT note written by Myra Gianotti, PA-C. )      Anesthesia Quick Evaluation

## 2019-04-11 NOTE — Progress Notes (Signed)
Anesthesia Chart Review:  Case: [id number] Date/Time: 04/18/19 1244   Procedure: ANTERIOR CERVICAL DECOMPRESSION/DISCECTOMY  CERVICAL 6- CERVICAL 7 (N/A ) - ANTERIOR CERVICAL DECOMPRESSION/DISCECTOMY CERVICAL 6- CERVICAL 7   Anesthesia type: General   Pre-op diagnosis: CERVICAL DISC DISORDER AT CERVICAL 6- CERVICAL 7 LEVLE WITH MYELOPATHY   Location: [facility] OR ROOM 20 / [facility]   Surgeon: Newman Pies, MD      DISCUSSION: Patient is a 68 year old female scheduled for the above procedure.  History includes never smoker, post-operative N/V, HTN, hypothyroidism, HLD, DVT (x2, last acute DVT left soleal vein [id number]), diastolic dysfunction, aortic insufficiency (mild AI 11/2017), DM2 (DKA, 11/2017), CVA (11/2017), anemia, GERD, hiatal hernia, back surgeries (L4-5/L5-S1 PLIF 09/01/03; L3-4 PLIF 11/15/11; L2-3 PLIF 11/15/17), C3-6 ACDF (03/23/17), ascending colectomy (for villous adenoma, cecum with high grade dysplasia 04/08/16). Normal coronaries by 2014 cath. BMI is consistent with obesity.  - Of note, patient discharged home on 11/17/17 following 11/15/17 PLIF. Had issues with hypo- and -hyperglycemia during hospitalization. Discharged home with HGB of 7.3, unchanged from her POD#1 results. On 11/18/08, EMS called to her home due to [place]. EMS found patient non-responsive, diaphoretic, glucose > 600. She did not respond to Narcan. Patient became apneic and required intubation on arrival to ED.  Patient admitted to ICU with DKA and anemia (HGB 6.8). Given insulin, PRBC, pressors, and antibiotics (RUL Klebsiella PNA). Extubated 11/21/18. Due to continued encephalopathy, neurology consulted and imaging revealed small bilateral frontal infarcts. TEE did not reveal source of emboli. Venous Duplex revealed left soleal DVT requiring Xarelto. Discharged 11/25/17.  Nikki at Dr. Adline Mango office notified of A1c 9.2% and that reportedly last ASA 04/17/19. She will touch base with patient, as ASA to be held for 5 days. They have already  reached out to PCP regarding surgery plans. Lexine Baton will also discuss A1c results with Dr. Arnoldo Morale, particularly given complicated post-operative course following her last surgery. I will enter order for post-operative DM Coordinator consult.   COVID-19 presurgical test is scheduled for 04/16/19. Anesthesia team to evaluate on the day of surgery.    VS: BP (!) 145/90   Pulse 100   Temp 36.8 C   Resp 20   Ht 5' 3.5" (1.613 m)   Wt 93.8 kg   SpO2 99%   BMI 36.04 kg/m     PROVIDERS: Harlan Stains, MD is PCP - Fransico Him, MD is cardiologist. Initially seen in 2014 for chest pain and had an abnormal stress test following by [facility] showing normal coronaries. Last seen by Dr. Radford Pax on 09/04/17 for presyncope with PACs. Echo and event monitor ordered (see below) with as needed follow-up. Last cardiology encounter was for 11/23/17 TEE as part of CVA work-up.  Minette Brine, MD is endocrinologist   LABS: Preoperative labs noted. A1c 9.2, consistent with average glucose 217.  (all labs ordered are listed, but only abnormal results are displayed)  Labs Reviewed  GLUCOSE, CAPILLARY - Abnormal; Notable for the following components:      Result Value   Glucose-Capillary 193 (*)    All other components within normal limits  BASIC METABOLIC PANEL - Abnormal; Notable for the following components:   Glucose, Bld 217 (*)    All other components within normal limits  HEMOGLOBIN A1C - Abnormal; Notable for the following components:   Hgb A1c MFr Bld 9.2 (*)    All other components within normal limits  SURGICAL PCR SCREEN  CBC  TYPE AND SCREEN     IMAGES: MRI  C-spine 03/19/19: IMPRESSION: 1. Surgical changes with anterior and interbody fusions at C3-4, C4-5 and C5-6. No complicating features. 2. Broad-based central disc protrusion and osteophytic ridging at C6-7 with mass effect on the ventral thecal sac and near obliteration of the ventral CSF space. Mild foraminal  encroachment bilaterally.  CXR 02/25/19: FINDINGS: Cardiac shadow is stable. Mild aortic calcifications are seen. The lungs are well aerated without focal infiltrate or sizable effusion. Postsurgical changes in the lumbar and cervical spines are seen. Degenerative change of the thoracic spine is noted. IMPRESSION: No active cardiopulmonary disease.   EKG: 02/26/19: Sinus rhythm Borderline left axis deviation No significant change was found Confirmed by Ezequiel Essex [phone]) on 02/26/2019 3:07:11 AM   CV: TEE 11/23/17: Study Conclusions - Left ventricle: The cavity size was normal. Wall thickness was   normal. Systolic function was normal. The estimated ejection   fraction was in the range of 55% to 60%. - Aortic valve: No evidence of vegetation. There was mild   regurgitation. - Mitral valve: No evidence of vegetation. There was mild   regurgitation. - Left atrium: No evidence of thrombus in the appendage. - Right atrium: No evidence of thrombus in the atrial cavity or   appendage. - Atrial septum: No defect or patent foramen ovale was identified.   Echo contrast study showed no right-to-left atrial level shunt,   following an increase in RA pressure induced by provocative   maneuvers. - Tricuspid valve: No evidence of vegetation. - Pulmonic valve: No evidence of vegetation. - Superior vena cava: The study excluded a thrombus. Impressions: - No cardiac source of emboli was indentified.  Echo (TTE) 09/08/17: Study Conclusions - Left ventricle: The cavity size was normal. Wall thickness was   increased in a pattern of moderate LVH. Systolic function was   vigorous. The estimated ejection fraction was in the range of 65%   to 70%. Wall motion was normal; there were no regional wall   motion abnormalities. Doppler parameters are consistent with   abnormal left ventricular relaxation (grade 1 diastolic   dysfunction). - Mitral valve: Calcified annulus. - Left atrium: The  atrium was mildly dilated.  Cardiac event monitor 09/08/17-10/07/17:   Normal sinus rhtyhm with average heart rate 95bpm. Sinus tachycardia up to 120bpm.  Frequent PACs  Cardiac cath 05/03/13: IMPRESSIONS: 1. No angiographically significant CAD 2. Normal left ventricular systolic function.  LVEDP 18 mmHg.  Ejection fraction 60%.  Nuclear stress test 05/02/13: IMPRESSION: 1. Possible small area of pharmacologically induced ischemia involving the inferior lateral wall of the left ventricle. 2. No scintigraphic evidence of prior infarction. 3. Normal wall motion. Ejection fraction - 63%. Cardiac cath recommended.   Past Medical History:  Diagnosis Date  . Anemia    iron deficiency  . Arthritis   . Blood transfusion   . Cervical spondylosis with myelopathy and radiculopathy   . CVA (cerebral vascular accident) ([place])    11/2017  . Depression   . Diabetes mellitus    Type II  . Diastolic dysfunction   . Diverticulitis   . DJD (degenerative joint disease)   . DVT (deep venous thrombosis) ([facility])    times 2 lower leg  . Endometriosis   . Family history of adverse reaction to anesthesia    mom and dad PONV  . Fibromyalgia   . GERD (gastroesophageal reflux disease)    occ  . Hiatal hernia   . History of kidney stones   . Hyperlipidemia   . Hypertension   .  Hypothyroidism   . Insomnia   . Mild aortic insufficiency    by echo 04/2013  . Osteoarthritis    back and knee  . Ovarian cyst   . PONV (postoperative nausea and vomiting)   . RLS (restless legs syndrome)   . Thoracic compression fracture ([place])   . Uterine fibroid   . UTI (lower urinary tract infection)   . Villous adenoma of right colon 04/08/2016  . Vitamin D deficiency disease     Past Surgical History:  Procedure Laterality Date  . ANTERIOR CERVICAL DECOMP/DISCECTOMY FUSION N/A 03/23/2017   Procedure: ANTERIOR CERVICAL DECOMPRESSION/DISCECTOMY FUSION, INTERBODY PROSTHESIS,PLATE CERVICAL THREE- CERVICAL FOUR,  CERVICAL FOUR- CERVICAL FIVE, CERVICAL FIVE- CERVICAL SIX;  Surgeon: Newman Pies, MD;  Location: [facility];  Service: Neurosurgery;  Laterality: N/A;  . APPENDECTOMY    . BACK SURGERY  ,2005   April  2013 - spinal fusion[facility]  . BREAST SURGERY     breast reduction  . CARDIAC CATHETERIZATION  04/2013   normal coronary arteries and normal LVF  . CARPAL TUNNEL RELEASE  06/05/2012   Procedure: CARPAL TUNNEL RELEASE;  Surgeon: Cammie Sickle., MD;  Location: [facility];  Service: Orthopedics;  Laterality: Left;  . CESAREAN SECTION     x 2  . CHOLECYSTECTOMY    . COLECTOMY  04/08/2016  . DILATION AND CURETTAGE OF UTERUS    . DORSAL COMPARTMENT RELEASE  06/05/2012   Procedure: RELEASE DORSAL COMPARTMENT (DEQUERVAIN);  Surgeon: Cammie Sickle., MD;  Location: [facility];  Service: Orthopedics;  Laterality: Left;  Excision of mixoid cyst also  . EYE SURGERY     cataracts bilateral  . JOINT REPLACEMENT     Bilateral knee  . KNEE ARTHROPLASTY  09   lft partial  . KNEE ARTHROPLASTY     rt  . LAPAROSCOPIC PARTIAL COLECTOMY N/A 04/08/2016   Procedure: LAPAROSCOPIC ASSISTED ASCENDING COLECTOMY POSSIBLE OPEN COLECTOMY;  Surgeon: Fanny Skates, MD;  Location: [facility];  Service: General;  Laterality: N/A;  . orthopedic surgeries     multiple  . REDUCTION MAMMAPLASTY Bilateral   . SHOULDER OPEN ROTATOR CUFF REPAIR     rt and lft  . TEE WITHOUT CARDIOVERSION N/A 11/23/2017   Procedure: TRANSESOPHAGEAL ECHOCARDIOGRAM (TEE);  Surgeon: Jerline Pain, MD;  Location[facility] ENDOSCOPY;  Service: Cardiovascular;  Laterality: N/A;  . TUBAL LIGATION     btsp    MEDICATIONS: . acetaminophen (TYLENOL) 500 MG tablet  . amLODipine (NORVASC) 2.5 MG tablet  . aspirin EC 81 MG tablet  . clobetasol (TEMOVATE) 0.05 % external solution  . Cyanocobalamin (B-12) 2500 MCG TABS  . cyclobenzaprine (FLEXERIL) 10 MG tablet  . dexlansoprazole (DEXILANT) 60 MG capsule  . docusate sodium  (COLACE) 100 MG capsule  . DULoxetine (CYMBALTA) 60 MG capsule  . ferrous sulfate 325 (65 FE) MG tablet  . fluticasone (FLONASE) 50 MCG/ACT nasal spray  . furosemide (LASIX) 20 MG tablet  . gabapentin (NEURONTIN) 300 MG capsule  . ibandronate (BONIVA) 150 MG tablet  . ibuprofen (ADVIL) 200 MG tablet  . insulin glargine (LANTUS) 100 UNIT/ML injection  . insulin lispro (HUMALOG KWIKPEN) 100 UNIT/ML KwikPen  . irbesartan (AVAPRO) 300 MG tablet  . levothyroxine (SYNTHROID, LEVOTHROID) 150 MCG tablet  . LORazepam (ATIVAN) 0.5 MG tablet  . meclizine (ANTIVERT) 12.5 MG tablet  . oxyCODONE (OXY IR/ROXICODONE) 5 MG immediate release tablet  . oxyCODONE-acetaminophen (PERCOCET/ROXICET) 5-325 MG tablet  . Pitavastatin Calcium (LIVALO)  2 MG TABS  . Probiotic Product (PROBIOTIC PO)  . promethazine (PHENERGAN) 25 MG tablet  . temazepam (RESTORIL) 15 MG capsule   No current facility-administered medications for this encounter.   Xarelto was stopped earlier this year since she was > 3-6 months out from DVT.   Myra Gianotti, PA-C Surgical Short Stay/Anesthesiology [facility] Phone [phone] [facility] [place] Phone [phone] 04/11/2019 2:49 PM

## 2019-04-16 ENCOUNTER — Other Ambulatory Visit (HOSPITAL_COMMUNITY)
Admission: RE | Admit: 2019-04-16 | Discharge: 2019-04-16 | Disposition: A | Discharge status: Home / Self Care | Payer: Medicare Other | Source: Ambulatory Visit | Attending: Neurosurgery | Admitting: Neurosurgery

## 2019-04-16 DIAGNOSIS — Z01812 Encounter for preprocedural laboratory examination: Secondary | ICD-10-CM | POA: Insufficient documentation

## 2019-04-16 DIAGNOSIS — Z20828 Contact with and (suspected) exposure to other viral communicable diseases: Secondary | ICD-10-CM | POA: Diagnosis not present

## 2019-04-17 LAB — SARS CORONAVIRUS 2 (TAT 6-24 HRS): SARS Coronavirus 2: NEGATIVE

## 2019-04-18 ENCOUNTER — Ambulatory Visit (HOSPITAL_COMMUNITY): Payer: Medicare Other

## 2019-04-18 ENCOUNTER — Ambulatory Visit (HOSPITAL_COMMUNITY): Payer: Medicare Other | Admitting: Vascular Surgery

## 2019-04-18 ENCOUNTER — Ambulatory Visit (HOSPITAL_COMMUNITY)
Admission: RE | Admit: 2019-04-18 | Discharge: 2019-04-19 | Disposition: A | Discharge status: Home / Self Care | Payer: Medicare Other | Attending: Neurosurgery | Admitting: Neurosurgery

## 2019-04-18 ENCOUNTER — Encounter (HOSPITAL_COMMUNITY)
Admission: RE | Disposition: A | Discharge status: Home / Self Care | Payer: Self-pay | Source: Home / Self Care | Attending: Neurosurgery

## 2019-04-18 ENCOUNTER — Other Ambulatory Visit: Payer: Self-pay

## 2019-04-18 ENCOUNTER — Encounter (HOSPITAL_COMMUNITY): Payer: Self-pay

## 2019-04-18 DIAGNOSIS — F329 Major depressive disorder, single episode, unspecified: Secondary | ICD-10-CM | POA: Diagnosis not present

## 2019-04-18 DIAGNOSIS — Z79890 Hormone replacement therapy (postmenopausal): Secondary | ICD-10-CM | POA: Insufficient documentation

## 2019-04-18 DIAGNOSIS — Z96653 Presence of artificial knee joint, bilateral: Secondary | ICD-10-CM | POA: Diagnosis not present

## 2019-04-18 DIAGNOSIS — F419 Anxiety disorder, unspecified: Secondary | ICD-10-CM | POA: Diagnosis not present

## 2019-04-18 DIAGNOSIS — Z7982 Long term (current) use of aspirin: Secondary | ICD-10-CM | POA: Diagnosis not present

## 2019-04-18 DIAGNOSIS — Z9049 Acquired absence of other specified parts of digestive tract: Secondary | ICD-10-CM | POA: Diagnosis not present

## 2019-04-18 DIAGNOSIS — K219 Gastro-esophageal reflux disease without esophagitis: Secondary | ICD-10-CM | POA: Insufficient documentation

## 2019-04-18 DIAGNOSIS — Z86718 Personal history of other venous thrombosis and embolism: Secondary | ICD-10-CM | POA: Insufficient documentation

## 2019-04-18 DIAGNOSIS — E1136 Type 2 diabetes mellitus with diabetic cataract: Secondary | ICD-10-CM | POA: Insufficient documentation

## 2019-04-18 DIAGNOSIS — Z7983 Long term (current) use of bisphosphonates: Secondary | ICD-10-CM | POA: Insufficient documentation

## 2019-04-18 DIAGNOSIS — Z794 Long term (current) use of insulin: Secondary | ICD-10-CM | POA: Insufficient documentation

## 2019-04-18 DIAGNOSIS — Z79899 Other long term (current) drug therapy: Secondary | ICD-10-CM | POA: Insufficient documentation

## 2019-04-18 DIAGNOSIS — Z419 Encounter for procedure for purposes other than remedying health state, unspecified: Secondary | ICD-10-CM

## 2019-04-18 DIAGNOSIS — G4700 Insomnia, unspecified: Secondary | ICD-10-CM | POA: Diagnosis not present

## 2019-04-18 DIAGNOSIS — E039 Hypothyroidism, unspecified: Secondary | ICD-10-CM | POA: Insufficient documentation

## 2019-04-18 DIAGNOSIS — Z981 Arthrodesis status: Secondary | ICD-10-CM | POA: Diagnosis not present

## 2019-04-18 DIAGNOSIS — M4722 Other spondylosis with radiculopathy, cervical region: Secondary | ICD-10-CM | POA: Diagnosis not present

## 2019-04-18 DIAGNOSIS — D509 Iron deficiency anemia, unspecified: Secondary | ICD-10-CM | POA: Diagnosis not present

## 2019-04-18 DIAGNOSIS — M4802 Spinal stenosis, cervical region: Secondary | ICD-10-CM | POA: Insufficient documentation

## 2019-04-18 DIAGNOSIS — Z8673 Personal history of transient ischemic attack (TIA), and cerebral infarction without residual deficits: Secondary | ICD-10-CM | POA: Insufficient documentation

## 2019-04-18 DIAGNOSIS — I10 Essential (primary) hypertension: Secondary | ICD-10-CM | POA: Diagnosis not present

## 2019-04-18 DIAGNOSIS — E785 Hyperlipidemia, unspecified: Secondary | ICD-10-CM | POA: Insufficient documentation

## 2019-04-18 DIAGNOSIS — M79601 Pain in right arm: Secondary | ICD-10-CM | POA: Diagnosis present

## 2019-04-18 DIAGNOSIS — M50123 Cervical disc disorder at C6-C7 level with radiculopathy: Secondary | ICD-10-CM | POA: Insufficient documentation

## 2019-04-18 HISTORY — PX: ANTERIOR CERVICAL DECOMP/DISCECTOMY FUSION: SHX1161

## 2019-04-18 LAB — GLUCOSE, CAPILLARY
Glucose-Capillary: 100 mg/dL — ABNORMAL HIGH (ref 70–99)
Glucose-Capillary: 137 mg/dL — ABNORMAL HIGH (ref 70–99)
Glucose-Capillary: 157 mg/dL — ABNORMAL HIGH (ref 70–99)
Glucose-Capillary: 178 mg/dL — ABNORMAL HIGH (ref 70–99)
Glucose-Capillary: 241 mg/dL — ABNORMAL HIGH (ref 70–99)

## 2019-04-18 SURGERY — ANTERIOR CERVICAL DECOMPRESSION/DISCECTOMY FUSION 1 LEVEL
Anesthesia: General | Site: Spine Cervical

## 2019-04-18 MED ORDER — LACTATED RINGERS IV SOLN
INTRAVENOUS | Status: DC
  Administered 2019-04-18: 12:00:00 via INTRAVENOUS

## 2019-04-18 MED ORDER — LORAZEPAM 0.5 MG PO TABS
0.5000 mg | ORAL_TABLET | Freq: Three times a day (TID) | ORAL | Status: DC | PRN
  Administered 2019-04-18: 0.5 mg via ORAL
  Filled 2019-04-18: qty 1

## 2019-04-18 MED ORDER — LIDOCAINE 2% (20 MG/ML) 5 ML SYRINGE
INTRAMUSCULAR | Status: DC | PRN
  Administered 2019-04-18: 60 mg via INTRAVENOUS

## 2019-04-18 MED ORDER — OXYCODONE-ACETAMINOPHEN 5-325 MG PO TABS: 1.0000 | ORAL_TABLET | ORAL | Status: DC | PRN

## 2019-04-18 MED ORDER — ROCURONIUM BROMIDE 10 MG/ML (PF) SYRINGE
PREFILLED_SYRINGE | INTRAVENOUS | Status: AC
  Filled 2019-04-18: qty 10

## 2019-04-18 MED ORDER — DEXAMETHASONE SODIUM PHOSPHATE 10 MG/ML IJ SOLN
INTRAMUSCULAR | Status: AC
  Filled 2019-04-18: qty 1

## 2019-04-18 MED ORDER — ONDANSETRON HCL 4 MG/2ML IJ SOLN
INTRAMUSCULAR | Status: DC | PRN
  Administered 2019-04-18: 4 mg via INTRAVENOUS

## 2019-04-18 MED ORDER — ONDANSETRON HCL 4 MG/2ML IJ SOLN
INTRAMUSCULAR | Status: AC
  Filled 2019-04-18: qty 4

## 2019-04-18 MED ORDER — ACETAMINOPHEN 500 MG PO TABS
1000.0000 mg | ORAL_TABLET | Freq: Four times a day (QID) | ORAL | Status: DC
  Administered 2019-04-18 – 2019-04-19 (×3): 1000 mg via ORAL
  Filled 2019-04-18 (×3): qty 2

## 2019-04-18 MED ORDER — PANTOPRAZOLE SODIUM 40 MG IV SOLR
40.0000 mg | Freq: Every day | INTRAVENOUS | Status: DC
  Administered 2019-04-18: 40 mg via INTRAVENOUS
  Filled 2019-04-18: qty 40

## 2019-04-18 MED ORDER — FUROSEMIDE 20 MG PO TABS: 20.0000 mg | ORAL_TABLET | Freq: Every day | ORAL | Status: DC | PRN

## 2019-04-18 MED ORDER — LIDOCAINE 2% (20 MG/ML) 5 ML SYRINGE
INTRAMUSCULAR | Status: AC
  Filled 2019-04-18: qty 5

## 2019-04-18 MED ORDER — FENTANYL CITRATE (PF) 100 MCG/2ML IJ SOLN: 25.0000 ug | INTRAMUSCULAR | Status: DC | PRN

## 2019-04-18 MED ORDER — PROPOFOL 10 MG/ML IV BOLUS
INTRAVENOUS | Status: AC
  Filled 2019-04-18: qty 20

## 2019-04-18 MED ORDER — MIDAZOLAM HCL 2 MG/2ML IJ SOLN
INTRAMUSCULAR | Status: AC
  Filled 2019-04-18: qty 2

## 2019-04-18 MED ORDER — ONDANSETRON HCL 4 MG/2ML IJ SOLN: 4.0000 mg | Freq: Once | INTRAMUSCULAR | Status: DC | PRN

## 2019-04-18 MED ORDER — BISACODYL 10 MG RE SUPP: 10.0000 mg | Freq: Every day | RECTAL | Status: DC | PRN

## 2019-04-18 MED ORDER — BUPIVACAINE-EPINEPHRINE (PF) 0.5% -1:200000 IJ SOLN
INTRAMUSCULAR | Status: DC | PRN
  Administered 2019-04-18: 10 mL

## 2019-04-18 MED ORDER — BACITRACIN ZINC 500 UNIT/GM EX OINT
TOPICAL_OINTMENT | CUTANEOUS | Status: AC
  Filled 2019-04-18: qty 28.35

## 2019-04-18 MED ORDER — INSULIN ASPART 100 UNIT/ML ~~LOC~~ SOLN
0.0000 [IU] | SUBCUTANEOUS | Status: DC
  Administered 2019-04-18: 7 [IU] via SUBCUTANEOUS
  Administered 2019-04-18: 4 [IU] via SUBCUTANEOUS
  Administered 2019-04-19: 7 [IU] via SUBCUTANEOUS
  Administered 2019-04-19: 11 [IU] via SUBCUTANEOUS
  Administered 2019-04-19: 15 [IU] via SUBCUTANEOUS

## 2019-04-18 MED ORDER — BUPIVACAINE-EPINEPHRINE (PF) 0.5% -1:200000 IJ SOLN
INTRAMUSCULAR | Status: AC
  Filled 2019-04-18: qty 30

## 2019-04-18 MED ORDER — DEXAMETHASONE SODIUM PHOSPHATE 4 MG/ML IJ SOLN
4.0000 mg | Freq: Four times a day (QID) | INTRAMUSCULAR | Status: AC
  Administered 2019-04-18 – 2019-04-19 (×2): 4 mg via INTRAVENOUS
  Filled 2019-04-18 (×2): qty 1

## 2019-04-18 MED ORDER — ONDANSETRON HCL 4 MG/2ML IJ SOLN: 4.0000 mg | Freq: Four times a day (QID) | INTRAMUSCULAR | Status: DC | PRN

## 2019-04-18 MED ORDER — DEXAMETHASONE 4 MG PO TABS: 4.0000 mg | ORAL_TABLET | Freq: Four times a day (QID) | ORAL | Status: AC

## 2019-04-18 MED ORDER — ONDANSETRON HCL 4 MG/2ML IJ SOLN
INTRAMUSCULAR | Status: AC
  Filled 2019-04-18: qty 2

## 2019-04-18 MED ORDER — ONDANSETRON HCL 4 MG PO TABS: 4.0000 mg | ORAL_TABLET | Freq: Four times a day (QID) | ORAL | Status: DC | PRN

## 2019-04-18 MED ORDER — GLYCOPYRROLATE PF 0.2 MG/ML IJ SOSY
PREFILLED_SYRINGE | INTRAMUSCULAR | Status: AC
  Filled 2019-04-18: qty 2

## 2019-04-18 MED ORDER — NEOSTIGMINE METHYLSULFATE 3 MG/3ML IV SOSY
PREFILLED_SYRINGE | INTRAVENOUS | Status: AC
  Filled 2019-04-18: qty 3

## 2019-04-18 MED ORDER — AMLODIPINE BESYLATE 5 MG PO TABS
5.0000 mg | ORAL_TABLET | Freq: Every day | ORAL | Status: DC
  Administered 2019-04-19: 5 mg via ORAL
  Filled 2019-04-18: qty 1

## 2019-04-18 MED ORDER — ACETAMINOPHEN 500 MG PO TABS
1000.0000 mg | ORAL_TABLET | Freq: Once | ORAL | Status: AC
  Administered 2019-04-18: 1000 mg via ORAL
  Filled 2019-04-18: qty 2

## 2019-04-18 MED ORDER — SUGAMMADEX SODIUM 200 MG/2ML IV SOLN
INTRAVENOUS | Status: DC | PRN
  Administered 2019-04-18: 200 mg via INTRAVENOUS

## 2019-04-18 MED ORDER — SUGAMMADEX SODIUM 500 MG/5ML IV SOLN
INTRAVENOUS | Status: AC
  Filled 2019-04-18: qty 5

## 2019-04-18 MED ORDER — THROMBIN 5000 UNITS EX SOLR
OROMUCOSAL | Status: DC | PRN
  Administered 2019-04-18 (×2): 5 mL via TOPICAL

## 2019-04-18 MED ORDER — FENTANYL CITRATE (PF) 250 MCG/5ML IJ SOLN
INTRAMUSCULAR | Status: AC
  Filled 2019-04-18: qty 5

## 2019-04-18 MED ORDER — 0.9 % SODIUM CHLORIDE (POUR BTL) OPTIME
TOPICAL | Status: DC | PRN
  Administered 2019-04-18: 1000 mL

## 2019-04-18 MED ORDER — PHENOL 1.4 % MT LIQD: 1.0000 | OROMUCOSAL | Status: DC | PRN

## 2019-04-18 MED ORDER — EPHEDRINE 5 MG/ML INJ
INTRAVENOUS | Status: AC
  Filled 2019-04-18: qty 10

## 2019-04-18 MED ORDER — THROMBIN 5000 UNITS EX SOLR
CUTANEOUS | Status: AC
  Filled 2019-04-18: qty 5000

## 2019-04-18 MED ORDER — SUCCINYLCHOLINE CHLORIDE 200 MG/10ML IV SOSY
PREFILLED_SYRINGE | INTRAVENOUS | Status: AC
  Filled 2019-04-18: qty 10

## 2019-04-18 MED ORDER — VANCOMYCIN HCL IN DEXTROSE 1-5 GM/200ML-% IV SOLN
1000.0000 mg | Freq: Once | INTRAVENOUS | Status: AC
  Administered 2019-04-19: 1000 mg via INTRAVENOUS
  Filled 2019-04-18: qty 200

## 2019-04-18 MED ORDER — IRBESARTAN 300 MG PO TABS
300.0000 mg | ORAL_TABLET | Freq: Every day | ORAL | Status: DC
  Administered 2019-04-19: 300 mg via ORAL
  Filled 2019-04-18: qty 1

## 2019-04-18 MED ORDER — MENTHOL 3 MG MT LOZG: 1.0000 | LOZENGE | OROMUCOSAL | Status: DC | PRN

## 2019-04-18 MED ORDER — DEXAMETHASONE SODIUM PHOSPHATE 10 MG/ML IJ SOLN
INTRAMUSCULAR | Status: DC | PRN
  Administered 2019-04-18: 10 mg via INTRAVENOUS

## 2019-04-18 MED ORDER — LEVOTHYROXINE SODIUM 75 MCG PO TABS
150.0000 ug | ORAL_TABLET | Freq: Every day | ORAL | Status: DC
  Administered 2019-04-19: 150 ug via ORAL
  Filled 2019-04-18: qty 2

## 2019-04-18 MED ORDER — ACETAMINOPHEN 325 MG PO TABS
650.0000 mg | ORAL_TABLET | ORAL | Status: DC | PRN
  Filled 2019-04-18: qty 2

## 2019-04-18 MED ORDER — PROMETHAZINE HCL 25 MG PO TABS: 25.0000 mg | ORAL_TABLET | Freq: Three times a day (TID) | ORAL | Status: DC | PRN

## 2019-04-18 MED ORDER — VANCOMYCIN HCL IN DEXTROSE 1-5 GM/200ML-% IV SOLN
INTRAVENOUS | Status: AC
  Filled 2019-04-18: qty 200

## 2019-04-18 MED ORDER — CYCLOBENZAPRINE HCL 10 MG PO TABS
10.0000 mg | ORAL_TABLET | Freq: Three times a day (TID) | ORAL | Status: DC | PRN
  Administered 2019-04-19 (×2): 10 mg via ORAL
  Filled 2019-04-18 (×3): qty 1

## 2019-04-18 MED ORDER — SODIUM CHLORIDE 0.9 % IV SOLN
INTRAVENOUS | Status: DC | PRN
  Administered 2019-04-18: 25 ug/min via INTRAVENOUS

## 2019-04-18 MED ORDER — CLOBETASOL PROPIONATE 0.05 % EX CREA
1.0000 "application " | TOPICAL_CREAM | Freq: Two times a day (BID) | CUTANEOUS | Status: DC | PRN
  Filled 2019-04-18: qty 15

## 2019-04-18 MED ORDER — DOCUSATE SODIUM 100 MG PO CAPS
100.0000 mg | ORAL_CAPSULE | Freq: Two times a day (BID) | ORAL | Status: DC
  Administered 2019-04-18 – 2019-04-19 (×2): 100 mg via ORAL
  Filled 2019-04-18 (×2): qty 1

## 2019-04-18 MED ORDER — FENTANYL CITRATE (PF) 100 MCG/2ML IJ SOLN
INTRAMUSCULAR | Status: DC | PRN
  Administered 2019-04-18: 100 ug via INTRAVENOUS
  Administered 2019-04-18: 50 ug via INTRAVENOUS

## 2019-04-18 MED ORDER — GLYCOPYRROLATE PF 0.2 MG/ML IJ SOSY
PREFILLED_SYRINGE | INTRAMUSCULAR | Status: AC
  Filled 2019-04-18: qty 1

## 2019-04-18 MED ORDER — ALUM & MAG HYDROXIDE-SIMETH 200-200-20 MG/5ML PO SUSP: 30.0000 mL | Freq: Four times a day (QID) | ORAL | Status: DC | PRN

## 2019-04-18 MED ORDER — PROPOFOL 10 MG/ML IV BOLUS
INTRAVENOUS | Status: DC | PRN
  Administered 2019-04-18: 100 mg via INTRAVENOUS

## 2019-04-18 MED ORDER — LIDOCAINE 2% (20 MG/ML) 5 ML SYRINGE
INTRAMUSCULAR | Status: AC
  Filled 2019-04-18: qty 10

## 2019-04-18 MED ORDER — ACETAMINOPHEN 650 MG RE SUPP: 650.0000 mg | RECTAL | Status: DC | PRN

## 2019-04-18 MED ORDER — MIDAZOLAM HCL 5 MG/5ML IJ SOLN
INTRAMUSCULAR | Status: DC | PRN
  Administered 2019-04-18: 2 mg via INTRAVENOUS

## 2019-04-18 MED ORDER — BACITRACIN ZINC 500 UNIT/GM EX OINT
TOPICAL_OINTMENT | CUTANEOUS | Status: DC | PRN
  Administered 2019-04-18: 1 via TOPICAL

## 2019-04-18 MED ORDER — CHLORHEXIDINE GLUCONATE CLOTH 2 % EX PADS: 6.0000 | MEDICATED_PAD | Freq: Once | CUTANEOUS | Status: DC

## 2019-04-18 MED ORDER — MECLIZINE HCL 25 MG PO TABS: 25.0000 mg | ORAL_TABLET | Freq: Three times a day (TID) | ORAL | Status: DC | PRN

## 2019-04-18 MED ORDER — FERROUS SULFATE 325 (65 FE) MG PO TABS
325.0000 mg | ORAL_TABLET | Freq: Every day | ORAL | Status: DC
  Administered 2019-04-19: 325 mg via ORAL
  Filled 2019-04-18: qty 1

## 2019-04-18 MED ORDER — LACTATED RINGERS IV SOLN
INTRAVENOUS | Status: DC
  Administered 2019-04-18: 23:00:00 via INTRAVENOUS

## 2019-04-18 MED ORDER — SODIUM CHLORIDE 0.9 % IV SOLN
INTRAVENOUS | Status: DC | PRN
  Administered 2019-04-18: 500 mL

## 2019-04-18 MED ORDER — FLUTICASONE PROPIONATE 50 MCG/ACT NA SUSP
2.0000 | Freq: Every day | NASAL | Status: DC | PRN
  Filled 2019-04-18: qty 16

## 2019-04-18 MED ORDER — GABAPENTIN 300 MG PO CAPS
600.0000 mg | ORAL_CAPSULE | Freq: Every day | ORAL | Status: DC
  Administered 2019-04-18: 600 mg via ORAL
  Filled 2019-04-18: qty 2

## 2019-04-18 MED ORDER — OXYCODONE HCL 5 MG PO TABS: 10.0000 mg | ORAL_TABLET | ORAL | Status: DC | PRN

## 2019-04-18 MED ORDER — DULOXETINE HCL 60 MG PO CPEP
60.0000 mg | ORAL_CAPSULE | Freq: Every day | ORAL | Status: DC
  Administered 2019-04-18 – 2019-04-19 (×2): 60 mg via ORAL
  Filled 2019-04-18 (×2): qty 1

## 2019-04-18 MED ORDER — PANTOPRAZOLE SODIUM 40 MG PO TBEC: 40.0000 mg | DELAYED_RELEASE_TABLET | Freq: Every day | ORAL | Status: DC

## 2019-04-18 MED ORDER — PHENYLEPHRINE HCL (PRESSORS) 10 MG/ML IV SOLN
INTRAVENOUS | Status: DC | PRN
  Administered 2019-04-18: 80 ug via INTRAVENOUS
  Administered 2019-04-18: 120 ug via INTRAVENOUS

## 2019-04-18 MED ORDER — ROCURONIUM BROMIDE 10 MG/ML (PF) SYRINGE
PREFILLED_SYRINGE | INTRAVENOUS | Status: DC | PRN
  Administered 2019-04-18: 50 mg via INTRAVENOUS

## 2019-04-18 MED ORDER — VANCOMYCIN HCL IN DEXTROSE 1-5 GM/200ML-% IV SOLN
1000.0000 mg | INTRAVENOUS | Status: AC
  Administered 2019-04-18: 1000 mg via INTRAVENOUS

## 2019-04-18 SURGICAL SUPPLY — 62 items
BAG DECANTER FOR FLEXI CONT (MISCELLANEOUS) ×2 IMPLANT
BENZOIN TINCTURE PRP APPL 2/3 (GAUZE/BANDAGES/DRESSINGS) ×2 IMPLANT
BIT DRILL NEURO 2X3.1 SFT TUCH (MISCELLANEOUS) ×1 IMPLANT
BLADE ULTRA TIP 2M (BLADE) ×2 IMPLANT
BUR BARREL STRAIGHT FLUTE 4.0 (BURR) ×2 IMPLANT
BUR MATCHSTICK NEURO 3.0 LAGG (BURR) ×2 IMPLANT
CANISTER SUCT 3000ML PPV (MISCELLANEOUS) ×2 IMPLANT
CARTRIDGE OIL MAESTRO DRILL (MISCELLANEOUS) ×1 IMPLANT
CLIP VESOCCLUDE MED 6/CT (CLIP) ×2 IMPLANT
COVER MAYO STAND STRL (DRAPES) ×2 IMPLANT
COVER WAND RF STERILE (DRAPES) IMPLANT
DECANTER SPIKE VIAL GLASS SM (MISCELLANEOUS) ×2 IMPLANT
DIFFUSER DRILL AIR PNEUMATIC (MISCELLANEOUS) ×2 IMPLANT
DRAPE LAPAROTOMY 100X72 PEDS (DRAPES) ×2 IMPLANT
DRAPE POUCH INSTRU U-SHP 10X18 (DRAPES) ×2 IMPLANT
DRAPE SURG 17X23 STRL (DRAPES) ×4 IMPLANT
DRILL NEURO 2X3.1 SOFT TOUCH (MISCELLANEOUS) ×2
DRSG OPSITE POSTOP 4X6 (GAUZE/BANDAGES/DRESSINGS) ×2 IMPLANT
ELECT REM PT RETURN 9FT ADLT (ELECTROSURGICAL) ×2
ELECTRODE REM PT RTRN 9FT ADLT (ELECTROSURGICAL) ×1 IMPLANT
GAUZE 4X4 16PLY RFD (DISPOSABLE) IMPLANT
GAUZE SPONGE 4X4 12PLY STRL (GAUZE/BANDAGES/DRESSINGS) IMPLANT
GLOVE BIO SURGEON STRL SZ8 (GLOVE) ×2 IMPLANT
GLOVE BIO SURGEON STRL SZ8.5 (GLOVE) ×2 IMPLANT
GLOVE BIOGEL PI IND STRL 7.0 (GLOVE) ×2 IMPLANT
GLOVE BIOGEL PI IND STRL 7.5 (GLOVE) ×3 IMPLANT
GLOVE BIOGEL PI IND STRL 8 (GLOVE) ×2 IMPLANT
GLOVE BIOGEL PI INDICATOR 7.0 (GLOVE) ×2
GLOVE BIOGEL PI INDICATOR 7.5 (GLOVE) ×3
GLOVE BIOGEL PI INDICATOR 8 (GLOVE) ×2
GLOVE ECLIPSE 7.5 STRL STRAW (GLOVE) ×4 IMPLANT
GLOVE EXAM NITRILE XL STR (GLOVE) ×6 IMPLANT
GLOVE SURG SS PI 7.0 STRL IVOR (GLOVE) ×4 IMPLANT
GOWN STRL REUS W/ TWL LRG LVL3 (GOWN DISPOSABLE) ×2 IMPLANT
GOWN STRL REUS W/ TWL XL LVL3 (GOWN DISPOSABLE) ×2 IMPLANT
GOWN STRL REUS W/TWL 2XL LVL3 (GOWN DISPOSABLE) ×2 IMPLANT
GOWN STRL REUS W/TWL LRG LVL3 (GOWN DISPOSABLE) ×2
GOWN STRL REUS W/TWL XL LVL3 (GOWN DISPOSABLE) ×2
HEMOSTAT POWDER KIT SURGIFOAM (HEMOSTASIS) ×4 IMPLANT
KIT BASIN OR (CUSTOM PROCEDURE TRAY) ×2 IMPLANT
KIT TURNOVER KIT B (KITS) ×2 IMPLANT
MARKER SKIN DUAL TIP RULER LAB (MISCELLANEOUS) ×2 IMPLANT
NEEDLE HYPO 22GX1.5 SAFETY (NEEDLE) ×2 IMPLANT
NEEDLE SPNL 18GX3.5 QUINCKE PK (NEEDLE) ×2 IMPLANT
NS IRRIG 1000ML POUR BTL (IV SOLUTION) ×2 IMPLANT
OIL CARTRIDGE MAESTRO DRILL (MISCELLANEOUS) ×2
PACK LAMINECTOMY NEURO (CUSTOM PROCEDURE TRAY) ×2 IMPLANT
PEEK S VISTA 7X11X14 (Peek) ×2 IMPLANT
PIN DISTRACTION 14MM (PIN) ×4 IMPLANT
PLATE ANT CERV XTEND ELD 1 L14 (Plate) ×2 IMPLANT
PUTTY DBM 2CC CALC GRAN (Putty) ×2 IMPLANT
SCREW VAR 4.2 XD SELF DRILL 12 (Screw) ×2 IMPLANT
SCREW XTD VAR 4.2 SELF TAP 12 (Screw) ×6 IMPLANT
SPONGE INTESTINAL PEANUT (DISPOSABLE) ×2 IMPLANT
SPONGE SURGIFOAM ABS GEL SZ50 (HEMOSTASIS) IMPLANT
STRIP CLOSURE SKIN 1/2X4 (GAUZE/BANDAGES/DRESSINGS) ×2 IMPLANT
SUT VIC AB 0 CT1 27 (SUTURE) ×1
SUT VIC AB 0 CT1 27XBRD ANTBC (SUTURE) ×1 IMPLANT
SUT VIC AB 3-0 SH 8-18 (SUTURE) ×2 IMPLANT
TOWEL GREEN STERILE (TOWEL DISPOSABLE) ×2 IMPLANT
TOWEL GREEN STERILE FF (TOWEL DISPOSABLE) ×2 IMPLANT
WATER STERILE IRR 1000ML POUR (IV SOLUTION) ×2 IMPLANT

## 2019-04-18 NOTE — Anesthesia Postprocedure Evaluation (Signed)
Anesthesia Post Note  Patient: [name]  Procedure(s) Performed: ANTERIOR CERVICAL DECOMPRESSION/DISCECTOMY  CERVICAL SIX- CERVICAL SEVEN (N/A Spine Cervical)     Patient location during evaluation: PACU Anesthesia Type: General Level of consciousness: awake and alert Pain management: pain level controlled Vital Signs Assessment: post-procedure vital signs reviewed and stable Respiratory status: spontaneous breathing, nonlabored ventilation and respiratory function stable Cardiovascular status: blood pressure returned to baseline and stable Postop Assessment: no apparent nausea or vomiting Anesthetic complications: no    Last Vitals:  Vitals:   04/18/19 1947 04/18/19 2008  BP: (!) 150/75 140/76  Pulse: (!) 101   Resp: (!) 24 12  Temp:  37.2 C  SpO2: 92% 95%    Last Pain:  Vitals:   04/18/19 2008  TempSrc: Oral  PainSc: 0-No pain                 Dorisann Schwanke,W. EDMOND

## 2019-04-18 NOTE — Op Note (Signed)
Brief history: The patient is a 68 year old white female on whom I previously performed an anterior cervical discectomy, fusion and plating.  She has done well until recently when she developed recurrent neck and right arm pain.  She failed medical management.  She was worked up with a cervical MRI which demonstrated spondylosis and neuroforaminal stenosis at C6-7.  I discussed the various treatment options with the patient.  She has weighed the risks, benefits and alternatives to surgery and decided proceed with a C6-7 anterior cervical discectomy, fusion and plating with exploration of her old fusion.  Preoperative diagnosis: C6-7 disc degeneration, spondylosis, neuroforaminal stenosis, cervicalgia, cervical radiculopathy  Postoperative diagnosis: The same  Procedure: C6-7 anterior cervical discectomy/decompression; C6-7 interbody arthrodesis with local morcellized autograft bone and Zimmer DBM; insertion of interbody prosthesis at C6-7 (Zimmer peek interbody prosthesis); anterior cervical plating from C6-7 with globus titanium plate; exploration of cervical fusion/removal of cervical plate  Surgeon: Dr. Earle Gell  Asst.: Arnetha Massy nurse practitioner  Anesthesia: Gen. endotracheal  Estimated blood loss: 75 cc  Drains: None  Complications: None  Description of procedure: The patient was brought to the operating room by the anesthesia team. General endotracheal anesthesia was induced. A roll was placed under the patient's shoulders to keep the neck in the neutral position. The patient's anterior cervical region was then prepared with Betadine scrub and Betadine solution. Sterile drapes were applied.  The area to be incised was then injected with Marcaine with epinephrine solution. I then used a scalpel to make a transverse incision in the patient's left anterior neck, incising through her old surgical scar. I used the Metzenbaum scissors to dissect through the scar tissue and to divide  the platysmal muscle and then to dissect medial to the sternocleidomastoid muscle, jugular vein, and carotid artery. I carefully dissected down towards the anterior cervical spine identifying the esophagus and retracting it medially. Then using Kitner swabs to clear soft tissue from the anterior cervical spine, exposing the old plate.  We explored the fusion by removing the old plate and screws from C3-C6.  I inspected the arthrodesis.  It appeared solid.   I then used electrocautery to detach the medial border of the longus colli muscle bilaterally from the C6-7 intervertebral disc spaces. I then inserted the Caspar self-retaining retractor underneath the longus colli muscle bilaterally to provide exposure.  We then incised the intervertebral disc at C6 and C7. We then performed a partial intervertebral discectomy with a pituitary forceps and the Karlin curettes. I then inserted distraction screws into the vertebral bodies at C6-7. We then distracted the interspace. We then used the high-speed drill to decorticate the vertebral endplates at [id number], to drill away the remainder of the intervertebral disc, to drill away some posterior spondylosis, and to thin out the posterior longitudinal ligament. I then incised ligament with the arachnoid knife. We then removed the ligament with a Kerrison punches undercutting the vertebral endplates and decompressing the thecal sac. We then performed foraminotomies about the bilateral C7 nerve roots. This completed the decompression at this level.  We now turned our to attention to the interbody fusion. We used the trial spacers to determine the appropriate size for the interbody prosthesis. We then pre-filled prosthesis with a combination of local morcellized autograft bone that we obtained during decompression as well as Zimmer DBM. We then inserted the prosthesis into the distracted interspace at C6-7. We then removed the distraction screws. There was a good snug fit of  the prosthesis in the  interspace.  Having completed the fusion we now turned attention to the anterior spinal instrumentation. We used the high-speed drill to drill away some anterior spondylosis at the disc spaces so that the plate lay down flat. We selected the appropriate length titanium anterior cervical plate. We laid it along the anterior aspect of the vertebral bodies from C6-7. We then drilled 12 mm holes at C6 and C7. We then secured the plate to the vertebral bodies by placing two 12 mm self-tapping screws at C6 and C7. We then obtained intraoperative radiograph.  We could not see the plate and screws because of the patient's body habitus.  But the instrumentation looked good in vivo.  We therefore secured the screws the plate the locking each cam. This completed the instrumentation.  We then obtained hemostasis using bipolar electrocautery. We irrigated the wound out with bacitracin solution. We then removed the retractor. We inspected the esophagus for any damage. There was none apparent. We then reapproximated patient's platysmal muscle with interrupted 3-0 Vicryl suture. We then reapproximated the subcutaneous tissue with interrupted 3-0 Vicryl suture. The skin was reapproximated with Steri-Strips and benzoin. The wound was then covered with bacitracin ointment. A sterile dressing was applied. The drapes were removed. Patient was subsequently extubated by the anesthesia team and transported to the post anesthesia care unit in stable condition. All sponge instrument and needle counts were reportedly correct at the end of this case.

## 2019-04-18 NOTE — Anesthesia Procedure Notes (Signed)
Procedure Name: Intubation Date/Time: 04/18/2019 4:20 PM Performed by: Kyung Rudd, CRNA Pre-anesthesia Checklist: Patient identified, Emergency Drugs available, Suction available, Patient being monitored and Timeout performed Patient Re-evaluated:Patient Re-evaluated prior to induction Oxygen Delivery Method: Circle system utilized Preoxygenation: Pre-oxygenation with 100% oxygen Induction Type: IV induction Ventilation: Mask ventilation without difficulty and Oral airway inserted - appropriate to patient size Laryngoscope Size: Glidescope and 4 Tube type: Oral Tube size: 7.0 mm Number of attempts: 1 Airway Equipment and Method: Stylet and Video-laryngoscopy Placement Confirmation: ETT inserted through vocal cords under direct vision,  positive ETCO2 and breath sounds checked- equal and bilateral Secured at: 20 cm Tube secured with: Tape Dental Injury: Teeth and Oropharynx as per pre-operative assessment  Comments: Glidescope used to maintain neutral cervical alignment. AOI. Grade I view on Glidescope screen.

## 2019-04-18 NOTE — Progress Notes (Signed)
Subjective: The patient is alert and pleasant.  She is in no apparent distress.  She looks well.  Objective: Vital signs in last 24 hours: Temp:  [98.2 F (36.8 C)] 98.2 F (36.8 C) (09/10 1111) Pulse Rate:  [102] 102 (09/10 1111) Resp:  [18] 18 (09/10 1111) BP: (189)/(84) 189/84 (09/10 1111) SpO2:  [99 %] 99 % (09/10 1111) Weight:  [90.7 kg] 90.7 kg (09/10 1114) Estimated body mass index is 34.87 kg/m as calculated from the following:   Height as of this encounter: 5' 3.5" (1.613 m).   Weight as of this encounter: 90.7 kg.   Intake/Output from previous day: No intake/output data recorded. Intake/Output this shift: Total I/O In: 900 [I.V.:700; IV Piggyback:200] Out: 100 [Blood:100]  Physical exam the patient is alert and pleasant.  She is moving all 4 extremities well.  The patient's dressing is clean and dry without hematoma or shift.  Lab Results: No results for input(s): WBC, HGB, HCT, PLT in the last 72 hours. BMET No results for input(s): NA, K, CL, CO2, GLUCOSE, BUN, CREATININE, CALCIUM in the last 72 hours.  Studies/Results: No results found.  Assessment/Plan: The patient is doing well.  I spoke with her husband.  LOS: 0 days     [name] 04/18/2019, 6:54 PM

## 2019-04-18 NOTE — H&P (Signed)
Subjective: The patient is a 68 year old white female on whom I performed a previous anterior cervical discectomy, fusion and plating.  She has developed recurrent right neck and arm pain consistent with a cervical radiculopathy.  She has failed medical management.  She was worked up with a cervical MRI which demonstrated spondylosis and stenosis at C6-7.  I discussed the various treatment options with her.  She has decided to proceed with surgery.  Past Medical History:  Diagnosis Date  . Anemia    iron deficiency  . Arthritis   . Blood transfusion   . Cervical spondylosis with myelopathy and radiculopathy   . CVA (cerebral vascular accident) ([place])    11/2017  . Depression   . Diabetes mellitus    Type II  . Diastolic dysfunction   . Diverticulitis   . DJD (degenerative joint disease)   . DVT (deep venous thrombosis) ([facility])    times 2 lower leg  . Endometriosis   . Family history of adverse reaction to anesthesia    mom and dad PONV  . Fibromyalgia   . GERD (gastroesophageal reflux disease)    occ  . Hiatal hernia   . History of kidney stones   . Hyperlipidemia   . Hypertension   . Hypothyroidism   . Insomnia   . Mild aortic insufficiency    by echo 04/2013  . Osteoarthritis    back and knee  . Ovarian cyst   . PONV (postoperative nausea and vomiting)   . RLS (restless legs syndrome)   . Thoracic compression fracture ([place])   . Uterine fibroid   . UTI (lower urinary tract infection)   . Villous adenoma of right colon 04/08/2016  . Vitamin D deficiency disease     Past Surgical History:  Procedure Laterality Date  . ANTERIOR CERVICAL DECOMP/DISCECTOMY FUSION N/A 03/23/2017   Procedure: ANTERIOR CERVICAL DECOMPRESSION/DISCECTOMY FUSION, INTERBODY PROSTHESIS,PLATE CERVICAL THREE- CERVICAL FOUR, CERVICAL FOUR- CERVICAL FIVE, CERVICAL FIVE- CERVICAL SIX;  Surgeon: Newman Pies, MD;  Location: [facility];  Service: Neurosurgery;  Laterality: N/A;  . APPENDECTOMY    . BACK  SURGERY  ,2005   April  2013 - spinal fusion[facility]  . BREAST SURGERY     breast reduction  . CARDIAC CATHETERIZATION  04/2013   normal coronary arteries and normal LVF  . CARPAL TUNNEL RELEASE  06/05/2012   Procedure: CARPAL TUNNEL RELEASE;  Surgeon: Cammie Sickle., MD;  Location: [facility];  Service: Orthopedics;  Laterality: Left;  . CESAREAN SECTION     x 2  . CHOLECYSTECTOMY    . COLECTOMY  04/08/2016  . DILATION AND CURETTAGE OF UTERUS    . DORSAL COMPARTMENT RELEASE  06/05/2012   Procedure: RELEASE DORSAL COMPARTMENT (DE[place]ERVAIN);  Surgeon: Cammie Sickle., MD;  Location: [facility];  Service: Orthopedics;  Laterality: Left;  Excision of mixoid cyst also  . EYE SURGERY     cataracts bilateral  . JOINT REPLACEMENT     Bilateral knee  . KNEE ARTHROPLASTY  09   lft partial  . KNEE ARTHROPLASTY     rt  . LAPAROSCOPIC PARTIAL COLECTOMY N/A 04/08/2016   Procedure: LAPAROSCOPIC ASSISTED ASCENDING COLECTOMY POSSIBLE OPEN COLECTOMY;  Surgeon: Fanny Skates, MD;  Location: [facility];  Service: General;  Laterality: N/A;  . orthopedic surgeries     multiple  . REDUCTION MAMMAPLASTY Bilateral   . SHOULDER OPEN ROTATOR CUFF REPAIR     rt and lft  . TEE  WITHOUT CARDIOVERSION N/A 11/23/2017   Procedure: TRANSESOPHAGEAL ECHOCARDIOGRAM (TEE);  Surgeon: Jerline Pain, MD;  Location: [facility] ENDOSCOPY;  Service: Cardiovascular;  Laterality: N/A;  . TUBAL LIGATION     btsp    Allergies  Allergen Reactions  . Codeine Nausea And Vomiting  . Ambien [Zolpidem Tartrate] Other (See Comments)    Up sleep walking and eating  . Crestor [Rosuvastatin] Other (See Comments)    Muscle weakness, cramps and aching all over body  . Lipitor [Atorvastatin] Itching and Other (See Comments)    Muscle weakness, cramps and aches all over body  . Morphine And Related Other (See Comments)    Flushing and feeling hot Tolerates with Diphenhydramine  . Penicillins Rash and Other  (See Comments)    Caused Headaches also Has patient had a PCN reaction causing immediate rash, facial/tongue/throat swelling, SOB or lightheadedness with hypotension: No Has patient had a PCN reaction causing severe rash involving mucus membranes or skin necrosis: No Has patient had a PCN reaction that required hospitalization No Has patient had a PCN reaction occurring within the last 10 years: No If all of the above answers are "NO", then may proceed with Cephalosporin use.    . Statins Other (See Comments)    Muscle weakness, cramps and aching all over body  . Dilaudid [Hydromorphone Hcl] Other (See Comments)    Hallucinations/ argumentative, goes beserk  . Heparin Other (See Comments)    HIT ab positive  (SRA negative 11/25/17)   . Amitriptyline Other (See Comments)    Loopy feeling  . Ceftin [Cefuroxime Axetil] Other (See Comments)    Stomach pain   . Lovaza [Omega-3-Acid Ethyl Esters] Other (See Comments)    Stomach pain   . Lunesta [Eszopiclone] Other (See Comments)    Causes dizziness  . Lyrica [Pregabalin] Nausea Only  . Metformin And Related Nausea Only    Social History   Tobacco Use  . Smoking status: Never Smoker  . Smokeless tobacco: Never Used  Substance Use Topics  . Alcohol use: No    Family History  Problem Relation Age of Onset  . Heart attack Father        30s  . Hypothyroidism Father   . Diabetes type II Father   . Diabetes type II Mother   . Hypertension Mother   . Breast cancer Mother 64  . Hypothyroidism Brother   . Breast cancer Maternal Aunt   . Breast cancer Cousin   . Breast cancer Maternal Aunt   . Breast cancer Cousin    Prior to Admission medications   Medication Sig Start Date End Date Taking? Authorizing Provider  acetaminophen (TYLENOL) 500 MG tablet Take 1,000 mg by mouth every 8 (eight) hours as needed for mild pain or headache.   Yes [provider]  amLODipine (NORVASC) 2.5 MG tablet Take 5 mg by mouth daily.  10/27/18   Yes [provider]  aspirin EC 81 MG tablet Take 81 mg by mouth daily.   Yes [provider]  clobetasol (TEMOVATE) 0.05 % external solution Apply 1 application topically 2 (two) times daily as needed (rash).  07/13/16  Yes [provider]  Cyanocobalamin (B-12) 2500 MCG TABS Take 2,500 mcg by mouth daily.   Yes [provider]  cyclobenzaprine (FLEXERIL) 10 MG tablet Take 1 tablet (10 mg total) by mouth 3 (three) times daily as needed for muscle spasms. 03/24/17  Yes Costella, Vista Mink, PA-C  docusate sodium (COLACE) 100 MG capsule Take  1 capsule (100 mg total) by mouth 2 (two) times daily. Patient taking differently: Take 100 mg by mouth daily.  11/17/17  Yes Newman Pies, MD  DULoxetine (CYMBALTA) 60 MG capsule Take 60 mg by mouth daily.    Yes [provider]  ferrous sulfate 325 (65 FE) MG tablet Take 1 tablet (325 mg total) by mouth 2 (two) times daily with a meal. Patient taking differently: Take 325 mg by mouth daily with breakfast.  11/17/17  Yes Newman Pies, MD  fluticasone [facility]) 50 MCG/ACT nasal spray Place 2 sprays into both nostrils daily as needed for allergies or rhinitis.   Yes [provider]  furosemide (LASIX) 20 MG tablet Take 20 mg by mouth daily as needed for fluid or edema.    Yes [provider]  gabapentin (NEURONTIN) 300 MG capsule Take 600 mg by mouth at bedtime.  03/30/18  Yes [provider]  insulin glargine (LANTUS) 100 UNIT/ML injection Inject 40 Units into the skin daily. Takes in am   Yes [provider]  insulin lispro (HUMALOG KWIKPEN) 100 UNIT/ML KwikPen Inject 14 Units into the skin 3 (three) times daily.   Yes [provider]  irbesartan (AVAPRO) 300 MG tablet Take 300 mg by mouth daily.  11/15/18  Yes [provider]  levothyroxine (SYNTHROID, LEVOTHROID) 150 MCG tablet Take 150 mcg by mouth daily before breakfast. 10/10/17  Yes [provider]   LORazepam (ATIVAN) 0.5 MG tablet Take 1 tablet (0.5 mg total) by mouth every 8 (eight) hours as needed for anxiety. 05/17/18 05/17/19 Yes Shah, Pratik D, DO  meclizine (ANTIVERT) 12.5 MG tablet Take 25 mg by mouth 3 (three) times daily as needed for dizziness.   Yes [provider]  oxyCODONE-acetaminophen (PERCOCET/ROXICET) 5-325 MG tablet Take 1 tablet by mouth every 6 (six) hours as needed (pain).  10/26/18  Yes [provider]  Pitavastatin Calcium (LIVALO) 2 MG TABS Take 2 mg by mouth every Wednesday.   Yes [provider]  Probiotic Product (PROBIOTIC PO) Take 1 capsule by mouth daily.   Yes [provider]  promethazine (PHENERGAN) 25 MG tablet Take 25 mg by mouth every 8 (eight) hours as needed for nausea or vomiting.   Yes [provider]  temazepam (RESTORIL) 15 MG capsule Take 15 mg by mouth at bedtime.  10/10/17  Yes [provider]  dexlansoprazole (DEXILANT) 60 MG capsule Take 60 mg by mouth daily.    [provider]  ibandronate (BONIVA) 150 MG tablet Take 150 mg by mouth every 30 (thirty) days. 03/06/19   [provider]  ibuprofen (ADVIL) 200 MG tablet Take 400 mg by mouth every 6 (six) hours as needed for headache (pain).    [provider]  oxyCODONE (OXY IR/ROXICODONE) 5 MG immediate release tablet Take 1 tablet (5 mg total) by mouth every 4 (four) hours as needed for moderate pain ((score 4 to 6)). Patient not taking: Reported on 12/15/2018 11/17/17   Newman Pies, MD     Review of Systems  Positive ROS: As above  All other systems have been reviewed and were otherwise negative with the exception of those mentioned in the HPI and as above.  Objective: Vital signs in last 24 hours: Temp:  [98.2 F (36.8 C)] 98.2 F (36.8 C) (09/10 1111) Pulse Rate:  [102] 102 (09/10 1111) Resp:  [18] 18 (09/10 1111) BP: (189)/(84) 189/84 (09/10 1111) SpO2:  [99 %] 99 % (09/10 1111) Weight:  [90.7  kg] 90.7  kg (09/10 1114) Estimated body mass index is 34.87 kg/m as calculated from the following:   Height as of this encounter: 5' 3.5" (1.613 m).   Weight as of this encounter: 90.7 kg.   General Appearance: Alert Head: Normocephalic, without obvious abnormality, atraumatic Eyes: PERRL, conjunctiva/corneas clear, EOM's intact,    Ears: Normal  Throat: Normal  Neck: Limited range of motion, her anterior cervical incision is well-healed. Back: unremarkable Lungs: Clear to auscultation bilaterally, respirations unlabored Heart: Regular rate and rhythm, no murmur, rub or gallop Abdomen: Soft, non-tender Extremities: Extremities normal, atraumatic, no cyanosis or edema Skin: unremarkable  NEUROLOGIC:   Mental status: alert and oriented,Motor Exam - grossly normal Sensory Exam - grossly normal Reflexes:  Coordination - grossly normal Gait - grossly normal Balance - grossly normal Cranial Nerves: I: smell Not tested  II: visual acuity  OS: Normal  OD: Normal   II: visual fields Full to confrontation  II: pupils Equal, round, reactive to light  III,VII: ptosis None  III,IV,VI: extraocular muscles  Full ROM  V: mastication Normal  V: facial light touch sensation  Normal  V,VII: corneal reflex  Present  VII: facial muscle function - upper  Normal  VII: facial muscle function - lower Normal  VIII: hearing Not tested  IX: soft palate elevation  Normal  IX,X: gag reflex Present  XI: trapezius strength  5/5  XI: sternocleidomastoid strength 5/5  XI: neck flexion strength  5/5  XII: tongue strength  Normal    Data Review Lab Results  Component Value Date   WBC 7.0 04/10/2019   HGB 14.2 04/10/2019   HCT 44.3 04/10/2019   MCV 86.7 04/10/2019   PLT 264 04/10/2019   Lab Results  Component Value Date   NA 137 04/10/2019   K 4.8 04/10/2019   CL 102 04/10/2019   CO2 25 04/10/2019   BUN 13 04/10/2019   CREATININE 0.88 04/10/2019   GLUCOSE 217 (H) 04/10/2019   Lab Results   Component Value Date   INR 1.09 11/23/2017    Assessment/Plan: C6-7 disc degeneration, spondylosis, stenosis, cervicalgia, cervical radiculopathy: I have discussed the situation with the patient.  We have reviewed her MRI scan with her.  I have discussed the various treatment options including surgery.  We have described a C6-7 anterior cervical discectomy, fusion and plating with possible removal of her old plate and screws.  We have shown her surgical models.  We have given her a surgical pamphlet.  We have discussed the risks, benefits, alternatives, expected postop course, and likelihood of achieving our goals with surgery.  I have answered all her questions.  She has decided to proceed with surgery.   [name] 04/18/2019 4:06 PM

## 2019-04-18 NOTE — Transfer of Care (Signed)
Immediate Anesthesia Transfer of Care Note  Patient: [name]  Procedure(s) Performed: ANTERIOR CERVICAL DECOMPRESSION/DISCECTOMY  CERVICAL SIX- CERVICAL SEVEN (N/A Spine Cervical)  Patient Location: PACU  Anesthesia Type:General  Level of Consciousness: awake, alert , oriented and patient cooperative  Airway & Oxygen Therapy: Patient Spontanous Breathing and Patient connected to nasal cannula oxygen  Post-op Assessment: Report given to RN and Post -op Vital signs reviewed and stable  Post vital signs: Reviewed and stable  Last Vitals:  Vitals Value Taken Time  BP    Temp    Pulse    Resp    SpO2      Last Pain:  Vitals:   04/18/19 1209  TempSrc:   PainSc: 0-No pain         Complications: No apparent anesthesia complications

## 2019-04-18 NOTE — Progress Notes (Signed)
Inpatient Diabetes Program Recommendations  AACE/ADA: New Consensus Statement on Inpatient Glycemic Control (2015)  Target Ranges:  Prepandial:   less than 140 mg/dL      Peak postprandial:   less than 180 mg/dL (1-2 hours)      Critically ill patients:  140 - 180 mg/dL   Lab Results  Component Value Date   GLUCAP 157 (H) 04/18/2019   HGBA1C 9.2 (H) 04/10/2019    Review of Glycemic Control  Diabetes history: DM 2 Outpatient Diabetes medications: Tresiba 40 units Daily, Humalog 14 units tid Current orders for Inpatient glycemic control: In OR area  Inpatient Diabetes Program Recommendations:    Hx DKA after surgery 11/2017. Chart reviewed. DKA precipitated by pt home medication Jardiance (taking invokana in hospital) an SGLT2 inhibitor that can cause DKA (she is no longer prescribed this) in addition to pt did not receive her basal insulin and also patient received Decadron 10 mg.  Recommend  Lantus 20 units bid starting tonight Novolog 0-15 units tid Novolog 0-5 units qhs  We will follow glucose trends while here.  Thanks,  Tama Headings RN, MSN, BC-ADM Inpatient Diabetes Coordinator Team Pager [phone] (8a-5p)

## 2019-04-18 NOTE — Progress Notes (Signed)
Pharmacy Antibiotic Note  [name] is a 68 y.o. female admitted on 04/18/2019 for planned spinal surgery. Pharmacy has been consulted for Vancomycin post-op dosing.  Per discussion with RN the patient does NOT have a drain in place. Vancomycin 1g was given pre-op at 1545. SCr 0.88, CrCl~60-70 ml/min.   Plan: - Vancomycin 1g x 1 dose post-op on 9/11 @ 0400 - Pharmacy will sign off as no additional doses required at this time  Height: 5' 3.5" (161.3 cm) Weight: 200 lb (90.7 kg) IBW/kg (Calculated) : 53.55  Temp (24hrs), Avg:98.1 F (36.7 C), Min:97 F (36.1 C), Max:99 F (37.2 C)  No results for input(s): WBC, CREATININE, LATICACIDVEN, VANCOTROUGH, VANCOPEAK, VANCORANDOM, GENTTROUGH, GENTPEAK, GENTRANDOM, TOBRATROUGH, TOBRAPEAK, TOBRARND, AMIKACINPEAK, AMIKACINTROU, AMIKACIN in the last 168 hours.  Estimated Creatinine Clearance: 66.1 mL/min (by C-G formula based on SCr of 0.88 mg/dL).    Allergies  Allergen Reactions  . Codeine Nausea And Vomiting  . Ambien [Zolpidem Tartrate] Other (See Comments)    Up sleep walking and eating  . Crestor [Rosuvastatin] Other (See Comments)    Muscle weakness, cramps and aching all over body  . Lipitor [Atorvastatin] Itching and Other (See Comments)    Muscle weakness, cramps and aches all over body  . Morphine And Related Other (See Comments)    Flushing and feeling hot Tolerates with Diphenhydramine  . Penicillins Rash and Other (See Comments)    Caused Headaches also Has patient had a PCN reaction causing immediate rash, facial/tongue/throat swelling, SOB or lightheadedness with hypotension: No Has patient had a PCN reaction causing severe rash involving mucus membranes or skin necrosis: No Has patient had a PCN reaction that required hospitalization No Has patient had a PCN reaction occurring within the last 10 years: No If all of the above answers are "NO", then may proceed with Cephalosporin use.    . Statins Other (See Comments)     Muscle weakness, cramps and aching all over body  . Dilaudid [Hydromorphone Hcl] Other (See Comments)    Hallucinations/ argumentative, goes beserk  . Heparin Other (See Comments)    HIT ab positive  (SRA negative 11/25/17)   . Amitriptyline Other (See Comments)    Loopy feeling  . Ceftin [Cefuroxime Axetil] Other (See Comments)    Stomach pain   . Lovaza [Omega-3-Acid Ethyl Esters] Other (See Comments)    Stomach pain   . Lunesta [Eszopiclone] Other (See Comments)    Causes dizziness  . Lyrica [Pregabalin] Nausea Only  . Metformin And Related Nausea Only    Alycia Rossetti, PharmD, BCPS Pager: [phone] 9:17 PM

## 2019-04-19 ENCOUNTER — Encounter (HOSPITAL_COMMUNITY): Payer: Self-pay | Admitting: Neurosurgery

## 2019-04-19 DIAGNOSIS — Z86718 Personal history of other venous thrombosis and embolism: Secondary | ICD-10-CM | POA: Diagnosis not present

## 2019-04-19 DIAGNOSIS — E1136 Type 2 diabetes mellitus with diabetic cataract: Secondary | ICD-10-CM | POA: Diagnosis not present

## 2019-04-19 DIAGNOSIS — M50123 Cervical disc disorder at C6-C7 level with radiculopathy: Secondary | ICD-10-CM | POA: Diagnosis not present

## 2019-04-19 DIAGNOSIS — M4722 Other spondylosis with radiculopathy, cervical region: Secondary | ICD-10-CM | POA: Diagnosis not present

## 2019-04-19 DIAGNOSIS — Z8673 Personal history of transient ischemic attack (TIA), and cerebral infarction without residual deficits: Secondary | ICD-10-CM | POA: Diagnosis not present

## 2019-04-19 DIAGNOSIS — M4802 Spinal stenosis, cervical region: Secondary | ICD-10-CM | POA: Diagnosis not present

## 2019-04-19 LAB — GLUCOSE, CAPILLARY
Glucose-Capillary: 233 mg/dL — ABNORMAL HIGH (ref 70–99)
Glucose-Capillary: 255 mg/dL — ABNORMAL HIGH (ref 70–99)
Glucose-Capillary: 319 mg/dL — ABNORMAL HIGH (ref 70–99)

## 2019-04-19 MED ORDER — PANTOPRAZOLE SODIUM 40 MG PO TBEC: 40.0000 mg | DELAYED_RELEASE_TABLET | Freq: Every day | ORAL | Status: DC

## 2019-04-19 MED ORDER — CYCLOBENZAPRINE HCL 10 MG PO TABS: 10.0000 mg | ORAL_TABLET | Freq: Three times a day (TID) | ORAL | 0 refills | Status: DC | PRN

## 2019-04-19 MED ORDER — OXYCODONE-ACETAMINOPHEN 5-325 MG PO TABS: 1.0000 | ORAL_TABLET | ORAL | 0 refills | Status: DC | PRN

## 2019-04-19 NOTE — Evaluation (Signed)
Occupational Therapy Evaluation Patient Details Name: [name] MRN: [id number] DOB: Oct 17, 1950 Today's Date: 04/19/2019    History of Present Illness C6-7 anterior cervical discectomy/decompression; C6-7 interbody arthrodesis with local morcellized autograft bone and Zimmer DBM; insertion of interbody prosthesis at C6-7 (Zimmer peek interbody prosthesis); anterior cervical plating from C6-7 with globus titanium plate; exploration of cervical fusion/removal of cervical plate   Clinical Impression   Pt mod I with bed mobility and was able to ambulate Mod I 5 ft to [facility], Awaiting neck brace for further mobility (Pt's RN to walk with pt in hallway at that time). Pt requires min A with UB dressing and LB bathing/dressing. PTA, pt lived at [place] with her husband and was independent with ADLs, ADL mobility, home mgt and was driving. Pt's husband to assist her 24/7 as needed. All education completed and no further acute OT indicated at this time    Follow Up Recommendations  No OT follow up;Supervision - Intermittent    Equipment Recommendations  Other (comment)(reacher)    Recommendations for Other Services       Precautions / Restrictions Precautions Precautions: Cervical Precaution Comments: cervical precaution education with handout provided Required Braces or Orthoses: Other Brace Other Brace: awaiting neck brace Restrictions Weight Bearing Restrictions: No      Mobility Bed Mobility Overal bed mobility: Modified Independent             General bed mobility comments: increased time, no physical assist  Transfers Overall transfer level: Modified independent Equipment used: None Transfers: Sit to/from Stand Sit to Stand: Modified independent (Device/Increase time)         General transfer comment: pt ambulated 5 ft to [facility]    Balance Overall balance assessment: No apparent balance deficits (not formally assessed)                                          ADL either performed or assessed with clinical judgement   ADL Overall ADL's : Needs assistance/impaired                                       General ADL Comments: min A with LB bathing and dressing, min A with UB dressing     Vision Baseline Vision/History: Wears glasses Wears Glasses: Reading only Patient Visual Report: No change from baseline       Perception     Praxis      Pertinent Vitals/Pain Pain Assessment: 0-10 Pain Score: 3  Pain Location: surgical area Pain Descriptors / Indicators: Pressure Pain Intervention(s): Monitored during session;Repositioned     Hand Dominance Right   Extremity/Trunk Assessment Upper Extremity Assessment Upper Extremity Assessment: Defer to OT evaluation       Cervical / Trunk Assessment Cervical / Trunk Assessment: Normal   Communication Communication Communication: No difficulties   Cognition Arousal/Alertness: Awake/alert Behavior During Therapy: WFL for tasks assessed/performed Overall Cognitive Status: Within Functional Limits for tasks assessed                                     General Comments       Exercises     Shoulder Instructions      Home Living Family/patient expects to be  discharged to:: Private residence Living Arrangements: Spouse/significant other Available Help at Discharge: Family;Available 24 hours/day Type of Home: House Home Access: Stairs to enter   Entrance Stairs-Rails: None Home Layout: One level     Bathroom Shower/Tub: Occupational psychologist: Standard     Home Equipment: Environmental consultant - 2 wheels;Cane - single point;Bedside commode;Shower seat - built in          Prior Functioning/Environment Level of Independence: Independent        Comments: pt reports she was independent with ADLs/selfcare, home mgt, mobility and was driving        OT Problem List: Pain;Decreased activity tolerance      OT Treatment/Interventions:       OT Goals(Current goals can be found in the care plan section) Acute Rehab OT Goals Patient Stated Goal: go home today OT Goal Formulation: With patient  OT Frequency:     Barriers to D/C:    no barriers       Co-evaluation              AM-PAC OT "6 Clicks" Daily Activity     Outcome Measure Help from another person eating meals?: None Help from another person taking care of personal grooming?: None Help from another person toileting, which includes using toliet, bedpan, or urinal?: None Help from another person bathing (including washing, rinsing, drying)?: A Little Help from another person to put on and taking off regular upper body clothing?: A Little Help from another person to put on and taking off regular lower body clothing?: A Little 6 Click Score: 21   End of Session    Activity Tolerance: Patient tolerated treatment well Patient left: in bed;with call bell/phone within reach  OT Visit Diagnosis: Pain Pain - Right/Left: (cervical area) Pain - part of body: (cervical surgery area)                Time: [id number] OT Time Calculation (min): 28 min Charges:  OT Evaluation $OT Eval Low Complexity: 1 Low OT Treatments $Self Care/Home Management : 8-22 mins    Britt Bottom 04/19/2019, 11:50 AM

## 2019-04-19 NOTE — Progress Notes (Signed)
Orthopedic Tech Progress Note Patient Details:  [name] 02-11-1951 [id number] Let RN know she could order an aspen cervical collar from materials  Patient ID: [name], female   DOB: Sep 26, 1950, 68 y.o.   MRN: [id number]   [name] 04/19/2019, 11:08 AM

## 2019-04-19 NOTE — Progress Notes (Signed)
Subjective: The patient is alert and pleasant.  She looks well.  She is in no apparent distress.  Objective: Vital signs in last 24 hours: Temp:  [97 F (36.1 C)-99 F (37.2 C)] 98.2 F (36.8 C) (09/11 0300) Pulse Rate:  [97-102] 98 (09/11 0300) Resp:  [10-24] 18 (09/11 0300) BP: (131-189)/(74-84) 134/75 (09/11 0300) SpO2:  [92 %-99 %] 95 % (09/11 0300) Weight:  [90.7 kg] 90.7 kg (09/10 1114) Estimated body mass index is 34.87 kg/m as calculated from the following:   Height as of this encounter: 5' 3.5" (1.613 m).   Weight as of this encounter: 90.7 kg.   Intake/Output from previous day: 09/10 0701 - 09/11 0700 In: 1340 [P.O.:240; I.V.:700; IV Piggyback:400] Out: 400 [Urine:300; Blood:100] Intake/Output this shift: Total I/O In: 440 [P.O.:240; IV Piggyback:200] Out: 300 [Urine:300]  Physical exam the patient is alert and oriented.  She is moving all 4 extremities well.  Her dressing is clean and dry.  There is no hematoma or shift.  Lab Results: No results for input(s): WBC, HGB, HCT, PLT in the last 72 hours. BMET No results for input(s): NA, K, CL, CO2, GLUCOSE, BUN, CREATININE, CALCIUM in the last 72 hours.  Studies/Results: Dg Cervical Spine 1 View  Result Date: 04/18/2019 CLINICAL DATA:  ACDF C6-C7 EXAM: DG CERVICAL SPINE - 1 VIEW COMPARISON:  Radiograph 03/23/2017, MRI 03/19/2019, CT 02/26/2019 FINDINGS: Suboptimal imaging quality due to superimposition of the soft tissues of the shoulder. Interbody spacers are present at the C3-4 and C5-6 disc levels. There has been interval removal of the anterior fusion plate seen on comparison imaging. Laparotomy marker projects in the anterior soft tissues. IMPRESSION: Suboptimal imaging due to superimposed soft tissues. Removal of the anterior fixation plate. Electronically Signed   By: Lovena Le M.D.   On: 04/18/2019 20:16    Assessment/Plan: Postop day 1: The patient is doing well.  She will likely go home after she  mobilizes this morning.  I gave her her discharge instructions and answered all her questions.  LOS: 0 days     [name] 04/19/2019, 6:36 AM     Patient ID: [name], female   DOB: [birth year], 68 y.o.   MRN: [id number]

## 2019-04-19 NOTE — Discharge Summary (Signed)
Physician Discharge Summary  Patient ID: [name] MRN: [id number] DOB/AGE: May 22, 1951 68 y.o.  Admit date: 04/18/2019 Discharge date: 04/19/2019  Admission Diagnoses: Cervical spondylosis with radiculopathy  Discharge Diagnoses:  Active Problems:   Cervical spondylosis with radiculopathy   Discharged Condition: good  Hospital Course: Patient was admitted for observation following a C6-7 ACDF on 04/18/2019. She did well post operatively. She has ambulated in the hall with therapies. Her pain is well controlled with oral analgesics. She is ready to discharge home.   Consults: rehabilitation medicine  Significant Diagnostic Studies: radiology: Dg Cervical Spine 1 View  Result Date: 04/18/2019 CLINICAL DATA:  ACDF C6-C7 EXAM: DG CERVICAL SPINE - 1 VIEW COMPARISON:  Radiograph 03/23/2017, MRI 03/19/2019, CT 02/26/2019 FINDINGS: Suboptimal imaging quality due to superimposition of the soft tissues of the shoulder. Interbody spacers are present at the C3-4 and C5-6 disc levels. There has been interval removal of the anterior fusion plate seen on comparison imaging. Laparotomy marker projects in the anterior soft tissues. IMPRESSION: Suboptimal imaging due to superimposed soft tissues. Removal of the anterior fixation plate. Electronically Signed   By: Lovena Le M.D.   On: 04/18/2019 20:16    Treatments: surgery: C6-7 anterior cervical discectomy/decompression; C6-7 interbody arthrodesis with local morcellized autograft bone and Zimmer DBM; insertion of interbody prosthesis at C6-7 (Zimmer peek interbody prosthesis); anterior cervical plating from C6-7 with globus titanium plate; exploration of cervical fusion/removal of cervical plate  Discharge Exam: Blood pressure (!) 141/79, pulse 97, temperature 98.3 F (36.8 C), temperature source Oral, resp. rate 17, height 5' 3.5" (1.613 m), weight 90.7 kg, SpO2 94 %.   Alert and oriented x 4 PERRLA CN II-XII grossly intact MAE Incision is  clean, dry, and intact; Honeycomb in place  Disposition: Discharge disposition: 01-Home or Self Care        Allergies as of 04/19/2019      Reactions   Codeine Nausea And Vomiting   Ambien [zolpidem Tartrate] Other (See Comments)   Up sleep walking and eating   Crestor [rosuvastatin] Other (See Comments)   Muscle weakness, cramps and aching all over body   Lipitor [atorvastatin] Itching, Other (See Comments)   Muscle weakness, cramps and aches all over body   Morphine And Related Other (See Comments)   Flushing and feeling hot Tolerates with Diphenhydramine   Penicillins Rash, Other (See Comments)   Caused Headaches also Has patient had a PCN reaction causing immediate rash, facial/tongue/throat swelling, SOB or lightheadedness with hypotension: No Has patient had a PCN reaction causing severe rash involving mucus membranes or skin necrosis: No Has patient had a PCN reaction that required hospitalization No Has patient had a PCN reaction occurring within the last 10 years: No If all of the above answers are "NO", then may proceed with Cephalosporin use.   Statins Other (See Comments)   Muscle weakness, cramps and aching all over body   Dilaudid [hydromorphone Hcl] Other (See Comments)   Hallucinations/ argumentative, goes beserk   Heparin Other (See Comments)   HIT ab positive  (SRA negative 11/25/17)    Amitriptyline Other (See Comments)   Loopy feeling   Ceftin [cefuroxime Axetil] Other (See Comments)   Stomach pain    Lovaza [omega-3-acid Ethyl Esters] Other (See Comments)   Stomach pain    Lunesta [eszopiclone] Other (See Comments)   Causes dizziness   Lyrica [pregabalin] Nausea Only   Metformin And Related Nausea Only      Medication List    STOP taking  these medications   oxyCODONE 5 MG immediate release tablet Commonly known as: Oxy IR/ROXICODONE     TAKE these medications   acetaminophen 500 MG tablet Commonly known as: TYLENOL Take 1,000 mg by mouth  every 8 (eight) hours as needed for mild pain or headache.   amLODipine 2.5 MG tablet Commonly known as: NORVASC Take 5 mg by mouth daily.   aspirin EC 81 MG tablet Take 81 mg by mouth daily.   B-12 2500 MCG Tabs Take 2,500 mcg by mouth daily.   clobetasol 0.05 % external solution Commonly known as: TEMOVATE Apply 1 application topically 2 (two) times daily as needed (rash).   cyclobenzaprine 10 MG tablet Commonly known as: FLEXERIL Take 1 tablet (10 mg total) by mouth 3 (three) times daily as needed for muscle spasms.   dexlansoprazole 60 MG capsule Commonly known as: DEXILANT Take 60 mg by mouth daily.   docusate sodium 100 MG capsule Commonly known as: COLACE Take 1 capsule (100 mg total) by mouth 2 (two) times daily. What changed: when to take this   DULoxetine 60 MG capsule Commonly known as: CYMBALTA Take 60 mg by mouth daily.   ferrous sulfate 325 (65 FE) MG tablet Take 1 tablet (325 mg total) by mouth 2 (two) times daily with a meal. What changed: when to take this   fluticasone 50 MCG/ACT nasal spray Commonly known as: FLONASE Place 2 sprays into both nostrils daily as needed for allergies or rhinitis.   furosemide 20 MG tablet Commonly known as: LASIX Take 20 mg by mouth daily as needed for fluid or edema.   gabapentin 300 MG capsule Commonly known as: NEURONTIN Take 600 mg by mouth at bedtime.   HumaLOG KwikPen 100 UNIT/ML KwikPen Generic drug: insulin lispro Inject 14 Units into the skin 3 (three) times daily.   ibandronate 150 MG tablet Commonly known as: BONIVA Take 150 mg by mouth every 30 (thirty) days.   ibuprofen 200 MG tablet Commonly known as: ADVIL Take 400 mg by mouth every 6 (six) hours as needed for headache (pain).   insulin glargine 100 UNIT/ML injection Commonly known as: LANTUS Inject 40 Units into the skin daily. Takes in am   irbesartan 300 MG tablet Commonly known as: AVAPRO Take 300 mg by mouth daily.   levothyroxine  150 MCG tablet Commonly known as: SYNTHROID Take 150 mcg by mouth daily before breakfast.   Livalo 2 MG Tabs Generic drug: Pitavastatin Calcium Take 2 mg by mouth every Wednesday.   LORazepam 0.5 MG tablet Commonly known as: Ativan Take 1 tablet (0.5 mg total) by mouth every 8 (eight) hours as needed for anxiety.   meclizine 12.5 MG tablet Commonly known as: ANTIVERT Take 25 mg by mouth 3 (three) times daily as needed for dizziness.   oxyCODONE-acetaminophen 5-325 MG tablet Commonly known as: PERCOCET/ROXICET Take 1 tablet by mouth every 4 (four) hours as needed (pain). What changed: when to take this   PROBIOTIC PO Take 1 capsule by mouth daily.   promethazine 25 MG tablet Commonly known as: PHENERGAN Take 25 mg by mouth every 8 (eight) hours as needed for nausea or vomiting.   temazepam 15 MG capsule Commonly known as: RESTORIL Take 15 mg by mouth at bedtime.      Follow-up Information    Newman Pies, MD. Schedule an appointment as soon as possible for a visit in 2 week(s).   Specialty: Neurosurgery Contact information: [address] [place] [address] [place] [address]  [phone]           Signed: Patricia Nettle 04/19/2019, 12:20 PM

## 2019-04-19 NOTE — Progress Notes (Signed)
Orthopedic Tech Progress Note Patient Details:  [name] Mar 26, 1951 [id number]  Ortho Devices Type of Ortho Device: Soft collar Ortho Device/Splint Location: neck Ortho Device/Splint Interventions: Adjustment, Application, Ordered   Post Interventions Patient Tolerated: Well Instructions Provided: Care of device, Adjustment of device   [name] 04/19/2019, 12:23 PM

## 2019-04-19 NOTE — Progress Notes (Signed)
Inpatient Diabetes Program Recommendations  AACE/ADA: New Consensus Statement on Inpatient Glycemic Control (2015)  Target Ranges:  Prepandial:   less than 140 mg/dL      Peak postprandial:   less than 180 mg/dL (1-2 hours)      Critically ill patients:  140 - 180 mg/dL   Lab Results  Component Value Date   GLUCAP 255 (H) 04/19/2019   HGBA1C 9.2 (H) 04/10/2019    Review of Glycemic Control  Diabetes history: DM 2 Outpatient Diabetes medications: Tresiba 40 units Daily, Humalog 14 units tid Current orders for Inpatient glycemic control:  Novolog 0-20 units Q4 hours  A1c 9.2% on 9/2  Inpatient Diabetes Program Recommendations:    Hx DKA after surgery 11/2017. Chart reviewed. DKA precipitated by pt home medication Jardiance (taking invokana in hospital) an SGLT2 inhibitor that can cause DKA (she is no longer prescribed this) in addition to pt did not receive her basal insulin and also patient received Decadron 10 mg.  Glucose mid 200's.  Recommend  Lantus 20 units bid starting this morning   We will follow glucose trends while here.  Thanks,  Tama Headings RN, MSN, BC-ADM Inpatient Diabetes Coordinator Team Pager [phone] (8a-5p)

## 2019-04-19 NOTE — Discharge Instructions (Signed)
Remove dressing after 48 hours. Leave steri strips in place. It is ok if they fall off on their own. It is ok to shower. Do not submerge your incision in water (pool, hot tub, bath) until it is fully healed.

## 2019-04-19 NOTE — Progress Notes (Signed)
Patient given D/C instructions and all questions answered. No printed prescriptions to give or equipment to deliver. IV removed. Pt taken to car with all belongings.

## 2019-04-20 LAB — HEMOGLOBIN A1C
Hgb A1c MFr Bld: 9.3 % — ABNORMAL HIGH (ref 4.8–5.6)
Mean Plasma Glucose: 220 mg/dL

## 2019-05-13 DIAGNOSIS — Z8639 Personal history of other endocrine, nutritional and metabolic disease: Secondary | ICD-10-CM | POA: Diagnosis not present

## 2019-05-13 DIAGNOSIS — E039 Hypothyroidism, unspecified: Secondary | ICD-10-CM | POA: Diagnosis not present

## 2019-05-13 DIAGNOSIS — Z23 Encounter for immunization: Secondary | ICD-10-CM | POA: Diagnosis not present

## 2019-05-13 DIAGNOSIS — Z794 Long term (current) use of insulin: Secondary | ICD-10-CM | POA: Diagnosis not present

## 2019-05-13 DIAGNOSIS — E1065 Type 1 diabetes mellitus with hyperglycemia: Secondary | ICD-10-CM | POA: Diagnosis not present

## 2019-05-20 ENCOUNTER — Ambulatory Visit: Payer: Medicare Other | Admitting: Neurology

## 2019-05-20 ENCOUNTER — Telehealth: Payer: Self-pay | Admitting: *Deleted

## 2019-05-20 NOTE — Telephone Encounter (Signed)
Per Jael, pt called today and advised she was not feeling well and would miss appt. She will call back to r/s.

## 2019-05-21 ENCOUNTER — Encounter: Payer: Self-pay | Admitting: Neurology

## 2019-05-21 DIAGNOSIS — E119 Type 2 diabetes mellitus without complications: Secondary | ICD-10-CM | POA: Diagnosis not present

## 2019-05-29 DIAGNOSIS — Z0000 Encounter for general adult medical examination without abnormal findings: Secondary | ICD-10-CM | POA: Diagnosis not present

## 2019-05-29 DIAGNOSIS — M810 Age-related osteoporosis without current pathological fracture: Secondary | ICD-10-CM | POA: Diagnosis not present

## 2019-05-29 DIAGNOSIS — K219 Gastro-esophageal reflux disease without esophagitis: Secondary | ICD-10-CM | POA: Diagnosis not present

## 2019-05-29 DIAGNOSIS — E039 Hypothyroidism, unspecified: Secondary | ICD-10-CM | POA: Diagnosis not present

## 2019-05-29 DIAGNOSIS — I700 Atherosclerosis of aorta: Secondary | ICD-10-CM | POA: Diagnosis not present

## 2019-05-29 DIAGNOSIS — F331 Major depressive disorder, recurrent, moderate: Secondary | ICD-10-CM | POA: Diagnosis not present

## 2019-05-29 DIAGNOSIS — I10 Essential (primary) hypertension: Secondary | ICD-10-CM | POA: Diagnosis not present

## 2019-05-29 DIAGNOSIS — E1169 Type 2 diabetes mellitus with other specified complication: Secondary | ICD-10-CM | POA: Diagnosis not present

## 2019-05-29 DIAGNOSIS — E785 Hyperlipidemia, unspecified: Secondary | ICD-10-CM | POA: Diagnosis not present

## 2019-06-05 DIAGNOSIS — E119 Type 2 diabetes mellitus without complications: Secondary | ICD-10-CM | POA: Diagnosis not present

## 2019-06-11 DIAGNOSIS — Z6836 Body mass index (BMI) 36.0-36.9, adult: Secondary | ICD-10-CM | POA: Diagnosis not present

## 2019-06-11 DIAGNOSIS — M50023 Cervical disc disorder at C6-C7 level with myelopathy: Secondary | ICD-10-CM | POA: Diagnosis not present

## 2019-06-11 DIAGNOSIS — I10 Essential (primary) hypertension: Secondary | ICD-10-CM | POA: Diagnosis not present

## 2019-06-23 DIAGNOSIS — Z20828 Contact with and (suspected) exposure to other viral communicable diseases: Secondary | ICD-10-CM | POA: Diagnosis not present

## 2019-07-09 NOTE — Progress Notes (Signed)
[id number]    Provider:  Dr Jaynee Eagles Requesting Provider: Harlan Stains, MD Primary Care Provider:  Harlan Stains, MD  CC:  Headache  HPI:  [name] is a 68 y.o. female here as requested by Harlan Stains, MD for headache.  Past medical history hypertension, DVT on Xarelto, ACDF cervical, diabetes, fibromyalgia, aortic insufficiency.  Being sent for headache.  Patient was seen in the emergency room in July of this year with elevated high blood pressure complaining of headache on the top of the head in the setting of blood pressure 180s over 90s throughout the day, it was 198/100 before she took her blood pressure medications but never significantly decreased, no other focal neurologic symptoms, chronic pain in her neck, she was taking Mucinex which may have been contributing to her hypertension.  CT of the head was negative.  Hydralazine was given, suspected headache due to blood pressure elevation, patient was feeling better when blood pressure improved to the 150s to the 160s.  Mother and grandmother had headaches. Headaches recently started 7-8 months ago. No inciting events, no new medications or head trauma or anything she can tie it back to. Gradually started. [place] trigers. She has pain in the face behind the eyes and in the sinuses, she was told she had sinusitis but Dr. Redmond Baseman ENT said she didn't. The pain comes behind her head, up the neck, and radiates to the temples, feels liks her head is bouncing, pounding and pulsating and throbbing, dull ache, she recently had an ACDF and no changes to the headache since then, not better or worse, she wakes up with the headaches every day, not overly tired during the day, she has headaches every day, she has tylenol, advil, oxycodone, flexeril, lorazepam, heat, ice, no difference, she reports no significant vision changes, the headache is positional and exertional and bending over for example made her hurt and get dizzy.    Reviewed notes, labs and imaging from outside physicians, which showed:  Personally reviewed CT of the head report February 26, 2019 which showed unremarkable brain.  Review of Systems: Patient complains of symptoms per HPI as well as the following symptoms: headache, stress. Pertinent negatives and positives per HPI. All others negative.   Social History   Socioeconomic History  . Marital status: Married    Spouse name: Not on file  . Number of children: 2  . Years of education: trade school  . Highest education level: Not on file  Occupational History  . Not on file  Social Needs  . Financial resource strain: Not on file  . Food insecurity    Worry: Not on file    Inability: Not on file  . Transportation needs    Medical: Not on file    Non-medical: Not on file  Tobacco Use  . Smoking status: Never Smoker  . Smokeless tobacco: Never Used  Substance and Sexual Activity  . Alcohol use: Never    Frequency: Never  . Drug use: Never  . Sexual activity: Yes    Birth control/protection: Post-menopausal, Surgical  Lifestyle  . Physical activity    Days per week: Not on file    Minutes per session: Not on file  . Stress: Not on file  Relationships  . Social Herbalist on phone: Not on file    Gets together: Not on file    Attends religious service: Not on file    Active member of club or organization: Not on  file    Attends meetings of clubs or organizations: Not on file    Relationship status: Not on file  . Intimate partner violence    Fear of current or ex partner: Not on file    Emotionally abused: Not on file    Physically abused: Not on file    Forced sexual activity: Not on file  Other Topics Concern  . Not on file  Social History Narrative   Lives at home with her husband   Right handed   Caffeine: 2-3 cups of coffee/day    Family History  Problem Relation Age of Onset  . Heart attack Father        45s  . Hypothyroidism Father   . Diabetes  type II Father   . Kidney failure Father   . Diabetes type II Mother   . Hypertension Mother   . Breast cancer Mother 62  . Dementia Mother   . Hypothyroidism Brother   . Diabetes Brother   . Breast cancer Maternal Aunt   . Breast cancer Cousin   . Breast cancer Maternal Aunt   . Breast cancer Cousin   . High blood pressure Other        "all"  . Heart attack Brother   . Lung cancer Brother     Past Medical History:  Diagnosis Date  . Anemia    iron deficiency  . Arthritis   . Blood transfusion   . Cervical spondylosis with myelopathy and radiculopathy   . CVA (cerebral vascular accident) ([place])    11/2017  . Depression   . Diabetes mellitus    Type II  . Diastolic dysfunction   . Diverticulitis   . DJD (degenerative joint disease)   . DVT (deep venous thrombosis) ([facility])    times 2 lower leg  . Endometriosis   . Family history of adverse reaction to anesthesia    mom and dad PONV  . Fibromyalgia   . GERD (gastroesophageal reflux disease)    occ  . Hiatal hernia   . History of kidney stones   . Hyperlipidemia   . Hypertension   . Hypothyroidism   . Insomnia   . Mild aortic insufficiency    by echo 04/2013  . Osteoarthritis    back and knee  . Ovarian cyst   . PONV (postoperative nausea and vomiting)   . RLS (restless legs syndrome)   . Thoracic compression fracture ([place])   . Uterine fibroid   . UTI (lower urinary tract infection)   . Villous adenoma of right colon 04/08/2016  . Vitamin D deficiency disease     Patient Active Problem List   Diagnosis Date Noted  . Diabetic hyperosmolar non-ketotic state ([place]) 05/15/2018  . Hyperlipidemia 05/15/2018  . GERD (gastroesophageal reflux disease) 05/15/2018  . Depression with anxiety 05/15/2018  . Cerebral thrombosis with cerebral infarction 11/22/2017  . Acute encephalopathy   . DKA (diabetic ketoacidoses) ([place]) 11/18/2017  . Diabetic ketoacidosis ([place]) 11/18/2017  . Acute respiratory failure with hypoxia ([place])    . Acute renal failure ([place])   . Endotracheal tube present   . Septic shock ([place])   . Spondylolisthesis of lumbar region 11/15/2017  . PAC (premature atrial contraction) 09/04/2017  . Cervical spondylosis with radiculopathy 03/23/2017  . Hypoxia   . Villous adenoma of right colon 04/08/2016  . Leukocytosis 04/08/2016  . Anemia   . Diastolic dysfunction   . Mild aortic insufficiency   . Other nonspecific abnormal cardiovascular system  function study 05/03/2013  . SOB (shortness of breath) 05/01/2013  . Atypical chest pain 02/28/2013  . Dysphagia 02/28/2013  . UTI (lower urinary tract infection) 07/06/2012  . Intractable nausea and vomiting 07/06/2012  . Blood loss anemia 07/06/2012  . Epigastric abdominal pain 06/27/2012  . Type II diabetes mellitus ([place]) 07/28/2011  . Hypothyroidism 07/28/2011  . Hypertension 07/28/2011  . Hepatic steatosis 07/28/2011    Past Surgical History:  Procedure Laterality Date  . ANTERIOR CERVICAL DECOMP/DISCECTOMY FUSION N/A 03/23/2017   Procedure: ANTERIOR CERVICAL DECOMPRESSION/DISCECTOMY FUSION, INTERBODY PROSTHESIS,PLATE CERVICAL THREE- CERVICAL FOUR, CERVICAL FOUR- CERVICAL FIVE, CERVICAL FIVE- CERVICAL SIX;  Surgeon: Newman Pies, MD;  Location: [facility];  Service: Neurosurgery;  Laterality: N/A;  . ANTERIOR CERVICAL DECOMP/DISCECTOMY FUSION N/A 04/18/2019   Procedure: ANTERIOR CERVICAL DECOMPRESSION/DISCECTOMY  CERVICAL SIX- CERVICAL SEVEN;  Surgeon: Newman Pies, MD;  Location: [facility];  Service: Neurosurgery;  Laterality: N/A;  anterior  . APPENDECTOMY    . BACK SURGERY  ,2005   April  2013 - spinal fusion[facility]  . bowel blockage surgery    . BREAST SURGERY     breast reduction  . CARDIAC CATHETERIZATION  04/2013   normal coronary arteries and normal LVF  . CARPAL TUNNEL RELEASE  06/05/2012   Procedure: CARPAL TUNNEL RELEASE;  Surgeon: Cammie Sickle., MD;  Location: [facility];  Service: Orthopedics;  Laterality:  Left;  . CESAREAN SECTION     x 2  . CHOLECYSTECTOMY    . COLECTOMY  04/08/2016  . DILATION AND CURETTAGE OF UTERUS    . DORSAL COMPARTMENT RELEASE  06/05/2012   Procedure: RELEASE DORSAL COMPARTMENT (DEQUERVAIN);  Surgeon: Cammie Sickle., MD;  Location: [facility];  Service: Orthopedics;  Laterality: Left;  Excision of mixoid cyst also  . EYE SURGERY     cataracts bilateral  . JOINT REPLACEMENT     Bilateral knee  . KNEE ARTHROPLASTY  09   lft partial  . KNEE ARTHROPLASTY     rt  . LAPAROSCOPIC PARTIAL COLECTOMY N/A 04/08/2016   Procedure: LAPAROSCOPIC ASSISTED ASCENDING COLECTOMY POSSIBLE OPEN COLECTOMY;  Surgeon: Fanny Skates, MD;  Location: [facility];  Service: General;  Laterality: N/A;  . orthopedic surgeries     multiple  . REDUCTION MAMMAPLASTY Bilateral   . SHOULDER OPEN ROTATOR CUFF REPAIR     rt and lft  . TEE WITHOUT CARDIOVERSION N/A 11/23/2017   Procedure: TRANSESOPHAGEAL ECHOCARDIOGRAM (TEE);  Surgeon: Jerline Pain, MD;  Location: [facility] ENDOSCOPY;  Service: Cardiovascular;  Laterality: N/A;  . TUBAL LIGATION     btsp    Current Outpatient Medications  Medication Sig Dispense Refill  . acetaminophen (TYLENOL) 500 MG tablet Take 1,000 mg by mouth every 8 (eight) hours as needed for mild pain or headache.    Marland Kitchen amLODipine (NORVASC) 2.5 MG tablet Take 5 mg by mouth daily.     Marland Kitchen aspirin EC 81 MG tablet Take 81 mg by mouth daily.    . clobetasol (TEMOVATE) 0.05 % external solution Apply 1 application topically. 5 days per week as needed.  3  . Cyanocobalamin (B-12) 2500 MCG TABS Take 2,500 mcg by mouth daily.    . cyclobenzaprine (FLEXERIL) 10 MG tablet Take 1 tablet (10 mg total) by mouth 3 (three) times daily as needed for muscle spasms. 30 tablet 0  . docusate sodium (COLACE) 100 MG capsule Take 1 capsule (100 mg total) by mouth 2 (two) times daily. 60 capsule 0  .  DULoxetine (CYMBALTA) 60 MG capsule Take 60 mg by mouth daily.     . ferrous sulfate 325  (65 FE) MG tablet Take 1 tablet (325 mg total) by mouth 2 (two) times daily with a meal. (Patient taking differently: Take 325 mg by mouth daily with breakfast. ) 60 tablet 3  . fluticasone (FLONASE) 50 MCG/ACT nasal spray Place 2 sprays into both nostrils daily as needed for allergies or rhinitis.    . furosemide (LASIX) 20 MG tablet Take 20 mg by mouth daily as needed for fluid or edema.     . ibandronate (BONIVA) 150 MG tablet Take 150 mg by mouth every 30 (thirty) days.    Marland Kitchen ibuprofen (ADVIL) 200 MG tablet Take 200 mg by mouth 3 (three) times daily as needed for headache (pain).     . insulin glargine (LANTUS) 100 UNIT/ML injection Inject 40 Units into the skin daily. Takes in am    . insulin lispro (HUMALOG KWIKPEN) 100 UNIT/ML KwikPen Inject 12 Units into the skin 3 (three) times daily.     . irbesartan (AVAPRO) 300 MG tablet Take 300 mg by mouth daily.     Marland Kitchen levothyroxine (SYNTHROID, LEVOTHROID) 150 MCG tablet Take 150 mcg by mouth daily before breakfast.  5  . LORazepam (ATIVAN) 0.5 MG tablet TAKE 1 TABLET AS NEEDED FOR ANXIETY EVERY 8 HRS    . meclizine (ANTIVERT) 12.5 MG tablet Take 25 mg by mouth 3 (three) times daily as needed for dizziness.    Marland Kitchen oxyCODONE-acetaminophen (PERCOCET/ROXICET) 5-325 MG tablet Take 1 tablet by mouth every 4 (four) hours as needed (pain). (Patient taking differently: Take 1 tablet by mouth every 6 (six) hours as needed (pain). ) 30 tablet 0  . pantoprazole (PROTONIX) 40 MG tablet Take 40 mg by mouth daily.    . Pitavastatin Calcium (LIVALO) 2 MG TABS Take 2 mg by mouth every Wednesday.    . Probiotic Product (PROBIOTIC PO) Take 1 capsule by mouth daily.    . promethazine (PHENERGAN) 25 MG tablet Take 25 mg by mouth every 8 (eight) hours as needed for nausea or vomiting.    . sucralfate (CARAFATE) 1 GM/10ML suspension Take 1 g by mouth 4 (four) times daily.    . temazepam (RESTORIL) 15 MG capsule Take 15 mg by mouth at bedtime.   4  . dexlansoprazole (DEXILANT)  60 MG capsule Take 60 mg by mouth daily.    . Fremanezumab-vfrm (AJOVY) 225 MG/1.5ML SOAJ Inject 225 mg into the skin every 30 (thirty) days. 2 pen 11  . gabapentin (NEURONTIN) 300 MG capsule Take 600 mg by mouth at bedtime.   1   No current facility-administered medications for this visit.     Allergies as of 07/10/2019 - Review Complete 07/10/2019  Allergen Reaction Noted  . Codeine Nausea And Vomiting 07/28/2011  . Ambien [zolpidem tartrate] Other (See Comments) 06/26/2012  . Crestor [rosuvastatin] Other (See Comments) 06/26/2012  . Lipitor [atorvastatin] Itching and Other (See Comments) 06/26/2012  . Morphine and related Other (See Comments) 07/06/2012  . Penicillins Rash and Other (See Comments) 07/28/2011  . Statins Other (See Comments) 02/28/2013  . Belsomra [suvorexant]  07/10/2019  . Dilaudid [hydromorphone hcl] Other (See Comments) 08/08/2016  . Heparin Other (See Comments) 11/23/2017  . Amitriptyline Other (See Comments) 05/30/2013  . Ceftin [cefuroxime axetil] Other (See Comments) 12/11/2015  . Lovaza [omega-3-acid ethyl esters] Other (See Comments) 05/30/2013  . Lunesta [eszopiclone] Other (See Comments) 06/26/2012  . Lyrica [pregabalin] Nausea Only  05/30/2013  . Metformin and related Nausea Only     Vitals: BP (!) 146/91 (BP Location: Right Arm, Patient Position: Sitting)   Pulse 95   Temp (!) 96.1 F (35.6 C) Comment: taken at front  Ht 5' 3.5" (1.613 m)   Wt 212 lb (96.2 kg)   BMI 36.97 kg/m  Last Weight:  Wt Readings from Last 1 Encounters:  07/10/19 212 lb (96.2 kg)   Last Height:   Ht Readings from Last 1 Encounters:  07/10/19 5' 3.5" (1.613 m)     Physical exam: Exam: Gen: NAD, conversant, well nourised, obese, well groomed                     CV: RRR, no MRG. No Carotid Bruits. No peripheral edema, warm, nontender Eyes: Conjunctivae clear without exudates or hemorrhage  Neuro: Detailed Neurologic Exam  Speech:    Speech is normal; fluent  and spontaneous with normal comprehension.  Cognition:    The patient is oriented to person, place, and time;     recent and remote memory intact;     language fluent;     normal attention, concentration,     fund of knowledge Cranial Nerves:    The pupils are equal, round, and reactive to light.Attempted fundoscopy could not visualize. . Visual fields are full to finger confrontation. Extraocular movements are intact. Trigeminal sensation is intact and the muscles of mastication are normal. The face is symmetric. The palate elevates in the midline. Hearing intact. Voice is normal. Shoulder shrug is normal. The tongue has normal motion without fasciculations.   Coordination:  No dysmetria   Gait:   No ataxia  Motor Observation:    No asymmetry, no atrophy, and no involuntary movements noted. Tone:    Normal muscle tone.    Posture:    Posture is normal. normal erect    Strength:    Strength is V/V in the upper and lower limbs.      Sensation: intact to LT     Reflex Exam:  DTR's:    Deep tendon reflexes in the upper and lower extremities are symmetrical bilaterally.   Toes:    The toes are equivocal bilaterally.   Clonus:    Clonus is absent.    Assessment/Plan:  68 year old with new onset intractable headache.   - Has some migrainous features but unusual to start having migraines at the age of 68, but possible.  - Also may be occipital irritation, suggested nerve blocks but she declined.  - Can try migraine preventative Ajovy, due to polypharmacy do not want to start another oral medication. Gave 2 samples and will see her back in 8 weeks for close follow up, sooner if needed. At that time if improved may discuss prescribing Ajovy.  - She is also under a lot of stress and her blood pressure has been elevated (she went to the ED for headache with elevated blood pressures) however today is [id number] systolic. Discussed stress management and possibly counseling if needed. - May  consider sleep study as well, morning headaches.  - Feel MRI of the brain is warranted for new onset headache > 50, exertional and positional.MRI brain due to concerning symptoms of morning headaches, positional headaches,vision changes  to look for space occupying mass, chiari or intracranial hypertension (pseudotumor).  - Her glucose is uncontrolled, she is eating a lot and under stress, and this may be a factor as well.   Medications that she  has tried that also treat migraines: amlodipine, cymbalta, gabapentin, meclizine, phenergan  May consider sleep evaluation  Discussed: To prevent or relieve headaches, try the following: Cool Compress. Lie down and place a cool compress on your head.  Avoid headache triggers. If certain foods or odors seem to have triggered your migraines in the past, avoid them. A headache diary might help you identify triggers.  Include physical activity in your daily routine. Try a daily walk or other moderate aerobic exercise.  Manage stress. Find healthy ways to cope with the stressors, such as delegating tasks on your to-do list.  Practice relaxation techniques. Try deep breathing, yoga, massage and visualization.  Eat regularly. Eating regularly scheduled meals and maintaining a healthy diet might help prevent headaches. Also, drink plenty of fluids.  Follow a regular sleep schedule. Sleep deprivation might contribute to headaches Consider biofeedback. With this mind-body technique, you learn to control certain bodily functions - such as muscle tension, heart rate and blood pressure - to prevent headaches or reduce headache pain.    Proceed to emergency room if you experience new or worsening symptoms or symptoms do not resolve, if you have new neurologic symptoms or if headache is severe, or for any concerning symptom.   Provided education and documentation from American headache Society toolbox including articles on: chronic migraine medication overuse  headache, chronic migraines, prevention of migraines, behavioral and other nonpharmacologic treatments for headache.  Orders Placed This Encounter  Procedures  . MR BRAIN W WO CONTRAST  . TSH  . CBC  . CMP  . Hemoglobin A1c   Meds ordered this encounter  Medications  . Fremanezumab-vfrm (AJOVY) 225 MG/1.5ML SOAJ    Sig: Inject 225 mg into the skin every 30 (thirty) days.    Dispense:  2 pen    Refill:  11    Cc: Harlan Stains, MD  Sarina Ill, MD  [facility] [address] [place], [place] [address]  Phone [phone] Fax [phone]

## 2019-07-10 ENCOUNTER — Ambulatory Visit (INDEPENDENT_AMBULATORY_CARE_PROVIDER_SITE_OTHER): Payer: Medicare Other | Admitting: Neurology

## 2019-07-10 ENCOUNTER — Other Ambulatory Visit: Payer: Self-pay

## 2019-07-10 ENCOUNTER — Encounter: Payer: Self-pay | Admitting: Neurology

## 2019-07-10 VITALS — BP 146/91 | HR 95 | Temp 96.1°F | Ht 63.5 in | Wt 212.0 lb

## 2019-07-10 DIAGNOSIS — G441 Vascular headache, not elsewhere classified: Secondary | ICD-10-CM

## 2019-07-10 DIAGNOSIS — G4484 Primary exertional headache: Secondary | ICD-10-CM | POA: Diagnosis not present

## 2019-07-10 DIAGNOSIS — R7309 Other abnormal glucose: Secondary | ICD-10-CM

## 2019-07-10 DIAGNOSIS — R5383 Other fatigue: Secondary | ICD-10-CM

## 2019-07-10 DIAGNOSIS — R519 Headache, unspecified: Secondary | ICD-10-CM | POA: Diagnosis not present

## 2019-07-10 DIAGNOSIS — G43709 Chronic migraine without aura, not intractable, without status migrainosus: Secondary | ICD-10-CM | POA: Diagnosis not present

## 2019-07-10 MED ORDER — AJOVY 225 MG/1.5ML ~~LOC~~ SOAJ: 225.0000 mg | SUBCUTANEOUS | 11 refills | Status: DC

## 2019-07-10 NOTE — Patient Instructions (Signed)
MRI brain Start Ajovy Blood work  Personnel officer injection What is this medicine? FREMANEZUMAB (fre ma NEZ ue mab) is used to prevent migraine headaches. This medicine may be used for other purposes; ask your health care provider or pharmacist if you have questions. COMMON BRAND NAME(S): AJOVY What should I tell my health care provider before I take this medicine? They need to know if you have any of these conditions:  an unusual or allergic reaction to fremanezumab, other medicines, foods, dyes, or preservatives  pregnant or trying to get pregnant  breast-feeding How should I use this medicine? This medicine is for injection under the skin. You will be taught how to prepare and give this medicine. Use exactly as directed. Take your medicine at regular intervals. Do not take your medicine more often than directed. It is important that you put your used needles and syringes in a special sharps container. Do not put them in a trash can. If you do not have a sharps container, call your pharmacist or healthcare provider to get one. Talk to your pediatrician regarding the use of this medicine in children. Special care may be needed. Overdosage: If you think you have taken too much of this medicine contact a poison control center or emergency room at once. NOTE: This medicine is only for you. Do not share this medicine with others. What if I miss a dose? If you miss a dose, take it as soon as you can. If it is almost time for your next dose, take only that dose. Do not take double or extra doses. What may interact with this medicine? Interactions are not expected. This list may not describe all possible interactions. Give your health care provider a list of all the medicines, herbs, non-prescription drugs, or dietary supplements you use. Also tell them if you smoke, drink alcohol, or use illegal drugs. Some items may interact with your medicine. What should I watch for while using this  medicine? Tell your doctor or healthcare professional if your symptoms do not start to get better or if they get worse. What side effects may I notice from receiving this medicine? Side effects that you should report to your doctor or health care professional as soon as possible:  allergic reactions like skin rash, itching or hives, swelling of the face, lips, or tongue Side effects that usually do not require medical attention (report these to your doctor or health care professional if they continue or are bothersome):  pain, redness, or irritation at site where injected This list may not describe all possible side effects. Call your doctor for medical advice about side effects. You may report side effects to FDA at 1-800-FDA-1088. Where should I keep my medicine? Keep out of the reach of children. You will be instructed on how to store this medicine. Throw away any unused medicine after the expiration date on the label. NOTE: This sheet is a summary. It may not cover all possible information. If you have questions about this medicine, talk to your doctor, pharmacist, or health care provider.  2020 Elsevier/Gold Standard (2017-04-24 17:22:56)

## 2019-07-11 LAB — COMPREHENSIVE METABOLIC PANEL
ALT: 17 IU/L (ref 0–32)
AST: 18 IU/L (ref 0–40)
Albumin/Globulin Ratio: 1.7 (ref 1.2–2.2)
Albumin: 4.1 g/dL (ref 3.8–4.8)
Alkaline Phosphatase: 159 IU/L — ABNORMAL HIGH (ref 39–117)
BUN/Creatinine Ratio: 13 (ref 12–28)
BUN: 11 mg/dL (ref 8–27)
Bilirubin Total: 0.4 mg/dL (ref 0.0–1.2)
CO2: 27 mmol/L (ref 20–29)
Calcium: 9.5 mg/dL (ref 8.7–10.3)
Chloride: 100 mmol/L (ref 96–106)
Creatinine, Ser: 0.88 mg/dL (ref 0.57–1.00)
GFR calc Af Amer: 78 mL/min/{1.73_m2} (ref >59)
GFR calc non Af Amer: 67 mL/min/{1.73_m2} (ref >59)
Globulin, Total: 2.4 g/dL (ref 1.5–4.5)
Glucose: 180 mg/dL — ABNORMAL HIGH (ref 65–99)
Potassium: 4.3 mmol/L (ref 3.5–5.2)
Sodium: 143 mmol/L (ref 134–144)
Total Protein: 6.5 g/dL (ref 6.0–8.5)

## 2019-07-11 LAB — CBC
Hematocrit: 42.4 % (ref 34.0–46.6)
Hemoglobin: 13.9 g/dL (ref 11.1–15.9)
MCH: 28.1 pg (ref 26.6–33.0)
MCHC: 32.8 g/dL (ref 31.5–35.7)
MCV: 86 fL (ref 79–97)
Platelets: 234 10*3/uL (ref 150–450)
RBC: 4.95 x10E6/uL (ref 3.77–5.28)
RDW: 13.2 % (ref 11.7–15.4)
WBC: 6 10*3/uL (ref 3.4–10.8)

## 2019-07-11 LAB — TSH: TSH: 1.31 u[IU]/mL (ref 0.450–4.500)

## 2019-07-11 LAB — HEMOGLOBIN A1C
Est. average glucose Bld gHb Est-mCnc: 243 mg/dL
Hgb A1c MFr Bld: 10.1 % — ABNORMAL HIGH (ref 4.8–5.6)

## 2019-07-15 ENCOUNTER — Telehealth: Payer: Self-pay | Admitting: *Deleted

## 2019-07-15 NOTE — Telephone Encounter (Signed)
-----  Message from Melvenia Beam, MD sent at 07/11/2019 10:10 AM EST ----- Glucose is elevated. So is her alk phos, may be fatty liver and due to pbesity. F/u with pcp

## 2019-07-15 NOTE — Telephone Encounter (Signed)
Tried pt again. vm not setup and no answer.

## 2019-07-15 NOTE — Telephone Encounter (Signed)
Tried to reach pt. She was not at home at the time of the call. I will try again later.

## 2019-07-16 NOTE — Telephone Encounter (Signed)
I called the pt and discussed her lab results per Dr. Jaynee Eagles. She verbalized understanding. Her MRI is pending auth/scheduling. Her f/u is scheduled for 09/10/19 @ 2 pm with Amy NP. Pt verbalized appreciation for the call. Her questions were answered.

## 2019-08-13 ENCOUNTER — Other Ambulatory Visit: Payer: Self-pay

## 2019-08-13 ENCOUNTER — Ambulatory Visit
Admission: RE | Admit: 2019-08-13 | Discharge: 2019-08-13 | Disposition: A | Discharge status: Home / Self Care | Payer: Medicare Other | Source: Ambulatory Visit | Attending: Neurology | Admitting: Neurology

## 2019-08-13 DIAGNOSIS — G4484 Primary exertional headache: Secondary | ICD-10-CM

## 2019-08-13 DIAGNOSIS — E119 Type 2 diabetes mellitus without complications: Secondary | ICD-10-CM | POA: Diagnosis not present

## 2019-08-13 DIAGNOSIS — G441 Vascular headache, not elsewhere classified: Secondary | ICD-10-CM

## 2019-08-13 DIAGNOSIS — R519 Headache, unspecified: Secondary | ICD-10-CM

## 2019-08-13 MED ORDER — GADOBENATE DIMEGLUMINE 529 MG/ML IV SOLN
19.0000 mL | Freq: Once | INTRAVENOUS | Status: AC | PRN
  Administered 2019-08-13: 19 mL via INTRAVENOUS

## 2019-08-15 ENCOUNTER — Telehealth: Payer: Self-pay | Admitting: *Deleted

## 2019-08-15 NOTE — Telephone Encounter (Signed)
Patient advised.

## 2019-08-15 NOTE — Telephone Encounter (Signed)
-----   Message from Melvenia Beam, MD sent at 08/14/2019  6:31 PM EST ----- MRI normal for age thanks

## 2019-08-15 NOTE — Telephone Encounter (Signed)
I called pt's home and the gentleman who answered suggested to call pt's cell. I called her cell & the vm box was full. Will try again later.

## 2019-08-19 DIAGNOSIS — R21 Rash and other nonspecific skin eruption: Secondary | ICD-10-CM | POA: Diagnosis not present

## 2019-08-19 DIAGNOSIS — R05 Cough: Secondary | ICD-10-CM | POA: Diagnosis not present

## 2019-08-19 DIAGNOSIS — R52 Pain, unspecified: Secondary | ICD-10-CM | POA: Diagnosis not present

## 2019-08-20 DIAGNOSIS — R05 Cough: Secondary | ICD-10-CM | POA: Diagnosis not present

## 2019-08-20 DIAGNOSIS — R52 Pain, unspecified: Secondary | ICD-10-CM | POA: Diagnosis not present

## 2019-09-03 DIAGNOSIS — I10 Essential (primary) hypertension: Secondary | ICD-10-CM | POA: Diagnosis not present

## 2019-09-03 DIAGNOSIS — M546 Pain in thoracic spine: Secondary | ICD-10-CM | POA: Diagnosis not present

## 2019-09-03 DIAGNOSIS — M5414 Radiculopathy, thoracic region: Secondary | ICD-10-CM | POA: Diagnosis not present

## 2019-09-03 DIAGNOSIS — M542 Cervicalgia: Secondary | ICD-10-CM | POA: Diagnosis not present

## 2019-09-03 DIAGNOSIS — Z6837 Body mass index (BMI) 37.0-37.9, adult: Secondary | ICD-10-CM | POA: Diagnosis not present

## 2019-09-10 ENCOUNTER — Ambulatory Visit (INDEPENDENT_AMBULATORY_CARE_PROVIDER_SITE_OTHER): Payer: Medicare Other | Admitting: Family Medicine

## 2019-09-10 ENCOUNTER — Encounter: Payer: Self-pay | Admitting: Family Medicine

## 2019-09-10 ENCOUNTER — Other Ambulatory Visit: Payer: Self-pay

## 2019-09-10 DIAGNOSIS — G43709 Chronic migraine without aura, not intractable, without status migrainosus: Secondary | ICD-10-CM | POA: Diagnosis not present

## 2019-09-10 MED ORDER — AJOVY 225 MG/1.5ML ~~LOC~~ SOAJ: 225.0000 mg | SUBCUTANEOUS | 11 refills | Status: DC

## 2019-09-10 NOTE — Patient Instructions (Addendum)
I recommend trying plain Mucinex 12 hour, one tablet every twelve hours. Take with Zyrec (one tablet daily)  Take Benadryl if needed for allergy symptoms/headaches   Drink plenty of water (aim for 40-50 ounces a day) Try Vitamin Water Zero Lemonade   Continue Ajovy   Check CBG and BP as advised by PCP  Follow up with me in 3 months    Mindfulness-Based Stress Reduction Mindfulness-based stress reduction (MBSR) is a program that helps people learn to practice mindfulness. Mindfulness is the practice of intentionally paying attention to the present moment. It can be learned and practiced through techniques such as education, breathing exercises, meditation, and yoga. MBSR includes several mindfulness techniques in one program. MBSR works best when you understand the treatment, are willing to try new things, and can commit to spending time practicing what you learn. MBSR training may include learning about:  How your emotions, thoughts, and reactions affect your body.  New ways to respond to things that cause negative thoughts to start (triggers).  How to notice your thoughts and let go of them.  Practicing awareness of everyday things that you normally do without thinking.  The techniques and goals of different types of meditation. What are the benefits of MBSR? MBSR can have many benefits, which include helping you to:  Develop self-awareness. This refers to knowing and understanding yourself.  Learn skills and attitudes that help you to participate in your own health care.  Learn new ways to care for yourself.  Be more accepting about how things are, and let things go.  Be less judgmental and approach things with an open mind.  Be patient with yourself and trust yourself more. MBSR has also been shown to:  Reduce negative emotions, such as depression and anxiety.  Improve memory and focus.  Change how you sense and approach pain.  Boost your body's ability to fight  infections.  Help you connect better with other people.  Improve your sense of well-being. Follow these instructions at home:   Find a local in-person or online MBSR program.  Set aside some time regularly for mindfulness practice.  Find a mindfulness practice that works best for you. This may include one or more of the following: ? Meditation. Meditation involves focusing your mind on a certain thought or activity. ? Breathing awareness exercises. These help you to stay present by focusing on your breath. ? Body scan. For this practice, you lie down and pay attention to each part of your body from head to toe. You can identify tension and soreness and intentionally relax parts of your body. ? Yoga. Yoga involves stretching and breathing, and it can improve your ability to move and be flexible. It can also provide an experience of testing your body's limits, which can help you release stress. ? Mindful eating. This way of eating involves focusing on the taste, texture, color, and smell of each bite of food. Because this slows down eating and helps you feel full sooner, it can be an important part of a weight-loss plan.  Find a podcast or recording that provides guidance for breathing awareness, body scan, or meditation exercises. You can listen to these any time when you have a free moment to rest without distractions.  Follow your treatment plan as told by your health care provider. This may include taking regular medicines and making changes to your diet or lifestyle as recommended. How to practice mindfulness To do a basic awareness exercise:  Find a comfortable place  to sit.  Pay attention to the present moment. Observe your thoughts, feelings, and surroundings just as they are.  Avoid placing judgment on yourself, your feelings, or your surroundings. Make note of any judgment that comes up, and let it go.  Your mind may wander, and that is okay. Make note of when your thoughts  drift, and return your attention to the present moment. To do basic mindfulness meditation:  Find a comfortable place to sit. This may include a stable chair or a firm floor cushion. ? Sit upright with your back straight. Let your arms fall next to your side with your hands resting on your legs. ? If sitting in a chair, rest your feet flat on the floor. ? If sitting on a cushion, cross your legs in front of you.  Keep your head in a neutral position with your chin dropped slightly. Relax your jaw and rest the tip of your tongue on the roof of your mouth. Drop your gaze to the floor. You can close your eyes if you like.  Breathe normally and pay attention to your breath. Feel the air moving in and out of your nose. Feel your belly expanding and relaxing with each breath.  Your mind may wander, and that is okay. Make note of when your thoughts drift, and return your attention to your breath.  Avoid placing judgment on yourself, your feelings, or your surroundings. Make note of any judgment or feelings that come up, let them go, and bring your attention back to your breath.  When you are ready, lift your gaze or open your eyes. Pay attention to how your body feels after the meditation. Where to find more information You can find more information about MBSR from:  Your health care provider.  Community-based meditation centers or programs.  Programs offered near you. Summary  Mindfulness-based stress reduction (MBSR) is a program that teaches you how to intentionally pay attention to the present moment. It is used with other treatments to help you cope better with daily stress, emotions, and pain.  MBSR focuses on developing self-awareness, which allows you to respond to life stress without judgment or negative emotions.  MBSR programs may involve learning different mindfulness practices, such as breathing exercises, meditation, yoga, body scan, or mindful eating. Find a mindfulness  practice that works best for you, and set aside time for it on a regular basis. This information is not intended to replace advice given to you by your health care provider. Make sure you discuss any questions you have with your health care provider. Document Revised: 07/07/2017 Document Reviewed: 12/01/2016 [employer] Patient Education  [employer].    Migraine Headache A migraine headache is a very strong throbbing pain on one side or both sides of your head. This type of headache can also cause other symptoms. It can last from 4 hours to 3 days. Talk with your doctor about what things may bring on (trigger) this condition. What are the causes? The exact cause of this condition is not known. This condition may be triggered or caused by:  Drinking alcohol.  Smoking.  Taking medicines, such as: ? Medicine used to treat chest pain (nitroglycerin). ? Birth control pills. ? Estrogen. ? Some blood pressure medicines.  Eating or drinking certain products.  Doing physical activity. Other things that may trigger a migraine headache include:  Having a menstrual period.  Pregnancy.  Hunger.  Stress.  Not getting enough sleep or getting too much sleep.  Weather  changes.  Tiredness (fatigue). What increases the risk?  Being 53-94 years old.  Being female.  Having a family history of migraine headaches.  Being Caucasian.  Having depression or anxiety.  Being very overweight. What are the signs or symptoms?  A throbbing pain. This pain may: ? Happen in any area of the head, such as on one side or both sides. ? Make it hard to do daily activities. ? Get worse with physical activity. ? Get worse around bright lights or loud noises.  Other symptoms may include: ? Feeling sick to your stomach (nauseous). ? Vomiting. ? Dizziness. ? Being sensitive to bright lights, loud noises, or smells.  Before you get a migraine headache, you may get warning signs (an aura).  An aura may include: ? Seeing flashing lights or having blind spots. ? Seeing bright spots, halos, or zigzag lines. ? Having tunnel vision or blurred vision. ? Having numbness or a tingling feeling. ? Having trouble talking. ? Having weak muscles.  Some people have symptoms after a migraine headache (postdromal phase), such as: ? Tiredness. ? Trouble thinking (concentrating). How is this treated?  Taking medicines that: ? Relieve pain. ? Relieve the feeling of being sick to your stomach. ? Prevent migraine headaches.  Treatment may also include: ? Having acupuncture. ? Avoiding foods that bring on migraine headaches. ? Learning ways to control your body functions (biofeedback). ? Therapy to help you know and deal with negative thoughts (cognitive behavioral therapy). Follow these instructions at home: Medicines  Take over-the-counter and prescription medicines only as told by your doctor.  Ask your doctor if the medicine prescribed to you: ? Requires you to avoid driving or using heavy machinery. ? Can cause trouble pooping (constipation). You may need to take these steps to prevent or treat trouble pooping:  Drink enough fluid to keep your pee (urine) pale yellow.  Take over-the-counter or prescription medicines.  Eat foods that are high in fiber. These include beans, whole grains, and fresh fruits and vegetables.  Limit foods that are high in fat and sugar. These include fried or sweet foods. Lifestyle  Do not drink alcohol.  Do not use any products that contain nicotine or tobacco, such as cigarettes, e-cigarettes, and chewing tobacco. If you need help quitting, ask your doctor.  Get at least 8 hours of sleep every night.  Limit and deal with stress. General instructions      Keep a journal to find out what may bring on your migraine headaches. For example, write down: ? What you eat and drink. ? How much sleep you get. ? Any change in what you eat or  drink. ? Any change in your medicines.  If you have a migraine headache: ? Avoid things that make your symptoms worse, such as bright lights. ? It may help to lie down in a dark, quiet room. ? Do not drive or use heavy machinery. ? Ask your doctor what activities are safe for you.  Keep all follow-up visits as told by your doctor. This is important. Contact a doctor if:  You get a migraine headache that is different or worse than others you have had.  You have more than 15 headache days in one month. Get help right away if:  Your migraine headache gets very bad.  Your migraine headache lasts longer than 72 hours.  You have a fever.  You have a stiff neck.  You have trouble seeing.  Your muscles feel weak or like you  cannot control them.  You start to lose your balance a lot.  You start to have trouble walking.  You pass out (faint).  You have a seizure. Summary  A migraine headache is a very strong throbbing pain on one side or both sides of your head. These headaches can also cause other symptoms.  This condition may be treated with medicines and changes to your lifestyle.  Keep a journal to find out what may bring on your migraine headaches.  Contact a doctor if you get a migraine headache that is different or worse than others you have had.  Contact your doctor if you have more than 15 headache days in a month. This information is not intended to replace advice given to you by your health care provider. Make sure you discuss any questions you have with your health care provider. Document Revised: 11/16/2018 Document Reviewed: 09/06/2018 [employer] Patient Education  [place].

## 2019-09-10 NOTE — Progress Notes (Addendum)
PATIENT: [name] DOB: [birth year]  REASON FOR VISIT: follow up HISTORY FROM: patient  Chief Complaint  Patient presents with  . Follow-up    RM1. Alone. The "shots" She was taken helped. States that her hands are cold all the time recently. States that she will be following up with a neurosurgeon for the neck pain. left ear and left side of head hurts.     HISTORY OF PRESENT ILLNESS: Today 09/10/19 [name] is a 69 y.o. female here today for follow up for new onset headaches. She was started on Ajovy for concerns of daily headaches with migrainous features. MRI normal. She reports that headaches improved in December. She repeated injection in January and does not feel that it was as effective. She has had trouble with sinus infection, sore throat, allergy symptoms and feels that has contributed as well. She no longer has daily headaches. She feels that she had a headache at least 10-15 days last month. Still has a lot of stress. CBG's shot up around 400 after taking doxycycline and prednisone, course completed last week. BP is up and down. She has not taken her BP meds today because she has not felt well. She is having neck pain, s/p ACDF in 04/2019. She continues to follow with Dr Maryjean Ka with [facility]. She has had to take oxycodone more recently due to neck pain.   HISTORY: (copied from Dr Cathren Laine note on 07/10/2019)  HPI:  [name] is a 69 y.o. female here as requested by Harlan Stains, MD for headache.  Past medical history hypertension, DVT on Xarelto, ACDF cervical, diabetes, fibromyalgia, aortic insufficiency.  Being sent for headache.  Patient was seen in the emergency room in July of this year with elevated high blood pressure complaining of headache on the top of the head in the setting of blood pressure 180s over 90s throughout the day, it was 198/100 before she took her blood pressure medications but never significantly decreased, no other focal neurologic  symptoms, chronic pain in her neck, she was taking Mucinex which may have been contributing to her hypertension.  CT of the head was negative.  Hydralazine was given, suspected headache due to blood pressure elevation, patient was feeling better when blood pressure improved to the 150s to the 160s.  Mother and grandmother had headaches. Headaches recently started 7-8 months ago. No inciting events, no new medications or head trauma or anything she can tie it back to. Gradually started. [place] trigers. She has pain in the face behind the eyes and in the sinuses, she was told she had sinusitis but Dr. Redmond [facility] said she didn't. The pain comes behind her head, up the neck, and radiates to the temples, feels liks her head is bouncing, pounding and pulsating and throbbing, dull ache, she recently had an ACDF and no changes to the headache since then, not better or worse, she wakes up with the headaches every day, not overly tired during the day, she has headaches every day, she has tylenol, advil, oxycodone, flexeril, lorazepam, heat, ice, no difference, she reports no significant vision changes, the headache is positional and exertional and bending over for example made her hurt and get dizzy.   Reviewed notes, labs and imaging from outside physicians, which showed:  Personally reviewed CT of the head report February 26, 2019 which showed unremarkable brain.   REVIEW OF SYSTEMS: Out of a complete 14 system review of symptoms, the patient complains only of the  following symptoms, headaches, allergies, neck pain, right arm numbness and all other reviewed systems are negative.  ALLERGIES: Allergies  Allergen Reactions  . Codeine Nausea And Vomiting  . Ambien [Zolpidem Tartrate] Other (See Comments)    Up sleep walking and eating  . Crestor [Rosuvastatin] Other (See Comments)    Muscle weakness, cramps and aching all over body  . Lipitor [Atorvastatin] Itching and Other (See Comments)    Muscle weakness,  cramps and aches all over body  . Morphine And Related Other (See Comments)    Flushing and feeling hot Tolerates with Diphenhydramine  . Penicillins Rash and Other (See Comments)    Caused Headaches also Has patient had a PCN reaction causing immediate rash, facial/tongue/throat swelling, SOB or lightheadedness with hypotension: No Has patient had a PCN reaction causing severe rash involving mucus membranes or skin necrosis: No Has patient had a PCN reaction that required hospitalization No Has patient had a PCN reaction occurring within the last 10 years: No If all of the above answers are "NO", then may proceed with Cephalosporin use.    . Statins Other (See Comments)    Muscle weakness, cramps and aching all over body  . Belsomra [Suvorexant]     ineffective  . Dilaudid [Hydromorphone Hcl] Other (See Comments)    Hallucinations/ argumentative, goes beserk  . Heparin Other (See Comments)    HIT ab positive  (SRA negative 11/25/17)   . Amitriptyline Other (See Comments)    Loopy feeling  . Ceftin [Cefuroxime Axetil] Other (See Comments)    Stomach pain   . Lovaza [Omega-3-Acid Ethyl Esters] Other (See Comments)    Stomach pain   . Lunesta [Eszopiclone] Other (See Comments)    Causes dizziness  . Lyrica [Pregabalin] Nausea Only  . Metformin And Related Nausea Only    HOME MEDICATIONS: Outpatient Medications Prior to Visit  Medication Sig Dispense Refill  . acetaminophen (TYLENOL) 500 MG tablet Take 1,000 mg by mouth every 8 (eight) hours as needed for mild pain or headache.    Marland Kitchen amLODipine (NORVASC) 2.5 MG tablet Take 5 mg by mouth daily.     Marland Kitchen aspirin EC 81 MG tablet Take 81 mg by mouth daily.    . Cholecalciferol (VITAMIN D3) 10 MCG (400 UNIT) CHEW Chew by mouth. OTC    . clobetasol (TEMOVATE) 0.05 % external solution Apply 1 application topically. 5 days per week as needed.  3  . Cyanocobalamin (B-12) 2500 MCG TABS Take 2,500 mcg by mouth daily.    . cyclobenzaprine  (FLEXERIL) 10 MG tablet Take 1 tablet (10 mg total) by mouth 3 (three) times daily as needed for muscle spasms. 30 tablet 0  . dexlansoprazole (DEXILANT) 60 MG capsule Take 60 mg by mouth daily.    Marland Kitchen docusate sodium (COLACE) 100 MG capsule Take 1 capsule (100 mg total) by mouth 2 (two) times daily. 60 capsule 0  . DULoxetine (CYMBALTA) 60 MG capsule Take 60 mg by mouth daily.     . ferrous sulfate 325 (65 FE) MG tablet Take 1 tablet (325 mg total) by mouth 2 (two) times daily with a meal. (Patient taking differently: Take 325 mg by mouth daily with breakfast. ) 60 tablet 3  . fluticasone (FLONASE) 50 MCG/ACT nasal spray Place 2 sprays into both nostrils daily as needed for allergies or rhinitis.    . furosemide (LASIX) 20 MG tablet Take 20 mg by mouth daily as needed for fluid or edema.     Marland Kitchen  gabapentin (NEURONTIN) 300 MG capsule Take 600 mg by mouth at bedtime.   1  . ibandronate (BONIVA) 150 MG tablet Take 150 mg by mouth every 30 (thirty) days.    Marland Kitchen ibuprofen (ADVIL) 200 MG tablet Take 200 mg by mouth 3 (three) times daily as needed for headache (pain).     . insulin glargine (LANTUS) 100 UNIT/ML injection Inject 40 Units into the skin daily. Takes in am    . insulin lispro (HUMALOG KWIKPEN) 100 UNIT/ML KwikPen Inject 12 Units into the skin 3 (three) times daily.     . irbesartan (AVAPRO) 300 MG tablet Take 300 mg by mouth daily.     Marland Kitchen levothyroxine (SYNTHROID, LEVOTHROID) 150 MCG tablet Take 150 mcg by mouth daily before breakfast.  5  . LORazepam (ATIVAN) 0.5 MG tablet TAKE 1 TABLET AS NEEDED FOR ANXIETY EVERY 8 HRS    . Magnesium 100 MG CAPS Take by mouth. OTC    . meclizine (ANTIVERT) 12.5 MG tablet Take 25 mg by mouth 3 (three) times daily as needed for dizziness.    Marland Kitchen oxyCODONE-acetaminophen (PERCOCET/ROXICET) 5-325 MG tablet Take 1 tablet by mouth every 4 (four) hours as needed (pain). (Patient taking differently: Take 1 tablet by mouth every 6 (six) hours as needed (pain). ) 30 tablet 0    . pantoprazole (PROTONIX) 40 MG tablet Take 40 mg by mouth daily.    . Probiotic Product (PROBIOTIC PO) Take 1 capsule by mouth daily.    . promethazine (PHENERGAN) 25 MG tablet Take 25 mg by mouth every 8 (eight) hours as needed for nausea or vomiting.    . sucralfate (CARAFATE) 1 GM/10ML suspension Take 1 g by mouth 4 (four) times daily.    . temazepam (RESTORIL) 15 MG capsule Take 15 mg by mouth at bedtime.   4  . Fremanezumab-vfrm (AJOVY) 225 MG/1.5ML SOAJ Inject 225 mg into the skin every 30 (thirty) days. 2 pen 11  . Pitavastatin Calcium (LIVALO) 2 MG TABS Take 2 mg by mouth every Wednesday.     No facility-administered medications prior to visit.    PAST MEDICAL HISTORY: Past Medical History:  Diagnosis Date  . Anemia    iron deficiency  . Arthritis   . Blood transfusion   . Cervical spondylosis with myelopathy and radiculopathy   . CVA (cerebral vascular accident) ([place])    11/2017  . Depression   . Diabetes mellitus    Type II  . Diastolic dysfunction   . Diverticulitis   . DJD (degenerative joint disease)   . DVT (deep venous thrombosis) ([facility])    times 2 lower leg  . Endometriosis   . Family history of adverse reaction to anesthesia    mom and dad PONV  . Fibromyalgia   . GERD (gastroesophageal reflux disease)    occ  . Hiatal hernia   . History of kidney stones   . Hyperlipidemia   . Hypertension   . Hypothyroidism   . Insomnia   . Mild aortic insufficiency    by echo 04/2013  . Osteoarthritis    back and knee  . Ovarian cyst   . PONV (postoperative nausea and vomiting)   . RLS (restless legs syndrome)   . Thoracic compression fracture ([place])   . Uterine fibroid   . UTI (lower urinary tract infection)   . Villous adenoma of right colon 04/08/2016  . Vitamin D deficiency disease     PAST SURGICAL HISTORY: Past Surgical History:  Procedure Laterality  Date  . ANTERIOR CERVICAL DECOMP/DISCECTOMY FUSION N/A 03/23/2017   Procedure: ANTERIOR CERVICAL  DECOMPRESSION/DISCECTOMY FUSION, INTERBODY PROSTHESIS,PLATE CERVICAL THREE- CERVICAL FOUR, CERVICAL FOUR- CERVICAL FIVE, CERVICAL FIVE- CERVICAL SIX;  Surgeon: Newman Pies, MD;  Location: [facility];  Service: Neurosurgery;  Laterality: N/A;  . ANTERIOR CERVICAL DECOMP/DISCECTOMY FUSION N/A 04/18/2019   Procedure: ANTERIOR CERVICAL DECOMPRESSION/DISCECTOMY  CERVICAL SIX- CERVICAL SEVEN;  Surgeon: Newman Pies, MD;  Location: [facility];  Service: Neurosurgery;  Laterality: N/A;  anterior  . APPENDECTOMY    . BACK SURGERY  ,2005   April  2013 - spinal fusion[facility]  . bowel blockage surgery    . BREAST SURGERY     breast reduction  . CARDIAC CATHETERIZATION  04/2013   normal coronary arteries and normal LVF  . CARPAL TUNNEL RELEASE  06/05/2012   Procedure: CARPAL TUNNEL RELEASE;  Surgeon: Cammie Sickle., MD;  Location: [facility];  Service: Orthopedics;  Laterality: Left;  . CESAREAN SECTION     x 2  . CHOLECYSTECTOMY    . COLECTOMY  04/08/2016  . DILATION AND CURETTAGE OF UTERUS    . DORSAL COMPARTMENT RELEASE  06/05/2012   Procedure: RELEASE DORSAL COMPARTMENT (DEQUERVAIN);  Surgeon: Cammie Sickle., MD;  Location[facility];  Service: Orthopedics;  Laterality: Left;  Excision of mixoid cyst also  . EYE SURGERY     cataracts bilateral  . JOINT REPLACEMENT     Bilateral knee  . KNEE ARTHROPLASTY  09   lft partial  . KNEE ARTHROPLASTY     rt  . LAPAROSCOPIC PARTIAL COLECTOMY N/A 04/08/2016   Procedure: LAPAROSCOPIC ASSISTED ASCENDING COLECTOMY POSSIBLE OPEN COLECTOMY;  Surgeon: Fanny Skates, MD;  Location: [facility];  Service: General;  Laterality: N/A;  . orthopedic surgeries     multiple  . REDUCTION MAMMAPLASTY Bilateral   . SHOULDER OPEN ROTATOR CUFF REPAIR     rt and lft  . TEE WITHOUT CARDIOVERSION N/A 11/23/2017   Procedure: TRANSESOPHAGEAL ECHOCARDIOGRAM (TEE);  Surgeon: Jerline Pain, MD;  Location: [facility];  Service: Cardiovascular;   Laterality: N/A;  . TUBAL LIGATION     btsp    FAMILY HISTORY: Family History  Problem Relation Age of Onset  . Heart attack Father        38s  . Hypothyroidism Father   . Diabetes type II Father   . Kidney failure Father   . Diabetes type II Mother   . Hypertension Mother   . Breast cancer Mother 80  . Dementia Mother   . Hypothyroidism Brother   . Diabetes Brother   . Breast cancer Maternal Aunt   . Breast cancer Cousin   . Breast cancer Maternal Aunt   . Breast cancer Cousin   . High blood pressure Other        "all"  . Heart attack Brother   . Lung cancer Brother     SOCIAL HISTORY: Social History   Socioeconomic History  . Marital status: Married    Spouse name: Not on file  . Number of children: 2  . Years of education: trade school  . Highest education level: Not on file  Occupational History  . Not on file  Tobacco Use  . Smoking status: Never Smoker  . Smokeless tobacco: Never Used  Substance and Sexual Activity  . Alcohol use: Never  . Drug use: Never  . Sexual activity: Yes    Birth control/protection: Post-menopausal, Surgical  Other Topics Concern  .  Not on file  Social History Narrative   Lives at home with her husband   Right handed   Caffeine: 2-3 cups of coffee/day   Social Determinants of Health   Financial Resource Strain:   . Difficulty of Paying Living Expenses: Not on file  Food Insecurity:   . Worried About Charity fundraiser in the Last Year: Not on file  . Ran Out of Food in the Last Year: Not on file  Transportation Needs:   . Lack of Transportation (Medical): Not on file  . Lack of Transportation (Non-Medical): Not on file  Physical Activity:   . Days of Exercise per Week: Not on file  . Minutes of Exercise per Session: Not on file  Stress:   . Feeling of Stress : Not on file  Social Connections:   . Frequency of Communication with Friends and Family: Not on file  . Frequency of Social Gatherings with Friends and  Family: Not on file  . Attends Religious Services: Not on file  . Active Member of Clubs or Organizations: Not on file  . Attends Archivist Meetings: Not on file  . Marital Status: Not on file  Intimate Partner Violence:   . Fear of Current or Ex-Partner: Not on file  . Emotionally Abused: Not on file  . Physically Abused: Not on file  . Sexually Abused: Not on file      PHYSICAL EXAM  Vitals:   09/10/19 1345  BP: (!) 147/93  Pulse: 98  Temp: (!) 97.3 F (36.3 C)  Weight: 211 lb 12.8 oz (96.1 kg)  Height: 5' 3.5" (1.613 m)   Body mass index is 36.93 kg/m.  Generalized: Well developed, in no acute distress  Cardiology: normal rate and rhythm, no murmur noted Respiratory: clear to auscultation bilaterally  Neurological examination  Mentation: Alert oriented to time, place, history taking. Follows all commands speech and language fluent Cranial nerve II-XII: Pupils were equal round reactive to light. Extraocular movements were full, visual field were full  Motor: The motor testing reveals 5 over 5 strength of all 4 extremities. Good symmetric motor tone is noted throughout.  Gait and station: Gait is normal.   DIAGNOSTIC DATA (LABS, IMAGING, TESTING) - I reviewed patient records, labs, notes, testing and imaging myself where available.  No flowsheet data found.   Lab Results  Component Value Date   WBC 6.0 07/10/2019   HGB 13.9 07/10/2019   HCT 42.4 07/10/2019   MCV 86 07/10/2019   PLT 234 07/10/2019      Component Value Date/Time   NA 143 07/10/2019 1442   K 4.3 07/10/2019 1442   CL 100 07/10/2019 1442   CO2 27 07/10/2019 1442   GLUCOSE 180 (H) 07/10/2019 1442   GLUCOSE 217 (H) 04/10/2019 1047   BUN 11 07/10/2019 1442   CREATININE 0.88 07/10/2019 1442   CALCIUM 9.5 07/10/2019 1442   PROT 6.5 07/10/2019 1442   ALBUMIN 4.1 07/10/2019 1442   AST 18 07/10/2019 1442   ALT 17 07/10/2019 1442   ALKPHOS 159 (H) 07/10/2019 1442   BILITOT 0.4  07/10/2019 1442   GFRNONAA 67 07/10/2019 1442   GFRAA 78 07/10/2019 1442   Lab Results  Component Value Date   CHOL 92 11/22/2017   HDL 25 (L) 11/22/2017   LDLCALC 35 11/22/2017   TRIG 159 (H) 11/22/2017   CHOLHDL 3.7 11/22/2017   Lab Results  Component Value Date   HGBA1C 10.1 (H) 07/10/2019   No  results found for: VITAMINB12 Lab Results  Component Value Date   TSH 1.310 07/10/2019     ASSESSMENT AND PLAN 69 y.o. year old female  has a past medical history of Anemia, Arthritis, Blood transfusion, Cervical spondylosis with myelopathy and radiculopathy, CVA (cerebral vascular accident) ([place]), Depression, Diabetes mellitus, Diastolic dysfunction, Diverticulitis, DJD (degenerative joint disease), DVT (deep venous thrombosis) ([place]), Endometriosis, Family history of adverse reaction to anesthesia, Fibromyalgia, GERD (gastroesophageal reflux disease), Hiatal hernia, History of kidney stones, Hyperlipidemia, Hypertension, Hypothyroidism, Insomnia, Mild aortic insufficiency, Osteoarthritis, Ovarian cyst, PONV (postoperative nausea and vomiting), RLS (restless legs syndrome), Thoracic compression fracture ([place]), Uterine fibroid, UTI (lower urinary tract infection), Villous adenoma of right colon (04/08/2016), and Vitamin D deficiency disease. here with     ICD-10-CM   1. Chronic migraine without aura without status migrainosus, not intractable  G43.709 Fremanezumab-vfrm (AJOVY) 225 MG/1.5ML SOAJ    [name] continues to have frequent headaches. I am concerned that multiple factors are playing a role. She wishes to treat sinus symptoms and work on lowering blood pressure and blood sugars. I have advised Mucinex every 12 hours with increased water intake. She will also take Zyrtec daily and benadryl as needed. She will continue BP meds and work on low carb diet. I am hopeful that CBG's will improve now that she has completed prednisone taper. She is aware of when to call PCP for elevated readings. She  will continue Ajovy for now. She will call with worsening symptoms. She will follow up in 3 months, sooner if needed.    No orders of the defined types were placed in this encounter.    Meds ordered this encounter  Medications  . Fremanezumab-vfrm (AJOVY) 225 MG/1.5ML SOAJ    Sig: Inject 225 mg into the skin every 30 (thirty) days.    Dispense:  1 pen    Refill:  11    Order Specific Question:   Supervising Provider    Answer:   Melvenia Beam [id number]      I spent 15 minutes with the patient. 50% of this time was spent counseling and educating patient on plan of care and medications.    Debbora Presto, FNP-C 09/10/2019, 2:46 PM [facility] [address] [place], [place] [address] [phone]  Made any corrections needed, and agree with history, physical, neuro exam,assessment and plan as stated.     Sarina Ill, MD [facility]

## 2019-09-11 ENCOUNTER — Encounter: Payer: Self-pay | Admitting: Family Medicine

## 2019-09-18 DIAGNOSIS — E785 Hyperlipidemia, unspecified: Secondary | ICD-10-CM | POA: Diagnosis not present

## 2019-09-18 DIAGNOSIS — E1165 Type 2 diabetes mellitus with hyperglycemia: Secondary | ICD-10-CM | POA: Diagnosis not present

## 2019-09-18 DIAGNOSIS — M810 Age-related osteoporosis without current pathological fracture: Secondary | ICD-10-CM | POA: Diagnosis not present

## 2019-09-18 DIAGNOSIS — E039 Hypothyroidism, unspecified: Secondary | ICD-10-CM | POA: Diagnosis not present

## 2019-09-18 DIAGNOSIS — E1065 Type 1 diabetes mellitus with hyperglycemia: Secondary | ICD-10-CM | POA: Diagnosis not present

## 2019-09-18 DIAGNOSIS — F331 Major depressive disorder, recurrent, moderate: Secondary | ICD-10-CM | POA: Diagnosis not present

## 2019-09-18 DIAGNOSIS — D509 Iron deficiency anemia, unspecified: Secondary | ICD-10-CM | POA: Diagnosis not present

## 2019-09-18 DIAGNOSIS — E1169 Type 2 diabetes mellitus with other specified complication: Secondary | ICD-10-CM | POA: Diagnosis not present

## 2019-09-18 DIAGNOSIS — I10 Essential (primary) hypertension: Secondary | ICD-10-CM | POA: Diagnosis not present

## 2019-09-24 DIAGNOSIS — M546 Pain in thoracic spine: Secondary | ICD-10-CM | POA: Diagnosis not present

## 2019-10-07 DIAGNOSIS — E119 Type 2 diabetes mellitus without complications: Secondary | ICD-10-CM | POA: Diagnosis not present

## 2019-10-15 DIAGNOSIS — Z8639 Personal history of other endocrine, nutritional and metabolic disease: Secondary | ICD-10-CM | POA: Diagnosis not present

## 2019-10-15 DIAGNOSIS — E039 Hypothyroidism, unspecified: Secondary | ICD-10-CM | POA: Diagnosis not present

## 2019-10-15 DIAGNOSIS — Z794 Long term (current) use of insulin: Secondary | ICD-10-CM | POA: Diagnosis not present

## 2019-10-15 DIAGNOSIS — E1065 Type 1 diabetes mellitus with hyperglycemia: Secondary | ICD-10-CM | POA: Diagnosis not present

## 2019-10-15 DIAGNOSIS — Z7189 Other specified counseling: Secondary | ICD-10-CM | POA: Diagnosis not present

## 2019-10-17 DIAGNOSIS — M47812 Spondylosis without myelopathy or radiculopathy, cervical region: Secondary | ICD-10-CM | POA: Diagnosis not present

## 2019-10-17 DIAGNOSIS — M5134 Other intervertebral disc degeneration, thoracic region: Secondary | ICD-10-CM | POA: Diagnosis not present

## 2019-11-04 ENCOUNTER — Telehealth: Payer: Self-pay | Admitting: Family Medicine

## 2019-11-04 NOTE — Telephone Encounter (Signed)
Pt called stating that the Fremanezumab-vfrm (AJOVY) 225 MG/1.5ML SOAJ is too expensive and she is needing something more affordable. Please advise.

## 2019-11-04 NOTE — Telephone Encounter (Signed)
She may check with her insurance to see if Amovig or Emgality is covered. Unfortunately, I do not have a way to tell what the medications may cost.

## 2019-11-06 DIAGNOSIS — E119 Type 2 diabetes mellitus without complications: Secondary | ICD-10-CM | POA: Diagnosis not present

## 2019-11-06 DIAGNOSIS — F331 Major depressive disorder, recurrent, moderate: Secondary | ICD-10-CM | POA: Diagnosis not present

## 2019-11-06 DIAGNOSIS — E1165 Type 2 diabetes mellitus with hyperglycemia: Secondary | ICD-10-CM | POA: Diagnosis not present

## 2019-11-06 DIAGNOSIS — E1169 Type 2 diabetes mellitus with other specified complication: Secondary | ICD-10-CM | POA: Diagnosis not present

## 2019-11-06 DIAGNOSIS — I10 Essential (primary) hypertension: Secondary | ICD-10-CM | POA: Diagnosis not present

## 2019-11-06 DIAGNOSIS — E039 Hypothyroidism, unspecified: Secondary | ICD-10-CM | POA: Diagnosis not present

## 2019-11-06 DIAGNOSIS — E785 Hyperlipidemia, unspecified: Secondary | ICD-10-CM | POA: Diagnosis not present

## 2019-11-06 DIAGNOSIS — D509 Iron deficiency anemia, unspecified: Secondary | ICD-10-CM | POA: Diagnosis not present

## 2019-11-06 DIAGNOSIS — M810 Age-related osteoporosis without current pathological fracture: Secondary | ICD-10-CM | POA: Diagnosis not present

## 2019-11-06 DIAGNOSIS — G4700 Insomnia, unspecified: Secondary | ICD-10-CM | POA: Diagnosis not present

## 2019-11-06 DIAGNOSIS — E1065 Type 1 diabetes mellitus with hyperglycemia: Secondary | ICD-10-CM | POA: Diagnosis not present

## 2019-11-07 DIAGNOSIS — L218 Other seborrheic dermatitis: Secondary | ICD-10-CM | POA: Diagnosis not present

## 2019-11-07 DIAGNOSIS — D225 Melanocytic nevi of trunk: Secondary | ICD-10-CM | POA: Diagnosis not present

## 2019-11-11 MED ORDER — ERENUMAB-AOOE 140 MG/ML ~~LOC~~ SOAJ: 140.0000 mg | SUBCUTANEOUS | 3 refills | Status: DC

## 2019-11-11 NOTE — Addendum Note (Signed)
Addended by: Debbora Presto L on: 11/11/2019 07:21 PM   Modules accepted: Orders

## 2019-11-11 NOTE — Telephone Encounter (Signed)
Amovig has been called into her pharmacy. Instructions are same as with Ajovy. One injection every 30 days.

## 2019-11-14 DIAGNOSIS — M47812 Spondylosis without myelopathy or radiculopathy, cervical region: Secondary | ICD-10-CM | POA: Diagnosis not present

## 2019-11-15 DIAGNOSIS — K219 Gastro-esophageal reflux disease without esophagitis: Secondary | ICD-10-CM | POA: Diagnosis not present

## 2019-11-15 DIAGNOSIS — K56600 Partial intestinal obstruction, unspecified as to cause: Secondary | ICD-10-CM | POA: Diagnosis not present

## 2019-11-19 ENCOUNTER — Other Ambulatory Visit: Payer: Self-pay | Admitting: Family Medicine

## 2019-11-19 DIAGNOSIS — M5442 Lumbago with sciatica, left side: Secondary | ICD-10-CM | POA: Diagnosis not present

## 2019-11-19 DIAGNOSIS — Z1231 Encounter for screening mammogram for malignant neoplasm of breast: Secondary | ICD-10-CM

## 2019-11-19 DIAGNOSIS — I10 Essential (primary) hypertension: Secondary | ICD-10-CM | POA: Diagnosis not present

## 2019-11-19 DIAGNOSIS — Z6836 Body mass index (BMI) 36.0-36.9, adult: Secondary | ICD-10-CM | POA: Diagnosis not present

## 2019-11-20 ENCOUNTER — Ambulatory Visit: Payer: Medicare Other

## 2019-11-22 ENCOUNTER — Other Ambulatory Visit: Payer: Self-pay | Admitting: Neurosurgery

## 2019-11-22 DIAGNOSIS — M5442 Lumbago with sciatica, left side: Secondary | ICD-10-CM

## 2019-11-28 DIAGNOSIS — M5442 Lumbago with sciatica, left side: Secondary | ICD-10-CM | POA: Diagnosis not present

## 2019-11-28 DIAGNOSIS — M47812 Spondylosis without myelopathy or radiculopathy, cervical region: Secondary | ICD-10-CM | POA: Diagnosis not present

## 2019-12-02 ENCOUNTER — Emergency Department (HOSPITAL_COMMUNITY)
Admission: EM | Admit: 2019-12-02 | Discharge: 2019-12-02 | Disposition: A | Discharge status: Home / Self Care | Payer: Medicare Other | Attending: Emergency Medicine | Admitting: Emergency Medicine

## 2019-12-02 ENCOUNTER — Emergency Department (HOSPITAL_COMMUNITY): Payer: Medicare Other

## 2019-12-02 DIAGNOSIS — Z96653 Presence of artificial knee joint, bilateral: Secondary | ICD-10-CM | POA: Diagnosis not present

## 2019-12-02 DIAGNOSIS — I110 Hypertensive heart disease with heart failure: Secondary | ICD-10-CM | POA: Insufficient documentation

## 2019-12-02 DIAGNOSIS — E039 Hypothyroidism, unspecified: Secondary | ICD-10-CM | POA: Diagnosis not present

## 2019-12-02 DIAGNOSIS — R1013 Epigastric pain: Secondary | ICD-10-CM | POA: Diagnosis not present

## 2019-12-02 DIAGNOSIS — I5032 Chronic diastolic (congestive) heart failure: Secondary | ICD-10-CM | POA: Insufficient documentation

## 2019-12-02 DIAGNOSIS — Z794 Long term (current) use of insulin: Secondary | ICD-10-CM | POA: Insufficient documentation

## 2019-12-02 DIAGNOSIS — E119 Type 2 diabetes mellitus without complications: Secondary | ICD-10-CM | POA: Insufficient documentation

## 2019-12-02 DIAGNOSIS — R079 Chest pain, unspecified: Secondary | ICD-10-CM | POA: Diagnosis not present

## 2019-12-02 DIAGNOSIS — R0602 Shortness of breath: Secondary | ICD-10-CM | POA: Diagnosis not present

## 2019-12-02 DIAGNOSIS — R0789 Other chest pain: Secondary | ICD-10-CM | POA: Diagnosis present

## 2019-12-02 DIAGNOSIS — Z7982 Long term (current) use of aspirin: Secondary | ICD-10-CM | POA: Insufficient documentation

## 2019-12-02 DIAGNOSIS — Z79899 Other long term (current) drug therapy: Secondary | ICD-10-CM | POA: Insufficient documentation

## 2019-12-02 DIAGNOSIS — R109 Unspecified abdominal pain: Secondary | ICD-10-CM | POA: Diagnosis not present

## 2019-12-02 LAB — COMPREHENSIVE METABOLIC PANEL
ALT: 15 U/L (ref 0–44)
AST: 17 U/L (ref 15–41)
Albumin: 3.8 g/dL (ref 3.5–5.0)
Alkaline Phosphatase: 125 U/L (ref 38–126)
Anion gap: 16 — ABNORMAL HIGH (ref 5–15)
BUN: 16 mg/dL (ref 8–23)
CO2: 24 mmol/L (ref 22–32)
Calcium: 9.4 mg/dL (ref 8.9–10.3)
Chloride: 94 mmol/L — ABNORMAL LOW (ref 98–111)
Creatinine, Ser: 1.15 mg/dL — ABNORMAL HIGH (ref 0.44–1.00)
GFR calc Af Amer: 56 mL/min — ABNORMAL LOW (ref >60)
GFR calc non Af Amer: 49 mL/min — ABNORMAL LOW (ref >60)
Glucose, Bld: 206 mg/dL — ABNORMAL HIGH (ref 70–99)
Potassium: 3.8 mmol/L (ref 3.5–5.1)
Sodium: 134 mmol/L — ABNORMAL LOW (ref 135–145)
Total Bilirubin: 0.4 mg/dL (ref 0.3–1.2)
Total Protein: 7 g/dL (ref 6.5–8.1)

## 2019-12-02 LAB — LIPASE, BLOOD: Lipase: 21 U/L (ref 11–51)

## 2019-12-02 LAB — CBC WITH DIFFERENTIAL/PLATELET
Abs Immature Granulocytes: 0.03 10*3/uL (ref 0.00–0.07)
Basophils Absolute: 0.1 10*3/uL (ref 0.0–0.1)
Basophils Relative: 1 %
Eosinophils Absolute: 0.2 10*3/uL (ref 0.0–0.5)
Eosinophils Relative: 2 %
HCT: 42.2 % (ref 36.0–46.0)
Hemoglobin: 14 g/dL (ref 12.0–15.0)
Immature Granulocytes: 0 %
Lymphocytes Relative: 34 %
Lymphs Abs: 2.7 10*3/uL (ref 0.7–4.0)
MCH: 28.7 pg (ref 26.0–34.0)
MCHC: 33.2 g/dL (ref 30.0–36.0)
MCV: 86.7 fL (ref 80.0–100.0)
Monocytes Absolute: 0.4 10*3/uL (ref 0.1–1.0)
Monocytes Relative: 5 %
Neutro Abs: 4.6 10*3/uL (ref 1.7–7.7)
Neutrophils Relative %: 58 %
Platelets: 302 10*3/uL (ref 150–400)
RBC: 4.87 MIL/uL (ref 3.87–5.11)
RDW: 12.1 % (ref 11.5–15.5)
WBC: 7.9 10*3/uL (ref 4.0–10.5)
nRBC: 0 % (ref 0.0–0.2)

## 2019-12-02 LAB — TROPONIN I (HIGH SENSITIVITY)
Troponin I (High Sensitivity): 6 ng/L (ref <18)
Troponin I (High Sensitivity): 6 ng/L (ref <18)

## 2019-12-02 LAB — D-DIMER, QUANTITATIVE: D-Dimer, Quant: 2.71 ug/mL-FEU — ABNORMAL HIGH (ref 0.00–0.50)

## 2019-12-02 MED ORDER — IOHEXOL 350 MG/ML SOLN
100.0000 mL | Freq: Once | INTRAVENOUS | Status: AC | PRN
  Administered 2019-12-02: 100 mL via INTRAVENOUS

## 2019-12-02 MED ORDER — MORPHINE SULFATE 15 MG PO TABS: 7.5000 mg | ORAL_TABLET | ORAL | 0 refills | Status: DC | PRN

## 2019-12-02 MED ORDER — SODIUM CHLORIDE 0.9 % IV BOLUS
1000.0000 mL | Freq: Once | INTRAVENOUS | Status: AC
  Administered 2019-12-02: 1000 mL via INTRAVENOUS

## 2019-12-02 MED ORDER — DIPHENHYDRAMINE HCL 50 MG/ML IJ SOLN
25.0000 mg | Freq: Once | INTRAMUSCULAR | Status: AC
  Administered 2019-12-02: 25 mg via INTRAVENOUS
  Filled 2019-12-02: qty 1

## 2019-12-02 MED ORDER — ONDANSETRON 4 MG PO TBDP
4.0000 mg | ORAL_TABLET | Freq: Three times a day (TID) | ORAL | 0 refills | Status: DC | PRN
Start: 2019-12-02 — End: 2022-06-02

## 2019-12-02 MED ORDER — ONDANSETRON HCL 4 MG/2ML IJ SOLN
4.0000 mg | Freq: Once | INTRAMUSCULAR | Status: AC
  Administered 2019-12-02: 4 mg via INTRAVENOUS
  Filled 2019-12-02: qty 2

## 2019-12-02 MED ORDER — MORPHINE SULFATE (PF) 4 MG/ML IV SOLN
4.0000 mg | Freq: Once | INTRAVENOUS | Status: AC
  Administered 2019-12-02: 4 mg via INTRAVENOUS
  Filled 2019-12-02: qty 1

## 2019-12-02 NOTE — Discharge Instructions (Signed)
Try to avoid things that may make this worse, most commonly these are spicy foods tomato based products fatty foods chocolate and peppermint.  Alcohol and tobacco can also make this worse.  Return to the emergency department for sudden worsening pain fever or inability to eat or drink.

## 2019-12-02 NOTE — ED Triage Notes (Signed)
Pt presents from home for center CP, radiating to neck, L shoulder/L arm, back and jaw. Began when driving today, describes as crushing/burning, 9/10. 150/80, P90 at home .  H/o stroke, blood clot, pnu. Currently only on ASA, was on eliquis previously

## 2019-12-02 NOTE — ED Provider Notes (Signed)
[name]   CSN: [id number] Arrival date & time: 12/02/19  1457     History Chief Complaint  Patient presents with  . Chest Pain    [name] is a 69 y.o. female.  69 yo F with a chief complaints of chest pain.  This started about an hour ago.  Occurred while she was driving her car.  She feels that the chest is right-sided goes into her jaw and then into her back.  Laying back may be makes it slightly better.  Nothing seems to make it worse.  Denies shortness of breath denies diaphoresis denies vomiting.  Has had some nausea with it.  Has never had pain like this before.  She denies history of MI.  Has a history of hypertension hyperlipidemia and diabetes and family history of MI.  Denies smoking.  Has had a prior PE.  She is unsure what the PE felt like before.  The history is provided by the patient.  Chest Pain Pain location:  R chest Pain quality: aching and sharp   Pain radiates to:  Neck Pain severity:  Moderate Onset quality:  Gradual Duration:  1 hour Timing:  Constant Progression:  Partially resolved Chronicity:  New Relieved by:  Nothing Worsened by:  Nothing Ineffective treatments:  None tried Associated symptoms: abdominal pain and nausea   Associated symptoms: no dizziness, no fever, no headache, no palpitations, no shortness of breath and no vomiting        Past Medical History:  Diagnosis Date  . Anemia    iron deficiency  . Arthritis   . Blood transfusion   . Cervical spondylosis with myelopathy and radiculopathy   . CVA (cerebral vascular accident) ([place])    11/2017  . Depression   . Diabetes mellitus    Type II  . Diastolic dysfunction   . Diverticulitis   . DJD (degenerative joint disease)   . DVT (deep venous thrombosis) ([facility])    times 2 lower leg  . Endometriosis   . Family history of adverse reaction to anesthesia    mom and dad PONV  . Fibromyalgia   . GERD (gastroesophageal  reflux disease)    occ  . Hiatal hernia   . History of kidney stones   . Hyperlipidemia   . Hypertension   . Hypothyroidism   . Insomnia   . Mild aortic insufficiency    by echo 04/2013  . Osteoarthritis    back and knee  . Ovarian cyst   . PONV (postoperative nausea and vomiting)   . RLS (restless legs syndrome)   . Thoracic compression fracture ([place])   . Uterine fibroid   . UTI (lower urinary tract infection)   . Villous adenoma of right colon 04/08/2016  . Vitamin D deficiency disease     Patient Active Problem List   Diagnosis Date Noted  . Diabetic hyperosmolar non-ketotic state ([place]) 05/15/2018  . Hyperlipidemia 05/15/2018  . GERD (gastroesophageal reflux disease) 05/15/2018  . Depression with anxiety 05/15/2018  . Cerebral thrombosis with cerebral infarction 11/22/2017  . Acute encephalopathy   . DKA (diabetic ketoacidoses) ([place]) 11/18/2017  . Diabetic ketoacidosis ([place]) 11/18/2017  . Acute respiratory failure with hypoxia ([place])   . Acute renal failure ([place])   . Endotracheal tube present   . Septic shock ([place])   . Spondylolisthesis of lumbar region 11/15/2017  . PAC (premature atrial contraction) 09/04/2017  . Cervical spondylosis with radiculopathy 03/23/2017  .  Hypoxia   . Villous adenoma of right colon 04/08/2016  . Leukocytosis 04/08/2016  . Anemia   . Diastolic dysfunction   . Mild aortic insufficiency   . Other nonspecific abnormal cardiovascular system function study 05/03/2013  . SOB (shortness of breath) 05/01/2013  . Atypical chest pain 02/28/2013  . Dysphagia 02/28/2013  . UTI (lower urinary tract infection) 07/06/2012  . Intractable nausea and vomiting 07/06/2012  . Blood loss anemia 07/06/2012  . Epigastric abdominal pain 06/27/2012  . Type II diabetes mellitus ([place]) 07/28/2011  . Hypothyroidism 07/28/2011  . Hypertension 07/28/2011  . Hepatic steatosis 07/28/2011    Past Surgical History:  Procedure Laterality Date  . ANTERIOR CERVICAL  DECOMP/DISCECTOMY FUSION N/A 03/23/2017   Procedure: ANTERIOR CERVICAL DECOMPRESSION/DISCECTOMY FUSION, INTERBODY PROSTHESIS,PLATE CERVICAL THREE- CERVICAL FOUR, CERVICAL FOUR- CERVICAL FIVE, CERVICAL FIVE- CERVICAL SIX;  Surgeon: Newman Pies, MD;  Location: [place];  Service: Neurosurgery;  Laterality: N/A;  . ANTERIOR CERVICAL DECOMP/DISCECTOMY FUSION N/A 04/18/2019   Procedure: ANTERIOR CERVICAL DECOMPRESSION/DISCECTOMY  CERVICAL SIX- CERVICAL SEVEN;  Surgeon: Newman Pies, MD;  Location: [place];  Service: Neurosurgery;  Laterality: N/A;  anterior  . APPENDECTOMY    . BACK SURGERY  ,2005   April  2013 - spinal fusion@ [facility]  . bowel blockage surgery    . BREAST SURGERY     breast reduction  . CARDIAC CATHETERIZATION  04/2013   normal coronary arteries and normal LVF  . CARPAL TUNNEL RELEASE  06/05/2012   Procedure: CARPAL TUNNEL RELEASE;  Surgeon: Cammie Sickle., MD;  Location: [place];  Service: Orthopedics;  Laterality: Left;  . CESAREAN SECTION     x 2  . CHOLECYSTECTOMY    . COLECTOMY  04/08/2016  . DILATION AND CURETTAGE OF UTERUS    . DORSAL COMPARTMENT RELEASE  06/05/2012   Procedure: RELEASE DORSAL COMPARTMENT (DEQUERVAIN);  Surgeon: Cammie Sickle., MD;  Location: [facility];  Service: Orthopedics;  Laterality: Left;  Excision of mixoid cyst also  . EYE SURGERY     cataracts bilateral  . JOINT REPLACEMENT     Bilateral knee  . KNEE ARTHROPLASTY  09   lft partial  . KNEE ARTHROPLASTY     rt  . LAPAROSCOPIC PARTIAL COLECTOMY N/A 04/08/2016   Procedure: LAPAROSCOPIC ASSISTED ASCENDING COLECTOMY POSSIBLE OPEN COLECTOMY;  Surgeon: Fanny Skates, MD;  Location: [place];  Service: General;  Laterality: N/A;  . orthopedic surgeries     multiple  . REDUCTION MAMMAPLASTY Bilateral   . SHOULDER OPEN ROTATOR CUFF REPAIR     rt and lft  . TEE WITHOUT CARDIOVERSION N/A 11/23/2017   Procedure: TRANSESOPHAGEAL ECHOCARDIOGRAM (TEE);  Surgeon:  Jerline Pain, MD;  Location: [facility] ENDOSCOPY;  Service: Cardiovascular;  Laterality: N/A;  . TUBAL LIGATION     btsp     OB History    Gravida  2   Para  2   Term  2   Preterm      AB      Living  2     SAB      TAB      Ectopic      Multiple      Live Births              Family History  Problem Relation Age of Onset  . Heart attack Father        87s  . Hypothyroidism Father   . Diabetes type II Father   .  Kidney failure Father   . Diabetes type II Mother   . Hypertension Mother   . Breast cancer Mother 22  . Dementia Mother   . Hypothyroidism Brother   . Diabetes Brother   . Breast cancer Maternal Aunt   . Breast cancer Cousin   . Breast cancer Maternal Aunt   . Breast cancer Cousin   . High blood pressure Other        "all"  . Heart attack Brother   . Lung cancer Brother     Social History   Tobacco Use  . Smoking status: Never Smoker  . Smokeless tobacco: Never Used  Substance Use Topics  . Alcohol use: Never  . Drug use: Never    Home Medications Prior to Admission medications   Medication Sig Start Date End Date Taking? Authorizing Provider  acetaminophen (TYLENOL) 500 MG tablet Take 1,000 mg by mouth every 8 (eight) hours as needed for mild pain or headache.    [provider]  amLODipine (NORVASC) 2.5 MG tablet Take 5 mg by mouth daily.  10/27/18   [provider]  aspirin EC 81 MG tablet Take 81 mg by mouth daily.    [provider]  Cholecalciferol (VITAMIN D3) 10 MCG (400 UNIT) CHEW Chew by mouth. OTC    [provider]  clobetasol (TEMOVATE) 0.05 % external solution Apply 1 application topically. 5 days per week as needed. 07/13/16   [provider]  Cyanocobalamin (B-12) 2500 MCG TABS Take 2,500 mcg by mouth daily.    [provider]  cyclobenzaprine (FLEXERIL) 10 MG tablet Take 1 tablet (10 mg total) by mouth 3 (three) times daily as needed for muscle spasms. 04/19/19   Viona Gilmore D, NP  dexlansoprazole (DEXILANT) 60 MG capsule Take 60 mg by mouth daily.    [provider]  docusate sodium (COLACE) 100 MG capsule Take 1 capsule (100 mg total) by mouth 2 (two) times daily. 11/17/17   Newman Pies, MD  DULoxetine (CYMBALTA) 60 MG capsule Take 60 mg by mouth daily.     [provider]  Erenumab-aooe 140 MG/ML SOAJ Inject 140 mg into the skin every 30 (thirty) days. 11/11/19   Lomax, Amy, NP  ferrous sulfate 325 (65 FE) MG tablet Take 1 tablet (325 mg total) by mouth 2 (two) times daily with a meal. Patient taking differently: Take 325 mg by mouth daily with breakfast.  11/17/17   Newman Pies, MD  fluticasone [facility]) 50 MCG/ACT nasal spray Place 2 sprays into both nostrils daily as needed for allergies or rhinitis.    [provider]  Fremanezumab-vfrm (AJOVY) 225 MG/1.5ML SOAJ Inject 225 mg into the skin every 30 (thirty) days. 09/10/19   Lomax, Amy, NP  furosemide (LASIX) 20 MG tablet Take 20 mg by mouth daily as needed for fluid or edema.     [provider]  gabapentin (NEURONTIN) 300 MG capsule Take 600 mg by mouth at bedtime.  03/30/18   [provider]  ibandronate (BONIVA) 150 MG tablet Take 150 mg by mouth every 30 (thirty) days. 03/06/19   [provider]  ibuprofen (ADVIL) 200 MG tablet Take 200 mg by mouth 3 (three) times daily as needed for headache (pain).     [provider]  insulin glargine (LANTUS) 100 UNIT/ML injection Inject 40 Units into the skin daily. Takes in am    [provider]  insulin lispro (HUMALOG KWIKPEN) 100 UNIT/ML KwikPen Inject 12 Units into  the skin 3 (three) times daily.     [provider]  irbesartan (AVAPRO) 300 MG tablet Take 300 mg by mouth daily.  11/15/18   [provider]  levothyroxine (SYNTHROID, LEVOTHROID) 150 MCG tablet Take 150 mcg by mouth daily before breakfast. 10/10/17   [provider]  LORazepam (ATIVAN) 0.5 MG tablet  TAKE 1 TABLET AS NEEDED FOR ANXIETY EVERY 8 HRS 03/15/19   [provider]  Magnesium 100 MG CAPS Take by mouth. OTC    [provider]  meclizine (ANTIVERT) 12.5 MG tablet Take 25 mg by mouth 3 (three) times daily as needed for dizziness.    [provider]  morphine (MSIR) 15 MG tablet Take 0.5 tablets (7.5 mg total) by mouth every 4 (four) hours as needed for severe pain. 12/02/19   Deno Etienne, DO  ondansetron (ZOFRAN ODT) 4 MG disintegrating tablet Take 1 tablet (4 mg total) by mouth every 8 (eight) hours as needed for nausea or vomiting. 12/02/19   Deno Etienne, DO  oxyCODONE-acetaminophen (PERCOCET/ROXICET) 5-325 MG tablet Take 1 tablet by mouth every 4 (four) hours as needed (pain). Patient taking differently: Take 1 tablet by mouth every 6 (six) hours as needed (pain).  04/19/19   Viona Gilmore D, NP  pantoprazole (PROTONIX) 40 MG tablet Take 40 mg by mouth daily. 07/03/19   [provider]  Probiotic Product (PROBIOTIC PO) Take 1 capsule by mouth daily.    [provider]  promethazine (PHENERGAN) 25 MG tablet Take 25 mg by mouth every 8 (eight) hours as needed for nausea or vomiting.    [provider]  sucralfate (CARAFATE) 1 GM/10ML suspension Take 1 g by mouth 4 (four) times daily.    [provider]  temazepam (RESTORIL) 15 MG capsule Take 15 mg by mouth at bedtime.  10/10/17   [provider]    Allergies    Codeine, Ambien [zolpidem tartrate], Crestor [rosuvastatin], Lipitor [atorvastatin], Morphine and related, Penicillins, Statins, Belsomra [suvorexant], Dilaudid [hydromorphone hcl], Heparin, Amitriptyline, Ceftin [cefuroxime axetil], Lovaza [omega-3-acid ethyl esters], Lunesta [eszopiclone], Lyrica [pregabalin], and Metformin and related  Review of Systems   Review of Systems  Constitutional: Negative for chills and fever.  HENT: Negative for congestion and rhinorrhea.   Eyes: Negative for redness and visual  disturbance.  Respiratory: Negative for shortness of breath and wheezing.   Cardiovascular: Positive for chest pain. Negative for palpitations.  Gastrointestinal: Positive for abdominal pain and nausea. Negative for vomiting.  Genitourinary: Negative for dysuria and urgency.  Musculoskeletal: Negative for arthralgias and myalgias.  Skin: Negative for pallor and wound.  Neurological: Negative for dizziness and headaches.    Physical Exam Updated Vital Signs BP (!) 143/67   Pulse 90   Temp 98.9 F (37.2 C) (Oral)   Resp 12   SpO2 96%   Physical Exam Vitals and nursing [name] reviewed.  Constitutional:      General: She is not in acute distress.    Appearance: She is well-developed. She is not diaphoretic.  HENT:     Head: Normocephalic and atraumatic.  Eyes:     Pupils: Pupils are equal, round, and reactive to light.  Cardiovascular:     Rate and Rhythm: Normal rate and regular rhythm.     Heart sounds: No murmur. No friction rub. No gallop.   Pulmonary:     Effort: Pulmonary effort is normal.     Breath sounds: No wheezing or rales.  Abdominal:     General:  There is no distension.     Palpations: Abdomen is soft.     Tenderness: There is abdominal tenderness (tenderness to the epigastrium and RUQ).  Musculoskeletal:        General: No tenderness.     Cervical back: Normal range of motion and neck supple.  Skin:    General: Skin is warm and dry.  Neurological:     Mental Status: She is alert and oriented to person, place, and time.  Psychiatric:        Behavior: Behavior normal.     ED Results / Procedures / Treatments   Labs (all labs ordered are listed, but only abnormal results are displayed) Labs Reviewed  COMPREHENSIVE METABOLIC PANEL - Abnormal; Notable for the following components:      Result Value   Sodium 134 (*)    Chloride 94 (*)    Glucose, Bld 206 (*)    Creatinine, Ser 1.15 (*)    GFR calc non Af Amer 49 (*)    GFR calc Af Amer 56 (*)    Anion  gap 16 (*)    All other components within normal limits  D-DIMER, QUANTITATIVE (NOT AT [facility]) - Abnormal; Notable for the following components:   D-Dimer, Quant 2.71 (*)    All other components within normal limits  CBC WITH DIFFERENTIAL/PLATELET  LIPASE, BLOOD  TROPONIN I (HIGH SENSITIVITY)  TROPONIN I (HIGH SENSITIVITY)    EKG None  Radiology DG Chest 2 View  Result Date: 12/02/2019 CLINICAL DATA:  Pt c/o chest pain x 1 day. Hx of CVA, DVT, GERD, AND HTN. Pt is a nonsmoker. EXAM: CHEST - 2 VIEW COMPARISON:  Chest radiograph 02/25/2019 FINDINGS: Stable cardiomediastinal contours. The lungs are clear. No pneumothorax or pleural effusion. There is chronic wedging of a lower thoracic vertebral body. Multilevel degenerative changes in the thoracic spine. IMPRESSION: No acute cardiopulmonary process. Electronically Signed   By: Audie Pinto M.D.   On: 12/02/2019 15:48   CT Angio Chest PE W and/or Wo Contrast  Result Date: 12/02/2019 CLINICAL DATA:  Shortness of breath and chest pain with right-sided abdominal pain EXAM: CT ANGIOGRAPHY CHEST CT ABDOMEN AND PELVIS WITH CONTRAST TECHNIQUE: Multidetector CT imaging of the chest was performed using the standard protocol during bolus administration of intravenous contrast. Multiplanar CT image reconstructions and MIPs were obtained to evaluate the vascular anatomy. Multidetector CT imaging of the abdomen and pelvis was performed using the standard protocol during bolus administration of intravenous contrast. CONTRAST:  119mL OMNIPAQUE IOHEXOL 350 MG/ML SOLN COMPARISON:  12/15/2018, 06/17/2005 FINDINGS: CTA CHEST FINDINGS Cardiovascular: Thoracic aorta shows normal enhancement with atherosclerotic calcifications identified. No aneurysmal dilatation or dissection seen. No coronary calcifications are identified. The pulmonary artery shows a normal branching pattern. No filling defects to suggest pulmonary emboli are noted. Mediastinum/Nodes: Thoracic  inlet is within normal limits. No hilar or mediastinal adenopathy is noted. The esophagus is within normal. Lungs/Pleura: Lungs are well aerated bilaterally. Areas of air trapping are identified as well as mild emphysematous changes. Some scarring is seen within the lingula stable from previous exam. A 6-7 mm sub solid nodule is noted within the left upper lobe along the major fissure best seen on images 46 of series 6, 133 of series 9 and 93 of series 8. When compared to the CT from 2006 it measured approximately 3-4 mm. No other focal nodules are seen. No sizable effusion or pneumothorax is noted. Musculoskeletal: Degenerative changes of the thoracic spine are seen. No acute  rib abnormality is noted. Review of the MIP images confirms the above findings. CT ABDOMEN and PELVIS FINDINGS Hepatobiliary: No focal liver abnormality is seen. Status post cholecystectomy. No biliary dilatation. Pancreas: Unremarkable. No pancreatic ductal dilatation or surrounding inflammatory changes. Spleen: Normal in size without focal abnormality. Adrenals/Urinary Tract: Adrenal glands are unremarkable. No renal calculi are identified. No obstructive changes are seen. Normal excretion on delayed images is noted. The ureters are within normal limits. The bladder is well distended. Stomach/Bowel: Postsurgical changes are noted in the right colon similar to that seen on the prior exam. Stomach is decompressed. No small bowel dilatation is seen. No obstructive changes are noted. Vascular/Lymphatic: Aortic atherosclerosis. No enlarged abdominal or pelvic lymph nodes. Reproductive: Uterus and bilateral adnexa are unremarkable. Other: No abdominal wall hernia or abnormality. No abdominopelvic ascites. Musculoskeletal: Degenerative and postoperative changes are noted in the lumbar spine. Stable T12 compression deformity is noted. Review of the MIP images confirms the above findings. IMPRESSION: CTA of the chest: No evidence of pulmonary  embolism. Sub solid nodule within the left upper lobe as described. Initial follow-up with CT at 6-12 months is recommended to confirm persistence. If persistent, repeat CT is recommended every 2 years until 5 years of stability has been established. This recommendation follows the consensus statement: Guidelines for Management of Incidental Pulmonary Nodules Detected on CT Images: From the Fleischner Society 2017; Radiology 2017; 284:228-243. Mild emphysematous changes. CT of the abdomen and pelvis: Chronic changes without acute abnormality. Aortic Atherosclerosis (ICD10-I70.0) and Emphysema (ICD10-J43.9). Electronically Signed   By: Inez Catalina M.D.   On: 12/02/2019 17:49   CT ABDOMEN PELVIS W CONTRAST  Result Date: 12/02/2019 CLINICAL DATA:  Shortness of breath and chest pain with right-sided abdominal pain EXAM: CT ANGIOGRAPHY CHEST CT ABDOMEN AND PELVIS WITH CONTRAST TECHNIQUE: Multidetector CT imaging of the chest was performed using the standard protocol during bolus administration of intravenous contrast. Multiplanar CT image reconstructions and MIPs were obtained to evaluate the vascular anatomy. Multidetector CT imaging of the abdomen and pelvis was performed using the standard protocol during bolus administration of intravenous contrast. CONTRAST:  134mL OMNIPAQUE IOHEXOL 350 MG/ML SOLN COMPARISON:  12/15/2018, 06/17/2005 FINDINGS: CTA CHEST FINDINGS Cardiovascular: Thoracic aorta shows normal enhancement with atherosclerotic calcifications identified. No aneurysmal dilatation or dissection seen. No coronary calcifications are identified. The pulmonary artery shows a normal branching pattern. No filling defects to suggest pulmonary emboli are noted. Mediastinum/Nodes: Thoracic inlet is within normal limits. No hilar or mediastinal adenopathy is noted. The esophagus is within normal. Lungs/Pleura: Lungs are well aerated bilaterally. Areas of air trapping are identified as well as mild emphysematous  changes. Some scarring is seen within the lingula stable from previous exam. A 6-7 mm sub solid nodule is noted within the left upper lobe along the major fissure best seen on images 46 of series 6, 133 of series 9 and 93 of series 8. When compared to the CT from 2006 it measured approximately 3-4 mm. No other focal nodules are seen. No sizable effusion or pneumothorax is noted. Musculoskeletal: Degenerative changes of the thoracic spine are seen. No acute rib abnormality is noted. Review of the MIP images confirms the above findings. CT ABDOMEN and PELVIS FINDINGS Hepatobiliary: No focal liver abnormality is seen. Status post cholecystectomy. No biliary dilatation. Pancreas: Unremarkable. No pancreatic ductal dilatation or surrounding inflammatory changes. Spleen: Normal in size without focal abnormality. Adrenals/Urinary Tract: Adrenal glands are unremarkable. No renal calculi are identified. No obstructive changes are seen. Normal excretion  on delayed images is noted. The ureters are within normal limits. The bladder is well distended. Stomach/Bowel: Postsurgical changes are noted in the right colon similar to that seen on the prior exam. Stomach is decompressed. No small bowel dilatation is seen. No obstructive changes are noted. Vascular/Lymphatic: Aortic atherosclerosis. No enlarged abdominal or pelvic lymph nodes. Reproductive: Uterus and bilateral adnexa are unremarkable. Other: No abdominal wall hernia or abnormality. No abdominopelvic ascites. Musculoskeletal: Degenerative and postoperative changes are noted in the lumbar spine. Stable T12 compression deformity is noted. Review of the MIP images confirms the above findings. IMPRESSION: CTA of the chest: No evidence of pulmonary embolism. Sub solid nodule within the left upper lobe as described. Initial follow-up with CT at 6-12 months is recommended to confirm persistence. If persistent, repeat CT is recommended every 2 years until 5 years of stability has  been established. This recommendation follows the consensus statement: Guidelines for Management of Incidental Pulmonary Nodules Detected on CT Images: From the Fleischner Society 2017; Radiology 2017; 284:228-243. Mild emphysematous changes. CT of the abdomen and pelvis: Chronic changes without acute abnormality. Aortic Atherosclerosis (ICD10-I70.0) and Emphysema (ICD10-J43.9). Electronically Signed   By: Inez Catalina M.D.   On: 12/02/2019 17:49    Procedures Procedures (including critical care time)  Medications Ordered in ED Medications  sodium chloride 0.9 % bolus 1,000 mL (0 mLs Intravenous Stopped 12/02/19 1822)  ondansetron (ZOFRAN) injection 4 mg (4 mg Intravenous Given 12/02/19 1604)  morphine 4 MG/ML injection 4 mg (4 mg Intravenous Given 12/02/19 1604)  diphenhydrAMINE (BENADRYL) injection 25 mg (25 mg Intravenous Given 12/02/19 1604)  iohexol (OMNIPAQUE) 350 MG/ML injection 100 mL (100 mLs Intravenous Contrast Given 12/02/19 1657)  ondansetron (ZOFRAN) injection 4 mg (4 mg Intravenous Given 12/02/19 1815)    ED Course  I have reviewed the triage vital signs and the nursing notes.  Pertinent labs & imaging results that were available during my care of the patient were reviewed by me and considered in my medical decision making (see chart for details).    MDM Rules/Calculators/A&P                      69 yo F with a cc of ruq abdominal pain. Radiates to the R shoulder.  Broad ddx.  Hx of PE, though doesn't know what that felt like before.  Will obtain labs, cxr, ddimer.  Low threshold to CT CAP.  D-dimer is elevated.  CT scan of the chest without acute pathology.  No pulmonary embolism.  CT of the abdomen pelvis also without pathology.  Patient is feeling better and tolerating p.o. on reassessment.  Still feels some discomfort to her epigastrium.  Question reflux.  She has a history of this and is on dual therapy.  We will have her watch what she eats and follow-up with her  gastroenterologist.  6:31 PM:  I have discussed the diagnosis/risks/treatment options with the patient and family and believe the pt to be eligible for discharge home to follow-up with PCP, GI. We also discussed returning to the ED immediately if new or worsening sx occur. We discussed the sx which are most concerning (e.g., sudden worsening pain, fever, inability to tolerate by mouth) that necessitate immediate return. Medications administered to the patient during their visit and any new prescriptions provided to the patient are listed below.  Medications given during this visit Medications  sodium chloride 0.9 % bolus 1,000 mL (0 mLs Intravenous Stopped 12/02/19 1822)  ondansetron (ZOFRAN)  injection 4 mg (4 mg Intravenous Given 12/02/19 1604)  morphine 4 MG/ML injection 4 mg (4 mg Intravenous Given 12/02/19 1604)  diphenhydrAMINE (BENADRYL) injection 25 mg (25 mg Intravenous Given 12/02/19 1604)  iohexol (OMNIPAQUE) 350 MG/ML injection 100 mL (100 mLs Intravenous Contrast Given 12/02/19 1657)  ondansetron (ZOFRAN) injection 4 mg (4 mg Intravenous Given 12/02/19 1815)     The patient appears reasonably screen and/or stabilized for discharge and I doubt any other medical condition or other [facility] [facility] requiring further screening, evaluation, or treatment in the ED at this time prior to discharge.     Final Clinical Impression(s) / ED Diagnoses Final diagnoses:  Epigastric abdominal pain    Rx / DC Orders ED Discharge Orders         Ordered    ondansetron (ZOFRAN ODT) 4 MG disintegrating tablet  Every 8 hours PRN     12/02/19 1829    morphine (MSIR) 15 MG tablet  Every 4 hours PRN     12/02/19 [place], [place], DO 12/02/19 1831

## 2019-12-06 DIAGNOSIS — K219 Gastro-esophageal reflux disease without esophagitis: Secondary | ICD-10-CM | POA: Diagnosis not present

## 2019-12-06 DIAGNOSIS — R079 Chest pain, unspecified: Secondary | ICD-10-CM | POA: Diagnosis not present

## 2019-12-06 DIAGNOSIS — R112 Nausea with vomiting, unspecified: Secondary | ICD-10-CM | POA: Diagnosis not present

## 2019-12-06 DIAGNOSIS — Z8719 Personal history of other diseases of the digestive system: Secondary | ICD-10-CM | POA: Diagnosis not present

## 2019-12-09 ENCOUNTER — Ambulatory Visit: Payer: Medicare Other | Admitting: Family Medicine

## 2019-12-09 DIAGNOSIS — Z23 Encounter for immunization: Secondary | ICD-10-CM | POA: Diagnosis not present

## 2019-12-17 ENCOUNTER — Encounter: Payer: Self-pay | Admitting: Cardiology

## 2019-12-17 ENCOUNTER — Other Ambulatory Visit: Payer: Self-pay

## 2019-12-17 ENCOUNTER — Ambulatory Visit (INDEPENDENT_AMBULATORY_CARE_PROVIDER_SITE_OTHER): Payer: Medicare Other | Admitting: Cardiology

## 2019-12-17 VITALS — BP 138/72 | HR 102 | Ht 63.0 in | Wt 208.1 lb

## 2019-12-17 DIAGNOSIS — I10 Essential (primary) hypertension: Secondary | ICD-10-CM | POA: Diagnosis not present

## 2019-12-17 DIAGNOSIS — N1831 Chronic kidney disease, stage 3a: Secondary | ICD-10-CM | POA: Diagnosis not present

## 2019-12-17 DIAGNOSIS — R079 Chest pain, unspecified: Secondary | ICD-10-CM | POA: Diagnosis not present

## 2019-12-17 DIAGNOSIS — R072 Precordial pain: Secondary | ICD-10-CM

## 2019-12-17 DIAGNOSIS — R931 Abnormal findings on diagnostic imaging of heart and coronary circulation: Secondary | ICD-10-CM | POA: Diagnosis not present

## 2019-12-17 MED ORDER — METOPROLOL TARTRATE 100 MG PO TABS
100.0000 mg | ORAL_TABLET | Freq: Once | ORAL | 0 refills | Status: DC
Start: 2019-12-17 — End: 2020-01-10

## 2019-12-17 NOTE — Addendum Note (Signed)
Addended by: Antonieta Iba on: 12/17/2019 02:33 PM   Modules accepted: Orders

## 2019-12-17 NOTE — Progress Notes (Signed)
Cardiology Office Note:    Date:  12/17/2019   ID:  [name], DOB Nov 12, 1950, MRN [id number]  PCP:  Harlan Stains, MD  Cardiologist:  Fransico Him, MD    Referring MD: Harlan Stains, MD   Chief Complaint  Patient presents with  . Hypertension  . Chest Pain    History of Present Illness:    [name] is a 69 y.o. female with a hx of  DM2, hypothyroidism, HTN, depression, GERD, CKD stage 3 and fibromyalgia who recently saw her PCP due to some problems with feeling lightheaded and weak.  One day she fel lightheaded after getting out of the shower and had to lay down for 20 minutes and then went back to the bathroom and saw colors in her vision and was off balance.  Her BP had been running soft so her PCP cut her Olmesartan back.  She was noted to have frequent PACs on that EKG and was referred to Cardiology for further evaluation.    When I saw her she had just had a  URI and had had some problems with dizziness and lightheadedness.  She has  episodes where all of a sudden she would get a wave where she got a rush down her body and then felt very dizzy and had to lay down.  BP at that time was 163/66mmHg.  She has not had any times when her BP was low.  She has had about 4 episodes.  She has been under a lot of stress as she has been the primary caregiver for her mom who died a few weeks ago and has had do deal with a lot of issues.    A 2D echo showed normal LVF in 09/2017.  She was then admitted with a CVA and TEE showed normal LVF with mild MR and AR .  Event monitor was done showing PACs.  I have not seen her since then.    She was seen in the ER on 12/02/2019 with chest pain while driving her car.  The pain was midsternal and radiated to the right sided and radiated under her right breast and into her back on the right and into her jaw.  There was no associated SOB or diaphoresis but has some nausea.  It was worse with deep breathing and the next day her sternum was sore the  entire day.  She was seen back by GI and started on Carafate and instructed to followup with Cardiology.  She tells me that she has had some problems with back pain and intermittent numbness in the right arm from the elbow down.  She has never smoked and no fm hx of MI. Her workup in the ER was normal with hsTrop 6>6 and EKG showed NSR with septal infarct and low voltage QRS.  She is here today for followup and is doing well.    SInce that ER visit she has had some milder episodes of pain but nothing like what she had that day.  They are all nonexertional.  She denies any  PND, orthopnea, LE edema,  palpitations or syncope. SHe is compliant with her meds and is tolerating meds with no SE.    Past Medical History:  Diagnosis Date  . Anemia    iron deficiency  . Arthritis   . Blood transfusion   . Cervical spondylosis with myelopathy and radiculopathy   . CVA (cerebral vascular accident) ([place])    11/2017  . Depression   .  Diabetes mellitus    Type II  . Diastolic dysfunction   . Diverticulitis   . DJD (degenerative joint disease)   . DVT (deep venous thrombosis) ([facility])    times 2 lower leg  . Endometriosis   . Family history of adverse reaction to anesthesia    mom and dad PONV  . Fibromyalgia   . GERD (gastroesophageal reflux disease)    occ  . Hiatal hernia   . History of kidney stones   . Hyperlipidemia   . Hypertension   . Hypothyroidism   . Insomnia   . Mild aortic insufficiency    by echo 04/2013  . Osteoarthritis    back and knee  . Ovarian cyst   . PONV (postoperative nausea and vomiting)   . RLS (restless legs syndrome)   . Thoracic compression fracture ([place])   . Uterine fibroid   . UTI (lower urinary tract infection)   . Villous adenoma of right colon 04/08/2016  . Vitamin D deficiency disease     Past Surgical History:  Procedure Laterality Date  . ANTERIOR CERVICAL DECOMP/DISCECTOMY FUSION N/A 03/23/2017   Procedure: ANTERIOR CERVICAL DECOMPRESSION/DISCECTOMY  FUSION, INTERBODY PROSTHESIS,PLATE CERVICAL THREE- CERVICAL FOUR, CERVICAL FOUR- CERVICAL FIVE, CERVICAL FIVE- CERVICAL SIX;  Surgeon: Newman Pies, MD;  Location: [place];  Service: Neurosurgery;  Laterality: N/A;  . ANTERIOR CERVICAL DECOMP/DISCECTOMY FUSION N/A 04/18/2019   Procedure: ANTERIOR CERVICAL DECOMPRESSION/DISCECTOMY  CERVICAL SIX- CERVICAL SEVEN;  Surgeon: Newman Pies, MD;  Location: [place];  Service: Neurosurgery;  Laterality: N/A;  anterior  . APPENDECTOMY    . BACK SURGERY  ,2005   April  2013 - spinal fusion@ [facility]  . bowel blockage surgery    . BREAST SURGERY     breast reduction  . CARDIAC CATHETERIZATION  04/2013   normal coronary arteries and normal LVF  . CARPAL TUNNEL RELEASE  06/05/2012   Procedure: CARPAL TUNNEL RELEASE;  Surgeon: Cammie Sickle., MD;  Location: [place];  Service: Orthopedics;  Laterality: Left;  . CESAREAN SECTION     x 2  . CHOLECYSTECTOMY    . COLECTOMY  04/08/2016  . DILATION AND CURETTAGE OF UTERUS    . DORSAL COMPARTMENT RELEASE  06/05/2012   Procedure: RELEASE DORSAL COMPARTMENT (DEQUERVAIN);  Surgeon: Cammie Sickle., MD;  Location: [facility];  Service: Orthopedics;  Laterality: Left;  Excision of mixoid cyst also  . EYE SURGERY     cataracts bilateral  . JOINT REPLACEMENT     Bilateral knee  . KNEE ARTHROPLASTY  09   lft partial  . KNEE ARTHROPLASTY     rt  . LAPAROSCOPIC PARTIAL COLECTOMY N/A 04/08/2016   Procedure: LAPAROSCOPIC ASSISTED ASCENDING COLECTOMY POSSIBLE OPEN COLECTOMY;  Surgeon: Fanny Skates, MD;  Location: [place];  Service: General;  Laterality: N/A;  . orthopedic surgeries     multiple  . REDUCTION MAMMAPLASTY Bilateral   . SHOULDER OPEN ROTATOR CUFF REPAIR     rt and lft  . TEE WITHOUT CARDIOVERSION N/A 11/23/2017   Procedure: TRANSESOPHAGEAL ECHOCARDIOGRAM (TEE);  Surgeon: Jerline Pain, MD;  Location: [facility] ENDOSCOPY;  Service: Cardiovascular;  Laterality: N/A;  . TUBAL  LIGATION     btsp    Current Medications: Current Meds  Medication Sig  . acetaminophen (TYLENOL) 500 MG tablet Take 1,000 mg by mouth every 8 (eight) hours as needed for mild pain or headache.  Marland Kitchen amLODipine (NORVASC) 2.5 MG tablet Take 5 mg by mouth  daily.   . aspirin EC 81 MG tablet Take 81 mg by mouth daily.  . Cholecalciferol (VITAMIN D3) 10 MCG (400 UNIT) CHEW Chew by mouth. OTC  . clobetasol (TEMOVATE) 0.05 % external solution Apply 1 application topically. 5 days per week as needed.  . Cyanocobalamin (B-12) 2500 MCG TABS Take 2,500 mcg by mouth daily.  . cyclobenzaprine (FLEXERIL) 10 MG tablet Take 1 tablet (10 mg total) by mouth 3 (three) times daily as needed for muscle spasms.  Marland Kitchen dexlansoprazole (DEXILANT) 60 MG capsule Take 60 mg by mouth daily.  Marland Kitchen docusate sodium (COLACE) 100 MG capsule Take 1 capsule (100 mg total) by mouth 2 (two) times daily.  . DULoxetine (CYMBALTA) 60 MG capsule Take 60 mg by mouth daily.   Eduard Roux 140 MG/ML SOAJ Inject 140 mg into the skin every 30 (thirty) days.  . ferrous sulfate 325 (65 FE) MG tablet Take 1 tablet (325 mg total) by mouth 2 (two) times daily with a meal. (Patient taking differently: Take 325 mg by mouth daily with breakfast. )  . fluticasone (FLONASE) 50 MCG/ACT nasal spray Place 2 sprays into both nostrils daily as needed for allergies or rhinitis.  . Fremanezumab-vfrm (AJOVY) 225 MG/1.5ML SOAJ Inject 225 mg into the skin every 30 (thirty) days.  . furosemide (LASIX) 20 MG tablet Take 20 mg by mouth daily as needed for fluid or edema.   . gabapentin (NEURONTIN) 300 MG capsule Take 600 mg by mouth at bedtime.   . ibandronate (BONIVA) 150 MG tablet Take 150 mg by mouth every 30 (thirty) days.  Marland Kitchen ibuprofen (ADVIL) 200 MG tablet Take 200 mg by mouth 3 (three) times daily as needed for headache (pain).   . insulin glargine (LANTUS) 100 UNIT/ML injection Inject 40 Units into the skin daily. Takes in am  . insulin lispro (HUMALOG  KWIKPEN) 100 UNIT/ML KwikPen Inject 12 Units into the skin 3 (three) times daily.   . irbesartan (AVAPRO) 300 MG tablet Take 300 mg by mouth daily.   Marland Kitchen levothyroxine (SYNTHROID, LEVOTHROID) 150 MCG tablet Take 150 mcg by mouth daily before breakfast.  . LORazepam (ATIVAN) 0.5 MG tablet TAKE 1 TABLET AS NEEDED FOR ANXIETY EVERY 8 HRS  . Magnesium 100 MG CAPS Take by mouth. OTC  . meclizine (ANTIVERT) 12.5 MG tablet Take 25 mg by mouth 3 (three) times daily as needed for dizziness.  Marland Kitchen morphine (MSIR) 15 MG tablet Take 0.5 tablets (7.5 mg total) by mouth every 4 (four) hours as needed for severe pain.  Marland Kitchen ondansetron (ZOFRAN ODT) 4 MG disintegrating tablet Take 1 tablet (4 mg total) by mouth every 8 (eight) hours as needed for nausea or vomiting.  Marland Kitchen oxyCODONE-acetaminophen (PERCOCET/ROXICET) 5-325 MG tablet Take 1 tablet by mouth every 4 (four) hours as needed (pain). (Patient taking differently: Take 1 tablet by mouth every 6 (six) hours as needed (pain). )  . pantoprazole (PROTONIX) 40 MG tablet Take 40 mg by mouth daily.  . Probiotic Product (PROBIOTIC PO) Take 1 capsule by mouth daily.  . promethazine (PHENERGAN) 25 MG tablet Take 25 mg by mouth every 8 (eight) hours as needed for nausea or vomiting.  . sucralfate (CARAFATE) 1 GM/10ML suspension Take 1 g by mouth 4 (four) times daily.  . temazepam (RESTORIL) 15 MG capsule Take 15 mg by mouth at bedtime.      Allergies:   Codeine, Ambien [zolpidem tartrate], Crestor [rosuvastatin], Lipitor [atorvastatin], Morphine and related, Penicillins, Statins, Suvorexant, Dilaudid [hydromorphone hcl], Heparin, Amitriptyline,  Ceftin [cefuroxime axetil], Lovaza [omega-3-acid ethyl esters], Lunesta [eszopiclone], Lyrica [pregabalin], and Metformin and related   Social History   Socioeconomic History  . Marital status: Married    Spouse name: Not on file  . Number of children: 2  . Years of education: trade school  . Highest education level: Not on file   Occupational History  . Not on file  Tobacco Use  . Smoking status: Never Smoker  . Smokeless tobacco: Never Used  Substance and Sexual Activity  . Alcohol use: Never  . Drug use: Never  . Sexual activity: Yes    Birth control/protection: Post-menopausal, Surgical  Other Topics Concern  . Not on file  Social History Narrative   Lives at home with her husband   Right handed   Caffeine: 2-3 cups of coffee/day   Social Determinants of Health   Financial Resource Strain:   . Difficulty of Paying Living Expenses:   Food Insecurity:   . Worried About Charity fundraiser in the Last Year:   . Arboriculturist in the Last Year:   Transportation Needs:   . Film/video editor (Medical):   Marland Kitchen Lack of Transportation (Non-Medical):   Physical Activity:   . Days of Exercise per Week:   . Minutes of Exercise per Session:   Stress:   . Feeling of Stress :   Social Connections:   . Frequency of Communication with Friends and Family:   . Frequency of Social Gatherings with Friends and Family:   . Attends Religious Services:   . Active Member of Clubs or Organizations:   . Attends Archivist Meetings:   Marland Kitchen Marital Status:      Family History: The patient's family history includes Breast cancer in her cousin, cousin, maternal aunt, and maternal aunt; Breast cancer (age of onset: 34) in her mother; Dementia in her mother; Diabetes in her brother; Diabetes type II in her father and mother; Heart attack in her brother and father; High blood pressure in an other family member; Hypertension in her mother; Hypothyroidism in her brother and father; Kidney failure in her father; Lung cancer in her brother.  ROS:   Please see the history of present illness.    ROS  All other systems reviewed and negative.   EKGs/Labs/Other Studies Reviewed:    The following studies were reviewed today: 2D echo 2019, event monitor 2019, TEE 2019  EKG:  EKG is not ordered today.    Recent Labs:  12/21/2018: Magnesium 1.6 07/10/2019: TSH 1.310 12/02/2019: ALT 15; BUN 16; Creatinine, Ser 1.15; Hemoglobin 14.0; Platelets 302; Potassium 3.8; Sodium 134   Recent Lipid Panel    Component Value Date/Time   CHOL 92 11/22/2017 0800   TRIG 159 (H) 11/22/2017 0800   HDL 25 (L) 11/22/2017 0800   CHOLHDL 3.7 11/22/2017 0800   VLDL 32 11/22/2017 0800   LDLCALC 35 11/22/2017 0800    Physical Exam:    VS:  BP 138/72   Pulse (!) 102   Ht 5\' 3"  (1.6 m)   Wt 208 lb 1.9 oz (94.4 kg)   SpO2 99%   BMI 36.87 kg/m     Wt Readings from Last 3 Encounters:  12/17/19 208 lb 1.9 oz (94.4 kg)  09/10/19 211 lb 12.8 oz (96.1 kg)  07/10/19 212 lb (96.2 kg)     GEN:  Well nourished, well developed in no acute distress HEENT: Normal NECK: No JVD; No carotid bruits LYMPHATICS: No lymphadenopathy CARDIAC:  RRR, no murmurs, rubs, gallops RESPIRATORY:  Clear to auscultation without rales, wheezing or rhonchi  ABDOMEN: Soft, non-tender, non-distended MUSCULOSKELETAL:  No edema; No deformity  SKIN: Warm and dry NEUROLOGIC:  Alert and oriented x 3 PSYCHIATRIC:  Normal affect   ASSESSMENT:    1. Chest pain of uncertain etiology   2. Essential hypertension   3. Stage 3a chronic kidney disease   4. Precordial pain   5. Abnormal findings on diagnostic imaging of heart and coronary circulation     PLAN:    In order of problems listed above:  1.  Chest pain -it is atypical in that it is right sided and radiates under her right breast into her back along with right arm pain.   -she has a hx of back problems and has been having some numbness of her right elbow down to her hand -her pain is nonexertional but associated with nausea -? Whether this is GERD with esophageal spasm  - her GB is out -I will get a coronary CTA to rule out CAD  2.  HTN -BP controlled -continue amlodipine 2.5mg  daily and Irbesartan 300mg  daily  3.  DM with [id number] -followed by PCP   Medication Adjustments/Labs and Tests  Ordered: Current medicines are reviewed at length with the patient today.  Concerns regarding medicines are outlined above.  Orders Placed This Encounter  Procedures  . CT CORONARY MORPH W/CTA COR W/SCORE W/CA W/CM &/OR WO/CM  . CT CORONARY FRACTIONAL FLOW RESERVE DATA PREP  . CT CORONARY FRACTIONAL FLOW RESERVE FLUID ANALYSIS   Meds ordered this encounter  Medications  . metoprolol tartrate (LOPRESSOR) 100 MG tablet    Sig: Take 1 tablet (100 mg total) by mouth once for 1 dose. Take 1 tablet (100 mg total) two hours prior to CT scan.    Dispense:  1 tablet    Refill:  0    Signed, Fransico Him, MD  12/17/2019 2:24 PM    [place]

## 2019-12-17 NOTE — Patient Instructions (Addendum)
Medication Instructions:  Your physician recommends that you continue on your current medications as directed. Please refer to the Current Medication list given to you today.  *If you need a refill on your cardiac medications before your next appointment, please call your pharmacy*   Follow-Up: At [facility], you and your health needs are our priority.  As part of our continuing mission to provide you with exceptional heart care, we have created designated Provider Care Teams.  These Care Teams include your primary Cardiologist (physician) and Advanced Practice Providers (APPs -  Physician Assistants and Nurse Practitioners) who all work together to provide you with the care you need, when you need it.  We recommend signing up for the patient portal called "MyChart".  Sign up information is provided on this After Visit Summary.  MyChart is used to connect with patients for Virtual Visits (Telemedicine).  Patients are able to view lab/test results, encounter notes, upcoming appointments, etc.  Non-urgent messages can be sent to your provider as well.   To learn more about what you can do with MyChart, go to [email or web address].    Follow up with Dr. Radford Pax as needed based on results from your tests.  Other Instructions Your cardiac CT will be scheduled at:   [facility] [address] [place], [place] [address] [phone]   Please arrive at the [facility] main entrance of [facility] 30 minutes prior to test start time. Proceed to the [facility] Department (first floor) to check-in and test prep.   Please follow these instructions carefully (unless otherwise directed):  On the Night Before the Test: . Be sure to Drink plenty of water. . Do not consume any caffeinated/decaffeinated beverages or chocolate 12 hours prior to your test. . Do not take any antihistamines 12 hours prior to your test. . If you take Metformin do not take 24 hours prior  to test.  On the Day of the Test: . Drink plenty of water. Do not drink any water within one hour of the test. . Do not eat any food 4 hours prior to the test. . You may take your regular medications prior to the test.  . Take metoprolol (Lopressor) two hours prior to test. . HOLD Furosemide/Hydrochlorothiazide morning of the test. . FEMALES- please wear underwire-free bra if available      After the Test: . Drink plenty of water. . After receiving IV contrast, you may experience a mild flushed feeling. This is normal. . On occasion, you may experience a mild rash up to 24 hours after the test. This is not dangerous. If this occurs, you can take Benadryl 25 mg and increase your fluid intake. . If you experience trouble breathing, this can be serious. If it is severe call 911 IMMEDIATELY. If it is mild, please call our office. . If you take any of these medications: Glipizide/Metformin, Avandament, Glucavance, please do not take 48 hours after completing test unless otherwise instructed.   Once we have confirmed authorization from your insurance company, we will call you to set up a date and time for your test.   For non-scheduling related questions, please contact the cardiac imaging nurse navigator should you have any questions/concerns: Marchia Bond, RN Navigator Cardiac Imaging [facility] [phone] office  For scheduling needs, including cancellations and rescheduling, please call [phone].

## 2019-12-18 ENCOUNTER — Ambulatory Visit
Admission: RE | Admit: 2019-12-18 | Discharge: 2019-12-18 | Disposition: A | Discharge status: Home / Self Care | Payer: Medicare Other | Source: Ambulatory Visit | Attending: Neurosurgery | Admitting: Neurosurgery

## 2019-12-18 DIAGNOSIS — M5442 Lumbago with sciatica, left side: Secondary | ICD-10-CM

## 2019-12-18 DIAGNOSIS — M5116 Intervertebral disc disorders with radiculopathy, lumbar region: Secondary | ICD-10-CM | POA: Diagnosis not present

## 2019-12-18 MED ORDER — GADOBENATE DIMEGLUMINE 529 MG/ML IV SOLN
19.0000 mL | Freq: Once | INTRAVENOUS | Status: AC | PRN
  Administered 2019-12-18: 19 mL via INTRAVENOUS

## 2019-12-20 ENCOUNTER — Ambulatory Visit: Payer: Medicare Other

## 2020-01-02 ENCOUNTER — Other Ambulatory Visit: Payer: Medicare Other

## 2020-01-08 DIAGNOSIS — E039 Hypothyroidism, unspecified: Secondary | ICD-10-CM | POA: Diagnosis not present

## 2020-01-08 DIAGNOSIS — Z794 Long term (current) use of insulin: Secondary | ICD-10-CM | POA: Diagnosis not present

## 2020-01-08 DIAGNOSIS — E1065 Type 1 diabetes mellitus with hyperglycemia: Secondary | ICD-10-CM | POA: Diagnosis not present

## 2020-01-10 ENCOUNTER — Telehealth: Payer: Self-pay | Admitting: Cardiology

## 2020-01-10 ENCOUNTER — Telehealth (HOSPITAL_COMMUNITY): Payer: Self-pay | Admitting: *Deleted

## 2020-01-10 ENCOUNTER — Other Ambulatory Visit (HOSPITAL_COMMUNITY): Payer: Self-pay | Admitting: *Deleted

## 2020-01-10 DIAGNOSIS — M545 Low back pain: Secondary | ICD-10-CM | POA: Diagnosis not present

## 2020-01-10 DIAGNOSIS — M542 Cervicalgia: Secondary | ICD-10-CM | POA: Diagnosis not present

## 2020-01-10 MED ORDER — METOPROLOL TARTRATE 100 MG PO TABS: 100.0000 mg | ORAL_TABLET | Freq: Once | ORAL | 0 refills | Status: DC

## 2020-01-10 NOTE — Telephone Encounter (Signed)
Reaching out to patient to offer assistance regarding upcoming cardiac imaging study; pt verbalizes understanding of appt date/time, parking situation and where to check in, pre-test NPO status and medications ordered, and verified current allergies; name and call back number provided for further questions should they arise  [facility] [phone] office [phone] cell  Pt encouraged to take her Ativan and to have a driver.  Pt expressed understanding.

## 2020-01-10 NOTE — Telephone Encounter (Signed)
Transferred call to Romie Minus

## 2020-01-13 ENCOUNTER — Ambulatory Visit (HOSPITAL_COMMUNITY)
Admission: RE | Admit: 2020-01-13 | Discharge: 2020-01-13 | Disposition: A | Discharge status: Home / Self Care | Payer: Medicare Other | Source: Ambulatory Visit | Attending: Cardiology | Admitting: Cardiology

## 2020-01-13 ENCOUNTER — Other Ambulatory Visit: Payer: Self-pay

## 2020-01-13 DIAGNOSIS — R072 Precordial pain: Secondary | ICD-10-CM

## 2020-01-13 DIAGNOSIS — R931 Abnormal findings on diagnostic imaging of heart and coronary circulation: Secondary | ICD-10-CM

## 2020-01-13 MED ORDER — NITROGLYCERIN 0.4 MG SL SUBL
SUBLINGUAL_TABLET | SUBLINGUAL | Status: AC
  Filled 2020-01-13: qty 2

## 2020-01-13 MED ORDER — IOHEXOL 350 MG/ML SOLN
80.0000 mL | Freq: Once | INTRAVENOUS | Status: AC | PRN
  Administered 2020-01-13: 80 mL via INTRAVENOUS

## 2020-01-13 MED ORDER — NITROGLYCERIN 0.4 MG SL SUBL
0.8000 mg | SUBLINGUAL_TABLET | Freq: Once | SUBLINGUAL | Status: AC
  Administered 2020-01-13: 0.8 mg via SUBLINGUAL

## 2020-01-14 ENCOUNTER — Ambulatory Visit: Payer: Medicare Other | Admitting: Family Medicine

## 2020-01-14 DIAGNOSIS — R072 Precordial pain: Secondary | ICD-10-CM | POA: Diagnosis not present

## 2020-01-14 DIAGNOSIS — R931 Abnormal findings on diagnostic imaging of heart and coronary circulation: Secondary | ICD-10-CM | POA: Diagnosis not present

## 2020-01-14 DIAGNOSIS — Z6836 Body mass index (BMI) 36.0-36.9, adult: Secondary | ICD-10-CM | POA: Diagnosis not present

## 2020-01-15 ENCOUNTER — Telehealth: Payer: Self-pay | Admitting: Emergency Medicine

## 2020-01-15 ENCOUNTER — Telehealth: Payer: Self-pay

## 2020-01-15 DIAGNOSIS — E785 Hyperlipidemia, unspecified: Secondary | ICD-10-CM

## 2020-01-15 DIAGNOSIS — I10 Essential (primary) hypertension: Secondary | ICD-10-CM

## 2020-01-15 MED ORDER — METOPROLOL SUCCINATE ER 25 MG PO TB24
25.0000 mg | ORAL_TABLET | Freq: Every day | ORAL | 3 refills | Status: DC
Start: 2020-01-15 — End: 2021-01-14

## 2020-01-15 NOTE — Telephone Encounter (Signed)
The patient has been notified of the result and verbalized understanding.  All questions (if any) were answered. Antonieta Iba, RN 01/15/2020 3:25 PM  Referral have been placed for the lipid clinic and healthy weight and wellness program. Patient will start on Toprol 25 mg daily. She has been scheduled for a 2 month FU visit with Dr. Radford Pax.

## 2020-01-15 NOTE — Telephone Encounter (Signed)
Connected call to Carly in reference to results.

## 2020-01-15 NOTE — Telephone Encounter (Signed)
-----   Message from Sueanne Margarita, MD sent at 01/14/2020  2:19 PM EDT ----- Please let patient know that coronary flow was normal so no flow limiting lesions.  Will aggressively treat risk factors.  Refer to healthy weight and wellness clinic.  Referred to lipid clinic due to statin intolerant. Add Toprol XL 25mg  daily in addition to amlodipine and followup with me in 2 months

## 2020-01-16 ENCOUNTER — Telehealth: Payer: Self-pay

## 2020-01-16 NOTE — Telephone Encounter (Signed)
      I went in pt's chart to see who call ed her. I sent the message to [facility].

## 2020-01-23 ENCOUNTER — Ambulatory Visit (INDEPENDENT_AMBULATORY_CARE_PROVIDER_SITE_OTHER): Payer: Medicare Other | Admitting: Pharmacist

## 2020-01-23 ENCOUNTER — Ambulatory Visit: Payer: Medicare Other

## 2020-01-23 ENCOUNTER — Other Ambulatory Visit: Payer: Self-pay

## 2020-01-23 DIAGNOSIS — E785 Hyperlipidemia, unspecified: Secondary | ICD-10-CM

## 2020-01-23 MED ORDER — ROSUVASTATIN CALCIUM 5 MG PO TABS: 5.0000 mg | ORAL_TABLET | Freq: Every day | ORAL | 2 refills | Status: DC

## 2020-01-23 NOTE — Progress Notes (Signed)
Patient ID: [name]                 DOB: March 18, 1951                    MRN: [id number]     HPI: [name] is a 69 y.o. female patient referred to lipid clinic by Dr Radford Pax. PMH is significant for HTN, cerebral thrombus, T2DM, CKD and mild aortic insufficiency.  Has multiple intolerances to statins listed in chart.  Patient presents today reporting she was having a hypoglycemic episode and was drinking a soda.  Was feeling shaky and dizzy in waiting room.  Did not have meter with her.  Offered to have her blood glucose checked here in clinic but patient declined and reported she should start feeling better shortly.  Said her blood sugars have always been out of control.  Does not know why she was referred to lipid clinic and is also overdue for lipid panel.  Does not remember what her intolerances to statins were but is willing to try them again because she is concerned about her frequent chest pain.   Reports she can not eat certain foods due to "scar tissue" in her stomach.  Has another appointment with a GI surgeon and is contemplating weight loss surgery.  Is also interested in health and wellness clinic here at [facility].    Is concerned about costs of medications and treatment.  Does not have a Medicare part D plan.   Current Medications: N/A Previous medications: Welchol (d/c 12/11/15), pitavistatin 2 mg d/c 09/10/19,  Intolerances: Crestor (muscle pain, weakness), lipitor (muscle pain, weakness), Lovaza (stomach pain) Risk Factors: DM, HTN LDL goal: < 70  Family History: MI in mother and father, T2DM in mother and father, HTN in mother  Labs: Total cholesterol 172, LDL 105, HDL 44 all from 05/13/2019  Past Medical History:  Diagnosis Date  . Anemia    iron deficiency  . Arthritis   . Blood transfusion   . Cervical spondylosis with myelopathy and radiculopathy   . CVA (cerebral vascular accident) ([place])    11/2017  . Depression   . Diabetes mellitus    Type II  . Diastolic  dysfunction   . Diverticulitis   . DJD (degenerative joint disease)   . DVT (deep venous thrombosis) ([facility])    times 2 lower leg  . Endometriosis   . Family history of adverse reaction to anesthesia    mom and dad PONV  . Fibromyalgia   . GERD (gastroesophageal reflux disease)    occ  . Hiatal hernia   . History of kidney stones   . Hyperlipidemia   . Hypertension   . Hypothyroidism   . Insomnia   . Mild aortic insufficiency    by echo 04/2013  . Osteoarthritis    back and knee  . Ovarian cyst   . PONV (postoperative nausea and vomiting)   . RLS (restless legs syndrome)   . Thoracic compression fracture ([place])   . Uterine fibroid   . UTI (lower urinary tract infection)   . Villous adenoma of right colon 04/08/2016  . Vitamin D deficiency disease     Current Outpatient Medications on File Prior to Visit  Medication Sig Dispense Refill  . acetaminophen (TYLENOL) 500 MG tablet Take 1,000 mg by mouth every 8 (eight) hours as needed for mild pain or headache.    Marland Kitchen amLODipine (NORVASC) 2.5 MG tablet Take 5 mg by mouth  daily.     . aspirin EC 81 MG tablet Take 81 mg by mouth daily.    . Cholecalciferol (VITAMIN D3) 10 MCG (400 UNIT) CHEW Chew by mouth. OTC    . clobetasol (TEMOVATE) 0.05 % external solution Apply 1 application topically. 5 days per week as needed.  3  . Cyanocobalamin (B-12) 2500 MCG TABS Take 2,500 mcg by mouth daily.    . cyclobenzaprine (FLEXERIL) 10 MG tablet Take 1 tablet (10 mg total) by mouth 3 (three) times daily as needed for muscle spasms. 30 tablet 0  . dexlansoprazole (DEXILANT) 60 MG capsule Take 60 mg by mouth daily.    Marland Kitchen docusate sodium (COLACE) 100 MG capsule Take 1 capsule (100 mg total) by mouth 2 (two) times daily. 60 capsule 0  . DULoxetine (CYMBALTA) 60 MG capsule Take 60 mg by mouth daily.     Eduard Roux 140 MG/ML SOAJ Inject 140 mg into the skin every 30 (thirty) days. 3 pen 3  . ferrous sulfate 325 (65 FE) MG tablet Take 1 tablet (325 mg  total) by mouth 2 (two) times daily with a meal. (Patient taking differently: Take 325 mg by mouth daily with breakfast. ) 60 tablet 3  . fluticasone (FLONASE) 50 MCG/ACT nasal spray Place 2 sprays into both nostrils daily as needed for allergies or rhinitis.    . Fremanezumab-vfrm (AJOVY) 225 MG/1.5ML SOAJ Inject 225 mg into the skin every 30 (thirty) days. 1 pen 11  . furosemide (LASIX) 20 MG tablet Take 20 mg by mouth daily as needed for fluid or edema.     . gabapentin (NEURONTIN) 300 MG capsule Take 600 mg by mouth at bedtime.   1  . ibandronate (BONIVA) 150 MG tablet Take 150 mg by mouth every 30 (thirty) days.    Marland Kitchen ibuprofen (ADVIL) 200 MG tablet Take 200 mg by mouth 3 (three) times daily as needed for headache (pain).     . insulin glargine (LANTUS) 100 UNIT/ML injection Inject 40 Units into the skin daily. Takes in am    . insulin lispro (HUMALOG KWIKPEN) 100 UNIT/ML KwikPen Inject 12 Units into the skin 3 (three) times daily.     . irbesartan (AVAPRO) 300 MG tablet Take 300 mg by mouth daily.     Marland Kitchen levothyroxine (SYNTHROID, LEVOTHROID) 150 MCG tablet Take 150 mcg by mouth daily before breakfast.  5  . LORazepam (ATIVAN) 0.5 MG tablet TAKE 1 TABLET AS NEEDED FOR ANXIETY EVERY 8 HRS    . Magnesium 100 MG CAPS Take by mouth. OTC    . meclizine (ANTIVERT) 12.5 MG tablet Take 25 mg by mouth 3 (three) times daily as needed for dizziness.    . metoprolol succinate (TOPROL XL) 25 MG 24 hr tablet Take 1 tablet (25 mg total) by mouth daily. 90 tablet 3  . metoprolol tartrate (LOPRESSOR) 100 MG tablet Take 1 tablet (100 mg total) by mouth once for 1 dose. Take 1 tablet (100 mg total) two hours prior to CT scan. 1 tablet 0  . morphine (MSIR) 15 MG tablet Take 0.5 tablets (7.5 mg total) by mouth every 4 (four) hours as needed for severe pain. 3 tablet 0  . ondansetron (ZOFRAN ODT) 4 MG disintegrating tablet Take 1 tablet (4 mg total) by mouth every 8 (eight) hours as needed for nausea or vomiting. 20  tablet 0  . oxyCODONE-acetaminophen (PERCOCET/ROXICET) 5-325 MG tablet Take 1 tablet by mouth every 4 (four) hours as needed (pain). (Patient taking  differently: Take 1 tablet by mouth every 6 (six) hours as needed (pain). ) 30 tablet 0  . pantoprazole (PROTONIX) 40 MG tablet Take 40 mg by mouth daily.    . Probiotic Product (PROBIOTIC PO) Take 1 capsule by mouth daily.    . promethazine (PHENERGAN) 25 MG tablet Take 25 mg by mouth every 8 (eight) hours as needed for nausea or vomiting.    . sucralfate (CARAFATE) 1 GM/10ML suspension Take 1 g by mouth 4 (four) times daily.    . temazepam (RESTORIL) 15 MG capsule Take 15 mg by mouth at bedtime.   4   No current facility-administered medications on file prior to visit.    Allergies  Allergen Reactions  . Codeine Nausea And Vomiting  . Ambien [Zolpidem Tartrate] Other (See Comments)    Up sleep walking and eating  . Crestor [Rosuvastatin] Other (See Comments)    Muscle weakness, cramps and aching all over body  . Lipitor [Atorvastatin] Itching and Other (See Comments)    Muscle weakness, cramps and aches all over body  . Morphine And Related Other (See Comments)    Flushing and feeling hot Tolerates with Diphenhydramine  . Penicillins Rash and Other (See Comments)    Caused Headaches also Has patient had a PCN reaction causing immediate rash, facial/tongue/throat swelling, SOB or lightheadedness with hypotension: No Has patient had a PCN reaction causing severe rash involving mucus membranes or skin necrosis: No Has patient had a PCN reaction that required hospitalization No Has patient had a PCN reaction occurring within the last 10 years: No If all of the above answers are "NO", then may proceed with Cephalosporin use.    . Statins Other (See Comments)    Muscle weakness, cramps and aching all over body  . Suvorexant     ineffective Other reaction(s): Other ineffective  . Dilaudid [Hydromorphone Hcl] Other (See Comments)     Hallucinations/ argumentative, goes beserk  . Heparin Other (See Comments)    HIT ab positive  (SRA negative 11/25/17)   . Amitriptyline Other (See Comments)    Loopy feeling  . Ceftin [Cefuroxime Axetil] Other (See Comments)    Stomach pain   . Lovaza [Omega-3-Acid Ethyl Esters] Other (See Comments)    Stomach pain   . Lunesta [Eszopiclone] Other (See Comments)    Causes dizziness  . Lyrica [Pregabalin] Nausea Only  . Metformin And Related Nausea Only    Assessment/Plan:  1. Hyperlipidemia - Patient last LDL 105  which is above goal of <70, however, lipid panel needs updating.  Explained to patient she was referred to Lipid clinic so Dr Radford Pax can treat risk factors that may be causing chest pain.  Patient was more at ease after that.  Explained relationship between cholesterol and her elevated blood sugar and the role of healthy diet and exercise.  Encouraged patient to follow up with healthy weight and wellness clinic and offered to message nurse since she has not heard anything yet.  Patient is willing to trial statin again.  Will initiate rosuvastatin 5 mg daily.  Offered to patient she could try taking it 2-3 times a week but she preferred daily so she could get the most benefit.  Next step would be PCSK9i but patient may not be able to afford copay without assistance.  Advised patient to call lipid clinic with any side effects or concerns.  Order for lipid panel placed.    Karren Cobble, PharmD, [facility] [place], [place] [phone]  [address] [place], [place] [address] Phone: [phone]; Fax: [phone] 01/23/2020 10:17 AM

## 2020-01-23 NOTE — Patient Instructions (Signed)
It was great meeting you today!  We would like your LDL cholesterol to be less than 70.  Please come have your bloodwork drawn on Tuesday 6/22  Start the rosuvastatin (crestor) 5 mg once daily.    Continue healthy eating and exercise  Call with any issues  Karren Cobble, PharmD, BCACP, [place] [address] [place], [place] [address] Phone: [phone]; Fax: [phone] 01/23/2020 9:47 AM

## 2020-01-28 ENCOUNTER — Other Ambulatory Visit: Payer: Medicare Other

## 2020-02-11 DIAGNOSIS — L0101 Non-bullous impetigo: Secondary | ICD-10-CM | POA: Diagnosis not present

## 2020-02-18 ENCOUNTER — Other Ambulatory Visit: Payer: Self-pay

## 2020-02-18 ENCOUNTER — Other Ambulatory Visit: Payer: Medicare Other

## 2020-02-18 DIAGNOSIS — I10 Essential (primary) hypertension: Secondary | ICD-10-CM

## 2020-02-18 DIAGNOSIS — R079 Chest pain, unspecified: Secondary | ICD-10-CM

## 2020-02-18 LAB — BASIC METABOLIC PANEL
BUN/Creatinine Ratio: 15 (ref 12–28)
BUN: 14 mg/dL (ref 8–27)
CO2: 25 mmol/L (ref 20–29)
Calcium: 9.4 mg/dL (ref 8.7–10.3)
Chloride: 96 mmol/L (ref 96–106)
Creatinine, Ser: 0.93 mg/dL (ref 0.57–1.00)
GFR calc Af Amer: 73 mL/min/{1.73_m2} (ref >59)
GFR calc non Af Amer: 63 mL/min/{1.73_m2} (ref >59)
Glucose: 434 mg/dL — ABNORMAL HIGH (ref 65–99)
Potassium: 4.7 mmol/L (ref 3.5–5.2)
Sodium: 133 mmol/L — ABNORMAL LOW (ref 134–144)

## 2020-02-25 DIAGNOSIS — L218 Other seborrheic dermatitis: Secondary | ICD-10-CM | POA: Diagnosis not present

## 2020-02-26 ENCOUNTER — Other Ambulatory Visit: Payer: Self-pay

## 2020-02-26 ENCOUNTER — Ambulatory Visit
Admission: RE | Admit: 2020-02-26 | Discharge: 2020-02-26 | Disposition: A | Discharge status: Home / Self Care | Payer: Medicare Other | Source: Ambulatory Visit | Attending: Family Medicine | Admitting: Family Medicine

## 2020-02-26 DIAGNOSIS — Z1231 Encounter for screening mammogram for malignant neoplasm of breast: Secondary | ICD-10-CM

## 2020-03-06 DIAGNOSIS — Z8673 Personal history of transient ischemic attack (TIA), and cerebral infarction without residual deficits: Secondary | ICD-10-CM | POA: Diagnosis not present

## 2020-03-06 DIAGNOSIS — R197 Diarrhea, unspecified: Secondary | ICD-10-CM | POA: Diagnosis not present

## 2020-03-06 DIAGNOSIS — Z743 Need for continuous supervision: Secondary | ICD-10-CM | POA: Diagnosis not present

## 2020-03-06 DIAGNOSIS — R55 Syncope and collapse: Secondary | ICD-10-CM | POA: Diagnosis not present

## 2020-03-06 DIAGNOSIS — I110 Hypertensive heart disease with heart failure: Secondary | ICD-10-CM | POA: Diagnosis not present

## 2020-03-06 DIAGNOSIS — E860 Dehydration: Secondary | ICD-10-CM | POA: Diagnosis not present

## 2020-03-06 DIAGNOSIS — E11649 Type 2 diabetes mellitus with hypoglycemia without coma: Secondary | ICD-10-CM | POA: Diagnosis not present

## 2020-03-06 DIAGNOSIS — R1110 Vomiting, unspecified: Secondary | ICD-10-CM | POA: Diagnosis not present

## 2020-03-06 DIAGNOSIS — B9689 Other specified bacterial agents as the cause of diseases classified elsewhere: Secondary | ICD-10-CM | POA: Diagnosis not present

## 2020-03-06 DIAGNOSIS — E162 Hypoglycemia, unspecified: Secondary | ICD-10-CM | POA: Diagnosis not present

## 2020-03-06 DIAGNOSIS — N3000 Acute cystitis without hematuria: Secondary | ICD-10-CM | POA: Diagnosis not present

## 2020-03-06 DIAGNOSIS — E161 Other hypoglycemia: Secondary | ICD-10-CM | POA: Diagnosis not present

## 2020-03-06 DIAGNOSIS — Z794 Long term (current) use of insulin: Secondary | ICD-10-CM | POA: Diagnosis not present

## 2020-03-06 DIAGNOSIS — R4182 Altered mental status, unspecified: Secondary | ICD-10-CM | POA: Diagnosis not present

## 2020-03-06 DIAGNOSIS — I509 Heart failure, unspecified: Secondary | ICD-10-CM | POA: Diagnosis not present

## 2020-03-06 DIAGNOSIS — E871 Hypo-osmolality and hyponatremia: Secondary | ICD-10-CM | POA: Diagnosis not present

## 2020-03-19 ENCOUNTER — Other Ambulatory Visit: Payer: Self-pay

## 2020-03-19 ENCOUNTER — Encounter: Payer: Self-pay | Admitting: Cardiology

## 2020-03-19 ENCOUNTER — Ambulatory Visit (INDEPENDENT_AMBULATORY_CARE_PROVIDER_SITE_OTHER): Payer: Medicare Other | Admitting: Cardiology

## 2020-03-19 VITALS — BP 118/64 | HR 85 | Ht 63.0 in | Wt 210.8 lb

## 2020-03-19 DIAGNOSIS — E7800 Pure hypercholesterolemia, unspecified: Secondary | ICD-10-CM

## 2020-03-19 DIAGNOSIS — N1831 Chronic kidney disease, stage 3a: Secondary | ICD-10-CM

## 2020-03-19 DIAGNOSIS — I2510 Atherosclerotic heart disease of native coronary artery without angina pectoris: Secondary | ICD-10-CM | POA: Diagnosis not present

## 2020-03-19 DIAGNOSIS — I491 Atrial premature depolarization: Secondary | ICD-10-CM | POA: Diagnosis not present

## 2020-03-19 DIAGNOSIS — I10 Essential (primary) hypertension: Secondary | ICD-10-CM

## 2020-03-19 NOTE — Progress Notes (Signed)
Cardiology Office Note:    Date:  03/19/2020   ID:  [name], DOB Aug 15, 1950, MRN [id number]  PCP:  Harlan Stains, MD  Cardiologist:  Fransico Him, MD    Referring MD: Harlan Stains, MD   Chief Complaint  Patient presents with  . Coronary Artery Disease  . Hypertension  . Hyperlipidemia    History of Present Illness:    [name] is a 69 y.o. female with a hx of  DM2, hypothyroidism, HTN, depression, GERD, CKD stage 3 and fibromyalgia who recently saw her PCP due to some problems with feeling lightheaded and weak.  One day she fel lightheaded after getting out of the shower and had to lay down for 20 minutes and then went back to the bathroom and saw colors in her vision and was off balance.  Her BP had been running soft so her PCP cut her Olmesartan back.  She was noted to have frequent PACs on that EKG and was referred to Cardiology for further evaluation.    When I saw her she had just had a  URI and had had some problems with dizziness and lightheadedness.  She has  episodes where all of a sudden she would get a wave where she got a rush down her body and then felt very dizzy and had to lay down.  BP at that time was 163/16mmHg.  She has not had any times when her BP was low.  She has had about 4 episodes.  She has been under a lot of stress as she has been the primary caregiver for her mom who died a few weeks ago and has had do deal with a lot of issues.    A 2D echo showed normal LVF in 09/2017.  She was then admitted with a CVA and TEE showed normal LVF with mild MR and AR .  Event monitor was done showing PACs.   She was seen in the ER on 12/02/2019 with chest pain while driving her car.  The pain was midsternal and radiated to the right sided and radiated under her right breast and into her back on the right and into her jaw.  There was no associated SOB or diaphoresis but has some nausea.  It was worse with deep breathing and the next day her sternum was sore the entire  day.  She was seen back by GI and started on Carafate and instructed to followup with Cardiology.   She has never smoked and no fm hx of MI.  She underwent coronary CTA showing coronary CTA showed a coronary Ca score of 506, mild CAD with 25-49% mRCA and moderate CAD with 50-69% sequential mRCA, 25-49% inferior sub branch of D1 and 25-49% in pLCx.  CT FFR flow analysis demonstrates no hemodynamically significant flow limiting lesions.   She has been on medical management.   She is here today for followup and is doing well.  She denies any chest pain or pressure, SOB, DOE, PND, orthopnea, LE edema, dizziness, palpitations or syncope. SHe is compliant with her meds and is tolerating meds with no SE.     Past Medical History:  Diagnosis Date  . Anemia    iron deficiency  . Arthritis   . Blood transfusion   . Cervical spondylosis with myelopathy and radiculopathy   . CVA (cerebral vascular accident) ([place])    11/2017  . Depression   . Diabetes mellitus    Type II  . Diastolic dysfunction   .  Diverticulitis   . DJD (degenerative joint disease)   . DVT (deep venous thrombosis) ([facility])    times 2 lower leg  . Endometriosis   . Family history of adverse reaction to anesthesia    mom and dad PONV  . Fibromyalgia   . GERD (gastroesophageal reflux disease)    occ  . Hiatal hernia   . History of kidney stones   . Hyperlipidemia   . Hypertension   . Hypothyroidism   . Insomnia   . Mild aortic insufficiency    by echo 04/2013  . Osteoarthritis    back and knee  . Ovarian cyst   . PONV (postoperative nausea and vomiting)   . RLS (restless legs syndrome)   . Thoracic compression fracture ([place])   . Uterine fibroid   . UTI (lower urinary tract infection)   . Villous adenoma of right colon 04/08/2016  . Vitamin D deficiency disease     Past Surgical History:  Procedure Laterality Date  . ANTERIOR CERVICAL DECOMP/DISCECTOMY FUSION N/A 03/23/2017   Procedure: ANTERIOR CERVICAL  DECOMPRESSION/DISCECTOMY FUSION, INTERBODY PROSTHESIS,PLATE CERVICAL THREE- CERVICAL FOUR, CERVICAL FOUR- CERVICAL FIVE, CERVICAL FIVE- CERVICAL SIX;  Surgeon: Newman Pies, MD;  Location: [facility];  Service: Neurosurgery;  Laterality: N/A;  . ANTERIOR CERVICAL DECOMP/DISCECTOMY FUSION N/A 04/18/2019   Procedure: ANTERIOR CERVICAL DECOMPRESSION/DISCECTOMY  CERVICAL SIX- CERVICAL SEVEN;  Surgeon: Newman Pies, MD;  Location: [facility] [place];  Service: Neurosurgery;  Laterality: N/A;  anterior  . APPENDECTOMY    . BACK SURGERY  ,2005   April  2013 - spinal fusion[facility]  . bowel blockage surgery    . BREAST SURGERY     breast reduction  . CARDIAC CATHETERIZATION  04/2013   normal coronary arteries and normal LVF  . CARPAL TUNNEL RELEASE  06/05/2012   Procedure: CARPAL TUNNEL RELEASE;  Surgeon: Cammie Sickle., MD;  Location: [facility];  Service: Orthopedics;  Laterality: Left;  . CESAREAN SECTION     x 2  . CHOLECYSTECTOMY    . COLECTOMY  04/08/2016  . DILATION AND CURETTAGE OF UTERUS    . DORSAL COMPARTMENT RELEASE  06/05/2012   Procedure: RELEASE DORSAL COMPARTMENT (DEQUERVAIN);  Surgeon: Cammie Sickle., MD;  Location: [facility];  Service: Orthopedics;  Laterality: Left;  Excision of mixoid cyst also  . EYE SURGERY     cataracts bilateral  . JOINT REPLACEMENT     Bilateral knee  . KNEE ARTHROPLASTY  09   lft partial  . KNEE ARTHROPLASTY     rt  . LAPAROSCOPIC PARTIAL COLECTOMY N/A 04/08/2016   Procedure: LAPAROSCOPIC ASSISTED ASCENDING COLECTOMY POSSIBLE OPEN COLECTOMY;  Surgeon: Fanny Skates, MD;  Location: [place];  Service: General;  Laterality: N/A;  . orthopedic surgeries     multiple  . REDUCTION MAMMAPLASTY Bilateral   . SHOULDER OPEN ROTATOR CUFF REPAIR     rt and lft  . TEE WITHOUT CARDIOVERSION N/A 11/23/2017   Procedure: TRANSESOPHAGEAL ECHOCARDIOGRAM (TEE);  Surgeon: Jerline Pain, MD;  Location: [facility];  Service: Cardiovascular;   Laterality: N/A;  . TUBAL LIGATION     btsp    Current Medications: Current Meds  Medication Sig  . acetaminophen (TYLENOL) 500 MG tablet Take 1,000 mg by mouth every 8 (eight) hours as needed for mild pain or headache.  Marland Kitchen amLODipine (NORVASC) 2.5 MG tablet Take 5 mg by mouth daily.   Marland Kitchen aspirin EC 81 MG tablet Take 81 mg by mouth  daily.  . Cholecalciferol (VITAMIN D3) 10 MCG (400 UNIT) CHEW Chew by mouth. OTC  . clobetasol (TEMOVATE) 0.05 % external solution Apply 1 application topically. 5 days per week as needed.  . Cyanocobalamin (B-12) 2500 MCG TABS Take 2,500 mcg by mouth daily.  . cyclobenzaprine (FLEXERIL) 10 MG tablet Take 1 tablet (10 mg total) by mouth 3 (three) times daily as needed for muscle spasms.  Marland Kitchen dexlansoprazole (DEXILANT) 60 MG capsule Take 60 mg by mouth daily.  Marland Kitchen docusate sodium (COLACE) 100 MG capsule Take 1 capsule (100 mg total) by mouth 2 (two) times daily.  . DULoxetine (CYMBALTA) 60 MG capsule Take 60 mg by mouth daily.   Eduard Roux 140 MG/ML SOAJ Inject 140 mg into the skin every 30 (thirty) days.  . ferrous sulfate 325 (65 FE) MG tablet Take 1 tablet (325 mg total) by mouth 2 (two) times daily with a meal. (Patient taking differently: Take 325 mg by mouth daily with breakfast. )  . fluticasone (FLONASE) 50 MCG/ACT nasal spray Place 2 sprays into both nostrils daily as needed for allergies or rhinitis.  . Fremanezumab-vfrm (AJOVY) 225 MG/1.5ML SOAJ Inject 225 mg into the skin every 30 (thirty) days.  . furosemide (LASIX) 20 MG tablet Take 20 mg by mouth daily as needed for fluid or edema.   . gabapentin (NEURONTIN) 300 MG capsule Take 600 mg by mouth at bedtime.   . ibandronate (BONIVA) 150 MG tablet Take 150 mg by mouth every 30 (thirty) days.  Marland Kitchen ibuprofen (ADVIL) 200 MG tablet Take 200 mg by mouth 3 (three) times daily as needed for headache (pain).   . insulin glargine (LANTUS) 100 UNIT/ML injection Inject 32 Units into the skin daily. Takes in am  .  insulin lispro (HUMALOG KWIKPEN) 100 UNIT/ML KwikPen Inject 18 Units into the skin 3 (three) times daily.   . irbesartan (AVAPRO) 300 MG tablet Take 300 mg by mouth daily.   Marland Kitchen levothyroxine (SYNTHROID, LEVOTHROID) 150 MCG tablet Take 150 mcg by mouth daily before breakfast.  . LORazepam (ATIVAN) 0.5 MG tablet TAKE 1 TABLET AS NEEDED FOR ANXIETY EVERY 8 HRS  . Magnesium 100 MG CAPS Take by mouth. OTC  . meclizine (ANTIVERT) 12.5 MG tablet Take 25 mg by mouth 3 (three) times daily as needed for dizziness.  . metoprolol succinate (TOPROL XL) 25 MG 24 hr tablet Take 1 tablet (25 mg total) by mouth daily.  Marland Kitchen morphine (MSIR) 15 MG tablet Take 0.5 tablets (7.5 mg total) by mouth every 4 (four) hours as needed for severe pain.  Marland Kitchen ondansetron (ZOFRAN ODT) 4 MG disintegrating tablet Take 1 tablet (4 mg total) by mouth every 8 (eight) hours as needed for nausea or vomiting.  Marland Kitchen oxyCODONE-acetaminophen (PERCOCET/ROXICET) 5-325 MG tablet Take 1 tablet by mouth every 4 (four) hours as needed (pain). (Patient taking differently: Take 1 tablet by mouth every 6 (six) hours as needed (pain). )  . pantoprazole (PROTONIX) 40 MG tablet Take 40 mg by mouth daily.  . Probiotic Product (PROBIOTIC PO) Take 1 capsule by mouth daily.  . promethazine (PHENERGAN) 25 MG tablet Take 25 mg by mouth every 8 (eight) hours as needed for nausea or vomiting.  . rosuvastatin (CRESTOR) 5 MG tablet Take 1 tablet (5 mg total) by mouth daily.  . sucralfate (CARAFATE) 1 g tablet Take 1 g by mouth 4 (four) times daily.  . sucralfate (CARAFATE) 1 GM/10ML suspension Take 1 g by mouth 4 (four) times daily.   Marland Kitchen  temazepam (RESTORIL) 15 MG capsule Take 15 mg by mouth at bedtime.      Allergies:   Codeine, Ambien [zolpidem tartrate], Crestor [rosuvastatin], Lipitor [atorvastatin], Morphine and related, Penicillins, Statins, Suvorexant, Dilaudid [hydromorphone hcl], Heparin, Amitriptyline, Ceftin [cefuroxime axetil], Lovaza [omega-3-acid ethyl  esters], Lunesta [eszopiclone], Lyrica [pregabalin], and Metformin and related   Social History   Socioeconomic History  . Marital status: Married    Spouse name: Not on file  . Number of children: 2  . Years of education: trade school  . Highest education level: Not on file  Occupational History  . Not on file  Tobacco Use  . Smoking status: Never Smoker  . Smokeless tobacco: Never Used  Vaping Use  . Vaping Use: Never used  Substance and Sexual Activity  . Alcohol use: Never  . Drug use: Never  . Sexual activity: Yes    Birth control/protection: Post-menopausal, Surgical  Other Topics Concern  . Not on file  Social History Narrative   Lives at home with her husband   Right handed   Caffeine: 2-3 cups of coffee/day   Social Determinants of Health   Financial Resource Strain:   . Difficulty of Paying Living Expenses:   Food Insecurity:   . Worried About Charity fundraiser in the Last Year:   . Arboriculturist in the Last Year:   Transportation Needs:   . Film/video editor (Medical):   Marland Kitchen Lack of Transportation (Non-Medical):   Physical Activity:   . Days of Exercise per Week:   . Minutes of Exercise per Session:   Stress:   . Feeling of Stress :   Social Connections:   . Frequency of Communication with Friends and Family:   . Frequency of Social Gatherings with Friends and Family:   . Attends Religious Services:   . Active Member of Clubs or Organizations:   . Attends Archivist Meetings:   Marland Kitchen Marital Status:      Family History: The patient's family history includes Breast cancer in her cousin, cousin, maternal aunt, and maternal aunt; Breast cancer (age of onset: 56) in her mother; Dementia in her mother; Diabetes in her brother; Diabetes type II in her father and mother; Heart attack in her brother and father; High blood pressure in an other family member; Hypertension in her mother; Hypothyroidism in her brother and father; Kidney failure in her  father; Lung cancer in her brother.  ROS:   Please see the history of present illness.    ROS  All other systems reviewed and negative.   EKGs/Labs/Other Studies Reviewed:    The following studies were reviewed today:  Coronary CTA 01/2020  Aorta:  Normal size.  Scattered calcifications.  No dissection.  Aortic Valve:  Trileaflet.  No calcifications.  Mitral Valve: Marked posterior mitral annular calcifications.  Coronary Arteries:  Normal coronary origin.  Right dominance.  RCA is a large dominant artery that gives rise to PDA and PLVB. There is mild calcified plaque in the mid RCA with associated stenosis of 25-49% and sequential mid RCA lesion with moderate mixed plaque with positive remodeling with associated stenosis of 50-69%.  Left main is a large artery that gives rise to LAD and LCX arteries. There is no plaque.  LAD is a large vessel that gives rise to a large branching D1. There is minimal calcified plaque in the proximal LAD with associated stenosis of 0-24%. There is mild calcified plaque in the inferior sub branch of  the Diagonal with associated stenosis of 25-49%.  LCX is a non-dominant artery that gives rise to one large OM1 branch. There is mild mixed plaque with positive remodeling in the proximal LCx with associated stenosis of 25-49%.  Other findings:  Normal pulmonary vein drainage into the left atrium.  Normal let atrial appendage without a thrombus.  Normal size of the pulmonary artery.  IMPRESSION: 1. Coronary calcium score of 506. This was 94th percentile for age and sex matched control.  2.  Normal coronary origin with right dominance.  3.  Moderate atherosclerosis.  CAD-RADS=3.  4. Consider symptom-guided anti-ischemic and preventive pharmacotherapy as well as risk factor modification per guideline-directed care.  5.  Consider functional ischemia assessment.  6.  This study has been submitted for FFR  analysis.   EKG:  EKG is not ordered today.    Recent Labs: 07/10/2019: TSH 1.310 12/02/2019: ALT 15; Hemoglobin 14.0; Platelets 302 02/18/2020: BUN 14; Creatinine, Ser 0.93; Potassium 4.7; Sodium 133   Recent Lipid Panel    Component Value Date/Time   CHOL 92 11/22/2017 0800   TRIG 159 (H) 11/22/2017 0800   HDL 25 (L) 11/22/2017 0800   CHOLHDL 3.7 11/22/2017 0800   VLDL 32 11/22/2017 0800   LDLCALC 35 11/22/2017 0800    Physical Exam:    VS:  BP 118/64   Pulse 85   Ht 5\' 3"  (1.6 m)   Wt 210 lb 12.8 oz (95.6 kg)   SpO2 94%   BMI 37.34 kg/m     Wt Readings from Last 3 Encounters:  03/19/20 210 lb 12.8 oz (95.6 kg)  12/17/19 208 lb 1.9 oz (94.4 kg)  09/10/19 211 lb 12.8 oz (96.1 kg)     GEN: Well nourished, well developed in no acute distress HEENT: Normal NECK: No JVD; No carotid bruits LYMPHATICS: No lymphadenopathy CARDIAC:RRR, no murmurs, rubs, gallops RESPIRATORY:  Clear to auscultation without rales, wheezing or rhonchi  ABDOMEN: Soft, non-tender, non-distended MUSCULOSKELETAL:  No edema; No deformity  SKIN: Warm and dry NEUROLOGIC:  Alert and oriented x 3 PSYCHIATRIC:  Normal affect    ASSESSMENT:    1. Coronary artery disease involving native coronary artery of native heart without angina pectoris   2. Essential hypertension   3. Pure hypercholesterolemia   4. PAC (premature atrial contraction)   5. Stage 3a chronic kidney disease    PLAN:    In order of problems listed above:  1.  ASCAD -atypical chest pain -coronary CTA showed a coronary Ca score of 506, mild CAD with 25-49% mRCA and moderate CAD with 50-69% sequential mRCA, 25-49% inferior sub branch of D1 and 25-49% in pLCx.   -FFR flow analysis showed no flow limiting lesions -she has not had any further chest pain -continue ASA 81mg  daily and statin.    2.  HTN -BP is controlled -continue amlodipine 5mg  daily, Toprol XL 25mg  daily and Irbesartan 300mg  daily  3.  DM with  NAT5T -followed by PCP -SCr was 0.93 in July  4.  HLD -LDL goal < 70 -LDL was 105 last fall -repeat FLP and ALT -continue Crestor 5mg  daily  5.  PACs -controlled on Toprol   Medication Adjustments/Labs and Tests Ordered: Current medicines are reviewed at length with the patient today.  Concerns regarding medicines are outlined above.  No orders of the defined types were placed in this encounter.  No orders of the defined types were placed in this encounter.   Signed, Fransico Him, MD  03/19/2020  1:45 PM    [facility]

## 2020-03-19 NOTE — Patient Instructions (Signed)

## 2020-03-24 ENCOUNTER — Other Ambulatory Visit: Payer: Medicare Other

## 2020-03-25 ENCOUNTER — Other Ambulatory Visit: Payer: Self-pay

## 2020-03-25 ENCOUNTER — Other Ambulatory Visit: Payer: Medicare Other | Admitting: *Deleted

## 2020-03-25 DIAGNOSIS — E039 Hypothyroidism, unspecified: Secondary | ICD-10-CM | POA: Diagnosis not present

## 2020-03-25 DIAGNOSIS — E785 Hyperlipidemia, unspecified: Secondary | ICD-10-CM | POA: Diagnosis not present

## 2020-03-25 LAB — LIPID PANEL
Chol/HDL Ratio: 3.4 ratio (ref 0.0–4.4)
Cholesterol, Total: 162 mg/dL (ref 100–199)
HDL: 48 mg/dL (ref >39)
LDL Chol Calc (NIH): 94 mg/dL (ref 0–99)
Triglycerides: 109 mg/dL (ref 0–149)
VLDL Cholesterol Cal: 20 mg/dL (ref 5–40)

## 2020-03-26 ENCOUNTER — Telehealth: Payer: Self-pay | Admitting: Cardiology

## 2020-03-26 NOTE — Telephone Encounter (Signed)
The patient has been notified of the result and verbalized understanding.  All questions (if any) were answered. Antonieta Iba, RN 03/26/2020 11:11 AM

## 2020-03-26 NOTE — Telephone Encounter (Signed)
Follow up:    Patient returning call back concerning some results. Please call back.

## 2020-04-02 DIAGNOSIS — K56699 Other intestinal obstruction unspecified as to partial versus complete obstruction: Secondary | ICD-10-CM | POA: Diagnosis not present

## 2020-04-02 DIAGNOSIS — R109 Unspecified abdominal pain: Secondary | ICD-10-CM | POA: Diagnosis not present

## 2020-04-14 ENCOUNTER — Ambulatory Visit (INDEPENDENT_AMBULATORY_CARE_PROVIDER_SITE_OTHER): Payer: Medicare Other | Admitting: Family Medicine

## 2020-04-14 ENCOUNTER — Encounter: Payer: Self-pay | Admitting: Family Medicine

## 2020-04-14 VITALS — BP 128/84 | HR 86 | Ht 63.0 in | Wt 213.2 lb

## 2020-04-14 DIAGNOSIS — G43709 Chronic migraine without aura, not intractable, without status migrainosus: Secondary | ICD-10-CM

## 2020-04-14 MED ORDER — EMGALITY 120 MG/ML ~~LOC~~ SOAJ
120.0000 mg | SUBCUTANEOUS | 0 refills | Status: DC
Start: 2020-04-14 — End: 2021-01-14

## 2020-04-14 NOTE — Patient Instructions (Addendum)
We will try to get Emgality covered. Please call me if you have any difficulty getting this medication. You will take 2 injections once you get medication. Then we will repeat 1 injection every 30 days. Call me to let me know you got the medication and I will re prescribe maintenance dose.    We will consider sleep apnea testing if you would like. We can also consider Botox therapy for migraine management.    Stay well hydrated. Make sure to get 60 ounces a day. Eat well balanced meals. Work closely with PCP and endocrinology for co morbidity management.    Follow up with me in 3 months     Galcanezumab injection What is this medicine? GALCANEZUMAB (gal ka NEZ ue mab) is used to prevent migraines and treat cluster headaches. This medicine may be used for other purposes; ask your health care provider or pharmacist if you have questions. COMMON BRAND NAME(S): Emgality What should I tell my health care provider before I take this medicine? They need to know if you have any of these conditions:  an unusual or allergic reaction to galcanezumab, other medicines, foods, dyes, or preservatives  pregnant or trying to get pregnant  breast-feeding How should I use this medicine? This medicine is for injection under the skin. You will be taught how to prepare and give this medicine. Use exactly as directed. Take your medicine at regular intervals. Do not take your medicine more often than directed. It is important that you put your used needles and syringes in a special sharps container. Do not put them in a trash can. If you do not have a sharps container, call your pharmacist or healthcare provider to get one. Talk to your pediatrician regarding the use of this medicine in children. Special care may be needed. Overdosage: If you think you have taken too much of this medicine contact a poison control center or emergency room at once. NOTE: This medicine is only for you. Do not share this  medicine with others. What if I miss a dose? If you miss a dose, take it as soon as you can. If it is almost time for your next dose, take only that dose. Do not take double or extra doses. What may interact with this medicine? Interactions are not expected. This list may not describe all possible interactions. Give your health care provider a list of all the medicines, herbs, non-prescription drugs, or dietary supplements you use. Also tell them if you smoke, drink alcohol, or use illegal drugs. Some items may interact with your medicine. What should I watch for while using this medicine? Tell your doctor or healthcare professional if your symptoms do not start to get better or if they get worse. What side effects may I notice from receiving this medicine? Side effects that you should report to your doctor or health care professional as soon as possible:  allergic reactions like skin rash, itching or hives, swelling of the face, lips, or tongue Side effects that usually do not require medical attention (report these to your doctor or health care professional if they continue or are bothersome):  pain, redness, or irritation at site where injected This list may not describe all possible side effects. Call your doctor for medical advice about side effects. You may report side effects to FDA at 1-800-FDA-1088. Where should I keep my medicine? Keep out of the reach of children. You will be instructed on how to store this medicine. Throw away any unused  medicine after the expiration date on the label. NOTE: This sheet is a summary. It may not cover all possible information. If you have questions about this medicine, talk to your doctor, pharmacist, or health care provider.  2020 Elsevier/Gold Standard (2018-01-10 12:03:23)   Migraine Headache A migraine headache is a very strong throbbing pain on one side or both sides of your head. This type of headache can also cause other symptoms. It can  last from 4 hours to 3 days. Talk with your doctor about what things may bring on (trigger) this condition. What are the causes? The exact cause of this condition is not known. This condition may be triggered or caused by:  Drinking alcohol.  Smoking.  Taking medicines, such as: ? Medicine used to treat chest pain (nitroglycerin). ? Birth control pills. ? Estrogen. ? Some blood pressure medicines.  Eating or drinking certain products.  Doing physical activity. Other things that may trigger a migraine headache include:  Having a menstrual period.  Pregnancy.  Hunger.  Stress.  Not getting enough sleep or getting too much sleep.  Weather changes.  Tiredness (fatigue). What increases the risk?  Being 61-43 years old.  Being female.  Having a family history of migraine headaches.  Being Caucasian.  Having depression or anxiety.  Being very overweight. What are the signs or symptoms?  A throbbing pain. This pain may: ? Happen in any area of the head, such as on one side or both sides. ? Make it hard to do daily activities. ? Get worse with physical activity. ? Get worse around bright lights or loud noises.  Other symptoms may include: ? Feeling sick to your stomach (nauseous). ? Vomiting. ? Dizziness. ? Being sensitive to bright lights, loud noises, or smells.  Before you get a migraine headache, you may get warning signs (an aura). An aura may include: ? Seeing flashing lights or having blind spots. ? Seeing bright spots, halos, or zigzag lines. ? Having tunnel vision or blurred vision. ? Having numbness or a tingling feeling. ? Having trouble talking. ? Having weak muscles.  Some people have symptoms after a migraine headache (postdromal phase), such as: ? Tiredness. ? Trouble thinking (concentrating). How is this treated?  Taking medicines that: ? Relieve pain. ? Relieve the feeling of being sick to your stomach. ? Prevent migraine  headaches.  Treatment may also include: ? Having acupuncture. ? Avoiding foods that bring on migraine headaches. ? Learning ways to control your body functions (biofeedback). ? Therapy to help you know and deal with negative thoughts (cognitive behavioral therapy). Follow these instructions at home: Medicines  Take over-the-counter and prescription medicines only as told by your doctor.  Ask your doctor if the medicine prescribed to you: ? Requires you to avoid driving or using heavy machinery. ? Can cause trouble pooping (constipation). You may need to take these steps to prevent or treat trouble pooping:  Drink enough fluid to keep your pee (urine) pale yellow.  Take over-the-counter or prescription medicines.  Eat foods that are high in fiber. These include beans, whole grains, and fresh fruits and vegetables.  Limit foods that are high in fat and sugar. These include fried or sweet foods. Lifestyle  Do not drink alcohol.  Do not use any products that contain nicotine or tobacco, such as cigarettes, e-cigarettes, and chewing tobacco. If you need help quitting, ask your doctor.  Get at least 8 hours of sleep every night.  Limit and deal with stress. General  instructions      Keep a journal to find out what may bring on your migraine headaches. For example, write down: ? What you eat and drink. ? How much sleep you get. ? Any change in what you eat or drink. ? Any change in your medicines.  If you have a migraine headache: ? Avoid things that make your symptoms worse, such as bright lights. ? It may help to lie down in a dark, quiet room. ? Do not drive or use heavy machinery. ? Ask your doctor what activities are safe for you.  Keep all follow-up visits as told by your doctor. This is important. Contact a doctor if:  You get a migraine headache that is different or worse than others you have had.  You have more than 15 headache days in one month. Get help  right away if:  Your migraine headache gets very bad.  Your migraine headache lasts longer than 72 hours.  You have a fever.  You have a stiff neck.  You have trouble seeing.  Your muscles feel weak or like you cannot control them.  You start to lose your balance a lot.  You start to have trouble walking.  You pass out (faint).  You have a seizure. Summary  A migraine headache is a very strong throbbing pain on one side or both sides of your head. These headaches can also cause other symptoms.  This condition may be treated with medicines and changes to your lifestyle.  Keep a journal to find out what may bring on your migraine headaches.  Contact a doctor if you get a migraine headache that is different or worse than others you have had.  Contact your doctor if you have more than 15 headache days in a month. This information is not intended to replace advice given to you by your health care provider. Make sure you discuss any questions you have with your health care provider. Document Revised: 11/16/2018 Document Reviewed: 09/06/2018 Elsevier Patient Education  [place].  OnabotulinumtoxinA injection (Medical Use) What is this medicine? ONABOTULINUMTOXINA (o na BOTT you lye num tox in eh) is a neuro-muscular blocker. This medicine is used to treat crossed eyes, eyelid spasms, severe neck muscle spasms, ankle and toe muscle spasms, and elbow, wrist, and finger muscle spasms. It is also used to treat excessive underarm sweating, to prevent chronic migraine headaches, and to treat loss of bladder control due to neurologic conditions such as multiple sclerosis or spinal cord injury. This medicine may be used for other purposes; ask your health care provider or pharmacist if you have questions. COMMON BRAND NAME(S): Botox What should I tell my health care provider before I take this medicine? They need to know if you have any of these conditions:  breathing  problems  cerebral palsy spasms  difficulty urinating  heart problems  history of surgery where this medicine is going to be used  infection at the site where this medicine is going to be used  myasthenia gravis or other neurologic disease  nerve or muscle disease  surgery plans  take medicines that treat or prevent blood clots  thyroid problems  an unusual or allergic reaction to botulinum toxin, albumin, other medicines, foods, dyes, or preservatives  pregnant or trying to get pregnant  breast-feeding How should I use this medicine? This medicine is for injection into a muscle. It is given by a health care professional in a hospital or clinic setting. Talk to your  pediatrician regarding the use of this medicine in children. While this drug may be prescribed for children as young as 81 years old for selected conditions, precautions do apply. Overdosage: If you think you have taken too much of this medicine contact a poison control center or emergency room at once. NOTE: This medicine is only for you. Do not share this medicine with others. What if I miss a dose? This does not apply. What may interact with this medicine?  aminoglycoside antibiotics like gentamicin, neomycin, tobramycin  muscle relaxants  other botulinum toxin injections This list may not describe all possible interactions. Give your health care provider a list of all the medicines, herbs, non-prescription drugs, or dietary supplements you use. Also tell them if you smoke, drink alcohol, or use illegal drugs. Some items may interact with your medicine. What should I watch for while using this medicine? Visit your doctor for regular check ups. This medicine will cause weakness in the muscle where it is injected. Tell your doctor if you feel unusually weak in other muscles. Get medical help right away if you have problems with breathing, swallowing, or talking. This medicine might make your eyelids droop or  make you see blurry or double. If you have weak muscles or trouble seeing do not drive a car, use machinery, or do other dangerous activities. This medicine contains albumin from human blood. It may be possible to pass an infection in this medicine, but no cases have been reported. Talk to your doctor about the risks and benefits of this medicine. If your activities have been limited by your condition, go back to your regular routine slowly after treatment with this medicine. What side effects may I notice from receiving this medicine? Side effects that you should report to your doctor or health care professional as soon as possible:  allergic reactions like skin rash, itching or hives, swelling of the face, lips, or tongue  breathing problems  changes in vision  chest pain or tightness  eye irritation, pain  fast, irregular heartbeat  infection  numbness  speech problems  swallowing problems  unusual weakness Side effects that usually do not require medical attention (report to your doctor or health care professional if they continue or are bothersome):  bruising or pain at site where injected  drooping eyelid  dry eyes or mouth  headache  muscles aches, pains  sensitivity to light  tearing This list may not describe all possible side effects. Call your doctor for medical advice about side effects. You may report side effects to FDA at 1-800-FDA-1088. Where should I keep my medicine? This drug is given in a hospital or clinic and will not be stored at home. NOTE: This sheet is a summary. It may not cover all possible information. If you have questions about this medicine, talk to your doctor, pharmacist, or health care provider.  2020 Elsevier/Gold Standard (2018-01-29 14:21:42)    Sleep Apnea Sleep apnea affects breathing during sleep. It causes breathing to stop for a short time or to become shallow. It can also increase the risk of:  Heart  attack.  Stroke.  Being very overweight (obese).  Diabetes.  Heart failure.  Irregular heartbeat. The goal of treatment is to help you breathe normally again. What are the causes? There are three kinds of sleep apnea:  Obstructive sleep apnea. This is caused by a blocked or collapsed airway.  Central sleep apnea. This happens when the brain does not send the right signals to the  muscles that control breathing.  Mixed sleep apnea. This is a combination of obstructive and central sleep apnea. The most common cause of this condition is a collapsed or blocked airway. This can happen if:  Your throat muscles are too relaxed.  Your tongue and tonsils are too large.  You are overweight.  Your airway is too small. What increases the risk?  Being overweight.  Smoking.  Having a small airway.  Being older.  Being female.  Drinking alcohol.  Taking medicines to calm yourself (sedatives or tranquilizers).  Having family members with the condition. What are the signs or symptoms?  Trouble staying asleep.  Being sleepy or tired during the day.  Getting angry a lot.  Loud snoring.  Headaches in the morning.  Not being able to focus your mind (concentrate).  Forgetting things.  Less interest in sex.  Mood swings.  Personality changes.  Feelings of sadness (depression).  Waking up a lot during the night to pee (urinate).  Dry mouth.  Sore throat. How is this diagnosed?  Your medical history.  A physical exam.  A test that is done when you are sleeping (sleep study). The test is most often done in a sleep lab but may also be done at home. How is this treated?   Sleeping on your side.  Using a medicine to get rid of mucus in your nose (decongestant).  Avoiding the use of alcohol, medicines to help you relax, or certain pain medicines (narcotics).  Losing weight, if needed.  Changing your diet.  Not smoking.  Using a machine to open your  airway while you sleep, such as: ? An oral appliance. This is a mouthpiece that shifts your lower jaw forward. ? A CPAP device. This device blows air through a mask when you breathe out (exhale). ? An EPAP device. This has valves that you put in each nostril. ? A BPAP device. This device blows air through a mask when you breathe in (inhale) and breathe out.  Having surgery if other treatments do not work. It is important to get treatment for sleep apnea. Without treatment, it can lead to:  High blood pressure.  Coronary artery disease.  In men, not being able to have an erection (impotence).  Reduced thinking ability. Follow these instructions at home: Lifestyle  Make changes that your doctor recommends.  Eat a healthy diet.  Lose weight if needed.  Avoid alcohol, medicines to help you relax, and some pain medicines.  Do not use any products that contain nicotine or tobacco, such as cigarettes, e-cigarettes, and chewing tobacco. If you need help quitting, ask your doctor. General instructions  Take over-the-counter and prescription medicines only as told by your doctor.  If you were given a machine to use while you sleep, use it only as told by your doctor.  If you are having surgery, make sure to tell your doctor you have sleep apnea. You may need to bring your device with you.  Keep all follow-up visits as told by your doctor. This is important. Contact a doctor if:  The machine that you were given to use during sleep bothers you or does not seem to be working.  You do not get better.  You get worse. Get help right away if:  Your chest hurts.  You have trouble breathing in enough air.  You have an uncomfortable feeling in your back, arms, or stomach.  You have trouble talking.  One side of your body feels weak.  A part of your face is hanging down. These symptoms may be an emergency. Do not wait to see if the symptoms will go away. Get medical help right  away. Call your local emergency services (911 in the [place] Do not drive yourself to the hospital. Summary  This condition affects breathing during sleep.  The most common cause is a collapsed or blocked airway.  The goal of treatment is to help you breathe normally while you sleep. This information is not intended to replace advice given to you by your health care provider. Make sure you discuss any questions you have with your health care provider. Document Revised: 05/11/2018 Document Reviewed: 03/20/2018 Elsevier Patient Education  [place].

## 2020-04-14 NOTE — Progress Notes (Signed)
PATIENT: [name] DOB: [birth year]  REASON FOR VISIT: follow up HISTORY FROM: patient  Chief Complaint  Patient presents with  . Follow-up    F/U on migraines. Was not able to get Ajovy due to price. States stress will trigger migraines.   . room 4    alone     HISTORY OF PRESENT ILLNESS: Today 04/14/20 [name] is a 69 y.o. female here today for follow up for migraines. She was unable to afford Ajovy or Amovig. We have not tried [employer]. She continues to have daily headaches. She feels a lot are triggered by chronic sinus infections. She admits that blood sugars are up and down. Stress definitely contributes. She has chronic pain. She is no longer on opioid therapy. She continues gabapentin and duloxetine.   She does snore. She feels that this is related to her having chronic sinus issues. She occasionally wakes with headache but feels headaches more frequently get worse throughout the day. She denies frequent waking. She denies excessive daytime sleepiness. She denies waking with dry mouth.   She continues regular follow up with PCP, pain management and endocrinology. She is on daily meal replacement insulin as well as daily basil insulin. CBGs "are up and down". Fasting reading this morning was 200. Last A1C 10. BP usually well managed. She continues metoprolol.    HISTORY: (copied from my note on 09/10/2019)  [name] is a 69 y.o. female here today for follow up for new onset headaches. She was started on Ajovy for concerns of daily headaches with migrainous features. MRI normal. She reports that headaches improved in December. She repeated injection in January and does not feel that it was as effective. She has had trouble with sinus infection, sore throat, allergy symptoms and feels that has contributed as well. She no longer has daily headaches. She feels that she had a headache at least 10-15 days last month. Still has a lot of stress. CBG's shot up around 400  after taking doxycycline and prednisone, course completed last week. BP is up and down. She has not taken her BP meds today because she has not felt well. She is having neck pain, s/p ACDF in 04/2019. She continues to follow with Dr Maryjean Ka with [facility]. She has had to take oxycodone more recently due to neck pain.   HISTORY: (copied from Dr Cathren Laine note on 07/10/2019)  Morgan C Mooreis a 69 y.o.femalehere as requested by Harlan Stains, MDfor headache.Past medical history hypertension, DVT on Xarelto, ACDF cervical, diabetes, fibromyalgia, aortic insufficiency. Being sent for headache. Patient was seen in the emergency room in July of this year with elevated high blood pressure complaining of headache on the top of the head in the setting of blood pressure 180s over 90s throughout the day, it was 198/100 before she took her blood pressure medications but never significantly decreased, no other focal neurologic symptoms, chronic pain in her neck, she was taking Mucinex which may have been contributing to her hypertension. CT of the head was negative. Hydralazine was given, suspected headache due to blood pressure elevation, patient was feeling better when blood pressure improved to the 150s to the 160s.  Mother and grandmother had headaches. Headaches recently started 7-8 months ago. No inciting events, no new medications or head trauma or anything she can tie it back to. Gradually started. [place] trigers. She has pain in the face behind the eyes and in the sinuses, she was told she had sinusitis  but Dr. Redmond Baseman ENT said she didn't. The pain comes behind her head, up the neck, and radiates to the temples, feels liks her head is bouncing, pounding and pulsating and throbbing, dull ache, she recently had an ACDF and no changes to the headache since then, not better or worse, she wakes up with the headaches every day, not overly tired during the day, she has headaches every day, she has  tylenol, advil, oxycodone, flexeril, lorazepam, heat, ice, no difference, she reports no significant vision changes, the headache is positional and exertional and bending over for example made her hurt and get dizzy.  Reviewed notes, labs and imaging from outside physicians, which showed:  Personally reviewed CT of the headreportJuly 21, 2020 which showed unremarkable brain.   REVIEW OF SYSTEMS: Out of a complete 14 system review of symptoms, the patient complains only of the following symptoms, chronic headaches, chronic sinusitis, chronic pain, fatigue, anxiety, and all other reviewed systems are negative.   ALLERGIES: Allergies  Allergen Reactions  . Codeine Nausea And Vomiting  . Ambien [Zolpidem Tartrate] Other (See Comments)    Up sleep walking and eating  . Crestor [Rosuvastatin] Other (See Comments)    Muscle weakness, cramps and aching all over body  . Lipitor [Atorvastatin] Itching and Other (See Comments)    Muscle weakness, cramps and aches all over body  . Morphine And Related Other (See Comments)    Flushing and feeling hot Tolerates with Diphenhydramine  . Penicillins Rash and Other (See Comments)    Caused Headaches also Has patient had a PCN reaction causing immediate rash, facial/tongue/throat swelling, SOB or lightheadedness with hypotension: No Has patient had a PCN reaction causing severe rash involving mucus membranes or skin necrosis: No Has patient had a PCN reaction that required hospitalization No Has patient had a PCN reaction occurring within the last 10 years: No If all of the above answers are "NO", then may proceed with Cephalosporin use.    . Statins Other (See Comments)    Muscle weakness, cramps and aching all over body  . Suvorexant     ineffective Other reaction(s): Other ineffective  . Dilaudid [Hydromorphone Hcl] Other (See Comments)    Hallucinations/ argumentative, goes beserk  . Heparin Other (See Comments)    HIT ab positive   (SRA negative 11/25/17)   . Amitriptyline Other (See Comments)    Loopy feeling  . Ceftin [Cefuroxime Axetil] Other (See Comments)    Stomach pain   . Lovaza [Omega-3-Acid Ethyl Esters] Other (See Comments)    Stomach pain   . Lunesta [Eszopiclone] Other (See Comments)    Causes dizziness  . Lyrica [Pregabalin] Nausea Only  . Metformin And Related Nausea Only    HOME MEDICATIONS: Outpatient Medications Prior to Visit  Medication Sig Dispense Refill  . acetaminophen (TYLENOL) 500 MG tablet Take 1,000 mg by mouth every 8 (eight) hours as needed for mild pain or headache.    Marland Kitchen amLODipine (NORVASC) 2.5 MG tablet Take 5 mg by mouth daily.     Marland Kitchen aspirin EC 81 MG tablet Take 81 mg by mouth daily.    . Cholecalciferol (VITAMIN D3) 10 MCG (400 UNIT) CHEW Chew by mouth. OTC    . Cyanocobalamin (B-12) 2500 MCG TABS Take 2,500 mcg by mouth daily.    . cyclobenzaprine (FLEXERIL) 10 MG tablet Take 1 tablet (10 mg total) by mouth 3 (three) times daily as needed for muscle spasms. 30 tablet 0  . dexlansoprazole (DEXILANT) 60 MG capsule  Take 60 mg by mouth daily.    Marland Kitchen docusate sodium (COLACE) 100 MG capsule Take 1 capsule (100 mg total) by mouth 2 (two) times daily. 60 capsule 0  . DULoxetine (CYMBALTA) 60 MG capsule Take 60 mg by mouth daily.     . ferrous sulfate 325 (65 FE) MG tablet Take 1 tablet (325 mg total) by mouth 2 (two) times daily with a meal. (Patient taking differently: Take 325 mg by mouth daily with breakfast. ) 60 tablet 3  . fluticasone (FLONASE) 50 MCG/ACT nasal spray Place 2 sprays into both nostrils daily as needed for allergies or rhinitis.    . furosemide (LASIX) 20 MG tablet Take 20 mg by mouth daily as needed for fluid or edema.     . gabapentin (NEURONTIN) 300 MG capsule Take 600 mg by mouth at bedtime.   1  . ibandronate (BONIVA) 150 MG tablet Take 150 mg by mouth every 30 (thirty) days.    Marland Kitchen ibuprofen (ADVIL) 200 MG tablet Take 200 mg by mouth 3 (three) times daily as needed  for headache (pain).     . insulin glargine (LANTUS) 100 UNIT/ML injection Inject 32 Units into the skin daily. Takes in am    . insulin lispro (HUMALOG KWIKPEN) 100 UNIT/ML KwikPen Inject 18 Units into the skin 3 (three) times daily.     . irbesartan (AVAPRO) 300 MG tablet Take 300 mg by mouth daily.     Marland Kitchen levothyroxine (SYNTHROID, LEVOTHROID) 150 MCG tablet Take 150 mcg by mouth daily before breakfast.  5  . LORazepam (ATIVAN) 0.5 MG tablet TAKE 1 TABLET AS NEEDED FOR ANXIETY EVERY 8 HRS    . Magnesium 100 MG CAPS Take by mouth. OTC    . meclizine (ANTIVERT) 12.5 MG tablet Take 25 mg by mouth 3 (three) times daily as needed for dizziness.    . metoprolol succinate (TOPROL XL) 25 MG 24 hr tablet Take 1 tablet (25 mg total) by mouth daily. 90 tablet 3  . ondansetron (ZOFRAN ODT) 4 MG disintegrating tablet Take 1 tablet (4 mg total) by mouth every 8 (eight) hours as needed for nausea or vomiting. 20 tablet 0  . pantoprazole (PROTONIX) 40 MG tablet Take 40 mg by mouth daily.    . Probiotic Product (PROBIOTIC PO) Take 1 capsule by mouth daily.    . promethazine (PHENERGAN) 25 MG tablet Take 25 mg by mouth every 8 (eight) hours as needed for nausea or vomiting.    . rosuvastatin (CRESTOR) 5 MG tablet Take 1 tablet (5 mg total) by mouth daily. 30 tablet 2  . sucralfate (CARAFATE) 1 g tablet Take 1 g by mouth 4 (four) times daily.    . temazepam (RESTORIL) 15 MG capsule Take 15 mg by mouth at bedtime.   4  . clobetasol (TEMOVATE) 0.05 % external solution Apply 1 application topically. 5 days per week as needed.  3  . Erenumab-aooe 140 MG/ML SOAJ Inject 140 mg into the skin every 30 (thirty) days. 3 pen 3  . Fremanezumab-vfrm (AJOVY) 225 MG/1.5ML SOAJ Inject 225 mg into the skin every 30 (thirty) days. 1 pen 11  . morphine (MSIR) 15 MG tablet Take 0.5 tablets (7.5 mg total) by mouth every 4 (four) hours as needed for severe pain. 3 tablet 0  . oxyCODONE-acetaminophen (PERCOCET/ROXICET) 5-325 MG tablet  Take 1 tablet by mouth every 4 (four) hours as needed (pain). (Patient taking differently: Take 1 tablet by mouth every 6 (six) hours as needed (  pain). ) 30 tablet 0  . sucralfate (CARAFATE) 1 GM/10ML suspension Take 1 g by mouth 4 (four) times daily.      No facility-administered medications prior to visit.    PAST MEDICAL HISTORY: Past Medical History:  Diagnosis Date  . Anemia    iron deficiency  . Arthritis   . Blood transfusion   . CAD (coronary artery disease), native coronary artery    coronary CTA showed a coronary Ca score of 506, mild CAD with 25-49% mRCA and moderate CAD with 50-69% sequential mRCA, 25-49% inferior sub branch of D1 and 25-49% in pLCx.    . Cervical spondylosis with myelopathy and radiculopathy   . CVA (cerebral vascular accident) ([place])    11/2017  . Depression   . Diabetes mellitus    Type II  . Diastolic dysfunction   . Diverticulitis   . DJD (degenerative joint disease)   . DVT (deep venous thrombosis) ([facility])    times 2 lower leg  . Endometriosis   . Family history of adverse reaction to anesthesia    mom and dad PONV  . Fibromyalgia   . GERD (gastroesophageal reflux disease)    occ  . Hiatal hernia   . History of kidney stones   . Hyperlipidemia   . Hypertension   . Hypothyroidism   . Insomnia   . Mild aortic insufficiency    by echo 04/2013  . Osteoarthritis    back and knee  . Ovarian cyst   . PONV (postoperative nausea and vomiting)   . RLS (restless legs syndrome)   . Thoracic compression fracture ([place])   . Uterine fibroid   . UTI (lower urinary tract infection)   . Villous adenoma of right colon 04/08/2016  . Vitamin D deficiency disease     PAST SURGICAL HISTORY: Past Surgical History:  Procedure Laterality Date  . ANTERIOR CERVICAL DECOMP/DISCECTOMY FUSION N/A 03/23/2017   Procedure: ANTERIOR CERVICAL DECOMPRESSION/DISCECTOMY FUSION, INTERBODY PROSTHESIS,PLATE CERVICAL THREE- CERVICAL FOUR, CERVICAL FOUR- CERVICAL FIVE, CERVICAL  FIVE- CERVICAL SIX;  Surgeon: Newman Pies, MD;  Location: [facility];  Service: Neurosurgery;  Laterality: N/A;  . ANTERIOR CERVICAL DECOMP/DISCECTOMY FUSION N/A 04/18/2019   Procedure: ANTERIOR CERVICAL DECOMPRESSION/DISCECTOMY  CERVICAL SIX- CERVICAL SEVEN;  Surgeon: Newman Pies, MD;  Location: [facility];  Service: Neurosurgery;  Laterality: N/A;  anterior  . APPENDECTOMY    . BACK SURGERY  ,2005   April  2013 - spinal fusion[facility]  . bowel blockage surgery    . BREAST SURGERY     breast reduction  . CARDIAC CATHETERIZATION  04/2013   normal coronary arteries and normal LVF  . CARPAL TUNNEL RELEASE  06/05/2012   Procedure: CARPAL TUNNEL RELEASE;  Surgeon: Cammie Sickle., MD;  Location: [facility];  Service: Orthopedics;  Laterality: Left;  . CESAREAN SECTION     x 2  . CHOLECYSTECTOMY    . COLECTOMY  04/08/2016  . DILATION AND CURETTAGE OF UTERUS    . DORSAL COMPARTMENT RELEASE  06/05/2012   Procedure: RELEASE DORSAL COMPARTMENT (DEQUERVAIN);  Surgeon: Cammie Sickle., MD;  Location: [facility];  Service: Orthopedics;  Laterality: Left;  Excision of mixoid cyst also  . EYE SURGERY     cataracts bilateral  . JOINT REPLACEMENT     Bilateral knee  . KNEE ARTHROPLASTY  09   lft partial  . KNEE ARTHROPLASTY     rt  . LAPAROSCOPIC PARTIAL COLECTOMY N/A 04/08/2016   Procedure:  LAPAROSCOPIC ASSISTED ASCENDING COLECTOMY POSSIBLE OPEN COLECTOMY;  Surgeon: Fanny Skates, MD;  Location: [facility];  Service: General;  Laterality: N/A;  . orthopedic surgeries     multiple  . REDUCTION MAMMAPLASTY Bilateral   . SHOULDER OPEN ROTATOR CUFF REPAIR     rt and lft  . TEE WITHOUT CARDIOVERSION N/A 11/23/2017   Procedure: TRANSESOPHAGEAL ECHOCARDIOGRAM (TEE);  Surgeon: Jerline Pain, MD;  Location: [facility] ENDOSCOPY;  Service: Cardiovascular;  Laterality: N/A;  . TUBAL LIGATION     btsp    FAMILY HISTORY: Family History  Problem Relation Age of Onset  . Heart  attack Father        50s  . Hypothyroidism Father   . Diabetes type II Father   . Kidney failure Father   . Diabetes type II Mother   . Hypertension Mother   . Breast cancer Mother 80  . Dementia Mother   . Hypothyroidism Brother   . Diabetes Brother   . Breast cancer Maternal Aunt   . Breast cancer Cousin   . Breast cancer Maternal Aunt   . Breast cancer Cousin   . High blood pressure Other        "all"  . Heart attack Brother   . Lung cancer Brother     SOCIAL HISTORY: Social History   Socioeconomic History  . Marital status: Married    Spouse name: Not on file  . Number of children: 2  . Years of education: trade school  . Highest education level: Not on file  Occupational History  . Not on file  Tobacco Use  . Smoking status: Never Smoker  . Smokeless tobacco: Never Used  Vaping Use  . Vaping Use: Never used  Substance and Sexual Activity  . Alcohol use: Never  . Drug use: Never  . Sexual activity: Yes    Birth control/protection: Post-menopausal, Surgical  Other Topics Concern  . Not on file  Social History Narrative   Lives at home with her husband   Right handed   Caffeine: 2-3 cups of coffee/day   Social Determinants of Health   Financial Resource Strain:   . Difficulty of Paying Living Expenses: Not on file  Food Insecurity:   . Worried About Charity fundraiser in the Last Year: Not on file  . Ran Out of Food in the Last Year: Not on file  Transportation Needs:   . Lack of Transportation (Medical): Not on file  . Lack of Transportation (Non-Medical): Not on file  Physical Activity:   . Days of Exercise per Week: Not on file  . Minutes of Exercise per Session: Not on file  Stress:   . Feeling of Stress : Not on file  Social Connections:   . Frequency of Communication with Friends and Family: Not on file  . Frequency of Social Gatherings with Friends and Family: Not on file  . Attends Religious Services: Not on file  . Active Member of Clubs  or Organizations: Not on file  . Attends Archivist Meetings: Not on file  . Marital Status: Not on file  Intimate Partner Violence:   . Fear of Current or Ex-Partner: Not on file  . Emotionally Abused: Not on file  . Physically Abused: Not on file  . Sexually Abused: Not on file      PHYSICAL EXAM  Vitals:   04/14/20 1136  BP: 128/84  Pulse: 86  Weight: 213 lb 3.2 oz (96.7 kg)  Height: 5\' 3"  (  1.6 m)   Body mass index is 37.77 kg/m.  Generalized: Well developed, in no acute distress  Cardiology: normal rate and rhythm, no murmur noted Respiratory: clear to auscultation bilaterally  Neurological examination  Mentation: Alert oriented to time, place, history taking. Follows all commands speech and language fluent Cranial nerve II-XII: Pupils were equal round reactive to light. Extraocular movements were full, visual field were full on confrontational test. Facial sensation and strength were normal. Uvula tongue midline. Head turning and shoulder shrug  were normal and symmetric. Motor: The motor testing reveals 5 over 5 strength of all 4 extremities. Good symmetric motor tone is noted throughout.  Gait and station: Gait is normal.    DIAGNOSTIC DATA (LABS, IMAGING, TESTING) - I reviewed patient records, labs, notes, testing and imaging myself where available.  No flowsheet data found.   Lab Results  Component Value Date   WBC 7.9 12/02/2019   HGB 14.0 12/02/2019   HCT 42.2 12/02/2019   MCV 86.7 12/02/2019   PLT 302 12/02/2019      Component Value Date/Time   NA 133 (L) 02/18/2020 1119   K 4.7 02/18/2020 1119   CL 96 02/18/2020 1119   CO2 25 02/18/2020 1119   GLUCOSE 434 (H) 02/18/2020 1119   GLUCOSE 206 (H) 12/02/2019 1523   BUN 14 02/18/2020 1119   CREATININE 0.93 02/18/2020 1119   CALCIUM 9.4 02/18/2020 1119   PROT 7.0 12/02/2019 1523   PROT 6.5 07/10/2019 1442   ALBUMIN 3.8 12/02/2019 1523   ALBUMIN 4.1 07/10/2019 1442   AST 17 12/02/2019 1523     ALT 15 12/02/2019 1523   ALKPHOS 125 12/02/2019 1523   BILITOT 0.4 12/02/2019 1523   BILITOT 0.4 07/10/2019 1442   GFRNONAA 63 02/18/2020 1119   GFRAA 73 02/18/2020 1119   Lab Results  Component Value Date   CHOL 162 03/25/2020   HDL 48 03/25/2020   LDLCALC 94 03/25/2020   TRIG 109 03/25/2020   CHOLHDL 3.4 03/25/2020   Lab Results  Component Value Date   HGBA1C 10.1 (H) 07/10/2019   No results found for: VITAMINB12 Lab Results  Component Value Date   TSH 1.310 07/10/2019       ASSESSMENT AND PLAN 69 y.o. year old female  has a past medical history of Anemia, Arthritis, Blood transfusion, CAD (coronary artery disease), native coronary artery, Cervical spondylosis with myelopathy and radiculopathy, CVA (cerebral vascular accident) ([place]), Depression, Diabetes mellitus, Diastolic dysfunction, Diverticulitis, DJD (degenerative joint disease), DVT (deep venous thrombosis) ([place]), Endometriosis, Family history of adverse reaction to anesthesia, Fibromyalgia, GERD (gastroesophageal reflux disease), Hiatal hernia, History of kidney stones, Hyperlipidemia, Hypertension, Hypothyroidism, Insomnia, Mild aortic insufficiency, Osteoarthritis, Ovarian cyst, PONV (postoperative nausea and vomiting), RLS (restless legs syndrome), Thoracic compression fracture ([place]), Uterine fibroid, UTI (lower urinary tract infection), Villous adenoma of right colon (04/08/2016), and Vitamin D deficiency disease. here with     ICD-10-CM   1. Chronic migraine without aura without status migrainosus, not intractable  G43.709     Lady continues to have chronic daily headaches of varying intensity and pain description. She was unable to afford Ajovy and is uncertain if this helped. She was unable to start Amovig due to cost. We will start Emgality. She was educated on correct dosing and potential side effects. She was advised to call me with progress report in 2 weeks. We have discussed concerns of sleep apnea in  detail. Although she denies most red flags for sleep apnea, I am concerned  that body habitus, Mallampati VI and chronic co morbidities make her at an increased risk and would recommend a sleep study. She is hesitant but will consider. I have provided her with education materials to read and asked her to call me should she change her mind. I will be happy to refer her to sleep medicine for formal testing if she wishes. We have discussed the option to consider Botox for migraine management. She is hesitant of this procedure due to fear of needles. We have also discussed correlation of co morbidities and chronic headaches. I have encouraged her to continue close follow up with PCP, pain management and endocrinology. Healthy lifestyle habits discussed. She will follow up with me in 3 months, sooner if needed.    No orders of the defined types were placed in this encounter.    Meds ordered this encounter  Medications  . Galcanezumab-gnlm (EMGALITY) 120 MG/ML SOAJ    Sig: Inject 120 mg into the skin every 30 (thirty) days.    Dispense:  2 mL    Refill:  0    Order Specific Question:   Supervising Provider    Answer:   Melvenia Beam [id number]      I spent 30 minutes with the patient. 50% of this time was spent counseling and educating patient on plan of care and medications.     Debbora Presto, FNP-C 04/14/2020, 12:51 PM [facility] [address], [place], [place] [address] [phone]

## 2020-04-16 ENCOUNTER — Other Ambulatory Visit: Payer: Self-pay | Admitting: Cardiology

## 2020-04-16 DIAGNOSIS — K5710 Diverticulosis of small intestine without perforation or abscess without bleeding: Secondary | ICD-10-CM | POA: Diagnosis not present

## 2020-04-16 DIAGNOSIS — E785 Hyperlipidemia, unspecified: Secondary | ICD-10-CM

## 2020-04-16 DIAGNOSIS — K429 Umbilical hernia without obstruction or gangrene: Secondary | ICD-10-CM | POA: Diagnosis not present

## 2020-04-16 DIAGNOSIS — R109 Unspecified abdominal pain: Secondary | ICD-10-CM | POA: Diagnosis not present

## 2020-04-16 DIAGNOSIS — K56699 Other intestinal obstruction unspecified as to partial versus complete obstruction: Secondary | ICD-10-CM | POA: Diagnosis not present

## 2020-04-18 DIAGNOSIS — Z20822 Contact with and (suspected) exposure to covid-19: Secondary | ICD-10-CM | POA: Diagnosis not present

## 2020-04-18 DIAGNOSIS — R05 Cough: Secondary | ICD-10-CM | POA: Diagnosis not present

## 2020-04-18 DIAGNOSIS — J029 Acute pharyngitis, unspecified: Secondary | ICD-10-CM | POA: Diagnosis not present

## 2020-04-23 ENCOUNTER — Telehealth: Payer: Self-pay | Admitting: Family Medicine

## 2020-04-23 ENCOUNTER — Other Ambulatory Visit: Payer: Self-pay | Admitting: Family Medicine

## 2020-04-23 MED ORDER — IMIPRAMINE HCL 25 MG PO TABS: ORAL_TABLET | ORAL | 5 refills | Status: DC

## 2020-04-23 NOTE — Telephone Encounter (Signed)
I called pt and she is suffering with sinus infections, feels weak, sound  terrible. covid test negative, although some family members have covid.  She states she cannot afford emaglity (takes 2 insulin meds) cost over $180.  She is taking tylenol arthritis 650mg  and is helping some.  I offered that she can apply for PAP (if qualifies) for copay assistance and she states that she would not qualify.  Change treatment plan?

## 2020-04-23 NOTE — Telephone Encounter (Signed)
I would love if she could try to qualify for PAP with Emgality. We can definitely consider a different oral preventative. I would suggest imipramine 25-50mg  at bedtime. May be good idea to start 25mg  every night for 5-7 nights then increase if she is doing well. Most common side effect is sleepiness. She is also taking duloxetine. These two are similar medications but I think it is ok to use low doses together. We are limited in options due to her medical history. I will call in for her. Have her let us know if symptoms worsen or do not improve.

## 2020-04-23 NOTE — Telephone Encounter (Signed)
Pt has called to report she will not be able to afford the Galcanezumab-gnlm (EMGALITY) 120 MG/ML SOAJ, she was told it will be $180.00 a month.  Please call pt to discuss other options.

## 2020-04-23 NOTE — Telephone Encounter (Signed)
Spoke to pt and relayed the plan for imipramine 25-50mg  po qhs,  Take 25mg  po qhs for 5-7 days if tolerates can go to 50mg  po qhs.  Similar to duloxetine (both lower doses) ok to take both.  Not a whole lot of options for her to try due to med hx she verbalized understanding.  Will call back if needed or worsens.

## 2020-05-05 DIAGNOSIS — E039 Hypothyroidism, unspecified: Secondary | ICD-10-CM | POA: Diagnosis not present

## 2020-05-05 DIAGNOSIS — Z794 Long term (current) use of insulin: Secondary | ICD-10-CM | POA: Diagnosis not present

## 2020-05-05 DIAGNOSIS — E1065 Type 1 diabetes mellitus with hyperglycemia: Secondary | ICD-10-CM | POA: Diagnosis not present

## 2020-05-05 DIAGNOSIS — E669 Obesity, unspecified: Secondary | ICD-10-CM | POA: Diagnosis not present

## 2020-05-28 DIAGNOSIS — E039 Hypothyroidism, unspecified: Secondary | ICD-10-CM | POA: Diagnosis not present

## 2020-05-28 DIAGNOSIS — E1065 Type 1 diabetes mellitus with hyperglycemia: Secondary | ICD-10-CM | POA: Diagnosis not present

## 2020-05-29 DIAGNOSIS — H40053 Ocular hypertension, bilateral: Secondary | ICD-10-CM | POA: Diagnosis not present

## 2020-05-29 DIAGNOSIS — E119 Type 2 diabetes mellitus without complications: Secondary | ICD-10-CM | POA: Diagnosis not present

## 2020-06-11 ENCOUNTER — Telehealth: Payer: Self-pay

## 2020-06-11 ENCOUNTER — Other Ambulatory Visit: Payer: Self-pay | Admitting: Student

## 2020-06-11 DIAGNOSIS — M5442 Lumbago with sciatica, left side: Secondary | ICD-10-CM

## 2020-06-11 DIAGNOSIS — G8929 Other chronic pain: Secondary | ICD-10-CM

## 2020-06-11 NOTE — Telephone Encounter (Signed)
Phone call to patient to verify medication list and allergies for myelogram procedure. Pt instructed to hold Cymbalta, phenergan, and Tofranil for 48hrs prior to myelogram appointment time and 24 hours after appointment. Pt also instructed to have a driver the day of the procedure, the procedure would take around 2 hours, and discharge instructions discussed. Pt verbalized understanding.

## 2020-06-15 ENCOUNTER — Ambulatory Visit
Admission: RE | Admit: 2020-06-15 | Discharge: 2020-06-15 | Disposition: A | Discharge status: Home / Self Care | Payer: Medicare Other | Source: Ambulatory Visit | Attending: Student | Admitting: Student

## 2020-06-15 DIAGNOSIS — G8929 Other chronic pain: Secondary | ICD-10-CM

## 2020-06-15 DIAGNOSIS — M5116 Intervertebral disc disorders with radiculopathy, lumbar region: Secondary | ICD-10-CM | POA: Diagnosis not present

## 2020-06-15 DIAGNOSIS — M5442 Lumbago with sciatica, left side: Secondary | ICD-10-CM

## 2020-06-15 DIAGNOSIS — M5416 Radiculopathy, lumbar region: Secondary | ICD-10-CM | POA: Diagnosis not present

## 2020-06-15 DIAGNOSIS — M4326 Fusion of spine, lumbar region: Secondary | ICD-10-CM | POA: Diagnosis not present

## 2020-06-15 MED ORDER — ONDANSETRON HCL 4 MG/2ML IJ SOLN
4.0000 mg | Freq: Once | INTRAMUSCULAR | Status: AC
  Administered 2020-06-15: 4 mg via INTRAMUSCULAR

## 2020-06-15 MED ORDER — DIAZEPAM 5 MG PO TABS
5.0000 mg | ORAL_TABLET | Freq: Once | ORAL | Status: AC
  Administered 2020-06-15: 5 mg via ORAL

## 2020-06-15 MED ORDER — IOPAMIDOL (ISOVUE-M 200) INJECTION 41%
18.0000 mL | Freq: Once | INTRAMUSCULAR | Status: AC
  Administered 2020-06-15: 18 mL via INTRATHECAL

## 2020-06-15 NOTE — Discharge Instructions (Signed)
Myelogram Discharge Instructions  1. Go home and rest quietly for the next 24 hours.  It is important to lie flat for the next 24 hours.  Get up only to go to the restroom.  You may lie in the bed or on a couch on your back, your stomach, your left side or your right side.  You may have one pillow under your head.  You may have pillows between your knees while you are on your side or under your knees while you are on your back.  2. DO NOT drive today.  Recline the seat as far back as it will go, while still wearing your seat belt, on the way home.  3. You may get up to go to the bathroom as needed.  You may sit up for 10 minutes to eat.  You may resume your normal diet and medications unless otherwise indicated.  Drink lots of extra fluids today and tomorrow.  4. The incidence of headache, nausea, or vomiting is about 5% (one in 20 patients).  If you develop a headache, lie flat and drink plenty of fluids until the headache goes away.  Caffeinated beverages may be helpful.  If you develop severe nausea and vomiting or a headache that does not go away with flat bed rest, call [phone].  5. You may resume normal activities after your 24 hours of bed rest is over; however, do not exert yourself strongly or do any heavy lifting tomorrow. If when you get up you have a headache when standing, go back to bed and force fluids for another 24 hours.  6. Call your physician for a follow-up appointment.  The results of your myelogram will be sent directly to your physician by the following day.  7. If you have any questions or if complications develop after you arrive home, please call [phone].  Discharge instructions have been explained to the patient.  The patient, or the person responsible for the patient, fully understands these instructions  YOU MAY [place], TOFRANIL, AND PHENERGAN TOMORROW 06/16/20 AT 930 AM.

## 2020-06-15 NOTE — Progress Notes (Signed)
Patient states she has been off Cymbalta, Imipramine and Phenergan for at least the past two days.

## 2020-06-16 DIAGNOSIS — I10 Essential (primary) hypertension: Secondary | ICD-10-CM | POA: Diagnosis not present

## 2020-06-16 DIAGNOSIS — Z981 Arthrodesis status: Secondary | ICD-10-CM | POA: Diagnosis not present

## 2020-06-16 DIAGNOSIS — G8929 Other chronic pain: Secondary | ICD-10-CM | POA: Diagnosis not present

## 2020-06-16 DIAGNOSIS — M5442 Lumbago with sciatica, left side: Secondary | ICD-10-CM | POA: Diagnosis not present

## 2020-06-16 DIAGNOSIS — M5441 Lumbago with sciatica, right side: Secondary | ICD-10-CM | POA: Diagnosis not present

## 2020-06-16 DIAGNOSIS — Z6838 Body mass index (BMI) 38.0-38.9, adult: Secondary | ICD-10-CM | POA: Diagnosis not present

## 2020-06-25 DIAGNOSIS — K219 Gastro-esophageal reflux disease without esophagitis: Secondary | ICD-10-CM | POA: Diagnosis not present

## 2020-06-25 DIAGNOSIS — Z86010 Personal history of colonic polyps: Secondary | ICD-10-CM | POA: Diagnosis not present

## 2020-07-08 DIAGNOSIS — Z6838 Body mass index (BMI) 38.0-38.9, adult: Secondary | ICD-10-CM | POA: Diagnosis not present

## 2020-07-08 DIAGNOSIS — G894 Chronic pain syndrome: Secondary | ICD-10-CM | POA: Diagnosis not present

## 2020-07-08 DIAGNOSIS — Z9889 Other specified postprocedural states: Secondary | ICD-10-CM | POA: Diagnosis not present

## 2020-07-08 DIAGNOSIS — I10 Essential (primary) hypertension: Secondary | ICD-10-CM | POA: Diagnosis not present

## 2020-07-09 ENCOUNTER — Ambulatory Visit: Payer: Medicare Other

## 2020-07-13 DIAGNOSIS — Z794 Long term (current) use of insulin: Secondary | ICD-10-CM | POA: Diagnosis not present

## 2020-07-13 DIAGNOSIS — E039 Hypothyroidism, unspecified: Secondary | ICD-10-CM | POA: Diagnosis not present

## 2020-07-13 DIAGNOSIS — E1065 Type 1 diabetes mellitus with hyperglycemia: Secondary | ICD-10-CM | POA: Diagnosis not present

## 2020-07-13 DIAGNOSIS — E669 Obesity, unspecified: Secondary | ICD-10-CM | POA: Diagnosis not present

## 2020-07-16 ENCOUNTER — Ambulatory Visit (INDEPENDENT_AMBULATORY_CARE_PROVIDER_SITE_OTHER): Payer: Medicare Other | Admitting: Adult Health

## 2020-07-16 ENCOUNTER — Encounter: Payer: Self-pay | Admitting: Adult Health

## 2020-07-16 VITALS — BP 161/69 | HR 98 | Ht 61.0 in | Wt 220.0 lb

## 2020-07-16 DIAGNOSIS — G43709 Chronic migraine without aura, not intractable, without status migrainosus: Secondary | ICD-10-CM

## 2020-07-16 DIAGNOSIS — I2510 Atherosclerotic heart disease of native coronary artery without angina pectoris: Secondary | ICD-10-CM | POA: Diagnosis not present

## 2020-07-16 MED ORDER — IMIPRAMINE HCL 50 MG PO TABS: 50.0000 mg | ORAL_TABLET | Freq: Every day | ORAL | 3 refills | Status: DC

## 2020-07-16 NOTE — Patient Instructions (Addendum)
Your Plan:  Continue Imipramine 50 mg at bedtime If your symptoms worsen or you develop new symptoms please let us know.    Thank you for coming to see [place] at [facility]. I hope we have been able to provide you high quality care today.  You may receive a patient satisfaction survey over the next few weeks. We would appreciate your feedback and comments so that we may continue to improve ourselves and the health of our patients.

## 2020-07-16 NOTE — Progress Notes (Deleted)
No chief complaint on file.    HISTORY OF PRESENT ILLNESS: Today 07/16/20  [name] is a 69 y.o. female here today for follow up for migraines. We attempted to get Emgality covered but it was too expensive. We started imipramine in 04/2020.    HISTORY (copied from previous note)  [name] is a 69 y.o. female here today for follow up for migraines. She was unable to afford Ajovy or Amovig. We have not tried [employer]. She continues to have daily headaches. She feels a lot are triggered by chronic sinus infections. She admits that blood sugars are up and down. Stress definitely contributes. She has chronic pain. She is no longer on opioid therapy. She continues gabapentin and duloxetine.   She does snore. She feels that this is related to her having chronic sinus issues. She occasionally wakes with headache but feels headaches more frequently get worse throughout the day. She denies frequent waking. She denies excessive daytime sleepiness. She denies waking with dry mouth.   She continues regular follow up with PCP, pain management and endocrinology. She is on daily meal replacement insulin as well as daily basil insulin. CBGs "are up and down". Fasting reading this morning was 200. Last A1C 10. BP usually well managed. She continues metoprolol.    HISTORY: (copied from my note on 09/10/2019)  [name] a 69 y.o.femalehere today for follow up for new onset headaches. She was started on Ajovy for concerns of daily headaches with migrainous features. MRI normal.She reports that headaches improved in December. She repeated injection in January and does not feel that it was as effective. She has had trouble with sinus infection, sore throat, allergy symptoms and feels that has contributed as well. She no longer has daily headaches. She feels that she had a headache at least 10-15 days last month. Still has a lot of stress. CBG's shot up around 400 after taking doxycycline  and prednisone, course completed last week. BP is up and down. She has not taken her BP meds today because she has not felt well. She is having neck pain, s/p ACDF in 04/2019. She continues to follow with Dr Maryjean Ka with [facility]. She has had to take oxycodone more recently due to neck pain.   HISTORY: (copied fromDr Ahern'snote on 07/10/2019)  Morgan C [name] a 69 y.o.femalehere as requested by Harlan Stains, MDfor headache.Past medical history hypertension, DVT on Xarelto, ACDF cervical, diabetes, fibromyalgia, aortic insufficiency. Being sent for headache. Patient was seen in the emergency room in July of this year with elevated high blood pressure complaining of headache on the top of the head in the setting of blood pressure 180s over 90s throughout the day, it was 198/100 before she took her blood pressure medications but never significantly decreased, no other focal neurologic symptoms, chronic pain in her neck, she was taking Mucinex which may have been contributing to her hypertension. CT of the head was negative. Hydralazine was given, suspected headache due to blood pressure elevation, patient was feeling better when blood pressure improved to the 150s to the 160s.  Mother and grandmother had headaches. Headaches recently started 7-8 months ago. No inciting events, no new medications or head trauma or anything she can tie it back to. Gradually started. [place] trigers. She has pain in the face behind the eyes and in the sinuses, she was told she had sinusitis but Dr. Redmond [facility] said she didn't. The pain comes behind her head, up the  neck, and radiates to the temples, feels liks her head is bouncing, pounding and pulsating and throbbing, dull ache, she recently had an ACDF and no changes to the headache since then, not better or worse, she wakes up with the headaches every day, not overly tired during the day, she has headaches every day, she has tylenol, advil, oxycodone,  flexeril, lorazepam, heat, ice, no difference, she reports no significant vision changes, the headache is positional and exertional and bending over for example made her hurt and get dizzy.  Reviewed notes, labs and imaging from outside physicians, which showed:  Personally reviewed CT of the headreportJuly 21, 2020 which showed unremarkable brain.    REVIEW OF SYSTEMS: Out of a complete 14 system review of symptoms, the patient complains only of the following symptoms, and all other reviewed systems are negative.   ALLERGIES: Allergies  Allergen Reactions  . Dilaudid [Hydromorphone Hcl] Other (See Comments)    Hallucinations/ argumentative, goes beserk  . Heparin Other (See Comments)    HIT ab positive  (SRA negative 11/25/17)   . Ambien [Zolpidem Tartrate] Other (See Comments)    Up sleep walking and eating  . Codeine Nausea And Vomiting  . Morphine And Related Other (See Comments)    Flushing and feeling hot Tolerates with Diphenhydramine  . Penicillins Rash and Other (See Comments)    Caused Headaches also Has patient had a PCN reaction causing immediate rash, facial/tongue/throat swelling, SOB or lightheadedness with hypotension: No Has patient had a PCN reaction causing severe rash involving mucus membranes or skin necrosis: No Has patient had a PCN reaction that required hospitalization No Has patient had a PCN reaction occurring within the last 10 years: No If all of the above answers are "NO", then may proceed with Cephalosporin use.    . Statins Other (See Comments)    Muscle weakness, cramps and aching all over body  . Suvorexant     ineffective Other reaction(s): Other ineffective  . Amitriptyline Other (See Comments)    Loopy feeling  . Ceftin [Cefuroxime Axetil] Other (See Comments)    Stomach pain   . Lovaza [Omega-3-Acid Ethyl Esters] Other (See Comments)    Stomach pain   . Lunesta [Eszopiclone] Other (See Comments)    Causes dizziness  . Lyrica  [Pregabalin] Nausea Only  . Metformin And Related Nausea Only     HOME MEDICATIONS: Outpatient Medications Prior to Visit  Medication Sig Dispense Refill  . acetaminophen (TYLENOL) 500 MG tablet Take 1,000 mg by mouth every 8 (eight) hours as needed for mild pain or headache.    Marland Kitchen amLODipine (NORVASC) 2.5 MG tablet Take 5 mg by mouth daily.     Marland Kitchen aspirin EC 81 MG tablet Take 81 mg by mouth daily.    . Cholecalciferol (VITAMIN D3) 10 MCG (400 UNIT) CHEW Chew by mouth. OTC    . Cyanocobalamin (B-12) 2500 MCG TABS Take 2,500 mcg by mouth daily.    . cyclobenzaprine (FLEXERIL) 10 MG tablet Take 1 tablet (10 mg total) by mouth 3 (three) times daily as needed for muscle spasms. 30 tablet 0  . dexlansoprazole (DEXILANT) 60 MG capsule Take 60 mg by mouth daily.    Marland Kitchen docusate sodium (COLACE) 100 MG capsule Take 1 capsule (100 mg total) by mouth 2 (two) times daily. 60 capsule 0  . DULoxetine (CYMBALTA) 60 MG capsule Take 60 mg by mouth daily.     . ferrous sulfate 325 (65 FE) MG tablet Take 1 tablet (  325 mg total) by mouth 2 (two) times daily with a meal. (Patient taking differently: Take 325 mg by mouth daily with breakfast. ) 60 tablet 3  . fluticasone (FLONASE) 50 MCG/ACT nasal spray Place 2 sprays into both nostrils daily as needed for allergies or rhinitis.    . furosemide (LASIX) 20 MG tablet Take 20 mg by mouth daily as needed for fluid or edema.     . gabapentin (NEURONTIN) 300 MG capsule Take 600 mg by mouth at bedtime.   1  . Galcanezumab-gnlm (EMGALITY) 120 MG/ML SOAJ Inject 120 mg into the skin every 30 (thirty) days. 2 mL 0  . ibandronate (BONIVA) 150 MG tablet Take 150 mg by mouth every 30 (thirty) days.    Marland Kitchen ibuprofen (ADVIL) 200 MG tablet Take 200 mg by mouth 3 (three) times daily as needed for headache (pain).  (Patient not taking: Reported on 06/11/2020)    . imipramine (TOFRANIL) 25 MG tablet Take 1-2 tablets daily at bedtime 60 tablet 5  . insulin glargine (LANTUS) 100 UNIT/ML  injection Inject 32 Units into the skin daily. Takes in am    . insulin lispro (HUMALOG KWIKPEN) 100 UNIT/ML KwikPen Inject 18 Units into the skin 3 (three) times daily.     . irbesartan (AVAPRO) 300 MG tablet Take 300 mg by mouth daily.     Marland Kitchen levothyroxine (SYNTHROID, LEVOTHROID) 150 MCG tablet Take 150 mcg by mouth daily before breakfast.  5  . LORazepam (ATIVAN) 0.5 MG tablet TAKE 1 TABLET AS NEEDED FOR ANXIETY EVERY 8 HRS    . Magnesium 100 MG CAPS Take by mouth. OTC    . meclizine (ANTIVERT) 12.5 MG tablet Take 25 mg by mouth 3 (three) times daily as needed for dizziness.    . metoprolol succinate (TOPROL XL) 25 MG 24 hr tablet Take 1 tablet (25 mg total) by mouth daily. 90 tablet 3  . ondansetron (ZOFRAN ODT) 4 MG disintegrating tablet Take 1 tablet (4 mg total) by mouth every 8 (eight) hours as needed for nausea or vomiting. 20 tablet 0  . pantoprazole (PROTONIX) 40 MG tablet Take 40 mg by mouth daily.    . Probiotic Product (PROBIOTIC PO) Take 1 capsule by mouth daily.    . promethazine (PHENERGAN) 25 MG tablet Take 25 mg by mouth every 8 (eight) hours as needed for nausea or vomiting.    . rosuvastatin (CRESTOR) 5 MG tablet TAKE 1 TABLET BY MOUTH EVERY DAY 90 tablet 3  . sucralfate (CARAFATE) 1 g tablet Take 1 g by mouth 4 (four) times daily.    . temazepam (RESTORIL) 15 MG capsule Take 15 mg by mouth at bedtime.   4   No facility-administered medications prior to visit.     PAST MEDICAL HISTORY: Past Medical History:  Diagnosis Date  . Anemia    iron deficiency  . Arthritis   . Blood transfusion   . CAD (coronary artery disease), native coronary artery    coronary CTA showed a coronary Ca score of 506, mild CAD with 25-49% mRCA and moderate CAD with 50-69% sequential mRCA, 25-49% inferior sub branch of D1 and 25-49% in pLCx.    . Cervical spondylosis with myelopathy and radiculopathy   . CVA (cerebral vascular accident) ([place])    11/2017  . Depression   . Diabetes mellitus     Type II  . Diastolic dysfunction   . Diverticulitis   . DJD (degenerative joint disease)   . DVT (deep venous thrombosis) (  HCC)    times 2 lower leg  . Endometriosis   . Family history of adverse reaction to anesthesia    mom and dad PONV  . Fibromyalgia   . GERD (gastroesophageal reflux disease)    occ  . Hiatal hernia   . History of kidney stones   . Hyperlipidemia   . Hypertension   . Hypothyroidism   . Insomnia   . Mild aortic insufficiency    by echo 04/2013  . Osteoarthritis    back and knee  . Ovarian cyst   . PONV (postoperative nausea and vomiting)   . RLS (restless legs syndrome)   . Thoracic compression fracture ([place])   . Uterine fibroid   . UTI (lower urinary tract infection)   . Villous adenoma of right colon 04/08/2016  . Vitamin D deficiency disease      PAST SURGICAL HISTORY: Past Surgical History:  Procedure Laterality Date  . ANTERIOR CERVICAL DECOMP/DISCECTOMY FUSION N/A 03/23/2017   Procedure: ANTERIOR CERVICAL DECOMPRESSION/DISCECTOMY FUSION, INTERBODY PROSTHESIS,PLATE CERVICAL THREE- CERVICAL FOUR, CERVICAL FOUR- CERVICAL FIVE, CERVICAL FIVE- CERVICAL SIX;  Surgeon: Newman Pies, MD;  Location: [place];  Service: Neurosurgery;  Laterality: N/A;  . ANTERIOR CERVICAL DECOMP/DISCECTOMY FUSION N/A 04/18/2019   Procedure: ANTERIOR CERVICAL DECOMPRESSION/DISCECTOMY  CERVICAL SIX- CERVICAL SEVEN;  Surgeon: Newman Pies, MD;  Location: [place];  Service: Neurosurgery;  Laterality: N/A;  anterior  . APPENDECTOMY    . BACK SURGERY  ,2005   April  2013 - spinal fusion[facility]  . bowel blockage surgery    . BREAST SURGERY     breast reduction  . CARDIAC CATHETERIZATION  04/2013   normal coronary arteries and normal LVF  . CARPAL TUNNEL RELEASE  06/05/2012   Procedure: CARPAL TUNNEL RELEASE;  Surgeon: Cammie Sickle., MD;  Location: [place];  Service: Orthopedics;  Laterality: Left;  . CESAREAN SECTION     x 2  . CHOLECYSTECTOMY    .  COLECTOMY  04/08/2016  . DILATION AND CURETTAGE OF UTERUS    . DORSAL COMPARTMENT RELEASE  06/05/2012   Procedure: RELEASE DORSAL COMPARTMENT (DEQUERVAIN);  Surgeon: Cammie Sickle., MD;  Location: [facility];  Service: Orthopedics;  Laterality: Left;  Excision of mixoid cyst also  . EYE SURGERY     cataracts bilateral  . JOINT REPLACEMENT     Bilateral knee  . KNEE ARTHROPLASTY  09   lft partial  . KNEE ARTHROPLASTY     rt  . LAPAROSCOPIC PARTIAL COLECTOMY N/A 04/08/2016   Procedure: LAPAROSCOPIC ASSISTED ASCENDING COLECTOMY POSSIBLE OPEN COLECTOMY;  Surgeon: Fanny Skates, MD;  Location: [place];  Service: General;  Laterality: N/A;  . orthopedic surgeries     multiple  . REDUCTION MAMMAPLASTY Bilateral   . SHOULDER OPEN ROTATOR CUFF REPAIR     rt and lft  . TEE WITHOUT CARDIOVERSION N/A 11/23/2017   Procedure: TRANSESOPHAGEAL ECHOCARDIOGRAM (TEE);  Surgeon: Jerline Pain, MD;  Location: [facility];  Service: Cardiovascular;  Laterality: N/A;  . TUBAL LIGATION     btsp     FAMILY HISTORY: Family History  Problem Relation Age of Onset  . Heart attack Father        30s  . Hypothyroidism Father   . Diabetes type II Father   . Kidney failure Father   . Diabetes type II Mother   . Hypertension Mother   . Breast cancer Mother 41  . Dementia Mother   . Hypothyroidism  Brother   . Diabetes Brother   . Breast cancer Maternal Aunt   . Breast cancer Cousin   . Breast cancer Maternal Aunt   . Breast cancer Cousin   . High blood pressure Other        "all"  . Heart attack Brother   . Lung cancer Brother      SOCIAL HISTORY: Social History   Socioeconomic History  . Marital status: Married    Spouse name: Not on file  . Number of children: 2  . Years of education: trade school  . Highest education level: Not on file  Occupational History  . Not on file  Tobacco Use  . Smoking status: Never Smoker  . Smokeless tobacco: Never Used  Vaping Use  .  Vaping Use: Never used  Substance and Sexual Activity  . Alcohol use: Never  . Drug use: Never  . Sexual activity: Yes    Birth control/protection: Post-menopausal, Surgical  Other Topics Concern  . Not on file  Social History Narrative   Lives at home with her husband   Right handed   Caffeine: 2-3 cups of coffee/day   Social Determinants of Health   Financial Resource Strain: Not on file  Food Insecurity: Not on file  Transportation Needs: Not on file  Physical Activity: Not on file  Stress: Not on file  Social Connections: Not on file  Intimate Partner Violence: Not on file      PHYSICAL EXAM  There were no vitals filed for this visit. There is no height or weight on file to calculate BMI.   Generalized: Well developed, in no acute distress  Cardiology: normal rate and rhythm, no murmur auscultated  Respiratory: clear to auscultation bilaterally    Neurological examination  Mentation: Alert oriented to time, place, history taking. Follows all commands speech and language fluent Cranial nerve II-XII: Pupils were equal round reactive to light. Extraocular movements were full, visual field were full on confrontational test. Facial sensation and strength were normal. Uvula tongue midline. Head turning and shoulder shrug  were normal and symmetric. Motor: The motor testing reveals 5 over 5 strength of all 4 extremities. Good symmetric motor tone is noted throughout.  Sensory: Sensory testing is intact to soft touch on all 4 extremities. No evidence of extinction is noted.  Coordination: Cerebellar testing reveals good finger-nose-finger and heel-to-shin bilaterally.  Gait and station: Gait is normal. Tandem gait is normal. Romberg is negative. No drift is seen.  Reflexes: Deep tendon reflexes are symmetric and normal bilaterally.     DIAGNOSTIC DATA (LABS, IMAGING, TESTING) - I reviewed patient records, labs, notes, testing and imaging myself where available.  Lab  Results  Component Value Date   WBC 7.9 12/02/2019   HGB 14.0 12/02/2019   HCT 42.2 12/02/2019   MCV 86.7 12/02/2019   PLT 302 12/02/2019      Component Value Date/Time   NA 133 (L) 02/18/2020 1119   K 4.7 02/18/2020 1119   CL 96 02/18/2020 1119   CO2 25 02/18/2020 1119   GLUCOSE 434 (H) 02/18/2020 1119   GLUCOSE 206 (H) 12/02/2019 1523   BUN 14 02/18/2020 1119   CREATININE 0.93 02/18/2020 1119   CALCIUM 9.4 02/18/2020 1119   PROT 7.0 12/02/2019 1523   PROT 6.5 07/10/2019 1442   ALBUMIN 3.8 12/02/2019 1523   ALBUMIN 4.1 07/10/2019 1442   AST 17 12/02/2019 1523   ALT 15 12/02/2019 1523   ALKPHOS 125 12/02/2019 1523  BILITOT 0.4 12/02/2019 1523   BILITOT 0.4 07/10/2019 1442   GFRNONAA 63 02/18/2020 1119   GFRAA 73 02/18/2020 1119   Lab Results  Component Value Date   CHOL 162 03/25/2020   HDL 48 03/25/2020   LDLCALC 94 03/25/2020   TRIG 109 03/25/2020   CHOLHDL 3.4 03/25/2020   Lab Results  Component Value Date   HGBA1C 10.1 (H) 07/10/2019   No results found for: [id number] Lab Results  Component Value Date   TSH 1.310 07/10/2019    No flowsheet data found.   ASSESSMENT AND PLAN  69 y.o. year old female  has a past medical history of Anemia, Arthritis, Blood transfusion, CAD (coronary artery disease), native coronary artery, Cervical spondylosis with myelopathy and radiculopathy, CVA (cerebral vascular accident) ([place]), Depression, Diabetes mellitus, Diastolic dysfunction, Diverticulitis, DJD (degenerative joint disease), DVT (deep venous thrombosis) ([place]), Endometriosis, Family history of adverse reaction to anesthesia, Fibromyalgia, GERD (gastroesophageal reflux disease), Hiatal hernia, History of kidney stones, Hyperlipidemia, Hypertension, Hypothyroidism, Insomnia, Mild aortic insufficiency, Osteoarthritis, Ovarian cyst, PONV (postoperative nausea and vomiting), RLS (restless legs syndrome), Thoracic compression fracture ([place]), Uterine fibroid, UTI (lower  urinary tract infection), Villous adenoma of right colon (04/08/2016), and Vitamin D deficiency disease. here with   No diagnosis found.    No orders of the defined types were placed in this encounter.     I spent 20 minutes of face-to-face and non-face-to-face time with patient.  This included previsit chart review, lab review, study review, order entry, electronic health record documentation, patient education.    Debbora Presto, MSN, FNP-C 07/16/2020, 7:36 AM  [facility] [address], [place], [place] [address] [phone]

## 2020-07-16 NOTE — Progress Notes (Addendum)
PATIENT: [name] DOB: 1950/12/30  REASON FOR VISIT: follow up HISTORY FROM: patient  HISTORY OF PRESENT ILLNESS: Today 07/16/20:  [name] is a 69 year old female with a history of migraine headaches.  She returns today for follow-up.  She is currently on imipramine 50 mg at bedtime.  She states that since she started this medication she has not had any headaches.  She states that has been working  well for her.  She denies any significant side effects.  She returns today for an evaluation.  HISTORY  04/14/20 [name] is a 69 y.o. female here today for follow up for migraines. She was unable to afford Ajovy or Amovig. We have not tried [employer]. She continues to have daily headaches. She feels a lot are triggered by chronic sinus infections. She admits that blood sugars are up and down. Stress definitely contributes. She has chronic pain. She is no longer on opioid therapy. She continues gabapentin and duloxetine.   She does snore. She feels that this is related to her having chronic sinus issues. She occasionally wakes with headache but feels headaches more frequently get worse throughout the day. She denies frequent waking. She denies excessive daytime sleepiness. She denies waking with dry mouth.   She continues regular follow up with PCP, pain management and endocrinology. She is on daily meal replacement insulin as well as daily basil insulin. CBGs "are up and down". Fasting reading this morning was 200. Last A1C 10. BP usually well managed. She continues metoprolol.    REVIEW OF SYSTEMS: Out of a complete 14 system review of symptoms, the patient complains only of the following symptoms, and all other reviewed systems are negative.   See HPI  ALLERGIES: Allergies  Allergen Reactions  . Dilaudid [Hydromorphone Hcl] Other (See Comments)    Hallucinations/ argumentative, goes beserk  . Heparin Other (See Comments)    HIT ab positive  (SRA negative 11/25/17)   .  Ambien [Zolpidem Tartrate] Other (See Comments)    Up sleep walking and eating  . Codeine Nausea And Vomiting  . Morphine And Related Other (See Comments)    Flushing and feeling hot Tolerates with Diphenhydramine  . Penicillins Rash and Other (See Comments)    Caused Headaches also Has patient had a PCN reaction causing immediate rash, facial/tongue/throat swelling, SOB or lightheadedness with hypotension: No Has patient had a PCN reaction causing severe rash involving mucus membranes or skin necrosis: No Has patient had a PCN reaction that required hospitalization No Has patient had a PCN reaction occurring within the last 10 years: No If all of the above answers are "NO", then may proceed with Cephalosporin use.    . Statins Other (See Comments)    Muscle weakness, cramps and aching all over body  . Suvorexant     ineffective Other reaction(s): Other ineffective  . Amitriptyline Other (See Comments)    Loopy feeling  . Ceftin [Cefuroxime Axetil] Other (See Comments)    Stomach pain   . Lovaza [Omega-3-Acid Ethyl Esters] Other (See Comments)    Stomach pain   . Lunesta [Eszopiclone] Other (See Comments)    Causes dizziness  . Lyrica [Pregabalin] Nausea Only  . Metformin And Related Nausea Only    HOME MEDICATIONS: Outpatient Medications Prior to Visit  Medication Sig Dispense Refill  . acetaminophen (TYLENOL) 500 MG tablet Take 1,000 mg by mouth every 8 (eight) hours as needed for mild pain or headache.    Marland Kitchen amLODipine (NORVASC)  2.5 MG tablet Take 5 mg by mouth daily.     Marland Kitchen aspirin EC 81 MG tablet Take 81 mg by mouth daily.    . Cholecalciferol (VITAMIN D3) 10 MCG (400 UNIT) CHEW Chew by mouth. OTC    . Cyanocobalamin (B-12) 2500 MCG TABS Take 2,500 mcg by mouth daily.    . cyclobenzaprine (FLEXERIL) 10 MG tablet Take 1 tablet (10 mg total) by mouth 3 (three) times daily as needed for muscle spasms. 30 tablet 0  . dexlansoprazole (DEXILANT) 60 MG capsule Take 60 mg by  mouth daily.    Marland Kitchen docusate sodium (COLACE) 100 MG capsule Take 1 capsule (100 mg total) by mouth 2 (two) times daily. 60 capsule 0  . DULoxetine (CYMBALTA) 60 MG capsule Take 60 mg by mouth daily.     . ferrous sulfate 325 (65 FE) MG tablet Take 1 tablet (325 mg total) by mouth 2 (two) times daily with a meal. (Patient taking differently: Take 325 mg by mouth daily with breakfast. ) 60 tablet 3  . fluticasone (FLONASE) 50 MCG/ACT nasal spray Place 2 sprays into both nostrils daily as needed for allergies or rhinitis.    . furosemide (LASIX) 20 MG tablet Take 20 mg by mouth daily as needed for fluid or edema.     . gabapentin (NEURONTIN) 300 MG capsule Take 600 mg by mouth at bedtime.   1  . Galcanezumab-gnlm (EMGALITY) 120 MG/ML SOAJ Inject 120 mg into the skin every 30 (thirty) days. 2 mL 0  . ibandronate (BONIVA) 150 MG tablet Take 150 mg by mouth every 30 (thirty) days.    Marland Kitchen ibuprofen (ADVIL) 200 MG tablet Take 200 mg by mouth 3 (three) times daily as needed for headache (pain).  (Patient not taking: Reported on 06/11/2020)    . imipramine (TOFRANIL) 25 MG tablet Take 1-2 tablets daily at bedtime 60 tablet 5  . insulin glargine (LANTUS) 100 UNIT/ML injection Inject 32 Units into the skin daily. Takes in am    . insulin lispro (HUMALOG KWIKPEN) 100 UNIT/ML KwikPen Inject 18 Units into the skin 3 (three) times daily.     . irbesartan (AVAPRO) 300 MG tablet Take 300 mg by mouth daily.     Marland Kitchen levothyroxine (SYNTHROID, LEVOTHROID) 150 MCG tablet Take 150 mcg by mouth daily before breakfast.  5  . LORazepam (ATIVAN) 0.5 MG tablet TAKE 1 TABLET AS NEEDED FOR ANXIETY EVERY 8 HRS    . Magnesium 100 MG CAPS Take by mouth. OTC    . meclizine (ANTIVERT) 12.5 MG tablet Take 25 mg by mouth 3 (three) times daily as needed for dizziness.    . metoprolol succinate (TOPROL XL) 25 MG 24 hr tablet Take 1 tablet (25 mg total) by mouth daily. 90 tablet 3  . ondansetron (ZOFRAN ODT) 4 MG disintegrating tablet Take 1  tablet (4 mg total) by mouth every 8 (eight) hours as needed for nausea or vomiting. 20 tablet 0  . pantoprazole (PROTONIX) 40 MG tablet Take 40 mg by mouth daily.    . Probiotic Product (PROBIOTIC PO) Take 1 capsule by mouth daily.    . promethazine (PHENERGAN) 25 MG tablet Take 25 mg by mouth every 8 (eight) hours as needed for nausea or vomiting.    . rosuvastatin (CRESTOR) 5 MG tablet TAKE 1 TABLET BY MOUTH EVERY DAY 90 tablet 3  . sucralfate (CARAFATE) 1 g tablet Take 1 g by mouth 4 (four) times daily.    . temazepam (RESTORIL)  15 MG capsule Take 15 mg by mouth at bedtime.   4   No facility-administered medications prior to visit.    PAST MEDICAL HISTORY: Past Medical History:  Diagnosis Date  . Anemia    iron deficiency  . Arthritis   . Blood transfusion   . CAD (coronary artery disease), native coronary artery    coronary CTA showed a coronary Ca score of 506, mild CAD with 25-49% mRCA and moderate CAD with 50-69% sequential mRCA, 25-49% inferior sub branch of D1 and 25-49% in pLCx.    . Cervical spondylosis with myelopathy and radiculopathy   . CVA (cerebral vascular accident) ([place])    11/2017  . Depression   . Diabetes mellitus    Type II  . Diastolic dysfunction   . Diverticulitis   . DJD (degenerative joint disease)   . DVT (deep venous thrombosis) ([facility])    times 2 lower leg  . Endometriosis   . Family history of adverse reaction to anesthesia    mom and dad PONV  . Fibromyalgia   . GERD (gastroesophageal reflux disease)    occ  . Hiatal hernia   . History of kidney stones   . Hyperlipidemia   . Hypertension   . Hypothyroidism   . Insomnia   . Mild aortic insufficiency    by echo 04/2013  . Osteoarthritis    back and knee  . Ovarian cyst   . PONV (postoperative nausea and vomiting)   . RLS (restless legs syndrome)   . Thoracic compression fracture ([place])   . Uterine fibroid   . UTI (lower urinary tract infection)   . Villous adenoma of right colon 04/08/2016   . Vitamin D deficiency disease     PAST SURGICAL HISTORY: Past Surgical History:  Procedure Laterality Date  . ANTERIOR CERVICAL DECOMP/DISCECTOMY FUSION N/A 03/23/2017   Procedure: ANTERIOR CERVICAL DECOMPRESSION/DISCECTOMY FUSION, INTERBODY PROSTHESIS,PLATE CERVICAL THREE- CERVICAL FOUR, CERVICAL FOUR- CERVICAL FIVE, CERVICAL FIVE- CERVICAL SIX;  Surgeon: Newman Pies, MD;  Location: [place];  Service: Neurosurgery;  Laterality: N/A;  . ANTERIOR CERVICAL DECOMP/DISCECTOMY FUSION N/A 04/18/2019   Procedure: ANTERIOR CERVICAL DECOMPRESSION/DISCECTOMY  CERVICAL SIX- CERVICAL SEVEN;  Surgeon: Newman Pies, MD;  Location: [place];  Service: Neurosurgery;  Laterality: N/A;  anterior  . APPENDECTOMY    . BACK SURGERY  ,2005   April  2013 - [facility] fusion[facility]  . bowel blockage surgery    . BREAST SURGERY     breast reduction  . CARDIAC CATHETERIZATION  04/2013   normal coronary arteries and normal LVF  . CARPAL TUNNEL RELEASE  06/05/2012   Procedure: CARPAL TUNNEL RELEASE;  Surgeon: Cammie Sickle., MD;  Location: [place];  Service: Orthopedics;  Laterality: Left;  . CESAREAN SECTION     x 2  . CHOLECYSTECTOMY    . COLECTOMY  04/08/2016  . DILATION AND CURETTAGE OF UTERUS    . DORSAL COMPARTMENT RELEASE  06/05/2012   Procedure: RELEASE DORSAL COMPARTMENT (DEQUERVAIN);  Surgeon: Cammie Sickle., MD;  Location: [facility];  Service: Orthopedics;  Laterality: Left;  Excision of mixoid cyst also  . EYE SURGERY     cataracts bilateral  . JOINT REPLACEMENT     Bilateral knee  . KNEE ARTHROPLASTY  09   lft partial  . KNEE ARTHROPLASTY     rt  . LAPAROSCOPIC PARTIAL COLECTOMY N/A 04/08/2016   Procedure: LAPAROSCOPIC ASSISTED ASCENDING COLECTOMY POSSIBLE OPEN COLECTOMY;  Surgeon: Fanny Skates,  MD;  Location: [place];  Service: General;  Laterality: N/A;  . orthopedic surgeries     multiple  . REDUCTION MAMMAPLASTY Bilateral   . SHOULDER OPEN ROTATOR  CUFF REPAIR     rt and lft  . TEE WITHOUT CARDIOVERSION N/A 11/23/2017   Procedure: TRANSESOPHAGEAL ECHOCARDIOGRAM (TEE);  Surgeon: Jerline Pain, MD;  Location: [facility] ENDOSCOPY;  Service: Cardiovascular;  Laterality: N/A;  . TUBAL LIGATION     btsp    FAMILY HISTORY: Family History  Problem Relation Age of Onset  . Heart attack Father        66s  . Hypothyroidism Father   . Diabetes type II Father   . Kidney failure Father   . Diabetes type II Mother   . Hypertension Mother   . Breast cancer Mother 30  . Dementia Mother   . Hypothyroidism Brother   . Diabetes Brother   . Breast cancer Maternal Aunt   . Breast cancer Cousin   . Breast cancer Maternal Aunt   . Breast cancer Cousin   . High blood pressure Other        "all"  . Heart attack Brother   . Lung cancer Brother     SOCIAL HISTORY: Social History   Socioeconomic History  . Marital status: Married    Spouse name: Not on file  . Number of children: 2  . Years of education: trade school  . Highest education level: Not on file  Occupational History  . Not on file  Tobacco Use  . Smoking status: Never Smoker  . Smokeless tobacco: Never Used  Vaping Use  . Vaping Use: Never used  Substance and Sexual Activity  . Alcohol use: Never  . Drug use: Never  . Sexual activity: Yes    Birth control/protection: Post-menopausal, Surgical  Other Topics Concern  . Not on file  Social History Narrative   Lives at home with her husband   Right handed   Caffeine: 2-3 cups of coffee/day   Social Determinants of Health   Financial Resource Strain: Not on file  Food Insecurity: Not on file  Transportation Needs: Not on file  Physical Activity: Not on file  Stress: Not on file  Social Connections: Not on file  Intimate Partner Violence: Not on file      PHYSICAL EXAM  Vitals:   07/16/20 1135  BP: (!) 161/69  Pulse: 98  Weight: 220 lb (99.8 kg)  Height: 5\' 1"  (1.549 m)   Body mass index is 41.57  kg/m.  Generalized: Well developed, in no acute distress   Neurological examination  Mentation: Alert oriented to time, place, history taking. Follows all commands speech and language fluent Cranial nerve II-XII: Pupils were equal round reactive to light. Extraocular movements were full, visual field were full on confrontational test.  Head turning and shoulder shrug  were normal and symmetric. Motor: The motor testing reveals 5 over 5 strength of all 4 extremities. Good symmetric motor tone is noted throughout.  Sensory: Sensory testing is intact to soft touch on all 4 extremities. No evidence of extinction is noted.  Coordination: Cerebellar testing reveals good finger-nose-finger and heel-to-shin bilaterally.  Gait and station: Gait is normal.  Reflexes: Deep tendon reflexes are symmetric and normal bilaterally.   DIAGNOSTIC DATA (LABS, IMAGING, TESTING) - I reviewed patient records, labs, notes, testing and imaging myself where available.  Lab Results  Component Value Date   WBC 7.9 12/02/2019   HGB 14.0 12/02/2019  HCT 42.2 12/02/2019   MCV 86.7 12/02/2019   PLT 302 12/02/2019      Component Value Date/Time   NA 133 (L) 02/18/2020 1119   K 4.7 02/18/2020 1119   CL 96 02/18/2020 1119   CO2 25 02/18/2020 1119   GLUCOSE 434 (H) 02/18/2020 1119   GLUCOSE 206 (H) 12/02/2019 1523   BUN 14 02/18/2020 1119   CREATININE 0.93 02/18/2020 1119   CALCIUM 9.4 02/18/2020 1119   PROT 7.0 12/02/2019 1523   PROT 6.5 07/10/2019 1442   ALBUMIN 3.8 12/02/2019 1523   ALBUMIN 4.1 07/10/2019 1442   AST 17 12/02/2019 1523   ALT 15 12/02/2019 1523   ALKPHOS 125 12/02/2019 1523   BILITOT 0.4 12/02/2019 1523   BILITOT 0.4 07/10/2019 1442   GFRNONAA 63 02/18/2020 1119   GFRAA 73 02/18/2020 1119   Lab Results  Component Value Date   CHOL 162 03/25/2020   HDL 48 03/25/2020   LDLCALC 94 03/25/2020   TRIG 109 03/25/2020   CHOLHDL 3.4 03/25/2020   Lab Results  Component Value Date    HGBA1C 10.1 (H) 07/10/2019   No results found for: VITAMINB12 Lab Results  Component Value Date   TSH 1.310 07/10/2019      ASSESSMENT AND PLAN 69 y.o. year old female  has a past medical history of Anemia, Arthritis, Blood transfusion, CAD (coronary artery disease), native coronary artery, Cervical spondylosis with myelopathy and radiculopathy, CVA (cerebral vascular accident) ([employer]), Depression, Diabetes mellitus, Diastolic dysfunction, Diverticulitis, DJD (degenerative joint disease), DVT (deep venous thrombosis) ([place]), Endometriosis, Family history of adverse reaction to anesthesia, Fibromyalgia, GERD (gastroesophageal reflux disease), Hiatal hernia, History of kidney stones, Hyperlipidemia, Hypertension, Hypothyroidism, Insomnia, Mild aortic insufficiency, Osteoarthritis, Ovarian cyst, PONV (postoperative nausea and vomiting), RLS (restless legs syndrome), Thoracic compression fracture ([place]), Uterine fibroid, UTI (lower urinary tract infection), Villous adenoma of right colon (04/08/2016), and Vitamin D deficiency disease. here with :  1.  Migraine headaches  --Continue imipramine 50 mg at bedtime --Advised that if her migraine headaches worsen or she develops new symptoms she should let us know --Follow-up in 6 months with Amy or sooner if needed   I spent 25 minutes of face-to-face and non-face-to-face time with patient.  This included previsit chart review, lab review, study review, order entry, electronic health record documentation, patient education.  Ward Givens, MSN, NP-C 07/16/2020, 11:39 AM [facility] [address] [place], [place] [address] [phone]  Made any corrections needed, and agree with history, physical, neuro exam,assessment and plan as stated.     Sarina Ill, MD [facility]

## 2020-07-23 DIAGNOSIS — Z96652 Presence of left artificial knee joint: Secondary | ICD-10-CM | POA: Diagnosis not present

## 2020-07-23 DIAGNOSIS — M1612 Unilateral primary osteoarthritis, left hip: Secondary | ICD-10-CM | POA: Diagnosis not present

## 2020-07-24 ENCOUNTER — Ambulatory Visit: Payer: Medicare Other | Admitting: Physical Therapy

## 2020-08-10 ENCOUNTER — Ambulatory Visit: Payer: Medicare Other

## 2020-10-07 ENCOUNTER — Other Ambulatory Visit: Payer: Self-pay

## 2020-10-07 ENCOUNTER — Ambulatory Visit: Payer: Medicare Other | Attending: Student

## 2020-10-07 DIAGNOSIS — G8929 Other chronic pain: Secondary | ICD-10-CM | POA: Diagnosis present

## 2020-10-07 DIAGNOSIS — M5442 Lumbago with sciatica, left side: Secondary | ICD-10-CM | POA: Insufficient documentation

## 2020-10-07 DIAGNOSIS — R262 Difficulty in walking, not elsewhere classified: Secondary | ICD-10-CM | POA: Insufficient documentation

## 2020-10-07 DIAGNOSIS — M5441 Lumbago with sciatica, right side: Secondary | ICD-10-CM | POA: Diagnosis present

## 2020-10-07 NOTE — Therapy (Signed)
[place], [place], [address] Phone: [phone]   Fax:  [phone]  Physical Therapy Evaluation  Patient Details  Name: [name] MRN: [id number] Date of Birth: 01-Mar-1951 Referring Provider (PT): Viona Gilmore D, NP   Encounter Date: 10/07/2020   PT End of Session - 10/07/20 1009    Visit Number 1    Number of Visits 9    Date for PT Re-Evaluation 12/05/20    Authorization Type [employer] [place] visit 6 and visit 10; PN visit 10; KX modifier visit 15    PT Start Time 1010   pt arrived late   PT Stop Time 1055    PT Time Calculation (min) 45 min    Activity Tolerance Patient tolerated treatment well    Behavior During Therapy [facility] for tasks assessed/performed           Past Medical History:  Diagnosis Date  . Anemia    iron deficiency  . Arthritis   . Blood transfusion   . CAD (coronary artery disease), native coronary artery    coronary CTA showed a coronary Ca score of 506, mild CAD with 25-49% mRCA and moderate CAD with 50-69% sequential mRCA, 25-49% inferior sub branch of D1 and 25-49% in pLCx.    . Cervical spondylosis with myelopathy and radiculopathy   . CVA (cerebral vascular accident) ([place])    11/2017  . Depression   . Diabetes mellitus    Type II  . Diastolic dysfunction   . Diverticulitis   . DJD (degenerative joint disease)   . DVT (deep venous thrombosis) ([facility])    times 2 lower leg  . Endometriosis   . Family history of adverse reaction to anesthesia    mom and dad PONV  . Fibromyalgia   . GERD (gastroesophageal reflux disease)    occ  . Hiatal hernia   . History of kidney stones   . Hyperlipidemia   . Hypertension   . Hypothyroidism   . Insomnia   . Mild aortic insufficiency    by echo 04/2013  . Osteoarthritis    back and knee  . Ovarian cyst   . PONV (postoperative nausea and vomiting)   . RLS (restless legs syndrome)   . Thoracic compression fracture ([place])    . Uterine fibroid   . UTI (lower urinary tract infection)   . Villous adenoma of right colon 04/08/2016  . Vitamin D deficiency disease     Past Surgical History:  Procedure Laterality Date  . ANTERIOR CERVICAL DECOMP/DISCECTOMY FUSION N/A 03/23/2017   Procedure: ANTERIOR CERVICAL DECOMPRESSION/DISCECTOMY FUSION, INTERBODY PROSTHESIS,PLATE CERVICAL THREE- CERVICAL FOUR, CERVICAL FOUR- CERVICAL FIVE, CERVICAL FIVE- CERVICAL SIX;  Surgeon: Newman Pies, MD;  Location: [facility];  Service: Neurosurgery;  Laterality: N/A;  . ANTERIOR CERVICAL DECOMP/DISCECTOMY FUSION N/A 04/18/2019   Procedure: ANTERIOR CERVICAL DECOMPRESSION/DISCECTOMY  CERVICAL SIX- CERVICAL SEVEN;  Surgeon: Newman Pies, MD;  Location: [facility];  Service: Neurosurgery;  Laterality: N/A;  anterior  . APPENDECTOMY    . BACK SURGERY  ,2005   April  2013 - spinal fusion@ [facility]  . bowel blockage surgery    . BREAST SURGERY     breast reduction  . CARDIAC CATHETERIZATION  04/2013   normal coronary arteries and normal LVF  . CARPAL TUNNEL RELEASE  06/05/2012   Procedure: CARPAL TUNNEL RELEASE;  Surgeon: Cammie Sickle., MD;  Location: [place];  Service: Orthopedics;  Laterality: Left;  .  CESAREAN SECTION     x 2  . CHOLECYSTECTOMY    . COLECTOMY  04/08/2016  . DILATION AND CURETTAGE OF UTERUS    . DORSAL COMPARTMENT RELEASE  06/05/2012   Procedure: RELEASE DORSAL COMPARTMENT (DEQUERVAIN);  Surgeon: Cammie Sickle., MD;  Location: [facility];  Service: Orthopedics;  Laterality: Left;  Excision of mixoid cyst also  . EYE SURGERY     cataracts bilateral  . JOINT REPLACEMENT     Bilateral knee  . KNEE ARTHROPLASTY  09   lft partial  . KNEE ARTHROPLASTY     rt  . LAPAROSCOPIC PARTIAL COLECTOMY N/A 04/08/2016   Procedure: LAPAROSCOPIC ASSISTED ASCENDING COLECTOMY POSSIBLE OPEN COLECTOMY;  Surgeon: Fanny Skates, MD;  Location: [place];  Service: General;  Laterality: N/A;  . orthopedic  surgeries     multiple  . REDUCTION MAMMAPLASTY Bilateral   . SHOULDER OPEN ROTATOR CUFF REPAIR     rt and lft  . TEE WITHOUT CARDIOVERSION N/A 11/23/2017   Procedure: TRANSESOPHAGEAL ECHOCARDIOGRAM (TEE);  Surgeon: Jerline Pain, MD;  [facility]: [facility];  Service: Cardiovascular;  Laterality: N/A;  . TUBAL LIGATION     btsp    There were no vitals filed for this visit.    Subjective Assessment - 10/07/20 1014    Subjective "I've had 3 surgeries on my lower back, 2 surgeries in my neck. I have this pain in my back that goes around to my sides down to each leg. If I have to sweep the floor, I can't finish it. I have to stop and go lie down because of the pain. Doing anything like that is so frustrating because of the pain. The doctor did an MRI and said there was nothing they can do. I have had shots with the most recent one over a year ago, but they put it in my upper back because I have issues between my shoulder blades. I was supposed to start PT a long time ago, but I have been sick ever since. I had surgery Thursday 09/24/2020 because I had a twisted intestine with a blockage, a perforation, and I had sepsis. I still have staples in, so I'm not supposed to do anything. I'm hoping to go back tomorrow. They told me to go ahead and come to PT for the evaluation but not do anything yet."    Pertinent History See PMH above. small bowel resection with lysis of adhesions 09/24/2020    Limitations Sitting;Standing;Walking;House hold activities;Lifting    How long can you sit comfortably? uncomfortable - leans to R side and offloads LLE in sitting    How long can you stand comfortably? "might hit but don't notice I guess because I have my mind on something"    How long can you walk comfortably? "probably about 10 minutes maybe. If I'm walking slow at the grocery store, I am pushing a shopping cart and do okay. But I really feel it when I go for my walking outside by my house."    Diagnostic tests  06/15/2020 CT Lumbar: 1. Solid L2-S1 fusion without stenosis.  2. Mild disc bulging at L1-2 and small left paracentral disc  protrusion T12-L1 without stenosis.  3. Aortic Atherosclerosis    Patient Stated Goals "I really want to walk without having to turn and tell my grandchild I have to go back inside. I want to work in the yard because I haven't been able to."    Currently in Pain? Yes  Pain Score 2     Pain Location Hip    Pain Orientation Left;Posterior;Lateral    Pain Descriptors / Indicators Aching    Pain Type Chronic pain    Pain Radiating Towards reports random radicular pain that occurs in BLE (L > R)    Pain Onset More than a month ago    Pain Frequency Intermittent    Aggravating Factors  household activities (sweeping, mopping, vacuuming), yardwork, prolonged walking, moving in a hurry    Pain Relieving Factors lie down with heat pack, sit in a comfortable upright position    Effect of Pain on Daily Activities Difficulty with walking and household activities              [facility] PT Assessment - 10/07/20 0001      Assessment   Medical Diagnosis Lumbago with sciatica, left side (M54.42)    Referring Provider (PT) Patricia Nettle, NP    Onset Date/Surgical Date --   several years   Hand Dominance Right    Next MD Visit May 2022    Prior Therapy Yes but not sure when for back. 6-7 years ago for R knee      Precautions   Precaution Comments no lifting > gallon of milk. currently has staples in and an open wound per patient. she states she will find out more on Friday after her follow up      Restrictions   Weight Bearing Restrictions No      Balance Screen   Has the patient fallen in the past 6 months Yes    How many times? 2    Has the patient had a decrease in activity level because of a fear of falling?  Yes    Is the patient reluctant to leave their home because of a fear of falling?  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, walking      Cognition   Overall Cognitive Status Within Functional Limits for tasks assessed      Observation/Other Assessments   Focus on Therapeutic Outcomes (FOTO)  47% function; predicted 50% function      ROM / Strength   AROM / PROM / Strength AROM;Strength      AROM   Overall AROM Comments BLE AROM WFL grossly      Strength   Strength Assessment Site Hip;Knee;Ankle    Right/Left Hip Right;Left    Right Hip Flexion 5/5    Right Hip ABduction 4+/5    Right Hip ADduction 5/5    Left Hip Flexion 5/5    Left Hip ABduction 4+/5    Left Hip ADduction 5/5    Right/Left Knee Right;Left    Right Knee Flexion 5/5    Right Knee Extension 5/5    Left Knee Flexion 5/5    Left Knee Extension 5/5    Right/Left Ankle Right;Left  Right Ankle Dorsiflexion 5/5    Right Ankle Plantar Flexion 5/5   modified test in sitting   Left Ankle Dorsiflexion 5/5    Left Ankle Plantar Flexion 5/5   modified test in sitting     Palpation   Palpation comment TTP along L greater trochanter, trochanteric bursa, and IT band      Transfers   Five time sit to stand comments  19.19 seconds      High Level Balance   High Level Balance Comments Patient able to perform SLS for 2-3 seconds before using stepping strategy each LE. Able to perform tandem stance with RLE posterior > 10 seconds but stepping strategy when LLE posterior ~3-4 seconds. Standing with narrow BOS EO and EC > 10 seconds without LOB                      Objective  measurements completed on examination: See above findings.               PT Education - 10/07/20 1235    Education Details Objective findings, anatomy of condition, FOTO and potential progress with PT, POC. Reviewed patient's current lifting precautions and to avoid valsalva maneuver. Provided tennis ball and cardboard roller for self STM and MFR along L thigh and IT band    Person(s) Educated Patient    Methods Explanation;Demonstration;Tactile cues;Verbal cues    Comprehension Verbalized understanding;Returned demonstration;Need further instruction            PT Short Term Goals - 10/07/20 1237      PT SHORT TERM GOAL #1   Title Patient will be independent with initial HEP.    Baseline Initial HEP to be issued next session    Time 4    Period Weeks    Status New    Target Date 11/04/20      PT SHORT TERM GOAL #2   Title Patient will be able to sit in a chair for at least 15 minutes without onset of numbness in LLE    Baseline Pt reports numbness in LLE (lateral thigh mostly) within 10 minutes of sitting in chair during eval    Time 4    Period Weeks    Status New    Target Date 11/04/20      PT SHORT TERM GOAL #3   Title Patient will improve 5xSTS from 19.19 seconds without UE assist to </= 17 seconds from chair.    Baseline 19.19 seconds without UE assist from chair    Time 4    Period Weeks    Status New    Target Date 11/04/20      PT SHORT TERM GOAL #4   Title Patient will demonstrate optimal body mechanics and technique for bending and turning to allow for improved tolerance with sweeping, mopping, vacuuming, etc.    Baseline Unable to assess at eval but patient reports having symptom aggravation with these household tasks    Time 4    Period Weeks    Status New    Target Date 11/04/20             PT Long Term Goals - 10/07/20 1242      PT LONG TERM GOAL #1   Title Patient will be independent with advanced HEP.    Baseline Initial HEP to be  issued next session    Time 8    Period Weeks    Status New    Target Date  12/02/20      PT LONG TERM GOAL #2   Title Patient will be able to sit in a chair for at least 30 minutes without onset of numbness in LLE    Baseline Pt reports numbness in LLE (lateral thigh mostly) within 10 minutes of sitting in chair during eval    Time 8    Period Weeks    Status New    Target Date 12/02/20      PT LONG TERM GOAL #3   Title Patient will report being able to ambulate outside of her house for at least 20 minutes with </= 3/10 L hip or low back pain.    Baseline 2/10 pain at rest with increased pain within 10 minutes of ambulation outdoor at desired pace    Time 8    Period Weeks    Status New    Target Date 12/02/20      PT LONG TERM GOAL #4   Title Patient's FOTO score will increase from 47% to 50% function to demonstrate improved perceived ability.    Baseline 47% function; predicted 50% function    Time 8    Period Weeks    Status New    Target Date 12/02/20      PT LONG TERM GOAL #5   Title Patient will improve 5xSTS from 19.19 seconds without UE assist to </= 15 seconds from chair.    Baseline 19.19 seconds    Time 8    Period Weeks    Status New    Target Date 12/02/20                  Plan - 10/07/20 1010    Clinical Impression Statement Patient is a 70 year old female who presents to [facility] with complaints of chronic LBP with bilateral radicular symptoms (L > R). She underwent small bowel resection with lysis of adhesions 09/24/2020 and presents to PT prior to removal of staples, with an open wound, and reports being advised to attend evaluation but to not begin with interventions this session. She states she has an appointment Friday 10/09/2020 for follow up and potential removal of staples. She is able to perform AROM of BLE WFL and 5/5 BLE strength grossly upon MMT assessment. Pt presents with numbness along LLE with diminished sensation along entire lateral LLE upon  light touch assessment with greatest numbness reported along L5 dermatomal region. She should benefit from skilled PT intervention to address deficits and allow for iimproved tolerance with walking, household activities, and yardwork.    Personal Factors and Comorbidities Age;Comorbidity 3+;Past/Current Experience;Fitness;Time since onset of injury/illness/exacerbation    Comorbidities See extensive PMH above    Examination-Activity Limitations Bend;Carry;Lift;Locomotion Level;Sit;Sleep;Squat;Stairs;Stand    Examination-Participation Restrictions Cleaning;Community Activity;Laundry;Occupation;Yard Work;Meal Prep;Shop    Stability/Clinical Decision Making Evolving/Moderate complexity    Clinical Decision Making Moderate    Rehab Potential Good    PT Frequency 1x / week    PT Duration 8 weeks    PT Treatment/Interventions ADLs/Self Care Home Management;Aquatic Therapy;Electrical Stimulation;Iontophoresis 4mg /ml Dexamethasone;Moist Heat;Traction;Neuromuscular re-education;Balance training;Therapeutic exercise;Therapeutic activities;Functional mobility training;Stair training;Gait training;Patient/family education;Manual techniques;Energy conservation;Dry needling;Passive range of motion;Taping    PT Next Visit Plan Issue initial HEP next session. Further assess lumbar AROM and hip PROM if able. Manual techniques PRN. Initiate core strengthening as able. L IT band/TFL stretch and IASTM    PT Home Exercise Plan Issue HEP next session    Consulted and Agree with Plan of Care Patient  Patient will benefit from skilled therapeutic intervention in order to improve the following deficits and impairments:  Decreased activity tolerance,Decreased balance,Decreased cognition,Decreased mobility,Decreased strength,Impaired sensation,Improper body mechanics,Postural dysfunction,Decreased endurance,Decreased range of motion,Decreased scar mobility,Difficulty walking,Increased muscle spasms,Pain  Visit  Diagnosis: Chronic bilateral low back pain with bilateral sciatica  Difficulty in walking, not elsewhere classified     Problem List Patient Active Problem List   Diagnosis Date Noted  . CAD (coronary artery disease), native coronary artery   . Diabetic hyperosmolar non-ketotic state ([place]) 05/15/2018  . Hyperlipidemia 05/15/2018  . GERD (gastroesophageal reflux disease) 05/15/2018  . Depression with anxiety 05/15/2018  . Cerebral thrombosis with cerebral infarction 11/22/2017  . Acute encephalopathy   . DKA (diabetic ketoacidoses) 11/18/2017  . Diabetic ketoacidosis ([place]) 11/18/2017  . Acute respiratory failure with hypoxia ([place])   . Acute renal failure ([place])   . Endotracheal tube present   . Septic shock ([place])   . Spondylolisthesis of lumbar region 11/15/2017  . PAC (premature atrial contraction) 09/04/2017  . Cervical spondylosis with radiculopathy 03/23/2017  . Hypoxia   . Villous adenoma of right colon 04/08/2016  . Leukocytosis 04/08/2016  . Anemia   . Diastolic dysfunction   . Mild aortic insufficiency   . Other nonspecific abnormal cardiovascular system function study 05/03/2013  . SOB (shortness of breath) 05/01/2013  . Atypical chest pain 02/28/2013  . Dysphagia 02/28/2013  . UTI (lower urinary tract infection) 07/06/2012  . Intractable nausea and vomiting 07/06/2012  . Blood loss anemia 07/06/2012  . Epigastric abdominal pain 06/27/2012  . Type II diabetes mellitus ([place]) 07/28/2011  . Hypothyroidism 07/28/2011  . Hypertension 07/28/2011  . Hepatic steatosis 07/28/2011       Haydee Monica, PT, DPT 10/07/20 12:52 PM  [facility] [address] [place], [place], [address] Phone: [phone]   Fax:  [phone]  Name: JACQULINE TERRILL MRN: [id number] Date of Birth: June 08, 1951

## 2020-10-22 ENCOUNTER — Ambulatory Visit: Payer: Medicare Other | Admitting: Physical Therapy

## 2020-10-22 ENCOUNTER — Encounter: Payer: Self-pay | Admitting: Physical Therapy

## 2020-10-22 ENCOUNTER — Other Ambulatory Visit: Payer: Self-pay

## 2020-10-22 DIAGNOSIS — M5442 Lumbago with sciatica, left side: Secondary | ICD-10-CM | POA: Diagnosis not present

## 2020-10-22 DIAGNOSIS — G8929 Other chronic pain: Secondary | ICD-10-CM

## 2020-10-22 DIAGNOSIS — M5441 Lumbago with sciatica, right side: Secondary | ICD-10-CM

## 2020-10-22 DIAGNOSIS — R262 Difficulty in walking, not elsewhere classified: Secondary | ICD-10-CM

## 2020-10-22 NOTE — Therapy (Signed)
[place], [place], [address] Phone: [phone]   Fax:  [phone]  Physical Therapy Treatment  Patient Details  Name: [name] MRN: [id number] Date of Birth: [birth year] Referring Provider (PT): Viona Gilmore D, NP   Encounter Date: 10/22/2020   PT End of Session - 10/22/20 1155    Visit Number 2    Number of Visits 9    Date for PT Re-Evaluation 12/05/20    Authorization Type [employer] [place] visit 6 and visit 10; PN visit 10; KX modifier visit 15    PT Start Time 1150    PT Stop Time 1240    PT Time Calculation (min) 50 min           Past Medical History:  Diagnosis Date  . Anemia    iron deficiency  . Arthritis   . Blood transfusion   . CAD (coronary artery disease), native coronary artery    coronary CTA showed a coronary Ca score of 506, mild CAD with 25-49% mRCA and moderate CAD with 50-69% sequential mRCA, 25-49% inferior sub branch of D1 and 25-49% in pLCx.    . Cervical spondylosis with myelopathy and radiculopathy   . CVA (cerebral vascular accident) ([place])    11/2017  . Depression   . Diabetes mellitus    Type II  . Diastolic dysfunction   . Diverticulitis   . DJD (degenerative joint disease)   . DVT (deep venous thrombosis) ([facility])    times 2 lower leg  . Endometriosis   . Family history of adverse reaction to anesthesia    mom and dad PONV  . Fibromyalgia   . GERD (gastroesophageal reflux disease)    occ  . Hiatal hernia   . History of kidney stones   . Hyperlipidemia   . Hypertension   . Hypothyroidism   . Insomnia   . Mild aortic insufficiency    by echo 04/2013  . Osteoarthritis    back and knee  . Ovarian cyst   . PONV (postoperative nausea and vomiting)   . RLS (restless legs syndrome)   . Thoracic compression fracture ([place])   . Uterine fibroid   . UTI (lower urinary tract infection)   . Villous adenoma of right colon 04/08/2016  . Vitamin D deficiency  disease     Past Surgical History:  Procedure Laterality Date  . ANTERIOR CERVICAL DECOMP/DISCECTOMY FUSION N/A 03/23/2017   Procedure: ANTERIOR CERVICAL DECOMPRESSION/DISCECTOMY FUSION, INTERBODY PROSTHESIS,PLATE CERVICAL THREE- CERVICAL FOUR, CERVICAL FOUR- CERVICAL FIVE, CERVICAL FIVE- CERVICAL SIX;  Surgeon: Newman Pies, MD;  Location: [facility];  Service: Neurosurgery;  Laterality: N/A;  . ANTERIOR CERVICAL DECOMP/DISCECTOMY FUSION N/A 04/18/2019   Procedure: ANTERIOR CERVICAL DECOMPRESSION/DISCECTOMY  CERVICAL SIX- CERVICAL SEVEN;  Surgeon: Newman Pies, MD;  Location: [facility];  Service: Neurosurgery;  Laterality: N/A;  anterior  . APPENDECTOMY    . BACK SURGERY  ,2005   April  2013 - spinal fusion@ [facility]  . bowel blockage surgery    . BREAST SURGERY     breast reduction  . CARDIAC CATHETERIZATION  04/2013   normal coronary arteries and normal LVF  . CARPAL TUNNEL RELEASE  06/05/2012   Procedure: CARPAL TUNNEL RELEASE;  Surgeon: Cammie Sickle., MD;  Location: [facility];  Service: Orthopedics;  Laterality: Left;  . CESAREAN SECTION     x 2  . CHOLECYSTECTOMY    . COLECTOMY  04/08/2016  . DILATION AND  CURETTAGE OF UTERUS    . DORSAL COMPARTMENT RELEASE  06/05/2012   Procedure: RELEASE DORSAL COMPARTMENT (DEQUERVAIN);  Surgeon: Cammie Sickle., MD;  Location: [facility];  Service: Orthopedics;  Laterality: Left;  Excision of mixoid cyst also  . EYE SURGERY     cataracts bilateral  . JOINT REPLACEMENT     Bilateral knee  . KNEE ARTHROPLASTY  09   lft partial  . KNEE ARTHROPLASTY     rt  . LAPAROSCOPIC PARTIAL COLECTOMY N/A 04/08/2016   Procedure: LAPAROSCOPIC ASSISTED ASCENDING COLECTOMY POSSIBLE OPEN COLECTOMY;  Surgeon: Fanny Skates, MD;  Location: [facility];  Service: General;  Laterality: N/A;  . orthopedic surgeries     multiple  . REDUCTION MAMMAPLASTY Bilateral   . SHOULDER OPEN ROTATOR CUFF REPAIR     rt and lft  . TEE WITHOUT  CARDIOVERSION N/A 11/23/2017   Procedure: TRANSESOPHAGEAL ECHOCARDIOGRAM (TEE);  Surgeon: Jerline Pain, MD;  Location: [facility];  Service: Cardiovascular;  Laterality: N/A;  . TUBAL LIGATION     btsp    There were no vitals filed for this visit.   Subjective Assessment - 10/22/20 1153    Subjective I had my staples removed but I still have an open wound in my abdomen. Sometimes  I feel unstable on my feet, feel like I am losing balance backward. I am having pain in back of legs.    Pertinent History See PMH above. small bowel resection with lysis of adhesions 09/24/2020    Currently in Pain? Yes    Pain Score 4     Pain Location Hip    Pain Orientation Left;Lateral    Pain Descriptors / Indicators Stabbing    Pain Type Chronic pain              [facility] PT Assessment - 10/22/20 0001      AROM   AROM Assessment Site Hip    Right/Left Hip Right;Left    Right Hip Flexion 104   active   Right Hip External Rotation  45   passive   Right Hip Internal Rotation  16   passive   Left Hip Flexion 100   active   Left Hip External Rotation  50   passive   Left Hip Internal Rotation  20   passive     Flexibility   Soft Tissue Assessment /Muscle Length yes    Hamstrings R 55, L 63                         [facility] Adult PT Treatment/Exercise - 10/22/20 0001      Exercises   Exercises Knee/Hip      Knee/Hip Exercises: Stretches   Active Hamstring Stretch Right;Left    Active Hamstring Stretch Limitations Seated with strap -heel on floor    Other Knee/Hip Stretches Figure 4  push and pull      Modalities   Modalities Moist Heat      Moist Heat Therapy   Number Minutes Moist Heat 10 Minutes    Moist Heat Location Hip      Manual Therapy   Manual Therapy Soft tissue mobilization    Soft tissue mobilization Left lateral hip/left gluteal, ITB with massage roller                    PT Short Term Goals - 10/07/20 1237      PT SHORT TERM GOAL #1  Title  Patient will be independent with initial HEP.    Baseline Initial HEP to be issued next session    Time 4    Period Weeks    Status New    Target Date 11/04/20      PT SHORT TERM GOAL #2   Title Patient will be able to sit in a chair for at least 15 minutes without onset of numbness in LLE    Baseline Pt reports numbness in LLE (lateral thigh mostly) within 10 minutes of sitting in chair during eval    Time 4    Period Weeks    Status New    Target Date 11/04/20      PT SHORT TERM GOAL #3   Title Patient will improve 5xSTS from 19.19 seconds without UE assist to </= 17 seconds from chair.    Baseline 19.19 seconds without UE assist from chair    Time 4    Period Weeks    Status New    Target Date 11/04/20      PT SHORT TERM GOAL #4   Title Patient will demonstrate optimal body mechanics and technique for bending and turning to allow for improved tolerance with sweeping, mopping, vacuuming, etc.    Baseline Unable to assess at eval but patient reports having symptom aggravation with these household tasks    Time 4    Period Weeks    Status New    Target Date 11/04/20             PT Long Term Goals - 10/07/20 1242      PT LONG TERM GOAL #1   Title Patient will be independent with advanced HEP.    Baseline Initial HEP to be issued next session    Time 8    Period Weeks    Status New    Target Date 12/02/20      PT LONG TERM GOAL #2   Title Patient will be able to sit in a chair for at least 30 minutes without onset of numbness in LLE    Baseline Pt reports numbness in LLE (lateral thigh mostly) within 10 minutes of sitting in chair during eval    Time 8    Period Weeks    Status New    Target Date 12/02/20      PT LONG TERM GOAL #3   Title Patient will report being able to ambulate outside of her house for at least 20 minutes with </= 3/10 L hip or low back pain.    Baseline 2/10 pain at rest with increased pain within 10 minutes of ambulation outdoor at desired  pace    Time 8    Period Weeks    Status New    Target Date 12/02/20      PT LONG TERM GOAL #4   Title Patient's FOTO score will increase from 47% to 50% function to demonstrate improved perceived ability.    Baseline 47% function; predicted 50% function    Time 8    Period Weeks    Status New    Target Date 12/02/20      PT LONG TERM GOAL #5   Title Patient will improve 5xSTS from 19.19 seconds without UE assist to </= 15 seconds from chair.    Baseline 19.19 seconds    Time 8    Period Weeks    Status New    Target Date 12/02/20  Plan - 10/22/20 1215    Clinical Impression Statement Pt reports she had her staples removed but still has an open would in abdomen. She has F/U again with surgeon tomorrow. She c/o left lateral hip pain today and pain into backs of legs. Measurements of hamstrings and hip ROM taken with patient demonstrating tightness in hips and hamstings. Began hip and hamstring stretching and updated HEP. Began massage rolling to left gluteal and ITB.    PT Next Visit Plan review and progress initial HEP next session. Further assess lumbar AROM and hip PROM if able. Manual techniques PRN. Initiate core strengthening as able. L IT band/TFL stretch and IASTM    PT Home Exercise Plan [id number]           Patient will benefit from skilled therapeutic intervention in order to improve the following deficits and impairments:  Decreased activity tolerance,Decreased balance,Decreased cognition,Decreased mobility,Decreased strength,Impaired sensation,Improper body mechanics,Postural dysfunction,Decreased endurance,Decreased range of motion,Decreased scar mobility,Difficulty walking,Increased muscle spasms,Pain  Visit Diagnosis: Chronic bilateral low back pain with bilateral sciatica  Difficulty in walking, not elsewhere classified     Problem List Patient Active Problem List   Diagnosis Date Noted  . CAD (coronary artery disease), native coronary  artery   . Diabetic hyperosmolar non-ketotic state ([place]) 05/15/2018  . Hyperlipidemia 05/15/2018  . GERD (gastroesophageal reflux disease) 05/15/2018  . Depression with anxiety 05/15/2018  . Cerebral thrombosis with cerebral infarction 11/22/2017  . Acute encephalopathy   . DKA (diabetic ketoacidoses) 11/18/2017  . Diabetic ketoacidosis ([place]) 11/18/2017  . Acute respiratory failure with hypoxia ([place])   . Acute renal failure ([place])   . Endotracheal tube present   . Septic shock ([place])   . Spondylolisthesis of lumbar region 11/15/2017  . PAC (premature atrial contraction) 09/04/2017  . Cervical spondylosis with radiculopathy 03/23/2017  . Hypoxia   . Villous adenoma of right colon 04/08/2016  . Leukocytosis 04/08/2016  . Anemia   . Diastolic dysfunction   . Mild aortic insufficiency   . Other nonspecific abnormal cardiovascular system function study 05/03/2013  . SOB (shortness of breath) 05/01/2013  . Atypical chest pain 02/28/2013  . Dysphagia 02/28/2013  . UTI (lower urinary tract infection) 07/06/2012  . Intractable nausea and vomiting 07/06/2012  . Blood loss anemia 07/06/2012  . Epigastric abdominal pain 06/27/2012  . Type II diabetes mellitus ([place]) 07/28/2011  . Hypothyroidism 07/28/2011  . Hypertension 07/28/2011  . Hepatic steatosis 07/28/2011    Dorene Ar, PTA 10/22/2020, 2:07 PM  [facility] [address] [place], [place], [address] Phone: [phone]   Fax:  [phone]  Name: [name] MRN: [id number] Date of Birth: 02-24-1951

## 2020-10-28 ENCOUNTER — Other Ambulatory Visit: Payer: Self-pay

## 2020-10-28 ENCOUNTER — Ambulatory Visit: Payer: Medicare Other

## 2020-10-28 DIAGNOSIS — M5442 Lumbago with sciatica, left side: Secondary | ICD-10-CM | POA: Diagnosis not present

## 2020-10-28 DIAGNOSIS — G8929 Other chronic pain: Secondary | ICD-10-CM

## 2020-10-28 DIAGNOSIS — R262 Difficulty in walking, not elsewhere classified: Secondary | ICD-10-CM

## 2020-10-28 NOTE — Therapy (Signed)
[facility] [place], [place], [address] Phone: [phone]   Fax:  [phone]  Physical Therapy Treatment  Patient Details  Name: [name] MRN: [id number] Date of Birth: 03/12/1951 Referring Provider (PT): Viona Gilmore D, NP   Encounter Date: 10/28/2020   PT End of Session - 10/28/20 1214    Visit Number 3    Number of Visits 9    Date for PT Re-Evaluation 12/05/20    Authorization Type [employer] [place] visit 6 and visit 10; PN visit 10; KX modifier visit 15    PT Start Time 1215    PT Stop Time 1300    PT Time Calculation (min) 45 min    Activity Tolerance Patient tolerated treatment well    Behavior During Therapy [facility] for tasks assessed/performed           Past Medical History:  Diagnosis Date  . Anemia    iron deficiency  . Arthritis   . Blood transfusion   . CAD (coronary artery disease), native coronary artery    coronary CTA showed a coronary Ca score of 506, mild CAD with 25-49% mRCA and moderate CAD with 50-69% sequential mRCA, 25-49% inferior sub branch of D1 and 25-49% in pLCx.    . Cervical spondylosis with myelopathy and radiculopathy   . CVA (cerebral vascular accident) ([place])    11/2017  . Depression   . Diabetes mellitus    Type II  . Diastolic dysfunction   . Diverticulitis   . DJD (degenerative joint disease)   . DVT (deep venous thrombosis) ([facility])    times 2 lower leg  . Endometriosis   . Family history of adverse reaction to anesthesia    mom and dad PONV  . Fibromyalgia   . GERD (gastroesophageal reflux disease)    occ  . Hiatal hernia   . History of kidney stones   . Hyperlipidemia   . Hypertension   . Hypothyroidism   . Insomnia   . Mild aortic insufficiency    by echo 04/2013  . Osteoarthritis    back and knee  . Ovarian cyst   . PONV (postoperative nausea and vomiting)   . RLS (restless legs syndrome)   . Thoracic compression fracture ([place])   . Uterine  fibroid   . UTI (lower urinary tract infection)   . Villous adenoma of right colon 04/08/2016  . Vitamin D deficiency disease     Past Surgical History:  Procedure Laterality Date  . ANTERIOR CERVICAL DECOMP/DISCECTOMY FUSION N/A 03/23/2017   Procedure: ANTERIOR CERVICAL DECOMPRESSION/DISCECTOMY FUSION, INTERBODY PROSTHESIS,PLATE CERVICAL THREE- CERVICAL FOUR, CERVICAL FOUR- CERVICAL FIVE, CERVICAL FIVE- CERVICAL SIX;  Surgeon: Newman Pies, MD;  Location: [place];  Service: Neurosurgery;  Laterality: N/A;  . ANTERIOR CERVICAL DECOMP/DISCECTOMY FUSION N/A 04/18/2019   Procedure: ANTERIOR CERVICAL DECOMPRESSION/DISCECTOMY  CERVICAL SIX- CERVICAL SEVEN;  Surgeon: Newman Pies, MD;  Location: [place];  Service: Neurosurgery;  Laterality: N/A;  anterior  . APPENDECTOMY    . BACK SURGERY  ,2005   April  2013 - spinal fusion@ [facility]  . bowel blockage surgery    . BREAST SURGERY     breast reduction  . CARDIAC CATHETERIZATION  04/2013   normal coronary arteries and normal LVF  . CARPAL TUNNEL RELEASE  06/05/2012   Procedure: CARPAL TUNNEL RELEASE;  Surgeon: Cammie Sickle., MD;  Location: [place];  Service: Orthopedics;  Laterality: Left;  . CESAREAN SECTION  x 2  . CHOLECYSTECTOMY    . COLECTOMY  04/08/2016  . DILATION AND CURETTAGE OF UTERUS    . DORSAL COMPARTMENT RELEASE  06/05/2012   Procedure: RELEASE DORSAL COMPARTMENT (DEQUERVAIN);  Surgeon: Cammie Sickle., MD;  Location: [facility];  Service: Orthopedics;  Laterality: Left;  Excision of mixoid cyst also  . EYE SURGERY     cataracts bilateral  . JOINT REPLACEMENT     Bilateral knee  . KNEE ARTHROPLASTY  09   lft partial  . KNEE ARTHROPLASTY     rt  . LAPAROSCOPIC PARTIAL COLECTOMY N/A 04/08/2016   Procedure: LAPAROSCOPIC ASSISTED ASCENDING COLECTOMY POSSIBLE OPEN COLECTOMY;  Surgeon: Fanny Skates, MD;  Location: [place];  Service: General;  Laterality: N/A;  . orthopedic surgeries      multiple  . REDUCTION MAMMAPLASTY Bilateral   . SHOULDER OPEN ROTATOR CUFF REPAIR     rt and lft  . TEE WITHOUT CARDIOVERSION N/A 11/23/2017   Procedure: TRANSESOPHAGEAL ECHOCARDIOGRAM (TEE);  Surgeon: Jerline Pain, MD;  Location[facility] END[facility]COPY;  Service: Cardiovascular;  Laterality: N/A;  . TUBAL LIGATION     btsp    There were no vitals filed for this visit.   Subjective Assessment - 10/28/20 1214    Subjective "My hip has been bothering me even more since last week. I have had x-rays but not an MRI. I rode all the way here with my seat warmers on because it helps a little bit but not much. I'm still wearing my brace. He said everything looked great in my abdomen. I did something yesterday - I don't know what I did, but it made it bleed just a little bit. He said it's not a direct command to wear the brace but I can since I feel better with it on. I didn't have the brace on yesterday and when I took my bandage off, it had bled. The only thing I can think of is that I picked up a bag of books yesterday and had to put my groceries up. I walked a mile or more yesterday and am trying to do that every other day at least."    Pertinent History See PMH above. small bowel resection with lysis of adhesions 09/24/2020    Limitations Sitting;Standing;Walking;House hold activities;Lifting    How long can you sit comfortably? uncomfortable - leans to R side and offloads LLE in sitting    How long can you stand comfortably? "might hit but don't notice I guess because I have my mind on something"    How long can you walk comfortably? "probably about 10 minutes maybe. If I'm walking slow at the grocery store, I am pushing a shopping cart and do okay. But I really feel it when I go for my walking outside by my house."    Diagnostic tests 06/15/2020 CT Lumbar: 1. Solid L2-S1 fusion without stenosis.  2. Mild disc bulging at L1-2 and small left paracentral disc  protrusion T12-L1 without stenosis.  3. Aortic  Atherosclerosis    Patient Stated Goals "I really want to walk without having to turn and tell my grandchild I have to go back inside. I want to work in the yard because I haven't been able to."    Currently in Pain? Yes    Pain Score 6     Pain Location Hip    Pain Orientation Left;Lateral    Pain Descriptors / Indicators Aching    Pain Type Chronic pain  Pain Onset More than a month ago              [facility] PT Assessment - 10/28/20 0001      Assessment   Medical Diagnosis Lumbago with sciatica, left side (M54.42)    Referring Provider (PT) Viona Gilmore D, NP      Precautions   Precaution Comments no lifting > gallon of milk and no bending forward per patient report                         [facility] Adult PT Treatment/Exercise - 10/28/20 0001      Knee/Hip Exercises: Stretches   Active Hamstring Stretch Left;1 rep;Other (comment)   90 seconds   Active Hamstring Stretch Limitations supine with green strap    Hip Flexor Stretch Limitations prone L hip flexor stretch with green strap 2 x 90 sec    ITB Stretch Left;1 rep;Other (comment)   90 seconds     Knee/Hip Exercises: Supine   Hip Adduction Isometric Limitations towel squeeze/hip ADD bilaterally x 20      Knee/Hip Exercises: Prone   Hamstring Curl 15 reps      Manual Therapy   Manual Therapy Soft tissue mobilization    Soft tissue mobilization Roller and IASTM along L lateral hip, glutes, ITB, vastus lateralis. STM/MFR along vastus lateralis, rectus femoris, TFL, and hip FL                  PT Education - 10/28/20 1605    Education Details HEP and to continue self STM/MFR and stretching as tolerated    Person(s) Educated Patient    Methods Explanation;Demonstration;Tactile cues;Verbal cues    Comprehension Verbalized understanding;Returned demonstration;Need further instruction            PT Short Term Goals - 10/07/20 1237      PT SHORT TERM GOAL #1   Title Patient will be independent  with initial HEP.    Baseline Initial HEP to be issued next session    Time 4    Period Weeks    Status New    Target Date 11/04/20      PT SHORT TERM GOAL #2   Title Patient will be able to sit in a chair for at least 15 minutes without onset of numbness in LLE    Baseline Pt reports numbness in LLE (lateral thigh mostly) within 10 minutes of sitting in chair during eval    Time 4    Period Weeks    Status New    Target Date 11/04/20      PT SHORT TERM GOAL #3   Title Patient will improve 5xSTS from 19.19 seconds without UE assist to </= 17 seconds from chair.    Baseline 19.19 seconds without UE assist from chair    Time 4    Period Weeks    Status New    Target Date 11/04/20      PT SHORT TERM GOAL #4   Title Patient will demonstrate optimal body mechanics and technique for bending and turning to allow for improved tolerance with sweeping, mopping, vacuuming, etc.    Baseline Unable to assess at eval but patient reports having symptom aggravation with these household tasks    Time 4    Period Weeks    Status New    Target Date 11/04/20             PT Long Term Goals - 10/07/20  [place] #1   Title Patient will be independent with advanced HEP.    Baseline Initial HEP to be issued next session    Time 8    Period Weeks    Status New    Target Date 12/02/20      PT LONG TERM GOAL #2   Title Patient will be able to sit in a chair for at least 30 minutes without onset of numbness in LLE    Baseline Pt reports numbness in LLE (lateral thigh mostly) within 10 minutes of sitting in chair during eval    Time 8    Period Weeks    Status New    Target Date 12/02/20      PT LONG TERM GOAL #3   Title Patient will report being able to ambulate outside of her house for at least 20 minutes with </= 3/10 L hip or low back pain.    Baseline 2/10 pain at rest with increased pain within 10 minutes of ambulation outdoor at desired pace    Time 8    Period  Weeks    Status New    Target Date 12/02/20      PT LONG TERM GOAL #4   Title Patient's FOTO score will increase from 47% to 50% function to demonstrate improved perceived ability.    Baseline 47% function; predicted 50% function    Time 8    Period Weeks    Status New    Target Date 12/02/20      PT LONG TERM GOAL #5   Title Patient will improve 5xSTS from 19.19 seconds without UE assist to </= 15 seconds from chair.    Baseline 19.19 seconds    Time 8    Period Weeks    Status New    Target Date 12/02/20                 Plan - 10/28/20 1215    Clinical Impression Statement Patient reports her follow up with surgeon regarding abdominal open wound and healing s/p small bowel resection with lysis of adhesions 09/24/2020. She experienced significant TTP along left IT band, vastus lateralis, and rectus femoris that was slightly eased but still present following manual techniques. Progressed light hip strengthening and stretching as tolerated with cues to avoid valsalva maneuver and no complaints of abdominal discomfort/pain. Pt presents with significant limitation upon prone left hip flexor stretch with ability to flex knee a little further after performing stretch 2 x 90 seconds following by prone hamstring curls. She should continue to benefit from skilled PT to address deficits and reduce low back and left hip pain.    PT Treatment/Interventions ADLs/Self Care Home Management;Aquatic Therapy;Electrical Stimulation;Iontophoresis 4mg /ml Dexamethasone;Moist Heat;Traction;Neuromuscular re-education;Balance training;Therapeutic exercise;Therapeutic activities;Functional mobility training;Stair training;Gait training;Patient/family education;Manual techniques;Energy conservation;Dry needling;Passive range of motion;Taping    PT Next Visit Plan Review and update HEP PRN. Manual techniques PRN. L IT band/TFL stretch and IASTM. Light hip strengthening as able. Discuss potential hold until  patient has healed from recent procedure due to limitations in ability to perform interventions and core strengthening that would be beneficial to address LBP vs continuing with light interventions as able.    PT Home Exercise Plan [id number]    Consulted and Agree with Plan of Care Patient           Patient will benefit from skilled therapeutic intervention in order to improve the following deficits and impairments:  Decreased activity tolerance,Decreased balance,Decreased cognition,Decreased mobility,Decreased strength,Impaired sensation,Improper body mechanics,Postural dysfunction,Decreased endurance,Decreased range of motion,Decreased scar mobility,Difficulty walking,Increased muscle spasms,Pain  Visit Diagnosis: Chronic bilateral low back pain with bilateral sciatica  Difficulty in walking, not elsewhere classified     Problem List Patient Active Problem List   Diagnosis Date Noted  . CAD (coronary artery disease), native coronary artery   . Diabetic hyperosmolar non-ketotic state ([place]) 05/15/2018  . Hyperlipidemia 05/15/2018  . GERD (gastroesophageal reflux disease) 05/15/2018  . Depression with anxiety 05/15/2018  . Cerebral thrombosis with cerebral infarction 11/22/2017  . Acute encephalopathy   . DKA (diabetic ketoacidoses) 11/18/2017  . Diabetic ketoacidosis ([place]) 11/18/2017  . Acute respiratory failure with hypoxia ([place])   . Acute renal failure ([place])   . Endotracheal tube present   . Septic shock ([place])   . Spondylolisthesis of lumbar region 11/15/2017  . PAC (premature atrial contraction) 09/04/2017  . Cervical spondylosis with radiculopathy 03/23/2017  . Hypoxia   . Villous adenoma of right colon 04/08/2016  . Leukocytosis 04/08/2016  . Anemia   . Diastolic dysfunction   . Mild aortic insufficiency   . Other nonspecific abnormal cardiovascular system function study 05/03/2013  . SOB (shortness of breath) 05/01/2013  . Atypical chest pain 02/28/2013  . Dysphagia  02/28/2013  . UTI (lower urinary tract infection) 07/06/2012  . Intractable nausea and vomiting 07/06/2012  . Blood loss anemia 07/06/2012  . Epigastric abdominal pain 06/27/2012  . Type II diabetes mellitus ([place]) 07/28/2011  . Hypothyroidism 07/28/2011  . Hypertension 07/28/2011  . Hepatic steatosis 07/28/2011     Haydee Monica, PT, DPT 10/28/20 5:25 PM  [facility] [address] [place], [place], [address] Phone: [phone]   Fax:  [phone]  Name: [name] MRN: [id number] Date of Birth: 04-06-1951

## 2020-11-04 ENCOUNTER — Ambulatory Visit: Payer: Medicare Other

## 2020-11-12 ENCOUNTER — Other Ambulatory Visit: Payer: Self-pay

## 2020-11-12 ENCOUNTER — Ambulatory Visit: Payer: Medicare Other | Attending: Student | Admitting: Physical Therapy

## 2020-11-12 ENCOUNTER — Encounter: Payer: Self-pay | Admitting: Physical Therapy

## 2020-11-12 DIAGNOSIS — M5441 Lumbago with sciatica, right side: Secondary | ICD-10-CM | POA: Insufficient documentation

## 2020-11-12 DIAGNOSIS — G8929 Other chronic pain: Secondary | ICD-10-CM

## 2020-11-12 DIAGNOSIS — R262 Difficulty in walking, not elsewhere classified: Secondary | ICD-10-CM | POA: Insufficient documentation

## 2020-11-12 DIAGNOSIS — M5442 Lumbago with sciatica, left side: Secondary | ICD-10-CM | POA: Insufficient documentation

## 2020-11-12 NOTE — Therapy (Signed)
[place], [place], [address] Phone: [phone]   Fax:  [phone]  Physical Therapy Treatment  Patient Details  Name: [name] MRN: [id number] Date of Birth: [birth year] Referring Provider (PT): Viona Gilmore D, NP   Encounter Date: 11/12/2020   PT End of Session - 11/12/20 1243    Visit Number 4    Number of Visits 9    Date for PT Re-Evaluation 12/05/20    Authorization Type UHC [place] visit 6 and visit 10; PN visit 10; KX modifier visit 15    PT Start Time 1232    PT Stop Time 1312    PT Time Calculation (min) 40 min           Past Medical History:  Diagnosis Date  . Anemia    iron deficiency  . Arthritis   . Blood transfusion   . CAD (coronary artery disease), native coronary artery    coronary CTA showed a coronary Ca score of 506, mild CAD with 25-49% mRCA and moderate CAD with 50-69% sequential mRCA, 25-49% inferior sub branch of D1 and 25-49% in pLCx.    . Cervical spondylosis with myelopathy and radiculopathy   . CVA (cerebral vascular accident) ([place])    11/2017  . Depression   . Diabetes mellitus    Type II  . Diastolic dysfunction   . Diverticulitis   . DJD (degenerative joint disease)   . DVT (deep venous thrombosis) ([facility])    times 2 lower leg  . Endometriosis   . Family history of adverse reaction to anesthesia    mom and dad PONV  . Fibromyalgia   . GERD (gastroesophageal reflux disease)    occ  . Hiatal hernia   . History of kidney stones   . Hyperlipidemia   . Hypertension   . Hypothyroidism   . Insomnia   . Mild aortic insufficiency    by echo 04/2013  . Osteoarthritis    back and knee  . Ovarian cyst   . PONV (postoperative nausea and vomiting)   . RLS (restless legs syndrome)   . Thoracic compression fracture ([place])   . Uterine fibroid   . UTI (lower urinary tract infection)   . Villous adenoma of right colon 04/08/2016  . Vitamin D deficiency  disease     Past Surgical History:  Procedure Laterality Date  . ANTERIOR CERVICAL DECOMP/DISCECTOMY FUSION N/A 03/23/2017   Procedure: ANTERIOR CERVICAL DECOMPRESSION/DISCECTOMY FUSION, INTERBODY PROSTHESIS,PLATE CERVICAL THREE- CERVICAL FOUR, CERVICAL FOUR- CERVICAL FIVE, CERVICAL FIVE- CERVICAL SIX;  Surgeon: Newman Pies, MD;  Location: [place];  Service: Neurosurgery;  Laterality: N/A;  . ANTERIOR CERVICAL DECOMP/DISCECTOMY FUSION N/A 04/18/2019   Procedure: ANTERIOR CERVICAL DECOMPRESSION/DISCECTOMY  CERVICAL SIX- CERVICAL SEVEN;  Surgeon: Newman Pies, MD;  Location: [place];  Service: Neurosurgery;  Laterality: N/A;  anterior  . APPENDECTOMY    . BACK SURGERY  ,2005   April  2013 - [facility]  . bowel blockage surgery    . BREAST SURGERY     breast reduction  . CARDIAC CATHETERIZATION  04/2013   normal coronary arteries and normal LVF  . CARPAL TUNNEL RELEASE  06/05/2012   Procedure: CARPAL TUNNEL RELEASE;  Surgeon: Cammie Sickle., MD;  Location: [place];  Service: Orthopedics;  Laterality: Left;  . CESAREAN SECTION     x 2  . CHOLECYSTECTOMY    . COLECTOMY  04/08/2016  . DILATION AND  CURETTAGE OF UTERUS    . DORSAL COMPARTMENT RELEASE  06/05/2012   Procedure: RELEASE DORSAL COMPARTMENT (DEQUERVAIN);  Surgeon: Cammie Sickle., MD;  Location: [facility];  Service: Orthopedics;  Laterality: Left;  Excision of mixoid cyst also  . EYE SURGERY     cataracts bilateral  . JOINT REPLACEMENT     Bilateral knee  . KNEE ARTHROPLASTY  09   lft partial  . KNEE ARTHROPLASTY     rt  . LAPAROSCOPIC PARTIAL COLECTOMY N/A 04/08/2016   Procedure: LAPAROSCOPIC ASSISTED ASCENDING COLECTOMY POSSIBLE OPEN COLECTOMY;  Surgeon: Fanny Skates, MD;  Location: [place];  Service: General;  Laterality: N/A;  . orthopedic surgeries     multiple  . REDUCTION MAMMAPLASTY Bilateral   . SHOULDER OPEN ROTATOR CUFF REPAIR     rt and lft  . TEE WITHOUT  CARDIOVERSION N/A 11/23/2017   Procedure: TRANSESOPHAGEAL ECHOCARDIOGRAM (TEE);  Surgeon: Jerline Pain, MD;  Location: [facility] ENDOSCOPY;  Service: Cardiovascular;  Laterality: N/A;  . TUBAL LIGATION     btsp    There were no vitals filed for this visit.   Subjective Assessment - 11/12/20 1237    Subjective I was hurting so bad yesterday in my whole body, especially in mu upper back. 6/10 in upper back right now.    Currently in Pain? Yes    Pain Score 6     Pain Location Thoracic    Pain Orientation Mid    Pain Descriptors / Indicators Aching    Pain Type Chronic pain    Aggravating Factors  household activity    Pain Relieving Factors nothing really                             [facility] Adult PT Treatment/Exercise - 11/12/20 0001      Self-Care   Self-Care Other Self-Care Comments    Other Self-Care Comments  use of tennis bal for self trigger point release to periscap muscles      Knee/Hip Exercises: Stretches   Active Hamstring Stretch Limitations seated EOM x 3 each    Other Knee/Hip Stretches Figure 4  push and pull    Other Knee/Hip Stretches lower trunk rotation, Upper trunk rotations, also seated trunk rotations -arms corssed      Knee/Hip Exercises: Seated   Other Seated Knee/Hip Exercises scap retract x 10      Knee/Hip Exercises: Supine   Other Supine Knee/Hip Exercises maintaining PPT with AROM clam bilateral    Other Supine Knee/Hip Exercises gentle PPT with no increased pain x 10, gentle scapular retraction sometrics into mat x 10                    PT Short Term Goals - 10/07/20 1237      PT SHORT TERM GOAL #1   Title Patient will be independent with initial HEP.    Baseline Initial HEP to be issued next session    Time 4    Period Weeks    Status New    Target Date 11/04/20      PT SHORT TERM GOAL #2   Title Patient will be able to sit in a chair for at least 15 minutes without onset of numbness in LLE    Baseline Pt reports  numbness in LLE (lateral thigh mostly) within 10 minutes of sitting in chair during eval    Time 4  Period Weeks    Status New    Target Date 11/04/20      PT SHORT TERM GOAL #3   Title Patient will improve 5xSTS from 19.19 seconds without UE assist to </= 17 seconds from chair.    Baseline 19.19 seconds without UE assist from chair    Time 4    Period Weeks    Status New    Target Date 11/04/20      PT SHORT TERM GOAL #4   Title Patient will demonstrate optimal body mechanics and technique for bending and turning to allow for improved tolerance with sweeping, mopping, vacuuming, etc.    Baseline Unable to assess at eval but patient reports having symptom aggravation with these household tasks    Time 4    Period Weeks    Status New    Target Date 11/04/20             PT Long Term Goals - 10/07/20 1242      PT LONG TERM GOAL #1   Title Patient will be independent with advanced HEP.    Baseline Initial HEP to be issued next session    Time 8    Period Weeks    Status New    Target Date 12/02/20      PT LONG TERM GOAL #2   Title Patient will be able to sit in a chair for at least 30 minutes without onset of numbness in LLE    Baseline Pt reports numbness in LLE (lateral thigh mostly) within 10 minutes of sitting in chair during eval    Time 8    Period Weeks    Status New    Target Date 12/02/20      PT LONG TERM GOAL #3   Title Patient will report being able to ambulate outside of her house for at least 20 minutes with </= 3/10 L hip or low back pain.    Baseline 2/10 pain at rest with increased pain within 10 minutes of ambulation outdoor at desired pace    Time 8    Period Weeks    Status New    Target Date 12/02/20      PT LONG TERM GOAL #4   Title Patient's FOTO score will increase from 47% to 50% function to demonstrate improved perceived ability.    Baseline 47% function; predicted 50% function    Time 8    Period Weeks    Status New    Target Date  12/02/20      PT LONG TERM GOAL #5   Title Patient will improve 5xSTS from 19.19 seconds without UE assist to </= 15 seconds from chair.    Baseline 19.19 seconds    Time 8    Period Weeks    Status New    Target Date 12/02/20                 Plan - 11/12/20 1243    Clinical Impression Statement Pt arrives reporting increased upper back pain and her "normal" amount of low back pain and intermittent bilateral hip pain. She reports abdominal wound has healed without any recent bleeding. She is able to participate in basic back stretches and gentle coe activation without increased pain. Began upper trunk rotations from sidelying which caused dizziness. Pt reports a history of inner ear trouble with turning her head. She improved after sitting on edge of mat. Able to perform gentle pelvic rocking and hamstring stretches. Instructed  pt in use of previously issued tennis ball to target periscular muscles as needed. Pt able to perform self trigger point release using tennis ball on  wall. Encouraged her to be mindul of posture throughout the day to avoid protective slouching.    PT Next Visit Plan Review and update HEP PRN (if tolerated last session, consider updating HEP). Manual techniques PRN. L IT band/TFL stretch and IASTM. Light hip strengthening as able. Discuss potential hold until patient has healed from recent procedure due to limitations in ability to perform interventions and core strengthening that would be beneficial to address LBP vs continuing with light interventions as able.    PT Home Exercise Plan [id number]    Consulted and Agree with Plan of Care Patient           Patient will benefit from skilled therapeutic intervention in order to improve the following deficits and impairments:  Decreased activity tolerance,Decreased balance,Decreased cognition,Decreased mobility,Decreased strength,Impaired sensation,Improper body mechanics,Postural dysfunction,Decreased  endurance,Decreased range of motion,Decreased scar mobility,Difficulty walking,Increased muscle spasms,Pain  Visit Diagnosis: Chronic bilateral low back pain with bilateral sciatica  Difficulty in walking, not elsewhere classified     Problem List Patient Active Problem List   Diagnosis Date Noted  . CAD (coronary artery disease), native coronary artery   . Diabetic hyperosmolar non-ketotic state ([place]) 05/15/2018  . Hyperlipidemia 05/15/2018  . GERD (gastroesophageal reflux disease) 05/15/2018  . Depression with anxiety 05/15/2018  . Cerebral thrombosis with cerebral infarction 11/22/2017  . Acute encephalopathy   . DKA (diabetic ketoacidoses) 11/18/2017  . Diabetic ketoacidosis ([place]) 11/18/2017  . Acute respiratory failure with hypoxia ([place])   . Acute renal failure ([place])   . Endotracheal tube present   . Septic shock ([place])   . Spondylolisthesis of lumbar region 11/15/2017  . PAC (premature atrial contraction) 09/04/2017  . Cervical spondylosis with radiculopathy 03/23/2017  . Hypoxia   . Villous adenoma of right colon 04/08/2016  . Leukocytosis 04/08/2016  . Anemia   . Diastolic dysfunction   . Mild aortic insufficiency   . Other nonspecific abnormal cardiovascular system function study 05/03/2013  . SOB (shortness of breath) 05/01/2013  . Atypical chest pain 02/28/2013  . Dysphagia 02/28/2013  . UTI (lower urinary tract infection) 07/06/2012  . Intractable nausea and vomiting 07/06/2012  . Blood loss anemia 07/06/2012  . Epigastric abdominal pain 06/27/2012  . Type II diabetes mellitus ([place]) 07/28/2011  . Hypothyroidism 07/28/2011  . Hypertension 07/28/2011  . Hepatic steatosis 07/28/2011   Hessie Diener, PTA 11/12/20 1:29 PM Phone: [phone] Fax: [facility] [address] [place], [place], [address] Phone: [phone]   Fax:  [phone]  Name: CALEB PRIGMORE MRN: [id number] Date of Birth:  08/01/1951

## 2020-11-17 ENCOUNTER — Other Ambulatory Visit: Payer: Self-pay

## 2020-11-17 ENCOUNTER — Ambulatory Visit: Payer: Medicare Other | Admitting: Physical Therapy

## 2020-11-17 ENCOUNTER — Encounter: Payer: Self-pay | Admitting: Physical Therapy

## 2020-11-17 DIAGNOSIS — M5442 Lumbago with sciatica, left side: Secondary | ICD-10-CM

## 2020-11-17 DIAGNOSIS — R262 Difficulty in walking, not elsewhere classified: Secondary | ICD-10-CM

## 2020-11-17 DIAGNOSIS — G8929 Other chronic pain: Secondary | ICD-10-CM

## 2020-11-17 NOTE — Therapy (Addendum)
[place], [place], [address] Phone: [phone]   Fax:  [phone]  Physical Therapy Treatment / Discharge  Patient Details  Name: CARAL WHAN MRN: [id number] Date of Birth: 30-Jun-1951 Referring Provider (PT): Viona Gilmore D, NP   Encounter Date: 11/17/2020   PT End of Session - 11/17/20 1246    Visit Number 5    Number of Visits 9    Date for PT Re-Evaluation 12/05/20    Authorization Type UHC [place] visit 6 and visit 10; PN visit 10; KX modifier visit 15    PT Start Time 1232    PT Stop Time 1315    PT Time Calculation (min) 43 min           Past Medical History:  Diagnosis Date  . Anemia    iron deficiency  . Arthritis   . Blood transfusion   . CAD (coronary artery disease), native coronary artery    coronary CTA showed a coronary Ca score of 506, mild CAD with 25-49% mRCA and moderate CAD with 50-69% sequential mRCA, 25-49% inferior sub branch of D1 and 25-49% in pLCx.    . Cervical spondylosis with myelopathy and radiculopathy   . CVA (cerebral vascular accident) ([place])    11/2017  . Depression   . Diabetes mellitus    Type II  . Diastolic dysfunction   . Diverticulitis   . DJD (degenerative joint disease)   . DVT (deep venous thrombosis) ([facility])    times 2 lower leg  . Endometriosis   . Family history of adverse reaction to anesthesia    mom and dad PONV  . Fibromyalgia   . GERD (gastroesophageal reflux disease)    occ  . Hiatal hernia   . History of kidney stones   . Hyperlipidemia   . Hypertension   . Hypothyroidism   . Insomnia   . Mild aortic insufficiency    by echo 04/2013  . Osteoarthritis    back and knee  . Ovarian cyst   . PONV (postoperative nausea and vomiting)   . RLS (restless legs syndrome)   . Thoracic compression fracture ([place])   . Uterine fibroid   . UTI (lower urinary tract infection)   . Villous adenoma of right colon 04/08/2016  . Vitamin D  deficiency disease     Past Surgical History:  Procedure Laterality Date  . ANTERIOR CERVICAL DECOMP/DISCECTOMY FUSION N/A 03/23/2017   Procedure: ANTERIOR CERVICAL DECOMPRESSION/DISCECTOMY FUSION, INTERBODY PROSTHESIS,PLATE CERVICAL THREE- CERVICAL FOUR, CERVICAL FOUR- CERVICAL FIVE, CERVICAL FIVE- CERVICAL SIX;  Surgeon: Newman Pies, MD;  Location: [place];  Service: Neurosurgery;  Laterality: N/A;  . ANTERIOR CERVICAL DECOMP/DISCECTOMY FUSION N/A 04/18/2019   Procedure: ANTERIOR CERVICAL DECOMPRESSION/DISCECTOMY  CERVICAL SIX- CERVICAL SEVEN;  Surgeon: Newman Pies, MD;  Location: [place];  Service: Neurosurgery;  Laterality: N/A;  anterior  . APPENDECTOMY    . BACK SURGERY  ,2005   April  2013 - [facility]  . bowel blockage surgery    . BREAST SURGERY     breast reduction  . CARDIAC CATHETERIZATION  04/2013   normal coronary arteries and normal LVF  . CARPAL TUNNEL RELEASE  06/05/2012   Procedure: CARPAL TUNNEL RELEASE;  Surgeon: Cammie Sickle., MD;  Location: [place];  Service: Orthopedics;  Laterality: Left;  . CESAREAN SECTION     x 2  . CHOLECYSTECTOMY    . COLECTOMY  04/08/2016  .  DILATION AND CURETTAGE OF UTERUS    . DORSAL COMPARTMENT RELEASE  06/05/2012   Procedure: RELEASE DORSAL COMPARTMENT (DEQUERVAIN);  Surgeon: Cammie Sickle., MD;  Location: [facility];  Service: Orthopedics;  Laterality: Left;  Excision of mixoid cyst also  . EYE SURGERY     cataracts bilateral  . JOINT REPLACEMENT     Bilateral knee  . KNEE ARTHROPLASTY  09   lft partial  . KNEE ARTHROPLASTY     rt  . LAPAROSCOPIC PARTIAL COLECTOMY N/A 04/08/2016   Procedure: LAPAROSCOPIC ASSISTED ASCENDING COLECTOMY POSSIBLE OPEN COLECTOMY;  Surgeon: Fanny Skates, MD;  Location: [place];  Service: General;  Laterality: N/A;  . orthopedic surgeries     multiple  . REDUCTION MAMMAPLASTY Bilateral   . SHOULDER OPEN ROTATOR CUFF REPAIR     rt and lft  . TEE  WITHOUT CARDIOVERSION N/A 11/23/2017   Procedure: TRANSESOPHAGEAL ECHOCARDIOGRAM (TEE);  Surgeon: Jerline Pain, MD;  Location: [facility] ENDOSCOPY;  Service: Cardiovascular;  Laterality: N/A;  . TUBAL LIGATION     btsp    There were no vitals filed for this visit.   Subjective Assessment - 11/17/20 1234    Subjective The back has give me a fit friday and saturday.  The stretches did not help. I had to lay on the hot pack and take tylenol.    Currently in Pain? Yes    Pain Score 4     Pain Location Back    Pain Orientation Mid;Lower    Pain Descriptors / Indicators Aching    Pain Type Chronic pain    Aggravating Factors  household activity    Pain Relieving Factors nothing really                             [facility] Adult PT Treatment/Exercise - 11/17/20 0001      Transfers   Five time sit to stand comments  12.8 sec      Knee/Hip Exercises: Stretches   Active Hamstring Stretch Limitations seated EOM x 3 each    Other Knee/Hip Stretches Figure 4  push and pull, single knee to chest x 1 each    Other Knee/Hip Stretches lower trunk rotations      Knee/Hip Exercises: Aerobic   Nustep L1 LE only x 5 minutes      Knee/Hip Exercises: Seated   Other Seated Knee/Hip Exercises scap retract x 10      Knee/Hip Exercises: Supine   Hip Adduction Isometric Limitations balll squeeze x10 with abdominal draw    Other Supine Knee/Hip Exercises maintaining PPT with AROM clam bilateral, then with red band clam x 10 cues for ab brace    Other Supine Knee/Hip Exercises gentle PPT with no increased pain x 10, gentle scapular retraction sometrics into mat x 10                    PT Short Term Goals - 10/07/20 1237      PT SHORT TERM GOAL #1   Title Patient will be independent with initial HEP.    Baseline Initial HEP to be issued next session    Time 4    Period Weeks    Status New    Target Date 11/04/20      PT SHORT TERM GOAL #2   Title Patient will be able to sit  in a chair for at least 15 minutes without onset  of numbness in LLE    Baseline Pt reports numbness in LLE (lateral thigh mostly) within 10 minutes of sitting in chair during eval    Time 4    Period Weeks    Status New    Target Date 11/04/20      PT SHORT TERM GOAL #3   Title Patient will improve 5xSTS from 19.19 seconds without UE assist to </= 17 seconds from chair.    Baseline 19.19 seconds without UE assist from chair    Time 4    Period Weeks    Status New    Target Date 11/04/20      PT SHORT TERM GOAL #4   Title Patient will demonstrate optimal body mechanics and technique for bending and turning to allow for improved tolerance with sweeping, mopping, vacuuming, etc.    Baseline Unable to assess at eval but patient reports having symptom aggravation with these household tasks    Time 4    Period Weeks    Status New    Target Date 11/04/20             PT Long Term Goals - 11/17/20 1433      PT LONG TERM GOAL #1   Title Patient will be independent with advanced HEP.    Time 8    Period Weeks    Status On-going      PT LONG TERM GOAL #2   Title Patient will be able to sit in a chair for at least 30 minutes without onset of numbness in LLE    Time 8    Period Weeks    Status On-going      PT LONG TERM GOAL #3   Title Patient will report being able to ambulate outside of her house for at least 20 minutes with </= 3/10 L hip or low back pain.    Baseline 2/10 pain at rest with increased pain within 10 minutes of ambulation outdoor at desired pace    Time 8    Period Weeks    Status On-going      PT LONG TERM GOAL #4   Title Patient's FOTO score will increase from 47% to 50% function to demonstrate improved perceived ability.    Time 8    Period Weeks    Status On-going      PT LONG TERM GOAL #5   Baseline improved to 12.8    Time 8    Period Weeks    Status Achieved                 Plan - 11/17/20 1429    Clinical Impression Statement Pt  reports increased pain for 2 days after last session located in upper and lower back. Her pain is a 4/10 today. Able to continue with gentle coreand hip activation. Continued with hip and trunk stretching as tolerated. She reported she was able to don her shoes and socks independently today (seated figure 4 positions) for the first time.  Her 5 x STS to has improved. No compliants of pain during session. Began Level 1 Nustep with good tolerance. She will be out of town for 1 week. LTG# 5 met.    PT Next Visit Plan Review and update HEP PRN (if tolerated last session, consider updating HEP). Manual techniques PRN. L IT band/TFL stretch and IASTM. Light hip strengthening as able. Discuss potential hold until patient has healed from recent procedure due to limitations in ability to perform  interventions and core strengthening that would be beneficial to address LBP vs continuing with light interventions as able.    PT Home Exercise Plan [id number]           Patient will benefit from skilled therapeutic intervention in order to improve the following deficits and impairments:  Decreased activity tolerance,Decreased balance,Decreased cognition,Decreased mobility,Decreased strength,Impaired sensation,Improper body mechanics,Postural dysfunction,Decreased endurance,Decreased range of motion,Decreased scar mobility,Difficulty walking,Increased muscle spasms,Pain  Visit Diagnosis: Difficulty in walking, not elsewhere classified  Chronic bilateral low back pain with bilateral sciatica     Problem List Patient Active Problem List   Diagnosis Date Noted  . CAD (coronary artery disease), native coronary artery   . Diabetic hyperosmolar non-ketotic state ([place]) 05/15/2018  . Hyperlipidemia 05/15/2018  . GERD (gastroesophageal reflux disease) 05/15/2018  . Depression with anxiety 05/15/2018  . Cerebral thrombosis with cerebral infarction 11/22/2017  . Acute encephalopathy   . DKA (diabetic ketoacidoses)  11/18/2017  . Diabetic ketoacidosis ([place]) 11/18/2017  . Acute respiratory failure with hypoxia ([place])   . Acute renal failure ([place])   . Endotracheal tube present   . Septic shock ([place])   . Spondylolisthesis of lumbar region 11/15/2017  . PAC (premature atrial contraction) 09/04/2017  . Cervical spondylosis with radiculopathy 03/23/2017  . Hypoxia   . Villous adenoma of right colon 04/08/2016  . Leukocytosis 04/08/2016  . Anemia   . Diastolic dysfunction   . Mild aortic insufficiency   . Other nonspecific abnormal cardiovascular system function study 05/03/2013  . SOB (shortness of breath) 05/01/2013  . Atypical chest pain 02/28/2013  . Dysphagia 02/28/2013  . UTI (lower urinary tract infection) 07/06/2012  . Intractable nausea and vomiting 07/06/2012  . Blood loss anemia 07/06/2012  . Epigastric abdominal pain 06/27/2012  . Type II diabetes mellitus ([place]) 07/28/2011  . Hypothyroidism 07/28/2011  . Hypertension 07/28/2011  . Hepatic steatosis 07/28/2011    Dorene Ar, PTA 11/17/2020, 2:34 PM  [facility] [address] [place], [place], [address] Phone: [phone]   Fax:  [phone]  Name: RANE DUMM MRN: [id number] Date of Birth: 07-01-1951     PHYSICAL THERAPY DISCHARGE SUMMARY  Visits from Start of Care: 5  Current functional level related to goals / functional outcomes: See goals   Remaining deficits: Current status unknown   Education / Equipment: HEP  Plan: Patient agrees to discharge.  Patient goals were not met. Patient is being discharged due to not returning since the last visit.  ?????         Kristoffer Leamon PT, DPT, LAT, ATC  01/06/21  10:24 AM

## 2020-12-03 ENCOUNTER — Ambulatory Visit: Payer: Medicare Other | Admitting: Physical Therapy

## 2020-12-09 ENCOUNTER — Ambulatory Visit: Payer: Medicare Other

## 2021-01-14 ENCOUNTER — Ambulatory Visit (INDEPENDENT_AMBULATORY_CARE_PROVIDER_SITE_OTHER): Payer: Medicare Other | Admitting: Family Medicine

## 2021-01-14 ENCOUNTER — Other Ambulatory Visit: Payer: Self-pay | Admitting: Cardiology

## 2021-01-14 ENCOUNTER — Encounter: Payer: Self-pay | Admitting: Family Medicine

## 2021-01-14 VITALS — BP 123/62 | HR 77 | Ht 61.0 in | Wt 217.0 lb

## 2021-01-14 DIAGNOSIS — G43709 Chronic migraine without aura, not intractable, without status migrainosus: Secondary | ICD-10-CM | POA: Diagnosis not present

## 2021-01-14 MED ORDER — IMIPRAMINE HCL 50 MG PO TABS: 50.0000 mg | ORAL_TABLET | Freq: Every day | ORAL | 3 refills | Status: DC

## 2021-01-14 NOTE — Patient Instructions (Signed)
Below is our plan:  We will continue imipramine 50mg  at bedtime. Call me for any worsening.   Please make sure you are staying well hydrated. I recommend 50-60 ounces daily. Well balanced diet and regular exercise encouraged. Consistent sleep schedule with 6-8 hours recommended.   Please continue follow up with care team as directed.   Follow up with me in 1 year   You may receive a survey regarding today's visit. I encourage you to leave honest feed back as I do use this information to improve patient care. Thank you for seeing me today!

## 2021-01-14 NOTE — Progress Notes (Signed)
PATIENT: [name] DOB: 1950-11-17  REASON FOR VISIT: follow up HISTORY FROM: patient  Chief Complaint  Patient presents with   Follow-up    RM 1, alone. Here for migraine f/u, pt states she is doing well overall.       HISTORY OF PRESENT ILLNESS: 01/14/2021 ALL: [name] returns for follow up for migraines. She continues impramine 50mg  at bedtime. She reports headaches are significantly improved. She may take Tylenol a couple times a month and usually aborts headache. She had emergency surgery in 09/2019 for perforated bowel with sepsis. She is recovering well. She is followed closely by GI and PCP. She had the flu last week.   07/16/2020 MM: [name] is a 70 year old female with a history of migraine headaches.  She returns today for follow-up.  She is currently on imipramine 50 mg at bedtime.  She states that since she started this medication she has not had any headaches.  She states that has been working  well for her.  She denies any significant side effects.  She returns today for an evaluation.  04/14/2020 ALL: [name] is a 70 y.o. female here today for follow up for migraines. She was unable to afford Ajovy or Amovig. We have not tried [employer]. She continues to have daily headaches. She feels a lot are triggered by chronic sinus infections. She admits that blood sugars are up and down. Stress definitely contributes. She has chronic pain. She is no longer on opioid therapy. She continues gabapentin and duloxetine.   She does snore. She feels that this is related to her having chronic sinus issues. She occasionally wakes with headache but feels headaches more frequently get worse throughout the day. She denies frequent waking. She denies excessive daytime sleepiness. She denies waking with dry mouth.   She continues regular follow up with PCP, pain management and endocrinology. She is on daily meal replacement insulin as well as daily basil insulin. CBGs "are up and down".  Fasting reading this morning was 200. Last A1C 10. BP usually well managed. She continues metoprolol.    HISTORY: (copied from my note on 09/10/2019)  [name] is a 70 y.o. female here today for follow up for new onset headaches. She was started on Ajovy for concerns of daily headaches with migrainous features. MRI normal. She reports that headaches improved in December. She repeated injection in January and does not feel that it was as effective. She has had trouble with sinus infection, sore throat, allergy symptoms and feels that has contributed as well. She no longer has daily headaches. She feels that she had a headache at least 10-15 days last month. Still has a lot of stress. CBG's shot up around 400 after taking doxycycline and prednisone, course completed last week. BP is up and down. She has not taken her BP meds today because she has not felt well. She is having neck pain, s/p ACDF in 04/2019. She continues to follow with Dr Maryjean Ka with [facility]. She has had to take oxycodone more recently due to neck pain.     HISTORY: (copied from Dr Cathren Laine note on 07/10/2019)   HPI:  [name] is a 70 y.o. female here as requested by Harlan Stains, MD for headache.  Past medical history hypertension, DVT on Xarelto, ACDF cervical, diabetes, fibromyalgia, aortic insufficiency.  Being sent for headache.  Patient was seen in the emergency room in July of this year with elevated high blood pressure  complaining of headache on the top of the head in the setting of blood pressure 180s over 90s throughout the day, it was 198/100 before she took her blood pressure medications but never significantly decreased, no other focal neurologic symptoms, chronic pain in her neck, she was taking Mucinex which may have been contributing to her hypertension.  CT of the head was negative.  Hydralazine was given, suspected headache due to blood pressure elevation, patient was feeling better when blood pressure improved  to the 150s to the 160s.   Mother and grandmother had headaches. Headaches recently started 7-8 months ago. No inciting events, no new medications or head trauma or anything she can tie it back to. Gradually started. [place] trigers. She has pain in the face behind the eyes and in the sinuses, she was told she had sinusitis but Dr. Redmond Baseman [facility] said she didn't. The pain comes behind her head, up the neck, and radiates to the temples, feels liks her head is bouncing, pounding and pulsating and throbbing, dull ache, she recently had an ACDF and no changes to the headache since then, not better or worse, she wakes up with the headaches every day, not overly tired during the day, she has headaches every day, she has tylenol, advil, oxycodone, flexeril, lorazepam, heat, ice, no difference, she reports no significant vision changes, the headache is positional and exertional and bending over for example made her hurt and get dizzy.    Reviewed notes, labs and imaging from outside physicians, which showed:   Personally reviewed CT of the head report February 26, 2019 which showed unremarkable brain.   REVIEW OF SYSTEMS: Out of a complete 14 system review of symptoms, the patient complains only of the following symptoms, chronic headaches, chronic sinusitis, chronic pain, fatigue, anxiety, and all other reviewed systems are negative.   ALLERGIES: Allergies  Allergen Reactions   Dilaudid [Hydromorphone Hcl] Other (See Comments)    Hallucinations/ argumentative, goes beserk   Heparin Other (See Comments)    HIT ab positive  (SRA negative 11/25/17)    Ambien [Zolpidem Tartrate] Other (See Comments)    Up sleep walking and eating   Codeine Nausea And Vomiting   Morphine And Related Other (See Comments)    Flushing and feeling hot Tolerates with Diphenhydramine   Penicillins Rash and Other (See Comments)    Caused Headaches also Has patient had a PCN reaction causing immediate rash, facial/tongue/throat  swelling, SOB or lightheadedness with hypotension: No Has patient had a PCN reaction causing severe rash involving mucus membranes or skin necrosis: No Has patient had a PCN reaction that required hospitalization No Has patient had a PCN reaction occurring within the last 10 years: No If all of the above answers are "NO", then may proceed with Cephalosporin use.     Statins Other (See Comments)    Muscle weakness, cramps and aching all over body   Suvorexant     ineffective Other reaction(s): Other ineffective   Amitriptyline Other (See Comments)    Loopy feeling   Ceftin [Cefuroxime Axetil] Other (See Comments)    Stomach pain    Lovaza [Omega-3-Acid Ethyl Esters] Other (See Comments)    Stomach pain    Lunesta [Eszopiclone] Other (See Comments)    Causes dizziness   Lyrica [Pregabalin] Nausea Only   Metformin And Related Nausea Only    HOME MEDICATIONS: Outpatient Medications Prior to Visit  Medication Sig Dispense Refill   acetaminophen (TYLENOL) 500 MG tablet Take 1,000 mg by mouth  every 8 (eight) hours as needed for mild pain or headache.     amLODipine (NORVASC) 2.5 MG tablet Take 5 mg by mouth daily.      aspirin EC 81 MG tablet Take 81 mg by mouth daily.     Cholecalciferol (VITAMIN D3) 10 MCG (400 UNIT) CHEW Chew by mouth. OTC     Cyanocobalamin (B-12) 2500 MCG TABS Take 2,500 mcg by mouth daily.     cyclobenzaprine (FLEXERIL) 10 MG tablet Take 1 tablet (10 mg total) by mouth 3 (three) times daily as needed for muscle spasms. 30 tablet 0   dexlansoprazole (DEXILANT) 60 MG capsule Take 60 mg by mouth daily.     docusate sodium (COLACE) 100 MG capsule Take 1 capsule (100 mg total) by mouth 2 (two) times daily. 60 capsule 0   DULoxetine (CYMBALTA) 60 MG capsule Take 60 mg by mouth daily.      ferrous sulfate 325 (65 FE) MG tablet Take 1 tablet (325 mg total) by mouth 2 (two) times daily with a meal. 60 tablet 3   fluticasone (FLONASE) 50 MCG/ACT nasal spray Place 2 sprays  into both nostrils daily as needed for allergies or rhinitis.     furosemide (LASIX) 20 MG tablet Take 20 mg by mouth daily as needed for fluid or edema.      gabapentin (NEURONTIN) 300 MG capsule Take 600 mg by mouth at bedtime.   1   ibandronate (BONIVA) 150 MG tablet Take 150 mg by mouth every 30 (thirty) days.     ibuprofen (ADVIL) 200 MG tablet Take 200 mg by mouth 3 (three) times daily as needed for headache (pain).     insulin glargine (LANTUS) 100 UNIT/ML injection Inject 32 Units into the skin daily. Takes in am     insulin lispro (HUMALOG) 100 UNIT/ML KwikPen Inject 18 Units into the skin 3 (three) times daily.      irbesartan (AVAPRO) 300 MG tablet Take 300 mg by mouth daily.      levothyroxine (SYNTHROID, LEVOTHROID) 150 MCG tablet Take 150 mcg by mouth daily before breakfast.  5   LORazepam (ATIVAN) 0.5 MG tablet TAKE 1 TABLET AS NEEDED FOR ANXIETY EVERY 8 HRS     Magnesium 100 MG CAPS Take by mouth. OTC     meclizine (ANTIVERT) 12.5 MG tablet Take 25 mg by mouth 3 (three) times daily as needed for dizziness.     metoprolol succinate (TOPROL-XL) 25 MG 24 hr tablet Take 1 tablet (25 mg total) by mouth daily. PATIENT NEED TO SCHEDULE  AN APPOINTMENT TO RECEIVE FUTURE REFILLS. 90 tablet 3   ondansetron (ZOFRAN ODT) 4 MG disintegrating tablet Take 1 tablet (4 mg total) by mouth every 8 (eight) hours as needed for nausea or vomiting. 20 tablet 0   pantoprazole (PROTONIX) 40 MG tablet Take 40 mg by mouth daily.     Probiotic Product (PROBIOTIC PO) Take 1 capsule by mouth daily.     promethazine (PHENERGAN) 25 MG tablet Take 25 mg by mouth every 8 (eight) hours as needed for nausea or vomiting.     rosuvastatin (CRESTOR) 5 MG tablet TAKE 1 TABLET BY MOUTH EVERY DAY 90 tablet 3   temazepam (RESTORIL) 15 MG capsule Take 15 mg by mouth at bedtime.   4   Galcanezumab-gnlm (EMGALITY) 120 MG/ML SOAJ Inject 120 mg into the skin every 30 (thirty) days. 2 mL 0   imipramine (TOFRANIL) 50 MG tablet  Take 1 tablet (50 mg total) by  mouth daily. 90 tablet 3   sucralfate (CARAFATE) 1 g tablet Take 1 g by mouth 4 (four) times daily.     No facility-administered medications prior to visit.    PAST MEDICAL HISTORY: Past Medical History:  Diagnosis Date   Anemia    iron deficiency   Arthritis    Blood transfusion    CAD (coronary artery disease), native coronary artery    coronary CTA showed a coronary Ca score of 506, mild CAD with 25-49% mRCA and moderate CAD with 50-69% sequential mRCA, 25-49% inferior sub branch of D1 and 25-49% in pLCx.     Cervical spondylosis with myelopathy and radiculopathy    CVA (cerebral vascular accident) ([place])    11/2017   Depression    Diabetes mellitus    Type II   Diastolic dysfunction    Diverticulitis    DJD (degenerative joint disease)    DVT (deep venous thrombosis) (H[facility])    times 2 lower leg   Endometriosis    Family history of adverse reaction to anesthesia    mom and dad PONV   Fibromyalgia    GERD (gastroesophageal reflux disease)    occ   Hiatal hernia    History of kidney stones    Hyperlipidemia    Hypertension    Hypothyroidism    Insomnia    Mild aortic insufficiency    by echo 04/2013   Osteoarthritis    back and knee   Ovarian cyst    PONV (postoperative nausea and vomiting)    RLS (restless legs syndrome)    Thoracic compression fracture (HCC)    Uterine fibroid    UTI (lower urinary tract infection)    Villous adenoma of right colon 04/08/2016   Vitamin D deficiency disease     PAST SURGICAL HISTORY: Past Surgical History:  Procedure Laterality Date   ANTERIOR CERVICAL DECOMP/DISCECTOMY FUSION N/A 03/23/2017   Procedure: ANTERIOR CERVICAL DECOMPRESSION/DISCECTOMY FUSION, INTERBODY PROSTHESIS,PLATE CERVICAL THREE- CERVICAL FOUR, CERVICAL FOUR- CERVICAL FIVE, CERVICAL FIVE- CERVICAL SIX;  Surgeon: Newman Pies, MD;  Location: [place];  Service: Neurosurgery;  Laterality: N/A;   ANTERIOR CERVICAL DECOMP/DISCECTOMY  FUSION N/A 04/18/2019   Procedure: ANTERIOR CERVICAL DECOMPRESSION/DISCECTOMY  CERVICAL SIX- CERVICAL SEVEN;  Surgeon: Newman Pies, MD;  Location: [place];  Service: Neurosurgery;  Laterality: N/A;  anterior   APPENDECTOMY     BACK SURGERY  2005   April  2013 - [facility] fusion[facility]   bowel blockage surgery     BREAST SURGERY     breast reduction   CARDIAC CATHETERIZATION  04/2013   normal coronary arteries and normal LVF   CARPAL TUNNEL RELEASE  06/05/2012   Procedure: CARPAL TUNNEL RELEASE;  Surgeon: Cammie Sickle., MD;  Location: [facility];  Service: Orthopedics;  Laterality: Left;   CESAREAN SECTION     x 2   CHOLECYSTECTOMY     COLECTOMY  04/08/2016   DILATION AND CURETTAGE OF UTERUS     DORSAL COMPARTMENT RELEASE  06/05/2012   Procedure: RELEASE DORSAL COMPARTMENT (DEQUERVAIN);  Surgeon: Cammie Sickle., MD;  Location[facility];  Service: Orthopedics;  Laterality: Left;  Excision of mixoid cyst also   EYE SURGERY     cataracts bilateral   JOINT REPLACEMENT     Bilateral knee   KNEE ARTHROPLASTY  2009   lft partial   KNEE ARTHROPLASTY     rt   LAPAROSCOPIC PARTIAL COLECTOMY N/A 04/08/2016   Procedure: LAPAROSCOPIC  ASSISTED ASCENDING COLECTOMY POSSIBLE OPEN COLECTOMY;  Surgeon: Fanny Skates, MD;  Location: [place];  Service: General;  Laterality: N/A;   orthopedic surgeries     multiple   perforated bowl     Sep 09 2020   REDUCTION MAMMAPLASTY Bilateral    SHOULDER OPEN ROTATOR CUFF REPAIR     rt and lft   TEE WITHOUT CARDIOVERSION N/A 11/23/2017   Procedure: TRANSESOPHAGEAL ECHOCARDIOGRAM (TEE);  Surgeon: Jerline Pain, MD;  Location: [facility];  Service: Cardiovascular;  Laterality: N/A;   TUBAL LIGATION     btsp    FAMILY HISTORY: Family History  Problem Relation Age of Onset   Heart attack Father        69s   Hypothyroidism Father    Diabetes type II Father    Kidney failure Father    Diabetes type II Mother     Hypertension Mother    Breast cancer Mother 27   Dementia Mother    Hypothyroidism Brother    Diabetes Brother    Breast cancer Maternal Aunt    Breast cancer Cousin    Breast cancer Maternal Aunt    Breast cancer Cousin    High blood pressure Other        "all"   Heart attack Brother    Lung cancer Brother     SOCIAL HISTORY: Social History   Socioeconomic History   Marital status: Married    Spouse name: Not on file   Number of children: 2   Years of education: trade school   Highest education level: Not on file  Occupational History   Not on file  Tobacco Use   Smoking status: Never   Smokeless tobacco: Never  Vaping Use   Vaping Use: Never used  Substance and Sexual Activity   Alcohol use: Never   Drug use: Never   Sexual activity: Yes    Birth control/protection: Post-menopausal, Surgical  Other Topics Concern   Not on file  Social History Narrative   Lives at home with her husband   Right handed   Caffeine: 2-3 cups of coffee/day   Social Determinants of Health   Financial Resource Strain: Not on file  Food Insecurity: Not on file  Transportation Needs: Not on file  Physical Activity: Not on file  Stress: Not on file  Social Connections: Not on file  Intimate Partner Violence: Not on file      PHYSICAL EXAM  Vitals:   01/14/21 1129  BP: 123/62  Pulse: 77  Weight: 217 lb (98.4 kg)  Height: 5\' 1"  (1.549 m)    Body mass index is 41 kg/m.  Generalized: Well developed, in no acute distress  Cardiology: normal rate and rhythm, no murmur noted Respiratory: clear to auscultation bilaterally  Neurological examination  Mentation: Alert oriented to time, place, history taking. Follows all commands speech and language fluent Cranial nerve II-XII: Pupils were equal round reactive to light. Extraocular movements were full, visual field were full on confrontational test. Facial sensation and strength were normal. Uvula tongue midline. Head turning and  shoulder shrug  were normal and symmetric. Motor: The motor testing reveals 5 over 5 strength of all 4 extremities. Good symmetric motor tone is noted throughout.  Gait and station: Gait is normal.    DIAGNOSTIC DATA (LABS, IMAGING, TESTING) - I reviewed patient records, labs, notes, testing and imaging myself where available.  No flowsheet data found.   Lab Results  Component Value Date   WBC 7.9  12/02/2019   HGB 14.0 12/02/2019   HCT 42.2 12/02/2019   MCV 86.7 12/02/2019   PLT 302 12/02/2019      Component Value Date/Time   NA 133 (L) 02/18/2020 1119   K 4.7 02/18/2020 1119   CL 96 02/18/2020 1119   CO2 25 02/18/2020 1119   GLUCOSE 434 (H) 02/18/2020 1119   GLUCOSE 206 (H) 12/02/2019 1523   BUN 14 02/18/2020 1119   CREATININE 0.93 02/18/2020 1119   CALCIUM 9.4 02/18/2020 1119   PROT 7.0 12/02/2019 1523   PROT 6.5 07/10/2019 1442   ALBUMIN 3.8 12/02/2019 1523   ALBUMIN 4.1 07/10/2019 1442   AST 17 12/02/2019 1523   ALT 15 12/02/2019 1523   ALKPHOS 125 12/02/2019 1523   BILITOT 0.4 12/02/2019 1523   BILITOT 0.4 07/10/2019 1442   GFRNONAA 63 02/18/2020 1119   GFRAA 73 02/18/2020 1119   Lab Results  Component Value Date   CHOL 162 03/25/2020   HDL 48 03/25/2020   LDLCALC 94 03/25/2020   TRIG 109 03/25/2020   CHOLHDL 3.4 03/25/2020   Lab Results  Component Value Date   HGBA1C 10.1 (H) 07/10/2019   No results found for: VITAMINB12 Lab Results  Component Value Date   TSH 1.310 07/10/2019       ASSESSMENT AND PLAN 70 y.o. year old female  has a past medical history of Anemia, Arthritis, Blood transfusion, CAD (coronary artery disease), native coronary artery, Cervical spondylosis with myelopathy and radiculopathy, CVA (cerebral vascular accident) ([place]), Depression, Diabetes mellitus, Diastolic dysfunction, Diverticulitis, DJD (degenerative joint disease), DVT (deep venous thrombosis) ([place]), Endometriosis, Family history of adverse reaction to anesthesia,  Fibromyalgia, GERD (gastroesophageal reflux disease), Hiatal hernia, History of kidney stones, Hyperlipidemia, Hypertension, Hypothyroidism, Insomnia, Mild aortic insufficiency, Osteoarthritis, Ovarian cyst, PONV (postoperative nausea and vomiting), RLS (restless legs syndrome), Thoracic compression fracture ([place]), Uterine fibroid, UTI (lower urinary tract infection), Villous adenoma of right colon (04/08/2016), and Vitamin D deficiency disease. here with     ICD-10-CM   1. Chronic migraine without aura without status migrainosus, not intractable  G43.709        Madlyn is doing well, today. She is tolerating imipramine with no obvious adverse effects. Headaches have reduced in frequency and intensity. She will continue 50mg  at bedtime. Healthy lifestyle habits advised.  I have encouraged her to continue close follow up with PCP, GI, pain management and endocrinology. Healthy lifestyle habits discussed. She will follow up with me in 1 year, sooner if needed.    No orders of the defined types were placed in this encounter.    Meds ordered this encounter  Medications   imipramine (TOFRANIL) 50 MG tablet    Sig: Take 1 tablet (50 mg total) by mouth daily.    Dispense:  90 tablet    Refill:  3    Order Specific Question:   Supervising Provider    Answer:   Melvenia Beam [[id number]]      YYQ MGNOI, FNP-C 01/14/2021, 1:20 PM [facility] [address], [place], [place] [address] [phone]

## 2021-02-15 ENCOUNTER — Other Ambulatory Visit: Payer: Self-pay | Admitting: Family Medicine

## 2021-02-15 DIAGNOSIS — M810 Age-related osteoporosis without current pathological fracture: Secondary | ICD-10-CM

## 2021-02-16 ENCOUNTER — Other Ambulatory Visit: Payer: Self-pay | Admitting: Family Medicine

## 2021-02-16 DIAGNOSIS — Z1231 Encounter for screening mammogram for malignant neoplasm of breast: Secondary | ICD-10-CM

## 2021-04-14 ENCOUNTER — Other Ambulatory Visit: Payer: Self-pay

## 2021-04-14 ENCOUNTER — Ambulatory Visit
Admission: RE | Admit: 2021-04-14 | Discharge: 2021-04-14 | Disposition: A | Discharge status: Home / Self Care | Payer: Medicare Other | Source: Ambulatory Visit | Attending: Family Medicine | Admitting: Family Medicine

## 2021-04-14 DIAGNOSIS — Z1231 Encounter for screening mammogram for malignant neoplasm of breast: Secondary | ICD-10-CM

## 2021-05-25 ENCOUNTER — Ambulatory Visit (INDEPENDENT_AMBULATORY_CARE_PROVIDER_SITE_OTHER): Payer: Medicare Other | Admitting: Cardiology

## 2021-05-25 ENCOUNTER — Other Ambulatory Visit: Payer: Self-pay

## 2021-05-25 ENCOUNTER — Encounter: Payer: Self-pay | Admitting: Cardiology

## 2021-05-25 DIAGNOSIS — E7800 Pure hypercholesterolemia, unspecified: Secondary | ICD-10-CM

## 2021-05-25 DIAGNOSIS — N1831 Chronic kidney disease, stage 3a: Secondary | ICD-10-CM

## 2021-05-25 DIAGNOSIS — I2510 Atherosclerotic heart disease of native coronary artery without angina pectoris: Secondary | ICD-10-CM

## 2021-05-25 DIAGNOSIS — I10 Essential (primary) hypertension: Secondary | ICD-10-CM | POA: Diagnosis not present

## 2021-05-25 DIAGNOSIS — E785 Hyperlipidemia, unspecified: Secondary | ICD-10-CM

## 2021-05-25 DIAGNOSIS — I491 Atrial premature depolarization: Secondary | ICD-10-CM

## 2021-05-25 MED ORDER — FUROSEMIDE 20 MG PO TABS
20.0000 mg | ORAL_TABLET | Freq: Every day | ORAL | 3 refills | Status: DC | PRN
Start: 2021-05-25 — End: 2021-09-21

## 2021-05-25 MED ORDER — AMLODIPINE BESYLATE 2.5 MG PO TABS: 5.0000 mg | ORAL_TABLET | Freq: Every day | ORAL | 3 refills | Status: AC

## 2021-05-25 MED ORDER — IRBESARTAN 300 MG PO TABS: 300.0000 mg | ORAL_TABLET | Freq: Every day | ORAL | 3 refills | Status: DC

## 2021-05-25 MED ORDER — METOPROLOL SUCCINATE ER 25 MG PO TB24: 25.0000 mg | ORAL_TABLET | Freq: Every day | ORAL | 3 refills | Status: DC

## 2021-05-25 MED ORDER — ROSUVASTATIN CALCIUM 5 MG PO TABS: 5.0000 mg | ORAL_TABLET | Freq: Every day | ORAL | 3 refills | Status: DC

## 2021-05-25 NOTE — Patient Instructions (Signed)

## 2021-05-25 NOTE — Progress Notes (Signed)
Cardiology Office Note:    Date:  05/25/2021   ID:  [name], DOB 1951/01/25, MRN [id number]  PCP:  Harlan Stains, MD  Cardiologist:  Fransico Him, MD    Referring MD: Harlan Stains, MD   Chief Complaint  Patient presents with   Coronary Artery Disease   Hypertension   Hyperlipidemia     History of Present Illness:    [name] is a 70 y.o. female with a hx of  DM2, hypothyroidism, HTN, depression, GERD, CKD stage 3 and fibromyalgia and PACs on that EKG. 2D echo showed normal LVF in 09/2017.  She has a hx of CVA and TEE showed normal LVF with mild MR and AR .  Event monitor was done showing PACs.   She was seen in the ER on 12/02/2019 with chest pain while driving her car.  She underwent coronary CTA showing coronary CTA showed a coronary Ca score of 506, mild CAD with 25-49% mRCA and moderate CAD with 50-69% sequential mRCA, 25-49% inferior sub branch of D1 and 25-49% in pLCx.  CT FFR flow analysis demonstrates no hemodynamically significant flow limiting lesions.   She has been on medical management.   She is here today for followup and is doing well.  She has chronic DOE related to obesity and has gained about 18lbs in the past few months due to sedentary state from a hip and back problem. She denies any chest pain or pressure, PND, orthopnea, LE edema, palpitations or syncope.  Occasionally she will have some dizziness if she bends over or going from sitting to standing. She is compliant with her meds and is tolerating meds with no SE.     Past Medical History:  Diagnosis Date   Anemia    iron deficiency   Arthritis    Blood transfusion    CAD (coronary artery disease), native coronary artery    coronary CTA showed a coronary Ca score of 506, mild CAD with 25-49% mRCA and moderate CAD with 50-69% sequential mRCA, 25-49% inferior sub branch of D1 and 25-49% in pLCx.     Cervical spondylosis with myelopathy and radiculopathy    CVA (cerebral vascular accident) ([place])     11/2017   Depression    Diabetes mellitus    Type II   Diastolic dysfunction    Diverticulitis    DJD (degenerative joint disease)    DVT (deep venous thrombosis) (HCC)    times 2 lower leg   Endometriosis    Family history of adverse reaction to anesthesia    mom and dad PONV   Fibromyalgia    GERD (gastroesophageal reflux disease)    occ   Hiatal hernia    History of kidney stones    Hyperlipidemia    Hypertension    Hypothyroidism    Insomnia    Mild aortic insufficiency    by echo 04/2013   Osteoarthritis    back and knee   Ovarian cyst    PONV (postoperative nausea and vomiting)    RLS (restless legs syndrome)    Thoracic compression fracture (HCC)    Uterine fibroid    UTI (lower urinary tract infection)    Villous adenoma of right colon 04/08/2016   Vitamin D deficiency disease     Past Surgical History:  Procedure Laterality Date   ANTERIOR CERVICAL DECOMP/DISCECTOMY FUSION N/A 03/23/2017   Procedure: ANTERIOR CERVICAL DECOMPRESSION/DISCECTOMY FUSION, INTERBODY PROSTHESIS,PLATE CERVICAL THREE- CERVICAL FOUR, CERVICAL FOUR- CERVICAL FIVE, CERVICAL FIVE- CERVICAL SIX;  Surgeon: Newman Pies, MD;  Location: [place];  Service: Neurosurgery;  Laterality: N/A;   ANTERIOR CERVICAL DECOMP/DISCECTOMY FUSION N/A 04/18/2019   Procedure: ANTERIOR CERVICAL DECOMPRESSION/DISCECTOMY  CERVICAL SIX- CERVICAL SEVEN;  Surgeon: Newman Pies, MD;  Location: [place];  Service: Neurosurgery;  Laterality: N/A;  anterior   APPENDECTOMY     BACK SURGERY  2005   April  2013 - spinal fusion[facility]   bowel blockage surgery     BREAST SURGERY     breast reduction   CARDIAC CATHETERIZATION  04/2013   normal coronary arteries and normal LVF   CARPAL TUNNEL RELEASE  06/05/2012   Procedure: CARPAL TUNNEL RELEASE;  Surgeon: Cammie Sickle., MD;  Location: [place];  Service: Orthopedics;  Laterality: Left;   CESAREAN SECTION     x 2   CHOLECYSTECTOMY     COLECTOMY   04/08/2016   DILATION AND CURETTAGE OF UTERUS     DORSAL COMPARTMENT RELEASE  06/05/2012   Procedure: RELEASE DORSAL COMPARTMENT (DEQUERVAIN);  Surgeon: Cammie Sickle., MD;  Location: [facility];  Service: Orthopedics;  Laterality: Left;  Excision of mixoid cyst also   EYE SURGERY     cataracts bilateral   JOINT REPLACEMENT     Bilateral knee   KNEE ARTHROPLASTY  2009   lft partial   KNEE ARTHROPLASTY     rt   LAPAROSCOPIC PARTIAL COLECTOMY N/A 04/08/2016   Procedure: LAPAROSCOPIC ASSISTED ASCENDING COLECTOMY POSSIBLE OPEN COLECTOMY;  Surgeon: Fanny Skates, MD;  Location: [place];  Service: General;  Laterality: N/A;   orthopedic surgeries     multiple   perforated bowl     Sep 09 2020   REDUCTION MAMMAPLASTY Bilateral    SHOULDER OPEN ROTATOR CUFF REPAIR     rt and lft   TEE WITHOUT CARDIOVERSION N/A 11/23/2017   Procedure: TRANSESOPHAGEAL ECHOCARDIOGRAM (TEE);  Surgeon: Jerline Pain, MD;  Location: [facility] ENDOSCOPY;  Service: Cardiovascular;  Laterality: N/A;   TUBAL LIGATION     btsp    Current Medications: Current Meds  Medication Sig   acetaminophen (TYLENOL) 500 MG tablet Take 1,000 mg by mouth every 8 (eight) hours as needed for mild pain or headache.   amLODipine (NORVASC) 2.5 MG tablet Take 5 mg by mouth daily.    aspirin EC 81 MG tablet Take 81 mg by mouth daily.   Cholecalciferol (VITAMIN D3) 10 MCG (400 UNIT) CHEW Chew by mouth. OTC   Cyanocobalamin (B-12) 2500 MCG TABS Take 2,500 mcg by mouth daily.   cyclobenzaprine (FLEXERIL) 10 MG tablet Take 1 tablet (10 mg total) by mouth 3 (three) times daily as needed for muscle spasms.   dexlansoprazole (DEXILANT) 60 MG capsule Take 60 mg by mouth daily.   docusate sodium (COLACE) 100 MG capsule Take 1 capsule (100 mg total) by mouth 2 (two) times daily.   DULoxetine (CYMBALTA) 60 MG capsule Take 60 mg by mouth daily.    ferrous sulfate 325 (65 FE) MG tablet Take 1 tablet (325 mg total) by mouth 2 (two)  times daily with a meal.   fluticasone (FLONASE) 50 MCG/ACT nasal spray Place 2 sprays into both nostrils daily as needed for allergies or rhinitis.   furosemide (LASIX) 20 MG tablet Take 20 mg by mouth daily as needed for fluid or edema.    gabapentin (NEURONTIN) 300 MG capsule Take 600 mg by mouth at bedtime.    ibandronate (BONIVA) 150 MG tablet Take 150 mg  by mouth every 30 (thirty) days.   imipramine (TOFRANIL) 50 MG tablet Take 1 tablet (50 mg total) by mouth daily.   insulin glargine (LANTUS) 100 UNIT/ML injection Inject 44 Units into the skin daily. Takes in am   insulin lispro (HUMALOG) 100 UNIT/ML KwikPen Inject 20 Units into the skin 3 (three) times daily.   irbesartan (AVAPRO) 300 MG tablet Take 300 mg by mouth daily.    levothyroxine (SYNTHROID, LEVOTHROID) 150 MCG tablet Take 150 mcg by mouth daily before breakfast.   LORazepam (ATIVAN) 0.5 MG tablet TAKE 1 TABLET AS NEEDED FOR ANXIETY EVERY 8 HRS   Magnesium 100 MG CAPS Take by mouth. OTC   meclizine (ANTIVERT) 12.5 MG tablet Take 25 mg by mouth 3 (three) times daily as needed for dizziness.   metoprolol succinate (TOPROL-XL) 25 MG 24 hr tablet Take 1 tablet (25 mg total) by mouth daily. PATIENT NEED TO SCHEDULE  AN APPOINTMENT TO RECEIVE FUTURE REFILLS.   ondansetron (ZOFRAN ODT) 4 MG disintegrating tablet Take 1 tablet (4 mg total) by mouth every 8 (eight) hours as needed for nausea or vomiting.   pantoprazole (PROTONIX) 40 MG tablet Take 40 mg by mouth daily.   Probiotic Product (PROBIOTIC PO) Take 1 capsule by mouth daily.   promethazine (PHENERGAN) 25 MG tablet Take 25 mg by mouth every 8 (eight) hours as needed for nausea or vomiting.   rosuvastatin (CRESTOR) 5 MG tablet TAKE 1 TABLET BY MOUTH EVERY DAY   temazepam (RESTORIL) 15 MG capsule Take 15 mg by mouth at bedtime.      Allergies:   Dilaudid [hydromorphone hcl], Heparin, Ambien [zolpidem tartrate], Codeine, Morphine and related, Penicillins, Statins, Suvorexant,  Amitriptyline, Ceftin [cefuroxime axetil], Lovaza [omega-3-acid ethyl esters], Lunesta [eszopiclone], Lyrica [pregabalin], and Metformin and related   Social History   Socioeconomic History   Marital status: Married    Spouse name: Not on file   Number of children: 2   Years of education: trade school   Highest education level: Not on file  Occupational History   Not on file  Tobacco Use   Smoking status: Never   Smokeless tobacco: Never  Vaping Use   Vaping Use: Never used  Substance and Sexual Activity   Alcohol use: Never   Drug use: Never   Sexual activity: Yes    Birth control/protection: Post-menopausal, Surgical  Other Topics Concern   Not on file  Social History Narrative   Lives at home with her husband   Right handed   Caffeine: 2-3 cups of coffee/day   Social Determinants of Health   Financial Resource Strain: Not on file  Food Insecurity: Not on file  Transportation Needs: Not on file  Physical Activity: Not on file  Stress: Not on file  Social Connections: Not on file     Family History: The patient's family history includes Breast cancer in her cousin, cousin, maternal aunt, and maternal aunt; Breast cancer (age of onset: 3) in her mother; Dementia in her mother; Diabetes in her brother; Diabetes type II in her father and mother; Heart attack in her brother and father; High blood pressure in an other family member; Hypertension in her mother; Hypothyroidism in her brother and father; Kidney failure in her father; Lung cancer in her brother.  ROS:   Please see the history of present illness.    ROS  All other systems reviewed and negative.   EKGs/Labs/Other Studies Reviewed:    The following studies were reviewed today:  Coronary  CTA 01/2020  Aorta:  Normal size.  Scattered calcifications.  No dissection.   Aortic Valve:  Trileaflet.  No calcifications.   Mitral Valve: Marked posterior mitral annular calcifications.   Coronary Arteries:  Normal  coronary origin.  Right dominance.   RCA is a large dominant artery that gives rise to PDA and PLVB. There is mild calcified plaque in the mid RCA with associated stenosis of 25-49% and sequential mid RCA lesion with moderate mixed plaque with positive remodeling with associated stenosis of 50-69%.   Left main is a large artery that gives rise to LAD and LCX arteries. There is no plaque.   LAD is a large vessel that gives rise to a large branching D1. There is minimal calcified plaque in the proximal LAD with associated stenosis of 0-24%. There is mild calcified plaque in the inferior sub branch of the Diagonal with associated stenosis of 25-49%.   LCX is a non-dominant artery that gives rise to one large OM1 branch. There is mild mixed plaque with positive remodeling in the proximal LCx with associated stenosis of 25-49%.   Other findings:   Normal pulmonary vein drainage into the left atrium.   Normal let atrial appendage without a thrombus.   Normal size of the pulmonary artery.   IMPRESSION: 1. Coronary calcium score of 506. This was 94th percentile for age and sex matched control.   2.  Normal coronary origin with right dominance.   3.  Moderate atherosclerosis.  CAD-RADS=3.   4. Consider symptom-guided anti-ischemic and preventive pharmacotherapy as well as risk factor modification per guideline-directed care.   5.  Consider functional ischemia assessment.   6.  This study has been submitted for FFR analysis.    EKG:  EKG is  ordered today and showed NSR with anterior infarct  Recent Labs: No results found for requested labs within last 8760 hours.   Recent Lipid Panel    Component Value Date/Time   CHOL 162 03/25/2020 1038   TRIG 109 03/25/2020 1038   HDL 48 03/25/2020 1038   CHOLHDL 3.4 03/25/2020 1038   CHOLHDL 3.7 11/22/2017 0800   VLDL 32 11/22/2017 0800   LDLCALC 94 03/25/2020 1038    Physical Exam:    VS:  BP 126/72   Pulse 95   Ht 5\' 1"   (1.549 m)   Wt 221 lb 9.6 oz (100.5 kg)   SpO2 95%   BMI 41.87 kg/m     Wt Readings from Last 3 Encounters:  05/25/21 221 lb 9.6 oz (100.5 kg)  01/14/21 217 lb (98.4 kg)  07/16/20 220 lb (99.8 kg)    GEN: Well nourished, well developed in no acute distress HEENT: Normal NECK: No JVD; No carotid bruits LYMPHATICS: No lymphadenopathy CARDIAC:RRR, no murmurs, rubs, gallops RESPIRATORY:  Clear to auscultation without rales, wheezing or rhonchi  ABDOMEN: Soft, non-tender, non-distended MUSCULOSKELETAL:  No edema; No deformity  SKIN: Warm and dry NEUROLOGIC:  Alert and oriented x 3 PSYCHIATRIC:  Normal affect   ASSESSMENT:    1. Coronary artery disease involving native coronary artery of native heart without angina pectoris   2. Primary hypertension   3. Stage 3a chronic kidney disease ([place])   4. Pure hypercholesterolemia   5. PAC (premature atrial contraction)    PLAN:    In order of problems listed above:  1.  ASCAD -atypical chest pain -coronary CTA showed a coronary Ca score of 506, mild CAD with 25-49% mRCA and moderate CAD with 50-69% sequential mRCA,  25-49% inferior sub branch of D1 and 25-49% in pLCx.   -FFR flow analysis showed no flow limiting lesions -She denies any chest pain -continue ASA 81mg  daily and statin.    2.  HTN -BP is well controlled on exam today -Prescription drug management with amlodipine 5 mg daily, Toprol-XL 25 mg daily and irbesartan 300 mg daily -I have personally reviewed and interpreted outside labs performed by patient's PCP which showed serum creatinine 1.08 and potassium 4.7 and TSH 2.29 in July and August 2022  3.  DM with URK2H -followed by PCP  4.  HLD -LDL goal < 70 -I have personally reviewed and interpreted outside labs performed by patient's PCP which showed LDL 69, HDL 45, triglycerides 124, ALT 14 in July 2022 -Continue prescription drug management with Crestor 5 mg daily > refilled today   5.  PACs -controlled on  Toprol   Medication Adjustments/Labs and Tests Ordered: Current medicines are reviewed at length with the patient today.  Concerns regarding medicines are outlined above.  Orders Placed This Encounter  Procedures   EKG 12-Lead    No orders of the defined types were placed in this encounter.   Signed, Fransico Him, MD  05/25/2021 11:44 AM    [place]

## 2021-05-25 NOTE — Addendum Note (Signed)
Addended by: Antonieta Iba on: 05/25/2021 11:55 AM   Modules accepted: Orders

## 2021-07-05 ENCOUNTER — Encounter (HOSPITAL_BASED_OUTPATIENT_CLINIC_OR_DEPARTMENT_OTHER): Payer: Self-pay | Admitting: Obstetrics and Gynecology

## 2021-07-05 ENCOUNTER — Emergency Department (HOSPITAL_BASED_OUTPATIENT_CLINIC_OR_DEPARTMENT_OTHER): Payer: Medicare Other | Admitting: Radiology

## 2021-07-05 ENCOUNTER — Emergency Department (HOSPITAL_BASED_OUTPATIENT_CLINIC_OR_DEPARTMENT_OTHER): Payer: Medicare Other

## 2021-07-05 ENCOUNTER — Emergency Department (HOSPITAL_BASED_OUTPATIENT_CLINIC_OR_DEPARTMENT_OTHER)
Admission: EM | Admit: 2021-07-05 | Discharge: 2021-07-05 | Disposition: A | Discharge status: Home / Self Care | Payer: Medicare Other | Attending: Emergency Medicine | Admitting: Emergency Medicine

## 2021-07-05 ENCOUNTER — Other Ambulatory Visit: Payer: Self-pay

## 2021-07-05 DIAGNOSIS — Z9013 Acquired absence of bilateral breasts and nipples: Secondary | ICD-10-CM | POA: Insufficient documentation

## 2021-07-05 DIAGNOSIS — E119 Type 2 diabetes mellitus without complications: Secondary | ICD-10-CM | POA: Insufficient documentation

## 2021-07-05 DIAGNOSIS — Z7952 Long term (current) use of systemic steroids: Secondary | ICD-10-CM | POA: Diagnosis not present

## 2021-07-05 DIAGNOSIS — Z79899 Other long term (current) drug therapy: Secondary | ICD-10-CM | POA: Diagnosis not present

## 2021-07-05 DIAGNOSIS — R0789 Other chest pain: Secondary | ICD-10-CM | POA: Diagnosis not present

## 2021-07-05 DIAGNOSIS — I5030 Unspecified diastolic (congestive) heart failure: Secondary | ICD-10-CM | POA: Insufficient documentation

## 2021-07-05 DIAGNOSIS — Z794 Long term (current) use of insulin: Secondary | ICD-10-CM | POA: Insufficient documentation

## 2021-07-05 DIAGNOSIS — Z7982 Long term (current) use of aspirin: Secondary | ICD-10-CM | POA: Diagnosis not present

## 2021-07-05 DIAGNOSIS — Z96653 Presence of artificial knee joint, bilateral: Secondary | ICD-10-CM | POA: Diagnosis not present

## 2021-07-05 DIAGNOSIS — E039 Hypothyroidism, unspecified: Secondary | ICD-10-CM | POA: Diagnosis not present

## 2021-07-05 DIAGNOSIS — I110 Hypertensive heart disease with heart failure: Secondary | ICD-10-CM | POA: Insufficient documentation

## 2021-07-05 DIAGNOSIS — Z20822 Contact with and (suspected) exposure to covid-19: Secondary | ICD-10-CM | POA: Diagnosis not present

## 2021-07-05 DIAGNOSIS — I2510 Atherosclerotic heart disease of native coronary artery without angina pectoris: Secondary | ICD-10-CM | POA: Insufficient documentation

## 2021-07-05 DIAGNOSIS — R079 Chest pain, unspecified: Secondary | ICD-10-CM

## 2021-07-05 LAB — CBC
HCT: 43.5 % (ref 36.0–46.0)
Hemoglobin: 14.2 g/dL (ref 12.0–15.0)
MCH: 27.3 pg (ref 26.0–34.0)
MCHC: 32.6 g/dL (ref 30.0–36.0)
MCV: 83.7 fL (ref 80.0–100.0)
Platelets: 262 10*3/uL (ref 150–400)
RBC: 5.2 MIL/uL — ABNORMAL HIGH (ref 3.87–5.11)
RDW: 13.2 % (ref 11.5–15.5)
WBC: 11.8 10*3/uL — ABNORMAL HIGH (ref 4.0–10.5)
nRBC: 0 % (ref 0.0–0.2)

## 2021-07-05 LAB — TROPONIN I (HIGH SENSITIVITY)
Troponin I (High Sensitivity): 5 ng/L (ref <18)
Troponin I (High Sensitivity): 5 ng/L (ref <18)

## 2021-07-05 LAB — BASIC METABOLIC PANEL
Anion gap: 10 (ref 5–15)
BUN: 15 mg/dL (ref 8–23)
CO2: 29 mmol/L (ref 22–32)
Calcium: 9.9 mg/dL (ref 8.9–10.3)
Chloride: 101 mmol/L (ref 98–111)
Creatinine, Ser: 0.99 mg/dL (ref 0.44–1.00)
GFR, Estimated: >60 mL/min (ref >60)
Glucose, Bld: 118 mg/dL — ABNORMAL HIGH (ref 70–99)
Potassium: 3.6 mmol/L (ref 3.5–5.1)
Sodium: 140 mmol/L (ref 135–145)

## 2021-07-05 LAB — RESP PANEL BY RT-PCR (FLU A&B, COVID) ARPGX2
Influenza A by PCR: NEGATIVE
Influenza B by PCR: NEGATIVE
SARS Coronavirus 2 by RT PCR: NEGATIVE

## 2021-07-05 MED ORDER — IOHEXOL 350 MG/ML SOLN
80.0000 mL | Freq: Once | INTRAVENOUS | Status: AC | PRN
  Administered 2021-07-05: 15:00:00 80 mL via INTRAVENOUS

## 2021-07-05 MED ORDER — NITROGLYCERIN 0.4 MG SL SUBL
0.4000 mg | SUBLINGUAL_TABLET | SUBLINGUAL | Status: DC | PRN
  Administered 2021-07-05: 13:00:00 0.4 mg via SUBLINGUAL
  Filled 2021-07-05: qty 1

## 2021-07-05 MED ORDER — FENTANYL CITRATE PF 50 MCG/ML IJ SOSY
50.0000 ug | PREFILLED_SYRINGE | Freq: Once | INTRAMUSCULAR | Status: AC
  Administered 2021-07-05: 14:00:00 50 ug via INTRAVENOUS
  Filled 2021-07-05: qty 1

## 2021-07-05 NOTE — ED Provider Notes (Signed)
3:13 PM Care transferred from Dr. Ronnald Nian.  At time of transfer care, patient is awaiting for results of CT PE study to rule out PE.  If work-up is reassuring, plan of care to discharge for close cardiology follow-up.  Previous team reportedly spoke to cardiology who felt that was reasonable plan.  4:47 PM CT scan shows no pulmonary embolism.  Had a discussion with patient and she feels comfortable following up with her PCP and cardiologist and discharge.  Patient was offered medication to help with symptoms but she would rather call her team and discuss outpatient medication management with them.  She had no other questions or concerns and was discharged in good condition.   Clinical Impression: 1. Chest pain, unspecified type     Disposition: Discharge  Condition: Good  I have discussed the results, Dx and Tx plan with the pt(& family if present). He/she/they expressed understanding and agree(s) with the plan. Discharge instructions discussed at great length. Strict return precautions discussed and pt &/or family have verbalized understanding of the instructions. No further questions at time of discharge.    New Prescriptions   No medications on file    Follow Up: Harlan Stains, MD [address] [place] [address] [phone]     [facility] [place] [address] [facility] [place] [address] [phone]        Brittnye Josephs, Gwenyth Allegra, MD 07/05/21 [phone]

## 2021-07-05 NOTE — ED Triage Notes (Signed)
Patient reports to the ER for chest pain with right arm pain. Patient reports she started feeling the pain at 10:15 and it has increased since. Denies nausea and emesis.

## 2021-07-05 NOTE — ED Notes (Signed)
Patient's son had already given patient aspirin before she came

## 2021-07-05 NOTE — ED Provider Notes (Signed)
[facility] EMERGENCY DEPT Provider Note   CSN: [id number] Arrival date & time: 07/05/21  1141     History Chief Complaint  Patient presents with   Chest Pain    [name] is a 70 y.o. female.  The history is provided by the patient.  Chest Pain Pain location:  L chest Pain quality: aching   Pain radiates to:  L arm Pain severity:  Mild Onset quality:  Sudden Timing:  Intermittent Progression:  Waxing and waning Chronicity:  New Context: at rest   Relieved by:  Nothing Worsened by:  Nothing Associated symptoms: no abdominal pain, no back pain, no cough, no fever, no palpitations, no shortness of breath and no vomiting   Risk factors: coronary artery disease, high cholesterol, hypertension and prior DVT/PE       Past Medical History:  Diagnosis Date   Anemia    iron deficiency   Arthritis    Blood transfusion    CAD (coronary artery disease), native coronary artery    coronary CTA showed a coronary Ca score of 506, mild CAD with 25-49% mRCA and moderate CAD with 50-69% sequential mRCA, 25-49% inferior sub branch of D1 and 25-49% in pLCx.     Cervical spondylosis with myelopathy and radiculopathy    CVA (cerebral vascular accident) ([place])    11/2017   Depression    Diabetes mellitus    Type II   Diastolic dysfunction    Diverticulitis    DJD (degenerative joint disease)    DVT (deep venous thrombosis) (HCC)    times 2 lower leg   Endometriosis    Family history of adverse reaction to anesthesia    mom and dad PONV   Fibromyalgia    GERD (gastroesophageal reflux disease)    occ   Hiatal hernia    History of kidney stones    Hyperlipidemia    Hypertension    Hypothyroidism    Insomnia    Mild aortic insufficiency    by echo 04/2013   Osteoarthritis    back and knee   Ovarian cyst    PONV (postoperative nausea and vomiting)    RLS (restless legs syndrome)    Thoracic compression fracture (HCC)    Uterine fibroid    UTI (lower  urinary tract infection)    Villous adenoma of right colon 04/08/2016   Vitamin D deficiency disease     Patient Active Problem List   Diagnosis Date Noted   CAD (coronary artery disease), native coronary artery    Diabetic hyperosmolar non-ketotic state ([place]) 05/15/2018   Hyperlipidemia 05/15/2018   GERD (gastroesophageal reflux disease) 05/15/2018   Depression with anxiety 05/15/2018   Cerebral thrombosis with cerebral infarction 11/22/2017   Acute encephalopathy    DKA (diabetic ketoacidoses) 11/18/2017   Diabetic ketoacidosis ([place]) 11/18/2017   Acute respiratory failure with hypoxia (HCC)    Acute renal failure ([place])    Endotracheal tube present    Septic shock ([place])    Spondylolisthesis of lumbar region 11/15/2017   PAC (premature atrial contraction) 09/04/2017   Cervical spondylosis with radiculopathy 03/23/2017   Hypoxia    Villous adenoma of right colon 04/08/2016   Leukocytosis 04/08/2016   Anemia    Diastolic dysfunction    Mild aortic insufficiency    Other nonspecific abnormal cardiovascular system function study 05/03/2013   SOB (shortness of breath) 05/01/2013   Atypical chest pain 02/28/2013   Dysphagia 02/28/2013   UTI (lower urinary tract infection) 07/06/2012  Intractable nausea and vomiting 07/06/2012   Blood loss anemia 07/06/2012   Epigastric abdominal pain 06/27/2012   Type II diabetes mellitus ([place]) 07/28/2011   Hypothyroidism 07/28/2011   Hypertension 07/28/2011   Hepatic steatosis 07/28/2011    Past Surgical History:  Procedure Laterality Date   ANTERIOR CERVICAL DECOMP/DISCECTOMY FUSION N/A 03/23/2017   Procedure: ANTERIOR CERVICAL DECOMPRESSION/DISCECTOMY FUSION, INTERBODY PROSTHESIS,PLATE CERVICAL THREE- CERVICAL FOUR, CERVICAL FOUR- CERVICAL FIVE, CERVICAL FIVE- CERVICAL SIX;  Surgeon: Newman Pies, MD;  Location: [facility];  Service: Neurosurgery;  Laterality: N/A;   ANTERIOR CERVICAL DECOMP/DISCECTOMY FUSION N/A 04/18/2019   Procedure:  ANTERIOR CERVICAL DECOMPRESSION/DISCECTOMY  CERVICAL SIX- CERVICAL SEVEN;  Surgeon: Newman Pies, MD;  Location: [place] [facility];  Service: Neurosurgery;  Laterality: N/A;  anterior   APPENDECTOMY     BACK SURGERY  2005   April  2013 - spinal fusion@ [facility]   bowel blockage surgery     BREAST SURGERY     breast reduction   CARDIAC CATHETERIZATION  04/2013   normal coronary arteries and normal LVF   CARPAL TUNNEL RELEASE  06/05/2012   Procedure: CARPAL TUNNEL RELEASE;  Surgeon: Cammie Sickle., MD;  Location: [facility];  Service: Orthopedics;  Laterality: Left;   CESAREAN SECTION     x 2   CHOLECYSTECTOMY     COLECTOMY  04/08/2016   DILATION AND CURETTAGE OF UTERUS     DORSAL COMPARTMENT RELEASE  06/05/2012   Procedure: RELEASE DORSAL COMPARTMENT (DEQUERVAIN);  Surgeon: Cammie Sickle., MD;  Location: [facility];  Service: Orthopedics;  Laterality: Left;  Excision of mixoid cyst also   EYE SURGERY     cataracts bilateral   JOINT REPLACEMENT     Bilateral knee   KNEE ARTHROPLASTY  2009   lft partial   KNEE ARTHROPLASTY     rt   LAPAROSCOPIC PARTIAL COLECTOMY N/A 04/08/2016   Procedure: LAPAROSCOPIC ASSISTED ASCENDING COLECTOMY POSSIBLE OPEN COLECTOMY;  Surgeon: Fanny Skates, MD;  Location: [place] [facility];  Service: General;  Laterality: N/A;   orthopedic surgeries     multiple   perforated bowl     Sep 09 2020   REDUCTION MAMMAPLASTY Bilateral    SHOULDER OPEN ROTATOR CUFF REPAIR     rt and lft   TEE WITHOUT CARDIOVERSION N/A 11/23/2017   Procedure: TRANSESOPHAGEAL ECHOCARDIOGRAM (TEE);  Surgeon: Jerline Pain, MD;  Location: [facility] ENDOSCOPY;  Service: Cardiovascular;  Laterality: N/A;   TUBAL LIGATION     btsp     OB History     Gravida  2   Para  2   Term  2   Preterm      AB      Living  2      SAB      IAB      Ectopic      Multiple      Live Births              Family History  Problem Relation Age of Onset   Heart  attack Father        89s   Hypothyroidism Father    Diabetes type II Father    Kidney failure Father    Diabetes type II Mother    Hypertension Mother    Breast cancer Mother 58   Dementia Mother    Hypothyroidism Brother    Diabetes Brother    Breast cancer Maternal Aunt    Breast cancer Cousin  Breast cancer Maternal Aunt    Breast cancer Cousin    High blood pressure Other        "all"   Heart attack Brother    Lung cancer Brother     Social History   Tobacco Use   Smoking status: Never   Smokeless tobacco: Never  Vaping Use   Vaping Use: Never used  Substance Use Topics   Alcohol use: Never   Drug use: Never    Home Medications Prior to Admission medications   Medication Sig Start Date End Date Taking? Authorizing Provider  acetaminophen (TYLENOL) 500 MG tablet Take 1,000 mg by mouth every 8 (eight) hours as needed for mild pain or headache.    [provider]  amLODipine (NORVASC) 2.5 MG tablet Take 2 tablets (5 mg total) by mouth daily. 05/25/21   Sueanne Margarita, MD  aspirin EC 81 MG tablet Take 81 mg by mouth daily.    [provider]  Cholecalciferol (VITAMIN D3) 10 MCG (400 UNIT) CHEW Chew by mouth. OTC    [provider]  Cyanocobalamin (B-12) 2500 MCG TABS Take 2,500 mcg by mouth daily.    [provider]  cyclobenzaprine (FLEXERIL) 10 MG tablet Take 1 tablet (10 mg total) by mouth 3 (three) times daily as needed for muscle spasms. 04/19/19   Viona Gilmore D, NP  dexlansoprazole (DEXILANT) 60 MG capsule Take 60 mg by mouth daily.    [provider]  docusate sodium (COLACE) 100 MG capsule Take 1 capsule (100 mg total) by mouth 2 (two) times daily. 11/17/17   Newman Pies, MD  DULoxetine (CYMBALTA) 60 MG capsule Take 60 mg by mouth daily.     [provider]  ferrous sulfate 325 (65 FE) MG tablet Take 1 tablet (325 mg total) by mouth 2 (two) times daily with a meal. 11/17/17   Newman Pies, MD   fluticasone [facility]) 50 MCG/ACT nasal spray Place 2 sprays into both nostrils daily as needed for allergies or rhinitis.    [provider]  furosemide (LASIX) 20 MG tablet Take 1 tablet (20 mg total) by mouth daily as needed for fluid or edema. 05/25/21   Sueanne Margarita, MD  gabapentin (NEURONTIN) 300 MG capsule Take 600 mg by mouth at bedtime.  03/30/18   [provider]  ibandronate (BONIVA) 150 MG tablet Take 150 mg by mouth every 30 (thirty) days. 03/06/19   [provider]  imipramine (TOFRANIL) 50 MG tablet Take 1 tablet (50 mg total) by mouth daily. 01/14/21   Lomax, Amy, NP  insulin glargine (LANTUS) 100 UNIT/ML injection Inject 44 Units into the skin daily. Takes in am    [provider]  insulin lispro (HUMALOG) 100 UNIT/ML KwikPen Inject 20 Units into the skin 3 (three) times daily.    [provider]  irbesartan (AVAPRO) 300 MG tablet Take 1 tablet (300 mg total) by mouth daily. 05/25/21   Sueanne Margarita, MD  levothyroxine (SYNTHROID, LEVOTHROID) 150 MCG tablet Take 150 mcg by mouth daily before breakfast. 10/10/17   [provider]  LORazepam (ATIVAN) 0.5 MG tablet TAKE 1 TABLET AS NEEDED FOR ANXIETY EVERY 8 HRS 03/15/19   [provider]  Magnesium 100 MG CAPS Take by mouth. OTC    [provider]  meclizine (ANTIVERT) 12.5 MG tablet Take 25 mg by mouth 3 (three) times daily as needed for dizziness.    [provider]  metoprolol succinate (TOPROL-XL) 25 MG 24  hr tablet Take 1 tablet (25 mg total) by mouth daily. 05/25/21   Sueanne Margarita, MD  ondansetron (ZOFRAN ODT) 4 MG disintegrating tablet Take 1 tablet (4 mg total) by mouth every 8 (eight) hours as needed for nausea or vomiting. 12/02/19   Deno Etienne, DO  pantoprazole (PROTONIX) 40 MG tablet Take 40 mg by mouth daily. 07/03/19   [provider]  Probiotic Product (PROBIOTIC PO) Take 1 capsule by mouth daily.    [provider]   promethazine (PHENERGAN) 25 MG tablet Take 25 mg by mouth every 8 (eight) hours as needed for nausea or vomiting.    [provider]  rosuvastatin (CRESTOR) 5 MG tablet Take 1 tablet (5 mg total) by mouth daily. 05/25/21   Sueanne Margarita, MD  temazepam (RESTORIL) 15 MG capsule Take 15 mg by mouth at bedtime.  10/10/17   [provider]    Allergies    Dilaudid [hydromorphone hcl], Heparin, Ambien [zolpidem tartrate], Codeine, Morphine and related, Penicillins, Statins, Suvorexant, Amitriptyline, Ceftin [cefuroxime axetil], Lovaza [omega-3-acid ethyl esters], Lunesta [eszopiclone], Lyrica [pregabalin], and Metformin and related  Review of Systems   Review of Systems  Constitutional:  Negative for chills and fever.  HENT:  Negative for ear pain and sore throat.   Eyes:  Negative for pain and visual disturbance.  Respiratory:  Negative for cough and shortness of breath.   Cardiovascular:  Positive for chest pain. Negative for palpitations.  Gastrointestinal:  Negative for abdominal pain and vomiting.  Genitourinary:  Negative for dysuria and hematuria.  Musculoskeletal:  Negative for arthralgias and back pain.  Skin:  Negative for color change and rash.  Neurological:  Negative for seizures and syncope.  All other systems reviewed and are negative.  Physical Exam Updated Vital Signs BP 123/70 (BP Location: Right Arm)   Pulse 90   Temp 98.5 F (36.9 C) (Oral)   Resp 20   Ht 5\' 1"  (1.549 m)   Wt 103 kg   SpO2 (!) 89%   BMI 42.91 kg/m   Physical Exam Vitals and nursing note reviewed.  Constitutional:      General: She is not in acute distress.    Appearance: She is well-developed. She is not ill-appearing.  HENT:     Head: Normocephalic and atraumatic.  Eyes:     Conjunctiva/sclera: Conjunctivae normal.     Pupils: Pupils are equal, round, and reactive to light.  Cardiovascular:     Rate and Rhythm: Normal rate and regular rhythm.     Pulses:           Radial pulses are 2+ on the right side and 2+ on the left side.     Heart sounds: Normal heart sounds. No murmur heard. Pulmonary:     Effort: Pulmonary effort is normal. No respiratory distress.     Breath sounds: Normal breath sounds. No decreased breath sounds, wheezing or rhonchi.  Chest:     Chest wall: Tenderness present.  Abdominal:     Palpations: Abdomen is soft.     Tenderness: There is no abdominal tenderness.  Musculoskeletal:        General: No swelling. Normal range of motion.     Cervical back: Normal range of motion and neck supple.     Right lower leg: No edema.     Left lower leg: No edema.  Skin:    General: Skin is warm and dry.     Capillary Refill: Capillary refill takes less than  2 seconds.  Neurological:     General: No focal deficit present.     Mental Status: She is alert.  Psychiatric:        Mood and Affect: Mood normal.    ED Results / Procedures / Treatments   Labs (all labs ordered are listed, but only abnormal results are displayed) Labs Reviewed  BASIC METABOLIC PANEL - Abnormal; Notable for the following components:      Result Value   Glucose, Bld 118 (*)    All other components within normal limits  CBC - Abnormal; Notable for the following components:   WBC 11.8 (*)    RBC 5.20 (*)    All other components within normal limits  RESP PANEL BY RT-PCR (FLU A&B, COVID) ARPGX2  TROPONIN I (HIGH SENSITIVITY)  TROPONIN I (HIGH SENSITIVITY)    EKG EKG Interpretation  Date/Time:  Monday July 05 2021 11:49:20 EST Ventricular Rate:  97 PR Interval:  208 QRS Duration: 91 QT Interval:  360 QTC Calculation: 458 R Axis:   -38 Text Interpretation: Sinus rhythm Borderline prolonged PR interval Left axis deviation Confirmed by Lennice Sites ([id number]) on 07/05/2021 12:17:16 PM  Radiology DG Chest 2 View  Result Date: 07/05/2021 CLINICAL DATA:  Chest pain EXAM: CHEST - 2 VIEW COMPARISON:  April 2021 FINDINGS: Shallow inspiration with low lung  volumes. No consolidation or edema. No pleural effusion or pneumothorax. Cardiomediastinal contours within normal limits. No acute osseous abnormality. IMPRESSION: No acute process in the chest. Electronically Signed   By: Macy Mis M.D.   On: 07/05/2021 12:19    Procedures Procedures   Medications Ordered in ED Medications  fentaNYL (SUBLIMAZE) injection 50 mcg (50 mcg Intravenous Given 07/05/21 1357)    ED Course  I have reviewed the triage vital signs and the nursing notes.  Pertinent labs & imaging results that were available during my care of the patient were reviewed by me and considered in my medical decision making (see chart for details).    MDM Rules/Calculators/A&P                           [name] is here with chest pain.  Normal vitals.  No fever.  History of mild CAD, DVT, hypertension, high cholesterol.  Patient started with left-sided chest pain that began this morning at rest.  Seems to be reproducible on exam.  EKG shows sinus rhythm.  On change from prior EKGs.  Troponin negative x2.  Patient did have coronary CT study last year that was overall unremarkable.  No change with nitroglycerin.  Talked with Dr. Sebastian Ache with cardiology and given unremarkable work-up today and suspicion for muscular type pain okay for cardiology work-up outpatient if CT PE scan that has been ordered is negative.  She does have a history of DVT.  She is not having any major respiratory symptoms but given her history we will get a CT scan to rule out PE.  Viral testing is negative.  She has no significant anemia, electrolyte issue, kidney injury otherwise.  Chest x-ray negative for infection.  Patient signed out to oncoming ED staff with patient pending CT scan of her chest.  Assuming that this test is within normal limits can follow-up closely with cardiology and recommend Tylenol and ibuprofen for pain management otherwise.  Recommend follow-up with primary care doctor.  This chart was  dictated using voice recognition software.  Despite best efforts to proofread,  errors can  occur which can change the documentation meaning.   Final Clinical Impression(s) / ED Diagnoses Final diagnoses:  Chest pain, unspecified type    Rx / DC Orders ED Discharge Orders     None        Lennice Sites, DO 07/05/21 1442

## 2021-07-05 NOTE — Discharge Instructions (Signed)
Please follow-up with your PCP and a cardiologist for outpatient follow-up.  If any symptoms change or worsen acutely, please return to the nearest emergency department.

## 2021-08-12 ENCOUNTER — Ambulatory Visit
Admission: RE | Admit: 2021-08-12 | Discharge: 2021-08-12 | Disposition: A | Discharge status: Home / Self Care | Payer: Medicare Other | Source: Ambulatory Visit | Attending: Family Medicine | Admitting: Family Medicine

## 2021-08-12 DIAGNOSIS — M810 Age-related osteoporosis without current pathological fracture: Secondary | ICD-10-CM

## 2021-09-19 ENCOUNTER — Other Ambulatory Visit: Payer: Self-pay | Admitting: Cardiology

## 2021-10-18 ENCOUNTER — Other Ambulatory Visit (HOSPITAL_COMMUNITY): Payer: Self-pay

## 2021-10-21 ENCOUNTER — Other Ambulatory Visit (HOSPITAL_COMMUNITY): Payer: Self-pay

## 2021-12-06 ENCOUNTER — Other Ambulatory Visit: Payer: Self-pay | Admitting: Surgery

## 2021-12-06 DIAGNOSIS — K432 Incisional hernia without obstruction or gangrene: Secondary | ICD-10-CM

## 2021-12-30 ENCOUNTER — Ambulatory Visit
Admission: RE | Admit: 2021-12-30 | Discharge: 2021-12-30 | Disposition: A | Discharge status: Home / Self Care | Payer: Medicare Other | Source: Ambulatory Visit | Attending: Surgery | Admitting: Surgery

## 2021-12-30 DIAGNOSIS — K432 Incisional hernia without obstruction or gangrene: Secondary | ICD-10-CM

## 2021-12-30 MED ORDER — IOPAMIDOL (ISOVUE-300) INJECTION 61%
100.0000 mL | Freq: Once | INTRAVENOUS | Status: AC | PRN
  Administered 2021-12-30: 75 mL via INTRAVENOUS

## 2022-01-04 ENCOUNTER — Ambulatory Visit: Payer: Self-pay | Admitting: Surgery

## 2022-01-11 ENCOUNTER — Ambulatory Visit: Payer: Medicare Other | Admitting: Family Medicine

## 2022-01-11 ENCOUNTER — Encounter: Payer: Self-pay | Admitting: Family Medicine

## 2022-01-11 VITALS — BP 132/80 | HR 82 | Ht 61.0 in | Wt 216.5 lb

## 2022-01-11 DIAGNOSIS — G43709 Chronic migraine without aura, not intractable, without status migrainosus: Secondary | ICD-10-CM | POA: Diagnosis not present

## 2022-01-11 MED ORDER — IMIPRAMINE HCL 50 MG PO TABS: 50.0000 mg | ORAL_TABLET | Freq: Every day | ORAL | 3 refills | Status: DC

## 2022-01-11 NOTE — Patient Instructions (Addendum)
Below is our plan:  We will continue imipramine '50mg'$  at bedtime.   Please make sure you are staying well hydrated. I recommend 50-60 ounces daily. Well balanced diet and regular exercise encouraged. Consistent sleep schedule with 6-8 hours recommended.   Please continue follow up with care team as directed.   Follow up with me in 1 year   You may receive a survey regarding today's visit. I encourage you to leave honest feed back as I do use this information to improve patient care. Thank you for seeing me today!

## 2022-01-11 NOTE — Progress Notes (Signed)
PATIENT: [name] DOB: [birth year]  REASON FOR VISIT: follow up HISTORY FROM: patient  Chief Complaint  Patient presents with   Follow-up    Rm 11, alone. Pt here for yearly migraine fu. Migraines are well controlled. Has not had any migraines.     HISTORY OF PRESENT ILLNESS:  01/11/2022 ALL: [name] returns for follow up for migraines. She continues imipramine '50mg'$  QHS. She continues to feel migraines are well managed. She rarely has a headache. Tylenol works well for abortive therapy. She continues to work with care team for chronic comorbidities.   01/14/2021 ALL: [name] returns for follow up for migraines. She continues imipramine '50mg'$  at bedtime. She reports headaches are significantly improved. She may take Tylenol a couple times a month and usually aborts headache. She had emergency surgery in 09/2019 for perforated bowel with sepsis. She is recovering well. She is followed closely by GI and PCP. She had the flu last week.   07/16/2020 MM: [name] is a 71 year old female with a history of migraine headaches.  She returns today for follow-up.  She is currently on imipramine 50 mg at bedtime.  She states that since she started this medication she has not had any headaches.  She states that has been working  well for her.  She denies any significant side effects.  She returns today for an evaluation.  04/14/2020 ALL: [name] is a 71 y.o. female here today for follow up for migraines. She was unable to afford Ajovy or Amovig. We have not tried [employer]. She continues to have daily headaches. She feels a lot are triggered by chronic sinus infections. She admits that blood sugars are up and down. Stress definitely contributes. She has chronic pain. She is no longer on opioid therapy. She continues gabapentin and duloxetine.   She does snore. She feels that this is related to her having chronic sinus issues. She occasionally wakes with headache but feels headaches more frequently get  worse throughout the day. She denies frequent waking. She denies excessive daytime sleepiness. She denies waking with dry mouth.   She continues regular follow up with PCP, pain management and endocrinology. She is on daily meal replacement insulin as well as daily basil insulin. CBGs "are up and down". Fasting reading this morning was 200. Last A1C 10. BP usually well managed. She continues metoprolol.    HISTORY: (copied from my note on 09/10/2019)  [name] is a 71 y.o. female here today for follow up for new onset headaches. She was started on Ajovy for concerns of daily headaches with migrainous features. MRI normal. She reports that headaches improved in December. She repeated injection in January and does not feel that it was as effective. She has had trouble with sinus infection, sore throat, allergy symptoms and feels that has contributed as well. She no longer has daily headaches. She feels that she had a headache at least 10-15 days last month. Still has a lot of stress. CBG's shot up around 400 after taking doxycycline and prednisone, course completed last week. BP is up and down. She has not taken her BP meds today because she has not felt well. She is having neck pain, s/p ACDF in 04/2019. She continues to follow with Dr Maryjean Ka with [facility]. She has had to take oxycodone more recently due to neck pain.     HISTORY: (copied from Dr Cathren Laine note on 07/10/2019)   HPI:  [name] is a 71  y.o. female here as requested by Harlan Stains, MD for headache.  Past medical history hypertension, DVT on Xarelto, ACDF cervical, diabetes, fibromyalgia, aortic insufficiency.  Being sent for headache.  Patient was seen in the emergency room in July of this year with elevated high blood pressure complaining of headache on the top of the head in the setting of blood pressure 180s over 90s throughout the day, it was 198/100 before she took her blood pressure medications but never significantly  decreased, no other focal neurologic symptoms, chronic pain in her neck, she was taking Mucinex which may have been contributing to her hypertension.  CT of the head was negative.  Hydralazine was given, suspected headache due to blood pressure elevation, patient was feeling better when blood pressure improved to the 150s to the 160s.   Mother and grandmother had headaches. Headaches recently started 7-8 months ago. No inciting events, no new medications or head trauma or anything she can tie it back to. Gradually started. [place] trigers. She has pain in the face behind the eyes and in the sinuses, she was told she had sinusitis but Dr. Redmond Baseman E[facility] said she didn't. The pain comes behind her head, up the neck, and radiates to the temples, feels liks her head is bouncing, pounding and pulsating and throbbing, dull ache, she recently had an ACDF and no changes to the headache since then, not better or worse, she wakes up with the headaches every day, not overly tired during the day, she has headaches every day, she has tylenol, advil, oxycodone, flexeril, lorazepam, heat, ice, no difference, she reports no significant vision changes, the headache is positional and exertional and bending over for example made her hurt and get dizzy.    Reviewed notes, labs and imaging from outside physicians, which showed:   Personally reviewed CT of the head report February 26, 2019 which showed unremarkable brain.   REVIEW OF SYSTEMS: Out of a complete 14 system review of symptoms, the patient complains only of the following symptoms, chronic headaches, chronic sinusitis, chronic pain, fatigue, anxiety, and all other reviewed systems are negative.   ALLERGIES: Allergies  Allergen Reactions   Dilaudid [Hydromorphone Hcl] Other (See Comments)    Hallucinations/ argumentative, goes beserk   Heparin Other (See Comments)    HIT ab positive  (SRA negative 11/25/17)    Ambien [Zolpidem Tartrate] Other (See Comments)    Up sleep  walking and eating   Codeine Nausea And Vomiting   Morphine And Related Other (See Comments)    Flushing and feeling hot Tolerates with Diphenhydramine   Penicillins Rash and Other (See Comments)    Caused Headaches also Has patient had a PCN reaction causing immediate rash, facial/tongue/throat swelling, SOB or lightheadedness with hypotension: No Has patient had a PCN reaction causing severe rash involving mucus membranes or skin necrosis: No Has patient had a PCN reaction that required hospitalization No Has patient had a PCN reaction occurring within the last 10 years: No If all of the above answers are "NO", then may proceed with Cephalosporin use.     Statins Other (See Comments)    Muscle weakness, cramps and aching all over body   Suvorexant     ineffective Other reaction(s): Other ineffective   Amitriptyline Other (See Comments)    Loopy feeling   Ceftin [Cefuroxime Axetil] Other (See Comments)    Stomach pain    Lovaza [Omega-3-Acid Ethyl Esters] Other (See Comments)    Stomach pain    Lunesta [Eszopiclone]  Other (See Comments)    Causes dizziness   Lyrica [Pregabalin] Nausea Only   Metformin And Related Nausea Only    HOME MEDICATIONS: Outpatient Medications Prior to Visit  Medication Sig Dispense Refill   acetaminophen (TYLENOL) 500 MG tablet Take 1,000 mg by mouth every 8 (eight) hours as needed for mild pain or headache.     amLODipine (NORVASC) 2.5 MG tablet Take 2 tablets (5 mg total) by mouth daily. 90 tablet 3   aspirin EC 81 MG tablet Take 81 mg by mouth daily.     Cholecalciferol (VITAMIN D3) 10 MCG (400 UNIT) CHEW Chew by mouth. OTC     Cyanocobalamin (B-12) 2500 MCG TABS Take 2,500 mcg by mouth daily.     cyclobenzaprine (FLEXERIL) 10 MG tablet Take 1 tablet (10 mg total) by mouth 3 (three) times daily as needed for muscle spasms. 30 tablet 0   dexlansoprazole (DEXILANT) 60 MG capsule Take 60 mg by mouth daily.     docusate sodium (COLACE) 100 MG  capsule Take 1 capsule (100 mg total) by mouth 2 (two) times daily. (Patient taking differently: Take 100 mg by mouth 2 (two) times daily as needed.) 60 capsule 0   DULoxetine (CYMBALTA) 30 MG capsule Take 30 mg by mouth daily.     fluticasone (FLONASE) 50 MCG/ACT nasal spray Place 2 sprays into both nostrils daily as needed for allergies or rhinitis.     furosemide (LASIX) 20 MG tablet TAKE 1 TABLET (20 MG TOTAL) BY MOUTH DAILY AS NEEDED FOR FLUID OR EDEMA. 90 tablet 2   gabapentin (NEURONTIN) 300 MG capsule Take 3 capsules by mouth at bedtime.     ibandronate (BONIVA) 150 MG tablet Take 150 mg by mouth every 30 (thirty) days.     insulin glargine (LANTUS) 100 UNIT/ML injection Inject 44 Units into the skin daily. Takes in am     insulin lispro (HUMALOG) 100 UNIT/ML KwikPen Inject 20 Units into the skin 3 (three) times daily.     irbesartan (AVAPRO) 300 MG tablet Take 1 tablet (300 mg total) by mouth daily. 90 tablet 3   levothyroxine (SYNTHROID, LEVOTHROID) 150 MCG tablet Take 150 mcg by mouth daily before breakfast.  5   LORazepam (ATIVAN) 0.5 MG tablet TAKE 1 TABLET AS NEEDED FOR ANXIETY EVERY 8 HRS     Magnesium 100 MG CAPS Take by mouth. OTC     meclizine (ANTIVERT) 12.5 MG tablet Take 25 mg by mouth 3 (three) times daily as needed for dizziness.     metoprolol succinate (TOPROL-XL) 25 MG 24 hr tablet Take 1 tablet (25 mg total) by mouth daily. 90 tablet 3   ondansetron (ZOFRAN ODT) 4 MG disintegrating tablet Take 1 tablet (4 mg total) by mouth every 8 (eight) hours as needed for nausea or vomiting. 20 tablet 0   pantoprazole (PROTONIX) 40 MG tablet Take 40 mg by mouth daily.     Probiotic Product (PROBIOTIC PO) Take 1 capsule by mouth daily.     promethazine (PHENERGAN) 25 MG tablet Take 25 mg by mouth every 8 (eight) hours as needed for nausea or vomiting.     rosuvastatin (CRESTOR) 5 MG tablet Take 1 tablet (5 mg total) by mouth daily. 90 tablet 3   temazepam (RESTORIL) 15 MG capsule Take  15 mg by mouth at bedtime.   4   imipramine (TOFRANIL) 50 MG tablet Take 1 tablet (50 mg total) by mouth daily. 90 tablet 3   DULoxetine (CYMBALTA) 60 MG  capsule Take 60 mg by mouth daily.      ferrous sulfate 325 (65 FE) MG tablet Take 1 tablet (325 mg total) by mouth 2 (two) times daily with a meal. 60 tablet 3   gabapentin (NEURONTIN) 300 MG capsule Take 600 mg by mouth at bedtime.   1   No facility-administered medications prior to visit.    PAST MEDICAL HISTORY: Past Medical History:  Diagnosis Date   Anemia    iron deficiency   Arthritis    Blood transfusion    CAD (coronary artery disease), native coronary artery    coronary CTA showed a coronary Ca score of 506, mild CAD with 25-49% mRCA and moderate CAD with 50-69% sequential mRCA, 25-49% inferior sub branch of D1 and 25-49% in pLCx.     Cervical spondylosis with myelopathy and radiculopathy    CVA (cerebral vascular accident) ([place])    11/2017   Depression    Diabetes mellitus    Type II   Diastolic dysfunction    Diverticulitis    DJD (degenerative joint disease)    DVT (deep venous thrombosis) (HCC)    times 2 lower leg   Endometriosis    Family history of adverse reaction to anesthesia    mom and dad PONV   Fibromyalgia    GERD (gastroesophageal reflux disease)    occ   Hiatal hernia    History of kidney stones    Hyperlipidemia    Hypertension    Hypothyroidism    Insomnia    Mild aortic insufficiency    by echo 04/2013   Osteoarthritis    back and knee   Ovarian cyst    PONV (postoperative nausea and vomiting)    RLS (restless legs syndrome)    Thoracic compression fracture (HCC)    Uterine fibroid    UTI (lower urinary tract infection)    Villous adenoma of right colon 04/08/2016   Vitamin D deficiency disease     PAST SURGICAL HISTORY: Past Surgical History:  Procedure Laterality Date   ANTERIOR CERVICAL DECOMP/DISCECTOMY FUSION N/A 03/23/2017   Procedure: ANTERIOR CERVICAL  DECOMPRESSION/DISCECTOMY FUSION, INTERBODY PROSTHESIS,PLATE CERVICAL THREE- CERVICAL FOUR, CERVICAL FOUR- CERVICAL FIVE, CERVICAL FIVE- CERVICAL SIX;  Surgeon: Newman Pies, MD;  Location: [place];  Service: Neurosurgery;  Laterality: N/A;   ANTERIOR CERVICAL DECOMP/DISCECTOMY FUSION N/A 04/18/2019   Procedure: ANTERIOR CERVICAL DECOMPRESSION/DISCECTOMY  CERVICAL SIX- CERVICAL SEVEN;  Surgeon: Newman Pies, MD;  Location: [place];  Service: Neurosurgery;  Laterality: N/A;  anterior   APPENDECTOMY     BACK SURGERY  2005   April  2013 - spinal fusion[facility]   bowel blockage surgery     BREAST SURGERY     breast reduction   CARDIAC CATHETERIZATION  04/2013   normal coronary arteries and normal LVF   CARPAL TUNNEL RELEASE  06/05/2012   Procedure: CARPAL TUNNEL RELEASE;  Surgeon: Cammie Sickle., MD;  Location: [place];  Service: Orthopedics;  Laterality: Left;   CESAREAN SECTION     x 2   CHOLECYSTECTOMY     COLECTOMY  04/08/2016   DILATION AND CURETTAGE OF UTERUS     DORSAL COMPARTMENT RELEASE  06/05/2012   Procedure: RELEASE DORSAL COMPARTMENT (DEQUERVAIN);  Surgeon: Cammie Sickle., MD;  Location: [facility];  Service: Orthopedics;  Laterality: Left;  Excision of mixoid cyst also   EYE SURGERY     cataracts bilateral   JOINT REPLACEMENT     Bilateral  knee   KNEE ARTHROPLASTY  2009   lft partial   KNEE ARTHROPLASTY     rt   LAPAROSCOPIC PARTIAL COLECTOMY N/A 04/08/2016   Procedure: LAPAROSCOPIC ASSISTED ASCENDING COLECTOMY POSSIBLE OPEN COLECTOMY;  Surgeon: Fanny Skates, MD;  Location: [place];  Service: General;  Laterality: N/A;   orthopedic surgeries     multiple   perforated bowl     Sep 09 2020   REDUCTION MAMMAPLASTY Bilateral    SHOULDER OPEN ROTATOR CUFF REPAIR     rt and lft   TEE WITHOUT CARDIOVERSION N/A 11/23/2017   Procedure: TRANSESOPHAGEAL ECHOCARDIOGRAM (TEE);  Surgeon: Jerline Pain, MD;  Location: [facility];   Service: Cardiovascular;  Laterality: N/A;   TUBAL LIGATION     btsp    FAMILY HISTORY: Family History  Problem Relation Age of Onset   Heart attack Father        18s   Hypothyroidism Father    Diabetes type II Father    Kidney failure Father    Diabetes type II Mother    Hypertension Mother    Breast cancer Mother 58   Dementia Mother    Hypothyroidism Brother    Diabetes Brother    Breast cancer Maternal Aunt    Breast cancer Cousin    Breast cancer Maternal Aunt    Breast cancer Cousin    High blood pressure Other        "all"   Heart attack Brother    Lung cancer Brother     SOCIAL HISTORY: Social History   Socioeconomic History   Marital status: Married    Spouse name: Not on file   Number of children: 2   Years of education: trade school   Highest education level: Not on file  Occupational History   Not on file  Tobacco Use   Smoking status: Never   Smokeless tobacco: Never  Vaping Use   Vaping Use: Never used  Substance and Sexual Activity   Alcohol use: Never   Drug use: Never   Sexual activity: Yes    Birth control/protection: Post-menopausal, Surgical  Other Topics Concern   Not on file  Social History Narrative   Lives at home with her husband   Right handed   Caffeine: 2-3 cups of coffee/day   Social Determinants of Health   Financial Resource Strain: Not on file  Food Insecurity: Not on file  Transportation Needs: Not on file  Physical Activity: Not on file  Stress: Not on file  Social Connections: Not on file  Intimate Partner Violence: Not on file      PHYSICAL EXAM  Vitals:   01/11/22 1453  BP: 132/80  Pulse: 82  Weight: 216 lb 8 oz (98.2 kg)  Height: '5\' 1"'$  (1.549 m)     Body mass index is 40.91 kg/m.  Generalized: Well developed, in no acute distress  Cardiology: normal rate and rhythm, no murmur noted Respiratory: clear to auscultation bilaterally  Neurological examination  Mentation: Alert oriented to time,  place, history taking. Follows all commands speech and language fluent Cranial nerve II-XII: Pupils were equal round reactive to light. Extraocular movements were full, visual field were full on confrontational test. Facial sensation and strength were normal. Uvula tongue midline. Head turning and shoulder shrug  were normal and symmetric. Motor: The motor testing reveals 5 over 5 strength of all 4 extremities. Good symmetric motor tone is noted throughout.  Gait and station: Gait is normal.    DIAGNOSTIC  DATA (LABS, IMAGING, TESTING) - I reviewed patient records, labs, notes, testing and imaging myself where available.      View : No data to display.           Lab Results  Component Value Date   WBC 11.8 (H) 07/05/2021   HGB 14.2 07/05/2021   HCT 43.5 07/05/2021   MCV 83.7 07/05/2021   PLT 262 07/05/2021      Component Value Date/Time   NA 140 07/05/2021 1153   NA 133 (L) 02/18/2020 1119   K 3.6 07/05/2021 1153   CL 101 07/05/2021 1153   CO2 29 07/05/2021 1153   GLUCOSE 118 (H) 07/05/2021 1153   BUN 15 07/05/2021 1153   BUN 14 02/18/2020 1119   CREATININE 0.99 07/05/2021 1153   CALCIUM 9.9 07/05/2021 1153   PROT 7.0 12/02/2019 1523   PROT 6.5 07/10/2019 1442   ALBUMIN 3.8 12/02/2019 1523   ALBUMIN 4.1 07/10/2019 1442   AST 17 12/02/2019 1523   ALT 15 12/02/2019 1523   ALKPHOS 125 12/02/2019 1523   BILITOT 0.4 12/02/2019 1523   BILITOT 0.4 07/10/2019 1442   GFRNONAA >60 07/05/2021 1153   GFRAA 73 02/18/2020 1119   Lab Results  Component Value Date   CHOL 162 03/25/2020   HDL 48 03/25/2020   LDLCALC 94 03/25/2020   TRIG 109 03/25/2020   CHOLHDL 3.4 03/25/2020   Lab Results  Component Value Date   HGBA1C 10.1 (H) 07/10/2019   No results found for: VITAMINB12 Lab Results  Component Value Date   TSH 1.310 07/10/2019       ASSESSMENT AND PLAN 71 y.o. year old female  has a past medical history of Anemia, Arthritis, Blood transfusion, CAD (coronary  artery disease), native coronary artery, Cervical spondylosis with myelopathy and radiculopathy, CVA (cerebral vascular accident) ([place]), Depression, Diabetes mellitus, Diastolic dysfunction, Diverticulitis, DJD (degenerative joint disease), DVT (deep venous thrombosis) ([place]), Endometriosis, Family history of adverse reaction to anesthesia, Fibromyalgia, GERD (gastroesophageal reflux disease), Hiatal hernia, History of kidney stones, Hyperlipidemia, Hypertension, Hypothyroidism, Insomnia, Mild aortic insufficiency, Osteoarthritis, Ovarian cyst, PONV (postoperative nausea and vomiting), RLS (restless legs syndrome), Thoracic compression fracture ([place]), Uterine fibroid, UTI (lower urinary tract infection), Villous adenoma of right colon (04/08/2016), and Vitamin D deficiency disease. here with     ICD-10-CM   1. Chronic migraine without aura without status migrainosus, not intractable  G43.709         Alyssa is doing well, today. She is tolerating imipramine with no obvious adverse effects. Headaches have reduced in frequency and intensity. She will continue '50mg'$  at bedtime. Healthy lifestyle habits advised.  I have encouraged her to continue close follow up with PCP, GI, pain management and endocrinology. Healthy lifestyle habits discussed. She will follow up with me in 1 year, sooner if needed.    No orders of the defined types were placed in this encounter.     Meds ordered this encounter  Medications   imipramine (TOFRANIL) 50 MG tablet    Sig: Take 1 tablet (50 mg total) by mouth daily.    Dispense:  90 tablet    Refill:  3    Order Specific Question:   Supervising Provider    Answer:   Melvenia Beam [[id number]]      QIW LNLGX, FNP-C 01/11/2022, 3:20 PM [facility] [address], [place], [place] [address] [phone]

## 2022-01-12 ENCOUNTER — Telehealth: Payer: Self-pay | Admitting: Cardiology

## 2022-01-12 NOTE — Telephone Encounter (Signed)
Follow Up:     Patient is calling to check on the status of her clearance. They are waiting for clearance to schedule procedure.

## 2022-01-14 NOTE — Telephone Encounter (Signed)
   Pre-operative Risk Assessment    Patient Name: [name]  DOB: 03-Feb-1951 MRN: [id number]      Request for Surgical Clearance    Procedure:   HERNIA SURGERY  Date of Surgery:  Clearance TBD                                 Surgeon:  DR. Erroll Luna Surgeon's Group or Practice Name:  [facility] Phone number:  [phone] Fax number:  [phone] ATTN: Carlene Coria, CMA   Type of Clearance Requested:   - Medical ; HOLD ASA INSTRUCTIONS NEEDED   Type of Anesthesia:  General    Additional requests/questions:    Jiles Prows   01/14/2022, 5:22 PM

## 2022-01-17 ENCOUNTER — Telehealth: Payer: Self-pay | Admitting: *Deleted

## 2022-01-17 NOTE — Telephone Encounter (Signed)
Primary Cardiologist:Traci Turner, MD  Chart reviewed as part of pre-operative protocol coverage. Because of [name]'s past medical history and time since last visit, he/she will require a virtual visit/telephone call in order to better assess preoperative cardiovascular risk.  Pre-op covering staff: - Please contact patient, obtain consent, and schedule appointment   If applicable, this message will also be routed to pharmacy pool and/or primary cardiologist for input on holding anticoagulant/antiplatelet agent as requested below so that this information is available at time of patient's appointment.   Morgan Life, NP-C    01/17/2022, 8:45 AM [place] [address] Office [phone] Fax [phone]

## 2022-01-17 NOTE — Telephone Encounter (Signed)
Pt has been scheduled for tele pre op appt 01/18/22 '@11'$  am. Med rec and consent are done.    Patient Consent for Virtual Visit        [name] has provided verbal consent on 01/17/2022 for a virtual visit (video or telephone).   CONSENT FOR VIRTUAL VISIT FOR:  [name]  By participating in this virtual visit I agree to the following:  I hereby voluntarily request, consent and authorize [facility] and its employed or contracted physicians, physician assistants, nurse practitioners or other licensed health care professionals (the Practitioner), to provide me with telemedicine health care services (the "Services") as deemed necessary by the treating Practitioner. I acknowledge and consent to receive the Services by the Practitioner via telemedicine. I understand that the telemedicine visit will involve communicating with the Practitioner through live audiovisual communication technology and the disclosure of certain medical information by electronic transmission. I acknowledge that I have been given the opportunity to request an in-person assessment or other available alternative prior to the telemedicine visit and am voluntarily participating in the telemedicine visit.  I understand that I have the right to withhold or withdraw my consent to the use of telemedicine in the course of my care at any time, without affecting my right to future care or treatment, and that the Practitioner or I may terminate the telemedicine visit at any time. I understand that I have the right to inspect all information obtained and/or recorded in the course of the telemedicine visit and may receive copies of available information for a reasonable fee.  I understand that some of the potential risks of receiving the Services via telemedicine include:  Delay or interruption in medical evaluation due to technological equipment failure or disruption; Information transmitted may not be sufficient (e.g. poor  resolution of images) to allow for appropriate medical decision making by the Practitioner; and/or  In rare instances, security protocols could fail, causing a breach of personal health information.  Furthermore, I acknowledge that it is my responsibility to provide information about my medical history, conditions and care that is complete and accurate to the best of my ability. I acknowledge that Practitioner's advice, recommendations, and/or decision may be based on factors not within their control, such as incomplete or inaccurate data provided by me or distortions of diagnostic images or specimens that may result from electronic transmissions. I understand that the practice of medicine is not an exact science and that Practitioner makes no warranties or guarantees regarding treatment outcomes. I acknowledge that a copy of this consent can be made available to me via my patient portal ([facility]), or I can request a printed copy by calling the office of [facility].    I understand that my insurance will be billed for this visit.   I have read or had this consent read to me. I understand the contents of this consent, which adequately explains the benefits and risks of the Services being provided via telemedicine.  I have been provided ample opportunity to ask questions regarding this consent and the Services and have had my questions answered to my satisfaction. I give my informed consent for the services to be provided through the use of telemedicine in my medical care

## 2022-01-17 NOTE — Telephone Encounter (Signed)
Pt has been scheduled for tele pre op appt 01/18/22 '@11'$  am. Med rec and consent are done.

## 2022-01-18 ENCOUNTER — Ambulatory Visit (INDEPENDENT_AMBULATORY_CARE_PROVIDER_SITE_OTHER): Payer: Medicare Other | Admitting: Nurse Practitioner

## 2022-01-18 ENCOUNTER — Encounter: Payer: Self-pay | Admitting: Nurse Practitioner

## 2022-01-18 DIAGNOSIS — Z01810 Encounter for preprocedural cardiovascular examination: Secondary | ICD-10-CM | POA: Diagnosis not present

## 2022-01-18 NOTE — Progress Notes (Signed)
Virtual Visit via Telephone Note   Because of [name]'s co-morbid illnesses, she is at least at moderate risk for complications without adequate follow up.  This format is felt to be most appropriate for this patient at this time.  The patient did not have access to video technology/had technical difficulties with video requiring transitioning to audio format only (telephone).  All issues noted in this document were discussed and addressed.  No physical exam could be performed with this format.  Please refer to the patient's chart for her consent to telehealth for [facility].  Evaluation Performed:  Preoperative cardiovascular risk assessment _____________   Date:  01/18/2022   Patient ID:  [name], DOB June 04, 1951, MRN [id number] Patient Location:  Home Provider location:   Office  Primary Care Provider:  Harlan Stains, MD Primary Cardiologist:  Fransico Him, MD  Chief Complaint / Patient Profile   71 y.o. y/o female with a h/o nonobstructive CAD, hypertension, type 2 diabetes, CKD, obesity, and PACs who is pending hernia surgery and presents today for telephonic preoperative cardiovascular risk assessment.  Past Medical History    Past Medical History:  Diagnosis Date   Anemia    iron deficiency   Arthritis    Blood transfusion    CAD (coronary artery disease), native coronary artery    coronary CTA showed a coronary Ca score of 506, mild CAD with 25-49% mRCA and moderate CAD with 50-69% sequential mRCA, 25-49% inferior sub branch of D1 and 25-49% in pLCx.     Cervical spondylosis with myelopathy and radiculopathy    CVA (cerebral vascular accident) ([place])    11/2017   Depression    Diabetes mellitus    Type II   Diastolic dysfunction    Diverticulitis    DJD (degenerative joint disease)    DVT (deep venous thrombosis) (HCC)    times 2 lower leg   Endometriosis    Family history of adverse reaction to anesthesia    mom and dad PONV   Fibromyalgia     GERD (gastroesophageal reflux disease)    occ   Hiatal hernia    History of kidney stones    Hyperlipidemia    Hypertension    Hypothyroidism    Insomnia    Mild aortic insufficiency    by echo 04/2013   Osteoarthritis    back and knee   Ovarian cyst    PONV (postoperative nausea and vomiting)    RLS (restless legs syndrome)    Thoracic compression fracture (HCC)    Uterine fibroid    UTI (lower urinary tract infection)    Villous adenoma of right colon 04/08/2016   Vitamin D deficiency disease    Past Surgical History:  Procedure Laterality Date   ANTERIOR CERVICAL DECOMP/DISCECTOMY FUSION N/A 03/23/2017   Procedure: ANTERIOR CERVICAL DECOMPRESSION/DISCECTOMY FUSION, INTERBODY PROSTHESIS,PLATE CERVICAL THREE- CERVICAL FOUR, CERVICAL FOUR- CERVICAL FIVE, CERVICAL FIVE- CERVICAL SIX;  Surgeon: Newman Pies, MD;  Location: [facility];  Service: Neurosurgery;  Laterality: N/A;   ANTERIOR CERVICAL DECOMP/DISCECTOMY FUSION N/A 04/18/2019   Procedure: ANTERIOR CERVICAL DECOMPRESSION/DISCECTOMY  CERVICAL SIX- CERVICAL SEVEN;  Surgeon: Newman Pies, MD;  Location: [facility];  Service: Neurosurgery;  Laterality: N/A;  anterior   APPENDECTOMY     BACK SURGERY  2005   April  2013 - spinal fusion[facility]   bowel blockage surgery     BREAST SURGERY     breast reduction   CARDIAC CATHETERIZATION  04/2013   normal coronary  arteries and normal LVF   CARPAL TUNNEL RELEASE  06/05/2012   Procedure: CARPAL TUNNEL RELEASE;  Surgeon: Cammie Sickle., MD;  Location: [facility];  Service: Orthopedics;  Laterality: Left;   CESAREAN SECTION     x 2   CHOLECYSTECTOMY     COLECTOMY  04/08/2016   DILATION AND CURETTAGE OF UTERUS     DORSAL COMPARTMENT RELEASE  06/05/2012   Procedure: RELEASE DORSAL COMPARTMENT (DEQUERVAIN);  Surgeon: Cammie Sickle., MD;  Location: [facility];  Service: Orthopedics;  Laterality: Left;  Excision of mixoid cyst also   EYE SURGERY      cataracts bilateral   JOINT REPLACEMENT     Bilateral knee   KNEE ARTHROPLASTY  2009   lft partial   KNEE ARTHROPLASTY     rt   LAPAROSCOPIC PARTIAL COLECTOMY N/A 04/08/2016   Procedure: LAPAROSCOPIC ASSISTED ASCENDING COLECTOMY POSSIBLE OPEN COLECTOMY;  Surgeon: Fanny Skates, MD;  Location: [facility];  Service: General;  Laterality: N/A;   orthopedic surgeries     multiple   perforated bowl     Sep 09 2020   REDUCTION MAMMAPLASTY Bilateral    SHOULDER OPEN ROTATOR CUFF REPAIR     rt and lft   TEE WITHOUT CARDIOVERSION N/A 11/23/2017   Procedure: TRANSESOPHAGEAL ECHOCARDIOGRAM (TEE);  Surgeon: Jerline Pain, MD;  [facility];  Service: Cardiovascular;  Laterality: N/A;   TUBAL LIGATION     btsp    Allergies  Allergies  Allergen Reactions   Dilaudid [Hydromorphone Hcl] Other (See Comments)    Hallucinations/ argumentative, goes beserk   Heparin Other (See Comments)    HIT ab positive  (SRA negative 11/25/17)    Ambien [Zolpidem Tartrate] Other (See Comments)    Up sleep walking and eating   Codeine Nausea And Vomiting   Morphine And Related Other (See Comments)    Flushing and feeling hot Tolerates with Diphenhydramine   Penicillins Rash and Other (See Comments)    Caused Headaches also Has patient had a PCN reaction causing immediate rash, facial/tongue/throat swelling, SOB or lightheadedness with hypotension: No Has patient had a PCN reaction causing severe rash involving mucus membranes or skin necrosis: No Has patient had a PCN reaction that required hospitalization No Has patient had a PCN reaction occurring within the last 10 years: No If all of the above answers are "NO", then may proceed with Cephalosporin use.     Statins Other (See Comments)    Muscle weakness, cramps and aching all over body   Suvorexant     ineffective Other reaction(s): Other ineffective   Amitriptyline Other (See Comments)    Loopy feeling   Ceftin [Cefuroxime Axetil] Other  (See Comments)    Stomach pain    Lovaza [Omega-3-Acid Ethyl Esters] Other (See Comments)    Stomach pain    Lunesta [Eszopiclone] Other (See Comments)    Causes dizziness   Lyrica [Pregabalin] Nausea Only   Metformin And Related Nausea Only    History of Present Illness    [name] is a 71 y.o. female who presents via audio/video conferencing for a telehealth visit today.  Pt was last seen in cardiology clinic on 05/25/2021 by Dr. Radford Pax.  At that time [name] was doing well.  The patient is now pending procedure as outlined above. Since her last visit, she denies chest pain, shortness of breath, lower extremity edema, fatigue, palpitations, melena, hematuria, hemoptysis, diaphoresis, weakness, presyncope, syncope,  orthopnea, and PND.  Home Medications    Prior to Admission medications   Medication Sig Start Date End Date Taking? Authorizing Provider  acetaminophen (TYLENOL) 500 MG tablet Take 1,000 mg by mouth every 8 (eight) hours as needed for mild pain or headache.    [provider]  amLODipine (NORVASC) 2.5 MG tablet Take 2 tablets (5 mg total) by mouth daily. 05/25/21   Sueanne Margarita, MD  aspirin EC 81 MG tablet Take 81 mg by mouth daily.    [provider]  Cholecalciferol (VITAMIN D3) 10 MCG (400 UNIT) CHEW Chew by mouth. OTC    [provider]  Cyanocobalamin (B-12) 2500 MCG TABS Take 2,500 mcg by mouth daily.    [provider]  cyclobenzaprine (FLEXERIL) 10 MG tablet Take 1 tablet (10 mg total) by mouth 3 (three) times daily as needed for muscle spasms. 04/19/19   Viona Gilmore D, NP  dexlansoprazole (DEXILANT) 60 MG capsule Take 60 mg by mouth daily.    [provider]  docusate sodium (COLACE) 100 MG capsule Take 1 capsule (100 mg total) by mouth 2 (two) times daily. Patient not taking: Reported on 01/17/2022 11/17/17   Newman Pies, MD  DULoxetine (CYMBALTA) 30 MG capsule Take 30 mg by mouth daily. 08/19/21    [provider]  fluticasone (FLONASE) 50 MCG/ACT nasal spray Place 2 sprays into both nostrils daily as needed for allergies or rhinitis.    [provider]  furosemide (LASIX) 20 MG tablet TAKE 1 TABLET (20 MG TOTAL) BY MOUTH DAILY AS NEEDED FOR FLUID OR EDEMA. 09/21/21   Sueanne Margarita, MD  gabapentin (NEURONTIN) 300 MG capsule Take 3 capsules by mouth at bedtime.    [provider]  ibandronate (BONIVA) 150 MG tablet Take 150 mg by mouth every 30 (thirty) days. 03/06/19   [provider]  imipramine (TOFRANIL) 50 MG tablet Take 1 tablet (50 mg total) by mouth daily. 01/11/22   Lomax, Amy, NP  insulin glargine (LANTUS) 100 UNIT/ML injection Inject 36 Units into the skin daily. Takes in am    [provider]  insulin lispro (HUMALOG) 100 UNIT/ML KwikPen Inject 20 Units into the skin 3 (three) times daily. PT STATES DOING A CARB COUNT; INSULIN DOSE WILL DEPEND ON CARB COUNT (SLIDING SCALE)    [provider]  irbesartan (AVAPRO) 300 MG tablet Take 1 tablet (300 mg total) by mouth daily. 05/25/21   Sueanne Margarita, MD  levothyroxine (SYNTHROID, LEVOTHROID) 150 MCG tablet Take 150 mcg by mouth daily before breakfast. 10/10/17   [provider]  LORazepam (ATIVAN) 0.5 MG tablet TAKE 1 TABLET AS NEEDED FOR ANXIETY EVERY 8 HRS 03/15/19   [provider]  Magnesium 100 MG CAPS Take by mouth. OTC    [provider]  meclizine (ANTIVERT) 12.5 MG tablet Take 25 mg by mouth 3 (three) times daily as needed for dizziness.    [provider]  metoprolol succinate (TOPROL-XL) 25 MG 24 hr tablet Take 1 tablet (25 mg total) by mouth daily. 05/25/21   Sueanne Margarita, MD  ondansetron (ZOFRAN ODT) 4 MG disintegrating tablet Take 1 tablet (4 mg total) by mouth every 8 (eight) hours as needed for nausea or vomiting. 12/02/19   Deno Etienne, DO  pantoprazole (PROTONIX) 40 MG tablet Take 40 mg by mouth daily. 07/03/19   [provider]  Probiotic Product (PROBIOTIC PO) Take 1 capsule by mouth daily.    [provider]  promethazine (PHENERGAN) 25 MG tablet Take 25 mg by mouth every 8 (eight) hours as needed for nausea or vomiting.    [provider]  rosuvastatin (CRESTOR) 5 MG tablet Take 1 tablet (5 mg total) by mouth daily. 05/25/21   Sueanne Margarita, MD  temazepam (RESTORIL) 15 MG capsule Take 15 mg by mouth at bedtime.  10/10/17   [provider]    Physical Exam    Vital Signs:  [name] does not have vital signs available for review today.  Given telephonic nature of communication, physical exam is limited. AAOx3. NAD. Normal affect.  Speech and respirations are unlabored.  Accessory Clinical Findings    None  Assessment & Plan    1.  Preoperative Cardiovascular Risk Assessment: She is doing well from a cardiac perspective and may proceed to surgery without further testing. According to the Revised Cardiac Risk Index (RCRI), her Perioperative Risk of Major Cardiac Event is (%): 6.6. Her Functional Capacity in METs is: 7.59 according to the [facility] Activity Status Index (DASI).   Request to hold aspirin will need to be directed to patient's neurologist as she takes aspirin for history of CVA.   A copy of this note will be routed to requesting surgeon.  Time:   Today, I have spent 10 minutes with the patient with telehealth technology discussing medical history, symptoms, and management plan.     Emmaline Life, NP-C    01/18/2022, 10:59 AM [facility] [address], [address] Office [phone] Fax [phone]

## 2022-01-19 ENCOUNTER — Telehealth: Payer: Self-pay | Admitting: Neurology

## 2022-01-20 ENCOUNTER — Ambulatory Visit: Payer: Medicare Other | Admitting: Family Medicine

## 2022-01-28 ENCOUNTER — Emergency Department (HOSPITAL_BASED_OUTPATIENT_CLINIC_OR_DEPARTMENT_OTHER)
Admission: EM | Admit: 2022-01-28 | Discharge: 2022-01-28 | Disposition: A | Discharge status: Home / Self Care | Payer: Medicare Other | Attending: Emergency Medicine | Admitting: Emergency Medicine

## 2022-01-28 ENCOUNTER — Encounter (HOSPITAL_BASED_OUTPATIENT_CLINIC_OR_DEPARTMENT_OTHER): Payer: Self-pay

## 2022-01-28 ENCOUNTER — Other Ambulatory Visit: Payer: Self-pay

## 2022-01-28 ENCOUNTER — Emergency Department (HOSPITAL_BASED_OUTPATIENT_CLINIC_OR_DEPARTMENT_OTHER): Payer: Medicare Other

## 2022-01-28 DIAGNOSIS — I2510 Atherosclerotic heart disease of native coronary artery without angina pectoris: Secondary | ICD-10-CM | POA: Diagnosis not present

## 2022-01-28 DIAGNOSIS — I10 Essential (primary) hypertension: Secondary | ICD-10-CM | POA: Insufficient documentation

## 2022-01-28 DIAGNOSIS — Z794 Long term (current) use of insulin: Secondary | ICD-10-CM | POA: Diagnosis not present

## 2022-01-28 DIAGNOSIS — Z79899 Other long term (current) drug therapy: Secondary | ICD-10-CM | POA: Diagnosis not present

## 2022-01-28 DIAGNOSIS — Z7982 Long term (current) use of aspirin: Secondary | ICD-10-CM | POA: Insufficient documentation

## 2022-01-28 DIAGNOSIS — E119 Type 2 diabetes mellitus without complications: Secondary | ICD-10-CM | POA: Insufficient documentation

## 2022-01-28 DIAGNOSIS — N390 Urinary tract infection, site not specified: Secondary | ICD-10-CM

## 2022-01-28 DIAGNOSIS — B9689 Other specified bacterial agents as the cause of diseases classified elsewhere: Secondary | ICD-10-CM | POA: Diagnosis not present

## 2022-01-28 DIAGNOSIS — R109 Unspecified abdominal pain: Secondary | ICD-10-CM | POA: Diagnosis present

## 2022-01-28 LAB — URINALYSIS, ROUTINE W REFLEX MICROSCOPIC
Bilirubin Urine: NEGATIVE
Glucose, UA: >1000 mg/dL — AB
Hgb urine dipstick: NEGATIVE
Ketones, ur: NEGATIVE mg/dL
Nitrite: NEGATIVE
Protein, ur: NEGATIVE mg/dL
Specific Gravity, Urine: 1.021 (ref 1.005–1.030)
pH: 6 (ref 5.0–8.0)

## 2022-01-28 LAB — CBC
HCT: 41.2 % (ref 36.0–46.0)
Hemoglobin: 13.3 g/dL (ref 12.0–15.0)
MCH: 28.1 pg (ref 26.0–34.0)
MCHC: 32.3 g/dL (ref 30.0–36.0)
MCV: 87.1 fL (ref 80.0–100.0)
Platelets: 280 10*3/uL (ref 150–400)
RBC: 4.73 MIL/uL (ref 3.87–5.11)
RDW: 13.5 % (ref 11.5–15.5)
WBC: 7.6 10*3/uL (ref 4.0–10.5)
nRBC: 0 % (ref 0.0–0.2)

## 2022-01-28 LAB — BASIC METABOLIC PANEL
Anion gap: 7 (ref 5–15)
BUN: 16 mg/dL (ref 8–23)
CO2: 30 mmol/L (ref 22–32)
Calcium: 10.2 mg/dL (ref 8.9–10.3)
Chloride: 98 mmol/L (ref 98–111)
Creatinine, Ser: 1.11 mg/dL — ABNORMAL HIGH (ref 0.44–1.00)
GFR, Estimated: 53 mL/min — ABNORMAL LOW (ref >60)
Glucose, Bld: 292 mg/dL — ABNORMAL HIGH (ref 70–99)
Potassium: 4.5 mmol/L (ref 3.5–5.1)
Sodium: 135 mmol/L (ref 135–145)

## 2022-01-28 MED ORDER — SODIUM CHLORIDE 0.9 % IV BOLUS
500.0000 mL | Freq: Once | INTRAVENOUS | Status: AC
Start: 2022-01-28 — End: 2022-01-28
  Administered 2022-01-28: 500 mL via INTRAVENOUS

## 2022-01-28 MED ORDER — CEPHALEXIN 500 MG PO CAPS
500.0000 mg | ORAL_CAPSULE | Freq: Four times a day (QID) | ORAL | 0 refills | Status: DC
Start: 2022-01-28 — End: 2022-06-02

## 2022-01-28 MED ORDER — HYDROCODONE-ACETAMINOPHEN 5-325 MG PO TABS: 1.0000 | ORAL_TABLET | Freq: Four times a day (QID) | ORAL | 0 refills | Status: DC | PRN

## 2022-01-28 MED ORDER — FENTANYL CITRATE PF 50 MCG/ML IJ SOSY
50.0000 ug | PREFILLED_SYRINGE | Freq: Once | INTRAMUSCULAR | Status: AC
  Administered 2022-01-28: 50 ug via INTRAVENOUS
  Filled 2022-01-28: qty 1

## 2022-01-28 MED ORDER — ONDANSETRON HCL 4 MG/2ML IJ SOLN
4.0000 mg | Freq: Once | INTRAMUSCULAR | Status: AC
  Administered 2022-01-28: 4 mg via INTRAVENOUS
  Filled 2022-01-28: qty 2

## 2022-01-29 LAB — URINE CULTURE: Culture: NO GROWTH

## 2022-03-04 NOTE — Pre-Procedure Instructions (Signed)
Surgical Instructions    Your procedure is scheduled on March 10, 2022.  Report to [facility] Main Entrance "A" at 5:30 A.M., then check in with the Admitting office.  Call this number if you have problems the morning of surgery:  [phone]   If you have any questions prior to your surgery date call [phone]: Open Monday-Friday 8am-4pm    Remember:  Do not eat after midnight the night before your surgery  You may drink clear liquids until 4:30 AM the morning of your surgery.   Clear liquids allowed are: Water, Non-Citrus Juices (without pulp), Carbonated Beverages, Clear Tea, Black Coffee Only (NO MILK, CREAM OR POWDERED CREAMER of any kind), and Gatorade.    Take these medicines the morning of surgery with A SIP OF WATER:  amLODipine (NORVASC)  DULoxetine (CYMBALTA)  levothyroxine (SYNTHROID)   metoprolol succinate (TOPROL-XL)  pantoprazole (PROTONIX)  rosuvastatin (CRESTOR)  ibandronate (BONIVA)   Take these medicines the morning of surgery AS NEEDED:  acetaminophen (TYLENOL)   cyclobenzaprine (FLEXERIL)  fluticasone (FLONASE)   LORazepam (ATIVAN)  meclizine (ANTIVERT)   ondansetron (ZOFRAN ODT)  promethazine (PHENERGAN)   Follow your surgeon's instructions on when to stop Aspirin.  If no instructions were given by your surgeon then you will need to call the office to get those instructions.     As of today, STOP taking any Aleve, Naproxen, Ibuprofen, Motrin, Advil, Goody's, BC's, all herbal medications, fish oil, and all vitamins.   WHAT DO I DO ABOUT MY DIABETES MEDICATION?   Do not take oral diabetes medicines (pills) the morning of surgery.  The day of surgery, do not take other diabetes injectables, including Byetta (exenatide), Bydureon (exenatide ER), Victoza (liraglutide), or Trulicity (dulaglutide).  Do not take insulin glargine (LANTUS) the morning of surgery.  If your CBG is greater than 220 mg/dL the morning of surgery, you may take  of your  HUMALOG dose of insulin (6-7.5 units).   HOW TO MANAGE YOUR DIABETES BEFORE AND AFTER SURGERY  Why is it important to control my blood sugar before and after surgery? Improving blood sugar levels before and after surgery helps healing and can limit problems. A way of improving blood sugar control is eating a healthy diet by:  Eating less sugar and carbohydrates  Increasing activity/exercise  Talking with your doctor about reaching your blood sugar goals High blood sugars (greater than 180 mg/dL) can raise your risk of infections and slow your recovery, so you will need to focus on controlling your diabetes during the weeks before surgery. Make sure that the doctor who takes care of your diabetes knows about your planned surgery including the date and location.  How do I manage my blood sugar before surgery? Check your blood sugar at least 4 times a day, starting 2 days before surgery, to make sure that the level is not too high or low.  Check your blood sugar the morning of your surgery when you wake up and every 2 hours until you get to the Short Stay unit.  If your blood sugar is less than 70 mg/dL, you will need to treat for low blood sugar: Do not take insulin. Treat a low blood sugar (less than 70 mg/dL) with  cup of clear juice (cranberry or apple), 4 glucose tablets, OR glucose gel. Recheck blood sugar in 15 minutes after treatment (to make sure it is greater than 70 mg/dL). If your blood sugar is not greater than 70 mg/dL on recheck, call [phone] for  further instructions. Report your blood sugar to the short stay nurse when you get to Short Stay.  If you are admitted to the hospital after surgery: Your blood sugar will be checked by the staff and you will probably be given insulin after surgery (instead of oral diabetes medicines) to make sure you have good blood sugar levels. The goal for blood sugar control after surgery is 80-180 mg/dL.                      Do NOT  Smoke (Tobacco/Vaping) for 24 hours prior to your procedure.  If you use a CPAP at night, you may bring your mask/headgear for your overnight stay.   Contacts, glasses, piercing's, hearing aid's, dentures or partials may not be worn into surgery, please bring cases for these belongings.    For patients admitted to the hospital, discharge time will be determined by your treatment team.   Patients discharged the day of surgery will not be allowed to drive home, and someone needs to stay with them for 24 hours.  SURGICAL WAITING ROOM VISITATION Patients having surgery or a procedure may have no more than 2 support people in the waiting area - these visitors may rotate.   Children under the age of 69 must have an adult with them who is not the patient. If the patient needs to stay at the hospital during part of their recovery, the visitor guidelines for inpatient rooms apply. Pre-op nurse will coordinate an appropriate time for 1 support person to accompany patient in pre-op.  This support person may not rotate.   Please refer to the [facility] website for the visitor guidelines for Inpatients (after your surgery is over and you are in a regular room).    Special instructions:   [facility]- Preparing For Surgery  Before surgery, you can play an important role. Because skin is not sterile, your skin needs to be as free of germs as possible. You can reduce the number of germs on your skin by washing with CHG (chlorahexidine gluconate) Soap before surgery.  CHG is an antiseptic cleaner which kills germs and bonds with the skin to continue killing germs even after washing.    Oral Hygiene is also important to reduce your risk of infection.  Remember - BRUSH YOUR TEETH THE MORNING OF SURGERY WITH YOUR REGULAR TOOTHPASTE  Please do not use if you have an allergy to CHG or antibacterial soaps. If your skin becomes reddened/irritated stop using the CHG.  Do not shave (including legs and underarms)  for at least 48 hours prior to first CHG shower. It is OK to shave your face.  Please follow these instructions carefully.   Shower the NIGHT BEFORE SURGERY and the MORNING OF SURGERY  If you chose to wash your hair, wash your hair first as usual with your normal shampoo.  After you shampoo, rinse your hair and body thoroughly to remove the shampoo.  Use CHG Soap as you would any other liquid soap. You can apply CHG directly to the skin and wash gently with a scrungie or a clean washcloth.   Apply the CHG Soap to your body ONLY FROM THE NECK DOWN.  Do not use on open wounds or open sores. Avoid contact with your eyes, ears, mouth and genitals (private parts). Wash Face and genitals (private parts)  with your normal soap.   Wash thoroughly, paying special attention to the area where your surgery will be performed.  Thoroughly  rinse your body with warm water from the neck down.  DO NOT shower/wash with your normal soap after using and rinsing off the CHG Soap.  Pat yourself dry with a CLEAN TOWEL.  Wear CLEAN PAJAMAS to bed the night before surgery  Place CLEAN SHEETS on your bed the night before your surgery  DO NOT SLEEP WITH PETS.   Day of Surgery: Take a shower with CHG soap. Do not wear jewelry or makeup Do not wear lotions, powders, perfumes/colognes, or deodorant. Do not shave 48 hours prior to surgery.  Men may shave face and neck. Do not bring valuables to the hospital.  [facility] is not responsible for any belongings or valuables. Do not wear nail polish, gel polish, artificial nails, or any other type of covering on natural nails (fingers and toes) If you have artificial nails or gel coating that need to be removed by a nail salon, please have this removed prior to surgery. Artificial nails or gel coating may interfere with anesthesia's ability to adequately monitor your vital signs. Wear Clean/Comfortable clothing the morning of surgery Remember to brush your teeth  WITH YOUR REGULAR TOOTHPASTE.   Please read over the following fact sheets that you were given.    If you received a COVID test during your pre-op visit  it is requested that you wear a mask when out in public, stay away from anyone that may not be feeling well and notify your surgeon if you develop symptoms. If you have been in contact with anyone that has tested positive in the last 10 days please notify you surgeon.

## 2022-03-07 ENCOUNTER — Encounter (HOSPITAL_COMMUNITY)
Admission: RE | Admit: 2022-03-07 | Discharge: 2022-03-07 | Disposition: A | Discharge status: Home / Self Care | Payer: Medicare Other | Source: Ambulatory Visit | Attending: Surgery | Admitting: Surgery

## 2022-03-07 ENCOUNTER — Other Ambulatory Visit: Payer: Self-pay

## 2022-03-07 ENCOUNTER — Encounter (HOSPITAL_COMMUNITY): Payer: Self-pay

## 2022-03-07 VITALS — BP 114/70 | HR 89 | Temp 98.3°F | Resp 17 | Ht 63.0 in | Wt 216.9 lb

## 2022-03-07 DIAGNOSIS — Z01812 Encounter for preprocedural laboratory examination: Secondary | ICD-10-CM | POA: Diagnosis present

## 2022-03-07 DIAGNOSIS — E039 Hypothyroidism, unspecified: Secondary | ICD-10-CM | POA: Insufficient documentation

## 2022-03-07 DIAGNOSIS — Z86010 Personal history of colonic polyps: Secondary | ICD-10-CM | POA: Insufficient documentation

## 2022-03-07 DIAGNOSIS — E785 Hyperlipidemia, unspecified: Secondary | ICD-10-CM | POA: Diagnosis not present

## 2022-03-07 DIAGNOSIS — E1136 Type 2 diabetes mellitus with diabetic cataract: Secondary | ICD-10-CM | POA: Diagnosis not present

## 2022-03-07 DIAGNOSIS — Z8673 Personal history of transient ischemic attack (TIA), and cerebral infarction without residual deficits: Secondary | ICD-10-CM | POA: Diagnosis not present

## 2022-03-07 DIAGNOSIS — K432 Incisional hernia without obstruction or gangrene: Secondary | ICD-10-CM | POA: Diagnosis not present

## 2022-03-07 DIAGNOSIS — Z86718 Personal history of other venous thrombosis and embolism: Secondary | ICD-10-CM | POA: Diagnosis not present

## 2022-03-07 DIAGNOSIS — K219 Gastro-esophageal reflux disease without esophagitis: Secondary | ICD-10-CM | POA: Insufficient documentation

## 2022-03-07 DIAGNOSIS — E1100 Type 2 diabetes mellitus with hyperosmolarity without nonketotic hyperglycemic-hyperosmolar coma (NKHHC): Secondary | ICD-10-CM | POA: Insufficient documentation

## 2022-03-07 DIAGNOSIS — I10 Essential (primary) hypertension: Secondary | ICD-10-CM | POA: Insufficient documentation

## 2022-03-07 LAB — CBC
HCT: 42 % (ref 36.0–46.0)
Hemoglobin: 13.5 g/dL (ref 12.0–15.0)
MCH: 28.2 pg (ref 26.0–34.0)
MCHC: 32.1 g/dL (ref 30.0–36.0)
MCV: 87.9 fL (ref 80.0–100.0)
Platelets: 253 10*3/uL (ref 150–400)
RBC: 4.78 MIL/uL (ref 3.87–5.11)
RDW: 12.8 % (ref 11.5–15.5)
WBC: 7.6 10*3/uL (ref 4.0–10.5)
nRBC: 0 % (ref 0.0–0.2)

## 2022-03-07 LAB — BASIC METABOLIC PANEL
Anion gap: 5 (ref 5–15)
BUN: 17 mg/dL (ref 8–23)
CO2: 31 mmol/L (ref 22–32)
Calcium: 9.5 mg/dL (ref 8.9–10.3)
Chloride: 106 mmol/L (ref 98–111)
Creatinine, Ser: 1.1 mg/dL — ABNORMAL HIGH (ref 0.44–1.00)
GFR, Estimated: 54 mL/min — ABNORMAL LOW (ref >60)
Glucose, Bld: 55 mg/dL — ABNORMAL LOW (ref 70–99)
Potassium: 4.3 mmol/L (ref 3.5–5.1)
Sodium: 142 mmol/L (ref 135–145)

## 2022-03-07 LAB — GLUCOSE, CAPILLARY: Glucose-Capillary: 113 mg/dL — ABNORMAL HIGH (ref 70–99)

## 2022-03-07 LAB — HEMOGLOBIN A1C
Hgb A1c MFr Bld: 9.8 % — ABNORMAL HIGH (ref 4.8–5.6)
Mean Plasma Glucose: 234.56 mg/dL

## 2022-03-07 NOTE — Progress Notes (Addendum)
PCP - Harlan Stains MD Cardiologist - Golden Hurter  PPM/ICD - Denies  Chest x-ray - NI EKG - 03/07/22 Stress Test - Yes 04/2013 ECHO - 11/23/17 Cardiac Cath - Yes no stent placed 04/2013  Sleep Study - Denies  DM - Type I Fasting Blood Sugar - Usually 120-160 CBG at PAT appt 113 Checks Blood Sugar __2-3___ times a day  Aspirin Instructions:Per patient instructed to stop 2 weeks prior to surgery  ERAS Protcol -Yes  Anesthesia review: Yes cardiac history  Patient denies shortness of breath, fever, cough and chest pain at PAT appointment   All instructions explained to the patient, with a verbal understanding of the material. Patient agrees to go over the instructions while at home for a better understanding.  The opportunity to ask questions was provided.  Updated patient instructions for Lantus for her to receive 80% on day of surgery (Type I). Patient no longer taking her duloxetine, updated on MAR.

## 2022-03-08 ENCOUNTER — Encounter (HOSPITAL_COMMUNITY): Payer: Self-pay | Admitting: Vascular Surgery

## 2022-03-08 NOTE — Progress Notes (Signed)
Anesthesia Chart Review:  Case: [id number] Date/Time: 03/10/22 0715   Procedure: HERNIA REPAIR INCISIONAL WITH MESH - GEN AND TAP BLOCK   Anesthesia type: General   Pre-op diagnosis: INCISIONAL HERNIA   Location: [place] [facility] ROOM 02 / [place] [facility]   Surgeons: Erroll Luna, MD       DISCUSSION: Patient is a 71 year old female scheduled for the above procedure.  History includes never smoker, post-operative N/V, HTN, hypothyroidism, HLD, DVT (x2, last acute DVT left soleal vein 11/08/45), diastolic dysfunction, aortic insufficiency (mild AI 11/2017), DM2 (DKA, 11/2017), CVA (11/2017), anemia, GERD, hiatal hernia, back surgeries (L4-5/L5-S1 PLIF 09/01/03; L3-4 PLIF 11/15/11; L2-3 PLIF 11/15/17), neck surgeries (C3-6 ACDF 03/23/17; C6-7 ACDF 04/18/19), bowel surgery (ascending colectomy for villous adenoma, cecum with high grade dysplasia 04/08/16; exploratory lap with small bowel resection, LOA 09/24/20). Normal coronaries by 2014 cath and no hemodynamically significant coronary lesions by 01/13/20 CCTA. BMI is consistent with obesity.   - Preoperative cardiology input outline on 01/18/22 by Morgan Bame, NP. "Preoperative Cardiovascular Risk Assessment: She is doing well from a cardiac perspective and may proceed to surgery without further testing. According to the Revised Cardiac Risk Index (RCRI), her Perioperative Risk of Major Cardiac Event is (%): 6.6. Her Functional Capacity in METs is: 7.59 according to the Duke Activity Status Index (DASI).   Request to hold aspirin will need to be directed to patient's neurologist as she takes aspirin for history of CVA."  - Per neurology letter by [name] dated 01/19/22, "[name] is a established patient under our care at [facility] for treatment of migraines. We have received a medical clearance form to be completed for upcoming procedure for [name]. We do not manage [name]'s Asprin nor have we evaluated for previous history of CVA.  However,  from a neurological standpoint, the patient can hold the aspirin prior to the procedure. She may resume post procedure. It appears that the previous CVA was in 2019."  A1c is elevated at 9.8%. A1c result was called to [facility] triage nurse to have [name] review. Discussed that a glucose significantly elevated on the day of surgery could delay or cancel surgery, although patient reported fasting CBGs are historically 120-160's.   She reported stopping her ASA two weeks prior to surgery.   Anesthesia team to evaluate on the day of surgery. She will get CBG on arrival.     VS: BP 114/70   Pulse 89   Temp 36.8 C (Oral)   Resp 17   Ht '5\' 3"'$  (1.6 m)   Wt 98.4 kg   SpO2 99%   BMI 38.42 kg/m    PROVIDERS: Morgan Stains, MD is PCP  - Morgan Him, MD is cardiologist. . Initially seen in 2014 for chest pain and had an abnormal stress test following by [facility] showing normal coronaries. Last seen by Dr. Radford Pax on 09/04/17 for presyncope with PACs. Echo and event monitor ordered (see below) with as needed follow-up. Last cardiology encounter was for 11/23/17 TEE as part of CVA work-up.  Morgan Rear, MD is endocrinologist - Lomax, Amy, NP is neurology provider   LABS: Preoperative labs noted. See DISCUSSION. (all labs ordered are listed, but only abnormal results are displayed)  Labs Reviewed  GLUCOSE, CAPILLARY - Abnormal; Notable for the following components:      Result Value   Glucose-Capillary 113 (*)    All other components within normal limits  HEMOGLOBIN A1C - Abnormal; Notable for the  following components:   Hgb A1c MFr Bld 9.8 (*)    All other components within normal limits  BASIC METABOLIC PANEL - Abnormal; Notable for the following components:   Glucose, Bld 55 (*)    Creatinine, Ser 1.10 (*)    GFR, Estimated 54 (*)    All other components within normal limits  CBC     IMAGES: CT Abd/pelvis 01/28/22: IMPRESSION: - Prior ileocolic resection with herniation of  transverse colon into a supraumbilical ventral hernia without bowel obstruction. - Tiny umbilical and supraumbilical hernias containing fat. - No acute intra-abdominal or intrapelvic abnormalities. - Aortic Atherosclerosis (ICD10-I70.0).     EKG: 06/25/21: Sinus rhythm Borderline prolonged PR interval Left axis deviation Confirmed by Ronnald Nian, Adam ([id number]) on 07/05/2021 12:17:16 PM    CV: CT Coronary 01/13/20: - Aorta:  Normal size.  Scattered calcifications.  No dissection. - Aortic Valve:  Trileaflet.  No calcifications. - Mitral Valve: Marked posterior mitral annular calcifications. - Coronary Arteries:  Normal coronary origin.  Right dominance.   RCA is a large dominant artery that gives rise to PDA and PLVB. There is mild calcified plaque in the mid RCA with associated stenosis of 25-49% and sequential mid RCA lesion with moderate mixed plaque with positive remodeling with associated stenosis of 50-69%.   Left main is a large artery that gives rise to LAD and LCX arteries. There is no plaque.   LAD is a large vessel that gives rise to a large branching D1. There is minimal calcified plaque in the proximal LAD with associated stenosis of 0-24%. There is mild calcified plaque in the inferior sub branch of the Diagonal with associated stenosis of 25-49%.   LCX is a non-dominant artery that gives rise to one large OM1 branch. There is mild mixed plaque with positive remodeling in the proximal LCx with associated stenosis of 25-49%.   - Other findings: Normal pulmonary vein drainage into the left atrium. Normal let atrial appendage without a thrombus. Normal size of the pulmonary artery.   IMPRESSION: 1. Coronary calcium score of 506. This was 94th percentile for age and sex matched control. 2.  Normal coronary origin with right dominance. 3.  Moderate atherosclerosis.  CAD-RADS=3. 4. Consider symptom-guided anti-ischemic and preventive pharmacotherapy as well as risk  factor modification per guideline-directed care. 5.  Consider functional ischemia assessment. 6.  This study has been submitted for FFR analysis.  FFR 01/13/20: 1. Left Main: No significant stenosis. LM FFR = 0.99.  2. LAD: No significant stenosis. Proximal FFR = 0.99, Mid FFR = 0.95, Distal FFR = 0.85.   3. LCX: No significant stenosis. Proximal FFR = 1.00, Mid FFR = 0.99, Distal FFR = 0.91.   4. RCA: No significant stenosis. Proximal FFR = 0.98, Mid FFR = 0.97, Distal FFR = 0.88.   IMPRESSION: 1. CT FFR flow analysis demonstrates no hemodynamically significant flow limiting lesions.    TEE 11/23/17: Study Conclusions - Left ventricle: The cavity size was normal. Wall thickness was   normal. Systolic function was normal. The estimated ejection   fraction was in the range of 55% to 60%. - Aortic valve: No evidence of vegetation. There was mild   regurgitation. - Mitral valve: No evidence of vegetation. There was mild   regurgitation. - Left atrium: No evidence of thrombus in the appendage. - Right atrium: No evidence of thrombus in the atrial cavity or   appendage. - Atrial septum: No defect or patent foramen ovale was identified.   Echo  contrast study showed no right-to-left atrial level shunt,   following an increase in RA pressure induced by provocative   maneuvers. - Tricuspid valve: No evidence of vegetation. - Pulmonic valve: No evidence of vegetation. - Superior vena cava: The study excluded a thrombus. Impressions: - No cardiac source of emboli was indentified.    Echo (TTE) 09/08/17: Study Conclusions - Left ventricle: The cavity size was normal. Wall thickness was   increased in a pattern of moderate LVH. Systolic function was   vigorous. The estimated ejection fraction was in the range of 65%   to 70%. Wall motion was normal; there were no regional wall   motion abnormalities. Doppler parameters are consistent with   abnormal left ventricular relaxation  (grade 1 diastolic   dysfunction). - Mitral valve: Calcified annulus. - Left atrium: The atrium was mildly dilated.    Cardiac event monitor 09/08/17-10/07/17:  Normal sinus rhtyhm with average heart rate 95bpm. Sinus tachycardia up to 120bpm. Frequent PACs    Cardiac cath 05/03/13: IMPRESSIONS: No angiographically significant CAD Normal left ventricular systolic function.  LVEDP 18 mmHg.  Ejection fraction 60%.    Past Medical History:  Diagnosis Date   Anemia    iron deficiency   Arthritis    Blood transfusion    CAD (coronary artery disease), native coronary artery    coronary CTA showed a coronary Ca score of 506, mild CAD with 25-49% mRCA and moderate CAD with 50-69% sequential mRCA, 25-49% inferior sub branch of D1 and 25-49% in pLCx.     Cervical spondylosis with myelopathy and radiculopathy    CVA (cerebral vascular accident) ([place])    11/2017   Depression    Diabetes mellitus    Type II   Diastolic dysfunction    Diverticulitis    DJD (degenerative joint disease)    DVT (deep venous thrombosis) (HCC)    times 2 lower leg   Endometriosis    Family history of adverse reaction to anesthesia    mom and dad PONV   Fibromyalgia    GERD (gastroesophageal reflux disease)    occ   Hiatal hernia    History of kidney stones    Hyperlipidemia    Hypertension    Hypothyroidism    Insomnia    Mild aortic insufficiency    by echo 04/2013   Osteoarthritis    back and knee   Ovarian cyst    PONV (postoperative nausea and vomiting)    RLS (restless legs syndrome)    Thoracic compression fracture (HCC)    Uterine fibroid    UTI (lower urinary tract infection)    Villous adenoma of right colon 04/08/2016   Vitamin D deficiency disease     Past Surgical History:  Procedure Laterality Date   ANTERIOR CERVICAL DECOMP/DISCECTOMY FUSION N/A 03/23/2017   Procedure: ANTERIOR CERVICAL DECOMPRESSION/DISCECTOMY FUSION, INTERBODY PROSTHESIS,PLATE CERVICAL THREE- CERVICAL FOUR,  CERVICAL FOUR- CERVICAL FIVE, CERVICAL FIVE- CERVICAL SIX;  Surgeon: Newman Pies, MD;  Location: [place];  Service: Neurosurgery;  Laterality: N/A;   ANTERIOR CERVICAL DECOMP/DISCECTOMY FUSION N/A 04/18/2019   Procedure: ANTERIOR CERVICAL DECOMPRESSION/DISCECTOMY  CERVICAL SIX- CERVICAL SEVEN;  Surgeon: Newman Pies, MD;  Location: [place];  Service: Neurosurgery;  Laterality: N/A;  anterior   APPENDECTOMY     BACK SURGERY  2005   April  2013 - spinal fusion[facility]   bowel blockage surgery     BREAST SURGERY     breast reduction   CARDIAC CATHETERIZATION  04/2013  normal coronary arteries and normal LVF   CARPAL TUNNEL RELEASE  06/05/2012   Procedure: CARPAL TUNNEL RELEASE;  Surgeon: Cammie Sickle., MD;  Location: [place];  Service: Orthopedics;  Laterality: Left;   CESAREAN SECTION     x 2   CHOLECYSTECTOMY     COLECTOMY  04/08/2016   DILATION AND CURETTAGE OF UTERUS     DORSAL COMPARTMENT RELEASE  06/05/2012   Procedure: RELEASE DORSAL COMPARTMENT (DEQUERVAIN);  Surgeon: Cammie Sickle., MD;  Location: [facility];  Service: Orthopedics;  Laterality: Left;  Excision of mixoid cyst also   EYE SURGERY     cataracts bilateral   JOINT REPLACEMENT     Bilateral knee   KNEE ARTHROPLASTY  2009   lft partial   KNEE ARTHROPLASTY     rt   LAPAROSCOPIC PARTIAL COLECTOMY N/A 04/08/2016   Procedure: LAPAROSCOPIC ASSISTED ASCENDING COLECTOMY POSSIBLE OPEN COLECTOMY;  Surgeon: Fanny Skates, MD;  Location: [place];  Service: General;  Laterality: N/A;   orthopedic surgeries     multiple   perforated bowl     Sep 09 2020   REDUCTION MAMMAPLASTY Bilateral    SHOULDER OPEN ROTATOR CUFF REPAIR     rt and lft   TEE WITHOUT CARDIOVERSION N/A 11/23/2017   Procedure: TRANSESOPHAGEAL ECHOCARDIOGRAM (TEE);  Surgeon: Jerline Pain, MD;  Location[facility];  Service: Cardiovascular;  Laterality: N/A;   TUBAL LIGATION     btsp    MEDICATIONS:   acetaminophen (TYLENOL) 500 MG tablet   amLODipine (NORVASC) 2.5 MG tablet   aspirin EC 81 MG tablet   cephALEXin (KEFLEX) 500 MG capsule   cetirizine (ZYRTEC) 10 MG tablet   Cholecalciferol (VITAMIN D3) 125 MCG (5000 UT) TABS   Clobetasol Propionate (TEMOVATE) 0.05 % external spray   cyclobenzaprine (FLEXERIL) 10 MG tablet   DULoxetine (CYMBALTA) 30 MG capsule   fluticasone (FLONASE) 50 MCG/ACT nasal spray   furosemide (LASIX) 20 MG tablet   gabapentin (NEURONTIN) 300 MG capsule   Glucagon (GVOKE HYPOPEN 1-PACK) 1 MG/0.2ML SOAJ   HUMALOG 100 UNIT/ML injection   HYDROcodone-acetaminophen (NORCO/VICODIN) 5-325 MG tablet   ibandronate (BONIVA) 150 MG tablet   imipramine (TOFRANIL) 50 MG tablet   insulin glargine (LANTUS) 100 UNIT/ML injection   irbesartan-hydrochlorothiazide (AVALIDE) 300-12.5 MG tablet   levothyroxine (SYNTHROID) 175 MCG tablet   LORazepam (ATIVAN) 0.5 MG tablet   Magnesium 250 MG TABS   meclizine (ANTIVERT) 12.5 MG tablet   metoprolol succinate (TOPROL-XL) 25 MG 24 hr tablet   ondansetron (ZOFRAN ODT) 4 MG disintegrating tablet   pantoprazole (PROTONIX) 40 MG tablet   Potassium 99 MG TABS   Probiotic Product (PROBIOTIC PO)   promethazine (PHENERGAN) 25 MG tablet   rosuvastatin (CRESTOR) 20 MG tablet   temazepam (RESTORIL) 15 MG capsule   vitamin B-12 (CYANOCOBALAMIN) 1000 MCG tablet   zinc sulfate 220 (50 Zn) MG capsule   No current facility-administered medications for this encounter.    Myra Gianotti, PA-C Surgical Short Stay/Anesthesiology [facility] Phone [phone] [facility] Phone [phone] 03/08/2022 12:46 PM

## 2022-03-08 NOTE — Anesthesia Preprocedure Evaluation (Signed)
Anesthesia Evaluation    Airway        Dental   Pulmonary           Cardiovascular hypertension,      Neuro/Psych    GI/Hepatic   Endo/Other  diabetes  Renal/GU      Musculoskeletal   Abdominal   Peds  Hematology   Anesthesia Other Findings   Reproductive/Obstetrics                             Anesthesia Physical Anesthesia Plan  ASA:   Anesthesia Plan:    Post-op Pain Management:    Induction:   PONV Risk Score and Plan:   Airway Management Planned:   Additional Equipment:   Intra-op Plan:   Post-operative Plan:   Informed Consent:   Plan Discussed with:   Anesthesia Plan Comments: (PAT note written 03/08/2022 by Myra Gianotti, PA-C. )        Anesthesia Quick Evaluation

## 2022-03-10 ENCOUNTER — Encounter (HOSPITAL_COMMUNITY): Admission: RE | Payer: Self-pay | Source: Home / Self Care

## 2022-03-10 ENCOUNTER — Ambulatory Visit (HOSPITAL_COMMUNITY): Admission: RE | Admit: 2022-03-10 | Payer: Medicare Other | Source: Home / Self Care | Admitting: Surgery

## 2022-03-10 SURGERY — REPAIR, HERNIA, INCISIONAL
Anesthesia: General

## 2022-03-21 ENCOUNTER — Other Ambulatory Visit: Payer: Self-pay | Admitting: Family Medicine

## 2022-03-21 DIAGNOSIS — Z1231 Encounter for screening mammogram for malignant neoplasm of breast: Secondary | ICD-10-CM

## 2022-03-23 ENCOUNTER — Emergency Department (HOSPITAL_BASED_OUTPATIENT_CLINIC_OR_DEPARTMENT_OTHER): Payer: Medicare Other | Admitting: Radiology

## 2022-03-23 ENCOUNTER — Emergency Department (HOSPITAL_BASED_OUTPATIENT_CLINIC_OR_DEPARTMENT_OTHER)
Admission: EM | Admit: 2022-03-23 | Discharge: 2022-03-23 | Disposition: A | Discharge status: Home / Self Care | Payer: Medicare Other | Attending: Emergency Medicine | Admitting: Emergency Medicine

## 2022-03-23 ENCOUNTER — Encounter (HOSPITAL_BASED_OUTPATIENT_CLINIC_OR_DEPARTMENT_OTHER): Payer: Self-pay

## 2022-03-23 ENCOUNTER — Emergency Department (HOSPITAL_BASED_OUTPATIENT_CLINIC_OR_DEPARTMENT_OTHER): Payer: Medicare Other

## 2022-03-23 ENCOUNTER — Other Ambulatory Visit: Payer: Self-pay

## 2022-03-23 DIAGNOSIS — E119 Type 2 diabetes mellitus without complications: Secondary | ICD-10-CM | POA: Diagnosis not present

## 2022-03-23 DIAGNOSIS — M542 Cervicalgia: Secondary | ICD-10-CM | POA: Insufficient documentation

## 2022-03-23 DIAGNOSIS — Z7982 Long term (current) use of aspirin: Secondary | ICD-10-CM | POA: Diagnosis not present

## 2022-03-23 DIAGNOSIS — Z794 Long term (current) use of insulin: Secondary | ICD-10-CM | POA: Diagnosis not present

## 2022-03-23 DIAGNOSIS — N179 Acute kidney failure, unspecified: Secondary | ICD-10-CM

## 2022-03-23 DIAGNOSIS — R519 Headache, unspecified: Secondary | ICD-10-CM | POA: Insufficient documentation

## 2022-03-23 DIAGNOSIS — R110 Nausea: Secondary | ICD-10-CM | POA: Diagnosis not present

## 2022-03-23 LAB — URINALYSIS, ROUTINE W REFLEX MICROSCOPIC
Bilirubin Urine: NEGATIVE
Glucose, UA: >1000 mg/dL — AB
Hgb urine dipstick: NEGATIVE
Ketones, ur: NEGATIVE mg/dL
Nitrite: NEGATIVE
Protein, ur: NEGATIVE mg/dL
Specific Gravity, Urine: 1.026 (ref 1.005–1.030)
pH: 5 (ref 5.0–8.0)

## 2022-03-23 LAB — CBC
HCT: 42.2 % (ref 36.0–46.0)
Hemoglobin: 13.8 g/dL (ref 12.0–15.0)
MCH: 28.3 pg (ref 26.0–34.0)
MCHC: 32.7 g/dL (ref 30.0–36.0)
MCV: 86.5 fL (ref 80.0–100.0)
Platelets: 262 10*3/uL (ref 150–400)
RBC: 4.88 MIL/uL (ref 3.87–5.11)
RDW: 12.9 % (ref 11.5–15.5)
WBC: 8.2 10*3/uL (ref 4.0–10.5)
nRBC: 0 % (ref 0.0–0.2)

## 2022-03-23 LAB — COMPREHENSIVE METABOLIC PANEL
ALT: 12 U/L (ref 0–44)
AST: 17 U/L (ref 15–41)
Albumin: 4.3 g/dL (ref 3.5–5.0)
Alkaline Phosphatase: 81 U/L (ref 38–126)
Anion gap: 11 (ref 5–15)
BUN: 33 mg/dL — ABNORMAL HIGH (ref 8–23)
CO2: 25 mmol/L (ref 22–32)
Calcium: 9.9 mg/dL (ref 8.9–10.3)
Chloride: 97 mmol/L — ABNORMAL LOW (ref 98–111)
Creatinine, Ser: 1.64 mg/dL — ABNORMAL HIGH (ref 0.44–1.00)
GFR, Estimated: 33 mL/min — ABNORMAL LOW (ref >60)
Glucose, Bld: 147 mg/dL — ABNORMAL HIGH (ref 70–99)
Potassium: 4.5 mmol/L (ref 3.5–5.1)
Sodium: 133 mmol/L — ABNORMAL LOW (ref 135–145)
Total Bilirubin: 0.4 mg/dL (ref 0.3–1.2)
Total Protein: 7.2 g/dL (ref 6.5–8.1)

## 2022-03-23 LAB — CBG MONITORING, ED: Glucose-Capillary: 154 mg/dL — ABNORMAL HIGH (ref 70–99)

## 2022-03-23 LAB — TROPONIN I (HIGH SENSITIVITY): Troponin I (High Sensitivity): 6 ng/L (ref <18)

## 2022-03-23 LAB — LIPASE, BLOOD: Lipase: 32 U/L (ref 11–51)

## 2022-03-23 MED ORDER — IOHEXOL 350 MG/ML SOLN
80.0000 mL | Freq: Once | INTRAVENOUS | Status: AC | PRN
  Administered 2022-03-23: 60 mL via INTRAVENOUS

## 2022-03-23 MED ORDER — METOCLOPRAMIDE HCL 5 MG/ML IJ SOLN
10.0000 mg | Freq: Once | INTRAMUSCULAR | Status: AC
  Administered 2022-03-23: 10 mg via INTRAVENOUS
  Filled 2022-03-23: qty 2

## 2022-03-23 MED ORDER — DIPHENHYDRAMINE HCL 50 MG/ML IJ SOLN
25.0000 mg | Freq: Once | INTRAMUSCULAR | Status: AC
  Administered 2022-03-23: 25 mg via INTRAVENOUS
  Filled 2022-03-23: qty 1

## 2022-03-23 MED ORDER — KETOROLAC TROMETHAMINE 15 MG/ML IJ SOLN
15.0000 mg | Freq: Once | INTRAMUSCULAR | Status: AC
  Administered 2022-03-23: 15 mg via INTRAVENOUS
  Filled 2022-03-23: qty 1

## 2022-03-23 MED ORDER — SODIUM CHLORIDE 0.9 % IV BOLUS
1000.0000 mL | Freq: Once | INTRAVENOUS | Status: AC
Start: 2022-03-23 — End: 2022-03-23
  Administered 2022-03-23: 1000 mL via INTRAVENOUS

## 2022-03-23 MED ORDER — ONDANSETRON HCL 4 MG/2ML IJ SOLN
4.0000 mg | Freq: Once | INTRAMUSCULAR | Status: AC | PRN
  Administered 2022-03-23: 4 mg via INTRAVENOUS

## 2022-03-23 NOTE — ED Provider Notes (Signed)
[facility] Provider Note   CSN: [id number] Arrival date & time: 03/23/22  1609     History  Chief Complaint  Patient presents with   Nausea    [name] is a 71 y.o. female history of diabetes, abdominal hernia, here presenting with nausea and headache and neck pain.  Patient states that she was scheduled for hernia repair but was canceled due to her high A1c level.  She has been in structure did not take her aspirin for about a month now.  She states that she had sudden onset of headache and neck pain earlier today.  She also felt nauseated.  The history is provided by the patient.       Home Medications Prior to Admission medications   Medication Sig Start Date End Date Taking? Authorizing Provider  acetaminophen (TYLENOL) 500 MG tablet Take 1,000 mg by mouth every 8 (eight) hours as needed for mild pain or headache.    [provider]  amLODipine (NORVASC) 2.5 MG tablet Take 2 tablets (5 mg total) by mouth daily. Patient taking differently: Take 2.5 mg by mouth in the morning. 05/25/21   Sueanne Margarita, MD  aspirin EC 81 MG tablet Take 81 mg by mouth daily.    [provider]  cephALEXin (KEFLEX) 500 MG capsule Take 1 capsule (500 mg total) by mouth 4 (four) times daily. Patient not taking: Reported on 02/28/2022 01/28/22   Azucena Cecil, PA-C  cetirizine (ZYRTEC) 10 MG tablet Take 10 mg by mouth at bedtime.    [provider]  Cholecalciferol (VITAMIN D3) 125 MCG (5000 UT) TABS Take 5,000 Units by mouth in the morning.    [provider]  Clobetasol Propionate (TEMOVATE) 0.05 % external spray Apply 1 spray topically at bedtime as needed (scalp and ear irritation.).    [provider]  cyclobenzaprine (FLEXERIL) 10 MG tablet Take 1 tablet (10 mg total) by mouth 3 (three) times daily as needed for muscle spasms. 04/19/19   Viona Gilmore D, NP  DULoxetine (CYMBALTA) 30 MG capsule Take 30 mg by  mouth in the morning. Patient not taking: Reported on 03/07/2022 08/19/21   [provider]  fluticasone (FLONASE) 50 MCG/ACT nasal spray Place 2 sprays into both nostrils daily as needed for allergies or rhinitis.    [provider]  furosemide (LASIX) 20 MG tablet TAKE 1 TABLET (20 MG TOTAL) BY MOUTH DAILY AS NEEDED FOR FLUID OR EDEMA. 09/21/21   Sueanne Margarita, MD  gabapentin (NEURONTIN) 300 MG capsule Take 900 mg by mouth at bedtime.    [provider]  Glucagon (GVOKE HYPOPEN 1-PACK) 1 MG/0.2ML SOAJ Inject 1 mg into the skin as needed (hypoglycemia).    [provider]  HUMALOG 100 UNIT/ML injection Inject 12-15 Units into the skin with breakfast, with lunch, and with evening meal. 02/03/22   [provider]  HYDROcodone-acetaminophen (NORCO/VICODIN) 5-325 MG tablet Take 1 tablet by mouth every 6 (six) hours as needed for severe pain. Patient not taking: Reported on 02/28/2022 01/28/22   Azucena Cecil, PA-C  ibandronate (BONIVA) 150 MG tablet Take 150 mg by mouth every 30 (thirty) days. 03/06/19   [provider]  imipramine (TOFRANIL) 50 MG tablet Take 1 tablet (50 mg total) by mouth daily. Patient taking differently: Take 50 mg by mouth at bedtime. 01/11/22   Lomax, Amy, NP  insulin glargine (LANTUS) 100 UNIT/ML injection Inject 36 Units into the skin in the morning.  [provider]  irbesartan-hydrochlorothiazide (AVALIDE) 300-12.5 MG tablet Take 1 tablet by mouth in the morning.    [provider]  levothyroxine (SYNTHROID) 175 MCG tablet Take 175 mcg by mouth daily before breakfast. 10/10/17   [provider]  LORazepam (ATIVAN) 0.5 MG tablet Take 0.5-1 mg by mouth every 8 (eight) hours as needed for anxiety. 03/15/19   [provider]  Magnesium 250 MG TABS Take 250 mg by mouth in the morning.    [provider]  meclizine (ANTIVERT) 12.5 MG tablet Take 25 mg by mouth 3 (three) times daily as  needed for dizziness.    [provider]  metoprolol succinate (TOPROL-XL) 25 MG 24 hr tablet Take 1 tablet (25 mg total) by mouth daily. 05/25/21   Sueanne Margarita, MD  ondansetron (ZOFRAN ODT) 4 MG disintegrating tablet Take 1 tablet (4 mg total) by mouth every 8 (eight) hours as needed for nausea or vomiting. 12/02/19   Deno Etienne, DO  pantoprazole (PROTONIX) 40 MG tablet Take 40 mg by mouth daily before breakfast. 07/03/19   [provider]  Potassium 99 MG TABS Take 99 mg by mouth in the morning.    [provider]  Probiotic Product (PROBIOTIC PO) Take 1 capsule by mouth in the morning.    [provider]  promethazine (PHENERGAN) 25 MG tablet Take 25 mg by mouth every 8 (eight) hours as needed for nausea or vomiting.    [provider]  rosuvastatin (CRESTOR) 20 MG tablet Take 20 mg by mouth in the morning.    [provider]  temazepam (RESTORIL) 15 MG capsule Take 15 mg by mouth at bedtime.  10/10/17   [provider]  vitamin B-12 (CYANOCOBALAMIN) 1000 MCG tablet Take 1,000 mcg by mouth in the morning.    [provider]  zinc sulfate 220 (50 Zn) MG capsule Take 220 mg by mouth in the morning.    [provider]      Allergies    Dilaudid [hydromorphone hcl], Heparin, Ambien [zolpidem tartrate], Belsomra [suvorexant], Codeine, Morphine and related, Penicillins, Statins, Ceftin [cefuroxime axetil], Elavil [amitriptyline], Lovaza [omega-3-acid ethyl esters], Lunesta [eszopiclone], Lyrica [pregabalin], and Metformin and related    Review of Systems   Review of Systems  Neurological:  Positive for headaches.  All other systems reviewed and are negative.   Physical Exam Updated Vital Signs BP 136/71   Pulse 75   Temp 98.1 F (36.7 C) (Oral)   Resp 17   Ht '5\' 3"'$  (1.6 m)   Wt 98.4 kg   SpO2 92%   BMI 38.43 kg/m  Physical Exam Vitals and nursing note reviewed.  Constitutional:      Comments:  Uncomfortable  HENT:     Head: Normocephalic.     Nose: Nose normal.     Mouth/Throat:     Mouth: Mucous membranes are dry.  Eyes:     Extraocular Movements: Extraocular movements intact.     Pupils: Pupils are equal, round, and reactive to light.  Cardiovascular:     Rate and Rhythm: Normal rate and regular rhythm.     Pulses: Normal pulses.     Heart sounds: Normal heart sounds.  Pulmonary:     Effort: Pulmonary effort is normal.     Breath sounds: Normal breath sounds.  Abdominal:     General: Abdomen is flat.     Palpations: Abdomen is soft.  Musculoskeletal:        General: Normal  range of motion.     Cervical back: Normal range of motion and neck supple.  Skin:    General: Skin is warm.     Capillary Refill: Capillary refill takes less than 2 seconds.  Neurological:     Comments: Cranial nerves II to XII intact.  Patient has normal strength and sensation bilateral arms and legs  Psychiatric:        Mood and Affect: Mood normal.     ED Results / Procedures / Treatments   Labs (all labs ordered are listed, but only abnormal results are displayed) Labs Reviewed  COMPREHENSIVE METABOLIC PANEL - Abnormal; Notable for the following components:      Result Value   Sodium 133 (*)    Chloride 97 (*)    Glucose, Bld 147 (*)    BUN 33 (*)    Creatinine, Ser 1.64 (*)    GFR, Estimated 33 (*)    All other components within normal limits  URINALYSIS, ROUTINE W REFLEX MICROSCOPIC - Abnormal; Notable for the following components:   Glucose, UA >1,000 (*)    Leukocytes,Ua SMALL (*)    Bacteria, UA RARE (*)    All other components within normal limits  CBG MONITORING, ED - Abnormal; Notable for the following components:   Glucose-Capillary 154 (*)    All other components within normal limits  LIPASE, BLOOD  CBC  TROPONIN I (HIGH SENSITIVITY)  TROPONIN I (HIGH SENSITIVITY)    EKG EKG Interpretation  Date/Time:  Wednesday March 23 2022 16:15:45 EDT Ventricular Rate:   148 PR Interval:    QRS Duration: 60 QT Interval:  186 QTC Calculation: 292 R Axis:   0 Text Interpretation: sinus rhythm Indeterminate axis Pulmonary disease pattern Marked ST abnormality, possible inferolateral subendocardial injury Abnormal ECG When compared with ECG of 05-Jul-2021 11:49, PREVIOUS ECG IS PRESENT Confirmed by Wandra Arthurs [phone]) on 03/23/2022 5:23:10 PM  Radiology DG Chest 2 View  Result Date: 03/23/2022 CLINICAL DATA:  Nausea EXAM: CHEST - 2 VIEW COMPARISON:  None Available. FINDINGS: Anterior cervical fusion. Normal mediastinum and cardiac silhouette. Normal pulmonary vasculature. No evidence of effusion, infiltrate, or pneumothorax. No acute bony abnormality. Posterior lumbar fusion. IMPRESSION: No acute cardiopulmonary process. Electronically Signed   By: Suzy Bouchard M.D.   On: 03/23/2022 16:51    Procedures Procedures    Medications Ordered in ED Medications  metoCLOPramide (REGLAN) injection 10 mg (10 mg Intravenous Not Given 03/23/22 1757)  diphenhydrAMINE (BENADRYL) injection 25 mg (25 mg Intravenous Not Given 03/23/22 1757)  ondansetron (ZOFRAN) injection 4 mg (4 mg Intravenous Given 03/23/22 1631)  sodium chloride 0.9 % bolus 1,000 mL (1,000 mLs Intravenous New Bag/Given 03/23/22 1756)  iohexol (OMNIPAQUE) 350 MG/ML injection 80 mL (60 mLs Intravenous Contrast Given 03/23/22 1826)    ED Course/ Medical Decision Making/ A&P                           Medical Decision Making [name] is a 71 y.o. female here presenting with headache.  Concern for possible dissection versus subarachnoid hemorrhage.  However I think likely migraines.  Plan to get CTA head and neck.  We will also get labs and give migraine cocktail  7:35 PM I reviewed patient's labs and independently interpreted CTA.  Patient has mild AKI with creatinine of 1.6.  CTA unremarkable.  Patient was given migraine cocktail and felt slightly better.  She states that Toradol usually helps with  her headache.  I told her that she has mild AKI can can only give low-dose Toradol.  Patient actually has history of migraines and follows up with neurology.  I recommend that she talk to her neurologist regarding her headaches.  She also may benefit from restarting her baby aspirin daily.  Problems Addressed: AKI (acute kidney injury) ([place]): acute illness or injury Nonintractable headache, unspecified chronicity pattern, unspecified headache type: acute illness or injury  Amount and/or Complexity of Data Reviewed Labs: ordered. Decision-making details documented in ED Course. Radiology: ordered and independent interpretation performed. Decision-making details documented in ED Course.  Risk Prescription drug management.    Final Clinical Impression(s) / ED Diagnoses Final diagnoses:  None    Rx / DC Orders ED Discharge Orders     None         Drenda Freeze, MD 03/23/22 [phone]

## 2022-03-23 NOTE — ED Notes (Signed)
Patient transported to CT 

## 2022-03-23 NOTE — Discharge Instructions (Addendum)
Please stay hydrated.   Continue taking your current medicines for headache.  Consider adding back your aspirin as it will help your headache.  Please follow-up with your neurologist.   You need to have your kidney function rechecked with your doctor in a week  Return to ER if you have worse headache, neck pain, chest pain

## 2022-03-23 NOTE — ED Triage Notes (Signed)
Patient here POV from Home with Family.  Endorses Sudden Onset of Nausea that began approximately 1.5 Hours ago. Associated with Severe Head Pain that was in Jaw and Neck. Some Epigastric ABD Pain. Flushed Sensation.  NAD Noted during Triage. A&Ox4. GCS 15. BIB Wheelchair.

## 2022-03-30 NOTE — Patient Instructions (Signed)
Below is our plan:  We will continue imipramine '50mg'$  daily. Please monitor headaches closely. Please call Dr Jenkins/Megan/Dr Davy Pique to ask about being seen for neck/back pain.   Please make sure you are staying well hydrated. I recommend 50-60 ounces daily. Well balanced diet and regular exercise encouraged. Consistent sleep schedule with 6-8 hours recommended.   Please continue follow up with care team as directed.   Follow up with me in 1 year   You may receive a survey regarding today's visit. I encourage you to leave honest feed back as I do use this information to improve patient care. Thank you for seeing me today!

## 2022-03-30 NOTE — Progress Notes (Signed)
PATIENT: [name] DOB: 1951/02/21  REASON FOR VISIT: follow up HISTORY FROM: patient  Chief Complaint  Patient presents with   Follow-up    Pt alone, rm 10. Follow up from ED referral on 03/23/22- follows here for headache management. She thinks that the headache she has is stemming from the back and it is going up through the neck and top of head. She followed with a dentist for jaw pain bilaterally and they ruled out TMJ. She had a CT scan of head.     HISTORY OF PRESENT ILLNESS:  04/04/2022 ALL: [name] returns for acute visit for an intractable migraine 8/16. She was seen by ER for nausea, headache and neck pain. Migraine cocktail given with little benefit. Low dose Toradol given d/t AKI. Creatine 1.64. She reported hernia repair was cancelled due to elevated A1C of 9.8.   She was last seen 01/2022 and reported migraines were well managed on imipramine '50mg'$  QHS and Tylenol PRN. She reports that headaches are usually well managed. She has had more, recently, but contributes this to back pain.   She is having significant back and neck pain. She is s/p anterior cervical and lumbar fusion with Dr Arnoldo Morale. She has also been seeing Dr Davy Pique for pain management. She reports pain starts in the upper back, radiates to the neck and top of her head. She is taking gabapentin '900mg'$  at bedtime. Also on duloxetine '30mg'$  daily. She has been taking cyclobenzaprine '10mg'$  daily but is not sure it is helping. She has pain with flexion and extension of her neck and back/neck is tender to touch. No obvious triggers or injuries to cause pain.   01/11/2022 ALL: [name] returns for follow up for migraines. She continues imipramine '50mg'$  QHS. She continues to feel migraines are well managed. She rarely has a headache. Tylenol works well for abortive therapy. She continues to work with care team for chronic comorbidities.   01/14/2021 ALL: [name] returns for follow up for migraines. She continues imipramine '50mg'$  at  bedtime. She reports headaches are significantly improved. She may take Tylenol a couple times a month and usually aborts headache. She had emergency surgery in 09/2019 for perforated bowel with sepsis. She is recovering well. She is followed closely by GI and PCP. She had the flu last week.   07/16/2020 MM: [name] is a 71 year old female with a history of migraine headaches.  She returns today for follow-up.  She is currently on imipramine 50 mg at bedtime.  She states that since she started this medication she has not had any headaches.  She states that has been working  well for her.  She denies any significant side effects.  She returns today for an evaluation.  04/14/2020 ALL: [name] is a 71 y.o. female here today for follow up for migraines. She was unable to afford Ajovy or Amovig. We have not tried [employer]. She continues to have daily headaches. She feels a lot are triggered by chronic sinus infections. She admits that blood sugars are up and down. Stress definitely contributes. She has chronic pain. She is no longer on opioid therapy. She continues gabapentin and duloxetine.   She does snore. She feels that this is related to her having chronic sinus issues. She occasionally wakes with headache but feels headaches more frequently get worse throughout the day. She denies frequent waking. She denies excessive daytime sleepiness. She denies waking with dry mouth.   She continues regular follow up with PCP,  pain management and endocrinology. She is on daily meal replacement insulin as well as daily basil insulin. CBGs "are up and down". Fasting reading this morning was 200. Last A1C 10. BP usually well managed. She continues metoprolol.    HISTORY: (copied from my note on 09/10/2019)  [name] is a 71 y.o. female here today for follow up for new onset headaches. She was started on Ajovy for concerns of daily headaches with migrainous features. MRI normal. She reports that headaches  improved in December. She repeated injection in January and does not feel that it was as effective. She has had trouble with sinus infection, sore throat, allergy symptoms and feels that has contributed as well. She no longer has daily headaches. She feels that she had a headache at least 10-15 days last month. Still has a lot of stress. CBG's shot up around 400 after taking doxycycline and prednisone, course completed last week. BP is up and down. She has not taken her BP meds today because she has not felt well. She is having neck pain, s/p ACDF in 04/2019. She continues to follow with Dr Maryjean Ka with [facility]. She has had to take oxycodone more recently due to neck pain.     HISTORY: (copied from Dr Cathren Laine note on 07/10/2019)   HPI:  [name] is a 71 y.o. female here as requested by Harlan Stains, MD for headache.  Past medical history hypertension, DVT on Xarelto, ACDF cervical, diabetes, fibromyalgia, aortic insufficiency.  Being sent for headache.  Patient was seen in the emergency room in July of this year with elevated high blood pressure complaining of headache on the top of the head in the setting of blood pressure 180s over 90s throughout the day, it was 198/100 before she took her blood pressure medications but never significantly decreased, no other focal neurologic symptoms, chronic pain in her neck, she was taking Mucinex which may have been contributing to her hypertension.  CT of the head was negative.  Hydralazine was given, suspected headache due to blood pressure elevation, patient was feeling better when blood pressure improved to the 150s to the 160s.   Mother and grandmother had headaches. Headaches recently started 7-8 months ago. No inciting events, no new medications or head trauma or anything she can tie it back to. Gradually started. [place] trigers. She has pain in the face behind the eyes and in the sinuses, she was told she had sinusitis but Dr. Redmond Baseman ENT said she  didn't. The pain comes behind her head, up the neck, and radiates to the temples, feels liks her head is bouncing, pounding and pulsating and throbbing, dull ache, she recently had an ACDF and no changes to the headache since then, not better or worse, she wakes up with the headaches every day, not overly tired during the day, she has headaches every day, she has tylenol, advil, oxycodone, flexeril, lorazepam, heat, ice, no difference, she reports no significant vision changes, the headache is positional and exertional and bending over for example made her hurt and get dizzy.    Reviewed notes, labs and imaging from outside physicians, which showed:   Personally reviewed CT of the head report February 26, 2019 which showed unremarkable brain.   REVIEW OF SYSTEMS: Out of a complete 14 system review of symptoms, the patient complains only of the following symptoms, chronic headaches, chronic sinusitis, chronic pain, fatigue, anxiety, and all other reviewed systems are negative.   ALLERGIES: Allergies  Allergen Reactions  Dilaudid [Hydromorphone Hcl] Other (See Comments)    Hallucinations/ argumentative, goes beserk   Heparin Other (See Comments)    HIT ab positive  (SRA negative 11/25/17)    Ambien [Zolpidem Tartrate] Other (See Comments)    Up sleep walking and eating   Belsomra [Suvorexant]     ineffective Other reaction(s): Other ineffective   Codeine Nausea And Vomiting   Morphine And Related Other (See Comments)    Flushing and feeling hot Tolerates with Diphenhydramine   Penicillins Rash and Other (See Comments)    Caused Headaches also Has patient had a PCN reaction causing immediate rash, facial/tongue/throat swelling, SOB or lightheadedness with hypotension: No Has patient had a PCN reaction causing severe rash involving mucus membranes or skin necrosis: No Has patient had a PCN reaction that required hospitalization No Has patient had a PCN reaction occurring within the last 10  years: No If all of the above answers are "NO", then may proceed with Cephalosporin use.     Statins Other (See Comments)    Muscle weakness, cramps and aching all over body   Ceftin [Cefuroxime Axetil] Other (See Comments)    Stomach pain    Elavil [Amitriptyline] Other (See Comments)    Loopy feeling   Lovaza [Omega-3-Acid Ethyl Esters] Other (See Comments)    Stomach pain    Lunesta [Eszopiclone] Other (See Comments)    Causes dizziness   Lyrica [Pregabalin] Nausea Only   Metformin And Related Nausea Only    HOME MEDICATIONS: Outpatient Medications Prior to Visit  Medication Sig Dispense Refill   acetaminophen (TYLENOL) 500 MG tablet Take 1,000 mg by mouth every 8 (eight) hours as needed for mild pain or headache.     amLODipine (NORVASC) 2.5 MG tablet Take 2 tablets (5 mg total) by mouth daily. (Patient taking differently: Take 2.5 mg by mouth in the morning.) 90 tablet 3   aspirin EC 81 MG tablet Take 81 mg by mouth daily.     cephALEXin (KEFLEX) 500 MG capsule Take 1 capsule (500 mg total) by mouth 4 (four) times daily. 28 capsule 0   cetirizine (ZYRTEC) 10 MG tablet Take 10 mg by mouth at bedtime.     Cholecalciferol (VITAMIN D3) 125 MCG (5000 UT) TABS Take 5,000 Units by mouth in the morning.     Clobetasol Propionate (TEMOVATE) 0.05 % external spray Apply 1 spray topically at bedtime as needed (scalp and ear irritation.).     cyclobenzaprine (FLEXERIL) 10 MG tablet Take 1 tablet (10 mg total) by mouth 3 (three) times daily as needed for muscle spasms. 30 tablet 0   DULoxetine (CYMBALTA) 30 MG capsule Take 30 mg by mouth in the morning.     fluticasone (FLONASE) 50 MCG/ACT nasal spray Place 2 sprays into both nostrils daily as needed for allergies or rhinitis.     furosemide (LASIX) 20 MG tablet TAKE 1 TABLET (20 MG TOTAL) BY MOUTH DAILY AS NEEDED FOR FLUID OR EDEMA. 90 tablet 2   gabapentin (NEURONTIN) 300 MG capsule Take 900 mg by mouth at bedtime.     Glucagon (GVOKE  HYPOPEN 1-PACK) 1 MG/0.2ML SOAJ Inject 1 mg into the skin as needed (hypoglycemia).     HUMALOG 100 UNIT/ML injection Inject 12-15 Units into the skin with breakfast, with lunch, and with evening meal.     HYDROcodone-acetaminophen (NORCO/VICODIN) 5-325 MG tablet Take 1 tablet by mouth every 6 (six) hours as needed for severe pain. 10 tablet 0   ibandronate (BONIVA) 150 MG  tablet Take 150 mg by mouth every 30 (thirty) days.     imipramine (TOFRANIL) 50 MG tablet Take 1 tablet (50 mg total) by mouth daily. (Patient taking differently: Take 50 mg by mouth at bedtime.) 90 tablet 3   insulin glargine (LANTUS) 100 UNIT/ML injection Inject 44 Units into the skin in the morning.     irbesartan-hydrochlorothiazide (AVALIDE) 300-12.5 MG tablet Take 1 tablet by mouth in the morning.     levothyroxine (SYNTHROID) 175 MCG tablet Take 175 mcg by mouth daily before breakfast.  5   LORazepam (ATIVAN) 0.5 MG tablet Take 0.5-1 mg by mouth every 8 (eight) hours as needed for anxiety.     Magnesium 250 MG TABS Take 250 mg by mouth in the morning.     meclizine (ANTIVERT) 12.5 MG tablet Take 25 mg by mouth 3 (three) times daily as needed for dizziness.     metoprolol succinate (TOPROL-XL) 25 MG 24 hr tablet Take 1 tablet (25 mg total) by mouth daily. 90 tablet 3   ondansetron (ZOFRAN ODT) 4 MG disintegrating tablet Take 1 tablet (4 mg total) by mouth every 8 (eight) hours as needed for nausea or vomiting. 20 tablet 0   pantoprazole (PROTONIX) 40 MG tablet Take 40 mg by mouth daily before breakfast.     Potassium 99 MG TABS Take 99 mg by mouth in the morning.     Probiotic Product (PROBIOTIC PO) Take 1 capsule by mouth in the morning.     promethazine (PHENERGAN) 25 MG tablet Take 25 mg by mouth every 8 (eight) hours as needed for nausea or vomiting.     rosuvastatin (CRESTOR) 20 MG tablet Take 20 mg by mouth in the morning.     temazepam (RESTORIL) 15 MG capsule Take 15 mg by mouth at bedtime.   4   vitamin B-12  (CYANOCOBALAMIN) 1000 MCG tablet Take 1,000 mcg by mouth in the morning.     zinc sulfate 220 (50 Zn) MG capsule Take 220 mg by mouth in the morning.     No facility-administered medications prior to visit.    PAST MEDICAL HISTORY: Past Medical History:  Diagnosis Date   Anemia    iron deficiency   Arthritis    Blood transfusion    CAD (coronary artery disease), native coronary artery    coronary CTA showed a coronary Ca score of 506, mild CAD with 25-49% mRCA and moderate CAD with 50-69% sequential mRCA, 25-49% inferior sub branch of D1 and 25-49% in pLCx.     Cervical spondylosis with myelopathy and radiculopathy    CVA (cerebral vascular accident) ([place])    11/2017   Depression    Diabetes mellitus    Type II   Diastolic dysfunction    Diverticulitis    DJD (degenerative joint disease)    DVT (deep venous thrombosis) ([facility])    times 2 lower leg   Endometriosis    Family history of adverse reaction to anesthesia    mom and dad PONV   Fibromyalgia    GERD (gastroesophageal reflux disease)    occ   Hiatal hernia    History of kidney stones    Hyperlipidemia    Hypertension    Hypothyroidism    Insomnia    Mild aortic insufficiency    by echo 04/2013   Osteoarthritis    back and knee   Ovarian cyst    PONV (postoperative nausea and vomiting)    RLS (restless legs syndrome)  Thoracic compression fracture [facility])    Uterine fibroid    UTI (lower urinary tract infection)    Villous adenoma of right colon 04/08/2016   Vitamin D deficiency disease     PAST SURGICAL HISTORY: Past Surgical History:  Procedure Laterality Date   ANTERIOR CERVICAL DECOMP/DISCECTOMY FUSION N/A 03/23/2017   Procedure: ANTERIOR CERVICAL DECOMPRESSION/DISCECTOMY FUSION, INTERBODY PROSTHESIS,PLATE CERVICAL THREE- CERVICAL FOUR, CERVICAL FOUR- CERVICAL FIVE, CERVICAL FIVE- CERVICAL SIX;  Surgeon: Newman Pies, MD;  Location: [place];  Service: Neurosurgery;  Laterality: N/A;   ANTERIOR CERVICAL  DECOMP/DISCECTOMY FUSION N/A 04/18/2019   Procedure: ANTERIOR CERVICAL DECOMPRESSION/DISCECTOMY  CERVICAL SIX- CERVICAL SEVEN;  Surgeon: Newman Pies, MD;  Location: [place];  Service: Neurosurgery;  Laterality: N/A;  anterior   APPENDECTOMY     BACK SURGERY  2005   April  2013 - spinal fusion[facility]   bowel blockage surgery     BREAST SURGERY     breast reduction   CARDIAC CATHETERIZATION  04/2013   normal coronary arteries and normal LVF   CARPAL TUNNEL RELEASE  06/05/2012   Procedure: CARPAL TUNNEL RELEASE;  Surgeon: Cammie Sickle., MD;  Location: [place];  Service: Orthopedics;  Laterality: Left;   CESAREAN SECTION     x 2   CHOLECYSTECTOMY     COLECTOMY  04/08/2016   DILATION AND CURETTAGE OF UTERUS     DORSAL COMPARTMENT RELEASE  06/05/2012   Procedure: RELEASE DORSAL COMPARTMENT (DEQUERVAIN);  Surgeon: Cammie Sickle., MD;  Location: [facility];  Service: Orthopedics;  Laterality: Left;  Excision of mixoid cyst also   EYE SURGERY     cataracts bilateral   JOINT REPLACEMENT     Bilateral knee   KNEE ARTHROPLASTY  2009   lft partial   KNEE ARTHROPLASTY     rt   LAPAROSCOPIC PARTIAL COLECTOMY N/A 04/08/2016   Procedure: LAPAROSCOPIC ASSISTED ASCENDING COLECTOMY POSSIBLE OPEN COLECTOMY;  Surgeon: Fanny Skates, MD;  Location: [place];  Service: General;  Laterality: N/A;   orthopedic surgeries     multiple   perforated bowl     Sep 09 2020   REDUCTION MAMMAPLASTY Bilateral    SHOULDER OPEN ROTATOR CUFF REPAIR     rt and lft   TEE WITHOUT CARDIOVERSION N/A 11/23/2017   Procedure: TRANSESOPHAGEAL ECHOCARDIOGRAM (TEE);  Surgeon: Jerline Pain, MD;  Location: [facility] ENDOSCOPY;  Service: Cardiovascular;  Laterality: N/A;   TUBAL LIGATION     btsp    FAMILY HISTORY: Family History  Problem Relation Age of Onset   Heart attack Father        26s   Hypothyroidism Father    Diabetes type II Father    Kidney failure Father    Diabetes  type II Mother    Hypertension Mother    Breast cancer Mother 61   Dementia Mother    Hypothyroidism Brother    Diabetes Brother    Breast cancer Maternal Aunt    Breast cancer Cousin    Breast cancer Maternal Aunt    Breast cancer Cousin    High blood pressure Other        "all"   Heart attack Brother    Lung cancer Brother     SOCIAL HISTORY: Social History   Socioeconomic History   Marital status: Married    Spouse name: Marden Noble / Roselie Awkward   Number of children: 2   Years of education: trade school   Highest education level: Not  on file  Occupational History   Not on file  Tobacco Use   Smoking status: Never   Smokeless tobacco: Never  Vaping Use   Vaping Use: Never used  Substance and Sexual Activity   Alcohol use: Never   Drug use: Never   Sexual activity: Not Currently    Birth control/protection: Post-menopausal, Surgical  Other Topics Concern   Not on file  Social History Narrative   Lives at home with her husband   Right handed   Caffeine: 2-3 cups of coffee/day   Social Determinants of Health   Financial Resource Strain: Not on file  Food Insecurity: Not on file  Transportation Needs: Not on file  Physical Activity: Not on file  Stress: Not on file  Social Connections: Not on file  Intimate Partner Violence: Not on file      PHYSICAL EXAM  Vitals:   04/04/22 0929  BP: 112/66  Pulse: 97  Weight: 213 lb (96.6 kg)  Height: '5\' 3"'$  (1.6 m)      Body mass index is 37.73 kg/m.  Generalized: Well developed, in no acute distress  Cardiology: normal rate and rhythm, no murmur noted Respiratory: clear to auscultation bilaterally  Neurological examination  Mentation: Alert oriented to time, place, history taking. Follows all commands speech and language fluent Cranial nerve II-XII: Pupils were equal round reactive to light. Extraocular movements were full, visual field were full on confrontational test. Facial sensation and strength were normal.  Uvula tongue midline. Head turning and shoulder shrug  were normal and symmetric. Motor: The motor testing reveals 5 over 5 strength of all 4 extremities. Good symmetric motor tone is noted throughout.  Gait and station: Gait is normal.   She reports significant tenderness with palpation of paracervical spinal and trapezius muscles. Pain reported with flexion and extension of neck.    DIAGNOSTIC DATA (LABS, IMAGING, TESTING) - I reviewed patient records, labs, notes, testing and imaging myself where available.      No data to display           Lab Results  Component Value Date   WBC 8.2 03/23/2022   HGB 13.8 03/23/2022   HCT 42.2 03/23/2022   MCV 86.5 03/23/2022   PLT 262 03/23/2022      Component Value Date/Time   NA 133 (L) 03/23/2022 1630   NA 133 (L) 02/18/2020 1119   K 4.5 03/23/2022 1630   CL 97 (L) 03/23/2022 1630   CO2 25 03/23/2022 1630   GLUCOSE 147 (H) 03/23/2022 1630   BUN 33 (H) 03/23/2022 1630   BUN 14 02/18/2020 1119   CREATININE 1.64 (H) 03/23/2022 1630   CALCIUM 9.9 03/23/2022 1630   PROT 7.2 03/23/2022 1630   PROT 6.5 07/10/2019 1442   ALBUMIN 4.3 03/23/2022 1630   ALBUMIN 4.1 07/10/2019 1442   AST 17 03/23/2022 1630   ALT 12 03/23/2022 1630   ALKPHOS 81 03/23/2022 1630   BILITOT 0.4 03/23/2022 1630   BILITOT 0.4 07/10/2019 1442   GFRNONAA 33 (L) 03/23/2022 1630   GFRAA 73 02/18/2020 1119   Lab Results  Component Value Date   CHOL 162 03/25/2020   HDL 48 03/25/2020   LDLCALC 94 03/25/2020   TRIG 109 03/25/2020   CHOLHDL 3.4 03/25/2020   Lab Results  Component Value Date   HGBA1C 9.8 (H) 03/07/2022   No results found for: "VITAMINB12" Lab Results  Component Value Date   TSH 1.310 07/10/2019       ASSESSMENT AND  PLAN 71 y.o. year old female  has a past medical history of Anemia, Arthritis, Blood transfusion, CAD (coronary artery disease), native coronary artery, Cervical spondylosis with myelopathy and radiculopathy, CVA (cerebral  vascular accident) ([place]), Depression, Diabetes mellitus, Diastolic dysfunction, Diverticulitis, DJD (degenerative joint disease), DVT (deep venous thrombosis) ([place]), Endometriosis, Family history of adverse reaction to anesthesia, Fibromyalgia, GERD (gastroesophageal reflux disease), Hiatal hernia, History of kidney stones, Hyperlipidemia, Hypertension, Hypothyroidism, Insomnia, Mild aortic insufficiency, Osteoarthritis, Ovarian cyst, PONV (postoperative nausea and vomiting), RLS (restless legs syndrome), Thoracic compression fracture ([place]), Uterine fibroid, UTI (lower urinary tract infection), Villous adenoma of right colon (04/08/2016), and Vitamin D deficiency disease. here with     ICD-10-CM   1. Chronic migraine without aura without status migrainosus, not intractable  G43.709       Vara reports having significant neck, upper and lower back pain over the past month which is contributing to more headaches. She reports migraines are rare. She is tolerating imipramine with no obvious adverse effects. She will continue '50mg'$  at bedtime. I have encouraged her to call Dr Jenkins/Dr Davy Pique to discuss concerns of back/neck pain. If headache is not improving with pain management, we will adjust our treatment plan. Healthy lifestyle habits advised.  I have encouraged her to continue close follow up with PCP, GI, pain management and endocrinology. Healthy lifestyle habits discussed. She will follow up with me in 1 year, sooner if needed.    No orders of the defined types were placed in this encounter.     No orders of the defined types were placed in this encounter.     Debbora Presto, FNP-C 04/04/2022, 11:04 AM [facility] [address], [place], [place] [address] [phone]

## 2022-04-04 ENCOUNTER — Ambulatory Visit
Admission: RE | Admit: 2022-04-04 | Discharge: 2022-04-04 | Disposition: A | Discharge status: Home / Self Care | Payer: Medicare Other | Source: Ambulatory Visit | Attending: Family Medicine | Admitting: Family Medicine

## 2022-04-04 ENCOUNTER — Ambulatory Visit: Payer: Medicare Other | Admitting: Family Medicine

## 2022-04-04 ENCOUNTER — Encounter: Payer: Self-pay | Admitting: Family Medicine

## 2022-04-04 ENCOUNTER — Other Ambulatory Visit: Payer: Self-pay | Admitting: Family Medicine

## 2022-04-04 VITALS — BP 112/66 | HR 97 | Ht 63.0 in | Wt 213.0 lb

## 2022-04-04 DIAGNOSIS — G43709 Chronic migraine without aura, not intractable, without status migrainosus: Secondary | ICD-10-CM

## 2022-04-04 DIAGNOSIS — M25552 Pain in left hip: Secondary | ICD-10-CM

## 2022-04-15 ENCOUNTER — Ambulatory Visit: Payer: Medicare Other

## 2022-04-27 ENCOUNTER — Ambulatory Visit
Admission: RE | Admit: 2022-04-27 | Discharge: 2022-04-27 | Disposition: A | Discharge status: Home / Self Care | Payer: Medicare Other | Source: Ambulatory Visit | Attending: Family Medicine | Admitting: Family Medicine

## 2022-04-27 DIAGNOSIS — Z1231 Encounter for screening mammogram for malignant neoplasm of breast: Secondary | ICD-10-CM

## 2022-05-06 ENCOUNTER — Ambulatory Visit: Payer: Self-pay | Admitting: Surgery

## 2022-05-06 NOTE — Pre-Procedure Instructions (Signed)
Surgical Instructions    Your procedure is scheduled on Wednesday, October 11th.  [facility] at 06:30 A.M., then check in with the Admitting office.  Call this number if you have problems the morning of surgery:  [phone]   If you have any questions prior to your surgery date call [phone]: Open Monday-Friday 8am-4pm    Remember:  Do not eat after midnight the night before your surgery  You may drink clear liquids until 05:30 AM the morning of your surgery.   Clear liquids allowed are: Water, Non-Citrus Juices (without pulp), Carbonated Beverages, Clear Tea, Black Coffee Only (NO MILK, CREAM OR POWDERED CREAMER of any kind), and Gatorade.    Take these medicines the morning of surgery with A SIP OF WATER  amLODipine (NORVASC)  escitalopram (LEXAPRO) levothyroxine (SYNTHROID)  pantoprazole (PROTONIX)  rosuvastatin (CRESTOR)    If needed: acetaminophen (TYLENOL)  fluticasone (FLONASE)  HYDROcodone-acetaminophen (NORCO/VICODIN) LORazepam (ATIVAN)  meclizine (ANTIVERT)  ondansetron (ZOFRAN ODT) oxyCODONE-acetaminophen (PERCOCET/ROXICET)  promethazine (PHENERGAN)  tiZANidine (ZANAFLEX)   Follow your surgeon's instructions on when to stop Aspirin.  If no instructions were given by your surgeon then you will need to call the office to get those instructions.     As of today, STOP taking any Aleve, Naproxen, Ibuprofen, Motrin, Advil, Goody's, BC's, all herbal medications, fish oil, and all vitamins.   WHAT DO I DO ABOUT MY DIABETES MEDICATION?      THE MORNING OF SURGERY, take 22 units (50%) of insulin glargine (LANTUS).  If your CBG is greater than 220 mg/dL, you may take  of your sliding scale HUMALOG dose of insulin (7.5-10 units).   HOW TO MANAGE YOUR DIABETES BEFORE AND AFTER SURGERY  Why is it important to control my blood sugar before and after surgery? Improving blood sugar levels before and after surgery helps healing and can  limit problems. A way of improving blood sugar control is eating a healthy diet by:  Eating less sugar and carbohydrates  Increasing activity/exercise  Talking with your doctor about reaching your blood sugar goals High blood sugars (greater than 180 mg/dL) can raise your risk of infections and slow your recovery, so you will need to focus on controlling your diabetes during the weeks before surgery. Make sure that the doctor who takes care of your diabetes knows about your planned surgery including the date and location.  How do I manage my blood sugar before surgery? Check your blood sugar at least 4 times a day, starting 2 days before surgery, to make sure that the level is not too high or low.  Check your blood sugar the morning of your surgery when you wake up and every 2 hours until you get to the Short Stay unit.  If your blood sugar is less than 70 mg/dL, you will need to treat for low blood sugar: Do not take insulin. Treat a low blood sugar (less than 70 mg/dL) with  cup of clear juice (cranberry or apple), 4 glucose tablets, OR glucose gel. Recheck blood sugar in 15 minutes after treatment (to make sure it is greater than 70 mg/dL). If your blood sugar is not greater than 70 mg/dL on recheck, call [phone] for further instructions. Report your blood sugar to the short stay nurse when you get to Short Stay.  If you are admitted to the hospital after surgery: Your blood sugar will be checked by the staff and you will probably be given insulin after surgery (instead  of oral diabetes medicines) to make sure you have good blood sugar levels. The goal for blood sugar control after surgery is 80-180 mg/dL.                     Do NOT Smoke (Tobacco/Vaping) for 24 hours prior to your procedure.  If you use a CPAP at night, you may bring your mask/headgear for your overnight stay.   Contacts, glasses, piercing's, hearing aid's, dentures or partials may not be worn into surgery,  please bring cases for these belongings.    For patients admitted to the hospital, discharge time will be determined by your treatment team.   Patients discharged the day of surgery will not be allowed to drive home, and someone needs to stay with them for 24 hours.  SURGICAL WAITING ROOM VISITATION Patients having surgery or a procedure may have no more than 2 support people in the waiting area - these visitors may rotate.   Children under the age of 38 must have an adult with them who is not the patient. If the patient needs to stay at the hospital during part of their recovery, the visitor guidelines for inpatient rooms apply. Pre-op nurse will coordinate an appropriate time for 1 support person to accompany patient in pre-op.  This support person may not rotate.   Please refer to the [facility] website for the visitor guidelines for Inpatients (after your surgery is over and you are in a regular room).    Special instructions:   [facility]- Preparing For Surgery  Before surgery, you can play an important role. Because skin is not sterile, your skin needs to be as free of germs as possible. You can reduce the number of germs on your skin by washing with CHG (chlorahexidine gluconate) Soap before surgery.  CHG is an antiseptic cleaner which kills germs and bonds with the skin to continue killing germs even after washing.    Oral Hygiene is also important to reduce your risk of infection.  Remember - BRUSH YOUR TEETH THE MORNING OF SURGERY WITH YOUR REGULAR TOOTHPASTE  Please do not use if you have an allergy to CHG or antibacterial soaps. If your skin becomes reddened/irritated stop using the CHG.  Do not shave (including legs and underarms) for at least 48 hours prior to first CHG shower. It is OK to shave your face.  Please follow these instructions carefully.   Shower the NIGHT BEFORE SURGERY and the MORNING OF SURGERY  If you chose to wash your hair, wash your hair first as usual  with your normal shampoo.  After you shampoo, rinse your hair and body thoroughly to remove the shampoo.  Use CHG Soap as you would any other liquid soap. You can apply CHG directly to the skin and wash gently with a scrungie or a clean washcloth.   Apply the CHG Soap to your body ONLY FROM THE NECK DOWN.  Do not use on open wounds or open sores. Avoid contact with your eyes, ears, mouth and genitals (private parts). Wash Face and genitals (private parts)  with your normal soap.   Wash thoroughly, paying special attention to the area where your surgery will be performed.  Thoroughly rinse your body with warm water from the neck down.  DO NOT shower/wash with your normal soap after using and rinsing off the CHG Soap.  Pat yourself dry with a CLEAN TOWEL.  Wear CLEAN PAJAMAS to bed the night before surgery  Place CLEAN  SHEETS on your bed the night before your surgery  DO NOT SLEEP WITH PETS.   Day of Surgery: Take a shower with CHG soap. Do not wear jewelry or makeup Do not wear lotions, powders, perfumes, or deodorant. Do not shave 48 hours prior to surgery.   Do not bring valuables to the hospital. [facility] is not responsible for any belongings or valuables. Do not wear nail polish, gel polish, artificial nails, or any other type of covering on natural nails (fingers and toes) If you have artificial nails or gel coating that need to be removed by a nail salon, please have this removed prior to surgery. Artificial nails or gel coating may interfere with anesthesia's ability to adequately monitor your vital signs. Wear Clean/Comfortable clothing the morning of surgery Remember to brush your teeth WITH YOUR REGULAR TOOTHPASTE.   Please read over the following fact sheets that you were given.    If you received a COVID test during your pre-op visit  it is requested that you wear a mask when out in public, stay away from anyone that may not be feeling well and notify your surgeon  if you develop symptoms. If you have been in contact with anyone that has tested positive in the last 10 days please notify you surgeon.

## 2022-05-09 ENCOUNTER — Inpatient Hospital Stay (HOSPITAL_COMMUNITY)
Admission: RE | Admit: 2022-05-09 | Discharge: 2022-05-09 | Disposition: A | Discharge status: Home / Self Care | Payer: Medicare Other | Source: Ambulatory Visit

## 2022-05-13 ENCOUNTER — Ambulatory Visit: Payer: Self-pay | Admitting: Surgery

## 2022-05-17 ENCOUNTER — Other Ambulatory Visit: Payer: Self-pay

## 2022-05-17 ENCOUNTER — Encounter (HOSPITAL_COMMUNITY): Payer: Self-pay | Admitting: Surgery

## 2022-05-17 NOTE — Progress Notes (Signed)
Spoke with pt for pre-op call. Pt had a PAT appt in June for this surgery but it was cancelled. She still has the instructions. Pt is a type 1 diabetic, she states her doctor has recently changed it to a type 1. Pt has clearance from Dr. Chalmers Cater dated 04/15/22 and cardiac clearance dated 01/18/22. Dr. Radford Pax is her cardiologist. I have sent pt's chart to [facility] for her to review pt's most recent EKG done on 03/25/22. Ebony Hail had written a note in June on this pt.   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 Instructed pt to take 80% of her Lantus dose in the AM, she will take 35 units. Instructed her to check her blood sugar when she wakes up in the AM. If blood sugar is >220 take 1/2 of usual correction dose of Novolog insulin. If blood sugar is 70 or below, treat with 1/2 cup of clear juice (apple or cranberry) and recheck blood sugar 15 minutes after drinking juice. If blood sugar continues to be 70 or below notify your nurse on arrival. Pt voiced understanding .  Pt has the shower instructions and the CHG soap to use tonight and in the AM.

## 2022-05-17 NOTE — Progress Notes (Signed)
Anesthesia Chart Review: [name]  Case: [id number] Date/Time: 05/18/22 0814   Procedure: REPAIR INCISIONAL HERNIA WITH MESH - TAP BLOCK   Anesthesia type: General   Pre-op diagnosis: INCISIONAL HERNIA, REDUCIBLE   Location: [facility] OR ROOM 07 / [place] [facility]   Surgeons: Erroll Luna, MD       DISCUSSION: Patient is a 71 year old female scheduled for the above procedure. Surgery was initially scheduled for 03/10/22, but delayed after A1c resulted as 9.8% on 03/07/22.    History includes never smoker, post-operative N/V, HTN, CAD (normal coronaries 2014 LHC; no hemodynamically significant flow limiting coronary lesions by 01/13/20 FFR CCTA), hypothyroidism, HLD, DVT (x2, last acute DVT left soleal vein 3/66/44), diastolic dysfunction, aortic insufficiency (mild AI 11/2017), DM2 (DKA, 11/2017, managed as DM1 by endocrinology 09/10/21), CVA (11/2017), anemia, GERD, hiatal hernia, back surgeries (L4-5/L5-S1 PLIF 09/01/03; L3-4 PLIF 11/15/11; L2-3 PLIF 11/15/17), neck surgeries (C3-6 ACDF 03/23/17; C6-7 ACDF 04/18/19), bowel surgery (ascending colectomy for villous adenoma, cecum with high grade dysplasia 04/08/16; exploratory lap with small bowel resection, LOA 09/24/20). BMI is consistent with obesity.   - Preoperative cardiology input outline on 01/18/22 by Christen Bame, NP. "Preoperative Cardiovascular Risk Assessment: She is doing well from a cardiac perspective and may proceed to surgery without further testing. According to the Revised Cardiac Risk Index (RCRI), her Perioperative Risk of Major Cardiac Event is (%): 6.6. Her Functional Capacity in METs is: 7.59 according to the Duke Activity Status Index (DASI).   Request to hold aspirin will need to be directed to patient's neurologist as she takes aspirin for history of CVA."   - ED visit for headache and nausea on 03/23/22. CTA of the head/neck showed chronic ischemic microangiopathy, no emergent large vessel occlusion or hemodynamically significant stenosis of  the head or neck.  Headache though due to migraine and given migraine cocktail. Was given a lower dose of Toradol given mild AKI with Creatinine 1.6 (previously ~ 1.1 02/2022). EKG appeared to show new right BBB, QRS ~ 160 ms. No chest pain. Troponin negative. She was discharged with recommendation for neurology follow-up and was seen by Debbora Presto, NP on 04/04/22 with one year follow-up recommended. Of note, per 01/19/22 letter, Amy had previously indicated, "Ms. Aliyah Abeyta is a established patient under our care at [facility] for treatment of migraines. We have received a medical clearance form to be completed for upcoming procedure for Ms. [place]. We do not manage Ms. Sherrill's Asprin nor have we evaluated for previous history of CVA.  However, from a neurological standpoint, the patient can hold the aspirin prior to the procedure. She may resume post procedure. It appears that the previous CVA was in 2019."    - As above, surgery initially postponed due to A1c of 9.8%. Since then she has had medical follow-up. Per 04/13/22 notation in [facility], "A1c is improving toward goal reduced from 9.8% to 8.5% with fasting and post prandial readings near goal. From endocrinology standpoint will provide clearance for surgery. Will continue with current efforts to have blood sugar at goal." However, Dr. Brantley Stage also asked her to get another A1c before surgery. Per [facility] records in [facility], A1c down to 8.3% on 05/12/22, and Dr. Brantley Stage willing to proceed with surgery with A1c at this level.  ASA is on hold for surgery.    Previously cardiology and neurology preoperative input. Case delayed for about two months to get DM better controlled. A1c reportedly 8.3  as of 05/12/22 and down for 9.8% from 03/07/22. I reviewed above including last three EKGs with anesthesiologist Dr. Lyn Hollingshead. Anesthesia team to evaluate on the day of surgery. She will get CBG as  well as updated labs on arrival for surgery.    VS:  BP Readings from Last 3 Encounters:  04/04/22 112/66  03/23/22 (!) 141/69  03/07/22 114/70   Pulse Readings from Last 3 Encounters:  04/04/22 97  03/23/22 86  03/07/22 89     PROVIDERS: - Harlan Stains, MD is PCP  - Fransico Him, MD is cardiologist. . Initially seen in 2014 for chest pain and had an abnormal stress test following by LHC showing normal coronaries. Last office visit by Dr. Radford Pax on 05/25/21 with virtual visit with Christen Bame, NP non 01/18/22.  [name], MD is endocrinologist - Lomax, Amy, NP is neurology provider    LABS: For day of surgery as indicated. As above, notes indicated last A1c 8.3% on 05/12/22. Most recent lab results in [facility] include: Lab Results  Component Value Date   WBC 8.2 03/23/2022   HGB 13.8 03/23/2022   HCT 42.2 03/23/2022   PLT 262 03/23/2022   GLUCOSE 147 (H) 03/23/2022   ALT 12 03/23/2022   AST 17 03/23/2022   NA 133 (L) 03/23/2022   K 4.5 03/23/2022   CL 97 (L) 03/23/2022   CREATININE 1.64 (H) 03/23/2022   BUN 33 (H) 03/23/2022   CO2 25 03/23/2022   Lab Results  Component Value Date   CREATININE 1.64 (H) 03/23/2022   CREATININE 1.10 (H) 03/07/2022   CREATININE 1.11 (H) 01/28/2022      IMAGES: CXR 03/23/22: FINDINGS: - Anterior cervical fusion. Normal mediastinum and cardiac silhouette. Normal pulmonary vasculature. No evidence of effusion, infiltrate, or pneumothorax. No acute bony abnormality. - Posterior lumbar fusion. IMPRESSION: No acute cardiopulmonary process.   CTA Head/Neck 03/23/22: IMPRESSION: 1. No emergent large vessel occlusion or hemodynamically significant stenosis of the head or neck. 2. Chronic ischemic microangiopathy. 3. Aortic Atherosclerosis (ICD10-I70.0).   CT Abd/pelvis 01/28/22: IMPRESSION: - Prior ileocolic resection with herniation of transverse colon into a supraumbilical ventral hernia without bowel obstruction. -  Tiny umbilical and supraumbilical hernias containing fat. - No acute intra-abdominal or intrapelvic abnormalities. - Aortic Atherosclerosis (ICD10-I70.0).     EKG:  EKG 03/23/22: Interpreted in ED as: sinus rhythm Indeterminate axis Pulmonary disease pattern Marked ST abnormality, possible inferolateral subendocardial injury Abnormal ECG When compared with ECG of 05-Jul-2021 11:49, PREVIOUS ECG IS PRESENT Confirmed by Wandra Arthurs [phone]) on 03/23/2022 5:23:10 PM - EKG was done during ED visit fir headache with nausea, no chest pain. Normal troponin. Changes appear to be related to new right BBB. I calculated QRS duration of 160 ms. Reviewed EKG with anesthesiologist Dr. Jillyn Hidden as well.   EKG 07/05/21: Sinus rhythm Borderline prolonged PR interval Left axis deviation Confirmed by Ronnald Nian, Adam ([id number]) on 07/05/2021 12:17:16 PM       CV: CT Coronary 01/13/20: - Aorta:  Normal size.  Scattered calcifications.  No dissection. - Aortic Valve:  Trileaflet.  No calcifications. - Mitral Valve: Marked posterior mitral annular calcifications. - Coronary Arteries:  Normal coronary origin.  Right dominance.   RCA is a large dominant artery that gives rise to PDA and PLVB. There is mild calcified plaque in the mid RCA with associated stenosis of 25-49% and sequential mid RCA lesion with moderate mixed plaque with positive remodeling with associated stenosis of 50-69%.   Left main  is a large artery that gives rise to LAD and LCX arteries. There is no plaque.   LAD is a large vessel that gives rise to a large branching D1. There is minimal calcified plaque in the proximal LAD with associated stenosis of 0-24%. There is mild calcified plaque in the inferior sub branch of the Diagonal with associated stenosis of 25-49%.   LCX is a non-dominant artery that gives rise to one large OM1 branch. There is mild mixed plaque with positive remodeling in the proximal LCx with associated stenosis of  25-49%.   - Other findings: Normal pulmonary vein drainage into the left atrium. Normal let atrial appendage without a thrombus. Normal size of the pulmonary artery.   IMPRESSION: 1. Coronary calcium score of 506. This was 94th percentile for age and sex matched control. 2.  Normal coronary origin with right dominance. 3.  Moderate atherosclerosis.  CAD-RADS=3. 4. Consider symptom-guided anti-ischemic and preventive pharmacotherapy as well as risk factor modification per guideline-directed care. 5.  Consider functional ischemia assessment. 6.  This study has been submitted for FFR analysis.   FFR 01/13/20: 1. Left Main: No significant stenosis. LM FFR = 0.99. 2. LAD: No significant stenosis. Proximal FFR = 0.99, Mid FFR = 0.95, Distal FFR = 0.85. 3. LCX: No significant stenosis. Proximal FFR = 1.00, Mid FFR = 0.99, Distal FFR = 0.91. 4. RCA: No significant stenosis. Proximal FFR = 0.98, Mid FFR = 0.97, Distal FFR = 0.88.   IMPRESSION: 1. CT FFR flow analysis demonstrates no hemodynamically significant flow limiting lesions.     TEE 11/23/17: Study Conclusions - Left ventricle: The cavity size was normal. Wall thickness was   normal. Systolic function was normal. The estimated ejection   fraction was in the range of 55% to 60%. - Aortic valve: No evidence of vegetation. There was mild   regurgitation. - Mitral valve: No evidence of vegetation. There was mild   regurgitation. - Left atrium: No evidence of thrombus in the appendage. - Right atrium: No evidence of thrombus in the atrial cavity or   appendage. - Atrial septum: No defect or patent foramen ovale was identified.   Echo contrast study showed no right-to-left atrial level shunt,   following an increase in RA pressure induced by provocative   maneuvers. - Tricuspid valve: No evidence of vegetation. - Pulmonic valve: No evidence of vegetation. - Superior vena cava: The study excluded a thrombus. Impressions: - No  cardiac source of emboli was indentified.     Echo (TTE) 09/08/17: Study Conclusions - Left ventricle: The cavity size was normal. Wall thickness was   increased in a pattern of moderate LVH. Systolic function was   vigorous. The estimated ejection fraction was in the range of 65%   to 70%. Wall motion was normal; there were no regional wall   motion abnormalities. Doppler parameters are consistent with   abnormal left ventricular relaxation (grade 1 diastolic   dysfunction). - Mitral valve: Calcified annulus. - Left atrium: The atrium was mildly dilated.     Cardiac event monitor 09/08/17-10/07/17:  Normal sinus rhtyhm with average heart rate 95bpm. Sinus tachycardia up to 120bpm. Frequent PACs     Cardiac cath 05/03/13: IMPRESSIONS: No angiographically significant CAD Normal left ventricular systolic function.  LVEDP 18 mmHg.  Ejection fraction 60%.    Past Medical History:  Diagnosis Date   Anemia    iron deficiency   Arthritis    Blood transfusion    CAD (coronary artery disease),  native coronary artery    coronary CTA showed a coronary Ca score of 506, mild CAD with 25-49% mRCA and moderate CAD with 50-69% sequential mRCA, 25-49% inferior sub branch of D1 and 25-49% in pLCx.     Cervical spondylosis with myelopathy and radiculopathy    CVA (cerebral vascular accident) ([place])    11/2017 - no weakness or paralysis   Depression    Diabetes mellitus    Type 1   Diastolic dysfunction    Diverticulitis    DJD (degenerative joint disease)    DVT (deep venous thrombosis) (HCC)    times 2 lower leg   Endometriosis    Family history of adverse reaction to anesthesia    mom and dad PONV   Fibromyalgia    GERD (gastroesophageal reflux disease)    occ   Hiatal hernia    History of kidney stones    Hyperlipidemia    Hypertension    Hypothyroidism    Insomnia    Mild aortic insufficiency    by echo 04/2013   Osteoarthritis    back and knee   Ovarian cyst    Pneumonia     PONV (postoperative nausea and vomiting)    RLS (restless legs syndrome)    Thoracic compression fracture (HCC)    Uterine fibroid    UTI (lower urinary tract infection)    Villous adenoma of right colon 04/08/2016   Vitamin D deficiency disease     Past Surgical History:  Procedure Laterality Date   ANTERIOR CERVICAL DECOMP/DISCECTOMY FUSION N/A 03/23/2017   Procedure: ANTERIOR CERVICAL DECOMPRESSION/DISCECTOMY FUSION, INTERBODY PROSTHESIS,PLATE CERVICAL THREE- CERVICAL FOUR, CERVICAL FOUR- CERVICAL FIVE, CERVICAL FIVE- CERVICAL SIX;  Surgeon: Newman Pies, MD;  Location: [place];  Service: Neurosurgery;  Laterality: N/A;   ANTERIOR CERVICAL DECOMP/DISCECTOMY FUSION N/A 04/18/2019   Procedure: ANTERIOR CERVICAL DECOMPRESSION/DISCECTOMY  CERVICAL SIX- CERVICAL SEVEN;  Surgeon: Newman Pies, MD;  Location: [place];  Service: Neurosurgery;  Laterality: N/A;  anterior   APPENDECTOMY     BACK SURGERY  2005   April  2013 - spinal fusion@ [facility]   bowel blockage surgery     BREAST SURGERY     breast reduction   CARDIAC CATHETERIZATION  04/2013   normal coronary arteries and normal LVF   CARPAL TUNNEL RELEASE  06/05/2012   Procedure: CARPAL TUNNEL RELEASE;  Surgeon: Cammie Sickle., MD;  Location: [place];  Service: Orthopedics;  Laterality: Left;   CESAREAN SECTION     x 2   CHOLECYSTECTOMY     COLECTOMY  04/08/2016   DILATION AND CURETTAGE OF UTERUS     DORSAL COMPARTMENT RELEASE  06/05/2012   Procedure: RELEASE DORSAL COMPARTMENT (DEQUERVAIN);  Surgeon: Cammie Sickle., MD;  Location: [facility];  Service: Orthopedics;  Laterality: Left;  Excision of mixoid cyst also   EYE SURGERY     cataracts bilateral   JOINT REPLACEMENT     Bilateral knee   KNEE ARTHROPLASTY  2009   lft partial   KNEE ARTHROPLASTY     rt   LAPAROSCOPIC PARTIAL COLECTOMY N/A 04/08/2016   Procedure: LAPAROSCOPIC ASSISTED ASCENDING COLECTOMY POSSIBLE OPEN COLECTOMY;   Surgeon: Fanny Skates, MD;  Location: [place];  Service: General;  Laterality: N/A;   orthopedic surgeries     multiple   perforated bowl     Sep 09 2020   REDUCTION MAMMAPLASTY Bilateral    SHOULDER OPEN ROTATOR CUFF REPAIR     rt  and lft   TEE WITHOUT CARDIOVERSION N/A 11/23/2017   Procedure: TRANSESOPHAGEAL ECHOCARDIOGRAM (TEE);  Surgeon: Jerline Pain, MD;  Location[facility];  Service: Cardiovascular;  Laterality: N/A;   TUBAL LIGATION     btsp    MEDICATIONS: No current facility-administered medications for this encounter.    acetaminophen (TYLENOL) 500 MG tablet   amLODipine (NORVASC) 2.5 MG tablet   cetirizine (ZYRTEC) 10 MG tablet   Cholecalciferol (VITAMIN D3) 125 MCG (5000 UT) TABS   Clobetasol Propionate (TEMOVATE) 0.05 % external spray   escitalopram (LEXAPRO) 5 MG tablet   fluticasone (FLONASE) 50 MCG/ACT nasal spray   furosemide (LASIX) 20 MG tablet   gabapentin (NEURONTIN) 300 MG capsule   Glucagon (GVOKE HYPOPEN 1-PACK) 1 MG/0.2ML SOAJ   HUMALOG 100 UNIT/ML injection   ibandronate (BONIVA) 150 MG tablet   imipramine (TOFRANIL) 50 MG tablet   insulin glargine (LANTUS) 100 UNIT/ML injection   irbesartan-hydrochlorothiazide (AVALIDE) 300-12.5 MG tablet   levothyroxine (SYNTHROID) 175 MCG tablet   LORazepam (ATIVAN) 0.5 MG tablet   Magnesium 250 MG TABS   meclizine (ANTIVERT) 12.5 MG tablet   ondansetron (ZOFRAN ODT) 4 MG disintegrating tablet   oxyCODONE-acetaminophen (PERCOCET/ROXICET) 5-325 MG tablet   pantoprazole (PROTONIX) 40 MG tablet   Potassium 99 MG TABS   Probiotic Product (PROBIOTIC PO)   promethazine (PHENERGAN) 25 MG tablet   rosuvastatin (CRESTOR) 20 MG tablet   temazepam (RESTORIL) 15 MG capsule   tiZANidine (ZANAFLEX) 4 MG tablet   vitamin B-12 (CYANOCOBALAMIN) 1000 MCG tablet   zinc sulfate 220 (50 Zn) MG capsule   aspirin EC 81 MG tablet   cephALEXin (KEFLEX) 500 MG capsule   cyclobenzaprine (FLEXERIL) 10 MG tablet    HYDROcodone-acetaminophen (NORCO/VICODIN) 5-325 MG tablet   metoprolol succinate (TOPROL-XL) 25 MG 24 hr tablet    Myra Gianotti, PA-C Surgical Short Stay/Anesthesiology [facility] Phone [phone] [facility] Phone [phone] 05/17/2022 2:12 PM

## 2022-05-17 NOTE — Anesthesia Preprocedure Evaluation (Addendum)
Anesthesia Evaluation  Patient identified by MRN, date of birth, ID band Patient awake    Reviewed: Allergy & Precautions, NPO status , Patient's Chart, lab work & pertinent test results, reviewed documented beta blocker date and time   History of Anesthesia Complications (+) PONV and history of anesthetic complications  Airway Mallampati: III  TM Distance: >3 FB Neck ROM: Full    Dental  (+) Dental Advisory Given   Pulmonary neg pulmonary ROS,    Pulmonary exam normal        Cardiovascular hypertension, Pt. on medications and Pt. on home beta blockers + CAD and + DVT  Normal cardiovascular exam     Neuro/Psych PSYCHIATRIC DISORDERS Anxiety Depression CVA, No Residual Symptoms    GI/Hepatic Neg liver ROS, hiatal hernia, GERD  Medicated and Controlled,  Endo/Other  diabetes, Type 2, Insulin DependentHypothyroidism   Renal/GU negative Renal ROS     Musculoskeletal  (+) Arthritis , Osteoarthritis,  Fibromyalgia -  Abdominal   Peds  Hematology negative hematology ROS (+)   Anesthesia Other Findings RLS   Reproductive/Obstetrics                           Anesthesia Physical Anesthesia Plan  ASA: 3  Anesthesia Plan: General   Post-op Pain Management: Tylenol PO (pre-op)*   Induction: Intravenous  PONV Risk Score and Plan: 4 or greater and Treatment may vary due to age or medical condition, Ondansetron and TIVA  Airway Management Planned: Oral ETT and Video Laryngoscope Planned  Additional Equipment: None  Intra-op Plan:   Post-operative Plan: Extubation in OR  Informed Consent: I have reviewed the patients History and Physical, chart, labs and discussed the procedure including the risks, benefits and alternatives for the proposed anesthesia with the patient or authorized representative who has indicated his/her understanding and acceptance.     Dental advisory given  Plan  Discussed with: CRNA and Anesthesiologist  Anesthesia Plan Comments:       Anesthesia Quick Evaluation

## 2022-05-18 ENCOUNTER — Ambulatory Visit (HOSPITAL_COMMUNITY): Payer: Medicare Other | Admitting: Physician Assistant

## 2022-05-18 ENCOUNTER — Other Ambulatory Visit: Payer: Self-pay

## 2022-05-18 ENCOUNTER — Encounter (HOSPITAL_COMMUNITY)
Admission: RE | Disposition: A | Discharge status: Home / Self Care | Payer: Self-pay | Source: Home / Self Care | Attending: Surgery

## 2022-05-18 ENCOUNTER — Observation Stay (HOSPITAL_COMMUNITY)
Admission: RE | Admit: 2022-05-18 | Discharge: 2022-05-20 | Disposition: A | Discharge status: Home / Self Care | Payer: Medicare Other | Attending: Surgery | Admitting: Surgery

## 2022-05-18 DIAGNOSIS — Z86718 Personal history of other venous thrombosis and embolism: Secondary | ICD-10-CM | POA: Insufficient documentation

## 2022-05-18 DIAGNOSIS — I10 Essential (primary) hypertension: Secondary | ICD-10-CM

## 2022-05-18 DIAGNOSIS — Z79899 Other long term (current) drug therapy: Secondary | ICD-10-CM | POA: Diagnosis not present

## 2022-05-18 DIAGNOSIS — K432 Incisional hernia without obstruction or gangrene: Secondary | ICD-10-CM | POA: Diagnosis present

## 2022-05-18 DIAGNOSIS — I2510 Atherosclerotic heart disease of native coronary artery without angina pectoris: Secondary | ICD-10-CM

## 2022-05-18 DIAGNOSIS — Z8673 Personal history of transient ischemic attack (TIA), and cerebral infarction without residual deficits: Secondary | ICD-10-CM | POA: Diagnosis not present

## 2022-05-18 DIAGNOSIS — Z7984 Long term (current) use of oral hypoglycemic drugs: Secondary | ICD-10-CM | POA: Diagnosis not present

## 2022-05-18 DIAGNOSIS — E039 Hypothyroidism, unspecified: Secondary | ICD-10-CM

## 2022-05-18 DIAGNOSIS — E119 Type 2 diabetes mellitus without complications: Secondary | ICD-10-CM | POA: Diagnosis not present

## 2022-05-18 DIAGNOSIS — Z794 Long term (current) use of insulin: Secondary | ICD-10-CM | POA: Diagnosis not present

## 2022-05-18 DIAGNOSIS — Z7982 Long term (current) use of aspirin: Secondary | ICD-10-CM | POA: Diagnosis not present

## 2022-05-18 HISTORY — PX: INCISIONAL HERNIA REPAIR: SHX193

## 2022-05-18 HISTORY — DX: Pneumonia, unspecified organism: J18.9

## 2022-05-18 LAB — CBC
HCT: 36.4 % (ref 36.0–46.0)
Hemoglobin: 12.3 g/dL (ref 12.0–15.0)
MCH: 28.1 pg (ref 26.0–34.0)
MCHC: 33.8 g/dL (ref 30.0–36.0)
MCV: 83.3 fL (ref 80.0–100.0)
Platelets: 224 10*3/uL (ref 150–400)
RBC: 4.37 MIL/uL (ref 3.87–5.11)
RDW: 12.8 % (ref 11.5–15.5)
WBC: 7.3 10*3/uL (ref 4.0–10.5)
nRBC: 0 % (ref 0.0–0.2)

## 2022-05-18 LAB — BASIC METABOLIC PANEL
Anion gap: 11 (ref 5–15)
BUN: 23 mg/dL (ref 8–23)
CO2: 25 mmol/L (ref 22–32)
Calcium: 9.7 mg/dL (ref 8.9–10.3)
Chloride: 103 mmol/L (ref 98–111)
Creatinine, Ser: 1.31 mg/dL — ABNORMAL HIGH (ref 0.44–1.00)
GFR, Estimated: 44 mL/min — ABNORMAL LOW (ref >60)
Glucose, Bld: 177 mg/dL — ABNORMAL HIGH (ref 70–99)
Potassium: 3.6 mmol/L (ref 3.5–5.1)
Sodium: 139 mmol/L (ref 135–145)

## 2022-05-18 LAB — GLUCOSE, CAPILLARY
Glucose-Capillary: 191 mg/dL — ABNORMAL HIGH (ref 70–99)
Glucose-Capillary: 127 mg/dL — ABNORMAL HIGH (ref 70–99)
Glucose-Capillary: 156 mg/dL — ABNORMAL HIGH (ref 70–99)
Glucose-Capillary: 187 mg/dL — ABNORMAL HIGH (ref 70–99)

## 2022-05-18 SURGERY — REPAIR, HERNIA, INCISIONAL
Anesthesia: General | Site: Abdomen

## 2022-05-18 SURGERY — REPAIR, HERNIA, INCISIONAL
Anesthesia: General

## 2022-05-18 MED ORDER — FENTANYL CITRATE PF 50 MCG/ML IJ SOSY: 50.0000 ug | PREFILLED_SYRINGE | INTRAMUSCULAR | Status: DC | PRN

## 2022-05-18 MED ORDER — IRBESARTAN-HYDROCHLOROTHIAZIDE 300-12.5 MG PO TABS: 1.0000 | ORAL_TABLET | Freq: Every morning | ORAL | Status: DC

## 2022-05-18 MED ORDER — ONDANSETRON 4 MG PO TBDP: 4.0000 mg | ORAL_TABLET | Freq: Three times a day (TID) | ORAL | Status: DC | PRN

## 2022-05-18 MED ORDER — OXYCODONE-ACETAMINOPHEN 5-325 MG PO TABS: 1.0000 | ORAL_TABLET | Freq: Four times a day (QID) | ORAL | Status: DC | PRN

## 2022-05-18 MED ORDER — AMLODIPINE BESYLATE 2.5 MG PO TABS
2.5000 mg | ORAL_TABLET | Freq: Every day | ORAL | Status: DC
  Administered 2022-05-19 – 2022-05-20 (×2): 2.5 mg via ORAL
  Filled 2022-05-18 (×2): qty 1

## 2022-05-18 MED ORDER — FENTANYL CITRATE (PF) 100 MCG/2ML IJ SOLN: 25.0000 ug | INTRAMUSCULAR | Status: DC | PRN

## 2022-05-18 MED ORDER — ONDANSETRON HCL 4 MG/2ML IJ SOLN
INTRAMUSCULAR | Status: AC
  Filled 2022-05-18: qty 2

## 2022-05-18 MED ORDER — PHENYLEPHRINE 80 MCG/ML (10ML) SYRINGE FOR IV PUSH (FOR BLOOD PRESSURE SUPPORT)
PREFILLED_SYRINGE | INTRAVENOUS | Status: AC
  Filled 2022-05-18: qty 10

## 2022-05-18 MED ORDER — ACETAMINOPHEN 500 MG PO TABS
1000.0000 mg | ORAL_TABLET | Freq: Once | ORAL | Status: AC
  Administered 2022-05-18: 1000 mg via ORAL
  Filled 2022-05-18: qty 2

## 2022-05-18 MED ORDER — MIDAZOLAM HCL 2 MG/2ML IJ SOLN
INTRAMUSCULAR | Status: AC
  Filled 2022-05-18: qty 2

## 2022-05-18 MED ORDER — IMIPRAMINE HCL 25 MG PO TABS
50.0000 mg | ORAL_TABLET | Freq: Every day | ORAL | Status: DC
  Administered 2022-05-18 – 2022-05-19 (×2): 50 mg via ORAL
  Filled 2022-05-18 (×2): qty 2

## 2022-05-18 MED ORDER — LIDOCAINE 2% (20 MG/ML) 5 ML SYRINGE
INTRAMUSCULAR | Status: AC
  Filled 2022-05-18: qty 5

## 2022-05-18 MED ORDER — GABAPENTIN 300 MG PO CAPS: 300.0000 mg | ORAL_CAPSULE | Freq: Three times a day (TID) | ORAL | Status: DC | PRN

## 2022-05-18 MED ORDER — ONDANSETRON HCL 4 MG/2ML IJ SOLN: 4.0000 mg | Freq: Four times a day (QID) | INTRAMUSCULAR | Status: DC | PRN

## 2022-05-18 MED ORDER — PHENYLEPHRINE 80 MCG/ML (10ML) SYRINGE FOR IV PUSH (FOR BLOOD PRESSURE SUPPORT)
PREFILLED_SYRINGE | INTRAVENOUS | Status: DC | PRN
  Administered 2022-05-18: 200 ug via INTRAVENOUS

## 2022-05-18 MED ORDER — FUROSEMIDE 20 MG PO TABS: 20.0000 mg | ORAL_TABLET | Freq: Every day | ORAL | Status: DC | PRN

## 2022-05-18 MED ORDER — KETOROLAC TROMETHAMINE 15 MG/ML IJ SOLN
15.0000 mg | Freq: Four times a day (QID) | INTRAMUSCULAR | Status: AC
  Administered 2022-05-18: 15 mg via INTRAVENOUS

## 2022-05-18 MED ORDER — IRBESARTAN 300 MG PO TABS
300.0000 mg | ORAL_TABLET | Freq: Every day | ORAL | Status: DC
  Administered 2022-05-18 – 2022-05-20 (×3): 300 mg via ORAL
  Filled 2022-05-18 (×3): qty 1

## 2022-05-18 MED ORDER — LORAZEPAM 0.5 MG PO TABS: 0.5000 mg | ORAL_TABLET | Freq: Three times a day (TID) | ORAL | Status: DC | PRN

## 2022-05-18 MED ORDER — VANCOMYCIN HCL IN DEXTROSE 1-5 GM/200ML-% IV SOLN
1000.0000 mg | INTRAVENOUS | Status: AC
  Administered 2022-05-18: 1000 mg via INTRAVENOUS
  Filled 2022-05-18: qty 200

## 2022-05-18 MED ORDER — HYDROCHLOROTHIAZIDE 12.5 MG PO TABS
12.5000 mg | ORAL_TABLET | Freq: Every day | ORAL | Status: DC
  Administered 2022-05-18 – 2022-05-20 (×3): 12.5 mg via ORAL
  Filled 2022-05-18 (×3): qty 1

## 2022-05-18 MED ORDER — ESCITALOPRAM OXALATE 10 MG PO TABS
5.0000 mg | ORAL_TABLET | Freq: Every day | ORAL | Status: DC
  Administered 2022-05-19 – 2022-05-20 (×2): 5 mg via ORAL
  Filled 2022-05-18 (×2): qty 1

## 2022-05-18 MED ORDER — 0.9 % SODIUM CHLORIDE (POUR BTL) OPTIME
TOPICAL | Status: DC | PRN
  Administered 2022-05-18: 1000 mL

## 2022-05-18 MED ORDER — SODIUM CHLORIDE (PF) 0.9 % IJ SOLN
INTRAMUSCULAR | Status: AC
  Filled 2022-05-18: qty 20

## 2022-05-18 MED ORDER — OXYCODONE HCL 5 MG PO TABS
5.0000 mg | ORAL_TABLET | ORAL | Status: DC | PRN
  Administered 2022-05-18 (×2): 10 mg via ORAL
  Filled 2022-05-18 (×2): qty 2

## 2022-05-18 MED ORDER — FENTANYL CITRATE (PF) 250 MCG/5ML IJ SOLN
INTRAMUSCULAR | Status: AC
  Filled 2022-05-18: qty 5

## 2022-05-18 MED ORDER — LACTATED RINGERS IV SOLN: INTRAVENOUS | Status: DC

## 2022-05-18 MED ORDER — PROMETHAZINE HCL 25 MG PO TABS: 25.0000 mg | ORAL_TABLET | Freq: Three times a day (TID) | ORAL | Status: DC | PRN

## 2022-05-18 MED ORDER — OXYCODONE HCL 5 MG PO TABS: 5.0000 mg | ORAL_TABLET | Freq: Once | ORAL | Status: DC | PRN

## 2022-05-18 MED ORDER — PANTOPRAZOLE SODIUM 40 MG PO TBEC
40.0000 mg | DELAYED_RELEASE_TABLET | Freq: Every day | ORAL | Status: DC
  Administered 2022-05-19 – 2022-05-20 (×2): 40 mg via ORAL
  Filled 2022-05-18 (×2): qty 1

## 2022-05-18 MED ORDER — LORATADINE 10 MG PO TABS
10.0000 mg | ORAL_TABLET | Freq: Every day | ORAL | Status: DC
  Administered 2022-05-19 – 2022-05-20 (×2): 10 mg via ORAL
  Filled 2022-05-18 (×2): qty 1

## 2022-05-18 MED ORDER — KETOROLAC TROMETHAMINE 30 MG/ML IJ SOLN
INTRAMUSCULAR | Status: AC
  Filled 2022-05-18: qty 1

## 2022-05-18 MED ORDER — ONDANSETRON HCL 4 MG/2ML IJ SOLN: 4.0000 mg | Freq: Once | INTRAMUSCULAR | Status: DC | PRN

## 2022-05-18 MED ORDER — TRAMADOL HCL 50 MG PO TABS: 50.0000 mg | ORAL_TABLET | Freq: Four times a day (QID) | ORAL | Status: DC | PRN

## 2022-05-18 MED ORDER — PHENYLEPHRINE HCL-NACL 20-0.9 MG/250ML-% IV SOLN
INTRAVENOUS | Status: DC | PRN
  Administered 2022-05-18 (×2): 40 ug via INTRAVENOUS
  Administered 2022-05-18 (×3): 80 ug via INTRAVENOUS
  Administered 2022-05-18: 160 ug via INTRAVENOUS
  Administered 2022-05-18: 80 ug via INTRAVENOUS
  Administered 2022-05-18: 30 ug/min via INTRAVENOUS

## 2022-05-18 MED ORDER — CHLORHEXIDINE GLUCONATE CLOTH 2 % EX PADS: 6.0000 | MEDICATED_PAD | Freq: Once | CUTANEOUS | Status: DC

## 2022-05-18 MED ORDER — INSULIN ASPART 100 UNIT/ML IJ SOLN
INTRAMUSCULAR | Status: AC
  Filled 2022-05-18: qty 1

## 2022-05-18 MED ORDER — SUGAMMADEX SODIUM 200 MG/2ML IV SOLN
INTRAVENOUS | Status: DC | PRN
  Administered 2022-05-18: 200 mg via INTRAVENOUS

## 2022-05-18 MED ORDER — ROCURONIUM BROMIDE 10 MG/ML (PF) SYRINGE
PREFILLED_SYRINGE | INTRAVENOUS | Status: AC
  Filled 2022-05-18: qty 10

## 2022-05-18 MED ORDER — ACETAMINOPHEN 650 MG RE SUPP: 650.0000 mg | Freq: Four times a day (QID) | RECTAL | Status: DC | PRN

## 2022-05-18 MED ORDER — BUPIVACAINE LIPOSOME 1.3 % IJ SUSP
INTRAMUSCULAR | Status: AC
  Filled 2022-05-18: qty 20

## 2022-05-18 MED ORDER — DEXAMETHASONE SODIUM PHOSPHATE 10 MG/ML IJ SOLN
INTRAMUSCULAR | Status: DC | PRN
  Administered 2022-05-18: 10 mg via INTRAVENOUS

## 2022-05-18 MED ORDER — ORAL CARE MOUTH RINSE: 15.0000 mL | Freq: Once | OROMUCOSAL | Status: AC

## 2022-05-18 MED ORDER — KETOROLAC TROMETHAMINE 15 MG/ML IJ SOLN
15.0000 mg | Freq: Four times a day (QID) | INTRAMUSCULAR | Status: DC | PRN
  Administered 2022-05-19: 15 mg via INTRAVENOUS
  Filled 2022-05-18: qty 1

## 2022-05-18 MED ORDER — METOPROLOL SUCCINATE ER 25 MG PO TB24
25.0000 mg | ORAL_TABLET | Freq: Every day | ORAL | Status: DC
  Administered 2022-05-19 – 2022-05-20 (×2): 25 mg via ORAL
  Filled 2022-05-18 (×2): qty 1

## 2022-05-18 MED ORDER — INSULIN ASPART 100 UNIT/ML IJ SOLN
0.0000 [IU] | INTRAMUSCULAR | Status: DC | PRN
  Administered 2022-05-18: 2 [IU] via SUBCUTANEOUS

## 2022-05-18 MED ORDER — INSULIN ASPART 100 UNIT/ML IJ SOLN
0.0000 [IU] | INTRAMUSCULAR | Status: DC
  Administered 2022-05-18 – 2022-05-19 (×3): 3 [IU] via SUBCUTANEOUS
  Administered 2022-05-19 (×2): 8 [IU] via SUBCUTANEOUS
  Administered 2022-05-19: 5 [IU] via SUBCUTANEOUS
  Administered 2022-05-19: 8 [IU] via SUBCUTANEOUS
  Administered 2022-05-19: 5 [IU] via SUBCUTANEOUS
  Administered 2022-05-20: 8 [IU] via SUBCUTANEOUS
  Administered 2022-05-20: 3 [IU] via SUBCUTANEOUS

## 2022-05-18 MED ORDER — SIMETHICONE 80 MG PO CHEW: 40.0000 mg | CHEWABLE_TABLET | Freq: Four times a day (QID) | ORAL | Status: DC | PRN

## 2022-05-18 MED ORDER — KETOROLAC TROMETHAMINE 15 MG/ML IJ SOLN
INTRAMUSCULAR | Status: AC
  Filled 2022-05-18: qty 1

## 2022-05-18 MED ORDER — FENTANYL CITRATE (PF) 250 MCG/5ML IJ SOLN
INTRAMUSCULAR | Status: DC | PRN
  Administered 2022-05-18 (×4): 50 ug via INTRAVENOUS

## 2022-05-18 MED ORDER — SODIUM CHLORIDE (PF) 0.9 % IJ SOLN
INTRAMUSCULAR | Status: DC | PRN
  Administered 2022-05-18: 40 mL

## 2022-05-18 MED ORDER — SODIUM CHLORIDE 0.9 % IV SOLN: INTRAVENOUS | Status: DC

## 2022-05-18 MED ORDER — DIPHENHYDRAMINE HCL 50 MG/ML IJ SOLN: 25.0000 mg | Freq: Four times a day (QID) | INTRAMUSCULAR | Status: DC | PRN

## 2022-05-18 MED ORDER — LIDOCAINE 2% (20 MG/ML) 5 ML SYRINGE
INTRAMUSCULAR | Status: DC | PRN
  Administered 2022-05-18: 60 mg via INTRAVENOUS

## 2022-05-18 MED ORDER — ROSUVASTATIN CALCIUM 20 MG PO TABS
20.0000 mg | ORAL_TABLET | Freq: Every day | ORAL | Status: DC
  Administered 2022-05-19 – 2022-05-20 (×2): 20 mg via ORAL
  Filled 2022-05-18 (×2): qty 1

## 2022-05-18 MED ORDER — OXYCODONE HCL 5 MG/5ML PO SOLN: 5.0000 mg | Freq: Once | ORAL | Status: DC | PRN

## 2022-05-18 MED ORDER — PROPOFOL 500 MG/50ML IV EMUL
INTRAVENOUS | Status: DC | PRN
  Administered 2022-05-18: 250 ug/kg/min via INTRAVENOUS

## 2022-05-18 MED ORDER — METHOCARBAMOL 500 MG PO TABS: 500.0000 mg | ORAL_TABLET | Freq: Four times a day (QID) | ORAL | Status: DC | PRN

## 2022-05-18 MED ORDER — SCOPOLAMINE 1 MG/3DAYS TD PT72
MEDICATED_PATCH | TRANSDERMAL | Status: AC
  Filled 2022-05-18: qty 1

## 2022-05-18 MED ORDER — PROPOFOL 10 MG/ML IV BOLUS
INTRAVENOUS | Status: DC | PRN
  Administered 2022-05-18: 140 mg via INTRAVENOUS

## 2022-05-18 MED ORDER — METOPROLOL TARTRATE 5 MG/5ML IV SOLN: 5.0000 mg | Freq: Four times a day (QID) | INTRAVENOUS | Status: DC | PRN

## 2022-05-18 MED ORDER — ONDANSETRON 4 MG PO TBDP: 4.0000 mg | ORAL_TABLET | Freq: Four times a day (QID) | ORAL | Status: DC | PRN

## 2022-05-18 MED ORDER — MIDAZOLAM HCL 2 MG/2ML IJ SOLN
INTRAMUSCULAR | Status: DC | PRN
  Administered 2022-05-18: 1 mg via INTRAVENOUS

## 2022-05-18 MED ORDER — CHLORHEXIDINE GLUCONATE 0.12 % MT SOLN
15.0000 mL | Freq: Once | OROMUCOSAL | Status: AC
  Administered 2022-05-18: 15 mL via OROMUCOSAL
  Filled 2022-05-18: qty 15

## 2022-05-18 MED ORDER — LEVOTHYROXINE SODIUM 75 MCG PO TABS
175.0000 ug | ORAL_TABLET | Freq: Every day | ORAL | Status: DC
  Administered 2022-05-19 – 2022-05-20 (×2): 175 ug via ORAL
  Filled 2022-05-18 (×2): qty 1

## 2022-05-18 MED ORDER — ROCURONIUM BROMIDE 10 MG/ML (PF) SYRINGE
PREFILLED_SYRINGE | INTRAVENOUS | Status: DC | PRN
  Administered 2022-05-18: 10 mg via INTRAVENOUS
  Administered 2022-05-18 (×2): 50 mg via INTRAVENOUS

## 2022-05-18 MED ORDER — ACETAMINOPHEN 325 MG PO TABS
650.0000 mg | ORAL_TABLET | Freq: Four times a day (QID) | ORAL | Status: DC | PRN
  Administered 2022-05-20: 650 mg via ORAL
  Filled 2022-05-18: qty 2

## 2022-05-18 MED ORDER — PROPOFOL 10 MG/ML IV BOLUS
INTRAVENOUS | Status: AC
  Filled 2022-05-18: qty 20

## 2022-05-18 MED ORDER — ONDANSETRON HCL 4 MG/2ML IJ SOLN
INTRAMUSCULAR | Status: DC | PRN
  Administered 2022-05-18: 4 mg via INTRAVENOUS

## 2022-05-18 MED ORDER — DEXAMETHASONE SODIUM PHOSPHATE 10 MG/ML IJ SOLN
INTRAMUSCULAR | Status: AC
  Filled 2022-05-18: qty 1

## 2022-05-18 MED ORDER — DIPHENHYDRAMINE HCL 25 MG PO CAPS: 25.0000 mg | ORAL_CAPSULE | Freq: Four times a day (QID) | ORAL | Status: DC | PRN

## 2022-05-18 SURGICAL SUPPLY — 39 items
BAG COUNTER SPONGE SURGICOUNT (BAG) ×1 IMPLANT
BINDER ABDOMINAL 12 ML 46-62 (SOFTGOODS) IMPLANT
CANISTER SUCT 3000ML PPV (MISCELLANEOUS) ×1 IMPLANT
CHLORAPREP W/TINT 26 (MISCELLANEOUS) ×1 IMPLANT
COVER SURGICAL LIGHT HANDLE (MISCELLANEOUS) ×1 IMPLANT
DERMABOND ADVANCED .7 DNX12 (GAUZE/BANDAGES/DRESSINGS) IMPLANT
DRAIN CHANNEL 19F RND (DRAIN) IMPLANT
DRAPE LAPAROSCOPIC ABDOMINAL (DRAPES) ×1 IMPLANT
ELECT CAUTERY BLADE 6.4 (BLADE) ×1 IMPLANT
ELECT REM PT RETURN 9FT ADLT (ELECTROSURGICAL) ×1
ELECTRODE REM PT RTRN 9FT ADLT (ELECTROSURGICAL) ×1 IMPLANT
EVACUATOR SILICONE 100CC (DRAIN) IMPLANT
GAUZE PAD ABD 8X10 STRL (GAUZE/BANDAGES/DRESSINGS) IMPLANT
GAUZE SPONGE 4X4 12PLY STRL (GAUZE/BANDAGES/DRESSINGS) IMPLANT
GLOVE BIO SURGEON STRL SZ8 (GLOVE) ×1 IMPLANT
GLOVE BIOGEL PI IND STRL 8 (GLOVE) ×1 IMPLANT
GOWN STRL REUS W/ TWL LRG LVL3 (GOWN DISPOSABLE) ×1 IMPLANT
GOWN STRL REUS W/ TWL XL LVL3 (GOWN DISPOSABLE) ×1 IMPLANT
GOWN STRL REUS W/TWL LRG LVL3 (GOWN DISPOSABLE) ×1
GOWN STRL REUS W/TWL XL LVL3 (GOWN DISPOSABLE) ×1
KIT BASIN OR (CUSTOM PROCEDURE TRAY) ×1 IMPLANT
KIT TURNOVER KIT B (KITS) ×1 IMPLANT
MESH ULTRAPRO 6X6 15CM15CM (Mesh General) IMPLANT
NDL HYPO 25GX1X1/2 BEV (NEEDLE) IMPLANT
NEEDLE HYPO 25GX1X1/2 BEV (NEEDLE) IMPLANT
NS IRRIG 1000ML POUR BTL (IV SOLUTION) ×1 IMPLANT
PACK GENERAL/GYN (CUSTOM PROCEDURE TRAY) ×1 IMPLANT
PAD ARMBOARD 7.5X6 YLW CONV (MISCELLANEOUS) ×1 IMPLANT
PENCIL SMOKE EVACUATOR (MISCELLANEOUS) ×1 IMPLANT
STAPLER VISISTAT 35W (STAPLE) IMPLANT
SUT ETHILON 2 0 FS 18 (SUTURE) IMPLANT
SUT MON AB 4-0 PC3 18 (SUTURE) IMPLANT
SUT NOVA NAB DX-16 0-1 5-0 T12 (SUTURE) ×2 IMPLANT
SUT PDS AB 1 CTX 36 (SUTURE) ×2 IMPLANT
SUT PDS AB 2-0 CT2 27 (SUTURE) IMPLANT
SYR CONTROL 10ML LL (SYRINGE) IMPLANT
TOWEL GREEN STERILE (TOWEL DISPOSABLE) ×1 IMPLANT
TOWEL GREEN STERILE FF (TOWEL DISPOSABLE) ×1 IMPLANT
TRAY FOLEY MTR SLVR 16FR STAT (SET/KITS/TRAYS/PACK) IMPLANT

## 2022-05-18 NOTE — Transfer of Care (Signed)
Immediate Anesthesia Transfer of Care Note  Patient: [name]  Procedure(s) Performed: REPAIR INCISIONAL HERNIA WITH MESH (Abdomen)  Patient Location: PACU  Anesthesia Type:General  Level of Consciousness: drowsy and patient cooperative  Airway & Oxygen Therapy: Patient Spontanous Breathing and Patient connected to face mask oxygen  Post-op Assessment: Report given to RN and Post -op Vital signs reviewed and stable  Post vital signs: Reviewed and stable  Last Vitals:  Vitals Value Taken Time  BP 113/94 05/18/22 1115  Temp    Pulse 89 05/18/22 1117  Resp 18 05/18/22 1117  SpO2 92 % 05/18/22 1117  Vitals shown include unvalidated device data.  Last Pain:  Vitals:   05/18/22 0749  TempSrc:   PainSc: 7       Patients Stated Pain Goal: 2 (08/17/01 4961)  Complications: No notable events documented.

## 2022-05-18 NOTE — H&P (Signed)
History of Present Illness: [name] is a 71 y.o. female who is seen today as an office consultation for evaluation of Hernia .   Patient presents for evaluation of incisional hernia. She said multiple abdominal operations last being in February 2022 at [facility] due to what sounds like a colonic volvulus with resection. She is recovered from that but is developed a bulge along her incision. Location is mid abdomen. No nausea or vomiting but increasing discomfort is noted. No change in bowel or bladder function. Of note she has a history of an open cholecystectomy as well. No history of bowel obstruction from this.  Review of Systems: A complete review of systems was obtained from the patient. I have reviewed this information and discussed as appropriate with the patient. See HPI as well for other ROS.    Medical History: Past Medical History:  Diagnosis Date   Arthritis   DVT (deep venous thrombosis) (CMS-HCC)   History of stroke   Hypertension   Thyroid disease   There is no problem list on file for this patient.  Past Surgical History:  Procedure Laterality Date   APPENDECTOMY   CESAREAN SECTION  2 c-sections   knee replacement suregery   LAPAROSCOPIC STOMACH INTESTINAL PYLORUS-SPARING SURGERY  2 surgeries   REDUCTION MAMMAPLASTY   rotator cuff surgery Bilateral    Allergies  Allergen Reactions   Codeine Nausea And Vomiting and Vomiting   Amitriptyline Dizziness and Other (See Comments)  Loopy feeling Loopy feeling Loopy feeling Loopy feeling Loopy feeling Loopy feeling Loopy feeling Loopy feeling   Cefuroxime Axetil Other (See Comments)  Stomach pain  Stomach pain  Stomach pain  Stomach pain  Stomach pain  Stomach pain  Stomach pain  Stomach pain    Eszopiclone Other (See Comments), Dizziness and Nausea  Causes dizziness Causes dizziness Causes dizziness Causes dizziness Causes dizziness Causes dizziness Causes dizziness Causes  dizziness   Morphine Other (See Comments)  Flushing and feeling hot Tolerates with Diphenhydramine Flushing and feeling hot Flushing and feeling hot Tolerates with Diphenhydramine Flushing and feeling hot   Omega-3 Acid Ethyl Esters Other (See Comments)  Stomach pain  Stomach pain  Stomach pain  Stomach pain  Stomach pain  Stomach pain  Stomach pain  Stomach pain    Penicillins Other (See Comments) and Rash  headaches headaches Caused Headaches also Has patient had a PCN reaction causing immediate rash, facial/tongue/throat swelling, SOB or lightheadedness with hypotension: No Has patient had a PCN reaction causing severe rash involving mucus membranes or skin necrosis: No Has patient had a PCN reaction that required hospitalization No Has patient had a PCN reaction occurring within the last 10 years: No If all of the above answers are "NO", then may proceed with Cephalosporin use. Caused Headaches also Has patient had a PCN reaction causing immediate rash, facial/tongue/throat swelling, SOB or lightheadedness with hypotension: No Has patient had a PCN reaction causing severe rash involving mucus membranes or skin necrosis: No Has patient had a PCN reaction that required hospitalization No Has patient had a PCN reaction occurring within the last 10 years: No If all of the above answers are "NO", then may proceed with Cephalosporin use. headaches headaches Caused Headaches also Has patient had a PCN reaction causing immediate rash, facial/tongue/throat swelling, SOB or lightheadedness with hypotension: No Has patient had a PCN reaction causing severe rash involving mucus membranes or skin necrosis: No Has patient had a PCN reaction that required hospitalization No Has patient had  a PCN reaction occurring within the last 10 years: No If all of the above answers are "NO", then may proceed with Cephalosporin use.   Suvorexant Other (See Comments)   ineffective ineffective Other reaction(s): Other ineffective ineffective ineffective Other reaction(s): Other ineffective   Zolpidem Other (See Comments)  Up sleep walking and eating Up sleep walking and eating Up sleep walking and eating Up sleep walking and eating Up sleep walking and eating Up sleep walking and eating Eating at night Up sleep walking and eating Up sleep walking and eating   Crestor [Rosuvastatin] Muscle Pain   Dilaudid [Hydromorphone] Other (See Comments)   Lipitor [Atorvastatin] Muscle Pain   Metformin Hcl Nausea   Pregabalin Nausea and Other (See Comments)   Current Outpatient Medications on File Prior to Visit  Medication Sig Dispense Refill   amLODIPine (NORVASC) 2.5 MG tablet take 1 tablet by mouth once a day 90 days   azelastine (ASTELIN) 137 mcg nasal spray 1-2 sprays in each nostril   ciprofloxacin HCl (CIPRO) 250 MG tablet 1 tablet   FUROsemide (LASIX) 20 MG tablet Take by mouth   gabapentin (NEURONTIN) 300 MG capsule 3 capsule   imipramine (TOFRANIL) 25 MG tablet   irbesartan-hydroCHLOROthiazide (AVALIDE) 300-12.5 mg tablet Take 1 tablet by mouth once daily   levothyroxine (SYNTHROID) 175 MCG tablet 1 tablet in the morning on an empty stomach   LORazepam (ATIVAN) 0.5 MG tablet 1 tablet as needed for anxiety   metoprolol succinate (TOPROL-XL) 25 MG XL tablet 1 tablet   metroNIDAZOLE (FLAGYL) 500 MG tablet 1 tablet   nystatin (MYCOSTATIN) 100,000 unit/gram powder 1 application   pantoprazole (PROTONIX) 40 MG DR tablet Take 1 tablet by mouth once daily   promethazine (PHENERGAN) 25 MG tablet 1 tablet as needed   rosuvastatin (CRESTOR) 5 MG tablet Take by mouth   temazepam (RESTORIL) 15 mg capsule 1 capsule   acetaminophen (TYLENOL) 500 MG tablet Take by mouth   aspirin 81 MG EC tablet Take by mouth   DULoxetine (CYMBALTA) 60 MG DR capsule Take by mouth   insulin GLARGINE (LANTUS U-100 INSULIN) injection (concentration 100 units/mL) 40 units  (titrate as directed, max daily dose of 55 units)   insulin LISPRO (HUMALOG KWIKPEN) pen injector (concentration 100 units/mL) Inject subcutaneously   meclizine (ANTIVERT) 12.5 mg tablet 2 tablets as needed   metFORMIN (GLUCOPHAGE-XR) 750 MG XR tablet TAKE 1 TABLET BY MOUTH EVERY DAY WITH EVENING MEAL   oxyCODONE-acetaminophen (PERCOCET) 5-325 mg tablet Take 1 tablet by mouth every 6 (six) hours as needed for Pain   vitamin B12-folic acid 2.6-9 mg Tab 2572mg 1 tablet   No current facility-administered medications on file prior to visit.   Family History  Problem Relation Age of Onset   Stroke Mother   High blood pressure (Hypertension) Mother   Diabetes Mother   Deep vein thrombosis (DVT or abnormal blood clot formation) Mother   Breast cancer Mother   Skin cancer Father   Obesity Father   High blood pressure (Hypertension) Father   Hyperlipidemia (Elevated cholesterol) Father   Coronary Artery Disease (Blocked arteries around heart) Father   Diabetes Father   Obesity Brother   High blood pressure (Hypertension) Brother   Diabetes Brother    Social History   Tobacco Use  Smoking Status Never  Smokeless Tobacco Never    Social History   Socioeconomic History   Marital status: Married  Tobacco Use   Smoking status: Never   Smokeless tobacco: Never  Vaping Use   Vaping Use: Never used  Substance and Sexual Activity   Alcohol use: Never   Drug use: Never   Objective:   Vitals:  11/29/21 1512  BP: 114/78  Pulse: 68  Temp: 36.6 C (97.8 F)  SpO2: 96%  Weight: 100.3 kg (221 lb 2 oz)  Height: 162.6 cm ('5\' 4"'$ )   Body mass index is 37.96 kg/m.  Physical Exam Constitutional:  Appearance: Normal appearance.  HENT:  Head: Normocephalic.  Eyes:  Pupils: Pupils are equal, round, and reactive to light.  Cardiovascular:  Rate and Rhythm: Normal rate.  Abdominal:   Comments: Scars noted. Large bulge central abdomen measuring approximately 10 cm in maximal  diameter. Significant abdominal wall diastases noted. No rebound or guarding. Bulge is soft and reducible most notable around the umbilical region.  Musculoskeletal:  General: Normal range of motion.  Cervical back: Normal range of motion and neck supple.  Skin: General: Skin is warm.  Neurological:  General: No focal deficit present.  Mental Status: She is alert.  Psychiatric:  Mood and Affect: Mood normal.     Assessment and Plan:   Diagnoses and all orders for this visit:  Incisional hernia without obstruction or gangrene    Given patient's body habitus, BMI and age, recommend CT scan to further evaluate her abdominal wall anatomy. I suspect she will need an open repair since she has lost significant domain and has significant amount of weakening abdominal wall and and diastases. I discussed laparoscopic and open approaches but I think a CT scan will help determine what makes more sense. She has a history of multiple abdominal operations and bowel resection so adhesions were discussed as well as potential complications of surgery. Pathophysiology, recovery time, procedure time, use of mesh and overall operations were discussed.  The risk of hernia repair include bleeding, Infection, Recurrence of the hernia, Mesh use, chronic pain, Organ injury, Bowel injury, Bladder injury, nerve injury with numbness around the incision, Death, and worsening of preexisting medical problems. The alternatives to surgery have been discussed as well.. Long term expectations of both operative and non operative treatments have been discussed. The patient agrees to proceed.  No follow-ups on file.  Kennieth Francois, MD

## 2022-05-18 NOTE — Interval H&P Note (Signed)
History and Physical Interval Note:  05/18/2022 8:27 AM  [name]  has presented today for surgery, with the diagnosis of INCISIONAL HERNIA, REDUCIBLE.  The various methods of treatment have been discussed with the patient and family. After consideration of risks, benefits and other options for treatment, the patient has consented to  Procedure(s) with comments: [place] (N/A) - TAP BLOCK as a surgical intervention.  The patient's history has been reviewed, patient examined, no change in status, stable for surgery.  I have reviewed the patient's chart and labs.  Questions were answered to the patient's satisfaction.     Burns

## 2022-05-18 NOTE — Op Note (Addendum)
Preoperative diagnosis: Incisional hernia nonobstructed reducible measuring 10 cm x 10 cm  Postoperative diagnosis: Same  Procedure: Open repair of reducible nonobstructed incisional hernia with mesh in myofascial advancement flaps measuring 10 cm x 10 cm  Surgeon: Erroll Luna, MD  Assistant: Dr. Bayard Hugger MD  I was personally present and performed  the key and critical portions of this procedure and immediately available throughout the entire procedure, as documented in my operative note.   Anesthesia: General with Exparel used as a block a total of 40 cc injected 20 cc Exparel diluted by 20 cc of saline  Drains: 19 round  Specimen: None  EBL 60 cc  Indications for procedure: The patient is a 71 year old female with multiple comorbidities who underwent emergent laparotomy in 2022 at [facility] for colonic volvulus.  She developed an incisional hernia.  She has poorly controlled diabetes and is taken about 6 months to maximize her overall medical condition.  She does have obesity and loss of abdominal domain.  Long discussion about techniques with the patient have been done last few months as well as trying to maximize her glucose control to minimize her complications.  Described potential issues with her diabetes and surgery to include infections, poor wound healing, recurrence rates due to her high BMI and injury to the intestine from the surgery.  Discussed bowel obstructions and the need for future treatments.  I did not feel laparoscopic approach would be helpful given her poor overall abdominal wall condition and felt that an open repair with potential myofascial advancement flaps would be helpful to try to restore as much anatomy as we could.  Risks and benefits to hernia surgery reviewed as well as observation since she had no signs of obstruction.  We discussed the role of weight loss but given her poor mobility this did not feel beneficial.  Also, we had a long discussion about  potential complications of not fixing the hernia.  After all of these lengthy conversations and reviewing all of her options she was to proceed.The risk of hernia repair include bleeding,  Infection,   Recurrence of the hernia,  Mesh use, chronic pain,  Organ injury,  Bowel injury,  Bladder injury,   nerve injury with numbness around the incision,  Death,  and worsening of preexisting  medical problems.  The alternatives to surgery have been discussed as well..  Long term expectations of both operative and non operative treatments have been discussed.   The patient agrees to proceed.         Description of procedure: The patient was met in the holding area and questions were answered.  She was taken back to the operating room placed upon upon the OR table.  After induction of general anesthesia, the abdomen is prepped and draped in a sterile fashion timeout performed.  Proper patient, site and procedure were verified.  There is a previous midline scar and this was opened about 4 cm above the umbilicus and extended just below the umbilicus.  Dissection was carried down and then there were multiple Swiss cheese defects primarily above the umbilicus up toward the top the incision which was about two thirds of the way to the xiphoid process.  This was opened in its entirety.  I was able to get in to multiple Swiss cheese defects and open up the fascia.  I made this into 110 cm defect from just above the umbilicus to the top of the incision previous scar.  There are multiple intra-abdominal adhesions  taken down carefully with sharp dissection and cautery.  Care was taken not to injure the bowel this was carefully inspected no assault no evidence of bowel injury after about a 30-minute adhesiolysis.  Once the undersurface the fascia was cleared she had significant retraction of her of her left rectus muscle specially superiorly away from the incision.  I went ahead and felt that mobilization of posterior sheath  would be helpful and went ahead and mobilized the posterior sheath on both sides using cautery off the posterior aspect of the rectus muscles.  She had a surprisingly strong posterior sheath.  This was done circumferentially.  I then opted to close this with 2-0 PDS.  Once this was closed I used a 15 x 15 cm mesh to cut down to about 12 to 13 x 12 to 13.  This was placed in the retrorectus position.  Interrupted 2-0 Novafil used to keep the mesh in place in the space.  There was good hemostasis in the space.  Once the mesh was secured I reinspected the posterior closure and it was in place without undue tension.  There is no tears in the posterior sheath that I could see.  Irrigation was used.  Hemostasis was checked again and found to be excellent.  We then through separate stab incision and placed a 19 round drain into the space.  I then closed the anterior sheath of the rectus muscle over the mesh with a #1 single-stranded PDS in a running fashion.  The subcutaneous tissues were irrigated.  I opted to close a deep layer of fatty tissue to obliterate dead space with 2-0 Vicryl.  Skin staples were used to close the skin.  The drain was secured with 2-0 nylon.  Abdominal binder was placed.  All counts were found to be correct.  The patient was then awoke extubated taken to recovery in satisfactory condition.  There were no immediate complications noted.

## 2022-05-18 NOTE — Anesthesia Procedure Notes (Signed)
Procedure Name: Intubation Date/Time: 05/18/2022 8:48 AM  Performed by: Lance Coon, CRNAPre-anesthesia Checklist: Patient identified, Emergency Drugs available, Suction available and Patient being monitored Patient Re-evaluated:Patient Re-evaluated prior to induction Oxygen Delivery Method: Circle System Utilized Preoxygenation: Pre-oxygenation with 100% oxygen Induction Type: IV induction Ventilation: Mask ventilation without difficulty Laryngoscope Size: Glidescope and 3 Grade View: Grade I Tube type: Oral Tube size: 7.0 mm Number of attempts: 1 Airway Equipment and Method: Stylet Placement Confirmation: ETT inserted through vocal cords under direct vision, positive ETCO2 and breath sounds checked- equal and bilateral Secured at: 22 cm Tube secured with: Tape Dental Injury: Teeth and Oropharynx as per pre-operative assessment  Comments: Performed by Encarnacion Chu, SRNA

## 2022-05-18 NOTE — Anesthesia Postprocedure Evaluation (Signed)
Anesthesia Post Note  Patient: [name]  Procedure(s) Performed: REPAIR INCISIONAL HERNIA WITH MESH (Abdomen)     Patient location during evaluation: PACU Anesthesia Type: General Level of consciousness: awake and alert Pain management: pain level controlled Vital Signs Assessment: post-procedure vital signs reviewed and stable Respiratory status: spontaneous breathing, nonlabored ventilation and respiratory function stable Cardiovascular status: stable and blood pressure returned to baseline Anesthetic complications: no   No notable events documented.  Last Vitals:  Vitals:   05/18/22 1315 05/18/22 1330  BP: 121/71 121/75  Pulse: 91 92  Resp: 20 18  Temp: 36.5 C   SpO2: 94% 93%    Last Pain:  Vitals:   05/18/22 1315  TempSrc:   PainSc: 0-No pain                 Audry Pili

## 2022-05-19 ENCOUNTER — Encounter (HOSPITAL_COMMUNITY): Payer: Self-pay | Admitting: Surgery

## 2022-05-19 DIAGNOSIS — K432 Incisional hernia without obstruction or gangrene: Secondary | ICD-10-CM | POA: Diagnosis not present

## 2022-05-19 LAB — GLUCOSE, CAPILLARY
Glucose-Capillary: 193 mg/dL — ABNORMAL HIGH (ref 70–99)
Glucose-Capillary: 216 mg/dL — ABNORMAL HIGH (ref 70–99)
Glucose-Capillary: 236 mg/dL — ABNORMAL HIGH (ref 70–99)
Glucose-Capillary: 266 mg/dL — ABNORMAL HIGH (ref 70–99)
Glucose-Capillary: 280 mg/dL — ABNORMAL HIGH (ref 70–99)
Glucose-Capillary: 298 mg/dL — ABNORMAL HIGH (ref 70–99)

## 2022-05-19 LAB — COMPREHENSIVE METABOLIC PANEL
ALT: 12 U/L (ref 0–44)
AST: 14 U/L — ABNORMAL LOW (ref 15–41)
Albumin: 3 g/dL — ABNORMAL LOW (ref 3.5–5.0)
Alkaline Phosphatase: 71 U/L (ref 38–126)
Anion gap: 8 (ref 5–15)
BUN: 24 mg/dL — ABNORMAL HIGH (ref 8–23)
CO2: 27 mmol/L (ref 22–32)
Calcium: 9.3 mg/dL (ref 8.9–10.3)
Chloride: 100 mmol/L (ref 98–111)
Creatinine, Ser: 1.22 mg/dL — ABNORMAL HIGH (ref 0.44–1.00)
GFR, Estimated: 47 mL/min — ABNORMAL LOW (ref >60)
Glucose, Bld: 265 mg/dL — ABNORMAL HIGH (ref 70–99)
Potassium: 5.3 mmol/L — ABNORMAL HIGH (ref 3.5–5.1)
Sodium: 135 mmol/L (ref 135–145)
Total Bilirubin: 0.7 mg/dL (ref 0.3–1.2)
Total Protein: 5.9 g/dL — ABNORMAL LOW (ref 6.5–8.1)

## 2022-05-19 LAB — CBC
HCT: 35.8 % — ABNORMAL LOW (ref 36.0–46.0)
Hemoglobin: 12.2 g/dL (ref 12.0–15.0)
MCH: 28.3 pg (ref 26.0–34.0)
MCHC: 34.1 g/dL (ref 30.0–36.0)
MCV: 83.1 fL (ref 80.0–100.0)
Platelets: 220 10*3/uL (ref 150–400)
RBC: 4.31 MIL/uL (ref 3.87–5.11)
RDW: 12.5 % (ref 11.5–15.5)
WBC: 11.5 10*3/uL — ABNORMAL HIGH (ref 4.0–10.5)
nRBC: 0 % (ref 0.0–0.2)

## 2022-05-19 MED ORDER — POLYETHYLENE GLYCOL 3350 17 G PO PACK
17.0000 g | PACK | Freq: Every day | ORAL | Status: DC
  Administered 2022-05-19: 17 g via ORAL
  Filled 2022-05-19 (×2): qty 1

## 2022-05-19 NOTE — Progress Notes (Signed)
1 Day Post-Op   Subjective/Chief Complaint: Doing well.  Good pain control.  No nausea or vomiting   Objective: Vital signs in last 24 hours: Temp:  [97.7 F (36.5 C)-98.4 F (36.9 C)] 98.4 F (36.9 C) (10/12 0756) Pulse Rate:  [87-98] 87 (10/12 0756) Resp:  [16-20] 18 (10/12 0756) BP: (108-127)/(59-94) 123/61 (10/12 0756) SpO2:  [90 %-96 %] 96 % (10/12 0756) Last BM Date : 05/17/22  Intake/Output from previous day: 10/11 0701 - 10/12 0700 In: 1480 [P.O.:480; I.V.:1000] Out: 790 [Urine:700; Drains:70; Blood:20] Intake/Output this shift: No intake/output data recorded.  General appearance: alert and cooperative Resp: clear to auscultation bilaterally Cardio: Normal sinus rhythm Incision/Wound: Binder in place.  Dressing dry.  JP drain serous and about 70 cc.  Lab Results:  Recent Labs    05/18/22 0747 05/19/22 0211  WBC 7.3 11.5*  HGB 12.3 12.2  HCT 36.4 35.8*  PLT 224 220   BMET Recent Labs    05/18/22 0747 05/19/22 0211  NA 139 135  K 3.6 5.3*  CL 103 100  CO2 25 27  GLUCOSE 177* 265*  BUN 23 24*  CREATININE 1.31* 1.22*  CALCIUM 9.7 9.3   PT/INR No results for input(s): "LABPROT", "INR" in the last 72 hours. ABG No results for input(s): "PHART", "HCO3" in the last 72 hours.  Invalid input(s): "PCO2", "PO2"  Studies/Results: No results found.  Anti-infectives: Anti-infectives (From admission, onward)    Start     Dose/Rate Route Frequency Ordered Stop   05/18/22 0700  vancomycin (VANCOCIN) IVPB 1000 mg/200 mL premix        1,000 mg 200 mL/hr over 60 Minutes Intravenous On call to O.R. 05/18/22 0659 05/18/22 0850       Assessment/Plan: s/p Procedure(s): REPAIR INCISIONAL HERNIA WITH MESH (N/A) Saline lock IV  Out of bed ambulate  Advance diet  Switch to p.o. pain medication  Wean oxygen  Hopefully discharge tomorrow if she can achieve the above goals.  DVT prophylaxis-SCDs only due to HIT history      LOS: 1 day     Joyice Faster Oneal Schoenberger 05/19/2022

## 2022-05-19 NOTE — Progress Notes (Signed)
Mobility Specialist - Progress Note   05/19/22 1506  Mobility  Activity Ambulated independently in hallway  Level of Assistance Modified independent, requires aide device or extra time  Assistive Device Front wheel walker  Distance Ambulated (ft) 300 ft  Activity Response Tolerated well  $Mobility charge 1 Mobility    Pt received in bed agreeable to mobility. No complaints throughout, no physical assistance needed. Left in bed w/ call bell in reach and all needs met.   Paulla Dolly Mobility Specialist

## 2022-05-19 NOTE — TOC Initial Note (Signed)
Transition of Care [facility]) - Initial/Assessment Note    Patient Details  Name: [name] MRN: [id number] Date of Birth: November 13, 1950  Transition of Care [facility]) CM/SW Contact:    Marilu Favre, RN Phone Number: 05/19/2022, 10:13 AM  Clinical Narrative:                  Transition of Care (TOC) Screening Note     Transition of Care Department [facility]) has reviewed patient and no TOC needs have been identified at this time. We will continue to monitor patient advancement through interdisciplinary progression rounds. If new patient transition needs arise, please place a TOC consult.    Expected Discharge Plan: Home/Self Care Barriers to Discharge: Continued Medical Work up   Patient Goals and CMS Choice Patient states their goals for this hospitalization and ongoing recovery are:: to return to home      Expected Discharge Plan and Services Expected Discharge Plan: Home/Self Care       Living arrangements for the past 2 months: Single Family Home                 DME Arranged: N/A         HH Arranged: NA          Prior Living Arrangements/Services Living arrangements for the past 2 months: Single Family Home Lives with:: Spouse Patient language and need for interpreter reviewed:: Yes Do you feel safe going back to the place where you live?: Yes      Need for Family Participation in Patient Care: Yes (Comment) Care giver support system in place?: Yes (comment)   Criminal Activity/Legal Involvement Pertinent to Current Situation/Hospitalization: No - Comment as needed  Activities of Daily Living Home Assistive Devices/Equipment: CBG Meter ADL Screening (condition at time of admission) Patient's cognitive ability adequate to safely complete daily activities?: Yes Is the patient deaf or have difficulty hearing?: No Does the patient have difficulty seeing, even when wearing glasses/contacts?: No Does the patient have difficulty concentrating, remembering, or  making decisions?: No Patient able to express need for assistance with ADLs?: Yes Does the patient have difficulty dressing or bathing?: No Independently performs ADLs?: Yes (appropriate for developmental age) Does the patient have difficulty walking or climbing stairs?: No Weakness of Legs: None Weakness of Arms/Hands: None  Permission Sought/Granted   Permission granted to share information with : No              Emotional Assessment Appearance:: Appears older than stated age Attitude/Demeanor/Rapport: Engaged Affect (typically observed): Accepting Orientation: : Oriented to Self, Oriented to Place, Oriented to  Time, Oriented to Situation Alcohol / Substance Use: Not Applicable Psych Involvement: No (comment)  Admission diagnosis:  Incisional hernia without obstruction or gangrene [K43.2] Patient Active Problem List   Diagnosis Date Noted   Incisional hernia without obstruction or gangrene 05/18/2022   CAD (coronary artery disease), native coronary artery    Diabetic hyperosmolar non-ketotic state ([place]) 05/15/2018   Hyperlipidemia 05/15/2018   GERD (gastroesophageal reflux disease) 05/15/2018   Depression with anxiety 05/15/2018   Cerebral thrombosis with cerebral infarction 11/22/2017   Acute encephalopathy    DKA (diabetic ketoacidoses) 11/18/2017   Diabetic ketoacidosis ([place]) 11/18/2017   Acute respiratory failure with hypoxia (HCC)    Acute renal failure ([place])    Endotracheal tube present    Septic shock ([place])    Spondylolisthesis of lumbar region 11/15/2017   PAC (premature atrial contraction) 09/04/2017   Cervical spondylosis with radiculopathy 03/23/2017  Hypoxia    Villous adenoma of right colon 04/08/2016   Leukocytosis 04/08/2016   Anemia    Diastolic dysfunction    Mild aortic insufficiency    Other nonspecific abnormal cardiovascular system function study 05/03/2013   SOB (shortness of breath) 05/01/2013   Atypical chest pain 02/28/2013   Dysphagia  02/28/2013   UTI (lower urinary tract infection) 07/06/2012   Intractable nausea and vomiting 07/06/2012   Blood loss anemia 07/06/2012   Epigastric abdominal pain 06/27/2012   Type II diabetes mellitus ([place]) 07/28/2011   Hypothyroidism 07/28/2011   Hypertension 07/28/2011   Hepatic steatosis 07/28/2011   PCP:  Harlan Stains, MD Pharmacy:   [id number] [place], [place] - [address] [place] [id number]: [phone]: [phone]    Social Determinants of Health (SDOH) Interventions    Readmission Risk Interventions     No data to display

## 2022-05-20 DIAGNOSIS — K432 Incisional hernia without obstruction or gangrene: Secondary | ICD-10-CM | POA: Diagnosis not present

## 2022-05-20 LAB — GLUCOSE, CAPILLARY
Glucose-Capillary: 113 mg/dL — ABNORMAL HIGH (ref 70–99)
Glucose-Capillary: 181 mg/dL — ABNORMAL HIGH (ref 70–99)
Glucose-Capillary: 254 mg/dL — ABNORMAL HIGH (ref 70–99)

## 2022-05-20 MED ORDER — BACLOFEN 10 MG PO TABS: 10.0000 mg | ORAL_TABLET | Freq: Two times a day (BID) | ORAL | 1 refills | Status: DC | PRN

## 2022-05-20 MED ORDER — OXYCODONE HCL 5 MG PO TABS: 5.0000 mg | ORAL_TABLET | ORAL | 0 refills | Status: DC | PRN

## 2022-05-20 NOTE — Discharge Summary (Signed)
Physician Discharge Summary  Patient ID: [name] MRN: [id number] DOB/AGE: 1951/07/16 71 y.o.  Admit date: 05/18/2022 Discharge date: 05/20/2022  Admission Diagnoses: Incisional hernia without obstruction or gangrene  Discharge Diagnoses:  Principal Problem:   Incisional hernia without obstruction or gangrene   Discharged Condition: good  Hospital Course: Patient did well after open repair of incisional hernia with mesh.  On postop day 1 she began to ambulate and her diet was advanced.  Postop day 2 she had good pain control, was off oxygen and had tolerance of her diet.  Her wound was intact.  JP drain is serous.  Vitals are normal.  She was discharged home on postop day 2 in good condition.      Treatments: surgery: Open repair of incisional hernia with mesh subfascial position with myofascial advancement flaps measuring 10 cm  Discharge Exam: Blood pressure (!) 159/75, pulse 95, temperature 98.3 F (36.8 C), temperature source Oral, resp. rate 18, height '5\' 3"'$  (1.6 m), weight 97.5 kg, SpO2 92 %. General appearance: alert and cooperative Resp: clear to auscultation bilaterally Cardio: Regular rate and rhythm without tachycardia Incision/Wound: Dressing clean dry intact.  JP drain intact.  Disposition: Discharge disposition: 01-Home or Self Care       Discharge Instructions     Diet - low sodium heart healthy   Complete by: As directed    Increase activity slowly   Complete by: As directed       Allergies as of 05/20/2022       Reactions   Dilaudid [hydromorphone Hcl] Other (See Comments)   Hallucinations/ argumentative, goes beserk   Heparin Other (See Comments)   HIT ab positive  (SRA negative 11/25/17)    Ambien [zolpidem Tartrate] Other (See Comments)   Up sleep walking and eating   Belsomra [suvorexant]    ineffective Other reaction(s): Other ineffective   Codeine Nausea And Vomiting   Morphine And Related Other (See Comments)   Flushing and  feeling hot Tolerates with Diphenhydramine   Penicillins Rash, Other (See Comments)   Caused Headaches also Has patient had a PCN reaction causing immediate rash, facial/tongue/throat swelling, SOB or lightheadedness with hypotension: No Has patient had a PCN reaction causing severe rash involving mucus membranes or skin necrosis: No Has patient had a PCN reaction that required hospitalization No Has patient had a PCN reaction occurring within the last 10 years: No If all of the above answers are "NO", then may proceed with Cephalosporin use.   Statins Other (See Comments)   Muscle weakness, cramps and aching all over body   Ceftin [cefuroxime Axetil] Other (See Comments)   Stomach pain    Elavil [amitriptyline] Other (See Comments)   Loopy feeling   Lovaza [omega-3-acid Ethyl Esters] Other (See Comments)   Stomach pain    Lunesta [eszopiclone] Other (See Comments)   Causes dizziness   Lyrica [pregabalin] Nausea Only   Metformin And Related Nausea Only        Medication List     STOP taking these medications    HYDROcodone-acetaminophen 5-325 MG tablet Commonly known as: NORCO/VICODIN   oxyCODONE-acetaminophen 5-325 MG tablet Commonly known as: PERCOCET/ROXICET       TAKE these medications    acetaminophen 500 MG tablet Commonly known as: TYLENOL Take 1,000 mg by mouth every 8 (eight) hours as needed for mild pain or headache.   amLODipine 2.5 MG tablet Commonly known as: NORVASC Take 2 tablets (5 mg total) by mouth daily. What changed:  how much to take when to take this   aspirin EC 81 MG tablet Take 81 mg by mouth daily.   baclofen 10 MG tablet Commonly known as: LIORESAL Take 1 tablet (10 mg total) by mouth 2 (two) times daily as needed for muscle spasms.   cephALEXin 500 MG capsule Commonly known as: KEFLEX Take 1 capsule (500 mg total) by mouth 4 (four) times daily.   cetirizine 10 MG tablet Commonly known as: ZYRTEC Take 10 mg by mouth at  bedtime.   Clobetasol Propionate 0.05 % external spray Commonly known as: TEMOVATE Apply 1 spray topically at bedtime as needed (scalp eczema).   cyanocobalamin 1000 MCG tablet Commonly known as: VITAMIN B12 Take 1,000 mcg by mouth in the morning.   cyclobenzaprine 10 MG tablet Commonly known as: FLEXERIL Take 1 tablet (10 mg total) by mouth 3 (three) times daily as needed for muscle spasms.   escitalopram 5 MG tablet Commonly known as: LEXAPRO Take 5 mg by mouth daily.   fluticasone 50 MCG/ACT nasal spray Commonly known as: FLONASE Place 2 sprays into both nostrils daily as needed for allergies or rhinitis.   furosemide 20 MG tablet Commonly known as: LASIX TAKE 1 TABLET (20 MG TOTAL) BY MOUTH DAILY AS NEEDED FOR FLUID OR EDEMA.   gabapentin 300 MG capsule Commonly known as: NEURONTIN Take 900 mg by mouth at bedtime.   Gvoke HypoPen 1-Pack 1 MG/0.2ML Soaj Generic drug: Glucagon Inject 1 mg into the skin as needed (hypoglycemia).   HumaLOG 100 UNIT/ML injection Generic drug: insulin lispro Inject 15-20 Units into the skin with breakfast, with lunch, and with evening meal.   ibandronate 150 MG tablet Commonly known as: BONIVA Take 150 mg by mouth every 30 (thirty) days.   imipramine 50 MG tablet Commonly known as: Tofranil Take 1 tablet (50 mg total) by mouth daily. What changed: when to take this   insulin glargine 100 UNIT/ML injection Commonly known as: LANTUS Inject 44 Units into the skin in the morning.   irbesartan-hydrochlorothiazide 300-12.5 MG tablet Commonly known as: AVALIDE Take 1 tablet by mouth in the morning.   levothyroxine 175 MCG tablet Commonly known as: SYNTHROID Take 175 mcg by mouth daily before breakfast.   LORazepam 0.5 MG tablet Commonly known as: ATIVAN Take 0.5-1 mg by mouth every 8 (eight) hours as needed for anxiety.   Magnesium 250 MG Tabs Take 250 mg by mouth in the morning.   meclizine 12.5 MG tablet Commonly known as:  ANTIVERT Take 25 mg by mouth 3 (three) times daily as needed for dizziness.   metoprolol succinate 25 MG 24 hr tablet Commonly known as: TOPROL-XL Take 1 tablet (25 mg total) by mouth daily.   ondansetron 4 MG disintegrating tablet Commonly known as: Zofran ODT Take 1 tablet (4 mg total) by mouth every 8 (eight) hours as needed for nausea or vomiting.   oxyCODONE 5 MG immediate release tablet Commonly known as: Roxicodone Take 1 tablet (5 mg total) by mouth every 4 (four) hours as needed for severe pain.   pantoprazole 40 MG tablet Commonly known as: PROTONIX Take 40 mg by mouth daily before breakfast.   Potassium 99 MG Tabs Take 99 mg by mouth in the morning.   PROBIOTIC PO Take 1 capsule by mouth in the morning.   promethazine 25 MG tablet Commonly known as: PHENERGAN Take 25 mg by mouth every 8 (eight) hours as needed for nausea or vomiting.   rosuvastatin 20 MG tablet Commonly  known as: CRESTOR Take 20 mg by mouth in the morning.   temazepam 15 MG capsule Commonly known as: RESTORIL Take 15 mg by mouth at bedtime.   tiZANidine 4 MG tablet Commonly known as: ZANAFLEX Take 4 mg by mouth every 6 (six) hours as needed for muscle spasms.   Vitamin D3 125 MCG (5000 UT) Tabs Take 5,000 Units by mouth in the morning.   zinc sulfate 220 (50 Zn) MG capsule Take 220 mg by mouth in the morning.         Signed: Turner Daniels MD 05/20/2022, 8:51 AM

## 2022-06-02 ENCOUNTER — Emergency Department (HOSPITAL_COMMUNITY): Payer: Medicare Other

## 2022-06-02 ENCOUNTER — Other Ambulatory Visit: Payer: Self-pay

## 2022-06-02 ENCOUNTER — Inpatient Hospital Stay (HOSPITAL_COMMUNITY)
Admission: EM | Admit: 2022-06-02 | Discharge: 2022-06-06 | DRG: 948 | Disposition: A | Discharge status: Home / Self Care | Payer: Medicare Other | Attending: Family Medicine | Admitting: Family Medicine

## 2022-06-02 ENCOUNTER — Encounter (HOSPITAL_COMMUNITY): Payer: Self-pay

## 2022-06-02 DIAGNOSIS — Z880 Allergy status to penicillin: Secondary | ICD-10-CM

## 2022-06-02 DIAGNOSIS — R6510 Systemic inflammatory response syndrome (SIRS) of non-infectious origin without acute organ dysfunction: Secondary | ICD-10-CM | POA: Diagnosis present

## 2022-06-02 DIAGNOSIS — Z888 Allergy status to other drugs, medicaments and biological substances status: Secondary | ICD-10-CM

## 2022-06-02 DIAGNOSIS — E119 Type 2 diabetes mellitus without complications: Secondary | ICD-10-CM

## 2022-06-02 DIAGNOSIS — R0789 Other chest pain: Secondary | ICD-10-CM | POA: Diagnosis present

## 2022-06-02 DIAGNOSIS — I5189 Other ill-defined heart diseases: Secondary | ICD-10-CM

## 2022-06-02 DIAGNOSIS — E1122 Type 2 diabetes mellitus with diabetic chronic kidney disease: Secondary | ICD-10-CM | POA: Diagnosis present

## 2022-06-02 DIAGNOSIS — I10 Essential (primary) hypertension: Secondary | ICD-10-CM | POA: Diagnosis not present

## 2022-06-02 DIAGNOSIS — Z803 Family history of malignant neoplasm of breast: Secondary | ICD-10-CM

## 2022-06-02 DIAGNOSIS — K529 Noninfective gastroenteritis and colitis, unspecified: Secondary | ICD-10-CM | POA: Diagnosis present

## 2022-06-02 DIAGNOSIS — E039 Hypothyroidism, unspecified: Secondary | ICD-10-CM | POA: Diagnosis present

## 2022-06-02 DIAGNOSIS — Z96653 Presence of artificial knee joint, bilateral: Secondary | ICD-10-CM | POA: Diagnosis present

## 2022-06-02 DIAGNOSIS — I2510 Atherosclerotic heart disease of native coronary artery without angina pectoris: Secondary | ICD-10-CM | POA: Diagnosis present

## 2022-06-02 DIAGNOSIS — F39 Unspecified mood [affective] disorder: Secondary | ICD-10-CM | POA: Diagnosis present

## 2022-06-02 DIAGNOSIS — I130 Hypertensive heart and chronic kidney disease with heart failure and stage 1 through stage 4 chronic kidney disease, or unspecified chronic kidney disease: Secondary | ICD-10-CM | POA: Diagnosis present

## 2022-06-02 DIAGNOSIS — Z794 Long term (current) use of insulin: Secondary | ICD-10-CM

## 2022-06-02 DIAGNOSIS — G8918 Other acute postprocedural pain: Secondary | ICD-10-CM | POA: Diagnosis not present

## 2022-06-02 DIAGNOSIS — G2581 Restless legs syndrome: Secondary | ICD-10-CM | POA: Diagnosis present

## 2022-06-02 DIAGNOSIS — Z981 Arthrodesis status: Secondary | ICD-10-CM

## 2022-06-02 DIAGNOSIS — I5032 Chronic diastolic (congestive) heart failure: Secondary | ICD-10-CM | POA: Diagnosis present

## 2022-06-02 DIAGNOSIS — Z1152 Encounter for screening for COVID-19: Secondary | ICD-10-CM

## 2022-06-02 DIAGNOSIS — Z79890 Hormone replacement therapy (postmenopausal): Secondary | ICD-10-CM

## 2022-06-02 DIAGNOSIS — Z7983 Long term (current) use of bisphosphonates: Secondary | ICD-10-CM

## 2022-06-02 DIAGNOSIS — E785 Hyperlipidemia, unspecified: Secondary | ICD-10-CM | POA: Diagnosis present

## 2022-06-02 DIAGNOSIS — R824 Acetonuria: Secondary | ICD-10-CM | POA: Diagnosis present

## 2022-06-02 DIAGNOSIS — A419 Sepsis, unspecified organism: Secondary | ICD-10-CM

## 2022-06-02 DIAGNOSIS — E860 Dehydration: Secondary | ICD-10-CM | POA: Diagnosis present

## 2022-06-02 DIAGNOSIS — Z8673 Personal history of transient ischemic attack (TIA), and cerebral infarction without residual deficits: Secondary | ICD-10-CM

## 2022-06-02 DIAGNOSIS — Z841 Family history of disorders of kidney and ureter: Secondary | ICD-10-CM

## 2022-06-02 DIAGNOSIS — Z8249 Family history of ischemic heart disease and other diseases of the circulatory system: Secondary | ICD-10-CM

## 2022-06-02 DIAGNOSIS — M797 Fibromyalgia: Secondary | ICD-10-CM | POA: Diagnosis present

## 2022-06-02 DIAGNOSIS — T8140XA Infection following a procedure, unspecified, initial encounter: Secondary | ICD-10-CM | POA: Diagnosis present

## 2022-06-02 DIAGNOSIS — N1831 Chronic kidney disease, stage 3a: Secondary | ICD-10-CM | POA: Diagnosis present

## 2022-06-02 DIAGNOSIS — Z833 Family history of diabetes mellitus: Secondary | ICD-10-CM

## 2022-06-02 DIAGNOSIS — Z9049 Acquired absence of other specified parts of digestive tract: Secondary | ICD-10-CM

## 2022-06-02 DIAGNOSIS — I959 Hypotension, unspecified: Secondary | ICD-10-CM | POA: Diagnosis not present

## 2022-06-02 DIAGNOSIS — Z885 Allergy status to narcotic agent status: Secondary | ICD-10-CM

## 2022-06-02 DIAGNOSIS — Z801 Family history of malignant neoplasm of trachea, bronchus and lung: Secondary | ICD-10-CM

## 2022-06-02 DIAGNOSIS — Z7982 Long term (current) use of aspirin: Secondary | ICD-10-CM

## 2022-06-02 DIAGNOSIS — Z79899 Other long term (current) drug therapy: Secondary | ICD-10-CM

## 2022-06-02 LAB — CBC
HCT: 39.3 % (ref 36.0–46.0)
Hemoglobin: 13 g/dL (ref 12.0–15.0)
MCH: 27.7 pg (ref 26.0–34.0)
MCHC: 33.1 g/dL (ref 30.0–36.0)
MCV: 83.6 fL (ref 80.0–100.0)
Platelets: 269 10*3/uL (ref 150–400)
RBC: 4.7 MIL/uL (ref 3.87–5.11)
RDW: 13 % (ref 11.5–15.5)
WBC: 18.1 10*3/uL — ABNORMAL HIGH (ref 4.0–10.5)
nRBC: 0 % (ref 0.0–0.2)

## 2022-06-02 LAB — URINALYSIS, ROUTINE W REFLEX MICROSCOPIC
Bilirubin Urine: NEGATIVE
Glucose, UA: >=500 mg/dL — AB
Hgb urine dipstick: NEGATIVE
Ketones, ur: 20 mg/dL — AB
Nitrite: NEGATIVE
Protein, ur: NEGATIVE mg/dL
Specific Gravity, Urine: 1.041 — ABNORMAL HIGH (ref 1.005–1.030)
pH: 5 (ref 5.0–8.0)

## 2022-06-02 LAB — COMPREHENSIVE METABOLIC PANEL
ALT: 11 U/L (ref 0–44)
AST: 11 U/L — ABNORMAL LOW (ref 15–41)
Albumin: 3.2 g/dL — ABNORMAL LOW (ref 3.5–5.0)
Alkaline Phosphatase: 76 U/L (ref 38–126)
Anion gap: 11 (ref 5–15)
BUN: 13 mg/dL (ref 8–23)
CO2: 25 mmol/L (ref 22–32)
Calcium: 9.1 mg/dL (ref 8.9–10.3)
Chloride: 98 mmol/L (ref 98–111)
Creatinine, Ser: 1.29 mg/dL — ABNORMAL HIGH (ref 0.44–1.00)
GFR, Estimated: 44 mL/min — ABNORMAL LOW (ref >60)
Glucose, Bld: 208 mg/dL — ABNORMAL HIGH (ref 70–99)
Potassium: 4.1 mmol/L (ref 3.5–5.1)
Sodium: 134 mmol/L — ABNORMAL LOW (ref 135–145)
Total Bilirubin: 1.3 mg/dL — ABNORMAL HIGH (ref 0.3–1.2)
Total Protein: 6.6 g/dL (ref 6.5–8.1)

## 2022-06-02 LAB — SARS CORONAVIRUS 2 BY RT PCR: SARS Coronavirus 2 by RT PCR: NEGATIVE

## 2022-06-02 LAB — CBG MONITORING, ED: Glucose-Capillary: 311 mg/dL — ABNORMAL HIGH (ref 70–99)

## 2022-06-02 LAB — GLUCOSE, CAPILLARY: Glucose-Capillary: 203 mg/dL — ABNORMAL HIGH (ref 70–99)

## 2022-06-02 LAB — LACTIC ACID, PLASMA: Lactic Acid, Venous: 1.3 mmol/L (ref 0.5–1.9)

## 2022-06-02 LAB — LIPASE, BLOOD: Lipase: 23 U/L (ref 11–51)

## 2022-06-02 MED ORDER — HYDRALAZINE HCL 20 MG/ML IJ SOLN: 10.0000 mg | INTRAMUSCULAR | Status: DC | PRN

## 2022-06-02 MED ORDER — PANTOPRAZOLE SODIUM 40 MG PO TBEC
40.0000 mg | DELAYED_RELEASE_TABLET | Freq: Every day | ORAL | Status: DC
  Administered 2022-06-03 – 2022-06-06 (×4): 40 mg via ORAL
  Filled 2022-06-02 (×4): qty 1

## 2022-06-02 MED ORDER — VANCOMYCIN HCL 2000 MG/400ML IV SOLN
2000.0000 mg | Freq: Once | INTRAVENOUS | Status: AC
  Administered 2022-06-02: 2000 mg via INTRAVENOUS
  Filled 2022-06-02: qty 400

## 2022-06-02 MED ORDER — FENTANYL CITRATE PF 50 MCG/ML IJ SOSY
50.0000 ug | PREFILLED_SYRINGE | INTRAMUSCULAR | Status: DC | PRN
  Administered 2022-06-02: 50 ug via INTRAVENOUS
  Filled 2022-06-02: qty 1

## 2022-06-02 MED ORDER — TIZANIDINE HCL 4 MG PO TABS
4.0000 mg | ORAL_TABLET | Freq: Four times a day (QID) | ORAL | Status: DC | PRN
  Administered 2022-06-02 – 2022-06-03 (×2): 4 mg via ORAL
  Filled 2022-06-02 (×2): qty 1

## 2022-06-02 MED ORDER — LACTATED RINGERS IV BOLUS
1000.0000 mL | Freq: Once | INTRAVENOUS | Status: AC
  Administered 2022-06-02: 1000 mL via INTRAVENOUS

## 2022-06-02 MED ORDER — INSULIN GLARGINE-YFGN 100 UNIT/ML ~~LOC~~ SOLN
44.0000 [IU] | Freq: Every day | SUBCUTANEOUS | Status: DC
  Filled 2022-06-02: qty 0.44

## 2022-06-02 MED ORDER — ROSUVASTATIN CALCIUM 20 MG PO TABS
20.0000 mg | ORAL_TABLET | Freq: Every morning | ORAL | Status: DC
  Administered 2022-06-03 – 2022-06-06 (×4): 20 mg via ORAL
  Filled 2022-06-02 (×5): qty 1

## 2022-06-02 MED ORDER — AMLODIPINE BESYLATE 2.5 MG PO TABS: 2.5000 mg | ORAL_TABLET | Freq: Every morning | ORAL | Status: DC

## 2022-06-02 MED ORDER — INSULIN ASPART 100 UNIT/ML IJ SOLN
0.0000 [IU] | Freq: Three times a day (TID) | INTRAMUSCULAR | Status: DC
  Administered 2022-06-02: 11 [IU] via SUBCUTANEOUS
  Administered 2022-06-03: 2 [IU] via SUBCUTANEOUS
  Administered 2022-06-03: 11 [IU] via SUBCUTANEOUS
  Administered 2022-06-03: 3 [IU] via SUBCUTANEOUS
  Administered 2022-06-04: 15 [IU] via SUBCUTANEOUS
  Administered 2022-06-04: 12 [IU] via SUBCUTANEOUS
  Administered 2022-06-05: 2 [IU] via SUBCUTANEOUS
  Administered 2022-06-05: 8 [IU] via SUBCUTANEOUS
  Administered 2022-06-06: 3 [IU] via SUBCUTANEOUS

## 2022-06-02 MED ORDER — ACETAMINOPHEN 500 MG PO TABS
1000.0000 mg | ORAL_TABLET | ORAL | Status: AC
  Administered 2022-06-02: 1000 mg via ORAL
  Filled 2022-06-02: qty 2

## 2022-06-02 MED ORDER — LORAZEPAM 1 MG PO TABS: 0.5000 mg | ORAL_TABLET | Freq: Three times a day (TID) | ORAL | Status: DC | PRN

## 2022-06-02 MED ORDER — ACETAMINOPHEN 650 MG RE SUPP: 650.0000 mg | Freq: Four times a day (QID) | RECTAL | Status: DC | PRN

## 2022-06-02 MED ORDER — GABAPENTIN 300 MG PO CAPS
600.0000 mg | ORAL_CAPSULE | Freq: Every day | ORAL | Status: DC
  Administered 2022-06-02 – 2022-06-05 (×4): 600 mg via ORAL
  Filled 2022-06-02 (×4): qty 2

## 2022-06-02 MED ORDER — SACCHAROMYCES BOULARDII 250 MG PO CAPS
250.0000 mg | ORAL_CAPSULE | Freq: Every morning | ORAL | Status: DC
  Administered 2022-06-03 – 2022-06-06 (×4): 250 mg via ORAL
  Filled 2022-06-02 (×4): qty 1

## 2022-06-02 MED ORDER — INSULIN ASPART 100 UNIT/ML IJ SOLN
0.0000 [IU] | Freq: Every day | INTRAMUSCULAR | Status: DC
  Administered 2022-06-02 – 2022-06-05 (×4): 2 [IU] via SUBCUTANEOUS

## 2022-06-02 MED ORDER — LACTATED RINGERS IV SOLN: INTRAVENOUS | Status: DC

## 2022-06-02 MED ORDER — LORATADINE 10 MG PO TABS
10.0000 mg | ORAL_TABLET | Freq: Every day | ORAL | Status: DC
  Administered 2022-06-03 – 2022-06-06 (×4): 10 mg via ORAL
  Filled 2022-06-02 (×4): qty 1

## 2022-06-02 MED ORDER — VANCOMYCIN HCL 750 MG/150ML IV SOLN
750.0000 mg | INTRAVENOUS | Status: DC
  Filled 2022-06-02: qty 150

## 2022-06-02 MED ORDER — ONDANSETRON HCL 4 MG/2ML IJ SOLN
4.0000 mg | Freq: Once | INTRAMUSCULAR | Status: AC
  Administered 2022-06-02: 4 mg via INTRAVENOUS
  Filled 2022-06-02: qty 2

## 2022-06-02 MED ORDER — INSULIN GLARGINE-YFGN 100 UNIT/ML ~~LOC~~ SOLN
44.0000 [IU] | Freq: Every day | SUBCUTANEOUS | Status: DC
  Filled 2022-06-02 (×2): qty 0.44

## 2022-06-02 MED ORDER — OXYCODONE HCL 5 MG PO TABS
5.0000 mg | ORAL_TABLET | ORAL | Status: DC | PRN
  Administered 2022-06-02 – 2022-06-04 (×3): 5 mg via ORAL
  Filled 2022-06-02 (×5): qty 1

## 2022-06-02 MED ORDER — TEMAZEPAM 15 MG PO CAPS
15.0000 mg | ORAL_CAPSULE | Freq: Every day | ORAL | Status: DC
  Administered 2022-06-02 – 2022-06-05 (×4): 15 mg via ORAL
  Filled 2022-06-02 (×4): qty 1

## 2022-06-02 MED ORDER — IMIPRAMINE HCL 25 MG PO TABS
50.0000 mg | ORAL_TABLET | Freq: Every day | ORAL | Status: DC
  Administered 2022-06-02 – 2022-06-05 (×4): 50 mg via ORAL
  Filled 2022-06-02 (×5): qty 2

## 2022-06-02 MED ORDER — ONDANSETRON 4 MG PO TBDP
4.0000 mg | ORAL_TABLET | Freq: Four times a day (QID) | ORAL | Status: DC | PRN
  Administered 2022-06-04: 4 mg via ORAL
  Filled 2022-06-02: qty 1

## 2022-06-02 MED ORDER — ASPIRIN 81 MG PO TBEC
81.0000 mg | DELAYED_RELEASE_TABLET | Freq: Every day | ORAL | Status: DC
  Administered 2022-06-03 – 2022-06-06 (×4): 81 mg via ORAL
  Filled 2022-06-02 (×4): qty 1

## 2022-06-02 MED ORDER — LEVOTHYROXINE SODIUM 75 MCG PO TABS
175.0000 ug | ORAL_TABLET | Freq: Every day | ORAL | Status: DC
  Administered 2022-06-03 – 2022-06-06 (×4): 175 ug via ORAL
  Filled 2022-06-02 (×4): qty 1

## 2022-06-02 MED ORDER — ONDANSETRON HCL 4 MG/2ML IJ SOLN
4.0000 mg | Freq: Four times a day (QID) | INTRAMUSCULAR | Status: DC | PRN
  Administered 2022-06-02 – 2022-06-04 (×3): 4 mg via INTRAVENOUS
  Filled 2022-06-02 (×4): qty 2

## 2022-06-02 MED ORDER — ESCITALOPRAM OXALATE 10 MG PO TABS
5.0000 mg | ORAL_TABLET | Freq: Every day | ORAL | Status: DC
  Administered 2022-06-03 – 2022-06-06 (×4): 5 mg via ORAL
  Filled 2022-06-02 (×4): qty 1

## 2022-06-02 MED ORDER — FENTANYL CITRATE PF 50 MCG/ML IJ SOSY
100.0000 ug | PREFILLED_SYRINGE | Freq: Once | INTRAMUSCULAR | Status: AC
  Administered 2022-06-02: 100 ug via INTRAVENOUS
  Filled 2022-06-02: qty 2

## 2022-06-02 MED ORDER — PIPERACILLIN-TAZOBACTAM 3.375 G IVPB
3.3750 g | Freq: Three times a day (TID) | INTRAVENOUS | Status: DC
  Administered 2022-06-02 – 2022-06-06 (×12): 3.375 g via INTRAVENOUS
  Filled 2022-06-02 (×12): qty 50

## 2022-06-02 MED ORDER — METOPROLOL SUCCINATE ER 25 MG PO TB24
25.0000 mg | ORAL_TABLET | Freq: Every day | ORAL | Status: DC
  Administered 2022-06-04 – 2022-06-06 (×3): 25 mg via ORAL
  Filled 2022-06-02 (×4): qty 1

## 2022-06-02 MED ORDER — IOHEXOL 350 MG/ML SOLN
50.0000 mL | Freq: Once | INTRAVENOUS | Status: AC | PRN
  Administered 2022-06-02: 50 mL via INTRAVENOUS

## 2022-06-02 MED ORDER — ACETAMINOPHEN 325 MG PO TABS: 650.0000 mg | ORAL_TABLET | Freq: Four times a day (QID) | ORAL | Status: DC | PRN

## 2022-06-02 NOTE — Progress Notes (Signed)
Pharmacy Antibiotic Note  [name] is a 71 y.o. female admitted on 06/02/2022 with abdominal pain and sepsis.  Pharmacy has been consulted for Vancomycin and zosyn dosing.  Plan: Vancomycin '2000mg'$  once then '750mg'$  Q24h (eAUC 519, Scr 1.29) Zosyn 3.375g IV over 4 hours Q8h Monitor renal function and culture results for pathogen directed therapy  Height: '5\' 3"'$  (160 cm) Weight: 90.7 kg (200 lb) IBW/kg (Calculated) : 52.4  Temp (24hrs), Avg:99.5 F (37.5 C), Min:99.4 F (37.4 C), Max:99.5 F (37.5 C)  Recent Labs  Lab 06/02/22 1011  WBC 18.1*  CREATININE 1.29*  LATICACIDVEN 1.3    Estimated Creatinine Clearance: 42.7 mL/min (A) (by C-G formula based on SCr of 1.29 mg/dL (H)).    Allergies  Allergen Reactions   Dilaudid [Hydromorphone Hcl] Other (See Comments)    Hallucinations/ argumentative, goes beserk   Heparin Other (See Comments)    HIT ab positive  (SRA negative 11/25/17)    Ambien [Zolpidem Tartrate] Other (See Comments)    Up sleep walking and eating   Belsomra [Suvorexant]     ineffective Other reaction(s): Other ineffective   Codeine Nausea And Vomiting   Morphine And Related Other (See Comments)    Flushing and feeling hot Tolerates with Diphenhydramine   Penicillins Rash and Other (See Comments)    Caused Headaches also Has patient had a PCN reaction causing immediate rash, facial/tongue/throat swelling, SOB or lightheadedness with hypotension: No Has patient had a PCN reaction causing severe rash involving mucus membranes or skin necrosis: No Has patient had a PCN reaction that required hospitalization No Has patient had a PCN reaction occurring within the last 10 years: No If all of the above answers are "NO", then may proceed with Cephalosporin use.     Statins Other (See Comments)    Muscle weakness, cramps and aching all over body   Ceftin [Cefuroxime Axetil] Other (See Comments)    Stomach pain    Elavil [Amitriptyline] Other (See Comments)     Loopy feeling   Lovaza [Omega-3-Acid Ethyl Esters] Other (See Comments)    Stomach pain    Lunesta [Eszopiclone] Other (See Comments)    Causes dizziness   Lyrica [Pregabalin] Nausea Only   Metformin And Related Nausea Only    Antimicrobials this admission: Vanc 10/26 >>  Zosyn 10/26 >>   Dose adjustments this admission: N/a  Microbiology results: 10/26 BCx: collected  Thank you for allowing pharmacy to be a part of this patient's care.  Titus Dubin, PharmD PGY1 Pharmacy Resident 06/02/2022 2:38 PM

## 2022-06-02 NOTE — Progress Notes (Signed)
[facility] Progress Note     Subjective: CC-  [name] is a 71yo female PMH CAD, hx CVA, DM, chronic diastolic CHF, HTN, HLD, hypothyroidism who underwent Open repair of reducible nonobstructed incisional hernia with mesh in myofascial advancement flaps measuring 10 cm x 10 cm on 05/18/22 by Dr. Brantley Stage. Initially doing well after surgery, but states that yesterday she developed abdominal pain, body aches, nausea, fever up to 99.30F, fatigue, and loss of taste. Denies emesis, constipation, or diarrhea. Abdominal pain is located predominately across lower abdomen. She had been tolerating regular diet up until yesterday but is now nauseas with PO intake. She has been having regular bowel movements - last BM yesterday. Drain has remained serosanguinous. Due to persistent pain she decided to come tho the ED for evaluation.  In the ED patient was initially tachycardic and hypertensive. CT abdomen/pelvis fairly unremarkable with expected postsurgical changes of ventral abdominal hernia repair, no intraabdominal fluid collections, and some mild inflammation of the transverse colon that is likely reactive. WBC 18.1, lactic acid 1.3. U/a and Covid test are pending. Patient was admitted to the medical service and started on broad spectrum antibiotics. General surgery asked to see.  Objective: Vital signs in last 24 hours: Temp:  [98.6 F (37 C)-99.5 F (37.5 C)] 98.6 F (37 C) (10/26 1600) Pulse Rate:  [94-111] 94 (10/26 1530) Resp:  [13-24] 20 (10/26 1530) BP: (109-160)/(71-101) 111/83 (10/26 1530) SpO2:  [92 %-97 %] 94 % (10/26 1530) Weight:  [90.7 kg] 90.7 kg (10/26 1013)    Intake/Output from previous day: No intake/output data recorded. Intake/Output this shift: Total I/O In: 1000 [IV Piggyback:1000] Out: -   PE: Gen:  Alert, NAD, pleasant HEENT: EOM's intact, pupils equal and round Card:  RRR, no M/G/R heard Pulm:  CTAB, no W/R/R, rate and effort normal on room  air Abd: Soft, ND, +BS, no hernia, midline incision with staples intact and some mild local erythema not concerning for infection. No palpable underlying fluid collection nor purulent drainage. Drain with minimal sanguinous drainage. Mild diffuse TPP without rebound/guarding Ext:  no BUE/BLE edema, calves soft and nontender Psych: A&Ox4  Skin: no rashes noted, warm and dry  Lab Results:  Recent Labs    06/02/22 1011  WBC 18.1*  HGB 13.0  HCT 39.3  PLT 269   BMET Recent Labs    06/02/22 1011  NA 134*  K 4.1  CL 98  CO2 25  GLUCOSE 208*  BUN 13  CREATININE 1.29*  CALCIUM 9.1   PT/INR No results for input(s): "LABPROT", "INR" in the last 72 hours. CMP     Component Value Date/Time   NA 134 (L) 06/02/2022 1011   NA 133 (L) 02/18/2020 1119   K 4.1 06/02/2022 1011   CL 98 06/02/2022 1011   CO2 25 06/02/2022 1011   GLUCOSE 208 (H) 06/02/2022 1011   BUN 13 06/02/2022 1011   BUN 14 02/18/2020 1119   CREATININE 1.29 (H) 06/02/2022 1011   CALCIUM 9.1 06/02/2022 1011   PROT 6.6 06/02/2022 1011   PROT 6.5 07/10/2019 1442   ALBUMIN 3.2 (L) 06/02/2022 1011   ALBUMIN 4.1 07/10/2019 1442   AST 11 (L) 06/02/2022 1011   ALT 11 06/02/2022 1011   ALKPHOS 76 06/02/2022 1011   BILITOT 1.3 (H) 06/02/2022 1011   BILITOT 0.4 07/10/2019 1442   GFRNONAA 44 (L) 06/02/2022 1011   GFRAA 73 02/18/2020 1119   Lipase     Component Value Date/Time  LIPASE 23 06/02/2022 1011       Studies/Results: DG Chest 2 View  Result Date: 06/02/2022 CLINICAL DATA:  Postop fever EXAM: CHEST - 2 VIEW COMPARISON:  11/21/2021 FINDINGS: Heart and mediastinal contours are within normal limits. No focal opacities or effusions. No acute bony abnormality. IMPRESSION: No active cardiopulmonary disease. Electronically Signed   By: Rolm Baptise M.D.   On: 06/02/2022 13:38   CT ABDOMEN PELVIS W CONTRAST  Result Date: 06/02/2022 CLINICAL DATA:  Abdominal pain, recent hernia surgery EXAM: CT ABDOMEN AND  PELVIS WITH CONTRAST TECHNIQUE: Multidetector CT imaging of the abdomen and pelvis was performed using the standard protocol following bolus administration of intravenous contrast. RADIATION DOSE REDUCTION: This exam was performed according to the departmental dose-optimization program which includes automated exposure control, adjustment of the mA and/or kV according to patient size and/or use of iterative reconstruction technique. CONTRAST:  73m OMNIPAQUE IOHEXOL 350 MG/ML SOLN COMPARISON:  CT abdomen and pelvis dated January 28, 2022 FINDINGS: Lower chest: Small hiatal hernia. Mild bibasilar opacities, likely due to scarring or atelectasis. Coronary artery and mitral annular calcifications. Hepatobiliary: No focal liver abnormality is seen. Status post cholecystectomy. No biliary dilatation. Pancreas: Unremarkable. No pancreatic ductal dilatation or surrounding inflammatory changes. Spleen: Normal in size without focal abnormality. Adrenals/Urinary Tract: Adrenal glands are unremarkable. Kidneys are normal, without renal calculi, focal lesion, or hydronephrosis. Bladder is unremarkable. Stomach/Bowel: Mild wall thickening adjacent inflammatory change of the transverse colon, likely reactive. Prior right partial hemicolectomy. No evidence of obstruction. Vascular/Lymphatic: Aortic atherosclerosis. No enlarged abdominal or pelvic lymph nodes. Reproductive: Uterus and bilateral adnexa are unremarkable. Other: Postsurgical changes of the anterior abdominal wall, including trace fluid and air and surgical drain. No free fluid or free air seen in the abdomen or pelvis. Musculoskeletal: Prior posterior fusion of the lumbar spine. No aggressive appearing osseous lesions. IMPRESSION: 1. Expected postsurgical changes of ventral abdominal hernia repair. 2. Mild wall thickening and adjacent inflammatory change of the transverse colon, likely reactive. 3. Aortic Atherosclerosis (ICD10-I70.0). Electronically Signed   By: LYetta GlassmanM.D.   On: 06/02/2022 12:27    Anti-infectives: Anti-infectives (From admission, onward)    Start     Dose/Rate Route Frequency Ordered Stop   06/03/22 1500  vancomycin (VANCOREADY) IVPB 750 mg/150 mL        750 mg 150 mL/hr over 60 Minutes Intravenous Every 24 hours 06/02/22 1445     06/02/22 1445  vancomycin (VANCOREADY) IVPB 2000 mg/400 mL        2,000 mg 200 mL/hr over 120 Minutes Intravenous  Once 06/02/22 1432     06/02/22 1445  piperacillin-tazobactam (ZOSYN) IVPB 3.375 g        3.375 g 12.5 mL/hr over 240 Minutes Intravenous Every 8 hours 06/02/22 1432          Assessment/Plan POD# 15 S/p Open repair of reducible nonobstructed incisional hernia with mesh in myofascial advancement flaps measuring 10 cm x 10 cm on 05/18/22 by Dr. CBrantley Stage - Readmit 10/26 with diffuse body aches, fatigue, abdominal pain, nausea CT scan fairly reassuring with expected postoperative changes and no intraabdominal fluid collections. JP drainage is serosanguinous. Low suspicion for intraabdominal complication causing her symptoms. Agree with medical admission. Will start trial of clear liquids. We will follow.  ID - zosyn, vancomycin FEN - IVF, clears VTE - SCDs, per primary/ ok for chemical dvt ppx from surgical standpoint Foley - none   LOS: 0 days    MRichard Miu PA-C  [facility] 06/02/2022, 4:18 PM Please see Amion for pager number during day hours 7:00am-4:30pm

## 2022-06-02 NOTE — ED Provider Triage Note (Signed)
Emergency Medicine Provider Triage Evaluation Note  [name] , a 71 y.o. female  was evaluated in triage.  Pt complains of generalized abdominal pain that began yesterday and is significantly worsened since.  She states she feels lightheaded fatigued.  She had a hernia surgery 2 weeks ago and still has a drain in her right lower abdomen.  States that she has had a fever at home  Review of Systems  Positive: Abdominal pain Negative: Headache  Physical Exam  BP 109/71 (BP Location: Right Arm)   Pulse (!) 111   Temp 99.4 F (37.4 C) (Oral)   Resp 16   SpO2 92%  Gen:   Awake, appears acutely uncomfortable.  Resp:  Normal effort  MSK:   Moves extremities without difficulty  Other:  Abd pain that is diffuse, seems to be more tender in the upper abdomen.  Voluntarily guarding.  Appears quite uncomfortable.  Medical Decision Making  Medically screening exam initiated at 10:07 AM.  Appropriate orders placed.  [name] was informed that the remainder of the evaluation will be completed by another provider, this initial triage assessment does not replace that evaluation, and the importance of remaining in the ED until their evaluation is complete.  Labs, CT abdomen pelvis ordered  Discussed with Elmyra Ricks charge RN who will move pt to major care.    Tedd Sias, [place] 06/02/22 1010

## 2022-06-02 NOTE — H&P (Addendum)
History and Physical    Patient: [name] OHY:[id number] DOB: 04-18-1951 DOA: 06/02/2022 DOS: the patient was seen and examined on 06/02/2022 PCP: Harlan Stains, MD  Patient coming from: Home - lives with husband; NOK: Pandora Leiter, [phone]   Chief Complaint: Abdominal pain  HPI: [name] is a 71 y.o. female with medical history significant of CAD; CVA; DM; chronic diastolic CHF; HTN; HLD; hypothyroidism;  and RLS presenting with abdominal pain.  She was last admitted from 10/11-13 with incisional hernia s/p repair.  She had surgery 2 weeks ago, incisional hernia repair.  She had fever and abdominal pain starting yesterday and felt faint, nauseated, unable to stand well.  It was hard to eat, no taste for about 5 days.  She was doing well after the hernia repair until then.  Fever has been 99.9.  +nausea, no vomiting or diarrhea.  RLQ > LLQ pain.  Abdominal binder is in place.  Staples are still in as is RLQ drain with serosanguinous liquid that she says is generally unchanged from the time of hospital dc.     ER Course:  h/o abdominal surgeries with recent incisional hernia repair.  Started with abdominal pain yesterday with fever, WBC 18, tachycardia, CT with mild colitis.  Sepsis protocol called, surgery will see.  Started on broad spectrum antibiotics.     Review of Systems: As mentioned in the history of present illness. All other systems reviewed and are negative. Past Medical History:  Diagnosis Date   Anemia    iron deficiency   Blood transfusion    CAD (coronary artery disease), native coronary artery    coronary CTA showed a coronary Ca score of 506, mild CAD with 25-49% mRCA and moderate CAD with 50-69% sequential mRCA, 25-49% inferior sub branch of D1 and 25-49% in pLCx.     Cervical spondylosis with myelopathy and radiculopathy    CVA (cerebral vascular accident) ([place])    11/2017 - no weakness or paralysis   Depression    Diabetes mellitus    Type 1   Diastolic  dysfunction    Diverticulitis    DVT (deep venous thrombosis) (HCC)    times 2 lower leg   Endometriosis    Fibromyalgia    GERD (gastroesophageal reflux disease)    occ   Hiatal hernia    History of kidney stones    Hyperlipidemia    Hypertension    Hypothyroidism    Mild aortic insufficiency    by echo 04/2013   Osteoarthritis    back and knee   Ovarian cyst    RLS (restless legs syndrome)    Thoracic compression fracture (HCC)    Villous adenoma of right colon 04/08/2016   Vitamin D deficiency disease    Past Surgical History:  Procedure Laterality Date   ANTERIOR CERVICAL DECOMP/DISCECTOMY FUSION N/A 03/23/2017   Procedure: ANTERIOR CERVICAL DECOMPRESSION/DISCECTOMY FUSION, INTERBODY PROSTHESIS,PLATE CERVICAL THREE- CERVICAL FOUR, CERVICAL FOUR- CERVICAL FIVE, CERVICAL FIVE- CERVICAL SIX;  Surgeon: Newman Pies, MD;  Location: [place];  Service: Neurosurgery;  Laterality: N/A;   ANTERIOR CERVICAL DECOMP/DISCECTOMY FUSION N/A 04/18/2019   Procedure: ANTERIOR CERVICAL DECOMPRESSION/DISCECTOMY  CERVICAL SIX- CERVICAL SEVEN;  Surgeon: Newman Pies, MD;  Location: [place];  Service: Neurosurgery;  Laterality: N/A;  anterior   APPENDECTOMY     BACK SURGERY  2005   April  2013 - spinal fusion@ [facility]   bowel blockage surgery     BREAST SURGERY     breast reduction  CARDIAC CATHETERIZATION  04/2013   normal coronary arteries and normal LVF   CARPAL TUNNEL RELEASE  06/05/2012   Procedure: CARPAL TUNNEL RELEASE;  Surgeon: Cammie Sickle., MD;  Location: [place];  Service: Orthopedics;  Laterality: Left;   CESAREAN SECTION     x 2   CHOLECYSTECTOMY     COLECTOMY  04/08/2016   DILATION AND CURETTAGE OF UTERUS     DORSAL COMPARTMENT RELEASE  06/05/2012   Procedure: RELEASE DORSAL COMPARTMENT (DEQUERVAIN);  Surgeon: Cammie Sickle., MD;  Location: [facility];  Service: Orthopedics;  Laterality: Left;  Excision of mixoid cyst also   EYE  SURGERY     cataracts bilateral   INCISIONAL HERNIA REPAIR N/A 05/18/2022   Procedure: REPAIR INCISIONAL HERNIA WITH MESH;  Surgeon: Erroll Luna, MD;  Location: [place];  Service: General;  Laterality: N/A;   JOINT REPLACEMENT     Bilateral knee   KNEE ARTHROPLASTY  2009   lft partial   KNEE ARTHROPLASTY     rt   LAPAROSCOPIC PARTIAL COLECTOMY N/A 04/08/2016   Procedure: LAPAROSCOPIC ASSISTED ASCENDING COLECTOMY POSSIBLE OPEN COLECTOMY;  Surgeon: Fanny Skates, MD;  Location: [place];  Service: General;  Laterality: N/A;   orthopedic surgeries     multiple   perforated bowl     Sep 09 2020   REDUCTION MAMMAPLASTY Bilateral    SHOULDER OPEN ROTATOR CUFF REPAIR     rt and lft   TEE WITHOUT CARDIOVERSION N/A 11/23/2017   Procedure: TRANSESOPHAGEAL ECHOCARDIOGRAM (TEE);  Surgeon: Jerline Pain, MD;  Location: [facility] ENDOSCOPY;  Service: Cardiovascular;  Laterality: N/A;   TUBAL LIGATION     btsp   Social History:  reports that she has never smoked. She has never used smokeless tobacco. She reports that she does not drink alcohol and does not use drugs.  Allergies  Allergen Reactions   Dilaudid [Hydromorphone Hcl] Other (See Comments)    Hallucinations/ argumentative, goes beserk   Heparin Other (See Comments)    HIT ab positive  (SRA negative 11/25/17)    Ambien [Zolpidem Tartrate] Other (See Comments)    Up sleep walking and eating   Belsomra [Suvorexant]     ineffective Other reaction(s): Other ineffective   Codeine Nausea And Vomiting   Morphine And Related Other (See Comments)    Flushing and feeling hot Tolerates with Diphenhydramine   Penicillins Rash and Other (See Comments)    Caused Headaches also Has patient had a PCN reaction causing immediate rash, facial/tongue/throat swelling, SOB or lightheadedness with hypotension: No Has patient had a PCN reaction causing severe rash involving mucus membranes or skin necrosis: No Has patient had a PCN reaction that required  hospitalization No Has patient had a PCN reaction occurring within the last 10 years: No If all of the above answers are "NO", then may proceed with Cephalosporin use.     Statins Other (See Comments)    Muscle weakness, cramps and aching all over body   Ceftin [Cefuroxime Axetil] Other (See Comments)    Stomach pain    Elavil [Amitriptyline] Other (See Comments)    Loopy feeling   Lovaza [Omega-3-Acid Ethyl Esters] Other (See Comments)    Stomach pain    Lunesta [Eszopiclone] Other (See Comments)    Causes dizziness   Lyrica [Pregabalin] Nausea Only   Metformin And Related Nausea Only    Family History  Problem Relation Age of Onset   Heart attack Father  27s   Hypothyroidism Father    Diabetes type II Father    Kidney failure Father    Diabetes type II Mother    Hypertension Mother    Breast cancer Mother 28   Dementia Mother    Hypothyroidism Brother    Diabetes Brother    Breast cancer Maternal Aunt    Breast cancer Cousin    Breast cancer Maternal Aunt    Breast cancer Cousin    High blood pressure Other        "all"   Heart attack Brother    Lung cancer Brother     Prior to Admission medications   Medication Sig Start Date End Date Taking? Authorizing Provider  acetaminophen (TYLENOL) 500 MG tablet Take 1,000 mg by mouth every 8 (eight) hours as needed for mild pain or headache.    [provider]  amLODipine (NORVASC) 2.5 MG tablet Take 2 tablets (5 mg total) by mouth daily. Patient taking differently: Take 2.5 mg by mouth in the morning. 05/25/21   Sueanne Margarita, MD  aspirin EC 81 MG tablet Take 81 mg by mouth daily.    [provider]  baclofen (LIORESAL) 10 MG tablet Take 1 tablet (10 mg total) by mouth 2 (two) times daily as needed for muscle spasms. 05/20/22 05/20/23  Cornett, Marcello Moores, MD  cephALEXin (KEFLEX) 500 MG capsule Take 1 capsule (500 mg total) by mouth 4 (four) times daily. Patient not taking: Reported on 05/04/2022  01/28/22   Azucena Cecil, PA-C  cetirizine (ZYRTEC) 10 MG tablet Take 10 mg by mouth at bedtime.    [provider]  Cholecalciferol (VITAMIN D3) 125 MCG (5000 UT) TABS Take 5,000 Units by mouth in the morning.    [provider]  Clobetasol Propionate (TEMOVATE) 0.05 % external spray Apply 1 spray topically at bedtime as needed (scalp eczema).    [provider]  cyclobenzaprine (FLEXERIL) 10 MG tablet Take 1 tablet (10 mg total) by mouth 3 (three) times daily as needed for muscle spasms. Patient not taking: Reported on 05/04/2022 04/19/19   Viona Gilmore D, NP  escitalopram (LEXAPRO) 5 MG tablet Take 5 mg by mouth daily. 03/24/22   [provider]  fluticasone (FLONASE) 50 MCG/ACT nasal spray Place 2 sprays into both nostrils daily as needed for allergies or rhinitis.    [provider]  furosemide (LASIX) 20 MG tablet TAKE 1 TABLET (20 MG TOTAL) BY MOUTH DAILY AS NEEDED FOR FLUID OR EDEMA. 09/21/21   Sueanne Margarita, MD  gabapentin (NEURONTIN) 300 MG capsule Take 900 mg by mouth at bedtime.    [provider]  Glucagon (GVOKE HYPOPEN 1-PACK) 1 MG/0.2ML SOAJ Inject 1 mg into the skin as needed (hypoglycemia).    [provider]  HUMALOG 100 UNIT/ML injection Inject 15-20 Units into the skin with breakfast, with lunch, and with evening meal. 02/03/22   [provider]  ibandronate (BONIVA) 150 MG tablet Take 150 mg by mouth every 30 (thirty) days. 03/06/19   [provider]  imipramine (TOFRANIL) 50 MG tablet Take 1 tablet (50 mg total) by mouth daily. Patient taking differently: Take 50 mg by mouth at bedtime. 01/11/22   Lomax, Amy, NP  insulin glargine (LANTUS) 100 UNIT/ML injection Inject 44 Units into the skin in the morning.    [provider]  irbesartan-hydrochlorothiazide (AVALIDE) 300-12.5 MG tablet Take 1 tablet by mouth in the morning.    [provider]  levothyroxine (SYNTHROID)  175 MCG  tablet Take 175 mcg by mouth daily before breakfast. 10/10/17   [provider]  LORazepam (ATIVAN) 0.5 MG tablet Take 0.5-1 mg by mouth every 8 (eight) hours as needed for anxiety. 03/15/19   [provider]  Magnesium 250 MG TABS Take 250 mg by mouth in the morning.    [provider]  meclizine (ANTIVERT) 12.5 MG tablet Take 25 mg by mouth 3 (three) times daily as needed for dizziness.    [provider]  metoprolol succinate (TOPROL-XL) 25 MG 24 hr tablet Take 1 tablet (25 mg total) by mouth daily. 05/25/21   Sueanne Margarita, MD  ondansetron (ZOFRAN ODT) 4 MG disintegrating tablet Take 1 tablet (4 mg total) by mouth every 8 (eight) hours as needed for nausea or vomiting. 12/02/19   Deno Etienne, DO  oxyCODONE (ROXICODONE) 5 MG immediate release tablet Take 1 tablet (5 mg total) by mouth every 4 (four) hours as needed for severe pain. 05/20/22   Cornett, Marcello Moores, MD  pantoprazole (PROTONIX) 40 MG tablet Take 40 mg by mouth daily before breakfast. 07/03/19   [provider]  Potassium 99 MG TABS Take 99 mg by mouth in the morning.    [provider]  Probiotic Product (PROBIOTIC PO) Take 1 capsule by mouth in the morning.    [provider]  promethazine (PHENERGAN) 25 MG tablet Take 25 mg by mouth every 8 (eight) hours as needed for nausea or vomiting.    [provider]  rosuvastatin (CRESTOR) 20 MG tablet Take 20 mg by mouth in the morning.    [provider]  temazepam (RESTORIL) 15 MG capsule Take 15 mg by mouth at bedtime.  10/10/17   [provider]  tiZANidine (ZANAFLEX) 4 MG tablet Take 4 mg by mouth every 6 (six) hours as needed for muscle spasms. 04/19/22   [provider]  vitamin B-12 (CYANOCOBALAMIN) 1000 MCG tablet Take 1,000 mcg by mouth in the morning.    [provider]  zinc sulfate 220 (50 Zn) MG capsule Take 220 mg by mouth in the morning.    [provider]    Physical  Exam: Vitals:   06/02/22 1715 06/02/22 1730 06/02/22 1745 06/02/22 1800  BP: 124/66 (!) 145/70 113/75 128/68  Pulse: 87 84 82 81  Resp: '17 18 20 20  '$ Temp:      TempSrc:      SpO2: 99% 99% 99% 100%  Weight:      Height:       General:  Appears calm and comfortable and is in NAD Eyes:  PERRL, EOMI, normal lids, iris ENT:  grossly normal hearing, lips & tongue, mmm Neck:  no LAD, masses or thyromegaly Cardiovascular:  RRR, no m/r/g. No LE edema.  Respiratory:   CTA bilaterally with no wheezes/rales/rhonchi.  Normal respiratory effort. Abdomen:  soft, RUQ < RLQ TTP, ND, NABS; staples in place and RLQ drain is in place    Skin:  no rash or induration seen on limited exam Musculoskeletal:  grossly normal tone BUE/BLE, good ROM, no bony abnormality Psychiatric:  blunted mood and affect, speech fluent and appropriate, AOx3 Neurologic:  CN 2-12 grossly intact, moves all extremities in coordinated fashion   Radiological Exams on Admission: Independently reviewed - see discussion in A/P where applicable  DG Chest 2 View  Result Date: 06/02/2022 CLINICAL DATA:  Postop fever EXAM: CHEST - 2 VIEW COMPARISON:  11/21/2021 FINDINGS: Heart and mediastinal contours are within  normal limits. No focal opacities or effusions. No acute bony abnormality. IMPRESSION: No active cardiopulmonary disease. Electronically Signed   By: Rolm Baptise M.D.   On: 06/02/2022 13:38   CT ABDOMEN PELVIS W CONTRAST  Result Date: 06/02/2022 CLINICAL DATA:  Abdominal pain, recent hernia surgery EXAM: CT ABDOMEN AND PELVIS WITH CONTRAST TECHNIQUE: Multidetector CT imaging of the abdomen and pelvis was performed using the standard protocol following bolus administration of intravenous contrast. RADIATION DOSE REDUCTION: This exam was performed according to the departmental dose-optimization program which includes automated exposure control, adjustment of the mA and/or kV according to patient size and/or use of iterative  reconstruction technique. CONTRAST:  44m OMNIPAQUE IOHEXOL 350 MG/ML SOLN COMPARISON:  CT abdomen and pelvis dated January 28, 2022 FINDINGS: Lower chest: Small hiatal hernia. Mild bibasilar opacities, likely due to scarring or atelectasis. Coronary artery and mitral annular calcifications. Hepatobiliary: No focal liver abnormality is seen. Status post cholecystectomy. No biliary dilatation. Pancreas: Unremarkable. No pancreatic ductal dilatation or surrounding inflammatory changes. Spleen: Normal in size without focal abnormality. Adrenals/Urinary Tract: Adrenal glands are unremarkable. Kidneys are normal, without renal calculi, focal lesion, or hydronephrosis. Bladder is unremarkable. Stomach/Bowel: Mild wall thickening adjacent inflammatory change of the transverse colon, likely reactive. Prior right partial hemicolectomy. No evidence of obstruction. Vascular/Lymphatic: Aortic atherosclerosis. No enlarged abdominal or pelvic lymph nodes. Reproductive: Uterus and bilateral adnexa are unremarkable. Other: Postsurgical changes of the anterior abdominal wall, including trace fluid and air and surgical drain. No free fluid or free air seen in the abdomen or pelvis. Musculoskeletal: Prior posterior fusion of the lumbar spine. No aggressive appearing osseous lesions. IMPRESSION: 1. Expected postsurgical changes of ventral abdominal hernia repair. 2. Mild wall thickening and adjacent inflammatory change of the transverse colon, likely reactive. 3. Aortic Atherosclerosis (ICD10-I70.0). Electronically Signed   By: LYetta GlassmanM.D.   On: 06/02/2022 12:27    EKG: not done   Labs on Admission: I have personally reviewed the available labs and imaging studies at the time of the admission.  Pertinent labs:    Glucose 208 BUN 13/Creatinine 1.29/GFR 44 - stable Albumin 3.2 Lactate 1.3 WBC 18.1 Blood cultures pending COVID negative UA: >500 glucose, 20 ketones, moderate LE, rare bacteria   Assessment and  Plan: Active Problems:   SIRS (systemic inflammatory response syndrome) (HCC)   Type II diabetes mellitus (HCC)   Hypothyroidism   Hypertension   Atypical chest pain   Diastolic dysfunction   Hyperlipidemia   Chronic kidney disease, stage 3a (HCC)   Mood disorder (HCC)    SIRS -Patient with recent intra-abdominal surgery presenting with acute onset of reported fever (none here), tachycardia, leukocytosis -CT is reassuring for source of infection, including from recent surgery -Will observe on telemetry at least overnight -Will continue Zosyn, Vanc for now -Blood cultures are pending -UA shows dehydration but UTI is less likely; urine culture is pending  CAD -Resume ASA  DM -Recent A1c was 9.8, indicating poor control and therefore higher risk for poor wound healing -Continue glargine -Cover with moderate-scale SSI  -Continue Neurontin  Chronic diastolic CHF -Grade 1 diastolic dysfunction with preserved EF in 2019 -Appears to be compensated/dry at this time -Monitor for evidence of volume overload  HTN -Continue amlodipine, Toprol XL -Hold irbesartan-HCTZ  Stage 3a CKD -Appears to be stable at this time -Attempt to avoid nephrotoxic medications -Will hold ACE/HCTZ given dehydration as evidenced by ketonuria -Recheck BMP in AM   HLD -Continue rosuvastatin  Hypothyroidism -Continue Synthroid  Mood  d/o -Continue escitalopram, imipramine, Ativan, temazepam    Advance Care Planning:   Code Status: Full Code   Consults: Surgery  DVT Prophylaxis: SCDs  Family Communication: Son was present throughout evaluation  Severity of Illness: The appropriate patient status for this patient is OBSERVATION. Observation status is judged to be reasonable and necessary in order to provide the required intensity of service to ensure the patient's safety. The patient's presenting symptoms, physical exam findings, and initial radiographic and laboratory data in the context of  their medical condition is felt to place them at decreased risk for further clinical deterioration. Furthermore, it is anticipated that the patient will be medically stable for discharge from the hospital within 2 midnights of admission.   Author: Karmen Bongo, MD 06/02/2022 6:14 PM  For on call review [email or web address].

## 2022-06-02 NOTE — ED Notes (Signed)
Pt on 2L  after fentanyl admin

## 2022-06-02 NOTE — ED Notes (Signed)
Pt assisted to bedside commode by this RN  

## 2022-06-02 NOTE — ED Notes (Signed)
Pt to CT

## 2022-06-02 NOTE — ED Triage Notes (Signed)
Pt. Stated, ive had bad lower abdominal pain with nausea, my body hurts all over. I had hernia operation 2 weeks ago. This started yesterday

## 2022-06-02 NOTE — ED Notes (Signed)
Pt placed on 3L [place] for desats to 70s after given fentanyl

## 2022-06-02 NOTE — ED Provider Notes (Signed)
[facility] EMERGENCY DEPARTMENT Provider Note   CSN: [id number] Arrival date & time: 06/02/22  9935     History  Chief Complaint  Patient presents with   Abdominal Pain   Generalized Body Aches   Nausea    [name] is a 71 y.o. female.   Abdominal Pain Patient is a 70 year old female with a past medical history significant for hypertension and hypothyroidism.,  Fibromyalgia, DVT, diverticulitis, diabetes.  She is status post cholecystectomy, appendectomy, hernia repair and partial colectomy 2017.  Her most recent abdominal surgery was approximately 2 weeks ago 05/18/22  Pt complains of generalized abdominal pain that began yesterday and is significantly worsened since.  She states she feels lightheaded fatigued.  She had a hernia surgery 2 weeks ago and still has a drain in her right lower abdomen.   States that she has had fevers at home.  Her last bowel movement was yesterday she states that it was diarrhea.  She denies any chest pain cough or congestion.     Home Medications Prior to Admission medications   Medication Sig Start Date End Date Taking? Authorizing Provider  acetaminophen (TYLENOL) 500 MG tablet Take 1,000 mg by mouth every 8 (eight) hours as needed for mild pain or headache.    [provider]  amLODipine (NORVASC) 2.5 MG tablet Take 2 tablets (5 mg total) by mouth daily. Patient taking differently: Take 2.5 mg by mouth in the morning. 05/25/21   Sueanne Margarita, MD  aspirin EC 81 MG tablet Take 81 mg by mouth daily.    [provider]  baclofen (LIORESAL) 10 MG tablet Take 1 tablet (10 mg total) by mouth 2 (two) times daily as needed for muscle spasms. 05/20/22 05/20/23  Cornett, Marcello Moores, MD  cephALEXin (KEFLEX) 500 MG capsule Take 1 capsule (500 mg total) by mouth 4 (four) times daily. Patient not taking: Reported on 05/04/2022 01/28/22   Azucena Cecil, PA-C  cetirizine (ZYRTEC) 10 MG tablet Take 10 mg by mouth  at bedtime.    [provider]  Cholecalciferol (VITAMIN D3) 125 MCG (5000 UT) TABS Take 5,000 Units by mouth in the morning.    [provider]  Clobetasol Propionate (TEMOVATE) 0.05 % external spray Apply 1 spray topically at bedtime as needed (scalp eczema).    [provider]  cyclobenzaprine (FLEXERIL) 10 MG tablet Take 1 tablet (10 mg total) by mouth 3 (three) times daily as needed for muscle spasms. Patient not taking: Reported on 05/04/2022 04/19/19   Viona Gilmore D, NP  escitalopram (LEXAPRO) 5 MG tablet Take 5 mg by mouth daily. 03/24/22   [provider]  fluticasone (FLONASE) 50 MCG/ACT nasal spray Place 2 sprays into both nostrils daily as needed for allergies or rhinitis.    [provider]  furosemide (LASIX) 20 MG tablet TAKE 1 TABLET (20 MG TOTAL) BY MOUTH DAILY AS NEEDED FOR FLUID OR EDEMA. 09/21/21   Sueanne Margarita, MD  gabapentin (NEURONTIN) 300 MG capsule Take 900 mg by mouth at bedtime.    [provider]  Glucagon (GVOKE HYPOPEN 1-PACK) 1 MG/0.2ML SOAJ Inject 1 mg into the skin as needed (hypoglycemia).    [provider]  HUMALOG 100 UNIT/ML injection Inject 15-20 Units into the skin with breakfast, with lunch, and with evening meal. 02/03/22   [provider]  ibandronate (BONIVA) 150 MG tablet Take 150 mg by mouth every 30 (thirty) days. 03/06/19   [provider]  imipramine (TOFRANIL) 50 MG tablet Take 1 tablet (50 mg total) by mouth daily. Patient taking differently: Take 50 mg by mouth at bedtime. 01/11/22   Lomax, Amy, NP  insulin glargine (LANTUS) 100 UNIT/ML injection Inject 44 Units into the skin in the morning.    [provider]  irbesartan-hydrochlorothiazide (AVALIDE) 300-12.5 MG tablet Take 1 tablet by mouth in the morning.    [provider]  levothyroxine (SYNTHROID) 175 MCG tablet Take 175 mcg by mouth daily before breakfast. 10/10/17   [provider]   LORazepam (ATIVAN) 0.5 MG tablet Take 0.5-1 mg by mouth every 8 (eight) hours as needed for anxiety. 03/15/19   [provider]  Magnesium 250 MG TABS Take 250 mg by mouth in the morning.    [provider]  meclizine (ANTIVERT) 12.5 MG tablet Take 25 mg by mouth 3 (three) times daily as needed for dizziness.    [provider]  metoprolol succinate (TOPROL-XL) 25 MG 24 hr tablet Take 1 tablet (25 mg total) by mouth daily. 05/25/21   Sueanne Margarita, MD  ondansetron (ZOFRAN ODT) 4 MG disintegrating tablet Take 1 tablet (4 mg total) by mouth every 8 (eight) hours as needed for nausea or vomiting. 12/02/19   Deno Etienne, DO  oxyCODONE (ROXICODONE) 5 MG immediate release tablet Take 1 tablet (5 mg total) by mouth every 4 (four) hours as needed for severe pain. 05/20/22   Cornett, Marcello Moores, MD  pantoprazole (PROTONIX) 40 MG tablet Take 40 mg by mouth daily before breakfast. 07/03/19   [provider]  Potassium 99 MG TABS Take 99 mg by mouth in the morning.    [provider]  Probiotic Product (PROBIOTIC PO) Take 1 capsule by mouth in the morning.    [provider]  promethazine (PHENERGAN) 25 MG tablet Take 25 mg by mouth every 8 (eight) hours as needed for nausea or vomiting.    [provider]  rosuvastatin (CRESTOR) 20 MG tablet Take 20 mg by mouth in the morning.    [provider]  temazepam (RESTORIL) 15 MG capsule Take 15 mg by mouth at bedtime.  10/10/17   [provider]  tiZANidine (ZANAFLEX) 4 MG tablet Take 4 mg by mouth every 6 (six) hours as needed for muscle spasms. 04/19/22   [provider]  vitamin B-12 (CYANOCOBALAMIN) 1000 MCG tablet Take 1,000 mcg by mouth in the morning.    [provider]  zinc sulfate 220 (50 Zn) MG capsule Take 220 mg by mouth in the morning.    [provider]      Allergies    Dilaudid [hydromorphone hcl], Heparin, Ambien [zolpidem tartrate], Belsomra  [suvorexant], Codeine, Morphine and related, Penicillins, Statins, Ceftin [cefuroxime axetil], Elavil [amitriptyline], Lovaza [omega-3-acid ethyl esters], Lunesta [eszopiclone], Lyrica [pregabalin], and Metformin and related    Review of Systems   Review of Systems  Gastrointestinal:  Positive for abdominal pain.    Physical Exam Updated Vital Signs BP (!) 125/101   Pulse (!) 104   Temp 99.5 F (37.5 C) (Oral)   Resp (!) 21   Ht '5\' 3"'$  (1.6 m)   Wt 90.7 kg   SpO2 95%   BMI 35.43 kg/m  Physical Exam Vitals and nursing note reviewed.  Constitutional:      General: She is not in acute distress. HENT:     Head: Normocephalic and atraumatic.     Nose: Nose normal.  Eyes:     General: No  scleral icterus. Cardiovascular:     Rate and Rhythm: Normal rate and regular rhythm.     Pulses: Normal pulses.     Heart sounds: Normal heart sounds.  Pulmonary:     Effort: Pulmonary effort is normal. No respiratory distress.     Breath sounds: No wheezing.  Abdominal:     Palpations: Abdomen is soft.     Tenderness: There is generalized abdominal tenderness and tenderness in the right upper quadrant, epigastric area and left upper quadrant.  Musculoskeletal:     Cervical back: Normal range of motion.     Right lower leg: No edema.     Left lower leg: No edema.  Skin:    General: Skin is warm and dry.     Capillary Refill: Capillary refill takes less than 2 seconds.  Neurological:     Mental Status: She is alert. Mental status is at baseline.  Psychiatric:        Mood and Affect: Mood normal.        Behavior: Behavior normal.     ED Results / Procedures / Treatments   Labs (all labs ordered are listed, but only abnormal results are displayed) Labs Reviewed  COMPREHENSIVE METABOLIC PANEL - Abnormal; Notable for the following components:      Result Value   Sodium 134 (*)    Glucose, Bld 208 (*)    Creatinine, Ser 1.29 (*)    Albumin 3.2 (*)    AST 11 (*)    Total Bilirubin  1.3 (*)    GFR, Estimated 44 (*)    All other components within normal limits  CBC - Abnormal; Notable for the following components:   WBC 18.1 (*)    All other components within normal limits  CULTURE, BLOOD (SINGLE)  URINE CULTURE  LACTIC ACID, PLASMA  LIPASE, BLOOD  URINALYSIS, ROUTINE W REFLEX MICROSCOPIC    EKG None  Radiology DG Chest 2 View  Result Date: 06/02/2022 CLINICAL DATA:  Postop fever EXAM: CHEST - 2 VIEW COMPARISON:  11/21/2021 FINDINGS: Heart and mediastinal contours are within normal limits. No focal opacities or effusions. No acute bony abnormality. IMPRESSION: No active cardiopulmonary disease. Electronically Signed   By: Rolm Baptise M.D.   On: 06/02/2022 13:38   CT ABDOMEN PELVIS W CONTRAST  Result Date: 06/02/2022 CLINICAL DATA:  Abdominal pain, recent hernia surgery EXAM: CT ABDOMEN AND PELVIS WITH CONTRAST TECHNIQUE: Multidetector CT imaging of the abdomen and pelvis was performed using the standard protocol following bolus administration of intravenous contrast. RADIATION DOSE REDUCTION: This exam was performed according to the departmental dose-optimization program which includes automated exposure control, adjustment of the mA and/or kV according to patient size and/or use of iterative reconstruction technique. CONTRAST:  75m OMNIPAQUE IOHEXOL 350 MG/ML SOLN COMPARISON:  CT abdomen and pelvis dated January 28, 2022 FINDINGS: Lower chest: Small hiatal hernia. Mild bibasilar opacities, likely due to scarring or atelectasis. Coronary artery and mitral annular calcifications. Hepatobiliary: No focal liver abnormality is seen. Status post cholecystectomy. No biliary dilatation. Pancreas: Unremarkable. No pancreatic ductal dilatation or surrounding inflammatory changes. Spleen: Normal in size without focal abnormality. Adrenals/Urinary Tract: Adrenal glands are unremarkable. Kidneys are normal, without renal calculi, focal lesion, or hydronephrosis. Bladder is  unremarkable. Stomach/Bowel: Mild wall thickening adjacent inflammatory change of the transverse colon, likely reactive. Prior right partial hemicolectomy. No evidence of obstruction. Vascular/Lymphatic: Aortic atherosclerosis. No enlarged abdominal or pelvic lymph nodes. Reproductive: Uterus and bilateral adnexa are unremarkable. Other: Postsurgical changes of the  anterior abdominal wall, including trace fluid and air and surgical drain. No free fluid or free air seen in the abdomen or pelvis. Musculoskeletal: Prior posterior fusion of the lumbar spine. No aggressive appearing osseous lesions. IMPRESSION: 1. Expected postsurgical changes of ventral abdominal hernia repair. 2. Mild wall thickening and adjacent inflammatory change of the transverse colon, likely reactive. 3. Aortic Atherosclerosis (ICD10-I70.0). Electronically Signed   By: Yetta Glassman M.D.   On: 06/02/2022 12:27    Procedures .Critical Care  Performed by: Tedd Sias, PA Authorized by: Tedd Sias, PA   Critical care provider statement:    Critical care time (minutes):  35   Critical care time was exclusive of:  Separately billable procedures and treating other patients and teaching time   Critical care was necessary to treat or prevent imminent or life-threatening deterioration of the following conditions:  Sepsis   Critical care was time spent personally by me on the following activities:  Development of treatment plan with patient or surrogate, review of old charts, re-evaluation of patient's condition, pulse oximetry, ordering and review of radiographic studies, ordering and review of laboratory studies, ordering and performing treatments and interventions, obtaining history from patient or surrogate, examination of patient and evaluation of patient's response to treatment   Care discussed with: admitting provider       Medications Ordered in ED Medications  fentaNYL (SUBLIMAZE) injection 50 mcg (has no  administration in time range)  lactated ringers infusion (has no administration in time range)  lactated ringers bolus 1,000 mL (has no administration in time range)  iohexol (OMNIPAQUE) 350 MG/ML injection 50 mL (50 mLs Intravenous Contrast Given 06/02/22 1152)  ondansetron (ZOFRAN) injection 4 mg (4 mg Intravenous Given 06/02/22 1220)  lactated ringers bolus 1,000 mL (1,000 mLs Intravenous New Bag/Given 06/02/22 1257)  fentaNYL (SUBLIMAZE) injection 100 mcg (100 mcg Intravenous Given 06/02/22 1257)  acetaminophen (TYLENOL) tablet 1,000 mg (1,000 mg Oral Given 06/02/22 1405)    ED Course/ Medical Decision Making/ A&P Clinical Course as of 06/02/22 1428  Thu Jun 02, 2022  1242 IMPRESSION: 1. Expected postsurgical changes of ventral abdominal hernia repair. 2. Mild wall thickening and adjacent inflammatory change of the transverse colon, likely reactive. 3. Aortic Atherosclerosis (ICD10-I70.0).   [WF]  8099 WBC(!): 18.1 [WF]    Clinical Course User Index [WF] Tedd Sias, PA                           Medical Decision Making Amount and/or Complexity of Data Reviewed Labs: ordered. Decision-making details documented in ED Course. Radiology: ordered.  Risk OTC drugs. Prescription drug management.   This patient presents to the ED for concern of abdominal pain, this involves a number of treatment options, and is a complaint that carries with it a high risk of complications and morbidity. A differential diagnosis was considered for the patient's symptoms which is discussed below:   The causes of generalized abdominal pain include but are not limited to AAA, mesenteric ischemia, appendicitis, diverticulitis, DKA, gastritis, gastroenteritis, AMI, nephrolithiasis, pancreatitis, peritonitis, adrenal insufficiency,lead poisoning, iron toxicity, intestinal ischemia, constipation, UTI,SBO/LBO, splenic rupture, biliary disease, IBD, IBS, PUD, or hepatitis.   Co morbidities: Discussed  in HPI   Brief History:  Patient is a 71 year old female with a past medical history significant for hypertension and hypothyroidism.,  Fibromyalgia, DVT, diverticulitis, diabetes.  She is status post cholecystectomy, appendectomy, hernia repair and partial colectomy 2017.  Her most recent abdominal  surgery was approximately 2 weeks ago 05/18/22  Pt complains of generalized abdominal pain that began yesterday and is significantly worsened since.  She states she feels lightheaded fatigued.  She had a hernia surgery 2 weeks ago and still has a drain in her right lower abdomen.   States that she has had fevers at home.  Her last bowel movement was yesterday she states that it was diarrhea.  She denies any chest pain cough or congestion.  She denies any urinary frequency urgency dysuria or hematuria.   EMR reviewed including pt PMHx, past surgical history and past visits to ER.   See HPI for more details   Lab Tests:   I ordered and independently interpreted labs. Labs notable for leukocytosis, CMP with kidney function at baseline.  Lactic within normal limits which decreases my concern for mesenteric ischemia, lipase within normal limits doubt pancreatitis.   Imaging Studies:  Abnormal findings. I personally reviewed all imaging studies. Imaging notable for  IMPRESSION:  1. Expected postsurgical changes of ventral abdominal hernia repair.  2. Mild wall thickening and adjacent inflammatory change of the  transverse colon, likely reactive.  3. Aortic Atherosclerosis (ICD10-I70.0).      Electronically Signed    By: Yetta Glassman M.D.    On: 06/02/2022 12:27    Cardiac Monitoring:  The patient was maintained on a cardiac monitor.  I personally viewed and interpreted the cardiac monitored which showed an underlying rhythm of: sinus tachycardia NA   Medicines ordered:  I ordered medication including vancomycin, Zosyn, lactated Ringer's, fentanyl, Zofran, Tylenol for pain,  fever, broad-spectrum antibiotics Reevaluation of the patient after these medicines showed that the patient improved I have reviewed the patients home medicines and have made adjustments as needed   Critical Interventions:     Consults/Attending Physician   I discussed this case with my attending physician who cosigned this note including patient's presenting symptoms, physical exam, and planned diagnostics and interventions. Attending physician stated agreement with plan or made changes to plan which were implemented.  Discussed case with tech who is with Dr. Brantley Stage however he is currently in surgery  Secure message sent to general surgery PA to evaluate.  Reevaluation:  After the interventions noted above I re-evaluated patient and found that they have :improved   Social Determinants of Health:      Problem List / ED Course:  Sepsis -perhaps secondary to intra-abdominal infection perhaps colitis/UTI/early infection. Wound looks clean.    Dispostion:  After consideration of the diagnostic results and the patients response to treatment, I feel that the patent would benefit from admission to the hospital for IV abx and surgery consultation and observation.    Final Clinical Impression(s) / ED Diagnoses Final diagnoses:  Colitis  Sepsis without acute organ dysfunction, due to unspecified organism [facility])    Rx / DC Orders ED Discharge Orders     None         Tedd Sias, [place] 06/02/22 1428    Pattricia Boss, MD 06/03/22 1145

## 2022-06-03 DIAGNOSIS — Z79899 Other long term (current) drug therapy: Secondary | ICD-10-CM | POA: Diagnosis not present

## 2022-06-03 DIAGNOSIS — G2581 Restless legs syndrome: Secondary | ICD-10-CM | POA: Diagnosis present

## 2022-06-03 DIAGNOSIS — Z1152 Encounter for screening for COVID-19: Secondary | ICD-10-CM | POA: Diagnosis not present

## 2022-06-03 DIAGNOSIS — Z8249 Family history of ischemic heart disease and other diseases of the circulatory system: Secondary | ICD-10-CM | POA: Diagnosis not present

## 2022-06-03 DIAGNOSIS — M797 Fibromyalgia: Secondary | ICD-10-CM | POA: Diagnosis present

## 2022-06-03 DIAGNOSIS — T8140XA Infection following a procedure, unspecified, initial encounter: Secondary | ICD-10-CM | POA: Diagnosis present

## 2022-06-03 DIAGNOSIS — Z7983 Long term (current) use of bisphosphonates: Secondary | ICD-10-CM | POA: Diagnosis not present

## 2022-06-03 DIAGNOSIS — N1831 Chronic kidney disease, stage 3a: Secondary | ICD-10-CM | POA: Diagnosis present

## 2022-06-03 DIAGNOSIS — F39 Unspecified mood [affective] disorder: Secondary | ICD-10-CM | POA: Diagnosis present

## 2022-06-03 DIAGNOSIS — Z9049 Acquired absence of other specified parts of digestive tract: Secondary | ICD-10-CM | POA: Diagnosis not present

## 2022-06-03 DIAGNOSIS — A419 Sepsis, unspecified organism: Secondary | ICD-10-CM

## 2022-06-03 DIAGNOSIS — Z7982 Long term (current) use of aspirin: Secondary | ICD-10-CM | POA: Diagnosis not present

## 2022-06-03 DIAGNOSIS — R6510 Systemic inflammatory response syndrome (SIRS) of non-infectious origin without acute organ dysfunction: Secondary | ICD-10-CM | POA: Diagnosis present

## 2022-06-03 DIAGNOSIS — R824 Acetonuria: Secondary | ICD-10-CM | POA: Diagnosis present

## 2022-06-03 DIAGNOSIS — E039 Hypothyroidism, unspecified: Secondary | ICD-10-CM | POA: Diagnosis present

## 2022-06-03 DIAGNOSIS — K529 Noninfective gastroenteritis and colitis, unspecified: Secondary | ICD-10-CM | POA: Diagnosis present

## 2022-06-03 DIAGNOSIS — E785 Hyperlipidemia, unspecified: Secondary | ICD-10-CM | POA: Diagnosis present

## 2022-06-03 DIAGNOSIS — I2510 Atherosclerotic heart disease of native coronary artery without angina pectoris: Secondary | ICD-10-CM | POA: Diagnosis present

## 2022-06-03 DIAGNOSIS — I959 Hypotension, unspecified: Secondary | ICD-10-CM | POA: Diagnosis not present

## 2022-06-03 DIAGNOSIS — Z794 Long term (current) use of insulin: Secondary | ICD-10-CM | POA: Diagnosis not present

## 2022-06-03 DIAGNOSIS — Z79890 Hormone replacement therapy (postmenopausal): Secondary | ICD-10-CM | POA: Diagnosis not present

## 2022-06-03 DIAGNOSIS — I5189 Other ill-defined heart diseases: Secondary | ICD-10-CM | POA: Diagnosis not present

## 2022-06-03 DIAGNOSIS — I5032 Chronic diastolic (congestive) heart failure: Secondary | ICD-10-CM | POA: Diagnosis present

## 2022-06-03 DIAGNOSIS — E1122 Type 2 diabetes mellitus with diabetic chronic kidney disease: Secondary | ICD-10-CM | POA: Diagnosis present

## 2022-06-03 DIAGNOSIS — G8918 Other acute postprocedural pain: Secondary | ICD-10-CM | POA: Diagnosis present

## 2022-06-03 DIAGNOSIS — I130 Hypertensive heart and chronic kidney disease with heart failure and stage 1 through stage 4 chronic kidney disease, or unspecified chronic kidney disease: Secondary | ICD-10-CM | POA: Diagnosis present

## 2022-06-03 DIAGNOSIS — E860 Dehydration: Secondary | ICD-10-CM | POA: Diagnosis present

## 2022-06-03 LAB — CBC
HCT: 33.2 % — ABNORMAL LOW (ref 36.0–46.0)
Hemoglobin: 10.9 g/dL — ABNORMAL LOW (ref 12.0–15.0)
MCH: 27.7 pg (ref 26.0–34.0)
MCHC: 32.8 g/dL (ref 30.0–36.0)
MCV: 84.5 fL (ref 80.0–100.0)
Platelets: 203 10*3/uL (ref 150–400)
RBC: 3.93 MIL/uL (ref 3.87–5.11)
RDW: 13.2 % (ref 11.5–15.5)
WBC: 9.1 10*3/uL (ref 4.0–10.5)
nRBC: 0 % (ref 0.0–0.2)

## 2022-06-03 LAB — GLUCOSE, CAPILLARY
Glucose-Capillary: 136 mg/dL — ABNORMAL HIGH (ref 70–99)
Glucose-Capillary: 179 mg/dL — ABNORMAL HIGH (ref 70–99)
Glucose-Capillary: 326 mg/dL — ABNORMAL HIGH (ref 70–99)
Glucose-Capillary: 229 mg/dL — ABNORMAL HIGH (ref 70–99)

## 2022-06-03 LAB — BASIC METABOLIC PANEL
Anion gap: 7 (ref 5–15)
BUN: 12 mg/dL (ref 8–23)
CO2: 29 mmol/L (ref 22–32)
Calcium: 8.9 mg/dL (ref 8.9–10.3)
Chloride: 103 mmol/L (ref 98–111)
Creatinine, Ser: 1.22 mg/dL — ABNORMAL HIGH (ref 0.44–1.00)
GFR, Estimated: 47 mL/min — ABNORMAL LOW (ref >60)
Glucose, Bld: 100 mg/dL — ABNORMAL HIGH (ref 70–99)
Potassium: 3.7 mmol/L (ref 3.5–5.1)
Sodium: 139 mmol/L (ref 135–145)

## 2022-06-03 MED ORDER — LIVING WELL WITH DIABETES BOOK
Freq: Once | Status: AC
  Filled 2022-06-03: qty 1

## 2022-06-03 MED ORDER — VANCOMYCIN HCL 750 MG/150ML IV SOLN
750.0000 mg | INTRAVENOUS | Status: DC
  Administered 2022-06-03 – 2022-06-05 (×3): 750 mg via INTRAVENOUS
  Filled 2022-06-03 (×2): qty 150

## 2022-06-03 NOTE — Progress Notes (Signed)
[facility] Progress Note     Subjective: Having stable abdominal pain not worse from yesterday and not worse with clear liquids. No nausea. Passing flatus  Objective: Vital signs in last 24 hours: Temp:  [97.6 F (36.4 C)-99.5 F (37.5 C)] 97.9 F (36.6 C) (10/27 0735) Pulse Rate:  [73-107] 73 (10/27 0735) Resp:  [13-24] 18 (10/27 0735) BP: (85-160)/(39-101) 85/39 (10/27 0735) SpO2:  [93 %-100 %] 96 % (10/27 0735) Weight:  [98 kg] 98 kg (10/26 2046)    Intake/Output from previous day: 10/26 0701 - 10/27 0700 In: 1490.2 [I.V.:431.8; IV Piggyback:1058.4] Out: -  Intake/Output this shift: Total I/O In: 240 [P.O.:240] Out: -   PE: Gen:  Alert, NAD, pleasant HEENT: EOM's intact, pupils equal and round Abd: Soft, ND, +BS, no hernia, midline incision with staples intact and some mild local erythema not concerning for infection. Drain with minimal sanguinous drainage. Mild diffuse TPP without rebound/guarding - stable from yesterday Ext:  no BUE/BLE edema, calves soft and nontender Psych: A&Ox4  Skin: no rashes noted, warm and dry  Lab Results:  Recent Labs    06/02/22 1011 06/03/22 0444  WBC 18.1* 9.1  HGB 13.0 10.9*  HCT 39.3 33.2*  PLT 269 203    BMET Recent Labs    06/02/22 1011 06/03/22 0444  NA 134* 139  K 4.1 3.7  CL 98 103  CO2 25 29  GLUCOSE 208* 100*  BUN 13 12  CREATININE 1.29* 1.22*  CALCIUM 9.1 8.9    PT/INR No results for input(s): "LABPROT", "INR" in the last 72 hours. CMP     Component Value Date/Time   NA 139 06/03/2022 0444   NA 133 (L) 02/18/2020 1119   K 3.7 06/03/2022 0444   CL 103 06/03/2022 0444   CO2 29 06/03/2022 0444   GLUCOSE 100 (H) 06/03/2022 0444   BUN 12 06/03/2022 0444   BUN 14 02/18/2020 1119   CREATININE 1.22 (H) 06/03/2022 0444   CALCIUM 8.9 06/03/2022 0444   PROT 6.6 06/02/2022 1011   PROT 6.5 07/10/2019 1442   ALBUMIN 3.2 (L) 06/02/2022 1011   ALBUMIN 4.1 07/10/2019 1442   AST 11 (L)  06/02/2022 1011   ALT 11 06/02/2022 1011   ALKPHOS 76 06/02/2022 1011   BILITOT 1.3 (H) 06/02/2022 1011   BILITOT 0.4 07/10/2019 1442   GFRNONAA 47 (L) 06/03/2022 0444   GFRAA 73 02/18/2020 1119   Lipase     Component Value Date/Time   LIPASE 23 06/02/2022 1011       Studies/Results: DG Chest 2 View  Result Date: 06/02/2022 CLINICAL DATA:  Postop fever EXAM: CHEST - 2 VIEW COMPARISON:  11/21/2021 FINDINGS: Heart and mediastinal contours are within normal limits. No focal opacities or effusions. No acute bony abnormality. IMPRESSION: No active cardiopulmonary disease. Electronically Signed   By: Rolm Baptise M.D.   On: 06/02/2022 13:38   CT ABDOMEN PELVIS W CONTRAST  Result Date: 06/02/2022 CLINICAL DATA:  Abdominal pain, recent hernia surgery EXAM: CT ABDOMEN AND PELVIS WITH CONTRAST TECHNIQUE: Multidetector CT imaging of the abdomen and pelvis was performed using the standard protocol following bolus administration of intravenous contrast. RADIATION DOSE REDUCTION: This exam was performed according to the departmental dose-optimization program which includes automated exposure control, adjustment of the mA and/or kV according to patient size and/or use of iterative reconstruction technique. CONTRAST:  110m OMNIPAQUE IOHEXOL 350 MG/ML SOLN COMPARISON:  CT abdomen and pelvis dated January 28, 2022 FINDINGS: Lower chest: Small hiatal  hernia. Mild bibasilar opacities, likely due to scarring or atelectasis. Coronary artery and mitral annular calcifications. Hepatobiliary: No focal liver abnormality is seen. Status post cholecystectomy. No biliary dilatation. Pancreas: Unremarkable. No pancreatic ductal dilatation or surrounding inflammatory changes. Spleen: Normal in size without focal abnormality. Adrenals/Urinary Tract: Adrenal glands are unremarkable. Kidneys are normal, without renal calculi, focal lesion, or hydronephrosis. Bladder is unremarkable. Stomach/Bowel: Mild wall thickening adjacent  inflammatory change of the transverse colon, likely reactive. Prior right partial hemicolectomy. No evidence of obstruction. Vascular/Lymphatic: Aortic atherosclerosis. No enlarged abdominal or pelvic lymph nodes. Reproductive: Uterus and bilateral adnexa are unremarkable. Other: Postsurgical changes of the anterior abdominal wall, including trace fluid and air and surgical drain. No free fluid or free air seen in the abdomen or pelvis. Musculoskeletal: Prior posterior fusion of the lumbar spine. No aggressive appearing osseous lesions. IMPRESSION: 1. Expected postsurgical changes of ventral abdominal hernia repair. 2. Mild wall thickening and adjacent inflammatory change of the transverse colon, likely reactive. 3. Aortic Atherosclerosis (ICD10-I70.0). Electronically Signed   By: Yetta Glassman M.D.   On: 06/02/2022 12:27    Anti-infectives: Anti-infectives (From admission, onward)    Start     Dose/Rate Route Frequency Ordered Stop   06/03/22 1500  vancomycin (VANCOREADY) IVPB 750 mg/150 mL        750 mg 150 mL/hr over 60 Minutes Intravenous Every 24 hours 06/02/22 1445     06/02/22 1445  vancomycin (VANCOREADY) IVPB 2000 mg/400 mL        2,000 mg 200 mL/hr over 120 Minutes Intravenous  Once 06/02/22 1432 06/02/22 1750   06/02/22 1445  piperacillin-tazobactam (ZOSYN) IVPB 3.375 g        3.375 g 12.5 mL/hr over 240 Minutes Intravenous Every 8 hours 06/02/22 1432          Assessment/Plan POD# 15 S/p Open repair of reducible nonobstructed incisional hernia with mesh in myofascial advancement flaps measuring 10 cm x 10 cm on 05/18/22 by Dr. Brantley Stage  - Readmit 10/26 with diffuse body aches, fatigue, abdominal pain, nausea CT scan fairly reassuring with expected postoperative changes and no intraabdominal fluid collections.  - Continue JP drain - SS today - advance to full liquids - dc staples and place steri strips  ID - zosyn, vancomycin FEN - IVF, fulls VTE - SCDs, per primary/ ok for  chemical dvt ppx from surgical standpoint Foley - none   LOS: 0 days    [name], [facility] 06/03/2022, 11:05 AM Please see Amion for pager number during day hours 7:00am-4:30pm

## 2022-06-03 NOTE — Inpatient Diabetes Management (Addendum)
Inpatient Diabetes Program Recommendations  AACE/ADA: New Consensus Statement on Inpatient Glycemic Control (2015)  Target Ranges:  Prepandial:   less than 140 mg/dL      Peak postprandial:   less than 180 mg/dL (1-2 hours)      Critically ill patients:  140 - 180 mg/dL   Lab Results  Component Value Date   GLUCAP 136 (H) 06/03/2022   HGBA1C 9.8 (H) 03/07/2022    Review of Glycemic Control  Latest Reference Range & Units 06/02/22 16:11 06/02/22 21:08 06/03/22 07:31  Glucose-Capillary 70 - 99 mg/dL 311 (H) 203 (H) 136 (H)  (H): Data is abnormally high  Diabetes history: DM2 Outpatient Diabetes medications: Lantus 44 units QD, Humalog 15 units TID Current orders for Inpatient glycemic control: Semglee 44 units QD, Novolog 0-15 units & 0-5 units  Inpatient Diabetes Program Recommendations:    Please decrease Semglee to 22 units QD (50% of home dose).  Addendum'@1240'$ :  Spoke with patient at bedside.  Reviewed patient's current A1c of 9.8% (average BG of 235 mg/dL).  Explained what a A1c is and what it measures. Also reviewed goal A1c with patient, importance of good glucose control @ home, and blood sugar goals.  She states her last A1C was around 8% when she last saw Eilene Ghazi, [place] with endocrine Dr. Chalmers Cater.    She denies drinking caloric beverages.  Discussed The Plate Method, CHO's, portion control, CBGs at home fasting and mid afternoon, F/U with PCP every 3 months, bring meter to PCP office, long and short term complications of uncontrolled BG, and importance of exercise once cleared.  She wears the [facility] 2 which is currently on her left arm.  She is complaining of frequent finger sticks.  If MD placed an order for CGM use while inpatient the staff could use her CGM as she came in with one on.    She denies difficulties obtaining insulins or DM supplies.  Encouraged her to try to get her CBG's down closer to 150 mg/dL.    Will continue to follow while  inpatient.  Thank you, Reche Dixon, MSN, [place] Diabetes Coordinator Inpatient Diabetes Program [phone] (team pager from 8a-5p)

## 2022-06-03 NOTE — Plan of Care (Signed)

## 2022-06-03 NOTE — Progress Notes (Addendum)
[facility]  PROGRESS NOTE  DELMI FULFER TWS:[id number] DOB: [birth year] DOA: 06/02/2022 PCP: Harlan Stains, MD   Brief HPI:   71 year old female with medical history of CAD, CVA, diabetes mellitus type 2, chronic diastolic heart failure, hypertension, hyperlipidemia, hypothyroidism, RLS came with abdominal pain patient recently had incisional hernia repair 2 weeks ago.  Patient said that she had developed fever abdominal pain started yesterday.  Abdominal binder is in place.  She has a right lower quadrant drain with serosanguineous discharge which is unchanged from previous hospitalization.  General surgery was consulted, CT scan abdomen showed only mild colitis.  No other abnormality noted.    Subjective   Patient still has abdominal pain.   Assessment/Plan:    Abdominal pain/SIRS -Presented with tachycardia, leukocytosis, abdominal pain, lactic acid 1.3 -CT abdomen/pelvis was reassuring -Patient started on vancomycin and Zosyn -Blood cultures obtained, result is pending -Follow urine culture results  CAD -Stable -Continue aspirin, rosuvastatin  Diabetes mellitus type 2 -Recent HbA1c was 9.8 -Continue Lantus 44 units subcu daily -Continue sliding scale insulin with NovoLog -CBG well controlled  Chronic diastolic heart failure -Patient has grade 1 diastolic dysfunction with preserved EF and 2019 -Appears to be compensated  Hypertension -Continue Toprol XL -Irbesartan HCTZ on hold -We will hold amlodipine  CKD stage IIIa -Creatinine stable -HCTZ ACE inhibitor on hold due to hypotension  Hypothyroidism -Continue Synthroid  Mood disorder -Continue escitalopram, imipramine,  temazepam  Medications     aspirin EC  81 mg Oral Daily   escitalopram  5 mg Oral Daily   gabapentin  600 mg Oral QHS   imipramine  50 mg Oral QHS   insulin aspart  0-15 Units Subcutaneous TID WC   insulin aspart  0-5 Units Subcutaneous QHS   insulin glargine-yfgn  44 Units  Subcutaneous Daily   levothyroxine  175 mcg Oral QAC breakfast   loratadine  10 mg Oral Daily   metoprolol succinate  25 mg Oral Daily   pantoprazole  40 mg Oral QAC breakfast   rosuvastatin  20 mg Oral q AM   saccharomyces boulardii  250 mg Oral q AM   temazepam  15 mg Oral QHS     Data Reviewed:   CBG:  Recent Labs  Lab 06/02/22 1611 06/02/22 2108 06/03/22 0731 06/03/22 1221  GLUCAP 311* 203* 136* 179*    SpO2: 96 % O2 Flow Rate (L/min): 2 L/min    Vitals:   06/02/22 2046 06/03/22 0521 06/03/22 0735 06/03/22 1116  BP: 136/64 129/61 (!) 85/39 (!) 109/56  Pulse: 85 85 73 78  Resp: 18 (!) '23 18 18  '$ Temp: 97.7 F (36.5 C)  97.9 F (36.6 C)   TempSrc: Oral  Oral   SpO2: 100% 100% 96%   Weight: 98 kg     Height: '5\' 3"'$  (1.6 m)         Data Reviewed:  Basic Metabolic Panel: Recent Labs  Lab 06/02/22 1011 06/03/22 0444  NA 134* 139  K 4.1 3.7  CL 98 103  CO2 25 29  GLUCOSE 208* 100*  BUN 13 12  CREATININE 1.29* 1.22*  CALCIUM 9.1 8.9    CBC: Recent Labs  Lab 06/02/22 1011 06/03/22 0444  WBC 18.1* 9.1  HGB 13.0 10.9*  HCT 39.3 33.2*  MCV 83.6 84.5  PLT 269 203    LFT Recent Labs  Lab 06/02/22 1011  AST 11*  ALT 11  ALKPHOS 76  BILITOT 1.3*  PROT 6.6  ALBUMIN 3.2*  Antibiotics: Anti-infectives (From admission, onward)    Start     Dose/Rate Route Frequency Ordered Stop   06/03/22 1500  vancomycin (VANCOREADY) IVPB 750 mg/150 mL        750 mg 150 mL/hr over 60 Minutes Intravenous Every 24 hours 06/02/22 1445     06/02/22 1445  vancomycin (VANCOREADY) IVPB 2000 mg/400 mL        2,000 mg 200 mL/hr over 120 Minutes Intravenous  Once 06/02/22 1432 06/02/22 1750   06/02/22 1445  piperacillin-tazobactam (ZOSYN) IVPB 3.375 g        3.375 g 12.5 mL/hr over 240 Minutes Intravenous Every 8 hours 06/02/22 1432          DVT prophylaxis: SCDs  Code Status: Full code  Family Communication: No family at bedside   CONSULTS General  surgery   Objective    Physical Examination:   General-appears in no acute distress Heart-S1-S2, regular, no murmur auscultated Lungs-clear to auscultation bilaterally, no wheezing or crackles auscultated Abdomen-soft, mild tenderness to palpation, abdominal binder in place Extremities-no edema in the lower extremities Neuro-alert, oriented x3, no focal deficit noted  Status is: Inpatient:             Oswald Hillock   [facility] If 7PM-7AM, please contact night-coverage at [email or web address], Office  [phone]   06/03/2022, 2:46 PM  LOS: 0 days

## 2022-06-04 LAB — URINE CULTURE: Culture: <10000 — AB

## 2022-06-04 LAB — GLUCOSE, CAPILLARY
Glucose-Capillary: 419 mg/dL — ABNORMAL HIGH (ref 70–99)
Glucose-Capillary: 404 mg/dL — ABNORMAL HIGH (ref 70–99)
Glucose-Capillary: 323 mg/dL — ABNORMAL HIGH (ref 70–99)
Glucose-Capillary: 231 mg/dL — ABNORMAL HIGH (ref 70–99)

## 2022-06-04 LAB — GLUCOSE, RANDOM: Glucose, Bld: 396 mg/dL — ABNORMAL HIGH (ref 70–99)

## 2022-06-04 MED ORDER — INSULIN GLARGINE-YFGN 100 UNIT/ML ~~LOC~~ SOLN
22.0000 [IU] | Freq: Two times a day (BID) | SUBCUTANEOUS | Status: DC
  Administered 2022-06-04 – 2022-06-06 (×4): 22 [IU] via SUBCUTANEOUS
  Filled 2022-06-04 (×5): qty 0.22

## 2022-06-04 MED ORDER — BISACODYL 10 MG RE SUPP
10.0000 mg | Freq: Once | RECTAL | Status: DC
  Filled 2022-06-04: qty 1

## 2022-06-04 MED ORDER — INSULIN GLARGINE-YFGN 100 UNIT/ML ~~LOC~~ SOLN
22.0000 [IU] | Freq: Every day | SUBCUTANEOUS | Status: DC
  Administered 2022-06-04: 22 [IU] via SUBCUTANEOUS
  Filled 2022-06-04: qty 0.22

## 2022-06-04 NOTE — Progress Notes (Addendum)
[facility]  PROGRESS NOTE  [name]:[id number] DOB: 1951-02-06 DOA: 06/02/2022 PCP: Harlan Stains, MD   Brief HPI:   71 year old female with medical history of CAD, CVA, diabetes mellitus type 2, chronic diastolic heart failure, hypertension, hyperlipidemia, hypothyroidism, RLS came with abdominal pain patient recently had incisional hernia repair 2 weeks ago.  Patient said that she had developed fever abdominal pain started yesterday.  Abdominal binder is in place.  She has a right lower quadrant drain with serosanguineous discharge which is unchanged from previous hospitalization.  General surgery was consulted, CT scan abdomen showed only mild colitis.  No other abnormality noted.    Subjective   Patient seen and examined, complains of nausea this morning   Assessment/Plan:    Abdominal pain/SIRS -Presented with tachycardia, leukocytosis, abdominal pain, lactic acid 1.3 -CT abdomen/pelvis was reassuring -Patient started on vancomycin and Zosyn -Blood cultures obtained, result is pending -Urine culture grew insignificant growth  CAD -Stable -Continue aspirin, rosuvastatin  Diabetes mellitus type 2 -Recent HbA1c was 9.8 -Patient was supposed to get Lantus 44 units subcu yesterday however did not get it -Lantus dose was changed to 22 units subcu this morning -We will increase Lantus to 22 units subcu twice daily -Continue sliding scale insulin with NovoLog -CBG continues to be elevated; Lantus dose changed as above  Chronic diastolic heart failure -Patient has grade 1 diastolic dysfunction with preserved EF and 2019 -Appears to be compensated  Hypertension -Continue Toprol XL -Irbesartan HCTZ on hold -We will continue to hold amlodipine  CKD stage IIIa -Creatinine stable -HCTZ ACE inhibitor on hold due to hypotension  Hypothyroidism -Continue Synthroid  Mood disorder -Continue escitalopram, imipramine,  temazepam  Medications     aspirin EC   81 mg Oral Daily   bisacodyl  10 mg Rectal Once   escitalopram  5 mg Oral Daily   gabapentin  600 mg Oral QHS   imipramine  50 mg Oral QHS   insulin aspart  0-15 Units Subcutaneous TID WC   insulin aspart  0-5 Units Subcutaneous QHS   insulin glargine-yfgn  22 Units Subcutaneous Daily   levothyroxine  175 mcg Oral QAC breakfast   loratadine  10 mg Oral Daily   metoprolol succinate  25 mg Oral Daily   pantoprazole  40 mg Oral QAC breakfast   rosuvastatin  20 mg Oral q AM   saccharomyces boulardii  250 mg Oral q AM   temazepam  15 mg Oral QHS     Data Reviewed:   CBG:  Recent Labs  Lab 06/03/22 1221 06/03/22 1715 06/03/22 2207 06/04/22 0732 06/04/22 1200  GLUCAP 179* 326* 229* 419* 404*    SpO2: 90 % O2 Flow Rate (L/min): 2 L/min    Vitals:   06/03/22 1116 06/03/22 1500 06/03/22 1921 06/04/22 0729  BP: (!) 109/56 136/64 133/67 (!) 157/63  Pulse: 78  (!) 105 99  Resp: '18  16 19  '$ Temp:  97.8 F (36.6 C) 98.4 F (36.9 C) 99 F (37.2 C)  TempSrc:  Oral  Oral  SpO2:   92% 90%  Weight:      Height:          Data Reviewed:  Basic Metabolic Panel: Recent Labs  Lab 06/02/22 1011 06/03/22 0444 06/04/22 1004  NA 134* 139  --   K 4.1 3.7  --   CL 98 103  --   CO2 25 29  --   GLUCOSE 208* 100* 396*  BUN 13 12  --  CREATININE 1.29* 1.22*  --   CALCIUM 9.1 8.9  --     CBC: Recent Labs  Lab 06/02/22 1011 06/03/22 0444  WBC 18.1* 9.1  HGB 13.0 10.9*  HCT 39.3 33.2*  MCV 83.6 84.5  PLT 269 203    LFT Recent Labs  Lab 06/02/22 1011  AST 11*  ALT 11  ALKPHOS 76  BILITOT 1.3*  PROT 6.6  ALBUMIN 3.2*     Antibiotics: Anti-infectives (From admission, onward)    Start     Dose/Rate Route Frequency Ordered Stop   06/03/22 2000  vancomycin (VANCOREADY) IVPB 750 mg/150 mL        750 mg 150 mL/hr over 60 Minutes Intravenous Every 24 hours 06/03/22 1814     06/03/22 1500  vancomycin (VANCOREADY) IVPB 750 mg/150 mL  Status:  Discontinued         750 mg 150 mL/hr over 60 Minutes Intravenous Every 24 hours 06/02/22 1445 06/03/22 1814   06/02/22 1445  vancomycin (VANCOREADY) IVPB 2000 mg/400 mL        2,000 mg 200 mL/hr over 120 Minutes Intravenous  Once 06/02/22 1432 06/02/22 1750   06/02/22 1445  piperacillin-tazobactam (ZOSYN) IVPB 3.375 g        3.375 g 12.5 mL/hr over 240 Minutes Intravenous Every 8 hours 06/02/22 1432          DVT prophylaxis: SCDs, she has allergy to heparin  Code Status: Full code  Family Communication: No family at bedside   CONSULTS General surgery   Objective    Physical Examination:   General-appears in no acute distress Heart-S1-S2, regular, no murmur auscultated Lungs-clear to auscultation bilaterally, no wheezing or crackles auscultated Abdomen-soft, nontender, no organomegaly, abdominal binder in place Extremities-no edema in the lower extremities Neuro-alert, oriented x3, no focal deficit noted  Status is: Inpatient:             Oswald Hillock   [facility] If 7PM-7AM, please contact night-coverage at [email or web address], Office  [phone]   06/04/2022, 2:00 PM  LOS: 1 day

## 2022-06-04 NOTE — Progress Notes (Signed)
Subjective/Chief Complaint: Patient's main complaint is dizziness.  Feels too unsteady to get out of bed, even with assistance. No BM yet.  Some flatus No worsening of her abdominal pain   Objective: Vital signs in last 24 hours: Temp:  [97.8 F (36.6 C)-99 F (37.2 C)] 99 F (37.2 C) (10/28 0729) Pulse Rate:  [78-105] 99 (10/28 0729) Resp:  [16-19] 19 (10/28 0729) BP: (109-157)/(56-67) 157/63 (10/28 0729) SpO2:  [90 %-92 %] 90 % (10/28 0729)    Intake/Output from previous day: 10/27 0701 - 10/28 0700 In: 480 [P.O.:480] Out: 20 [Drains:20] Intake/Output this shift: No intake/output data recorded.  WDWN in NAD Abd - soft, non-distended; incision intact with staples removed and new steri-strips. Drain - serous drainage, minimal amounts Tender towards lower end of incision near drain No peritonitis  Lab Results:  Recent Labs    06/02/22 1011 06/03/22 0444  WBC 18.1* 9.1  HGB 13.0 10.9*  HCT 39.3 33.2*  PLT 269 203   BMET Recent Labs    06/02/22 1011 06/03/22 0444  NA 134* 139  K 4.1 3.7  CL 98 103  CO2 25 29  GLUCOSE 208* 100*  BUN 13 12  CREATININE 1.29* 1.22*  CALCIUM 9.1 8.9   PT/INR No results for input(s): "LABPROT", "INR" in the last 72 hours. ABG No results for input(s): "PHART", "HCO3" in the last 72 hours.  Invalid input(s): "PCO2", "PO2"  Studies/Results: DG Chest 2 View  Result Date: 06/02/2022 CLINICAL DATA:  Postop fever EXAM: CHEST - 2 VIEW COMPARISON:  11/21/2021 FINDINGS: Heart and mediastinal contours are within normal limits. No focal opacities or effusions. No acute bony abnormality. IMPRESSION: No active cardiopulmonary disease. Electronically Signed   By: Rolm Baptise M.D.   On: 06/02/2022 13:38   CT ABDOMEN PELVIS W CONTRAST  Result Date: 06/02/2022 CLINICAL DATA:  Abdominal pain, recent hernia surgery EXAM: CT ABDOMEN AND PELVIS WITH CONTRAST TECHNIQUE: Multidetector CT imaging of the abdomen and pelvis was performed  using the standard protocol following bolus administration of intravenous contrast. RADIATION DOSE REDUCTION: This exam was performed according to the departmental dose-optimization program which includes automated exposure control, adjustment of the mA and/or kV according to patient size and/or use of iterative reconstruction technique. CONTRAST:  81m OMNIPAQUE IOHEXOL 350 MG/ML SOLN COMPARISON:  CT abdomen and pelvis dated January 28, 2022 FINDINGS: Lower chest: Small hiatal hernia. Mild bibasilar opacities, likely due to scarring or atelectasis. Coronary artery and mitral annular calcifications. Hepatobiliary: No focal liver abnormality is seen. Status post cholecystectomy. No biliary dilatation. Pancreas: Unremarkable. No pancreatic ductal dilatation or surrounding inflammatory changes. Spleen: Normal in size without focal abnormality. Adrenals/Urinary Tract: Adrenal glands are unremarkable. Kidneys are normal, without renal calculi, focal lesion, or hydronephrosis. Bladder is unremarkable. Stomach/Bowel: Mild wall thickening adjacent inflammatory change of the transverse colon, likely reactive. Prior right partial hemicolectomy. No evidence of obstruction. Vascular/Lymphatic: Aortic atherosclerosis. No enlarged abdominal or pelvic lymph nodes. Reproductive: Uterus and bilateral adnexa are unremarkable. Other: Postsurgical changes of the anterior abdominal wall, including trace fluid and air and surgical drain. No free fluid or free air seen in the abdomen or pelvis. Musculoskeletal: Prior posterior fusion of the lumbar spine. No aggressive appearing osseous lesions. IMPRESSION: 1. Expected postsurgical changes of ventral abdominal hernia repair. 2. Mild wall thickening and adjacent inflammatory change of the transverse colon, likely reactive. 3. Aortic Atherosclerosis (ICD10-I70.0). Electronically Signed   By: LYetta GlassmanM.D.   On: 06/02/2022 12:27    Anti-infectives: Anti-infectives (  From admission,  onward)    Start     Dose/Rate Route Frequency Ordered Stop   06/03/22 2000  vancomycin (VANCOREADY) IVPB 750 mg/150 mL        750 mg 150 mL/hr over 60 Minutes Intravenous Every 24 hours 06/03/22 1814     06/03/22 1500  vancomycin (VANCOREADY) IVPB 750 mg/150 mL  Status:  Discontinued        750 mg 150 mL/hr over 60 Minutes Intravenous Every 24 hours 06/02/22 1445 06/03/22 1814   06/02/22 1445  vancomycin (VANCOREADY) IVPB 2000 mg/400 mL        2,000 mg 200 mL/hr over 120 Minutes Intravenous  Once 06/02/22 1432 06/02/22 1750   06/02/22 1445  piperacillin-tazobactam (ZOSYN) IVPB 3.375 g        3.375 g 12.5 mL/hr over 240 Minutes Intravenous Every 8 hours 06/02/22 1432         Assessment/Plan: POD# 16 S/p Open repair of reducible nonobstructed incisional hernia with mesh in myofascial advancement flaps measuring 10 cm x 10 cm on 05/18/22 by Dr. Brantley Stage  - Readmit 10/26 with diffuse body aches, fatigue, abdominal pain, nausea CT scan fairly reassuring with expected postoperative changes and no intraabdominal fluid collections.  - Continue JP drain - SS today.  Will hopefully remove soon, which might help with the abdominal tenderness near the lower end of the incision - advance to full liquids - Dulcolax suppository to stimulate bowel movement - Hopefully will feel well enough to mobilize out of bed today   ID - zosyn, vancomycin FEN - IVF, fulls VTE - SCDs, per primary/ ok for chemical dvt ppx from surgical standpoint Foley - none    LOS: 1 day    [name] 06/04/2022

## 2022-06-04 NOTE — Plan of Care (Signed)

## 2022-06-05 LAB — BASIC METABOLIC PANEL
Anion gap: 10 (ref 5–15)
BUN: 6 mg/dL — ABNORMAL LOW (ref 8–23)
CO2: 24 mmol/L (ref 22–32)
Calcium: 8.9 mg/dL (ref 8.9–10.3)
Chloride: 103 mmol/L (ref 98–111)
Creatinine, Ser: 1.22 mg/dL — ABNORMAL HIGH (ref 0.44–1.00)
GFR, Estimated: 47 mL/min — ABNORMAL LOW (ref >60)
Glucose, Bld: 164 mg/dL — ABNORMAL HIGH (ref 70–99)
Potassium: 3.8 mmol/L (ref 3.5–5.1)
Sodium: 137 mmol/L (ref 135–145)

## 2022-06-05 LAB — GLUCOSE, CAPILLARY
Glucose-Capillary: 137 mg/dL — ABNORMAL HIGH (ref 70–99)
Glucose-Capillary: 114 mg/dL — ABNORMAL HIGH (ref 70–99)
Glucose-Capillary: 252 mg/dL — ABNORMAL HIGH (ref 70–99)
Glucose-Capillary: 230 mg/dL — ABNORMAL HIGH (ref 70–99)

## 2022-06-05 MED ORDER — GLUCERNA SHAKE PO LIQD: 237.0000 mL | Freq: Two times a day (BID) | ORAL | Status: DC

## 2022-06-05 MED ORDER — POLYETHYLENE GLYCOL 3350 17 G PO PACK: 17.0000 g | PACK | Freq: Every day | ORAL | Status: DC | PRN

## 2022-06-05 MED ORDER — RIVAROXABAN 10 MG PO TABS
10.0000 mg | ORAL_TABLET | Freq: Every day | ORAL | Status: DC
  Administered 2022-06-05 – 2022-06-06 (×2): 10 mg via ORAL
  Filled 2022-06-05 (×2): qty 1

## 2022-06-05 MED ORDER — DOCUSATE SODIUM 100 MG PO CAPS
100.0000 mg | ORAL_CAPSULE | Freq: Two times a day (BID) | ORAL | Status: DC
  Administered 2022-06-05 (×2): 100 mg via ORAL
  Filled 2022-06-05 (×3): qty 1

## 2022-06-05 NOTE — Evaluation (Signed)
Physical Therapy Evaluation Patient Details Name: [name] MRN: [id number] DOB: 07-02-1951 Today's Date: 06/05/2022  History of Present Illness  71 yo female presents to [facility] on 10/26 with abdominal pain, fits SIRS criteria. Pt with recent incisional hernia repair x2 weeks ago. PMH includes CAD, CVA, diabetes mellitus type 2, chronic diastolic heart failure, hypertension, hyperlipidemia, hypothyroidism, RLS.  Clinical Impression   Pt presents with min abdominal pain, but otherwise WFL and baseline level of strength, balance, functional mobility, and activity tolerance. Pt ambulatory for great hallway distance without AD, pt is mod I for all mobility for increased time only and anticipate this due to continued abdominal pain. Pt with no acute or post-acute PT needs at this time, PT to sign off, thank you.       Recommendations for follow up therapy are one component of a multi-disciplinary discharge planning process, led by the attending physician.  Recommendations may be updated based on patient status, additional functional criteria and insurance authorization.  Follow Up Recommendations No PT follow up      Assistance Recommended at Discharge PRN  Patient can return home with the following       Equipment Recommendations None recommended by PT  Recommendations for Other Services       Functional Status Assessment Patient has not had a recent decline in their functional status     Precautions / Restrictions Precautions Precautions: None Restrictions Weight Bearing Restrictions: No      Mobility  Bed Mobility Overal bed mobility: Modified Independent Bed Mobility: Supine to Sit     Supine to sit: Modified independent (Device/Increase time)          Transfers Overall transfer level: Independent Equipment used: None                    Ambulation/Gait Ambulation/Gait assistance: Modified independent (Device/Increase time) Gait Distance (Feet): 450  Feet Assistive device: None Gait Pattern/deviations: Step-through pattern, Decreased stride length Gait velocity: slightly decr     General Gait Details: min increased time, no evidence of unsteadiness and good tolerance  Stairs            Wheelchair Mobility    Modified Rankin (Stroke Patients Only)       Balance Overall balance assessment: Modified Independent                                           Pertinent Vitals/Pain Pain Assessment Pain Assessment: 0-10 Pain Score: 2  Pain Location: stomach Pain Descriptors / Indicators: Sore Pain Intervention(s): Monitored during session, Limited activity within patient's tolerance, Repositioned    Home Living Family/patient expects to be discharged to:: Private residence Living Arrangements: Spouse/significant other Available Help at Discharge: Family;Available 24 hours/day Type of Home: House Home Access: Stairs to enter   [employer] of Steps: 3   Home Layout: One level Home Equipment: Agricultural consultant (2 wheels);Cane - single point;BSC/3in1;Shower seat - built in      Prior Function Prior Level of Function : Independent/Modified Independent                     Hand Dominance   Dominant Hand: Right    Extremity/Trunk Assessment   Upper Extremity Assessment Upper Extremity Assessment: Overall WFL for tasks assessed    Lower Extremity Assessment Lower Extremity Assessment: Overall WFL for tasks assessed  Cervical / Trunk Assessment Cervical / Trunk Assessment: Normal  Communication   Communication: No difficulties  Cognition Arousal/Alertness: Awake/alert Behavior During Therapy: WFL for tasks assessed/performed Overall Cognitive Status: Within Functional Limits for tasks assessed                                          General Comments      Exercises     Assessment/Plan    PT Assessment Patient does not need any further PT services   PT Problem List         PT Treatment Interventions      PT Goals (Current goals can be found in the Care Plan section)  Acute Rehab PT Goals Patient Stated Goal: home PT Goal Formulation: With patient Time For Goal Achievement: 06/05/22 Potential to Achieve Goals: Good    Frequency       Co-evaluation               AM-PAC PT "6 Clicks" Mobility  Outcome Measure Help needed turning from your back to your side while in a flat bed without using bedrails?: None Help needed moving from lying on your back to sitting on the side of a flat bed without using bedrails?: None Help needed moving to and from a bed to a chair (including a wheelchair)?: None Help needed standing up from a chair using your arms (e.g., wheelchair or bedside chair)?: None Help needed to walk in hospital room?: None Help needed climbing 3-5 steps with a railing? : None 6 Click Score: 24    End of Session   Activity Tolerance: Patient tolerated treatment well Patient left: in chair;with call bell/phone within reach Nurse Communication: Mobility status PT Visit Diagnosis: Other abnormalities of gait and mobility (R26.89)    Time: 1610-9604 PT Time Calculation (min) (ACUTE ONLY): 24 min   Charges:   PT Evaluation $PT Eval Low Complexity: 1 Low         Devyne Hauger S, PT DPT Acute Rehabilitation Services Pager [phone]  Office [phone]   Tyrone Apple E Christain [place] 06/05/2022, 1:37 PM

## 2022-06-05 NOTE — Progress Notes (Signed)
  Transition of Care [facility]) Screening Note   Patient Details  Name: [name] Date of Birth: 1951-03-20   Transition of Care [facility]) CM/SW Contact:    Bartholomew Crews, RN Phone Number: [phone] 06/05/2022, 1:42 PM    Transition of Care Department [facility]) has reviewed patient and no TOC needs have been identified at this time. We will continue to monitor patient advancement through interdisciplinary progression rounds. If new patient transition needs arise, please place a TOC consult.

## 2022-06-05 NOTE — Progress Notes (Addendum)
[facility]  PROGRESS NOTE  [name] CZY:[id number] DOB: 06/22/1951 DOA: 06/02/2022 PCP: Morgan Stains, MD   Brief HPI:   71 year old female with medical history of CAD, CVA, diabetes mellitus type 2, chronic diastolic heart failure, hypertension, hyperlipidemia, hypothyroidism, RLS came with abdominal pain patient recently had incisional hernia repair 2 weeks ago.  Patient said that she had developed fever abdominal pain started yesterday.  Abdominal binder is in place.  She has a right lower quadrant drain with serosanguineous discharge which is unchanged from previous hospitalization.  General surgery was consulted, CT scan abdomen showed only mild colitis.  No other abnormality noted.    Subjective   Patient seen and examined, feels better this morning.  Denies nausea.  CBG better controlled after increasing dose of Lantus to 22 units subcu twice daily.   Assessment/Plan:    Abdominal pain/SIRS -Presented with tachycardia, leukocytosis, abdominal pain, lactic acid 1.3 -CT abdomen/pelvis was reassuring -Patient started on vancomycin and Zosyn -Blood cultures obtained, result is pending -Urine culture grew insignificant growth  CAD -Stable -Continue aspirin, rosuvastatin  Diabetes mellitus type 2 -Recent HbA1c was 9.8 -Patient was supposed to get Lantus 44 units subcu yesterday however did not get it -CBG was elevated to 400 -Improved after changing dose of Lantus to 22 units subcu twice daily -Continue sliding scale insulin with NovoLog   Chronic diastolic heart failure -Patient has grade 1 diastolic dysfunction with preserved EF and 2019 -Appears to be compensated  Hypertension -Continue Toprol XL -Irbesartan HCTZ on hold -We will continue to hold amlodipine  CKD stage IIIa -Creatinine stable -HCTZ , ACE inhibitor on hold due to hypotension  Hypothyroidism -Continue Synthroid  Mood disorder -Continue escitalopram, imipramine,   temazepam  Medications     aspirin EC  81 mg Oral Daily   bisacodyl  10 mg Rectal Once   docusate sodium  100 mg Oral BID   escitalopram  5 mg Oral Daily   feeding supplement (GLUCERNA SHAKE)  237 mL Oral BID BM   gabapentin  600 mg Oral QHS   imipramine  50 mg Oral QHS   insulin aspart  0-15 Units Subcutaneous TID WC   insulin aspart  0-5 Units Subcutaneous QHS   insulin glargine-yfgn  22 Units Subcutaneous BID   levothyroxine  175 mcg Oral QAC breakfast   loratadine  10 mg Oral Daily   metoprolol succinate  25 mg Oral Daily   pantoprazole  40 mg Oral QAC breakfast   rivaroxaban  10 mg Oral Daily   rosuvastatin  20 mg Oral q AM   saccharomyces boulardii  250 mg Oral q AM   temazepam  15 mg Oral QHS     Data Reviewed:   CBG:  Recent Labs  Lab 06/04/22 1200 06/04/22 1744 06/04/22 2015 06/05/22 0724 06/05/22 1134  GLUCAP 404* 323* 231* 137* 114*    SpO2: 98 % O2 Flow Rate (L/min): 2 L/min    Vitals:   06/04/22 1415 06/04/22 2137 06/05/22 0551 06/05/22 0908  BP: (!) 122/92 (!) 150/61 (!) 143/66 (!) 148/87  Pulse: 97 94 88 95  Resp: '17 20 19 18  '$ Temp: 98.3 F (36.8 C) 98.6 F (37 C) 98.6 F (37 C) 98.2 F (36.8 C)  TempSrc: Oral Oral Oral Oral  SpO2: 92% 92% 91% 98%  Weight:      Height:          Data Reviewed:  Basic Metabolic Panel: Recent Labs  Lab 06/02/22 1011 06/03/22 0444  06/04/22 1004 06/05/22 0329  NA 134* 139  --  137  K 4.1 3.7  --  3.8  CL 98 103  --  103  CO2 25 29  --  24  GLUCOSE 208* 100* 396* 164*  BUN 13 12  --  6*  CREATININE 1.29* 1.22*  --  1.22*  CALCIUM 9.1 8.9  --  8.9    CBC: Recent Labs  Lab 06/02/22 1011 06/03/22 0444  WBC 18.1* 9.1  HGB 13.0 10.9*  HCT 39.3 33.2*  MCV 83.6 84.5  PLT 269 203    LFT Recent Labs  Lab 06/02/22 1011  AST 11*  ALT 11  ALKPHOS 76  BILITOT 1.3*  PROT 6.6  ALBUMIN 3.2*     Antibiotics: Anti-infectives (From admission, onward)    Start     Dose/Rate Route  Frequency Ordered Stop   06/03/22 2000  vancomycin (VANCOREADY) IVPB 750 mg/150 mL        750 mg 150 mL/hr over 60 Minutes Intravenous Every 24 hours 06/03/22 1814     06/03/22 1500  vancomycin (VANCOREADY) IVPB 750 mg/150 mL  Status:  Discontinued        750 mg 150 mL/hr over 60 Minutes Intravenous Every 24 hours 06/02/22 1445 06/03/22 1814   06/02/22 1445  vancomycin (VANCOREADY) IVPB 2000 mg/400 mL        2,000 mg 200 mL/hr over 120 Minutes Intravenous  Once 06/02/22 1432 06/02/22 1750   06/02/22 1445  piperacillin-tazobactam (ZOSYN) IVPB 3.375 g        3.375 g 12.5 mL/hr over 240 Minutes Intravenous Every 8 hours 06/02/22 1432          DVT prophylaxis: SCDs, she has allergy to heparin  Code Status: Full code  Family Communication: No family at bedside   CONSULTS General surgery   Objective    Physical Examination:  General-appears in no acute distress Heart-S1-S2, regular, no murmur auscultated Lungs-clear to auscultation bilaterally, no wheezing or crackles auscultated Abdomen-soft, nontender, no organomegaly Extremities-no edema in the lower extremities Neuro-alert, oriented x3, no focal deficit noted   Status is: Inpatient:             [name]   [facility] If 7PM-7AM, please contact night-coverage at [email or web address], Office  [phone]   06/05/2022, 2:10 PM  LOS: 2 days

## 2022-06-05 NOTE — Progress Notes (Signed)
   Subjective/Chief Complaint: Feeling ok. +flatus and BM Tolerated thick liquids No worsening of her abdominal pain   Objective: Vital signs in last 24 hours: Temp:  [98.2 F (36.8 C)-98.6 F (37 C)] 98.2 F (36.8 C) (10/29 0908) Pulse Rate:  [88-97] 95 (10/29 0908) Resp:  [17-20] 18 (10/29 0908) BP: (122-150)/(61-92) 148/87 (10/29 0908) SpO2:  [91 %-98 %] 98 % (10/29 0908) Last BM Date : 06/04/22  Intake/Output from previous day: 10/28 0701 - 10/29 0700 In: 2708.1 [P.O.:480; I.V.:1534.4; IV Piggyback:693.8] Out: 2850 [Urine:2850] Intake/Output this shift: Total I/O In: 699.1 [I.V.:649.2; IV Piggyback:49.9] Out: 5 [Drains:5]  WDWN in NAD Abd - soft, non-distended; incision intact with staples removed and new steri-strips. Drain - serous drainage, minimal amounts Tender towards lower end of incision near drain No peritonitis  Lab Results:  Recent Labs    06/03/22 0444  WBC 9.1  HGB 10.9*  HCT 33.2*  PLT 203    BMET Recent Labs    06/03/22 0444 06/04/22 1004 06/05/22 0329  NA 139  --  137  K 3.7  --  3.8  CL 103  --  103  CO2 29  --  24  GLUCOSE 100* 396* 164*  BUN 12  --  6*  CREATININE 1.22*  --  1.22*  CALCIUM 8.9  --  8.9    PT/INR No results for input(s): "LABPROT", "INR" in the last 72 hours. ABG No results for input(s): "PHART", "HCO3" in the last 72 hours.  Invalid input(s): "PCO2", "PO2"  Studies/Results: No results found.  Anti-infectives: Anti-infectives (From admission, onward)    Start     Dose/Rate Route Frequency Ordered Stop   06/03/22 2000  vancomycin (VANCOREADY) IVPB 750 mg/150 mL        750 mg 150 mL/hr over 60 Minutes Intravenous Every 24 hours 06/03/22 1814     06/03/22 1500  vancomycin (VANCOREADY) IVPB 750 mg/150 mL  Status:  Discontinued        750 mg 150 mL/hr over 60 Minutes Intravenous Every 24 hours 06/02/22 1445 06/03/22 1814   06/02/22 1445  vancomycin (VANCOREADY) IVPB 2000 mg/400 mL        2,000 mg 200  mL/hr over 120 Minutes Intravenous  Once 06/02/22 1432 06/02/22 1750   06/02/22 1445  piperacillin-tazobactam (ZOSYN) IVPB 3.375 g        3.375 g 12.5 mL/hr over 240 Minutes Intravenous Every 8 hours 06/02/22 1432         Assessment/Plan: POD# 17 S/p Open repair of reducible nonobstructed incisional hernia with mesh in myofascial advancement flaps measuring 10 cm x 10 cm on 05/18/22 by Dr. Brantley Stage  - Readmit 10/26 with diffuse body aches, fatigue, abdominal pain, nausea CT scan fairly reassuring with expected postoperative changes and no intraabdominal fluid collections.  - Continue JP drain - SS today.  Will hopefully remove soon, which might help with the abdominal tenderness near the lower end of the incision - advance to carb modified - added bowel regimen today   ID - zosyn, vancomycin -  FEN - decrease IVF, adv to carb modified VTE - SCDs, per primary/ ok for chemical dvt ppx from surgical standpoint; d/w TRH Foley - none   Dispo - prob home Monday, will remove drain prior to dc - minimal output  [name]. Morgan Pulling, MD, [place], Bariatric, [facility] Minimally Invasive Surgery [facility],  [facility]   LOS: 2 days    [name] 06/05/2022

## 2022-06-05 NOTE — Progress Notes (Signed)
Pharmacy Antibiotic Note  [name] is a 71 y.o. female on day # 4 Vancomycin and Zosyn for abdominal and sepsis coverage.  S/p open hernia repair 05/18/22 and JP drain in place.   Renal function stable, cultures negative to date.  Surgery notes possible drain removal and discharge home on 10/30.  Plan: Continue Zosyn 3.375 gm IV q8h (each over 4 hours). Continue Vancomycin 750 mg IV q24h AUC goal 400-550 eAUC 519 using SCr 1.29 Follow renal function, culture data, clinical progress and antibiotic plans.   Height: '5\' 3"'$  (160 cm) Weight: 98 kg (216 lb 0.8 oz) IBW/kg (Calculated) : 52.4  Temp (24hrs), Avg:98.4 F (36.9 C), Min:98.2 F (36.8 C), Max:98.6 F (37 C)  Recent Labs  Lab 06/02/22 1011 06/03/22 0444 06/05/22 0329  WBC 18.1* 9.1  --   CREATININE 1.29* 1.22* 1.22*  LATICACIDVEN 1.3  --   --     Estimated Creatinine Clearance: 47.1 mL/min (A) (by C-G formula based on SCr of 1.22 mg/dL (H)).    Allergies  Allergen Reactions   Dilaudid [Hydromorphone Hcl] Other (See Comments)    Hallucinations/ argumentative, goes beserk   Heparin Other (See Comments)    HIT ab positive  (SRA negative 11/25/17)    Ambien [Zolpidem Tartrate] Other (See Comments)    Up sleep walking and eating   Belsomra [Suvorexant]     ineffective Other reaction(s): Other ineffective   Codeine Nausea And Vomiting   Morphine And Related Other (See Comments)    Flushing and feeling hot Tolerates with Diphenhydramine   Penicillins Rash and Other (See Comments)    Caused Headaches also Has patient had a PCN reaction causing immediate rash, facial/tongue/throat swelling, SOB or lightheadedness with hypotension: No Has patient had a PCN reaction causing severe rash involving mucus membranes or skin necrosis: No Has patient had a PCN reaction that required hospitalization No Has patient had a PCN reaction occurring within the last 10 years: No If all of the above answers are "NO", then may proceed  with Cephalosporin use.     Statins Other (See Comments)    Muscle weakness, cramps and aching all over body   Ceftin [Cefuroxime Axetil] Other (See Comments)    Stomach pain    Elavil [Amitriptyline] Other (See Comments)    Loopy feeling   Lovaza [Omega-3-Acid Ethyl Esters] Other (See Comments)    Stomach pain    Lunesta [Eszopiclone] Other (See Comments)    Causes dizziness   Lyrica [Pregabalin] Nausea Only   Metformin And Related Nausea Only    Antimicrobials this admission: Zosyn 10/26>> Vancomycin 10/26 >>  Dose adjustments this admission: n/a  Microbiology results: 10/26 blood: no growth x 2 days to date 10/26 COVID: neg 10/26 urine: insignificant growth  Thank you for allowing pharmacy to be a part of this patient's care.  Arty Baumgartner, [place] 06/05/2022 12:19 PM

## 2022-06-05 NOTE — Plan of Care (Signed)
  Problem: Education: Goal: Ability to describe self-care measures that may prevent or decrease complications (Diabetes Survival Skills Education) will improve Outcome: Not Progressing Goal: Individualized Educational Video(s) Outcome: Not Progressing   Problem: Coping: Goal: Ability to adjust to condition or change in health will improve Outcome: Not Progressing   Problem: Health Behavior/Discharge Planning: Goal: Ability to identify and utilize available resources and services will improve Outcome: Not Progressing Goal: Ability to manage health-related needs will improve Outcome: Not Progressing   Problem: Metabolic: Goal: Ability to maintain appropriate glucose levels will improve Outcome: Not Progressing   Problem: Nutritional: Goal: Maintenance of adequate nutrition will improve Outcome: Not Progressing Goal: Progress toward achieving an optimal weight will improve Outcome: Not Progressing   Problem: Tissue Perfusion: Goal: Adequacy of tissue perfusion will improve Outcome: Not Progressing   Problem: Education: Goal: Knowledge of General Education information will improve Description: Including pain rating scale, medication(s)/side effects and non-pharmacologic comfort measures Outcome: Not Progressing

## 2022-06-06 LAB — GLUCOSE, CAPILLARY
Glucose-Capillary: 95 mg/dL (ref 70–99)
Glucose-Capillary: 186 mg/dL — ABNORMAL HIGH (ref 70–99)

## 2022-06-06 NOTE — Discharge Summary (Signed)
Physician Discharge Summary   Patient: [name] MRN: [id number] DOB: September 26, 1950  Admit date:     06/02/2022  Discharge date: 06/06/22  Discharge Physician: Oswald Hillock   PCP: Harlan Stains, MD   Recommendations at discharge:   Follow-up general surgery as outpatient  Discharge Diagnoses: Principal Problem:   SIRS (systemic inflammatory response syndrome) (HCC) Active Problems:   Type II diabetes mellitus (HCC)   Hypothyroidism   Hypertension   Atypical chest pain   Diastolic dysfunction   Hyperlipidemia   Chronic kidney disease, stage 3a (HCC)   Mood disorder (HCC)   Postoperative infection  Resolved Problems:   * No resolved hospital problems. *  Hospital Course: 71 year old female with medical history of CAD, CVA, diabetes mellitus type 2, chronic diastolic heart failure, hypertension, hyperlipidemia, hypothyroidism, RLS came with abdominal pain patient recently had incisional hernia repair 2 weeks ago.  Patient said that she had developed fever abdominal pain started yesterday.  Abdominal binder is in place.  She has a right lower quadrant drain with serosanguineous discharge which is unchanged from previous hospitalization.  General surgery was consulted, CT scan abdomen showed only mild colitis.  No other abnormality noted   Assessment and Plan:  Abdominal pain/SIRS -Resolved -Presented with tachycardia, leukocytosis, abdominal pain, lactic acid 1.3 -CT abdomen/pelvis was reassuring -Patient started on vancomycin and Zosyn; antibiotics will be discontinued as per general surgery recommendation -Blood cultures obtained, result is negative to date. -Urine culture grew insignificant growth -Cleared for discharge as per general surgery recommendation   CAD -Stable -Continue aspirin, rosuvastatin   Diabetes mellitus type 2 -Recent HbA1c was 9.8 -Patient was supposed to get Lantus 44 units subcu yesterday however did not get it -CBG was elevated to  400 -Improved after changing dose of Lantus to 22 units subcu twice daily -Continue Lantus with home regimen     Chronic diastolic heart failure -Patient has grade 1 diastolic dysfunction with preserved EF and 2019 -Appears to be compensated -Continue home diuretics   Hypertension -Continue home medications   CKD stage IIIa -Creatinine stable    Hypothyroidism -Continue Synthroid   Mood disorder -Continue home medications       Consultants: General surgery Procedures performed: None Disposition: Home Diet recommendation:  Discharge Diet Orders (From admission, onward)     Start     Ordered   06/06/22 0000  Diet - low sodium heart healthy        06/06/22 1347           Regular diet DISCHARGE MEDICATION: Allergies as of 06/06/2022       Reactions   Dilaudid [hydromorphone Hcl] Other (See Comments)   Hallucinations/ argumentative, goes beserk   Heparin Other (See Comments)   HIT ab positive  (SRA negative 11/25/17)    Ambien [zolpidem Tartrate] Other (See Comments)   Up sleep walking and eating   Belsomra [suvorexant]    ineffective Other reaction(s): Other ineffective   Codeine Nausea And Vomiting   Morphine And Related Other (See Comments)   Flushing and feeling hot Tolerates with Diphenhydramine   Penicillins Rash, Other (See Comments)   Caused Headaches also Has patient had a PCN reaction causing immediate rash, facial/tongue/throat swelling, SOB or lightheadedness with hypotension: No Has patient had a PCN reaction causing severe rash involving mucus membranes or skin necrosis: No Has patient had a PCN reaction that required hospitalization No Has patient had a PCN reaction occurring within the last 10 years: No If all of the  above answers are "NO", then may proceed with Cephalosporin use.   Statins Other (See Comments)   Muscle weakness, cramps and aching all over body   Ceftin [cefuroxime Axetil] Other (See Comments)   Stomach pain    Elavil  [amitriptyline] Other (See Comments)   Loopy feeling   Lovaza [omega-3-acid Ethyl Esters] Other (See Comments)   Stomach pain    Lunesta [eszopiclone] Other (See Comments)   Causes dizziness   Lyrica [pregabalin] Nausea Only   Metformin And Related Nausea Only        Medication List     TAKE these medications    acetaminophen 500 MG tablet Commonly known as: TYLENOL Take 1,000 mg by mouth every 8 (eight) hours as needed for mild pain or headache.   amLODipine 2.5 MG tablet Commonly known as: NORVASC Take 2 tablets (5 mg total) by mouth daily. What changed:  how much to take when to take this   aspirin EC 81 MG tablet Take 81 mg by mouth daily.   cetirizine 10 MG tablet Commonly known as: ZYRTEC Take 10 mg by mouth at bedtime.   Clobetasol Propionate 0.05 % external spray Commonly known as: TEMOVATE Apply 1 spray topically at bedtime as needed (scalp eczema).   cyanocobalamin 1000 MCG tablet Commonly known as: VITAMIN B12 Take 1,000 mcg by mouth in the morning.   escitalopram 5 MG tablet Commonly known as: LEXAPRO Take 5 mg by mouth daily.   gabapentin 300 MG capsule Commonly known as: NEURONTIN Take 600 mg by mouth at bedtime.   Gvoke HypoPen 1-Pack 1 MG/0.2ML Soaj Generic drug: Glucagon Inject 1 mg into the skin as needed (hypoglycemia).   HumaLOG 100 UNIT/ML injection Generic drug: insulin lispro Inject 15 Units into the skin with breakfast, with lunch, and with evening meal.   ibandronate 150 MG tablet Commonly known as: BONIVA Take 150 mg by mouth every 30 (thirty) days.   imipramine 50 MG tablet Commonly known as: Tofranil Take 1 tablet (50 mg total) by mouth daily. What changed: when to take this   insulin glargine 100 UNIT/ML injection Commonly known as: LANTUS Inject 44 Units into the skin in the morning.   irbesartan-hydrochlorothiazide 300-12.5 MG tablet Commonly known as: AVALIDE Take 1 tablet by mouth in the morning.    levothyroxine 175 MCG tablet Commonly known as: SYNTHROID Take 175 mcg by mouth daily before breakfast.   LORazepam 0.5 MG tablet Commonly known as: ATIVAN Take 0.5-1 mg by mouth every 8 (eight) hours as needed for anxiety.   Magnesium 250 MG Tabs Take 250 mg by mouth in the morning.   metoprolol succinate 25 MG 24 hr tablet Commonly known as: TOPROL-XL Take 1 tablet (25 mg total) by mouth daily.   oxyCODONE 5 MG immediate release tablet Commonly known as: Roxicodone Take 1 tablet (5 mg total) by mouth every 4 (four) hours as needed for severe pain.   pantoprazole 40 MG tablet Commonly known as: PROTONIX Take 40 mg by mouth daily before breakfast.   Potassium 99 MG Tabs Take 99 mg by mouth in the morning.   PROBIOTIC PO Take 1 capsule by mouth in the morning.   promethazine 25 MG tablet Commonly known as: PHENERGAN Take 25 mg by mouth every 8 (eight) hours as needed for nausea or vomiting.   rosuvastatin 20 MG tablet Commonly known as: CRESTOR Take 20 mg by mouth in the morning.   temazepam 15 MG capsule Commonly known as: RESTORIL Take 15 mg by  mouth at bedtime.   tiZANidine 4 MG tablet Commonly known as: ZANAFLEX Take 4 mg by mouth every 6 (six) hours as needed for muscle spasms.   Vitamin D3 125 MCG (5000 UT) Tabs Take 5,000 Units by mouth in the morning.   zinc sulfate 220 (50 Zn) MG capsule Take 220 mg by mouth in the morning.        Follow-up Information     Harlan Stains, MD Follow up.   Specialty: Family Medicine Contact information: [address]  [phone]                Discharge Exam: Danley Danker Weights   06/02/22 1013 06/02/22 2046  Weight: 90.7 kg 98 kg   General-appears in no acute distress Heart-S1-S2, regular, no murmur auscultated Lungs-clear to auscultation bilaterally, no wheezing or crackles auscultated Abdomen-soft, nontender, no organomegaly Extremities-no edema in the lower  extremities Neuro-alert, oriented x3, no focal deficit noted  Condition at discharge: good  The results of significant diagnostics from this hospitalization (including imaging, microbiology, ancillary and laboratory) are listed below for reference.   Imaging Studies: DG Chest 2 View  Result Date: 06/02/2022 CLINICAL DATA:  Postop fever EXAM: CHEST - 2 VIEW COMPARISON:  11/21/2021 FINDINGS: Heart and mediastinal contours are within normal limits. No focal opacities or effusions. No acute bony abnormality. IMPRESSION: No active cardiopulmonary disease. Electronically Signed   By: Rolm Baptise M.D.   On: 06/02/2022 13:38   CT ABDOMEN PELVIS W CONTRAST  Result Date: 06/02/2022 CLINICAL DATA:  Abdominal pain, recent hernia surgery EXAM: CT ABDOMEN AND PELVIS WITH CONTRAST TECHNIQUE: Multidetector CT imaging of the abdomen and pelvis was performed using the standard protocol following bolus administration of intravenous contrast. RADIATION DOSE REDUCTION: This exam was performed according to the departmental dose-optimization program which includes automated exposure control, adjustment of the mA and/or kV according to patient size and/or use of iterative reconstruction technique. CONTRAST:  26m OMNIPAQUE IOHEXOL 350 MG/ML SOLN COMPARISON:  CT abdomen and pelvis dated January 28, 2022 FINDINGS: Lower chest: Small hiatal hernia. Mild bibasilar opacities, likely due to scarring or atelectasis. Coronary artery and mitral annular calcifications. Hepatobiliary: No focal liver abnormality is seen. Status post cholecystectomy. No biliary dilatation. Pancreas: Unremarkable. No pancreatic ductal dilatation or surrounding inflammatory changes. Spleen: Normal in size without focal abnormality. Adrenals/Urinary Tract: Adrenal glands are unremarkable. Kidneys are normal, without renal calculi, focal lesion, or hydronephrosis. Bladder is unremarkable. Stomach/Bowel: Mild wall thickening adjacent inflammatory change of the  transverse colon, likely reactive. Prior right partial hemicolectomy. No evidence of obstruction. Vascular/Lymphatic: Aortic atherosclerosis. No enlarged abdominal or pelvic lymph nodes. Reproductive: Uterus and bilateral adnexa are unremarkable. Other: Postsurgical changes of the anterior abdominal wall, including trace fluid and air and surgical drain. No free fluid or free air seen in the abdomen or pelvis. Musculoskeletal: Prior posterior fusion of the lumbar spine. No aggressive appearing osseous lesions. IMPRESSION: 1. Expected postsurgical changes of ventral abdominal hernia repair. 2. Mild wall thickening and adjacent inflammatory change of the transverse colon, likely reactive. 3. Aortic Atherosclerosis (ICD10-I70.0). Electronically Signed   By: LYetta GlassmanM.D.   On: 06/02/2022 12:27    Microbiology: Results for orders placed or performed during the hospital encounter of 06/02/22  Culture, blood (single)     Status: None (Preliminary result)   Collection Time: 06/02/22 10:06 AM   Specimen: BLOOD  Result Value Ref Range Status   Specimen Description BLOOD RIGHT ANTECUBITAL  Final  Special Requests   Final    BOTTLES DRAWN AEROBIC AND ANAEROBIC Blood Culture adequate volume   Culture   Final    NO GROWTH 4 DAYS Performed at [facility], [place] [address] [place], [place] [address]    Report Status PENDING  Incomplete  SARS Coronavirus 2 by RT PCR (hospital order, performed in [facility] hospital lab) *cepheid single result test* Anterior Nasal Swab     Status: None   Collection Time: 06/02/22  3:14 PM   Specimen: Anterior Nasal Swab  Result Value Ref Range Status   SARS Coronavirus 2 by RT PCR NEGATIVE NEGATIVE Final    Comment: (NOTE) SARS-CoV-2 target nucleic acids are NOT DETECTED.  The SARS-CoV-2 RNA is generally detectable in upper and lower respiratory specimens during the acute phase of infection. The lowest concentration of SARS-CoV-2 viral copies this assay can  detect is 250 copies / mL. A negative result does not preclude SARS-CoV-2 infection and should not be used as the sole basis for treatment or other patient management decisions.  A negative result may occur with improper specimen collection / handling, submission of specimen other than nasopharyngeal swab, presence of viral mutation(s) within the areas targeted by this assay, and inadequate number of viral copies (<250 copies / mL). A negative result must be combined with clinical observations, patient history, and epidemiological information.  Fact Sheet for Patients:   https://www.patel.info/  Fact Sheet for Healthcare Providers: https://hall.com/  This test is not yet approved or  cleared by the [place] FDA and has been authorized for detection and/or diagnosis of SARS-CoV-2 by FDA under an Emergency Use Authorization (EUA).  This EUA will remain in effect (meaning this test can be used) for the duration of the COVID-19 declaration under Section 564(b)(1) of the Act, 21 U.S.C. section 360bbb-3(b)(1), unless the authorization is terminated or revoked sooner.  Performed at [facility], [place] [address] [place], [place] [address]   Urine Culture     Status: Abnormal   Collection Time: 06/02/22  3:35 PM   Specimen: In/Out Cath Urine  Result Value Ref Range Status   Specimen Description IN/OUT CATH URINE  Final   Special Requests NONE  Final   Culture (A)  Final    <10,000 COLONIES/mL INSIGNIFICANT GROWTH Performed at [facility], [place] [address], [place], [place] [address]    Report Status 06/04/2022 FINAL  Final    Labs: CBC: Recent Labs  Lab 06/02/22 1011 06/03/22 0444  WBC 18.1* 9.1  HGB 13.0 10.9*  HCT 39.3 33.2*  MCV 83.6 84.5  PLT 269 852   Basic Metabolic Panel: Recent Labs  Lab 06/02/22 1011 06/03/22 0444 06/04/22 1004 06/05/22 0329  NA 134* 139  --  137  K 4.1 3.7  --  3.8  CL 98 103  --   103  CO2 25 29  --  24  GLUCOSE 208* 100* 396* 164*  BUN 13 12  --  6*  CREATININE 1.29* 1.22*  --  1.22*  CALCIUM 9.1 8.9  --  8.9   Liver Function Tests: Recent Labs  Lab 06/02/22 1011  AST 11*  ALT 11  ALKPHOS 76  BILITOT 1.3*  PROT 6.6  ALBUMIN 3.2*   CBG: Recent Labs  Lab 06/05/22 1134 06/05/22 1627 06/05/22 2128 06/06/22 0812 06/06/22 1138  GLUCAP 114* 252* 230* 95 186*    Discharge time spent: greater than 30 minutes.  Signed: Oswald Hillock, MD [facility] 06/06/2022

## 2022-06-06 NOTE — Care Management Important Message (Signed)
Important Message  Patient Details  Name: [name] MRN: [id number] Date of Birth: [birth year]   Medicare Important Message Given:  Yes     Yaneli Keithley 06/06/2022, 11:52 AM

## 2022-06-06 NOTE — Progress Notes (Signed)
Subjective/Chief Complaint: Pt with normal bowel fxn Tol PO well Mobilizing   Objective: Vital signs in last 24 hours: Temp:  [98.3 F (36.8 C)-98.5 F (36.9 C)] 98.3 F (36.8 C) (10/30 0810) Pulse Rate:  [87-91] 91 (10/30 0810) Resp:  [16-18] 16 (10/30 0553) BP: (147-166)/(70-82) 153/82 (10/30 0810) SpO2:  [94 %-97 %] 94 % (10/30 0810) Last BM Date : 06/05/22  Intake/Output from previous day: 10/29 0701 - 10/30 0700 In: 939.1 [P.O.:240; I.V.:649.2; IV Piggyback:49.9] Out: 6 [Urine:1; Drains:5] Intake/Output this shift: Total I/O In: -  Out: 5 [Drains:5]  PE:  Constitutional: No acute distress, conversant, appears states age. Eyes: Anicteric sclerae, moist conjunctiva, no lid lag Lungs: Clear to auscultation bilaterally, normal respiratory effort CV: regular rate and rhythm, no murmurs, no peripheral edema, pedal pulses 2+ GI: Soft, no masses or hepatosplenomegaly, non-tender to palpation, Dr-SS Skin: No rashes, palpation reveals normal turgor Psychiatric: appropriate judgment and insight, oriented to person, place, and time   Lab Results:  No results for input(s): "WBC", "HGB", "HCT", "PLT" in the last 72 hours. BMET Recent Labs    06/04/22 1004 06/05/22 0329  NA  --  137  K  --  3.8  CL  --  103  CO2  --  24  GLUCOSE 396* 164*  BUN  --  6*  CREATININE  --  1.22*  CALCIUM  --  8.9   PT/INR No results for input(s): "LABPROT", "INR" in the last 72 hours. ABG No results for input(s): "PHART", "HCO3" in the last 72 hours.  Invalid input(s): "PCO2", "PO2"  Studies/Results: No results found.  Anti-infectives: Anti-infectives (From admission, onward)    Start     Dose/Rate Route Frequency Ordered Stop   06/03/22 2000  vancomycin (VANCOREADY) IVPB 750 mg/150 mL        750 mg 150 mL/hr over 60 Minutes Intravenous Every 24 hours 06/03/22 1814     06/03/22 1500  vancomycin (VANCOREADY) IVPB 750 mg/150 mL  Status:  Discontinued        750 mg 150  mL/hr over 60 Minutes Intravenous Every 24 hours 06/02/22 1445 06/03/22 1814   06/02/22 1445  vancomycin (VANCOREADY) IVPB 2000 mg/400 mL        2,000 mg 200 mL/hr over 120 Minutes Intravenous  Once 06/02/22 1432 06/02/22 1750   06/02/22 1445  piperacillin-tazobactam (ZOSYN) IVPB 3.375 g        3.375 g 12.5 mL/hr over 240 Minutes Intravenous Every 8 hours 06/02/22 1432         Assessment/Plan: Expand All Collapse All       Subjective/Chief Complaint: Feeling ok. +flatus and BM Tolerated thick liquids No worsening of her abdominal pain     Objective: Vital signs in last 24 hours: Temp:  [98.2 F (36.8 C)-98.6 F (37 C)] 98.2 F (36.8 C) (10/29 0908) Pulse Rate:  [88-97] 95 (10/29 0908) Resp:  [17-20] 18 (10/29 0908) BP: (122-150)/(61-92) 148/87 (10/29 0908) SpO2:  [91 %-98 %] 98 % (10/29 0908) Last BM Date : 06/04/22   Intake/Output from previous day: 10/28 0701 - 10/29 0700 In: 2708.1 [P.O.:480; I.V.:1534.4; IV Piggyback:693.8] Out: 2850 [Urine:2850] Intake/Output this shift: Total I/O In: 699.1 [I.V.:649.2; IV Piggyback:49.9] Out: 5 [Drains:5]   WDWN in NAD Abd - soft, non-distended; incision intact with staples removed and new steri-strips. Drain - serous drainage, minimal amounts Tender towards lower end of incision near drain No peritonitis   Lab Results:  Recent Labs (last 2 labs)  Recent Labs    06/03/22 0444  WBC 9.1  HGB 10.9*  HCT 33.2*  PLT 203       BMET Recent Labs (last 2 labs)       Recent Labs    06/03/22 0444 06/04/22 1004 06/05/22 0329  NA 139  --  137  K 3.7  --  3.8  CL 103  --  103  CO2 29  --  24  GLUCOSE 100* 396* 164*  BUN 12  --  6*  CREATININE 1.22*  --  1.22*  CALCIUM 8.9  --  8.9       PT/INR Recent Labs (last 2 labs)  No results for input(s): "LABPROT", "INR" in the last 72 hours.   ABG  Recent Labs (last 2 labs)  No results for input(s): "PHART", "HCO3" in the last 72 hours.   Invalid input(s):  "PCO2", "PO2"     Studies/Results: Imaging Results (Last 48 hours)  No results found.     Anti-infectives: Anti-infectives (From admission, onward)        Start     Dose/Rate Route Frequency Ordered Stop    06/03/22 2000   vancomycin (VANCOREADY) IVPB 750 mg/150 mL        750 mg 150 mL/hr over 60 Minutes Intravenous Every 24 hours 06/03/22 1814      06/03/22 1500   vancomycin (VANCOREADY) IVPB 750 mg/150 mL  Status:  Discontinued        750 mg 150 mL/hr over 60 Minutes Intravenous Every 24 hours 06/02/22 1445 06/03/22 1814    06/02/22 1445   vancomycin (VANCOREADY) IVPB 2000 mg/400 mL        2,000 mg 200 mL/hr over 120 Minutes Intravenous  Once 06/02/22 1432 06/02/22 1750    06/02/22 1445   piperacillin-tazobactam (ZOSYN) IVPB 3.375 g        3.375 g 12.5 mL/hr over 240 Minutes Intravenous Every 8 hours 06/02/22 1432               Assessment/Plan: POD# 18 S/p Open repair of reducible nonobstructed incisional hernia with mesh in myofascial advancement flaps measuring 10 cm x 10 cm on 05/18/22 by Dr. Brantley Stage  - Readmit 10/26 with diffuse body aches, fatigue, abdominal pain, nausea CT scan fairly reassuring with expected postoperative changes and no intraabdominal fluid collections.  - Continue JP drain - SS today.  Will hopefully remove soon, which might help with the abdominal tenderness near the lower end of the incision - advance to carb modified - added bowel regimen today   ID - zosyn, vancomycin -  FEN - decrease IVF, adv to carb modified VTE - SCDs, per primary/ ok for chemical dvt ppx from surgical standpoint; d/w TRH Foley - none   Dispo -Home today per primary, will remove drain prior to dc - minimal output      LOS: 3 days    Ralene Ok 06/06/2022

## 2022-06-07 LAB — CULTURE, BLOOD (SINGLE)
Culture: NO GROWTH
Special Requests: ADEQUATE

## 2022-06-08 NOTE — Progress Notes (Unsigned)
Cardiology Office Note:    Date:  06/09/2022   ID:  [name], DOB 03-28-1951, MRN [id number]  PCP:  Morgan Stains, MD  Cardiologist:  Morgan Him, MD    Referring MD: Morgan Stains, MD   Chief Complaint  Patient presents with   Coronary Artery Disease   Hypertension   Hyperlipidemia     History of Present Illness:    [name] is a 71 y.o. female with a hx of  DM2, hypothyroidism, HTN, depression, GERD, CKD stage 3 and fibromyalgia and PACs on that EKG. 2D echo showed normal LVF in 09/2017.  She has a hx of CVA and TEE showed normal LVF with mild MR and AR .  Event monitor was done showing PACs.   She was seen in the ER on 12/02/2019 with chest pain while driving her car.  She underwent coronary CTA showing coronary CTA showed a coronary Ca score of 506, mild CAD with 25-49% mRCA and moderate CAD with 50-69% sequential mRCA, 25-49% inferior sub branch of D1 and 25-49% in pLCx.  CT FFR flow analysis demonstrates no hemodynamically significant flow limiting lesions.   She has been on medical management.   She is here today for followup.  She recently underwent ventral hernia repair complicated by an infection last week and was hospitalized and treated with antibx.   She denies any chest pain or pressure, SOB, DOE, PND, orthopnea, LE edema, dizziness ( except after her surgery), palpitations or syncope. She is compliant with her meds and is tolerating meds with no SE.    Past Medical History:  Diagnosis Date   Anemia    iron deficiency   Blood transfusion    CAD (coronary artery disease), native coronary artery    coronary CTA showed a coronary Ca score of 506, mild CAD with 25-49% mRCA and moderate CAD with 50-69% sequential mRCA, 25-49% inferior sub branch of D1 and 25-49% in pLCx.     Cervical spondylosis with myelopathy and radiculopathy    CVA (cerebral vascular accident) ([place])    11/2017 - no weakness or paralysis   Depression    Diabetes mellitus    Type 1    Diastolic dysfunction    Diverticulitis    DVT (deep venous thrombosis) (HCC)    times 2 lower leg   Endometriosis    Fibromyalgia    GERD (gastroesophageal reflux disease)    occ   Hiatal hernia    History of kidney stones    Hyperlipidemia    Hypertension    Hypothyroidism    Mild aortic insufficiency    by echo 04/2013   Osteoarthritis    back and knee   Ovarian cyst    RLS (restless legs syndrome)    Thoracic compression fracture (HCC)    Villous adenoma of right colon 04/08/2016   Vitamin D deficiency disease     Past Surgical History:  Procedure Laterality Date   ANTERIOR CERVICAL DECOMP/DISCECTOMY FUSION N/A 03/23/2017   Procedure: ANTERIOR CERVICAL DECOMPRESSION/DISCECTOMY FUSION, INTERBODY PROSTHESIS,PLATE CERVICAL THREE- CERVICAL FOUR, CERVICAL FOUR- CERVICAL FIVE, CERVICAL FIVE- CERVICAL SIX;  Surgeon: Newman Pies, MD;  Location: [place];  Service: Neurosurgery;  Laterality: N/A;   ANTERIOR CERVICAL DECOMP/DISCECTOMY FUSION N/A 04/18/2019   Procedure: ANTERIOR CERVICAL DECOMPRESSION/DISCECTOMY  CERVICAL SIX- CERVICAL SEVEN;  Surgeon: Newman Pies, MD;  Location: [place];  Service: Neurosurgery;  Laterality: N/A;  anterior   APPENDECTOMY     BACK SURGERY  2005   April  2013 -  [facility] fusion[facility]   bowel blockage surgery     BREAST SURGERY     breast reduction   CARDIAC CATHETERIZATION  04/2013   normal coronary arteries and normal LVF   CARPAL TUNNEL RELEASE  06/05/2012   Procedure: CARPAL TUNNEL RELEASE;  Surgeon: Cammie Sickle., MD;  Location: [place];  Service: Orthopedics;  Laterality: Left;   CESAREAN SECTION     x 2   CHOLECYSTECTOMY     COLECTOMY  04/08/2016   DILATION AND CURETTAGE OF UTERUS     DORSAL COMPARTMENT RELEASE  06/05/2012   Procedure: RELEASE DORSAL COMPARTMENT (DEQUERVAIN);  Surgeon: Cammie Sickle., MD;  [facility];  Service: Orthopedics;  Laterality: Left;  Excision of mixoid cyst  also   EYE SURGERY     cataracts bilateral   INCISIONAL HERNIA REPAIR N/A 05/18/2022   Procedure: REPAIR INCISIONAL HERNIA WITH MESH;  Surgeon: Erroll Luna, MD;  Location: [place];  Service: General;  Laterality: N/A;   JOINT REPLACEMENT     Bilateral knee   KNEE ARTHROPLASTY  2009   lft partial   KNEE ARTHROPLASTY     rt   LAPAROSCOPIC PARTIAL COLECTOMY N/A 04/08/2016   Procedure: LAPAROSCOPIC ASSISTED ASCENDING COLECTOMY POSSIBLE OPEN COLECTOMY;  Surgeon: Fanny Skates, MD;  Location: [place];  Service: General;  Laterality: N/A;   orthopedic surgeries     multiple   perforated bowl     Sep 09 2020   REDUCTION MAMMAPLASTY Bilateral    SHOULDER OPEN ROTATOR CUFF REPAIR     rt and lft   TEE WITHOUT CARDIOVERSION N/A 11/23/2017   Procedure: TRANSESOPHAGEAL ECHOCARDIOGRAM (TEE);  Surgeon: Jerline Pain, MD;  Location: [facility] ENDOSCOPY;  Service: Cardiovascular;  Laterality: N/A;   TUBAL LIGATION     btsp    Current Medications: Current Meds  Medication Sig   acetaminophen (TYLENOL) 500 MG tablet Take 1,000 mg by mouth every 8 (eight) hours as needed for mild pain or headache.   amLODipine (NORVASC) 2.5 MG tablet Take 2 tablets (5 mg total) by mouth daily.   aspirin EC 81 MG tablet Take 81 mg by mouth daily.   cetirizine (ZYRTEC) 10 MG tablet Take 10 mg by mouth at bedtime.   Cholecalciferol (VITAMIN D3) 125 MCG (5000 UT) TABS Take 5,000 Units by mouth in the morning.   Clobetasol Propionate (TEMOVATE) 0.05 % external spray Apply 1 spray topically at bedtime as needed (scalp eczema).   escitalopram (LEXAPRO) 5 MG tablet Take 5 mg by mouth daily.   gabapentin (NEURONTIN) 300 MG capsule Take 900 mg by mouth at bedtime.   Glucagon (GVOKE HYPOPEN 1-PACK) 1 MG/0.2ML SOAJ Inject 1 mg into the skin as needed (hypoglycemia).   HUMALOG 100 UNIT/ML injection Inject 15 Units into the skin with breakfast, with lunch, and with evening meal.   ibandronate (BONIVA) 150 MG tablet Take 150 mg by  mouth every 30 (thirty) days.   imipramine (TOFRANIL) 50 MG tablet Take 1 tablet (50 mg total) by mouth daily.   insulin glargine (LANTUS) 100 UNIT/ML injection Inject 44 Units into the skin in the morning.   irbesartan-hydrochlorothiazide (AVALIDE) 300-12.5 MG tablet Take 1 tablet by mouth in the morning.   levothyroxine (SYNTHROID) 175 MCG tablet Take 175 mcg by mouth daily before breakfast.   LORazepam (ATIVAN) 0.5 MG tablet Take 0.5-1 mg by mouth every 8 (eight) hours as needed for anxiety.   Magnesium 250 MG TABS Take 250  mg by mouth in the morning.   metoprolol succinate (TOPROL-XL) 25 MG 24 hr tablet Take 1 tablet (25 mg total) by mouth daily.   oxyCODONE (ROXICODONE) 5 MG immediate release tablet Take 1 tablet (5 mg total) by mouth every 4 (four) hours as needed for severe pain.   pantoprazole (PROTONIX) 40 MG tablet Take 40 mg by mouth daily before breakfast.   Potassium 99 MG TABS Take 99 mg by mouth in the morning.   Probiotic Product (PROBIOTIC PO) Take 1 capsule by mouth in the morning.   promethazine (PHENERGAN) 25 MG tablet Take 25 mg by mouth every 8 (eight) hours as needed for nausea or vomiting.   rosuvastatin (CRESTOR) 20 MG tablet Take 20 mg by mouth in the morning.   temazepam (RESTORIL) 15 MG capsule Take 15 mg by mouth at bedtime.    tiZANidine (ZANAFLEX) 4 MG tablet Take 4 mg by mouth every 6 (six) hours as needed for muscle spasms.   vitamin B-12 (CYANOCOBALAMIN) 1000 MCG tablet Take 1,000 mcg by mouth in the morning.   zinc sulfate 220 (50 Zn) MG capsule Take 220 mg by mouth in the morning.     Allergies:   Dilaudid [hydromorphone hcl], Heparin, Ambien [zolpidem tartrate], Belsomra [suvorexant], Codeine, Morphine and related, Penicillins, Statins, Ceftin [cefuroxime axetil], Elavil [amitriptyline], Lovaza [omega-3-acid ethyl esters], Lunesta [eszopiclone], Lyrica [pregabalin], and Metformin and related   Social History   Socioeconomic History   Marital status:  Married    Spouse name: Marden Noble / Roselie Awkward   Number of children: 2   Years of education: trade school   Highest education level: Not on file  Occupational History   Occupation: retired  Tobacco Use   Smoking status: Never   Smokeless tobacco: Never  Scientific laboratory technician Use: Never used  Substance and Sexual Activity   Alcohol use: Never   Drug use: Never   Sexual activity: Not Currently    Birth control/protection: Post-menopausal, Surgical  Other Topics Concern   Not on file  Social History Narrative   Lives at home with her husband   Right handed   Caffeine: 2-3 cups of coffee/day   Social Determinants of Health   Financial Resource Strain: Not on file  Food Insecurity: No Food Insecurity (06/02/2022)   Hunger Vital Sign    Worried About Running Out of Food in the Last Year: Never true    Ran Out of Food in the Last Year: Never true  Transportation Needs: No Transportation Needs (06/02/2022)   PRAPARE - Hydrologist (Medical): No    Lack of Transportation (Non-Medical): No  Physical Activity: Not on file  Stress: Not on file  Social Connections: Not on file     Family History: The patient's family history includes Breast cancer in her cousin, cousin, maternal aunt, and maternal aunt; Breast cancer (age of onset: 57) in her mother; Dementia in her mother; Diabetes in her brother; Diabetes type II in her father and mother; Heart attack in her brother and father; High blood pressure in an other family member; Hypertension in her mother; Hypothyroidism in her brother and father; Kidney failure in her father; Lung cancer in her brother.  ROS:   Please see the history of present illness.    ROS  All other systems reviewed and negative.   EKGs/Labs/Other Studies Reviewed:    The following studies were reviewed today:  Coronary CTA 01/2020  Aorta:  Normal size.  Scattered calcifications.  No dissection.   Aortic Valve:  Trileaflet.  No  calcifications.   Mitral Valve: Marked posterior mitral annular calcifications.   Coronary Arteries:  Normal coronary origin.  Right dominance.   RCA is a large dominant artery that gives rise to PDA and PLVB. There is mild calcified plaque in the mid RCA with associated stenosis of 25-49% and sequential mid RCA lesion with moderate mixed plaque with positive remodeling with associated stenosis of 50-69%.   Left main is a large artery that gives rise to LAD and LCX arteries. There is no plaque.   LAD is a large vessel that gives rise to a large branching D1. There is minimal calcified plaque in the proximal LAD with associated stenosis of 0-24%. There is mild calcified plaque in the inferior sub branch of the Diagonal with associated stenosis of 25-49%.   LCX is a non-dominant artery that gives rise to one large OM1 branch. There is mild mixed plaque with positive remodeling in the proximal LCx with associated stenosis of 25-49%.   Other findings:   Normal pulmonary vein drainage into the left atrium.   Normal let atrial appendage without a thrombus.   Normal size of the pulmonary artery.   IMPRESSION: 1. Coronary calcium score of 506. This was 94th percentile for age and sex matched control.   2.  Normal coronary origin with right dominance.   3.  Moderate atherosclerosis.  CAD-RADS=3.   4. Consider symptom-guided anti-ischemic and preventive pharmacotherapy as well as risk factor modification per guideline-directed care.   5.  Consider functional ischemia assessment.   6.  This study has been submitted for FFR analysis.    EKG:  EKG is  ordered today and showed NSR with anterior infarct  Recent Labs: 06/02/2022: ALT 11 06/03/2022: Hemoglobin 10.9; Platelets 203 06/05/2022: BUN 6; Creatinine, Ser 1.22; Potassium 3.8; Sodium 137   Recent Lipid Panel    Component Value Date/Time   CHOL 162 03/25/2020 1038   TRIG 109 03/25/2020 1038   HDL 48 03/25/2020 1038    CHOLHDL 3.4 03/25/2020 1038   CHOLHDL 3.7 11/22/2017 0800   VLDL 32 11/22/2017 0800   LDLCALC 94 03/25/2020 1038    Physical Exam:    VS:  BP 136/72   Pulse 88   Ht '5\' 4"'$  (1.626 m)   Wt 211 lb 6.4 oz (95.9 kg)   SpO2 92%   BMI 36.29 kg/m     Wt Readings from Last 3 Encounters:  06/09/22 211 lb 6.4 oz (95.9 kg)  06/02/22 216 lb 0.8 oz (98 kg)  05/18/22 215 lb (97.5 kg)    GEN: Well nourished, well developed in no acute distress HEENT: Normal NECK: No JVD; No carotid bruits LYMPHATICS: No lymphadenopathy CARDIAC:RRR, no murmurs, rubs, gallops RESPIRATORY:  Clear to auscultation without rales, wheezing or rhonchi  ABDOMEN: Soft, non-tender, non-distended MUSCULOSKELETAL:  No edema; No deformity  SKIN: Warm and dry NEUROLOGIC:  Alert and oriented x 3 PSYCHIATRIC:  Normal affect  ASSESSMENT:    1. Coronary artery disease involving native coronary artery of native heart without angina pectoris   2. Primary hypertension   3. Stage 3a chronic kidney disease ([place])   4. Pure hypercholesterolemia   5. PAC (premature atrial contraction)    PLAN:    In order of problems listed above:  1.  ASCAD -atypical chest pain -coronary CTA showed a coronary Ca score of 506, mild CAD with 25-49% mRCA and moderate CAD with 50-69% sequential mRCA, 25-49% inferior  sub branch of D1 and 25-49% in pLCx.   -FFR flow analysis showed no flow limiting lesions -She denies any anginal symptoms -continue therapy with aspirin 81 mg daily with statins  2.  HTN -BP is adequately controlled on exam today -Continue prescription drug management with amlodipine 2.5 mg daily, irbesartan/HCT 300-12.5 mg daily, Toprol-XL 25 mg daily with as needed refills -I have personally reviewed and interpreted outside labs performed by patient's PCP which showed SCr 1.22 and K+ 3.8 on 06/05/22  3.  DM with QIW9N -followed by PCP  4.  HLD -LDL goal < 70 -I have personally reviewed and interpreted outside labs  performed by patient's PCP which showed LDL 38 and HDL 38 on 03/01/22 -Continue prescription drug management with Crestor 20 mg daily with as needed refills  5.  PACs -controlled on Toprol   Medication Adjustments/Labs and Tests Ordered: Current medicines are reviewed at length with the patient today.  Concerns regarding medicines are outlined above.  No orders of the defined types were placed in this encounter.   No orders of the defined types were placed in this encounter.   Signed, Morgan Him, MD  06/09/2022 10:21 AM    [place]

## 2022-06-09 ENCOUNTER — Encounter: Payer: Self-pay | Admitting: Cardiology

## 2022-06-09 ENCOUNTER — Ambulatory Visit: Payer: Medicare Other | Attending: Cardiology | Admitting: Cardiology

## 2022-06-09 VITALS — BP 136/72 | HR 88 | Ht 64.0 in | Wt 211.4 lb

## 2022-06-09 DIAGNOSIS — I2510 Atherosclerotic heart disease of native coronary artery without angina pectoris: Secondary | ICD-10-CM

## 2022-06-09 DIAGNOSIS — N1831 Chronic kidney disease, stage 3a: Secondary | ICD-10-CM | POA: Diagnosis not present

## 2022-06-09 DIAGNOSIS — I491 Atrial premature depolarization: Secondary | ICD-10-CM

## 2022-06-09 DIAGNOSIS — I10 Essential (primary) hypertension: Secondary | ICD-10-CM | POA: Diagnosis not present

## 2022-06-09 DIAGNOSIS — E7800 Pure hypercholesterolemia, unspecified: Secondary | ICD-10-CM | POA: Diagnosis not present

## 2022-06-09 NOTE — Patient Instructions (Signed)
Medication Instructions:  Your physician recommends that you continue on your current medications as directed. Please refer to the Current Medication list given to you today.  *If you need a refill on your cardiac medications before your next appointment, please call your pharmacy*  Follow-Up: At [facility], you and your health needs are our priority.  As part of our continuing mission to provide you with exceptional heart care, we have created designated Provider Care Teams.  These Care Teams include your primary Cardiologist (physician) and Advanced Practice Providers (APPs -  Physician Assistants and Nurse Practitioners) who all work together to provide you with the care you need, when you need it.  Your next appointment:   1 year(s)  The format for your next appointment:   In Person  Provider:   Traci Turner, MD     Important Information About Sugar       

## 2022-06-13 ENCOUNTER — Other Ambulatory Visit: Payer: Self-pay | Admitting: Cardiology

## 2022-09-01 ENCOUNTER — Emergency Department (HOSPITAL_BASED_OUTPATIENT_CLINIC_OR_DEPARTMENT_OTHER): Payer: Medicare Other

## 2022-09-01 ENCOUNTER — Encounter (HOSPITAL_BASED_OUTPATIENT_CLINIC_OR_DEPARTMENT_OTHER): Payer: Self-pay

## 2022-09-01 ENCOUNTER — Emergency Department (HOSPITAL_BASED_OUTPATIENT_CLINIC_OR_DEPARTMENT_OTHER)
Admission: EM | Admit: 2022-09-01 | Discharge: 2022-09-01 | Disposition: A | Discharge status: Home / Self Care | Payer: Medicare Other | Attending: Emergency Medicine | Admitting: Emergency Medicine

## 2022-09-01 ENCOUNTER — Other Ambulatory Visit: Payer: Self-pay

## 2022-09-01 ENCOUNTER — Emergency Department (HOSPITAL_BASED_OUTPATIENT_CLINIC_OR_DEPARTMENT_OTHER): Payer: Medicare Other | Admitting: Radiology

## 2022-09-01 DIAGNOSIS — R200 Anesthesia of skin: Secondary | ICD-10-CM | POA: Insufficient documentation

## 2022-09-01 DIAGNOSIS — E039 Hypothyroidism, unspecified: Secondary | ICD-10-CM | POA: Diagnosis not present

## 2022-09-01 DIAGNOSIS — Z7984 Long term (current) use of oral hypoglycemic drugs: Secondary | ICD-10-CM | POA: Insufficient documentation

## 2022-09-01 DIAGNOSIS — Z79899 Other long term (current) drug therapy: Secondary | ICD-10-CM | POA: Diagnosis not present

## 2022-09-01 DIAGNOSIS — R519 Headache, unspecified: Secondary | ICD-10-CM | POA: Diagnosis not present

## 2022-09-01 DIAGNOSIS — E119 Type 2 diabetes mellitus without complications: Secondary | ICD-10-CM | POA: Diagnosis not present

## 2022-09-01 DIAGNOSIS — M5412 Radiculopathy, cervical region: Secondary | ICD-10-CM | POA: Diagnosis not present

## 2022-09-01 DIAGNOSIS — Z794 Long term (current) use of insulin: Secondary | ICD-10-CM | POA: Insufficient documentation

## 2022-09-01 DIAGNOSIS — Z7982 Long term (current) use of aspirin: Secondary | ICD-10-CM | POA: Insufficient documentation

## 2022-09-01 DIAGNOSIS — I10 Essential (primary) hypertension: Secondary | ICD-10-CM | POA: Insufficient documentation

## 2022-09-01 LAB — BASIC METABOLIC PANEL
Anion gap: 8 (ref 5–15)
BUN: 19 mg/dL (ref 8–23)
CO2: 28 mmol/L (ref 22–32)
Calcium: 9.6 mg/dL (ref 8.9–10.3)
Chloride: 103 mmol/L (ref 98–111)
Creatinine, Ser: 1.19 mg/dL — ABNORMAL HIGH (ref 0.44–1.00)
GFR, Estimated: 49 mL/min — ABNORMAL LOW (ref >60)
Glucose, Bld: 96 mg/dL (ref 70–99)
Potassium: 4.1 mmol/L (ref 3.5–5.1)
Sodium: 139 mmol/L (ref 135–145)

## 2022-09-01 LAB — CBC
HCT: 39.5 % (ref 36.0–46.0)
Hemoglobin: 13 g/dL (ref 12.0–15.0)
MCH: 26.5 pg (ref 26.0–34.0)
MCHC: 32.9 g/dL (ref 30.0–36.0)
MCV: 80.4 fL (ref 80.0–100.0)
Platelets: 229 10*3/uL (ref 150–400)
RBC: 4.91 MIL/uL (ref 3.87–5.11)
RDW: 14.5 % (ref 11.5–15.5)
WBC: 5.1 10*3/uL (ref 4.0–10.5)
nRBC: 0 % (ref 0.0–0.2)

## 2022-09-01 LAB — TROPONIN I (HIGH SENSITIVITY)
Troponin I (High Sensitivity): 4 ng/L (ref <18)
Troponin I (High Sensitivity): 4 ng/L (ref <18)

## 2022-09-01 MED ORDER — ONDANSETRON HCL 4 MG/2ML IJ SOLN
4.0000 mg | Freq: Once | INTRAMUSCULAR | Status: AC
  Administered 2022-09-01: 4 mg via INTRAVENOUS
  Filled 2022-09-01: qty 2

## 2022-09-01 MED ORDER — FENTANYL CITRATE PF 50 MCG/ML IJ SOSY
12.5000 ug | PREFILLED_SYRINGE | Freq: Once | INTRAMUSCULAR | Status: AC
  Administered 2022-09-01: 12.5 ug via INTRAVENOUS
  Filled 2022-09-01: qty 1

## 2022-09-01 NOTE — ED Provider Triage Note (Signed)
Emergency Medicine Provider Triage Evaluation Note  [name] , a 72 y.o. female  was evaluated in triage.  Pt complains of left shoulder and upper arm pain.  She has a history of pinched nerves in her neck and back.  She has chronic pain medication for this.  Over the past 2 days she has had worse pain, worse with movement in these areas as well as numbness in the left arm.  States that pain is similar to pain for her nerve pain, but worse.  Review of Systems  Positive: Shoulder pain Negative: Shortness of breath  Physical Exam  BP (!) 154/79 (BP Location: Left Arm)   Pulse 97   Temp 97.8 F (36.6 C)   Resp 16   Ht '5\' 4"'$  (1.626 m)   Wt 95.9 kg   SpO2 100%   BMI 36.29 kg/m  Gen:   Awake, no distress   Resp:  Normal effort  MSK:   Moves extremities without difficulty  Other:  Tenderness over the left upper chest wall and anterior shoulder  Medical Decision Making  Medically screening exam initiated at 5:59 PM.  Appropriate orders placed.  [name] was informed that the remainder of the evaluation will be completed by another provider, this initial triage assessment does not replace that evaluation, and the importance of remaining in the ED until their evaluation is complete.     Morgan Cater, PA-C 09/01/22 1800

## 2022-09-01 NOTE — Discharge Instructions (Signed)
CT head and neck without any acute findings.  Continue your muscle relaxer.  Give Dr. Arnoldo Morale your neurosurgeon on-call symptoms could be secondary to pinched nerve in the neck.  Cardiac workup without any acute findings which is reassuring.  Return for any new or worse symptoms.  Also follow-up with your primary care doctor.  Symptoms do not seem to be stroke related since there is pain associated with it.

## 2022-09-01 NOTE — ED Triage Notes (Signed)
Patient here POV from Home.  Endorses Left Sided Numbness and Pain (Arm/Face) that began approximately 2 Days ago.   Pain to Left Upper Chest (Beneath Shoulder). No Weakness.   Uncomfortable during Triage. A&Ox4. GCS 15. Ambulatory.

## 2022-09-01 NOTE — ED Provider Notes (Addendum)
[facility] Provider Note   CSN: [id number] Arrival date & time: 09/01/22  1751     History  Chief Complaint  Patient presents with   Numbness    [name] is a 72 y.o. female.  Patient since Sunday with a complaint pain left anterior chest radiates down the arm also pain to the left side of her face there is numbness in both areas.  And headache.  Everything started at the same time.  Patient is followed by Dr. Arnoldo Morale from neurosurgery.  And she was felt to have may be some cervical radiculopathy pain in December and was treated with a muscle relaxer which helped significantly.  No nausea no vomiting.  No weakness.  Right upper extremity completely normal lower extremities normal.  No visual changes no speech problems.  But patient does state that her tongue is numb.  Past medical history significant for hypertension hypothyroidism fibromyalgia history of deep vein thrombosis lower extremities diverticulitis hyperlipidemia restless leg syndrome mild aortic insufficiency diabetes CVA in 2019 but no weakness or paralysis coronary artery disease.  Surgical history significant for appendectomy cholecystectomy partial colectomy in 2017 anterior cervical decompression discectomy in 2018 anterior cervical decompression discectomy in 2020 cardiac catheterization in 2014.  Patient never used tobacco products.       Home Medications Prior to Admission medications   Medication Sig Start Date End Date Taking? Authorizing Provider  acetaminophen (TYLENOL) 500 MG tablet Take 1,000 mg by mouth every 8 (eight) hours as needed for mild pain or headache.    [provider]  amLODipine (NORVASC) 2.5 MG tablet Take 2 tablets (5 mg total) by mouth daily. 05/25/21   Sueanne Margarita, MD  aspirin EC 81 MG tablet Take 81 mg by mouth daily.    [provider]  cetirizine (ZYRTEC) 10 MG tablet Take 10 mg by mouth at bedtime.    [provider]  Cholecalciferol (VITAMIN D3) 125 MCG (5000 UT) TABS Take 5,000 Units by mouth in the morning.    [provider]  Clobetasol Propionate (TEMOVATE) 0.05 % external spray Apply 1 spray topically at bedtime as needed (scalp eczema).    [provider]  escitalopram (LEXAPRO) 5 MG tablet Take 5 mg by mouth daily. 03/24/22   [provider]  gabapentin (NEURONTIN) 300 MG capsule Take 900 mg by mouth at bedtime.    [provider]  Glucagon (GVOKE HYPOPEN 1-PACK) 1 MG/0.2ML SOAJ Inject 1 mg into the skin as needed (hypoglycemia).    [provider]  HUMALOG 100 UNIT/ML injection Inject 15 Units into the skin with breakfast, with lunch, and with evening meal. 02/03/22   [provider]  ibandronate (BONIVA) 150 MG tablet Take 150 mg by mouth every 30 (thirty) days. 03/06/19   [provider]  imipramine (TOFRANIL) 50 MG tablet Take 1 tablet (50 mg total) by mouth daily. 01/11/22   Lomax, Amy, NP  insulin glargine (LANTUS) 100 UNIT/ML injection Inject 44 Units into the skin in the morning.    [provider]  irbesartan-hydrochlorothiazide (AVALIDE) 300-12.5 MG tablet Take 1 tablet by mouth in the morning.    [provider]  levothyroxine (SYNTHROID) 175 MCG tablet Take 175 mcg by mouth daily before breakfast. 10/10/17   [provider]  LORazepam (ATIVAN) 0.5 MG tablet Take 0.5-1 mg by mouth every 8 (eight) hours as needed for anxiety. 03/15/19   [provider]  Magnesium 250 MG TABS  Take 250 mg by mouth in the morning.    [provider]  metoprolol succinate (TOPROL-XL) 25 MG 24 hr tablet Take 1 tablet (25 mg total) by mouth daily. 05/25/21   Sueanne Margarita, MD  oxyCODONE (ROXICODONE) 5 MG immediate release tablet Take 1 tablet (5 mg total) by mouth every 4 (four) hours as needed for severe pain. 05/20/22   Cornett, Marcello Moores, MD  pantoprazole (PROTONIX) 40 MG tablet Take 40 mg by mouth daily  before breakfast. 07/03/19   [provider]  Potassium 99 MG TABS Take 99 mg by mouth in the morning.    [provider]  Probiotic Product (PROBIOTIC PO) Take 1 capsule by mouth in the morning.    [provider]  promethazine (PHENERGAN) 25 MG tablet Take 25 mg by mouth every 8 (eight) hours as needed for nausea or vomiting.    [provider]  rosuvastatin (CRESTOR) 20 MG tablet Take 20 mg by mouth in the morning.    [provider]  temazepam (RESTORIL) 15 MG capsule Take 15 mg by mouth at bedtime.  10/10/17   [provider]  tiZANidine (ZANAFLEX) 4 MG tablet Take 4 mg by mouth every 6 (six) hours as needed for muscle spasms. 04/19/22   [provider]  vitamin B-12 (CYANOCOBALAMIN) 1000 MCG tablet Take 1,000 mcg by mouth in the morning.    [provider]  zinc sulfate 220 (50 Zn) MG capsule Take 220 mg by mouth in the morning.    [provider]      Allergies    Dilaudid [hydromorphone hcl], Heparin, Ambien [zolpidem tartrate], Belsomra [suvorexant], Codeine, Morphine and related, Penicillins, Statins, Ceftin [cefuroxime axetil], Elavil [amitriptyline], Lovaza [omega-3-acid ethyl esters], Lunesta [eszopiclone], Lyrica [pregabalin], and Metformin and related    Review of Systems   Review of Systems  Constitutional:  Negative for chills and fever.  HENT:  Negative for ear pain and sore throat.   Eyes:  Negative for pain and visual disturbance.  Respiratory:  Negative for cough and shortness of breath.   Cardiovascular:  Positive for chest pain. Negative for palpitations.  Gastrointestinal:  Negative for abdominal pain and vomiting.  Genitourinary:  Negative for dysuria and hematuria.  Musculoskeletal:  Positive for neck pain. Negative for arthralgias and back pain.  Skin:  Negative for color change and rash.  Neurological:  Positive for numbness and headaches. Negative for seizures, syncope, facial  asymmetry, speech difficulty and weakness.  All other systems reviewed and are negative.   Physical Exam Updated Vital Signs BP (!) 148/76   Pulse 81   Temp 97.8 F (36.6 C)   Resp 12   Ht 1.626 m ('5\' 4"'$ )   Wt 95.9 kg   SpO2 95%   BMI 36.29 kg/m  Physical Exam Vitals and nursing note reviewed.  Constitutional:      General: She is not in acute distress.    Appearance: Normal appearance. She is well-developed.  HENT:     Head: Normocephalic and atraumatic.  Eyes:     Extraocular Movements: Extraocular movements intact.     Conjunctiva/sclera: Conjunctivae normal.     Pupils: Pupils are equal, round, and reactive to light.  Cardiovascular:     Rate and Rhythm: Normal rate and regular rhythm.     Heart sounds: No murmur heard. Pulmonary:     Effort: Pulmonary effort is normal. No respiratory distress.     Breath sounds: Normal breath sounds.  Abdominal:  Palpations: Abdomen is soft.     Tenderness: There is no abdominal tenderness.  Musculoskeletal:        General: No swelling.     Cervical back: Normal range of motion and neck supple. No tenderness.  Skin:    General: Skin is warm and dry.     Capillary Refill: Capillary refill takes less than 2 seconds.  Neurological:     Mental Status: She is alert.     Cranial Nerves: Cranial nerve deficit present.     Sensory: Sensory deficit present.  Psychiatric:        Mood and Affect: Mood normal.     ED Results / Procedures / Treatments   Labs (all labs ordered are listed, but only abnormal results are displayed) Labs Reviewed  BASIC METABOLIC PANEL - Abnormal; Notable for the following components:      Result Value   Creatinine, Ser 1.19 (*)    GFR, Estimated 49 (*)    All other components within normal limits  CBC  TROPONIN I (HIGH SENSITIVITY)  TROPONIN I (HIGH SENSITIVITY)    EKG EKG Interpretation  Date/Time:  Thursday September 01 2022 18:00:55 EST Ventricular Rate:  94 PR Interval:  190 QRS  Duration: 120 QT Interval:  380 QTC Calculation: 475 R Axis:   -41 Text Interpretation: Normal sinus rhythm Left axis deviation Right bundle branch block Lateral infarct , age undetermined Abnormal ECG When compared with ECG of 23-Mar-2022 16:15, Sinus rhythm has replaced Atrial flutter Vent. rate has decreased BY  54 BPM Right bundle branch block is now Present Lateral infarct is now Present Confirmed by Fredia Sorrow [phone]) on 09/01/2022 8:12:03 PM  Radiology DG Chest 2 View  Result Date: 09/01/2022 CLINICAL DATA:  Chest pain EXAM: CHEST - 2 VIEW COMPARISON:  06/02/2022 FINDINGS: The heart size and mediastinal contours are within normal limits. Both lungs are clear. Postsurgical changes at the lower cervical spine and lumbar spine. Chronic lower thoracic vertebral wedging. Metallic artifact over the anterior upper chest wall. IMPRESSION: No active cardiopulmonary disease. Electronically Signed   By: Donavan Foil M.D.   On: 09/01/2022 18:30    Procedures Procedures    Medications Ordered in ED Medications  fentaNYL (SUBLIMAZE) injection 12.5 mcg (12.5 mcg Intravenous Given 09/01/22 2205)  ondansetron (ZOFRAN) injection 4 mg (4 mg Intravenous Given 09/01/22 2205)    ED Course/ Medical Decision Making/ A&P                             Medical Decision Making Amount and/or Complexity of Data Reviewed Radiology: ordered.  Risk Prescription drug management.   Patient's workup from triage was more geared towards ruling out cardiac chest pain.  Opponents x 2 without any significant abnormalities.  EKG did raise some concerns for lateral ischemia.  But with the troponins being negative x 2 probably not significant.  Patient metabolic panel creatinine 1.19 otherwise no electrolyte abnormalities blood sugar was 96.  CBC no white blood cell count elevation it was 5.1 hemoglobin 13.0 and chest x-ray no active cardiopulmonary disease.  Patient really wants to go home I did talk her into doing  CT head.  Certainly she is got a history of some cervical radicular pain.  But it does not explain the numbness to the left side of the face and since headache and numbness came on together I think a head CT will be fairly helpful plus the symptoms have been present  since Sunday.  Which is 5 days ago.  If negative do not feel that she needs an acute MRI.  But head CT mostly to rule out head bleed.  Patient does have muscle relaxers to take at home.  Will treat her here with some pain medicine and antinausea medicine and since she is also having neck pain we will get CT of neck along with it.  CT head without any evidence of any head bleed or any acute abnormalities.  The symptoms have been ongoing since Sunday.  CT neck without any acute abnormalities.  Difficult to explain all the symptoms but it is possible that there could be is cervical radiculopathy certainly explaining some of the numbness to the left arm.  Does not really explain the numbness to the left side of the face.  But since symptoms been going on since Sunday and there is no evidence of head bleed most likely there is not an acute CVA since there is pain associated with this.  But if symptoms persist may need MRI brain.  May need MRI cervical spine.  Patient will follow-up with Dr. Arnoldo Morale her neurosurgeon regarding the recurrent neck pain.  And will have patient follow back up with her primary care provider.  Patient will return for any new or worse symptoms   Final Clinical Impression(s) / ED Diagnoses Final diagnoses:  Acute intractable headache, unspecified headache type  Left facial numbness  Cervical radicular pain    Rx / DC Orders ED Discharge Orders     None         Fredia Sorrow, MD 09/01/22 2206    Fredia Sorrow, MD 09/01/22 2320

## 2022-09-12 ENCOUNTER — Other Ambulatory Visit: Payer: Self-pay | Admitting: Cardiology

## 2022-09-29 ENCOUNTER — Other Ambulatory Visit: Payer: Self-pay | Admitting: Family Medicine

## 2022-09-29 DIAGNOSIS — M5412 Radiculopathy, cervical region: Secondary | ICD-10-CM

## 2022-09-29 DIAGNOSIS — R200 Anesthesia of skin: Secondary | ICD-10-CM

## 2022-10-11 ENCOUNTER — Ambulatory Visit
Admission: RE | Admit: 2022-10-11 | Discharge: 2022-10-11 | Disposition: A | Discharge status: Home / Self Care | Payer: Medicare Other | Source: Ambulatory Visit | Attending: Family Medicine | Admitting: Family Medicine

## 2022-10-11 ENCOUNTER — Encounter: Payer: Self-pay | Admitting: Family Medicine

## 2022-10-11 DIAGNOSIS — R200 Anesthesia of skin: Secondary | ICD-10-CM

## 2022-10-11 DIAGNOSIS — M5412 Radiculopathy, cervical region: Secondary | ICD-10-CM

## 2022-10-11 MED ORDER — GADOPICLENOL 0.5 MMOL/ML IV SOLN
10.0000 mL | Freq: Once | INTRAVENOUS | Status: AC | PRN
  Administered 2022-10-11: 10 mL via INTRAVENOUS

## 2022-12-18 ENCOUNTER — Emergency Department (HOSPITAL_BASED_OUTPATIENT_CLINIC_OR_DEPARTMENT_OTHER): Payer: Medicare Other

## 2022-12-18 ENCOUNTER — Other Ambulatory Visit: Payer: Self-pay

## 2022-12-18 ENCOUNTER — Encounter (HOSPITAL_BASED_OUTPATIENT_CLINIC_OR_DEPARTMENT_OTHER): Payer: Self-pay | Admitting: Emergency Medicine

## 2022-12-18 ENCOUNTER — Emergency Department (HOSPITAL_BASED_OUTPATIENT_CLINIC_OR_DEPARTMENT_OTHER)
Admission: EM | Admit: 2022-12-18 | Discharge: 2022-12-18 | Disposition: A | Discharge status: Home / Self Care | Payer: Medicare Other | Attending: Emergency Medicine | Admitting: Emergency Medicine

## 2022-12-18 DIAGNOSIS — Z79899 Other long term (current) drug therapy: Secondary | ICD-10-CM | POA: Diagnosis not present

## 2022-12-18 DIAGNOSIS — M79621 Pain in right upper arm: Secondary | ICD-10-CM | POA: Diagnosis present

## 2022-12-18 DIAGNOSIS — E119 Type 2 diabetes mellitus without complications: Secondary | ICD-10-CM | POA: Diagnosis not present

## 2022-12-18 DIAGNOSIS — Z794 Long term (current) use of insulin: Secondary | ICD-10-CM | POA: Diagnosis not present

## 2022-12-18 DIAGNOSIS — Z7982 Long term (current) use of aspirin: Secondary | ICD-10-CM | POA: Diagnosis not present

## 2022-12-18 DIAGNOSIS — I2510 Atherosclerotic heart disease of native coronary artery without angina pectoris: Secondary | ICD-10-CM | POA: Diagnosis not present

## 2022-12-18 DIAGNOSIS — R202 Paresthesia of skin: Secondary | ICD-10-CM | POA: Diagnosis not present

## 2022-12-18 DIAGNOSIS — I10 Essential (primary) hypertension: Secondary | ICD-10-CM | POA: Insufficient documentation

## 2022-12-18 DIAGNOSIS — R0602 Shortness of breath: Secondary | ICD-10-CM | POA: Diagnosis not present

## 2022-12-18 DIAGNOSIS — M79601 Pain in right arm: Secondary | ICD-10-CM

## 2022-12-18 LAB — CBC
HCT: 39.8 % (ref 36.0–46.0)
Hemoglobin: 12.9 g/dL (ref 12.0–15.0)
MCH: 27 pg (ref 26.0–34.0)
MCHC: 32.4 g/dL (ref 30.0–36.0)
MCV: 83.3 fL (ref 80.0–100.0)
Platelets: 241 10*3/uL (ref 150–400)
RBC: 4.78 MIL/uL (ref 3.87–5.11)
RDW: 13.1 % (ref 11.5–15.5)
WBC: 8.1 10*3/uL (ref 4.0–10.5)
nRBC: 0 % (ref 0.0–0.2)

## 2022-12-18 LAB — BASIC METABOLIC PANEL
Anion gap: 10 (ref 5–15)
BUN: 22 mg/dL (ref 8–23)
CO2: 25 mmol/L (ref 22–32)
Calcium: 9.1 mg/dL (ref 8.9–10.3)
Chloride: 101 mmol/L (ref 98–111)
Creatinine, Ser: 1.68 mg/dL — ABNORMAL HIGH (ref 0.44–1.00)
GFR, Estimated: 32 mL/min — ABNORMAL LOW (ref >60)
Glucose, Bld: 168 mg/dL — ABNORMAL HIGH (ref 70–99)
Potassium: 4.1 mmol/L (ref 3.5–5.1)
Sodium: 136 mmol/L (ref 135–145)

## 2022-12-18 LAB — CBG MONITORING, ED: Glucose-Capillary: 191 mg/dL — ABNORMAL HIGH (ref 70–99)

## 2022-12-18 LAB — TROPONIN I (HIGH SENSITIVITY)
Troponin I (High Sensitivity): 5 ng/L (ref <18)
Troponin I (High Sensitivity): 5 ng/L (ref <18)

## 2022-12-18 NOTE — ED Triage Notes (Signed)
Pt has had right arm numbness/tingling for few week.MD aware, no new meds given. Today patient had sudden sharp pain in right arm/forearm feels like a vice grip. Patient is short of breath ,says its because she is in pain and scared.

## 2022-12-18 NOTE — ED Provider Notes (Signed)
[place] EMERGENCY DEPARTMENT AT [facility] Provider Note   CSN: [id number] Arrival date & time: 12/18/22  1808     History  Chief Complaint  Patient presents with   Arm Pain   HPI [name] is a 72 y.o. female with CAD, hypertension, diabetes presenting for right arm numbness and pain. States numbness in the right arm has been going on for "several months".  But this afternoon she had a sudden sharp pain in her right forearm that felt like a "vice grip".  The pain has improved significantly.  States she is scheduled to see "a specialist" to assess her persistent numbness in her upper extremities.  Denies chest pain and shortness of breath at this time but does say she was mildly short of breath when the pain started earlier today.  She thinks it was because she was "scared".   Arm Pain       Home Medications Prior to Admission medications   Medication Sig Start Date End Date Taking? Authorizing Provider  acetaminophen (TYLENOL) 500 MG tablet Take 1,000 mg by mouth every 8 (eight) hours as needed for mild pain or headache.    [provider]  amLODipine (NORVASC) 2.5 MG tablet Take 2 tablets (5 mg total) by mouth daily. 05/25/21   Quintella Reichert, MD  aspirin EC 81 MG tablet Take 81 mg by mouth daily.    [provider]  cetirizine (ZYRTEC) 10 MG tablet Take 10 mg by mouth at bedtime.    [provider]  Cholecalciferol (VITAMIN D3) 125 MCG (5000 UT) TABS Take 5,000 Units by mouth in the morning.    [provider]  Clobetasol Propionate (TEMOVATE) 0.05 % external spray Apply 1 spray topically at bedtime as needed (scalp eczema).    [provider]  escitalopram (LEXAPRO) 5 MG tablet Take 5 mg by mouth daily. 03/24/22   [provider]  gabapentin (NEURONTIN) 300 MG capsule Take 900 mg by mouth at bedtime.    [provider]  Glucagon (GVOKE HYPOPEN 1-PACK) 1 MG/0.2ML SOAJ Inject 1 mg into the skin as  needed (hypoglycemia).    [provider]  HUMALOG 100 UNIT/ML injection Inject 15 Units into the skin with breakfast, with lunch, and with evening meal. 02/03/22   [provider]  ibandronate (BONIVA) 150 MG tablet Take 150 mg by mouth every 30 (thirty) days. 03/06/19   [provider]  imipramine (TOFRANIL) 50 MG tablet Take 1 tablet (50 mg total) by mouth daily. 01/11/22   Lomax, Amy, NP  insulin glargine (LANTUS) 100 UNIT/ML injection Inject 44 Units into the skin in the morning.    [provider]  irbesartan-hydrochlorothiazide (AVALIDE) 300-12.5 MG tablet Take 1 tablet by mouth in the morning.    [provider]  levothyroxine (SYNTHROID) 175 MCG tablet Take 175 mcg by mouth daily before breakfast. 10/10/17   [provider]  LORazepam (ATIVAN) 0.5 MG tablet Take 0.5-1 mg by mouth every 8 (eight) hours as needed for anxiety. 03/15/19   [provider]  Magnesium 250 MG TABS Take 250 mg by mouth in the morning.    [provider]  metoprolol succinate (TOPROL-XL) 25 MG 24 hr tablet TAKE 1 TABLET (25 MG TOTAL) BY MOUTH DAILY. 09/13/22   Quintella Reichert, MD  oxyCODONE (ROXICODONE) 5 MG immediate release tablet Take 1 tablet (5 mg total) by mouth every 4 (four) hours as needed for severe pain. 05/20/22   Cornett, Maisie Fus,  MD  pantoprazole (PROTONIX) 40 MG tablet Take 40 mg by mouth daily before breakfast. 07/03/19   [provider]  Potassium 99 MG TABS Take 99 mg by mouth in the morning.    [provider]  Probiotic Product (PROBIOTIC PO) Take 1 capsule by mouth in the morning.    [provider]  promethazine (PHENERGAN) 25 MG tablet Take 25 mg by mouth every 8 (eight) hours as needed for nausea or vomiting.    [provider]  rosuvastatin (CRESTOR) 20 MG tablet Take 20 mg by mouth in the morning.    [provider]  temazepam (RESTORIL) 15 MG capsule Take 15 mg by mouth at bedtime.   10/10/17   [provider]  tiZANidine (ZANAFLEX) 4 MG tablet Take 4 mg by mouth every 6 (six) hours as needed for muscle spasms. 04/19/22   [provider]  vitamin B-12 (CYANOCOBALAMIN) 1000 MCG tablet Take 1,000 mcg by mouth in the morning.    [provider]  zinc sulfate 220 (50 Zn) MG capsule Take 220 mg by mouth in the morning.    [provider]      Allergies    Dilaudid [hydromorphone hcl], Heparin, Ambien [zolpidem tartrate], Belsomra [suvorexant], Codeine, Morphine and related, Penicillins, Statins, Ceftin [cefuroxime axetil], Elavil [amitriptyline], Lovaza [omega-3-acid ethyl esters], Lunesta [eszopiclone], Lyrica [pregabalin], and Metformin and related    Review of Systems   See HPI for pertinent positives   Physical Exam   Vitals:   12/18/22 1821 12/18/22 2030  BP: 126/60 136/71  Pulse: 71 69  Resp: 19 (!) 21  Temp: 98.6 F (37 C)   SpO2: 100% 95%    CONSTITUTIONAL:  well-appearing, NAD NEURO:  GCS 15. Speech is goal oriented. No deficits appreciated to CN III-XII; symmetric eyebrow raise, no facial drooping, tongue midline. Patient has equal grip strength bilaterally with 5/5 strength against resistance in all major muscle groups bilaterally. Sensation to light touch intact. Patient moves extremities without ataxia. Normal finger-nose-finger. Patient ambulatory with steady gait.  EYES:  eyes equal and reactive ENT/NECK:  Supple, no stridor  CARDIO:  Regular rate and rhythm, appears well-perfused  PULM:  No respiratory distress, CTAB GI/GU:  non-distended, soft MSK/SPINE:  No gross deformities, no edema, moves all extremities  SKIN:  no rash, atraumatic   *Additional and/or pertinent findings included in MDM below    ED Results / Procedures / Treatments   Labs (all labs ordered are listed, but only abnormal results are displayed) Labs Reviewed  BASIC METABOLIC PANEL - Abnormal; Notable for the following components:       Result Value   Glucose, Bld 168 (*)    Creatinine, Ser 1.68 (*)    GFR, Estimated 32 (*)    All other components within normal limits  CBG MONITORING, ED - Abnormal; Notable for the following components:   Glucose-Capillary 191 (*)    All other components within normal limits  CBC  TROPONIN I (HIGH SENSITIVITY)  TROPONIN I (HIGH SENSITIVITY)    EKG EKG Interpretation  Date/Time:  Sunday Dec 18 2022 18:19:08 EDT Ventricular Rate:  71 PR Interval:  230 QRS Duration: 139 QT Interval:  433 QTC Calculation: 471 R Axis:   -14 Text Interpretation: Sinus rhythm Prolonged PR interval IVCD, consider atypical RBBB Confirmed by Linwood Dibbles [phone]) on 12/18/2022 6:21:17 PM  Radiology DG Chest Port 1 View  Result Date: 12/18/2022 CLINICAL DATA:  Right arm numbness and tingling for a week. EXAM: PORTABLE  CHEST 1 VIEW COMPARISON:  Chest radiograph 09/01/2022 06/02/2022 FINDINGS: The heart size and mediastinal contours are within normal limits. Both lungs are clear. No pleural effusion or pneumothorax. Multilevel degenerative changes in the mid and distal thoracic spine. Anterior cervical spinal fixation. IMPRESSION: No active disease. Electronically Signed   By: Sherron Ales M.D.   On: 12/18/2022 19:29    Procedures Procedures    Medications Ordered in ED Medications - No data to display  ED Course/ Medical Decision Making/ A&P                             Medical Decision Making Amount and/or Complexity of Data Reviewed Labs: ordered. Radiology: ordered.   Initial Impression and Ddx 72 year old well-appearing female presenting for numbness and pain right arm.  Exam is unremarkable.  DDx includes stroke, ICH, cervical radiculopathy, lower motor neuron injury, ACS. Patient PMH that increases complexity of ED encounter:  CAD, hypertension, diabetes  Interpretation of Diagnostics I independent reviewed and interpreted the labs as followed: Elevated creatinine, hyperglycemia  - I  independently visualized the following imaging with scope of interpretation limited to determining acute life threatening conditions related to emergency care: cxr, which revealed no acute disease  -I personally reviewed and interpreted EKG which revealed normal sinus rhythm  Patient Reassessment and Ultimate Disposition/Management Suspicion for stroke or ICH was low given how long symptoms have been going on.  Did suspect ACS but fortunately workup was reassuring.  Patient also stated that her pain and numbness had improved during encounter.  Advised to follow-up with her neurologist for these ongoing symptoms.  Discussed pertinent return precautions vital stable discharge.  Patient management required discussion with the following services or consulting groups:  None  Complexity of Problems Addressed Acute complicated illness or Injury  Additional Data Reviewed and Analyzed Further history obtained from: Further history from spouse/family member, Past medical history and medications listed in the EMR, and Prior ED visit notes  Patient Encounter Risk Assessment None         Final Clinical Impression(s) / ED Diagnoses Final diagnoses:  Pain of right upper extremity    Rx / DC Orders ED Discharge Orders     None         Gareth Eagle, PA-C 12/18/22 2203    Linwood Dibbles, MD 12/19/22 1606

## 2022-12-18 NOTE — Discharge Instructions (Signed)
Evaluation for your arm pain and numbness is overall reassuring.  I do recommend you follow-up with your neurologist as soon as possible.  If you start to experience facial droop, slurred speech, weakness or numbness in your extremities that is worsened, chest pain shortness of breath calf tenderness or any other concerning symptom please return emerged part for evaluation.  Otherwise recommend that you also follow-up with PCP.

## 2023-01-05 NOTE — Progress Notes (Signed)
PATIENT: [name] DOB: Sep 02, 1950  REASON FOR VISIT: follow up HISTORY FROM: patient  Chief Complaint  Patient presents with   Follow-up    Pt in room 1 here for migraine follow up. Pt reports doing well from migraine stand point, rare migraines.     HISTORY OF PRESENT ILLNESS:  01/16/2023 ALL: [name] returns for follow up for migraines. She continues imipramine 50mg  at bedtime. She reports migraines remain well managed. She can't remember the last migraine she had. She does report occasional sinus headaches and occasional tringe if she is late taking imipramine. She seems to tolerate it well. She continues to follow with Dr Lovell Sheehan. Injections with [facility] were not effective. She is considering nerve stimulator. She was diagnosed with sever CTS of left hand. She has an appt with Dr Lovell Sheehan tomorrow.   04/04/2022 ALL: [name] returns for acute visit for an intractable migraine 8/16. She was seen by ER for nausea, headache and neck pain. Migraine cocktail given with little benefit. Low dose Toradol given d/t AKI. Creatine 1.64. She reported hernia repair was cancelled due to elevated A1C of 9.8.   She was last seen 01/2022 and reported migraines were well managed on imipramine 50mg  QHS and Tylenol PRN. She reports that headaches are usually well managed. She has had more, recently, but contributes this to back pain.   She is having significant back and neck pain. She is s/p anterior cervical and lumbar fusion with Dr Lovell Sheehan. She has also been seeing Dr Lorrine Kin for pain management. She reports pain starts in the upper back, radiates to the neck and top of her head. She is taking gabapentin 900mg  at bedtime. Also on duloxetine 30mg  daily. She has been taking cyclobenzaprine 10mg  daily but is not sure it is helping. She has pain with flexion and extension of her neck and back/neck is tender to touch. No obvious triggers or injuries to cause pain.   01/11/2022 ALL: [name] returns for follow up  for migraines. She continues imipramine 50mg  QHS. She continues to feel migraines are well managed. She rarely has a headache. Tylenol works well for abortive therapy. She continues to work with care team for chronic comorbidities.   01/14/2021 ALL: [name] returns for follow up for migraines. She continues imipramine 50mg  at bedtime. She reports headaches are significantly improved. She may take Tylenol a couple times a month and usually aborts headache. She had emergency surgery in 09/2019 for perforated bowel with sepsis. She is recovering well. She is followed closely by GI and PCP. She had the flu last week.   07/16/2020 MM: [name] is a 72 year old female with a history of migraine headaches.  She returns today for follow-up.  She is currently on imipramine 50 mg at bedtime.  She states that since she started this medication she has not had any headaches.  She states that has been working  well for her.  She denies any significant side effects.  She returns today for an evaluation.  04/14/2020 ALL: [name] is a 72 y.o. female here today for follow up for migraines. She was unable to afford Ajovy or Amovig. We have not tried [employer] She continues to have daily headaches. She feels a lot are triggered by chronic sinus infections. She admits that blood sugars are up and down. Stress definitely contributes. She has chronic pain. She is no longer on opioid therapy. She continues gabapentin and duloxetine.   She does snore. She feels that this is related  to her having chronic sinus issues. She occasionally wakes with headache but feels headaches more frequently get worse throughout the day. She denies frequent waking. She denies excessive daytime sleepiness. She denies waking with dry mouth.   She continues regular follow up with PCP, pain management and endocrinology. She is on daily meal replacement insulin as well as daily basil insulin. CBGs "are up and down". Fasting reading this morning was 200.  Last A1C 10. BP usually well managed. She continues metoprolol.    09/10/2019 ALL: [name] is a 72 y.o. female here today for follow up for new onset headaches. She was started on Ajovy for concerns of daily headaches with migrainous features. MRI normal. She reports that headaches improved in December. She repeated injection in January and does not feel that it was as effective. She has had trouble with sinus infection, sore throat, allergy symptoms and feels that has contributed as well. She no longer has daily headaches. She feels that she had a headache at least 10-15 days last month. Still has a lot of stress. CBG's shot up around 400 after taking doxycycline and prednisone, course completed last week. BP is up and down. She has not taken her BP meds today because she has not felt well. She is having neck pain, s/p ACDF in 04/2019. She continues to follow with Dr Ollen Bowl with [facility]. She has had to take oxycodone more recently due to neck pain.     HISTORY: (copied from Dr Trevor Mace note on 07/10/2019)   HPI:  [name] is a 72 y.o. female here as requested by [name], MD for headache.  Past medical history hypertension, DVT on Xarelto, ACDF cervical, diabetes, fibromyalgia, aortic insufficiency.  Being sent for headache.  Patient was seen in the emergency room in July of this year with elevated high blood pressure complaining of headache on the top of the head in the setting of blood pressure 180s over 90s throughout the day, it was 198/100 before she took her blood pressure medications but never significantly decreased, no other focal neurologic symptoms, chronic pain in her neck, she was taking Mucinex which may have been contributing to her hypertension.  CT of the head was negative.  Hydralazine was given, suspected headache due to blood pressure elevation, patient was feeling better when blood pressure improved to the 150s to the 160s.   Mother and grandmother had headaches.  Headaches recently started 7-8 months ago. No inciting events, no new medications or head trauma or anything she can tie it back to. Gradually started. [place] trigers. She has pain in the face behind the eyes and in the sinuses, she was told she had sinusitis but Dr. Jenne Pane ENT said she didn't. The pain comes behind her head, up the neck, and radiates to the temples, feels liks her head is bouncing, pounding and pulsating and throbbing, dull ache, she recently had an ACDF and no changes to the headache since then, not better or worse, she wakes up with the headaches every day, not overly tired during the day, she has headaches every day, she has tylenol, advil, oxycodone, flexeril, lorazepam, heat, ice, no difference, she reports no significant vision changes, the headache is positional and exertional and bending over for example made her hurt and get dizzy.    Reviewed notes, labs and imaging from outside physicians, which showed:   Personally reviewed CT of the head report February 26, 2019 which showed unremarkable brain.   REVIEW OF SYSTEMS:  Out of a complete 14 system review of symptoms, the patient complains only of the following symptoms, chronic headaches, chronic sinusitis, chronic pain, fatigue, anxiety, and all other reviewed systems are negative.   ALLERGIES: Allergies  Allergen Reactions   Dilaudid [Hydromorphone Hcl] Other (See Comments)    Hallucinations/ argumentative, goes beserk   Heparin Other (See Comments)    HIT ab positive  (SRA negative 11/25/17)    Ambien [Zolpidem Tartrate] Other (See Comments)    Up sleep walking and eating   Belsomra [Suvorexant]     ineffective Other reaction(s): Other ineffective   Codeine Nausea And Vomiting   Morphine And Codeine Other (See Comments)    Flushing and feeling hot Tolerates with Diphenhydramine   Penicillins Rash and Other (See Comments)    Caused Headaches also Has patient had a PCN reaction causing immediate rash,  facial/tongue/throat swelling, SOB or lightheadedness with hypotension: No Has patient had a PCN reaction causing severe rash involving mucus membranes or skin necrosis: No Has patient had a PCN reaction that required hospitalization No Has patient had a PCN reaction occurring within the last 10 years: No If all of the above answers are "NO", then may proceed with Cephalosporin use.     Statins Other (See Comments)    Muscle weakness, cramps and aching all over body   Ceftin [Cefuroxime Axetil] Other (See Comments)    Stomach pain    Elavil [Amitriptyline] Other (See Comments)    Loopy feeling   Lovaza [Omega-3-Acid Ethyl Esters] Other (See Comments)    Stomach pain    Lunesta [Eszopiclone] Other (See Comments)    Causes dizziness   Lyrica [Pregabalin] Nausea Only   Metformin And Related Nausea Only    HOME MEDICATIONS: Outpatient Medications Prior to Visit  Medication Sig Dispense Refill   acetaminophen (TYLENOL) 500 MG tablet Take 1,000 mg by mouth every 8 (eight) hours as needed for mild pain or headache.     amLODipine (NORVASC) 2.5 MG tablet Take 2 tablets (5 mg total) by mouth daily. 90 tablet 3   aspirin EC 81 MG tablet Take 81 mg by mouth daily.     cetirizine (ZYRTEC) 10 MG tablet Take 10 mg by mouth at bedtime.     Cholecalciferol (VITAMIN D3) 125 MCG (5000 UT) TABS Take 5,000 Units by mouth in the morning.     Clobetasol Propionate (TEMOVATE) 0.05 % external spray Apply 1 spray topically at bedtime as needed (scalp eczema).     escitalopram (LEXAPRO) 5 MG tablet Take 5 mg by mouth daily.     gabapentin (NEURONTIN) 300 MG capsule Take 900 mg by mouth at bedtime.     Glucagon (GVOKE HYPOPEN 1-PACK) 1 MG/0.2ML SOAJ Inject 1 mg into the skin as needed (hypoglycemia).     HUMALOG 100 UNIT/ML injection Inject 15 Units into the skin with breakfast, with lunch, and with evening meal.     ibandronate (BONIVA) 150 MG tablet Take 150 mg by mouth every 30 (thirty) days.     insulin  glargine (LANTUS) 100 UNIT/ML injection Inject 44 Units into the skin in the morning.     irbesartan-hydrochlorothiazide (AVALIDE) 300-12.5 MG tablet Take 1 tablet by mouth in the morning.     levothyroxine (SYNTHROID) 150 MCG tablet Take 150 mcg by mouth daily before breakfast.     LORazepam (ATIVAN) 0.5 MG tablet Take 0.5-1 mg by mouth every 8 (eight) hours as needed for anxiety.     Magnesium 250 MG TABS Take 250  mg by mouth in the morning.     metoprolol succinate (TOPROL-XL) 25 MG 24 hr tablet TAKE 1 TABLET (25 MG TOTAL) BY MOUTH DAILY. 90 tablet 2   oxyCODONE (ROXICODONE) 5 MG immediate release tablet Take 1 tablet (5 mg total) by mouth every 4 (four) hours as needed for severe pain. 30 tablet 0   pantoprazole (PROTONIX) 40 MG tablet Take 40 mg by mouth daily before breakfast.     Potassium 99 MG TABS Take 99 mg by mouth in the morning.     Probiotic Product (PROBIOTIC PO) Take 1 capsule by mouth in the morning.     promethazine (PHENERGAN) 25 MG tablet Take 25 mg by mouth every 8 (eight) hours as needed for nausea or vomiting.     rosuvastatin (CRESTOR) 20 MG tablet Take 20 mg by mouth in the morning.     temazepam (RESTORIL) 15 MG capsule Take 15 mg by mouth at bedtime.   4   tiZANidine (ZANAFLEX) 4 MG tablet Take 4 mg by mouth every 6 (six) hours as needed for muscle spasms.     vitamin B-12 (CYANOCOBALAMIN) 1000 MCG tablet Take 1,000 mcg by mouth in the morning.     zinc sulfate 220 (50 Zn) MG capsule Take 220 mg by mouth in the morning.     imipramine (TOFRANIL) 50 MG tablet Take 1 tablet (50 mg total) by mouth daily. 90 tablet 3   levothyroxine (SYNTHROID) 175 MCG tablet Take 175 mcg by mouth daily before breakfast. (Patient not taking: Reported on 01/16/2023)  5   No facility-administered medications prior to visit.    PAST MEDICAL HISTORY: Past Medical History:  Diagnosis Date   Anemia    iron deficiency   Blood transfusion    CAD (coronary artery disease), native coronary  artery    coronary CTA showed a coronary Ca score of 506, mild CAD with 25-49% mRCA and moderate CAD with 50-69% sequential mRCA, 25-49% inferior sub branch of D1 and 25-49% in pLCx.     Cervical spondylosis with myelopathy and radiculopathy    CVA (cerebral vascular accident) (HCC)    11/2017 - no weakness or paralysis   Depression    Diabetes mellitus    Type 1   Diastolic dysfunction    Diverticulitis    DVT (deep venous thrombosis) (HCC)    times 2 lower leg   Endometriosis    Fibromyalgia    GERD (gastroesophageal reflux disease)    occ   Hiatal hernia    History of kidney stones    Hyperlipidemia    Hypertension    Hypothyroidism    Mild aortic insufficiency    by echo 04/2013   Osteoarthritis    back and knee   Ovarian cyst    RLS (restless legs syndrome)    Thoracic compression fracture (HCC)    Villous adenoma of right colon 04/08/2016   Vitamin D deficiency disease     PAST SURGICAL HISTORY: Past Surgical History:  Procedure Laterality Date   ANTERIOR CERVICAL DECOMP/DISCECTOMY FUSION N/A 03/23/2017   Procedure: ANTERIOR CERVICAL DECOMPRESSION/DISCECTOMY FUSION, INTERBODY PROSTHESIS,PLATE CERVICAL THREE- CERVICAL FOUR, CERVICAL FOUR- CERVICAL FIVE, CERVICAL FIVE- CERVICAL SIX;  Surgeon: Tressie Stalker, MD;  Location: [facility] OR;  Service: Neurosurgery;  Laterality: N/A;   ANTERIOR CERVICAL DECOMP/DISCECTOMY FUSION N/A 04/18/2019   Procedure: ANTERIOR CERVICAL DECOMPRESSION/DISCECTOMY  CERVICAL SIX- CERVICAL SEVEN;  Surgeon: Tressie Stalker, MD;  Location: [facility] OR;  Service: Neurosurgery;  Laterality: N/A;  anterior   APPENDECTOMY  BACK SURGERY  2005   April  2013 - spinal fusion@ [facility]   bowel blockage surgery     BREAST SURGERY     breast reduction   CARDIAC CATHETERIZATION  04/2013   normal coronary arteries and normal LVF   CARPAL TUNNEL RELEASE  06/05/2012   Procedure: CARPAL TUNNEL RELEASE;  Surgeon: Wyn Forster., MD;  Location: [facility];  Service: Orthopedics;  Laterality: Left;   CESAREAN SECTION     x 2   CHOLECYSTECTOMY     COLECTOMY  04/08/2016   DILATION AND CURETTAGE OF UTERUS     DORSAL COMPARTMENT RELEASE  06/05/2012   Procedure: RELEASE DORSAL COMPARTMENT (DEQUERVAIN);  Surgeon: Wyn Forster., MD;  Location: [facility];  Service: Orthopedics;  Laterality: Left;  Excision of mixoid cyst also   EYE SURGERY     cataracts bilateral   INCISIONAL HERNIA REPAIR N/A 05/18/2022   Procedure: REPAIR INCISIONAL HERNIA WITH MESH;  Surgeon: Harriette Bouillon, MD;  Location: [facility] OR;  Service: General;  Laterality: N/A;   JOINT REPLACEMENT     Bilateral knee   KNEE ARTHROPLASTY  2009   lft partial   KNEE ARTHROPLASTY     rt   LAPAROSCOPIC PARTIAL COLECTOMY N/A 04/08/2016   Procedure: LAPAROSCOPIC ASSISTED ASCENDING COLECTOMY POSSIBLE OPEN COLECTOMY;  Surgeon: Claud Kelp, MD;  Location: [facility] OR;  Service: General;  Laterality: N/A;   orthopedic surgeries     multiple   perforated bowl     Sep 09 2020   REDUCTION MAMMAPLASTY Bilateral    SHOULDER OPEN ROTATOR CUFF REPAIR     rt and lft   TEE WITHOUT CARDIOVERSION N/A 11/23/2017   Procedure: TRANSESOPHAGEAL ECHOCARDIOGRAM (TEE);  Surgeon: Jake Bathe, MD;  Location: [facility] ENDOSCOPY;  Service: Cardiovascular;  Laterality: N/A;   TUBAL LIGATION     btsp    FAMILY HISTORY: Family History  Problem Relation Age of Onset   Heart attack Father        18s   Hypothyroidism Father    Diabetes type II Father    Kidney failure Father    Diabetes type II Mother    Hypertension Mother    Breast cancer Mother 52   Dementia Mother    Hypothyroidism Brother    Diabetes Brother    Breast cancer Maternal Aunt    Breast cancer Cousin    Breast cancer Maternal Aunt    Breast cancer Cousin    High blood pressure Other        "all"   Heart attack Brother    Lung cancer Brother     SOCIAL HISTORY: Social History   Socioeconomic History   Marital  status: Married    Spouse name: Gala Romney / Debroah Loop   Number of children: 2   Years of education: trade school   Highest education level: Not on file  Occupational History   Occupation: retired  Tobacco Use   Smoking status: Never   Smokeless tobacco: Never  Building services engineer Use: Never used  Substance and Sexual Activity   Alcohol use: Never   Drug use: Never   Sexual activity: Not Currently    Birth control/protection: Post-menopausal, Surgical  Other Topics Concern   Not on file  Social History Narrative   Lives at home with her husband   Right handed   Caffeine: 2-3 cups of coffee/day   Social Determinants of Health   Financial Resource Strain: Not  on file  Food Insecurity: No Food Insecurity (06/02/2022)   Hunger Vital Sign    Worried About Running Out of Food in the Last Year: Never true    Ran Out of Food in the Last Year: Never true  Transportation Needs: No Transportation Needs (06/02/2022)   PRAPARE - Administrator, Civil Service (Medical): No    Lack of Transportation (Non-Medical): No  Physical Activity: Not on file  Stress: Not on file  Social Connections: Not on file  Intimate Partner Violence: Not At Risk (06/02/2022)   Humiliation, Afraid, Rape, and Kick questionnaire    Fear of Current or Ex-Partner: No    Emotionally Abused: No    Physically Abused: No    Sexually Abused: No      PHYSICAL EXAM  Vitals:   01/16/23 1101  BP: 126/73  Pulse: 96  Weight: 211 lb 8 oz (95.9 kg)  Height: 5\' 3"  (1.6 m)       Body mass index is 37.47 kg/m.  Generalized: Well developed, in no acute distress  Cardiology: normal rate and rhythm, no murmur noted Respiratory: clear to auscultation bilaterally  Neurological examination  Mentation: Alert oriented to time, place, history taking. Follows all commands speech and language fluent Cranial nerve II-XII: Pupils were equal round reactive to light. Extraocular movements were full, visual field were  full on confrontational test. Facial sensation and strength were normal. Uvula tongue midline. Head turning and shoulder shrug  were normal and symmetric. Motor: The motor testing reveals 5 over 5 strength of all 4 extremities. Good symmetric motor tone is noted throughout.  Gait and station: Gait is normal.   She reports significant tenderness with palpation of paracervical spinal and trapezius muscles. Pain reported with flexion and extension of neck.    DIAGNOSTIC DATA (LABS, IMAGING, TESTING) - I reviewed patient records, labs, notes, testing and imaging myself where available.      No data to display           Lab Results  Component Value Date   WBC 8.1 12/18/2022   HGB 12.9 12/18/2022   HCT 39.8 12/18/2022   MCV 83.3 12/18/2022   PLT 241 12/18/2022      Component Value Date/Time   NA 136 12/18/2022 1830   NA 133 (L) 02/18/2020 1119   K 4.1 12/18/2022 1830   CL 101 12/18/2022 1830   CO2 25 12/18/2022 1830   GLUCOSE 168 (H) 12/18/2022 1830   BUN 22 12/18/2022 1830   BUN 14 02/18/2020 1119   CREATININE 1.68 (H) 12/18/2022 1830   CALCIUM 9.1 12/18/2022 1830   PROT 6.6 06/02/2022 1011   PROT 6.5 07/10/2019 1442   ALBUMIN 3.2 (L) 06/02/2022 1011   ALBUMIN 4.1 07/10/2019 1442   AST 11 (L) 06/02/2022 1011   ALT 11 06/02/2022 1011   ALKPHOS 76 06/02/2022 1011   BILITOT 1.3 (H) 06/02/2022 1011   BILITOT 0.4 07/10/2019 1442   GFRNONAA 32 (L) 12/18/2022 1830   GFRAA 73 02/18/2020 1119   Lab Results  Component Value Date   CHOL 162 03/25/2020   HDL 48 03/25/2020   LDLCALC 94 03/25/2020   TRIG 109 03/25/2020   CHOLHDL 3.4 03/25/2020   Lab Results  Component Value Date   HGBA1C 9.8 (H) 03/07/2022   No results found for: "VITAMINB12" Lab Results  Component Value Date   TSH 1.310 07/10/2019       ASSESSMENT AND PLAN 72 y.o. year old female  has a  past medical history of Anemia, Blood transfusion, CAD (coronary artery disease), native coronary artery,  Cervical spondylosis with myelopathy and radiculopathy, CVA (cerebral vascular accident) (HCC), Depression, Diabetes mellitus, Diastolic dysfunction, Diverticulitis, DVT (deep venous thrombosis) (HCC), Endometriosis, Fibromyalgia, GERD (gastroesophageal reflux disease), Hiatal hernia, History of kidney stones, Hyperlipidemia, Hypertension, Hypothyroidism, Mild aortic insufficiency, Osteoarthritis, Ovarian cyst, RLS (restless legs syndrome), Thoracic compression fracture (HCC), Villous adenoma of right colon (04/08/2016), and Vitamin D deficiency disease. here with     ICD-10-CM   1. Chronic migraine without aura without status migrainosus, not intractable  G43.709       [name] reports headaches are well managed. Rare migraines. She is tolerating imipramine with no obvious adverse effects. She will continue 50mg  at bedtime. Healthy lifestyle habits advised.  I have encouraged her to continue close follow up with PCP, GI, NS, pain management and endocrinology. Healthy lifestyle habits discussed. She will follow up with me in 1 year, sooner if needed.    No orders of the defined types were placed in this encounter.     Meds ordered this encounter  Medications   imipramine (TOFRANIL) 50 MG tablet    Sig: Take 1 tablet (50 mg total) by mouth daily.    Dispense:  90 tablet    Refill:  3    Order Specific Question:   Supervising Provider    Answer:   Bernestine Amass, FNP-C 01/16/2023, 11:42 AM [facility] [address] [place], [place] [address] [phone]

## 2023-01-05 NOTE — Patient Instructions (Signed)
Below is our plan:  We will continue imipramine 50mg  at bedtime    Please make sure you are staying well hydrated. I recommend 50-60 ounces daily. Well balanced diet and regular exercise encouraged. Consistent sleep schedule with 6-8 hours recommended.   Please continue follow up with care team as directed.   Follow up with me in 1 year   You may receive a survey regarding today's visit. I encourage you to leave honest feed back as I do use this information to improve patient care. Thank you for seeing me today!  GENERAL HEADACHE INFORMATION:   Natural supplements: Magnesium Oxide or Magnesium Glycinate 500 mg at bed (up to 800 mg daily) Coenzyme Q10 300 mg in AM Vitamin B2- 200 mg twice a day   Add 1 supplement at a time since even natural supplements can have undesirable side effects. You can sometimes buy supplements cheaper (especially Coenzyme Q10) at www.WebmailGuide.co.za or at [facility].  Migraine with aura: There is increased risk for stroke in women with migraine with aura and a contraindication for the combined contraceptive pill for use by women who have migraine with aura. The risk for women with migraine without aura is lower. However other risk factors like smoking are far more likely to increase stroke risk than migraine. There is a recommendation for no smoking and for the use of OCPs without estrogen such as progestogen only pills particularly for women with migraine with aura.Marland Kitchen People who have migraine headaches with auras may be 3 times more likely to have a stroke caused by a blood clot, compared to migraine patients who don't see auras. Women who take hormone-replacement therapy may be 30 percent more likely to suffer a clot-based stroke than women not taking medication containing estrogen. Other risk factors like smoking and high blood pressure may be  much more important.    Vitamins and herbs that show potential:   Magnesium: Magnesium (250 mg twice a day or 500 mg at bed) has  a relaxant effect on smooth muscles such as blood vessels. Individuals suffering from frequent or daily headache usually have low magnesium levels which can be increase with daily supplementation of 400-750 mg. Three trials found 40-90% average headache reduction  when used as a preventative. Magnesium may help with headaches are aura, the best evidence for magnesium is for migraine with aura is its thought to stop the cortical spreading depression we believe is the pathophysiology of migraine aura.Magnesium also demonstrated the benefit in menstrually related migraine.  Magnesium is part of the messenger system in the serotonin cascade and it is a good muscle relaxant.  It is also useful for constipation which can be a side effect of other medications used to treat migraine. Good sources include nuts, whole grains, and tomatoes. Side Effects: loose stool/diarrhea  Riboflavin (vitamin B 2) 200 mg twice a day. This vitamin assists nerve cells in the production of ATP a principal energy storing molecule.  It is necessary for many chemical reactions in the body.  There have been at least 3 clinical trials of riboflavin using 400 mg per day all of which suggested that migraine frequency can be decreased.  All 3 trials showed significant improvement in over half of migraine sufferers.  The supplement is found in bread, cereal, milk, meat, and poultry.  Most Americans get more riboflavin than the recommended daily allowance, however riboflavin deficiency is not necessary for the supplements to help prevent headache. Side effects: energizing, green urine   Coenzyme Q10: This  is present in almost all cells in the body and is critical component for the conversion of energy.  Recent studies have shown that a nutritional supplement of CoQ10 can reduce the frequency of migraine attacks by improving the energy production of cells as with riboflavin.  Doses of 150 mg twice a day have been shown to be effective.   Melatonin:  Increasing evidence shows correlation between melatonin secretion and headache conditions.  Melatonin supplementation has decreased headache intensity and duration.  It is widely used as a sleep aid.  Sleep is natures way of dealing with migraine.  A dose of 3 mg is recommended to start for headaches including cluster headache. Higher doses up to 15 mg has been reviewed for use in Cluster headache and have been used. The rationale behind using melatonin for cluster is that many theories regarding the cause of Cluster headache center around the disruption of the normal circadian rhythm in the brain.  This helps restore the normal circadian rhythm.   HEADACHE DIET: Foods and beverages which may trigger migraine Note that only 20% of headache patients are food sensitive. You will know if you are food sensitive if you get a headache consistently 20 minutes to 2 hours after eating a certain food. Only cut out a food if it causes headaches, otherwise you might remove foods you enjoy! What matters most for diet is to eat a well balanced healthy diet full of vegetables and low fat protein, and to not miss meals.   Chocolate, other sweets ALL cheeses except cottage and cream cheese Dairy products, yogurt, sour cream, ice cream Liver Meat extracts (Bovril, Marmite, meat tenderizers) Meats or fish which have undergone aging, fermenting, pickling or smoking. These include: Hotdogs,salami,Lox,sausage, mortadellas,smoked salmon, pepperoni, Pickled herring Pods of broad bean (English beans, Chinese pea pods, [place] (fava) beans, lima and navy beans Ripe avocado, ripe banana Yeast extracts or active yeast preparations such as Brewer's or Fleishman's (commercial bakes goods are permitted) Tomato based foods, pizza (lasagna, etc.)   MSG (monosodium glutamate) is disguised as many things; look for these common aliases: Monopotassium glutamate Autolysed yeast Hydrolysed protein Sodium  caseinate "flavorings" "all natural preservatives" Nutrasweet   Avoid all other foods that convincingly provoke headaches.   Resources: The Dizzy [place] Laundry Your Headache Diet, migrainestrong.com  https://zamora-andrews.com/   Caffeine and Migraine For patients that have migraine, caffeine intake more than 3 days per week can lead to dependency and increased migraine frequency. I would recommend cutting back on your caffeine intake as best you can. The recommended amount of caffeine is 200-300 mg daily, although migraine patients may experience dependency at even lower doses. While you may notice an increase in headache temporarily, cutting back will be helpful for headaches in the long run. For more information on caffeine and migraine, visit: https://americanmigrainefoundation.org/resource-library/caffeine-and-migraine/   Headache Prevention Strategies:   1. Maintain a headache diary; learn to identify and avoid triggers.  - This can be a simple note where you log when you had a headache, associated symptoms, and medications used - There are several smartphone apps developed to help track migraines: Migraine Buddy, Migraine Monitor, Curelator N1-Headache App   Common triggers include: Emotional triggers: Emotional/Upset family or friends Emotional/Upset occupation Business reversal/success Anticipation anxiety Crisis-serious Post-crisis periodNew job/position   Physical triggers: Vacation Day Weekend Strenuous Exercise High Altitude Location New Move Menstrual Day Physical Illness Oversleep/Not enough sleep Weather changes Light: Photophobia or light sesnitivity treatment involves a balance between desensitization and reduction in overly strong  input. Use dark polarized glasses outside, but not inside. Avoid bright or fluorescent light, but do not dim environment to the point that going into a normally lit room hurts. Consider  FL-41 tint lenses, which reduce the most irritating wavelengths without blocking too much light.  These can be obtained at axonoptics.com or theraspecs.com Foods: see list above.   2. Limit use of acute treatments (over-the-counter medications, triptans, etc.) to no more than 2 days per week or 10 days per month to prevent medication overuse headache (rebound headache).     3. Follow a regular schedule (including weekends and holidays): Don't skip meals. Eat a balanced diet. 8 hours of sleep nightly. Minimize stress. Exercise 30 minutes per day. Being overweight is associated with a 5 times increased risk of chronic migraine. Keep well hydrated and drink 6-8 glasses of water per day.   4. Initiate non-pharmacologic measures at the earliest onset of your headache. Rest and quiet environment. Relax and reduce stress. Breathe2Relax is a free app that can instruct you on    some simple relaxtion and breathing techniques. Http://Dawnbuse.com is a    free website that provides teaching videos on relaxation.  Also, there are  many apps that   can be downloaded for "mindful" relaxation.  An app called YOGA NIDRA will help walk you through mindfulness. Another app called Calm can be downloaded to give you a structured mindfulness guide with daily reminders and skill development. Headspace for guided meditation Mindfulness Based Stress Reduction Online Course: www.palousemindfulness.com Cold compresses.   5. Don't wait!! Take the maximum allowable dosage of prescribed medication at the first sign of migraine.   6. Compliance:  Take prescribed medication regularly as directed and at the first sign of a migraine.   7. Communicate:  Call your physician when problems arise, especially if your headaches change, increase in frequency/severity, or become associated with neurological symptoms (weakness, numbness, slurred speech, etc.). Proceed to emergency room if you experience new or worsening symptoms or  symptoms do not resolve, if you have new neurologic symptoms or if headache is severe, or for any concerning symptom.   8. Headache/pain management therapies: Consider various complementary methods, including medication, behavioral therapy, psychological counselling, biofeedback, massage therapy, acupuncture, dry needling, and other modalities.  Such measures may reduce the need for medications. Counseling for pain management, where patients learn to function and ignore/minimize their pain, seems to work very well.   9. Recommend changing family's attention and focus away from patient's headaches. Instead, emphasize daily activities. If first question of day is 'How are your headaches/Do you have a headache today?', then patient will constantly think about headaches, thus making them worse. Goal is to re-direct attention away from headaches, toward daily activities and other distractions.   10. Helpful Websites: www.AmericanHeadacheSociety.org PatentHood.ch www.headaches.org TightMarket.nl www.achenet.org

## 2023-01-16 ENCOUNTER — Ambulatory Visit: Payer: Medicare Other | Admitting: Family Medicine

## 2023-01-16 ENCOUNTER — Encounter: Payer: Self-pay | Admitting: Family Medicine

## 2023-01-16 VITALS — BP 126/73 | HR 96 | Ht 63.0 in | Wt 211.5 lb

## 2023-01-16 DIAGNOSIS — G43709 Chronic migraine without aura, not intractable, without status migrainosus: Secondary | ICD-10-CM

## 2023-01-16 MED ORDER — IMIPRAMINE HCL 50 MG PO TABS: 50.0000 mg | ORAL_TABLET | Freq: Every day | ORAL | 3 refills | Status: DC

## 2023-03-17 ENCOUNTER — Other Ambulatory Visit: Payer: Self-pay | Admitting: Cardiology

## 2023-03-22 ENCOUNTER — Other Ambulatory Visit: Payer: Self-pay | Admitting: Family Medicine

## 2023-03-22 DIAGNOSIS — Z1231 Encounter for screening mammogram for malignant neoplasm of breast: Secondary | ICD-10-CM

## 2023-05-01 ENCOUNTER — Ambulatory Visit
Admission: RE | Admit: 2023-05-01 | Discharge: 2023-05-01 | Disposition: A | Discharge status: Home / Self Care | Payer: Medicare Other | Source: Ambulatory Visit | Attending: Family Medicine | Admitting: Family Medicine

## 2023-05-01 DIAGNOSIS — Z1231 Encounter for screening mammogram for malignant neoplasm of breast: Secondary | ICD-10-CM

## 2023-05-23 NOTE — Progress Notes (Signed)
92.4 kg BSA:        2.07 m^2 Pt. Status: Room:  ATTENDING    Morgan Magic, MD ORDERING     Morgan Magic, MD REFERRING    Morgan Magic, MD SONOGRAPHER  [name], RDCS PERFORMING   [facility] Outpatient  cc:  ------------------------------------------------------------------- LV EF: 65% -    70%  ------------------------------------------------------------------- Indications:      R55 Pre Syncope.  ------------------------------------------------------------------- History:   PMH:  Acquired from the patient and from the patient&'s chart.  PMH:  Anemia. Degenerative joint disease. DVT. Fibromyalgia. History of kidney stones. Hypothyroidism.  Risk factors:  Hypertension. Diabetes mellitus. Dyslipidemia.  ------------------------------------------------------------------- Study Conclusions  - Left ventricle: The cavity size was normal. Wall thickness was increased in a pattern of moderate LVH. Systolic function was vigorous. The estimated ejection fraction was in the range of 65% to 70%. Wall motion was normal; there were no regional wall motion abnormalities. Doppler parameters are consistent with abnormal left ventricular relaxation (grade 1 diastolic dysfunction). - Mitral valve: Calcified annulus. - Left atrium: The atrium was mildly dilated.  ------------------------------------------------------------------- Study data:   Study status:  Routine.  Procedure:  The patient reported no pain pre or post test. Transthoracic echocardiography for left ventricular function evaluation, for right ventricular function evaluation, and for assessment of valvular function. Image quality was adequate.  Study completion:  There were no complications.          Transthoracic echocardiography.  M-mode, complete 2D, spectral Doppler, and color Doppler.  Birthdate: Patient birthdate: 1951/05/15.  Age:  Patient is 72 yr old.  Sex: Gender: female.    BMI: 36.1 kg/m^2.  Blood pressure:     132/70 Patient status:  Outpatient.  Study date:  Study date: 09/08/2017. Study time: 09:52 AM.  Location:  [facility]  -------------------------------------------------------------------  ------------------------------------------------------------------- Left ventricle:  The cavity size was  normal. Wall thickness was increased in a pattern of moderate LVH. Systolic function was vigorous. The estimated ejection fraction was in the range of 65% to 70%. Wall motion was normal; there were no regional wall motion abnormalities. Doppler parameters are consistent with abnormal left ventricular relaxation (grade 1 diastolic dysfunction).  ------------------------------------------------------------------- Aortic valve:   Trileaflet; normal thickness leaflets. Mobility was not restricted.  Doppler:  Transvalvular velocity was within the normal range. There was no stenosis. There was no regurgitation.  ------------------------------------------------------------------- Aorta:  Aortic root: The aortic root was normal in size.  ------------------------------------------------------------------- Mitral valve:   Calcified annulus. Mobility was not restricted. Doppler:  Transvalvular velocity was within the normal range. There was no evidence for stenosis. There was no regurgitation.  ------------------------------------------------------------------- Left atrium:  The atrium was mildly dilated.  ------------------------------------------------------------------- Right ventricle:  The cavity size was normal. Wall thickness was normal. Systolic function was normal.  ------------------------------------------------------------------- Pulmonic valve:   Poorly visualized.  Structurally normal valve. Cusp separation was normal.  Doppler:  Transvalvular velocity was within the normal range. There was no evidence for stenosis. There was no regurgitation.  ------------------------------------------------------------------- Tricuspid valve:   Structurally normal valve.    Doppler: Transvalvular velocity was within the normal range. There was no regurgitation.  ------------------------------------------------------------------- Pulmonary artery:   The main pulmonary artery was  normal-sized. Systolic pressure was within the normal range.  ------------------------------------------------------------------- Right atrium:  The atrium was normal in size.  ------------------------------------------------------------------- Pericardium:  There was no pericardial effusion.  ------------------------------------------------------------------- Systemic veins: Inferior vena cava: The vessel was normal in size.  ------------------------------------------------------------------- Measurements  Left ventricle  Cardiology Office Note:  .   Date:  05/26/2023  ID:  [name], DOB [birth year], MRN [id number] PCP: Morgan Montana, MD  [facility] Providers Cardiologist:  Morgan Magic, MD    History of Present Illness: .   [name] is a 72 y.o. female with a past medical history of type 2 diabetes, hypothyroidism, hypertension, depression, GERD, CKD stage III, fibromyalgia, PACs, history of CVA. Patient is followed by Dr. Mayford [name] and presents today for an annual follow up appointment   Per chart review, patient previously underwent echocardiogram on 09/08/17 that showed EF 65-70%, moderate LVH, no regional wall motion abnormalities, grade 1 diastolic dysfunction.  She also wore cardiac monitor in 09/2017 that showed normal sinus rhythm with frequent PACs.  Later had a stroke in 11/2017.  Underwent TEE on 11/23/17 that showed EF 55-60%, mild AI, mild MR, no defect or patent foramen ovale identified.  There was no cardiac source of emboli on TEE.  Patient was seen in the ER in 11/2019 with chest pain that started while she was driving her car.  She underwent coronary CTA on 01/13/2020 that showed a coronary calcium score 506, mild CAD with 25-49% mRCA and moderate CAD with 50-69% sequential mRCA, 25-49% inferior subbranch of D1 and 25-49% and P LCx.  CT FFR flow analysis demonstrated no hemodynamically significant flow-limiting lesions.  She was managed medically  Patient was last seen by cardiology on 06/09/2022.  At that time, patient denied chest pain or pressure, shortness of breath, DOE, PND, orthopnea.  Remained on aspirin 81 mg daily, statin therapy.  Also remained on amlodipine 2.5 mg, irbesartan/HCTZ 300-12.5 mg daily, Toprol XL 25 mg daily.  Today, patient presents for her annual follow up appointment. Reports that she has been having intermittent episodes of sudden heat, sweating, and near syncope occurring approximately once or twice a month for the past year. These episodes are not associated  with any specific activity or position and are not accompanied by chest pain or palpitations. The patient has experienced one episode of syncope in the past year, during 04/2023, during which she collapsed in her kitchen. She denies palpitations, ankle edema, dizziness, shortness of breath, chest pain.   Patient's physical activity is somewhat limited. She has difficulty walking due to hip pain, which she attributes to arthritis. She is currently managing the pain with gabapentin and occasional hydrocodone, though there are plans to discontinue the latter.The patient has a history of knee surgeries, which have been successful and are not currently causing any issues. She also reports having had three rotator cuff surgeries due to injuries sustained during her youth.  The patient's diabetes has reportedly been difficult to control recently, and there are plans to start her on an insulin pump. She has not reported any specific symptoms related to her diabetes during the consultation.   ROS: denies chest pain, shortness of breath, ankle edema, palpitations. Has had episodes of near syncope and one episode of syncope in 04/2023.   Studies Reviewed: .   Cardiac Studies & Procedures       ECHOCARDIOGRAM  ECHOCARDIOGRAM COMPLETE 09/08/2017  Narrative *[facility] [address] [place], [place] [address] [phone]  ------------------------------------------------------------------- Transthoracic Echocardiography  Patient:    [name], [name] MR #:       [id number] Study Date: 09/08/2017 Gender:     F Age:        66 Height:     160 cm Weight:  CVA 436.  ------------------------------------------------------------------- Study Conclusions  - Left ventricle: The cavity size was normal. Wall thickness was normal. Systolic function was normal. The estimated ejection fraction was in the range of 55% to 60%. - Aortic valve: No evidence of vegetation. There was mild regurgitation. - Mitral valve: No evidence of vegetation. There was mild regurgitation. - Left atrium: No evidence of thrombus in the appendage. - Right atrium: No evidence of thrombus in the atrial cavity or appendage. - Atrial septum: No defect or patent foramen ovale was identified. Echo contrast study showed no right-to-left atrial level shunt, following an increase in RA pressure induced by provocative maneuvers. - Tricuspid valve: No evidence of vegetation. - Pulmonic valve: No evidence of vegetation. - Superior vena cava: The study excluded a thrombus.  Impressions:  - No  cardiac source of emboli was indentified.  ------------------------------------------------------------------- Study data:   Study status:  Routine.  Consent:  The risks, benefits, and alternatives to the procedure were explained to the patient and informed consent was obtained.  Procedure:  The patient reported no pain pre or post test. Initial setup. The patient was brought to the laboratory. Surface ECG leads were monitored. Sedation. Conscious sedation was administered by anesthesiology staff. Transesophageal echocardiography. An adult multiplane transesophageal probe was inserted by the attending cardiologistwithout difficulty. Image quality was adequate.  Study completion:  The patient tolerated the procedure well. There were no complications.  Administered medications:   Propofol. Diagnostic transesophageal echocardiography.  2D and color Doppler. Birthdate:  Patient birthdate: 1951-03-30.  Age:  Patient is 72 yr old.  Sex:  Gender: female.    BMI: 36.2 kg/m^2.  Blood pressure: 97/45  Patient status:  Inpatient.  Study date:  Study date: 11/23/2017. Study time: 01:01 PM.  Location:  [facility].  -------------------------------------------------------------------  ------------------------------------------------------------------- Left ventricle:  The cavity size was normal. Wall thickness was normal. Systolic function was normal. The estimated ejection fraction was in the range of 55% to 60%.  ------------------------------------------------------------------- Aortic valve:   Mildly thickened, mildly calcified leaflets. Cusp separation was normal.  No evidence of vegetation.  Doppler:  There was mild regurgitation.  ------------------------------------------------------------------- Aorta:  The aorta was not dilated, mildly calcified, and non-diseased.  ------------------------------------------------------------------- Mitral valve:   Structurally normal valve.   Leaflet  separation was normal.  No evidence of vegetation.  Doppler:  There was mild regurgitation.  ------------------------------------------------------------------- Left atrium:  The atrium was normal in size.  No evidence of thrombus in the appendage. The appendage was of normal size. Emptying velocity was normal.  ------------------------------------------------------------------- Atrial septum:  No defect or patent foramen ovale was identified. Echo contrast study showed no right-to-left atrial level shunt, following an increase in RA pressure induced by provocative maneuvers.  ------------------------------------------------------------------- Right ventricle:  The cavity size was normal. Wall thickness was normal. Systolic function was normal.  ------------------------------------------------------------------- Pulmonic valve:    Structurally normal valve.   Cusp separation was normal.  No evidence of vegetation.  ------------------------------------------------------------------- Tricuspid valve:   Structurally normal valve.   Leaflet separation was normal.  No evidence of vegetation.  Doppler:  There was mild regurgitation.  ------------------------------------------------------------------- Pulmonary artery:   The main pulmonary artery was normal-sized.  ------------------------------------------------------------------- Right atrium:  The atrium was normal in size.  No evidence of thrombus in the atrial cavity or appendage.  ------------------------------------------------------------------- Pericardium:  The pericardium was normal in appearance. There was no pericardial effusion.  ------------------------------------------------------------------- Systemic veins: Superior vena cava: The study excluded a thrombus.  ------------------------------------------------------------------- Post procedure conclusions Ascending Aorta:  -  Cardiology Office Note:  .   Date:  05/26/2023  ID:  [name], DOB [birth year], MRN [id number] PCP: Morgan Montana, MD  [facility] Providers Cardiologist:  Morgan Magic, MD    History of Present Illness: .   [name] is a 72 y.o. female with a past medical history of type 2 diabetes, hypothyroidism, hypertension, depression, GERD, CKD stage III, fibromyalgia, PACs, history of CVA. Patient is followed by Dr. Mayford [name] and presents today for an annual follow up appointment   Per chart review, patient previously underwent echocardiogram on 09/08/17 that showed EF 65-70%, moderate LVH, no regional wall motion abnormalities, grade 1 diastolic dysfunction.  She also wore cardiac monitor in 09/2017 that showed normal sinus rhythm with frequent PACs.  Later had a stroke in 11/2017.  Underwent TEE on 11/23/17 that showed EF 55-60%, mild AI, mild MR, no defect or patent foramen ovale identified.  There was no cardiac source of emboli on TEE.  Patient was seen in the ER in 11/2019 with chest pain that started while she was driving her car.  She underwent coronary CTA on 01/13/2020 that showed a coronary calcium score 506, mild CAD with 25-49% mRCA and moderate CAD with 50-69% sequential mRCA, 25-49% inferior subbranch of D1 and 25-49% and P LCx.  CT FFR flow analysis demonstrated no hemodynamically significant flow-limiting lesions.  She was managed medically  Patient was last seen by cardiology on 06/09/2022.  At that time, patient denied chest pain or pressure, shortness of breath, DOE, PND, orthopnea.  Remained on aspirin 81 mg daily, statin therapy.  Also remained on amlodipine 2.5 mg, irbesartan/HCTZ 300-12.5 mg daily, Toprol XL 25 mg daily.  Today, patient presents for her annual follow up appointment. Reports that she has been having intermittent episodes of sudden heat, sweating, and near syncope occurring approximately once or twice a month for the past year. These episodes are not associated  with any specific activity or position and are not accompanied by chest pain or palpitations. The patient has experienced one episode of syncope in the past year, during 04/2023, during which she collapsed in her kitchen. She denies palpitations, ankle edema, dizziness, shortness of breath, chest pain.   Patient's physical activity is somewhat limited. She has difficulty walking due to hip pain, which she attributes to arthritis. She is currently managing the pain with gabapentin and occasional hydrocodone, though there are plans to discontinue the latter.The patient has a history of knee surgeries, which have been successful and are not currently causing any issues. She also reports having had three rotator cuff surgeries due to injuries sustained during her youth.  The patient's diabetes has reportedly been difficult to control recently, and there are plans to start her on an insulin pump. She has not reported any specific symptoms related to her diabetes during the consultation.   ROS: denies chest pain, shortness of breath, ankle edema, palpitations. Has had episodes of near syncope and one episode of syncope in 04/2023.   Studies Reviewed: .   Cardiac Studies & Procedures       ECHOCARDIOGRAM  ECHOCARDIOGRAM COMPLETE 09/08/2017  Narrative *[facility]* [address] [place], [place] [address] [phone]  ------------------------------------------------------------------- Transthoracic Echocardiography  Patient:    [name], [name] MR #:       [id number] Study Date: 09/08/2017 Gender:     F Age:        66 Height:     160 cm Weight:  Cardiology Office Note:  .   Date:  05/26/2023  ID:  [name], DOB [birth year], MRN [id number] PCP: Morgan Montana, MD  [facility] Providers Cardiologist:  Morgan Magic, MD    History of Present Illness: .   [name] is a 72 y.o. female with a past medical history of type 2 diabetes, hypothyroidism, hypertension, depression, GERD, CKD stage III, fibromyalgia, PACs, history of CVA. Patient is followed by Dr. Mayford [name] and presents today for an annual follow up appointment   Per chart review, patient previously underwent echocardiogram on 09/08/17 that showed EF 65-70%, moderate LVH, no regional wall motion abnormalities, grade 1 diastolic dysfunction.  She also wore cardiac monitor in 09/2017 that showed normal sinus rhythm with frequent PACs.  Later had a stroke in 11/2017.  Underwent TEE on 11/23/17 that showed EF 55-60%, mild AI, mild MR, no defect or patent foramen ovale identified.  There was no cardiac source of emboli on TEE.  Patient was seen in the ER in 11/2019 with chest pain that started while she was driving her car.  She underwent coronary CTA on 01/13/2020 that showed a coronary calcium score 506, mild CAD with 25-49% mRCA and moderate CAD with 50-69% sequential mRCA, 25-49% inferior subbranch of D1 and 25-49% and P LCx.  CT FFR flow analysis demonstrated no hemodynamically significant flow-limiting lesions.  She was managed medically  Patient was last seen by cardiology on 06/09/2022.  At that time, patient denied chest pain or pressure, shortness of breath, DOE, PND, orthopnea.  Remained on aspirin 81 mg daily, statin therapy.  Also remained on amlodipine 2.5 mg, irbesartan/HCTZ 300-12.5 mg daily, Toprol XL 25 mg daily.  Today, patient presents for her annual follow up appointment. Reports that she has been having intermittent episodes of sudden heat, sweating, and near syncope occurring approximately once or twice a month for the past year. These episodes are not associated  with any specific activity or position and are not accompanied by chest pain or palpitations. The patient has experienced one episode of syncope in the past year, during 04/2023, during which she collapsed in her kitchen. She denies palpitations, ankle edema, dizziness, shortness of breath, chest pain.   Patient's physical activity is somewhat limited. She has difficulty walking due to hip pain, which she attributes to arthritis. She is currently managing the pain with gabapentin and occasional hydrocodone, though there are plans to discontinue the latter.The patient has a history of knee surgeries, which have been successful and are not currently causing any issues. She also reports having had three rotator cuff surgeries due to injuries sustained during her youth.  The patient's diabetes has reportedly been difficult to control recently, and there are plans to start her on an insulin pump. She has not reported any specific symptoms related to her diabetes during the consultation.   ROS: denies chest pain, shortness of breath, ankle edema, palpitations. Has had episodes of near syncope and one episode of syncope in 04/2023.   Studies Reviewed: .   Cardiac Studies & Procedures       ECHOCARDIOGRAM  ECHOCARDIOGRAM COMPLETE 09/08/2017  Narrative *[facility] [address] [place], [place] [address] [phone]  ------------------------------------------------------------------- Transthoracic Echocardiography  Patient:    [name], [name] MR #:       [id number] Study Date: 09/08/2017 Gender:     F Age:        66 Height:     160 cm Weight:  Cardiology Office Note:  .   Date:  05/26/2023  ID:  [name], DOB [birth year], MRN [id number] PCP: Morgan Montana, MD  [facility] Providers Cardiologist:  Morgan Magic, MD    History of Present Illness: .   [name] is a 72 y.o. female with a past medical history of type 2 diabetes, hypothyroidism, hypertension, depression, GERD, CKD stage III, fibromyalgia, PACs, history of CVA. Patient is followed by Dr. Mayford [name] and presents today for an annual follow up appointment   Per chart review, patient previously underwent echocardiogram on 09/08/17 that showed EF 65-70%, moderate LVH, no regional wall motion abnormalities, grade 1 diastolic dysfunction.  She also wore cardiac monitor in 09/2017 that showed normal sinus rhythm with frequent PACs.  Later had a stroke in 11/2017.  Underwent TEE on 11/23/17 that showed EF 55-60%, mild AI, mild MR, no defect or patent foramen ovale identified.  There was no cardiac source of emboli on TEE.  Patient was seen in the ER in 11/2019 with chest pain that started while she was driving her car.  She underwent coronary CTA on 01/13/2020 that showed a coronary calcium score 506, mild CAD with 25-49% mRCA and moderate CAD with 50-69% sequential mRCA, 25-49% inferior subbranch of D1 and 25-49% and P LCx.  CT FFR flow analysis demonstrated no hemodynamically significant flow-limiting lesions.  She was managed medically  Patient was last seen by cardiology on 06/09/2022.  At that time, patient denied chest pain or pressure, shortness of breath, DOE, PND, orthopnea.  Remained on aspirin 81 mg daily, statin therapy.  Also remained on amlodipine 2.5 mg, irbesartan/HCTZ 300-12.5 mg daily, Toprol XL 25 mg daily.  Today, patient presents for her annual follow up appointment. Reports that she has been having intermittent episodes of sudden heat, sweating, and near syncope occurring approximately once or twice a month for the past year. These episodes are not associated  with any specific activity or position and are not accompanied by chest pain or palpitations. The patient has experienced one episode of syncope in the past year, during 04/2023, during which she collapsed in her kitchen. She denies palpitations, ankle edema, dizziness, shortness of breath, chest pain.   Patient's physical activity is somewhat limited. She has difficulty walking due to hip pain, which she attributes to arthritis. She is currently managing the pain with gabapentin and occasional hydrocodone, though there are plans to discontinue the latter.The patient has a history of knee surgeries, which have been successful and are not currently causing any issues. She also reports having had three rotator cuff surgeries due to injuries sustained during her youth.  The patient's diabetes has reportedly been difficult to control recently, and there are plans to start her on an insulin pump. She has not reported any specific symptoms related to her diabetes during the consultation.   ROS: denies chest pain, shortness of breath, ankle edema, palpitations. Has had episodes of near syncope and one episode of syncope in 04/2023.   Studies Reviewed: .   Cardiac Studies & Procedures       ECHOCARDIOGRAM  ECHOCARDIOGRAM COMPLETE 09/08/2017  Narrative *[facility] [address] [place], [place] [address] [phone]  ------------------------------------------------------------------- Transthoracic Echocardiography  Patient:    [name], [name] MR #:       [id number] Study Date: 09/08/2017 Gender:     F Age:        66 Height:     160 cm Weight:  CVA 436.  ------------------------------------------------------------------- Study Conclusions  - Left ventricle: The cavity size was normal. Wall thickness was normal. Systolic function was normal. The estimated ejection fraction was in the range of 55% to 60%. - Aortic valve: No evidence of vegetation. There was mild regurgitation. - Mitral valve: No evidence of vegetation. There was mild regurgitation. - Left atrium: No evidence of thrombus in the appendage. - Right atrium: No evidence of thrombus in the atrial cavity or appendage. - Atrial septum: No defect or patent foramen ovale was identified. Echo contrast study showed no right-to-left atrial level shunt, following an increase in RA pressure induced by provocative maneuvers. - Tricuspid valve: No evidence of vegetation. - Pulmonic valve: No evidence of vegetation. - Superior vena cava: The study excluded a thrombus.  Impressions:  - No  cardiac source of emboli was indentified.  ------------------------------------------------------------------- Study data:   Study status:  Routine.  Consent:  The risks, benefits, and alternatives to the procedure were explained to the patient and informed consent was obtained.  Procedure:  The patient reported no pain pre or post test. Initial setup. The patient was brought to the laboratory. Surface ECG leads were monitored. Sedation. Conscious sedation was administered by anesthesiology staff. Transesophageal echocardiography. An adult multiplane transesophageal probe was inserted by the attending cardiologistwithout difficulty. Image quality was adequate.  Study completion:  The patient tolerated the procedure well. There were no complications.  Administered medications:   Propofol. Diagnostic transesophageal echocardiography.  2D and color Doppler. Birthdate:  Patient birthdate: 1951-03-30.  Age:  Patient is 72 yr old.  Sex:  Gender: female.    BMI: 36.2 kg/m^2.  Blood pressure: 97/45  Patient status:  Inpatient.  Study date:  Study date: 11/23/2017. Study time: 01:01 PM.  Location:  Endoscopy.  -------------------------------------------------------------------  ------------------------------------------------------------------- Left ventricle:  The cavity size was normal. Wall thickness was normal. Systolic function was normal. The estimated ejection fraction was in the range of 55% to 60%.  ------------------------------------------------------------------- Aortic valve:   Mildly thickened, mildly calcified leaflets. Cusp separation was normal.  No evidence of vegetation.  Doppler:  There was mild regurgitation.  ------------------------------------------------------------------- Aorta:  The aorta was not dilated, mildly calcified, and non-diseased.  ------------------------------------------------------------------- Mitral valve:   Structurally normal valve.   Leaflet  separation was normal.  No evidence of vegetation.  Doppler:  There was mild regurgitation.  ------------------------------------------------------------------- Left atrium:  The atrium was normal in size.  No evidence of thrombus in the appendage. The appendage was of normal size. Emptying velocity was normal.  ------------------------------------------------------------------- Atrial septum:  No defect or patent foramen ovale was identified. Echo contrast study showed no right-to-left atrial level shunt, following an increase in RA pressure induced by provocative maneuvers.  ------------------------------------------------------------------- Right ventricle:  The cavity size was normal. Wall thickness was normal. Systolic function was normal.  ------------------------------------------------------------------- Pulmonic valve:    Structurally normal valve.   Cusp separation was normal.  No evidence of vegetation.  ------------------------------------------------------------------- Tricuspid valve:   Structurally normal valve.   Leaflet separation was normal.  No evidence of vegetation.  Doppler:  There was mild regurgitation.  ------------------------------------------------------------------- Pulmonary artery:   The main pulmonary artery was normal-sized.  ------------------------------------------------------------------- Right atrium:  The atrium was normal in size.  No evidence of thrombus in the atrial cavity or appendage.  ------------------------------------------------------------------- Pericardium:  The pericardium was normal in appearance. There was no pericardial effusion.  ------------------------------------------------------------------- Systemic veins: Superior vena cava: The study excluded a thrombus.  ------------------------------------------------------------------- Post procedure conclusions Ascending Aorta:  -  Cardiology Office Note:  .   Date:  05/26/2023  ID:  [name], DOB [birth year], MRN [id number] PCP: Morgan Montana, MD  [facility] Providers Cardiologist:  Morgan Magic, MD    History of Present Illness: .   [name] is a 72 y.o. female with a past medical history of type 2 diabetes, hypothyroidism, hypertension, depression, GERD, CKD stage III, fibromyalgia, PACs, history of CVA. Patient is followed by Dr. Mayford [name] and presents today for an annual follow up appointment   Per chart review, patient previously underwent echocardiogram on 09/08/17 that showed EF 65-70%, moderate LVH, no regional wall motion abnormalities, grade 1 diastolic dysfunction.  She also wore cardiac monitor in 09/2017 that showed normal sinus rhythm with frequent PACs.  Later had a stroke in 11/2017.  Underwent TEE on 11/23/17 that showed EF 55-60%, mild AI, mild MR, no defect or patent foramen ovale identified.  There was no cardiac source of emboli on TEE.  Patient was seen in the ER in 11/2019 with chest pain that started while she was driving her car.  She underwent coronary CTA on 01/13/2020 that showed a coronary calcium score 506, mild CAD with 25-49% mRCA and moderate CAD with 50-69% sequential mRCA, 25-49% inferior subbranch of D1 and 25-49% and P LCx.  CT FFR flow analysis demonstrated no hemodynamically significant flow-limiting lesions.  She was managed medically  Patient was last seen by cardiology on 06/09/2022.  At that time, patient denied chest pain or pressure, shortness of breath, DOE, PND, orthopnea.  Remained on aspirin 81 mg daily, statin therapy.  Also remained on amlodipine 2.5 mg, irbesartan/HCTZ 300-12.5 mg daily, Toprol XL 25 mg daily.  Today, patient presents for her annual follow up appointment. Reports that she has been having intermittent episodes of sudden heat, sweating, and near syncope occurring approximately once or twice a month for the past year. These episodes are not associated  with any specific activity or position and are not accompanied by chest pain or palpitations. The patient has experienced one episode of syncope in the past year, during 04/2023, during which she collapsed in her kitchen. She denies palpitations, ankle edema, dizziness, shortness of breath, chest pain.   Patient's physical activity is somewhat limited. She has difficulty walking due to hip pain, which she attributes to arthritis. She is currently managing the pain with gabapentin and occasional hydrocodone, though there are plans to discontinue the latter.The patient has a history of knee surgeries, which have been successful and are not currently causing any issues. She also reports having had three rotator cuff surgeries due to injuries sustained during her youth.  The patient's diabetes has reportedly been difficult to control recently, and there are plans to start her on an insulin pump. She has not reported any specific symptoms related to her diabetes during the consultation.   ROS: denies chest pain, shortness of breath, ankle edema, palpitations. Has had episodes of near syncope and one episode of syncope in 04/2023.   Studies Reviewed: .   Cardiac Studies & Procedures       ECHOCARDIOGRAM  ECHOCARDIOGRAM COMPLETE 09/08/2017  Narrative *[facility]* [address] [place], [place] [address] [phone]  ------------------------------------------------------------------- Transthoracic Echocardiography  Patient:    [name], [name] MR #:       [id number] Study Date: 09/08/2017 Gender:     F Age:        66 Height:     160 cm Weight:

## 2023-05-26 ENCOUNTER — Ambulatory Visit: Payer: Medicare Other | Attending: Cardiology

## 2023-05-26 ENCOUNTER — Ambulatory Visit: Payer: Medicare Other | Attending: Cardiology | Admitting: Cardiology

## 2023-05-26 ENCOUNTER — Encounter: Payer: Self-pay | Admitting: Cardiology

## 2023-05-26 VITALS — BP 114/66 | HR 94 | Ht 63.0 in | Wt 211.8 lb

## 2023-05-26 DIAGNOSIS — R55 Syncope and collapse: Secondary | ICD-10-CM

## 2023-05-26 DIAGNOSIS — I2510 Atherosclerotic heart disease of native coronary artery without angina pectoris: Secondary | ICD-10-CM | POA: Diagnosis not present

## 2023-05-26 DIAGNOSIS — I10 Essential (primary) hypertension: Secondary | ICD-10-CM | POA: Diagnosis not present

## 2023-05-26 DIAGNOSIS — I491 Atrial premature depolarization: Secondary | ICD-10-CM | POA: Diagnosis not present

## 2023-05-26 DIAGNOSIS — E7800 Pure hypercholesterolemia, unspecified: Secondary | ICD-10-CM

## 2023-05-26 NOTE — Progress Notes (Unsigned)
Enrolled for [employer] to mail a ZIO XT long term holter monitor to the patients address on file.   Dr. Turner to read. 

## 2023-05-26 NOTE — Patient Instructions (Signed)
Medication Instructions:  No changes *If you need a refill on your cardiac medications before your next appointment, please call your pharmacy*   Lab Work: No labs   Testing/Procedures:  Your physician has requested that you have an echocardiogram. Echocardiography is a painless test that uses sound waves to create images of your heart. It provides your doctor with information about the size and shape of your heart and how well your heart's chambers and valves are working. This procedure takes approximately one hour. There are no restrictions for this procedure. Please do NOT wear cologne, perfume, aftershave, or lotions (deodorant is allowed). Please arrive 15 minutes prior to your appointment time.   ZIO XT- Long Term Monitor Instructions  Your physician has requested you wear a ZIO patch monitor for 14 days.  This is a single patch monitor. [employer] supplies one patch monitor per enrollment. Additional stickers are not available. Please do not apply patch if you will be having a Nuclear Stress Test,  Echocardiogram, Cardiac CT, MRI, or Chest Xray during the period you would be wearing the  monitor. The patch cannot be worn during these tests. You cannot remove and re-apply the  ZIO XT patch monitor.  Your ZIO patch monitor will be mailed 3 day USPS to your address on file. It may take 3-5 days  to receive your monitor after you have been enrolled.  Once you have received your monitor, please review the enclosed instructions. Your monitor  has already been registered assigning a specific monitor serial # to you.  Billing and Patient Assistance Program Information  We have supplied [employer] with any of your insurance information on file for billing purposes. [employer] offers a sliding scale Patient Assistance Program for patients that do not have  insurance, or whose insurance does not completely cover the cost of the ZIO monitor.  You must apply for the Patient Assistance Program to  qualify for this discounted rate.  To apply, please call [employer] at [phone], select option 4, select option 2, ask to apply for  Patient Assistance Program. Meredeth Ide will ask your household income, and how many people  are in your household. They will quote your out-of-pocket cost based on that information.  [employer] will also be able to set up a 55-month, interest-free payment plan if needed.  Applying the monitor   Shave hair from upper left chest.  Hold abrader disc by orange tab. Rub abrader in 40 strokes over the upper left chest as  indicated in your monitor instructions.  Clean area with 4 enclosed alcohol pads. Let dry.  Apply patch as indicated in monitor instructions. Patch will be placed under collarbone on left  side of chest with arrow pointing upward.  Rub patch adhesive wings for 2 minutes. Remove white label marked "1". Remove the white  label marked "2". Rub patch adhesive wings for 2 additional minutes.  While looking in a mirror, press and release button in center of patch. A small green light will  flash 3-4 times. This will be your only indicator that the monitor has been turned on.  Do not shower for the first 24 hours. You may shower after the first 24 hours.  Press the button if you feel a symptom. You will hear a small click. Record Date, Time and  Symptom in the Patient Logbook.  When you are ready to remove the patch, follow instructions on the last 2 pages of Patient  Logbook. Stick patch monitor onto the last page of Patient  Logbook.  Place Patient Logbook in the blue and white box. Use locking tab on box and tape box closed  securely. The blue and white box has prepaid postage on it. Please place it in the mailbox as  soon as possible. Your physician should have your test results approximately 7 days after the  monitor has been mailed back to [facility].  Call [facility] Customer Care at [phone] if you have questions regarding  your ZIO XT  patch monitor. Call them immediately if you see an orange light blinking on your  monitor.  If your monitor falls off in less than 4 days, contact our Monitor department at [phone].  If your monitor becomes loose or falls off after 4 days call [employer] at [phone] for  suggestions on securing your monitor  Follow-Up: At [facility], you and your health needs are our priority.  As part of our continuing mission to provide you with exceptional heart care, we have created designated Provider Care Teams.  These Care Teams include your primary Cardiologist (physician) and Advanced Practice Providers (APPs -  Physician Assistants and Nurse Practitioners) who all work together to provide you with the care you need, when you need it.  We recommend signing up for the patient portal called "MyChart".  Sign up information is provided on this After Visit Summary.  MyChart is used to connect with patients for Virtual Visits (Telemedicine).  Patients are able to view lab/test results, encounter notes, upcoming appointments, etc.  Non-urgent messages can be sent to your provider as well.   To learn more about what you can do with MyChart, go to [email or web address].    Your next appointment:   2 month(s)  Provider:   Robet Leu, PA-C

## 2023-06-02 DIAGNOSIS — R55 Syncope and collapse: Secondary | ICD-10-CM | POA: Diagnosis not present

## 2023-06-21 ENCOUNTER — Telehealth: Payer: Self-pay

## 2023-06-21 NOTE — Telephone Encounter (Signed)
-----   Message from Jonita Albee sent at 06/21/2023 12:31 PM EST ----- Please tell patient that their cardiac monitor showed predominantly normal sinus rhythm with average heart rate 83 BPM. No arrhythmias noted. Nothing on monitor to suggest a cause for her passing out.   Thanks [employer]

## 2023-06-21 NOTE — Telephone Encounter (Signed)
Called patient advised of below they verbalized understanding.

## 2023-06-26 ENCOUNTER — Other Ambulatory Visit: Payer: Self-pay | Admitting: Cardiology

## 2023-06-27 ENCOUNTER — Ambulatory Visit (HOSPITAL_COMMUNITY): Payer: Medicare Other | Attending: Cardiovascular Disease

## 2023-06-27 DIAGNOSIS — R55 Syncope and collapse: Secondary | ICD-10-CM | POA: Diagnosis present

## 2023-06-27 LAB — ECHOCARDIOGRAM COMPLETE
Area-P 1/2: 3.45 cm2
P 1/2 time: 357 ms
S' Lateral: 2.4 cm

## 2023-06-28 ENCOUNTER — Telehealth: Payer: Self-pay

## 2023-06-28 NOTE — Telephone Encounter (Signed)
-----   Message from Jonita Albee sent at 06/27/2023  7:07 PM EST ----- Please tell patient that their echocardiogram showed EF normal at 65-70%. There were no wall motion abnormalities. Heart muscle mildly thickened, which can occur with age. The RV functioning normally. There is a little bit of calcium on the aortic valve, but it is not affecting blood flow through the valve. Nothing on echo to suggest a cause of her dizziness, feeling like she might pass out  Thanks KJ

## 2023-06-28 NOTE — Telephone Encounter (Signed)
Called patient advised of below they verbalized understanding.

## 2023-07-11 NOTE — Progress Notes (Unsigned)
Cardiology Office Note:  .   Date:  07/12/2023  ID:  [name], DOB October 29, 1950, MRN [id number] PCP: [name], MD  [facility] Providers Cardiologist:  Armanda Magic, MD   History of Present Illness: .   [name] is a 72 y.o. female  with a past medical history of type 2 diabetes, hypothyroidism, hypertension, depression, GERD, CKD stage III, fibromyalgia, PACs, history of CVA. Patient is followed by [name] and presents today for a follow up appointment.   Per chart review, patient previously underwent echocardiogram on 09/08/17 that showed EF 65-70%, moderate LVH, no regional wall motion abnormalities, grade 1 diastolic dysfunction.  She also wore cardiac monitor in 09/2017 that showed normal sinus rhythm with frequent PACs.  Later had a stroke in 11/2017.  Underwent TEE on 11/23/17 that showed EF 55-60%, mild AI, mild MR, no defect or patent foramen ovale identified.  There was no cardiac source of emboli on TEE.   Patient was seen in the ER in 11/2019 with chest pain that started while she was driving her car.  She underwent coronary CTA on 01/13/2020 that showed a coronary calcium score 506, mild CAD with 25-49% mRCA and moderate CAD with 50-69% sequential mRCA, 25-49% inferior subbranch of D1 and 25-49% and P LCx.  CT FFR flow analysis demonstrated no hemodynamically significant flow-limiting lesions.  She was managed medically   Patient was last seen by cardiology on 06/09/2022.  At that time, patient denied chest pain or pressure, shortness of breath, DOE, PND, orthopnea.  Remained on aspirin 81 mg daily, statin therapy.  Also remained on amlodipine 2.5 mg, irbesartan/HCTZ 300-12.5 mg daily, Toprol XL 25 mg daily.  I saw patient in the clinic on 05/26/23, and she reported having recurrent episodes of near syncope and one episode of syncope.  Patient wore a cardiac monitor that showed NSR with HR ranging between 63-127 BPM, rare PACs, PVCs. She also underwent echocardiogram  that showed EF 65-70%, no wall motion abnormalities, grade I DD, normal RV function, normal PA systolic pressure.   Today, patient reports that she has been doing well from a cardiac perspective. She denies recurrent syncope, near syncope. She notes that in the past, she has passed out many times. When she was younger, she often passed out at the site of blood, needles, or due to the smell of hospitals. More recently, she had been having episodes of near syncope associated with feeling hot, like a hot flash. She has not had any of these episodes since she was seen in clinic about 1.5 months ago. Monitor and echocardiogram were unrevealing. Denies chest pain, palpitations, shortness of breath, ankle swelling. She is compliant with her medications. Notes that she has been having wide spread body aches for quite some time now. Heard on the radio yesterday that statins can sometimes cause body aches. Wonders if she should try Co Q 10 supplements.     ROS: Denies chest pain, palpitations, syncope, near syncope, dizziness, ankle swelling, shortness of breath   Studies Reviewed: .   Cardiac Studies & Procedures       ECHOCARDIOGRAM  ECHOCARDIOGRAM COMPLETE 06/27/2023  Narrative ECHOCARDIOGRAM REPORT    Patient Name:   [name]: 06/27/2023 Medical Rec #:  [id number]     Height:       63.0 in Accession #:    [id number]    Weight:       211.8 lb Date of Birth:  10-03-1950  BSA:          1.981 m Patient Age:    72 years      BP:           114/66 mmHg Patient Gender: F             HR:           90 bpm. [name] Location:  [address]  Procedure: 2D Echo, Cardiac Doppler and Color Doppler  Indications:    R55 Syncope  History:        Patient has prior history of Echocardiogram examinations, most recent 09/08/2017. CAD, CVA; Risk Factors:Hypertension, Diabetes and Dyslipidemia.  Sonographer:    Daphine Deutscher RDCS Referring Phys: [id number] Jonita Albee  IMPRESSIONS   1. Left ventricular ejection fraction, by estimation, is 65 to 70%. The left ventricle has normal function. The left ventricle has no regional wall motion abnormalities. There is mild concentric left ventricular hypertrophy. Left ventricular diastolic parameters are consistent with Grade I diastolic dysfunction (impaired relaxation). 2. Right ventricular systolic function is normal. The right ventricular size is normal. There is normal pulmonary artery systolic pressure. The estimated right ventricular systolic pressure is 21.5 mmHg. 3. Left atrial size was moderately dilated. 4. Right atrial size was mildly dilated. 5. The mitral valve is degenerative. No evidence of mitral valve regurgitation. No evidence of mitral stenosis. Moderate mitral annular calcification. 6. The aortic valve is tricuspid. There is mild calcification of the aortic valve. There is mild thickening of the aortic valve. Aortic valve regurgitation is trivial. Aortic valve sclerosis/calcification is present, without any evidence of aortic stenosis.  Comparison(s): Prior images unable to be directly viewed, comparison made by report only.  FINDINGS Left Ventricle: Left ventricular ejection fraction, by estimation, is 65 to 70%. The left ventricle has normal function. The left ventricle has no regional wall motion abnormalities. The left ventricular internal cavity size was normal in size. There is mild concentric left ventricular hypertrophy. Left ventricular diastolic parameters are consistent with Grade I diastolic dysfunction (impaired relaxation). Normal left ventricular filling pressure.  Right Ventricle: The right ventricular size is normal. No increase in right ventricular wall thickness. Right ventricular systolic function is normal. There is normal pulmonary artery systolic pressure. The tricuspid regurgitant velocity is 2.15 m/s, and with an assumed right atrial pressure of 3 mmHg, the  estimated right ventricular systolic pressure is 21.5 mmHg.  Left Atrium: Left atrial size was moderately dilated.  Right Atrium: Right atrial size was mildly dilated.  Pericardium: There is no evidence of pericardial effusion.  Mitral Valve: The mitral valve is degenerative in appearance. Moderate mitral annular calcification. No evidence of mitral valve regurgitation. No evidence of mitral valve stenosis.  Tricuspid Valve: The tricuspid valve is normal in structure. Tricuspid valve regurgitation is trivial.  Aortic Valve: The aortic valve is tricuspid. There is mild calcification of the aortic valve. There is mild thickening of the aortic valve. Aortic valve regurgitation is trivial. Aortic regurgitation PHT measures 357 msec. Aortic valve sclerosis/calcification is present, without any evidence of aortic stenosis.  Pulmonic Valve: The pulmonic valve was normal in structure. Pulmonic valve regurgitation is not visualized. No evidence of pulmonic stenosis.  Aorta: The aortic root and ascending aorta are structurally normal, with no evidence of dilitation.  IAS/Shunts: No atrial level shunt detected by color flow Doppler.   LEFT VENTRICLE PLAX 2D LVIDd:         3.80 cm   Diastology LVIDs:  2.40 cm   LV e' medial:    7.56 cm/s LV PW:         1.20 cm   LV E/e' medial:  7.2 LV IVS:        1.20 cm   LV e' lateral:   7.34 cm/s LVOT diam:     1.90 cm   LV E/e' lateral: 7.4 LV SV:         60 LV SV Index:   30 LVOT Area:     2.84 cm   RIGHT VENTRICLE             IVC RV Basal diam:  4.00 cm     IVC diam: 1.70 cm RV S prime:     15.75 cm/s TAPSE (M-mode): 3.0 cm  LEFT ATRIUM             Index        RIGHT ATRIUM           Index LA diam:        4.60 cm 2.32 cm/m   RA Area:     18.00 cm LA Vol (A2C):   43.4 ml 21.90 ml/m  RA Volume:   55.40 ml  27.96 ml/m LA Vol (A4C):   29.6 ml 14.94 ml/m LA Biplane Vol: 36.1 ml 18.22 ml/m AORTIC VALVE LVOT Vmax:   111.67 cm/s LVOT  Vmean:  76.567 cm/s LVOT VTI:    0.211 m AI PHT:      357 msec  AORTA Ao Root diam: 2.90 cm Ao Asc diam:  3.40 cm  MITRAL VALVE               TRICUSPID VALVE MV Area (PHT): 3.45 cm    TR Peak grad:   18.5 mmHg MV Decel Time: 220 msec    TR Vmax:        215.00 cm/s MV E velocity: 54.10 cm/s MV A velocity: 98.20 cm/s  SHUNTS MV E/A ratio:  0.55        Systemic VTI:  0.21 m Systemic Diam: 1.90 cm  Thurmon Fair MD Electronically signed by Thurmon Fair MD Signature Date/Time: 06/27/2023/5:22:13 PM    Final   TEE  ECHO TEE 11/23/2017  Narrative *[place]* *[facility]* [address] [place], [place] [address] [phone]  ------------------------------------------------------------------- Transesophageal Echocardiography  Patient:    Collin, Gomes MR #:       [id number] Study Date: 11/23/2017 Gender:     F Age:        22 Height:     160 cm Weight:     92.7 kg BSA:        2.07 m^2 Pt. Status: Room:       2M16C  SONOGRAPHER  Perley Jain, RDCS PERFORMING   Donato Schultz, M.D. ATTENDING    Loren Racer [id number] Jerene Canny, [place] REFERRING    [facility], [place]oe [place]uther[place] ADMITTING    Jamesetta So J  cc:  ------------------------------------------------------------------- LV EF: 55% -   60%  ------------------------------------------------------------------- Indications:      CVA 436.  ------------------------------------------------------------------- Study Conclusions  - Left ventricle: The cavity size was normal. Wall thickness was normal. Systolic function was normal. The estimated ejection fraction was in the range of 55% to 60%. - Aortic valve: No evidence of vegetation. There was mild regurgitation. - Mitral valve: No evidence of vegetation. There was mild regurgitation. - Left atrium: No evidence of thrombus in the appendage. - Right  atrium: No evidence of thrombus in the atrial cavity or appendage. -  Atrial septum: No defect or patent foramen ovale was identified. Echo contrast study showed no right-to-left atrial level shunt, following an increase in RA pressure induced by provocative maneuvers. - Tricuspid valve: No evidence of vegetation. - Pulmonic valve: No evidence of vegetation. - Superior vena cava: The study excluded a thrombus.  Impressions:  - No cardiac source of emboli was indentified.  ------------------------------------------------------------------- Study data:   Study status:  Routine.  Consent:  The risks, benefits, and alternatives to the procedure were explained to the patient and informed consent was obtained.  Procedure:  The patient reported no pain pre or post test. Initial setup. The patient was brought to the laboratory. Surface ECG leads were monitored. Sedation. Conscious sedation was administered by anesthesiology staff. Transesophageal echocardiography. An adult multiplane transesophageal probe was inserted by the attending cardiologistwithout difficulty. Image quality was adequate.  Study completion:  The patient tolerated the procedure well. There were no complications.  Administered medications:   Propofol. Diagnostic transesophageal echocardiography.  2D and color Doppler. Birthdate:  Patient birthdate: 12/28/1950.  Age:  Patient is 71 yr old.  Sex:  Gender: female.    BMI: 36.2 kg/m^2.  Blood pressure: 97/45  Patient status:  Inpatient.  Study date:  Study date: 11/23/2017. Study time: 01:01 PM.  Location:  Endoscopy.  -------------------------------------------------------------------  ------------------------------------------------------------------- Left ventricle:  The cavity size was normal. Wall thickness was normal. Systolic function was normal. The estimated ejection fraction was in the range of 55% to 60%.  ------------------------------------------------------------------- Aortic valve:   Mildly thickened, mildly calcified  leaflets. Cusp separation was normal.  No evidence of vegetation.  Doppler:  There was mild regurgitation.  ------------------------------------------------------------------- Aorta:  The aorta was not dilated, mildly calcified, and non-diseased.  ------------------------------------------------------------------- Mitral valve:   Structurally normal valve.   Leaflet separation was normal.  No evidence of vegetation.  Doppler:  There was mild regurgitation.  ------------------------------------------------------------------- Left atrium:  The atrium was normal in size.  No evidence of thrombus in the appendage. The appendage was of normal size. Emptying velocity was normal.  ------------------------------------------------------------------- Atrial septum:  No defect or patent foramen ovale was identified. Echo contrast study showed no right-to-left atrial level shunt, following an increase in RA pressure induced by provocative maneuvers.  ------------------------------------------------------------------- Right ventricle:  The cavity size was normal. Wall thickness was normal. Systolic function was normal.  ------------------------------------------------------------------- Pulmonic valve:    Structurally normal valve.   Cusp separation was normal.  No evidence of vegetation.  ------------------------------------------------------------------- Tricuspid valve:   Structurally normal valve.   Leaflet separation was normal.  No evidence of vegetation.  Doppler:  There was mild regurgitation.  ------------------------------------------------------------------- Pulmonary artery:   The main pulmonary artery was normal-sized.  ------------------------------------------------------------------- Right atrium:  The atrium was normal in size.  No evidence of thrombus in the atrial cavity or appendage.  ------------------------------------------------------------------- Pericardium:   The pericardium was normal in appearance. There was no pericardial effusion.  ------------------------------------------------------------------- Systemic veins: Superior vena cava: The study excluded a thrombus.  ------------------------------------------------------------------- Post procedure conclusions Ascending Aorta:  - The aorta was not dilated, mildly calcified, and non-diseased.  ------------------------------------------------------------------- Prepared and Electronically Authenticated by  Donato Schultz, M.D. 2019-04-18T14:57:41   MONITORS  LONG TERM MONITOR (3-14 DAYS) 06/19/2023  Narrative   Predominant rhythm was normal sinus rhythm with average heart rate 83bpm (range 63 to 127bpm)   Rare PACs, atrial couplets and triplets   Rare PVCs and trigeminal PVCs  Patch Wear Time:  2 days and 19 hours (2024-10-25T19:34:27-0400 to 2024-10-28T15:28:18-0400)  Patient had a min HR of 63 bpm, max HR of 127 bpm, and avg HR of 83 bpm. Predominant underlying rhythm was Sinus Rhythm. Isolated SVEs were rare (<1.0%), SVE Couplets were rare (<1.0%), and SVE Triplets were rare (<1.0%). Isolated VEs were rare (<1.0%), VE Couplets were rare (<1.0%), and no VE Triplets were present. Ventricular Trigeminy was present.   CT SCANS  CT CORONARY MORPH W/CTA COR W/SCORE 01/13/2020  Addendum 01/13/2020  5:28 PM ADDENDUM REPORT: 01/13/2020 17:25  [name]: Cardiac/Coronary  CT  TECHNIQUE: The patient was scanned on a [employer].  FINDINGS: A 120 kV prospective scan was triggered in the descending thoracic aorta at 111 HU's. Axial non-contrast 3 mm slices were carried out through the heart. The data set was analyzed on a dedicated work station and scored using the Agatson method. Gantry rotation speed was 250 msecs and collimation was .6 mm. No beta blockade and 0.8 mg of sl NTG was given. The 3D data set was reconstructed in 5% intervals of the 67-82 % of the R-R cycle.  Diastolic phases were analyzed on a dedicated work station using MPR, MIP and VRT modes. The patient received 80 cc of contrast.  Aorta:  Normal size.  Scattered calcifications.  No dissection.  Aortic Valve:  Trileaflet.  No calcifications.  Mitral Valve: Marked posterior mitral annular calcifications.  Coronary Arteries:  Normal coronary origin.  Right dominance.  RCA is a large dominant artery that gives rise to PDA and PLVB. There is mild calcified plaque in the mid RCA with associated stenosis of 25-49% and sequential mid RCA lesion with moderate mixed plaque with positive remodeling with associated stenosis of 50-69%.  Left main is a large artery that gives rise to LAD and LCX arteries. There is no plaque.  LAD is a large vessel that gives rise to a large branching D1. There is minimal calcified plaque in the proximal LAD with associated stenosis of 0-24%. There is mild calcified plaque in the inferior sub branch of the Diagonal with associated stenosis of 25-49%.  LCX is a non-dominant artery that gives rise to one large OM1 branch. There is mild mixed plaque with positive remodeling in the proximal LCx with associated stenosis of 25-49%.  Other findings:  Normal pulmonary vein drainage into the left atrium.  Normal let atrial appendage without a thrombus.  Normal size of the pulmonary artery.  IMPRESSION: 1. Coronary calcium score of 506. This was 94th percentile for age and sex matched control.  2.  Normal coronary origin with right dominance.  3.  Moderate atherosclerosis.  CAD-RADS=3.  4. Consider symptom-guided anti-ischemic and preventive pharmacotherapy as well as risk factor modification per guideline-directed care.  5.  Consider functional ischemia assessment.  6.  This study has been submitted for FFR analysis.  Armanda Magic   Electronically Signed By: Armanda Magic On: 01/13/2020 17:25  Narrative [name]: OVER-READ INTERPRETATION  CT  CHEST  The following report is an over-read performed by radiologist Dr. Charlett Nose of [facility] on 01/13/2020. This over-read does not include interpretation of cardiac or coronary anatomy or pathology. The coronary CTA interpretation by the cardiologist is attached.  COMPARISON:  None.  FINDINGS: Vascular: Heart is normal size. Aorta normal caliber. Scattered calcifications in the aortic root and descending thoracic aorta.  Mediastinum/Nodes: No adenopathy  Lungs/Pleura: No confluent opacities or effusions.  Upper Abdomen: Imaging into the upper abdomen shows  no acute findings.  Musculoskeletal: Chest wall soft tissues are unremarkable. No acute bony abnormality.  IMPRESSION: Aortic atherosclerosis.  No acute extra cardiac abnormality.  Electronically Signed: By: Charlett Nose M.D. On: 01/13/2020 10:48          Risk Assessment/Calculations:             Physical [name]:   VS:  BP 132/74 (BP Location: Left Arm, Patient Position: Sitting, Cuff Size: Normal)   Pulse 74   Ht 5\' 4"  (1.626 m)   Wt 216 lb 9.6 oz (98.2 kg)   SpO2 95%   BMI 37.18 kg/m    Wt Readings from Last 3 Encounters:  07/12/23 216 lb 9.6 oz (98.2 kg)  05/26/23 211 lb 12.8 oz (96.1 kg)  01/16/23 211 lb 8 oz (95.9 kg)    GEN: Well nourished, well developed in no acute distress. Sitting comfortably in the chair  NECK: No JVD CARDIAC: RRR, no murmurs, rubs, gallops. Radial pulses 2+ bilaterally  RESPIRATORY:  Clear to auscultation without rales, wheezing or rhonchi. Normal work of breathing on room air  ABDOMEN: Soft, non-tender, non-distended EXTREMITIES:  No edema in BLE. No deformity   ASSESSMENT AND PLAN: .    Syncope Near Syncope  - At her appointment in 05/2023, patient reported that she had been having 1-2 episodes of near-syncope per month. Reported randomly feeling very hot, getting sweaty, and feeling like she might pass out. She had one episode of syncope in 04/2023. K  5.1, hemoglobin 12.7, TSH normal in 03/2023.   - Cardiac monitor from 06/2023 showed no arrhythmias. Echo from 06/27/23 showed EF 65-70%, no wall motion abnormalities, grade I DD, no significant valvular abnormalities  - Today, patient denies any recurrence of near syncope or syncope. Overall has been feeling well  - Reminded patient that per the Department of Transportation guidelines, patients with unexplained syncope should not drive for 6 months.    Nonobstructive CAD  -Coronary CTA from 01/2020 showed coronary calcium score 506 (94th percentile) moderate atherosclerosis.  Study was sent for [facility] which demonstrated no hemodynamically significant flow-limiting lesions - Patient denies chest pain, DOE  - Continue aspirin 81 mg daily, Crestor 20 mg daily, metoprolol succinate 25 mg daily, amlodipine 2.5 mg daily    HTN  - BP well controlled. Denies symptoms of hypotension  -Continue amlodipine 2.5 mg daily, irbesartan-hydrochlorothiazide 300-12.5 mg daily, metoprolol succinate 25 mg daily - K 5.1, creatinine 1.35 in 03/2023    Type 1 DM  -Followed by PCP. Patient reports that her blood sugar has been difficult to control recently and she is getting a pump  - A1c 8.6 in 04/2023    HLD  - Lipid panel from 12/2022 LDL 47, HDL 47, triglycerides 112, total cholesterol 114  - Continue crestor 20 mg daily  - Patient reports having wide-spread body aches for "a few years". Wonders if they could be related to her statin therapy. Discussed possible statin holiday vs trial of Co Q 10 supplementation, patient prefers to try Co Q 10 supplementation at this time    Dispo: Follow up in 6 months   Signed, Jonita Albee, PA-C

## 2023-07-12 ENCOUNTER — Ambulatory Visit: Payer: Medicare Other | Attending: Cardiology | Admitting: Cardiology

## 2023-07-12 ENCOUNTER — Encounter: Payer: Self-pay | Admitting: Cardiology

## 2023-07-12 VITALS — BP 132/74 | HR 74 | Ht 64.0 in | Wt 216.6 lb

## 2023-07-12 DIAGNOSIS — E109 Type 1 diabetes mellitus without complications: Secondary | ICD-10-CM

## 2023-07-12 DIAGNOSIS — E7800 Pure hypercholesterolemia, unspecified: Secondary | ICD-10-CM

## 2023-07-12 DIAGNOSIS — I10 Essential (primary) hypertension: Secondary | ICD-10-CM

## 2023-07-12 DIAGNOSIS — I2510 Atherosclerotic heart disease of native coronary artery without angina pectoris: Secondary | ICD-10-CM | POA: Diagnosis not present

## 2023-07-12 DIAGNOSIS — R55 Syncope and collapse: Secondary | ICD-10-CM

## 2023-07-12 NOTE — Patient Instructions (Signed)
Medication Instructions:  No changes *If you need a refill on your cardiac medications before your next appointment, please call your pharmacy*  Lab Work: No labs  Testing/Procedures: No testing   Follow-Up: At [facility], you and your health needs are our priority.  As part of our continuing mission to provide you with exceptional heart care, we have created designated Provider Care Teams.  These Care Teams include your primary Cardiologist (physician) and Advanced Practice Providers (APPs -  Physician Assistants and Nurse Practitioners) who all work together to provide you with the care you need, when you need it.  We recommend signing up for the patient portal called "MyChart".  Sign up information is provided on this After Visit Summary.  MyChart is used to connect with patients for Virtual Visits (Telemedicine).  Patients are able to view lab/test results, encounter notes, upcoming appointments, etc.  Non-urgent messages can be sent to your provider as well.   To learn more about what you can do with MyChart, go to [email or web address].    Your next appointment:   6 month(s)  Provider:   Robet Leu, PA-C

## 2023-08-15 ENCOUNTER — Other Ambulatory Visit: Payer: Self-pay | Admitting: Physician Assistant

## 2023-08-15 DIAGNOSIS — M5416 Radiculopathy, lumbar region: Secondary | ICD-10-CM

## 2023-08-18 ENCOUNTER — Ambulatory Visit
Admission: RE | Admit: 2023-08-18 | Discharge: 2023-08-18 | Disposition: A | Discharge status: Home / Self Care | Payer: Medicare Other | Source: Ambulatory Visit | Attending: Physician Assistant | Admitting: Physician Assistant

## 2023-08-18 ENCOUNTER — Other Ambulatory Visit: Payer: Medicare Other

## 2023-08-18 DIAGNOSIS — M5416 Radiculopathy, lumbar region: Secondary | ICD-10-CM

## 2023-09-01 ENCOUNTER — Other Ambulatory Visit: Payer: Self-pay | Admitting: Family Medicine

## 2023-09-01 DIAGNOSIS — M810 Age-related osteoporosis without current pathological fracture: Secondary | ICD-10-CM

## 2023-09-26 NOTE — Progress Notes (Signed)
 [address] [employer] [place] [address] [phone]  Follow-up   Subjective   Patient ID:  [name] is a 73 y.o. (DOB Nov 09, 1950) female.   CC:     Patient presents with  . Gastroesophageal Reflux     HPI:  The patient is a pleasant 73 year old female that has a history of hypertension, hypothyroid, fibromyalgia, DVT on Xarelto , GERD, multiple abdominal surgeries with previous hospitalization for small bowel obstruction.  Patient has history of colon adenomas with high-grade dysplasia and is status post right hemicolectomy.  She has been on pantoprazole  for GERD.  Colonoscopy 09/24/2021 with diverticulosis.  Last visit she was having some GERD, epigastric pain and dysphagia.  EGD was completed 04/28/2023 with grossly normal-appearing esophagus but empiric dilatation was performed.  There was some patchy erythema in the antrum.    Currently the patient reports she is taking pantoprazole  40 mg daily.  Denies any significant vomiting but does have some regurgitation and also trouble swallowing solids and liquids.  Her esophagus was dilated on EGD in September 2024 and it helped but only for a month or so and symptoms return.  No rectal bleeding, melena, hematemesis.  Bowels move most days.  Review of Systems:  Except as stated in the HPI, all other systems reviewed and are negative.   Past Medical History:  Diagnosis Date  . Colon polyp   . Depression   . Diabetes mellitus  (*)   . Disease of thyroid  gland   . Hyperlipidemia   . Hypertension    Social History   Socioeconomic History  . Marital status: Married  . Number of children: 2  Occupational History  . Occupation: Retired  Tobacco Use  . Smoking status: Never  . Smokeless tobacco: Never  Vaping Use  . Vaping status: Never Used  Substance and Sexual Activity  . Alcohol use: Not Currently  . Drug use: Never   Past Surgical History:  Procedure Laterality Date  . Appendectomy    . Cervical  spine surgery     x2  . Cesarean section  1977   and 1981  . Cholecystectomy    . Colon surgery    . Colonoscopy     Due 2022  . Hernia mesh removal    . Reduction mammaplasty    . Replacement total knee bilateral Bilateral    Right total Left partial  . Shoulder surgery Bilateral    3 on right 2 on left  . Spine surgery     x3 lower back  . Tubal ligation    . Tubaligation    . Upper gastrointestinal endoscopy  04/28/2023   Family History  Problem Relation Age of Onset  . Dementia Mother   . Alzheimer's disease Mother   . Breast cancer Mother   . Diabetes Father   . Heart attack Father   . Kidney failure Father   . Lung cancer Brother   . Heart attack Brother   . No Known Problems Son   . Breast cancer Maternal Aunt   . Diabetes Brother   . Hypertension Brother   . No Known Problems Son   . Breast cancer Maternal Aunt   . Breast cancer Cousin   . Breast cancer Cousin   . Colon cancer Neg Hx   . Colon polyps Neg Hx     Medications: Outpatient Medications Marked as Taking for the 10/03/23 encounter (Office Visit) with Helene LABOR Pleasant, PA  Medication Sig Dispense Refill  .  acetaminophen  (TYLENOL ) 500 mg tablet Take two tablets (1,000 mg dose) by mouth as needed.    . amLODIPine  besylate (NORVASC ) 5 mg tablet TAKE 1 TABLET BY MOUTH EVERY DAY    . aspirin  (ECOTRIN) 81 mg EC tablet Take by mouth.    . betamethasone dipropionate 0.05% lotion APPLY 1 APPLICATION TO THE SCALP DAILY TAPER USE AS ABLE    . cephALEXin  (KEFLEX ) 500 mg capsule Take one capsule (500 mg dose) by mouth daily.    . Cholecalciferol 10 MCG (400 UNIT) CHEW Chew by mouth.    . Cyanocobalamin  (B-12) 2500 MCG TABS Take by mouth daily.    . cyclobenzaprine  (FLEXERIL ) 10 mg tablet Take by mouth.    . desonide (TRIDESILONE) 0.05% cream APPLY AS DIRECTED 2 TIMES DAILY AS NEEDED FOR RASH    . diazepam  (VALIUM ) 5 mg tablet TAKE 1 TABLET 30 MINUTES PRIOR TO MRI. MAY REPEAT IF NEEDED    . docusate sodium   (COLACE,DOK,DOCQLACE) capsule Take one capsule (100 mg dose) by mouth 2 (two) times daily.    . DULoxetine  HCl (CYMBALTA ) 60 mg capsule TAKE ONE CAPSULE BY MOUTH EVERY DAY    . empagliflozin (JARDIANCE) 10 mg TABS tablet TAKE 1 TABLET BY MOUTH EVERY DAY FOR 30 DAYS    . escitalopram  (LEXAPRO ) 5 MG tablet 1 TABLET ORALLY DX: F33.1 ONCE A DAY 90 DAYS    . FLUoxetine (PROZAC) 20 MG tablet TAKE 1 TABLET BY MOUTH EVERY MORNING    . fluticasone  propionate (FLONASE ) 50 mcg/actuation nasal spray by Nasal route daily.    . furosemide  (LASIX ) 20 mg tablet TAKE 1 TO 2 TABLETS BY MOUTH EVERY MORNING AS NEEDED FOR SWELLING    . gabapentin  (NEURONTIN ) 300 mg capsule TAKE 1-2 CAPSULES BY ORAL ROUTE 3 TIMES EVERY DAY    . ibandronate (BONIVA) 150 mg tablet Take one tablet (150 mg dose) by mouth every 4 (four) weeks.    SABRA ibuprofen (ADVIL,MOTRIN) 800 mg tablet Take by mouth.    . insulin  lispro, 1 Unit Dial, (HUMALOG  KWIKPEN) 100 Units/mL SOPN Inject into the skin 3 (three) times a day. Insulin  pump    . irbesartan  (AVAPRO ) 75 MG tablet Take one tablet (75 mg dose) by mouth daily.    . irbesartan -hydrochlorothiazide  (AVALIDE) 300-12.5 MG per tablet Take one tablet by mouth daily.    . levothyroxine  sodium (SYNTHROID ,LEVOTHROID,LEVOXYL ) 150 mcg tablet Take one tablet (150 mcg dose) by mouth daily.    . LORAzepam  (ATIVAN ) 0.5 mg tablet Take by mouth.    . meclizine  HCl (ANTIVERT ) 12.5 mg tablet Take two tablets (25 mg dose) by mouth 3 (three) times a day.    . metFORMIN  ER (GLUCOPHAGE -XR) 750 MG 24 hr tablet SMARTSIG:1 Tablet(s) By Mouth Every Evening    . metoprolol  succinate (TOPROL -XL) 25 mg 24 hr tablet Take one tablet (25 mg dose) by mouth daily.    . Multiple Vitamins-Minerals (ZINC  PO)     . ondansetron  (ZOFRAN -ODT) 4 mg disintegrating tablet Take one tablet (4 mg dose) by mouth.    . pantoprazole  sodium (PROTONIX ) 40 mg tablet TAKE ONE TABLET BY MOUTH DAILY. 90 tablet 3  . pantoprazole  sodium (PROTONIX ) 40  mg tablet Take one tablet (40 mg dose) by mouth 2 (two) times daily. 60 tablet 5  . Pitavastatin Calcium  2 MG TABS Take one tablet by mouth daily.    . Probiotic Product (ALIGN PO) Take by mouth daily.    . promethazine  (PHENERGAN ) 25 MG tablet Take one tablet (  25 mg dose) by mouth as needed.    . rosuvastatin  calcium  (CRESTOR ) 5 mg tablet Take one tablet (5 mg dose) by mouth.    . temazepam  (RESTORIL ) 15 mg capsule Take one capsule (15 mg dose) by mouth at bedtime as needed for Sleep.    . tiZANidine  (ZANAFLEX ) 4 mg tablet TAKE 1 TABLET BY MOUTH EVERY 6 - 8 HOURS AS NEEDED NOT TO EXCEED 3 DOSES IN 24 HOURS    . valsartan  (DIOVAN ) 320 MG tablet Take by mouth.       Allergies  Allergen Reactions  . Atorvastatin Itching and Other    Muscle weakness, cramps and aches all over body Taking Crestor  with no issues 04/02/20  . Morphine  And Related Itching and Other    Flushing and feeling hot  . Ambien  Other    Eating at night  . Amitriptyline Other    Loopy feeling  . Cefuroxime Axetil Other    Stomach pain   . Codeine Nausea And Vomiting  . Docosahexaenoic Acid (Dha) Other    Stomach pain   Stomach pain   Stomach pain   Stomach pain   Stomach pain   Stomach pain   Stomach pain   Stomach pain   Stomach pain   Stomach pain  . Eszopiclone Other    Causes dizziness  . Heparin  Unknown    HIT ab positive  (SRA negative 11/25/17)   . Hydromorphone  Hcl Hallucinations    Hallucinations/ argumentative, goes beserk  . Omega-3-Acid  Ethyl Esters Other    Stomach pain   . Penicillin G Rash    headaches  . Rosuvastatin  Other    Muscle weakness, cramps and aching all over body  . Statins Other    Muscle weakness, cramps and aching all over body  . Statins-Hmg-Coa Reductase Inhibitors Other    Muscle weakness, cramps and aching all over body  . Suvorexant Other    ineffective  . Zolpidem  Other    Up sleep walking and eating  Up sleep walking and eating  Up sleep walking and eating   Up sleep walking and eating  Up sleep walking and eating  Up sleep walking and eating  Eating at night  Up sleep walking and eating  Up sleep walking and eating  . Zolpidem  Tartrate Other    Up sleep walking and eating  . Lunesta Nausea Only  . Lyrica Nausea Only  . Metformin  Hcl Nausea Only     Objective   BP 109/63 (BP Location: Left Upper Arm, Patient Position: Sitting)   Pulse 99   Temp 98 F (36.7 C) (Oral)   Ht 5' 2 (1.575 m)   Wt 207 lb (93.9 kg)   BMI 37.86 kg/m   General appearance: alert, appears stated age, and cooperative Head: Normocephalic, without obvious abnormality, atraumatic Lungs: clear to auscultation bilaterally Heart: regular rate and rhythm, S1, S2 normal, no murmur, click, rub or gallop Abdomen: soft, non-tender; bowel sounds normal; no masses,  no organomegaly Extremities: extremities normal, atraumatic, no cyanosis or edema Pulses: 2+ and symmetric Skin: Skin color, texture, turgor normal. No rashes or lesions    Assessment   1. Gastroesophageal reflux disease without esophagitis   2. History of colonic polyps   3. Dysphagia, unspecified type   4. History of small bowel obstruction   5. Epigastric pain     History of colon adenoma with high-grade dysplasia in the past.  Will discuss timing of colonoscopy with Dr. Lucio.  GERD/dysphagia-she did  not seem to respond to EGD with dilatation although no significant stricture noted.  We will plan a barium swallow with tablet to evaluate for a dysmotility issue.  I am also changing her PPI from pantoprazole  to omeprazole to see if she gets any better coverage. Plan   New Prescriptions   OMEPRAZOLE (PRILOSEC) 40 MG CAPSULE    Take one capsule (40 mg dose) by mouth daily.    Modified Medications   No medications on file    Discontinued Medications   No medications on file    Orders Placed This Encounter  Procedures  . FL Esophagram Double Contrast   Follow up in about 3 months (around  12/31/2023). Patient Instructions  Stop the pantoprazole   Start Omeprazole 40mg  daily in the am.   Recall:   Colorectal cancer screening: due for recall will discuss timing with Dr. Lucio. .  Discussion and Summary:    Electronically Signed: Helene DELENA [place], PA 10/03/2023 2:06 PM

## 2023-10-03 NOTE — Progress Notes (Signed)
 AVS printed

## 2023-10-23 ENCOUNTER — Other Ambulatory Visit: Payer: Self-pay

## 2023-10-23 MED ORDER — IMIPRAMINE HCL 50 MG PO TABS: 50.0000 mg | ORAL_TABLET | Freq: Every day | ORAL | 3 refills | Status: DC

## 2023-11-11 ENCOUNTER — Emergency Department (HOSPITAL_BASED_OUTPATIENT_CLINIC_OR_DEPARTMENT_OTHER)
Admission: EM | Admit: 2023-11-11 | Discharge: 2023-11-11 | Disposition: A | Discharge status: Home / Self Care | Attending: Emergency Medicine | Admitting: Emergency Medicine

## 2023-11-11 ENCOUNTER — Encounter (HOSPITAL_BASED_OUTPATIENT_CLINIC_OR_DEPARTMENT_OTHER): Payer: Self-pay | Admitting: Emergency Medicine

## 2023-11-11 ENCOUNTER — Emergency Department (HOSPITAL_BASED_OUTPATIENT_CLINIC_OR_DEPARTMENT_OTHER)

## 2023-11-11 ENCOUNTER — Other Ambulatory Visit: Payer: Self-pay

## 2023-11-11 DIAGNOSIS — E119 Type 2 diabetes mellitus without complications: Secondary | ICD-10-CM | POA: Diagnosis not present

## 2023-11-11 DIAGNOSIS — Z794 Long term (current) use of insulin: Secondary | ICD-10-CM | POA: Insufficient documentation

## 2023-11-11 DIAGNOSIS — E039 Hypothyroidism, unspecified: Secondary | ICD-10-CM | POA: Diagnosis not present

## 2023-11-11 DIAGNOSIS — I10 Essential (primary) hypertension: Secondary | ICD-10-CM | POA: Diagnosis not present

## 2023-11-11 DIAGNOSIS — Z79899 Other long term (current) drug therapy: Secondary | ICD-10-CM | POA: Insufficient documentation

## 2023-11-11 DIAGNOSIS — R55 Syncope and collapse: Secondary | ICD-10-CM | POA: Insufficient documentation

## 2023-11-11 DIAGNOSIS — Z7982 Long term (current) use of aspirin: Secondary | ICD-10-CM | POA: Insufficient documentation

## 2023-11-11 DIAGNOSIS — I2510 Atherosclerotic heart disease of native coronary artery without angina pectoris: Secondary | ICD-10-CM | POA: Insufficient documentation

## 2023-11-11 DIAGNOSIS — R42 Dizziness and giddiness: Secondary | ICD-10-CM | POA: Diagnosis present

## 2023-11-11 LAB — BASIC METABOLIC PANEL WITH GFR
Anion gap: 8 (ref 5–15)
BUN: 25 mg/dL — ABNORMAL HIGH (ref 8–23)
CO2: 28 mmol/L (ref 22–32)
Calcium: 9.9 mg/dL (ref 8.9–10.3)
Chloride: 102 mmol/L (ref 98–111)
Creatinine, Ser: 1.57 mg/dL — ABNORMAL HIGH (ref 0.44–1.00)
GFR, Estimated: 35 mL/min — ABNORMAL LOW (ref >60)
Glucose, Bld: 187 mg/dL — ABNORMAL HIGH (ref 70–99)
Potassium: 4.6 mmol/L (ref 3.5–5.1)
Sodium: 138 mmol/L (ref 135–145)

## 2023-11-11 LAB — CBC
HCT: 39 % (ref 36.0–46.0)
Hemoglobin: 12.9 g/dL (ref 12.0–15.0)
MCH: 27.7 pg (ref 26.0–34.0)
MCHC: 33.1 g/dL (ref 30.0–36.0)
MCV: 83.9 fL (ref 80.0–100.0)
Platelets: 201 10*3/uL (ref 150–400)
RBC: 4.65 MIL/uL (ref 3.87–5.11)
RDW: 13.5 % (ref 11.5–15.5)
WBC: 6.3 10*3/uL (ref 4.0–10.5)
nRBC: 0 % (ref 0.0–0.2)

## 2023-11-11 LAB — TROPONIN I (HIGH SENSITIVITY): Troponin I (High Sensitivity): 6 ng/L (ref <18)

## 2023-11-11 LAB — CBG MONITORING, ED: Glucose-Capillary: 248 mg/dL — ABNORMAL HIGH (ref 70–99)

## 2023-11-11 MED ORDER — OXYCODONE HCL 5 MG PO TABS
5.0000 mg | ORAL_TABLET | Freq: Once | ORAL | Status: AC
  Administered 2023-11-11: 5 mg via ORAL
  Filled 2023-11-11: qty 1

## 2023-11-11 MED ORDER — SODIUM CHLORIDE 0.9 % IV BOLUS
1000.0000 mL | Freq: Once | INTRAVENOUS | Status: AC
  Administered 2023-11-11: 1000 mL via INTRAVENOUS

## 2023-11-11 NOTE — Discharge Instructions (Addendum)
 You were evaluated the emergency room following a fall. Your lab work showed a mild acute kidney injury.  You were given fluids for this.  Otherwise there were no acute findings.  I suspect your syncopal episode was secondary to your muscle relaxer.  If this should recur the please return to the emergency room.

## 2023-11-11 NOTE — ED Triage Notes (Signed)
 Took muscle relaxer around 1600. Fell around 1730 and hit head with LOC. No thinners. Bruise to R side of forehead/ head. Dizzy, Diaphoretic. DM1, BG in 300s yesterday and today. BG in triage 248

## 2023-11-11 NOTE — ED Provider Notes (Signed)
 [place] [facility] AT [facility] Provider Note   CSN: [id number] Arrival date & time: 11/11/23  0102     History  No chief complaint on file.   [name] is a 73 y.o. female with past medical history as listed below presents following syncopal episode.  Patient states she took a tinazidine, shortly after she felt lightheaded and fell.  She got up with the help of her husband and started to walk and felt lightheaded again and fell again.  She states she hit her head.  Did not lose consciousness.  She does take aspirin regularly.  She states throughout the day she was feeling diaphoretic.  Without any chest pain or shortness of breath.  She has never had a seizure before.  HPI    Past Medical History:  Diagnosis Date   Anemia    iron deficiency   Blood transfusion    CAD (coronary artery disease), native coronary artery    coronary CTA showed a coronary Ca score of 506, mild CAD with 25-49% mRCA and moderate CAD with 50-69% sequential mRCA, 25-49% inferior sub branch of D1 and 25-49% in pLCx.     Cervical spondylosis with myelopathy and radiculopathy    CVA (cerebral vascular accident) (HCC)    11/2017 - no weakness or paralysis   Depression    Diabetes mellitus    Type 1   Diastolic dysfunction    Diverticulitis    DVT (deep venous thrombosis) (HCC)    times 2 lower leg   Endometriosis    Fibromyalgia    GERD (gastroesophageal reflux disease)    occ   Hiatal hernia    History of kidney stones    Hyperlipidemia    Hypertension    Hypothyroidism    Mild aortic insufficiency    by echo 04/2013   Osteoarthritis    back and knee   Ovarian cyst    RLS (restless legs syndrome)    Thoracic compression fracture (HCC)    Villous adenoma of right colon 04/08/2016   Vitamin D deficiency disease      Home Medications Prior to Admission medications   Medication Sig Start Date End Date Taking? Authorizing Provider  acetaminophen (TYLENOL) 500 MG  tablet Take 1,000 mg by mouth every 8 (eight) hours as needed for mild pain or headache.    [provider]  amLODipine (NORVASC) 2.5 MG tablet Take 2 tablets (5 mg total) by mouth daily. 05/25/21   Quintella Reichert, MD  aspirin EC 81 MG tablet Take 81 mg by mouth daily.    [provider]  Cholecalciferol (VITAMIN D3) 125 MCG (5000 UT) TABS Take 5,000 Units by mouth in the morning.    [provider]  Clobetasol Propionate (TEMOVATE) 0.05 % external spray Apply 1 spray topically at bedtime as needed (scalp eczema).    [provider]  escitalopram (LEXAPRO) 5 MG tablet Take 5 mg by mouth daily. 03/24/22   [provider]  gabapentin (NEURONTIN) 300 MG capsule Take 900 mg by mouth at bedtime.    [provider]  HUMALOG 100 UNIT/ML injection Inject 15 Units into the skin with breakfast, with lunch, and with evening meal. 02/03/22   [provider]  ibandronate (BONIVA) 150 MG tablet Take 150 mg by mouth every 30 (thirty) days. 03/06/19   [provider]  imipramine (TOFRANIL) 50 MG tablet Take 1 tablet (50 mg total) by mouth daily. 10/23/23   Shawnie Dapper, NP  insulin glargine (LANTUS) 100 UNIT/ML injection Inject 44 Units into the skin in the morning.    [provider]  irbesartan-hydrochlorothiazide (AVALIDE) 300-12.5 MG tablet Take 1 tablet by mouth in the morning.    [provider]  Levocetirizine Dihydrochloride (XYZAL PO) Take by mouth at bedtime.    [provider]  levothyroxine (SYNTHROID) 150 MCG tablet Take 150 mcg by mouth daily before breakfast.    [provider]  LORazepam (ATIVAN) 0.5 MG tablet Take 0.5-1 mg by mouth every 8 (eight) hours as needed for anxiety. 03/15/19   [provider]  Magnesium 250 MG TABS Take 250 mg by mouth in the morning.    [provider]  metoprolol succinate (TOPROL-XL) 25 MG 24 hr tablet TAKE 1 TABLET (25 MG TOTAL) BY MOUTH DAILY.  06/27/23   Quintella Reichert, MD  pantoprazole (PROTONIX) 40 MG tablet Take 40 mg by mouth daily before breakfast. 07/03/19   [provider]  Potassium 99 MG TABS Take 99 mg by mouth in the morning.    [provider]  Probiotic Product (PROBIOTIC PO) Take 1 capsule by mouth in the morning.    [provider]  promethazine (PHENERGAN) 25 MG tablet Take 25 mg by mouth every 8 (eight) hours as needed for nausea or vomiting.    [provider]  rosuvastatin (CRESTOR) 20 MG tablet Take 20 mg by mouth in the morning.    [provider]  temazepam (RESTORIL) 15 MG capsule Take 15 mg by mouth at bedtime.  10/10/17   [provider]  tiZANidine (ZANAFLEX) 4 MG tablet Take 4 mg by mouth every 6 (six) hours as needed for muscle spasms. 04/19/22   [provider]  vitamin B-12 (CYANOCOBALAMIN) 1000 MCG tablet Take 1,000 mcg by mouth in the morning.    [provider]  zinc sulfate 220 (50 Zn) MG capsule Take 220 mg by mouth in the morning.    [provider]      Allergies    Dilaudid [hydromorphone hcl], Heparin, Ambien [zolpidem tartrate], Belsomra [suvorexant], Codeine, Morphine and codeine, Penicillins, Statins, Ceftin [cefuroxime axetil], Elavil [amitriptyline], Lovaza [omega-3-acid ethyl esters (fish)], Lunesta [eszopiclone], Lyrica [pregabalin], and Metformin and related    Review of Systems   Review of Systems  Musculoskeletal:  Positive for myalgias.    Physical Exam Updated Vital Signs BP (!) 102/52 (BP Location: Left Arm)   Pulse 61   Temp 98.6 F (37 C)   Resp 18   SpO2 95%  Physical Exam Vitals and nursing note reviewed.  Constitutional:      General: She is not in acute distress.    Appearance: She is well-developed.  HENT:     Head: Normocephalic and atraumatic.  Eyes:     Conjunctiva/sclera: Conjunctivae normal.  Cardiovascular:     Rate and Rhythm: Normal rate and regular rhythm.     Heart  sounds: No murmur heard. Pulmonary:     Effort: Pulmonary effort is normal. No respiratory distress.     Breath sounds: Normal breath sounds.  Abdominal:     Palpations: Abdomen is soft.     Tenderness: There is no abdominal tenderness.  Musculoskeletal:     Cervical back: Neck supple.     Comments: Patient with mild swelling and tenderness to anterior ankle, no spinal midline tenderness, no gross deformities, tolerates full range of motion of all extremities  Skin:    General: Skin is warm and dry.  Capillary Refill: Capillary refill takes less than 2 seconds.  Neurological:     Mental Status: She is alert.     Comments: Patient is alert and oriented. There is no abnormal phonation. Symmetric smile without facial droop. Moves all extremities spontaneously. 5/5 strength in upper and lower extremities. . No sensation deficit. There is no nystagmus. EOMI, PERRL. Coordination intact with finger to nose and normal ambulation.    Psychiatric:        Mood and Affect: Mood normal.     ED Results / Procedures / Treatments   Labs (all labs ordered are listed, but only abnormal results are displayed) Labs Reviewed  BASIC METABOLIC PANEL WITH GFR - Abnormal; Notable for the following components:      Result Value   Glucose, Bld 187 (*)    BUN 25 (*)    Creatinine, Ser 1.57 (*)    GFR, Estimated 35 (*)    All other components within normal limits  CBG MONITORING, ED - Abnormal; Notable for the following components:   Glucose-Capillary 248 (*)    All other components within normal limits  CBC  URINALYSIS, ROUTINE W REFLEX MICROSCOPIC  TROPONIN I (HIGH SENSITIVITY)    EKG None  Radiology DG Ankle Complete Right Result Date: 11/11/2023 CLINICAL DATA:  Status post fall. EXAM: RIGHT ANKLE - COMPLETE 3+ VIEW COMPARISON:  None Available. FINDINGS: There is no evidence of fracture, dislocation, or joint effusion. There is no evidence of arthropathy or other focal bone abnormality. Soft  tissues are unremarkable. IMPRESSION: Negative. Electronically Signed   By: Aram Candela M.D.   On: 11/11/2023 20:46   CT Cervical Spine Wo Contrast Result Date: 11/11/2023 CLINICAL DATA:  Neck trauma, dangerous injury mechanism. Fall. Trauma to head. EXAM: CT CERVICAL SPINE WITHOUT CONTRAST TECHNIQUE: Multidetector CT imaging of the cervical spine was performed without intravenous contrast. Multiplanar CT image reconstructions were also generated. RADIATION DOSE REDUCTION: This exam was performed according to the departmental dose-optimization program which includes automated exposure control, adjustment of the mA and/or kV according to patient size and/or use of iterative reconstruction technique. COMPARISON:  CT of the cervical spine 09/01/2022 FINDINGS: Alignment: No significant listhesis is present. Solid fusion is present C2-C7. 3 mm anterolisthesis at C7-T1 is stable. Straightening of the normal cervical lordosis is present. Skull base and vertebrae: The craniocervical junction is normal. No acute fracture is present. Soft tissues and spinal canal: No prevertebral fluid or swelling. No visible canal hematoma. Disc levels: Solid fusion is present C2-C7. The central canal and foramina are decompressed at these levels. Moderate facet hypertrophy is present bilaterally at C7-T1. No definite stenosis is present. Upper chest: The lung apices are clear. The thoracic inlet is within normal limits. IMPRESSION: 1. No acute fracture or traumatic subluxation. 2. Solid fusion C2-C7. 3. 3 mm anterolisthesis at C7-T1 is stable. 4. Moderate facet hypertrophy bilaterally at C7-T1. Electronically Signed   By: Marin Roberts M.D.   On: 11/11/2023 18:55   CT Head Wo Contrast Result Date: 11/11/2023 CLINICAL DATA:  Fall with trauma to right side of head. Positive loss of consciousness. Hyperglycemia. EXAM: CT HEAD WITHOUT CONTRAST TECHNIQUE: Contiguous axial images were obtained from the base of the skull through  the vertex without intravenous contrast. RADIATION DOSE REDUCTION: This exam was performed according to the departmental dose-optimization program which includes automated exposure control, adjustment of the mA and/or kV according to patient size and/or use of iterative reconstruction technique. COMPARISON:  MR head without and with contrast  10/11/2022. CT head without contrast 09/01/2022. FINDINGS: Brain: Atrophy and white matter changes are mildly advanced for age, similar the prior exams. No acute infarct, hemorrhage, or mass lesion is present. The ventricles are of normal size. No significant extraaxial fluid collection is present. The brainstem and cerebellum are within normal limits. Midline structures are within normal limits. Vascular: Atherosclerotic calcifications are present within the cavernous internal carotid arteries bilaterally. No hyperdense vessel is present. Skull: Right supraorbital scalp soft tissue swelling and hematoma is present without underlying fracture. No foreign body is present. Calvarium is intact. No focal lytic or blastic lesions are present. Sinuses/Orbits: Chronic right maxillary sinus disease is present. Circumferential mucosal thickening wall thickening present. The paranasal sinuses and mastoid air cells are otherwise clear. Bilateral lens replacements are noted. Globes and orbits are otherwise unremarkable. IMPRESSION: 1. Right supraorbital scalp soft tissue swelling and hematoma without underlying fracture. 2. No acute intracranial abnormality. 3. Atrophy and white matter disease is mildly advanced for age, similar the prior exams. This likely reflects the sequela of chronic microvascular ischemia. 4. Chronic right maxillary sinus disease. Electronically Signed   By: Marin Roberts M.D.   On: 11/11/2023 18:51    Procedures Procedures    Medications Ordered in ED Medications  sodium chloride 0.9 % bolus 1,000 mL (1,000 mLs Intravenous New Bag/Given 11/11/23 2058)     ED Course/ Medical Decision Making/ A&P                                 Medical Decision Making Amount and/or Complexity of Data Reviewed Labs: ordered. Radiology: ordered.   This patient presents to the ED with chief complaint(s) of fall .  The complaint involves an extensive differential diagnosis and also carries with it a high risk of complications and morbidity.   pertinent past medical history as listed in HPI  The differential diagnosis includes  CVA, TIA, intracranial hemorrhage, fracture, dislocation, seizure, electrolyte abnormality, cardiac etiology  Additional history obtained: Additional history obtained from family Records reviewed Care Everywhere/External Records  Initial Assessment:   Hemodynamically stable, nontoxic-appearing patient presents following a fall today after she took a muscle relaxer.  She typically tolerates these medications.  She describes feeling dizzy prior to the fall without any chest pain or shortness of breath.  She does have a significant history of CVA and CAD.  No neurodeficits on exam.  She does have some mild anteriolateral ankle tenderness with some mild swelling.  Otherwise no other significant concern for orthopedic injuries.  Lung sounds are clear.  Abdomen is nontender.  Will obtain CT imaging of head and neck and obtain labs and EKG.  Independent ECG interpretation:  sinus rhythm with first-degree block, PAC and RBBB  Independent labs interpretation:  The following labs were independently interpreted:  CBC unremarkable,  Independent visualization and interpretation of imaging: I independently visualized the following imaging with scope of interpretation limited to determining acute life threatening conditions related to emergency care: CT head and neck, which revealed no acute abnormality  Treatment and Reassessment: Patient appears to have a mild AKI will give fluid bolus.    Consultations obtained:   none  Disposition:    Patient discharged home. The patient has been appropriately medically screened and/or stabilized in the ED. I have low suspicion for any other emergent medical condition which would require further screening, evaluation or treatment in the ED or require inpatient management. At time of discharge  the patient is hemodynamically stable and in no acute distress. I have discussed work-up results and diagnosis with patient and answered all questions. Patient is agreeable with discharge plan. We discussed strict return precautions for returning to the emergency department and they verbalized understanding.     Social Determinants of Health:   None  This note was dictated with voice recognition software.  Despite best efforts at proofreading, errors may have occurred which can change the documentation meaning.          Final Clinical Impression(s) / ED Diagnoses Final diagnoses:  Syncope, unspecified syncope type    Rx / DC Orders ED Discharge Orders     None         Fabienne Bruns 11/11/23 2147    Vanetta Mulders, MD 11/12/23 1102

## 2024-01-16 ENCOUNTER — Encounter: Payer: Self-pay | Admitting: Family Medicine

## 2024-01-16 ENCOUNTER — Ambulatory Visit: Payer: Medicare Other | Admitting: Family Medicine

## 2024-01-16 VITALS — BP 153/83 | HR 82 | Ht 63.0 in | Wt 206.0 lb

## 2024-01-16 DIAGNOSIS — G43709 Chronic migraine without aura, not intractable, without status migrainosus: Secondary | ICD-10-CM

## 2024-01-16 MED ORDER — IMIPRAMINE HCL 50 MG PO TABS: 50.0000 mg | ORAL_TABLET | Freq: Every day | ORAL | 3 refills | Status: AC

## 2024-01-16 NOTE — Addendum Note (Signed)
 Addended by: Terrilyn Fick L on: 01/16/2024 02:36 PM   Modules accepted: Level of Service

## 2024-01-16 NOTE — Progress Notes (Signed)
 PATIENT: [name] DOB: 03-19-1951  REASON FOR VISIT: follow up HISTORY FROM: patient  Chief Complaint  Patient presents with   Follow-up    Pt in room 2. Alone. Here for migraine follow up. Pt reports migraines has been okay.     HISTORY OF PRESENT ILLNESS:  01/16/2024 ALL:  [name] returns for follow up for migraines. She reports headaches are well managed. She may have a few milder tension/sinus headaches per month. She contributes these to allergies. She has not had a migraine in months. She will take Tylenol  for abortive therapy that works well for her. No aura.   She doesn't sleep well. She has trouble getting to and staying asleep. She takes temazepam  but doesn't feel it is very effective. She tries to keep a regular sleep routine. She denies morning headaches. No dry mouth or nocturia. No longer having RLS symptoms on gabapentin . She does report that her husband tells her she snores when on her back. They do not sleep in the same room. She has a brother with sleep apnea. She does not feel she can use CPAP therapy d/t claustrophobia. BP is usually well managed. She feels today's reading is elevated d/t white coat syndrome. She has appt with Dr Camilo Cella, PCP, today.   01/16/2023 ALL: [name] returns for follow up for migraines. She continues imipramine  50mg  at bedtime. She reports migraines remain well managed. She can't remember the last migraine she had. She does report occasional sinus headaches and occasional tringe if she is late taking imipramine . She seems to tolerate it well. She continues to follow with Dr Larrie Po. Injections with [facility] were not effective. She is considering nerve stimulator. She was diagnosed with sever CTS of left hand. She has an appt with Dr Larrie Po tomorrow.   04/04/2022 ALL: [name] returns for acute visit for an intractable migraine 8/16. She was seen by ER for nausea, headache and neck pain. Migraine cocktail given with little benefit. Low dose Toradol   given d/t AKI. Creatine 1.64. She reported hernia repair was cancelled due to elevated A1C of 9.8.   She was last seen 01/2022 and reported migraines were well managed on imipramine  50mg  QHS and Tylenol  PRN. She reports that headaches are usually well managed. She has had more, recently, but contributes this to back pain.   She is having significant back and neck pain. She is s/p anterior cervical and lumbar fusion with Dr Larrie Po. She has also been seeing Dr Crecencio Dodge for pain management. She reports pain starts in the upper back, radiates to the neck and top of her head. She is taking gabapentin  900mg  at bedtime. Also on duloxetine  30mg  daily. She has been taking cyclobenzaprine  10mg  daily but is not sure it is helping. She has pain with flexion and extension of her neck and back/neck is tender to touch. No obvious triggers or injuries to cause pain.   01/11/2022 ALL: [name] returns for follow up for migraines. She continues imipramine  50mg  QHS. She continues to feel migraines are well managed. She rarely has a headache. Tylenol  works well for abortive therapy. She continues to work with care team for chronic comorbidities.   01/14/2021 ALL: [name] returns for follow up for migraines. She continues imipramine  50mg  at bedtime. She reports headaches are significantly improved. She may take Tylenol  a couple times a month and usually aborts headache. She had emergency surgery in 09/2019 for perforated bowel with sepsis. She is recovering well. She is followed closely by GI and PCP. She  had the flu last week.   07/16/2020 MM: [name] is a 73 year old female with a history of migraine headaches.  She returns today for follow-up.  She is currently on imipramine  50 mg at bedtime.  She states that since she started this medication she has not had any headaches.  She states that has been working  well for her.  She denies any significant side effects.  She returns today for an evaluation.  04/14/2020 ALL: [name]  is a 73 y.o. female here today for follow up for migraines. She was unable to afford Ajovy  or Amovig. We have not tried Emgality . She continues to have daily headaches. She feels a lot are triggered by chronic sinus infections. She admits that blood sugars are up and down. Stress definitely contributes. She has chronic pain. She is no longer on opioid therapy. She continues gabapentin  and duloxetine .   She does snore. She feels that this is related to her having chronic sinus issues. She occasionally wakes with headache but feels headaches more frequently get worse throughout the day. She denies frequent waking. She denies excessive daytime sleepiness. She denies waking with dry mouth.   She continues regular follow up with PCP, pain management and endocrinology. She is on daily meal replacement insulin  as well as daily basil insulin . CBGs "are up and down". Fasting reading this morning was 200. Last A1C 10. BP usually well managed. She continues metoprolol .    09/10/2019 ALL: [name] is a 73 y.o. female here today for follow up for new onset headaches. She was started on Ajovy  for concerns of daily headaches with migrainous features. MRI normal. She reports that headaches improved in December. She repeated injection in January and does not feel that it was as effective. She has had trouble with sinus infection, sore throat, allergy symptoms and feels that has contributed as well. She no longer has daily headaches. She feels that she had a headache at least 10-15 days last month. Still has a lot of stress. CBG's shot up around 400 after taking doxycycline and prednisone, course completed last week. BP is up and down. She has not taken her BP meds today because she has not felt well. She is having neck pain, s/p ACDF in 04/2019. She continues to follow with Dr Victory Gravel with [facility]. She has had to take oxycodone  more recently due to neck pain.     HISTORY: (copied from Dr Harding Li note on 07/10/2019)    HPI:  [name] is a 74 y.o. female here as requested by Victorio Grave, MD for headache.  Past medical history hypertension, DVT on Xarelto , ACDF cervical, diabetes, fibromyalgia, aortic insufficiency.  Being sent for headache.  Patient was seen in the emergency room in July of this year with elevated high blood pressure complaining of headache on the top of the head in the setting of blood pressure 180s over 90s throughout the day, it was 198/100 before she took her blood pressure medications but never significantly decreased, no other focal neurologic symptoms, chronic pain in her neck, she was taking Mucinex which may have been contributing to her hypertension.  CT of the head was negative.  Hydralazine  was given, suspected headache due to blood pressure elevation, patient was feeling better when blood pressure improved to the 150s to the 160s.   Mother and grandmother had headaches. Headaches recently started 7-8 months ago. No inciting events, no new medications or head trauma or anything she can tie it  back to. Gradually started. [place] trigers. She has pain in the face behind the eyes and in the sinuses, she was told she had sinusitis but Dr. Tellis Feathers ENT said she didn't. The pain comes behind her head, up the neck, and radiates to the temples, feels liks her head is bouncing, pounding and pulsating and throbbing, dull ache, she recently had an ACDF and no changes to the headache since then, not better or worse, she wakes up with the headaches every day, not overly tired during the day, she has headaches every day, she has tylenol , advil, oxycodone , flexeril , lorazepam , heat, ice, no difference, she reports no significant vision changes, the headache is positional and exertional and bending over for example made her hurt and get dizzy.    Reviewed notes, labs and imaging from outside physicians, which showed:   Personally reviewed CT of the head report February 26, 2019 which showed unremarkable  brain.   REVIEW OF SYSTEMS: Out of a complete 14 system review of symptoms, the patient complains only of the following symptoms, chronic headaches, chronic sinusitis, chronic pain, fatigue, anxiety, and all other reviewed systems are negative.   ALLERGIES: Allergies  Allergen Reactions   Dilaudid  [Hydromorphone  Hcl] Other (See Comments)    Hallucinations/ argumentative, goes beserk   Heparin  Other (See Comments)    HIT ab positive  (SRA negative 11/25/17)    Ambien  [Zolpidem  Tartrate] Other (See Comments)    Up sleep walking and eating   Belsomra [Suvorexant]     ineffective Other reaction(s): Other ineffective   Codeine Nausea And Vomiting   Morphine  And Codeine Other (See Comments)    Flushing and feeling hot Tolerates with Diphenhydramine    Penicillins Rash and Other (See Comments)    Caused Headaches also Has patient had a PCN reaction causing immediate rash, facial/tongue/throat swelling, SOB or lightheadedness with hypotension: No Has patient had a PCN reaction causing severe rash involving mucus membranes or skin necrosis: No Has patient had a PCN reaction that required hospitalization No Has patient had a PCN reaction occurring within the last 10 years: No If all of the above answers are "NO", then may proceed with Cephalosporin use.     Statins Other (See Comments)    Muscle weakness, cramps and aching all over body   Ceftin [Cefuroxime Axetil] Other (See Comments)    Stomach pain    Elavil [Amitriptyline] Other (See Comments)    Loopy feeling   Lovaza  [Omega-3-Acid  Ethyl Esters (Fish)] Other (See Comments)    Stomach pain    Lunesta [Eszopiclone] Other (See Comments)    Causes dizziness   Lyrica [Pregabalin] Nausea Only   Metformin  And Related Nausea Only    HOME MEDICATIONS: Outpatient Medications Prior to Visit  Medication Sig Dispense Refill   acetaminophen  (TYLENOL ) 500 MG tablet Take 1,000 mg by mouth every 8 (eight) hours as needed for mild pain or  headache.     amLODipine  (NORVASC ) 2.5 MG tablet Take 2 tablets (5 mg total) by mouth daily. 90 tablet 3   aspirin  EC 81 MG tablet Take 81 mg by mouth daily.     Cholecalciferol (VITAMIN D3) 125 MCG (5000 UT) TABS Take 5,000 Units by mouth in the morning.     Clobetasol  Propionate (TEMOVATE ) 0.05 % external spray Apply 1 spray topically at bedtime as needed (scalp eczema).     escitalopram  (LEXAPRO ) 5 MG tablet Take 5 mg by mouth daily.     gabapentin  (NEURONTIN ) 300 MG capsule Take 900 mg by  mouth at bedtime.     HUMALOG  100 UNIT/ML injection Inject 15 Units into the skin with breakfast, with lunch, and with evening meal.     ibandronate (BONIVA) 150 MG tablet Take 150 mg by mouth every 30 (thirty) days.     insulin  glargine (LANTUS ) 100 UNIT/ML injection Inject 44 Units into the skin in the morning.     irbesartan -hydrochlorothiazide  (AVALIDE) 300-12.5 MG tablet Take 1 tablet by mouth in the morning.     Levocetirizine Dihydrochloride (XYZAL PO) Take by mouth at bedtime.     levothyroxine  (SYNTHROID ) 150 MCG tablet Take 150 mcg by mouth daily before breakfast.     LORazepam  (ATIVAN ) 0.5 MG tablet Take 0.5-1 mg by mouth every 8 (eight) hours as needed for anxiety.     Magnesium  250 MG TABS Take 250 mg by mouth in the morning.     metoprolol  succinate (TOPROL -XL) 25 MG 24 hr tablet TAKE 1 TABLET (25 MG TOTAL) BY MOUTH DAILY. 90 tablet 3   pantoprazole  (PROTONIX ) 40 MG tablet Take 40 mg by mouth daily before breakfast.     Potassium 99 MG TABS Take 99 mg by mouth in the morning.     Probiotic Product (PROBIOTIC PO) Take 1 capsule by mouth in the morning.     promethazine  (PHENERGAN ) 25 MG tablet Take 25 mg by mouth every 8 (eight) hours as needed for nausea or vomiting.     rosuvastatin  (CRESTOR ) 20 MG tablet Take 20 mg by mouth in the morning.     temazepam  (RESTORIL ) 15 MG capsule Take 15 mg by mouth at bedtime.   4   tiZANidine  (ZANAFLEX ) 4 MG tablet Take 4 mg by mouth every 6 (six) hours as  needed for muscle spasms.     vitamin B-12 (CYANOCOBALAMIN ) 1000 MCG tablet Take 1,000 mcg by mouth in the morning.     zinc  sulfate 220 (50 Zn) MG capsule Take 220 mg by mouth in the morning.     imipramine  (TOFRANIL ) 50 MG tablet Take 1 tablet (50 mg total) by mouth daily. 90 tablet 3   No facility-administered medications prior to visit.    PAST MEDICAL HISTORY: Past Medical History:  Diagnosis Date   Anemia    iron deficiency   Blood transfusion    CAD (coronary artery disease), native coronary artery    coronary CTA showed a coronary Ca score of 506, mild CAD with 25-49% mRCA and moderate CAD with 50-69% sequential mRCA, 25-49% inferior sub branch of D1 and 25-49% in pLCx.     Cervical spondylosis with myelopathy and radiculopathy    CVA (cerebral vascular accident) (HCC)    11/2017 - no weakness or paralysis   Depression    Diabetes mellitus    Type 1   Diastolic dysfunction    Diverticulitis    DVT (deep venous thrombosis) (HCC)    times 2 lower leg   Endometriosis    Fibromyalgia    GERD (gastroesophageal reflux disease)    occ   Hiatal hernia    History of kidney stones    Hyperlipidemia    Hypertension    Hypothyroidism    Mild aortic insufficiency    by echo 04/2013   Osteoarthritis    back and knee   Ovarian cyst    RLS (restless legs syndrome)    Thoracic compression fracture (HCC)    Villous adenoma of right colon 04/08/2016   Vitamin D  deficiency disease     PAST SURGICAL HISTORY: Past  Surgical History:  Procedure Laterality Date   ANTERIOR CERVICAL DECOMP/DISCECTOMY FUSION N/A 03/23/2017   Procedure: ANTERIOR CERVICAL DECOMPRESSION/DISCECTOMY FUSION, INTERBODY PROSTHESIS,PLATE CERVICAL THREE- CERVICAL FOUR, CERVICAL FOUR- CERVICAL FIVE, CERVICAL FIVE- CERVICAL SIX;  Surgeon: Garry Kansas, MD;  Location: [facility] OR;  Service: Neurosurgery;  Laterality: N/A;   ANTERIOR CERVICAL DECOMP/DISCECTOMY FUSION N/A 04/18/2019   Procedure: ANTERIOR CERVICAL  DECOMPRESSION/DISCECTOMY  CERVICAL SIX- CERVICAL SEVEN;  Surgeon: Garry Kansas, MD;  Location: [facility] OR;  Service: Neurosurgery;  Laterality: N/A;  anterior   APPENDECTOMY     BACK SURGERY  2005   April  2013 - spinal fusion@ [facility]   bowel blockage surgery     BREAST SURGERY     breast reduction   CARDIAC CATHETERIZATION  04/2013   normal coronary arteries and normal LVF   CARPAL TUNNEL RELEASE  06/05/2012   Procedure: CARPAL TUNNEL RELEASE;  Surgeon: Amelie Baize., MD;  Location: [facility];  Service: Orthopedics;  Laterality: Left;   CESAREAN SECTION     x 2   CHOLECYSTECTOMY     COLECTOMY  04/08/2016   DILATION AND CURETTAGE OF UTERUS     DORSAL COMPARTMENT RELEASE  06/05/2012   Procedure: RELEASE DORSAL COMPARTMENT (DEQUERVAIN);  Surgeon: Amelie Baize., MD;  Location: [facility];  Service: Orthopedics;  Laterality: Left;  Excision of mixoid cyst also   EYE SURGERY     cataracts bilateral   INCISIONAL HERNIA REPAIR N/A 05/18/2022   Procedure: REPAIR INCISIONAL HERNIA WITH MESH;  Surgeon: Sim Dryer, MD;  Location: [facility] OR;  Service: General;  Laterality: N/A;   JOINT REPLACEMENT     Bilateral knee   KNEE ARTHROPLASTY  2009   lft partial   KNEE ARTHROPLASTY     rt   LAPAROSCOPIC PARTIAL COLECTOMY N/A 04/08/2016   Procedure: LAPAROSCOPIC ASSISTED ASCENDING COLECTOMY POSSIBLE OPEN COLECTOMY;  Surgeon: Boyce Byes, MD;  Location: [facility] OR;  Service: General;  Laterality: N/A;   orthopedic surgeries     multiple   perforated bowl     Sep 09 2020   REDUCTION MAMMAPLASTY Bilateral    SHOULDER OPEN ROTATOR CUFF REPAIR     rt and lft   TEE WITHOUT CARDIOVERSION N/A 11/23/2017   Procedure: TRANSESOPHAGEAL ECHOCARDIOGRAM (TEE);  Surgeon: Hugh Madura, MD;  Location: [facility] ENDOSCOPY;  Service: Cardiovascular;  Laterality: N/A;   TUBAL LIGATION     btsp    FAMILY HISTORY: Family History  Problem Relation Age of Onset   Heart attack Father         60s   Hypothyroidism Father    Diabetes type II Father    Kidney failure Father    Diabetes type II Mother    Hypertension Mother    Breast cancer Mother 84   Dementia Mother    Hypothyroidism Brother    Diabetes Brother    Breast cancer Maternal Aunt    Breast cancer Cousin    Breast cancer Maternal Aunt    Breast cancer Cousin    High blood pressure Other        "all"   Heart attack Brother    Lung cancer Brother     SOCIAL HISTORY: Social History   Socioeconomic History   Marital status: Married    Spouse name: Georgette Kins / Willey Harrier   Number of children: 2   Years of education: trade school   Highest education level: Not on file  Occupational History   Occupation: retired  Tobacco Use   Smoking status: Never   Smokeless tobacco: Never  Vaping Use   Vaping status: Never Used  Substance and Sexual Activity   Alcohol use: Never   Drug use: Never   Sexual activity: Not Currently    Birth control/protection: Post-menopausal, Surgical  Other Topics Concern   Not on file  Social History Narrative   Lives at home with her husband   Right handed   Caffeine: 2-3 cups of coffee/day   Social Drivers of Health   Financial Resource Strain: Low Risk  (09/24/2020)   Received from [facility], [facility]   Overall Financial Resource Strain (CARDIA)    Difficulty of Paying Living Expenses: Not hard at all  Food Insecurity: No Food Insecurity (06/02/2022)   Hunger Vital Sign    Worried About Running Out of Food in the Last Year: Never true    Ran Out of Food in the Last Year: Never true  Transportation Needs: No Transportation Needs (06/02/2022)   PRAPARE - Administrator, Civil Service (Medical): No    Lack of Transportation (Non-Medical): No  Physical Activity: Not on file  Stress: No Stress Concern Present (09/24/2020)   Received from [facility], [facility] - Occupational Stress Questionnaire     Feeling of Stress : Not at all  Social Connections: Unknown (12/20/2021)   Received from [facility], [facility]   Social Network    Social Network: Not on file  Intimate Partner Violence: Not At Risk (06/02/2022)   Humiliation, Afraid, Rape, and Kick questionnaire    Fear of Current or Ex-Partner: No    Emotionally Abused: No    Physically Abused: No    Sexually Abused: No      PHYSICAL EXAM  Vitals:   01/16/24 1033  BP: (!) 153/83  Pulse: 82  Weight: 206 lb (93.4 kg)  Height: 5\' 3"  (1.6 m)     Mallampati IV   Body mass index is 36.49 kg/m.  Generalized: Well developed, in no acute distress  Cardiology: normal rate and rhythm, no murmur noted Respiratory: clear to auscultation bilaterally  Neurological examination  Mentation: Alert oriented to time, place, history taking. Follows all commands speech and language fluent Cranial nerve II-XII: Pupils were equal round reactive to light. Extraocular movements were full, visual field were full on confrontational test. Facial sensation and strength were normal. Uvula tongue midline. Head turning and shoulder shrug  were normal and symmetric. Motor: The motor testing reveals 5 over 5 strength of all 4 extremities. Good symmetric motor tone is noted throughout.  Gait and station: Gait is normal.    DIAGNOSTIC DATA (LABS, IMAGING, TESTING) - I reviewed patient records, labs, notes, testing and imaging myself where available.      No data to display           Lab Results  Component Value Date   WBC 6.3 11/11/2023   HGB 12.9 11/11/2023   HCT 39.0 11/11/2023   MCV 83.9 11/11/2023   PLT 201 11/11/2023      Component Value Date/Time   NA 138 11/11/2023 2001   NA 133 (L) 02/18/2020 1119   K 4.6 11/11/2023 2001   CL 102 11/11/2023 2001   CO2 28 11/11/2023 2001   GLUCOSE 187 (H) 11/11/2023 2001   BUN 25 (H) 11/11/2023 2001   BUN 14 02/18/2020 1119   CREATININE 1.57 (H) 11/11/2023 2001   CALCIUM  9.9 11/11/2023  2001  PROT 6.6 06/02/2022 1011   PROT 6.5 07/10/2019 1442   ALBUMIN  3.2 (L) 06/02/2022 1011   ALBUMIN  4.1 07/10/2019 1442   AST 11 (L) 06/02/2022 1011   ALT 11 06/02/2022 1011   ALKPHOS 76 06/02/2022 1011   BILITOT 1.3 (H) 06/02/2022 1011   BILITOT 0.4 07/10/2019 1442   GFRNONAA 35 (L) 11/11/2023 2001   GFRAA 73 02/18/2020 1119   Lab Results  Component Value Date   CHOL 162 03/25/2020   HDL 48 03/25/2020   LDLCALC 94 03/25/2020   TRIG 109 03/25/2020   CHOLHDL 3.4 03/25/2020   Lab Results  Component Value Date   HGBA1C 9.8 (H) 03/07/2022   No results found for: "VITAMINB12" Lab Results  Component Value Date   TSH 1.310 07/10/2019       ASSESSMENT AND PLAN 73 y.o. year old female  has a past medical history of Anemia, Blood transfusion, CAD (coronary artery disease), native coronary artery, Cervical spondylosis with myelopathy and radiculopathy, CVA (cerebral vascular accident) (HCC), Depression, Diabetes mellitus, Diastolic dysfunction, Diverticulitis, DVT (deep venous thrombosis) (HCC), Endometriosis, Fibromyalgia, GERD (gastroesophageal reflux disease), Hiatal hernia, History of kidney stones, Hyperlipidemia, Hypertension, Hypothyroidism, Mild aortic insufficiency, Osteoarthritis, Ovarian cyst, RLS (restless legs syndrome), Thoracic compression fracture (HCC), Villous adenoma of right colon (04/08/2016), and Vitamin D  deficiency disease. here with     ICD-10-CM   1. Chronic migraine without aura without status migrainosus, not intractable  G43.709      Rashana reports headaches are well managed. Rare migraines. She is tolerating imipramine  with no obvious adverse effects. She will continue 50mg  at bedtime. We have discussed concerns of insomnia. Discussed possible concerns of sleep apnea based on family history and anatomy. Consider sleep study to rule out sleep apnea. Risks of untreated sleep apnea reviewed. Healthy lifestyle habits advised.  I have encouraged her to  continue close follow up with PCP, GI, NS, pain management and endocrinology. Healthy lifestyle habits discussed. She will follow up with me in 1 year, sooner if needed.    No orders of the defined types were placed in this encounter.   Meds ordered this encounter  Medications   imipramine  (TOFRANIL ) 50 MG tablet    Sig: Take 1 tablet (50 mg total) by mouth daily.    Dispense:  90 tablet    Refill:  3    Supervising Provider:   AHERN, ANTONIA B [id number]     I personally spent a total of 30 minutes in the care of the patient today including preparing to see the patient, getting/reviewing separately obtained history, performing a medically appropriate exam/evaluation, counseling and educating, placing orders, and documenting clinical information in the EHR.   Terrilyn Fick, FNP-C 01/16/2024, 11:03 AM [facility] [address], [address] [place], [place] [address] [phone]

## 2024-01-16 NOTE — Patient Instructions (Signed)
 Below is our plan:  We will continue imipramine  50mg  at bedtime. Continue Tylenol  for abortive therapy. Consider a sleep evaluation. Read about sleep apnea and let me know if you want to test for it.   Please make sure you are staying well hydrated. I recommend 50-60 ounces daily. Well balanced diet and regular exercise encouraged. Consistent sleep schedule with 6-8 hours recommended.   Please continue follow up with care team as directed.   Follow up with me in 1 year   You may receive a survey regarding today's visit. I encourage you to leave honest feed back as I do use this information to improve patient care. Thank you for seeing me today!   GENERAL HEADACHE INFORMATION:   Natural supplements: Magnesium  Oxide or Magnesium  Glycinate 500 mg at bed (up to 800 mg daily) Coenzyme Q10 300 mg in AM Vitamin B2- 200 mg twice a day   Add 1 supplement at a time since even natural supplements can have undesirable side effects. You can sometimes buy supplements cheaper (especially Coenzyme Q10) at www.WebmailGuide.co.za or at [facility].  Migraine with aura: There is increased risk for stroke in women with migraine with aura and a contraindication for the combined contraceptive pill for use by women who have migraine with aura. The risk for women with migraine without aura is lower. However other risk factors like smoking are far more likely to increase stroke risk than migraine. There is a recommendation for no smoking and for the use of OCPs without estrogen such as progestogen only pills particularly for women with migraine with aura.Aaron Aas People who have migraine headaches with auras may be 3 times more likely to have a stroke caused by a blood clot, compared to migraine patients who don't see auras. Women who take hormone-replacement therapy may be 30 percent more likely to suffer a clot-based stroke than women not taking medication containing estrogen. Other risk factors like smoking and high blood pressure may  be  much more important.    Vitamins and herbs that show potential:   Magnesium : Magnesium  (250 mg twice a day or 500 mg at bed) has a relaxant effect on smooth muscles such as blood vessels. Individuals suffering from frequent or daily headache usually have low magnesium  levels which can be increase with daily supplementation of 400-750 mg. Three trials found 40-90% average headache reduction  when used as a preventative. Magnesium  may help with headaches are aura, the best evidence for magnesium  is for migraine with aura is its thought to stop the cortical spreading depression we believe is the pathophysiology of migraine aura.Magnesium  also demonstrated the benefit in menstrually related migraine.  Magnesium  is part of the messenger system in the serotonin cascade and it is a good muscle relaxant.  It is also useful for constipation which can be a side effect of other medications used to treat migraine. Good sources include nuts, whole grains, and tomatoes. Side Effects: loose stool/diarrhea  Riboflavin (vitamin B 2) 200 mg twice a day. This vitamin assists nerve cells in the production of ATP a principal energy storing molecule.  It is necessary for many chemical reactions in the body.  There have been at least 3 clinical trials of riboflavin using 400 mg per day all of which suggested that migraine frequency can be decreased.  All 3 trials showed significant improvement in over half of migraine sufferers.  The supplement is found in bread, cereal, milk, meat, and poultry.  Most Americans get more riboflavin than the recommended daily  allowance, however riboflavin deficiency is not necessary for the supplements to help prevent headache. Side effects: energizing, green urine   Coenzyme Q10: This is present in almost all cells in the body and is critical component for the conversion of energy.  Recent studies have shown that a nutritional supplement of CoQ10 can reduce the frequency of migraine attacks by  improving the energy production of cells as with riboflavin.  Doses of 150 mg twice a day have been shown to be effective.   Melatonin: Increasing evidence shows correlation between melatonin secretion and headache conditions.  Melatonin supplementation has decreased headache intensity and duration.  It is widely used as a sleep aid.  Sleep is natures way of dealing with migraine.  A dose of 3 mg is recommended to start for headaches including cluster headache. Higher doses up to 15 mg has been reviewed for use in Cluster headache and have been used. The rationale behind using melatonin for cluster is that many theories regarding the cause of Cluster headache center around the disruption of the normal circadian rhythm in the brain.  This helps restore the normal circadian rhythm.   HEADACHE DIET: Foods and beverages which may trigger migraine Note that only 20% of headache patients are food sensitive. You will know if you are food sensitive if you get a headache consistently 20 minutes to 2 hours after eating a certain food. Only cut out a food if it causes headaches, otherwise you might remove foods you enjoy! What matters most for diet is to eat a well balanced healthy diet full of vegetables and low fat protein, and to not miss meals.   Chocolate, other sweets ALL cheeses except cottage and cream cheese Dairy products, yogurt, sour cream, ice cream Liver Meat extracts (Bovril, Marmite, meat tenderizers) Meats or fish which have undergone aging, fermenting, pickling or smoking. These include: Hotdogs,salami,Lox,sausage, mortadellas,smoked salmon, pepperoni, Pickled herring Pods of broad bean (English beans, Chinese pea pods, [place] (fava) beans, lima and navy beans Ripe avocado, ripe banana Yeast extracts or active yeast preparations such as Brewer's or Fleishman's (commercial bakes goods are permitted) Tomato based foods, pizza (lasagna, etc.)   MSG (monosodium glutamate) is disguised as many  things; look for these common aliases: Monopotassium glutamate Autolysed yeast Hydrolysed protein Sodium caseinate "flavorings" "all natural preservatives" Nutrasweet   Avoid all other foods that convincingly provoke headaches.   Resources: The Dizzy Althia Jetty Your Headache Diet, migrainestrong.com  https://zamora-andrews.com/   Caffeine and Migraine For patients that have migraine, caffeine intake more than 3 days per week can lead to dependency and increased migraine frequency. I would recommend cutting back on your caffeine intake as best you can. The recommended amount of caffeine is 200-300 mg daily, although migraine patients may experience dependency at even lower doses. While you may notice an increase in headache temporarily, cutting back will be helpful for headaches in the long run. For more information on caffeine and migraine, visit: https://americanmigrainefoundation.org/resource-library/caffeine-and-migraine/   Headache Prevention Strategies:   1. Maintain a headache diary; learn to identify and avoid triggers.  - This can be a simple note where you log when you had a headache, associated symptoms, and medications used - There are several smartphone apps developed to help track migraines: Migraine Buddy, Migraine Monitor, Curelator N1-Headache App   Common triggers include: Emotional triggers: Emotional/Upset family or friends Emotional/Upset occupation Business reversal/success Anticipation anxiety Crisis-serious Post-crisis periodNew job/position   Physical triggers: Vacation Day Weekend Strenuous Exercise High Altitude Location New Move Menstrual  Day Physical Illness Oversleep/Not enough sleep Weather changes Light: Photophobia or light sesnitivity treatment involves a balance between desensitization and reduction in overly strong input. Use dark polarized glasses outside, but not inside. Avoid bright or  fluorescent light, but do not dim environment to the point that going into a normally lit room hurts. Consider FL-41 tint lenses, which reduce the most irritating wavelengths without blocking too much light.  These can be obtained at axonoptics.com or theraspecs.com Foods: see list above.   2. Limit use of acute treatments (over-the-counter medications, triptans, etc.) to no more than 2 days per week or 10 days per month to prevent medication overuse headache (rebound headache).     3. Follow a regular schedule (including weekends and holidays): Don't skip meals. Eat a balanced diet. 8 hours of sleep nightly. Minimize stress. Exercise 30 minutes per day. Being overweight is associated with a 5 times increased risk of chronic migraine. Keep well hydrated and drink 6-8 glasses of water per day.   4. Initiate non-pharmacologic measures at the earliest onset of your headache. Rest and quiet environment. Relax and reduce stress. Breathe2Relax is a free app that can instruct you on    some simple relaxtion and breathing techniques. Http://Dawnbuse.com is a    free website that provides teaching videos on relaxation.  Also, there are  many apps that   can be downloaded for "mindful" relaxation.  An app called YOGA NIDRA will help walk you through mindfulness. Another app called Calm can be downloaded to give you a structured mindfulness guide with daily reminders and skill development. Headspace for guided meditation Mindfulness Based Stress Reduction Online Course: www.palousemindfulness.com Cold compresses.   5. Don't wait!! Take the maximum allowable dosage of prescribed medication at the first sign of migraine.   6. Compliance:  Take prescribed medication regularly as directed and at the first sign of a migraine.   7. Communicate:  Call your physician when problems arise, especially if your headaches change, increase in frequency/severity, or become associated with neurological symptoms (weakness,  numbness, slurred speech, etc.). Proceed to emergency room if you experience new or worsening symptoms or symptoms do not resolve, if you have new neurologic symptoms or if headache is severe, or for any concerning symptom.   8. Headache/pain management therapies: Consider various complementary methods, including medication, behavioral therapy, psychological counselling, biofeedback, massage therapy, acupuncture, dry needling, and other modalities.  Such measures may reduce the need for medications. Counseling for pain management, where patients learn to function and ignore/minimize their pain, seems to work very well.   9. Recommend changing family's attention and focus away from patient's headaches. Instead, emphasize daily activities. If first question of day is 'How are your headaches/Do you have a headache today?', then patient will constantly think about headaches, thus making them worse. Goal is to re-direct attention away from headaches, toward daily activities and other distractions.   10. Helpful Websites: www.AmericanHeadacheSociety.org PatentHood.ch www.headaches.org TightMarket.nl www.achenet.org

## 2024-01-22 ENCOUNTER — Telehealth: Payer: Self-pay | Admitting: Cardiology

## 2024-01-22 NOTE — Telephone Encounter (Signed)
   Pre-operative Risk Assessment    Patient Name: [name]  DOB: 31-Dec-1950 MRN: [id number]      Request for Surgical Clearance    Procedure:  Left total hip replacement  Date of Surgery:  Clearance TBD                                 Surgeon:  Neil Balls Surgeon's Group or Practice Name:  [facility] Phone number:  [phone]  Fax number:  [phone]   Type of Clearance Requested:   - Medical & pharmacy hold but unclear on medications   Type of Anesthesia:  Spinal   Additional requests/questions:  None  Signed, April L Harrington   01/22/2024, 3:47 PM

## 2024-01-23 NOTE — Telephone Encounter (Signed)
   Name: [name]  DOB: 1950-11-13  MRN: [id number]  Primary Cardiologist: Gaylyn Keas, MD   Preoperative team, please contact this patient and set up a phone call appointment for further preoperative risk assessment. Please obtain consent and complete medication review. Thank you for your help.  I confirm that guidance regarding antiplatelet and oral anticoagulation therapy has been completed and, if necessary, noted below.  Ideally aspirin  should be continued without interruption, however if the bleeding risk is too great, aspirin  may be held for 5-7 days prior to surgery. Please resume aspirin  post operatively when it is felt to be safe from a bleeding standpoint.    I also confirmed the patient resides in the state of [place] . As per [facility] Medical Board telemedicine laws, the patient must reside in the state in which the provider is licensed.   Morey Ar, NP 01/23/2024, 1:13 PM [facility]

## 2024-01-23 NOTE — Telephone Encounter (Signed)
 Mailbox full, could not leave message to call back to schedule tele preop appt.

## 2024-01-24 ENCOUNTER — Telehealth: Payer: Self-pay | Admitting: *Deleted

## 2024-01-24 NOTE — Telephone Encounter (Signed)
  Form faxed to [phone], confirmation received.

## 2024-01-25 NOTE — Telephone Encounter (Signed)
 Fax below sent to our office fax by mistake. Resent to [phone]. Received fax confirmation.

## 2024-01-25 NOTE — Telephone Encounter (Signed)
 2nd attempt contacting patient tried home and cell numbers no answer wasn't able to leave vm will try again later

## 2024-01-26 ENCOUNTER — Telehealth: Payer: Self-pay

## 2024-01-26 NOTE — Telephone Encounter (Addendum)
 S/W pt and scheduled TELE Preop appt  for 02/06/24.  Med rec and consent done   Will update surgeons office.

## 2024-01-26 NOTE — Telephone Encounter (Signed)
 Med Rec and consent done.     Patient Consent for Virtual Visit        [name] has provided verbal consent on 01/26/2024 for a virtual visit (video or telephone).   CONSENT FOR VIRTUAL VISIT FOR:  [name]  By participating in this virtual visit I agree to the following:  I hereby voluntarily request, consent and authorize [facility] and its employed or contracted physicians, physician assistants, nurse practitioners or other licensed health care professionals (the Practitioner), to provide me with telemedicine health care services (the "Services) as deemed necessary by the treating Practitioner. I acknowledge and consent to receive the Services by the Practitioner via telemedicine. I understand that the telemedicine visit will involve communicating with the Practitioner through live audiovisual communication technology and the disclosure of certain medical information by electronic transmission. I acknowledge that I have been given the opportunity to request an in-person assessment or other available alternative prior to the telemedicine visit and am voluntarily participating in the telemedicine visit.  I understand that I have the right to withhold or withdraw my consent to the use of telemedicine in the course of my care at any time, without affecting my right to future care or treatment, and that the Practitioner or I may terminate the telemedicine visit at any time. I understand that I have the right to inspect all information obtained and/or recorded in the course of the telemedicine visit and may receive copies of available information for a reasonable fee.  I understand that some of the potential risks of receiving the Services via telemedicine include:  Delay or interruption in medical evaluation due to technological equipment failure or disruption; Information transmitted may not be sufficient (e.g. poor resolution of images) to allow for appropriate medical  decision making by the Practitioner; and/or  In rare instances, security protocols could fail, causing a breach of personal health information.  Furthermore, I acknowledge that it is my responsibility to provide information about my medical history, conditions and care that is complete and accurate to the best of my ability. I acknowledge that Practitioner's advice, recommendations, and/or decision may be based on factors not within their control, such as incomplete or inaccurate data provided by me or distortions of diagnostic images or specimens that may result from electronic transmissions. I understand that the practice of medicine is not an exact science and that Practitioner makes no warranties or guarantees regarding treatment outcomes. I acknowledge that a copy of this consent can be made available to me via my patient portal [facility] MyChart), or I can request a printed copy by calling the office of [facility].    I understand that my insurance will be billed for this visit.   I have read or had this consent read to me. I understand the contents of this consent, which adequately explains the benefits and risks of the Services being provided via telemedicine.  I have been provided ample opportunity to ask questions regarding this consent and the Services and have had my questions answered to my satisfaction. I give my informed consent for the services to be provided through the use of telemedicine in my medical care

## 2024-02-06 ENCOUNTER — Ambulatory Visit: Attending: Cardiology

## 2024-02-06 DIAGNOSIS — Z01810 Encounter for preprocedural cardiovascular examination: Secondary | ICD-10-CM

## 2024-02-06 NOTE — Progress Notes (Signed)
 Virtual Visit via Telephone Note   Because of [name] co-morbid illnesses, she is at least at moderate risk for complications without adequate follow up.  This format is felt to be most appropriate for this patient at this time.  Due to technical limitations with video connection Web designer), today's appointment will be conducted as an audio only telehealth visit, and [name] verbally agreed to proceed in this manner.   All issues noted in this document were discussed and addressed.  No physical exam could be performed with this format.  Evaluation Performed:  Preoperative cardiovascular risk assessment _____________   Date:  02/06/2024   Patient ID:  [name], DOB February 01, 1951, MRN [id number] Patient Location:  Home Provider location:   Office  Primary Care Provider:  [name], MD Primary Cardiologist:  Wilbert Bihari, MD  Chief Complaint / Patient Profile   73 y.o. y/o female with a h/o nonobstructive CAD, DM type II, HTN, HLD, hypothyroid, CKD stage III, GERD, CVA, PACs who is pending left total hip replacement and presents today for telephonic preoperative cardiovascular risk assessment.  History of Present Illness    [name] is a 73 y.o. female who presents via audio/video conferencing for a telehealth visit today.  Pt was last seen in cardiology clinic on 07/12/2023 by Rollo Louder, PA.  At that time [name] was doing well with no recurrence of syncope.  No medication changes made at that time.  She was seen in the ED on 11/11/2023 with complaint of near syncope after taking a muscle relaxer.  CT of the head and neck were negative for acute abnormalities and patient was discharged in stable condition.  The patient is now pending procedure as outlined above. Since her last visit, she has been doing good with no new cardiac complaints.  She reports having no episodes of dizziness since her incident on April 5 that sent her to the ED.  She does endorse  increased hip pain but is able to complete greater than 4 METS of activity without any difficulty.   She denies chest pain, shortness of breath, lower extremity edema, fatigue, palpitations, melena, hematuria, hemoptysis, diaphoresis, weakness, presyncope, syncope, orthopnea, and PND.     Past Medical History    Past Medical History:  Diagnosis Date   Anemia    iron deficiency   Blood transfusion    CAD (coronary artery disease), native coronary artery    coronary CTA showed a coronary Ca score of 506, mild CAD with 25-49% mRCA and moderate CAD with 50-69% sequential mRCA, 25-49% inferior sub branch of D1 and 25-49% in pLCx.     Cervical spondylosis with myelopathy and radiculopathy    CVA (cerebral vascular accident) (HCC)    11/2017 - no weakness or paralysis   Depression    Diabetes mellitus    Type 1   Diastolic dysfunction    Diverticulitis    DVT (deep venous thrombosis) (HCC)    times 2 lower leg   Endometriosis    Fibromyalgia    GERD (gastroesophageal reflux disease)    occ   Hiatal hernia    History of kidney stones    Hyperlipidemia    Hypertension    Hypothyroidism    Mild aortic insufficiency    by echo 04/2013   Osteoarthritis    back and knee   Ovarian cyst    RLS (restless legs syndrome)    Thoracic compression fracture (HCC)    Villous  adenoma of right colon 04/08/2016   Vitamin D  deficiency disease    Past Surgical History:  Procedure Laterality Date   ANTERIOR CERVICAL DECOMP/DISCECTOMY FUSION N/A 03/23/2017   Procedure: ANTERIOR CERVICAL DECOMPRESSION/DISCECTOMY FUSION, INTERBODY PROSTHESIS,PLATE CERVICAL THREE- CERVICAL FOUR, CERVICAL FOUR- CERVICAL FIVE, CERVICAL FIVE- CERVICAL SIX;  Surgeon: Mavis Purchase, MD;  Location: [facility] OR;  Service: Neurosurgery;  Laterality: N/A;   ANTERIOR CERVICAL DECOMP/DISCECTOMY FUSION N/A 04/18/2019   Procedure: ANTERIOR CERVICAL DECOMPRESSION/DISCECTOMY  CERVICAL SIX- CERVICAL SEVEN;  Surgeon: Mavis Purchase,  MD;  Location: [facility] OR;  Service: Neurosurgery;  Laterality: N/A;  anterior   APPENDECTOMY     BACK SURGERY  2005   April  2013 - [facility]   bowel blockage surgery     BREAST SURGERY     breast reduction   CARDIAC CATHETERIZATION  04/2013   normal coronary arteries and normal LVF   CARPAL TUNNEL RELEASE  06/05/2012   Procedure: CARPAL TUNNEL RELEASE;  Surgeon: Lamar LULLA Leonor Mickey., MD;  Location: [facility];  Service: Orthopedics;  Laterality: Left;   CESAREAN SECTION     x 2   CHOLECYSTECTOMY     COLECTOMY  04/08/2016   DILATION AND CURETTAGE OF UTERUS     DORSAL COMPARTMENT RELEASE  06/05/2012   Procedure: RELEASE DORSAL COMPARTMENT (DEQUERVAIN);  Surgeon: Lamar LULLA Leonor Mickey., MD;  Location: [facility];  Service: Orthopedics;  Laterality: Left;  Excision of mixoid cyst also   EYE SURGERY     cataracts bilateral   INCISIONAL HERNIA REPAIR N/A 05/18/2022   Procedure: REPAIR INCISIONAL HERNIA WITH MESH;  Surgeon: Vanderbilt Ned, MD;  Location: [facility] OR;  Service: General;  Laterality: N/A;   JOINT REPLACEMENT     Bilateral knee   KNEE ARTHROPLASTY  2009   lft partial   KNEE ARTHROPLASTY     rt   LAPAROSCOPIC PARTIAL COLECTOMY N/A 04/08/2016   Procedure: LAPAROSCOPIC ASSISTED ASCENDING COLECTOMY POSSIBLE OPEN COLECTOMY;  Surgeon: Elon Pacini, MD;  Location: [facility] OR;  Service: General;  Laterality: N/A;   orthopedic surgeries     multiple   perforated bowl     Sep 09 2020   REDUCTION MAMMAPLASTY Bilateral    SHOULDER OPEN ROTATOR CUFF REPAIR     rt and lft   TEE WITHOUT CARDIOVERSION N/A 11/23/2017   Procedure: TRANSESOPHAGEAL ECHOCARDIOGRAM (TEE);  Surgeon: Jeffrie Oneil BROCKS, MD;  Location: [facility] ENDOSCOPY;  Service: Cardiovascular;  Laterality: N/A;   TUBAL LIGATION     btsp    Allergies  Allergies  Allergen Reactions   Dilaudid  [Hydromorphone  Hcl] Other (See Comments)    Hallucinations/ argumentative, goes beserk   Heparin  Other (See  Comments)    HIT ab positive  (SRA negative 11/25/17)    Ambien  [Zolpidem  Tartrate] Other (See Comments)    Up sleep walking and eating   Belsomra [Suvorexant]     ineffective Other reaction(s): Other ineffective   Codeine Nausea And Vomiting   Morphine  And Codeine Other (See Comments)    Flushing and feeling hot Tolerates with Diphenhydramine    Penicillins Rash and Other (See Comments)    Caused Headaches also Has patient had a PCN reaction causing immediate rash, facial/tongue/throat swelling, SOB or lightheadedness with hypotension: No Has patient had a PCN reaction causing severe rash involving mucus membranes or skin necrosis: No Has patient had a PCN reaction that required hospitalization No Has patient had a PCN reaction occurring within the last 10 years: No If all of  the above answers are NO, then may proceed with Cephalosporin use.     Statins Other (See Comments)    Muscle weakness, cramps and aching all over body   Ceftin [Cefuroxime Axetil] Other (See Comments)    Stomach pain    Elavil [Amitriptyline] Other (See Comments)    Loopy feeling   Lovaza  [Omega-3-Acid  Ethyl Esters (Fish)] Other (See Comments)    Stomach pain    Lunesta [Eszopiclone] Other (See Comments)    Causes dizziness   Lyrica [Pregabalin] Nausea Only   Metformin  And Related Nausea Only    Home Medications    Prior to Admission medications   Medication Sig Start Date End Date Taking? Authorizing Provider  acetaminophen  (TYLENOL ) 500 MG tablet Take 1,000 mg by mouth every 8 (eight) hours as needed for mild pain or headache.    [provider]  amLODipine  (NORVASC ) 2.5 MG tablet Take 2 tablets (5 mg total) by mouth daily. 05/25/21   Shlomo Wilbert SAUNDERS, MD  aspirin  EC 81 MG tablet Take 81 mg by mouth daily.    [provider]  Cholecalciferol (VITAMIN D3) 125 MCG (5000 UT) TABS Take 5,000 Units by mouth in the morning.    [provider]  Clobetasol  Propionate (TEMOVATE )  0.05 % external spray Apply 1 spray topically at bedtime as needed (scalp eczema).    [provider]  escitalopram  (LEXAPRO ) 5 MG tablet Take 5 mg by mouth daily. 03/24/22   [provider]  gabapentin  (NEURONTIN ) 300 MG capsule Take 900 mg by mouth at bedtime.    [provider]  HUMALOG  100 UNIT/ML injection Inject 15 Units into the skin with breakfast, with lunch, and with evening meal. 02/03/22   [provider]  ibandronate (BONIVA) 150 MG tablet Take 150 mg by mouth every 30 (thirty) days. 03/06/19   [provider]  imipramine  (TOFRANIL ) 50 MG tablet Take 1 tablet (50 mg total) by mouth daily. 01/16/24   Lomax, Amy, NP  insulin  glargine (LANTUS ) 100 UNIT/ML injection Inject 44 Units into the skin in the morning.    [provider]  irbesartan -hydrochlorothiazide  (AVALIDE) 300-12.5 MG tablet Take 1 tablet by mouth in the morning.    [provider]  Levocetirizine Dihydrochloride (XYZAL PO) Take by mouth at bedtime.    [provider]  levothyroxine  (SYNTHROID ) 150 MCG tablet Take 150 mcg by mouth daily before breakfast.    [provider]  LORazepam  (ATIVAN ) 0.5 MG tablet Take 0.5-1 mg by mouth every 8 (eight) hours as needed for anxiety. 03/15/19   [provider]  Magnesium  250 MG TABS Take 250 mg by mouth in the morning.    [provider]  metoprolol  succinate (TOPROL -XL) 25 MG 24 hr tablet TAKE 1 TABLET (25 MG TOTAL) BY MOUTH DAILY. 06/27/23   Shlomo Wilbert SAUNDERS, MD  pantoprazole  (PROTONIX ) 40 MG tablet Take 40 mg by mouth daily before breakfast. 07/03/19   [provider]  Potassium 99 MG TABS Take 99 mg by mouth in the morning.    [provider]  Probiotic Product (PROBIOTIC PO) Take 1 capsule by mouth in the morning.    [provider]  promethazine  (PHENERGAN ) 25 MG tablet Take 25 mg by mouth every 8 (eight) hours as needed for nausea or vomiting.    [provider]  rosuvastatin  (CRESTOR ) 20 MG tablet Take 20 mg by mouth in the morning.    [provider]  temazepam  (RESTORIL ) 15 MG capsule Take  15 mg by mouth at bedtime.  10/10/17   [provider]  tiZANidine  (ZANAFLEX ) 4 MG tablet Take 4 mg by mouth every 6 (six) hours as needed for muscle spasms. 04/19/22   [provider]  vitamin B-12 (CYANOCOBALAMIN ) 1000 MCG tablet Take 1,000 mcg by mouth in the morning.    [provider]  zinc  sulfate 220 (50 Zn) MG capsule Take 220 mg by mouth in the morning.    [provider]    Physical Exam    Vital Signs:  [name] does not have vital signs available for review today.  Given telephonic nature of communication, physical exam is limited. AAOx3. NAD. Normal affect.  Speech and respirations are unlabored.  Accessory Clinical Findings    None  Assessment & Plan    1.  Preoperative Cardiovascular Risk Assessment: - Patient's RCRI score is 6.6%  The patient affirms she has been doing well without any new cardiac symptoms. They are able to achieve 5 METS without cardiac limitations. Therefore, based on ACC/AHA guidelines, the patient would be at acceptable risk for the planned procedure without further cardiovascular testing. The patient was advised that if she develops new symptoms prior to surgery to contact our office to arrange for a follow-up visit, and she verbalized understanding.   The patient was advised that if she develops new symptoms prior to surgery to contact our office to arrange for a follow-up visit, and she verbalized understanding.  Patient can hold aspirin  81 mg  for 5-7 days prior to surgery.   A copy of this note will be routed to requesting surgeon.  Time:   Today, I have spent 6 minutes with the patient with telehealth technology discussing medical history, symptoms, and management plan.     Wyn Raddle, Jackee Shove, NP  02/06/2024, 7:21 AM

## 2024-03-18 ENCOUNTER — Other Ambulatory Visit: Payer: Self-pay | Admitting: Orthopedic Surgery

## 2024-03-19 NOTE — Progress Notes (Addendum)
 COVID Vaccine received:  []  No [x]  Yes Date of any COVID positive Test in last 90 days:  PCP - [name], MD at [place] [phone]  Cardiologist - Wilbert Bihari, MD, Jackee Alberts, NP cardiac clearance in 02-06-24 Epic note Endocrinology-  [name] FNP at [facility]. [phone]   Chest x-ray - 12-2022  repeat ordered by Graves,  EKG - 11-15-2023  Epic  Stress Test -  ECHO - 06-27-2023 Epic  Cardiac Cath - 05-03-2013 [facility] by Dr. Jeffrie CT Coronary Calcium  score: 506 on 01-13-2020 Epic  Pacemaker / ICD device [x]  No []  Yes   Spinal Cord Stimulator:[x]  No []  Yes    Has Insulin  Pump (Medtronic MiniMed)- [facility] DM Coordinator, Roanne was notified of this case.      History of Sleep Apnea? [x]  No []  Yes   CPAP used?- [x]  No []  Yes    Patient has: []  NO Hx DM   []  Pre-DM   [x]  DM1  []   DM2 Does the patient monitor blood sugar?   []  N/A   []  No [x]  Yes  Last A1c was: 8.3   on  02-01-24   will redraw at PST per Burnard Senna PA-C   Does patient have a Freestyle Libre or Dexcom? []  No []  Yes   Fasting Blood Sugar Ranges-  Checks Blood Sugar continuous times a day Patient has an INSULIN  PUMP on Humalog   Blood Thinner / Instructions: None Aspirin  Instructions:  ASA 81mg    ok to hold x 1 week per Cardiology  ERAS Protocol Ordered: []  No  [x]  Yes PRE-SURGERY []  ENSURE  [x]  G2    Patient is to be NPO after: 0700  Dental hx: []  Dentures:  [x]  N/A      []  Bridge or Partial:                   []  Loose or Damaged teeth:   Comments: Patient was given the 5 CHG shower / bath instructions for THA surgery along with 2 bottles of the CHG soap. Patient will start this on:   Thursday 03-28-24           Activity level: Able to walk up 2 flights of stairs without becoming significantly short of breath or having chest pain?  [x]  No  SOB and severe leg pain  []    Yes  Patient can perform ADLs without assistance. []  No   [x]   Yes  Anesthesia review: DM1- Insulin  Pump, CAD, CHF, Hx CVA (11-2017), Hx DVT x  2 in LE, CKD3, Anemia, RLS, s/p ACDF x 2 (C3 to C7, 2018 & 2020)   Patient denies any S&S of respiratory illness or Covid - no shortness of breath, fever, cough or chest pain at PAT appointment.  Patient verbalized understanding and agreement to the Pre-Surgical Instructions that were given to them at this PAT appointment. Patient was also educated of the need to review these PAT instructions again prior to her surgery.I reviewed the appropriate phone numbers to call if they have any and questions or concerns.

## 2024-03-19 NOTE — Patient Instructions (Addendum)
 SURGICAL WAITING ROOM VISITATION Patients having surgery or a procedure may have no more than 2 support people in the waiting area - these visitors may rotate in the visitor waiting room.   If the patient needs to stay at the hospital during part of their recovery, the visitor guidelines for inpatient rooms apply.  PRE-OP VISITATION  Pre-op nurse will coordinate an appropriate time for 1 support person to accompany the patient in pre-op.  This support person may not rotate.  This visitor will be contacted when the time is appropriate for the visitor to come back in the pre-op area.  Please refer to the [facility] website for the visitor guidelines for Inpatients (after your surgery is over and you are in a regular room).  You are not required to quarantine at this time prior to your surgery. However, you must do this: Hand Hygiene often Do NOT share personal items Notify your provider if you are in close contact with someone who has COVID or you develop fever 100.4 or greater, new onset of sneezing, cough, sore throat, shortness of breath or body aches.  If you test positive for Covid or have been in contact with anyone that has tested positive in the last 10 days please notify you surgeon.    Your procedure is scheduled on:  MONDAY  April 01, 2024  Report to [facility] Main Entrance: [address] entrance where the [employer] is available.   Report to admitting at:  07:30   AM  Call this number if you have any questions or problems the morning of surgery [phone]  Do not eat food after Midnight the night prior to your surgery/procedure.  After Midnight you may have the following liquids until  07:00 AM DAY OF SURGERY  Clear Liquid Diet Water Black Coffee (sugar ok, NO MILK/CREAM OR CREAMERS)  Tea (sugar ok, NO MILK/CREAM OR CREAMERS) regular and decaf                             Plain Jell-O  with no fruit (NO RED)                                           Fruit ices  (not with fruit pulp, NO RED)                                     Popsicles (NO RED)                                                                  Juice: NO CITRUS JUICES: only apple, WHITE grape, WHITE cranberry Sports drinks like Gatorade or Powerade (NO RED)                   The day of surgery:  Drink ONE (1) Pre-Surgery Clear G2 at 07:00  AM the morning of surgery. Drink in one sitting. Do not sip.  This drink was given to you during your hospital pre-op appointment visit. Nothing else to drink after completing  the Pre-Surgery G2 : No candy, chewing gum or throat lozenges.    FOLLOW ANY ADDITIONAL PRE OP INSTRUCTIONS YOU RECEIVED FROM YOUR SURGEON'S OFFICE!!!   Oral Hygiene is also important to reduce your risk of infection.        Remember - BRUSH YOUR TEETH THE MORNING OF SURGERY WITH YOUR REGULAR TOOTHPASTE  Do NOT smoke after Midnight the night before surgery.  Patients with Insulin  Pumps  For patients with Insulin  Pumps: Contact your diabetes doctor for specific instructions before surgery. Decrease basal insulin  rates by 20% at midnight the night before surgery. Do not remove your insulin  pump prior to arrival to short stay the morning of surgery.  Anesthesia will instruct you on when to remove your pump. Note that if your surgery is planned to be longer than 2 hours, your insulin  pump will be removed and intravenous (IV) insulin  will be started and managed by the nurses and anesthesiologist. You will be able to restart your insulin  pump once you are awake and able to manage it. Make sure to bring insulin  pump supplies to the hospital with you in case your site needs to be changed.   STOP TAKING all Vitamins, Herbs and supplements 1 week before your surgery.   Take ONLY these medicines the morning of surgery with A SIP OF WATER: levothyroxine , amlodipine , metoprolol  and cetirizine. You may take Lorazepam  and EITHER Tylenol  OR Tramadol  if needed.   If You have been  diagnosed with Sleep Apnea - Bring CPAP mask and tubing day of surgery. We will provide you with a CPAP machine on the day of your surgery.                   You may not have any metal on your body including hair pins, jewelry, and body piercing  Do not wear make-up, lotions, powders, perfumes or deodorant  Do not wear nail polish including gel and S&S, artificial / acrylic nails, or any other type of covering on natural nails including finger and toenails. If you have artificial nails, gel coating, etc., that needs to be removed by a nail salon, Please have this removed prior to surgery. Not doing so may mean that your surgery could be cancelled or delayed if the Surgeon or anesthesia staff feels like they are unable to monitor you safely.   Do not shave 48 hours prior to surgery to avoid nicks in your skin which may contribute to postoperative infections.   Contacts, Hearing Aids, dentures or bridgework may not be worn into surgery. DENTURES WILL BE REMOVED PRIOR TO SURGERY PLEASE DO NOT APPLY Poly grip OR ADHESIVES!!!  Patients discharged on the day of surgery will not be allowed to drive home.  Someone NEEDS to stay with you for the first 24 hours after anesthesia.  Do not bring your home medications to the hospital. The Pharmacy will dispense medications listed on your medication list to you during your admission in the Hospital.  Please read over the following fact sheets you were given: IF YOU HAVE QUESTIONS ABOUT YOUR PRE-OP INSTRUCTIONS, PLEASE CALL [phone].     Pre-operative 5 CHG Bath Instructions   You can play a key role in reducing the risk of infection after surgery. Your skin needs to be as free of germs as possible. You can reduce the number of germs on your skin by washing with CHG (chlorhexidine  gluconate) soap before surgery. CHG is an antiseptic soap that kills germs and continues to kill germs even  after washing.   DO NOT use if you have an allergy to  chlorhexidine /CHG or antibacterial soaps. If your skin becomes reddened or irritated, stop using the CHG and notify one of our RNs at [phone]  Please shower with the CHG soap starting 4 days before surgery using the following schedule: START SHOWERS ON    THURSDAY  March 28, 2024                                                                                                                                                                              Please keep in mind the following:  DO NOT shave, including legs and underarms, starting the day of your first shower.   You may shave your face at any point before/day of surgery.   Place clean sheets on your bed the day you start using CHG soap. Use a clean washcloth (not used since being washed) for each shower. DO NOT sleep with pets once you start using the CHG.   CHG Shower Instructions:  If you choose to wash your hair and private area, wash first with your normal shampoo/soap.  After you use shampoo/soap, rinse your hair and body thoroughly to remove shampoo/soap residue.  Turn the water OFF and apply about 3 tablespoons (45 ml) of CHG soap to a CLEAN washcloth.  Apply CHG soap ONLY FROM YOUR NECK DOWN TO YOUR TOES (washing for 3-5 minutes)  DO NOT use CHG soap on face, private areas, open wounds, or sores.  Pay special attention to the area where your surgery is being performed.  If you are having back surgery, having someone wash your back for you may be helpful.  Wait 2 minutes after CHG soap is applied, then you may rinse off the CHG soap.  Pat dry with a clean towel  Put on clean clothes/pajamas   If you choose to wear lotion, please use ONLY the CHG-compatible lotions on the back of this paper.     Additional instructions for the day of surgery: DO NOT APPLY any lotions, deodorants, cologne, or perfumes.   Put on clean/comfortable clothes.  Brush your teeth.  Ask your nurse before applying any prescription medications  to the skin.      CHG Compatible Lotions   Aveeno Moisturizing lotion  Cetaphil Moisturizing Cream  Cetaphil Moisturizing Lotion  Clairol Herbal Essence Moisturizing Lotion, Dry Skin  Clairol Herbal Essence Moisturizing Lotion, Extra Dry Skin  Clairol Herbal Essence Moisturizing Lotion, Normal Skin  Curel Age Defying Therapeutic Moisturizing Lotion with Alpha Hydroxy  Curel Extreme Care Body Lotion  Curel Soothing Hands Moisturizing Hand Lotion  Curel Therapeutic Moisturizing Cream, Fragrance-Free  Curel Therapeutic Moisturizing Lotion, Fragrance-Free  Curel  Therapeutic Moisturizing Lotion, Original Formula  Eucerin Daily Replenishing Lotion  Eucerin Dry Skin Therapy Plus Alpha Hydroxy Crme  Eucerin Dry Skin Therapy Plus Alpha Hydroxy Lotion  Eucerin Original Crme  Eucerin Original Lotion  Eucerin Plus Crme Eucerin Plus Lotion  Eucerin TriLipid Replenishing Lotion  Keri Anti-Bacterial Hand Lotion  Keri Deep Conditioning Original Lotion Dry Skin Formula Softly Scented  Keri Deep Conditioning Original Lotion, Fragrance Free Sensitive Skin Formula  Keri Lotion Fast Absorbing Fragrance Free Sensitive Skin Formula  Keri Lotion Fast Absorbing Softly Scented Dry Skin Formula  Keri Original Lotion  Keri Skin Renewal Lotion Keri Silky Smooth Lotion  Keri Silky Smooth Sensitive Skin Lotion  Nivea Body Creamy Conditioning Oil  Nivea Body Extra Enriched Lotion  Nivea Body Original Lotion  Nivea Body Sheer Moisturizing Lotion Nivea Crme  Nivea Skin Firming Lotion  NutraDerm 30 Skin Lotion  NutraDerm Skin Lotion  NutraDerm Therapeutic Skin Cream  NutraDerm Therapeutic Skin Lotion  ProShield Protective Hand Cream  Provon moisturizing lotion   FAILURE TO FOLLOW THESE INSTRUCTIONS MAY RESULT IN THE CANCELLATION OF YOUR SURGERY  PATIENT SIGNATURE_________________________________  NURSE  SIGNATURE__________________________________  ________________________________________________________________________       Nasario Exon    An incentive spirometer is a tool that can help keep your lungs clear and active. This tool measures how well you are filling your lungs with each breath. Taking long deep breaths may help reverse or decrease the chance of developing breathing (pulmonary) problems (especially infection) following: A long period of time when you are unable to move or be active. BEFORE THE PROCEDURE  If the spirometer includes an indicator to show your best effort, your nurse or respiratory therapist will set it to a desired goal. If possible, sit up straight or lean slightly forward. Try not to slouch. Hold the incentive spirometer in an upright position. INSTRUCTIONS FOR USE  Sit on the edge of your bed if possible, or sit up as far as you can in bed or on a chair. Hold the incentive spirometer in an upright position. Breathe out normally. Place the mouthpiece in your mouth and seal your lips tightly around it. Breathe in slowly and as deeply as possible, raising the piston or the ball toward the top of the column. Hold your breath for 3-5 seconds or for as long as possible. Allow the piston or ball to fall to the bottom of the column. Remove the mouthpiece from your mouth and breathe out normally. Rest for a few seconds and repeat Steps 1 through 7 at least 10 times every 1-2 hours when you are awake. Take your time and take a few normal breaths between deep breaths. The spirometer may include an indicator to show your best effort. Use the indicator as a goal to work toward during each repetition. After each set of 10 deep breaths, practice coughing to be sure your lungs are clear. If you have an incision (the cut made at the time of surgery), support your incision when coughing by placing a pillow or rolled up towels firmly against it. Once you are able to  get out of bed, walk around indoors and cough well. You may stop using the incentive spirometer when instructed by your caregiver.  RISKS AND COMPLICATIONS Take your time so you do not get dizzy or light-headed. If you are in pain, you may need to take or ask for pain medication before doing incentive spirometry. It is harder to take a deep breath if you are having pain.  AFTER USE Rest and breathe slowly and easily. It can be helpful to keep track of a log of your progress. Your caregiver can provide you with a simple table to help with this. If you are using the spirometer at home, follow these instructions: SEEK MEDICAL CARE IF:  You are having difficultly using the spirometer. You have trouble using the spirometer as often as instructed. Your pain medication is not giving enough relief while using the spirometer. You develop fever of 100.5 F (38.1 C) or higher.                                                                                                    SEEK IMMEDIATE MEDICAL CARE IF:  You cough up bloody sputum that had not been present before. You develop fever of 102 F (38.9 C) or greater. You develop worsening pain at or near the incision site. MAKE SURE YOU:  Understand these instructions. Will watch your condition. Will get help right away if you are not doing well or get worse. Document Released: 12/05/2006 Document Revised: 10/17/2011 Document Reviewed: 02/05/2007 [facility] Patient Information 2014 [place], [place].        WHAT IS A BLOOD TRANSFUSION? Blood Transfusion Information  A transfusion is the replacement of blood or some of its parts. Blood is made up of multiple cells which provide different functions. Red blood cells carry oxygen and are used for blood loss replacement. White blood cells fight against infection. Platelets control bleeding. Plasma helps clot blood. Other blood products are available for specialized needs, such as hemophilia or other  clotting disorders. BEFORE THE TRANSFUSION  Who gives blood for transfusions?  Healthy volunteers who are fully evaluated to make sure their blood is safe. This is blood bank blood. Transfusion therapy is the safest it has ever been in the practice of medicine. Before blood is taken from a donor, a complete history is taken to make sure that person has no history of diseases nor engages in risky social behavior (examples are intravenous drug use or sexual activity with multiple partners). The donor's travel history is screened to minimize risk of transmitting infections, such as malaria. The donated blood is tested for signs of infectious diseases, such as HIV and hepatitis. The blood is then tested to be sure it is compatible with you in order to minimize the chance of a transfusion reaction. If you or a relative donates blood, this is often done in anticipation of surgery and is not appropriate for emergency situations. It takes many days to process the donated blood. RISKS AND COMPLICATIONS Although transfusion therapy is very safe and saves many lives, the main dangers of transfusion include:  Getting an infectious disease. Developing a transfusion reaction. This is an allergic reaction to something in the blood you were given. Every precaution is taken to prevent this. The decision to have a blood transfusion has been considered carefully by your caregiver before blood is given. Blood is not given unless the benefits outweigh the risks. AFTER THE TRANSFUSION Right after receiving a blood transfusion, you will usually feel much  better and more energetic. This is especially true if your red blood cells have gotten low (anemic). The transfusion raises the level of the red blood cells which carry oxygen, and this usually causes an energy increase. The nurse administering the transfusion will monitor you carefully for complications. HOME CARE INSTRUCTIONS  No special instructions are needed after a  transfusion. You may find your energy is better. Speak with your caregiver about any limitations on activity for underlying diseases you may have. SEEK MEDICAL CARE IF:  Your condition is not improving after your transfusion. You develop redness or irritation at the intravenous (IV) site. SEEK IMMEDIATE MEDICAL CARE IF:  Any of the following symptoms occur over the next 12 hours: Shaking chills. You have a temperature by mouth above 102 F (38.9 C), not controlled by medicine. Chest, back, or muscle pain. People around you feel you are not acting correctly or are confused. Shortness of breath or difficulty breathing. Dizziness and fainting. You get a rash or develop hives. You have a decrease in urine output. Your urine turns a dark color or changes to pink, red, or brown. Any of the following symptoms occur over the next 10 days: You have a temperature by mouth above 102 F (38.9 C), not controlled by medicine. Shortness of breath. Weakness after normal activity. The white part of the eye turns yellow (jaundice). You have a decrease in the amount of urine or are urinating less often. Your urine turns a dark color or changes to pink, red, or brown. Document Released: 07/22/2000 Document Revised: 10/17/2011 Document Reviewed: 03/10/2008 [facility] Patient Information 2014 [place], [place].  _______________________________________________________________________          If you would like to see a video about joint replacement:   [email or web address]

## 2024-03-20 ENCOUNTER — Other Ambulatory Visit: Payer: Self-pay

## 2024-03-20 ENCOUNTER — Encounter (HOSPITAL_COMMUNITY): Payer: Self-pay

## 2024-03-20 ENCOUNTER — Ambulatory Visit (HOSPITAL_COMMUNITY)
Admission: RE | Admit: 2024-03-20 | Discharge: 2024-03-20 | Disposition: A | Discharge status: Home / Self Care | Source: Ambulatory Visit | Attending: Orthopedic Surgery | Admitting: Orthopedic Surgery

## 2024-03-20 ENCOUNTER — Encounter (HOSPITAL_COMMUNITY): Payer: Self-pay | Admitting: Medical

## 2024-03-20 ENCOUNTER — Encounter (HOSPITAL_COMMUNITY)
Admission: RE | Admit: 2024-03-20 | Discharge: 2024-03-20 | Disposition: A | Discharge status: Home / Self Care | Source: Ambulatory Visit | Attending: Orthopedic Surgery | Admitting: Orthopedic Surgery

## 2024-03-20 VITALS — BP 130/79 | HR 86 | Temp 98.7°F | Resp 20 | Ht 63.0 in | Wt 203.0 lb

## 2024-03-20 DIAGNOSIS — M1612 Unilateral primary osteoarthritis, left hip: Secondary | ICD-10-CM

## 2024-03-20 DIAGNOSIS — K760 Fatty (change of) liver, not elsewhere classified: Secondary | ICD-10-CM | POA: Diagnosis present

## 2024-03-20 DIAGNOSIS — I10 Essential (primary) hypertension: Secondary | ICD-10-CM

## 2024-03-20 DIAGNOSIS — Z01818 Encounter for other preprocedural examination: Secondary | ICD-10-CM

## 2024-03-20 DIAGNOSIS — E109 Type 1 diabetes mellitus without complications: Secondary | ICD-10-CM | POA: Diagnosis present

## 2024-03-20 HISTORY — DX: Heart failure, unspecified: I50.9

## 2024-03-20 HISTORY — DX: Chronic kidney disease, unspecified: N18.9

## 2024-03-20 LAB — COMPREHENSIVE METABOLIC PANEL WITH GFR
ALT: 15 U/L (ref 0–44)
AST: 19 U/L (ref 15–41)
Albumin: 3.9 g/dL (ref 3.5–5.0)
Alkaline Phosphatase: 88 U/L (ref 38–126)
Anion gap: 8 (ref 5–15)
BUN: 24 mg/dL — ABNORMAL HIGH (ref 8–23)
CO2: 25 mmol/L (ref 22–32)
Calcium: 9.5 mg/dL (ref 8.9–10.3)
Chloride: 100 mmol/L (ref 98–111)
Creatinine, Ser: 1.17 mg/dL — ABNORMAL HIGH (ref 0.44–1.00)
GFR, Estimated: 49 mL/min — ABNORMAL LOW (ref >60)
Glucose, Bld: 235 mg/dL — ABNORMAL HIGH (ref 70–99)
Potassium: 3.9 mmol/L (ref 3.5–5.1)
Sodium: 133 mmol/L — ABNORMAL LOW (ref 135–145)
Total Bilirubin: 0.5 mg/dL (ref 0.0–1.2)
Total Protein: 7.3 g/dL (ref 6.5–8.1)

## 2024-03-20 LAB — CBC
HCT: 40.4 % (ref 36.0–46.0)
Hemoglobin: 12.8 g/dL (ref 12.0–15.0)
MCH: 26.5 pg (ref 26.0–34.0)
MCHC: 31.7 g/dL (ref 30.0–36.0)
MCV: 83.6 fL (ref 80.0–100.0)
Platelets: 237 K/uL (ref 150–400)
RBC: 4.83 MIL/uL (ref 3.87–5.11)
RDW: 13.1 % (ref 11.5–15.5)
WBC: 5.8 K/uL (ref 4.0–10.5)
nRBC: 0 % (ref 0.0–0.2)

## 2024-03-20 LAB — TYPE AND SCREEN
ABO/RH(D): O POS
Antibody Screen: NEGATIVE

## 2024-03-20 LAB — SURGICAL PCR SCREEN
MRSA, PCR: NEGATIVE
Staphylococcus aureus: NEGATIVE

## 2024-03-21 LAB — HEMOGLOBIN A1C
Hgb A1c MFr Bld: 8.7 % — ABNORMAL HIGH (ref 4.8–5.6)
Mean Plasma Glucose: 203 mg/dL

## 2024-04-01 ENCOUNTER — Ambulatory Visit (HOSPITAL_COMMUNITY): Admission: RE | Admit: 2024-04-01 | Source: Home / Self Care | Admitting: Orthopedic Surgery

## 2024-04-01 ENCOUNTER — Encounter (HOSPITAL_COMMUNITY): Admission: RE | Payer: Self-pay | Source: Home / Self Care

## 2024-04-01 SURGERY — ARTHROPLASTY, HIP, TOTAL, ANTERIOR APPROACH
Anesthesia: Spinal | Site: Hip | Laterality: Left

## 2024-04-24 ENCOUNTER — Other Ambulatory Visit: Payer: Medicare Other

## 2024-05-27 ENCOUNTER — Other Ambulatory Visit: Payer: Self-pay | Admitting: Family Medicine

## 2024-05-27 DIAGNOSIS — Z1231 Encounter for screening mammogram for malignant neoplasm of breast: Secondary | ICD-10-CM

## 2024-05-30 ENCOUNTER — Ambulatory Visit
Admission: RE | Admit: 2024-05-30 | Discharge: 2024-05-30 | Disposition: A | Discharge status: Home / Self Care | Source: Ambulatory Visit | Attending: Family Medicine | Admitting: Family Medicine

## 2024-05-30 DIAGNOSIS — Z1231 Encounter for screening mammogram for malignant neoplasm of breast: Secondary | ICD-10-CM

## 2024-06-19 ENCOUNTER — Telehealth: Payer: Self-pay

## 2024-06-19 NOTE — Telephone Encounter (Signed)
 Called [facility] and they stated tha all they need is just say treat for migraines. No specific questions and to sign

## 2024-06-20 ENCOUNTER — Telehealth (HOSPITAL_BASED_OUTPATIENT_CLINIC_OR_DEPARTMENT_OTHER): Payer: Self-pay | Admitting: *Deleted

## 2024-06-20 NOTE — Telephone Encounter (Signed)
   Name: [name]  DOB: [birth year]  MRN: [id number]  Primary Cardiologist: Wilbert Bihari, MD   Preoperative team, please contact this patient and set up a phone call appointment for further preoperative risk assessment. Please obtain consent and complete medication review. Thank you for your help.  I confirm that guidance regarding antiplatelet and oral anticoagulation therapy has been completed and, if necessary, noted below.  Her aspirin  may be held for 5 to 7 days prior to her surgery.  Please resume as soon as hemostasis is achieved.  I also confirmed the patient resides in the state of [place] . As per [facility] Medical Board telemedicine laws, the patient must reside in the state in which the provider is licensed.   Josefa CHRISTELLA Beauvais, NP 06/20/2024, 4:48 PM [facility]

## 2024-06-20 NOTE — Telephone Encounter (Signed)
   Pre-operative Risk Assessment    Patient Name: [name]  DOB: 03-Sep-1950 MRN: [id number]   Date of last office visit: 07/12/23 ROLLO LOUDER, [facility] Date of next office visit: NONE   Request for Surgical Clearance    Procedure:  LEFT TOTAL KNEE ARTHROPLASTY  Date of Surgery:  Clearance TBD                                Surgeon:  DR. YVONE Socks Group or Practice Name:  [facility] Phone number:  [phone] JUDY DANIELS Fax number:  [phone]   Type of Clearance Requested:   - Medical  - Pharmacy:  Hold Aspirin      Type of Anesthesia:  CHOICE   Additional requests/questions:    Bonney Niels Jest   06/20/2024, 10:19 AM

## 2024-06-21 ENCOUNTER — Telehealth (HOSPITAL_BASED_OUTPATIENT_CLINIC_OR_DEPARTMENT_OTHER): Payer: Self-pay | Admitting: *Deleted

## 2024-06-21 NOTE — Telephone Encounter (Signed)
 S/w the pt and she has been scheduled tele preop appt 06/26/24. Pt tells me that surgeon can do surgery 1st week of December if they can get clearance soon enough.   Med rec and consent are done.

## 2024-06-21 NOTE — Telephone Encounter (Signed)
 S/w the pt and she has been scheduled tele preop appt 06/26/24. Pt tells me that surgeon can do surgery 1st week of December if they can get clearance soon enough.   Med rec and consent are done.      Patient Consent for Virtual Visit        [name] has provided verbal consent on 06/21/2024 for a virtual visit (video or telephone).   CONSENT FOR VIRTUAL VISIT FOR:  [name]  By participating in this virtual visit I agree to the following:  I hereby voluntarily request, consent and authorize [facility] and its employed or contracted physicians, physician assistants, nurse practitioners or other licensed health care professionals (the Practitioner), to provide me with telemedicine health care services (the "Services) as deemed necessary by the treating Practitioner. I acknowledge and consent to receive the Services by the Practitioner via telemedicine. I understand that the telemedicine visit will involve communicating with the Practitioner through live audiovisual communication technology and the disclosure of certain medical information by electronic transmission. I acknowledge that I have been given the opportunity to request an in-person assessment or other available alternative prior to the telemedicine visit and am voluntarily participating in the telemedicine visit.  I understand that I have the right to withhold or withdraw my consent to the use of telemedicine in the course of my care at any time, without affecting my right to future care or treatment, and that the Practitioner or I may terminate the telemedicine visit at any time. I understand that I have the right to inspect all information obtained and/or recorded in the course of the telemedicine visit and may receive copies of available information for a reasonable fee.  I understand that some of the potential risks of receiving the Services via telemedicine include:  Delay or interruption in medical evaluation  due to technological equipment failure or disruption; Information transmitted may not be sufficient (e.g. poor resolution of images) to allow for appropriate medical decision making by the Practitioner; and/or  In rare instances, security protocols could fail, causing a breach of personal health information.  Furthermore, I acknowledge that it is my responsibility to provide information about my medical history, conditions and care that is complete and accurate to the best of my ability. I acknowledge that Practitioner's advice, recommendations, and/or decision may be based on factors not within their control, such as incomplete or inaccurate data provided by me or distortions of diagnostic images or specimens that may result from electronic transmissions. I understand that the practice of medicine is not an exact science and that Practitioner makes no warranties or guarantees regarding treatment outcomes. I acknowledge that a copy of this consent can be made available to me via my patient portal [facility] MyChart), or I can request a printed copy by calling the office of [facility].    I understand that my insurance will be billed for this visit.   I have read or had this consent read to me. I understand the contents of this consent, which adequately explains the benefits and risks of the Services being provided via telemedicine.  I have been provided ample opportunity to ask questions regarding this consent and the Services and have had my questions answered to my satisfaction. I give my informed consent for the services to be provided through the use of telemedicine in my medical care

## 2024-06-21 NOTE — Telephone Encounter (Signed)
 CORRECTION ON CLEARANCE ENTRY: SURGERY IS LEFT TOTAL HIP ; NOT KNEE, MY APOLOGIES.

## 2024-06-26 ENCOUNTER — Ambulatory Visit: Attending: Cardiovascular Disease | Admitting: Physician Assistant

## 2024-06-26 DIAGNOSIS — Z01810 Encounter for preprocedural cardiovascular examination: Secondary | ICD-10-CM

## 2024-06-26 NOTE — Progress Notes (Signed)
 Virtual Visit via Telephone Note   Because of [name] co-morbid illnesses, she is at least at moderate risk for complications without adequate follow up.  This format is felt to be most appropriate for this patient at this time.  Due to technical limitations with video connection web designer), today's appointment [name] be conducted as an audio only telehealth visit, and [name] verbally agreed to proceed in this manner.   All issues noted in this document were discussed and addressed.  No physical exam could be performed with this format.  Evaluation Performed:  Preoperative cardiovascular risk assessment _____________   Date:  06/26/2024   Patient ID:  [name], DOB 1951-06-03, MRN [id number] Patient Location:  Home Provider location:   Office  Primary Care Provider:  [name], MD Primary Cardiologist:  Wilbert Bihari, MD  Chief Complaint / Patient Profile   73 y.o. y/o female with a h/o CAD, CHF, CLD, CVA, Depression, HTN, HLD, Type1 DM who is pending left total hip arthroplasty and presents today for telephonic preoperative cardiovascular risk assessment.  History of Present Illness    [name] is a 73 y.o. female who presents via audio/video conferencing for a telehealth visit today.  Pt was last seen in cardiology clinic on 07/12/23 by Rollo Louder, PA-C.  At that time [name] was doing well.  The patient is now pending procedure as outlined above. Since her last visit, she has been doing well from a cardiovascular standpoint.  No shortness of breath or chest pain.  No lower extremity edema or palpitations.  She does meet minimum METS even with her left hip issues and pain.  Her aspirin  may be held for 5 to 7 days prior to her surgery. Please resume as soon as hemostasis is achieved.    Past Medical History    Past Medical History:  Diagnosis Date   Anemia    iron deficiency   Blood transfusion    CAD (coronary artery disease), native  coronary artery    coronary CTA showed a coronary Ca score of 506, mild CAD with 25-49% mRCA and moderate CAD with 50-69% sequential mRCA, 25-49% inferior sub branch of D1 and 25-49% in pLCx.     Cervical spondylosis with myelopathy and radiculopathy    CHF (congestive heart failure) (HCC)    Chronic kidney disease    CKD 3   CVA (cerebral vascular accident) (HCC)    11/2017 - no weakness or paralysis   Depression    Diabetes mellitus    Type 1   Diastolic dysfunction    Diverticulitis    DVT (deep venous thrombosis) (HCC)    times 2 lower leg   Endometriosis    Fibromyalgia    GERD (gastroesophageal reflux disease)    occ   History of kidney stones    Hyperlipidemia    Hypertension    Hypothyroidism    Mild aortic insufficiency    by echo 04/2013   Osteoarthritis    back and knee   Ovarian cyst    PONV (postoperative nausea and vomiting)    RLS (restless legs syndrome)    Thoracic compression fracture (HCC)    Villous adenoma of right colon 04/08/2016   Vitamin D  deficiency disease    Past Surgical History:  Procedure Laterality Date   ANTERIOR CERVICAL DECOMP/DISCECTOMY FUSION N/A 03/23/2017   Procedure: ANTERIOR CERVICAL DECOMPRESSION/DISCECTOMY FUSION, INTERBODY PROSTHESIS,PLATE CERVICAL THREE- CERVICAL FOUR, CERVICAL FOUR- CERVICAL FIVE, CERVICAL FIVE- CERVICAL SIX;  Surgeon: Mavis Purchase, MD;  Location: [facility] OR;  Service: Neurosurgery;  Laterality: N/A;   ANTERIOR CERVICAL DECOMP/DISCECTOMY FUSION N/A 04/18/2019   Procedure: ANTERIOR CERVICAL DECOMPRESSION/DISCECTOMY  CERVICAL SIX- CERVICAL SEVEN;  Surgeon: Mavis Purchase, MD;  Location: [facility] OR;  Service: Neurosurgery;  Laterality: N/A;  anterior   APPENDECTOMY     BACK SURGERY  2005   April  2013 - spinal fusion@ [facility]   bowel blockage surgery     CARDIAC CATHETERIZATION  04/2013   normal coronary arteries and normal LVF   CARPAL TUNNEL RELEASE  06/05/2012   Procedure: CARPAL TUNNEL RELEASE;  Surgeon: Lamar LULLA Leonor Mickey., MD;  Location: [facility];  Service: Orthopedics;  Laterality: Left;   CESAREAN SECTION     x 2   CHOLECYSTECTOMY     COLECTOMY  04/08/2016   DILATION AND CURETTAGE OF UTERUS     DORSAL COMPARTMENT RELEASE  06/05/2012   Procedure: RELEASE DORSAL COMPARTMENT (DEQUERVAIN);  Surgeon: Lamar LULLA Leonor Mickey., MD;  Location: [facility];  Service: Orthopedics;  Laterality: Left;  Excision of mixoid cyst also   EYE SURGERY     cataracts bilateral   INCISIONAL HERNIA REPAIR N/A 05/18/2022   Procedure: REPAIR INCISIONAL HERNIA WITH MESH;  Surgeon: Vanderbilt Ned, MD;  Location: [facility] OR;  Service: General;  Laterality: N/A;   JOINT REPLACEMENT     Bilateral knee   KNEE ARTHROPLASTY  2009   lft partial   KNEE ARTHROPLASTY     rt   LAPAROSCOPIC PARTIAL COLECTOMY N/A 04/08/2016   Procedure: LAPAROSCOPIC ASSISTED ASCENDING COLECTOMY POSSIBLE OPEN COLECTOMY;  Surgeon: Elon Pacini, MD;  Location: [facility] OR;  Service: General;  Laterality: N/A;   orthopedic surgeries     multiple   perforated bowl     Sep 09 2020   REDUCTION MAMMAPLASTY Bilateral    SHOULDER OPEN ROTATOR CUFF REPAIR     rt and lft   TEE WITHOUT CARDIOVERSION N/A 11/23/2017   Procedure: TRANSESOPHAGEAL ECHOCARDIOGRAM (TEE);  Surgeon: Jeffrie Oneil BROCKS, MD;  Location: [facility] ENDOSCOPY;  Service: Cardiovascular;  Laterality: N/A;   TUBAL LIGATION     btsp    Allergies  Allergies  Allergen Reactions   Dilaudid  [Hydromorphone  Hcl] Other (See Comments)    Hallucinations/ argumentative, goes beserk   Heparin  Other (See Comments)    HIT ab positive  (SRA negative 11/25/17)    Ambien  [Zolpidem  Tartrate] Other (See Comments)    Up sleep walking and eating   Belsomra [Suvorexant]     ineffective Other reaction(s): Other ineffective   Codeine Nausea And Vomiting   Morphine  And Codeine Other (See Comments)    Flushing and feeling hot Tolerates with Diphenhydramine    Penicillins Rash and Other (See  Comments)    Caused Headaches also Has patient had a PCN reaction causing immediate rash, facial/tongue/throat swelling, SOB or lightheadedness with hypotension: No Has patient had a PCN reaction causing severe rash involving mucus membranes or skin necrosis: No Has patient had a PCN reaction that required hospitalization No Has patient had a PCN reaction occurring within the last 10 years: No If all of the above answers are NO, then may proceed with Cephalosporin use.     Statins Other (See Comments)    Muscle weakness, cramps and aching all over body   Ceftin [Cefuroxime Axetil] Other (See Comments)    Stomach pain    Elavil [Amitriptyline] Other (See Comments)    Loopy feeling   Lovaza  [Omega-3-Acid  Ethyl  Esters (Fish)] Other (See Comments)    Stomach pain    Lunesta [Eszopiclone] Other (See Comments)    Causes dizziness   Lyrica [Pregabalin] Nausea Only   Metformin  And Related Nausea Only    Home Medications    Prior to Admission medications   Medication Sig Start Date End Date Taking? Authorizing Provider  acetaminophen  (TYLENOL ) 500 MG tablet Take 1,000 mg by mouth every 8 (eight) hours as needed for mild pain or headache.    [provider]  amLODipine  (NORVASC ) 2.5 MG tablet Take 2 tablets (5 mg total) by mouth daily. 05/25/21   Shlomo Wilbert SAUNDERS, MD  aspirin  EC 81 MG tablet Take 81 mg by mouth daily.    [provider]  cetirizine (ZYRTEC) 10 MG tablet Take 10 mg by mouth daily.    [provider]  Cholecalciferol (VITAMIN D3) 125 MCG (5000 UT) TABS Take 5,000 Units by mouth in the morning.    [provider]  Clobetasol  Propionate (TEMOVATE ) 0.05 % external spray Apply 1 spray topically at bedtime as needed (rosacea).    [provider]  doxycycline (VIBRAMYCIN) 50 MG capsule Take 50 mg by mouth daily. 01/26/24   [provider]  escitalopram  (LEXAPRO ) 5 MG tablet Take 5 mg by mouth daily as needed (anxiety). 03/24/22    [provider]  Fluocinolone Acetonide 0.01 % OIL Place 5 drops in ear(s) daily as needed (rosacea). 10/06/23   [provider]  gabapentin  (NEURONTIN ) 300 MG capsule Take 900 mg by mouth at bedtime.    [provider]  HUMALOG  100 UNIT/ML injection Inject 0-110 Units into the skin continuous as needed for high blood sugar (via pump). 02/03/22   [provider]  imipramine  (TOFRANIL ) 50 MG tablet Take 1 tablet (50 mg total) by mouth daily. Patient taking differently: Take 50 mg by mouth at bedtime. 01/16/24   Lomax, Amy, NP  irbesartan -hydrochlorothiazide  (AVALIDE) 300-12.5 MG tablet Take 1 tablet by mouth in the morning.    [provider]  levothyroxine  (SYNTHROID ) 150 MCG tablet Take 150 mcg by mouth daily before breakfast.    [provider]  LORazepam  (ATIVAN ) 0.5 MG tablet Take 0.5-1 mg by mouth every 8 (eight) hours as needed for anxiety. 03/15/19   [provider]  Magnesium  250 MG TABS Take 250 mg by mouth in the morning.    [provider]  methocarbamol  (ROBAXIN ) 750 MG tablet Take 750 mg by mouth every 8 (eight) hours as needed for muscle spasms.    [provider]  metoprolol  succinate (TOPROL -XL) 25 MG 24 hr tablet TAKE 1 TABLET (25 MG TOTAL) BY MOUTH DAILY. 06/27/23   Shlomo Wilbert SAUNDERS, MD  pantoprazole  (PROTONIX ) 40 MG tablet Take 40 mg by mouth daily as needed (acid reflux). 07/03/19   [provider]  Potassium 99 MG TABS Take 99 mg by mouth in the morning.    [provider]  Probiotic Product (PROBIOTIC PO) Take 1 capsule by mouth in the morning.    [provider]  promethazine  (PHENERGAN ) 25 MG tablet Take 25 mg by mouth every 8 (eight) hours as needed for nausea or vomiting.    [provider]  rosuvastatin  (CRESTOR ) 20 MG tablet Take 20 mg by mouth in the morning.    [provider]  temazepam  (RESTORIL ) 15 MG capsule Take 15 mg by mouth at bedtime.  10/10/17    [provider]  tiZANidine  (ZANAFLEX ) 4 MG tablet Take 4 mg  by mouth every 6 (six) hours as needed for muscle spasms. 04/19/22   [provider]  traMADol  (ULTRAM ) 50 MG tablet Take 50 mg by mouth every 8 (eight) hours as needed for moderate pain (pain score 4-6).    [provider]  vitamin B-12 (CYANOCOBALAMIN ) 1000 MCG tablet Take 1,000 mcg by mouth in the morning.    [provider]  zinc  sulfate 220 (50 Zn) MG capsule Take 220 mg by mouth in the morning.    [provider]    Physical Exam    Vital Signs:  [name] does not have vital signs available for review today.  Given telephonic nature of communication, physical exam is limited. AAOx3. NAD. Normal affect.  Speech and respirations are unlabored.  Accessory Clinical Findings    None  Assessment & Plan    1.  Preoperative Cardiovascular Risk Assessment:  [name]'s perioperative risk of a major cardiac event is 11% according to the Revised Cardiac Risk Index (RCRI).  Therefore, she is at high risk for perioperative complications.   Her functional capacity is good at 5.07 METs according to the [facility] Activity Status Index (DASI). Recommendations: According to ACC/AHA guidelines, no further cardiovascular testing needed.  The patient may proceed to surgery at acceptable risk.   Antiplatelet and/or Anticoagulation Recommendations: Aspirin  can be held for 5-7 days prior to her surgery.  Please resume Aspirin  post operatively when it is felt to be safe from a bleeding standpoint.   The patient was advised that if she develops new symptoms prior to surgery to contact our office to arrange for a follow-up visit, and she verbalized understanding.   A copy of this note [name] be routed to requesting surgeon.  Time:   Today, I have spent 7 minutes with the patient with telehealth technology discussing medical history, symptoms, and management plan.     Orren LOISE Fabry,  PA-C  06/26/2024, 11:21 AM

## 2024-08-02 NOTE — Progress Notes (Signed)
 Sent message, via epic in basket, requesting orders in epic from Careers adviser.

## 2024-08-07 ENCOUNTER — Other Ambulatory Visit: Payer: Self-pay | Admitting: Orthopedic Surgery

## 2024-08-07 NOTE — Progress Notes (Signed)
 Second request for pre op orders in [facility]: Left a voicemail for [employer].

## 2024-08-09 NOTE — Progress Notes (Addendum)
 Anesthesia Review:  PCP: [name]  Cardiologist :  Wilbert Shlomo Orren Lucien [name] telephone visit on 06/26/24  Endocrinologist- [name] at [facility]  [name]- 01/24/24- clearance in Media Tab   PPM/ ICD: Device Orders: Rep Notified:  Chest x-ray : 03/21/24  EKG : 11/15/23  and 08/12/24  Echo :06/27/23  Stress test: 2014  Cardiac Cath :  CT Card- 2021   Activity level:  Sleep Study/ CPAP : Fasting Blood Sugar :      / Checks Blood Sugar -- times a day:    DM- type1 Hgba1c-   08/12/24- 7.30 riouted to DR [name] on 08/12/24.  Insulin  Pump - monitor glucose and delivers insulin    Blood Thinner/ Instructions [name]: ASA / Instructions/ Last Dose :    81 mg aspirin     PT to contact [name] on 08/12/24 in regards to insulin  Pump instructoins for surgery.  PT aware to follow those instructons.   PT is very nervous at preop on 08/12/24.  Pulse 104.  EKG done Pulse 100 .  PT forgot to do Insulin  am of preopl  Glucose- 327.  PT asymptomatic.  [name] aware of above.  NO new orders given.  PT discharged from preop .  Diabetic Coordinator notified on 08/12/24.      BMP done 08/12/24 rotued to DR [name] on 08/12/24.

## 2024-08-09 NOTE — Patient Instructions (Signed)
 SURGICAL WAITING ROOM VISITATION  Patients having surgery or a procedure may have no more than 2 support people in the waiting area - these visitors may rotate.    Children ages 12 and under will not be able to visit patients in [facility] under most circumstances.   Visitors with respiratory illnesses are discouraged from visiting and should remain at home.  If the patient needs to stay at the hospital during part of their recovery, the visitor guidelines for inpatient rooms apply. Pre-op nurse will coordinate an appropriate time for 1 support person to accompany patient in pre-op.  This support person may not rotate.    Please refer to the [facility] website for the visitor guidelines for Inpatients (after your surgery is over and you are in a regular room).       Your procedure is scheduled on:  08/19/2024    Report to [facility] Main Entrance    Report to admitting at  0745 AM   Call this number if you have problems the morning of surgery [phone]   Do not eat food :After Midnight.   After Midnight you may have the following liquids until __0715____ AM DAY OF SURGERY  Water Non-Citrus Juices (without pulp, NO RED-Apple, White grape, White cranberry) Black Coffee (NO MILK/CREAM OR CREAMERS, sugar ok)  Clear Tea (NO MILK/CREAM OR CREAMERS, sugar ok) regular and decaf                             Plain Jell-O (NO RED)                                           Fruit ices (not with fruit pulp, NO RED)                                     Popsicles (NO RED)                                                               Sports drinks like Gatorade (NO RED)                    The day of surgery:  Drink ONE (1) Pre-Surgery Clear Ensure or G2 at   0715AM the morning of surgery. Drink in one sitting. Do not sip.  This drink was given to you during your hospital  pre-op appointment visit. Nothing else to drink after completing the  Pre-Surgery Clear Ensure or  G2.          If you have questions, please contact your surgeons office.      Oral Hygiene is also important to reduce your risk of infection.                                    Remember - BRUSH YOUR TEETH THE MORNING OF SURGERY WITH YOUR REGULAR TOOTHPASTE  DENTURES WILL BE REMOVED PRIOR TO SURGERY PLEASE DO NOT APPLY Poly grip  OR ADHESIVES!!!   Do NOT smoke after Midnight   Stop all vitamins and herbal supplements 7 days before surgery.   Take these medicines the morning of surgery with A SIP OF WATER:  amlodipnioe, zyrtec, lexapro  if needed, synthroid , magnesium , protonix  if needed, toprol , potassium    Insulin  Pump instructons per endocrinologist   DO NOT TAKE ANY ORAL DIABETIC MEDICATIONS DAY OF YOUR SURGERY  Bring CPAP mask and tubing day of surgery.                              You may not have any metal on your body including hair pins, jewelry, and body piercing             Do not wear make-up, lotions, powders, perfumes/cologne, or deodorant  Do not wear nail polish including gel and S&S, artificial/acrylic nails, or any other type of covering on natural nails including finger and toenails. If you have artificial nails, gel coating, etc. that needs to be removed by a nail salon please have this removed prior to surgery or surgery may need to be canceled/ delayed if the surgeon/ anesthesia feels like they are unable to be safely monitored.   Do not shave  48 hours prior to surgery.               Men may shave face and neck.   Do not bring valuables to the hospital.  IS NOT             RESPONSIBLE   FOR VALUABLES.   Contacts, glasses, dentures or bridgework may not be worn into surgery.   Bring small overnight bag day of surgery.   DO NOT BRING YOUR HOME MEDICATIONS TO THE HOSPITAL. PHARMACY WILL DISPENSE MEDICATIONS LISTED ON YOUR MEDICATION LIST TO YOU DURING YOUR ADMISSION IN THE HOSPITAL!    Patients discharged on the day of surgery will not be  allowed to drive home.  Someone NEEDS to stay with you for the first 24 hours after anesthesia.   Special Instructions: Bring a copy of your healthcare power of attorney and living will documents the day of surgery if you haven't scanned them before.              Please read over the following fact sheets you were given: IF YOU HAVE QUESTIONS ABOUT YOUR PRE-OP INSTRUCTIONS PLEASE CALL [phone].   If you received a COVID test during your pre-op visit  it is requested that you wear a mask when out in public, stay away from anyone that may not be feeling well and notify your surgeon if you develop symptoms. If you test positive for Covid or have been in contact with anyone that has tested positive in the last 10 days please notify you surgeon.      Pre-operative 4 CHG Bath Instructions   You can play a key role in reducing the risk of infection after surgery. Your skin needs to be as free of germs as possible. You can reduce the number of germs on your skin by washing with CHG (chlorhexidine  gluconate) soap before surgery. CHG is an antiseptic soap that kills germs and continues to kill germs even after washing.   DO NOT use if you have an allergy to chlorhexidine /CHG or antibacterial soaps. If your skin becomes reddened or irritated, stop using the CHG and notify one of our RNs at [phone].   Please shower with the CHG  soap starting 4 days before surgery using the following schedule:     Please keep in mind the following:  DO NOT shave, including legs and underarms, starting the day of your first shower.   You may shave your face at any point before/day of surgery.  Place clean sheets on your bed the day you start using CHG soap. Use a clean washcloth (not used since being washed) for each shower. DO NOT sleep with pets once you start using the CHG.   CHG Shower Instructions:  If you choose to wash your hair and private area, wash first with your normal shampoo/soap.  After you use  shampoo/soap, rinse your hair and body thoroughly to remove shampoo/soap residue.  Turn the water OFF and apply about 3 tablespoons (45 ml) of CHG soap to a CLEAN washcloth.  Apply CHG soap ONLY FROM YOUR NECK DOWN TO YOUR TOES (washing for 3-5 minutes)  DO NOT use CHG soap on face, private areas, open wounds, or sores.  Pay special attention to the area where your surgery is being performed.  If you are having back surgery, having someone wash your back for you may be helpful. Wait 2 minutes after CHG soap is applied, then you may rinse off the CHG soap.  Pat dry with a clean towel  Put on clean clothes/pajamas   If you choose to wear lotion, please use ONLY the CHG-compatible lotions on the back of this paper.     Additional instructions for the day of surgery: DO NOT APPLY any lotions, deodorants, cologne, or perfumes.   Put on clean/comfortable clothes.  Brush your teeth.  Ask your nurse before applying any prescription medications to the skin.      CHG Compatible Lotions   Aveeno Moisturizing lotion  Cetaphil Moisturizing Cream  Cetaphil Moisturizing Lotion  Clairol Herbal Essence Moisturizing Lotion, Dry Skin  Clairol Herbal Essence Moisturizing Lotion, Extra Dry Skin  Clairol Herbal Essence Moisturizing Lotion, Normal Skin  Curel Age Defying Therapeutic Moisturizing Lotion with Alpha Hydroxy  Curel Extreme Care Body Lotion  Curel Soothing Hands Moisturizing Hand Lotion  Curel Therapeutic Moisturizing Cream, Fragrance-Free  Curel Therapeutic Moisturizing Lotion, Fragrance-Free  Curel Therapeutic Moisturizing Lotion, Original Formula  Eucerin Daily Replenishing Lotion  Eucerin Dry Skin Therapy Plus Alpha Hydroxy Crme  Eucerin Dry Skin Therapy Plus Alpha Hydroxy Lotion  Eucerin Original Crme  Eucerin Original Lotion  Eucerin Plus Crme Eucerin Plus Lotion  Eucerin TriLipid Replenishing Lotion  Keri Anti-Bacterial Hand Lotion  Keri Deep Conditioning Original Lotion Dry  Skin Formula Softly Scented  Keri Deep Conditioning Original Lotion, Fragrance Free Sensitive Skin Formula  Keri Lotion Fast Absorbing Fragrance Free Sensitive Skin Formula  Keri Lotion Fast Absorbing Softly Scented Dry Skin Formula  Keri Original Lotion  Keri Skin Renewal Lotion Keri Silky Smooth Lotion  Keri Silky Smooth Sensitive Skin Lotion  Nivea Body Creamy Conditioning Oil  Nivea Body Extra Enriched Teacher, Adult Education Moisturizing Lotion Nivea Crme  Nivea Skin Firming Lotion  NutraDerm 30 Skin Lotion  NutraDerm Skin Lotion  NutraDerm Therapeutic Skin Cream  NutraDerm Therapeutic Skin Lotion  ProShield Protective Hand Cream  Provon moisturizing lotion

## 2024-08-12 ENCOUNTER — Encounter (HOSPITAL_COMMUNITY): Payer: Self-pay

## 2024-08-12 ENCOUNTER — Encounter (HOSPITAL_COMMUNITY)
Admission: RE | Admit: 2024-08-12 | Discharge: 2024-08-12 | Disposition: A | Discharge status: Home / Self Care | Source: Ambulatory Visit | Attending: Orthopedic Surgery | Admitting: Orthopedic Surgery

## 2024-08-12 ENCOUNTER — Other Ambulatory Visit: Payer: Self-pay

## 2024-08-12 VITALS — BP 114/64 | HR 104 | Temp 98.7°F | Resp 16 | Ht 62.0 in | Wt 193.0 lb

## 2024-08-12 DIAGNOSIS — Z01818 Encounter for other preprocedural examination: Secondary | ICD-10-CM | POA: Diagnosis not present

## 2024-08-12 DIAGNOSIS — Z6835 Body mass index (BMI) 35.0-35.9, adult: Secondary | ICD-10-CM | POA: Insufficient documentation

## 2024-08-12 DIAGNOSIS — M797 Fibromyalgia: Secondary | ICD-10-CM | POA: Insufficient documentation

## 2024-08-12 DIAGNOSIS — F32A Depression, unspecified: Secondary | ICD-10-CM | POA: Insufficient documentation

## 2024-08-12 DIAGNOSIS — M4316 Spondylolisthesis, lumbar region: Secondary | ICD-10-CM

## 2024-08-12 DIAGNOSIS — Z794 Long term (current) use of insulin: Secondary | ICD-10-CM | POA: Diagnosis not present

## 2024-08-12 DIAGNOSIS — Z8673 Personal history of transient ischemic attack (TIA), and cerebral infarction without residual deficits: Secondary | ICD-10-CM | POA: Insufficient documentation

## 2024-08-12 DIAGNOSIS — I5032 Chronic diastolic (congestive) heart failure: Secondary | ICD-10-CM | POA: Diagnosis not present

## 2024-08-12 DIAGNOSIS — E1122 Type 2 diabetes mellitus with diabetic chronic kidney disease: Secondary | ICD-10-CM | POA: Insufficient documentation

## 2024-08-12 DIAGNOSIS — Z79890 Hormone replacement therapy (postmenopausal): Secondary | ICD-10-CM | POA: Diagnosis not present

## 2024-08-12 DIAGNOSIS — Z86718 Personal history of other venous thrombosis and embolism: Secondary | ICD-10-CM | POA: Insufficient documentation

## 2024-08-12 DIAGNOSIS — I2510 Atherosclerotic heart disease of native coronary artery without angina pectoris: Secondary | ICD-10-CM | POA: Insufficient documentation

## 2024-08-12 DIAGNOSIS — N183 Chronic kidney disease, stage 3 unspecified: Secondary | ICD-10-CM | POA: Insufficient documentation

## 2024-08-12 DIAGNOSIS — Z01810 Encounter for preprocedural cardiovascular examination: Secondary | ICD-10-CM | POA: Diagnosis present

## 2024-08-12 DIAGNOSIS — E669 Obesity, unspecified: Secondary | ICD-10-CM | POA: Insufficient documentation

## 2024-08-12 DIAGNOSIS — I130 Hypertensive heart and chronic kidney disease with heart failure and stage 1 through stage 4 chronic kidney disease, or unspecified chronic kidney disease: Secondary | ICD-10-CM | POA: Diagnosis not present

## 2024-08-12 DIAGNOSIS — M1612 Unilateral primary osteoarthritis, left hip: Secondary | ICD-10-CM | POA: Insufficient documentation

## 2024-08-12 DIAGNOSIS — Z01812 Encounter for preprocedural laboratory examination: Secondary | ICD-10-CM | POA: Diagnosis present

## 2024-08-12 DIAGNOSIS — G2581 Restless legs syndrome: Secondary | ICD-10-CM | POA: Insufficient documentation

## 2024-08-12 DIAGNOSIS — Z981 Arthrodesis status: Secondary | ICD-10-CM | POA: Insufficient documentation

## 2024-08-12 DIAGNOSIS — E039 Hypothyroidism, unspecified: Secondary | ICD-10-CM | POA: Insufficient documentation

## 2024-08-12 HISTORY — DX: Headache, unspecified: R51.9

## 2024-08-12 HISTORY — DX: Anxiety disorder, unspecified: F41.9

## 2024-08-12 LAB — SURGICAL PCR SCREEN
MRSA, PCR: NEGATIVE
Staphylococcus aureus: NEGATIVE

## 2024-08-12 LAB — CBC
HCT: 42.8 % (ref 36.0–46.0)
Hemoglobin: 13.8 g/dL (ref 12.0–15.0)
MCH: 26.8 pg (ref 26.0–34.0)
MCHC: 32.2 g/dL (ref 30.0–36.0)
MCV: 83.3 fL (ref 80.0–100.0)
Platelets: 267 K/uL (ref 150–400)
RBC: 5.14 MIL/uL — ABNORMAL HIGH (ref 3.87–5.11)
RDW: 13.6 % (ref 11.5–15.5)
WBC: 6.7 K/uL (ref 4.0–10.5)
nRBC: 0 % (ref 0.0–0.2)

## 2024-08-12 LAB — BASIC METABOLIC PANEL WITH GFR
Anion gap: 11 (ref 5–15)
BUN: 17 mg/dL (ref 8–23)
CO2: 26 mmol/L (ref 22–32)
Calcium: 9.8 mg/dL (ref 8.9–10.3)
Chloride: 100 mmol/L (ref 98–111)
Creatinine, Ser: 1.22 mg/dL — ABNORMAL HIGH (ref 0.44–1.00)
GFR, Estimated: 46 mL/min — ABNORMAL LOW
Glucose, Bld: 297 mg/dL — ABNORMAL HIGH (ref 70–99)
Potassium: 4.2 mmol/L (ref 3.5–5.1)
Sodium: 137 mmol/L (ref 135–145)

## 2024-08-12 LAB — HEMOGLOBIN A1C
Hgb A1c MFr Bld: 7.3 % — ABNORMAL HIGH (ref 4.8–5.6)
Mean Plasma Glucose: 162.81 mg/dL

## 2024-08-12 LAB — GLUCOSE, CAPILLARY: Glucose-Capillary: 327 mg/dL — ABNORMAL HIGH (ref 70–99)

## 2024-08-13 NOTE — Progress Notes (Signed)
 " Case: [id number] Date/Time: 08/19/24 1012   Procedure: ARTHROPLASTY, HIP, TOTAL, ANTERIOR APPROACH (Left: Hip)   Anesthesia type: Spinal   Pre-op diagnosis: left hip osteoarthritis   Location: [facility] ROOM 08 / [facility]   Surgeons: Yvone Rush, MD       DISCUSSION: [name] is a 74 yo female with PMH of HTN, moderate nonobstructive CAD (by CTA), HFpEF, hx of DVT, hx of CVA (11/2017), migraines, RLS, IDDM, CKD3, hypothyroid, fibromyalgia, depression, arthritis, obesity (BMI 35), multiple back/neck surgeries: back surgeries (L4-5/L5-S1 PLIF 09/01/03; L3-4 PLIF 11/15/11; L2-3 PLIF 11/15/17), neck surgeries (C3-6 ACDF 03/23/17; C6-7 ACDF 04/18/19)  Patient previously scheduled for surgery in August 2025 but canceled due to high A1c.  Hx of CAD which is managed medically. Last seen by Cardiology on 07/12/23 and noted to be doing well. She has had w/u for syncope which has been unrevealing. Of note patient has episodes of near-syncope with blood draws. She had cardiac clearance visit on 06/26/24 with Orren Fabry PA-C. Cleared for surgery:  Preoperative Cardiovascular Risk Assessment:   Ms. Lange's perioperative risk of a major cardiac event is 11% according to the Revised Cardiac Risk Index (RCRI).  Therefore, she is at high risk for perioperative complications.   Her functional capacity is good at 5.07 METs according to the Duke Activity Status Index (DASI). Recommendations: According to ACC/AHA guidelines, no further cardiovascular testing needed.  The patient may proceed to surgery at acceptable risk.   Antiplatelet and/or Anticoagulation Recommendations: Aspirin  can be held for 5-7 days prior to her surgery.  Please resume Aspirin  post operatively when it is felt to be safe from a bleeding standpoint.   Pt follows with Neurology for migraines and RLS. Also with hx of CVA, no residual deficits.   Seen by PCP on 06/19/24 for pre op clearance. Per Dr. Teresa: Patient is cleared medically for surgery  today. She has already had cardiac clearance per cardiologist in 02/2024, and A1C per [name], NP last week was at goal range requested per Dr. Yvone. Discussed ways to aid in managing glucose when elevated from stress to further ensure surgery is not postponed further. Cardiologist will give permission to stop aspirin  5 days prior to surgery.  Hx of IDDM and followed by Endocrine. A1c is 7.3 in pre op although point of care glucose was 327 and patient mildly tachycardic. Patient reported she had not taken her insulin  this AM. She was advised to drink water and use her insulin  as directed.  VS: BP 114/64   Pulse (!) 104   Temp 37.1 C (Oral)   Resp 16   Ht 5' 2 (1.575 m)   Wt 87.5 kg   SpO2 98%   BMI 35.30 kg/m   PROVIDERS: [name], MD   LABS: Labs reviewed: Acceptable for surgery. (all labs ordered are listed, but only abnormal results are displayed)  Labs Reviewed  CBC - Abnormal; Notable for the following components:      Result Value   RBC 5.14 (*)    All other components within normal limits  BASIC METABOLIC PANEL WITH GFR - Abnormal; Notable for the following components:   Glucose, Bld 297 (*)    Creatinine, Ser 1.22 (*)    GFR, Estimated 46 (*)    All other components within normal limits  HEMOGLOBIN A1C - Abnormal; Notable for the following components:   Hgb A1c MFr Bld 7.3 (*)    All other components within normal limits  GLUCOSE, CAPILLARY - Abnormal;  Notable for the following components:   Glucose-Capillary 327 (*)    All other components within normal limits  SURGICAL PCR SCREEN  TYPE AND SCREEN    EKG 08/12/24:  NSR Incomplete RBBB LAFB Possible lateral infarct, age undetermined   Echo 06/27/2023:  IMPRESSIONS    1. Left ventricular ejection fraction, by estimation, is 65 to 70%. The left ventricle has normal function. The left ventricle has no regional wall motion abnormalities. There is mild concentric left  ventricular hypertrophy. Left ventricular diastolic parameters are consistent with Grade I diastolic dysfunction (impaired relaxation).  2. Right ventricular systolic function is normal. The right ventricular size is normal. There is normal pulmonary artery systolic pressure. The estimated right ventricular systolic pressure is 21.5 mmHg.  3. Left atrial size was moderately dilated.  4. Right atrial size was mildly dilated.  5. The mitral valve is degenerative. No evidence of mitral valve regurgitation. No evidence of mitral stenosis. Moderate mitral annular calcification.  6. The aortic valve is tricuspid. There is mild calcification of the aortic valve. There is mild thickening of the aortic valve. Aortic valve regurgitation is trivial. Aortic valve sclerosis/calcification is present, without any evidence of aortic stenosis.  CCTA 01/13/2020:  MPRESSION: 1. Coronary calcium  score of 506. This was 94th percentile for age and sex matched control.   2.  Normal coronary origin with right dominance.   3.  Moderate atherosclerosis.  CAD-RADS=3.   4. Consider symptom-guided anti-ischemic and preventive pharmacotherapy as well as risk factor modification per guideline-directed care.   5.  Consider functional ischemia assessment.   6.  This study has been submitted for FFR analysis.  FFR:  IMPRESSION: 1. CT FFR flow analysis demonstrates no hemodynamically significant flow limiting lesions.    Past Medical History:  Diagnosis Date   Anxiety    Blood transfusion    CAD (coronary artery disease), native coronary artery    coronary CTA showed a coronary Ca score of 506, mild CAD with 25-49% mRCA and moderate CAD with 50-69% sequential mRCA, 25-49% inferior sub branch of D1 and 25-49% in pLCx.     Cervical spondylosis with myelopathy and radiculopathy    CVA (cerebral vascular accident) (HCC)    11/2017 - no weakness or paralysis   Depression    Diabetes mellitus    Type 1    Diastolic dysfunction    Diverticulitis    DVT (deep venous thrombosis) (HCC)    times 2 lower leg   Endometriosis    Fibromyalgia    Headache    History of kidney stones    Hyperlipidemia    Hypertension    Hypothyroidism    Mild aortic insufficiency    by echo 04/2013   Osteoarthritis    back and knee   Ovarian cyst    PONV (postoperative nausea and vomiting)    RLS (restless legs syndrome)    Thoracic compression fracture (HCC)    Villous adenoma of right colon 04/08/2016   Vitamin D  deficiency disease     Past Surgical History:  Procedure Laterality Date   ANTERIOR CERVICAL DECOMP/DISCECTOMY FUSION N/A 03/23/2017   Procedure: ANTERIOR CERVICAL DECOMPRESSION/DISCECTOMY FUSION, INTERBODY PROSTHESIS,PLATE CERVICAL THREE- CERVICAL FOUR, CERVICAL FOUR- CERVICAL FIVE, CERVICAL FIVE- CERVICAL SIX;  Surgeon: Mavis Purchase, MD;  Location: [facility] OR;  Service: Neurosurgery;  Laterality: N/A;   ANTERIOR CERVICAL DECOMP/DISCECTOMY FUSION N/A 04/18/2019   Procedure: ANTERIOR CERVICAL DECOMPRESSION/DISCECTOMY  CERVICAL SIX- CERVICAL SEVEN;  Surgeon: Mavis Purchase, MD;  Location: [facility] OR;  Service:  Neurosurgery;  Laterality: N/A;  anterior   APPENDECTOMY     BACK SURGERY  2005   April  2013 - spinal fusion@ [facility]   bowel blockage surgery     CARDIAC CATHETERIZATION  04/2013   normal coronary arteries and normal LVF   CARPAL TUNNEL RELEASE  06/05/2012   Procedure: CARPAL TUNNEL RELEASE;  Surgeon: Lamar LULLA Leonor Mickey., MD;  Location: [facility];  Service: Orthopedics;  Laterality: Left;   CESAREAN SECTION     x 2   CHOLECYSTECTOMY     COLECTOMY  04/08/2016   DILATION AND CURETTAGE OF UTERUS     DORSAL COMPARTMENT RELEASE  06/05/2012   Procedure: RELEASE DORSAL COMPARTMENT (DEQUERVAIN);  Surgeon: Lamar LULLA Leonor Mickey., MD;  Location: [facility];  Service: Orthopedics;  Laterality: Left;  Excision of mixoid cyst also   EYE SURGERY     cataracts bilateral    INCISIONAL HERNIA REPAIR N/A 05/18/2022   Procedure: REPAIR INCISIONAL HERNIA WITH MESH;  Surgeon: Vanderbilt Ned, MD;  Location: [facility] OR;  Service: General;  Laterality: N/A;   JOINT REPLACEMENT     Bilateral knee   KNEE ARTHROPLASTY  2009   lft partial   KNEE ARTHROPLASTY     rt   LAPAROSCOPIC PARTIAL COLECTOMY N/A 04/08/2016   Procedure: LAPAROSCOPIC ASSISTED ASCENDING COLECTOMY POSSIBLE OPEN COLECTOMY;  Surgeon: Elon Pacini, MD;  Location: [facility] OR;  Service: General;  Laterality: N/A;   orthopedic surgeries     multiple   perforated bowl     Sep 09 2020   REDUCTION MAMMAPLASTY Bilateral    SHOULDER OPEN ROTATOR CUFF REPAIR     rt and lft   TEE WITHOUT CARDIOVERSION N/A 11/23/2017   Procedure: TRANSESOPHAGEAL ECHOCARDIOGRAM (TEE);  Surgeon: Jeffrie Oneil BROCKS, MD;  Location: [facility] ENDOSCOPY;  Service: Cardiovascular;  Laterality: N/A;   TUBAL LIGATION     btsp    MEDICATIONS:  acetaminophen  (TYLENOL ) 500 MG tablet   amLODipine  (NORVASC ) 2.5 MG tablet   aspirin  EC 81 MG tablet   cetirizine (ZYRTEC) 10 MG tablet   Cholecalciferol (VITAMIN D3) 125 MCG (5000 UT) TABS   Clobetasol  Propionate (TEMOVATE ) 0.05 % external spray   doxycycline (VIBRAMYCIN) 50 MG capsule   escitalopram  (LEXAPRO ) 5 MG tablet   Fluocinolone Acetonide 0.01 % OIL   gabapentin  (NEURONTIN ) 300 MG capsule   HUMALOG  100 UNIT/ML injection   imipramine  (TOFRANIL ) 50 MG tablet   irbesartan -hydrochlorothiazide  (AVALIDE) 300-12.5 MG tablet   levothyroxine  (SYNTHROID ) 150 MCG tablet   LORazepam  (ATIVAN ) 0.5 MG tablet   Magnesium  250 MG TABS   methocarbamol  (ROBAXIN ) 750 MG tablet   metoprolol  succinate (TOPROL -XL) 25 MG 24 hr tablet   omeprazole (PRILOSEC) 40 MG capsule   Potassium 99 MG TABS   Probiotic Product (PROBIOTIC PO)   promethazine  (PHENERGAN ) 25 MG tablet   rosuvastatin  (CRESTOR ) 20 MG tablet   tiZANidine  (ZANAFLEX ) 4 MG tablet   traMADol  (ULTRAM ) 50 MG tablet   vitamin B-12 (CYANOCOBALAMIN ) 1000 MCG  tablet   zinc  sulfate 220 (50 Zn) MG capsule   No current facility-administered medications for this encounter.   Burnard CHRISTELLA Odis DEVONNA [facility] Short Stay/Anesthesiology [facility] Phone [phone] 08/13/2024 11:36 AM        "

## 2024-08-13 NOTE — Anesthesia Preprocedure Evaluation (Addendum)
"                                    Anesthesia Evaluation    Reviewed: Allergy & Precautions, Patient's Chart, lab work & pertinent test results  History of Anesthesia Complications (+) PONV and history of anesthetic complications  Airway Mallampati: II       Dental  (+) Teeth Intact, Dental Advisory Given   Pulmonary    breath sounds clear to auscultation       Cardiovascular hypertension, Pt. on medications and Pt. on home beta blockers + CAD   Rhythm:Regular Rate:Normal  Echo:  1. Left ventricular ejection fraction, by estimation, is 65 to 70%. The  left ventricle has normal function. The left ventricle has no regional  wall motion abnormalities. There is mild concentric left ventricular  hypertrophy. Left ventricular diastolic  parameters are consistent with Grade I diastolic dysfunction (impaired  relaxation).   2. Right ventricular systolic function is normal. The right ventricular  size is normal. There is normal pulmonary artery systolic pressure. The  estimated right ventricular systolic pressure is 21.5 mmHg.   3. Left atrial size was moderately dilated.   4. Right atrial size was mildly dilated.   5. The mitral valve is degenerative. No evidence of mitral valve  regurgitation. No evidence of mitral stenosis. Moderate mitral annular  calcification.   6. The aortic valve is tricuspid. There is mild calcification of the  aortic valve. There is mild thickening of the aortic valve. Aortic valve  regurgitation is trivial. Aortic valve sclerosis/calcification is present,  without any evidence of aortic  stenosis.      Neuro/Psych  Headaches PSYCHIATRIC DISORDERS Anxiety Depression     Neuromuscular disease CVA    GI/Hepatic Neg liver ROS,GERD  ,,  Endo/Other  diabetesHypothyroidism    Renal/GU Renal disease     Musculoskeletal  (+) Arthritis ,  Fibromyalgia -  Abdominal   Peds  Hematology  (+) Blood dyscrasia, anemia   Anesthesia Other  Findings   Reproductive/Obstetrics                              Anesthesia Physical Anesthesia Plan  ASA: 3  Anesthesia Plan: General   Post-op Pain Management: Ofirmev  IV (intra-op)*   Induction: Intravenous  PONV Risk Score and Plan: 4 or greater and Ondansetron , Dexamethasone , TIVA, Propofol  infusion, Midazolam  and Scopolamine  patch - Pre-op  Airway Management Planned: Oral ETT  Additional Equipment: None  Intra-op Plan:   Post-operative Plan: Extubation in OR  Informed Consent: I have reviewed the patients History and Physical, chart, labs and discussed the procedure including the risks, benefits and alternatives for the proposed anesthesia with the patient or authorized representative who has indicated his/her understanding and acceptance.     Dental advisory given  Plan Discussed with: CRNA  Anesthesia Plan Comments: (See PAT note from 1/5)         Anesthesia Quick Evaluation  "

## 2024-08-14 ENCOUNTER — Ambulatory Visit
Admission: RE | Admit: 2024-08-14 | Discharge: 2024-08-14 | Disposition: A | Discharge status: Home / Self Care | Source: Ambulatory Visit | Attending: Student | Admitting: Student

## 2024-08-14 ENCOUNTER — Other Ambulatory Visit: Payer: Self-pay | Admitting: Cardiology

## 2024-08-14 DIAGNOSIS — M4316 Spondylolisthesis, lumbar region: Secondary | ICD-10-CM

## 2024-08-16 DIAGNOSIS — M1612 Unilateral primary osteoarthritis, left hip: Principal | ICD-10-CM | POA: Diagnosis present

## 2024-08-16 NOTE — H&P (Signed)
 TOTAL HIP ADMISSION H&P  Patient is admitted for left total hip arthroplasty.  Subjective:  Chief Complaint: left hip pain  HPI: [name], 74 y.o. female, has a history of pain and functional disability in the left hip(s) due to arthritis and patient has failed non-surgical conservative treatments for greater than 12 weeks to include NSAID's and/or analgesics, flexibility and strengthening excercises, use of assistive devices, weight reduction as appropriate, and activity modification.  Onset of symptoms was gradual starting several years ago with gradually worsening course since that time.The patient noted no past surgery on the left hip(s).  Patient currently rates pain in the left hip at 10 out of 10 with activity. Patient has night pain, worsening of pain with activity and weight bearing, trendelenberg gait, pain that interfers with activities of daily living, and pain with passive range of motion. Patient has evidence of joint space narrowing by imaging studies. This condition presents safety issues increasing the risk of falls.  There is no current active infection.  Patient Active Problem List   Diagnosis Date Noted   Arthritis of left hip 08/16/2024   Postoperative infection 06/03/2022   Colitis, acute 06/02/2022   Chronic kidney disease, stage 3a (HCC) 06/02/2022   Mood disorder 06/02/2022   Incisional hernia without obstruction or gangrene 05/18/2022   CAD (coronary artery disease), native coronary artery    Diabetic hyperosmolar non-ketotic state (HCC) 05/15/2018   Hyperlipidemia 05/15/2018   GERD (gastroesophageal reflux disease) 05/15/2018   Depression with anxiety 05/15/2018   Cerebral thrombosis with cerebral infarction 11/22/2017   Acute encephalopathy    DKA (diabetic ketoacidosis) (HCC) 11/18/2017   Diabetic ketoacidosis (HCC) 11/18/2017   Acute respiratory failure with hypoxia (HCC)    Acute renal failure    Endotracheal tube present    Septic shock (HCC)     Spondylolisthesis of lumbar region 11/15/2017   PAC (premature atrial contraction) 09/04/2017   Cervical spondylosis with radiculopathy 03/23/2017   Hypoxia    Villous adenoma of right colon 04/08/2016   Leukocytosis 04/08/2016   Anemia    Diastolic dysfunction    Mild aortic insufficiency    Other nonspecific abnormal cardiovascular system function study 05/03/2013   SOB (shortness of breath) 05/01/2013   Atypical chest pain 02/28/2013   Dysphagia 02/28/2013   Lower urinary tract infectious disease 07/06/2012   Intractable nausea and vomiting 07/06/2012   Blood loss anemia 07/06/2012   Epigastric abdominal pain 06/27/2012   SIRS (systemic inflammatory response syndrome) (HCC) 07/28/2011   Type II diabetes mellitus (HCC) 07/28/2011   Hypothyroidism 07/28/2011   Hypertension 07/28/2011   Hepatic steatosis 07/28/2011   Past Medical History:  Diagnosis Date   Anxiety    Blood transfusion    CAD (coronary artery disease), native coronary artery    coronary CTA showed a coronary Ca score of 506, mild CAD with 25-49% mRCA and moderate CAD with 50-69% sequential mRCA, 25-49% inferior sub branch of D1 and 25-49% in pLCx.     Cervical spondylosis with myelopathy and radiculopathy    CVA (cerebral vascular accident) (HCC)    11/2017 - no weakness or paralysis   Depression    Diabetes mellitus    Type 1   Diastolic dysfunction    Diverticulitis    DVT (deep venous thrombosis) (HCC)    times 2 lower leg   Endometriosis    Fibromyalgia    Headache    History of kidney stones    Hyperlipidemia    Hypertension  Hypothyroidism    Mild aortic insufficiency    by echo 04/2013   Osteoarthritis    back and knee   Ovarian cyst    PONV (postoperative nausea and vomiting)    RLS (restless legs syndrome)    Thoracic compression fracture (HCC)    Villous adenoma of right colon 04/08/2016   Vitamin D  deficiency disease     Past Surgical History:  Procedure Laterality Date   ANTERIOR  CERVICAL DECOMP/DISCECTOMY FUSION N/A 03/23/2017   Procedure: ANTERIOR CERVICAL DECOMPRESSION/DISCECTOMY FUSION, INTERBODY PROSTHESIS,PLATE CERVICAL THREE- CERVICAL FOUR, CERVICAL FOUR- CERVICAL FIVE, CERVICAL FIVE- CERVICAL SIX;  Surgeon: Mavis Purchase, MD;  Location: [facility] OR;  Service: Neurosurgery;  Laterality: N/A;   ANTERIOR CERVICAL DECOMP/DISCECTOMY FUSION N/A 04/18/2019   Procedure: ANTERIOR CERVICAL DECOMPRESSION/DISCECTOMY  CERVICAL SIX- CERVICAL SEVEN;  Surgeon: Mavis Purchase, MD;  Location: [facility] OR;  Service: Neurosurgery;  Laterality: N/A;  anterior   APPENDECTOMY     BACK SURGERY  2005   April  2013 - spinal fusion@ [facility]   bowel blockage surgery     CARDIAC CATHETERIZATION  04/2013   normal coronary arteries and normal LVF   CARPAL TUNNEL RELEASE  06/05/2012   Procedure: CARPAL TUNNEL RELEASE;  Surgeon: Lamar LULLA Leonor Mickey., MD;  Location:  SURGERY CENTER;  Service: Orthopedics;  Laterality: Left;   CESAREAN SECTION     x 2   CHOLECYSTECTOMY     COLECTOMY  04/08/2016   DILATION AND CURETTAGE OF UTERUS     DORSAL COMPARTMENT RELEASE  06/05/2012   Procedure: RELEASE DORSAL COMPARTMENT (DEQUERVAIN);  Surgeon: Lamar LULLA Leonor Mickey., MD;  Location: [facility];  Service: Orthopedics;  Laterality: Left;  Excision of mixoid cyst also   EYE SURGERY     cataracts bilateral   INCISIONAL HERNIA REPAIR N/A 05/18/2022   Procedure: REPAIR INCISIONAL HERNIA WITH MESH;  Surgeon: Vanderbilt Ned, MD;  Location: [facility] OR;  Service: General;  Laterality: N/A;   JOINT REPLACEMENT     Bilateral knee   KNEE ARTHROPLASTY  2009   lft partial   KNEE ARTHROPLASTY     rt   LAPAROSCOPIC PARTIAL COLECTOMY N/A 04/08/2016   Procedure: LAPAROSCOPIC ASSISTED ASCENDING COLECTOMY POSSIBLE OPEN COLECTOMY;  Surgeon: Elon Pacini, MD;  Location: [facility] OR;  Service: General;  Laterality: N/A;   orthopedic surgeries     multiple   perforated bowl     Sep 09 2020   REDUCTION MAMMAPLASTY  Bilateral    SHOULDER OPEN ROTATOR CUFF REPAIR     rt and lft   TEE WITHOUT CARDIOVERSION N/A 11/23/2017   Procedure: TRANSESOPHAGEAL ECHOCARDIOGRAM (TEE);  Surgeon: Jeffrie Oneil BROCKS, MD;  Location: [facility] ENDOSCOPY;  Service: Cardiovascular;  Laterality: N/A;   TUBAL LIGATION     btsp    No current facility-administered medications for this encounter.   Current Outpatient Medications  Medication Sig Dispense Refill Last Dose/Taking   acetaminophen  (TYLENOL ) 500 MG tablet Take 1,000 mg by mouth every 8 (eight) hours as needed for mild pain or headache.   Taking As Needed   amLODipine  (NORVASC ) 2.5 MG tablet Take 2 tablets (5 mg total) by mouth daily. (Patient taking differently: Take 2.5 mg by mouth daily.) 90 tablet 3 Taking Differently   aspirin  EC 81 MG tablet Take 81 mg by mouth daily.   Taking   cetirizine (ZYRTEC) 10 MG tablet Take 10 mg by mouth daily.   Taking   Cholecalciferol (VITAMIN D3) 125 MCG (5000 UT) TABS Take 5,000  Units by mouth in the morning.   Taking   Clobetasol  Propionate (TEMOVATE ) 0.05 % external spray Apply 1 spray topically at bedtime as needed (rosacea).   Taking As Needed   doxycycline  (VIBRAMYCIN ) 50 MG capsule Take 50 mg by mouth daily.   Taking   escitalopram  (LEXAPRO ) 5 MG tablet Take 5 mg by mouth daily as needed (anxiety).   Taking As Needed   Fluocinolone Acetonide 0.01 % OIL Place 5 drops in ear(s) daily as needed (rosacea).   Taking As Needed   gabapentin  (NEURONTIN ) 300 MG capsule Take 900 mg by mouth at bedtime.   Taking   HUMALOG  100 UNIT/ML injection Inject 0-110 Units into the skin continuous as needed for high blood sugar (via pump).   Taking As Needed   imipramine  (TOFRANIL ) 50 MG tablet Take 1 tablet (50 mg total) by mouth daily. (Patient taking differently: Take 50 mg by mouth at bedtime.) 90 tablet 3 Taking Differently   irbesartan -hydrochlorothiazide  (AVALIDE) 300-12.5 MG tablet Take 1 tablet by mouth in the morning.   Taking   levothyroxine   (SYNTHROID ) 150 MCG tablet Take 150 mcg by mouth daily before breakfast.   Taking   LORazepam  (ATIVAN ) 0.5 MG tablet Take 0.5-1 mg by mouth every 8 (eight) hours as needed for anxiety.   Taking As Needed   Magnesium  250 MG TABS Take 250 mg by mouth in the morning.   Taking   methocarbamol  (ROBAXIN ) 750 MG tablet Take 750 mg by mouth every 8 (eight) hours as needed for muscle spasms.   Taking As Needed   omeprazole (PRILOSEC) 40 MG capsule Take 40 mg by mouth daily.   Taking   Potassium 99 MG TABS Take 99 mg by mouth in the morning.   Taking   Probiotic Product (PROBIOTIC PO) Take 1 capsule by mouth in the morning.   Taking   promethazine  (PHENERGAN ) 25 MG tablet Take 25 mg by mouth every 8 (eight) hours as needed for nausea or vomiting.   Taking As Needed   rosuvastatin  (CRESTOR ) 20 MG tablet Take 20 mg by mouth in the morning.   Taking   traMADol  (ULTRAM ) 50 MG tablet Take 50 mg by mouth every 8 (eight) hours as needed.   Taking As Needed   vitamin B-12 (CYANOCOBALAMIN ) 1000 MCG tablet Take 1,000 mcg by mouth in the morning.   Taking   zinc  sulfate 220 (50 Zn) MG capsule Take 220 mg by mouth in the morning.   Taking   metoprolol  succinate (TOPROL -XL) 25 MG 24 hr tablet TAKE 1 TABLET (25 MG TOTAL) BY MOUTH DAILY. 90 tablet 3    tiZANidine  (ZANAFLEX ) 4 MG tablet Take 4 mg by mouth every 6 (six) hours as needed for muscle spasms.      Allergies[1]  Social History   Tobacco Use   Smoking status: Never   Smokeless tobacco: Never  Substance Use Topics   Alcohol use: Never    Family History  Problem Relation Age of Onset   Heart attack Father        61s   Hypothyroidism Father    Diabetes type II Father    Kidney failure Father    Diabetes type II Mother    Hypertension Mother    Breast cancer Mother 74   Dementia Mother    Hypothyroidism Brother    Diabetes Brother    Breast cancer Maternal Aunt    Breast cancer Cousin    Breast cancer Maternal Aunt    Breast  cancer Cousin    High  blood pressure Other        all   Heart attack Brother    Lung cancer Brother      Review of Systems  Constitutional: Negative.   HENT: Negative.    Eyes: Negative.   Respiratory: Negative.    Cardiovascular:        HTN and hx of blood clots  Gastrointestinal:  Positive for abdominal pain.  Endocrine: Negative.   Genitourinary: Negative.   Musculoskeletal:  Positive for arthralgias.  Skin:  Positive for rash.  Allergic/Immunologic: Negative.   Neurological: Negative.   Hematological: Negative.   Psychiatric/Behavioral:         Depression    Objective:  Physical Exam Constitutional:      Appearance: Normal appearance. She is obese.  HENT:     Head: Normocephalic and atraumatic.     Nose: Nose normal.  Eyes:     Pupils: Pupils are equal, round, and reactive to light.  Cardiovascular:     Pulses: Normal pulses.  Pulmonary:     Effort: Pulmonary effort is normal.  Musculoskeletal:        General: Tenderness present.     Cervical back: Normal range of motion and neck supple.     Comments: She has limited internal/external rotation of the hip with pain. Negative straight leg raise.  Skin:    General: Skin is warm and dry.  Neurological:     General: No focal deficit present.     Mental Status: She is alert and oriented to person, place, and time. Mental status is at baseline.  Psychiatric:        Mood and Affect: Mood normal.        Behavior: Behavior normal.        Thought Content: Thought content normal.        Judgment: Judgment normal.     Vital signs in last 24 hours:    Labs:   Estimated body mass index is 35.3 kg/m as calculated from the following:   Height as of 08/12/24: 5' 2 (1.575 m).   Weight as of 08/12/24: 87.5 kg.   Imaging Review Plain radiographs demonstrate  AP of the pelvis and lateral of the left hip shows left hip osteoarthritis with joint space narrowing. There is no collapse of the femoral head.   Assessment/Plan:  End stage  arthritis, left hip(s)  The patient history, physical examination, clinical judgement of the provider and imaging studies are consistent with end stage degenerative joint disease of the left hip(s) and total hip arthroplasty is deemed medically necessary. The treatment options including medical management, injection therapy, arthroscopy and arthroplasty were discussed at length. The risks and benefits of total hip arthroplasty were presented and reviewed. The risks due to aseptic loosening, infection, stiffness, dislocation/subluxation,  thromboembolic complications and other imponderables were discussed.  The patient acknowledged the explanation, agreed to proceed with the plan and consent was signed. Patient is being admitted for inpatient treatment for surgery, pain control, PT, OT, prophylactic antibiotics, VTE prophylaxis, progressive ambulation and ADL's and discharge planning.The patient is planning to be discharged home with home health services   Pt does have a prior history of blood clots.   Patient's anticipated LOS is less than 2 midnights, meeting these requirements:  - Lives within 1 hour of care - Has a competent adult at home to recover with post-op recover    [1]  Allergies Allergen Reactions   Dilaudid  [Hydromorphone  Hcl]  Other (See Comments)    Hallucinations/ argumentative, goes beserk   Heparin  Other (See Comments)    HIT ab positive  (SRA negative 11/25/17)    Ambien  [Zolpidem  Tartrate] Other (See Comments)    Up sleep walking and eating   Belsomra [Suvorexant]     ineffective Other reaction(s): Other ineffective   Codeine Nausea And Vomiting   Morphine  And Codeine Other (See Comments)    Flushing and feeling hot Tolerates with Diphenhydramine    Penicillins Rash and Other (See Comments)    Caused Headaches also Has patient had a PCN reaction causing immediate rash, facial/tongue/throat swelling, SOB or lightheadedness with hypotension: No Has patient had a PCN  reaction causing severe rash involving mucus membranes or skin necrosis: No Has patient had a PCN reaction that required hospitalization No Has patient had a PCN reaction occurring within the last 10 years: No If all of the above answers are NO, then may proceed with Cephalosporin use.     Statins Other (See Comments)    Muscle weakness, cramps and aching all over body   Ceftin [Cefuroxime Axetil] Other (See Comments)    Stomach pain    Elavil [Amitriptyline] Other (See Comments)    Loopy feeling   Lovaza  [Omega-3-Acid  Ethyl Esters (Fish)] Other (See Comments)    Stomach pain    Lunesta [Eszopiclone] Other (See Comments)    Causes dizziness   Lyrica [Pregabalin] Nausea Only   Metformin  And Related Nausea Only

## 2024-08-19 ENCOUNTER — Encounter (HOSPITAL_COMMUNITY): Payer: Self-pay | Admitting: Orthopedic Surgery

## 2024-08-19 ENCOUNTER — Other Ambulatory Visit: Payer: Self-pay

## 2024-08-19 ENCOUNTER — Observation Stay (HOSPITAL_COMMUNITY)
Admission: RE | Admit: 2024-08-19 | Discharge: 2024-08-20 | Disposition: A | Attending: Orthopedic Surgery | Admitting: Orthopedic Surgery

## 2024-08-19 ENCOUNTER — Ambulatory Visit (HOSPITAL_COMMUNITY): Payer: Self-pay | Admitting: Medical

## 2024-08-19 ENCOUNTER — Encounter (HOSPITAL_COMMUNITY): Admission: RE | Disposition: A | Payer: Self-pay | Source: Home / Self Care | Attending: Orthopedic Surgery

## 2024-08-19 ENCOUNTER — Ambulatory Visit (HOSPITAL_COMMUNITY)

## 2024-08-19 DIAGNOSIS — N1831 Chronic kidney disease, stage 3a: Secondary | ICD-10-CM | POA: Diagnosis not present

## 2024-08-19 DIAGNOSIS — Z79899 Other long term (current) drug therapy: Secondary | ICD-10-CM | POA: Diagnosis not present

## 2024-08-19 DIAGNOSIS — Z96642 Presence of left artificial hip joint: Secondary | ICD-10-CM

## 2024-08-19 DIAGNOSIS — I129 Hypertensive chronic kidney disease with stage 1 through stage 4 chronic kidney disease, or unspecified chronic kidney disease: Secondary | ICD-10-CM | POA: Diagnosis not present

## 2024-08-19 DIAGNOSIS — M1612 Unilateral primary osteoarthritis, left hip: Secondary | ICD-10-CM | POA: Diagnosis not present

## 2024-08-19 DIAGNOSIS — Z8673 Personal history of transient ischemic attack (TIA), and cerebral infarction without residual deficits: Secondary | ICD-10-CM | POA: Diagnosis not present

## 2024-08-19 DIAGNOSIS — Z794 Long term (current) use of insulin: Secondary | ICD-10-CM | POA: Diagnosis not present

## 2024-08-19 DIAGNOSIS — E1022 Type 1 diabetes mellitus with diabetic chronic kidney disease: Secondary | ICD-10-CM | POA: Diagnosis not present

## 2024-08-19 DIAGNOSIS — M25552 Pain in left hip: Secondary | ICD-10-CM | POA: Diagnosis present

## 2024-08-19 DIAGNOSIS — I2510 Atherosclerotic heart disease of native coronary artery without angina pectoris: Secondary | ICD-10-CM | POA: Insufficient documentation

## 2024-08-19 DIAGNOSIS — Z01818 Encounter for other preprocedural examination: Secondary | ICD-10-CM

## 2024-08-19 DIAGNOSIS — E039 Hypothyroidism, unspecified: Secondary | ICD-10-CM | POA: Diagnosis not present

## 2024-08-19 HISTORY — PX: TOTAL HIP ARTHROPLASTY: SHX124

## 2024-08-19 LAB — GLUCOSE, CAPILLARY
Glucose-Capillary: 108 mg/dL — ABNORMAL HIGH (ref 70–99)
Glucose-Capillary: 213 mg/dL — ABNORMAL HIGH (ref 70–99)
Glucose-Capillary: 195 mg/dL — ABNORMAL HIGH (ref 70–99)
Glucose-Capillary: 246 mg/dL — ABNORMAL HIGH (ref 70–99)

## 2024-08-19 LAB — TYPE AND SCREEN
ABO/RH(D): O POS
Antibody Screen: NEGATIVE

## 2024-08-19 MED ORDER — IRBESARTAN 150 MG PO TABS
300.0000 mg | ORAL_TABLET | Freq: Every day | ORAL | Status: DC
  Administered 2024-08-19: 300 mg via ORAL
  Filled 2024-08-19 (×2): qty 2

## 2024-08-19 MED ORDER — TRANEXAMIC ACID-NACL 1000-0.7 MG/100ML-% IV SOLN
1000.0000 mg | Freq: Once | INTRAVENOUS | Status: AC
  Administered 2024-08-19: 1000 mg via INTRAVENOUS
  Filled 2024-08-19: qty 100

## 2024-08-19 MED ORDER — TRAMADOL HCL 50 MG PO TABS
50.0000 mg | ORAL_TABLET | Freq: Four times a day (QID) | ORAL | Status: DC
  Administered 2024-08-19 – 2024-08-20 (×4): 50 mg via ORAL
  Filled 2024-08-19 (×4): qty 1

## 2024-08-19 MED ORDER — ACETAMINOPHEN 500 MG PO TABS
1000.0000 mg | ORAL_TABLET | Freq: Four times a day (QID) | ORAL | Status: AC
  Administered 2024-08-19 – 2024-08-20 (×4): 1000 mg via ORAL
  Filled 2024-08-19 (×4): qty 2

## 2024-08-19 MED ORDER — MIDAZOLAM HCL 2 MG/2ML IJ SOLN
INTRAMUSCULAR | Status: AC
  Filled 2024-08-19: qty 2

## 2024-08-19 MED ORDER — FENTANYL CITRATE (PF) 250 MCG/5ML IJ SOLN
INTRAMUSCULAR | Status: AC
  Filled 2024-08-19: qty 5

## 2024-08-19 MED ORDER — ONDANSETRON HCL 4 MG/2ML IJ SOLN
INTRAMUSCULAR | Status: DC | PRN
  Administered 2024-08-19: 4 mg via INTRAVENOUS

## 2024-08-19 MED ORDER — FENTANYL CITRATE (PF) 50 MCG/ML IJ SOSY
PREFILLED_SYRINGE | INTRAMUSCULAR | Status: AC
  Filled 2024-08-19: qty 1

## 2024-08-19 MED ORDER — POLYETHYLENE GLYCOL 3350 17 G PO PACK: 17.0000 g | PACK | Freq: Every day | ORAL | Status: DC | PRN

## 2024-08-19 MED ORDER — IRBESARTAN-HYDROCHLOROTHIAZIDE 300-12.5 MG PO TABS: 1.0000 | ORAL_TABLET | Freq: Every morning | ORAL | Status: DC

## 2024-08-19 MED ORDER — LORAZEPAM 0.5 MG PO TABS: 0.5000 mg | ORAL_TABLET | Freq: Three times a day (TID) | ORAL | Status: DC | PRN

## 2024-08-19 MED ORDER — BUPIVACAINE LIPOSOME 1.3 % IJ SUSP
INTRAMUSCULAR | Status: DC | PRN
  Administered 2024-08-19: 10 mL

## 2024-08-19 MED ORDER — ACETAMINOPHEN 10 MG/ML IV SOLN
INTRAVENOUS | Status: AC
  Filled 2024-08-19: qty 100

## 2024-08-19 MED ORDER — LEVOTHYROXINE SODIUM 75 MCG PO TABS
150.0000 ug | ORAL_TABLET | Freq: Every day | ORAL | Status: DC
  Administered 2024-08-20: 150 ug via ORAL
  Filled 2024-08-19: qty 2

## 2024-08-19 MED ORDER — PANTOPRAZOLE SODIUM 40 MG PO TBEC: 40.0000 mg | DELAYED_RELEASE_TABLET | Freq: Every day | ORAL | Status: DC

## 2024-08-19 MED ORDER — CLOBETASOL PROPIONATE 0.05 % EX LIQD: 1.0000 | Freq: Every evening | CUTANEOUS | Status: DC | PRN

## 2024-08-19 MED ORDER — FENTANYL CITRATE (PF) 50 MCG/ML IJ SOSY
25.0000 ug | PREFILLED_SYRINGE | INTRAMUSCULAR | Status: DC | PRN
  Administered 2024-08-19 (×2): 50 ug via INTRAVENOUS

## 2024-08-19 MED ORDER — METOCLOPRAMIDE HCL 5 MG/ML IJ SOLN: 5.0000 mg | Freq: Three times a day (TID) | INTRAMUSCULAR | Status: DC | PRN

## 2024-08-19 MED ORDER — PROPOFOL 10 MG/ML IV BOLUS
INTRAVENOUS | Status: AC
  Filled 2024-08-19: qty 20

## 2024-08-19 MED ORDER — LIDOCAINE HCL (PF) 2 % IJ SOLN
INTRAMUSCULAR | Status: AC
  Filled 2024-08-19: qty 5

## 2024-08-19 MED ORDER — OXYCODONE HCL 5 MG PO TABS
5.0000 mg | ORAL_TABLET | ORAL | Status: DC | PRN
  Administered 2024-08-19 – 2024-08-20 (×3): 10 mg via ORAL
  Filled 2024-08-19 (×3): qty 2

## 2024-08-19 MED ORDER — METHOCARBAMOL 500 MG PO TABS
500.0000 mg | ORAL_TABLET | Freq: Four times a day (QID) | ORAL | Status: DC | PRN
  Administered 2024-08-19 – 2024-08-20 (×3): 500 mg via ORAL
  Filled 2024-08-19 (×3): qty 1

## 2024-08-19 MED ORDER — KETAMINE HCL 50 MG/5ML IJ SOSY
PREFILLED_SYRINGE | INTRAMUSCULAR | Status: AC
  Filled 2024-08-19: qty 5

## 2024-08-19 MED ORDER — DROPERIDOL 2.5 MG/ML IJ SOLN: 0.6250 mg | Freq: Once | INTRAMUSCULAR | Status: DC | PRN

## 2024-08-19 MED ORDER — PHENYLEPHRINE HCL-NACL 20-0.9 MG/250ML-% IV SOLN
INTRAVENOUS | Status: DC | PRN
  Administered 2024-08-19: 40 ug/min via INTRAVENOUS
  Administered 2024-08-19: 80 ug via INTRAVENOUS

## 2024-08-19 MED ORDER — FLEET ENEMA RE ENEM: 1.0000 | ENEMA | Freq: Once | RECTAL | Status: DC | PRN

## 2024-08-19 MED ORDER — INSULIN LISPRO 100 UNIT/ML IJ SOLN: 0.0000 [IU] | INTRAMUSCULAR | Status: DC | PRN

## 2024-08-19 MED ORDER — DOCUSATE SODIUM 100 MG PO CAPS: 100.0000 mg | ORAL_CAPSULE | Freq: Two times a day (BID) | ORAL | 2 refills | Status: AC

## 2024-08-19 MED ORDER — ONDANSETRON HCL 4 MG PO TABS: 4.0000 mg | ORAL_TABLET | Freq: Four times a day (QID) | ORAL | Status: DC | PRN

## 2024-08-19 MED ORDER — ROCURONIUM BROMIDE 10 MG/ML (PF) SYRINGE
PREFILLED_SYRINGE | INTRAVENOUS | Status: AC
  Filled 2024-08-19: qty 10

## 2024-08-19 MED ORDER — LACTATED RINGERS IV SOLN: INTRAVENOUS | Status: DC

## 2024-08-19 MED ORDER — ASPIRIN 325 MG PO TBEC
325.0000 mg | DELAYED_RELEASE_TABLET | Freq: Every day | ORAL | Status: DC
  Administered 2024-08-20: 325 mg via ORAL
  Filled 2024-08-19: qty 1

## 2024-08-19 MED ORDER — DEXAMETHASONE SOD PHOSPHATE PF 10 MG/ML IJ SOLN
INTRAMUSCULAR | Status: AC
  Filled 2024-08-19: qty 1

## 2024-08-19 MED ORDER — POTASSIUM 99 MG PO TABS: 99.0000 mg | ORAL_TABLET | Freq: Every morning | ORAL | Status: DC

## 2024-08-19 MED ORDER — SUGAMMADEX SODIUM 200 MG/2ML IV SOLN
INTRAVENOUS | Status: AC
  Filled 2024-08-19: qty 2

## 2024-08-19 MED ORDER — ROCURONIUM BROMIDE 100 MG/10ML IV SOLN
INTRAVENOUS | Status: DC | PRN
  Administered 2024-08-19: 50 mg via INTRAVENOUS

## 2024-08-19 MED ORDER — DOCUSATE SODIUM 100 MG PO CAPS
100.0000 mg | ORAL_CAPSULE | Freq: Two times a day (BID) | ORAL | Status: DC
  Administered 2024-08-19 – 2024-08-20 (×2): 100 mg via ORAL
  Filled 2024-08-19 (×2): qty 1

## 2024-08-19 MED ORDER — ONDANSETRON HCL 4 MG/2ML IJ SOLN
INTRAMUSCULAR | Status: AC
  Filled 2024-08-19: qty 2

## 2024-08-19 MED ORDER — TRANEXAMIC ACID-NACL 1000-0.7 MG/100ML-% IV SOLN
1000.0000 mg | INTRAVENOUS | Status: AC
  Administered 2024-08-19: 1000 mg via INTRAVENOUS
  Filled 2024-08-19: qty 100

## 2024-08-19 MED ORDER — ACETAMINOPHEN 325 MG PO TABS: 325.0000 mg | ORAL_TABLET | Freq: Four times a day (QID) | ORAL | Status: DC | PRN

## 2024-08-19 MED ORDER — GABAPENTIN 300 MG PO CAPS
900.0000 mg | ORAL_CAPSULE | Freq: Every day | ORAL | Status: DC
  Administered 2024-08-19: 900 mg via ORAL
  Filled 2024-08-19: qty 3

## 2024-08-19 MED ORDER — METOPROLOL SUCCINATE ER 25 MG PO TB24
25.0000 mg | ORAL_TABLET | Freq: Every day | ORAL | Status: DC
  Administered 2024-08-20: 25 mg via ORAL
  Filled 2024-08-19: qty 1

## 2024-08-19 MED ORDER — ACETAMINOPHEN 10 MG/ML IV SOLN
1000.0000 mg | Freq: Once | INTRAVENOUS | Status: DC | PRN
  Administered 2024-08-19: 1000 mg via INTRAVENOUS

## 2024-08-19 MED ORDER — DEXAMETHASONE SOD PHOSPHATE PF 10 MG/ML IJ SOLN
10.0000 mg | Freq: Once | INTRAMUSCULAR | Status: AC
  Administered 2024-08-20: 10 mg via INTRAVENOUS
  Filled 2024-08-19: qty 1

## 2024-08-19 MED ORDER — IMIPRAMINE HCL 25 MG PO TABS
50.0000 mg | ORAL_TABLET | Freq: Every day | ORAL | Status: DC
  Administered 2024-08-19: 50 mg via ORAL
  Filled 2024-08-19: qty 2

## 2024-08-19 MED ORDER — OXYCODONE HCL 5 MG/5ML PO SOLN: 5.0000 mg | Freq: Once | ORAL | Status: DC | PRN

## 2024-08-19 MED ORDER — VANCOMYCIN HCL IN DEXTROSE 1-5 GM/200ML-% IV SOLN
1000.0000 mg | INTRAVENOUS | Status: AC
  Administered 2024-08-19: 1000 mg via INTRAVENOUS
  Filled 2024-08-19: qty 200

## 2024-08-19 MED ORDER — PHENOL 1.4 % MT LIQD: 1.0000 | OROMUCOSAL | Status: DC | PRN

## 2024-08-19 MED ORDER — AMLODIPINE BESYLATE 5 MG PO TABS
2.5000 mg | ORAL_TABLET | Freq: Every day | ORAL | Status: DC
  Filled 2024-08-19: qty 1

## 2024-08-19 MED ORDER — CHLORHEXIDINE GLUCONATE 0.12 % MT SOLN
15.0000 mL | Freq: Once | OROMUCOSAL | Status: AC
  Administered 2024-08-19: 15 mL via OROMUCOSAL

## 2024-08-19 MED ORDER — INSULIN ASPART 100 UNIT/ML IJ SOLN: 0.0000 [IU] | INTRAMUSCULAR | Status: DC | PRN

## 2024-08-19 MED ORDER — BUPIVACAINE LIPOSOME 1.3 % IJ SUSP: 10.0000 mL | Freq: Once | INTRAMUSCULAR | Status: DC

## 2024-08-19 MED ORDER — KETAMINE HCL 50 MG/5ML IJ SOSY
PREFILLED_SYRINGE | INTRAMUSCULAR | Status: DC | PRN
  Administered 2024-08-19: 10 mg via INTRAVENOUS

## 2024-08-19 MED ORDER — LEVOFLOXACIN IN D5W 500 MG/100ML IV SOLN
500.0000 mg | INTRAVENOUS | Status: AC
  Administered 2024-08-19: 500 mg via INTRAVENOUS
  Filled 2024-08-19: qty 100

## 2024-08-19 MED ORDER — FENTANYL CITRATE (PF) 100 MCG/2ML IJ SOLN
INTRAMUSCULAR | Status: DC | PRN
  Administered 2024-08-19: 100 ug via INTRAVENOUS
  Administered 2024-08-19: 50 ug via INTRAVENOUS

## 2024-08-19 MED ORDER — LIDOCAINE HCL (CARDIAC) PF 100 MG/5ML IV SOSY
PREFILLED_SYRINGE | INTRAVENOUS | Status: DC | PRN
  Administered 2024-08-19: 60 mg via INTRAVENOUS

## 2024-08-19 MED ORDER — SUGAMMADEX SODIUM 200 MG/2ML IV SOLN
INTRAVENOUS | Status: DC | PRN
  Administered 2024-08-19: 200 mg via INTRAVENOUS

## 2024-08-19 MED ORDER — DEXAMETHASONE SODIUM PHOSPHATE 4 MG/ML IJ SOLN
INTRAMUSCULAR | Status: DC | PRN
  Administered 2024-08-19: 5 mg via INTRAVENOUS

## 2024-08-19 MED ORDER — DOXYCYCLINE HYCLATE 50 MG PO CAPS
50.0000 mg | ORAL_CAPSULE | Freq: Every day | ORAL | Status: DC
  Filled 2024-08-19: qty 1

## 2024-08-19 MED ORDER — MENTHOL 3 MG MT LOZG: 1.0000 | LOZENGE | OROMUCOSAL | Status: DC | PRN

## 2024-08-19 MED ORDER — DIPHENHYDRAMINE HCL 12.5 MG/5ML PO ELIX: 12.5000 mg | ORAL_SOLUTION | ORAL | Status: DC | PRN

## 2024-08-19 MED ORDER — ASPIRIN 325 MG PO TBEC: 325.0000 mg | DELAYED_RELEASE_TABLET | Freq: Two times a day (BID) | ORAL | 0 refills | Status: DC

## 2024-08-19 MED ORDER — ESCITALOPRAM OXALATE 10 MG PO TABS: 5.0000 mg | ORAL_TABLET | Freq: Every day | ORAL | Status: DC | PRN

## 2024-08-19 MED ORDER — SODIUM CHLORIDE 0.9 % IR SOLN
Status: DC | PRN
  Administered 2024-08-19: 1000 mL

## 2024-08-19 MED ORDER — METHOCARBAMOL 1000 MG/10ML IJ SOLN: 500.0000 mg | Freq: Four times a day (QID) | INTRAMUSCULAR | Status: DC | PRN

## 2024-08-19 MED ORDER — LORATADINE 10 MG PO TABS: 10.0000 mg | ORAL_TABLET | Freq: Every day | ORAL | Status: DC

## 2024-08-19 MED ORDER — OXYCODONE HCL 5 MG PO TABS: 10.0000 mg | ORAL_TABLET | ORAL | Status: DC | PRN

## 2024-08-19 MED ORDER — HYDROMORPHONE HCL 1 MG/ML IJ SOLN: 0.2500 mg | INTRAMUSCULAR | Status: DC | PRN

## 2024-08-19 MED ORDER — OXYCODONE HCL 5 MG PO TABS: 5.0000 mg | ORAL_TABLET | Freq: Once | ORAL | Status: DC | PRN

## 2024-08-19 MED ORDER — INSULIN ASPART 100 UNIT/ML IJ SOLN
0.0000 [IU] | Freq: Three times a day (TID) | INTRAMUSCULAR | Status: DC
  Administered 2024-08-19: 5 [IU] via SUBCUTANEOUS
  Administered 2024-08-19: 3 [IU] via SUBCUTANEOUS
  Administered 2024-08-20: 5 [IU] via SUBCUTANEOUS
  Filled 2024-08-19: qty 5
  Filled 2024-08-19 (×2): qty 3
  Filled 2024-08-19: qty 5

## 2024-08-19 MED ORDER — PROPOFOL 1000 MG/100ML IV EMUL
INTRAVENOUS | Status: AC
  Filled 2024-08-19: qty 100

## 2024-08-19 MED ORDER — PROMETHAZINE HCL 25 MG PO TABS: 25.0000 mg | ORAL_TABLET | Freq: Three times a day (TID) | ORAL | Status: DC | PRN

## 2024-08-19 MED ORDER — ORAL CARE MOUTH RINSE: 15.0000 mL | Freq: Once | OROMUCOSAL | Status: AC

## 2024-08-19 MED ORDER — BUPIVACAINE LIPOSOME 1.3 % IJ SUSP
INTRAMUSCULAR | Status: AC
  Filled 2024-08-19: qty 10

## 2024-08-19 MED ORDER — POVIDONE-IODINE 10 % EX SWAB
2.0000 | Freq: Once | CUTANEOUS | Status: AC
  Administered 2024-08-19: 2 via TOPICAL

## 2024-08-19 MED ORDER — TIZANIDINE HCL 4 MG PO TABS: 4.0000 mg | ORAL_TABLET | Freq: Four times a day (QID) | ORAL | 0 refills | Status: DC | PRN

## 2024-08-19 MED ORDER — PANTOPRAZOLE SODIUM 40 MG PO TBEC
80.0000 mg | DELAYED_RELEASE_TABLET | Freq: Every day | ORAL | Status: DC
  Administered 2024-08-19 – 2024-08-20 (×2): 80 mg via ORAL
  Filled 2024-08-19 (×2): qty 2

## 2024-08-19 MED ORDER — BUPIVACAINE-EPINEPHRINE (PF) 0.25% -1:200000 IJ SOLN
INTRAMUSCULAR | Status: AC
  Filled 2024-08-19: qty 30

## 2024-08-19 MED ORDER — ONDANSETRON HCL 4 MG/2ML IJ SOLN: 4.0000 mg | Freq: Four times a day (QID) | INTRAMUSCULAR | Status: DC | PRN

## 2024-08-19 MED ORDER — WATER FOR IRRIGATION, STERILE IR SOLN
Status: DC | PRN
  Administered 2024-08-19: 2000 mL

## 2024-08-19 MED ORDER — MIDAZOLAM HCL 5 MG/5ML IJ SOLN
INTRAMUSCULAR | Status: DC | PRN
  Administered 2024-08-19: 2 mg via INTRAVENOUS

## 2024-08-19 MED ORDER — OXYCODONE-ACETAMINOPHEN 5-325 MG PO TABS: 1.0000 | ORAL_TABLET | ORAL | 0 refills | Status: DC | PRN

## 2024-08-19 MED ORDER — BUPIVACAINE-EPINEPHRINE 0.25% -1:200000 IJ SOLN
INTRAMUSCULAR | Status: DC | PRN
  Administered 2024-08-19: 30 mL

## 2024-08-19 MED ORDER — KCL IN DEXTROSE-NACL 20-5-0.45 MEQ/L-%-% IV SOLN
INTRAVENOUS | Status: DC
  Filled 2024-08-19 (×4): qty 1000

## 2024-08-19 MED ORDER — PROPOFOL 10 MG/ML IV BOLUS
INTRAVENOUS | Status: DC | PRN
  Administered 2024-08-19: 125 ug/kg/min via INTRAVENOUS
  Administered 2024-08-19: 120 mg via INTRAVENOUS

## 2024-08-19 MED ORDER — MEPERIDINE HCL 25 MG/ML IJ SOLN: 6.2500 mg | INTRAMUSCULAR | Status: DC | PRN

## 2024-08-19 MED ORDER — BISACODYL 5 MG PO TBEC: 5.0000 mg | DELAYED_RELEASE_TABLET | Freq: Every day | ORAL | Status: DC | PRN

## 2024-08-19 MED ORDER — HYDROCHLOROTHIAZIDE 12.5 MG PO TABS
12.5000 mg | ORAL_TABLET | Freq: Every day | ORAL | Status: DC
  Administered 2024-08-19: 12.5 mg via ORAL
  Filled 2024-08-19 (×2): qty 1

## 2024-08-19 MED ORDER — METOCLOPRAMIDE HCL 5 MG PO TABS: 5.0000 mg | ORAL_TABLET | Freq: Three times a day (TID) | ORAL | Status: DC | PRN

## 2024-08-19 NOTE — Plan of Care (Signed)
 " Problem: Education: Goal: Knowledge of the prescribed therapeutic regimen will improve Outcome: Progressing   Problem: Bowel/Gastric: Goal: Gastrointestinal status for postoperative course will improve Outcome: Progressing   Problem: Cardiac: Goal: Ability to maintain an adequate cardiac output Outcome: Progressing Goal: Will show no evidence of cardiac arrhythmias Outcome: Progressing   Problem: Nutritional: Goal: Will attain and maintain optimal nutritional status Outcome: Progressing   Problem: Neurological: Goal: Will regain or maintain usual level of consciousness Outcome: Progressing   Problem: Clinical Measurements: Goal: Ability to maintain clinical measurements within normal limits Outcome: Progressing Goal: Postoperative complications will be avoided or minimized Outcome: Progressing   Problem: Respiratory: Goal: Will regain and/or maintain adequate ventilation Outcome: Progressing Goal: Respiratory status will improve Outcome: Progressing   Problem: Skin Integrity: Goal: Demonstrates signs of wound healing without infection Outcome: Progressing   Problem: Urinary Elimination: Goal: Will remain free from infection Outcome: Progressing Goal: Ability to achieve and maintain adequate urine output Outcome: Progressing   Problem: Education: Goal: Knowledge of General Education information will improve Description: Including pain rating scale, medication(s)/side effects and non-pharmacologic comfort measures Outcome: Progressing   Problem: Health Behavior/Discharge Planning: Goal: Ability to manage health-related needs will improve Outcome: Progressing   Problem: Clinical Measurements: Goal: Ability to maintain clinical measurements within normal limits will improve Outcome: Progressing Goal: Will remain free from infection Outcome: Progressing Goal: Diagnostic test results will improve Outcome: Progressing Goal: Respiratory complications will  improve Outcome: Progressing Goal: Cardiovascular complication will be avoided Outcome: Progressing   Problem: Activity: Goal: Risk for activity intolerance will decrease Outcome: Progressing   Problem: Nutrition: Goal: Adequate nutrition will be maintained Outcome: Progressing   Problem: Coping: Goal: Level of anxiety will decrease Outcome: Progressing   Problem: Elimination: Goal: Will not experience complications related to bowel motility Outcome: Progressing Goal: Will not experience complications related to urinary retention Outcome: Progressing   Problem: Pain Managment: Goal: General experience of comfort will improve and/or be controlled Outcome: Progressing   Problem: Safety: Goal: Ability to remain free from injury will improve Outcome: Progressing   Problem: Skin Integrity: Goal: Risk for impaired skin integrity will decrease Outcome: Progressing   Problem: Education: Goal: Ability to describe self-care measures that may prevent or decrease complications (Diabetes Survival Skills Education) will improve Outcome: Progressing Goal: Individualized Educational Video(s) Outcome: Progressing   Problem: Coping: Goal: Ability to adjust to condition or change in health will improve Outcome: Progressing   Problem: Fluid Volume: Goal: Ability to maintain a balanced intake and output will improve Outcome: Progressing   Problem: Health Behavior/Discharge Planning: Goal: Ability to identify and utilize available resources and services will improve Outcome: Progressing Goal: Ability to manage health-related needs will improve Outcome: Progressing   Problem: Metabolic: Goal: Ability to maintain appropriate glucose levels will improve Outcome: Progressing   Problem: Nutritional: Goal: Maintenance of adequate nutrition will improve Outcome: Progressing Goal: Progress toward achieving an optimal weight will improve Outcome: Progressing   Problem: Skin  Integrity: Goal: Risk for impaired skin integrity will decrease Outcome: Progressing   Problem: Tissue Perfusion: Goal: Adequacy of tissue perfusion will improve Outcome: Progressing   Problem: Education: Goal: Knowledge of the prescribed therapeutic regimen will improve Outcome: Progressing Goal: Understanding of discharge needs will improve Outcome: Progressing Goal: Individualized Educational Video(s) Outcome: Progressing   Problem: Activity: Goal: Ability to avoid complications of mobility impairment will improve Outcome: Progressing Goal: Ability to tolerate increased activity will improve Outcome: Progressing   Problem: Clinical Measurements: Goal: Postoperative complications will be avoided or  minimized Outcome: Progressing   "

## 2024-08-19 NOTE — Op Note (Signed)
 PATIENT ID:      [name]  MRN:     [id number] DOB/AGE:    02-23-1951 / 74 y.o.       OPERATIVE REPORT    DATE OF PROCEDURE:  08/19/2024       PREOPERATIVE DIAGNOSIS:  left hip osteoarthritis                                                       Estimated body mass index is 34.75 kg/m as calculated from the following:   Height as of this encounter: 5' 2 (1.575 m).   Weight as of this encounter: 86.2 kg.     POSTOPERATIVE DIAGNOSIS:  left hip osteoarthritis                                                           PROCEDURE:  1.  Left total hip arthroplasty using a 50 mm DePuy Pinnacle GRIPTION Cup, [employer], +4 liner, a +5 32 mm ceramic head,  and a # 6 high offset Actis stem, 2.interpretation of multiple intraoperative fluoroscopic images   SURGEON: Norleen LITTIE Gavel    ASSISTANT:   Camellia Ellen PA-C  (present throughout entire procedure and necessary for timely completion of the procedure)  ANESTHESIA: General BLOOD LOSS: 300 cc Tranexamic Acid : 1 gram IV DRAINS: None COMPLICATIONS: None    NDICATIONS FOR PROCEDURE:Patient with end-stage arthritis of the left hip.  X-rays show bone-on-bone arthritic changes. Despite conservative measures with observation, anti-inflammatory medicine, narcotics, use of a cane, has severe unremitting pain and can ambulate only less than 1 block before resting.  Patient desires elective left total hip arthroplasty to decrease pain and increase function. The risks, benefits, and alternatives were discussed at length including but not limited to the risks of infection, bleeding, nerve injury, stiffness, blood clots, the need for revision surgery, cardiopulmonary complications, among others, and they were willing to proceed.Benefits have been discussed. Questions answered.     PROCEDURE IN DETAIL: The patient was identified by armband,  received preoperative IV antibiotics in the holding area at [facility], taken to the  operating room , appropriate anesthetic monitors  were attached and spinal anesthesia was induced.  The patient was placed onto the Hana bed and all bony prominences were well-padded.The left hip was prepped and draped for an anterior approach to the hip.  An incision was made and the subcutaneous dissection was down to the level of the tensor fascia.  The fascia was opened and finger dissected.  The bleeders coming across the anterior portion of the hip were identified and cauterized. Retractors were put in place above and below the femoral neck.  The capsule was opened and tagged and a provisional neck cut was made.  The head was removed and sized on the back table.  The acetabulum was sequentially reamed to a level of 49 mm and a 50 mm GRIPTION-coated pinnacle cup was hammered into place with 45 of lateral opening and 30 of anteversion.fluoroscopy was used to ensure this position of the cup.  The +4 polyethylene final was placed at this point.  Attention  was turned towards the femur where the leg was actually rotated, extended, and adduction did.  The femur was sequentially broached until a size of 6 high offset broach gave a perfect fit and fill.at this point a   +5 mm delta ceramic hip ball was placed and the hip reduced.  Fluoroscopic images were taken to assess the leg length, fit and fill of the stem, and cup position.  We were happy with the construct at this point.  The 6 high offset broach was removed and a final 6 Actis stem with high offset  and a +5 mm 32 mm ceramic hip ball was placed and reduced.  Final images were taken to make certain there were happy with the position at this point.   The capsule was closed with #1 Vicryl suture.  The tensor fascia was closed with 0 Vicryl suture.  The skin was then closed with combination of 0 and 2-0 Vicryl suture.  The top layer was with 3-0 Monocryl suture.  Benzoin and Steri-Strips were applied  and a sterile compressive dressing was applied and the  patient taken to recovery room she noted be in satisfactory condition.  Past medical Motion for the procedure was approximately 300 cc.  Of note Camellia Ellen was present the entire case and assisted by retraction of tissues, manipulation of the leg, and closing the minimize or time.   Norleen LITTIE Gavel 08/19/2024, 3:33 PM

## 2024-08-19 NOTE — Interval H&P Note (Signed)
 History and Physical Interval Note:  08/19/2024 8:32 AM  [name]  has presented today for surgery, with the diagnosis of left hip osteoarthritis.  The various methods of treatment have been discussed with the patient and family. After consideration of risks, benefits and other options for treatment, the patient has consented to  Procedures: ARTHROPLASTY, HIP, TOTAL, ANTERIOR APPROACH (Left) as a surgical intervention.  The patient's history has been reviewed, patient examined, no change in status, stable for surgery.  I have reviewed the patient's chart and labs.  Questions were answered to the patient's satisfaction.     [name]

## 2024-08-19 NOTE — Anesthesia Postprocedure Evaluation (Signed)
"   Anesthesia Post Note  Patient: [name]  Procedure(s) Performed: ARTHROPLASTY, HIP, TOTAL, ANTERIOR APPROACH (Left: Hip)     Patient location during evaluation: PACU Anesthesia Type: General Level of consciousness: awake and alert Pain management: pain level controlled Vital Signs Assessment: post-procedure vital signs reviewed and stable Respiratory status: spontaneous breathing, nonlabored ventilation, respiratory function stable and patient connected to nasal cannula oxygen Cardiovascular status: blood pressure returned to baseline and stable Postop Assessment: no apparent nausea or vomiting Anesthetic complications: no   No notable events documented.  Last Vitals:  Vitals:   08/19/24 1245 08/19/24 1300  BP: (!) 136/54 (!) 117/91  Pulse: 79 76  Resp: 16 15  Temp:  36.5 C  SpO2: 100% 100%    Last Pain:  Vitals:   08/19/24 1447  TempSrc:   PainSc: Asleep                 Franky JONETTA Bald      "

## 2024-08-19 NOTE — Evaluation (Signed)
 Physical Therapy Evaluation Patient Details Name: [name] MRN: [id number] DOB: Apr 25, 1951 Today's Date: 08/19/2024  History of Present Illness  74 yo female presents to therapy s/p L THA, anterior approach on 08/20/2023 due to failure of conservative measures. Pt is currently L LE WBAT with anterior hip precautions-- avoid hyperextension, external rotation and abduction. 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 At baseline pt is very independent.  She has support at home, DME, 4 steps to enter with R rail, and 1 level home.  Pt had c/o 10/10 pain earlier but is now at a 5/10 and tolerated therapy well.  Pt ambulated 50' with RW and CGA.  Pt expected to progress well.  Provided anterior hip precaution hand out -needs reinforcement with transfers.  Pt will benefit from acute skilled PT to increase their independence and safety with mobility to facilitate discharge.          If plan is discharge home, recommend the following: A little help with bathing/dressing/bathroom;A little help with walking and/or transfers;Assistance with cooking/housework;Help with stairs or ramp for entrance   Can travel by private vehicle        Equipment Recommendations None recommended by PT  Recommendations for Other Services       Functional Status Assessment Patient has had a recent decline in their functional status and demonstrates the ability to make significant improvements in function in a reasonable and predictable amount of time.      Precautions / Restrictions Precautions Precautions: Fall;Anterior Hip Precaution Booklet Issued: Yes (comment) Precaution/Restrictions Comments: Educated on and provided handout on ant precautions Restrictions LLE Weight Bearing Per Provider Order: Weight bearing as tolerated      Mobility  Bed Mobility Overal bed mobility: Needs Assistance Bed Mobility: Supine to Sit     Supine to sit: Min assist     General bed mobility comments: Cues for shifting shoulders and pelvis so as not to abduct L hip with transfer; min A for L leg and lifting trunk    Transfers Overall transfer level: Needs assistance Equipment used: Rolling walker (2 wheels) Transfers: Sit to/from Stand Sit to Stand: Contact guard assist           General transfer comment: cues for hand placement    Ambulation/Gait Ambulation/Gait assistance: Contact guard assist Gait Distance (Feet): 80 Feet Assistive device: Rolling walker (2 wheels) Gait Pattern/deviations: Step-through pattern       General Gait Details: Good RW use, smooth gait pattern but at decreased speed  Stairs            Wheelchair Mobility     Tilt Bed    Modified Rankin (Stroke Patients Only)       Balance Overall balance assessment: Needs assistance Sitting-balance support: No upper extremity supported Sitting balance-Leahy Scale: Good     Standing balance support: Bilateral upper extremity supported, Reliant on assistive device for balance Standing balance-Leahy Scale: Poor Standing balance comment: steady with RW - did not test without  Pertinent Vitals/Pain Pain Assessment Pain Assessment: 0-10 Pain Score: 5  Pain Location: L hip Pain Descriptors / Indicators: Discomfort Pain Intervention(s): Limited activity within patient's tolerance, Monitored during session, Premedicated before session, Repositioned, Ice applied    Home Living Family/patient expects to be  discharged to:: Private residence Living Arrangements: Spouse/significant other Available Help at Discharge: Family;Available 24 hours/day (spouse and son to assist) Type of Home: House Home Access: Stairs to enter   [employer] of Steps: 4 onto porch with rail on the left and 1 into house   Home Layout: One level Home Equipment: [employer] (2 wheels);Cane - single point;Rollator (4 wheels);Shower seat - built in      Prior Function Prior Level of Function : Independent/Modified Independent;Driving                     Extremity/Trunk Assessment   Upper Extremity Assessment Upper Extremity Assessment: Overall WFL for tasks assessed    Lower Extremity Assessment Lower Extremity Assessment: LLE deficits/detail;RLE deficits/detail RLE Deficits / Details: ROM WFL; MMT 5/5 LLE Deficits / Details: Expected post op changes; ROM WFL; MMT: ankle 5/5, knee 3/5 not further tested, hip 1/5    Cervical / Trunk Assessment Cervical / Trunk Assessment: Normal  Communication        Cognition Arousal: Alert Behavior During Therapy: WFL for tasks assessed/performed   PT - Cognitive impairments: No apparent impairments                                 Cueing       General Comments      Exercises     Assessment/Plan    PT Assessment Patient needs continued PT services  PT Problem List Decreased strength;Pain;Decreased range of motion;Decreased activity tolerance;Decreased balance;Decreased mobility;Decreased knowledge of use of DME;Decreased knowledge of precautions       PT Treatment Interventions DME instruction;Therapeutic exercise;Gait training;Stair training;Functional mobility training;Therapeutic activities;Patient/family education;Modalities;Balance training    PT Goals (Current goals can be found in the Care Plan section)  Acute Rehab PT Goals Patient Stated Goal: return home PT Goal Formulation: With patient/family Time For Goal  Achievement: 09/02/24 Potential to Achieve Goals: Good    Frequency 7X/week     Co-evaluation               AM-PAC PT 6 Clicks Mobility  Outcome Measure Help needed turning from your back to your side while in a flat bed without using bedrails?: A Little Help needed moving from lying on your back to sitting on the side of a flat bed without using bedrails?: A Little Help needed moving to and from a bed to a chair (including a wheelchair)?: A Little Help needed standing up from a chair using your arms (e.g., wheelchair or bedside chair)?: A Little Help needed to walk in hospital room?: A Little Help needed climbing 3-5 steps with a railing? : A Lot 6 Click Score: 17    End of Session Equipment Utilized During Treatment: Gait belt Activity Tolerance: Patient tolerated treatment well Patient left: with chair alarm set;in chair;with call bell/phone within reach;with SCD's reapplied Nurse Communication: Mobility status PT Visit Diagnosis: Other abnormalities of gait and mobility (R26.89);Muscle weakness (generalized) (M62.81)    Time: 8381-8361 PT Time Calculation (min) (ACUTE ONLY): 20 min   Charges:   PT Evaluation $PT Eval Low Complexity: 1 Low   PT General Charges $$ ACUTE PT VISIT: 1  Visit         Rikayla Demmon, PT Acute Rehab [facility] [phone]   Benjiman VEAR Mulberry 08/19/2024, 4:46 PM

## 2024-08-19 NOTE — Anesthesia Procedure Notes (Signed)
 Procedure Name: Intubation Date/Time: 08/19/2024 10:06 AM  Performed by: Belvie Valri NOVAK, CRNAPre-anesthesia Checklist: Patient identified, Emergency Drugs available, Suction available and Patient being monitored Patient Re-evaluated:Patient Re-evaluated prior to induction Oxygen Delivery Method: Circle System Utilized Preoxygenation: Pre-oxygenation with 100% oxygen Induction Type: IV induction and Cricoid Pressure applied Ventilation: Mask ventilation without difficulty and Oral airway inserted - appropriate to patient size Laryngoscope Size: Mac and 3 Grade View: Grade III Tube type: Oral Tube size: 7.0 mm Number of attempts: 1 Airway Equipment and Method: Stylet and Oral airway Placement Confirmation: ETT inserted through vocal cords under direct vision, positive ETCO2 and breath sounds checked- equal and bilateral Secured at: 22 cm Tube secured with: Tape Dental Injury: Teeth and Oropharynx as per pre-operative assessment  Difficulty Due To: Difficult Airway- due to reduced neck mobility

## 2024-08-19 NOTE — Transfer of Care (Signed)
 Immediate Anesthesia Transfer of Care Note  Patient: [name]  Procedure(s) Performed: ARTHROPLASTY, HIP, TOTAL, ANTERIOR APPROACH (Left: Hip)  Patient Location: PACU  Anesthesia Type:General  Level of Consciousness: drowsy, patient cooperative, and responds to stimulation  Airway & Oxygen Therapy: Patient Spontanous Breathing and Patient connected to face mask oxygen  Post-op Assessment: Report given to RN and Post -op Vital signs reviewed and stable  Post vital signs: Reviewed and stable  Last Vitals:  Vitals Value Taken Time  BP 134/55 08/19/24 11:45  Temp    Pulse 85 08/19/24 11:46  Resp 18 08/19/24 11:46  SpO2 100 % 08/19/24 11:46  Vitals shown include unfiled device data.  Last Pain:  Vitals:   08/19/24 0857  TempSrc:   PainSc: 5          Complications: No notable events documented.

## 2024-08-19 NOTE — Discharge Instructions (Signed)

## 2024-08-20 DIAGNOSIS — M1612 Unilateral primary osteoarthritis, left hip: Secondary | ICD-10-CM | POA: Diagnosis not present

## 2024-08-20 LAB — GLUCOSE, CAPILLARY
Glucose-Capillary: 204 mg/dL — ABNORMAL HIGH (ref 70–99)
Glucose-Capillary: 156 mg/dL — ABNORMAL HIGH (ref 70–99)

## 2024-08-20 MED ORDER — DOXYCYCLINE HYCLATE 100 MG PO TABS
50.0000 mg | ORAL_TABLET | Freq: Every day | ORAL | Status: DC
  Administered 2024-08-20: 50 mg via ORAL
  Filled 2024-08-20: qty 1

## 2024-08-20 NOTE — Progress Notes (Signed)
 Subjective: 1 Day Post-Op Procedures (LRB): ARTHROPLASTY, HIP, TOTAL, ANTERIOR APPROACH (Left) Patient reports pain as mild.  Taking by mouth and voiding okay.  Should be ready to go home after therapy this morning.  Objective: Vital signs in last 24 hours: Temp:  [97.4 F (36.3 C)-98.5 F (36.9 C)] 98.5 F (36.9 C) (01/13 0538) Pulse Rate:  [75-85] 79 (01/13 0538) Resp:  [10-22] 18 (01/13 0538) BP: (109-171)/(53-91) 112/54 (01/13 0538) SpO2:  [90 %-100 %] 96 % (01/13 0538) Weight:  [86.2 kg] 86.2 kg (01/12 0857)  Intake/Output from previous day: 01/12 0701 - 01/13 0700 In: 4805.2 [P.O.:1060; I.V.:2815.2; IV Piggyback:930] Out: 2150 [Urine:2000; Blood:150] Intake/Output this shift: No intake/output data recorded.  No results for input(s): HGB in the last 72 hours. No results for input(s): WBC, RBC, HCT, PLT in the last 72 hours. No results for input(s): NA, K, CL, CO2, BUN, CREATININE, GLUCOSE, CALCIUM  in the last 72 hours. No results for input(s): LABPT, INR in the last 72 hours. Left hip exam: Neurovascular intact Sensation intact distally Intact pulses distally Dorsiflexion/Plantar flexion intact Incision: dressing C/D/I No cellulitis present Compartment soft   Assessment/Plan: 1 Day Post-Op Procedures (LRB): ARTHROPLASTY, HIP, TOTAL, ANTERIOR APPROACH (Left) Plan: Up with therapy weight-bear as tolerated on the left lower extremity without hip precautions. Aspirin  325 mg twice daily for DVT prophylaxis x 1 month postop. Her postop pain meds were sent into the pharmacy yesterday. She is set up for outpatient physical therapy and this has already been scheduled.  Follow-up with Dr. Yvone in 2 weeks.      [name] 08/20/2024, 8:19 AM

## 2024-08-20 NOTE — Progress Notes (Signed)
 Physical Therapy Treatment Patient Details Name: [name] MRN: [id number] DOB: [birth year] Today's Date: 08/20/2024   History of Present Illness 74 yo female presents to therapy s/p L THA, anterior approach on 08/20/2023 due to failure of conservative measures. Pt is currently L LE WBAT with anterior hip precautions-- avoid hyperextension, external rotation and abduction. 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  Pt ambulating 120' with RW safely and performed steps similar to home set up. BP stable this afternoon.  Pt has support, DME, and good understanding of HEP with outpt PT f/u scheduled. Pt demonstrates safe gait & transfers in order to return home from PT perspective once discharged by MD.  While in hospital, will continue to benefit from PT for skilled therapy to advance mobility and exercises.       If plan is discharge home, recommend the following: A little help with bathing/dressing/bathroom;A little help with walking and/or transfers;Assistance with cooking/housework;Help with stairs or ramp for entrance   Can travel by private vehicle        Equipment Recommendations  None recommended by PT    Recommendations for Other Services       Precautions / Restrictions Precautions Precautions: Fall Precaution/Restrictions Comments: Per ortho note today: NO hip precautions Restrictions LLE Weight Bearing Per Provider Order: Weight bearing as tolerated     Mobility  Bed Mobility Overal bed mobility: Needs Assistance Bed Mobility: Supine to Sit     Supine  to sit: Contact guard, Used rails     General bed mobility comments: Heavy use of rail and increased time but no assist; educated on using gait belt to assist L Leg off bed    Transfers Overall transfer level: Needs assistance Equipment used: Rolling walker (2 wheels) Transfers: Sit to/from Stand Sit to Stand: Supervision           General transfer comment: Good recall of hand placement; performed x 2    Ambulation/Gait Ambulation/Gait assistance: Supervision Gait Distance (Feet): 120 Feet Assistive device: Rolling walker (2 wheels) Gait Pattern/deviations: Step-through pattern Gait velocity: functional     General Gait Details: Good RW use, smooth gait pattern but at decreased speed; started CGA progressed to supervision   Stairs Stairs: Yes Stairs assistance: Contact guard assist Stair Management: Step to pattern, Forwards Number of Stairs: 2 General stair comments: L rail , CGA, pt recalled correct sequencing   Wheelchair Mobility     Tilt Bed    Modified Rankin (Stroke Patients Only)       Balance Overall balance assessment: Needs assistance Sitting-balance support: No upper extremity supported Sitting balance-Leahy Scale: Good     Standing balance support: Bilateral upper extremity supported, No upper extremity supported Standing balance-Leahy Scale: Fair Standing balance comment: steady with RW to ambulate; could static stand without UE support                            Communication    Cognition Arousal: Alert Behavior During Therapy: WFL for tasks assessed/performed   PT - Cognitive impairments: No apparent impairments  Cueing    Exercises Total Joint Exercises Ankle Circles/Pumps: AROM, Both, 10 reps, Supine Quad Sets: AROM, Both, 10 reps, Supine Heel Slides: AAROM, Left, 5 reps, Supine Hip ABduction/ADduction: AAROM, Left, 5 reps, Supine Long Arc Quad: AROM, Left, 5 reps,  Seated    General Comments General comments (skin integrity, edema, etc.): VSS - BP was 132/50 after ambulation Educated on safe ice use, no pivots, car transfers, and TED hose during day. Also, encouraged walking every 1-2 hours during day for 5-10 mins. Educated on HEP with focus on mobility the first week. Discussed doing exercises within pain control and if pain increasing could decreased ROM, reps, and stop exercises as needed. Encouraged to perform ankle pumps frequently for blood flow.      Pertinent Vitals/Pain Pain Assessment Pain Assessment: Faces Pain Score: 5  Faces Pain Scale: Hurts a little bit Pain Location: L hip Pain Descriptors / Indicators: Discomfort, Sore Pain Intervention(s): Limited activity within patient's tolerance, Monitored during session, Premedicated before session, Repositioned, Ice applied    Home Living                          Prior Function            PT Goals (current goals can now be found in the care plan section) Progress towards PT goals: Progressing toward goals    Frequency    7X/week      PT Plan      Co-evaluation              AM-PAC PT 6 Clicks Mobility   Outcome Measure  Help needed turning from your back to your side while in a flat bed without using bedrails?: A Little Help needed moving from lying on your back to sitting on the side of a flat bed without using bedrails?: A Little Help needed moving to and from a bed to a chair (including a wheelchair)?: A Little Help needed standing up from a chair using your arms (e.g., wheelchair or bedside chair)?: A Little Help needed to walk in hospital room?: A Little Help needed climbing 3-5 steps with a railing? : A Little 6 Click Score: 18    End of Session Equipment Utilized During Treatment: Gait belt Activity Tolerance: Patient tolerated treatment well Patient left: with call bell/phone within reach;in chair;with family/visitor present Nurse  Communication: Mobility status;Other (comment) (BP stable) PT Visit Diagnosis: Other abnormalities of gait and mobility (R26.89);Muscle weakness (generalized) (M62.81)     Time: 8642-8578 PT Time Calculation (min) (ACUTE ONLY): 24 min  Charges:    $Gait Training: 8-22 mins $Therapeutic Exercise: 8-22 mins PT General Charges $$ ACUTE PT VISIT: 1 Visit                     Benjiman, PT Acute Rehab Services [facility] [phone]    Benjiman VEAR Mulberry 08/20/2024, 2:27 PM

## 2024-08-20 NOTE — Progress Notes (Signed)
 Physical Therapy Treatment Patient Details Name: [name] MRN: [id number] DOB: 1950-10-04 Today's Date: 08/20/2024   History of Present Illness 74 yo female presents to therapy s/p L THA, anterior approach on 08/20/2023 due to failure of conservative measures. Pt is currently L LE WBAT with anterior hip precautions-- avoid hyperextension, external rotation and abduction. Pt PMH includes but is not limited to: arthritis, CKD III, mood disorder, CAD, HLD, GERD, depression, anxiety, cerebral thrombosis without infarct, DKA, PAC, adenoma of R colon, anemia, diastolic dysfunction, SOB, angina, dysphagia, DM II, SIRS, s/p TEE conversion, hypothyroidism, HTN, hepatic steatosis, LE DVT, CVA, RLS, t-spine compression fx, ACDF, back surgery, carpal tunnel release, hernia repair, B TKA    PT Comments  Pt progressing well with therapy.  Her pain was tolerable , she ambulated 100' , and performed stairs similar to home set up.  However, pt then became lightheaded and BP was 90/48, returned to supine and BP up to 110/53.  Notified nurse and encouraged pt to eat and drink well.  Will f/u in afternoon.  If BP stable and no further lightheadedness , expect that pt will clear therapy for return home.     If plan is discharge home, recommend the following: A little help with bathing/dressing/bathroom;A little help with walking and/or transfers;Assistance with cooking/housework;Help with stairs or ramp for entrance   Can travel by private vehicle        Equipment Recommendations  None recommended by PT    Recommendations for Other Services       Precautions / Restrictions Precautions Precautions: Fall Precaution/Restrictions Comments: Per ortho note today: NO hip precautions Restrictions LLE Weight Bearing Per Provider Order: Weight bearing as tolerated     Mobility  Bed Mobility Overal bed mobility: Needs Assistance Bed Mobility: Supine to Sit     Supine to sit: Contact guard, Used rails      General bed mobility comments: Heavy use of rail and increased time but no assist; educated on using gait belt to assist L Leg off bed    Transfers Overall transfer level: Needs assistance Equipment used: Rolling walker (2 wheels) Transfers: Sit to/from Stand Sit to Stand: Supervision           General transfer comment: cues for hand placement    Ambulation/Gait Ambulation/Gait assistance: Supervision, Contact guard assist Gait Distance (Feet): 100 Feet Assistive device: Rolling walker (2 wheels) Gait Pattern/deviations: Step-through pattern Gait velocity: functional     General Gait Details: Good RW use, smooth gait pattern but at decreased speed; started CGA progressed to supervision but after reports dizzy on stairs CGA for safety   Stairs Stairs: Yes Stairs assistance: Contact guard assist Stair Management: Step to pattern, Forwards Number of Stairs: 4 General stair comments: Started with bil rails progressed to single L rail.  Pt initially tolerating well but when finished with stairs reports hot and dizzy/lightheaded so returned to bed   Wheelchair Mobility     Tilt Bed    Modified Rankin (Stroke Patients Only)       Balance Overall balance assessment: Needs assistance Sitting-balance support: No upper extremity supported Sitting balance-Leahy Scale: Good     Standing balance support: Bilateral upper extremity supported, No upper extremity supported Standing balance-Leahy Scale: Fair Standing balance comment: steady with RW to ambulate; could static stand without UE support                            Communication  Cognition Arousal: Alert Behavior During Therapy: WFL for tasks assessed/performed   PT - Cognitive impairments: No apparent impairments                                Cueing    Exercises Total Joint Exercises Ankle Circles/Pumps: AROM, Both, 10 reps, Supine Quad Sets: AROM, Both, 10 reps, Supine Heel  Slides: AAROM, Left, 5 reps, Supine    General Comments General comments (skin integrity, edema, etc.): Pt reports lightheaded and hot after stairs.  Also noted with pallor.  Had pt return to sitting, retrieved dynamap and BP was 90/48.  Had pt return to supine.  Symptoms improving and BP up to 110/53.  Notified RN.  Encouraged pt to eat and drink well.      Pertinent Vitals/Pain Pain Assessment Pain Assessment: 0-10 Pain Score: 5  Pain Location: L hip Pain Descriptors / Indicators: Discomfort, Sore Pain Intervention(s): Limited activity within patient's tolerance, Monitored during session, Premedicated before session, Repositioned    Home Living                          Prior Function            PT Goals (current goals can now be found in the care plan section) Progress towards PT goals: Progressing toward goals    Frequency    7X/week      PT Plan      Co-evaluation              AM-PAC PT 6 Clicks Mobility   Outcome Measure  Help needed turning from your back to your side while in a flat bed without using bedrails?: A Little Help needed moving from lying on your back to sitting on the side of a flat bed without using bedrails?: A Little Help needed moving to and from a bed to a chair (including a wheelchair)?: A Little Help needed standing up from a chair using your arms (e.g., wheelchair or bedside chair)?: A Little Help needed to walk in hospital room?: A Little Help needed climbing 3-5 steps with a railing? : A Little 6 Click Score: 18    End of Session Equipment Utilized During Treatment: Gait belt Activity Tolerance: Patient tolerated treatment well Patient left: with call bell/phone within reach;with SCD's reapplied;in bed;with bed alarm set Nurse Communication: Mobility status;Other (comment) (orthostatic bp) PT Visit Diagnosis: Other abnormalities of gait and mobility (R26.89);Muscle weakness (generalized) (M62.81)     Time:  8944-8875 PT Time Calculation (min) (ACUTE ONLY): 29 min  Charges:    $Gait Training: 8-22 mins $Therapeutic Exercise: 8-22 mins PT General Charges $$ ACUTE PT VISIT: 1 Visit                     Benjiman, PT Acute Rehab Services [facility] [phone]    Benjiman VEAR Mulberry 08/20/2024, 12:09 PM

## 2024-08-20 NOTE — Discharge Summary (Signed)
 Patient ID: [name] MRN: [id number] DOB/AGE: [birth year] 74 y.o.  Admit date: 08/19/2024 Discharge date: 08/20/2024  Admission Diagnoses:  Principal Problem:   Arthritis of left hip Active Problems:   H/O total hip arthroplasty, left   Discharge Diagnoses:  Same  Past Medical History:  Diagnosis Date   Anxiety    Blood transfusion    CAD (coronary artery disease), native coronary artery    coronary CTA showed a coronary Ca score of 506, mild CAD with 25-49% mRCA and moderate CAD with 50-69% sequential mRCA, 25-49% inferior sub branch of D1 and 25-49% in pLCx.     Cervical spondylosis with myelopathy and radiculopathy    CVA (cerebral vascular accident) (HCC)    11/2017 - no weakness or paralysis   Depression    Diabetes mellitus    Type 1   Diastolic dysfunction    Diverticulitis    DVT (deep venous thrombosis) (HCC)    times 2 lower leg   Endometriosis    Fibromyalgia    Headache    History of kidney stones    Hyperlipidemia    Hypertension    Hypothyroidism    Mild aortic insufficiency    by echo 04/2013   Osteoarthritis    back and knee   Ovarian cyst    PONV (postoperative nausea and vomiting)    RLS (restless legs syndrome)    Thoracic compression fracture (HCC)    Villous adenoma of right colon 04/08/2016   Vitamin D  deficiency disease     Surgeries: Procedures: Left ARTHROPLASTY, HIP, TOTAL, ANTERIOR APPROACH on 08/19/2024   Consultants:   Discharged Condition: Improved  Hospital Course: [name] is an 74 y.o. female who was admitted 08/19/2024 for operative treatment ofArthritis of left hip. Patient has severe unremitting pain that affects sleep, daily activities, and work/hobbies. After pre-op clearance the patient was taken to the operating room on 08/19/2024 and underwent  Procedures: Left ARTHROPLASTY, HIP, TOTAL, ANTERIOR APPROACH.    Patient was given perioperative antibiotics:  Anti-infectives (From admission, onward)    Start      Dose/Rate Route Frequency Ordered Stop   08/20/24 1000  doxycycline  (VIBRAMYCIN ) 50 MG capsule 50 mg  Status:  Discontinued        50 mg Oral Daily 08/19/24 1252 08/20/24 0247   08/20/24 1000  doxycycline  (VIBRA -TABS) tablet 50 mg        50 mg Oral Daily 08/20/24 0247     08/19/24 0815  levofloxacin  (LEVAQUIN ) IVPB 500 mg        500 mg 100 mL/hr over 60 Minutes Intravenous On call to O.R. 08/19/24 0808 08/19/24 1012   08/19/24 0815  vancomycin  (VANCOCIN ) IVPB 1000 mg/200 mL premix        1,000 mg 200 mL/hr over 60 Minutes Intravenous On call to O.R. 08/19/24 9191 08/19/24 1253        Patient was given sequential compression devices, early ambulation, and chemoprophylaxis to prevent DVT.  Inpatient Morphine  Milligram Equivalents Per Day 1/12 - 1/13   Values displayed are in units of MME/Day    Order Start / End Date Yesterday Today    oxyCODONE  (Oxy IR/ROXICODONE ) immediate release tablet 5 mg 1/12 - 1/12 0 of Unknown --    oxyCODONE  (ROXICODONE ) 5 MG/5ML solution 5 mg 1/12 - 1/12 0 of Unknown --      Group total: 0 of Unknown     HYDROmorphone  (DILAUDID ) injection 0.25-0.5 mg 1/12 - 1/12 0 of 40-80 --  meperidine  (DEMEROL ) injection 6.25-12.5 mg 1/12 - 1/12 0 of 7.5-15 --    fentaNYL  (SUBLIMAZE ) injection 1/12 - 1/12 *45 of 45 --    traMADol  (ULTRAM ) tablet 50 mg 1/12 - No end date 10 of 10 10 of 20    oxyCODONE  (Oxy IR/ROXICODONE ) immediate release tablet 5-10 mg 1/12 - No end date 30 of 22.5-45 0 of 45-90    oxyCODONE  (Oxy IR/ROXICODONE ) immediate release tablet 10-15 mg 1/12 - No end date 0 of 45-67.5 0 of 90-135    fentaNYL  (SUBLIMAZE ) injection 25-50 mcg 1/12 - 1/12 30 of 45-90 --    Daily Totals  * 115 of Unknown (at least 215-352.5) 10 of 155-245  *One-Step medication  Calculation Errors     Order Type Date Details   oxyCODONE  (Oxy IR/ROXICODONE ) immediate release tablet 5 mg Ordered Dose -- Insufficient frequency information   oxyCODONE  (ROXICODONE ) 5 MG/5ML solution 5 mg  Ordered Dose -- Insufficient frequency information            Patient benefited maximally from hospital stay and there were no complications.    Recent vital signs: Patient Vitals for the past 24 hrs:  BP Temp Temp src Pulse Resp SpO2 Height Weight  08/20/24 0538 (!) 112/54 98.5 F (36.9 C) Oral 79 18 96 % -- --  08/20/24 0023 (!) 135/59 98.4 F (36.9 C) Oral 84 16 94 % -- --  08/19/24 2156 (!) 129/57 98.4 F (36.9 C) Oral 77 14 95 % -- --  08/19/24 1551 (!) 109/57 (!) 97.4 F (36.3 C) Oral 75 16 97 % -- --  08/19/24 1300 (!) 117/91 97.7 F (36.5 C) Oral 76 15 100 % -- --  08/19/24 1245 (!) 136/54 -- -- 79 16 100 % -- --  08/19/24 1230 (!) 130/53 -- -- 78 20 90 % -- --  08/19/24 1215 (!) 127/58 -- -- 78 10 100 % -- --  08/19/24 1210 (!) 132/55 -- -- 80 18 99 % -- --  08/19/24 1200 (!) 124/57 -- -- 82 (!) 22 98 % -- --  08/19/24 1145 (!) 134/55 (!) 97.5 F (36.4 C) -- 85 19 99 % -- --  08/19/24 0857 -- -- -- -- -- -- 5' 2 (1.575 m) 86.2 kg  08/19/24 0850 (!) 171/91 98.2 F (36.8 C) Oral 83 16 98 % -- --     Recent laboratory studies: No results for input(s): WBC, HGB, HCT, PLT, NA, K, CL, CO2, BUN, CREATININE, GLUCOSE, INR, CALCIUM  in the last 72 hours.  Invalid input(s): PT, 2   Discharge Medications:   Allergies as of 08/20/2024       Reactions   Dilaudid  [hydromorphone  Hcl] Other (See Comments)   Hallucinations/ argumentative, goes beserk   Heparin  Other (See Comments)   HIT ab positive  (SRA negative 11/25/17)    Ambien  [zolpidem  Tartrate] Other (See Comments)   Up sleep walking and eating   Belsomra [suvorexant]    ineffective Other reaction(s): Other ineffective   Codeine Nausea And Vomiting   Morphine  And Codeine Other (See Comments)   Flushing and feeling hot Tolerates with Diphenhydramine    Penicillins Rash, Other (See Comments)   Caused Headaches also Has patient had a PCN reaction causing immediate rash,  facial/tongue/throat swelling, SOB or lightheadedness with hypotension: No Has patient had a PCN reaction causing severe rash involving mucus membranes or skin necrosis: No Has patient had a PCN reaction that required hospitalization No Has patient had a PCN reaction occurring within  the last 10 years: No If all of the above answers are NO, then may proceed with Cephalosporin use.   Statins Other (See Comments)   Muscle weakness, cramps and aching all over body   Ceftin [cefuroxime Axetil] Other (See Comments)   Stomach pain    Elavil [amitriptyline] Other (See Comments)   Loopy feeling   Lovaza  [omega-3-acid  Ethyl Esters (fish)] Other (See Comments)   Stomach pain    Lunesta [eszopiclone] Other (See Comments)   Causes dizziness   Lyrica [pregabalin] Nausea Only   Metformin  And Related Nausea Only        Medication List     PAUSE taking these medications    aspirin  EC 81 MG tablet Wait to take this until: September 16, 2024 Take 81 mg by mouth daily. You also have another medication with the same name that you may need to continue taking.       STOP taking these medications    traMADol  50 MG tablet Commonly known as: ULTRAM        TAKE these medications    acetaminophen  500 MG tablet Commonly known as: TYLENOL  Take 1,000 mg by mouth every 8 (eight) hours as needed for mild pain or headache.   amLODipine  2.5 MG tablet Commonly known as: NORVASC  Take 2 tablets (5 mg total) by mouth daily. What changed: how much to take   aspirin  EC 325 MG tablet Take 1 tablet (325 mg total) by mouth 2 (two) times daily. What changed: Another medication with the same name was paused. Ask your nurse or doctor if you should take this medication.   cetirizine 10 MG tablet Commonly known as: ZYRTEC Take 10 mg by mouth daily.   Clobetasol  Propionate 0.05 % external spray Commonly known as: TEMOVATE  Apply 1 spray topically at bedtime as needed (rosacea).   cyanocobalamin  1000  MCG tablet Commonly known as: VITAMIN B12 Take 1,000 mcg by mouth in the morning.   docusate sodium  100 MG capsule Commonly known as: Colace Take 1 capsule (100 mg total) by mouth 2 (two) times daily.   doxycycline  50 MG capsule Commonly known as: VIBRAMYCIN  Take 50 mg by mouth daily.   escitalopram  5 MG tablet Commonly known as: LEXAPRO  Take 5 mg by mouth daily as needed (anxiety).   Fluocinolone Acetonide 0.01 % Oil Place 5 drops in ear(s) daily as needed (rosacea).   gabapentin  300 MG capsule Commonly known as: NEURONTIN  Take 900 mg by mouth at bedtime.   HumaLOG  100 UNIT/ML injection Generic drug: insulin  lispro Inject 0-110 Units into the skin continuous as needed for high blood sugar (via pump).   imipramine  50 MG tablet Commonly known as: Tofranil  Take 1 tablet (50 mg total) by mouth daily. What changed: when to take this   irbesartan -hydrochlorothiazide  300-12.5 MG tablet Commonly known as: AVALIDE Take 1 tablet by mouth in the morning.   levothyroxine  150 MCG tablet Commonly known as: SYNTHROID  Take 150 mcg by mouth daily before breakfast.   LORazepam  0.5 MG tablet Commonly known as: ATIVAN  Take 0.5-1 mg by mouth every 8 (eight) hours as needed for anxiety.   Magnesium  250 MG Tabs Take 250 mg by mouth in the morning.   methocarbamol  750 MG tablet Commonly known as: ROBAXIN  Take 750 mg by mouth every 8 (eight) hours as needed for muscle spasms.   metoprolol  succinate 25 MG 24 hr tablet Commonly known as: TOPROL -XL TAKE 1 TABLET (25 MG TOTAL) BY MOUTH DAILY.   omeprazole 40 MG capsule Commonly known  as: PRILOSEC Take 40 mg by mouth daily.   oxyCODONE -acetaminophen  5-325 MG tablet Commonly known as: PERCOCET/ROXICET Take 1 tablet by mouth every 4 (four) hours as needed for severe pain (pain score 7-10).   Potassium 99 MG Tabs Take 99 mg by mouth in the morning.   PROBIOTIC PO Take 1 capsule by mouth in the morning.   promethazine  25 MG  tablet Commonly known as: PHENERGAN  Take 25 mg by mouth every 8 (eight) hours as needed for nausea or vomiting.   rosuvastatin  20 MG tablet Commonly known as: CRESTOR  Take 20 mg by mouth in the morning.   tiZANidine  4 MG tablet Commonly known as: ZANAFLEX  Take 1 tablet (4 mg total) by mouth every 6 (six) hours as needed for muscle spasms.   Vitamin D3 125 MCG (5000 UT) Tabs Take 5,000 Units by mouth in the morning.   zinc  sulfate (50mg  elemental zinc ) 220 (50 Zn) MG capsule Take 220 mg by mouth in the morning.               Durable Medical Equipment  (From admission, onward)           Start     Ordered   08/19/24 1253  DME Walker rolling  Once       Question:  Patient needs a walker to treat with the following condition  Answer:  Status post total hip replacement, left   08/19/24 1252   08/19/24 1253  DME 3 n 1  Once        08/19/24 1252              Discharge Care Instructions  (From admission, onward)           Start     Ordered   08/20/24 0000  Weight bearing as tolerated       Question Answer Comment  Laterality left   Extremity Lower      08/20/24 0822            Diagnostic Studies: DG HIP UNILAT WITH PELVIS 1V LEFT Result Date: 08/19/2024 EXAM: FLUOROSCOPIC IMAGING TECHNIQUE: Fluoroscopy was provided by the radiology department for procedure. Radiologist was not present during examination. RADIATION DOSE INDEX: Fluoroscopy time 15 seconds. Reference Air Kerma: 2 mGy. COMPARISON: None available. CLINICAL HISTORY: [id number] Surgery, elective [id number] Surgery, elective [phone]. FINDINGS: Intraoperative fluoroscopic imaging was performed. Medical history is left hip replacement. Fluoroscopy time 15 seconds. Fluoroscopy dose 2 mGy. IMPRESSION: 1. Intraoperative fluoroscopic imaging as above. Please refer to the operative report for full details. Electronically signed by: Oneil Devonshire MD MD 08/19/2024 11:18 PM EST RP Workstation: [id number] [id number] C-Arm  1-60 Min-No Report Result Date: 08/19/2024 Fluoroscopy was utilized by the requesting physician.  No radiographic interpretation.   DG C-Arm 1-60 Min-No Report Result Date: 08/19/2024 Fluoroscopy was utilized by the requesting physician.  No radiographic interpretation.    Disposition: Discharge disposition: 01-Home or Self Care       Discharge Instructions     Call MD / Call 911   Complete by: As directed    If you experience chest pain or shortness of breath, CALL 911 and be transported to the hospital emergency room.  If you develope a fever above 101 F, pus (white drainage) or increased drainage or redness at the wound, or calf pain, call your surgeon's office.   Constipation Prevention   Complete by: As directed    Drink plenty of fluids.  Prune juice may be helpful.  You may use a stool softener, such as Colace (over the counter) 100 mg twice a day.  Use MiraLax  (over the counter) for constipation as needed.   Diet general   Complete by: As directed    Increase activity slowly as tolerated   Complete by: As directed    Post-operative opioid taper instructions:   Complete by: As directed    POST-OPERATIVE OPIOID TAPER INSTRUCTIONS: It is important to wean off of your opioid medication as soon as possible. If you do not need pain medication after your surgery it is ok to stop day one. Opioids include: Codeine, Hydrocodone (Norco, Vicodin), Oxycodone (Percocet, oxycontin ) and hydromorphone  amongst others.  Long term and even short term use of opiods can cause: Increased pain response Dependence Constipation Depression Respiratory depression And more.  Withdrawal symptoms can include Flu like symptoms Nausea, vomiting And more Techniques to manage these symptoms Hydrate well Eat regular healthy meals Stay active Use relaxation techniques(deep breathing, meditating, yoga) Do Not substitute Alcohol to help with tapering If you have been on opioids for less than two  weeks and do not have pain than it is ok to stop all together.  Plan to wean off of opioids This plan should start within one week post op of your joint replacement. Maintain the same interval or time between taking each dose and first decrease the dose.  Cut the total daily intake of opioids by one tablet each day Next start to increase the time between doses. The last dose that should be eliminated is the evening dose.      Weight bearing as tolerated   Complete by: As directed    Laterality: left   Extremity: Lower        Follow-up Information     Yvone Rush, MD Follow up in 2 week(s).   Specialty: Orthopedic Surgery Contact information: [address] [place] [address] [phone]                  Signed: Lynwood KANDICE Reining 08/20/2024, 8:22 AM

## 2024-08-20 NOTE — Care Management Obs Status (Signed)
 MEDICARE OBSERVATION STATUS NOTIFICATION   Patient Details  Name: [name] MRN: [id number] Date of Birth: 06/13/1951   Medicare Observation Status Notification Given:  Yes    Alfonse JONELLE Rex, RN 08/20/2024, 10:38 AM

## 2024-08-21 ENCOUNTER — Encounter (HOSPITAL_COMMUNITY): Payer: Self-pay | Admitting: Orthopedic Surgery

## 2024-08-25 ENCOUNTER — Inpatient Hospital Stay (HOSPITAL_BASED_OUTPATIENT_CLINIC_OR_DEPARTMENT_OTHER)
Admission: EM | Admit: 2024-08-25 | Discharge: 2024-08-27 | DRG: 638 | Disposition: A | Discharge status: Home / Self Care | Attending: Internal Medicine | Admitting: Internal Medicine

## 2024-08-25 ENCOUNTER — Other Ambulatory Visit: Payer: Self-pay

## 2024-08-25 ENCOUNTER — Encounter (HOSPITAL_BASED_OUTPATIENT_CLINIC_OR_DEPARTMENT_OTHER): Payer: Self-pay

## 2024-08-25 ENCOUNTER — Emergency Department (HOSPITAL_BASED_OUTPATIENT_CLINIC_OR_DEPARTMENT_OTHER)

## 2024-08-25 DIAGNOSIS — E669 Obesity, unspecified: Secondary | ICD-10-CM | POA: Diagnosis present

## 2024-08-25 DIAGNOSIS — E1010 Type 1 diabetes mellitus with ketoacidosis without coma: Principal | ICD-10-CM | POA: Diagnosis present

## 2024-08-25 DIAGNOSIS — I130 Hypertensive heart and chronic kidney disease with heart failure and stage 1 through stage 4 chronic kidney disease, or unspecified chronic kidney disease: Secondary | ICD-10-CM | POA: Diagnosis present

## 2024-08-25 DIAGNOSIS — Z79899 Other long term (current) drug therapy: Secondary | ICD-10-CM

## 2024-08-25 DIAGNOSIS — I5032 Chronic diastolic (congestive) heart failure: Secondary | ICD-10-CM | POA: Diagnosis present

## 2024-08-25 DIAGNOSIS — Z833 Family history of diabetes mellitus: Secondary | ICD-10-CM | POA: Diagnosis not present

## 2024-08-25 DIAGNOSIS — D500 Iron deficiency anemia secondary to blood loss (chronic): Secondary | ICD-10-CM | POA: Diagnosis not present

## 2024-08-25 DIAGNOSIS — Z79890 Hormone replacement therapy (postmenopausal): Secondary | ICD-10-CM | POA: Diagnosis not present

## 2024-08-25 DIAGNOSIS — Z8673 Personal history of transient ischemic attack (TIA), and cerebral infarction without residual deficits: Secondary | ICD-10-CM

## 2024-08-25 DIAGNOSIS — Z7982 Long term (current) use of aspirin: Secondary | ICD-10-CM

## 2024-08-25 DIAGNOSIS — E785 Hyperlipidemia, unspecified: Secondary | ICD-10-CM | POA: Diagnosis present

## 2024-08-25 DIAGNOSIS — Z801 Family history of malignant neoplasm of trachea, bronchus and lung: Secondary | ICD-10-CM | POA: Diagnosis not present

## 2024-08-25 DIAGNOSIS — K219 Gastro-esophageal reflux disease without esophagitis: Secondary | ICD-10-CM | POA: Diagnosis present

## 2024-08-25 DIAGNOSIS — I2510 Atherosclerotic heart disease of native coronary artery without angina pectoris: Secondary | ICD-10-CM | POA: Diagnosis present

## 2024-08-25 DIAGNOSIS — I5189 Other ill-defined heart diseases: Secondary | ICD-10-CM

## 2024-08-25 DIAGNOSIS — D649 Anemia, unspecified: Secondary | ICD-10-CM | POA: Diagnosis present

## 2024-08-25 DIAGNOSIS — Z881 Allergy status to other antibiotic agents status: Secondary | ICD-10-CM

## 2024-08-25 DIAGNOSIS — Z8419 Family history of other disorders of kidney and ureter: Secondary | ICD-10-CM

## 2024-08-25 DIAGNOSIS — Z6834 Body mass index (BMI) 34.0-34.9, adult: Secondary | ICD-10-CM

## 2024-08-25 DIAGNOSIS — E039 Hypothyroidism, unspecified: Secondary | ICD-10-CM | POA: Diagnosis present

## 2024-08-25 DIAGNOSIS — Z885 Allergy status to narcotic agent status: Secondary | ICD-10-CM

## 2024-08-25 DIAGNOSIS — Z96653 Presence of artificial knee joint, bilateral: Secondary | ICD-10-CM | POA: Diagnosis present

## 2024-08-25 DIAGNOSIS — N1831 Chronic kidney disease, stage 3a: Secondary | ICD-10-CM | POA: Diagnosis present

## 2024-08-25 DIAGNOSIS — M797 Fibromyalgia: Secondary | ICD-10-CM | POA: Diagnosis present

## 2024-08-25 DIAGNOSIS — Z96642 Presence of left artificial hip joint: Secondary | ICD-10-CM | POA: Diagnosis present

## 2024-08-25 DIAGNOSIS — E1111 Type 2 diabetes mellitus with ketoacidosis with coma: Secondary | ICD-10-CM | POA: Diagnosis not present

## 2024-08-25 DIAGNOSIS — Z803 Family history of malignant neoplasm of breast: Secondary | ICD-10-CM

## 2024-08-25 DIAGNOSIS — Z86718 Personal history of other venous thrombosis and embolism: Secondary | ICD-10-CM

## 2024-08-25 DIAGNOSIS — Z86711 Personal history of pulmonary embolism: Secondary | ICD-10-CM

## 2024-08-25 DIAGNOSIS — Z8249 Family history of ischemic heart disease and other diseases of the circulatory system: Secondary | ICD-10-CM

## 2024-08-25 DIAGNOSIS — G2581 Restless legs syndrome: Secondary | ICD-10-CM | POA: Diagnosis present

## 2024-08-25 DIAGNOSIS — D631 Anemia in chronic kidney disease: Secondary | ICD-10-CM | POA: Diagnosis present

## 2024-08-25 DIAGNOSIS — Z888 Allergy status to other drugs, medicaments and biological substances status: Secondary | ICD-10-CM

## 2024-08-25 DIAGNOSIS — Z981 Arthrodesis status: Secondary | ICD-10-CM | POA: Diagnosis not present

## 2024-08-25 DIAGNOSIS — E1110 Type 2 diabetes mellitus with ketoacidosis without coma: Principal | ICD-10-CM | POA: Diagnosis present

## 2024-08-25 DIAGNOSIS — Z9049 Acquired absence of other specified parts of digestive tract: Secondary | ICD-10-CM

## 2024-08-25 DIAGNOSIS — Z880 Allergy status to penicillin: Secondary | ICD-10-CM | POA: Diagnosis not present

## 2024-08-25 DIAGNOSIS — Z9641 Presence of insulin pump (external) (internal): Secondary | ICD-10-CM | POA: Diagnosis present

## 2024-08-25 DIAGNOSIS — E1022 Type 1 diabetes mellitus with diabetic chronic kidney disease: Secondary | ICD-10-CM | POA: Diagnosis present

## 2024-08-25 DIAGNOSIS — E871 Hypo-osmolality and hyponatremia: Secondary | ICD-10-CM | POA: Diagnosis present

## 2024-08-25 LAB — URINALYSIS, ROUTINE W REFLEX MICROSCOPIC
Bacteria, UA: NONE SEEN
Bilirubin Urine: NEGATIVE
Glucose, UA: >1000 mg/dL — AB
Hgb urine dipstick: NEGATIVE
Ketones, ur: 40 mg/dL — AB
Nitrite: NEGATIVE
Protein, ur: NEGATIVE mg/dL
Specific Gravity, Urine: 1.023 (ref 1.005–1.030)
pH: 5 (ref 5.0–8.0)

## 2024-08-25 LAB — CBC WITH DIFFERENTIAL/PLATELET
Abs Immature Granulocytes: 0.02 K/uL (ref 0.00–0.07)
Basophils Absolute: 0 K/uL (ref 0.0–0.1)
Basophils Relative: 1 %
Eosinophils Absolute: 0.2 K/uL (ref 0.0–0.5)
Eosinophils Relative: 3 %
HCT: 32.7 % — ABNORMAL LOW (ref 36.0–46.0)
Hemoglobin: 10.7 g/dL — ABNORMAL LOW (ref 12.0–15.0)
Immature Granulocytes: 0 %
Lymphocytes Relative: 22 %
Lymphs Abs: 1.4 K/uL (ref 0.7–4.0)
MCH: 26.6 pg (ref 26.0–34.0)
MCHC: 32.7 g/dL (ref 30.0–36.0)
MCV: 81.1 fL (ref 80.0–100.0)
Monocytes Absolute: 0.5 K/uL (ref 0.1–1.0)
Monocytes Relative: 8 %
Neutro Abs: 4.2 K/uL (ref 1.7–7.7)
Neutrophils Relative %: 66 %
Platelets: 262 K/uL (ref 150–400)
RBC: 4.03 MIL/uL (ref 3.87–5.11)
RDW: 13.5 % (ref 11.5–15.5)
WBC: 6.3 K/uL (ref 4.0–10.5)
nRBC: 0 % (ref 0.0–0.2)

## 2024-08-25 LAB — TYPE AND SCREEN
ABO/RH(D): O POS
Antibody Screen: NEGATIVE

## 2024-08-25 LAB — BASIC METABOLIC PANEL WITH GFR
Anion gap: 22 — ABNORMAL HIGH (ref 5–15)
Anion gap: 14 (ref 5–15)
Anion gap: 11 (ref 5–15)
BUN: 30 mg/dL — ABNORMAL HIGH (ref 8–23)
BUN: 27 mg/dL — ABNORMAL HIGH (ref 8–23)
BUN: 23 mg/dL (ref 8–23)
CO2: 19 mmol/L — ABNORMAL LOW (ref 22–32)
CO2: 23 mmol/L (ref 22–32)
CO2: 25 mmol/L (ref 22–32)
Calcium: 10.1 mg/dL (ref 8.9–10.3)
Calcium: 9.2 mg/dL (ref 8.9–10.3)
Calcium: 9.2 mg/dL (ref 8.9–10.3)
Chloride: 91 mmol/L — ABNORMAL LOW (ref 98–111)
Chloride: 97 mmol/L — ABNORMAL LOW (ref 98–111)
Chloride: 100 mmol/L (ref 98–111)
Creatinine, Ser: 1.39 mg/dL — ABNORMAL HIGH (ref 0.44–1.00)
Creatinine, Ser: 1.17 mg/dL — ABNORMAL HIGH (ref 0.44–1.00)
Creatinine, Ser: 1.02 mg/dL — ABNORMAL HIGH (ref 0.44–1.00)
GFR, Estimated: 40 mL/min — ABNORMAL LOW
GFR, Estimated: 49 mL/min — ABNORMAL LOW
GFR, Estimated: 57 mL/min — ABNORMAL LOW
Glucose, Bld: 435 mg/dL — ABNORMAL HIGH (ref 70–99)
Glucose, Bld: 264 mg/dL — ABNORMAL HIGH (ref 70–99)
Glucose, Bld: 173 mg/dL — ABNORMAL HIGH (ref 70–99)
Potassium: 4.4 mmol/L (ref 3.5–5.1)
Potassium: 4 mmol/L (ref 3.5–5.1)
Potassium: 3.8 mmol/L (ref 3.5–5.1)
Sodium: 132 mmol/L — ABNORMAL LOW (ref 135–145)
Sodium: 134 mmol/L — ABNORMAL LOW (ref 135–145)
Sodium: 137 mmol/L (ref 135–145)

## 2024-08-25 LAB — HEPATIC FUNCTION PANEL
ALT: 9 U/L (ref 0–44)
AST: 15 U/L (ref 15–41)
Albumin: 3.3 g/dL — ABNORMAL LOW (ref 3.5–5.0)
Alkaline Phosphatase: 92 U/L (ref 38–126)
Bilirubin, Direct: 0.3 mg/dL — ABNORMAL HIGH (ref 0.0–0.2)
Indirect Bilirubin: 0.2 mg/dL — ABNORMAL LOW (ref 0.3–0.9)
Total Bilirubin: 0.5 mg/dL (ref 0.0–1.2)
Total Protein: 6.1 g/dL — ABNORMAL LOW (ref 6.5–8.1)

## 2024-08-25 LAB — CBC
HCT: 32.4 % — ABNORMAL LOW (ref 36.0–46.0)
Hemoglobin: 10.9 g/dL — ABNORMAL LOW (ref 12.0–15.0)
MCH: 27 pg (ref 26.0–34.0)
MCHC: 33.6 g/dL (ref 30.0–36.0)
MCV: 80.4 fL (ref 80.0–100.0)
Platelets: 319 K/uL (ref 150–400)
RBC: 4.03 MIL/uL (ref 3.87–5.11)
RDW: 13.5 % (ref 11.5–15.5)
WBC: 7.6 K/uL (ref 4.0–10.5)
nRBC: 0 % (ref 0.0–0.2)

## 2024-08-25 LAB — GLUCOSE, CAPILLARY
Glucose-Capillary: 144 mg/dL — ABNORMAL HIGH (ref 70–99)
Glucose-Capillary: 157 mg/dL — ABNORMAL HIGH (ref 70–99)
Glucose-Capillary: 159 mg/dL — ABNORMAL HIGH (ref 70–99)
Glucose-Capillary: 160 mg/dL — ABNORMAL HIGH (ref 70–99)
Glucose-Capillary: 168 mg/dL — ABNORMAL HIGH (ref 70–99)
Glucose-Capillary: 205 mg/dL — ABNORMAL HIGH (ref 70–99)

## 2024-08-25 LAB — CBG MONITORING, ED
Glucose-Capillary: 420 mg/dL — ABNORMAL HIGH (ref 70–99)
Glucose-Capillary: 356 mg/dL — ABNORMAL HIGH (ref 70–99)
Glucose-Capillary: 211 mg/dL — ABNORMAL HIGH (ref 70–99)
Glucose-Capillary: 289 mg/dL — ABNORMAL HIGH (ref 70–99)

## 2024-08-25 LAB — MAGNESIUM: Magnesium: 1.8 mg/dL (ref 1.7–2.4)

## 2024-08-25 LAB — BETA-HYDROXYBUTYRIC ACID
Beta-Hydroxybutyric Acid: 4.52 mmol/L — ABNORMAL HIGH (ref 0.05–0.27)
Beta-Hydroxybutyric Acid: 0.92 mmol/L — ABNORMAL HIGH (ref 0.05–0.27)

## 2024-08-25 LAB — LACTIC ACID, PLASMA
Lactic Acid, Venous: 1.9 mmol/L (ref 0.5–1.9)
Lactic Acid, Venous: 1.8 mmol/L (ref 0.5–1.9)

## 2024-08-25 LAB — PHOSPHORUS: Phosphorus: 3 mg/dL (ref 2.5–4.6)

## 2024-08-25 MED ORDER — SODIUM CHLORIDE 0.9 % IV SOLN: INTRAVENOUS | Status: DC

## 2024-08-25 MED ORDER — ROSUVASTATIN CALCIUM 20 MG PO TABS
20.0000 mg | ORAL_TABLET | Freq: Every morning | ORAL | Status: DC
  Administered 2024-08-26 – 2024-08-27 (×2): 20 mg via ORAL
  Filled 2024-08-25 (×2): qty 2

## 2024-08-25 MED ORDER — INSULIN ASPART 100 UNIT/ML IJ SOLN
0.0000 [IU] | Freq: Every day | INTRAMUSCULAR | Status: DC
  Administered 2024-08-26: 2 [IU] via SUBCUTANEOUS
  Filled 2024-08-25: qty 2

## 2024-08-25 MED ORDER — INSULIN REGULAR(HUMAN) IN NACL 100-0.9 UT/100ML-% IV SOLN: INTRAVENOUS | Status: DC

## 2024-08-25 MED ORDER — LACTATED RINGERS IV SOLN: INTRAVENOUS | Status: DC

## 2024-08-25 MED ORDER — MORPHINE SULFATE (PF) 4 MG/ML IV SOLN
4.0000 mg | Freq: Once | INTRAVENOUS | Status: AC
  Administered 2024-08-25: 4 mg via INTRAVENOUS
  Filled 2024-08-25: qty 1

## 2024-08-25 MED ORDER — SODIUM CHLORIDE 0.9 % IV BOLUS
1000.0000 mL | Freq: Once | INTRAVENOUS | Status: AC
  Administered 2024-08-25: 1000 mL via INTRAVENOUS

## 2024-08-25 MED ORDER — METOPROLOL SUCCINATE ER 25 MG PO TB24
25.0000 mg | ORAL_TABLET | Freq: Every day | ORAL | Status: DC
  Administered 2024-08-26 – 2024-08-27 (×2): 25 mg via ORAL
  Filled 2024-08-25 (×2): qty 1

## 2024-08-25 MED ORDER — ASPIRIN 325 MG PO TBEC: 325.0000 mg | DELAYED_RELEASE_TABLET | Freq: Two times a day (BID) | ORAL | Status: DC

## 2024-08-25 MED ORDER — INSULIN GLARGINE 100 UNIT/ML ~~LOC~~ SOLN: 10.0000 [IU] | Freq: Every day | SUBCUTANEOUS | Status: DC

## 2024-08-25 MED ORDER — LACTATED RINGERS IV BOLUS
1000.0000 mL | Freq: Once | INTRAVENOUS | Status: AC
  Administered 2024-08-25: 1000 mL via INTRAVENOUS

## 2024-08-25 MED ORDER — AMLODIPINE BESYLATE 5 MG PO TABS
2.5000 mg | ORAL_TABLET | Freq: Every day | ORAL | Status: DC
  Administered 2024-08-26 – 2024-08-27 (×2): 2.5 mg via ORAL
  Filled 2024-08-25 (×2): qty 1

## 2024-08-25 MED ORDER — DEXTROSE-SODIUM CHLORIDE 5-0.45 % IV SOLN: INTRAVENOUS | Status: DC

## 2024-08-25 MED ORDER — INSULIN ASPART 100 UNIT/ML IJ SOLN
0.0000 [IU] | Freq: Three times a day (TID) | INTRAMUSCULAR | Status: DC
  Administered 2024-08-26: 5 [IU] via SUBCUTANEOUS
  Administered 2024-08-26: 11 [IU] via SUBCUTANEOUS
  Administered 2024-08-26: 8 [IU] via SUBCUTANEOUS
  Administered 2024-08-27 (×2): 5 [IU] via SUBCUTANEOUS
  Filled 2024-08-25: qty 8
  Filled 2024-08-25: qty 11
  Filled 2024-08-25 (×3): qty 5

## 2024-08-25 MED ORDER — DEXTROSE 50 % IV SOLN: 0.0000 mL | INTRAVENOUS | Status: DC | PRN

## 2024-08-25 MED ORDER — LEVOTHYROXINE SODIUM 75 MCG PO TABS
150.0000 ug | ORAL_TABLET | Freq: Every day | ORAL | Status: DC
  Administered 2024-08-26 – 2024-08-27 (×2): 150 ug via ORAL
  Filled 2024-08-25: qty 2
  Filled 2024-08-25: qty 1

## 2024-08-25 MED ORDER — DOCUSATE SODIUM 100 MG PO CAPS
100.0000 mg | ORAL_CAPSULE | Freq: Two times a day (BID) | ORAL | Status: DC
  Administered 2024-08-26: 100 mg via ORAL
  Filled 2024-08-25 (×2): qty 1

## 2024-08-25 MED ORDER — PANTOPRAZOLE SODIUM 40 MG PO TBEC
40.0000 mg | DELAYED_RELEASE_TABLET | Freq: Every day | ORAL | Status: DC
  Administered 2024-08-26 – 2024-08-27 (×2): 40 mg via ORAL
  Filled 2024-08-25 (×2): qty 1

## 2024-08-25 MED ORDER — DIPHENHYDRAMINE HCL 50 MG/ML IJ SOLN
12.5000 mg | Freq: Four times a day (QID) | INTRAMUSCULAR | Status: DC | PRN
  Administered 2024-08-25 – 2024-08-27 (×5): 12.5 mg via INTRAVENOUS
  Filled 2024-08-25 (×5): qty 1

## 2024-08-25 MED ORDER — DIPHENHYDRAMINE HCL 50 MG/ML IJ SOLN
25.0000 mg | Freq: Once | INTRAMUSCULAR | Status: AC
  Administered 2024-08-25: 25 mg via INTRAVENOUS
  Filled 2024-08-25: qty 1

## 2024-08-25 MED ORDER — POTASSIUM CHLORIDE 10 MEQ/100ML IV SOLN
10.0000 meq | INTRAVENOUS | Status: AC
  Administered 2024-08-25 (×2): 10 meq via INTRAVENOUS
  Filled 2024-08-25 (×2): qty 100

## 2024-08-25 MED ORDER — MORPHINE SULFATE (PF) 2 MG/ML IV SOLN
2.0000 mg | INTRAVENOUS | Status: DC | PRN
  Administered 2024-08-25 – 2024-08-27 (×3): 2 mg via INTRAVENOUS
  Filled 2024-08-25 (×3): qty 1

## 2024-08-25 MED ORDER — ASPIRIN 81 MG PO TBEC
243.0000 mg | DELAYED_RELEASE_TABLET | Freq: Two times a day (BID) | ORAL | Status: DC
  Administered 2024-08-26 – 2024-08-27 (×3): 243 mg via ORAL
  Filled 2024-08-25 (×3): qty 3

## 2024-08-25 MED ORDER — METHOCARBAMOL 500 MG PO TABS
750.0000 mg | ORAL_TABLET | Freq: Three times a day (TID) | ORAL | Status: DC | PRN
  Administered 2024-08-27: 750 mg via ORAL
  Filled 2024-08-25: qty 2

## 2024-08-25 MED ORDER — ESCITALOPRAM OXALATE 10 MG PO TABS
5.0000 mg | ORAL_TABLET | Freq: Every day | ORAL | Status: DC | PRN
  Administered 2024-08-27: 5 mg via ORAL
  Filled 2024-08-25: qty 1

## 2024-08-25 MED ORDER — INSULIN REGULAR(HUMAN) IN NACL 100-0.9 UT/100ML-% IV SOLN
INTRAVENOUS | Status: DC
  Administered 2024-08-25: 13 [IU]/h via INTRAVENOUS
  Filled 2024-08-25: qty 100

## 2024-08-25 MED ORDER — ONDANSETRON HCL 4 MG/2ML IJ SOLN
4.0000 mg | Freq: Once | INTRAMUSCULAR | Status: AC
  Administered 2024-08-25: 4 mg via INTRAVENOUS
  Filled 2024-08-25: qty 2

## 2024-08-25 MED ORDER — FENTANYL CITRATE (PF) 50 MCG/ML IJ SOSY
50.0000 ug | PREFILLED_SYRINGE | Freq: Once | INTRAMUSCULAR | Status: AC
  Administered 2024-08-25: 50 ug via INTRAVENOUS
  Filled 2024-08-25: qty 1

## 2024-08-25 MED ORDER — OXYCODONE-ACETAMINOPHEN 5-325 MG PO TABS
1.0000 | ORAL_TABLET | ORAL | Status: DC | PRN
  Administered 2024-08-25: 1 via ORAL
  Filled 2024-08-25: qty 1

## 2024-08-25 MED ORDER — IMIPRAMINE HCL 25 MG PO TABS
50.0000 mg | ORAL_TABLET | Freq: Every day | ORAL | Status: DC
  Administered 2024-08-25 – 2024-08-26 (×2): 50 mg via ORAL
  Filled 2024-08-25 (×2): qty 2

## 2024-08-25 MED ORDER — DEXTROSE IN LACTATED RINGERS 5 % IV SOLN: INTRAVENOUS | Status: DC

## 2024-08-25 NOTE — Assessment & Plan Note (Signed)
-  chronic avoid nephrotoxic medications such as NSAIDs, Vanco Zosyn combo,  avoid hypotension, continue to follow renal function

## 2024-08-25 NOTE — Assessment & Plan Note (Signed)
 Stable continue Crestor  20 mg a day Patient currently on high-dose aspirin  for prophylaxis of DVT

## 2024-08-25 NOTE — ED Provider Notes (Signed)
 " [place] EMERGENCY DEPARTMENT AT [facility] Provider Note   CSN: [id number] Arrival date & time: 08/25/24  1215     Patient presents with: Hyperglycemia   [name] is a 74 y.o. female patient with history of diabetes and DVT not currently on any anticoagulation who presents to the emergency department today for further evaluation of elevated blood sugars.  Patient states that her blood sugars have been up to 500 at home.  She does take insulin  and has an insulin  pump.  She states normally her blood sugar is normal.  Patient had a hip replacement on 08/20/2024.  Blood sugars were normal up until yesterday.  She had a normal PT appointment for her left hip.  Son is at bedside and provides some the history and states that she has had this in the past prior with a previous back surgery and ultimately had a DVT as well.  Patient complaining of mild abdominal pain, left lower extremity pain behind the knee, and nausea.  She denies any vomiting, fever, chills, diarrhea.     Hyperglycemia      Prior to Admission medications  Medication Sig Start Date End Date Taking? Authorizing Provider  acetaminophen  (TYLENOL ) 500 MG tablet Take 1,000 mg by mouth every 8 (eight) hours as needed for mild pain or headache.    [provider]  amLODipine  (NORVASC ) 2.5 MG tablet Take 2 tablets (5 mg total) by mouth daily. Patient taking differently: Take 2.5 mg by mouth daily. 05/25/21   Shlomo Wilbert SAUNDERS, MD  aspirin  EC 325 MG tablet Take 1 tablet (325 mg total) by mouth 2 (two) times daily. 08/19/24   Orlando Camellia POUR, PA-C  [Paused] aspirin  EC 81 MG tablet Take 81 mg by mouth daily. Wait to take this until: September 16, 2024    [provider]  cetirizine (ZYRTEC) 10 MG tablet Take 10 mg by mouth daily.    [provider]  Cholecalciferol (VITAMIN D3) 125 MCG (5000 UT) TABS Take 5,000 Units by mouth in the morning.    [provider]  Clobetasol  Propionate  (TEMOVATE ) 0.05 % external spray Apply 1 spray topically at bedtime as needed (rosacea).    [provider]  docusate sodium  (COLACE) 100 MG capsule Take 1 capsule (100 mg total) by mouth 2 (two) times daily. 08/19/24 08/19/25  Orlando Camellia POUR, PA-C  doxycycline  (VIBRAMYCIN ) 50 MG capsule Take 50 mg by mouth daily. 01/26/24   [provider]  escitalopram  (LEXAPRO ) 5 MG tablet Take 5 mg by mouth daily as needed (anxiety). 03/24/22   [provider]  Fluocinolone Acetonide 0.01 % OIL Place 5 drops in ear(s) daily as needed (rosacea). 10/06/23   [provider]  gabapentin  (NEURONTIN ) 300 MG capsule Take 900 mg by mouth at bedtime.    [provider]  HUMALOG  100 UNIT/ML injection Inject 0-110 Units into the skin continuous as needed for high blood sugar (via pump). 02/03/22   [provider]  imipramine  (TOFRANIL ) 50 MG tablet Take 1 tablet (50 mg total) by mouth daily. Patient taking differently: Take 50 mg by mouth at bedtime. 01/16/24   Lomax, Amy, NP  irbesartan -hydrochlorothiazide  (AVALIDE) 300-12.5 MG tablet Take 1 tablet by mouth in the morning.    [provider]  levothyroxine  (SYNTHROID ) 150 MCG tablet Take 150 mcg by mouth daily before breakfast.    [provider]  LORazepam  (ATIVAN ) 0.5 MG tablet Take 0.5-1 mg by mouth every 8 (eight) hours as  needed for anxiety. 03/15/19   [provider]  Magnesium  250 MG TABS Take 250 mg by mouth in the morning.    [provider]  methocarbamol  (ROBAXIN ) 750 MG tablet Take 750 mg by mouth every 8 (eight) hours as needed for muscle spasms.    [provider]  metoprolol  succinate (TOPROL -XL) 25 MG 24 hr tablet TAKE 1 TABLET (25 MG TOTAL) BY MOUTH DAILY. 08/14/24   Shlomo Wilbert SAUNDERS, MD  omeprazole (PRILOSEC) 40 MG capsule Take 40 mg by mouth daily. 07/08/24   [provider]  oxyCODONE -acetaminophen  (PERCOCET/ROXICET) 5-325 MG tablet Take 1 tablet by mouth  every 4 (four) hours as needed for severe pain (pain score 7-10). 08/19/24   Orlando Camellia POUR, PA-C  Potassium 99 MG TABS Take 99 mg by mouth in the morning.    [provider]  Probiotic Product (PROBIOTIC PO) Take 1 capsule by mouth in the morning.    [provider]  promethazine  (PHENERGAN ) 25 MG tablet Take 25 mg by mouth every 8 (eight) hours as needed for nausea or vomiting.    [provider]  rosuvastatin  (CRESTOR ) 20 MG tablet Take 20 mg by mouth in the morning.    [provider]  tiZANidine  (ZANAFLEX ) 4 MG tablet Take 1 tablet (4 mg total) by mouth every 6 (six) hours as needed for muscle spasms. 08/19/24   Orlando Camellia POUR, PA-C  vitamin B-12 (CYANOCOBALAMIN ) 1000 MCG tablet Take 1,000 mcg by mouth in the morning.    [provider]  zinc  sulfate 220 (50 Zn) MG capsule Take 220 mg by mouth in the morning.    [provider]    Allergies: Dilaudid  [hydromorphone  hcl], Heparin , Ambien  [zolpidem  tartrate], Belsomra [suvorexant], Codeine, Morphine  and codeine, Penicillins, Statins, Ceftin [cefuroxime axetil], Elavil [amitriptyline], Lovaza  [omega-3-acid  ethyl esters (fish)], Lunesta [eszopiclone], Lyrica [pregabalin], and Metformin  and related    Review of Systems  All other systems reviewed and are negative.   Updated Vital Signs BP (!) 150/62   Pulse 80   Temp 98.2 F (36.8 C) (Oral)   Resp 13   Ht 5' 2 (1.575 m)   Wt 86.2 kg   SpO2 100%   BMI 34.75 kg/m   Physical Exam Vitals and nursing note reviewed.  Constitutional:      General: She is not in acute distress.    Appearance: Normal appearance.  HENT:     Head: Normocephalic and atraumatic.  Eyes:     General:        Right eye: No discharge.        Left eye: No discharge.  Cardiovascular:     Comments: Regular rate and rhythm.  S1/S2 are distinct without any evidence of murmur, rubs, or gallops.  Radial pulses are 2+ bilaterally.  Dorsalis pedis pulses are 2+  bilaterally.  No evidence of pedal edema. Pulmonary:     Comments: Clear to auscultation bilaterally.  Normal effort.  No respiratory distress.  No evidence of wheezes, rales, or rhonchi heard throughout. Abdominal:     General: Abdomen is flat. Bowel sounds are normal. There is no distension.     Tenderness: There is no abdominal tenderness. There is no guarding or rebound.  Musculoskeletal:        General: Normal range of motion.     Cervical back: Neck supple.     Comments: Negative Homans' sign.  Skin:    General: Skin is warm and dry.     Findings: No  rash.  Neurological:     General: No focal deficit present.     Mental Status: She is alert.  Psychiatric:        Mood and Affect: Mood normal.        Behavior: Behavior normal.     (all labs ordered are listed, but only abnormal results are displayed) Labs Reviewed  BASIC METABOLIC PANEL WITH GFR - Abnormal; Notable for the following components:      Result Value   Sodium 132 (*)    Chloride 91 (*)    CO2 19 (*)    Glucose, Bld 435 (*)    BUN 30 (*)    Creatinine, Ser 1.39 (*)    GFR, Estimated 40 (*)    Anion gap 22 (*)    All other components within normal limits  CBC - Abnormal; Notable for the following components:   Hemoglobin 10.9 (*)    HCT 32.4 (*)    All other components within normal limits  URINALYSIS, ROUTINE W REFLEX MICROSCOPIC - Abnormal; Notable for the following components:   Glucose, UA >1,000 (*)    Ketones, ur 40 (*)    Leukocytes,Ua MODERATE (*)    All other components within normal limits  BETA-HYDROXYBUTYRIC ACID - Abnormal; Notable for the following components:   Beta-Hydroxybutyric Acid 4.52 (*)    All other components within normal limits  BASIC METABOLIC PANEL WITH GFR - Abnormal; Notable for the following components:   Sodium 134 (*)    Chloride 97 (*)    Glucose, Bld 264 (*)    BUN 27 (*)    Creatinine, Ser 1.17 (*)    GFR, Estimated 49 (*)    All other components within normal  limits  GLUCOSE, CAPILLARY - Abnormal; Notable for the following components:   Glucose-Capillary 205 (*)    All other components within normal limits  CBG MONITORING, ED - Abnormal; Notable for the following components:   Glucose-Capillary 420 (*)    All other components within normal limits  I-STAT VENOUS BLOOD GAS, ED - Abnormal; Notable for the following components:   pCO2, Ven 17.9 (*)    pO2, Ven 19 (*)    Bicarbonate 11.1 (*)    TCO2 12 (*)    Acid-base deficit 13.0 (*)    Potassium 2.5 (*)    Calcium , Ion 0.94 (*)    HCT 20.0 (*)    Hemoglobin 6.8 (*)    All other components within normal limits  CBG MONITORING, ED - Abnormal; Notable for the following components:   Glucose-Capillary 356 (*)    All other components within normal limits  CBG MONITORING, ED - Abnormal; Notable for the following components:   Glucose-Capillary 289 (*)    All other components within normal limits  CBG MONITORING, ED - Abnormal; Notable for the following components:   Glucose-Capillary 211 (*)    All other components within normal limits    EKG: None  Radiology: US  Venous Img Lower Unilateral Left Result Date: 08/25/2024 CLINICAL DATA:  74 year old female with left lower extremity pain. History of prior deep vein thrombosis. EXAM: LEFT LOWER EXTREMITY VENOUS DOPPLER ULTRASOUND TECHNIQUE: Gray-scale sonography with graded compression, as well as color Doppler and duplex ultrasound were performed to evaluate the left lower extremity deep venous systems from the level of the common femoral vein and including the common femoral, femoral, profunda femoral, popliteal and calf veins including the posterior tibial, peroneal and gastrocnemius veins when visible. Spectral Doppler was utilized to  evaluate flow at rest and with distal augmentation maneuvers in the common femoral, femoral and popliteal veins. The contralateral common femoral vein was also evaluated for comparison. COMPARISON:  None Available.  FINDINGS: LEFT LOWER EXTREMITY Common Femoral Vein: Small focal echogenic, wall adherent, chronic appearing thrombus is present. No evidence of acute thrombus. Normal compressibility, respiratory phasicity and response to augmentation. Central Greater Saphenous Vein: No evidence of thrombus. Normal compressibility and flow on color Doppler imaging. Central Profunda Femoral Vein: No evidence of thrombus. Normal compressibility and flow on color Doppler imaging. Femoral Vein: No evidence of thrombus. Normal compressibility, respiratory phasicity and response to augmentation. Popliteal Vein: No evidence of thrombus. Normal compressibility, respiratory phasicity and response to augmentation. Calf Veins: No evidence of thrombus. Normal compressibility and flow on color Doppler imaging. Other Findings:  None. RIGHT LOWER EXTREMITY Common Femoral Vein: No evidence of thrombus. Normal compressibility, respiratory phasicity and response to augmentation. IMPRESSION: No evidence of acute left lower extremity deep venous thrombosis. Small focal synechaie about the common femoral vein, likely sequela prior deep vein thrombosis. Ester Sides, MD [facility] Electronically Signed   By: Ester Sides M.D.   On: 08/25/2024 15:25     .Critical Care  Performed by: Theotis Cameron HERO, PA-C Authorized by: Theotis Cameron HERO, PA-C   Critical care provider statement:    Critical care time (minutes):  35   Critical care time was exclusive of:  Separately billable procedures and treating other patients   Critical care was necessary to treat or prevent imminent or life-threatening deterioration of the following conditions:  Endocrine crisis   Critical care was time spent personally by me on the following activities:  Blood draw for specimens, development of treatment plan with patient or surrogate, discussions with consultants, discussions with primary provider, ordering  and performing treatments and interventions, ordering and review of laboratory studies, ordering and review of radiographic studies, pulse oximetry and re-evaluation of patient's condition    Medications Ordered in the ED  insulin  regular, human (MYXREDLIN ) 100 units/ 100 mL infusion (4.4 Units/hr Intravenous Rate/Dose Change 08/25/24 1634)  dextrose  5 % in lactated ringers  infusion ( Intravenous New Bag/Given 08/25/24 1640)  dextrose  50 % solution 0-50 mL (has no administration in time range)  lactated ringers  infusion (0 mLs Intravenous Stopped 08/25/24 1632)  sodium chloride  0.9 % bolus 1,000 mL (0 mLs Intravenous Stopped 08/25/24 1430)  ondansetron  (ZOFRAN ) injection 4 mg (4 mg Intravenous Given 08/25/24 1304)  fentaNYL  (SUBLIMAZE ) injection 50 mcg (50 mcg Intravenous Given 08/25/24 1313)  lactated ringers  bolus 1,000 mL (0 mLs Intravenous Stopped 08/25/24 1521)  potassium chloride  10 mEq in 100 mL IVPB (0 mEq Intravenous Stopped 08/25/24 1621)  morphine  (PF) 4 MG/ML injection 4 mg (4 mg Intravenous Given 08/25/24 1420)  diphenhydrAMINE  (BENADRYL ) injection 25 mg (25 mg Intravenous Given 08/25/24 1419)    Clinical Course as of 08/25/24 1915  Sun Aug 25, 2024  1630 I spoke with Dr. Dania with [facility] hospitalist who agrees to admit the patient. [CF]  1630 CBC(!) No evidence of leukocytosis.  Mild anemia. [CF]  1630 Basic metabolic panel(!) Mild hyponatremia in the setting of hyperglycemia.  Potassium is normal.  Creatinine is at baseline. [CF]  1631 Urinalysis, Routine w reflex microscopic -Urine, Clean Catch(!) Negative.  [CF]  1631 Beta-hydroxybutyric acid(!) Significantly elevated. [CF]  1631 I-Stat venous blood gas, ([facility], [facility], [facility])(!!) pH is normal but bicarb initially was 17.9.  Indicative of DKA. [CF]  1631  US  Venous Img Lower Unilateral Left Negative for blood clot.  I do agree with radiologist interpretation. [CF]    Clinical Course User Index [CF] Theotis Cameron HERO, PA-C     Medical Decision Making RAHAF CARBONELL is a 74 y.o. female patient who presents to the emergency department today for further evaluation of nausea and elevated blood sugars.  Certainly concern for DKA, HHS, gastroenteritis, symptomatic hyperglycemia, postop complication from left hip arthroplasty.  Will plan to get basic labs working her up for DKA and ultrasound of the left lower extremity to evaluate for possible DVT.  As highlighted in the ED course, patient will be admitted to hospital for DKA.  Insulin  drip was started.  Patient overall clinically improved.  Vital signs are normal.  She is stable for admission.  On repeat evaluation, she is feeling better.  Nausea has resolved.  She is feeling better with fluids.  Amount and/or Complexity of Data Reviewed Labs: ordered. Decision-making details documented in ED Course. Radiology:  Decision-making details documented in ED Course.  Risk Prescription drug management. Decision regarding hospitalization.     Final diagnoses:  Diabetic ketoacidosis without coma associated with type 2 diabetes mellitus [facility])    ED Discharge Orders     None          Theotis Cameron HERO, [place] 08/25/24 1915    Ula Prentice SAUNDERS, MD 09/02/24 1514  "

## 2024-08-25 NOTE — Assessment & Plan Note (Signed)
 Continue Crestor 20 mg a day

## 2024-08-25 NOTE — Assessment & Plan Note (Addendum)
 Initial hemoglobin noted to be down to 6.8 this felt to be an error Will repeat Patient denies any melena no black stools no blood in stools Repeat HG is 10.7 which is stable

## 2024-08-25 NOTE — Assessment & Plan Note (Signed)
 will admit per DKA   obtain serial BMET, start on glucosestabalizer, aggressive IVF.   Change IVF to D5 1/2Na after BG <250 .   Monitor in [place]. Replace potassium as needed.    DC insuline pump while on the drip.  Consult diabetes coordinator

## 2024-08-25 NOTE — Subjective & Objective (Signed)
 Patient came in to [facility] with blood sugars elevated to 500 Patient is on insulin  pump had a left hip replacement about a week ago on 20 August 2024 Her blood sugars usually as well-controlled but for the past 24 hours has been elevated. She has been having some abdominal pain and left lower extremity pain behind the knee Also associated with some nausea No fevers no chills She has past history of DVT associated with back surgery I [facility] patient was found to have evidence of DKA's given 2 L IV fluids and started on insulin  drip.  Dopplers of left lower extremity showed no evidence of DVT

## 2024-08-25 NOTE — H&P (Signed)
 "    [name] FMW:[id number] DOB: [birth year] DOA: 08/25/2024     PCP: [name], MD   Outpatient Specialists:  CARDS:     Randine Bihari  Patient arrived to ER on 08/25/24 at 1215 Referred by Attending Silvester Ales, MD   Patient coming from:    home Lives With family   Chief Complaint:   Chief Complaint  Patient presents with   Hyperglycemia    HPI: [name] is a 74 y.o. female with medical history significant of type 1 diabetes on insulin  drip, dyslipidemia, CKD stage III, morbid obesity, depression, HTN, hypothyroidism    Presented with hyperglycemia and left leg pain  Patient came in to [facility] with blood sugars elevated to 500 Patient is on insulin  pump had a left hip replacement about a week ago on 20 August 2024 Her blood sugars usually as well-controlled but for the past 24 hours has been elevated. She has been having some abdominal pain and left lower extremity pain behind the knee Also associated with some nausea No fevers no chills She has past history of DVT associated with back surgery I [facility] patient was found to have evidence of DKA's given 2 L IV fluids and started on insulin  drip.  Dopplers of left lower extremity showed no evidence of DVT   Reports she has been taking her insulin  using her pump but it was not lowering her BG At home pain controlled with percocet  Denies constipation no diarrhea  No CP no SOB    Denies significant ETOH intake  Does not smoke    Regarding pertinent Chronic problems:   Hyperlipidemia - on statins crestor  Lipid Panel     Component Value Date/Time   CHOL 162 03/25/2020 1038   TRIG 109 03/25/2020 1038   HDL 48 03/25/2020 1038   CHOLHDL 3.4 03/25/2020 1038   CHOLHDL 3.7 11/22/2017 0800   VLDL 32 11/22/2017 0800   LDLCALC 94 03/25/2020 1038   LABVLDL 20 03/25/2020 1038     HTN on Norvasc , Avalide, toprol   chronic CHF diastolic- last echo Recent Results (from the past 56199 hours)   ECHOCARDIOGRAM COMPLETE   Collection Time: 06/27/23 11:27 AM  Result Value   Area-P 1/2 3.45   S' Lateral 2.40   P 1/2 time 357   Est EF 65 - 70%   Narrative      ECHOCARDIOGRAM REPORT   \  1. Left ventricular ejection fraction, by estimation, is 65 to 70%. The left ventricle has normal function. The left ventricle has no regional wall motion abnormalities. There is mild concentric left ventricular hypertrophy. Left ventricular diastolic  parameters are consistent with Grade I diastolic dysfunction (impaired relaxation).  2. Right ventricular systolic function is normal. The right ventricular size is normal. There is normal pulmonary artery systolic pressure. The estimated right ventricular systolic pressure is 21.5 mmHg.  3. Left atrial size was moderately dilated.  4. Right atrial size was mildly dilated.  5. The mitral valve is degenerative. No evidence of mitral valve regurgitation. No evidence of mitral stenosis. Moderate mitral annular calcification.  6. The aortic valve is tricuspid. There is mild calcification of the aortic valve. There is mild thickening of the aortic valve. Aortic valve regurgitation is trivial. Aortic valve sclerosis/calcification is present, without any evidence of aortic  stenosis.            DM 1 - LADA Lab Results  Component Value Date   HGBA1C 7.3 (H) 08/12/2024  on insulin ,     Hypothyroidism:   Lab Results  Component Value Date   TSH 1.310 07/10/2019   on synthroid    obesity-   BMI Readings from Last 1 Encounters:  08/25/24 34.75 kg/m       Hx of DVT/PE on - was not on [facility]     CKD stage IIIa  baseline Cr 1.4 Estimated Creatinine Clearance: 43 mL/min (A) (by C-G formula based on SCr of 1.17 mg/dL (H)).  Lab Results  Component Value Date   CREATININE 1.17 (H) 08/25/2024   CREATININE 1.39 (H) 08/25/2024   CREATININE 1.22 (H) 08/12/2024   Lab Results  Component Value Date   NA 134 (L) 08/25/2024   CL 97 (L) 08/25/2024   K 4.0  08/25/2024   CO2 23 08/25/2024   BUN 27 (H) 08/25/2024   CREATININE 1.17 (H) 08/25/2024   GFRNONAA 49 (L) 08/25/2024   CALCIUM  9.2 08/25/2024   PHOS 3.1 12/18/2018   ALBUMIN  3.9 03/20/2024   GLUCOSE 264 (H) 08/25/2024    Chronic anemia - baseline hg Hemoglobin & Hematocrit  Recent Labs    08/12/24 1205 08/25/24 1236 08/25/24 1318  HGB 13.8 10.9* 6.8*   Iron/TIBC/Ferritin/ %Sat    Component Value Date/Time   FERRITIN 58 11/19/2017 1019      While in ER: Clinical Course as of 08/25/24 1922  Sun Aug 25, 2024  1630 I spoke with Dr. Dania with [facility] hospitalist who agrees to admit the patient. [CF]  1630 CBC(!) No evidence of leukocytosis.  Mild anemia. [CF]  1630 Basic metabolic panel(!) Mild hyponatremia in the setting of hyperglycemia.  Potassium is normal.  Creatinine is at baseline. [CF]  1631 Urinalysis, Routine w reflex microscopic -Urine, Clean Catch(!) Negative.  [CF]  1631 Beta-hydroxybutyric acid(!) Significantly elevated. [CF]  1631 I-Stat venous blood gas, ([facility], [facility], [facility])(!!) pH is normal but bicarb initially was 17.9.  Indicative of DKA. [CF]  1631 US  Venous Img Lower Unilateral Left Negative for blood clot.  I do agree with radiologist interpretation. [CF]    Clinical Course User Index [CF] Theotis Cameron HERO, PA-C         Lab Orders         Basic metabolic panel         CBC         Urinalysis, Routine w reflex microscopic -Urine, Clean Catch         Beta-hydroxybutyric acid         Basic metabolic panel         Glucose, capillary         CBG monitoring, ED         I-Stat venous blood gas, ([facility], [facility], [facility])         CBG monitoring, ED         CBG monitoring, ED      Doppler  Left leg: Common Femoral Vein: Small focal echogenic, wall adherent, chronic appearing thrombus is present. No evidence of acute thrombus.   right  leg No evidence of acute left lower extremity deep venous thrombosis. Small focal synechaie about the common femoral vein,  likely sequela prior deep vein thrombosis.  Following Medications were ordered in ER: Medications  insulin  regular, human (MYXREDLIN ) 100 units/ 100 mL infusion (4.4 Units/hr Intravenous Rate/Dose Change 08/25/24 1634)  dextrose  5 % in lactated ringers  infusion ( Intravenous New Bag/Given 08/25/24 1640)  dextrose  50 % solution 0-50 mL (has no administration in time range)  lactated ringers  infusion (0 mLs Intravenous Stopped 08/25/24 1632)  sodium chloride  0.9 % bolus 1,000 mL (0 mLs Intravenous Stopped 08/25/24 1430)  ondansetron  (ZOFRAN ) injection 4 mg (4 mg Intravenous Given 08/25/24 1304)  fentaNYL  (SUBLIMAZE ) injection 50 mcg (50 mcg Intravenous Given 08/25/24 1313)  lactated ringers  bolus 1,000 mL (0 mLs Intravenous Stopped 08/25/24 1521)  potassium chloride  10 mEq in 100 mL IVPB (0 mEq Intravenous Stopped 08/25/24 1621)  morphine  (PF) 4 MG/ML injection 4 mg (4 mg Intravenous Given 08/25/24 1420)  diphenhydrAMINE  (BENADRYL ) injection 25 mg (25 mg Intravenous Given 08/25/24 1419)    __    ED Triage Vitals  Encounter Vitals Group     BP 08/25/24 1220 (!) 149/60     Girls Systolic BP Percentile --      Girls Diastolic BP Percentile --      Boys Systolic BP Percentile --      Boys Diastolic BP Percentile --      Pulse Rate 08/25/24 1220 98     Resp 08/25/24 1220 (!) 22     Temp 08/25/24 1220 97.6 F (36.4 C)     Temp Source 08/25/24 1220 Morgan     SpO2 08/25/24 1220 96 %     Weight 08/25/24 1221 190 lb (86.2 kg)     Height 08/25/24 1221 5' 2 (1.575 m)     Head Circumference --      Peak Flow --      Pain Score 08/25/24 1221 8     Pain Loc --      Pain Education --      Exclude from Growth Chart --   UFJK(75)@     _________________________________________ Significant initial  Findings: Abnormal Labs Reviewed  BASIC METABOLIC PANEL WITH GFR - Abnormal; Notable for the following components:      Result Value   Sodium 132 (*)    Chloride 91 (*)    CO2 19 (*)    Glucose, Bld 435  (*)    BUN 30 (*)    Creatinine, Ser 1.39 (*)    GFR, Estimated 40 (*)    Anion gap 22 (*)    All other components within normal limits  CBC - Abnormal; Notable for the following components:   Hemoglobin 10.9 (*)    HCT 32.4 (*)    All other components within normal limits  URINALYSIS, ROUTINE W REFLEX MICROSCOPIC - Abnormal; Notable for the following components:   Glucose, UA >1,000 (*)    Ketones, ur 40 (*)    Leukocytes,Ua MODERATE (*)    All other components within normal limits  BETA-HYDROXYBUTYRIC ACID - Abnormal; Notable for the following components:   Beta-Hydroxybutyric Acid 4.52 (*)    All other components within normal limits  BASIC METABOLIC PANEL WITH GFR - Abnormal; Notable for the following components:   Sodium 134 (*)    Chloride 97 (*)    Glucose, Bld 264 (*)    BUN 27 (*)    Creatinine, Ser 1.17 (*)    GFR, Estimated 49 (*)    All other components within normal limits  GLUCOSE, CAPILLARY - Abnormal; Notable for the following components:   Glucose-Capillary 205 (*)    All other components within normal limits  CBG MONITORING, ED - Abnormal; Notable for the following components:   Glucose-Capillary 420 (*)    All other components within normal limits  I-STAT VENOUS BLOOD GAS, ED - Abnormal; Notable for the following components:   pCO2, Ven 17.9 (*)  pO2, Ven 19 (*)    Bicarbonate 11.1 (*)    TCO2 12 (*)    Acid-base deficit 13.0 (*)    Potassium 2.5 (*)    Calcium , Ion 0.94 (*)    HCT 20.0 (*)    Hemoglobin 6.8 (*)    All other components within normal limits  CBG MONITORING, ED - Abnormal; Notable for the following components:   Glucose-Capillary 356 (*)    All other components within normal limits  CBG MONITORING, ED - Abnormal; Notable for the following components:   Glucose-Capillary 289 (*)    All other components within normal limits  CBG MONITORING, ED - Abnormal; Notable for the following components:   Glucose-Capillary 211 (*)    All other  components within normal limits       ECG: Ordered Personally reviewed and interpreted by me showing: HR : 76 Rhythm: NSR,   RBBB,     no evidence of ischemic changes QTC 477  The recent clinical data is shown below. Vitals:   08/25/24 1825 08/25/24 1830 08/25/24 1835 08/25/24 1840  BP: (!) 150/62     Pulse: 86 83 81 80  Resp: 15 16 16 13   Temp:      TempSrc:      SpO2: 97% 100% 100% 100%  Weight:      Height:       WBC     Component Value Date/Time   WBC 7.6 08/25/2024 1236   LYMPHSABS 2.7 12/02/2019 1523   MONOABS 0.4 12/02/2019 1523   EOSABS 0.2 12/02/2019 1523   BASOSABS 0.1 12/02/2019 1523    Lactic Acid, Venous    Component Value Date/Time   LATICACIDVEN 1.3 06/02/2022 1011     UA  no evidence of UTI     Urine analysis:    Component Value Date/Time   COLORURINE YELLOW 08/25/2024 1236   APPEARANCEUR CLEAR 08/25/2024 1236   LABSPEC 1.023 08/25/2024 1236   PHURINE 5.0 08/25/2024 1236   GLUCOSEU >1,000 (A) 08/25/2024 1236   HGBUR NEGATIVE 08/25/2024 1236   BILIRUBINUR NEGATIVE 08/25/2024 1236   KETONESUR 40 (A) 08/25/2024 1236   PROTEINUR NEGATIVE 08/25/2024 1236   UROBILINOGEN 0.2 07/30/2012 2228   NITRITE NEGATIVE 08/25/2024 1236   LEUKOCYTESUR MODERATE (A) 08/25/2024 1236    Results for orders placed or performed during the hospital encounter of 08/12/24  Surgical pcr screen     Status: None   Collection Time: 08/12/24 12:05 PM   Specimen: Nasal Mucosa; Nasal Swab  Result Value Ref Range Status   MRSA, PCR NEGATIVE NEGATIVE Final   Staphylococcus aureus NEGATIVE NEGATIVE Final    Comment: (NOTE) The Xpert SA Assay (FDA approved for NASAL specimens in patients 61 years of age and older), is one component of a comprehensive surveillance program. It is not intended to diagnose infection nor to guide or monitor treatment. Performed at [facility], [address] [place], [place] [address]      ________________________________________________________________  Venous  Blood Gas result:  pH  7.399 Calcium , Ion 0.94 Low  mmol/L   pCO2, Ven 17.9 Low Panic  mmHg HCT 20.0 Low  %  pO2, Ven 19 Low Panic  mmHg     ABG    Component Value Date/Time   PHART 7.364 11/19/2017 0435   PCO2ART 38.3 11/19/2017 0435   PO2ART 200 (H) 11/19/2017 0435   HCO3 11.1 (L) 08/25/2024 1318   TCO2 12 (L) 08/25/2024 1318   ACIDBASEDEF 13.0 (H) 08/25/2024 1318  O2SAT 33 08/25/2024 1318       __________________________________________________________ Recent Labs  Lab 08/25/24 1236 08/25/24 1318 08/25/24 1538  NA 132* 142 134*  K 4.4 2.5* 4.0  CO2 19*  --  23  GLUCOSE 435*  --  264*  BUN 30*  --  27*  CREATININE 1.39*  --  1.17*  CALCIUM  10.1  --  9.2    Cr   stable,  Lab Results  Component Value Date   CREATININE 1.17 (H) 08/25/2024   CREATININE 1.39 (H) 08/25/2024   CREATININE 1.22 (H) 08/12/2024    No results for input(s): AST, ALT, ALKPHOS, BILITOT, PROT, ALBUMIN  in the last 168 hours. Lab Results  Component Value Date   CALCIUM  9.2 08/25/2024   PHOS 3.1 12/18/2018    Plt: Lab Results  Component Value Date   PLT 319 08/25/2024    Recent Labs  Lab 08/25/24 1236 08/25/24 1318  WBC 7.6  --   HGB 10.9* 6.8*  HCT 32.4* 20.0*  MCV 80.4  --   PLT 319  --     HG/HCT    Down from baseline see below    Component Value Date/Time   HGB 6.8 (LL) 08/25/2024 1318   HGB 13.9 07/10/2019 1442   HCT 20.0 (L) 08/25/2024 1318   HCT 42.4 07/10/2019 1442   MCV 80.4 08/25/2024 1236   MCV 86 07/10/2019 1442    _______________________________________________ Hospitalist was called for admission for   Diabetic ketoacidosis without coma associated with type 1 diabetes mellitus (HCC)    The following Work up has been ordered so far:  Orders Placed This Encounter  Procedures   Critical Care   US  Venous Img Lower Unilateral Left   Basic metabolic panel   CBC    Urinalysis, Routine w reflex microscopic -Urine, Clean Catch   Beta-hydroxybutyric acid   Basic metabolic panel   Glucose, capillary   Notify physician (specify)   If present, discontinue Insulin  Pump after IV Insulin  is initiated.   Do NOT use lab glucose values in [facility].  If CBG meter reads Critical High, enter 600.   IV insulin  infusion with sufficient glucose should be continued until MD determines acidosis is corrected and places transition orders.   Upon IV fluid bolus completion, place order for STAT BMET (LAB15) and call provider with results.   IV bolus already initiated   Cardiac Monitoring - Continuous Indefinite   Consult to hospitalist   CBG monitoring, ED   I-Stat venous blood gas, ([facility], [facility], [facility])   CBG monitoring, ED   CBG monitoring, ED   EKG   EKG   Insert peripheral IV   Admit to Inpatient (patient's expected length of stay will be greater than 2 midnights or inpatient only procedure)     OTHER Significant initial  Findings:  labs showing:     DM  labs:  HbA1C: Recent Labs    03/20/24 1154 08/12/24 1205  HGBA1C 8.7* 7.3*      CBG (last 3)  Recent Labs    08/25/24 1530 08/25/24 1629 08/25/24 1823  GLUCAP 289* 211* 205*       Cultures:    Component Value Date/Time   SDES IN/OUT CATH URINE 06/02/2022 1535   SPECREQUEST NONE 06/02/2022 1535   CULT (A) 06/02/2022 1535    <10,000 COLONIES/mL INSIGNIFICANT GROWTH Performed at [facility], [address] [place], [place] [address]    REPTSTATUS 06/04/2022 FINAL 06/02/2022 1535     Radiological  Exams on Admission: US  Venous Img Lower Unilateral Left Result Date: 08/25/2024 CLINICAL DATA:  74 year old female with left lower extremity pain. History of prior deep vein thrombosis. EXAM: LEFT LOWER EXTREMITY VENOUS DOPPLER ULTRASOUND TECHNIQUE: Gray-scale sonography with graded compression, as well as color Doppler and duplex ultrasound were performed to evaluate the left lower extremity  deep venous systems from the level of the common femoral vein and including the common femoral, femoral, profunda femoral, popliteal and calf veins including the posterior tibial, peroneal and gastrocnemius veins when visible. Spectral Doppler was utilized to evaluate flow at rest and with distal augmentation maneuvers in the common femoral, femoral and popliteal veins. The contralateral common femoral vein was also evaluated for comparison. COMPARISON:  None Available. FINDINGS: LEFT LOWER EXTREMITY Common Femoral Vein: Small focal echogenic, wall adherent, chronic appearing thrombus is present. No evidence of acute thrombus. Normal compressibility, respiratory phasicity and response to augmentation. Central Greater Saphenous Vein: No evidence of thrombus. Normal compressibility and flow on color Doppler imaging. Central Profunda Femoral Vein: No evidence of thrombus. Normal compressibility and flow on color Doppler imaging. Femoral Vein: No evidence of thrombus. Normal compressibility, respiratory phasicity and response to augmentation. Popliteal Vein: No evidence of thrombus. Normal compressibility, respiratory phasicity and response to augmentation. Calf Veins: No evidence of thrombus. Normal compressibility and flow on color Doppler imaging. Other Findings:  None. RIGHT LOWER EXTREMITY Common Femoral Vein: No evidence of thrombus. Normal compressibility, respiratory phasicity and response to augmentation. IMPRESSION: No evidence of acute left lower extremity deep venous thrombosis. Small focal synechaie about the common femoral vein, likely sequela prior deep vein thrombosis. Ester Sides, MD [facility] Radiology Electronically Signed   By: Ester Sides M.D.   On: 08/25/2024 15:25   _______________________________________________________________________________________________________ Latest  Blood pressure (!) 150/62, pulse 80, temperature 98.2 F (36.8  C), temperature source Morgan, resp. rate 13, height 5' 2 (1.575 m), weight 86.2 kg, SpO2 100%.   Vitals  labs and radiology finding personally reviewed  Review of Systems:    Pertinent positives include:  left leg pain   Constitutional:  No weight loss, night sweats, Fevers, chills, fatigue, weight loss  HEENT:  No headaches, Difficulty swallowing,Tooth/dental problems,Sore throat,  No sneezing, itching, ear ache, nasal congestion, post nasal drip,  Cardio-vascular:  No chest pain, Orthopnea, PND, anasarca, dizziness, palpitations.no Bilateral lower extremity swelling  GI:  No heartburn, indigestion, abdominal pain, nausea, vomiting, diarrhea, change in bowel habits, loss of appetite, melena, blood in stool, hematemesis Resp:  no shortness of breath at rest. No dyspnea on exertion, No excess mucus, no productive cough, No non-productive cough, No coughing up of blood.No change in color of mucus.No wheezing. Skin:  no rash or lesions. No jaundice GU:  no dysuria, change in color of urine, no urgency or frequency. No straining to urinate.  No flank pain.  Musculoskeletal:  No joint pain or no joint swelling. No decreased range of motion. No back pain.  Psych:  No change in mood or affect. No depression or anxiety. No memory loss.  Neuro: no localizing neurological complaints, no tingling, no weakness, no double vision, no gait abnormality, no slurred speech, no confusion  All systems reviewed and apart from HOPI all are negative _______________________________________________________________________________________________ Past Medical History:   Past Medical History:  Diagnosis Date   Anxiety    Blood transfusion    CAD (coronary artery disease), native coronary artery    coronary CTA showed a coronary Ca score of 506,  mild CAD with 25-49% mRCA and moderate CAD with 50-69% sequential mRCA, 25-49% inferior sub branch of D1 and 25-49% in pLCx.     Cervical spondylosis with  myelopathy and radiculopathy    CVA (cerebral vascular accident) (HCC)    11/2017 - no weakness or paralysis   Depression    Diabetes mellitus    Type 1   Diastolic dysfunction    Diverticulitis    DVT (deep venous thrombosis) (HCC)    times 2 lower leg   Endometriosis    Fibromyalgia    Headache    History of kidney stones    Hyperlipidemia    Hypertension    Hypothyroidism    Mild aortic insufficiency    by echo 04/2013   Osteoarthritis    back and knee   Ovarian cyst    PONV (postoperative nausea and vomiting)    RLS (restless legs syndrome)    Thoracic compression fracture (HCC)    Villous adenoma of right colon 04/08/2016   Vitamin D  deficiency disease      Past Surgical History:  Procedure Laterality Date   ANTERIOR CERVICAL DECOMP/DISCECTOMY FUSION N/A 03/23/2017   Procedure: ANTERIOR CERVICAL DECOMPRESSION/DISCECTOMY FUSION, INTERBODY PROSTHESIS,PLATE CERVICAL THREE- CERVICAL FOUR, CERVICAL FOUR- CERVICAL FIVE, CERVICAL FIVE- CERVICAL SIX;  Surgeon: Mavis Purchase, MD;  Location: [facility] OR;  Service: Neurosurgery;  Laterality: N/A;   ANTERIOR CERVICAL DECOMP/DISCECTOMY FUSION N/A 04/18/2019   Procedure: ANTERIOR CERVICAL DECOMPRESSION/DISCECTOMY  CERVICAL SIX- CERVICAL SEVEN;  Surgeon: Mavis Purchase, MD;  Location: [facility] OR;  Service: Neurosurgery;  Laterality: N/A;  anterior   APPENDECTOMY     BACK SURGERY  2005   April  2013 - spinal fusion@ [facility]   bowel blockage surgery     CARDIAC CATHETERIZATION  04/2013   normal coronary arteries and normal LVF   CARPAL TUNNEL RELEASE  06/05/2012   Procedure: CARPAL TUNNEL RELEASE;  Surgeon: Lamar LULLA Leonor Mickey., MD;  Location: [facility];  Service: Orthopedics;  Laterality: Left;   CESAREAN SECTION     x 2   CHOLECYSTECTOMY     COLECTOMY  04/08/2016   DILATION AND CURETTAGE OF UTERUS     DORSAL COMPARTMENT RELEASE  06/05/2012   Procedure: RELEASE DORSAL COMPARTMENT (DEQUERVAIN);  Surgeon: Lamar LULLA Leonor Mickey.,  MD;  Location: [facility];  Service: Orthopedics;  Laterality: Left;  Excision of mixoid cyst also   EYE SURGERY     cataracts bilateral   INCISIONAL HERNIA REPAIR N/A 05/18/2022   Procedure: REPAIR INCISIONAL HERNIA WITH MESH;  Surgeon: Vanderbilt Ned, MD;  Location: [facility] OR;  Service: General;  Laterality: N/A;   JOINT REPLACEMENT     Bilateral knee   KNEE ARTHROPLASTY  2009   lft partial   KNEE ARTHROPLASTY     rt   LAPAROSCOPIC PARTIAL COLECTOMY N/A 04/08/2016   Procedure: LAPAROSCOPIC ASSISTED ASCENDING COLECTOMY POSSIBLE OPEN COLECTOMY;  Surgeon: Elon Pacini, MD;  Location: [facility] OR;  Service: General;  Laterality: N/A;   orthopedic surgeries     multiple   perforated bowl     Sep 09 2020   REDUCTION MAMMAPLASTY Bilateral    SHOULDER OPEN ROTATOR CUFF REPAIR     rt and lft   TEE WITHOUT CARDIOVERSION N/A 11/23/2017   Procedure: TRANSESOPHAGEAL ECHOCARDIOGRAM (TEE);  Surgeon: Jeffrie Oneil BROCKS, MD;  Location: [facility] ENDOSCOPY;  Service: Cardiovascular;  Laterality: N/A;   TOTAL HIP ARTHROPLASTY Left 08/19/2024   Procedure: ARTHROPLASTY, HIP, TOTAL, ANTERIOR APPROACH;  Surgeon: Yvone,  Norleen, MD;  Location: [facility] ORS;  Service: Orthopedics;  Laterality: Left;   TUBAL LIGATION     btsp    Social History:  Ambulatory  walker      reports that she has never smoked. She has never used smokeless tobacco. She reports that she does not drink alcohol and does not use drugs.   Family History:   Family History  Problem Relation Age of Onset   Heart attack Father        32s   Hypothyroidism Father    Diabetes type II Father    Kidney failure Father    Diabetes type II Mother    Hypertension Mother    Breast cancer Mother 85   Dementia Mother    Hypothyroidism Brother    Diabetes Brother    Breast cancer Maternal Aunt    Breast cancer Cousin    Breast cancer Maternal Aunt    Breast cancer Cousin    High blood pressure Other        all   Heart attack Brother    Lung  cancer Brother    ______________________________________________________________________________________________ Allergies: Allergies[1]   Prior to Admission medications  Medication Sig Start Date End Date Taking? Authorizing Provider  acetaminophen  (TYLENOL ) 500 MG tablet Take 1,000 mg by mouth every 8 (eight) hours as needed for mild pain or headache.    [provider]  amLODipine  (NORVASC ) 2.5 MG tablet Take 2 tablets (5 mg total) by mouth daily. Patient taking differently: Take 2.5 mg by mouth daily. 05/25/21   Shlomo Wilbert SAUNDERS, MD  aspirin  EC 325 MG tablet Take 1 tablet (325 mg total) by mouth 2 (two) times daily. 08/19/24   Orlando Camellia POUR, PA-C  [Paused] aspirin  EC 81 MG tablet Take 81 mg by mouth daily. Wait to take this until: September 16, 2024    [provider]  cetirizine (ZYRTEC) 10 MG tablet Take 10 mg by mouth daily.    [provider]  Cholecalciferol (VITAMIN D3) 125 MCG (5000 UT) TABS Take 5,000 Units by mouth in the morning.    [provider]  Clobetasol  Propionate (TEMOVATE ) 0.05 % external spray Apply 1 spray topically at bedtime as needed (rosacea).    [provider]  docusate sodium  (COLACE) 100 MG capsule Take 1 capsule (100 mg total) by mouth 2 (two) times daily. 08/19/24 08/19/25  Orlando Camellia POUR, PA-C  doxycycline  (VIBRAMYCIN ) 50 MG capsule Take 50 mg by mouth daily. 01/26/24   [provider]  escitalopram  (LEXAPRO ) 5 MG tablet Take 5 mg by mouth daily as needed (anxiety). 03/24/22   [provider]  Fluocinolone Acetonide 0.01 % OIL Place 5 drops in ear(s) daily as needed (rosacea). 10/06/23   [provider]  gabapentin  (NEURONTIN ) 300 MG capsule Take 900 mg by mouth at bedtime.    [provider]  HUMALOG  100 UNIT/ML injection Inject 0-110 Units into the skin continuous as needed for high blood sugar (via pump). 02/03/22   [provider]  imipramine  (TOFRANIL ) 50 MG tablet  Take 1 tablet (50 mg total) by mouth daily. Patient taking differently: Take 50 mg by mouth at bedtime. 01/16/24   Lomax, Amy, NP  irbesartan -hydrochlorothiazide  (AVALIDE) 300-12.5 MG tablet Take 1 tablet by mouth in the morning.    [provider]  levothyroxine  (SYNTHROID ) 150 MCG tablet Take 150 mcg by mouth daily before breakfast.    [provider]  LORazepam  (ATIVAN ) 0.5 MG tablet Take 0.5-1 mg by  mouth every 8 (eight) hours as needed for anxiety. 03/15/19   [provider]  Magnesium  250 MG TABS Take 250 mg by mouth in the morning.    [provider]  methocarbamol  (ROBAXIN ) 750 MG tablet Take 750 mg by mouth every 8 (eight) hours as needed for muscle spasms.    [provider]  metoprolol  succinate (TOPROL -XL) 25 MG 24 hr tablet TAKE 1 TABLET (25 MG TOTAL) BY MOUTH DAILY. 08/14/24   Shlomo Wilbert SAUNDERS, MD  omeprazole (PRILOSEC) 40 MG capsule Take 40 mg by mouth daily. 07/08/24   [provider]  oxyCODONE -acetaminophen  (PERCOCET/ROXICET) 5-325 MG tablet Take 1 tablet by mouth every 4 (four) hours as needed for severe pain (pain score 7-10). 08/19/24   Orlando Camellia POUR, PA-C  Potassium 99 MG TABS Take 99 mg by mouth in the morning.    [provider]  Probiotic Product (PROBIOTIC PO) Take 1 capsule by mouth in the morning.    [provider]  promethazine  (PHENERGAN ) 25 MG tablet Take 25 mg by mouth every 8 (eight) hours as needed for nausea or vomiting.    [provider]  rosuvastatin  (CRESTOR ) 20 MG tablet Take 20 mg by mouth in the morning.    [provider]  tiZANidine  (ZANAFLEX ) 4 MG tablet Take 1 tablet (4 mg total) by mouth every 6 (six) hours as needed for muscle spasms. 08/19/24   Orlando Camellia POUR, PA-C  vitamin B-12 (CYANOCOBALAMIN ) 1000 MCG tablet Take 1,000 mcg by mouth in the morning.    [provider]  zinc  sulfate 220 (50 Zn) MG capsule Take 220 mg by mouth in the morning.    [provider]    ___________________________________________________________________________________________________ Physical Exam:    08/25/2024    6:40 PM 08/25/2024    6:35 PM 08/25/2024    6:30 PM  Vitals with BMI  Pulse 80 81 83    1. General:  in No  Acute distress   Chronically ill   -appearing 2. Psychological: Alert and   Oriented 3. Head/ENT:   Moist  Dry Mucous Membranes                          Head Non traumatic, neck supple                           Poor Dentition 4. SKIN:  decreased Skin turgor,  Skin clean Dry and intact no rash    5. Heart: Regular rate and rhythm no  Murmur, no Rub or gallop 6. Lungs:   no wheezes or crackles   7. Abdomen: Soft,  non-tender, Non distended bowel sounds present 8. Lower extremities: no clubbing, cyanosis, no  edema 9. Neurologically Grossly intact, moving all 4 extremities equally   10. MSK: Normal range of motion    Chart has been reviewed  ______________________________________________________________________________________________  Assessment/Plan 74 y.o. female with medical history significant of type 1 diabetes on insulin  drip, dyslipidemia, CKD stage III, morbid obesity, depression, HTN, hypothyroidism   Admitted for   Diabetic ketoacidosis without coma associated with type 1 diabetes mellitus    Present on Admission:  DKA (diabetic ketoacidosis) (HCC)  Anemia  CAD (coronary artery disease), native coronary artery  Chronic kidney disease, stage 3a (HCC)  Diabetic ketoacidosis (HCC)  GERD (gastroesophageal reflux disease)  Hyperlipidemia  Hypothyroidism     Anemia Initial hemoglobin noted to be down to 6.8  this felt to be an error Will repeat Patient denies any melena no black stools no blood in stools Repeat HG is 10.7 which is stable  CAD (coronary artery disease), native coronary artery Stable continue Crestor  20 mg a day Patient currently on high-dose aspirin  for prophylaxis of DVT  Chronic kidney  disease, stage 3a (HCC)  -chronic avoid nephrotoxic medications such as NSAIDs, Vanco Zosyn  combo,  avoid hypotension, continue to follow renal function   Diabetic ketoacidosis (HCC) will admit per DKA   obtain serial BMET, start on glucosestabalizer, aggressive IVF.   Change IVF to D5 1/2Na after BG <250 .   Monitor in [place]. Replace potassium as needed.    DC insuline pump while on the drip.  Consult diabetes coordinator    Diastolic dysfunction History of chronic diastolic CHF Currently appears to be on the dry side will rehydrate and follow fluid status  GERD (gastroesophageal reflux disease) Continue Protonix   Hyperlipidemia Continue Crestor  20 mg a day  Hypothyroidism - Check TSH continue home medications Synthroid  at 150 mcg po q day   Other plan as per orders.  DVT prophylaxis:  SCD    Code Status:    Code Status: Prior FULL CODE   as per patient   I had personally discussed CODE STATUS with patient   ACP   none   Family Communication:   Family  t at  Bedside  plan of care was discussed on the phone with   Son,    Diet npo   Disposition Plan:       To home once workup is complete and patient is stable   Following barriers for discharge:                              Electrolytes corrected                                                         Pain controlled with PO medications                                   Consult Orders  (From admission, onward)           Start     Ordered   08/25/24 1554  Consult to hospitalist  Luke with cl called for consult  Once       Provider:  (Not yet assigned)  Question Answer Comment  Place call to: [facility]   Reason for Consult Admit      08/25/24 1553                              Diabetes care coordinator                    Consults called:    NONE   Admission status:  ED Disposition     ED Disposition  Admit   Condition  --   Comment  Hospital Area: [facility]  [[id number]]  Level of Care: Stepdown [14]  Admit to SDU based on following criteria: Severe physiological/psychological symptoms:  Any diagnosis requiring assessment & intervention at least  every 4 hours on an ongoing basis to obtain desired patient outcomes including stability and rehabilitation  May admit patient to [facility] or [facility] if equivalent level of care is available:: Yes  Interfacility transfer: Yes  Diagnosis: DKA (diabetic ketoacidosis) [facility]) [[id number]]  Admitting Physician: JACKEE ZEBEDEE BROCKS [[id number]]  Attending Physician: JACKEE ZEBEDEE BROCKS [[id number]]  Certification:: I certify this patient will need inpatient services for at least 2 midnights  Expected Medical Readiness: 08/27/2024            inpatient     I Expect 2 midnight stay secondary to severity of patient's current illness need for inpatient interventions justified by the following:  Severe lab/radiological/exam abnormalities including:    Diabetic ketoacidosis without coma associated with type 2 diabetes mellitus (HCC)    and extensive comorbidities including: DM1   CHF  CKD  That are currently affecting medical management.   I expect  patient to be hospitalized for 2 midnights requiring inpatient medical care.  Patient is at high risk for adverse outcome (such as loss of life or disability) if not treated.  Indication for inpatient stay as follows:   inability to maintain Morgan hydration     Need for  IV fluids,, IV pain medications, frequent labs    Level of care   stepdown     Winefred Hillesheim 08/25/2024, 8:51 PM    [facility]     after 2 AM please page floor coverage   If 7AM-7PM, please contact the day team taking care of the patient using [email or web address]        [1]  Allergies Allergen Reactions   Dilaudid  [Hydromorphone  Hcl] Other (See Comments)    Hallucinations/ argumentative, goes beserk   Heparin  Other (See Comments)    HIT ab positive  (SRA negative 11/25/17)    Ambien   [Zolpidem  Tartrate] Other (See Comments)    Up sleep walking and eating   Belsomra [Suvorexant]     ineffective Other reaction(s): Other ineffective   Codeine Nausea And Vomiting   Morphine  And Codeine Other (See Comments)    Flushing and feeling hot Tolerates with Diphenhydramine    Penicillins Rash and Other (See Comments)    Caused Headaches also Has patient had a PCN reaction causing immediate rash, facial/tongue/throat swelling, SOB or lightheadedness with hypotension: No Has patient had a PCN reaction causing severe rash involving mucus membranes or skin necrosis: No Has patient had a PCN reaction that required hospitalization No Has patient had a PCN reaction occurring within the last 10 years: No If all of the above answers are NO, then may proceed with Cephalosporin use.     Statins Other (See Comments)    Muscle weakness, cramps and aching all over body   Ceftin [Cefuroxime Axetil] Other (See Comments)    Stomach pain    Elavil [Amitriptyline] Other (See Comments)    Loopy feeling   Lovaza  [Omega-3-Acid  Ethyl Esters (Fish)] Other (See Comments)    Stomach pain    Lunesta [Eszopiclone] Other (See Comments)    Causes dizziness   Lyrica [Pregabalin] Nausea Only   Metformin  And Related Nausea Only   "

## 2024-08-25 NOTE — ED Notes (Signed)
 Kim with cl called for transport

## 2024-08-25 NOTE — Plan of Care (Signed)

## 2024-08-25 NOTE — Assessment & Plan Note (Signed)
 Continue Protonix 

## 2024-08-25 NOTE — Progress Notes (Signed)
 Patient is refusing labs for tonight. Alto, NP notified.  Verbal from Alto, NP to d/c q4 BMET/Beta labs. Will order both labs 4 hours after insulin  gtt is stopped and then move to daily labs.

## 2024-08-25 NOTE — ED Notes (Addendum)
 RN was in another room with a critical patient for past 40 minutes and unable to follow endotool protocols or repeat CBG prior to patient being transferred to inpatient bed via [facility].

## 2024-08-25 NOTE — Assessment & Plan Note (Signed)
-   Check TSH continue home medications Synthroid at po q day

## 2024-08-25 NOTE — ED Triage Notes (Signed)
 Pt reports hip replacement x1 week ago. Pt reports elevated blood sugar at 496 mg/dL. Pt reports taking 30 units of insulin . Pt also uses insulin  pump.

## 2024-08-25 NOTE — ED Notes (Signed)
 Patient would like more pain meds prior to getting ultrasound. Spoke to PA who will put in meds and nurse was informed. Nurse had to place IV so will call imaging when patient is ready for ultrasound.

## 2024-08-25 NOTE — ED Notes (Signed)
 Recd a call from Tori/sec @ ICU, states this pt may being going to [facility] instead of [facility], charge and pts nurse are aware and will await potential changes through bed placement

## 2024-08-25 NOTE — Assessment & Plan Note (Signed)
 History of chronic diastolic CHF Currently appears to be on the dry side will rehydrate and follow fluid status

## 2024-08-25 NOTE — ED Notes (Addendum)
 Conner - PA-C notified of pt's beta hydroxy acid results.

## 2024-08-25 NOTE — ED Notes (Signed)
 Attempted to call report for patient currently in route to [facility].  ICU staff informed me that they did not know about bed assignment and therefore did not have an RN available to take report.  Brittney RN stated she would consult with inpatient charge nurse and call back for report as needed.

## 2024-08-26 ENCOUNTER — Inpatient Hospital Stay (HOSPITAL_COMMUNITY)

## 2024-08-26 DIAGNOSIS — E1110 Type 2 diabetes mellitus with ketoacidosis without coma: Secondary | ICD-10-CM | POA: Diagnosis not present

## 2024-08-26 DIAGNOSIS — E1111 Type 2 diabetes mellitus with ketoacidosis with coma: Secondary | ICD-10-CM

## 2024-08-26 LAB — GLUCOSE, CAPILLARY
Glucose-Capillary: 120 mg/dL — ABNORMAL HIGH (ref 70–99)
Glucose-Capillary: 217 mg/dL — ABNORMAL HIGH (ref 70–99)
Glucose-Capillary: 304 mg/dL — ABNORMAL HIGH (ref 70–99)
Glucose-Capillary: 273 mg/dL — ABNORMAL HIGH (ref 70–99)
Glucose-Capillary: 232 mg/dL — ABNORMAL HIGH (ref 70–99)

## 2024-08-26 LAB — HEMOGLOBIN A1C
Hgb A1c MFr Bld: 7.9 % — ABNORMAL HIGH (ref 4.8–5.6)
Mean Plasma Glucose: 180.03 mg/dL

## 2024-08-26 MED ORDER — OXYCODONE-ACETAMINOPHEN 5-325 MG PO TABS
1.0000 | ORAL_TABLET | ORAL | Status: DC | PRN
  Administered 2024-08-26 (×2): 1 via ORAL
  Administered 2024-08-27: 2 via ORAL
  Administered 2024-08-27: 1 via ORAL
  Filled 2024-08-26: qty 1
  Filled 2024-08-26: qty 2
  Filled 2024-08-26 (×2): qty 1

## 2024-08-26 MED ORDER — CHLORHEXIDINE GLUCONATE CLOTH 2 % EX PADS: 6.0000 | MEDICATED_PAD | Freq: Every day | CUTANEOUS | Status: DC

## 2024-08-26 MED ORDER — INSULIN GLARGINE 100 UNIT/ML ~~LOC~~ SOLN
25.0000 [IU] | Freq: Every day | SUBCUTANEOUS | Status: DC
  Administered 2024-08-26: 25 [IU] via SUBCUTANEOUS
  Filled 2024-08-26 (×2): qty 0.25

## 2024-08-26 MED ORDER — INSULIN GLARGINE 100 UNIT/ML ~~LOC~~ SOLN
10.0000 [IU] | Freq: Every day | SUBCUTANEOUS | Status: DC
  Administered 2024-08-26: 10 [IU] via SUBCUTANEOUS
  Filled 2024-08-26 (×2): qty 0.1

## 2024-08-26 MED ORDER — INSULIN GLARGINE 100 UNIT/ML ~~LOC~~ SOLN
5.0000 [IU] | Freq: Once | SUBCUTANEOUS | Status: DC
  Filled 2024-08-26: qty 0.05

## 2024-08-26 MED ORDER — INSULIN ASPART 100 UNIT/ML IJ SOLN
3.0000 [IU] | Freq: Three times a day (TID) | INTRAMUSCULAR | Status: DC
  Administered 2024-08-26 – 2024-08-27 (×3): 3 [IU] via SUBCUTANEOUS
  Filled 2024-08-26 (×3): qty 3

## 2024-08-26 NOTE — Progress Notes (Signed)
 " PROGRESS NOTE  [name] FMW:[id number] DOB: Jul 07, 1951 DOA: 08/25/2024 PCP: [name], MD   LOS: 1 day   Brief Narrative / Interim history: 74 year old female with DM1 on insulin  pump, hypertension, hypothyroidism comes into the hospital with left hip/leg pain as well as high blood sugars.  She underwent a left hip replacement about a week ago, tells me she did well initially, worked with physical therapy when they came home but over the last several days has been experiencing severe left hip and leg pain.  She also noticed that, at the same time, her sugars have been really high and unable to be controlled.  She decided to come to the emergency room where she was found to have mild DKA and started on insulin  drip  Subjective / 24h Interval events: She is feeling better this morning, hip pain is controlled, and she is adamant about not having any further blood work while here  Assesement and Plan: Principal problem DM1, mild DKA -patient was initially admitted to stepdown, placed on insulin  infusion.  Labs normalized this morning, she was transition to subcutaneous insulin , will advance diet when she is ready to come out of the ICU - Still with intermittent hyperglycemia, her pump apparently is not working and battery is dead.  Son is working to get her battery replaced, hopefully by tomorrow morning  Active problems Status post left hip replacement - 7 days ago, she has been experiencing worsening pain at home.  Plain films without any complicating features.  She was able to work well with physical therapy today  CAD-continue Crestor , continue aspirin .  No chest pain  CKD 3A-creatinine appears at baseline.  She is refusing further blood work  Hyperlipidemia-continue Crestor   Hypothyroidism-continue Synthroid , last TSH about 6 years ago, would have like to recheck a TSH but she is refusing blood work.  She does not have any particular symptoms related to this, defer to  outpatient  Anemia-of chronic disease, hemoglobin 10.7  Essential hypertension-continue amlodipine , metoprolol   Scheduled Meds:  amLODipine   2.5 mg Oral Daily   aspirin  EC  243 mg Oral BID   Chlorhexidine  Gluconate Cloth  6 each Topical Daily   docusate sodium   100 mg Oral BID   imipramine   50 mg Oral QHS   insulin  aspart  0-15 Units Subcutaneous TID WC   insulin  aspart  0-5 Units Subcutaneous QHS   insulin  aspart  3 Units Subcutaneous TID WC   insulin  glargine  25 Units Subcutaneous QHS   insulin  glargine-yfgn  5 Units Subcutaneous Once   levothyroxine   150 mcg Oral QAC breakfast   metoprolol  succinate  25 mg Oral Daily   pantoprazole   40 mg Oral Daily   rosuvastatin   20 mg Oral q AM   Continuous Infusions: PRN Meds:.dextrose , diphenhydrAMINE , escitalopram , methocarbamol , morphine  injection, oxyCODONE -acetaminophen   Current Outpatient Medications  Medication Instructions   acetaminophen  (TYLENOL ) 1,000 mg, Oral, Every 8 hours PRN   amLODipine  (NORVASC ) 5 mg, Oral, Daily   [Paused] aspirin  EC 243 mg, Oral, 2 times daily, **Takes this way until 09/16/2024 then go back to 1 tablet daily.   cetirizine (ZYRTEC) 10 mg, Oral, Daily   Clobetasol  Propionate (TEMOVATE ) 0.05 % external spray 1 spray, Topical, At bedtime PRN   cyanocobalamin  (VITAMIN B12) 1,000 mcg, Oral, Every morning   docusate sodium  (COLACE) 100 mg, Oral, 2 times daily   doxycycline  (VIBRAMYCIN ) 50 mg, Oral, Daily   escitalopram  (LEXAPRO ) 5 mg, Oral, Daily PRN   Fluocinolone Acetonide 0.01 %  OIL 5 drops, OTIC (EAR), Daily PRN   gabapentin  (NEURONTIN ) 900 mg, Oral, Daily at bedtime   HumaLOG  0-110 Units, Subcutaneous, Continuous PRN   imipramine  (TOFRANIL ) 50 mg, Oral, Daily   irbesartan -hydrochlorothiazide  (AVALIDE) 300-12.5 MG tablet 1 tablet, Oral, Every morning   levothyroxine  (SYNTHROID ) 150 mcg, Oral, Daily before breakfast   LORazepam  (ATIVAN ) 0.5-1 mg, Oral, Every 8 hours PRN   Magnesium  250 mg, Oral, Every  morning   methocarbamol  (ROBAXIN ) 750 mg, Oral, Every 8 hours PRN   metoprolol  succinate (TOPROL -XL) 25 mg, Oral, Daily   metroNIDAZOLE (METROCREAM) 0.75 % cream 1 Application, Topical, 2 times daily   omeprazole (PRILOSEC) 40 mg, Oral, Daily   oxyCODONE -acetaminophen  (PERCOCET/ROXICET) 5-325 MG tablet 1 tablet, Oral, Every 4 hours PRN   Potassium 99 mg, Oral, Every morning   Probiotic Product (PROBIOTIC PO) 1 capsule, Oral, Every morning   promethazine  (PHENERGAN ) 25 mg, Oral, Every 8 hours PRN   rosuvastatin  (CRESTOR ) 20 mg, Oral, Every morning   tiZANidine  (ZANAFLEX ) 4 mg, Oral, Every 6 hours PRN   Vitamin D3 5,000 Units, Oral, Every morning   zinc  sulfate (50mg  elemental zinc ) 220 mg, Oral, Every morning    Diet Orders (From admission, onward)     Start     Ordered   08/25/24 2359  Diet Carb Modified Room service appropriate? Yes  Diet effective now       Question Answer Comment  Diet-HS Snack? Nothing   Calorie Level Medium 1600-2000   Fluid consistency: Thin   Room service appropriate? Yes      08/25/24 2358            DVT prophylaxis: SCDs Start: 08/25/24 1937   Lab Results  Component Value Date   PLT 262 08/25/2024      Code Status: Full Code  Family Communication: No family at bedside  Level of care: Med-Surg  Consultants:  none  Objective: Vitals:   08/26/24 0600 08/26/24 0700 08/26/24 0720 08/26/24 1010  BP: (!) 126/56 (!) 139/59  (!) 127/53  Pulse: 90 90  89  Resp: (!) 27 11  16   Temp:   98.4 F (36.9 C) 98.6 F (37 C)  TempSrc:   Oral Oral  SpO2: 91% 98%  96%  Weight:      Height:        Intake/Output Summary (Last 24 hours) at 08/26/2024 1335 Last data filed at 08/26/2024 1022 Gross per 24 hour  Intake 3667.73 ml  Output 500 ml  Net 3167.73 ml   Wt Readings from Last 3 Encounters:  08/25/24 86.2 kg  08/19/24 86.2 kg  08/12/24 87.5 kg    Examination:  Constitutional: NAD Eyes: no scleral icterus ENMT: Mucous membranes are  moist.  Neck: normal, supple Respiratory: clear to auscultation bilaterally, no wheezing, no crackles. Normal respiratory effort. No accessory muscle use.  Cardiovascular: Regular rate and rhythm, no murmurs / rubs / gallops. No LE edema.  Abdomen: non distended, no tenderness. Bowel sounds positive.  Musculoskeletal: no clubbing / cyanosis.  Skin: no rashes Neurologic: non focal   Data Reviewed: I have independently reviewed following labs and imaging studies   CBC Recent Labs  Lab 08/25/24 1236 08/25/24 1318 08/25/24 2009  WBC 7.6  --  6.3  HGB 10.9* 6.8* 10.7*  HCT 32.4* 20.0* 32.7*  PLT 319  --  262  MCV 80.4  --  81.1  MCH 27.0  --  26.6  MCHC 33.6  --  32.7  RDW 13.5  --  13.5  LYMPHSABS  --   --  1.4  MONOABS  --   --  0.5  EOSABS  --   --  0.2  BASOSABS  --   --  0.0    Recent Labs  Lab 08/25/24 1236 08/25/24 1318 08/25/24 1538 08/25/24 2009 08/25/24 2228  NA 132* 142 134* 137  --   K 4.4 2.5* 4.0 3.8  --   CL 91*  --  97* 100  --   CO2 19*  --  23 25  --   GLUCOSE 435*  --  264* 173*  --   BUN 30*  --  27* 23  --   CREATININE 1.39*  --  1.17* 1.02*  --   CALCIUM  10.1  --  9.2 9.2  --   AST  --   --   --  15  --   ALT  --   --   --  9  --   ALKPHOS  --   --   --  92  --   BILITOT  --   --   --  0.5  --   ALBUMIN   --   --   --  3.3*  --   MG  --   --   --  1.8  --   LATICACIDVEN  --   --   --  1.9 1.8  HGBA1C  --   --   --  7.9*  --     ------------------------------------------------------------------------------------------------------------------ No results for input(s): CHOL, HDL, LDLCALC, TRIG, CHOLHDL, LDLDIRECT in the last 72 hours.  Lab Results  Component Value Date   HGBA1C 7.9 (H) 08/25/2024   ------------------------------------------------------------------------------------------------------------------ No results for input(s): TSH, T4TOTAL, T3FREE, THYROIDAB in the last 72 hours.  Invalid input(s):  FREET3  Cardiac Enzymes No results for input(s): CKMB, TROPONINI, MYOGLOBIN in the last 168 hours.  Invalid input(s): CK ------------------------------------------------------------------------------------------------------------------    Component Value Date/Time   BNP 1,406.0 (H) 11/18/2017 1528    CBG: Recent Labs  Lab 08/25/24 2150 08/25/24 2349 08/26/24 0150 08/26/24 0728 08/26/24 1136  GLUCAP 144* 160* 120* 217* 304*    No results found for this or any previous visit (from the past 240 hours).   Radiology Studies: DG HIP UNILAT WITH PELVIS 1V LEFT Result Date: 08/26/2024 CLINICAL DATA:  Pain. EXAM: DG HIP (WITH OR WITHOUT PELVIS) 1V*L* COMPARISON:  None Available. FINDINGS: No evidence for an acute fracture. SI joints and symphysis pubis unremarkable. AP view of the left hip shows total hip arthroplasty without complicating features. IMPRESSION: 1. No acute bony findings. 2. Left total hip arthroplasty without complicating features. Electronically Signed   By: Camellia Candle M.D.   On: 08/26/2024 08:25   US  Venous Img Lower Unilateral Left Result Date: 08/25/2024 CLINICAL DATA:  74 year old female with left lower extremity pain. History of prior deep vein thrombosis. EXAM: LEFT LOWER EXTREMITY VENOUS DOPPLER ULTRASOUND TECHNIQUE: Gray-scale sonography with graded compression, as well as color Doppler and duplex ultrasound were performed to evaluate the left lower extremity deep venous systems from the level of the common femoral vein and including the common femoral, femoral, profunda femoral, popliteal and calf veins including the posterior tibial, peroneal and gastrocnemius veins when visible. Spectral Doppler was utilized to evaluate flow at rest and with distal augmentation maneuvers in the common femoral, femoral and popliteal veins. The contralateral common femoral vein was also evaluated for comparison. COMPARISON:  None Available. FINDINGS: LEFT LOWER EXTREMITY  Common Femoral  Vein: Small focal echogenic, wall adherent, chronic appearing thrombus is present. No evidence of acute thrombus. Normal compressibility, respiratory phasicity and response to augmentation. Central Greater Saphenous Vein: No evidence of thrombus. Normal compressibility and flow on color Doppler imaging. Central Profunda Femoral Vein: No evidence of thrombus. Normal compressibility and flow on color Doppler imaging. Femoral Vein: No evidence of thrombus. Normal compressibility, respiratory phasicity and response to augmentation. Popliteal Vein: No evidence of thrombus. Normal compressibility, respiratory phasicity and response to augmentation. Calf Veins: No evidence of thrombus. Normal compressibility and flow on color Doppler imaging. Other Findings:  None. RIGHT LOWER EXTREMITY Common Femoral Vein: No evidence of thrombus. Normal compressibility, respiratory phasicity and response to augmentation. IMPRESSION: No evidence of acute left lower extremity deep venous thrombosis. Small focal synechaie about the common femoral vein, likely sequela prior deep vein thrombosis. Ester Sides, MD [facility] Electronically Signed   By: Ester Sides M.D.   On: 08/25/2024 15:25     Nilda Fendt, MD, PhD [facility]  Between 7 am - 7 pm I am available, please contact me via Amion (for emergencies) or Securechat (non urgent messages)  Between 7 pm - 7 am I am not available, please contact night coverage MD/APP via Amion  "

## 2024-08-26 NOTE — Evaluation (Addendum)
 Physical Therapy Evaluation Patient Details Name: [name] MRN: [id number] DOB: 08/20/1950 Today's Date: 08/26/2024  History of Present Illness  74 yo female Presented with hyperglycemia and left leg pain.Dx of DKA. s/p L THA, anterior approach on 08/19/2024 due to failure of conservative measures. Pt is currently L LE WBAT with anterior hip precautions-- avoid hyperextension, external rotation and abduction. 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 Pt reports she was progressing well with mobility since her L THA 1 week ago, she was ambulating with a RW at home. She'd gone to 1 outpatient PT session. Today pt ambulated 180' with RW, no loss of balance. She performed L THA ROM exercises which she tolerated well.  Pt currently with functional limitations due to the deficits listed below (see PT Problem List). Pt will benefit from acute skilled PT to increase their independence and safety with mobility to allow discharge.            If plan is discharge home, recommend the following: A little help with bathing/dressing/bathroom;A little help with walking and/or transfers;Assistance with cooking/housework;Help with stairs or ramp for entrance   Can travel by private vehicle        Equipment Recommendations None recommended by PT  Recommendations for Other Services       Functional Status Assessment Patient has had a recent decline in their functional status and demonstrates the ability to make significant improvements in function in a reasonable and predictable amount of  time.     Precautions / Restrictions Precautions Precautions: Fall Precaution/Restrictions Comments: Per ortho note: NO hip precautions Restrictions LLE Weight Bearing Per Provider Order: Weight bearing as tolerated      Mobility  Bed Mobility Overal bed mobility: Modified Independent Bed Mobility: Supine to Sit     Supine to sit: Modified independent (Device/Increase time), Used rails, HOB elevated          Transfers Overall transfer level: Needs assistance Equipment used: Rolling walker (2 wheels) Transfers: Sit to/from Stand Sit to Stand: Supervision           General transfer comment: Good recall of hand placement    Ambulation/Gait Ambulation/Gait assistance: Supervision Gait Distance (Feet): 180 Feet Assistive device: Rolling walker (2 wheels) Gait Pattern/deviations: Step-through pattern Gait velocity: functional     General Gait Details: steady, no loss of balance, distance limited by fatigue, 1 standing rest break  Stairs            Wheelchair Mobility     Tilt Bed    Modified Rankin (Stroke Patients Only)       Balance     Sitting balance-Leahy Scale: Good     Standing balance support: Bilateral upper extremity supported, No upper extremity supported Standing balance-Leahy Scale: Fair                               Pertinent Vitals/Pain Pain Assessment Pain Assessment: 0-10 Pain Score: 4  Pain Location: L hip Pain Descriptors / Indicators: Discomfort, Sore Pain Intervention(s): Limited activity within patient's tolerance, Monitored during session, Premedicated before session,  Repositioned, Ice applied    Home Living Family/patient expects to be discharged to:: Private residence Living Arrangements: Spouse/significant other Available Help at Discharge: Family;Available 24 hours/day (spouse and son to assist) Type of Home: House Home Access: Stairs to enter   [employer] of Steps: 3 onto porch with  rail on the left and 1 into house   Home Layout: One level Home Equipment: Agricultural Consultant (2 wheels);Cane - single point;Rollator (4 wheels);Shower seat - built in      Prior Function Prior Level of Function : Independent/Modified Independent;Driving             Mobility Comments: walks with RW since THA 08/19/24; 2 falls in past 6 months most recently 07/27/24; no falls since [facility] 08/19/24       Extremity/Trunk Assessment   Upper Extremity Assessment Upper Extremity Assessment: Overall WFL for tasks assessed    Lower Extremity Assessment LLE Deficits / Details: hip AAROM WFL, hip strength at least 3/5    Cervical / Trunk Assessment Cervical / Trunk Assessment: Normal  Communication        Cognition Arousal: Alert Behavior During Therapy: WFL for tasks assessed/performed   PT - Cognitive impairments: No apparent impairments                                 Cueing       General Comments      Exercises Total Joint Exercises Ankle Circles/Pumps: AROM, Both, 10 reps, Supine Heel Slides: AAROM, Left, Supine, 10 reps, AROM Hip ABduction/ADduction: Supine, AROM, Left, 10 reps   Assessment/Plan    PT Assessment Patient needs continued PT services  PT Problem List Decreased activity tolerance;Decreased balance;Decreased mobility       PT Treatment Interventions DME instruction;Therapeutic exercise;Gait training;Stair training;Functional mobility training;Therapeutic activities;Patient/family education;Modalities;Balance training    PT Goals (Current goals can be found in the Care Plan section)  Acute Rehab PT Goals Patient Stated Goal: go thrifting with 74 year old granddaughter PT Goal Formulation: With patient Time For Goal Achievement: 09/09/24 Potential to Achieve Goals: Good    Frequency Min 4X/week     Co-evaluation               AM-PAC PT 6 Clicks Mobility  Outcome Measure Help needed turning from your back to your side while in  a flat bed without using bedrails?: None Help needed moving from lying on your back to sitting on the side of a flat bed without using bedrails?: A Little Help needed moving to and from a bed to a chair (including a wheelchair)?: None Help needed standing up from a chair using your arms (e.g., wheelchair or bedside chair)?: None Help needed to walk in hospital room?: None Help needed climbing 3-5 steps with a railing? : A Little 6 Click Score: 22    End of Session Equipment Utilized During Treatment: Gait belt Activity Tolerance: Patient tolerated treatment well Patient left: with call bell/phone within reach;in chair Nurse Communication: Mobility status PT Visit Diagnosis: Other abnormalities of gait and mobility (R26.89);Muscle weakness (generalized) (M62.81)    Time: 9066-9040 PT Time Calculation (min) (ACUTE ONLY): 26 min   Charges:   PT Evaluation $PT Eval Moderate Complexity: 1 Mod PT Treatments $Gait Training: 8-22 mins PT General Charges $$ ACUTE PT VISIT: 1 Visit         Sylvan Delon Copp PT 08/26/2024  Acute Rehabilitation Services  Office [phone]

## 2024-08-26 NOTE — Inpatient Diabetes Management (Signed)
 Inpatient Diabetes Program Recommendations  AACE/ADA: New Consensus Statement on Inpatient Glycemic Control (2015)  Target Ranges:  Prepandial:   less than 140 mg/dL      Peak postprandial:   less than 180 mg/dL (1-2 hours)      Critically ill patients:  140 - 180 mg/dL   Lab Results  Component Value Date   GLUCAP 304 (H) 08/26/2024   HGBA1C 7.9 (H) 08/25/2024    Review of Glycemic Control  Diabetes history: DM 1 Outpatient Diabetes medications: Medtronic pump Current orders for Inpatient glycemic control: Lantus  10 at bedtime, Novolog  0-15 TID with meals and 0-5 HS  HgbA1C - 7.9%  Spoke with Harlene, NP regarding pump settings. Harlene recommended restarting pump. Son states he will bring it up here tomorrow morning with her battery and consulting civil engineer. States she was on approx 40 units of Lantus  daily, with CF 1:30 and CR 1:10 Goal is 100 mg/dL.  Inpatient Diabetes Program Recommendations:    Increase Lantus  to 20 units (now) which will equal 30 units total for today.  Add Novolog  6 units TID with meals if eating > 50%  F/U in am.   Thank you. Shona Brandy, RD, LDN, CDCES Inpatient Diabetes Coordinator [phone]

## 2024-08-27 ENCOUNTER — Other Ambulatory Visit (HOSPITAL_COMMUNITY): Payer: Self-pay

## 2024-08-27 DIAGNOSIS — E1110 Type 2 diabetes mellitus with ketoacidosis without coma: Secondary | ICD-10-CM | POA: Diagnosis not present

## 2024-08-27 LAB — GLUCOSE, CAPILLARY
Glucose-Capillary: 208 mg/dL — ABNORMAL HIGH (ref 70–99)
Glucose-Capillary: 248 mg/dL — ABNORMAL HIGH (ref 70–99)

## 2024-08-27 MED ORDER — OXYCODONE-ACETAMINOPHEN 5-325 MG PO TABS
1.0000 | ORAL_TABLET | ORAL | 0 refills | Status: AC | PRN
  Filled 2024-08-27: qty 10, 1d supply, fill #0

## 2024-08-27 MED ORDER — BACLOFEN 10 MG PO TABS
10.0000 mg | ORAL_TABLET | Freq: Three times a day (TID) | ORAL | Status: DC | PRN
  Administered 2024-08-27: 10 mg via ORAL
  Filled 2024-08-27: qty 1

## 2024-08-27 MED ORDER — BACLOFEN 10 MG PO TABS
10.0000 mg | ORAL_TABLET | Freq: Three times a day (TID) | ORAL | 0 refills | Status: AC | PRN
  Filled 2024-08-27: qty 30, 10d supply, fill #0

## 2024-08-27 NOTE — Consult Note (Signed)
 Reason for Consult:Left hip pain Referring Physician: Hospitalists  [name] is an 74 y.o. female.  HPI: Patient is well known to me as I did a total hip replacement on her approximately 8 days ago.  She was readmitted in diabetic ketoacidosis and complaining of severe left hip pain.  She has been treated well by the hospitalist service and is doing much better in terms of left hip pain as well.  We are consult it for evaluation.  Past Medical History:  Diagnosis Date   Anxiety    Blood transfusion    CAD (coronary artery disease), native coronary artery    coronary CTA showed a coronary Ca score of 506, mild CAD with 25-49% mRCA and moderate CAD with 50-69% sequential mRCA, 25-49% inferior sub branch of D1 and 25-49% in pLCx.     Cervical spondylosis with myelopathy and radiculopathy    CVA (cerebral vascular accident) (HCC)    11/2017 - no weakness or paralysis   Depression    Diabetes mellitus    Type 1   Diastolic dysfunction    Diverticulitis    DVT (deep venous thrombosis) (HCC)    times 2 lower leg   Endometriosis    Fibromyalgia    Headache    History of kidney stones    Hyperlipidemia    Hypertension    Hypothyroidism    Mild aortic insufficiency    by echo 04/2013   Osteoarthritis    back and knee   Ovarian cyst    PONV (postoperative nausea and vomiting)    RLS (restless legs syndrome)    Thoracic compression fracture (HCC)    Villous adenoma of right colon 04/08/2016   Vitamin D  deficiency disease     Past Surgical History:  Procedure Laterality Date   ANTERIOR CERVICAL DECOMP/DISCECTOMY FUSION N/A 03/23/2017   Procedure: ANTERIOR CERVICAL DECOMPRESSION/DISCECTOMY FUSION, INTERBODY PROSTHESIS,PLATE CERVICAL THREE- CERVICAL FOUR, CERVICAL FOUR- CERVICAL FIVE, CERVICAL FIVE- CERVICAL SIX;  Surgeon: Mavis Purchase, MD;  Location: [facility] OR;  Service: Neurosurgery;  Laterality: N/A;   ANTERIOR CERVICAL DECOMP/DISCECTOMY FUSION N/A 04/18/2019   Procedure:  ANTERIOR CERVICAL DECOMPRESSION/DISCECTOMY  CERVICAL SIX- CERVICAL SEVEN;  Surgeon: Mavis Purchase, MD;  Location: [facility] OR;  Service: Neurosurgery;  Laterality: N/A;  anterior   APPENDECTOMY     BACK SURGERY  2005   April  2013 - spinal fusion@ [facility]   bowel blockage surgery     CARDIAC CATHETERIZATION  04/2013   normal coronary arteries and normal LVF   CARPAL TUNNEL RELEASE  06/05/2012   Procedure: CARPAL TUNNEL RELEASE;  Surgeon: Lamar LULLA Leonor Mickey., MD;  Location: [facility];  Service: Orthopedics;  Laterality: Left;   CESAREAN SECTION     x 2   CHOLECYSTECTOMY     COLECTOMY  04/08/2016   DILATION AND CURETTAGE OF UTERUS     DORSAL COMPARTMENT RELEASE  06/05/2012   Procedure: RELEASE DORSAL COMPARTMENT (DEQUERVAIN);  Surgeon: Lamar LULLA Leonor Mickey., MD;  Location: [facility];  Service: Orthopedics;  Laterality: Left;  Excision of mixoid cyst also   EYE SURGERY     cataracts bilateral   INCISIONAL HERNIA REPAIR N/A 05/18/2022   Procedure: REPAIR INCISIONAL HERNIA WITH MESH;  Surgeon: Vanderbilt Ned, MD;  Location: [facility] OR;  Service: General;  Laterality: N/A;   JOINT REPLACEMENT     Bilateral knee   KNEE ARTHROPLASTY  2009   lft partial   KNEE ARTHROPLASTY     rt  LAPAROSCOPIC PARTIAL COLECTOMY N/A 04/08/2016   Procedure: LAPAROSCOPIC ASSISTED ASCENDING COLECTOMY POSSIBLE OPEN COLECTOMY;  Surgeon: Elon Pacini, MD;  Location: [facility] OR;  Service: General;  Laterality: N/A;   orthopedic surgeries     multiple   perforated bowl     Sep 09 2020   REDUCTION MAMMAPLASTY Bilateral    SHOULDER OPEN ROTATOR CUFF REPAIR     rt and lft   TEE WITHOUT CARDIOVERSION N/A 11/23/2017   Procedure: TRANSESOPHAGEAL ECHOCARDIOGRAM (TEE);  Surgeon: Jeffrie Oneil BROCKS, MD;  Location: [facility] ENDOSCOPY;  Service: Cardiovascular;  Laterality: N/A;   TOTAL HIP ARTHROPLASTY Left 08/19/2024   Procedure: ARTHROPLASTY, HIP, TOTAL, ANTERIOR APPROACH;  Surgeon: Yvone Rush, MD;  Location: [facility]  ORS;  Service: Orthopedics;  Laterality: Left;   TUBAL LIGATION     btsp    Family History  Problem Relation Age of Onset   Heart attack Father        36s   Hypothyroidism Father    Diabetes type II Father    Kidney failure Father    Diabetes type II Mother    Hypertension Mother    Breast cancer Mother 75   Dementia Mother    Hypothyroidism Brother    Diabetes Brother    Breast cancer Maternal Aunt    Breast cancer Cousin    Breast cancer Maternal Aunt    Breast cancer Cousin    High blood pressure Other        all   Heart attack Brother    Lung cancer Brother     Social History:  reports that she has never smoked. She has never used smokeless tobacco. She reports that she does not drink alcohol and does not use drugs.  Allergies: Allergies[1]  Medications: I have reviewed the patient's current medications.  Results for orders placed or performed during the hospital encounter of 08/25/24 (from the past 48 hours)  CBG monitoring, ED     Status: Abnormal   Collection Time: 08/25/24 12:21 PM  Result Value Ref Range   Glucose-Capillary 420 (H) 70 - 99 mg/dL    Comment: Glucose reference range applies only to samples taken after fasting for at least 8 hours.  Basic metabolic panel     Status: Abnormal   Collection Time: 08/25/24 12:36 PM  Result Value Ref Range   Sodium 132 (L) 135 - 145 mmol/L    Comment: Electrolytes repeated to verify    Potassium 4.4 3.5 - 5.1 mmol/L    Comment: HEMOLYSIS AT THIS LEVEL MAY AFFECT RESULT   Chloride 91 (L) 98 - 111 mmol/L   CO2 19 (L) 22 - 32 mmol/L   Glucose, Bld 435 (H) 70 - 99 mg/dL    Comment: Glucose reference range applies only to samples taken after fasting for at least 8 hours.   BUN 30 (H) 8 - 23 mg/dL   Creatinine, Ser 8.60 (H) 0.44 - 1.00 mg/dL   Calcium  10.1 8.9 - 10.3 mg/dL   GFR, Estimated 40 (L) >60 mL/min    Comment: (NOTE) Calculated using the CKD-EPI Creatinine Equation (2021)    Anion gap 22 (H) 5 - 15     Comment: Performed at [facility], [address] [place], [place] [address]  CBC     Status: Abnormal   Collection Time: 08/25/24 12:36 PM  Result Value Ref Range   WBC 7.6 4.0 - 10.5 K/uL   RBC 4.03 3.87 - 5.11 MIL/uL   Hemoglobin 10.9 (L) 12.0 -  15.0 g/dL   HCT 67.5 (L) 63.9 - 53.9 %   MCV 80.4 80.0 - 100.0 fL   MCH 27.0 26.0 - 34.0 pg   MCHC 33.6 30.0 - 36.0 g/dL   RDW 86.4 88.4 - 84.4 %   Platelets 319 150 - 400 K/uL   nRBC 0.0 0.0 - 0.2 %    Comment: Performed at [facility], [address], [place], [place] [address]  Urinalysis, Routine w reflex microscopic -Urine, Clean Catch     Status: Abnormal   Collection Time: 08/25/24 12:36 PM  Result Value Ref Range   Color, Urine YELLOW YELLOW   APPearance CLEAR CLEAR   Specific Gravity, Urine 1.023 1.005 - 1.030   pH 5.0 5.0 - 8.0   Glucose, UA >1,000 (A) NEGATIVE mg/dL   Hgb urine dipstick NEGATIVE NEGATIVE   Bilirubin Urine NEGATIVE NEGATIVE   Ketones, ur 40 (A) NEGATIVE mg/dL   Protein, ur NEGATIVE NEGATIVE mg/dL   Nitrite NEGATIVE NEGATIVE   Leukocytes,Ua MODERATE (A) NEGATIVE   RBC / HPF 0-5 0 - 5 RBC/hpf   WBC, UA 11-20 0 - 5 WBC/hpf   Bacteria, UA NONE SEEN NONE SEEN   Squamous Epithelial / HPF 0-5 0 - 5 /HPF   Mucus PRESENT    Hyaline Casts, UA PRESENT     Comment: Performed at [facility], [address], [place], [place] [address]  Beta-hydroxybutyric acid     Status: Abnormal   Collection Time: 08/25/24 12:36 PM  Result Value Ref Range   Beta-Hydroxybutyric Acid 4.52 (H) 0.05 - 0.27 mmol/L    Comment: Results confirmed by manual dilution  (NOTE) This is a modified FDA-approved test that has been validated and its performance characteristics determined by the reporting laboratory. This laboratory is certified under the Clinical Laboratory Improvement Amendments (CLIA) as qualified to perform high complexity clinical laboratory  testing.  Performed at [facility], [address], [place], [place] [address]   I-Stat venous blood gas, [facility] ED, [facility], [facility])     Status: Abnormal   Collection Time: 08/25/24  1:18 PM  Result Value Ref Range   pH, Ven 7.399 7.25 - 7.43   pCO2, Ven 17.9 (LL) 44 - 60 mmHg   pO2, Ven 19 (LL) 32 - 45 mmHg   Bicarbonate 11.1 (L) 20.0 - 28.0 mmol/L   TCO2 12 (L) 22 - 32 mmol/L   O2 Saturation 33 %   Acid-base deficit 13.0 (H) 0.0 - 2.0 mmol/L   Sodium 142 135 - 145 mmol/L   Potassium 2.5 (LL) 3.5 - 5.1 mmol/L   Calcium , Ion 0.94 (L) 1.15 - 1.40 mmol/L   HCT 20.0 (L) 36.0 - 46.0 %   Hemoglobin 6.8 (LL) 12.0 - 15.0 g/dL   Patient temperature 02.3 F    Collection site IV start    Drawn by HIDE    Sample type VENOUS    Comment NOTIFIED PHYSICIAN   CBG monitoring, ED     Status: Abnormal   Collection Time: 08/25/24  2:18 PM  Result Value Ref Range   Glucose-Capillary 356 (H) 70 - 99 mg/dL    Comment: Glucose reference range applies only to samples taken after fasting for at least 8 hours.  CBG monitoring, ED     Status: Abnormal   Collection Time: 08/25/24  3:30 PM  Result Value Ref Range   Glucose-Capillary 289 (H) 70 - 99 mg/dL    Comment: Glucose reference range applies only to samples  taken after fasting for at least 8 hours.  Basic metabolic panel     Status: Abnormal   Collection Time: 08/25/24  3:38 PM  Result Value Ref Range   Sodium 134 (L) 135 - 145 mmol/L   Potassium 4.0 3.5 - 5.1 mmol/L   Chloride 97 (L) 98 - 111 mmol/L   CO2 23 22 - 32 mmol/L   Glucose, Bld 264 (H) 70 - 99 mg/dL    Comment: Glucose reference range applies only to samples taken after fasting for at least 8 hours.   BUN 27 (H) 8 - 23 mg/dL   Creatinine, Ser 8.82 (H) 0.44 - 1.00 mg/dL   Calcium  9.2 8.9 - 10.3 mg/dL   GFR, Estimated 49 (L) >60 mL/min    Comment: (NOTE) Calculated using the CKD-EPI Creatinine Equation (2021)    Anion gap 14 5 - 15    Comment: Performed at [facility], [address], [place], [place] [address]  CBG monitoring, ED     Status: Abnormal   Collection Time: 08/25/24  4:29 PM  Result Value Ref Range   Glucose-Capillary 211 (H) 70 - 99 mg/dL    Comment: Glucose reference range applies only to samples taken after fasting for at least 8 hours.  Glucose, capillary     Status: Abnormal   Collection Time: 08/25/24  6:23 PM  Result Value Ref Range   Glucose-Capillary 205 (H) 70 - 99 mg/dL    Comment: Glucose reference range applies only to samples taken after fasting for at least 8 hours.  Glucose, capillary     Status: Abnormal   Collection Time: 08/25/24  7:28 PM  Result Value Ref Range   Glucose-Capillary 159 (H) 70 - 99 mg/dL    Comment: Glucose reference range applies only to samples taken after fasting for at least 8 hours.  Basic metabolic panel     Status: Abnormal   Collection Time: 08/25/24  8:09 PM  Result Value Ref Range   Sodium 137 135 - 145 mmol/L   Potassium 3.8 3.5 - 5.1 mmol/L   Chloride 100 98 - 111 mmol/L   CO2 25 22 - 32 mmol/L   Glucose, Bld 173 (H) 70 - 99 mg/dL    Comment: Glucose reference range applies only to samples taken after fasting for at least 8 hours.   BUN 23 8 - 23 mg/dL   Creatinine, Ser 8.97 (H) 0.44 - 1.00 mg/dL   Calcium  9.2 8.9 - 10.3 mg/dL   GFR, Estimated 57 (L) >60 mL/min    Comment: (NOTE) Calculated using the CKD-EPI Creatinine Equation (2021)    Anion gap 11 5 - 15    Comment: Performed at [facility], [address] [place], [place] [address]  Beta-hydroxybutyric acid     Status: Abnormal   Collection Time: 08/25/24  8:09 PM  Result Value Ref Range   Beta-Hydroxybutyric Acid 0.92 (H) 0.05 - 0.27 mmol/L    Comment: (NOTE) This is a modified FDA-approved test that has been validated and its performance characteristics determined by the reporting laboratory. This laboratory is certified under the Clinical Laboratory Improvement Amendments (CLIA) as  qualified to perform high complexity clinical laboratory testing.  Performed at [facility], [address] [place], [place] [address]   CBC with Differential/Platelet     Status: Abnormal   Collection Time: 08/25/24  8:09 PM  Result Value Ref Range   WBC 6.3 4.0 - 10.5 K/uL   RBC  4.03 3.87 - 5.11 MIL/uL   Hemoglobin 10.7 (L) 12.0 - 15.0 g/dL   HCT 67.2 (L) 63.9 - 53.9 %   MCV 81.1 80.0 - 100.0 fL   MCH 26.6 26.0 - 34.0 pg   MCHC 32.7 30.0 - 36.0 g/dL   RDW 86.4 88.4 - 84.4 %   Platelets 262 150 - 400 K/uL   nRBC 0.0 0.0 - 0.2 %   Neutrophils Relative % 66 %   Neutro Abs 4.2 1.7 - 7.7 K/uL   Lymphocytes Relative 22 %   Lymphs Abs 1.4 0.7 - 4.0 K/uL   Monocytes Relative 8 %   Monocytes Absolute 0.5 0.1 - 1.0 K/uL   Eosinophils Relative 3 %   Eosinophils Absolute 0.2 0.0 - 0.5 K/uL   Basophils Relative 1 %   Basophils Absolute 0.0 0.0 - 0.1 K/uL   Immature Granulocytes 0 %   Abs Immature Granulocytes 0.02 0.00 - 0.07 K/uL    Comment: Performed at [facility], [address], [place], [place] [address]  Blood gas, venous     Status: Abnormal   Collection Time: 08/25/24  8:09 PM  Result Value Ref Range   pH, Ven 7.39 7.25 - 7.43   pCO2, Ven 46 44 - 60 mmHg   pO2, Ven <31 (LL) 32 - 45 mmHg    Comment: CRITICAL RESULT CALLED TO, READ BACK BY AND VERIFIED WITH: ROCKIE POUR RN @ 2037 08/25/24 CAL    Bicarbonate 27.8 20.0 - 28.0 mmol/L   Acid-Base Excess 2.4 (H) 0.0 - 2.0 mmol/L   O2 Saturation 41.5 %   Patient temperature 37.0     Comment: Performed at [facility], [address] [place], [place] [address]  Magnesium      Status: None   Collection Time: 08/25/24  8:09 PM  Result Value Ref Range   Magnesium  1.8 1.7 - 2.4 mg/dL    Comment: Performed at [facility], [address], [place], [place] [address]  Phosphorus     Status: None   Collection Time: 08/25/24  8:09 PM  Result Value Ref Range   Phosphorus  3.0 2.5 - 4.6 mg/dL    Comment: Performed at [facility], [address], [place], [place] [address]  Hemoglobin A1c     Status: Abnormal   Collection Time: 08/25/24  8:09 PM  Result Value Ref Range   Hgb A1c MFr Bld 7.9 (H) 4.8 - 5.6 %    Comment: (NOTE) Diagnosis of Diabetes The following HbA1c ranges recommended by the American Diabetes Association (ADA) may be used as an aid in the diagnosis of diabetes mellitus.  Hemoglobin             Suggested A1C NGSP%              Diagnosis  <5.7                   Non Diabetic  5.7-6.4                Pre-Diabetic  >6.4                   Diabetic  <7.0                   Glycemic control for                       adults with diabetes.  Mean Plasma Glucose 180.03 mg/dL    Comment: Performed at [facility], [address] [place], [place] [address]  Lactic acid, plasma     Status: None   Collection Time: 08/25/24  8:09 PM  Result Value Ref Range   Lactic Acid, Venous 1.9 0.5 - 1.9 mmol/L    Comment: Performed at [facility], [address] [place], [place] [address]  Hepatic function panel     Status: Abnormal   Collection Time: 08/25/24  8:09 PM  Result Value Ref Range   Total Protein 6.1 (L) 6.5 - 8.1 g/dL   Albumin  3.3 (L) 3.5 - 5.0 g/dL   AST 15 15 - 41 U/L   ALT 9 0 - 44 U/L   Alkaline Phosphatase 92 38 - 126 U/L   Total Bilirubin 0.5 0.0 - 1.2 mg/dL   Bilirubin, Direct 0.3 (H) 0.0 - 0.2 mg/dL   Indirect Bilirubin 0.2 (L) 0.3 - 0.9 mg/dL    Comment: Performed at [facility], [address] [place], [place] [address]  Type and screen [facility]     Status: None   Collection Time: 08/25/24  8:10 PM  Result Value Ref Range   ABO/RH(D) O POS    Antibody Screen NEG    Sample Expiration      08/28/2024,2359 Performed at [facility], [address], [place], [place] [address]   Glucose, capillary     Status: Abnormal    Collection Time: 08/25/24  8:25 PM  Result Value Ref Range   Glucose-Capillary 168 (H) 70 - 99 mg/dL    Comment: Glucose reference range applies only to samples taken after fasting for at least 8 hours.  Glucose, capillary     Status: Abnormal   Collection Time: 08/25/24  9:26 PM  Result Value Ref Range   Glucose-Capillary 157 (H) 70 - 99 mg/dL    Comment: Glucose reference range applies only to samples taken after fasting for at least 8 hours.  Glucose, capillary     Status: Abnormal   Collection Time: 08/25/24  9:50 PM  Result Value Ref Range   Glucose-Capillary 144 (H) 70 - 99 mg/dL    Comment: Glucose reference range applies only to samples taken after fasting for at least 8 hours.  Lactic acid, plasma     Status: None   Collection Time: 08/25/24 10:28 PM  Result Value Ref Range   Lactic Acid, Venous 1.8 0.5 - 1.9 mmol/L    Comment: Performed at [facility], [address], [place], [place] [address]  Glucose, capillary     Status: Abnormal   Collection Time: 08/25/24 11:49 PM  Result Value Ref Range   Glucose-Capillary 160 (H) 70 - 99 mg/dL    Comment: Glucose reference range applies only to samples taken after fasting for at least 8 hours.  Glucose, capillary     Status: Abnormal   Collection Time: 08/26/24  1:50 AM  Result Value Ref Range   Glucose-Capillary 120 (H) 70 - 99 mg/dL    Comment: Glucose reference range applies only to samples taken after fasting for at least 8 hours.  Glucose, capillary     Status: Abnormal   Collection Time: 08/26/24  7:28 AM  Result Value Ref Range   Glucose-Capillary 217 (H) 70 - 99 mg/dL    Comment: Glucose reference range applies only to samples taken after fasting for at least 8 hours.  Glucose, capillary  Status: Abnormal   Collection Time: 08/26/24 11:36 AM  Result Value Ref Range   Glucose-Capillary 304 (H) 70 - 99 mg/dL    Comment: Glucose reference range applies only to samples taken after fasting for at  least 8 hours.  Glucose, capillary     Status: Abnormal   Collection Time: 08/26/24  5:26 PM  Result Value Ref Range   Glucose-Capillary 273 (H) 70 - 99 mg/dL    Comment: Glucose reference range applies only to samples taken after fasting for at least 8 hours.  Glucose, capillary     Status: Abnormal   Collection Time: 08/26/24  9:53 PM  Result Value Ref Range   Glucose-Capillary 232 (H) 70 - 99 mg/dL    Comment: Glucose reference range applies only to samples taken after fasting for at least 8 hours.  Glucose, capillary     Status: Abnormal   Collection Time: 08/27/24  7:42 AM  Result Value Ref Range   Glucose-Capillary 208 (H) 70 - 99 mg/dL    Comment: Glucose reference range applies only to samples taken after fasting for at least 8 hours.    DG HIP UNILAT WITH PELVIS 1V LEFT Result Date: 08/26/2024 CLINICAL DATA:  Pain. EXAM: DG HIP (WITH OR WITHOUT PELVIS) 1V*L* COMPARISON:  None Available. FINDINGS: No evidence for an acute fracture. SI joints and symphysis pubis unremarkable. AP view of the left hip shows total hip arthroplasty without complicating features. IMPRESSION: 1. No acute bony findings. 2. Left total hip arthroplasty without complicating features. Electronically Signed   By: Camellia Candle M.D.   On: 08/26/2024 08:25   US  Venous Img Lower Unilateral Left Result Date: 08/25/2024 CLINICAL DATA:  73 year old female with left lower extremity pain. History of prior deep vein thrombosis. EXAM: LEFT LOWER EXTREMITY VENOUS DOPPLER ULTRASOUND TECHNIQUE: Gray-scale sonography with graded compression, as well as color Doppler and duplex ultrasound were performed to evaluate the left lower extremity deep venous systems from the level of the common femoral vein and including the common femoral, femoral, profunda femoral, popliteal and calf veins including the posterior tibial, peroneal and gastrocnemius veins when visible. Spectral Doppler was utilized to evaluate flow at rest and with  distal augmentation maneuvers in the common femoral, femoral and popliteal veins. The contralateral common femoral vein was also evaluated for comparison. COMPARISON:  None Available. FINDINGS: LEFT LOWER EXTREMITY Common Femoral Vein: Small focal echogenic, wall adherent, chronic appearing thrombus is present. No evidence of acute thrombus. Normal compressibility, respiratory phasicity and response to augmentation. Central Greater Saphenous Vein: No evidence of thrombus. Normal compressibility and flow on color Doppler imaging. Central Profunda Femoral Vein: No evidence of thrombus. Normal compressibility and flow on color Doppler imaging. Femoral Vein: No evidence of thrombus. Normal compressibility, respiratory phasicity and response to augmentation. Popliteal Vein: No evidence of thrombus. Normal compressibility, respiratory phasicity and response to augmentation. Calf Veins: No evidence of thrombus. Normal compressibility and flow on color Doppler imaging. Other Findings:  None. RIGHT LOWER EXTREMITY Common Femoral Vein: No evidence of thrombus. Normal compressibility, respiratory phasicity and response to augmentation. IMPRESSION: No evidence of acute left lower extremity deep venous thrombosis. Small focal synechaie about the common femoral vein, likely sequela prior deep vein thrombosis. Ester Sides, MD [facility] Electronically Signed   By: Ester Sides M.D.   On: 08/25/2024 15:25    ROS ROS: I have reviewed the patient's review of systems thoroughly and there are no positive responses as relates to  the HPI.  Blood pressure (!) 148/69, pulse 84, temperature 98.3 F (36.8 C), temperature source Oral, resp. rate 18, height 5' 2 (1.575 m), weight 86.2 kg, SpO2 95%. Physical Exam Well-developed well-nourished patient in no acute distress. Alert and oriented x3 HEENT:within normal limits Cardiac: Regular rate and rhythm Pulmonary: Lungs  clear to auscultation Abdomen: Soft and nontender.  Normal active bowel sounds  Musculoskeletal: (Left hip has minimal pain through range of motion.  There is mild soft tissue swelling of the hip area.  She is neurovascular intact distally.  Wound is without evidence of erythema)   X-ray: I reviewed plain x-rays which show no significant abnormality in her total hip replacement appears to be in excellent position without complicating features. Assessment/Plan: 74 year old female status post total hip replacement readmitted for diabetic ketoacidosis and significant left hip pain.//At this point I think there is no intervention that needs to be made.  The patient does need to continue formalized physical therapy.  She can restart physical therapy at our office Once she is discharged.  We are happy to see her at any point should issues arise.  She can follow-up in a regular scheduled appointment although given that I have seen her x-ray and wound I think she could wait one additional week to see us  which would be fine.  Happy to provide any additional input should needs arise.  Norleen LITTIE Gavel 08/27/2024, 8:04 AM        [1]  Allergies Allergen Reactions   Dilaudid  [Hydromorphone  Hcl] Other (See Comments)    Hallucinations/ argumentative, goes beserk   Heparin  Other (See Comments)    HIT ab positive  (SRA negative 11/25/17)    Ambien  [Zolpidem  Tartrate] Other (See Comments)    Up sleep walking and eating   Belsomra [Suvorexant]     ineffective Other reaction(s): Other ineffective   Codeine Nausea And Vomiting   Morphine  And Codeine Other (See Comments)    Flushing and feeling hot Tolerates with Diphenhydramine    Penicillins Rash and Other (See Comments)    Caused Headaches also Has patient had a PCN reaction causing immediate rash, facial/tongue/throat swelling, SOB or lightheadedness with hypotension: No Has patient had a PCN reaction causing severe rash involving mucus membranes or skin  necrosis: No Has patient had a PCN reaction that required hospitalization No Has patient had a PCN reaction occurring within the last 10 years: No If all of the above answers are NO, then may proceed with Cephalosporin use.     Statins Other (See Comments)    Muscle weakness, cramps and aching all over body   Ceftin [Cefuroxime Axetil] Other (See Comments)    Stomach pain    Elavil [Amitriptyline] Other (See Comments)    Loopy feeling   Lovaza  [Omega-3-Acid  Ethyl Esters (Fish)] Other (See Comments)    Stomach pain    Lunesta [Eszopiclone] Other (See Comments)    Causes dizziness   Lyrica [Pregabalin] Nausea Only   Metformin  And Related Nausea Only

## 2024-08-27 NOTE — Inpatient Diabetes Management (Signed)
 Inpatient Diabetes Program Recommendations  AACE/ADA: New Consensus Statement on Inpatient Glycemic Control (2015)  Target Ranges:  Prepandial:   less than 140 mg/dL      Peak postprandial:   less than 180 mg/dL (1-2 hours)      Critically ill patients:  140 - 180 mg/dL   Lab Results  Component Value Date   GLUCAP 248 (H) 08/27/2024   HGBA1C 7.9 (H) 08/25/2024    Review of Glycemic Control  Diabetes history: DM2 Outpatient Diabetes medications: [employer] Insulin  pump with sensor Current orders for Inpatient glycemic control: Lantus  25 units at , Novolog  0-15 TID with meals and 0-5 HS + 3 units TID   HgbA1C - 7.4% For discharge today  Inpatient Diabetes Program Recommendations:    Pt has pump supplies at bedside, although pt does not apply the pump and sensor herself. States her husband or daughter-in-law applies them and she manages the pump. Diabetes Coordinators do not start insulin  pumps on pts, needs to be able to self apply pump and sensor. Pt states her husband applies it most of the time at home. Son states she will have pump on by dinner tonight.   Blood sugar 248 before lunch and received * units of Novolog  at noon. Eating 100%.  Has appt in February to f/u with Endo NP.   Answered all questions/concerns.   Thank you. Shona Brandy, RD, LDN, [facility] Inpatient Diabetes Coordinator [phone]

## 2024-08-27 NOTE — Final Progress Note (Signed)
 IV removed. AVS reviewed with patient and son whom verbalized understanding and denies further needs.

## 2024-08-27 NOTE — Plan of Care (Signed)
   Problem: Education: Goal: Knowledge of General Education information will improve Description: Including pain rating scale, medication(s)/side effects and non-pharmacologic comfort measures Outcome: Progressing   Problem: Clinical Measurements: Goal: Will remain free from infection Outcome: Progressing   Problem: Activity: Goal: Risk for activity intolerance will decrease Outcome: Progressing

## 2024-08-27 NOTE — Progress Notes (Signed)
 Physical Therapy Treatment Patient Details Name: [name] MRN: [id number] DOB: 1950-12-31 Today's Date: 08/27/2024   History of Present Illness 74 yo female Presented with hyperglycemia and left leg pain.Dx of DKA. s/p L THA, anterior approach on 08/19/2024 due to failure of conservative measures. Pt is currently L LE WBAT with anterior hip precautions-- avoid hyperextension, external rotation and abduction. Pt PMH includes but is not limited to: arthritis, CKD III, mood disorder, CAD, HLD, GERD, depression, anxiety, cerebral thrombosis without infarct, DKA, PAC, adenoma of R colon, anemia, diastolic dysfunction, SOB, angina, dysphagia, DM II, SIRS, s/p TEE conversion, hypothyroidism, HTN, hepatic steatosis, LE DVT, CVA, RLS, t-spine compression fx, ACDF, back surgery, carpal tunnel release, hernia repair, B TKA    PT Comments  Pt supine in bed, reports lack of sleep in hospital, reports anxiousness regarding getting home before snow on Saturday (currently Tuesday). Pt mod ind with bed mobility, completes transfers from bedside and toilet with supv using RW. Pt amb to restroom around room and then 150 ft with RW, supv for safety, step through gait pattern with equal step length, 3 standing rest breaks due to BUE fatigue, noted to be using BUE heavily on RW with gait despite cues. Offered written/illustrated HEP but pt reports knowledge of them from previous hospitalization. Pt reports 6/10 pain at beginning of session, with amb, and at end. Pt hopeful to amb more today and d/c home; notified NT and mobility specialist. Recommend resume OPPT at d/c and continued family support as pt reports spouse and 2 sons available as needed.   If plan is discharge home, recommend the following: A little help with bathing/dressing/bathroom;A little help with walking and/or transfers;Assistance with cooking/housework;Help with stairs or ramp for entrance   Can travel by private vehicle        Equipment  Recommendations  None recommended by PT    Recommendations for Other Services       Precautions / Restrictions Precautions Precautions: Fall Precaution/Restrictions Comments: Per ortho note 1/13: NO hip precautions Restrictions Weight Bearing Restrictions Per Provider Order: No LLE Weight Bearing Per Provider Order: Weight bearing as tolerated     Mobility  Bed Mobility Overal bed mobility: Modified Independent       Supine to sit: Modified independent (Device/Increase time), Used rails, HOB elevated     General bed mobility comments: using bedrail to assist as needed, increased time    Transfers Overall transfer level: Needs assistance Equipment used: Rolling walker (2 wheels) Transfers: Sit to/from Stand Sit to Stand: Supervision           General transfer comment: powering up with BUE assisting from bedside, using RW to power up from toilet, supv for safety    Ambulation/Gait Ambulation/Gait assistance: Supervision Gait Distance (Feet): 150 Feet Assistive device: Rolling walker (2 wheels) Gait Pattern/deviations: Step-through pattern, Decreased stride length Gait velocity: functional     General Gait Details: step through gait pattern, good bil foot clearance wtih equal bil step length, increased BUE weightbearing on RW, 3 standing rest breaks due to bil arm fatigue, denies increase in pain wtih ambulation, no LE buckling noted   Stairs             Wheelchair Mobility     Tilt Bed    Modified Rankin (Stroke Patients Only)       Balance Overall balance assessment: Needs assistance   Sitting balance-Leahy Scale: Good     Standing balance support: Bilateral upper extremity supported, Reliant on assistive device  for balance, During functional activity Standing balance-Leahy Scale: Fair Standing balance comment: able to release BUE to wash hands, RW for ambulation                            Communication  Communication Communication: No apparent difficulties  Cognition Arousal: Alert Behavior During Therapy: WFL for tasks assessed/performed, Anxious   PT - Cognitive impairments: No apparent impairments                       PT - Cognition Comments: pt verbalizes anxiousness regarding getting home before the snow storm forecasted for this weekend (current date Tues and forecasted for Sat), offered encouragement and relaxation with fair carryover Following commands: Intact      Cueing    Exercises Total Joint Exercises Ankle Circles/Pumps: AROM, Both, 10 reps, Supine Heel Slides: AROM, Left, 5 reps, Supine    General Comments        Pertinent Vitals/Pain Pain Assessment Pain Assessment: 0-10 Pain Score: 6  Pain Location: L hip Pain Descriptors / Indicators: Burning Pain Intervention(s): Limited activity within patient's tolerance, Monitored during session, Repositioned, Ice applied    Home Living                          Prior Function            PT Goals (current goals can now be found in the care plan section) Acute Rehab PT Goals Patient Stated Goal: go thrifting with 73 year old granddaughter PT Goal Formulation: With patient Time For Goal Achievement: 09/09/24 Potential to Achieve Goals: Good Progress towards PT goals: Progressing toward goals    Frequency    Min 3X/week      PT Plan      Co-evaluation              AM-PAC PT 6 Clicks Mobility   Outcome Measure  Help needed turning from your back to your side while in a flat bed without using bedrails?: None Help needed moving from lying on your back to sitting on the side of a flat bed without using bedrails?: A Little Help needed moving to and from a bed to a chair (including a wheelchair)?: None Help needed standing up from a chair using your arms (e.g., wheelchair or bedside chair)?: None Help needed to walk in hospital room?: None Help needed climbing 3-5 steps with a  railing? : A Little 6 Click Score: 22    End of Session Equipment Utilized During Treatment: Gait belt Activity Tolerance: Patient tolerated treatment well Patient left: with call bell/phone within reach;in chair;with nursing/sitter in room;with chair alarm set Nurse Communication: Mobility status PT Visit Diagnosis: Other abnormalities of gait and mobility (R26.89);Muscle weakness (generalized) (M62.81)     Time: 8891-8869 PT Time Calculation (min) (ACUTE ONLY): 22 min  Charges:    $Gait Training: 8-22 mins PT General Charges $$ ACUTE PT VISIT: 1 Visit                     Tori Jolynne Spurgin PT, DPT 08/27/24, 11:39 AM

## 2024-08-27 NOTE — Progress Notes (Signed)
 Discharge meds in a secure bag delivered to patient by this RN

## 2024-08-27 NOTE — Plan of Care (Signed)
 Problem: Education: Goal: Knowledge of General Education information will improve Description: Including pain rating scale, medication(s)/side effects and non-pharmacologic comfort measures Outcome: Adequate for Discharge   Problem: Health Behavior/Discharge Planning: Goal: Ability to manage health-related needs will improve Outcome: Adequate for Discharge   Problem: Clinical Measurements: Goal: Ability to maintain clinical measurements within normal limits will improve Outcome: Adequate for Discharge Goal: Will remain free from infection Outcome: Adequate for Discharge Goal: Diagnostic test results will improve Outcome: Adequate for Discharge Goal: Respiratory complications will improve Outcome: Adequate for Discharge Goal: Cardiovascular complication will be avoided Outcome: Adequate for Discharge   Problem: Activity: Goal: Risk for activity intolerance will decrease Outcome: Adequate for Discharge   Problem: Nutrition: Goal: Adequate nutrition will be maintained Outcome: Adequate for Discharge   Problem: Coping: Goal: Level of anxiety will decrease Outcome: Adequate for Discharge   Problem: Elimination: Goal: Will not experience complications related to bowel motility Outcome: Adequate for Discharge Goal: Will not experience complications related to urinary retention Outcome: Adequate for Discharge   Problem: Pain Managment: Goal: General experience of comfort will improve and/or be controlled Outcome: Adequate for Discharge   Problem: Safety: Goal: Ability to remain free from injury will improve Outcome: Adequate for Discharge   Problem: Skin Integrity: Goal: Risk for impaired skin integrity will decrease Outcome: Adequate for Discharge   Problem: Education: Goal: Ability to describe self-care measures that may prevent or decrease complications (Diabetes Survival Skills Education) will improve Outcome: Adequate for Discharge Goal: Individualized Educational  Video(s) Outcome: Adequate for Discharge   Problem: Coping: Goal: Ability to adjust to condition or change in health will improve Outcome: Adequate for Discharge   Problem: Fluid Volume: Goal: Ability to maintain a balanced intake and output will improve Outcome: Adequate for Discharge   Problem: Health Behavior/Discharge Planning: Goal: Ability to identify and utilize available resources and services will improve Outcome: Adequate for Discharge Goal: Ability to manage health-related needs will improve Outcome: Adequate for Discharge   Problem: Metabolic: Goal: Ability to maintain appropriate glucose levels will improve Outcome: Adequate for Discharge   Problem: Nutritional: Goal: Maintenance of adequate nutrition will improve Outcome: Adequate for Discharge Goal: Progress toward achieving an optimal weight will improve Outcome: Adequate for Discharge   Problem: Skin Integrity: Goal: Risk for impaired skin integrity will decrease Outcome: Adequate for Discharge   Problem: Tissue Perfusion: Goal: Adequacy of tissue perfusion will improve Outcome: Adequate for Discharge   Problem: Education: Goal: Ability to describe self-care measures that may prevent or decrease complications (Diabetes Survival Skills Education) will improve Outcome: Adequate for Discharge Goal: Individualized Educational Video(s) Outcome: Adequate for Discharge   Problem: Cardiac: Goal: Ability to maintain an adequate cardiac output will improve Outcome: Adequate for Discharge   Problem: Health Behavior/Discharge Planning: Goal: Ability to identify and utilize available resources and services will improve Outcome: Adequate for Discharge Goal: Ability to manage health-related needs will improve Outcome: Adequate for Discharge   Problem: Fluid Volume: Goal: Ability to achieve a balanced intake and output will improve Outcome: Adequate for Discharge   Problem: Metabolic: Goal: Ability to maintain  appropriate glucose levels will improve Outcome: Adequate for Discharge   Problem: Nutritional: Goal: Maintenance of adequate nutrition will improve Outcome: Adequate for Discharge Goal: Maintenance of adequate weight for body size and type will improve Outcome: Adequate for Discharge   Problem: Respiratory: Goal: Will regain and/or maintain adequate ventilation Outcome: Adequate for Discharge   Problem: Urinary Elimination: Goal: Ability to achieve and maintain adequate renal perfusion and  functioning will improve Outcome: Adequate for Discharge

## 2024-08-27 NOTE — Discharge Summary (Signed)
 "  Physician Discharge Summary  [name] FMW:[id number] DOB: [birth year] DOA: 08/25/2024  PCP: [name], MD  Admit date: 08/25/2024 Discharge date: 08/27/2024  Admitted From: home Disposition:  home  Recommendations for Outpatient Follow-up:  Follow up with PCP in 1-2 weeks Follow-up with Dr. Yvone with orthopedic surgery as scheduled  Home Health: none Equipment/Devices: none  Discharge Condition: stable CODE STATUS: Full code Diet Orders (From admission, onward)     Start     Ordered   08/25/24 2359  Diet Carb Modified Room service appropriate? Yes  Diet effective now       Question Answer Comment  Diet-HS Snack? Nothing   Calorie Level Medium 1600-2000   Fluid consistency: Thin   Room service appropriate? Yes      08/25/24 2358            Brief Narrative / Interim history: 74 year old female with DM1 on insulin  pump, hypertension, hypothyroidism comes into the hospital with left hip/leg pain as well as high blood sugars.  She underwent a left hip replacement about a week ago, tells me she did well initially, worked with physical therapy when they came home but over the last several days has been experiencing severe left hip and leg pain.  She also noticed that, at the same time, her sugars have been really high and unable to be controlled.  She decided to come to the emergency room where she was found to have mild DKA and started on insulin  drip  Hospital Course / Discharge diagnoses: Principal problem DM1, mild DKA -patient was initially admitted to stepdown, placed on insulin  infusion.  With insulin  infusion her labs have improved, she was transitioned to subcutaneous insulin  and her diet was advanced.  It appears that her pump's battery was dead and may not have been working appropriately at home.  Diabetes coordinator consulted and also followed patient while hospitalized.  Family was able to bring her replacement battery, she had her pump placed again,  sugars are in the acceptable, she is tolerating a regular diet, and will be discharged home in stable condition.  Of note, after the initial night she has refused further blood work   Active problems Status post left hip replacement - 7 days ago, she has been experiencing worsening pain at home.  Plain films without any complicating features.  She was able to work well with physical therapy today, Dr. Yvone evaluated patient as well.  Her pain has been poorly controlled, better now on a combination of Percocet and baclofen , will prescribe a short course on discharge CAD-continue Crestor , continue aspirin .  No chest pain CKD 3A-creatinine appears at baseline.  She is refusing further blood work Hyperlipidemia-continue Crestor  Hypothyroidism-continue Synthroid , last TSH about 6 years ago, would have like to recheck a TSH but she is refusing blood work.  She does not have any particular symptoms related to this, defer to outpatient Anemia-of chronic disease, hemoglobin 10.7 Essential hypertension-continue home regimen on discharge  Sepsis ruled out   Discharge Instructions   Allergies as of 08/27/2024       Reactions   Dilaudid  [hydromorphone  Hcl] Other (See Comments)   Hallucinations/ argumentative, goes beserk   Heparin  Other (See Comments)   HIT ab positive  (SRA negative 11/25/17)    Ambien  [zolpidem  Tartrate] Other (See Comments)   Up sleep walking and eating   Belsomra [suvorexant]    ineffective Other reaction(s): Other ineffective   Codeine Nausea And Vomiting   Morphine  And Codeine Other (  See Comments)   Flushing and feeling hot Tolerates with Diphenhydramine    Penicillins Rash, Other (See Comments)   Caused Headaches also Has patient had a PCN reaction causing immediate rash, facial/tongue/throat swelling, SOB or lightheadedness with hypotension: No Has patient had a PCN reaction causing severe rash involving mucus membranes or skin necrosis: No Has patient had a PCN  reaction that required hospitalization No Has patient had a PCN reaction occurring within the last 10 years: No If all of the above answers are NO, then may proceed with Cephalosporin use.   Statins Other (See Comments)   Muscle weakness, cramps and aching all over body   Ceftin [cefuroxime Axetil] Other (See Comments)   Stomach pain    Elavil [amitriptyline] Other (See Comments)   Loopy feeling   Lovaza  [omega-3-acid  Ethyl Esters (fish)] Other (See Comments)   Stomach pain    Lunesta [eszopiclone] Other (See Comments)   Causes dizziness   Lyrica [pregabalin] Nausea Only   Metformin  And Related Nausea Only        Medication List     STOP taking these medications    methocarbamol  750 MG tablet Commonly known as: ROBAXIN    tiZANidine  4 MG tablet Commonly known as: ZANAFLEX        TAKE these medications    acetaminophen  500 MG tablet Commonly known as: TYLENOL  Take 1,000 mg by mouth every 8 (eight) hours as needed for mild pain or headache.   amLODipine  2.5 MG tablet Commonly known as: NORVASC  Take 2 tablets (5 mg total) by mouth daily. What changed: how much to take   aspirin  EC 81 MG tablet Take 243 mg by mouth in the morning and at bedtime. **Takes this way until 09/16/2024 then go back to 1 tablet daily.   baclofen  10 MG tablet Commonly known as: LIORESAL  Take 1 tablet (10 mg total) by mouth 3 (three) times daily as needed for muscle spasms.   cetirizine 10 MG tablet Commonly known as: ZYRTEC Take 10 mg by mouth daily.   Clobetasol  Propionate 0.05 % external spray Commonly known as: TEMOVATE  Apply 1 spray topically at bedtime as needed (rosacea).   cyanocobalamin  1000 MCG tablet Commonly known as: VITAMIN B12 Take 1,000 mcg by mouth in the morning.   docusate sodium  100 MG capsule Commonly known as: Colace Take 1 capsule (100 mg total) by mouth 2 (two) times daily.   doxycycline  50 MG capsule Commonly known as: VIBRAMYCIN  Take 50 mg by mouth  daily.   escitalopram  5 MG tablet Commonly known as: LEXAPRO  Take 5 mg by mouth daily as needed (anxiety).   Fluocinolone Acetonide 0.01 % Oil Place 5 drops in ear(s) daily as needed (rosacea).   gabapentin  300 MG capsule Commonly known as: NEURONTIN  Take 900 mg by mouth at bedtime.   HumaLOG  100 UNIT/ML injection Generic drug: insulin  lispro Inject 0-110 Units into the skin continuous as needed for high blood sugar (via pump).   imipramine  50 MG tablet Commonly known as: Tofranil  Take 1 tablet (50 mg total) by mouth daily. What changed: when to take this   irbesartan -hydrochlorothiazide  300-12.5 MG tablet Commonly known as: AVALIDE Take 1 tablet by mouth in the morning.   levothyroxine  150 MCG tablet Commonly known as: SYNTHROID  Take 150 mcg by mouth daily before breakfast.   LORazepam  0.5 MG tablet Commonly known as: ATIVAN  Take 0.5-1 mg by mouth every 8 (eight) hours as needed for anxiety.   Magnesium  250 MG Tabs Take 250 mg by mouth in the morning.  metoprolol  succinate 25 MG 24 hr tablet Commonly known as: TOPROL -XL TAKE 1 TABLET (25 MG TOTAL) BY MOUTH DAILY.   metroNIDAZOLE 0.75 % cream Commonly known as: METROCREAM Apply 1 Application topically 2 (two) times daily.   omeprazole 40 MG capsule Commonly known as: PRILOSEC Take 40 mg by mouth daily.   oxyCODONE -acetaminophen  5-325 MG tablet Commonly known as: PERCOCET/ROXICET Take 1-2 tablets by mouth every 4 (four) hours as needed for severe pain (pain score 7-10) or moderate pain (pain score 4-6). What changed:  how much to take reasons to take this   Potassium 99 MG Tabs Take 99 mg by mouth in the morning.   PROBIOTIC PO Take 1 capsule by mouth in the morning.   promethazine  25 MG tablet Commonly known as: PHENERGAN  Take 25 mg by mouth every 8 (eight) hours as needed for nausea or vomiting.   rosuvastatin  20 MG tablet Commonly known as: CRESTOR  Take 20 mg by mouth in the morning.   Vitamin  D3 125 MCG (5000 UT) Tabs Take 5,000 Units by mouth in the morning.   zinc  sulfate (50mg  elemental zinc ) 220 (50 Zn) MG capsule Take 220 mg by mouth in the morning.       Consultations: Orthopedic surgery  Procedures/Studies:  DG HIP UNILAT WITH PELVIS 1V LEFT Result Date: 08/26/2024 CLINICAL DATA:  Pain. EXAM: DG HIP (WITH OR WITHOUT PELVIS) 1V*L* COMPARISON:  None Available. FINDINGS: No evidence for an acute fracture. SI joints and symphysis pubis unremarkable. AP view of the left hip shows total hip arthroplasty without complicating features. IMPRESSION: 1. No acute bony findings. 2. Left total hip arthroplasty without complicating features. Electronically Signed   By: Camellia Candle M.D.   On: 08/26/2024 08:25   US  Venous Img Lower Unilateral Left Result Date: 08/25/2024 CLINICAL DATA:  74 year old female with left lower extremity pain. History of prior deep vein thrombosis. EXAM: LEFT LOWER EXTREMITY VENOUS DOPPLER ULTRASOUND TECHNIQUE: Gray-scale sonography with graded compression, as well as color Doppler and duplex ultrasound were performed to evaluate the left lower extremity deep venous systems from the level of the common femoral vein and including the common femoral, femoral, profunda femoral, popliteal and calf veins including the posterior tibial, peroneal and gastrocnemius veins when visible. Spectral Doppler was utilized to evaluate flow at rest and with distal augmentation maneuvers in the common femoral, femoral and popliteal veins. The contralateral common femoral vein was also evaluated for comparison. COMPARISON:  None Available. FINDINGS: LEFT LOWER EXTREMITY Common Femoral Vein: Small focal echogenic, wall adherent, chronic appearing thrombus is present. No evidence of acute thrombus. Normal compressibility, respiratory phasicity and response to augmentation. Central Greater Saphenous Vein: No evidence of thrombus. Normal compressibility and flow on color Doppler imaging.  Central Profunda Femoral Vein: No evidence of thrombus. Normal compressibility and flow on color Doppler imaging. Femoral Vein: No evidence of thrombus. Normal compressibility, respiratory phasicity and response to augmentation. Popliteal Vein: No evidence of thrombus. Normal compressibility, respiratory phasicity and response to augmentation. Calf Veins: No evidence of thrombus. Normal compressibility and flow on color Doppler imaging. Other Findings:  None. RIGHT LOWER EXTREMITY Common Femoral Vein: No evidence of thrombus. Normal compressibility, respiratory phasicity and response to augmentation. IMPRESSION: No evidence of acute left lower extremity deep venous thrombosis. Small focal synechaie about the common femoral vein, likely sequela prior deep vein thrombosis. Ester Sides, MD [facility] Radiology Electronically Signed   By: Ester Sides M.D.   On: 08/25/2024 15:25  MR LUMBAR SPINE WO CONTRAST Result Date: 08/23/2024 EXAM: MRI LUMBAR SPINE 08/14/2024 11:50:31 AM TECHNIQUE: Multiplanar multisequence MRI of the lumbar spine was performed without the administration of intravenous contrast. COMPARISON: MR Lumbar Spine 08/18/2023; 06/15/2021. CLINICAL HISTORY: Spondylolisthesis, lumbar region, history previous surgery. FINDINGS: BONES AND ALIGNMENT: Similar retrolisthesis of T12 on L1 and L1 on L2. Posterior instrumented fusion from L2 to L4. There are interbody spacers at the L2-L3 through L5-S1 levels. Similar mild compression deformity of T12 with anterior height loss and irregularity of the superior endplate. There are prominent degenerative endplate changes anteriorly at T12-L1 and degenerative endplate changes at L1-L2 with associated discogenic edema. No evidence of acute fracture. SPINAL CORD: The conus medullaris extends to the T12-L1 level. SOFT TISSUES: Postoperative changes within the paraspinal soft tissues of the lumbar spine. Similar mild  edema in the paraspinal musculature of the lower lumbar spine. T12-L1: There is retrolisthesis and diffuse disc bulge which indents the ventral thecal sac with lateral recess narrowing left greater than right. Moderate facet arthrosis. Similar mild spinal canal stenosis. Similar mild right foraminal stenosis. Extraforaminal component of disc bulge and lateral osteophytes at T12-L1 which contacts the right T12 nerve root. L1-L2: There is moderate disc height loss. Similar retrolisthesis and diffuse disc bulge contributing to lateral recess narrowing. There is moderate facet arthrosis. Mild prominence of the dorsal epidural fat. Moderate to severe spinal canal stenosis is similar to prior with crowding of the cauda equina and nerve roots. There is similar mild right and moderate left foraminal stenosis. L2-L3: There are postsurgical changes. No significant spinal canal stenosis. No significant foraminal stenosis. L3-L4: There are postsurgical changes. No significant spinal canal or foraminal stenosis. L4-L5: There are postsurgical changes. No significant spinal canal or foraminal stenosis. L5-S1: There are postsurgical changes. Prominence of the epidural fat with tapering of the thecal sac. No significant spinal canal or foraminal stenosis. IMPRESSION: 1. Increased adjacent segment disease at L1-2 with increased degenerative endplate edema. 2. Moderate-to-severe spinal canal stenosis at L1-2, similar to prior. 3. Foraminal stenosis at multiple levels, greatest and moderate on the left at L1-2. 4. Extraforaminal component of disc bulge at T12-L1 contacts the right T12 nerve root. 5. Similar postoperative changes. Electronically signed by: Donnice Mania MD 08/23/2024 09:40 PM EST RP Workstation: [id number]   DG HIP UNILAT WITH PELVIS 1V LEFT Result Date: 08/19/2024 EXAM: FLUOROSCOPIC IMAGING TECHNIQUE: Fluoroscopy was provided by the radiology department for procedure. Radiologist was not present during examination.  RADIATION DOSE INDEX: Fluoroscopy time 15 seconds. Reference Air Kerma: 2 mGy. COMPARISON: None available. CLINICAL HISTORY: [id number] Surgery, elective [id number] Surgery, elective [phone]. FINDINGS: Intraoperative fluoroscopic imaging was performed. Medical history is left hip replacement. Fluoroscopy time 15 seconds. Fluoroscopy dose 2 mGy. IMPRESSION: 1. Intraoperative fluoroscopic imaging as above. Please refer to the operative report for full details. Electronically signed by: Oneil Devonshire MD MD 08/19/2024 11:18 PM EST RP Workstation: [id number] C-Arm 1-60 Min-No Report Result Date: 08/19/2024 Fluoroscopy was utilized by the requesting physician.  No radiographic interpretation.   DG C-Arm 1-60 Min-No Report Result Date: 08/19/2024 Fluoroscopy was utilized by the requesting physician.  No radiographic interpretation.     Subjective: - no chest pain, shortness of breath, no abdominal pain, nausea or vomiting.   Discharge Exam: BP (!) 153/71   Pulse 82   Temp 98.3 F (36.8 C) (Oral)   Resp 18   Ht 5' 2 (1.575 m)   Wt 86.2 kg   SpO2 95%   BMI  34.75 kg/m   General: Pt is alert, awake, not in acute distress Cardiovascular: RRR, S1/S2 +, no rubs, no gallops Respiratory: CTA bilaterally, no wheezing, no rhonchi Abdominal: Soft, NT, ND, bowel sounds + Extremities: no edema, no cyanosis    The results of significant diagnostics from this hospitalization (including imaging, microbiology, ancillary and laboratory) are listed below for reference.     Microbiology: No results found for this or any previous visit (from the past 240 hours).   Labs: Basic Metabolic Panel: Recent Labs  Lab 08/25/24 1236 08/25/24 1318 08/25/24 1538 08/25/24 2009  NA 132* 142 134* 137  K 4.4 2.5* 4.0 3.8  CL 91*  --  97* 100  CO2 19*  --  23 25  GLUCOSE 435*  --  264* 173*  BUN 30*  --  27* 23  CREATININE 1.39*  --  1.17* 1.02*  CALCIUM  10.1  --  9.2 9.2  MG  --   --   --  1.8  PHOS  --   --    --  3.0   Liver Function Tests: Recent Labs  Lab 08/25/24 2009  AST 15  ALT 9  ALKPHOS 92  BILITOT 0.5  PROT 6.1*  ALBUMIN  3.3*   CBC: Recent Labs  Lab 08/25/24 1236 08/25/24 1318 08/25/24 2009  WBC 7.6  --  6.3  NEUTROABS  --   --  4.2  HGB 10.9* 6.8* 10.7*  HCT 32.4* 20.0* 32.7*  MCV 80.4  --  81.1  PLT 319  --  262   CBG: Recent Labs  Lab 08/26/24 1136 08/26/24 1726 08/26/24 2153 08/27/24 0742 08/27/24 1134  GLUCAP 304* 273* 232* 208* 248*   Hgb A1c Recent Labs    08/25/24 2009  HGBA1C 7.9*   Lipid Profile No results for input(s): CHOL, HDL, LDLCALC, TRIG, CHOLHDL, LDLDIRECT in the last 72 hours. Thyroid  function studies No results for input(s): TSH, T4TOTAL, T3FREE, THYROIDAB in the last 72 hours.  Invalid input(s): FREET3 Urinalysis    Component Value Date/Time   COLORURINE YELLOW 08/25/2024 1236   APPEARANCEUR CLEAR 08/25/2024 1236   LABSPEC 1.023 08/25/2024 1236   PHURINE 5.0 08/25/2024 1236   GLUCOSEU >1,000 (A) 08/25/2024 1236   HGBUR NEGATIVE 08/25/2024 1236   BILIRUBINUR NEGATIVE 08/25/2024 1236   KETONESUR 40 (A) 08/25/2024 1236   PROTEINUR NEGATIVE 08/25/2024 1236   UROBILINOGEN 0.2 07/30/2012 2228   NITRITE NEGATIVE 08/25/2024 1236   LEUKOCYTESUR MODERATE (A) 08/25/2024 1236    FURTHER DISCHARGE INSTRUCTIONS:   Get Medicines reviewed and adjusted: Please take all your medications with you for your next visit with your Primary MD   Laboratory/radiological data: Please request your Primary MD to go over all hospital tests and procedure/radiological results at the follow up, please ask your Primary MD to get all Hospital records sent to his/her office.   In some cases, they will be blood work, cultures and biopsy results pending at the time of your discharge. Please request that your primary care M.D. goes through all the records of your hospital data and follows up on these results.   Also Note the  following: If you experience worsening of your admission symptoms, develop shortness of breath, life threatening emergency, suicidal or homicidal thoughts you must seek medical attention immediately by calling 911 or calling your MD immediately  if symptoms less severe.   You must read complete instructions/literature along with all the possible adverse reactions/side effects for all the Medicines you take and that  have been prescribed to you. Take any new Medicines after you have completely understood and accpet all the possible adverse reactions/side effects.    Do not drive when taking Pain medications or sleeping medications (Benzodaizepines)   Do not take more than prescribed Pain, Sleep and Anxiety Medications. It is not advisable to combine anxiety,sleep and pain medications without talking with your primary care practitioner   Special Instructions: If you have smoked or chewed Tobacco  in the last 2 yrs please stop smoking, stop any regular Alcohol  and or any Recreational drug use.   Wear Seat belts while driving.   Please note: You were cared for by a hospitalist during your hospital stay. Once you are discharged, your primary care physician will handle any further medical issues. Please note that NO REFILLS for any discharge medications will be authorized once you are discharged, as it is imperative that you return to your primary care physician (or establish a relationship with a primary care physician if you do not have one) for your post hospital discharge needs so that they can reassess your need for medications and monitor your lab values.  Time coordinating discharge: 35 minutes  SIGNED:  Nilda Fendt, MD, PhD 08/27/2024, 1:02 PM   "

## 2024-08-31 LAB — BLOOD GAS, VENOUS
Acid-Base Excess: 2.4 mmol/L — ABNORMAL HIGH (ref 0.0–2.0)
Bicarbonate: 27.8 mmol/L (ref 20.0–28.0)
Drawn by: 64037
O2 Saturation: 41.5 %
Patient temperature: 37
pCO2, Ven: 46 mmHg (ref 44–60)
pH, Ven: 7.39 (ref 7.25–7.43)
pO2, Ven: <31 mmHg — CL (ref 32–45)

## 2024-09-12 LAB — I-STAT VENOUS BLOOD GAS, ED
Acid-base deficit: 13 mmol/L — ABNORMAL HIGH (ref 0.0–2.0)
Bicarbonate: 11.1 mmol/L — ABNORMAL LOW (ref 20.0–28.0)
Calcium, Ion: 0.94 mmol/L — ABNORMAL LOW (ref 1.15–1.40)
HCT: 20 % — ABNORMAL LOW (ref 36.0–46.0)
Hemoglobin: 6.8 g/dL — CL (ref 12.0–15.0)
O2 Saturation: 33 %
Patient temperature: 97.6
Potassium: 2.5 mmol/L — CL (ref 3.5–5.1)
Sodium: 142 mmol/L (ref 135–145)
TCO2: 12 mmol/L — ABNORMAL LOW (ref 22–32)
pCO2, Ven: 17.9 mmHg — CL (ref 44–60)
pH, Ven: 7.399 (ref 7.25–7.43)
pO2, Ven: 19 mmHg — CL (ref 32–45)

## 2025-01-21 ENCOUNTER — Ambulatory Visit: Admitting: Family Medicine
